# Patient Record
Sex: Male | Born: 1945
Health system: Southern US, Community
[De-identification: ages and names within clinical notes are randomized; demographics above are authoritative.]

## PROBLEM LIST (undated history)

## (undated) DIAGNOSIS — G4733 Obstructive sleep apnea (adult) (pediatric): Secondary | ICD-10-CM

## (undated) DIAGNOSIS — I480 Paroxysmal atrial fibrillation: Secondary | ICD-10-CM

## (undated) DIAGNOSIS — I340 Nonrheumatic mitral (valve) insufficiency: Secondary | ICD-10-CM

## (undated) DIAGNOSIS — I4821 Permanent atrial fibrillation: Secondary | ICD-10-CM

## (undated) DIAGNOSIS — I700 Atherosclerosis of aorta: Secondary | ICD-10-CM

## (undated) DIAGNOSIS — I428 Other cardiomyopathies: Secondary | ICD-10-CM

## (undated) DIAGNOSIS — I5042 Chronic combined systolic (congestive) and diastolic (congestive) heart failure: Secondary | ICD-10-CM

## (undated) DIAGNOSIS — I10 Essential (primary) hypertension: Secondary | ICD-10-CM

## (undated) DIAGNOSIS — I071 Rheumatic tricuspid insufficiency: Secondary | ICD-10-CM

## (undated) DIAGNOSIS — R569 Unspecified convulsions: Secondary | ICD-10-CM

## (undated) DIAGNOSIS — E785 Hyperlipidemia, unspecified: Secondary | ICD-10-CM

## (undated) DIAGNOSIS — I77810 Thoracic aortic ectasia: Secondary | ICD-10-CM

## (undated) DIAGNOSIS — Z9989 Dependence on other enabling machines and devices: Secondary | ICD-10-CM

## (undated) DIAGNOSIS — J189 Pneumonia, unspecified organism: Secondary | ICD-10-CM

## (undated) DIAGNOSIS — I4510 Unspecified right bundle-branch block: Secondary | ICD-10-CM

## (undated) DIAGNOSIS — Z9889 Other specified postprocedural states: Secondary | ICD-10-CM

## (undated) DIAGNOSIS — I219 Acute myocardial infarction, unspecified: Secondary | ICD-10-CM

## (undated) DIAGNOSIS — I5022 Chronic systolic (congestive) heart failure: Secondary | ICD-10-CM

## (undated) DIAGNOSIS — I429 Cardiomyopathy, unspecified: Secondary | ICD-10-CM

## (undated) HISTORY — PX: REPLACEMENT TOTAL KNEE: SUR1224

## (undated) HISTORY — PX: TONSILLECTOMY AND ADENOIDECTOMY: SUR1326

## (undated) HISTORY — PX: JOINT REPLACEMENT: SHX530

## (undated) HISTORY — PX: CIRCUMCISION: SUR203

## (undated) HISTORY — PX: PERCUTANEOUS PINNING TOE FRACTURE: SUR1018

---

## 1998-08-26 ENCOUNTER — Encounter: Payer: Self-pay | Admitting: Family Medicine

## 1998-08-26 ENCOUNTER — Ambulatory Visit (HOSPITAL_COMMUNITY): Admission: RE | Admit: 1998-08-26 | Discharge: 1998-08-26 | Payer: Self-pay | Admitting: Family Medicine

## 1999-03-30 ENCOUNTER — Ambulatory Visit: Admission: RE | Admit: 1999-03-30 | Discharge: 1999-03-30 | Payer: Self-pay | Admitting: Family Medicine

## 1999-05-30 ENCOUNTER — Ambulatory Visit: Admission: RE | Admit: 1999-05-30 | Discharge: 1999-05-30 | Payer: Self-pay | Admitting: Otolaryngology

## 2002-02-12 ENCOUNTER — Encounter: Payer: Self-pay | Admitting: Emergency Medicine

## 2002-02-13 ENCOUNTER — Inpatient Hospital Stay (HOSPITAL_COMMUNITY): Admission: EM | Admit: 2002-02-13 | Discharge: 2002-02-14 | Payer: Self-pay | Admitting: Emergency Medicine

## 2002-02-14 ENCOUNTER — Encounter: Payer: Self-pay | Admitting: Internal Medicine

## 2002-02-18 ENCOUNTER — Ambulatory Visit (HOSPITAL_COMMUNITY): Admission: RE | Admit: 2002-02-18 | Discharge: 2002-02-18 | Payer: Self-pay | Admitting: *Deleted

## 2008-12-27 ENCOUNTER — Emergency Department (HOSPITAL_COMMUNITY): Admission: EM | Admit: 2008-12-27 | Discharge: 2008-12-27 | Payer: Self-pay | Admitting: Emergency Medicine

## 2009-06-01 ENCOUNTER — Encounter: Admission: RE | Admit: 2009-06-01 | Discharge: 2009-06-01 | Payer: Self-pay | Admitting: Family Medicine

## 2009-11-19 ENCOUNTER — Encounter: Admission: RE | Admit: 2009-11-19 | Discharge: 2009-11-19 | Payer: Self-pay | Admitting: Orthopedic Surgery

## 2010-05-06 ENCOUNTER — Other Ambulatory Visit: Payer: Self-pay | Admitting: Orthopedic Surgery

## 2010-05-06 ENCOUNTER — Ambulatory Visit (HOSPITAL_COMMUNITY)
Admission: RE | Admit: 2010-05-06 | Discharge: 2010-05-06 | Disposition: A | Discharge status: Home / Self Care | Payer: Medicare Other | Source: Ambulatory Visit | Attending: Orthopedic Surgery | Admitting: Orthopedic Surgery

## 2010-05-06 ENCOUNTER — Encounter (HOSPITAL_COMMUNITY): Payer: Medicare Other

## 2010-05-06 ENCOUNTER — Other Ambulatory Visit (HOSPITAL_COMMUNITY): Payer: Self-pay | Admitting: Orthopedic Surgery

## 2010-05-06 DIAGNOSIS — Z01818 Encounter for other preprocedural examination: Secondary | ICD-10-CM

## 2010-05-06 LAB — CBC
HCT: 41.7 % (ref 39.0–52.0)
Hemoglobin: 14 g/dL (ref 13.0–17.0)
MCHC: 33.6 g/dL (ref 30.0–36.0)
Platelets: 231 10*3/uL (ref 150–400)
WBC: 7.7 10*3/uL (ref 4.0–10.5)

## 2010-05-06 LAB — COMPREHENSIVE METABOLIC PANEL
AST: 21 U/L (ref 0–37)
Creatinine, Ser: 0.86 mg/dL (ref 0.4–1.5)
GFR calc Af Amer: >60 mL/min (ref >60)
GFR calc non Af Amer: >60 mL/min (ref >60)
Glucose, Bld: 90 mg/dL (ref 70–99)
Sodium: 142 mEq/L (ref 135–145)

## 2010-05-06 LAB — URINALYSIS, ROUTINE W REFLEX MICROSCOPIC
Bilirubin Urine: NEGATIVE
Ketones, ur: NEGATIVE mg/dL
Protein, ur: NEGATIVE mg/dL
Urine Glucose, Fasting: NEGATIVE mg/dL
pH: 5.5 (ref 5.0–8.0)

## 2010-05-06 LAB — APTT: aPTT: 41 seconds — ABNORMAL HIGH (ref 24–37)

## 2010-05-06 LAB — DIFFERENTIAL
Basophils Absolute: 0 10*3/uL (ref 0.0–0.1)
Eosinophils Absolute: 0.2 10*3/uL (ref 0.0–0.7)
Eosinophils Relative: 3 % (ref 0–5)
Monocytes Absolute: 0.6 10*3/uL (ref 0.1–1.0)
Neutro Abs: 4.1 10*3/uL (ref 1.7–7.7)

## 2010-05-06 LAB — SURGICAL PCR SCREEN
MRSA, PCR: NEGATIVE
Staphylococcus aureus: POSITIVE — AB

## 2010-05-06 LAB — PROTIME-INR: INR: 1.16 (ref 0.00–1.49)

## 2010-05-11 ENCOUNTER — Inpatient Hospital Stay (HOSPITAL_COMMUNITY)
Admission: RE | Admit: 2010-05-11 | Discharge: 2010-05-14 | DRG: 470 | Disposition: A | Discharge status: Home Health | Payer: Medicare Other | Source: Ambulatory Visit | Attending: Orthopedic Surgery | Admitting: Orthopedic Surgery

## 2010-05-11 ENCOUNTER — Inpatient Hospital Stay (HOSPITAL_COMMUNITY): Payer: Medicare Other

## 2010-05-11 DIAGNOSIS — E785 Hyperlipidemia, unspecified: Secondary | ICD-10-CM | POA: Diagnosis present

## 2010-05-11 DIAGNOSIS — E669 Obesity, unspecified: Secondary | ICD-10-CM | POA: Diagnosis present

## 2010-05-11 DIAGNOSIS — M21169 Varus deformity, not elsewhere classified, unspecified knee: Secondary | ICD-10-CM | POA: Diagnosis present

## 2010-05-11 DIAGNOSIS — M1710 Unilateral primary osteoarthritis, unspecified knee: Principal | ICD-10-CM | POA: Diagnosis present

## 2010-05-11 DIAGNOSIS — Z79899 Other long term (current) drug therapy: Secondary | ICD-10-CM

## 2010-05-11 DIAGNOSIS — I2510 Atherosclerotic heart disease of native coronary artery without angina pectoris: Secondary | ICD-10-CM | POA: Diagnosis present

## 2010-05-11 DIAGNOSIS — G4733 Obstructive sleep apnea (adult) (pediatric): Secondary | ICD-10-CM | POA: Diagnosis present

## 2010-05-11 DIAGNOSIS — H547 Unspecified visual loss: Secondary | ICD-10-CM | POA: Diagnosis present

## 2010-05-11 DIAGNOSIS — I10 Essential (primary) hypertension: Secondary | ICD-10-CM | POA: Diagnosis present

## 2010-05-11 LAB — ABO/RH: ABO/RH(D): A POS

## 2010-05-11 LAB — TYPE AND SCREEN
ABO/RH(D): A POS
Antibody Screen: NEGATIVE

## 2010-05-12 LAB — HEMOGLOBIN AND HEMATOCRIT, BLOOD
HCT: 34.2 % — ABNORMAL LOW (ref 39.0–52.0)
Hemoglobin: 10.9 g/dL — ABNORMAL LOW (ref 13.0–17.0)

## 2010-05-12 NOTE — H&P (Addendum)
NAME:  Ruben Murray, Ruben Murray               ACCOUNT NO.:  [id number]  MEDICAL RECORD NO.:  [id number]           PATIENT TYPE:  I  LOCATION:  6712                         FACILITY:  [facility]  PHYSICIAN:  Kipp Brood. Alaylah Heatherington, M.D.DATE OF BIRTH:  10-22-1945  DATE OF ADMISSION:  05/11/2010 DATE OF DISCHARGE:                             HISTORY & PHYSICAL   CHIEF COMPLAINT:  Left knee pain.  BRIEF HISTORY:  Ruben Murray has been followed by Dr. Gladstone Lighter for worsening pain in his left knee.  He underwent Synvisc injections without relief and he feels that the pain and the dysfunction are worsening to a point that there are significantly limiting what he is able to do.  He now presents for left total knee arthroplasty.  The patient's primary care physician is Dr. Mariea Clonts and he has been cleared for surgery.  He also has underwent sleep study by Dr. Maxwell Caul.  He is not using a CPAP machine and he has been cleared for surgery.  MEDICATION ALLERGIES:  TEGRETOL.  CURRENT MEDICATIONS: 1. Phenobarbital. 2. Losartan/HCT. 3. Amlodipine. 4. Pravastatin. 5. Percocet. 6. Triamcinolone cream. 7. Multivitamin.  PAST MEDICAL HISTORY: 1. End-stage arthritis of the left knee. 2. History of seizure 16 years ago. 3. Impaired vision. 4. Dentures. 5. Sleep apnea.  The patient does use CPAP and will bring mask. 6. Hypertension. 7. Hyperlipidemia. 8. Arthritis. 9. Bursitis. 10.History of measles and mumps as a child.  PAST SURGICAL HISTORY: 1. Tonsillectomy in 1951. 2. Circumcision in 1964.  FAMILY MEDICAL HISTORY:  Father passed away at age of 54.  He had cardiac arrest.  Mother passed away at age of 17, she had a history of hypertension and hypothyroidism.  SOCIAL HISTORY:  The patient is married.  He is retired.  He admits past use of tobacco products, has not done so in several years.  He has 1 child.  He lives with his wife.  He does plan to go home after his hospital stay.  His wife lined up to be his  caregiver.  REVIEW OF SYSTEMS:  GENERAL:  Negative for fevers, chills, or weight change.  Positive for occasional fatigue.  HEENT/NEURO:  Positive for hearing loss, balance problems, and dentures.  DERMATOLOGIC:  Positive for occasional rashes.  RESPIRATORY:  Positive for shortness of breath on exertion.  Last chest x-ray was at May 06, 2010 at [facility] in preop.  CARDIOVASCULAR:  Positive for difficulty breathing while lying flat.  His last EKG was also May 06, 2010 at [facility] on preop.  GI:  Negative for nausea, vomiting, or diarrhea. GU: Positive for urinating at nighttime.  MUSCULOSKELETAL:  Positive for joint pain and back pain.  PHYSICAL EXAMINATION:  VITAL SIGNS:  Pulse 80, respirations 18, blood pressure 138/80 in the left arm. GENERAL:  Ruben Murray is alert and oriented x3, is well-developed, well- nourished, in no apparent distress.  He is a pleasant 65 year old male who is a stated height of 5 feet 11 inches and a stated weight of 276 pounds.  He is accompanied today by his wife. NECK:  Supple.  Full range of motion without  lymphadenopathy. CHEST:  Lungs are clear to auscultation bilaterally without wheezes, rhonchi, or rales. HEART:  Regular rate and rhythm without murmur. ABDOMEN:  Bowel sounds present in all 4 quadrants. EXTREMITIES:  Left knee:  Negative for effusion.  He has varus deformity, decreased range of motion, marked crepitus throughout the range of motion. SKIN:  Intact. NEUROLOGIC:  Intact. PERIPHERAL VASCULAR:  Carotid pulses 2+ bilaterally without bruit.  RADIOGRAPH:  Left knee, tricompartmental end-stage arthritis.  IMPRESSION:  End-stage arthritis of the left knee.  PLAN:  Left total knee arthroplasty to be performed by Dr. Gladstone Lighter.     Toniann Fail, PAC   ______________________________ Kipp Brood Gladstone Lighter, M.D.    LD/MEDQ  D:  05/11/2010  T:  05/12/2010  Job:  [id number]  Electronically Signed by Toniann Fail  on  05/12/2010 07:38:54 AM Electronically Signed by Latanya Maudlin M.D. on 05/18/2010 08:29:33 AM

## 2010-05-13 LAB — HEMOGLOBIN AND HEMATOCRIT, BLOOD: Hemoglobin: 10.5 g/dL — ABNORMAL LOW (ref 13.0–17.0)

## 2010-05-14 LAB — HEMOGLOBIN AND HEMATOCRIT, BLOOD: HCT: 29.6 % — ABNORMAL LOW (ref 39.0–52.0)

## 2010-05-18 NOTE — Op Note (Signed)
NAME:  Ruben Murray, Ruben Murray NO.:  [id number]  MEDICAL RECORD NO.:  [id number]           PATIENT TYPE:  O  LOCATION:  XRAY                         FACILITY:  [facility]  PHYSICIAN:  Kipp Brood. Romond Pipkins, M.D.DATE OF BIRTH:  1945-04-04  DATE OF PROCEDURE:  05/11/2010 DATE OF DISCHARGE:  05/06/2010                              OPERATIVE REPORT   Mr. Ruben Murray was taken to surgery today on 05/11/2010.  PREOPERATIVE DIAGNOSIS:  Severe degenerative arthritis of the left knee with a varus deformity.  POSTOPERATIVE DIAGNOSIS:  Severe degenerative arthritis of the left knee with a varus deformity.  SURGEON:  Kipp Brood. Gladstone Lighter, M.D.  ASSISTANT:  Toniann Fail, PA-C  COMPONENTS USED:  We utilized the [employer] system.  All three components were cemented and the sizes used were as follows:  The patella was size 41 with 3 pegged tibial insert with a size 5 to 10-mm thickness tibial tray with a size 4 cemented, the femoral component was a size 5 posterior stabilized femoral component and gentamicin was used in the cement.  PROCEDURE IN DETAIL:  Under general anesthesia, routine orthopedic prep and draping of the left lower extremity was carried out.  He had 2 grams of IV Ancef.  At this time, the appropriate time-out was carried out. Prior to that, I marked the appropriate left leg in the holding area. After the prep and draping, I exsanguinated the leg.  The leg with the Esmarch and elevated the tourniquet to 350 mmHg.  The patient was placed in a [facility].  At this time with the knee flexed, incision was made over the anterior aspect of the flexed knee.  Bleeders were identified and cauterized and two flaps were created.  I then carried out a median parapatellar incision reflecting patella laterally.  At this time, I did medial and lateral meniscectomies.  I did a nice release of the medial corner because of the severe genu varus and arthritic changes.  I did a  medial and lateral meniscectomy and I excised the anterior and posterior cruciate ligaments.  I also did a synovectomy.  At this time, a drill hole was made in the intercondylar notch.  The canal finder then was inserted.  I thoroughly irrigated out the canal.  I then removed with an ex-jig 12 mm thickness off the distal femur.  Following that, I measured the femur to be a size 5.  I then carried out anterior-posterior chamfering cuts in the usual fashion. Great care was taken to protect the collateral ligaments and to be careful in regards to the popliteal space.  After that, I then measured the tibia tray to be a size 5.  Initial drill hole was made in the femur and the tibial plateau.  The canal finder was inserted.  I thoroughly irrigated out the canal.  After this, I then inserted my guide and removed approximately 8 mm thickness off the affected medial side of the tibia.  We had quite a collapse of that joint space on that side.  After this was done, I then made sure we had a nice soft tissue  release with spacer blocks were inserted.  We had excellent tension in flexion and extension with the 10 mm thickness insert.  Following that, I then cut my keel cut in the usual fashion on the proximal tibia followed by my notch cut out of the distal femur.  Thoroughly irrigated out the area and then inserted my trial components, which were size 5 right femoral component and a size 5  tibial tray with a 10-mm thickness insert.  We had good stability, good flexion and extension.  The knee was held in extension with the inserts in place and I then did my resurfacing procedure on the patella for a size 41-mm patella.  I then made 3 drill holes in the articular surface of the patella.  Note, all components were removed.  I thoroughly water picked the knee out, dried the knee out and then cemented all 3 components in simultaneously.  I then removed all loose pieces of the cement.  We checked  posteriorly to make sure there were no more cement particles noted.  I thoroughly water- picked the area out and then after that, I injected 10 mL of Surgiflo in the soft tissue areas.  I then inserted my permanent 10-mm thickness rotating platform size 5 and reduced the knee and had good stability in the mediolateral planes and good flexion and extension.  I then inserted my Hemovac drain and I closed the capsule, after that, the tourniquet was released.  We had good maintenance of hemostasis.  I then closed the remaining part of the wound in usual fashion.  Skin was closed with metal staples.  Sterile Neosporin dressing was applied.          ______________________________ Kipp Brood Gladstone Lighter, M.D.     RAG/MEDQ  D:  05/11/2010  T:  05/11/2010  Job:  [id number]  cc:   Herbie Baltimore A. Alyson Ingles, M.D. Fax: [phone]  Nehemiah Settle, M.D. Fax: [phone]  Electronically Signed by Latanya Maudlin M.D. on 05/18/2010 08:29:30 AM

## 2010-06-07 ENCOUNTER — Ambulatory Visit: Payer: Medicare Other | Attending: Orthopedic Surgery | Admitting: Physical Therapy

## 2010-06-07 DIAGNOSIS — IMO0001 Reserved for inherently not codable concepts without codable children: Secondary | ICD-10-CM | POA: Insufficient documentation

## 2010-06-07 DIAGNOSIS — M25569 Pain in unspecified knee: Secondary | ICD-10-CM | POA: Insufficient documentation

## 2010-06-07 DIAGNOSIS — M25669 Stiffness of unspecified knee, not elsewhere classified: Secondary | ICD-10-CM | POA: Insufficient documentation

## 2010-06-07 DIAGNOSIS — M6281 Muscle weakness (generalized): Secondary | ICD-10-CM | POA: Insufficient documentation

## 2010-06-07 DIAGNOSIS — R262 Difficulty in walking, not elsewhere classified: Secondary | ICD-10-CM | POA: Insufficient documentation

## 2010-06-09 LAB — DIFFERENTIAL
Basophils Absolute: 0 10*3/uL (ref 0.0–0.1)
Eosinophils Relative: 0 % (ref 0–5)
Lymphocytes Relative: 12 % (ref 12–46)
Monocytes Relative: 6 % (ref 3–12)
Neutrophils Relative %: 82 % — ABNORMAL HIGH (ref 43–77)

## 2010-06-09 LAB — CBC
Hemoglobin: 14.5 g/dL (ref 13.0–17.0)
MCHC: 33.7 g/dL (ref 30.0–36.0)
MCV: 94.2 fL (ref 78.0–100.0)
Platelets: 202 10*3/uL (ref 150–400)
RBC: 4.57 MIL/uL (ref 4.22–5.81)

## 2010-06-09 LAB — COMPREHENSIVE METABOLIC PANEL
ALT: 26 U/L (ref 0–53)
CO2: 29 mEq/L (ref 19–32)
Chloride: 103 mEq/L (ref 96–112)
GFR calc non Af Amer: >60 mL/min (ref >60)
Glucose, Bld: 126 mg/dL — ABNORMAL HIGH (ref 70–99)

## 2010-06-09 LAB — POCT CARDIAC MARKERS
Myoglobin, poc: 62.1 ng/mL (ref 12–200)
Troponin i, poc: <0.05 ng/mL (ref 0.00–0.09)

## 2010-06-09 LAB — URINALYSIS, ROUTINE W REFLEX MICROSCOPIC
Bilirubin Urine: NEGATIVE
Ketones, ur: NEGATIVE mg/dL
Protein, ur: NEGATIVE mg/dL
Specific Gravity, Urine: 1.022 (ref 1.005–1.030)
pH: 7 (ref 5.0–8.0)

## 2010-06-09 LAB — RAPID STREP SCREEN (MED CTR MEBANE ONLY): Streptococcus, Group A Screen (Direct): NEGATIVE

## 2010-06-14 ENCOUNTER — Ambulatory Visit: Payer: Medicare Other | Admitting: Physical Therapy

## 2010-06-17 ENCOUNTER — Ambulatory Visit: Payer: Medicare Other | Attending: Orthopedic Surgery | Admitting: Physical Therapy

## 2010-06-17 DIAGNOSIS — R262 Difficulty in walking, not elsewhere classified: Secondary | ICD-10-CM | POA: Insufficient documentation

## 2010-06-17 DIAGNOSIS — M25669 Stiffness of unspecified knee, not elsewhere classified: Secondary | ICD-10-CM | POA: Insufficient documentation

## 2010-06-17 DIAGNOSIS — M25569 Pain in unspecified knee: Secondary | ICD-10-CM | POA: Insufficient documentation

## 2010-06-17 DIAGNOSIS — IMO0001 Reserved for inherently not codable concepts without codable children: Secondary | ICD-10-CM | POA: Insufficient documentation

## 2010-06-17 DIAGNOSIS — M6281 Muscle weakness (generalized): Secondary | ICD-10-CM | POA: Insufficient documentation

## 2010-06-21 ENCOUNTER — Ambulatory Visit: Payer: Medicare Other | Admitting: Physical Therapy

## 2010-06-24 ENCOUNTER — Ambulatory Visit: Payer: Medicare Other | Admitting: Physical Therapy

## 2010-06-29 ENCOUNTER — Ambulatory Visit: Payer: Medicare Other | Admitting: Physical Therapy

## 2010-07-05 ENCOUNTER — Ambulatory Visit: Payer: Medicare Other | Attending: Orthopedic Surgery | Admitting: Physical Therapy

## 2010-07-05 DIAGNOSIS — M25669 Stiffness of unspecified knee, not elsewhere classified: Secondary | ICD-10-CM | POA: Insufficient documentation

## 2010-07-05 DIAGNOSIS — M6281 Muscle weakness (generalized): Secondary | ICD-10-CM | POA: Insufficient documentation

## 2010-07-05 DIAGNOSIS — IMO0001 Reserved for inherently not codable concepts without codable children: Secondary | ICD-10-CM | POA: Insufficient documentation

## 2010-07-05 DIAGNOSIS — R262 Difficulty in walking, not elsewhere classified: Secondary | ICD-10-CM | POA: Insufficient documentation

## 2010-07-05 DIAGNOSIS — M25569 Pain in unspecified knee: Secondary | ICD-10-CM | POA: Insufficient documentation

## 2010-07-08 ENCOUNTER — Ambulatory Visit: Payer: Medicare Other | Admitting: Physical Therapy

## 2010-07-12 ENCOUNTER — Ambulatory Visit: Payer: Medicare Other | Admitting: Physical Therapy

## 2010-07-15 ENCOUNTER — Ambulatory Visit: Payer: Medicare Other | Admitting: Physical Therapy

## 2010-07-19 ENCOUNTER — Encounter: Payer: Medicare Other | Admitting: Physical Therapy

## 2010-07-22 ENCOUNTER — Encounter: Payer: Medicare Other | Admitting: Physical Therapy

## 2010-07-22 NOTE — Cardiovascular Report (Signed)
NAME:  Ruben Murray, Ruben Murray                         ACCOUNT NO.:  [id number]   MEDICAL RECORD NO.:  [id number]                   PATIENT TYPE:  OIB   LOCATION:  2864                                 FACILITY:  [facility]   PHYSICIAN:  Fabio Asa, M.D.                 DATE OF BIRTH:  March 11, 1945   DATE OF PROCEDURE:  DATE OF DISCHARGE:                              CARDIAC CATHETERIZATION   INDICATIONS FOR PROCEDURE:  Decreased uptake in the inferior wall.  Ejection  fraction 43%.  Ongoing chest pain.   DESCRIPTION OF PROCEDURE:  After obtaining written informed consent, the  patient was brought to the cardiac catheterization laboratory in the  postabsorptive state. Preoperative sedation was achieved using IV Versed.  The right groin was prepped and draped in the usual sterile fashion. Local  anesthesia was achieved using 1% Xylocaine. A 6 French hemostasis sheath was  placed into the right femoral artery using the modified Seldinger technique.  Selective coronary angiography was performed using a JL4 and a non torque  right.  There was initial spasm of the right coronary artery with a JR4.  Intracoronary nitroglycerin was given and the catheter was switched to a non  torque right.  All catheter exchange were made over a guide wire.  The  hemostasis sheath was flushed following each engagement.   FINDINGS:  The aortic pressure is 125/74, LV pressure was 133/10.  Single  plane ventriculogram revealed normal wall motion, ejection fraction of 60%.  There was no mitral regurgitation noted.   CORONARY ANGIOGRAPHY:  Left main coronary artery:  The left main coronary artery bifurcates into  the left anterior descending and circumflex vessel.  There is no disease in  the left main coronary artery.   Left anterior descending:  The left anterior descending is a large artery  giving rise to a small diagonal #1, larger diagonal #2 and going on to end  as a large apical recurrent branch.  There is no  disease in the left  anterior descending or its branches.   Circumflex vessel:  The circumflex is a large codominant artery giving rise  to three obtuse marginals.  There is no disease in the circumflex or its  branches.   Right coronary artery:  The right coronary artery is codominant, gives rise  to two RV marginals and a PDA.  There was no significant disease in the  right coronary artery or its branches.    FINAL IMPRESSION:  1. Normal coronary angiography, normal single plane ventriculogram.  2. False-positive stress Cardiolite.   RECOMMENDATIONS:  To consider other etiologies for his chest pain.                                                   Bonnita Nasuti  Jaci Standard, M.D.    HP/MEDQ  D:  02/18/2002  T:  02/18/2002  Job:  [id number]

## 2010-07-22 NOTE — Consult Note (Signed)
NAME:  Ruben Murray, Ruben Murray                         ACCOUNT NO.:  [id number]   MEDICAL RECORD NO.:  [id number]                   PATIENT TYPE:  INP   LOCATION:  6529                                 FACILITY:  [facility]   PHYSICIAN:  Fabio Asa, M.D.                 DATE OF BIRTH:  [birth year]   DATE OF CONSULTATION:  02/13/2002  DATE OF DISCHARGE:                                   CONSULTATION   REASON FOR CONSULTATION:  Chest pain.   IMPRESSION:  Chest pain which has typical and atypical features in a 65-year-  old obese male with risk factors including family history, sedentary  lifestyle, cholesterol status unknown, remote history of tobacco use,  stopped smoking five years prior.   RECOMMENDATIONS:  Will obtain serial cardiac enzymes.  His EKG reveals  nonspecific ST changes.  He has continued residual chest soreness,  therefore, will arrange for an inpatient stress Cardiolite.   HISTORY OF PRESENT ILLNESS:  The patient is a 65 year old morbidly obese  male weighing 245 pounds with history of seizure disorder and remote history  of tobacco abuse, who presents with complaints of a sharp, jabbing chest  pain 10/10 sensation over the right anterior chest wall in church associated  with dyspnea.  This persisted for approximately five minutes.  The pain  radiated to the right subscapular region and + or - dizziness.  His wife  noted that he looked pale and clammy.  He subsequently went to [facility] ER.  He then developed a new left anterior chest pressure described  as 5/10, unsure of the duration, but states that one sublingual  nitroglycerin resolved the discomfort.  He continues to have a residual  bilateral chest discomfort.  He wonders if it may be secondary to upper  respiratory tract infection.  He has had three sets of cardiac enzymes  performed, both of which are negative, but his troponin I initially was  0.02.   PAST MEDICAL HISTORY:  Significant for seizure  disorder.  He has had no  seizures in the past six or seven years.  Sleep apnea, diagnosed with sleep  apnea.  He was intolerant to CPAP. Does not wish for reevaluation.  Obesity,  weight gain of 80 pounds over the past eight years. He contributes this to  lack of exercise.  Bursitis.  Sinusitis.   PAST SURGICAL HISTORY:  Significant for tonsillectomy.   ALLERGIES:  TEGRETOL which results in hives.   MEDICATIONS:  1. Phenobarbital 100 mg daily.  2. Over-the-counter vitamins.   SOCIAL HISTORY:  Three packs a day for 40 years, stopped five years prior.  Married for eight years, one daughter.   FAMILY HISTORY:  Father passed away at 67 from myocardial infarction.  Mother is 66, alive and well. One brother and one sister who are alive and  well.   REVIEW OF SYSTEMS:  He has episodic dizziness.  He wears bifocals.  Otherwise negative except for musculoskeletal change.  He has had nasal  congestion and some mild throat pain.   PHYSICAL EXAMINATION:  VITAL SIGNS:  Temperature afebrile, blood pressure  120/65, O2 saturations 97% on room air.  GENERAL:  He is an obese, very pleasant male in no acute distress.  HEENT:  Unremarkable, good carotid upstrokes, no carotid bruits. No neck  vein distention.  LUNGS:  Breath sounds which are equal and clear to auscultation.  No use of  accessory muscles.  HEART:  Regular rate and rhythm, normal S1, normal S2, no murmurs, rubs, or  gallops noted.  ABDOMEN:  Obese, normal bowel sounds.  Examination made difficult by body  habitus.  EXTREMITIES:  No edema, his distal pulses are palpable.  NEUROLOGY:  Nonfocal.   LABORATORY DATA:  White count 8.2, hematocrit 40.3, INR 1.0, normal  electrolytes.  Initial CK 128, second CK 102.  Chest x-ray reveals no acute  disease.  EKG, his initial EKG revealed a normal sinus rhythm.  He had  upright Q waves in the inferior and lateral leads.  His EKG on December 11,  revealed flattening of the T waves in his  inferior lateral leads.   IMPRESSION:  As noted above.                                               Fabio Asa, M.D.    HP/MEDQ  D:  02/13/2002  T:  02/13/2002  Job:  [id number]   cc:   Herbie Baltimore A. Alyson Ingles, M.D.  [address]  [place] [address]  Fax: [phone]

## 2010-07-22 NOTE — Discharge Summary (Signed)
NAME:  Ruben Murray, Ruben Murray                         ACCOUNT NO.:  [id number]   MEDICAL RECORD NO.:  [id number]                   PATIENT TYPE:  INP   LOCATION:  6529                                 FACILITY:  [facility]   PHYSICIAN:  Judithann Graves, M.D.        DATE OF BIRTH:  [birth year]   DATE OF ADMISSION:  02/12/2002  DATE OF DISCHARGE:  02/14/2002                                 DISCHARGE SUMMARY   DISCHARGE DIAGNOSES:  1. Noncardiac chest pain.  2. Seizure disorder.  3. Gastroesophageal reflux disease.   BRIEF HISTORY OF PRESENT ILLNESS:  Ruben Murray presented to the emergency room  with substernal chest pain, which radiated to the back, which began in choir  practice.  He did have some associated diaphoresis and mild nausea and  dizziness.  The pain initially was a right-sided sharp stabbing chest pain;  however, he then began to have a left-sided heaviness which persisted and he  was brought to the emergency room for evaluation.  He was given sublingual  nitroglycerin for this which relieved the pain almost completely.   HOSPITAL COURSE:  He was admitted to rule out myocardial infarction which  was subsequently ruled out with serial negative enzymes and he remained  essentially pain free throughout his hospital course.  His EKG was  remarkable for normal sinus rhythm, which there was some initial flattening  of his T waves on the inferior lateral leads.  The patient did well.  He was  seen in consultation by Dr. Jaci Standard who felt that he should have a stress  test prior to discharge.  This Cardiolite stress test was accomplished on  December 12, the results of which were negative for cardiac ischemia.  The  patient was then discharged home in stable condition with the likely  diagnosis of GERD as his chest pain did resolve with the Protonix therapy.   DISCHARGE PLAN:  1. The patient is to follow up with Dr. Alyson Ingles in two to three weeks for     follow up of his gastroesophageal  reflux disease.  2. He is to call immediately if he has any return in chest pain or shortness     of breath.  If he has any recurrent chest pain, he is to return to the     emergency room.  In addition, he is to page Violeta Gelinas, N.P., on Monday     morning for the final results of his Cardiolite stress test as the     lateral portion was not finished by the time of discharge although Dr.     Jaci Standard felt that he was stable to go home.  He had an appointment on     December 16 with Dr. Jaci Standard.   DISCHARGE MEDICATIONS:  1. Aspirin 81 mg p.o. q.d.  2.     Protonix 40 mg p.o. q.d.  3. Phenobarbital 100 mg p.o. q.d.  4. Nitroglycerin 0.4 mg sublingual  p.r.n. chest pain.                                               Judithann Graves, M.D.    AMC/MEDQ  D:  02/14/2002  T:  02/16/2002  Job:  [id number]   cc:   Herbie Baltimore A. Alyson Ingles, M.D.  [address]  [place]  [place] [address]  Fax: [phone]   Fabio Asa, M.D.  [place]  [place] [address]  Fax: [phone]

## 2011-06-23 ENCOUNTER — Emergency Department (HOSPITAL_COMMUNITY)
Admission: EM | Admit: 2011-06-23 | Discharge: 2011-06-23 | Disposition: A | Discharge status: Home / Self Care | Payer: Medicare Other | Attending: Emergency Medicine | Admitting: Emergency Medicine

## 2011-06-23 ENCOUNTER — Encounter (HOSPITAL_COMMUNITY): Payer: Self-pay | Admitting: *Deleted

## 2011-06-23 DIAGNOSIS — H18839 Recurrent erosion of cornea, unspecified eye: Secondary | ICD-10-CM | POA: Diagnosis present

## 2011-06-23 DIAGNOSIS — H11429 Conjunctival edema, unspecified eye: Secondary | ICD-10-CM | POA: Insufficient documentation

## 2011-06-23 DIAGNOSIS — H16009 Unspecified corneal ulcer, unspecified eye: Secondary | ICD-10-CM | POA: Insufficient documentation

## 2011-06-23 HISTORY — DX: Recurrent erosion of cornea, unspecified eye: H18.839

## 2011-06-23 MED ORDER — FLUORESCEIN SODIUM 1 MG OP STRP
1.0000 | ORAL_STRIP | Freq: Once | OPHTHALMIC | Status: AC
  Administered 2011-06-23: 2 via OPHTHALMIC
  Filled 2011-06-23 (×2): qty 1

## 2011-06-23 MED ORDER — TETRACAINE HCL 0.5 % OP SOLN
2.0000 [drp] | Freq: Once | OPHTHALMIC | Status: AC
  Administered 2011-06-23: 2 [drp] via OPHTHALMIC
  Filled 2011-06-23: qty 2

## 2011-06-23 MED ORDER — HYDROCODONE-ACETAMINOPHEN 5-500 MG PO TABS: 1.0000 | ORAL_TABLET | Freq: Four times a day (QID) | ORAL | Status: AC | PRN

## 2011-06-23 NOTE — Discharge Instructions (Signed)
Corneal Ulcer You have an eye infection with an ulcer on the cornea. This condition may be caused by viruses, bacteria, or other forms of infection (like fungus). Ulcers may scar the cornea and cause permanent damage to the eye. CAUSES   Infection.   Eye injuries.   Constant irritation from foreign bodies in the eye including contact lenses.  SYMPTOMS   Pain in the eye.   Vision gets worse.   Light sensitivity.   Yellowish white fluid (pus) from the eye.  DIAGNOSIS  A corneal ulcer is diagnosed by your eye doctor using a special kind of microscope to look at the cornea. Your doctor may have to take tissue samples and cultures from the surface of the eye, or from within the ulcer, in order to be sure what caused your ulcer. This is done painlessly in the office with eye drops used to numb (anesthetize) the surface of the eye. TREATMENT   Eye drops or ointment medicine to treat the infection may be needed.   Pain medicine to enlarge (dilate) the black circle (pupil) of your eye may be needed.   A soft contact lens may be placed over your cornea (as approved by your eye doctor) to protect the cornea and advance healing.   In some cases, medicine may be injected under the thin membrane covering the eyeball and underside of the eyelids (conjunctiva) to be sure that the medicine gets to the ulcer in high enough doses.   Eye patching to reduce irritation from blinking and bright light may be needed.  HOME CARE INSTRUCTIONS   If your eye is patched, you should not drive or use machinery. You will have reduced side vision and ability to judge distance.   Do not rub your eye. This may increase the irritation and spread the infection.   Stay in a dark room and use sunglasses to reduce light sensitivity.   Do not wear contact lenses until your eye doctor (ophthalmoloist) approves.   Follow up with your eye doctor as directed. This is necessary to be sure the corneal ulcer is healing.    SEEK IMMEDIATE MEDICAL CARE IF:   You develop vision loss.   You have increasing pain or discharge from the eye.   There is more pus in, or around, either eye.  Document Released: 03/30/2004 Document Revised: 02/09/2011 Document Reviewed: 07/09/2009 [facility] Patient Information 2012 [place].

## 2011-06-23 NOTE — ED Provider Notes (Signed)
History     CSN: [id number]  Arrival date & time 06/23/11  1698   First MD Initiated Contact with Patient 06/23/11 [phone]      Chief Complaint  Patient presents with  . Eye Problem    (Consider location/radiation/quality/duration/timing/severity/associated sxs/prior treatment) Patient is a 66 y.o. male presenting with eye problem. The history is provided by the patient and the spouse. No language interpreter was used.  Eye Problem  This is a new problem. The current episode started yesterday. The problem occurs constantly. The problem has been gradually worsening. There is pain in the right eye. There was no injury mechanism. The pain is at a severity of 10/10. The pain is severe. There is history of trauma to the eye. There is no known exposure to pink eye. He does not wear contacts. Associated symptoms include photophobia, eye redness and itching. Pertinent negatives include no blurred vision, no decreased vision, no discharge, no double vision, no tingling and no weakness. He has tried nothing for the symptoms. The treatment provided no relief.    History reviewed. No pertinent past medical history.  History reviewed. No pertinent past surgical history.  History reviewed. No pertinent family history.  History  Substance Use Topics  . Smoking status: Never Smoker   . Smokeless tobacco: Not on file  . Alcohol Use: Yes      Review of Systems  Constitutional: Negative for fever.  HENT: Negative for neck pain.   Eyes: Positive for photophobia and redness. Negative for blurred vision, double vision and discharge.  Skin: Positive for itching. Negative for rash.  Neurological: Negative for tingling and weakness.  All other systems reviewed and are negative.    Allergies  Dilaudid and Tegretol  Home Medications   Current Outpatient Rx  Name Route Sig Dispense Refill  . AMLODIPINE BESYLATE 5 MG PO TABS Oral Take 5 mg by mouth every morning.    . ASPIRIN EC 81 MG PO TBEC  Oral Take 81 mg by mouth daily.    Marland Kitchen LOSARTAN POTASSIUM-HCTZ 100-12.5 MG PO TABS Oral Take 1 tablet by mouth every morning.    . ADULT MULTIVITAMIN W/MINERALS CH Oral Take 1 tablet by mouth every morning.    Marland Kitchen PHENOBARBITAL 97.2 MG PO TABS Oral Take 97.2 mg by mouth every morning.      BP 141/82  Pulse 91  Temp(Src) 98 F (36.7 C) (Oral)  Resp 20  SpO2 94%  Physical Exam  Constitutional: He is oriented to person, place, and time. He appears well-developed and well-nourished.  HENT:  Head: Normocephalic and atraumatic.  Mouth/Throat: Oropharynx is clear and moist.  Eyes: EOM are normal. Pupils are equal, round, and reactive to light. Right eye exhibits no chemosis and no discharge. Left eye exhibits chemosis. Left eye exhibits no discharge. Right conjunctiva is injected. Left conjunctiva is injected.    Cardiovascular: Normal rate and regular rhythm.   Pulmonary/Chest: Effort normal and breath sounds normal.  Abdominal: Soft. Bowel sounds are normal.  Musculoskeletal: Normal range of motion.  Neurological: He is alert and oriented to person, place, and time.  Skin: Skin is warm and dry.  Psychiatric: He has a normal mood and affect.    ED Course  Procedures (including critical care time)  Labs Reviewed - No data to display No results found.   No diagnosis found.    MDM  Case d/w Eliseo Squires of [place] follow up at 730 no drops they will decide  Carlisle Beers, MD 06/23/11 [phone]

## 2011-06-23 NOTE — ED Notes (Signed)
Patient with right eye irritation and redness with drainage.  Patient unable to open the eye due to irritation

## 2012-02-14 ENCOUNTER — Ambulatory Visit: Payer: Medicare Other | Attending: Orthopedic Surgery

## 2012-02-14 DIAGNOSIS — M25673 Stiffness of unspecified ankle, not elsewhere classified: Secondary | ICD-10-CM | POA: Insufficient documentation

## 2012-02-14 DIAGNOSIS — M25579 Pain in unspecified ankle and joints of unspecified foot: Secondary | ICD-10-CM | POA: Insufficient documentation

## 2012-02-14 DIAGNOSIS — R262 Difficulty in walking, not elsewhere classified: Secondary | ICD-10-CM | POA: Insufficient documentation

## 2012-02-14 DIAGNOSIS — IMO0001 Reserved for inherently not codable concepts without codable children: Secondary | ICD-10-CM | POA: Insufficient documentation

## 2012-02-14 DIAGNOSIS — M25676 Stiffness of unspecified foot, not elsewhere classified: Secondary | ICD-10-CM | POA: Insufficient documentation

## 2012-02-20 ENCOUNTER — Ambulatory Visit: Payer: Medicare Other | Admitting: Physical Therapy

## 2012-02-23 ENCOUNTER — Ambulatory Visit: Payer: Medicare Other | Admitting: Rehabilitation

## 2012-02-26 ENCOUNTER — Ambulatory Visit: Payer: Medicare Other | Admitting: Rehabilitation

## 2012-02-29 ENCOUNTER — Ambulatory Visit: Payer: Medicare Other | Admitting: Rehabilitation

## 2012-03-05 ENCOUNTER — Ambulatory Visit: Payer: Medicare Other | Admitting: Rehabilitation

## 2012-03-07 ENCOUNTER — Ambulatory Visit: Payer: Medicare Other | Attending: Orthopedic Surgery | Admitting: Rehabilitation

## 2012-03-07 DIAGNOSIS — R262 Difficulty in walking, not elsewhere classified: Secondary | ICD-10-CM | POA: Insufficient documentation

## 2012-03-07 DIAGNOSIS — M25676 Stiffness of unspecified foot, not elsewhere classified: Secondary | ICD-10-CM | POA: Insufficient documentation

## 2012-03-07 DIAGNOSIS — M25673 Stiffness of unspecified ankle, not elsewhere classified: Secondary | ICD-10-CM | POA: Insufficient documentation

## 2012-03-07 DIAGNOSIS — IMO0001 Reserved for inherently not codable concepts without codable children: Secondary | ICD-10-CM | POA: Insufficient documentation

## 2012-03-07 DIAGNOSIS — M25579 Pain in unspecified ankle and joints of unspecified foot: Secondary | ICD-10-CM | POA: Insufficient documentation

## 2012-03-11 ENCOUNTER — Ambulatory Visit: Payer: Medicare Other

## 2012-03-13 ENCOUNTER — Ambulatory Visit: Payer: Medicare Other | Admitting: Rehabilitation

## 2012-03-18 ENCOUNTER — Ambulatory Visit: Payer: Medicare Other | Admitting: Rehabilitation

## 2012-03-20 ENCOUNTER — Ambulatory Visit: Payer: Medicare Other | Admitting: Rehabilitation

## 2012-03-26 ENCOUNTER — Ambulatory Visit: Payer: Medicare Other | Admitting: Rehabilitation

## 2012-03-28 ENCOUNTER — Encounter: Payer: Medicare Other | Admitting: Rehabilitation

## 2012-03-28 ENCOUNTER — Ambulatory Visit: Payer: Medicare Other

## 2012-12-26 ENCOUNTER — Encounter (HOSPITAL_COMMUNITY): Payer: Self-pay | Admitting: Emergency Medicine

## 2012-12-26 ENCOUNTER — Emergency Department (HOSPITAL_COMMUNITY): Payer: Medicare Other

## 2012-12-26 ENCOUNTER — Emergency Department (HOSPITAL_COMMUNITY)
Admission: EM | Admit: 2012-12-26 | Discharge: 2012-12-26 | Disposition: A | Discharge status: Home / Self Care | Payer: Medicare Other | Attending: Emergency Medicine | Admitting: Emergency Medicine

## 2012-12-26 DIAGNOSIS — Z87891 Personal history of nicotine dependence: Secondary | ICD-10-CM | POA: Insufficient documentation

## 2012-12-26 DIAGNOSIS — I10 Essential (primary) hypertension: Secondary | ICD-10-CM | POA: Insufficient documentation

## 2012-12-26 DIAGNOSIS — Z79899 Other long term (current) drug therapy: Secondary | ICD-10-CM | POA: Insufficient documentation

## 2012-12-26 DIAGNOSIS — Z7982 Long term (current) use of aspirin: Secondary | ICD-10-CM | POA: Insufficient documentation

## 2012-12-26 DIAGNOSIS — J209 Acute bronchitis, unspecified: Secondary | ICD-10-CM | POA: Insufficient documentation

## 2012-12-26 HISTORY — DX: Essential (primary) hypertension: I10

## 2012-12-26 LAB — POCT I-STAT TROPONIN I: Troponin i, poc: 0.01 ng/mL (ref 0.00–0.08)

## 2012-12-26 LAB — CBC
Hemoglobin: 14 g/dL (ref 13.0–17.0)
MCH: 31.3 pg (ref 26.0–34.0)
Platelets: 217 10*3/uL (ref 150–400)
RBC: 4.47 MIL/uL (ref 4.22–5.81)
WBC: 13.9 10*3/uL — ABNORMAL HIGH (ref 4.0–10.5)

## 2012-12-26 LAB — PRO B NATRIURETIC PEPTIDE: Pro B Natriuretic peptide (BNP): 32.1 pg/mL (ref 0–125)

## 2012-12-26 LAB — BASIC METABOLIC PANEL
CO2: 25 mEq/L (ref 19–32)
Calcium: 8.7 mg/dL (ref 8.4–10.5)
Glucose, Bld: 152 mg/dL — ABNORMAL HIGH (ref 70–99)
Potassium: 3.4 mEq/L — ABNORMAL LOW (ref 3.5–5.1)
Sodium: 137 mEq/L (ref 135–145)

## 2012-12-26 MED ORDER — ALBUTEROL SULFATE HFA 108 (90 BASE) MCG/ACT IN AERS: 2.0000 | INHALATION_SPRAY | RESPIRATORY_TRACT | Status: DC | PRN

## 2012-12-26 MED ORDER — PREDNISONE 20 MG PO TABS
60.0000 mg | ORAL_TABLET | Freq: Once | ORAL | Status: AC
  Administered 2012-12-26: 60 mg via ORAL
  Filled 2012-12-26: qty 3

## 2012-12-26 MED ORDER — IPRATROPIUM BROMIDE 0.02 % IN SOLN
0.5000 mg | Freq: Once | RESPIRATORY_TRACT | Status: AC
  Administered 2012-12-26: 0.5 mg via RESPIRATORY_TRACT
  Filled 2012-12-26: qty 2.5

## 2012-12-26 MED ORDER — ALBUTEROL SULFATE (5 MG/ML) 0.5% IN NEBU
2.5000 mg | INHALATION_SOLUTION | Freq: Once | RESPIRATORY_TRACT | Status: AC
  Administered 2012-12-26: 2.5 mg via RESPIRATORY_TRACT
  Filled 2012-12-26: qty 0.5

## 2012-12-26 MED ORDER — PREDNISONE 50 MG PO TABS: 50.0000 mg | ORAL_TABLET | Freq: Every day | ORAL | Status: DC

## 2012-12-26 NOTE — ED Notes (Signed)
Pt states non radiating chest tightness, with diaphoresis, plus unproductive cough x 1 week, with SOB

## 2012-12-26 NOTE — ED Provider Notes (Signed)
CSN: [id number]     Arrival date & time 12/26/12  0404 History   First MD Initiated Contact with Patient 12/26/12 0500     Chief Complaint  Patient presents with  . Chest Pain   (Consider location/radiation/quality/duration/timing/severity/associated sxs/prior Treatment) Patient is a 67 y.o. male presenting with chest pain. The history is provided by the patient.  Chest Pain He is actually complaining of cough rather than chest pain. He has had a cough for the last 4 days. Cough is nonproductive. There is associated nasal congestion without rhinorrhea. He denies fever chills or sweats. He has noted some dyspnea. There's been some tight feeling in his chest. His chest is sore from coughing but is not actually having any chest pain per se. He has taken over-the-counter cough medicine with no relief. He is a former smoker having quit 12 years ago.  Past Medical History  Diagnosis Date  . Hypertension    No past surgical history on file. No family history on file. History  Substance Use Topics  . Smoking status: Former Research scientist (life sciences)  . Smokeless tobacco: Never Used  . Alcohol Use: Yes    Review of Systems  Cardiovascular: Positive for chest pain.  All other systems reviewed and are negative.    Allergies  Dilaudid and Tegretol  Home Medications   Current Outpatient Rx  Name  Route  Sig  Dispense  Refill  . amLODipine (NORVASC) 5 MG tablet   Oral   Take 5 mg by mouth every morning.         Marland Kitchen aspirin EC 81 MG tablet   Oral   Take 81 mg by mouth daily.         Marland Kitchen losartan-hydrochlorothiazide (HYZAAR) 100-12.5 MG per tablet   Oral   Take 1 tablet by mouth every morning.         . Multiple Vitamin (MULITIVITAMIN WITH MINERALS) TABS   Oral   Take 1 tablet by mouth every morning.         Marland Kitchen PHENobarbital (LUMINAL) 97.2 MG tablet   Oral   Take 97.2 mg by mouth every morning.         . pravastatin (PRAVACHOL) 40 MG tablet   Oral   Take 40 mg by mouth daily.          . tamsulosin (FLOMAX) 0.4 MG CAPS capsule   Oral   Take 0.4 mg by mouth daily after supper.          BP 117/58  Pulse 99  Temp(Src) 98.8 F (37.1 C) (Oral)  Resp 20  SpO2 91% Physical Exam  Nursing note and vitals reviewed.  67 year old male, resting comfortably and in no acute distress. Vital signs are normal. Oxygen saturation is 91%, which is borderline hypoxic. Head is normocephalic and atraumatic. PERRLA, EOMI. Oropharynx is clear. Neck is nontender and supple without adenopathy or JVD. Back is nontender and there is no CVA tenderness. Lungs have faint expiratory rhonchi with prolonged exhalation phase. No overt rales or wheezes. Chest is nontender. Heart has regular rate and rhythm without murmur. Abdomen is soft, flat, nontender without masses or hepatosplenomegaly and peristalsis is normoactive. Extremities have no cyanosis or edema, full range of motion is present. Skin is warm and dry without rash. Neurologic: Mental status is normal, cranial nerves are intact, there are no motor or sensory deficits.  ED Course  Procedures (including critical care time) Labs Review Results for orders placed during the hospital encounter of 12/26/12  CBC      Result Value Range   WBC 13.9 (*) 4.0 - 10.5 K/uL   RBC 4.47  4.22 - 5.81 MIL/uL   Hemoglobin 14.0  13.0 - 17.0 g/dL   HCT 41.0  39.0 - 52.0 %   MCV 91.7  78.0 - 100.0 fL   MCH 31.3  26.0 - 34.0 pg   MCHC 34.1  30.0 - 36.0 g/dL   RDW 12.9  11.5 - 15.5 %   Platelets 217  150 - 400 K/uL  BASIC METABOLIC PANEL      Result Value Range   Sodium 137  135 - 145 mEq/L   Potassium 3.4 (*) 3.5 - 5.1 mEq/L   Chloride 101  96 - 112 mEq/L   CO2 25  19 - 32 mEq/L   Glucose, Bld 152 (*) 70 - 99 mg/dL   BUN 16  6 - 23 mg/dL   Creatinine, Ser 0.83  0.50 - 1.35 mg/dL   Calcium 8.7  8.4 - 10.5 mg/dL   GFR calc non Af Amer 88 (*) >90 mL/min   GFR calc Af Amer >90  >90 mL/min  PRO B NATRIURETIC PEPTIDE      Result Value Range   Pro  B Natriuretic peptide (BNP) 32.1  0 - 125 pg/mL  POCT I-STAT TROPONIN I      Result Value Range   Troponin i, poc 0.01  0.00 - 0.08 ng/mL   Comment 3             maging Review Dg Chest 2 View  12/26/2012   CLINICAL DATA:  S pain  EXAM: CHEST  2 VIEW  COMPARISON:  Prior radiograph from 05/06/2010  FINDINGS: The cardiac and mediastinal silhouettes are stable in size and contour, and remain within normal limits.  The lungs are normally inflated without focal infiltrate, pulmonary edema, or pleural effusion. No pneumothorax.  Osseous structures are within normal limits.  IMPRESSION: No active cardiopulmonary disease.   Electronically Signed   By: Jeannine Boga M.D.   On: 12/26/2012 05:15   Images viewed by me.  EKG Interpretation     Ventricular Rate:  101 PR Interval:  172 QRS Duration: 136 QT Interval:  344 QTC Calculation: 446 R Axis:   -68 Text Interpretation:  Sinus tachycardia Left axis deviation Right bundle branch block Abnormal ECG When compared with ECG of 07/06/2010, No significant change was found            MDM   1. Acute bronchitis    Acute bronchitis. He will be given albuterol with ipratropium via nebulizer and reassessed.  He feels much better after nebulizer treatment. On exam, lungs are clear. He is given a dose of prednisone and discharged with prescription for prednisone and albuterol inhalers. Rationale for not giving antibiotics was explained and patient expresses understanding.  Delora Fuel, MD 98/33/82 [id number]

## 2012-12-26 NOTE — ED Notes (Signed)
Pt stated for past 3 days he has had an unresolved hacking cough which causes SOB, tightness of chest, and chest pain. Pt states he has taken Mucinex, and tylenol which all has not worked. Pt reports none productive cough.

## 2014-06-01 DIAGNOSIS — G4733 Obstructive sleep apnea (adult) (pediatric): Secondary | ICD-10-CM | POA: Diagnosis not present

## 2014-06-10 DIAGNOSIS — G4733 Obstructive sleep apnea (adult) (pediatric): Secondary | ICD-10-CM | POA: Diagnosis not present

## 2014-08-12 DIAGNOSIS — J209 Acute bronchitis, unspecified: Secondary | ICD-10-CM | POA: Diagnosis not present

## 2014-09-01 ENCOUNTER — Other Ambulatory Visit: Payer: Self-pay | Admitting: Family Medicine

## 2014-09-01 DIAGNOSIS — M545 Low back pain: Secondary | ICD-10-CM | POA: Diagnosis not present

## 2014-09-01 DIAGNOSIS — N400 Enlarged prostate without lower urinary tract symptoms: Secondary | ICD-10-CM | POA: Diagnosis not present

## 2014-09-01 DIAGNOSIS — E780 Pure hypercholesterolemia: Secondary | ICD-10-CM | POA: Diagnosis not present

## 2014-09-01 DIAGNOSIS — I10 Essential (primary) hypertension: Secondary | ICD-10-CM | POA: Diagnosis not present

## 2014-09-01 DIAGNOSIS — R569 Unspecified convulsions: Secondary | ICD-10-CM | POA: Diagnosis not present

## 2014-09-01 DIAGNOSIS — Z0000 Encounter for general adult medical examination without abnormal findings: Secondary | ICD-10-CM | POA: Diagnosis not present

## 2014-09-01 DIAGNOSIS — Z87891 Personal history of nicotine dependence: Secondary | ICD-10-CM

## 2014-09-01 DIAGNOSIS — L309 Dermatitis, unspecified: Secondary | ICD-10-CM | POA: Diagnosis not present

## 2014-09-01 DIAGNOSIS — M25519 Pain in unspecified shoulder: Secondary | ICD-10-CM | POA: Diagnosis not present

## 2014-09-08 ENCOUNTER — Ambulatory Visit
Admission: RE | Admit: 2014-09-08 | Discharge: 2014-09-08 | Disposition: A | Discharge status: Home / Self Care | Payer: Commercial Managed Care - HMO | Source: Ambulatory Visit | Attending: Family Medicine | Admitting: Family Medicine

## 2014-09-08 DIAGNOSIS — Z136 Encounter for screening for cardiovascular disorders: Secondary | ICD-10-CM | POA: Diagnosis not present

## 2014-09-08 DIAGNOSIS — Z87891 Personal history of nicotine dependence: Secondary | ICD-10-CM

## 2014-09-11 DIAGNOSIS — Z125 Encounter for screening for malignant neoplasm of prostate: Secondary | ICD-10-CM | POA: Diagnosis not present

## 2014-09-29 DIAGNOSIS — G4733 Obstructive sleep apnea (adult) (pediatric): Secondary | ICD-10-CM | POA: Diagnosis not present

## 2014-10-02 DIAGNOSIS — Z125 Encounter for screening for malignant neoplasm of prostate: Secondary | ICD-10-CM | POA: Diagnosis not present

## 2015-01-07 ENCOUNTER — Ambulatory Visit
Admission: RE | Admit: 2015-01-07 | Discharge: 2015-01-07 | Disposition: A | Discharge status: Home / Self Care | Payer: Commercial Managed Care - HMO | Source: Ambulatory Visit | Attending: Family Medicine | Admitting: Family Medicine

## 2015-01-07 ENCOUNTER — Other Ambulatory Visit: Payer: Self-pay | Admitting: Family Medicine

## 2015-01-07 DIAGNOSIS — I499 Cardiac arrhythmia, unspecified: Secondary | ICD-10-CM | POA: Diagnosis not present

## 2015-01-07 DIAGNOSIS — R059 Cough, unspecified: Secondary | ICD-10-CM

## 2015-01-07 DIAGNOSIS — R509 Fever, unspecified: Secondary | ICD-10-CM | POA: Diagnosis not present

## 2015-01-07 DIAGNOSIS — R05 Cough: Secondary | ICD-10-CM

## 2015-01-07 DIAGNOSIS — M25512 Pain in left shoulder: Secondary | ICD-10-CM | POA: Diagnosis not present

## 2015-01-13 DIAGNOSIS — R351 Nocturia: Secondary | ICD-10-CM | POA: Diagnosis not present

## 2015-01-13 DIAGNOSIS — R972 Elevated prostate specific antigen [PSA]: Secondary | ICD-10-CM | POA: Diagnosis not present

## 2015-01-13 DIAGNOSIS — N401 Enlarged prostate with lower urinary tract symptoms: Secondary | ICD-10-CM | POA: Diagnosis not present

## 2015-02-05 DIAGNOSIS — H6123 Impacted cerumen, bilateral: Secondary | ICD-10-CM | POA: Diagnosis not present

## 2015-02-12 DIAGNOSIS — Z23 Encounter for immunization: Secondary | ICD-10-CM | POA: Diagnosis not present

## 2015-03-10 DIAGNOSIS — R972 Elevated prostate specific antigen [PSA]: Secondary | ICD-10-CM | POA: Diagnosis not present

## 2015-03-11 ENCOUNTER — Emergency Department (HOSPITAL_COMMUNITY)
Admission: EM | Admit: 2015-03-11 | Discharge: 2015-03-11 | Disposition: A | Discharge status: Home / Self Care | Payer: Commercial Managed Care - HMO | Source: Home / Self Care | Attending: Emergency Medicine | Admitting: Emergency Medicine

## 2015-03-11 ENCOUNTER — Encounter (HOSPITAL_COMMUNITY): Payer: Self-pay

## 2015-03-11 DIAGNOSIS — N390 Urinary tract infection, site not specified: Secondary | ICD-10-CM

## 2015-03-11 DIAGNOSIS — R509 Fever, unspecified: Secondary | ICD-10-CM | POA: Diagnosis not present

## 2015-03-11 HISTORY — DX: Unspecified convulsions: R56.9

## 2015-03-11 LAB — URINE MICROSCOPIC-ADD ON

## 2015-03-11 LAB — BASIC METABOLIC PANEL
ANION GAP: 10 (ref 5–15)
BUN: 17 mg/dL (ref 6–20)
CHLORIDE: 101 mmol/L (ref 101–111)
CO2: 28 mmol/L (ref 22–32)
Calcium: 8.6 mg/dL — ABNORMAL LOW (ref 8.9–10.3)
Creatinine, Ser: 0.77 mg/dL (ref 0.61–1.24)
GFR calc Af Amer: >60 mL/min (ref >60)
GLUCOSE: 130 mg/dL — AB (ref 65–99)
POTASSIUM: 3.6 mmol/L (ref 3.5–5.1)
SODIUM: 139 mmol/L (ref 135–145)

## 2015-03-11 LAB — CBC
HCT: 42 % (ref 39.0–52.0)
HEMOGLOBIN: 13.5 g/dL (ref 13.0–17.0)
MCH: 30.5 pg (ref 26.0–34.0)
MCHC: 32.1 g/dL (ref 30.0–36.0)
MCV: 95 fL (ref 78.0–100.0)
PLATELETS: 192 10*3/uL (ref 150–400)
RBC: 4.42 MIL/uL (ref 4.22–5.81)
RDW: 13.2 % (ref 11.5–15.5)
WBC: 12 10*3/uL — AB (ref 4.0–10.5)

## 2015-03-11 LAB — URINALYSIS, ROUTINE W REFLEX MICROSCOPIC
Bilirubin Urine: NEGATIVE
Glucose, UA: NEGATIVE mg/dL
Ketones, ur: NEGATIVE mg/dL
Nitrite: NEGATIVE
PROTEIN: NEGATIVE mg/dL
SPECIFIC GRAVITY, URINE: 1.022 (ref 1.005–1.030)
pH: 5.5 (ref 5.0–8.0)

## 2015-03-11 MED ORDER — DEXTROSE 5 % IV SOLN
1.0000 g | Freq: Once | INTRAVENOUS | Status: AC
  Administered 2015-03-11: 1 g via INTRAVENOUS
  Filled 2015-03-11: qty 10

## 2015-03-11 MED ORDER — CEFTRIAXONE SODIUM 1 G IJ SOLR
1.0000 g | Freq: Once | INTRAMUSCULAR | Status: AC
  Administered 2015-03-11: 1 g via INTRAVENOUS
  Filled 2015-03-11: qty 10

## 2015-03-11 MED ORDER — MORPHINE SULFATE (PF) 4 MG/ML IV SOLN
4.0000 mg | INTRAVENOUS | Status: DC | PRN
Start: 2015-03-11 — End: 2015-03-11
  Administered 2015-03-11: 4 mg via INTRAVENOUS
  Filled 2015-03-11: qty 1

## 2015-03-11 MED ORDER — SODIUM CHLORIDE 0.9 % IV SOLN
Freq: Once | INTRAVENOUS | Status: AC
  Administered 2015-03-11: 09:00:00 via INTRAVENOUS

## 2015-03-11 NOTE — Discharge Instructions (Signed)

## 2015-03-11 NOTE — ED Notes (Signed)
Bladder scan approx 350

## 2015-03-11 NOTE — ED Notes (Signed)
Per EMS- Patient had a prostate biopsy yesterday. Today patient wakes with fever of 101.0 orally and vomiting x 3. EMS gave Zofran 4 mg IV and Tylenol 1000 mg po prior arrival to the ED.

## 2015-03-11 NOTE — ED Notes (Signed)
Bed: EY81 Expected date:  Expected time:  Means of arrival:  Comments: Ems-nv, abdominal pain

## 2015-03-11 NOTE — ED Provider Notes (Signed)
CSN: [id number]     Arrival date & time 03/11/15  0177 History   First MD Initiated Contact with Patient 03/11/15 0820     Chief Complaint  Patient presents with  . Emesis  . Fever   HPI Patient presents to the emergency room with complaints of fever and dysuria. Patient was in the hospital yesterday for a prostate biopsy. Patient was home last evening and he felt fine. Throughout the night though he started to have difficulty with urinary frequency. Patient also felt like he wasn't emptying his bladder completely. He woke up this morning and had a fever of 101. He did vomit a few times. EMS gave the patient for Zofran and a gram of Tylenol. Patient denies any other trouble with coughing or diarrhea. No sore throat.  Past Medical History  Diagnosis Date  . Hypertension   . Seizures [facility])    Past Surgical History  Procedure Laterality Date  . Replacement total knee    . Tonsillectomy    . Circumcision     Family History  Problem Relation Age of Onset  . Family history unknown: Yes   Social History  Substance Use Topics  . Smoking status: Former Research scientist (life sciences)  . Smokeless tobacco: Never Used  . Alcohol Use: Yes    Review of Systems  All other systems reviewed and are negative.     Allergies  Dilaudid and Tegretol  Home Medications   Prior to Admission medications   Medication Sig Start Date End Date Taking? Authorizing Provider  albuterol (PROVENTIL HFA;VENTOLIN HFA) 108 (90 BASE) MCG/ACT inhaler Inhale 2 puffs into the lungs every 2 (two) hours as needed for wheezing or shortness of breath (cough). 93/90/30   Delora Fuel, MD  amLODipine (NORVASC) 5 MG tablet Take 5 mg by mouth every morning.    Historical Provider, MD  aspirin EC 81 MG tablet Take 81 mg by mouth daily.    Historical Provider, MD  losartan-hydrochlorothiazide (HYZAAR) 100-12.5 MG per tablet Take 1 tablet by mouth every morning.    Historical Provider, MD  Multiple Vitamin (MULITIVITAMIN WITH MINERALS) TABS Take  1 tablet by mouth every morning.    Historical Provider, MD  PHENobarbital (LUMINAL) 97.2 MG tablet Take 97.2 mg by mouth every morning.    Historical Provider, MD  pravastatin (PRAVACHOL) 40 MG tablet Take 40 mg by mouth daily.    Historical Provider, MD  predniSONE (DELTASONE) 50 MG tablet Take 1 tablet (50 mg total) by mouth daily. 11/26/28   Delora Fuel, MD  tamsulosin (FLOMAX) 0.4 MG CAPS capsule Take 0.4 mg by mouth daily after supper.    Historical Provider, MD   BP 135/76 mmHg  Pulse 107  Temp(Src) 102.8 F (39.3 C) (Oral)  Resp 18  SpO2 94% Physical Exam  Constitutional: He appears well-developed and well-nourished. No distress.  HENT:  Head: Normocephalic and atraumatic.  Right Ear: External ear normal.  Left Ear: External ear normal.  Eyes: Conjunctivae are normal. Right eye exhibits no discharge. Left eye exhibits no discharge. No scleral icterus.  Neck: Neck supple. No tracheal deviation present.  Cardiovascular: Normal rate, regular rhythm and intact distal pulses.   Pulmonary/Chest: Effort normal and breath sounds normal. No stridor. No respiratory distress. He has no wheezes. He has no rales.  Abdominal: Soft. Bowel sounds are normal. He exhibits no distension. There is no tenderness. There is no rebound and no guarding.  Musculoskeletal: He exhibits no edema or tenderness.  Neurological: He is alert. He has  normal strength. No cranial nerve deficit (no facial droop, extraocular movements intact, no slurred speech) or sensory deficit. He exhibits normal muscle tone. He displays no seizure activity. Coordination normal.  Skin: Skin is warm and dry. No rash noted.  Psychiatric: He has a normal mood and affect.  Nursing note and vitals reviewed.   ED Course  Procedures (including critical care time) Labs Review Labs Reviewed  CBC - Abnormal; Notable for the following:    WBC 12.0 (*)    All other components within normal limits  BASIC METABOLIC PANEL - Abnormal;  Notable for the following:    Glucose, Bld 130 (*)    Calcium 8.6 (*)    All other components within normal limits  URINALYSIS, ROUTINE W REFLEX MICROSCOPIC (NOT AT [facility]) - Abnormal; Notable for the following:    Color, Urine AMBER (*)    APPearance CLOUDY (*)    Hgb urine dipstick LARGE (*)    Leukocytes, UA SMALL (*)    All other components within normal limits  URINE MICROSCOPIC-ADD ON - Abnormal; Notable for the following:    Squamous Epithelial / LPF 0-5 (*)    Bacteria, UA FEW (*)    All other components within normal limits  URINE CULTURE   Medications  morphine 4 MG/ML injection 4 mg (not administered)  cefTRIAXone (ROCEPHIN) 1 g in dextrose 5 % 50 mL IVPB (not administered)  0.9 %  sodium chloride infusion ( Intravenous New Bag/Given 03/11/15 0922)  cefTRIAXone (ROCEPHIN) 1 g in dextrose 5 % 50 mL IVPB (1 g Intravenous New Bag/Given 03/11/15 1030)     MDM   Final diagnoses:  UTI (lower urinary tract infection)    Pt presents to the ED with fever and dysuria after a prostate biopsy yesterday.  WBC is elevated.  UA does show pyuria and is suggestive of infection.  Pt has persistent high fevers.  Foley catheter only with 350 cc of urine.  Will consult with urology  1130  I discussed the case earlier with Dr. Karsten Ro.  He recommends giving the patient 2 g of Rocephin IV. I had given him 1 g. He will have the patient follow-up in the office tomorrow. They may need to give him additional dose.  I discussed this plan with the patient. He is comfortable with going home. We discussed warning signs and precautions to return to the ER.    Dorie Rank, MD 03/11/15 [phone]

## 2015-03-12 ENCOUNTER — Other Ambulatory Visit: Payer: Self-pay | Admitting: Urology

## 2015-03-12 ENCOUNTER — Inpatient Hospital Stay (HOSPITAL_COMMUNITY)
Admission: AD | Admit: 2015-03-12 | Discharge: 2015-03-16 | DRG: 872 | Disposition: A | Discharge status: Home / Self Care | Payer: Commercial Managed Care - HMO | Attending: Urology | Admitting: Urology

## 2015-03-12 ENCOUNTER — Encounter (HOSPITAL_COMMUNITY): Payer: Self-pay

## 2015-03-12 DIAGNOSIS — R5082 Postprocedural fever: Secondary | ICD-10-CM | POA: Diagnosis not present

## 2015-03-12 DIAGNOSIS — R569 Unspecified convulsions: Secondary | ICD-10-CM | POA: Diagnosis present

## 2015-03-12 DIAGNOSIS — Z7982 Long term (current) use of aspirin: Secondary | ICD-10-CM | POA: Diagnosis not present

## 2015-03-12 DIAGNOSIS — R3912 Poor urinary stream: Secondary | ICD-10-CM | POA: Diagnosis not present

## 2015-03-12 DIAGNOSIS — R509 Fever, unspecified: Secondary | ICD-10-CM | POA: Diagnosis not present

## 2015-03-12 DIAGNOSIS — Z79899 Other long term (current) drug therapy: Secondary | ICD-10-CM

## 2015-03-12 DIAGNOSIS — Z87891 Personal history of nicotine dependence: Secondary | ICD-10-CM | POA: Diagnosis not present

## 2015-03-12 DIAGNOSIS — N390 Urinary tract infection, site not specified: Secondary | ICD-10-CM | POA: Diagnosis not present

## 2015-03-12 DIAGNOSIS — Z885 Allergy status to narcotic agent status: Secondary | ICD-10-CM

## 2015-03-12 DIAGNOSIS — R7881 Bacteremia: Secondary | ICD-10-CM | POA: Diagnosis not present

## 2015-03-12 DIAGNOSIS — I10 Essential (primary) hypertension: Secondary | ICD-10-CM | POA: Diagnosis not present

## 2015-03-12 DIAGNOSIS — Z888 Allergy status to other drugs, medicaments and biological substances status: Secondary | ICD-10-CM | POA: Diagnosis not present

## 2015-03-12 DIAGNOSIS — R3911 Hesitancy of micturition: Secondary | ICD-10-CM | POA: Diagnosis not present

## 2015-03-12 LAB — CBC WITH DIFFERENTIAL/PLATELET
Basophils Absolute: 0 10*3/uL (ref 0.0–0.1)
Basophils Relative: 0 %
EOS PCT: 0 %
Eosinophils Absolute: 0 10*3/uL (ref 0.0–0.7)
HEMATOCRIT: 38.9 % — AB (ref 39.0–52.0)
Hemoglobin: 12.4 g/dL — ABNORMAL LOW (ref 13.0–17.0)
LYMPHS ABS: 1.4 10*3/uL (ref 0.7–4.0)
LYMPHS PCT: 11 %
MCH: 30.6 pg (ref 26.0–34.0)
MCHC: 31.9 g/dL (ref 30.0–36.0)
MCV: 96 fL (ref 78.0–100.0)
MONO ABS: 0.8 10*3/uL (ref 0.1–1.0)
Monocytes Relative: 6 %
Neutro Abs: 10.6 10*3/uL — ABNORMAL HIGH (ref 1.7–7.7)
Neutrophils Relative %: 83 %
PLATELETS: 168 10*3/uL (ref 150–400)
RBC: 4.05 MIL/uL — ABNORMAL LOW (ref 4.22–5.81)
RDW: 13.6 % (ref 11.5–15.5)
WBC: 12.8 10*3/uL — ABNORMAL HIGH (ref 4.0–10.5)

## 2015-03-12 LAB — URINE CULTURE: Culture: 2000

## 2015-03-12 LAB — BASIC METABOLIC PANEL
ANION GAP: 9 (ref 5–15)
BUN: 18 mg/dL (ref 6–20)
CHLORIDE: 99 mmol/L — AB (ref 101–111)
CO2: 30 mmol/L (ref 22–32)
Calcium: 8.6 mg/dL — ABNORMAL LOW (ref 8.9–10.3)
Creatinine, Ser: 0.94 mg/dL (ref 0.61–1.24)
GFR calc non Af Amer: >60 mL/min (ref >60)
GLUCOSE: 125 mg/dL — AB (ref 65–99)
Potassium: 3.2 mmol/L — ABNORMAL LOW (ref 3.5–5.1)
SODIUM: 138 mmol/L (ref 135–145)

## 2015-03-12 MED ORDER — ASPIRIN EC 81 MG PO TBEC
81.0000 mg | DELAYED_RELEASE_TABLET | Freq: Every day | ORAL | Status: DC
  Administered 2015-03-12 – 2015-03-16 (×5): 81 mg via ORAL
  Filled 2015-03-12 (×5): qty 1

## 2015-03-12 MED ORDER — PHENOBARBITAL 97.2 MG PO TABS
97.2000 mg | ORAL_TABLET | Freq: Every morning | ORAL | Status: DC
  Administered 2015-03-12: 97.2 mg via ORAL
  Filled 2015-03-12 (×2): qty 1

## 2015-03-12 MED ORDER — SODIUM CHLORIDE 0.45 % IV SOLN
INTRAVENOUS | Status: DC
  Administered 2015-03-12 – 2015-03-14 (×3): via INTRAVENOUS

## 2015-03-12 MED ORDER — FINASTERIDE 5 MG PO TABS
5.0000 mg | ORAL_TABLET | Freq: Every day | ORAL | Status: DC
  Administered 2015-03-12 – 2015-03-16 (×5): 5 mg via ORAL
  Filled 2015-03-12 (×5): qty 1

## 2015-03-12 MED ORDER — PRAVASTATIN SODIUM 40 MG PO TABS
40.0000 mg | ORAL_TABLET | Freq: Every day | ORAL | Status: DC
  Administered 2015-03-12 – 2015-03-16 (×5): 40 mg via ORAL
  Filled 2015-03-12 (×5): qty 1

## 2015-03-12 MED ORDER — ACETAMINOPHEN 325 MG PO TABS
650.0000 mg | ORAL_TABLET | ORAL | Status: DC | PRN
  Administered 2015-03-13 – 2015-03-15 (×3): 650 mg via ORAL
  Filled 2015-03-12 (×3): qty 2

## 2015-03-12 MED ORDER — ENOXAPARIN SODIUM 60 MG/0.6ML ~~LOC~~ SOLN
60.0000 mg | SUBCUTANEOUS | Status: DC
  Administered 2015-03-12 – 2015-03-14 (×3): 60 mg via SUBCUTANEOUS
  Filled 2015-03-12 (×5): qty 0.6

## 2015-03-12 MED ORDER — AMLODIPINE BESYLATE 5 MG PO TABS
5.0000 mg | ORAL_TABLET | Freq: Every morning | ORAL | Status: DC
  Administered 2015-03-12 – 2015-03-16 (×5): 5 mg via ORAL
  Filled 2015-03-12 (×5): qty 1

## 2015-03-12 MED ORDER — LOSARTAN POTASSIUM 50 MG PO TABS
100.0000 mg | ORAL_TABLET | Freq: Every day | ORAL | Status: DC
  Administered 2015-03-13 – 2015-03-16 (×4): 100 mg via ORAL
  Filled 2015-03-12 (×4): qty 2

## 2015-03-12 MED ORDER — CETYLPYRIDINIUM CHLORIDE 0.05 % MT LIQD
7.0000 mL | Freq: Two times a day (BID) | OROMUCOSAL | Status: DC
Start: 2015-03-12 — End: 2015-03-16
  Administered 2015-03-12 – 2015-03-13 (×2): 7 mL via OROMUCOSAL

## 2015-03-12 MED ORDER — HYDROCHLOROTHIAZIDE 12.5 MG PO CAPS
12.5000 mg | ORAL_CAPSULE | Freq: Every day | ORAL | Status: DC
  Administered 2015-03-13 – 2015-03-16 (×4): 12.5 mg via ORAL
  Filled 2015-03-12 (×4): qty 1

## 2015-03-12 MED ORDER — ONDANSETRON HCL 4 MG/2ML IJ SOLN: 4.0000 mg | INTRAMUSCULAR | Status: DC | PRN

## 2015-03-12 MED ORDER — DOCUSATE SODIUM 100 MG PO CAPS
100.0000 mg | ORAL_CAPSULE | Freq: Two times a day (BID) | ORAL | Status: DC
  Administered 2015-03-12 – 2015-03-13 (×2): 100 mg via ORAL
  Filled 2015-03-12 (×8): qty 1

## 2015-03-12 MED ORDER — LOSARTAN POTASSIUM-HCTZ 100-12.5 MG PO TABS: 1.0000 | ORAL_TABLET | Freq: Every morning | ORAL | Status: DC

## 2015-03-12 MED ORDER — ADULT MULTIVITAMIN W/MINERALS CH
1.0000 | ORAL_TABLET | Freq: Every morning | ORAL | Status: DC
  Administered 2015-03-12 – 2015-03-16 (×5): 1 via ORAL
  Filled 2015-03-12 (×5): qty 1

## 2015-03-12 MED ORDER — ENOXAPARIN SODIUM 30 MG/0.3ML ~~LOC~~ SOLN
30.0000 mg | Freq: Two times a day (BID) | SUBCUTANEOUS | Status: DC
  Filled 2015-03-12: qty 0.3

## 2015-03-12 MED ORDER — ALBUTEROL SULFATE (2.5 MG/3ML) 0.083% IN NEBU: 3.0000 mL | INHALATION_SOLUTION | RESPIRATORY_TRACT | Status: DC | PRN

## 2015-03-12 MED ORDER — PIPERACILLIN-TAZOBACTAM 3.375 G IVPB
3.3750 g | Freq: Three times a day (TID) | INTRAVENOUS | Status: DC
  Administered 2015-03-12 – 2015-03-16 (×12): 3.375 g via INTRAVENOUS
  Filled 2015-03-12 (×13): qty 50

## 2015-03-12 NOTE — H&P (Signed)
Urology History and Physical Exam  CC: fever  HPI: This man had a prostate biopsy earlier this week. He started having fevers, chills, and difficulty urinating early yesterday. He did take his Levaquin prior to the procedure as well as after, and in the emergency room last night, where he presented because of his fevers, shakes, chills and inability to urinate, he was given 2 g of Rocephin and his urine was cultured. Catheter was placed temporarily, removed before he left the emergency room. He is still having shakes and chills and feels terrible.   PMH: Past Medical History  Diagnosis Date  . Hypertension   . Seizures (HCC)     PSH: Past Surgical History  Procedure Laterality Date  . Replacement total knee    . Tonsillectomy    . Circumcision      Allergies: Allergies  Allergen Reactions  . Dilaudid [Hydromorphone Hcl] Other (See Comments)    "goes wild"  . Morphine And Related     "out of it"  . Tegretol [Carbamazepine] Hives    Medications: Prescriptions prior to admission  Medication Sig Dispense Refill Last Dose  . acetaminophen (TYLENOL) 500 MG tablet Take 1,000 mg by mouth every 6 (six) hours as needed (Pain).   03/11/2015 at Unknown time  . amLODipine (NORVASC) 5 MG tablet Take 5 mg by mouth every morning.   03/11/2015 at Unknown time  . finasteride (PROSCAR) 5 MG tablet Take 5 mg by mouth daily.    03/11/2015 at Unknown time  . losartan-hydrochlorothiazide (HYZAAR) 100-12.5 MG per tablet Take 1 tablet by mouth every morning.   03/11/2015 at Unknown time  . PHENobarbital (LUMINAL) 97.2 MG tablet Take 97.2 mg by mouth every morning.   03/11/2015 at Unknown time  . pravastatin (PRAVACHOL) 40 MG tablet Take 40 mg by mouth daily.   03/10/2015 at Unknown time  . albuterol (PROVENTIL HFA;VENTOLIN HFA) 108 (90 BASE) MCG/ACT inhaler Inhale 2 puffs into the lungs every 2 (two) hours as needed for wheezing or shortness of breath (cough). (Patient not taking: Reported on 03/11/2015) 1  Inhaler 0 Completed Course at Unknown time  . aspirin EC 81 MG tablet Take 81 mg by mouth daily.   03/03/2015  . Multiple Vitamin (MULITIVITAMIN WITH MINERALS) TABS Take 1 tablet by mouth every morning.   03/03/2015  . predniSONE (DELTASONE) 50 MG tablet Take 1 tablet (50 mg total) by mouth daily. (Patient not taking: Reported on 03/11/2015) 5 tablet 0 Completed Course at Unknown time     Social History: Social History   Social History  . Marital Status: Married    Spouse Name: N/A  . Number of Children: N/A  . Years of Education: N/A   Occupational History  . Not on file.   Social History Main Topics  . Smoking status: Former Research scientist (life sciences)  . Smokeless tobacco: Never Used  . Alcohol Use: Yes  . Drug Use: No  . Sexual Activity: No   Other Topics Concern  . Not on file   Social History Narrative    Family History: Family History  Problem Relation Age of Onset  . Family history unknown: Yes    Review of Systems: Positive: fever, shakes, chills, discomfort with urination Negative:   A further 10 point review of systems was negative except what is listed in the HPI.                  Physical Exam: _0 @ General: Mild distress.  Awake. Head:  Normocephalic.  Atraumatic. ENT:  EOMI.  Mucous membranes moist Neck:  Supple.  No lymphadenopathy. CV:  S1 present. S2 present. Slightly increased rate Pulmonary: Equal effort bilaterally.  Clear to auscultation bilaterally. Abdomen: Soft.  9 tender to palpation. Skin:  Normal turgor.  No visible rash. Extremity: No gross deformity of bilateral upper extremities.  No gross deformity of                             lower extremities. Neurologic: Alert. Appropriate mood.   Studies:  Recent Labs     03/11/15  0852  03/12/15  1338  HGB  13.5  12.4*  WBC  12.0*  12.8*  PLT  192  168    Recent Labs     03/11/15  0852  03/12/15  1338  NA  139  138  K  3.6  3.2*  CL  101  99*  CO2  28  30  BUN  17  18  CREATININE  0.77   0.94  CALCIUM  8.6*  8.6*  GFRNONAA  >60  >60  GFRAA  >60  >60    He has a mild leukocytosis No results for input(s): INR, APTT in the last 72 hours.  Invalid input(s): PT   Invalid input(s): ABG    Assessment:  Fever, following transrectal ultrasound and biopsy of the prostate-may well be secondary to bacteremia  Plan: 1.  I will admit the patient for hydration, IV antibiotic management  2.  We will culture him-urine and blood

## 2015-03-13 MED ORDER — TAMSULOSIN HCL 0.4 MG PO CAPS
0.4000 mg | ORAL_CAPSULE | Freq: Every day | ORAL | Status: DC
  Administered 2015-03-13 – 2015-03-16 (×4): 0.4 mg via ORAL
  Filled 2015-03-13 (×4): qty 1

## 2015-03-13 MED ORDER — PHENOBARBITAL 97.2 MG PO TABS
97.2000 mg | ORAL_TABLET | Freq: Every day | ORAL | Status: DC
  Administered 2015-03-13 – 2015-03-16 (×4): 97.2 mg via ORAL
  Filled 2015-03-13 (×3): qty 1

## 2015-03-13 NOTE — Progress Notes (Signed)
  Subjective: Patient reports that he is feeling better. Still having sweats, less shakes and chills. He is having a slow stream but feels like he is emptying fairly well.  Objective: Vital signs in last 24 hours: Temp:  [98.6 F (37 C)-98.8 F (37.1 C)] 98.7 F (37.1 C) (01/07 0610) Pulse Rate:  [78-91] 78 (01/07 0610) Resp:  [18-20] 20 (01/07 0610) BP: (122-148)/(68-77) 130/77 mmHg (01/07 0610) SpO2:  [95 %-97 %] 96 % (01/07 0610) Weight:  [123 kg (271 lb 2.7 oz)] 123 kg (271 lb 2.7 oz) (01/06 1313)  Intake/Output from previous day: 01/06 0701 - 01/07 0700 In: 1793.3 [I.V.:1643.3; IV Piggyback:150] Out: 400 [Urine:400] Intake/Output this shift:    Physical Exam:  Constitutional: Vital signs reviewed. WD WN in NAD   Eyes: PERRL, No scleral icterus.   Pulmonary/Chest: Normal effort Abdominal: Soft. Non-tender, non-distended, bowel sounds are normal, no masses, organomegaly, or guarding present.  Genitourinary: Extremities: No cyanosis or edema   Lab Results:  Recent Labs  03/11/15 0852 03/12/15 1338  HGB 13.5 12.4*  HCT 42.0 38.9*   BMET  Recent Labs  03/11/15 0852 03/12/15 1338  NA 139 138  K 3.6 3.2*  CL 101 99*  CO2 28 30  GLUCOSE 130* 125*  BUN 17 18  CREATININE 0.77 0.94  CALCIUM 8.6* 8.6*   No results for input(s): LABPT, INR in the last 72 hours. No results for input(s): LABURIN in the last 72 hours. Results for orders placed or performed during the hospital encounter of 03/11/15  Urine culture     Status: None   Collection Time: 03/11/15  9:19 AM  Result Value Ref Range Status   Specimen Description URINE, CATHETERIZED  Final   Special Requests NONE  Final   Culture   Final    2,000 COLONIES/mL INSIGNIFICANT GROWTH Performed at [facility]    Report Status 03/12/2015 FINAL  Final   Urine culture from the emergency room revealed no growth. Blood cultures are pending.  Studies/Results: No results  found.  Assessment/Plan:   Hospital day #2, admitted for post transrectal ultrasound and biopsy fever. This is most likely a resistant organism. He is currently stable and improving on Zosyn.    I will saline lock the IV. I will add Flomax to his medication regimen. Anticipate another couple of days hospitalization, until blood cultures have returned   LOS: 1 day   Franchot Gallo M 03/13/2015, 7:52 AM

## 2015-03-14 LAB — CBC
HCT: 37.9 % — ABNORMAL LOW (ref 39.0–52.0)
Hemoglobin: 12.3 g/dL — ABNORMAL LOW (ref 13.0–17.0)
MCH: 30.4 pg (ref 26.0–34.0)
MCHC: 32.5 g/dL (ref 30.0–36.0)
MCV: 93.8 fL (ref 78.0–100.0)
PLATELETS: 160 10*3/uL (ref 150–400)
RBC: 4.04 MIL/uL — AB (ref 4.22–5.81)
RDW: 13.2 % (ref 11.5–15.5)
WBC: 7 10*3/uL (ref 4.0–10.5)

## 2015-03-14 LAB — BASIC METABOLIC PANEL
Anion gap: 11 (ref 5–15)
BUN: 13 mg/dL (ref 6–20)
CALCIUM: 8.3 mg/dL — AB (ref 8.9–10.3)
CO2: 30 mmol/L (ref 22–32)
CREATININE: 0.73 mg/dL (ref 0.61–1.24)
Chloride: 100 mmol/L — ABNORMAL LOW (ref 101–111)
GFR calc Af Amer: >60 mL/min (ref >60)
Glucose, Bld: 117 mg/dL — ABNORMAL HIGH (ref 65–99)
POTASSIUM: 3 mmol/L — AB (ref 3.5–5.1)
SODIUM: 141 mmol/L (ref 135–145)

## 2015-03-14 LAB — C DIFFICILE QUICK SCREEN W PCR REFLEX
C DIFFICLE (CDIFF) ANTIGEN: NEGATIVE
C Diff interpretation: NEGATIVE
C Diff toxin: NEGATIVE

## 2015-03-14 MED ORDER — LACTINEX PO CHEW
2.0000 | CHEWABLE_TABLET | Freq: Three times a day (TID) | ORAL | Status: DC
  Administered 2015-03-14 (×2): 2 via ORAL
  Filled 2015-03-14 (×10): qty 2

## 2015-03-14 MED ORDER — BACID PO TABS: 2.0000 | ORAL_TABLET | Freq: Three times a day (TID) | ORAL | Status: DC

## 2015-03-14 MED ORDER — LOPERAMIDE HCL 2 MG PO CAPS: 2.0000 mg | ORAL_CAPSULE | ORAL | Status: DC | PRN

## 2015-03-14 NOTE — Progress Notes (Signed)
  Subjective: NAEON. Continues to improve. No fevers or chills. Emptying ok. Now with copious watery stools. No bowel cramping.  Objective: Vital signs in last 24 hours: Temp:  [97.8 F (36.6 C)-99.3 F (37.4 C)] 97.8 F (36.6 C) (01/08 0531) Pulse Rate:  [77-93] 77 (01/08 0531) Resp:  [18-20] 20 (01/08 0531) BP: (128-129)/(68-77) 128/75 mmHg (01/08 0531) SpO2:  [96 %-97 %] 96 % (01/08 0531)  Intake/Output from previous day: 01/07 0701 - 01/08 0700 In: 870 [P.O.:720; IV Piggyback:150] Out: -  Intake/Output this shift: Total I/O In: 100 [IV Piggyback:100] Out: -   Physical Exam:  Constitutional: Vital signs reviewed. WD WN in NAD   Eyes: PERRL, No scleral icterus.   Pulmonary/Chest: Normal effort Abdominal: Soft. Non-tender, non-distended, bowel sounds are normal, no masses, organomegaly, or guarding present.  Genitourinary: Extremities: No cyanosis or edema   Lab Results:  Recent Labs  03/11/15 0852 03/12/15 1338 03/14/15 0454  HGB 13.5 12.4* 12.3*  HCT 42.0 38.9* 37.9*   BMET  Recent Labs  03/12/15 1338 03/14/15 0454  NA 138 141  K 3.2* 3.0*  CL 99* 100*  CO2 30 30  GLUCOSE 125* 117*  BUN 18 13  CREATININE 0.94 0.73  CALCIUM 8.6* 8.3*   No results for input(s): LABPT, INR in the last 72 hours. No results for input(s): LABURIN in the last 72 hours. Results for orders placed or performed during the hospital encounter of 03/11/15  Urine culture     Status: None   Collection Time: 03/11/15  9:19 AM  Result Value Ref Range Status   Specimen Description URINE, CATHETERIZED  Final   Special Requests NONE  Final   Culture   Final    2,000 COLONIES/mL INSIGNIFICANT GROWTH Performed at [facility]    Report Status 03/12/2015 FINAL  Final   Urine culture from the emergency room revealed no growth. Blood cultures are pending.  Studies/Results: No results found.  Assessment/Plan:   Hospital day #3, admitted for post transrectal ultrasound and  biopsy fever. This is most likely a resistant organism. He is currently stable and improving on Zosyn.  Will send C. Diff and start Imodium for watery diarrhea as well as start him on a probiotic  Will remain in house until cultures finalize   LOS: 2 days   Star Age 03/14/2015, 6:57 AM   Seen , examined and agree.

## 2015-03-15 MED ORDER — TAMSULOSIN HCL 0.4 MG PO CAPS: 0.4000 mg | ORAL_CAPSULE | Freq: Every day | ORAL | Status: DC

## 2015-03-15 MED ORDER — LACTINEX PO CHEW: 2.0000 | CHEWABLE_TABLET | Freq: Three times a day (TID) | ORAL | Status: DC

## 2015-03-15 MED ORDER — AMOXICILLIN-POT CLAVULANATE 500-125 MG PO TABS: 1.0000 | ORAL_TABLET | Freq: Three times a day (TID) | ORAL | Status: DC

## 2015-03-15 MED ORDER — LOPERAMIDE HCL 2 MG PO CAPS: 2.0000 mg | ORAL_CAPSULE | ORAL | Status: DC | PRN

## 2015-03-15 NOTE — Care Management Note (Signed)
Case Management Note  Patient Details  Name: Ruben Murray MRN: [id number] Date of Birth: [birth year]  Subjective/Objective:  70 y/o m admitted w/Fever. From home.                  Action/Plan:d/c plan home.   Expected Discharge Date:   (unknown)               Expected Discharge Plan:  Home/Self Care  In-House Referral:     Discharge planning Services  CM Consult  Post Acute Care Choice:    Choice offered to:     DME Arranged:    DME Agency:     HH Arranged:    HH Agency:     Status of Service:  In process, will continue to follow  Medicare Important Message Given:    Date Medicare IM Given:    Medicare IM give by:    Date Additional Medicare IM Given:    Additional Medicare Important Message give by:     If discussed at [place] of Stay Meetings, dates discussed:    Additional Comments:  Dessa Phi, RN 03/15/2015, 4:03 PM

## 2015-03-15 NOTE — Progress Notes (Signed)
  Subjective: Patient reports that he is feeling better. Fewer stools. Cdiff neg.  Objective: Vital signs in last 24 hours: Temp:  [98 F (36.7 C)-98.3 F (36.8 C)] 98 F (36.7 C) (01/09 1358) Pulse Rate:  [77-91] 91 (01/09 1358) Resp:  [18] 18 (01/09 1358) BP: (135-146)/(63-90) 140/80 mmHg (01/09 1358) SpO2:  [95 %-96 %] 96 % (01/09 1358)  Intake/Output from previous day: 01/08 0701 - 01/09 0700 In: 1740 [P.O.:840; I.V.:800; IV Piggyback:100] Out: -  Intake/Output this shift:    Physical Exam:  Constitutional: Vital signs reviewed. WD WN in NAD   Eyes: PERRL, No scleral icterus.   Cardiovascular: RRR Pulmonary/Chest: Normal effort Abdominal: Soft. Non-tender, non-distended, bowel sounds are normal, no masses, organomegaly, or guarding present.  Genitourinary: Extremities: No cyanosis or edema   Lab Results:  Recent Labs  03/14/15 0454  HGB 12.3*  HCT 37.9*   BMET  Recent Labs  03/14/15 0454  NA 141  K 3.0*  CL 100*  CO2 30  GLUCOSE 117*  BUN 13  CREATININE 0.73  CALCIUM 8.3*   No results for input(s): LABPT, INR in the last 72 hours. No results for input(s): LABURIN in the last 72 hours. Results for orders placed or performed during the hospital encounter of 03/12/15  Culture, blood (routine x 2)     Status: None (Preliminary result)   Collection Time: 03/12/15  6:55 PM  Result Value Ref Range Status   Specimen Description BLOOD LEFT ARM  Final   Special Requests BOTTLES DRAWN AEROBIC AND ANAEROBIC 10CC  Final   Culture   Final    NO GROWTH 2 DAYS Performed at [facility]    Report Status PENDING  Incomplete  Culture, blood (routine x 2)     Status: None (Preliminary result)   Collection Time: 03/12/15  7:00 PM  Result Value Ref Range Status   Specimen Description BLOOD LEFT HAND  Final   Special Requests BOTTLES DRAWN AEROBIC AND ANAEROBIC 10CC  Final   Culture   Final    NO GROWTH 2 DAYS Performed at [facility]    Report  Status PENDING  Incomplete  C difficile quick scan w PCR reflex     Status: None   Collection Time: 03/14/15  4:05 PM  Result Value Ref Range Status   C Diff antigen NEGATIVE NEGATIVE Final   C Diff toxin NEGATIVE NEGATIVE Final   C Diff interpretation Negative for toxigenic C. difficile  Final   All cultures are negative, C. difficile titers are negative. Studies/Results: No results found.  Assessment/Plan:   He is doing well, on Zosyn for presumed postbiopsy resistant bacteremia. As he is afebrile and feeling better, I plan on letting him go in the morning. He will go home on Augmentin and Flomax. I would like to see him back in about a month.   LOS: 3 days   Franchot Gallo M 03/15/2015, 7:10 PM

## 2015-03-16 NOTE — Care Management Important Message (Signed)
Important Message  Patient Details  Name: Ruben Murray MRN: [id number] Date of Birth: [birth year]   Medicare Important Message Given:  Yes    Camillo Flaming 03/16/2015, 10:25 AMImportant Message  Patient Details  Name: Ruben Murray MRN: [id number] Date of Birth: [birth year]   Medicare Important Message Given:  Yes    Camillo Flaming 03/16/2015, 10:25 AM

## 2015-03-18 LAB — CULTURE, BLOOD (ROUTINE X 2)
CULTURE: NO GROWTH
Culture: NO GROWTH

## 2015-03-23 DIAGNOSIS — I10 Essential (primary) hypertension: Secondary | ICD-10-CM | POA: Diagnosis not present

## 2015-03-23 DIAGNOSIS — Z1389 Encounter for screening for other disorder: Secondary | ICD-10-CM | POA: Diagnosis not present

## 2015-03-23 DIAGNOSIS — E7800 Pure hypercholesterolemia, unspecified: Secondary | ICD-10-CM | POA: Diagnosis not present

## 2015-03-23 DIAGNOSIS — N400 Enlarged prostate without lower urinary tract symptoms: Secondary | ICD-10-CM | POA: Diagnosis not present

## 2015-03-23 DIAGNOSIS — Z0000 Encounter for general adult medical examination without abnormal findings: Secondary | ICD-10-CM | POA: Diagnosis not present

## 2015-03-23 DIAGNOSIS — L309 Dermatitis, unspecified: Secondary | ICD-10-CM | POA: Diagnosis not present

## 2015-03-23 DIAGNOSIS — M545 Low back pain: Secondary | ICD-10-CM | POA: Diagnosis not present

## 2015-03-23 DIAGNOSIS — G4733 Obstructive sleep apnea (adult) (pediatric): Secondary | ICD-10-CM | POA: Diagnosis not present

## 2015-03-23 DIAGNOSIS — R569 Unspecified convulsions: Secondary | ICD-10-CM | POA: Diagnosis not present

## 2015-03-23 NOTE — Discharge Summary (Addendum)
Physician Discharge Summary  Patient ID: Ruben Murray MRN: [id number] DOB/AGE: 1945-06-15 70 y.o.  Admit date: 03/12/2015 Discharge date: 03/16/2015  Admission Diagnoses: Fever after prostate biopsy  Discharge Diagnoses:  Active Problems:   Fever   Discharged Condition: good  Hospital Course: The patient was admitted with fever after prostate biopsy and the concern for sepsis. The was started on broad spectrum antibiotics. He remained a febrile after hospital day 2. Prior to discharge the pt was tolerating a regular diet, pain was controlled on PO pain meds, they were ambulating without difficulty, and they had normal bowel function.   Consults: None  Significant Diagnostic Studies: labs: blood culture and urine culture  Treatments: IV hydration and antibiotics: vancomycin and Zosyn  Discharge Exam: Blood pressure 128/77, pulse 65, temperature 97.6 F (36.4 C), temperature source Oral, resp. rate 18, height _0  (1.727 m), weight 123 kg (271 lb 2.7 oz), SpO2 96 %. General appearance: alert, cooperative and appears stated age Head: Normocephalic, without obvious abnormality, atraumatic Eyes: conjunctivae/corneas clear. PERRL, EOM's intact. Fundi benign. Resp: clear to auscultation bilaterally Cardio: regular rate and rhythm, S1, S2 normal, no murmur, click, rub or gallop GI: soft, non-tender; bowel sounds normal; no masses,  no organomegaly Extremities: extremities normal, atraumatic, no cyanosis or edema Neurologic: Grossly normal  Disposition: 01-Home or Self Care     Medication List    STOP taking these medications        predniSONE 50 MG tablet  Commonly known as:  DELTASONE      TAKE these medications        acetaminophen 500 MG tablet  Commonly known as:  TYLENOL  Take 1,000 mg by mouth every 6 (six) hours as needed (Pain).     albuterol 108 (90 Base) MCG/ACT inhaler  Commonly known as:  PROVENTIL HFA;VENTOLIN HFA  Inhale 2 puffs into the lungs every 2  (two) hours as needed for wheezing or shortness of breath (cough).     amLODipine 5 MG tablet  Commonly known as:  NORVASC  Take 5 mg by mouth every morning.     amoxicillin-clavulanate 500-125 MG tablet  Commonly known as:  AUGMENTIN  Take 1 tablet (500 mg total) by mouth 3 (three) times daily.     aspirin EC 81 MG tablet  Take 81 mg by mouth daily.     finasteride 5 MG tablet  Commonly known as:  PROSCAR  Take 5 mg by mouth daily.     lactobacillus acidophilus & bulgar chewable tablet  Chew 2 tablets by mouth 3 (three) times daily with meals.     loperamide 2 MG capsule  Commonly known as:  IMODIUM  Take 1 capsule (2 mg total) by mouth as needed for diarrhea or loose stools.     losartan-hydrochlorothiazide 100-12.5 MG tablet  Commonly known as:  HYZAAR  Take 1 tablet by mouth every morning.     multivitamin with minerals Tabs tablet  Take 1 tablet by mouth every morning.     PHENobarbital 97.2 MG tablet  Commonly known as:  LUMINAL  Take 97.2 mg by mouth every morning.     pravastatin 40 MG tablet  Commonly known as:  PRAVACHOL  Take 40 mg by mouth daily.     tamsulosin 0.4 MG Caps capsule  Commonly known as:  FLOMAX  Take 1 capsule (0.4 mg total) by mouth daily.           Follow-up Information    Follow up with DAHLSTEDT,  Lillette Boxer, MD.   Specialty:  Urology   Why:  call for appt to be seen in a month   Contact information:   [address] [place] [address] [phone]       Signed: Cleon Gustin 03/23/2015, 1:31 PM

## 2015-04-06 DIAGNOSIS — N3000 Acute cystitis without hematuria: Secondary | ICD-10-CM | POA: Diagnosis not present

## 2015-04-06 DIAGNOSIS — Z0000 Encounter for general adult medical examination without abnormal findings: Secondary | ICD-10-CM | POA: Diagnosis not present

## 2015-04-21 DIAGNOSIS — R972 Elevated prostate specific antigen [PSA]: Secondary | ICD-10-CM | POA: Diagnosis not present

## 2015-04-21 DIAGNOSIS — Z0000 Encounter for general adult medical examination without abnormal findings: Secondary | ICD-10-CM | POA: Diagnosis not present

## 2015-04-21 DIAGNOSIS — R351 Nocturia: Secondary | ICD-10-CM | POA: Diagnosis not present

## 2015-04-21 DIAGNOSIS — N401 Enlarged prostate with lower urinary tract symptoms: Secondary | ICD-10-CM | POA: Diagnosis not present

## 2015-04-21 DIAGNOSIS — R509 Fever, unspecified: Secondary | ICD-10-CM | POA: Diagnosis not present

## 2015-05-20 DIAGNOSIS — H524 Presbyopia: Secondary | ICD-10-CM | POA: Diagnosis not present

## 2015-05-20 DIAGNOSIS — H5210 Myopia, unspecified eye: Secondary | ICD-10-CM | POA: Diagnosis not present

## 2015-05-22 DIAGNOSIS — H524 Presbyopia: Secondary | ICD-10-CM | POA: Diagnosis not present

## 2015-05-22 DIAGNOSIS — H5213 Myopia, bilateral: Secondary | ICD-10-CM | POA: Diagnosis not present

## 2015-05-22 DIAGNOSIS — Z0100 Encounter for examination of eyes and vision without abnormal findings: Secondary | ICD-10-CM | POA: Diagnosis not present

## 2015-05-22 DIAGNOSIS — H52209 Unspecified astigmatism, unspecified eye: Secondary | ICD-10-CM | POA: Diagnosis not present

## 2015-09-20 DIAGNOSIS — Z1389 Encounter for screening for other disorder: Secondary | ICD-10-CM | POA: Diagnosis not present

## 2015-09-20 DIAGNOSIS — G4733 Obstructive sleep apnea (adult) (pediatric): Secondary | ICD-10-CM | POA: Diagnosis not present

## 2015-09-20 DIAGNOSIS — I10 Essential (primary) hypertension: Secondary | ICD-10-CM | POA: Diagnosis not present

## 2015-09-20 DIAGNOSIS — M545 Low back pain: Secondary | ICD-10-CM | POA: Diagnosis not present

## 2015-09-20 DIAGNOSIS — R569 Unspecified convulsions: Secondary | ICD-10-CM | POA: Diagnosis not present

## 2015-09-20 DIAGNOSIS — Z0000 Encounter for general adult medical examination without abnormal findings: Secondary | ICD-10-CM | POA: Diagnosis not present

## 2015-09-20 DIAGNOSIS — E7800 Pure hypercholesterolemia, unspecified: Secondary | ICD-10-CM | POA: Diagnosis not present

## 2015-09-20 DIAGNOSIS — N400 Enlarged prostate without lower urinary tract symptoms: Secondary | ICD-10-CM | POA: Diagnosis not present

## 2015-09-20 DIAGNOSIS — L309 Dermatitis, unspecified: Secondary | ICD-10-CM | POA: Diagnosis not present

## 2015-10-15 DIAGNOSIS — R972 Elevated prostate specific antigen [PSA]: Secondary | ICD-10-CM | POA: Diagnosis not present

## 2015-10-20 DIAGNOSIS — R351 Nocturia: Secondary | ICD-10-CM | POA: Diagnosis not present

## 2015-10-20 DIAGNOSIS — R972 Elevated prostate specific antigen [PSA]: Secondary | ICD-10-CM | POA: Diagnosis not present

## 2015-10-20 DIAGNOSIS — N401 Enlarged prostate with lower urinary tract symptoms: Secondary | ICD-10-CM | POA: Diagnosis not present

## 2015-11-19 DIAGNOSIS — Z23 Encounter for immunization: Secondary | ICD-10-CM | POA: Diagnosis not present

## 2016-02-04 DIAGNOSIS — J189 Pneumonia, unspecified organism: Secondary | ICD-10-CM

## 2016-02-04 HISTORY — DX: Pneumonia, unspecified organism: J18.9

## 2016-02-14 ENCOUNTER — Emergency Department (HOSPITAL_COMMUNITY): Payer: Commercial Managed Care - HMO

## 2016-02-14 ENCOUNTER — Encounter (HOSPITAL_COMMUNITY): Payer: Self-pay | Admitting: Emergency Medicine

## 2016-02-14 ENCOUNTER — Emergency Department (HOSPITAL_COMMUNITY)
Admission: EM | Admit: 2016-02-14 | Discharge: 2016-02-15 | Disposition: A | Discharge status: Home / Self Care | Payer: Commercial Managed Care - HMO | Attending: Emergency Medicine | Admitting: Emergency Medicine

## 2016-02-14 DIAGNOSIS — R079 Chest pain, unspecified: Secondary | ICD-10-CM | POA: Diagnosis not present

## 2016-02-14 DIAGNOSIS — I10 Essential (primary) hypertension: Secondary | ICD-10-CM | POA: Diagnosis not present

## 2016-02-14 DIAGNOSIS — Z96659 Presence of unspecified artificial knee joint: Secondary | ICD-10-CM | POA: Diagnosis not present

## 2016-02-14 DIAGNOSIS — Z7982 Long term (current) use of aspirin: Secondary | ICD-10-CM | POA: Insufficient documentation

## 2016-02-14 DIAGNOSIS — R109 Unspecified abdominal pain: Secondary | ICD-10-CM | POA: Diagnosis not present

## 2016-02-14 DIAGNOSIS — J209 Acute bronchitis, unspecified: Secondary | ICD-10-CM | POA: Diagnosis not present

## 2016-02-14 DIAGNOSIS — Z79899 Other long term (current) drug therapy: Secondary | ICD-10-CM | POA: Diagnosis not present

## 2016-02-14 DIAGNOSIS — Z87891 Personal history of nicotine dependence: Secondary | ICD-10-CM | POA: Insufficient documentation

## 2016-02-14 DIAGNOSIS — R1084 Generalized abdominal pain: Secondary | ICD-10-CM | POA: Diagnosis not present

## 2016-02-14 DIAGNOSIS — R1030 Lower abdominal pain, unspecified: Secondary | ICD-10-CM | POA: Diagnosis present

## 2016-02-14 DIAGNOSIS — R05 Cough: Secondary | ICD-10-CM | POA: Diagnosis not present

## 2016-02-14 DIAGNOSIS — K5900 Constipation, unspecified: Secondary | ICD-10-CM | POA: Diagnosis not present

## 2016-02-14 DIAGNOSIS — R059 Cough, unspecified: Secondary | ICD-10-CM

## 2016-02-14 LAB — COMPREHENSIVE METABOLIC PANEL
ALBUMIN: 4.5 g/dL (ref 3.5–5.0)
ALT: 19 U/L (ref 17–63)
ANION GAP: 10 (ref 5–15)
AST: 22 U/L (ref 15–41)
Alkaline Phosphatase: 132 U/L — ABNORMAL HIGH (ref 38–126)
BILIRUBIN TOTAL: 0.4 mg/dL (ref 0.3–1.2)
BUN: 20 mg/dL (ref 6–20)
CHLORIDE: 100 mmol/L — AB (ref 101–111)
CO2: 30 mmol/L (ref 22–32)
Calcium: 9.1 mg/dL (ref 8.9–10.3)
Creatinine, Ser: 1.01 mg/dL (ref 0.61–1.24)
GFR calc Af Amer: >60 mL/min (ref >60)
GFR calc non Af Amer: >60 mL/min (ref >60)
GLUCOSE: 135 mg/dL — AB (ref 65–99)
POTASSIUM: 4.1 mmol/L (ref 3.5–5.1)
SODIUM: 140 mmol/L (ref 135–145)
TOTAL PROTEIN: 7.8 g/dL (ref 6.5–8.1)

## 2016-02-14 LAB — URINALYSIS, ROUTINE W REFLEX MICROSCOPIC
Bilirubin Urine: NEGATIVE
Glucose, UA: NEGATIVE mg/dL
Hgb urine dipstick: NEGATIVE
Ketones, ur: NEGATIVE mg/dL
LEUKOCYTES UA: NEGATIVE
NITRITE: NEGATIVE
PH: 5 (ref 5.0–8.0)
Protein, ur: NEGATIVE mg/dL

## 2016-02-14 LAB — CBC
HEMATOCRIT: 42.3 % (ref 39.0–52.0)
HEMOGLOBIN: 14.7 g/dL (ref 13.0–17.0)
MCH: 31.3 pg (ref 26.0–34.0)
MCHC: 34.8 g/dL (ref 30.0–36.0)
MCV: 90.2 fL (ref 78.0–100.0)
Platelets: 233 10*3/uL (ref 150–400)
RBC: 4.69 MIL/uL (ref 4.22–5.81)
RDW: 13 % (ref 11.5–15.5)
WBC: 13.8 10*3/uL — ABNORMAL HIGH (ref 4.0–10.5)

## 2016-02-14 LAB — LIPASE, BLOOD: LIPASE: 24 U/L (ref 11–51)

## 2016-02-14 MED ORDER — SODIUM CHLORIDE 0.9 % IJ SOLN
INTRAMUSCULAR | Status: AC
  Filled 2016-02-14: qty 50

## 2016-02-14 MED ORDER — IOPAMIDOL (ISOVUE-300) INJECTION 61%
INTRAVENOUS | Status: AC
  Filled 2016-02-14: qty 100

## 2016-02-14 MED ORDER — IOPAMIDOL (ISOVUE-300) INJECTION 61%
100.0000 mL | Freq: Once | INTRAVENOUS | Status: AC | PRN
  Administered 2016-02-14: 100 mL via INTRAVENOUS

## 2016-02-14 MED ORDER — KETOROLAC TROMETHAMINE 30 MG/ML IJ SOLN
30.0000 mg | Freq: Once | INTRAMUSCULAR | Status: AC
  Administered 2016-02-14: 30 mg via INTRAVENOUS
  Filled 2016-02-14: qty 1

## 2016-02-14 NOTE — ED Notes (Signed)
Called patient for blood draw x2, no answer

## 2016-02-14 NOTE — ED Triage Notes (Addendum)
[place], c/o lower abdominal pressure and constipation. Last BM x3 days ago. Patient also reports productive cough x4 days. Denies chest pain and SOB.

## 2016-02-14 NOTE — ED Provider Notes (Signed)
Ruben Murray   CSN: [id number] Arrival date & time: 02/14/16  1750  By signing my name below, I, Ruben Murray, attest that this documentation has been prepared under the direction and in the presence [employer], PA-C. Electronically Signed: Dora Murray, Scribe. 02/14/2016. 9:46 PM.  History   Chief Complaint Chief Complaint  Patient presents with  . Abdominal Pain    The history is provided by the patient. No language interpreter was used.     HPI Comments: Ruben Murray is a 70 y.o. male brought in by EMS, with PMHx significant for HTN, who presents to the Emergency Department complaining of constant, severe, worsening, lower abdominal pain secondary to a cough for the last 3 days. He endorses abdominal pain exacerbation with palpation to his lower abdomen. He reports associated constipation as well; he had a bowel movement here tonight and that was his first in the last 3 days. He reports he noticed some bright red blood in his stool tonight. He reports some nausea/vomiting beginning tonight as well. He saw his PCP earlier today for the same and was told he may have bronchitis. No chest x-ray at his PCP's office. No h/o abdominal surgery. He notes a h/o constipation several years ago but has not experienced it since that time. He denies CP, SOB, or any other associated symptoms.   Past Medical History:  Diagnosis Date  . Hypertension   . Seizures [facility])     Patient Active Problem List   Diagnosis Date Noted  . Fever 03/12/2015    Past Surgical History:  Procedure Laterality Date  . CIRCUMCISION    . REPLACEMENT TOTAL KNEE    . TONSILLECTOMY         Home Medications    Prior to Admission medications   Medication Sig Start Date End Date Taking? Authorizing Provider  acetaminophen (TYLENOL) 500 MG tablet Take 1,000 mg by mouth every 6 (six) hours as needed (Pain).    Historical Provider, MD  albuterol (PROVENTIL HFA;VENTOLIN HFA) 108 (90  BASE) MCG/ACT inhaler Inhale 2 puffs into the lungs every 2 (two) hours as needed for wheezing or shortness of breath (cough). Patient not taking: Reported on 03/11/2015 10/17/86   Ruben Fuel, MD  amLODipine (NORVASC) 5 MG tablet Take 5 mg by mouth every morning.    Historical Provider, MD  amoxicillin-clavulanate (AUGMENTIN) 500-125 MG tablet Take 1 tablet (500 mg total) by mouth 3 (three) times daily. 03/15/15   Ruben Gallo, MD  aspirin EC 81 MG tablet Take 81 mg by mouth daily.    Historical Provider, MD  finasteride (PROSCAR) 5 MG tablet Take 5 mg by mouth daily.  02/19/15   Historical Provider, MD  lactobacillus acidophilus & bulgar (LACTINEX) chewable tablet Chew 2 tablets by mouth 3 (three) times daily with meals. 03/15/15   Ruben Gallo, MD  loperamide (IMODIUM) 2 MG capsule Take 1 capsule (2 mg total) by mouth as needed for diarrhea or loose stools. 03/15/15   Ruben Gallo, MD  losartan-hydrochlorothiazide (HYZAAR) 100-12.5 MG per tablet Take 1 tablet by mouth every morning.    Historical Provider, MD  Multiple Vitamin (MULITIVITAMIN WITH MINERALS) TABS Take 1 tablet by mouth every morning.    Historical Provider, MD  PHENobarbital (LUMINAL) 97.2 MG tablet Take 97.2 mg by mouth every morning.    Historical Provider, MD  pravastatin (PRAVACHOL) 40 MG tablet Take 40 mg by mouth daily.    Historical Provider, MD  tamsulosin (FLOMAX) 0.4 MG CAPS capsule  Take 1 capsule (0.4 mg total) by mouth daily. 03/15/15   Ruben Gallo, MD    Family History Family History  Problem Relation Age of Onset  . Family history unknown: Yes    Social History Social History  Substance Use Topics  . 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 Smokeless tobacco: Never Used  . Alcohol use Yes     Allergies   Dilaudid [hydromorphone hcl]; Morphine and related; and Tegretol [carbamazepine]   Review of Systems Review of Systems A complete 10 system review of systems was obtained and all systems are  negative except as noted in the HPI and PMH.    Physical Exam Updated Vital Signs BP 123/78 (BP Location: Left Arm)   Pulse 91   Temp 98 F (36.7 C) (Oral)   Resp 18   Ht _0  (1.727 m)   Wt 127.9 kg   SpO2 96%   BMI 42.88 kg/m   Physical Exam  Constitutional: He is oriented to person, place, and time. He appears well-developed and well-nourished. No distress.  Nontoxic appearing and pleasant.  HENT:  Head: Normocephalic and atraumatic.  Eyes: Conjunctivae and EOM are normal. No scleral icterus.  Neck: Normal range of motion.  Cardiovascular: Normal rate, regular rhythm and intact distal pulses.   Pulmonary/Chest: Effort normal. No respiratory distress. He has no wheezes. He has no rales.  Lungs clear to auscultation bilaterally. Chest expansion symmetric.  Abdominal: Soft. He exhibits no distension and no mass. There is tenderness. There is no guarding.  Mild, generalized abdominal tenderness. No masses. No peritoneal signs.  Musculoskeletal: Normal range of motion.  Neurological: He is alert and oriented to person, place, and time. He exhibits normal muscle tone. Coordination normal.  Skin: Skin is warm and dry. No rash noted. He is not diaphoretic. No erythema. No pallor.  Psychiatric: He has a normal mood and affect. His behavior is normal.  Nursing Murray and vitals reviewed.    ED Treatments / Results  Labs (all labs ordered are listed, but only abnormal results are displayed) Labs Reviewed  COMPREHENSIVE METABOLIC PANEL - Abnormal; Notable for the following:       Result Value   Chloride 100 (*)    Glucose, Bld 135 (*)    Alkaline Phosphatase 132 (*)    All other components within normal limits  CBC - Abnormal; Notable for the following:    WBC 13.8 (*)    All other components within normal limits  URINALYSIS, ROUTINE W REFLEX MICROSCOPIC - Abnormal; Notable for the following:    Specific Gravity, Urine >1.046 (*)    All other components within normal limits    LIPASE, BLOOD    EKG  EKG Interpretation None       Radiology Dg Chest 2 View  Result Date: 02/14/2016 CLINICAL DATA:  70 year old male with cough and chest pain. EXAM: CHEST  2 VIEW COMPARISON:  Chest radiograph dated 01/07/2015 FINDINGS: Two views of the chest do not demonstrate a focal consolidation. There is no pleural effusion or pneumothorax. Minimal platelike atelectasis noted at the left lung base. The cardiac silhouette is within normal limits. No acute osseous pathology identified. IMPRESSION: No active cardiopulmonary disease. Electronically Signed   By: Anner Crete M.D.   On: 02/14/2016 23:17   Ct Abdomen Pelvis W Contrast  Result Date: 02/14/2016 CLINICAL DATA:  Lower abdominal pain and constipation. EXAM: CT ABDOMEN AND PELVIS WITH CONTRAST TECHNIQUE: Multidetector CT imaging of the abdomen and pelvis was performed using the  standard protocol following bolus administration of intravenous contrast. CONTRAST:  14m ISOVUE-300 IOPAMIDOL (ISOVUE-300) INJECTION 61% COMPARISON:  None FINDINGS: Lower chest: No pulmonary nodules. No visible pleural or pericardial effusion. Hepatobiliary: Normal hepatic size and contours without focal liver lesion. No perihepatic ascites. No intra- or extrahepatic biliary dilatation. Normal gallbladder. Pancreas: Normal pancreatic contours and enhancement. No peripancreatic fluid collection or pancreatic ductal dilatation. Spleen: Normal. Adrenals/Urinary Tract: Normal adrenal glands. No hydronephrosis or solid renal mass. Stomach/Bowel: No abnormal bowel dilatation. No bowel wall thickening or adjacent fat stranding to indicate acute inflammation. No abdominal fluid collection. Normal appendix. Normal amount of colonic stool. Vascular/Lymphatic: There is atherosclerotic calcification of the non aneurysmal abdominal aorta. No abdominal or pelvic adenopathy. Reproductive: Incompletely visualized suspected small right hydrocele. Mildly enlarged  prostate. Normal seminal vesicles. Musculoskeletal: There is no bony spinal canal stenosis. No lytic or blastic lesions. Normal visualized extrathoracic and extraperitoneal soft tissues. Other: No contributory non-categorized findings. IMPRESSION: 1. No acute abnormality of the abdomen or pelvis. 2. No colonic dilatation or abnormal amount of stool. Electronically Signed   By: KUlyses JarredM.D.   On: 02/14/2016 23:13    Procedures Procedures (including critical care time)  DIAGNOSTIC STUDIES: Oxygen Saturation is 92% on RA, low by my interpretation.    COORDINATION OF CARE: 9:52 PM Discussed treatment plan with pt at bedside and pt agreed to plan.  Medications Ordered in ED Medications  iopamidol (ISOVUE-300) 61 % injection (not administered)  sodium chloride 0.9 % injection (not administered)  iopamidol (ISOVUE-300) 61 % injection 100 mL (100 mLs Intravenous Contrast Given 02/14/16 2237)  ketorolac (TORADOL) 30 MG/ML injection 30 mg (30 mg Intravenous Given 02/14/16 2219)     Initial Impression / Assessment and Plan / ED Course  I have reviewed the triage vital signs and the nursing notes.  Pertinent labs & imaging results that were available during my care of the patient were reviewed by me and considered in my medical decision making (see chart for details).  Clinical Course     70year old male presents to the emergency department for complaints of abdominal pain. He states the symptoms are aggravated with coughing. Cough began a few weeks ago and he saw his primary care doctor who diagnosed him with bronchitis. He was prescribed a Z-Pak which he has yet to start.  Patient nontoxic appearing and pleasant. He has mild, generalized tenderness to his abdomen. No focal tenderness. Workup did reveal a mild leukocytosis. Given age, a CT abdomen pelvis was performed. Imaging today is reassuring, negative for acute or emergent process.   Suspect that pain is secondary to abdominal wall  strain from prolonged coughing. The patient has been advised to start the Z-Pak prescribed to him by his primary care doctor. He was also prescribed Tessalon Perles for cough. Tylenol or ibuprofen recommended for pain. Return precautions discussed and provided. Patient discharged in stable condition with no unaddressed concerns.   Final Clinical Impressions(s) / ED Diagnoses   Final diagnoses:  Abdominal wall pain  Cough    New Prescriptions New Prescriptions   No medications on file    I personally performed the services described in this documentation, which was scribed in my presence. The recorded information has been reviewed and is accurate.      KAntonietta Breach PA-C 02/15/16 [id number]   SSherwood Gambler MD 02/15/16 [phone]

## 2016-02-14 NOTE — Discharge Instructions (Signed)
Your workup today is reassuring. There is no evidence of infectious or surgical process in your abdomen. We advise that you take the Z-Pak prescribed by your primary care doctor for management of presumed bronchitis. You may also take the Tessalon Perles previously prescribed to you for cough. Take Tylenol as needed for pain. Follow-up with your primary care doctor to ensure resolution of symptoms. Return for new or concerning symptoms.

## 2016-02-14 NOTE — ED Notes (Signed)
Called patient name for lab collection and no one responded

## 2016-02-15 NOTE — ED Notes (Signed)
Pt ambulatory and independent at discharge.  Verbalized understanding of discharge instructions 

## 2016-03-04 DIAGNOSIS — J209 Acute bronchitis, unspecified: Secondary | ICD-10-CM | POA: Diagnosis not present

## 2016-03-04 DIAGNOSIS — J069 Acute upper respiratory infection, unspecified: Secondary | ICD-10-CM | POA: Diagnosis not present

## 2016-03-06 ENCOUNTER — Emergency Department (HOSPITAL_COMMUNITY): Payer: Medicare HMO

## 2016-03-06 ENCOUNTER — Inpatient Hospital Stay (HOSPITAL_COMMUNITY)
Admission: EM | Admit: 2016-03-06 | Discharge: 2016-03-10 | DRG: 871 | Disposition: A | Discharge status: Home / Self Care | Payer: Medicare HMO | Attending: Internal Medicine | Admitting: Internal Medicine

## 2016-03-06 ENCOUNTER — Encounter (HOSPITAL_COMMUNITY): Payer: Self-pay | Admitting: Emergency Medicine

## 2016-03-06 DIAGNOSIS — Z96652 Presence of left artificial knee joint: Secondary | ICD-10-CM | POA: Diagnosis present

## 2016-03-06 DIAGNOSIS — A419 Sepsis, unspecified organism: Principal | ICD-10-CM | POA: Diagnosis present

## 2016-03-06 DIAGNOSIS — E041 Nontoxic single thyroid nodule: Secondary | ICD-10-CM

## 2016-03-06 DIAGNOSIS — R0902 Hypoxemia: Secondary | ICD-10-CM

## 2016-03-06 DIAGNOSIS — Z888 Allergy status to other drugs, medicaments and biological substances status: Secondary | ICD-10-CM | POA: Diagnosis not present

## 2016-03-06 DIAGNOSIS — I4510 Unspecified right bundle-branch block: Secondary | ICD-10-CM | POA: Diagnosis not present

## 2016-03-06 DIAGNOSIS — J181 Lobar pneumonia, unspecified organism: Secondary | ICD-10-CM

## 2016-03-06 DIAGNOSIS — Z7982 Long term (current) use of aspirin: Secondary | ICD-10-CM | POA: Diagnosis not present

## 2016-03-06 DIAGNOSIS — J9601 Acute respiratory failure with hypoxia: Secondary | ICD-10-CM | POA: Diagnosis not present

## 2016-03-06 DIAGNOSIS — Z6841 Body Mass Index (BMI) 40.0 and over, adult: Secondary | ICD-10-CM

## 2016-03-06 DIAGNOSIS — I10 Essential (primary) hypertension: Secondary | ICD-10-CM | POA: Diagnosis present

## 2016-03-06 DIAGNOSIS — Z87891 Personal history of nicotine dependence: Secondary | ICD-10-CM | POA: Diagnosis not present

## 2016-03-06 DIAGNOSIS — E784 Other hyperlipidemia: Secondary | ICD-10-CM

## 2016-03-06 DIAGNOSIS — R509 Fever, unspecified: Secondary | ICD-10-CM | POA: Diagnosis not present

## 2016-03-06 DIAGNOSIS — Z885 Allergy status to narcotic agent status: Secondary | ICD-10-CM

## 2016-03-06 DIAGNOSIS — E785 Hyperlipidemia, unspecified: Secondary | ICD-10-CM | POA: Diagnosis present

## 2016-03-06 DIAGNOSIS — R569 Unspecified convulsions: Secondary | ICD-10-CM | POA: Diagnosis present

## 2016-03-06 DIAGNOSIS — R0602 Shortness of breath: Secondary | ICD-10-CM | POA: Diagnosis not present

## 2016-03-06 DIAGNOSIS — G4733 Obstructive sleep apnea (adult) (pediatric): Secondary | ICD-10-CM | POA: Diagnosis present

## 2016-03-06 DIAGNOSIS — J189 Pneumonia, unspecified organism: Secondary | ICD-10-CM | POA: Diagnosis not present

## 2016-03-06 DIAGNOSIS — E669 Obesity, unspecified: Secondary | ICD-10-CM | POA: Diagnosis present

## 2016-03-06 DIAGNOSIS — I252 Old myocardial infarction: Secondary | ICD-10-CM

## 2016-03-06 DIAGNOSIS — H18839 Recurrent erosion of cornea, unspecified eye: Secondary | ICD-10-CM | POA: Diagnosis present

## 2016-03-06 DIAGNOSIS — R05 Cough: Secondary | ICD-10-CM | POA: Diagnosis not present

## 2016-03-06 DIAGNOSIS — E782 Mixed hyperlipidemia: Secondary | ICD-10-CM

## 2016-03-06 HISTORY — DX: Nontoxic single thyroid nodule: E04.1

## 2016-03-06 HISTORY — DX: Pneumonia, unspecified organism: J18.9

## 2016-03-06 HISTORY — DX: Essential (primary) hypertension: I10

## 2016-03-06 HISTORY — DX: Hyperlipidemia, unspecified: E78.5

## 2016-03-06 HISTORY — DX: Obstructive sleep apnea (adult) (pediatric): Z99.89

## 2016-03-06 HISTORY — DX: Obstructive sleep apnea (adult) (pediatric): G47.33

## 2016-03-06 HISTORY — DX: Acute myocardial infarction, unspecified: I21.9

## 2016-03-06 LAB — CBC WITH DIFFERENTIAL/PLATELET
Basophils Absolute: 0 10*3/uL (ref 0.0–0.1)
Basophils Relative: 0 %
Eosinophils Absolute: 0 10*3/uL (ref 0.0–0.7)
Eosinophils Relative: 0 %
HEMATOCRIT: 39 % (ref 39.0–52.0)
HEMOGLOBIN: 13.2 g/dL (ref 13.0–17.0)
LYMPHS ABS: 1.4 10*3/uL (ref 0.7–4.0)
LYMPHS PCT: 8 %
MCH: 30.7 pg (ref 26.0–34.0)
MCHC: 33.8 g/dL (ref 30.0–36.0)
MCV: 90.7 fL (ref 78.0–100.0)
MONOS PCT: 9 %
Monocytes Absolute: 1.6 10*3/uL — ABNORMAL HIGH (ref 0.1–1.0)
NEUTROS ABS: 14.2 10*3/uL — AB (ref 1.7–7.7)
NEUTROS PCT: 83 %
Platelets: 226 10*3/uL (ref 150–400)
RBC: 4.3 MIL/uL (ref 4.22–5.81)
RDW: 13.1 % (ref 11.5–15.5)
WBC: 17.1 10*3/uL — ABNORMAL HIGH (ref 4.0–10.5)

## 2016-03-06 LAB — I-STAT CHEM 8, ED
BUN: 13 mg/dL (ref 6–20)
CHLORIDE: 97 mmol/L — AB (ref 101–111)
Calcium, Ion: 1.06 mmol/L — ABNORMAL LOW (ref 1.15–1.40)
Creatinine, Ser: 0.9 mg/dL (ref 0.61–1.24)
Glucose, Bld: 164 mg/dL — ABNORMAL HIGH (ref 65–99)
HEMATOCRIT: 38 % — AB (ref 39.0–52.0)
Hemoglobin: 12.9 g/dL — ABNORMAL LOW (ref 13.0–17.0)
Potassium: 3.4 mmol/L — ABNORMAL LOW (ref 3.5–5.1)
SODIUM: 137 mmol/L (ref 135–145)
TCO2: 27 mmol/L (ref 0–100)

## 2016-03-06 LAB — I-STAT TROPONIN, ED: Troponin i, poc: 0 ng/mL (ref 0.00–0.08)

## 2016-03-06 LAB — BASIC METABOLIC PANEL
Anion gap: 15 (ref 5–15)
BUN: 11 mg/dL (ref 6–20)
CHLORIDE: 97 mmol/L — AB (ref 101–111)
CO2: 24 mmol/L (ref 22–32)
CREATININE: 0.94 mg/dL (ref 0.61–1.24)
Calcium: 8.8 mg/dL — ABNORMAL LOW (ref 8.9–10.3)
GFR calc Af Amer: >60 mL/min (ref >60)
GFR calc non Af Amer: >60 mL/min (ref >60)
Glucose, Bld: 154 mg/dL — ABNORMAL HIGH (ref 65–99)
Potassium: 3.4 mmol/L — ABNORMAL LOW (ref 3.5–5.1)
Sodium: 136 mmol/L (ref 135–145)

## 2016-03-06 LAB — URINALYSIS, ROUTINE W REFLEX MICROSCOPIC
BILIRUBIN URINE: NEGATIVE
Glucose, UA: NEGATIVE mg/dL
KETONES UR: 5 mg/dL — AB
Leukocytes, UA: NEGATIVE
NITRITE: NEGATIVE
PROTEIN: 30 mg/dL — AB
Specific Gravity, Urine: 1.033 — ABNORMAL HIGH (ref 1.005–1.030)
Squamous Epithelial / LPF: NONE SEEN
pH: 5 (ref 5.0–8.0)

## 2016-03-06 LAB — STREP PNEUMONIAE URINARY ANTIGEN: STREP PNEUMO URINARY ANTIGEN: NEGATIVE

## 2016-03-06 LAB — BRAIN NATRIURETIC PEPTIDE: B Natriuretic Peptide: 129.7 pg/mL — ABNORMAL HIGH (ref 0.0–100.0)

## 2016-03-06 MED ORDER — SODIUM CHLORIDE 0.9 % IV BOLUS (SEPSIS)
1000.0000 mL | Freq: Once | INTRAVENOUS | Status: AC
  Administered 2016-03-06: 1000 mL via INTRAVENOUS

## 2016-03-06 MED ORDER — HYDROCODONE-HOMATROPINE 5-1.5 MG/5ML PO SYRP
5.0000 mL | ORAL_SOLUTION | ORAL | Status: DC | PRN
  Administered 2016-03-06 – 2016-03-10 (×19): 5 mL via ORAL
  Filled 2016-03-06 (×19): qty 5

## 2016-03-06 MED ORDER — IOPAMIDOL (ISOVUE-370) INJECTION 76%
INTRAVENOUS | Status: AC
  Administered 2016-03-06: 100 mL
  Filled 2016-03-06: qty 100

## 2016-03-06 MED ORDER — HYDROCOD POLST-CPM POLST ER 10-8 MG/5ML PO SUER
5.0000 mL | Freq: Once | ORAL | Status: AC
  Administered 2016-03-06: 5 mL via ORAL
  Filled 2016-03-06: qty 5

## 2016-03-06 MED ORDER — ACETAMINOPHEN 500 MG PO TABS
1000.0000 mg | ORAL_TABLET | Freq: Once | ORAL | Status: AC
  Administered 2016-03-06: 1000 mg via ORAL
  Filled 2016-03-06: qty 2

## 2016-03-06 MED ORDER — LOSARTAN POTASSIUM-HCTZ 100-12.5 MG PO TABS: 1.0000 | ORAL_TABLET | Freq: Every morning | ORAL | Status: DC

## 2016-03-06 MED ORDER — TAMSULOSIN HCL 0.4 MG PO CAPS
0.4000 mg | ORAL_CAPSULE | Freq: Every day | ORAL | Status: DC
Start: 2016-03-06 — End: 2016-03-10
  Administered 2016-03-06 – 2016-03-10 (×5): 0.4 mg via ORAL
  Filled 2016-03-06 (×5): qty 1

## 2016-03-06 MED ORDER — ONDANSETRON HCL 4 MG/2ML IJ SOLN
4.0000 mg | Freq: Once | INTRAMUSCULAR | Status: AC
  Administered 2016-03-06: 4 mg via INTRAVENOUS
  Filled 2016-03-06: qty 2

## 2016-03-06 MED ORDER — ASPIRIN EC 81 MG PO TBEC
81.0000 mg | DELAYED_RELEASE_TABLET | Freq: Every day | ORAL | Status: DC
  Administered 2016-03-06 – 2016-03-10 (×5): 81 mg via ORAL
  Filled 2016-03-06 (×5): qty 1

## 2016-03-06 MED ORDER — FENTANYL CITRATE (PF) 100 MCG/2ML IJ SOLN
25.0000 ug | INTRAMUSCULAR | Status: DC | PRN
  Administered 2016-03-06 – 2016-03-07 (×3): 25 ug via INTRAVENOUS
  Filled 2016-03-06 (×3): qty 2

## 2016-03-06 MED ORDER — CEFTRIAXONE SODIUM 1 G IJ SOLR: 1.0000 g | INTRAMUSCULAR | Status: DC

## 2016-03-06 MED ORDER — ENOXAPARIN SODIUM 40 MG/0.4ML ~~LOC~~ SOLN
40.0000 mg | SUBCUTANEOUS | Status: DC
  Administered 2016-03-06 – 2016-03-09 (×4): 40 mg via SUBCUTANEOUS
  Filled 2016-03-06 (×4): qty 0.4

## 2016-03-06 MED ORDER — ENSURE ENLIVE PO LIQD
237.0000 mL | Freq: Two times a day (BID) | ORAL | Status: DC
  Administered 2016-03-06 – 2016-03-09 (×5): 237 mL via ORAL

## 2016-03-06 MED ORDER — DEXTROSE 5 % IV SOLN
500.0000 mg | Freq: Once | INTRAVENOUS | Status: AC
  Administered 2016-03-06: 500 mg via INTRAVENOUS
  Filled 2016-03-06: qty 500

## 2016-03-06 MED ORDER — HYDROCHLOROTHIAZIDE 12.5 MG PO CAPS
12.5000 mg | ORAL_CAPSULE | Freq: Every day | ORAL | Status: DC
  Administered 2016-03-06 – 2016-03-10 (×5): 12.5 mg via ORAL
  Filled 2016-03-06 (×5): qty 1

## 2016-03-06 MED ORDER — PRAVASTATIN SODIUM 40 MG PO TABS
40.0000 mg | ORAL_TABLET | Freq: Every day | ORAL | Status: DC
  Administered 2016-03-06 – 2016-03-10 (×5): 40 mg via ORAL
  Filled 2016-03-06 (×5): qty 1

## 2016-03-06 MED ORDER — AZITHROMYCIN 500 MG IV SOLR: 500.0000 mg | INTRAVENOUS | Status: DC

## 2016-03-06 MED ORDER — ALBUTEROL SULFATE (2.5 MG/3ML) 0.083% IN NEBU
2.5000 mg | INHALATION_SOLUTION | RESPIRATORY_TRACT | Status: DC | PRN
  Administered 2016-03-06 – 2016-03-10 (×10): 2.5 mg via RESPIRATORY_TRACT
  Filled 2016-03-06 (×11): qty 3

## 2016-03-06 MED ORDER — CEFTRIAXONE SODIUM 1 G IJ SOLR
1.0000 g | INTRAMUSCULAR | Status: DC
  Administered 2016-03-07 – 2016-03-10 (×4): 1 g via INTRAVENOUS
  Filled 2016-03-06 (×4): qty 10

## 2016-03-06 MED ORDER — IPRATROPIUM-ALBUTEROL 0.5-2.5 (3) MG/3ML IN SOLN
3.0000 mL | RESPIRATORY_TRACT | Status: DC | PRN
  Administered 2016-03-06: 1 mL via RESPIRATORY_TRACT
  Administered 2016-03-06 – 2016-03-10 (×7): 3 mL via RESPIRATORY_TRACT
  Filled 2016-03-06 (×9): qty 3

## 2016-03-06 MED ORDER — DEXTROMETHORPHAN POLISTIREX ER 30 MG/5ML PO SUER
60.0000 mg | Freq: Two times a day (BID) | ORAL | Status: DC | PRN
  Filled 2016-03-06: qty 10

## 2016-03-06 MED ORDER — PHENOBARBITAL 32.4 MG PO TABS
97.2000 mg | ORAL_TABLET | Freq: Every morning | ORAL | Status: DC
  Administered 2016-03-06 – 2016-03-10 (×5): 97.2 mg via ORAL
  Filled 2016-03-06 (×5): qty 3

## 2016-03-06 MED ORDER — LOSARTAN POTASSIUM 50 MG PO TABS
100.0000 mg | ORAL_TABLET | Freq: Every day | ORAL | Status: DC
  Administered 2016-03-06 – 2016-03-10 (×5): 100 mg via ORAL
  Filled 2016-03-06 (×5): qty 2

## 2016-03-06 MED ORDER — DEXTROSE 5 % IV SOLN
1.0000 g | Freq: Once | INTRAVENOUS | Status: AC
  Administered 2016-03-06: 1 g via INTRAVENOUS
  Filled 2016-03-06: qty 10

## 2016-03-06 MED ORDER — GUAIFENESIN ER 600 MG PO TB12
600.0000 mg | ORAL_TABLET | Freq: Two times a day (BID) | ORAL | Status: DC | PRN
  Administered 2016-03-06 – 2016-03-07 (×4): 600 mg via ORAL
  Filled 2016-03-06 (×4): qty 1

## 2016-03-06 MED ORDER — AMLODIPINE BESYLATE 5 MG PO TABS
5.0000 mg | ORAL_TABLET | Freq: Every morning | ORAL | Status: DC
  Administered 2016-03-06 – 2016-03-10 (×5): 5 mg via ORAL
  Filled 2016-03-06 (×5): qty 1

## 2016-03-06 MED ORDER — BENZONATATE 100 MG PO CAPS
100.0000 mg | ORAL_CAPSULE | Freq: Three times a day (TID) | ORAL | Status: DC | PRN
  Administered 2016-03-06 – 2016-03-10 (×8): 100 mg via ORAL
  Filled 2016-03-06 (×9): qty 1

## 2016-03-06 MED ORDER — FENTANYL CITRATE (PF) 100 MCG/2ML IJ SOLN: 25.0000 ug | INTRAMUSCULAR | Status: DC | PRN

## 2016-03-06 MED ORDER — DEXTROSE 5 % IV SOLN
500.0000 mg | INTRAVENOUS | Status: DC
  Administered 2016-03-07 – 2016-03-09 (×3): 500 mg via INTRAVENOUS
  Filled 2016-03-06 (×3): qty 500

## 2016-03-06 NOTE — ED Notes (Signed)
Hospitalist at bedside 

## 2016-03-06 NOTE — ED Notes (Signed)
Attempted to give report

## 2016-03-06 NOTE — H&P (Signed)
History and Physical    KEYMARION BEARMAN Murray:[id number] DOB: Apr 12, 1945 DOA: 03/06/2016   PCP: Ruben Austria, MD   Patient coming from:  Home   Chief Complaint: Shortness of breath and cough  HPI: Ruben Murray is a 71 y.o. male with medical history significant for HTN, HLD, remote seizures, and recent URI treated with ZPAck from 12/10-12/17, presenting to the ED with shortness of breath accompanied  And cough is worse over the last 4 days. Associated symptoms include fever, chest pain when coughing, and nausea with vomiting due to new course buildup. He denies hemoptysis. He reported subjective fever at home, without chills. No myalgias.   His wife is sick as well with similar symptoms.  He denies any abdominal pain. His appetite is normal. He denies any dizziness or vertigo. He denies any lower extremity swelling  No confusion was reported. Denies tobacco use. Denies history of COPD or PE    ED Course:  BP 130/75   Pulse 90   Resp 19   Ht _0  (1.727 m)   Wt 127.9 kg (282 lb)   SpO2 94%   BMI 42.88 kg/m    his oxygen was in the low 80s, requiring 5 L of O2 [place], with improvement of his respiratory status. Chest x-ray was without acute pathology CT  Angio chest showed no PE, but right upper lobe bilateral lower lobes opacities suspicious for infections such as pneumonia. Urinalysis negative white count was 17.1 with left shift. Urine and blood culture pending. He received Rocephin and Zithromax IV, 1 L of IV fluids at the ED   Review of Systems: As per HPI otherwise 10 point review of systems negative.   Past Medical History:  Diagnosis Date  . Hyperlipidemia   . Hypertension   . Seizures ([place])     Past Surgical History:  Procedure Laterality Date  . CIRCUMCISION    . REPLACEMENT TOTAL KNEE    . TONSILLECTOMY      Social History Social History   Social History  . Marital status: Married    Spouse name: Ruben Murray  . Number of children: Ruben Murray  . Years of education: Ruben Murray     Occupational History  . Not on file.   Social History Main Topics  . Smoking status: Former Research scientist (life sciences)  . Smokeless tobacco: Never Used  . Alcohol use No  . Drug use: No  . Sexual activity: No   Other Topics Concern  . Not on file   Social History Narrative  . No narrative on file     Allergies  Allergen Reactions  . Dilaudid [Hydromorphone Hcl] Other (See Comments)    Reaction:  Makes pt hyper   . Morphine And Related Other (See Comments)    Pt states that this medication makes him feel like he is out of it.    . Tegretol [Carbamazepine] Hives    Family History  Problem Relation Age of Onset  . Family history unknown: Yes      Prior to Admission medications   Medication Sig Start Date End Date Taking? Authorizing Provider  acetaminophen (TYLENOL) 500 MG tablet Take 1,000 mg by mouth every 6 (six) hours as needed (Pain).   Yes Historical Provider, MD  amLODipine (NORVASC) 5 MG tablet Take 5 mg by mouth every morning.   Yes Historical Provider, MD  aspirin EC 81 MG tablet Take 81 mg by mouth daily.   Yes Historical Provider, MD  benzonatate (TESSALON) 100 MG capsule Take 100  mg by mouth 3 (three) times daily as needed for cough.   Yes Historical Provider, MD  ipratropium (ATROVENT) 0.03 % nasal spray Place 2 sprays into both nostrils 3 (three) times daily.   Yes Historical Provider, MD  losartan-hydrochlorothiazide (HYZAAR) 100-12.5 MG per tablet Take 1 tablet by mouth every morning.   Yes Historical Provider, MD  Multiple Vitamin (MULITIVITAMIN WITH MINERALS) TABS Take 1 tablet by mouth every morning.   Yes Historical Provider, MD  PHENobarbital (LUMINAL) 97.2 MG tablet Take 97.2 mg by mouth every morning.   Yes Historical Provider, MD  pravastatin (PRAVACHOL) 40 MG tablet Take 40 mg by mouth daily.   Yes Historical Provider, MD  tamsulosin (FLOMAX) 0.4 MG CAPS capsule Take 1 capsule (0.4 mg total) by mouth daily. 03/15/15  Yes Ruben Gallo, MD  albuterol (PROVENTIL  HFA;VENTOLIN HFA) 108 (90 BASE) MCG/ACT inhaler Inhale 2 puffs into the lungs every 2 (two) hours as needed for wheezing or shortness of breath (cough). Patient not taking: Reported on 03/06/2016 80/16/55   Ruben Fuel, MD  amoxicillin-clavulanate (AUGMENTIN) 500-125 MG tablet Take 1 tablet (500 mg total) by mouth 3 (three) times daily. Patient not taking: Reported on 03/06/2016 03/15/15   Ruben Gallo, MD  lactobacillus acidophilus & bulgar (LACTINEX) chewable tablet Chew 2 tablets by mouth 3 (three) times daily with meals. Patient not taking: Reported on 03/06/2016 03/15/15   Ruben Gallo, MD  loperamide (IMODIUM) 2 MG capsule Take 1 capsule (2 mg total) by mouth as needed for diarrhea or loose stools. Patient not taking: Reported on 03/06/2016 03/15/15   Ruben Gallo, MD    Physical Exam:    Vitals:   03/06/16 0600 03/06/16 0615 03/06/16 0630 03/06/16 0645  BP: 137/71 126/77 136/83 130/75  Pulse: 103 92 96 90  Resp: _0 SpO2: 92% 98% 96% 94%  Weight:      Height:           Constitutional: NAD, calm, uncomfortable due to cough and shortness of breath  Vitals:   03/06/16 0600 03/06/16 0615 03/06/16 0630 03/06/16 0645  BP: 137/71 126/77 136/83 130/75  Pulse: 103 92 96 90  Resp: _1 SpO2: 92% 98% 96% 94%  Weight:      Height:       Eyes: PERRL, lids and conjunctivae normal ENMT: Mucous membranes are moist. Posterior pharynx clear of any exudate or lesions.Normal dentition.  Neck: normal, supple, no masses, no thyromegaly Respiratory: bilateral diffuse rhonchi with decreased breath sounds at the bases. On 4 l [place] at this time  Cardiovascular: Regular rate and rhythm, no murmurs / rubs / gallops. No extremity edema. 2+ pedal pulses. No carotid bruits.  Abdomen:  Obese, no tenderness, no masses palpated. No hepatosplenomegaly. Bowel sounds positive.  Musculoskeletal: no clubbing / cyanosis. No joint deformity upper and lower extremities. Good ROM, no  contractures. Normal muscle tone.  Skin: no rashes, lesions, ulcers.   Neurologic: CN 2-12 grossly intact. Sensation intact, DTR normal. Strength 5/5 in all 4.  Psychiatric: Normal judgment and insight. Alert and oriented x 3. Normal mood.     Labs on Admission: I have personally reviewed following labs and imaging studies  CBC:  Recent Labs Lab 03/06/16 0444 03/06/16 0448  WBC 17.1*  --   NEUTROABS 14.2*  --   HGB 13.2 12.9*  HCT 39.0 38.0*  MCV 90.7  --   PLT 226  --     Basic Metabolic Panel:  Recent  Labs Lab 03/06/16 0444 03/06/16 0448  NA 136 137  K 3.4* 3.4*  CL 97* 97*  CO2 24  --   GLUCOSE 154* 164*  BUN 11 13  CREATININE 0.94 0.90  CALCIUM 8.8*  --     GFR: Estimated Creatinine Clearance: 96.8 mL/min (by C-G formula based on SCr of 0.9 mg/dL).  Liver Function Tests: No results for input(s): AST, ALT, ALKPHOS, BILITOT, PROT, ALBUMIN in the last 168 hours. No results for input(s): LIPASE, AMYLASE in the last 168 hours. No results for input(s): AMMONIA in the last 168 hours.  Coagulation Profile: No results for input(s): INR, PROTIME in the last 168 hours.  Cardiac Enzymes: No results for input(s): CKTOTAL, CKMB, CKMBINDEX, TROPONINI in the last 168 hours.  BNP (last 3 results) No results for input(s): PROBNP in the last 8760 hours.  HbA1C: No results for input(s): HGBA1C in the last 72 hours.  CBG: No results for input(s): GLUCAP in the last 168 hours.  Lipid Profile: No results for input(s): CHOL, HDL, LDLCALC, TRIG, CHOLHDL, LDLDIRECT in the last 72 hours.  Thyroid Function Tests: No results for input(s): TSH, T4TOTAL, FREET4, T3FREE, THYROIDAB in the last 72 hours.  Anemia Panel: No results for input(s): VITAMINB12, FOLATE, FERRITIN, TIBC, IRON, RETICCTPCT in the last 72 hours.  Urine analysis:    Component Value Date/Time   COLORURINE YELLOW 03/06/2016 0740   APPEARANCEUR CLEAR 03/06/2016 0740   LABSPEC 1.033 (H) 03/06/2016 0740     PHURINE 5.0 03/06/2016 0740   GLUCOSEU NEGATIVE 03/06/2016 0740   HGBUR SMALL (A) 03/06/2016 0740   BILIRUBINUR NEGATIVE 03/06/2016 0740   KETONESUR 5 (A) 03/06/2016 0740   PROTEINUR 30 (A) 03/06/2016 0740   UROBILINOGEN 0.2 05/06/2010 1010   NITRITE NEGATIVE 03/06/2016 0740   LEUKOCYTESUR NEGATIVE 03/06/2016 0740    Sepsis Labs: _0 (procalcitonin:4,lacticidven:4) ) Recent Results (from the past 240 hour(s))  Blood culture (routine x 2)     Status: None (Preliminary result)   Collection Time: 03/06/16  5:00 AM  Result Value Ref Range Status   Specimen Description BLOOD RIGHT HAND  Final   Special Requests BOTTLES DRAWN AEROBIC AND ANAEROBIC 5CC  Final   Culture PENDING  Incomplete   Report Status PENDING  Incomplete     Radiological Exams on Admission: Dg Chest 2 View  Result Date: 03/06/2016 CLINICAL DATA:  Dyspnea, cough and fever for 4 days. EXAM: CHEST  2 VIEW COMPARISON:  02/14/2016 FINDINGS: Stable curvilinear scarring in the left base. The lungs are otherwise clear. There is borderline cardiomegaly, unchanged. The pulmonary vasculature is normal. There is no pleural effusion. Hilar and mediastinal contours are unremarkable and unchanged. IMPRESSION: No acute cardiopulmonary findings.  Stable borderline cardiomegaly. Electronically Signed   By: Andreas Newport M.D.   On: 03/06/2016 05:30   Ct Angio Chest Pe W And/or Wo Contrast  Result Date: 03/06/2016 CLINICAL DATA:  Patient has been coughing for 5 days, chest pain, upper abdomen pain and shortness of breath. 100 cc of isovue 370 EXAM: CT ANGIOGRAPHY CHEST WITH CONTRAST TECHNIQUE: Multidetector CT imaging of the chest was performed using the standard protocol during bolus administration of intravenous contrast. Multiplanar CT image reconstructions and MIPs were obtained to evaluate the vascular anatomy. CONTRAST:  100 cc Isovue 370 COMPARISON:  Chest x-ray 03/06/2016 FINDINGS: Cardiovascular: Heart size is normal. No  imaged pericardial effusion or significant coronary artery calcifications. Pulmonary arteries are only moderately well opacified by contrast bolus. However there is no evidence for acute pulmonary  embolus to level of the lobar branches. There is atherosclerotic calcification of the thoracic aorta. No evidence for aneurysm. Mediastinum/Nodes: A 12 mm low-attenuation lesion is identified within the right lobe of the thyroid, benign in appearance. Small mediastinal and hilar lymph nodes are present. Right paratracheal lymph node is 1.0 cm. Subcarinal lymph node is 1.4 cm. Largest right hilar lymph node is 1.5 cm. Largest left hilar lymph node is 1.3 cm. Lungs/Pleura: Study quality is degraded by patient motion artifact.There are reticulonodular opacities within the lungs, within the right upper lobe in lower lobes bilaterally, right greater than left. Streaky densities are also identified in the lower lobes bilaterally consistent with atelectasis. No pleural effusions or consolidations. Upper Abdomen: The gallbladder is present. Musculoskeletal: No chest wall abnormality. No acute or significant osseous findings. Review of the MIP images confirms the above findings. IMPRESSION: 1. Exam is moderately adequate for evaluation of pulmonary arteries. No large or medium size emboli are identified. Study is not adequate to exclude smaller pulmonary emboli. 2. Reticulonodular opacities in the right upper lobe and both lower lobes, likely indicating infectious process. Bilateral lower lobe atelectasis. 3. Small bilateral hilar and mediastinal lymph nodes may be reactive. 4. Benign-appearing nodule in the right lobe of the thyroid. No further evaluation is necessary based on consensus criteria. Electronically Signed   By: Nolon Nations M.D.   On: 03/06/2016 07:50    EKG: Independently reviewed.  Assessment/Plan Active Problems:   Recurrent erosion of cornea   Community acquired pneumonia   Hypertension    Hyperlipidemia   Right thyroid nodule   Seizures (HCC)   Acute Respiratory Failure with Hypoxia likely due to  CAP Presenting now with  progressive symptoms including non productive cough, subjective fevers. Symptoms are following a recent URI treated as OP with Z pack.  CT angio neg for PE, with findings suspicious for B LL and RUL pneumonia with likely benign small bilateral hilar and mediastinal lymph nodes  WBC  17.1 with L shift . Received Rocephin and ZIthromax IV  Admit to Med Surg Obs  Pneumonia order set  Will try to wean Oxygen   Sputum and blood cultures  IVRocephin and Zithromax  with per protocol Procalcitonin,Strep pneumo urine antigen, Legionella urine antigen Nebulizers as needed, with Duoneb q 4 prn and Albuterol q 2 h prn Mucinex prn  Continue tessalon pearles  Antipyretics prn  Repeat CBC in am CXR in am    Hypertension BP 130/75   Pulse 90   Continue home anti-hypertensive medications   Hyperlipidemia Continue home statins  History of seizures C0ntinue phenobarbital   Right lobe of the thyroid nodule Per CT report, appears benign. Can follow with PCP as OP   DVT prophylaxis: Lovenox    Code Status:   Full     Family Communication:  Discussed with patient and wife  Disposition Plan: Expect patient to be discharged to home after condition improves Consults called:    None Admission status:   Obs  Medsurg     Sharene Butters E, PA-C [facility]   03/06/2016, 9:13 AM

## 2016-03-06 NOTE — ED Provider Notes (Signed)
[place] DEPT Provider Note   CSN: [id number] Arrival date & time: 03/06/16  0421     History   Chief Complaint Chief Complaint  Patient presents with  . Shortness of Breath  . Cough    HPI Ruben Murray is a 71 y.o. male.  71 yo M with a chief complaint of cough fever and shortness of breath. This been going on for the past week or so. He was seen last week and diagnosed with possible early pneumonia and given a Z-Pak. He has finished this medication and is continued to worsen. Has had trouble eating and drinking over the past couple days due to nausea and vomiting. Has some abdominal pain and chest pain with coughing. Denies pain otherwise. Having subjective fevers and chills at home. Some diaphoresis that he says he gets sweaty when he takes Tylenol. Has been taking Tylenol for his fever some improvement.   The history is provided by the patient.  Shortness of Breath  This is a new problem. The average episode lasts 4 days. The problem occurs continuously.The current episode started more than 2 days ago. The problem has been gradually worsening. Associated symptoms include a fever, cough, chest pain (with coughing) and vomiting. Pertinent negatives include no headaches, no abdominal pain and no rash. He has tried nothing for the symptoms. The treatment provided no relief. Associated medical issues include pneumonia and past MI (remote). Associated medical issues do not include asthma, COPD or PE.  Cough  Associated symptoms include chest pain (with coughing) and shortness of breath. Pertinent negatives include no chills, no headaches and no myalgias. His past medical history is significant for pneumonia. His past medical history does not include COPD or asthma.    Past Medical History:  Diagnosis Date  . CAP (community acquired pneumonia) 03/06/2016  . Hyperlipidemia   . Hypertension   . Myocardial infarction 1980-early 2000s X 2   "mild ones" (03/06/2016)  . OSA on CPAP      "not wearing it right now; need to replace parts" (03/06/2016)  . Pneumonia 02/2016  . Pneumonia    "when I was a kid" (03/06/2016)  . Seizures ([place])    "take RX daily" (03/06/2016)    Patient Active Problem List   Diagnosis Date Noted  . CAP (community acquired pneumonia) 03/07/2016  . Community acquired pneumonia 03/06/2016  . Hypertension 03/06/2016  . Hyperlipidemia 03/06/2016  . Right thyroid nodule 03/06/2016  . Seizures ([place]) 03/06/2016  . Fever 03/12/2015  . 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 CIRCUMCISION    . JOINT REPLACEMENT    . PERCUTANEOUS PINNING TOE FRACTURE Left    "big toe  . REPLACEMENT TOTAL KNEE Left   . TONSILLECTOMY AND ADENOIDECTOMY         Home Medications    Prior to Admission medications   Medication Sig Start Date End Date Taking? Authorizing Provider  acetaminophen (TYLENOL) 500 MG tablet Take 1,000 mg by mouth every 6 (six) hours as needed (Pain).   Yes Historical Provider, MD  amLODipine (NORVASC) 5 MG tablet Take 5 mg by mouth every morning.   Yes Historical Provider, MD  aspirin EC 81 MG tablet Take 81 mg by mouth daily.   Yes Historical Provider, MD  benzonatate (TESSALON) 100 MG capsule Take 100 mg by mouth 3 (three) times daily as needed for cough.   Yes Historical Provider, MD  ipratropium (ATROVENT) 0.03 % nasal spray  Place 2 sprays into both nostrils 3 (three) times daily.   Yes Historical Provider, MD  losartan-hydrochlorothiazide (HYZAAR) 100-12.5 MG per tablet Take 1 tablet by mouth every morning.   Yes Historical Provider, MD  Multiple Vitamin (MULITIVITAMIN WITH MINERALS) TABS Take 1 tablet by mouth every morning.   Yes Historical Provider, MD  PHENobarbital (LUMINAL) 97.2 MG tablet Take 97.2 mg by mouth every morning.   Yes Historical Provider, MD  pravastatin (PRAVACHOL) 40 MG tablet Take 40 mg by mouth daily.   Yes Historical Provider, MD  tamsulosin (FLOMAX) 0.4 MG  CAPS capsule Take 1 capsule (0.4 mg total) by mouth daily. 03/15/15  Yes Franchot Gallo, MD  albuterol (PROVENTIL HFA;VENTOLIN HFA) 108 (90 BASE) MCG/ACT inhaler Inhale 2 puffs into the lungs every 2 (two) hours as needed for wheezing or shortness of breath (cough). Patient not taking: Reported on 03/06/2016 46/50/35   Delora Fuel, MD  amoxicillin-clavulanate (AUGMENTIN) 500-125 MG tablet Take 1 tablet (500 mg total) by mouth 3 (three) times daily. Patient not taking: Reported on 03/06/2016 03/15/15   Franchot Gallo, MD  lactobacillus acidophilus & bulgar (LACTINEX) chewable tablet Chew 2 tablets by mouth 3 (three) times daily with meals. Patient not taking: Reported on 03/06/2016 03/15/15   Franchot Gallo, MD  loperamide (IMODIUM) 2 MG capsule Take 1 capsule (2 mg total) by mouth as needed for diarrhea or loose stools. Patient not taking: Reported on 03/06/2016 03/15/15   Franchot Gallo, MD    Family History Family History  Problem Relation Age of Onset  . Family history unknown: Yes    Social History Social History  Substance Use Topics  . Smoking status: Former Smoker    Packs/day: 3.00    Years: 48.00    Types: Cigarettes    Quit date: 2000  . Smokeless tobacco: Never Used  . Alcohol use No     Allergies   Dilaudid [hydromorphone hcl]; Morphine and related; and Tegretol [carbamazepine]   Review of Systems Review of Systems  Constitutional: Positive for fever. Negative for chills.  HENT: Positive for congestion. Negative for facial swelling.   Eyes: Negative for discharge and visual disturbance.  Respiratory: Positive for cough and shortness of breath.   Cardiovascular: Positive for chest pain (with coughing). Negative for palpitations.  Gastrointestinal: Positive for nausea and vomiting. Negative for abdominal pain and diarrhea.  Musculoskeletal: Negative for arthralgias and myalgias.  Skin: Negative for color change and rash.  Neurological: Negative for tremors, syncope  and headaches.  Psychiatric/Behavioral: Negative for confusion and dysphoric mood.     Physical Exam Updated Vital Signs BP (!) 147/67 (BP Location: Right Arm)   Pulse 95   Temp 99.4 F (37.4 C) (Oral)   Resp 20   Ht _0  (1.727 m)   Wt 282 lb (127.9 kg)   SpO2 94%   BMI 42.88 kg/m   Physical Exam  Constitutional: He is oriented to person, place, and time. He appears well-developed and well-nourished.  HENT:  Head: Normocephalic and atraumatic.  Eyes: EOM are normal. Pupils are equal, round, and reactive to light.  Neck: Normal range of motion. Neck supple. No JVD present.  Cardiovascular: Normal rate and regular rhythm.  Exam reveals no gallop and no friction rub.   No murmur heard. Pulmonary/Chest: No respiratory distress. He has no wheezes. He exhibits no tenderness.  Scattered rhonchi   Abdominal: He exhibits no distension and no mass. There is no tenderness. There is no rebound and no guarding.  Musculoskeletal: Normal  range of motion. He exhibits no edema.  Neurological: He is alert and oriented to person, place, and time.  Skin: No rash noted. No pallor.  Psychiatric: He has a normal mood and affect. His behavior is normal.  Nursing note and vitals reviewed.    ED Treatments / Results  Labs (all labs ordered are listed, but only abnormal results are displayed) Labs Reviewed  URINE CULTURE - Abnormal; Notable for the following:       Result Value   Culture <10,000 COLONIES/mL INSIGNIFICANT GROWTH (*)    All other components within normal limits  CBC WITH DIFFERENTIAL/PLATELET - Abnormal; Notable for the following:    WBC 17.1 (*)    Neutro Abs 14.2 (*)    Monocytes Absolute 1.6 (*)    All other components within normal limits  BASIC METABOLIC PANEL - Abnormal; Notable for the following:    Potassium 3.4 (*)    Chloride 97 (*)    Glucose, Bld 154 (*)    Calcium 8.8 (*)    All other components within normal limits  URINALYSIS, ROUTINE W REFLEX MICROSCOPIC  - Abnormal; Notable for the following:    Specific Gravity, Urine 1.033 (*)    Hgb urine dipstick SMALL (*)    Ketones, ur 5 (*)    Protein, ur 30 (*)    Bacteria, UA FEW (*)    All other components within normal limits  BRAIN NATRIURETIC PEPTIDE - Abnormal; Notable for the following:    B Natriuretic Peptide 129.7 (*)    All other components within normal limits  COMPREHENSIVE METABOLIC PANEL - Abnormal; Notable for the following:    Sodium 133 (*)    Potassium 2.9 (*)    Chloride 96 (*)    Glucose, Bld 112 (*)    Calcium 8.0 (*)    Total Protein 5.2 (*)    Albumin 3.0 (*)    ALT 16 (*)    All other components within normal limits  CBC - Abnormal; Notable for the following:    WBC 13.3 (*)    RBC 3.76 (*)    Hemoglobin 11.3 (*)    HCT 34.6 (*)    All other components within normal limits  BASIC METABOLIC PANEL - Abnormal; Notable for the following:    Chloride 96 (*)    Glucose, Bld 121 (*)    Calcium 8.4 (*)    All other components within normal limits  CBC - Abnormal; Notable for the following:    WBC 13.7 (*)    RBC 3.96 (*)    Hemoglobin 12.1 (*)    HCT 36.6 (*)    All other components within normal limits  I-STAT CHEM 8, ED - Abnormal; Notable for the following:    Potassium 3.4 (*)    Chloride 97 (*)    Glucose, Bld 164 (*)    Calcium, Ion 1.06 (*)    Hemoglobin 12.9 (*)    HCT 38.0 (*)    All other components within normal limits  CULTURE, BLOOD (ROUTINE X 2)  CULTURE, BLOOD (ROUTINE X 2)  CULTURE, EXPECTORATED SPUTUM-ASSESSMENT  CULTURE, RESPIRATORY (NON-EXPECTORATED)  URINE CULTURE  LEGIONELLA PNEUMOPHILA SEROGP 1 UR AG  STREP PNEUMONIAE URINARY ANTIGEN  INFLUENZA PANEL BY PCR (TYPE A & B, H1N1)  I-STAT TROPOININ, ED    EKG  EKG Interpretation  Date/Time:  Monday March 06 2016 04:33:34 EST Ventricular Rate:  102 PR Interval:    QRS Duration: 151 QT Interval:  342 QTC Calculation:  446 R Axis:   -126 Text Interpretation:  Sinus tachycardia  Ventricular premature complex Right bundle branch block Confirmed by RAY MD, Andee Poles ([id number]) on 03/07/2016 5:18:05 PM       Radiology No results found.  Procedures Procedures (including critical care time)  Medications Ordered in ED Medications  benzonatate (TESSALON) capsule 100 mg (100 mg Oral Given 03/08/16 0844)  tamsulosin (FLOMAX) capsule 0.4 mg (0.4 mg Oral Given 03/08/16 1006)  pravastatin (PRAVACHOL) tablet 40 mg (40 mg Oral Given 03/08/16 1006)  amLODipine (NORVASC) tablet 5 mg (5 mg Oral Given 03/08/16 1006)  aspirin EC tablet 81 mg (81 mg Oral Given 03/08/16 1006)  PHENobarbital (LUMINAL) tablet 97.2 mg (97.2 mg Oral Given 03/08/16 1007)  enoxaparin (LOVENOX) injection 40 mg (40 mg Subcutaneous Given 03/08/16 1204)  azithromycin (ZITHROMAX) 500 mg in dextrose 5 % 250 mL IVPB (500 mg Intravenous Given 03/09/16 0644)  cefTRIAXone (ROCEPHIN) 1 g in dextrose 5 % 50 mL IVPB (1 g Intravenous Given 03/09/16 0542)  ipratropium-albuterol (DUONEB) 0.5-2.5 (3) MG/3ML nebulizer solution 3 mL (3 mLs Nebulization Given 03/09/16 0535)  albuterol (PROVENTIL) (2.5 MG/3ML) 0.083% nebulizer solution 2.5 mg (2.5 mg Nebulization Given 03/09/16 0041)  losartan (COZAAR) tablet 100 mg (100 mg Oral Given 03/08/16 1006)    And  hydrochlorothiazide (MICROZIDE) capsule 12.5 mg (12.5 mg Oral Given 03/08/16 1006)  HYDROcodone-homatropine (HYCODAN) 5-1.5 MG/5ML syrup 5 mL (5 mLs Oral Given 03/09/16 0535)  feeding supplement (ENSURE ENLIVE) (ENSURE ENLIVE) liquid 237 mL (237 mLs Oral Given 03/08/16 1803)  morphine 2 MG/ML injection 1 mg (1 mg Intravenous Given 03/08/16 1203)  polyethylene glycol (MIRALAX / GLYCOLAX) packet 17 g (17 g Oral Given 03/08/16 1007)  guaiFENesin (MUCINEX) 12 hr tablet 1,200 mg (1,200 mg Oral Given 03/08/16 2106)  sodium chloride 0.9 % bolus 1,000 mL (0 mLs Intravenous Stopped 03/06/16 0626)  ondansetron (ZOFRAN) injection 4 mg (4 mg Intravenous Given 03/06/16 0457)  cefTRIAXone (ROCEPHIN) 1 g in dextrose 5 % 50 mL  IVPB (0 g Intravenous Stopped 03/06/16 0827)  azithromycin (ZITHROMAX) 500 mg in dextrose 5 % 250 mL IVPB (0 mg Intravenous Stopped 03/06/16 0936)  chlorpheniramine-HYDROcodone (TUSSIONEX) 10-8 MG/5ML suspension 5 mL (5 mLs Oral Given 03/06/16 0648)  iopamidol (ISOVUE-370) 76 % injection (100 mLs  Contrast Given 03/06/16 0701)  acetaminophen (TYLENOL) tablet 1,000 mg (1,000 mg Oral Given 03/06/16 0750)  potassium chloride SA (K-DUR,KLOR-CON) CR tablet 40 mEq (40 mEq Oral Given 03/07/16 2203)     Initial Impression / Assessment and Plan / ED Course  I have reviewed the triage vital signs and the nursing notes.  Pertinent labs & imaging results that were available during my care of the patient were reviewed by me and considered in my medical decision making (see chart for details).  Clinical Course     71 yo M With a chief complaints of shortness of breath. Upon EMS arrival his oxygen was in the mid to low 80s and required 5 L of oxygen to improve it. Diffuse rhonchi on my exam. Suspect pneumonia. We'll obtain a chest x-ray.  Chest x-ray with no acute pathology. This patient has no explanation for his hypoxia will obtain a CT angios of the chest.  The patients results and plan were reviewed and discussed.   Any x-rays performed were independently reviewed by myself.   Differential diagnosis were considered with the presenting HPI.  Medications  benzonatate (TESSALON) capsule 100 mg (100 mg Oral Given 03/08/16 0844)  tamsulosin (FLOMAX) capsule 0.4 mg (  0.4 mg Oral Given 03/08/16 1006)  pravastatin (PRAVACHOL) tablet 40 mg (40 mg Oral Given 03/08/16 1006)  amLODipine (NORVASC) tablet 5 mg (5 mg Oral Given 03/08/16 1006)  aspirin EC tablet 81 mg (81 mg Oral Given 03/08/16 1006)  PHENobarbital (LUMINAL) tablet 97.2 mg (97.2 mg Oral Given 03/08/16 1007)  enoxaparin (LOVENOX) injection 40 mg (40 mg Subcutaneous Given 03/08/16 1204)  azithromycin (ZITHROMAX) 500 mg in dextrose 5 % 250 mL IVPB (500 mg Intravenous  Given 03/09/16 0644)  cefTRIAXone (ROCEPHIN) 1 g in dextrose 5 % 50 mL IVPB (1 g Intravenous Given 03/09/16 0542)  ipratropium-albuterol (DUONEB) 0.5-2.5 (3) MG/3ML nebulizer solution 3 mL (3 mLs Nebulization Given 03/09/16 0535)  albuterol (PROVENTIL) (2.5 MG/3ML) 0.083% nebulizer solution 2.5 mg (2.5 mg Nebulization Given 03/09/16 0041)  losartan (COZAAR) tablet 100 mg (100 mg Oral Given 03/08/16 1006)    And  hydrochlorothiazide (MICROZIDE) capsule 12.5 mg (12.5 mg Oral Given 03/08/16 1006)  HYDROcodone-homatropine (HYCODAN) 5-1.5 MG/5ML syrup 5 mL (5 mLs Oral Given 03/09/16 0535)  feeding supplement (ENSURE ENLIVE) (ENSURE ENLIVE) liquid 237 mL (237 mLs Oral Given 03/08/16 1803)  morphine 2 MG/ML injection 1 mg (1 mg Intravenous Given 03/08/16 1203)  polyethylene glycol (MIRALAX / GLYCOLAX) packet 17 g (17 g Oral Given 03/08/16 1007)  guaiFENesin (MUCINEX) 12 hr tablet 1,200 mg (1,200 mg Oral Given 03/08/16 2106)  sodium chloride 0.9 % bolus 1,000 mL (0 mLs Intravenous Stopped 03/06/16 0626)  ondansetron (ZOFRAN) injection 4 mg (4 mg Intravenous Given 03/06/16 0457)  cefTRIAXone (ROCEPHIN) 1 g in dextrose 5 % 50 mL IVPB (0 g Intravenous Stopped 03/06/16 0827)  azithromycin (ZITHROMAX) 500 mg in dextrose 5 % 250 mL IVPB (0 mg Intravenous Stopped 03/06/16 0936)  chlorpheniramine-HYDROcodone (TUSSIONEX) 10-8 MG/5ML suspension 5 mL (5 mLs Oral Given 03/06/16 0648)  iopamidol (ISOVUE-370) 76 % injection (100 mLs  Contrast Given 03/06/16 0701)  acetaminophen (TYLENOL) tablet 1,000 mg (1,000 mg Oral Given 03/06/16 0750)  potassium chloride SA (K-DUR,KLOR-CON) CR tablet 40 mEq (40 mEq Oral Given 03/07/16 2203)    Vitals:   03/08/16 0852 03/08/16 1315 03/08/16 2107 03/09/16 0551  BP: 139/75 133/72 (!) 141/72 (!) 147/67  Pulse: 88 96 100 95  Resp: _0 Temp: 98.4 F (36.9 C) 99.7 F (37.6 C) 99.5 F (37.5 C) 99.4 F (37.4 C)  TempSrc: Oral Oral Oral Oral  SpO2: 91% 94% 93% 94%  Weight:      Height:        Final  diagnoses:  Hypoxia  Community acquired pneumonia, unspecified laterality      Final Clinical Impressions(s) / ED Diagnoses   Final diagnoses:  Hypoxia  Community acquired pneumonia, unspecified laterality    New Prescriptions Current Discharge Medication List       Deno Etienne, DO 03/09/16 1694

## 2016-03-06 NOTE — ED Triage Notes (Signed)
Pt in from home via [facility] with sob, cough and fever x 4 days. Pt has bilateral rhonchi present, c/o coughing up white sputum, fever of 100.3 PTA, took 2 Tyl, recheck 99.7. Per EMS, pt dx with bronchitis on 12/10, finished Z-pack on 12/17. Hx of MI, HTN, seizures. 145/75, 24RR, 97% on 5L HFNC, CBG 169.

## 2016-03-06 NOTE — ED Notes (Signed)
Assisted pt to [facility], [place] for an urine sample.

## 2016-03-06 NOTE — ED Provider Notes (Signed)
Care was taken over from Dr. Tyrone Nine. CT scan shows no evidence of pulmonary embolus. There is evidence of pneumonia. He was given antibiotics for community-acquired pneumonia. He is oxygen requiring. I consulted the hospitalist who will admit the patient for further treatment.   Malvin Johns, MD 03/06/16 [phone]

## 2016-03-06 NOTE — ED Notes (Signed)
Patient transported to CT 

## 2016-03-07 ENCOUNTER — Observation Stay (HOSPITAL_COMMUNITY): Payer: Medicare HMO

## 2016-03-07 DIAGNOSIS — R0602 Shortness of breath: Secondary | ICD-10-CM | POA: Diagnosis present

## 2016-03-07 DIAGNOSIS — Z885 Allergy status to narcotic agent status: Secondary | ICD-10-CM | POA: Diagnosis not present

## 2016-03-07 DIAGNOSIS — J189 Pneumonia, unspecified organism: Secondary | ICD-10-CM | POA: Diagnosis present

## 2016-03-07 DIAGNOSIS — E041 Nontoxic single thyroid nodule: Secondary | ICD-10-CM | POA: Diagnosis present

## 2016-03-07 DIAGNOSIS — I252 Old myocardial infarction: Secondary | ICD-10-CM | POA: Diagnosis not present

## 2016-03-07 DIAGNOSIS — I4510 Unspecified right bundle-branch block: Secondary | ICD-10-CM | POA: Diagnosis present

## 2016-03-07 DIAGNOSIS — G4733 Obstructive sleep apnea (adult) (pediatric): Secondary | ICD-10-CM | POA: Diagnosis present

## 2016-03-07 DIAGNOSIS — J9601 Acute respiratory failure with hypoxia: Secondary | ICD-10-CM | POA: Diagnosis present

## 2016-03-07 DIAGNOSIS — A419 Sepsis, unspecified organism: Secondary | ICD-10-CM | POA: Diagnosis present

## 2016-03-07 DIAGNOSIS — Z6841 Body Mass Index (BMI) 40.0 and over, adult: Secondary | ICD-10-CM | POA: Diagnosis not present

## 2016-03-07 DIAGNOSIS — E785 Hyperlipidemia, unspecified: Secondary | ICD-10-CM | POA: Diagnosis present

## 2016-03-07 DIAGNOSIS — R569 Unspecified convulsions: Secondary | ICD-10-CM | POA: Diagnosis present

## 2016-03-07 DIAGNOSIS — I10 Essential (primary) hypertension: Secondary | ICD-10-CM | POA: Diagnosis present

## 2016-03-07 DIAGNOSIS — Z96652 Presence of left artificial knee joint: Secondary | ICD-10-CM | POA: Diagnosis present

## 2016-03-07 DIAGNOSIS — Z87891 Personal history of nicotine dependence: Secondary | ICD-10-CM | POA: Diagnosis not present

## 2016-03-07 DIAGNOSIS — R05 Cough: Secondary | ICD-10-CM | POA: Diagnosis not present

## 2016-03-07 DIAGNOSIS — Z7982 Long term (current) use of aspirin: Secondary | ICD-10-CM | POA: Diagnosis not present

## 2016-03-07 DIAGNOSIS — E669 Obesity, unspecified: Secondary | ICD-10-CM | POA: Diagnosis present

## 2016-03-07 DIAGNOSIS — Z888 Allergy status to other drugs, medicaments and biological substances status: Secondary | ICD-10-CM | POA: Diagnosis not present

## 2016-03-07 LAB — EXPECTORATED SPUTUM ASSESSMENT W GRAM STAIN, RFLX TO RESP C

## 2016-03-07 LAB — COMPREHENSIVE METABOLIC PANEL
ALT: 16 U/L — AB (ref 17–63)
AST: 20 U/L (ref 15–41)
Albumin: 3 g/dL — ABNORMAL LOW (ref 3.5–5.0)
Alkaline Phosphatase: 79 U/L (ref 38–126)
Anion gap: 11 (ref 5–15)
BILIRUBIN TOTAL: 0.4 mg/dL (ref 0.3–1.2)
BUN: 16 mg/dL (ref 6–20)
CHLORIDE: 96 mmol/L — AB (ref 101–111)
CO2: 26 mmol/L (ref 22–32)
CREATININE: 0.89 mg/dL (ref 0.61–1.24)
Calcium: 8 mg/dL — ABNORMAL LOW (ref 8.9–10.3)
GFR calc Af Amer: >60 mL/min (ref >60)
GLUCOSE: 112 mg/dL — AB (ref 65–99)
Potassium: 2.9 mmol/L — ABNORMAL LOW (ref 3.5–5.1)
Sodium: 133 mmol/L — ABNORMAL LOW (ref 135–145)
Total Protein: 5.2 g/dL — ABNORMAL LOW (ref 6.5–8.1)

## 2016-03-07 LAB — CBC
HCT: 34.6 % — ABNORMAL LOW (ref 39.0–52.0)
Hemoglobin: 11.3 g/dL — ABNORMAL LOW (ref 13.0–17.0)
MCH: 30.1 pg (ref 26.0–34.0)
MCHC: 32.7 g/dL (ref 30.0–36.0)
MCV: 92 fL (ref 78.0–100.0)
PLATELETS: 201 10*3/uL (ref 150–400)
RBC: 3.76 MIL/uL — ABNORMAL LOW (ref 4.22–5.81)
RDW: 13.1 % (ref 11.5–15.5)
WBC: 13.3 10*3/uL — AB (ref 4.0–10.5)

## 2016-03-07 LAB — INFLUENZA PANEL BY PCR (TYPE A & B)
Influenza A By PCR: NEGATIVE
Influenza B By PCR: NEGATIVE

## 2016-03-07 LAB — URINE CULTURE

## 2016-03-07 LAB — LEGIONELLA PNEUMOPHILA SEROGP 1 UR AG: L. PNEUMOPHILA SEROGP 1 UR AG: NEGATIVE

## 2016-03-07 MED ORDER — POTASSIUM CHLORIDE CRYS ER 20 MEQ PO TBCR
40.0000 meq | EXTENDED_RELEASE_TABLET | Freq: Two times a day (BID) | ORAL | Status: AC
  Administered 2016-03-07 (×2): 40 meq via ORAL
  Filled 2016-03-07 (×2): qty 2

## 2016-03-07 MED ORDER — MORPHINE SULFATE (PF) 2 MG/ML IV SOLN
1.0000 mg | INTRAVENOUS | Status: DC | PRN
  Administered 2016-03-07 – 2016-03-08 (×2): 1 mg via INTRAVENOUS
  Filled 2016-03-07 (×2): qty 1

## 2016-03-07 NOTE — Progress Notes (Addendum)
Initial Nutrition Assessment  DOCUMENTATION CODES:   Obesity unspecified  INTERVENTION:   Ensure Enlive po BID, each supplement provides 350 kcal and 20 grams of protein  NUTRITION DIAGNOSIS:   Inadequate oral intake related to acute illness as evidenced by per patient/family report.  GOAL:   Patient will meet greater than or equal to 90% of their needs  MONITOR:   PO intake, Supplement acceptance  REASON FOR ASSESSMENT:   Malnutrition Screening Tool    ASSESSMENT:   71 y.o. male with medical history significant for HTN, HLD, remote seizures, and recent URI treated with ZPAck from 12/10-12/17, presenting to the ED with shortness of breath accompanied  And cough is worse over the last 4 days. Associated symptoms include fever, chest pain when coughing, and nausea with vomiting due to new course buildup. Pt admitted for CAP.    Met with pt in room today. Pt reports poor appetite for 3 days pta. Pt currently eating 75% meals. Pt chart, pt has gained wt. Pt reports constipation 1 week ago but resolved now. Pts potassium 2.9 today. Pt received potassium chloride tablet today. Pt scheduled to have labs tomorrow.    Medications reviewed and include: aspirin, zithromax, lovenox, ceftriaxone, phenobarbitol, KCl, fentanyl, hydrocodone   Labs reviewed: Na 133(L), K 2.9(L), Cl 96(L), Ca 8.0(L) adj. 8.8(L), Alb 3.0(L), iCa 1.06(L) Wbc- 13.3(H) CBGs- 154, 164, 112   Nutrition-Focused physical exam completed. Findings are no fat depletion, no muscle depletion, and mild edema.   Diet Order:  Diet Heart Room service appropriate? Yes; Fluid consistency: Thin Diet Heart Room service appropriate? Yes; Fluid consistency: Thin  Skin:  Reviewed, no issues  Last BM:  12/31  Height:   Ht Readings from Last 1 Encounters:  03/06/16 5' 8" (1.727 m)    Weight:   Wt Readings from Last 1 Encounters:  03/06/16 282 lb (127.9 kg)    Ideal Body Weight:  70 kg  BMI:  Body mass index is  42.88 kg/m.  Estimated Nutritional Needs:   Kcal:  2100-2300kcal/day   Protein:  128-140g/day   Fluid:  >2L/day   EDUCATION NEEDS:   No education needs identified at this time  Casey Campbell, RD, LDN Pager #- [phone]  

## 2016-03-07 NOTE — Progress Notes (Signed)
PROGRESS NOTE    Ruben Murray  CMK:[id number] DOB: [birth year] DOA: 03/06/2016 PCP: Vena Austria, MD  Brief Narrative:Ruben Murray is a 71 y.o. male with medical history significant for HTN, HLD, remote seizures, and recent URI treated with ZPAck from 12/10-12/17, presented to the ED with shortness of breath, cough, fevers/chills/vomiting. CTA with pneumonia   Assessment & Plan:  Acute Respiratory Failure with Hypoxia -due to CAP/Sepsis -CT angio neg for PE, with findings of B LL and RUL pneumonia with likely benign small bilateral hilar and mediastinal lymph nodes   -continue IV Rocephin and ZIthromax -FU blood Cx -check FLu PCR -mucinex/supportive care  Hypertension  -stable continue amlodipine/losartan  Hyperlipidemia Continue home statins  History of seizures C0ntinue phenobarbital   Right lobe of the thyroid nodule Per CT report, appears benign. -follow with PCP   DVT prophylaxis: Lovenox    Code Status:   Full     Family Communication:  none at bedside Disposition Plan: home in 1-2days   Consultants:     Antimicrobials:rocephin./zithromax   Subjective: Having upper abd pain with coughing, feverish for 3days at home  Objective: Vitals:   03/06/16 1506 03/06/16 2015 03/06/16 2042 03/07/16 0545  BP:  134/71  126/70  Pulse:  (!) 101  96  Resp:  20  (!) 22  Temp:  98.7 F (37.1 C)  98.5 F (36.9 C)  TempSrc:  Oral  Oral  SpO2: 91% 94% 94% 94%  Weight:      Height:        Intake/Output Summary (Last 24 hours) at 03/07/16 1145 Last data filed at 03/07/16 0600  Gross per 24 hour  Intake              650 ml  Output              750 ml  Net             -100 ml   Filed Weights   03/06/16 0441  Weight: 127.9 kg (282 lb)    Examination:  General exam: Appears calm and comfortable, AAOx3 Respiratory system: ronchi at bases Cardiovascular system: S1 & S2 heard, RRR.  Gastrointestinal system: Abdomen is nondistended, soft and  nontender.Normal bowel sounds heard. Central nervous system: Alert and oriented. No focal neurological deficits. Extremities: Symmetric 5 x 5 power. Skin: No rashes, lesions or ulcers Psychiatry: Judgement and insight appear normal. Mood & affect appropriate.     Data Reviewed: I have personally reviewed following labs and imaging studies  CBC:  Recent Labs Lab 03/06/16 0444 03/06/16 0448 03/07/16 0413  WBC 17.1*  --  13.3*  NEUTROABS 14.2*  --   --   HGB 13.2 12.9* 11.3*  HCT 39.0 38.0* 34.6*  MCV 90.7  --  92.0  PLT 226  --  569   Basic Metabolic Panel:  Recent Labs Lab 03/06/16 0444 03/06/16 0448 03/07/16 0413  NA 136 137 133*  K 3.4* 3.4* 2.9*  CL 97* 97* 96*  CO2 24  --  26  GLUCOSE 154* 164* 112*  BUN _0 CREATININE 0.94 0.90 0.89  CALCIUM 8.8*  --  8.0*   GFR: Estimated Creatinine Clearance: 97.8 mL/min (by C-G formula based on SCr of 0.89 mg/dL). Liver Function Tests:  Recent Labs Lab 03/07/16 0413  AST 20  ALT 16*  ALKPHOS 79  BILITOT 0.4  PROT 5.2*  ALBUMIN 3.0*   No results for input(s): LIPASE, AMYLASE in the last 168 hours.  No results for input(s): AMMONIA in the last 168 hours. Coagulation Profile: No results for input(s): INR, PROTIME in the last 168 hours. Cardiac Enzymes: No results for input(s): CKTOTAL, CKMB, CKMBINDEX, TROPONINI in the last 168 hours. BNP (last 3 results) No results for input(s): PROBNP in the last 8760 hours. HbA1C: No results for input(s): HGBA1C in the last 72 hours. CBG: No results for input(s): GLUCAP in the last 168 hours. Lipid Profile: No results for input(s): CHOL, HDL, LDLCALC, TRIG, CHOLHDL, LDLDIRECT in the last 72 hours. Thyroid Function Tests: No results for input(s): TSH, T4TOTAL, FREET4, T3FREE, THYROIDAB in the last 72 hours. Anemia Panel: No results for input(s): VITAMINB12, FOLATE, FERRITIN, TIBC, IRON, RETICCTPCT in the last 72 hours. Urine analysis:    Component Value Date/Time    COLORURINE YELLOW 03/06/2016 0740   APPEARANCEUR CLEAR 03/06/2016 0740   LABSPEC 1.033 (H) 03/06/2016 0740   PHURINE 5.0 03/06/2016 0740   GLUCOSEU NEGATIVE 03/06/2016 0740   HGBUR SMALL (A) 03/06/2016 0740   BILIRUBINUR NEGATIVE 03/06/2016 0740   KETONESUR 5 (A) 03/06/2016 0740   PROTEINUR 30 (A) 03/06/2016 0740   UROBILINOGEN 0.2 05/06/2010 1010   NITRITE NEGATIVE 03/06/2016 0740   LEUKOCYTESUR NEGATIVE 03/06/2016 0740   Sepsis Labs: _0 (procalcitonin:4,lacticidven:4)  ) Recent Results (from the past 240 hour(s))  Blood culture (routine x 2)     Status: None (Preliminary result)   Collection Time: 03/06/16  5:00 AM  Result Value Ref Range Status   Specimen Description BLOOD RIGHT HAND  Final   Special Requests BOTTLES DRAWN AEROBIC AND ANAEROBIC 5CC  Final   Culture PENDING  Incomplete   Report Status PENDING  Incomplete  Urine culture     Status: Abnormal   Collection Time: 03/06/16  7:40 AM  Result Value Ref Range Status   Specimen Description URINE, RANDOM  Final   Special Requests NONE  Final   Culture <10,000 COLONIES/mL INSIGNIFICANT GROWTH (A)  Final   Report Status 03/07/2016 FINAL  Final         Radiology Studies: Dg Chest 2 View  Result Date: 03/07/2016 CLINICAL DATA:  Four days of cough and shortness of breath. Community-acquired pneumonia. History of previous MI, former smoker. EXAM: CHEST  2 VIEW COMPARISON:  Chest x-ray and chest CT scan of March 06, 2016 FINDINGS: The lungs are adequately inflated. There is increased conspicuity of density in the right lower lobe. There is linear density at the left lung base most compatible with atelectasis. There is no pleural effusion or pneumothorax. The heart and pulmonary vascularity are normal. The mediastinum is normal in width. The trachea is midline. The bony thorax is unremarkable. IMPRESSION: Worsening of atelectasis or pneumonia in the right lower lobe. Slight increased prominence of left lower lobe  atelectasis. No CHF or pleural effusion. Electronically Signed   By: David  Martinique M.D.   On: 03/07/2016 07:31   Dg Chest 2 View  Result Date: 03/06/2016 CLINICAL DATA:  Dyspnea, cough and fever for 4 days. EXAM: CHEST  2 VIEW COMPARISON:  02/14/2016 FINDINGS: Stable curvilinear scarring in the left base. The lungs are otherwise clear. There is borderline cardiomegaly, unchanged. The pulmonary vasculature is normal. There is no pleural effusion. Hilar and mediastinal contours are unremarkable and unchanged. IMPRESSION: No acute cardiopulmonary findings.  Stable borderline cardiomegaly. Electronically Signed   By: Andreas Newport M.D.   On: 03/06/2016 05:30   Ct Angio Chest Pe W And/or Wo Contrast  Result Date: 03/06/2016 CLINICAL DATA:  Patient has  been coughing for 5 days, chest pain, upper abdomen pain and shortness of breath. 100 cc of isovue 370 EXAM: CT ANGIOGRAPHY CHEST WITH CONTRAST TECHNIQUE: Multidetector CT imaging of the chest was performed using the standard protocol during bolus administration of intravenous contrast. Multiplanar CT image reconstructions and MIPs were obtained to evaluate the vascular anatomy. CONTRAST:  100 cc Isovue 370 COMPARISON:  Chest x-ray 03/06/2016 FINDINGS: Cardiovascular: Heart size is normal. No imaged pericardial effusion or significant coronary artery calcifications. Pulmonary arteries are only moderately well opacified by contrast bolus. However there is no evidence for acute pulmonary embolus to level of the lobar branches. There is atherosclerotic calcification of the thoracic aorta. No evidence for aneurysm. Mediastinum/Nodes: A 12 mm low-attenuation lesion is identified within the right lobe of the thyroid, benign in appearance. Small mediastinal and hilar lymph nodes are present. Right paratracheal lymph node is 1.0 cm. Subcarinal lymph node is 1.4 cm. Largest right hilar lymph node is 1.5 cm. Largest left hilar lymph node is 1.3 cm. Lungs/Pleura: Study  quality is degraded by patient motion artifact.There are reticulonodular opacities within the lungs, within the right upper lobe in lower lobes bilaterally, right greater than left. Streaky densities are also identified in the lower lobes bilaterally consistent with atelectasis. No pleural effusions or consolidations. Upper Abdomen: The gallbladder is present. Musculoskeletal: No chest wall abnormality. No acute or significant osseous findings. Review of the MIP images confirms the above findings. IMPRESSION: 1. Exam is moderately adequate for evaluation of pulmonary arteries. No large or medium size emboli are identified. Study is not adequate to exclude smaller pulmonary emboli. 2. Reticulonodular opacities in the right upper lobe and both lower lobes, likely indicating infectious process. Bilateral lower lobe atelectasis. 3. Small bilateral hilar and mediastinal lymph nodes may be reactive. 4. Benign-appearing nodule in the right lobe of the thyroid. No further evaluation is necessary based on consensus criteria. Electronically Signed   By: Nolon Nations M.D.   On: 03/06/2016 07:50        Scheduled Meds: . amLODipine  5 mg Oral q morning - 10a  . aspirin EC  81 mg Oral Daily  . azithromycin  500 mg Intravenous Q24H  . cefTRIAXone (ROCEPHIN)  IV  1 g Intravenous Q24H  . enoxaparin (LOVENOX) injection  40 mg Subcutaneous Q24H  . feeding supplement (ENSURE ENLIVE)  237 mL Oral BID BM  . losartan  100 mg Oral Daily   And  . hydrochlorothiazide  12.5 mg Oral Daily  . PHENobarbital  97.2 mg Oral q morning - 10a  . potassium chloride  40 mEq Oral BID  . pravastatin  40 mg Oral Daily  . tamsulosin  0.4 mg Oral Daily   Continuous Infusions:   LOS: 0 days    Time spent: 15mn    PDomenic Polite MD [facility] Pager [phone] If 7PM-7AM, please contact night-coverage [email or web address] Password [email or web address] 03/07/2016, 11:45 AM

## 2016-03-08 DIAGNOSIS — J189 Pneumonia, unspecified organism: Secondary | ICD-10-CM

## 2016-03-08 LAB — BASIC METABOLIC PANEL
Anion gap: 10 (ref 5–15)
BUN: 13 mg/dL (ref 6–20)
CHLORIDE: 96 mmol/L — AB (ref 101–111)
CO2: 31 mmol/L (ref 22–32)
CREATININE: 0.83 mg/dL (ref 0.61–1.24)
Calcium: 8.4 mg/dL — ABNORMAL LOW (ref 8.9–10.3)
GFR calc Af Amer: >60 mL/min (ref >60)
GFR calc non Af Amer: >60 mL/min (ref >60)
GLUCOSE: 121 mg/dL — AB (ref 65–99)
POTASSIUM: 3.7 mmol/L (ref 3.5–5.1)
Sodium: 137 mmol/L (ref 135–145)

## 2016-03-08 LAB — CBC
HEMATOCRIT: 36.6 % — AB (ref 39.0–52.0)
Hemoglobin: 12.1 g/dL — ABNORMAL LOW (ref 13.0–17.0)
MCH: 30.6 pg (ref 26.0–34.0)
MCHC: 33.1 g/dL (ref 30.0–36.0)
MCV: 92.4 fL (ref 78.0–100.0)
PLATELETS: 232 10*3/uL (ref 150–400)
RBC: 3.96 MIL/uL — ABNORMAL LOW (ref 4.22–5.81)
RDW: 13 % (ref 11.5–15.5)
WBC: 13.7 10*3/uL — ABNORMAL HIGH (ref 4.0–10.5)

## 2016-03-08 MED ORDER — POLYETHYLENE GLYCOL 3350 17 G PO PACK
17.0000 g | PACK | Freq: Every day | ORAL | Status: DC
  Administered 2016-03-08 – 2016-03-09 (×2): 17 g via ORAL
  Filled 2016-03-08 (×2): qty 1

## 2016-03-08 MED ORDER — GUAIFENESIN ER 600 MG PO TB12
1200.0000 mg | ORAL_TABLET | Freq: Two times a day (BID) | ORAL | Status: DC
  Administered 2016-03-08 – 2016-03-10 (×4): 1200 mg via ORAL
  Filled 2016-03-08 (×4): qty 2

## 2016-03-08 NOTE — Progress Notes (Signed)
[facility] Progress Note  Patient: Ruben Murray BTY:[id number]   PCP: Vena Austria, MD DOB: [birth year]   DOA: 03/06/2016   DOS: 03/08/2016   Date of Service: the patient was seen and examined on 03/08/2016  Brief hospital course: Pt. with PMH of HTN, HLD, remote seizures, and recent URI treated with ZPAck from 12/10-12/17; admitted on 03/06/2016, with complaint of SOB, was found to have CAP Acute hypoxic respiratory failure. Currently further plan is continue IV antibiotics and monitor for improvement in oxygenation.  Assessment and Plan: 1.Acute Respiratory Failure with Hypoxia -due to CAP/Sepsis -CT angio neg for PE, with findings of B LL and RUL pneumonia with likely benign small bilateral hilar and mediastinal lymph nodes  -continue IV Rocephin and ZIthromax -No growth to date blood Cx -Negative influenza PCR -mucinex/supportive care, flutter device, incentive spirometry Wean oxygen to room air  2. Hypertension  -stable continue amlodipine/losartan  3. Hyperlipidemia Continue home statins  4. History of seizures Continue phenobarbital   5. Right lobe of the thyroid nodule Per CT report, appears benign. -follow with PCP   Bowel regimen: last BM 03/05/2016 Diet: cardiac diet DVT Prophylaxis: subcutaneous Heparin  Advance goals of care discussion: full code  Family Communication: family was present at bedside, at the time of interview. The pt provided permission to discuss medical plan with the family. Opportunity was given to ask question and all questions were answered satisfactorily.   Disposition:  Discharge to home. Expected discharge date: 03/09/2016, improvement in oxygenation  Consultants: noen Procedures: none  Antibiotics: Anti-infectives    Start     Dose/Rate Route Frequency Ordered Stop   03/07/16 0700  cefTRIAXone (ROCEPHIN) 1 g in dextrose 5 % 50 mL IVPB     1 g 100 mL/hr over 30 Minutes Intravenous Every 24 hours 03/06/16 0850  03/14/16 0659   03/06/16 0900  cefTRIAXone (ROCEPHIN) 1 g in dextrose 5 % 50 mL IVPB  Status:  Discontinued     1 g 100 mL/hr over 30 Minutes Intravenous Every 24 hours 03/06/16 0846 03/06/16 0850   03/06/16 0900  azithromycin (ZITHROMAX) 500 mg in dextrose 5 % 250 mL IVPB  Status:  Discontinued     500 mg 250 mL/hr over 60 Minutes Intravenous Every 24 hours 03/06/16 0846 03/06/16 0850   03/06/16 0700  azithromycin (ZITHROMAX) 500 mg in dextrose 5 % 250 mL IVPB     500 mg 250 mL/hr over 60 Minutes Intravenous Every 24 hours 03/06/16 0850 03/13/16 0659   03/06/16 0630  cefTRIAXone (ROCEPHIN) 1 g in dextrose 5 % 50 mL IVPB     1 g 100 mL/hr over 30 Minutes Intravenous  Once 03/06/16 0627 03/06/16 0827   03/06/16 0630  azithromycin (ZITHROMAX) 500 mg in dextrose 5 % 250 mL IVPB     500 mg 250 mL/hr over 60 Minutes Intravenous  Once 03/06/16 0627 03/06/16 0936        Subjective: Feeling better but continues to have dry hacking cough. No chest pain and abdominal pain. Continues to have shortness of breath as well.  Objective: Physical Exam: Vitals:   03/07/16 1301 03/07/16 2158 03/07/16 2333 03/08/16 0510  BP: 133/70 127/63  130/69  Pulse: 91 91  95  Resp: (!) _0 Temp: 98.7 F (37.1 C) 99.3 F (37.4 C)  99.7 F (37.6 C)  TempSrc: Oral Oral  Oral  SpO2: 94% 93% 92% 94%  Weight:      Height:  Intake/Output Summary (Last 24 hours) at 03/08/16 0734 Last data filed at 03/08/16 5732  Gross per 24 hour  Intake              300 ml  Output              500 ml  Net             -200 ml   Filed Weights   03/06/16 0441  Weight: 127.9 kg (282 lb)    General: Alert, Awake and Oriented to Time, Place and Person. Appear in moderate distress, affect appropriate Eyes: PERRL, Conjunctiva normal ENT: Oral Mucosa clear moist. Neck: no JVD, no Abnormal Mass Or lumps Cardiovascular: S1 and S2 Present, no Murmur, Respiratory: Bilateral Air entry equal and Decreased, positive  use of accessory muscle, bilateralcrackles, no wheezes Abdomen: Bowel Sound present, Soft and no tenderness Skin: no redness, no Rash, no induration Extremities: trace Pedal edema, no calf tenderness Neurologic: Grossly no focal neuro deficit. Bilaterally Equal motor strength  Data Reviewed: CBC:  Recent Labs Lab 03/06/16 0444 03/06/16 0448 03/07/16 0413 03/08/16 0526  WBC 17.1*  --  13.3* 13.7*  NEUTROABS 14.2*  --   --   --   HGB 13.2 12.9* 11.3* 12.1*  HCT 39.0 38.0* 34.6* 36.6*  MCV 90.7  --  92.0 92.4  PLT 226  --  201 202   Basic Metabolic Panel:  Recent Labs Lab 03/06/16 0444 03/06/16 0448 03/07/16 0413 03/08/16 0526  NA 136 137 133* 137  K 3.4* 3.4* 2.9* 3.7  CL 97* 97* 96* 96*  CO2 24  --  26 31  GLUCOSE 154* 164* 112* 121*  BUN _0 CREATININE 0.94 0.90 0.89 0.83  CALCIUM 8.8*  --  8.0* 8.4*    Liver Function Tests:  Recent Labs Lab 03/07/16 0413  AST 20  ALT 16*  ALKPHOS 79  BILITOT 0.4  PROT 5.2*  ALBUMIN 3.0*   No results for input(s): LIPASE, AMYLASE in the last 168 hours. No results for input(s): AMMONIA in the last 168 hours. Coagulation Profile: No results for input(s): INR, PROTIME in the last 168 hours. Cardiac Enzymes: No results for input(s): CKTOTAL, CKMB, CKMBINDEX, TROPONINI in the last 168 hours. BNP (last 3 results) No results for input(s): PROBNP in the last 8760 hours.  CBG: No results for input(s): GLUCAP in the last 168 hours.  Studies: No results found.   Scheduled Meds: . amLODipine  5 mg Oral q morning - 10a  . aspirin EC  81 mg Oral Daily  . azithromycin  500 mg Intravenous Q24H  . cefTRIAXone (ROCEPHIN)  IV  1 g Intravenous Q24H  . enoxaparin (LOVENOX) injection  40 mg Subcutaneous Q24H  . feeding supplement (ENSURE ENLIVE)  237 mL Oral BID BM  . losartan  100 mg Oral Daily   And  . hydrochlorothiazide  12.5 mg Oral Daily  . PHENobarbital  97.2 mg Oral q morning - 10a  . pravastatin  40 mg Oral  Daily  . tamsulosin  0.4 mg Oral Daily   Continuous Infusions: PRN Meds: albuterol, benzonatate, guaiFENesin, HYDROcodone-homatropine, ipratropium-albuterol, morphine injection  Time spent: 30 minutes  Author: Berle Mull, MD [facility] Pager: [phone] 03/08/2016 7:34 AM  If 7PM-7AM, please contact night-coverage at [email or web address], password [facility]

## 2016-03-09 LAB — CULTURE, RESPIRATORY W GRAM STAIN: Culture: NORMAL

## 2016-03-09 LAB — CULTURE, RESPIRATORY

## 2016-03-09 MED ORDER — AZITHROMYCIN 250 MG PO TABS
500.0000 mg | ORAL_TABLET | Freq: Every day | ORAL | Status: DC
  Administered 2016-03-10 (×2): 500 mg via ORAL
  Filled 2016-03-09: qty 2

## 2016-03-09 MED ORDER — PREDNISONE 50 MG PO TABS
50.0000 mg | ORAL_TABLET | Freq: Every day | ORAL | Status: DC
  Administered 2016-03-09 – 2016-03-10 (×2): 50 mg via ORAL
  Filled 2016-03-09 (×2): qty 1

## 2016-03-09 NOTE — Progress Notes (Signed)
[facility] Progress Note  Patient: Ruben Murray YYQ:[id number]   PCP: Vena Austria, MD DOB: 1945/12/06   DOA: 03/06/2016   DOS: 03/09/2016   Date of Service: the patient was seen and examined on 03/09/2016  Brief hospital course: Pt. with PMH of HTN, HLD, remote seizures, and recent URI treated with ZPAck from 12/10-12/17; admitted on 03/06/2016, with complaint of SOB, was found to have CAP Acute hypoxic respiratory failure. Currently further plan is continue IV antibiotics and monitor for improvement in oxygenation.  Assessment and Plan: 1.Acute Respiratory Failure with Hypoxia -due to CAP/Sepsis -CT angio neg for PE, with findings of B LL and RUL pneumonia with likely benign small bilateral hilar and mediastinal lymph nodes  -continue IV Rocephin and ZIthromax -No growth to date blood Cx -Negative influenza PCR -mucinex/supportive care, flutter device, incentive spirometry Wean oxygen to room air, Although oxygenation dropping to 88% at room air at rest on my evaluation. We will add prednisone.  2. Hypertension  -stable continue amlodipine/losartan  3. Hyperlipidemia Continue home statins  4. History of seizures Continue phenobarbital   5. Right lobe of the thyroid nodule Per CT report, appears benign. -follow with PCP   Bowel regimen: last BM 03/05/2016 Diet: cardiac diet DVT Prophylaxis: subcutaneous Heparin  Advance goals of care discussion: full code  Family Communication: no family was present at bedside, at the time of interview.   Disposition:  Discharge to home. Expected discharge date: 03/10/2016, improvement in oxygenation  Consultants: none Procedures: none  Antibiotics: Anti-infectives    Start     Dose/Rate Route Frequency Ordered Stop   03/10/16 1000  azithromycin (ZITHROMAX) tablet 500 mg     500 mg Oral Daily 03/09/16 1238 03/14/16 0959   03/07/16 0700  cefTRIAXone (ROCEPHIN) 1 g in dextrose 5 % 50 mL IVPB     1 g 100 mL/hr  over 30 Minutes Intravenous Every 24 hours 03/06/16 0850 03/14/16 0659   03/06/16 0900  cefTRIAXone (ROCEPHIN) 1 g in dextrose 5 % 50 mL IVPB  Status:  Discontinued     1 g 100 mL/hr over 30 Minutes Intravenous Every 24 hours 03/06/16 0846 03/06/16 0850   03/06/16 0900  azithromycin (ZITHROMAX) 500 mg in dextrose 5 % 250 mL IVPB  Status:  Discontinued     500 mg 250 mL/hr over 60 Minutes Intravenous Every 24 hours 03/06/16 0846 03/06/16 0850   03/06/16 0700  azithromycin (ZITHROMAX) 500 mg in dextrose 5 % 250 mL IVPB  Status:  Discontinued     500 mg 250 mL/hr over 60 Minutes Intravenous Every 24 hours 03/06/16 0850 03/09/16 1238   03/06/16 0630  cefTRIAXone (ROCEPHIN) 1 g in dextrose 5 % 50 mL IVPB     1 g 100 mL/hr over 30 Minutes Intravenous  Once 03/06/16 0627 03/06/16 0827   03/06/16 0630  azithromycin (ZITHROMAX) 500 mg in dextrose 5 % 250 mL IVPB     500 mg 250 mL/hr over 60 Minutes Intravenous  Once 03/06/16 0627 03/06/16 0936      Subjective: Continues to have the cough. No nausea no vomiting. Feeling better  Objective: Physical Exam: Vitals:   03/08/16 2107 03/09/16 0551 03/09/16 1204 03/09/16 1425  BP: (!) 141/72 (!) 147/67  125/78  Pulse: 100 95  90  Resp: _0 Temp: 99.5 F (37.5 C) 99.4 F (37.4 C)  99 F (37.2 C)  TempSrc: Oral Oral  Oral  SpO2: 93% 94% 95% 93%  Weight:  Height:        Intake/Output Summary (Last 24 hours) at 03/09/16 2031 Last data filed at 03/09/16 1856  Gross per 24 hour  Intake             1020 ml  Output              825 ml  Net              195 ml   Filed Weights   03/06/16 0441  Weight: 127.9 kg (282 lb)    General: Alert, Awake and Oriented to Time, Place and Person. Appear in moderate distress, affect appropriate Eyes: PERRL, Conjunctiva normal ENT: Oral Mucosa clear moist. Neck: no JVD, no Abnormal Mass Or lumps Cardiovascular: S1 and S2 Present, no Murmur, Respiratory: Bilateral Air entry equal and Decreased,  positive use of accessory muscle, bilateralcrackles, no wheezes Abdomen: Bowel Sound present, Soft and no tenderness Skin: no redness, no Rash, no induration Extremities: trace Pedal edema, no calf tenderness Neurologic: Grossly no focal neuro deficit. Bilaterally Equal motor strength  Data Reviewed: CBC:  Recent Labs Lab 03/06/16 0444 03/06/16 0448 03/07/16 0413 03/08/16 0526  WBC 17.1*  --  13.3* 13.7*  NEUTROABS 14.2*  --   --   --   HGB 13.2 12.9* 11.3* 12.1*  HCT 39.0 38.0* 34.6* 36.6*  MCV 90.7  --  92.0 92.4  PLT 226  --  201 191   Basic Metabolic Panel:  Recent Labs Lab 03/06/16 0444 03/06/16 0448 03/07/16 0413 03/08/16 0526  NA 136 137 133* 137  K 3.4* 3.4* 2.9* 3.7  CL 97* 97* 96* 96*  CO2 24  --  26 31  GLUCOSE 154* 164* 112* 121*  BUN _0 CREATININE 0.94 0.90 0.89 0.83  CALCIUM 8.8*  --  8.0* 8.4*    Liver Function Tests:  Recent Labs Lab 03/07/16 0413  AST 20  ALT 16*  ALKPHOS 79  BILITOT 0.4  PROT 5.2*  ALBUMIN 3.0*    Studies: No results found.   Scheduled Meds: . amLODipine  5 mg Oral q morning - 10a  . aspirin EC  81 mg Oral Daily  . [START ON 03/10/2016] azithromycin  500 mg Oral Daily  . cefTRIAXone (ROCEPHIN)  IV  1 g Intravenous Q24H  . enoxaparin (LOVENOX) injection  40 mg Subcutaneous Q24H  . feeding supplement (ENSURE ENLIVE)  237 mL Oral BID BM  . guaiFENesin  1,200 mg Oral BID  . losartan  100 mg Oral Daily   And  . hydrochlorothiazide  12.5 mg Oral Daily  . PHENobarbital  97.2 mg Oral q morning - 10a  . polyethylene glycol  17 g Oral Daily  . pravastatin  40 mg Oral Daily  . predniSONE  50 mg Oral Q breakfast  . tamsulosin  0.4 mg Oral Daily   Continuous Infusions: PRN Meds: albuterol, benzonatate, HYDROcodone-homatropine, ipratropium-albuterol, morphine injection  Time spent: 30 minutes  Author: Berle Mull, MD [facility] Pager: [phone] 03/09/2016 8:31 PM  If 7PM-7AM, please contact  night-coverage at [email or web address], password [facility]

## 2016-03-09 NOTE — Progress Notes (Signed)
RN called RT for PRN HHN tx. RT unavailable at this time, and no other RT's available currently. RT attempted to call RN to ask her to give tx but unable to reach her. Will try back later. RT will continue to monitor.

## 2016-03-10 LAB — COMPREHENSIVE METABOLIC PANEL
ALT: 40 U/L (ref 17–63)
ANION GAP: 8 (ref 5–15)
AST: 33 U/L (ref 15–41)
Albumin: 3 g/dL — ABNORMAL LOW (ref 3.5–5.0)
Alkaline Phosphatase: 78 U/L (ref 38–126)
BUN: 15 mg/dL (ref 6–20)
CO2: 30 mmol/L (ref 22–32)
CREATININE: 0.71 mg/dL (ref 0.61–1.24)
Calcium: 8.6 mg/dL — ABNORMAL LOW (ref 8.9–10.3)
Chloride: 100 mmol/L — ABNORMAL LOW (ref 101–111)
Glucose, Bld: 110 mg/dL — ABNORMAL HIGH (ref 65–99)
POTASSIUM: 3.7 mmol/L (ref 3.5–5.1)
SODIUM: 138 mmol/L (ref 135–145)
Total Bilirubin: 0.4 mg/dL (ref 0.3–1.2)
Total Protein: 6.7 g/dL (ref 6.5–8.1)

## 2016-03-10 LAB — CBC WITH DIFFERENTIAL/PLATELET
Basophils Absolute: 0 10*3/uL (ref 0.0–0.1)
Basophils Relative: 0 %
EOS ABS: 0.1 10*3/uL (ref 0.0–0.7)
Eosinophils Relative: 0 %
HEMATOCRIT: 35.9 % — AB (ref 39.0–52.0)
HEMOGLOBIN: 11.9 g/dL — AB (ref 13.0–17.0)
Lymphocytes Relative: 18 %
Lymphs Abs: 2.8 10*3/uL (ref 0.7–4.0)
MCH: 30.4 pg (ref 26.0–34.0)
MCHC: 33.1 g/dL (ref 30.0–36.0)
MCV: 91.6 fL (ref 78.0–100.0)
Monocytes Absolute: 1.1 10*3/uL — ABNORMAL HIGH (ref 0.1–1.0)
Monocytes Relative: 7 %
NEUTROS ABS: 11.9 10*3/uL — AB (ref 1.7–7.7)
NEUTROS PCT: 75 %
Platelets: 282 10*3/uL (ref 150–400)
RBC: 3.92 MIL/uL — AB (ref 4.22–5.81)
RDW: 13 % (ref 11.5–15.5)
WBC: 15.8 10*3/uL — AB (ref 4.0–10.5)

## 2016-03-10 LAB — MAGNESIUM: MAGNESIUM: 2.5 mg/dL — AB (ref 1.7–2.4)

## 2016-03-10 MED ORDER — PREDNISONE 10 MG PO TABS: ORAL_TABLET | ORAL | 0 refills | Status: DC

## 2016-03-10 MED ORDER — GUAIFENESIN ER 600 MG PO TB12: 1200.0000 mg | ORAL_TABLET | Freq: Two times a day (BID) | ORAL | 0 refills | Status: DC

## 2016-03-10 MED ORDER — BENZONATATE 100 MG PO CAPS: 100.0000 mg | ORAL_CAPSULE | Freq: Three times a day (TID) | ORAL | 0 refills | Status: DC | PRN

## 2016-03-10 MED ORDER — IPRATROPIUM BROMIDE 0.03 % NA SOLN: 2.0000 | Freq: Three times a day (TID) | NASAL | 0 refills | Status: DC

## 2016-03-10 MED ORDER — ALBUTEROL SULFATE HFA 108 (90 BASE) MCG/ACT IN AERS: 2.0000 | INHALATION_SPRAY | RESPIRATORY_TRACT | 0 refills | Status: DC | PRN

## 2016-03-10 MED ORDER — POLYETHYLENE GLYCOL 3350 17 G PO PACK: 17.0000 g | PACK | Freq: Every day | ORAL | 0 refills | Status: DC | PRN

## 2016-03-10 MED ORDER — HYDROCODONE-HOMATROPINE 5-1.5 MG/5ML PO SYRP: 5.0000 mL | ORAL_SOLUTION | ORAL | 0 refills | Status: DC | PRN

## 2016-03-10 MED ORDER — AZITHROMYCIN 500 MG PO TABS: 500.0000 mg | ORAL_TABLET | Freq: Every day | ORAL | 0 refills | Status: AC

## 2016-03-10 MED ORDER — CEPHALEXIN 500 MG PO CAPS: 500.0000 mg | ORAL_CAPSULE | Freq: Three times a day (TID) | ORAL | 0 refills | Status: AC

## 2016-03-10 MED ORDER — ENSURE ENLIVE PO LIQD: 237.0000 mL | Freq: Two times a day (BID) | ORAL | 12 refills | Status: DC

## 2016-03-10 NOTE — Progress Notes (Signed)
Gerarda Fraction to be D/C'd  per MD order. Discussed with the patient and all questions fully answered.  VSS, Skin clean, dry and intact without evidence of skin break down, no evidence of skin tears noted.  IV catheter discontinued intact. Site without signs and symptoms of complications. Dressing and pressure applied.  An After Visit Summary was printed and given to the patient. Patient received prescription.  D/c education completed with patient/family including follow up instructions, medication list, d/c activities limitations if indicated, with other d/c instructions as indicated by MD - patient able to verbalize understanding, all questions fully answered.   Patient instructed to return to ED, call 911, or call MD for any changes in condition.   Patient to be escorted via [place], and D/C home via private auto.

## 2016-03-10 NOTE — Care Management Important Message (Signed)
Important Message  Patient Details  Name: Ruben Murray MRN: [id number] Date of Birth: [birth year]   Medicare Important Message Given:       Ruben Murray 03/10/2016, 1:52 PM

## 2016-03-10 NOTE — Progress Notes (Signed)
Ambulatory Pulse Oximetry:  Resting HR_95_   O2 Sat_92_  Walk HR_110_   O2 Sat_92_ Walk HR__   O2 Sat__ Walk HR__   O2 Sat__  _yes_Test completed without difficulty __Test stopped due to:

## 2016-03-11 LAB — CULTURE, BLOOD (ROUTINE X 2)
Culture: NO GROWTH
Culture: NO GROWTH

## 2016-03-14 NOTE — Discharge Summary (Signed)
[facility] Discharge Summary   Patient: Ruben Murray PZW:[id number]   PCP: Vena Austria, MD DOB: [birth year]   Date of admission: 03/06/2016   Date of discharge: 03/10/2016     Discharge Diagnoses:  Active Problems:   Recurrent erosion of cornea   Community acquired pneumonia   Hypertension   Hyperlipidemia   Right thyroid nodule   Seizures ([place])   CAP (community acquired pneumonia)   Admitted From: HOME Disposition:  HOME  Recommendations for Outpatient Follow-up:  1. Please follow-up with PCP in one week  Follow-up Information    READE,ROBERT ALEXANDER, MD. Schedule an appointment as soon as possible for a visit in 1 week(s).   Specialty:  Family Medicine Contact information: [place] [address] [phone]          Diet recommendation: Cardiac diet  Activity: The patient is advised to gradually reintroduce usual activities.  Discharge Condition: good  Code Status: Full code  History of present illness: As per the H and P dictated on admission, "Ruben Murray is a 71 y.o. male with medical history significant for HTN, HLD, remote seizures, and recent URI treated with ZPAck from 12/10-12/17, presenting to the ED with shortness of breath accompanied  And cough is worse over the last 4 days. Associated symptoms include fever, chest pain when coughing, and nausea with vomiting due to new course buildup. He denies hemoptysis. He reported subjective fever at home, without chills. No myalgias.   His wife is sick as well with similar symptoms.  He denies any abdominal pain. His appetite is normal. He denies any dizziness or vertigo. He denies any lower extremity swelling  No confusion was reported. Denies tobacco use. Denies history of COPD or PE  "  Hospital Course:   Summary of his active problems in the hospital is as following. 1.Acute Respiratory Failure with Hypoxia -due to CAP/Sepsis -CT angio neg for PE, with  findings ofB LL and RUL pneumonia with likely benign small bilateral hilar and mediastinal lymph nodes  -treated with IV Rocephin and ZIthromax, discharge on oral meds -No growth to date blood Cx -Negative influenza PCR -mucinex/supportive care, flutter device, incentive spirometry predinsone taper  2. Hypertension  -stable continue amlodipine/losartan  3. Hyperlipidemia Continue home statins  4. History of seizures Continue phenobarbital   5. Right lobe of the thyroid nodule Per CT report, appears benign. -follow with PCP   All other chronic medical condition were stable during the hospitalization.  Patient was ambulatory without any assistance. On the day of the discharge the patient's vitals were stable, and no other acute medical condition were reported by patient. the patient was felt safe to be discharge at home with family.  Procedures and Results:  none   Consultations:  none  DISCHARGE MEDICATION: Discharge Medication List as of 03/10/2016  2:36 PM    START taking these medications   Details  azithromycin (ZITHROMAX) 500 MG tablet Take 1 tablet (500 mg total) by mouth daily., Starting Fri 03/10/2016, Until Mon 03/13/2016, Normal    cephALEXin (KEFLEX) 500 MG capsule Take 1 capsule (500 mg total) by mouth 3 (three) times daily., Starting Fri 03/10/2016, Until Mon 03/13/2016, Normal    feeding supplement, ENSURE ENLIVE, (ENSURE ENLIVE) LIQD Take 237 mLs by mouth 2 (two) times daily between meals., Starting Fri 03/10/2016, Normal    guaiFENesin (MUCINEX) 600 MG 12 hr tablet Take 2 tablets (1,200 mg total) by mouth 2 (two) times daily., Starting Fri 03/10/2016,  Normal    HYDROcodone-homatropine (HYCODAN) 5-1.5 MG/5ML syrup Take 5 mLs by mouth every 4 (four) hours as needed (moderate pain)., Starting Fri 03/10/2016, Print    polyethylene glycol (MIRALAX / GLYCOLAX) packet Take 17 g by mouth daily as needed., Starting Fri 03/10/2016, Normal    predniSONE (DELTASONE) 10 MG  tablet Take 22m daily for 3days,Take 371mdaily for 3days,Take 2043maily for 3days,Take 64m25mily for 3days, then stop, Normal      CONTINUE these medications which have CHANGED   Details  albuterol (PROVENTIL HFA;VENTOLIN HFA) 108 (90 Base) MCG/ACT inhaler Inhale 2 puffs into the lungs every 2 (two) hours as needed for wheezing or shortness of breath (cough)., Starting Fri 03/10/2016, Print    benzonatate (TESSALON) 100 MG capsule Take 1 capsule (100 mg total) by mouth 3 (three) times daily as needed for cough., Starting Fri 03/10/2016, Normal    ipratropium (ATROVENT) 0.03 % nasal spray Place 2 sprays into both nostrils 3 (three) times daily., Starting Fri 03/10/2016, Normal      CONTINUE these medications which have NOT CHANGED   Details  acetaminophen (TYLENOL) 500 MG tablet Take 1,000 mg by mouth every 6 (six) hours as needed (Pain)., Until Discontinued, Historical Med    amLODipine (NORVASC) 5 MG tablet Take 5 mg by mouth every morning., Until Discontinued, Historical Med    aspirin EC 81 MG tablet Take 81 mg by mouth daily., Until Discontinued, Historical Med    losartan-hydrochlorothiazide (HYZAAR) 100-12.5 MG per tablet Take 1 tablet by mouth every morning., Until Discontinued, Historical Med    Multiple Vitamin (MULITIVITAMIN WITH MINERALS) TABS Take 1 tablet by mouth every morning., Until Discontinued, Historical Med    PHENobarbital (LUMINAL) 97.2 MG tablet Take 97.2 mg by mouth every morning., Until Discontinued, Historical Med    pravastatin (PRAVACHOL) 40 MG tablet Take 40 mg by mouth daily., Until Discontinued, Historical Med    tamsulosin (FLOMAX) 0.4 MG CAPS capsule Take 1 capsule (0.4 mg total) by mouth daily., Starting Mon 03/15/2015, Normal      STOP taking these medications     amoxicillin-clavulanate (AUGMENTIN) 500-125 MG tablet      lactobacillus acidophilus & bulgar (LACTINEX) chewable tablet      loperamide (IMODIUM) 2 MG capsule        Allergies    Allergen Reactions  . Dilaudid [Hydromorphone Hcl] Other (See Comments)    Reaction:  Makes pt hyper   . Morphine And Related Other (See Comments)    Pt states that this medication makes him feel like he is out of it.    . Tegretol [Carbamazepine] Hives   Discharge Instructions    Diet - low sodium heart healthy    Complete by:  As directed    Discharge instructions    Complete by:  As directed    It is important that you read following instructions as well as go over your medication list with RN to help you understand your care after this hospitalization.  Discharge Instructions: Please follow-up with PCP in one week  Please request your primary care physician to go over all Hospital Tests and Procedure/Radiological results at the follow up,  Please get all Hospital records sent to your PCP by signing hospital release before you go home.   Do not drive, operating heavy machinery, perform activities at heights, swimming or participation in water activities or provide baby sitting services; until you have been seen by Primary Care Physician or a Neurologist and advised to do  so again. Do not take more than prescribed Pain, Sleep and Anxiety Medications. You were cared for by a hospitalist during your hospital stay. If you have any questions about your discharge medications or the care you received while you were in the hospital after you are discharged, you can call the unit and ask to speak with the hospitalist on call if the hospitalist that took care of you is not available.  Once you are discharged, your primary care physician will handle any further medical issues. Please note that NO REFILLS for any discharge medications will be authorized once you are discharged, as it is imperative that you return to your primary care physician (or establish a relationship with a primary care physician if you do not have one) for your aftercare needs so that they can reassess your need for  medications and monitor your lab values. You Must read complete instructions/literature along with all the possible adverse reactions/side effects for all the Medicines you take and that have been prescribed to you. Take any new Medicines after you have completely understood and accept all the possible adverse reactions/side effects. Wear Seat belts while driving. If you have smoked or chewed Tobacco in the last 2 yrs please stop smoking and/or stop any Recreational drug use.   Increase activity slowly    Complete by:  As directed      Discharge Exam: Filed Weights   03/06/16 0441  Weight: 127.9 kg (282 lb)   Vitals:   03/10/16 0819 03/10/16 1111  BP: 130/76   Pulse:  (!) 111  Resp:  18  Temp:     General: Appear in mild distress, no Rash; Oral Mucosa moist. Cardiovascular: S1 and S2 Present, no Murmur, no JVD Respiratory: Bilateral Air entry present and basal Crackles, no wheezes Abdomen: Bowel Sound present, Soft and no tenderness Extremities: trace Pedal edema, no calf tenderness Neurology: Grossly no focal neuro deficit.  The results of significant diagnostics from this hospitalization (including imaging, microbiology, ancillary and laboratory) are listed below for reference.    Significant Diagnostic Studies: Dg Chest 2 View  Result Date: 03/07/2016 CLINICAL DATA:  Four days of cough and shortness of breath. Community-acquired pneumonia. History of previous MI, former smoker. EXAM: CHEST  2 VIEW COMPARISON:  Chest x-ray and chest CT scan of March 06, 2016 FINDINGS: The lungs are adequately inflated. There is increased conspicuity of density in the right lower lobe. There is linear density at the left lung base most compatible with atelectasis. There is no pleural effusion or pneumothorax. The heart and pulmonary vascularity are normal. The mediastinum is normal in width. The trachea is midline. The bony thorax is unremarkable. IMPRESSION: Worsening of atelectasis or pneumonia in  the right lower lobe. Slight increased prominence of left lower lobe atelectasis. No CHF or pleural effusion. Electronically Signed   By: David  Martinique M.D.   On: 03/07/2016 07:31   Dg Chest 2 View  Result Date: 03/06/2016 CLINICAL DATA:  Dyspnea, cough and fever for 4 days. EXAM: CHEST  2 VIEW COMPARISON:  02/14/2016 FINDINGS: Stable curvilinear scarring in the left base. The lungs are otherwise clear. There is borderline cardiomegaly, unchanged. The pulmonary vasculature is normal. There is no pleural effusion. Hilar and mediastinal contours are unremarkable and unchanged. IMPRESSION: No acute cardiopulmonary findings.  Stable borderline cardiomegaly. Electronically Signed   By: Andreas Newport M.D.   On: 03/06/2016 05:30   Dg Chest 2 View  Result Date: 02/14/2016 CLINICAL DATA:  71 year old male with  cough and chest pain. EXAM: CHEST  2 VIEW COMPARISON:  Chest radiograph dated 01/07/2015 FINDINGS: Two views of the chest do not demonstrate a focal consolidation. There is no pleural effusion or pneumothorax. Minimal platelike atelectasis noted at the left lung base. The cardiac silhouette is within normal limits. No acute osseous pathology identified. IMPRESSION: No active cardiopulmonary disease. Electronically Signed   By: Anner Crete M.D.   On: 02/14/2016 23:17   Ct Angio Chest Pe W And/or Wo Contrast  Result Date: 03/06/2016 CLINICAL DATA:  Patient has been coughing for 5 days, chest pain, upper abdomen pain and shortness of breath. 100 cc of isovue 370 EXAM: CT ANGIOGRAPHY CHEST WITH CONTRAST TECHNIQUE: Multidetector CT imaging of the chest was performed using the standard protocol during bolus administration of intravenous contrast. Multiplanar CT image reconstructions and MIPs were obtained to evaluate the vascular anatomy. CONTRAST:  100 cc Isovue 370 COMPARISON:  Chest x-ray 03/06/2016 FINDINGS: Cardiovascular: Heart size is normal. No imaged pericardial effusion or significant coronary  artery calcifications. Pulmonary arteries are only moderately well opacified by contrast bolus. However there is no evidence for acute pulmonary embolus to level of the lobar branches. There is atherosclerotic calcification of the thoracic aorta. No evidence for aneurysm. Mediastinum/Nodes: A 12 mm low-attenuation lesion is identified within the right lobe of the thyroid, benign in appearance. Small mediastinal and hilar lymph nodes are present. Right paratracheal lymph node is 1.0 cm. Subcarinal lymph node is 1.4 cm. Largest right hilar lymph node is 1.5 cm. Largest left hilar lymph node is 1.3 cm. Lungs/Pleura: Study quality is degraded by patient motion artifact.There are reticulonodular opacities within the lungs, within the right upper lobe in lower lobes bilaterally, right greater than left. Streaky densities are also identified in the lower lobes bilaterally consistent with atelectasis. No pleural effusions or consolidations. Upper Abdomen: The gallbladder is present. Musculoskeletal: No chest wall abnormality. No acute or significant osseous findings. Review of the MIP images confirms the above findings. IMPRESSION: 1. Exam is moderately adequate for evaluation of pulmonary arteries. No large or medium size emboli are identified. Study is not adequate to exclude smaller pulmonary emboli. 2. Reticulonodular opacities in the right upper lobe and both lower lobes, likely indicating infectious process. Bilateral lower lobe atelectasis. 3. Small bilateral hilar and mediastinal lymph nodes may be reactive. 4. Benign-appearing nodule in the right lobe of the thyroid. No further evaluation is necessary based on consensus criteria. Electronically Signed   By: Nolon Nations M.D.   On: 03/06/2016 07:50   Ct Abdomen Pelvis W Contrast  Result Date: 02/14/2016 CLINICAL DATA:  Lower abdominal pain and constipation. EXAM: CT ABDOMEN AND PELVIS WITH CONTRAST TECHNIQUE: Multidetector CT imaging of the abdomen and  pelvis was performed using the standard protocol following bolus administration of intravenous contrast. CONTRAST:  165m ISOVUE-300 IOPAMIDOL (ISOVUE-300) INJECTION 61% COMPARISON:  None FINDINGS: Lower chest: No pulmonary nodules. No visible pleural or pericardial effusion. Hepatobiliary: Normal hepatic size and contours without focal liver lesion. No perihepatic ascites. No intra- or extrahepatic biliary dilatation. Normal gallbladder. Pancreas: Normal pancreatic contours and enhancement. No peripancreatic fluid collection or pancreatic ductal dilatation. Spleen: Normal. Adrenals/Urinary Tract: Normal adrenal glands. No hydronephrosis or solid renal mass. Stomach/Bowel: No abnormal bowel dilatation. No bowel wall thickening or adjacent fat stranding to indicate acute inflammation. No abdominal fluid collection. Normal appendix. Normal amount of colonic stool. Vascular/Lymphatic: There is atherosclerotic calcification of the non aneurysmal abdominal aorta. No abdominal or pelvic adenopathy. Reproductive: Incompletely visualized suspected  small right hydrocele. Mildly enlarged prostate. Normal seminal vesicles. Musculoskeletal: There is no bony spinal canal stenosis. No lytic or blastic lesions. Normal visualized extrathoracic and extraperitoneal soft tissues. Other: No contributory non-categorized findings. IMPRESSION: 1. No acute abnormality of the abdomen or pelvis. 2. No colonic dilatation or abnormal amount of stool. Electronically Signed   By: Ulyses Jarred M.D.   On: 02/14/2016 23:13    Microbiology: Recent Results (from the past 240 hour(s))  Blood culture (routine x 2)     Status: None   Collection Time: 03/06/16  5:00 AM  Result Value Ref Range Status   Specimen Description BLOOD RIGHT HAND  Final   Special Requests BOTTLES DRAWN AEROBIC AND ANAEROBIC 5CC  Final   Culture NO GROWTH 5 DAYS  Final   Report Status 03/11/2016 FINAL  Final  Blood culture (routine x 2)     Status: None   Collection  Time: 03/06/16  5:33 AM  Result Value Ref Range Status   Specimen Description BLOOD LEFT ANTECUBITAL  Final   Special Requests BOTTLES DRAWN AEROBIC AND ANAEROBIC 5CC  Final   Culture NO GROWTH 5 DAYS  Final   Report Status 03/11/2016 FINAL  Final  Urine culture     Status: Abnormal   Collection Time: 03/06/16  7:40 AM  Result Value Ref Range Status   Specimen Description URINE, RANDOM  Final   Special Requests NONE  Final   Culture <10,000 COLONIES/mL INSIGNIFICANT GROWTH (A)  Final   Report Status 03/07/2016 FINAL  Final  Culture, sputum-assessment     Status: None   Collection Time: 03/06/16  8:41 AM  Result Value Ref Range Status   Specimen Description EXPECTORATED SPUTUM  Final   Special Requests NONE  Final   Sputum evaluation THIS SPECIMEN IS ACCEPTABLE FOR SPUTUM CULTURE  Final   Report Status 03/07/2016 FINAL  Final  Culture, respiratory (NON-Expectorated)     Status: None   Collection Time: 03/06/16  8:41 AM  Result Value Ref Range Status   Specimen Description EXPECTORATED SPUTUM  Final   Special Requests NONE Reflexed from [id number]  Final   Gram Stain   Final    MODERATE WBC PRESENT, PREDOMINANTLY PMN MODERATE GRAM POSITIVE COCCI MODERATE GRAM NEGATIVE DIPLOCOCCI FEW GRAM NEGATIVE COCCOBACILLI    Culture Consistent with normal respiratory flora.  Final   Report Status 03/09/2016 FINAL  Final     Labs: CBC:  Recent Labs Lab 03/08/16 0526 03/10/16 0559  WBC 13.7* 15.8*  NEUTROABS  --  11.9*  HGB 12.1* 11.9*  HCT 36.6* 35.9*  MCV 92.4 91.6  PLT 232 295   Basic Metabolic Panel:  Recent Labs Lab 03/08/16 0526 03/10/16 0559  NA 137 138  K 3.7 3.7  CL 96* 100*  CO2 31 30  GLUCOSE 121* 110*  BUN 13 15  CREATININE 0.83 0.71  CALCIUM 8.4* 8.6*  MG  --  2.5*   Liver Function Tests:  Recent Labs Lab 03/10/16 0559  AST 33  ALT 40  ALKPHOS 78  BILITOT 0.4  PROT 6.7  ALBUMIN 3.0*   BNP (last 3 results)  Recent Labs  03/06/16 0445  BNP  129.7*   Time spent: 30 minutes  Signed:  Melek Pownall  [facility] 03/10/2016 , 6:44 PM

## 2016-03-15 DIAGNOSIS — M545 Low back pain: Secondary | ICD-10-CM | POA: Diagnosis not present

## 2016-03-15 DIAGNOSIS — E7800 Pure hypercholesterolemia, unspecified: Secondary | ICD-10-CM | POA: Diagnosis not present

## 2016-03-15 DIAGNOSIS — G4733 Obstructive sleep apnea (adult) (pediatric): Secondary | ICD-10-CM | POA: Diagnosis not present

## 2016-03-15 DIAGNOSIS — L309 Dermatitis, unspecified: Secondary | ICD-10-CM | POA: Diagnosis not present

## 2016-03-15 DIAGNOSIS — R569 Unspecified convulsions: Secondary | ICD-10-CM | POA: Diagnosis not present

## 2016-03-15 DIAGNOSIS — I10 Essential (primary) hypertension: Secondary | ICD-10-CM | POA: Diagnosis not present

## 2016-03-15 DIAGNOSIS — J189 Pneumonia, unspecified organism: Secondary | ICD-10-CM | POA: Diagnosis not present

## 2016-03-15 DIAGNOSIS — N400 Enlarged prostate without lower urinary tract symptoms: Secondary | ICD-10-CM | POA: Diagnosis not present

## 2016-03-15 DIAGNOSIS — R05 Cough: Secondary | ICD-10-CM | POA: Diagnosis not present

## 2016-03-29 DIAGNOSIS — J189 Pneumonia, unspecified organism: Secondary | ICD-10-CM | POA: Diagnosis not present

## 2016-03-29 DIAGNOSIS — D72829 Elevated white blood cell count, unspecified: Secondary | ICD-10-CM | POA: Diagnosis not present

## 2016-04-19 DIAGNOSIS — R358 Other polyuria: Secondary | ICD-10-CM | POA: Diagnosis not present

## 2016-04-19 DIAGNOSIS — I499 Cardiac arrhythmia, unspecified: Secondary | ICD-10-CM | POA: Diagnosis not present

## 2016-04-19 DIAGNOSIS — J189 Pneumonia, unspecified organism: Secondary | ICD-10-CM | POA: Diagnosis not present

## 2016-04-19 DIAGNOSIS — R5383 Other fatigue: Secondary | ICD-10-CM | POA: Diagnosis not present

## 2016-04-19 DIAGNOSIS — I4891 Unspecified atrial fibrillation: Secondary | ICD-10-CM | POA: Diagnosis not present

## 2016-04-20 ENCOUNTER — Ambulatory Visit (HOSPITAL_COMMUNITY)
Admission: RE | Admit: 2016-04-20 | Discharge: 2016-04-20 | Disposition: A | Discharge status: Home / Self Care | Payer: Medicare HMO | Source: Ambulatory Visit | Attending: Nurse Practitioner | Admitting: Nurse Practitioner

## 2016-04-20 ENCOUNTER — Encounter (HOSPITAL_COMMUNITY): Payer: Self-pay | Admitting: Nurse Practitioner

## 2016-04-20 VITALS — BP 116/72 | HR 136 | Ht 68.0 in | Wt 279.0 lb

## 2016-04-20 DIAGNOSIS — Z7982 Long term (current) use of aspirin: Secondary | ICD-10-CM | POA: Insufficient documentation

## 2016-04-20 DIAGNOSIS — Z885 Allergy status to narcotic agent status: Secondary | ICD-10-CM | POA: Diagnosis not present

## 2016-04-20 DIAGNOSIS — G4733 Obstructive sleep apnea (adult) (pediatric): Secondary | ICD-10-CM | POA: Diagnosis not present

## 2016-04-20 DIAGNOSIS — I4891 Unspecified atrial fibrillation: Secondary | ICD-10-CM | POA: Diagnosis not present

## 2016-04-20 DIAGNOSIS — Z87891 Personal history of nicotine dependence: Secondary | ICD-10-CM | POA: Insufficient documentation

## 2016-04-20 DIAGNOSIS — Z96652 Presence of left artificial knee joint: Secondary | ICD-10-CM | POA: Insufficient documentation

## 2016-04-20 DIAGNOSIS — Z888 Allergy status to other drugs, medicaments and biological substances status: Secondary | ICD-10-CM | POA: Diagnosis not present

## 2016-04-20 DIAGNOSIS — I252 Old myocardial infarction: Secondary | ICD-10-CM | POA: Insufficient documentation

## 2016-04-20 DIAGNOSIS — E785 Hyperlipidemia, unspecified: Secondary | ICD-10-CM | POA: Insufficient documentation

## 2016-04-20 DIAGNOSIS — I480 Paroxysmal atrial fibrillation: Secondary | ICD-10-CM | POA: Diagnosis not present

## 2016-04-20 DIAGNOSIS — I10 Essential (primary) hypertension: Secondary | ICD-10-CM | POA: Insufficient documentation

## 2016-04-20 DIAGNOSIS — R569 Unspecified convulsions: Secondary | ICD-10-CM | POA: Diagnosis not present

## 2016-04-20 MED ORDER — DILTIAZEM HCL ER COATED BEADS 120 MG PO CP24: 120.0000 mg | ORAL_CAPSULE | Freq: Every day | ORAL | 3 refills | Status: DC

## 2016-04-20 NOTE — Patient Instructions (Signed)
Your physician has recommended you make the following change in your medication:  1)Stop amlodipine 2)Start Cardizem 133m once a day  The coumadin clinic will be in touch with you in regards to starting coumadin (warfarin)  Try to get cpap supplies to start using cpap machine regularly.

## 2016-04-21 ENCOUNTER — Telehealth: Payer: Self-pay | Admitting: Pharmacist

## 2016-04-21 ENCOUNTER — Other Ambulatory Visit: Payer: Self-pay | Admitting: Physician Assistant

## 2016-04-21 DIAGNOSIS — J189 Pneumonia, unspecified organism: Secondary | ICD-10-CM

## 2016-04-21 MED ORDER — WARFARIN SODIUM 5 MG PO TABS: 5.0000 mg | ORAL_TABLET | Freq: Every day | ORAL | 0 refills | Status: DC

## 2016-04-21 NOTE — Telephone Encounter (Signed)
-----  Message from Juluis Mire, RN sent at 04/20/2016  2:35 PM EST ----- Regarding: new coumadin start Pt will be a new coumadin start. Please contact pt to start this process. Thanks!! Marzetta Board

## 2016-04-21 NOTE — Telephone Encounter (Signed)
Spoke to patient and patient's wife. Pt's wife takes Coumadin herself managed by her PCP. Patient has not yet started warfarin and would like to be managed by our clinic. He has only seen Roderic Palau, NP within our practice. Will start warfarin 69m daily and check INR on Wednesday 04/26/16.

## 2016-04-21 NOTE — Progress Notes (Signed)
Primary Care Physician: Vena Austria, MD Referring Physician: Maude Leriche, PA   Ruben Murray is a 71 y.o. male with a h/o obesity, htn, untreated sleep apnea, seizures, that had recently been treated at [facility] for CAP 1/1-1/5. He had f/u with his PCP 2/14 and was found to be in new onset afib. He had also been treated with prednisone and finished taper last week. He continues to feel weak recovering from the Pneumonia.   He has OSA but has not used cpap in several months. States that it is broken and needs to see if can get a new machine. No alcohol or tobacco. Moderate caffeine use.  Is sedentary and obese. Has a chadsvasc score of at least 3.  Today, he denies symptoms of palpitations, chest pain,   orthopnea, PND, lower extremity edema, dizziness, presyncope, syncope, or neurologic sequela.  Positive for weakness, lack of appetite, shortness of breath with exertion.The patient is tolerating medications without difficulties and is otherwise without complaint today.   Past Medical History:  Diagnosis Date  . CAP (community acquired pneumonia) 03/06/2016  . Hyperlipidemia   . Hypertension   . Myocardial infarction 1980-early 2000s X 2   "mild ones" (03/06/2016)  . OSA on CPAP    "not wearing it right now; need to replace parts" (03/06/2016)  . Pneumonia 02/2016  . Pneumonia    "when I was a kid" (03/06/2016)  . Seizures ([place])    "take RX daily" (03/06/2016)   Past Surgical History:  Procedure Laterality Date  . CIRCUMCISION    . JOINT REPLACEMENT    . PERCUTANEOUS PINNING TOE FRACTURE Left    "big toe  . REPLACEMENT TOTAL KNEE Left   . TONSILLECTOMY AND ADENOIDECTOMY      Current Outpatient Prescriptions  Medication Sig Dispense Refill  . acetaminophen (TYLENOL) 500 MG tablet Take 1,000 mg by mouth every 6 (six) hours as needed (Pain).    Marland Kitchen albuterol (PROVENTIL HFA;VENTOLIN HFA) 108 (90 Base) MCG/ACT inhaler Inhale 2 puffs into the lungs every 2 (two) hours as needed  for wheezing or shortness of breath (cough). 1 Inhaler 0  . aspirin EC 81 MG tablet Take 81 mg by mouth daily.    . benzonatate (TESSALON) 100 MG capsule Take 1 capsule (100 mg total) by mouth 3 (three) times daily as needed for cough. 20 capsule 0  . feeding supplement, ENSURE ENLIVE, (ENSURE ENLIVE) LIQD Take 237 mLs by mouth 2 (two) times daily between meals. 237 mL 12  . guaiFENesin (MUCINEX) 600 MG 12 hr tablet Take 2 tablets (1,200 mg total) by mouth 2 (two) times daily. 30 tablet 0  . ipratropium (ATROVENT) 0.03 % nasal spray Place 2 sprays into both nostrils 3 (three) times daily. 30 mL 0  . losartan-hydrochlorothiazide (HYZAAR) 100-12.5 MG per tablet Take 1 tablet by mouth every morning.    . Multiple Vitamin (MULITIVITAMIN WITH MINERALS) TABS Take 1 tablet by mouth every morning.    Marland Kitchen PHENobarbital (LUMINAL) 97.2 MG tablet Take 97.2 mg by mouth every morning.    . polyethylene glycol (MIRALAX / GLYCOLAX) packet Take 17 g by mouth daily as needed. 14 each 0  . pravastatin (PRAVACHOL) 40 MG tablet Take 40 mg by mouth daily.    . tamsulosin (FLOMAX) 0.4 MG CAPS capsule Take 1 capsule (0.4 mg total) by mouth daily. 30 capsule 1  . diltiazem (CARDIZEM CD) 120 MG 24 hr capsule Take 1 capsule (120 mg total) by mouth daily. 30 capsule 3  No current facility-administered medications for this encounter.     Allergies  Allergen Reactions  . Dilaudid [Hydromorphone Hcl] Other (See Comments)    Reaction:  Makes pt hyper   . Morphine And Related Other (See Comments)    Pt states that this medication makes him feel like he is out of it.    . Tegretol [Carbamazepine] Hives    Social History   Social History  . Marital status: Married    Spouse name: N/A  . Number of children: N/A  . Years of education: N/A   Occupational History  . Not on file.   Social History Main Topics  . Smoking status: Former Smoker    Packs/day: 3.00    Years: 48.00    Types: Cigarettes    Quit date: 2000    . Smokeless tobacco: Never Used  . Alcohol use No  . Drug use: No  . Sexual activity: No   Other Topics Concern  . Not on file   Social History Narrative  . No narrative on file    Family History  Problem Relation Age of Onset  . Family history unknown: Yes    ROS- All systems are reviewed and negative except as per the HPI above  Physical Exam: Vitals:   04/20/16 1355  BP: 116/72  Pulse: (!) 136  Weight: 279 lb (126.6 kg)  Height: 5' 8" (1.727 m)   Wt Readings from Last 3 Encounters:  04/20/16 279 lb (126.6 kg)  03/06/16 282 lb (127.9 kg)  02/14/16 282 lb (127.9 kg)    Labs: Lab Results  Component Value Date   NA 138 03/10/2016   K 3.7 03/10/2016   CL 100 (L) 03/10/2016   CO2 30 03/10/2016   GLUCOSE 110 (H) 03/10/2016   BUN 15 03/10/2016   CREATININE 0.71 03/10/2016   CALCIUM 8.6 (L) 03/10/2016   MG 2.5 (H) 03/10/2016   Lab Results  Component Value Date   INR 1.92 (H) 05/14/2010   No results found for: CHOL, HDL, LDLCALC, TRIG   GEN- The patient is well appearing, alert and oriented x 3 today.   Head- normocephalic, atraumatic Eyes-  Sclera clear, conjunctiva pink Ears- hearing intact Oropharynx- clear Neck- supple, no JVP Lymph- no cervical lymphadenopathy Lungs- Clear to ausculation bilaterally, normal work of breathing Heart- irregular rate and rhythm, no murmurs, rubs or gallops, PMI not laterally displaced GI- soft, NT, ND, + BS Extremities- no clubbing, cyanosis, or edema MS- no significant deformity or atrophy Skin- no rash or lesion Psych- euthymic mood, full affect Neuro- strength and sensation are intact  EKG- afib at 136 bpm, qrs int 142 ms, qtc 535 ms Epic records reviewed    Assessment and Plan: 1. New onset afib  General education re afib discussed with pt and wife Stop amlodipine and start cardizem 120 mg qd, and may need further up titration for rate control Has a chadsvasc score of at least 3 and will need  anticoagulation Due to presence of phenobarbital will not be able to take eliquis or xarelto due to drug/drug interaction Wife takes warfarin and he would like to start on this Will refer to coumadin clinic Encouraged to get cpap machine fixed and start back to using nightly Echo  Encouraged to cut back on caffeine  F/u in afib clinic early next week to f/u on rate control  Donna C. Carroll, [facility] [address] [place], [place] [address] [phone]

## 2016-04-24 ENCOUNTER — Ambulatory Visit (HOSPITAL_COMMUNITY)
Admission: RE | Admit: 2016-04-24 | Discharge: 2016-04-24 | Disposition: A | Discharge status: Home / Self Care | Payer: Medicare HMO | Source: Ambulatory Visit | Attending: Nurse Practitioner | Admitting: Nurse Practitioner

## 2016-04-24 ENCOUNTER — Encounter (HOSPITAL_COMMUNITY): Payer: Self-pay | Admitting: Nurse Practitioner

## 2016-04-24 ENCOUNTER — Other Ambulatory Visit (HOSPITAL_COMMUNITY): Payer: Self-pay | Admitting: *Deleted

## 2016-04-24 VITALS — BP 118/72 | HR 75 | Ht 68.0 in | Wt 284.4 lb

## 2016-04-24 DIAGNOSIS — Z7901 Long term (current) use of anticoagulants: Secondary | ICD-10-CM | POA: Diagnosis not present

## 2016-04-24 DIAGNOSIS — G4733 Obstructive sleep apnea (adult) (pediatric): Secondary | ICD-10-CM | POA: Diagnosis not present

## 2016-04-24 DIAGNOSIS — I252 Old myocardial infarction: Secondary | ICD-10-CM | POA: Diagnosis not present

## 2016-04-24 DIAGNOSIS — Z96652 Presence of left artificial knee joint: Secondary | ICD-10-CM | POA: Diagnosis not present

## 2016-04-24 DIAGNOSIS — R569 Unspecified convulsions: Secondary | ICD-10-CM | POA: Insufficient documentation

## 2016-04-24 DIAGNOSIS — Z888 Allergy status to other drugs, medicaments and biological substances status: Secondary | ICD-10-CM | POA: Insufficient documentation

## 2016-04-24 DIAGNOSIS — Z7982 Long term (current) use of aspirin: Secondary | ICD-10-CM | POA: Diagnosis not present

## 2016-04-24 DIAGNOSIS — I4891 Unspecified atrial fibrillation: Secondary | ICD-10-CM | POA: Insufficient documentation

## 2016-04-24 DIAGNOSIS — Z885 Allergy status to narcotic agent status: Secondary | ICD-10-CM | POA: Insufficient documentation

## 2016-04-24 DIAGNOSIS — E785 Hyperlipidemia, unspecified: Secondary | ICD-10-CM | POA: Insufficient documentation

## 2016-04-24 DIAGNOSIS — I480 Paroxysmal atrial fibrillation: Secondary | ICD-10-CM | POA: Diagnosis not present

## 2016-04-24 DIAGNOSIS — Z87891 Personal history of nicotine dependence: Secondary | ICD-10-CM | POA: Diagnosis not present

## 2016-04-24 DIAGNOSIS — I10 Essential (primary) hypertension: Secondary | ICD-10-CM | POA: Insufficient documentation

## 2016-04-24 LAB — PROTIME-INR
INR: 1.58
PROTHROMBIN TIME: 19 s — AB (ref 11.4–15.2)

## 2016-04-24 MED ORDER — WARFARIN SODIUM 1 MG PO TABS: 1.0000 mg | ORAL_TABLET | Freq: Every day | ORAL | 3 refills | Status: DC

## 2016-04-24 NOTE — Progress Notes (Signed)
Primary Care Physician: Vena Austria, MD Referring Physician: Maude Leriche, PA   Ruben CUFFE is a 71 y.o. male with a h/o obesity, htn, untreated sleep apnea, seizures, that had recently been treated at [facility] for CAP 1/1-1/5. He had f/u with his PCP 2/14 and was found to be in new onset afib. He had also been treated with prednisone and finished taper last week. He continues to feel weak recovering from the Pneumonia.   He has OSA but has not used cpap in several months. States that it is broken and needs to see if can get a new machine. No alcohol or tobacco. Moderate caffeine use.  Is sedentary and obese. Has a chadsvasc score of at least 3.  F/u afib clinic 2/19, he was started on warfarin 5 mg on Friday and started diltiazem 120 mg qd. He has returned to [place]. He thinks he feels a little better. He has an appointment to see Dr.Osborne to get his cpap problems addressed. Echo is pending.  Today, he denies symptoms of palpitations, chest pain,   orthopnea, PND, lower extremity edema, dizziness, presyncope, syncope, or neurologic sequela.  Positive for weakness, lack of appetite, shortness of breath with exertion.The patient is tolerating medications without difficulties and is otherwise without complaint today.   Past Medical History:  Diagnosis Date  . CAP (community acquired pneumonia) 03/06/2016  . Hyperlipidemia   . Hypertension   . Myocardial infarction 1980-early 2000s X 2   "mild ones" (03/06/2016)  . OSA on CPAP    "not wearing it right now; need to replace parts" (03/06/2016)  . Pneumonia 02/2016  . Pneumonia    "when I was a kid" (03/06/2016)  . Seizures ([place])    "take RX daily" (03/06/2016)   Past Surgical History:  Procedure Laterality Date  . CIRCUMCISION    . JOINT REPLACEMENT    . PERCUTANEOUS PINNING TOE FRACTURE Left    "big toe  . REPLACEMENT TOTAL KNEE Left   . TONSILLECTOMY AND ADENOIDECTOMY      Current Outpatient Prescriptions  Medication Sig  Dispense Refill  . acetaminophen (TYLENOL) 500 MG tablet Take 1,000 mg by mouth every 6 (six) hours as needed (Pain).    Marland Kitchen albuterol (PROVENTIL HFA;VENTOLIN HFA) 108 (90 Base) MCG/ACT inhaler Inhale 2 puffs into the lungs every 2 (two) hours as needed for wheezing or shortness of breath (cough). 1 Inhaler 0  . aspirin EC 81 MG tablet Take 81 mg by mouth daily.    . benzonatate (TESSALON) 100 MG capsule Take 1 capsule (100 mg total) by mouth 3 (three) times daily as needed for cough. 20 capsule 0  . diltiazem (CARDIZEM CD) 120 MG 24 hr capsule Take 1 capsule (120 mg total) by mouth daily. 30 capsule 3  . feeding supplement, ENSURE ENLIVE, (ENSURE ENLIVE) LIQD Take 237 mLs by mouth 2 (two) times daily between meals. 237 mL 12  . guaiFENesin (MUCINEX) 600 MG 12 hr tablet Take 2 tablets (1,200 mg total) by mouth 2 (two) times daily. 30 tablet 0  . ipratropium (ATROVENT) 0.03 % nasal spray Place 2 sprays into both nostrils 3 (three) times daily. 30 mL 0  . losartan-hydrochlorothiazide (HYZAAR) 100-12.5 MG per tablet Take 1 tablet by mouth every morning.    . Multiple Vitamin (MULITIVITAMIN WITH MINERALS) TABS Take 1 tablet by mouth every morning.    Marland Kitchen PHENobarbital (LUMINAL) 97.2 MG tablet Take 97.2 mg by mouth every morning.    . polyethylene glycol (MIRALAX /  GLYCOLAX) packet Take 17 g by mouth daily as needed. 14 each 0  . pravastatin (PRAVACHOL) 40 MG tablet Take 40 mg by mouth daily.    . tamsulosin (FLOMAX) 0.4 MG CAPS capsule Take 1 capsule (0.4 mg total) by mouth daily. 30 capsule 1  . warfarin (COUMADIN) 5 MG tablet Take 1 tablet (5 mg total) by mouth daily. 30 tablet 0   No current facility-administered medications for this encounter.     Allergies  Allergen Reactions  . Dilaudid [Hydromorphone Hcl] Other (See Comments)    Reaction:  Makes pt hyper   . Morphine And Related Other (See Comments)    Pt states that this medication makes him feel like he is out of it.    . Tegretol  [Carbamazepine] Hives    Social History   Social History  . Marital status: Married    Spouse name: N/A  . Number of children: N/A  . Years of education: N/A   Occupational History  . Not on file.   Social History Main Topics  . Smoking status: Former Smoker    Packs/day: 3.00    Years: 48.00    Types: Cigarettes    Quit date: 2000  . Smokeless tobacco: Never Used  . Alcohol use No  . Drug use: No  . Sexual activity: No   Other Topics Concern  . Not on file   Social History Narrative  . No narrative on file    Family History  Problem Relation Age of Onset  . Family history unknown: Yes    ROS- All systems are reviewed and negative except as per the HPI above  Physical Exam: Vitals:   04/24/16 0951  BP: 118/72  Pulse: 75  Weight: 284 lb 6.4 oz (129 kg)  Height: _0  (1.727 m)   Wt Readings from Last 3 Encounters:  04/24/16 284 lb 6.4 oz (129 kg)  04/20/16 279 lb (126.6 kg)  03/06/16 282 lb (127.9 kg)    Labs: Lab Results  Component Value Date   NA 138 03/10/2016   K 3.7 03/10/2016   CL 100 (L) 03/10/2016   CO2 30 03/10/2016   GLUCOSE 110 (H) 03/10/2016   BUN 15 03/10/2016   CREATININE 0.71 03/10/2016   CALCIUM 8.6 (L) 03/10/2016   MG 2.5 (H) 03/10/2016   Lab Results  Component Value Date   INR 1.92 (H) 05/14/2010   No results found for: CHOL, HDL, LDLCALC, TRIG   GEN- The patient is well appearing, alert and oriented x 3 today.   Head- normocephalic, atraumatic Eyes-  Sclera clear, conjunctiva pink Ears- hearing intact Oropharynx- clear Neck- supple, no JVP Lymph- no cervical lymphadenopathy Lungs- Clear to ausculation bilaterally, normal work of breathing Heart-regular rate and rhythm, no murmurs, rubs or gallops, PMI not laterally displaced GI- soft, NT, ND, + BS Extremities- no clubbing, cyanosis, or edema MS- no significant deformity or atrophy Skin- no rash or lesion Psych- euthymic mood, full affect Neuro- strength and  sensation are intact  EKG- NSR at 75 bpm, pr int 154 ms, qrs int 143 ms, qtc 473 ms [facility] Epic records reviewed    Assessment and Plan: 1. New onset afib  Has converted to SR with diltiazem Continue 120 mg a day Continue warfarin 5 mg a day with INR today and asked for him to see PCP for INR again on Friday Chadsvasc score of at least 3 and will need anticoagulation Due to presence of phenobarbital will not be able  to take eliquis or xarelto due to drug/drug interaction Encouraged to get cpap machine fixed and start back to using nightly Echo pending Encouraged to cut back on caffeine  F/u in afib clinic  In 2 weeks  Butch Penny C. Janeene Sand, [facility] [address] [place], [place] [address] [phone]

## 2016-04-25 ENCOUNTER — Ambulatory Visit
Admission: RE | Admit: 2016-04-25 | Discharge: 2016-04-25 | Disposition: A | Discharge status: Home / Self Care | Payer: Medicare HMO | Source: Ambulatory Visit | Attending: Physician Assistant | Admitting: Physician Assistant

## 2016-04-25 DIAGNOSIS — J181 Lobar pneumonia, unspecified organism: Secondary | ICD-10-CM | POA: Diagnosis not present

## 2016-04-25 DIAGNOSIS — J189 Pneumonia, unspecified organism: Secondary | ICD-10-CM

## 2016-04-26 ENCOUNTER — Ambulatory Visit (HOSPITAL_COMMUNITY)
Admission: RE | Admit: 2016-04-26 | Discharge: 2016-04-26 | Disposition: A | Discharge status: Home / Self Care | Payer: Medicare HMO | Source: Ambulatory Visit | Attending: Nurse Practitioner | Admitting: Nurse Practitioner

## 2016-04-26 DIAGNOSIS — I361 Nonrheumatic tricuspid (valve) insufficiency: Secondary | ICD-10-CM | POA: Insufficient documentation

## 2016-04-26 DIAGNOSIS — I501 Left ventricular failure: Secondary | ICD-10-CM | POA: Insufficient documentation

## 2016-04-26 DIAGNOSIS — I480 Paroxysmal atrial fibrillation: Secondary | ICD-10-CM | POA: Insufficient documentation

## 2016-04-26 DIAGNOSIS — I340 Nonrheumatic mitral (valve) insufficiency: Secondary | ICD-10-CM | POA: Diagnosis not present

## 2016-04-26 LAB — ECHOCARDIOGRAM COMPLETE
AVLVOTPG: 4 mmHg
CHL CUP MV DEC (S): 268
CHL CUP TV REG PEAK VELOCITY: 292 cm/s
E decel time: 268 msec
E/e' ratio: 7.25
FS: 18 % — AB (ref 28–44)
IVS/LV PW RATIO, ED: 1.05
LA diam end sys: 48 mm
LA diam index: 2.03 cm/m2
LA vol index: 29.9 mL/m2
LA vol: 70.8 mL
LASIZE: 48 mm
LAVOLA4C: 59.2 mL
LDCA: 3.8 cm2
LV E/e' medial: 7.25
LV E/e'average: 7.25
LV PW d: 9.74 mm — AB (ref 0.6–1.1)
LV TDI E'LATERAL: 9.9
LV TDI E'MEDIAL: 8.16
LV dias vol index: 99 mL/m2
LV e' LATERAL: 9.9 cm/s
LV sys vol: 144 mL — AB (ref 21–61)
LVDIAVOL: 234 mL — AB (ref 62–150)
LVOT SV: 94 mL
LVOT VTI: 24.7 cm
LVOT peak vel: 99 cm/s
LVOTD: 22 mm
LVSYSVOLIN: 61 mL/m2
Lateral S' vel: 11 cm/s
MV Peak grad: 2 mmHg
MV pk E vel: 71.8 m/s
MVPKAVEL: 60.1 m/s
RV TAPSE: 24.8 mm
RV sys press: 42 mmHg
Simpson's disk: 39
Stroke v: 90 ml
TRMAXVEL: 292 cm/s

## 2016-04-26 NOTE — Progress Notes (Signed)
  Echocardiogram 2D Echocardiogram has been performed.  Arlee Bossard L Androw 04/26/2016, 3:52 PM

## 2016-04-28 DIAGNOSIS — I4891 Unspecified atrial fibrillation: Secondary | ICD-10-CM | POA: Diagnosis not present

## 2016-04-28 DIAGNOSIS — Z7901 Long term (current) use of anticoagulants: Secondary | ICD-10-CM | POA: Diagnosis not present

## 2016-05-02 DIAGNOSIS — Z7901 Long term (current) use of anticoagulants: Secondary | ICD-10-CM | POA: Diagnosis not present

## 2016-05-02 DIAGNOSIS — I4891 Unspecified atrial fibrillation: Secondary | ICD-10-CM | POA: Diagnosis not present

## 2016-05-02 DIAGNOSIS — G4733 Obstructive sleep apnea (adult) (pediatric): Secondary | ICD-10-CM | POA: Diagnosis not present

## 2016-05-08 ENCOUNTER — Ambulatory Visit (HOSPITAL_COMMUNITY)
Admission: RE | Admit: 2016-05-08 | Discharge: 2016-05-08 | Disposition: A | Discharge status: Home / Self Care | Payer: Medicare HMO | Source: Ambulatory Visit | Attending: Nurse Practitioner | Admitting: Nurse Practitioner

## 2016-05-08 ENCOUNTER — Encounter (HOSPITAL_COMMUNITY): Payer: Self-pay | Admitting: Nurse Practitioner

## 2016-05-08 VITALS — BP 126/68 | HR 77 | Ht 68.0 in | Wt 281.8 lb

## 2016-05-08 DIAGNOSIS — Z7901 Long term (current) use of anticoagulants: Secondary | ICD-10-CM | POA: Diagnosis not present

## 2016-05-08 DIAGNOSIS — I493 Ventricular premature depolarization: Secondary | ICD-10-CM | POA: Insufficient documentation

## 2016-05-08 DIAGNOSIS — I480 Paroxysmal atrial fibrillation: Secondary | ICD-10-CM | POA: Diagnosis not present

## 2016-05-08 DIAGNOSIS — I10 Essential (primary) hypertension: Secondary | ICD-10-CM | POA: Diagnosis not present

## 2016-05-08 DIAGNOSIS — I4891 Unspecified atrial fibrillation: Secondary | ICD-10-CM | POA: Diagnosis present

## 2016-05-08 DIAGNOSIS — Z87891 Personal history of nicotine dependence: Secondary | ICD-10-CM | POA: Insufficient documentation

## 2016-05-08 DIAGNOSIS — G4733 Obstructive sleep apnea (adult) (pediatric): Secondary | ICD-10-CM | POA: Insufficient documentation

## 2016-05-08 DIAGNOSIS — Z79899 Other long term (current) drug therapy: Secondary | ICD-10-CM | POA: Insufficient documentation

## 2016-05-08 DIAGNOSIS — I4510 Unspecified right bundle-branch block: Secondary | ICD-10-CM | POA: Insufficient documentation

## 2016-05-08 DIAGNOSIS — I498 Other specified cardiac arrhythmias: Secondary | ICD-10-CM | POA: Insufficient documentation

## 2016-05-08 DIAGNOSIS — E669 Obesity, unspecified: Secondary | ICD-10-CM | POA: Diagnosis not present

## 2016-05-08 DIAGNOSIS — Z7982 Long term (current) use of aspirin: Secondary | ICD-10-CM | POA: Diagnosis not present

## 2016-05-08 MED ORDER — CARVEDILOL 6.25 MG PO TABS: 6.2500 mg | ORAL_TABLET | Freq: Two times a day (BID) | ORAL | 3 refills | Status: DC

## 2016-05-08 MED ORDER — METOPROLOL SUCCINATE ER 25 MG PO TB24: 25.0000 mg | ORAL_TABLET | Freq: Every day | ORAL | 6 refills | Status: DC

## 2016-05-08 NOTE — Progress Notes (Signed)
Primary Care Physician: Vena Austria, MD Referring Physician: Maude Leriche, PA   Ruben Murray is a 71 y.o. male with a h/o obesity, htn, untreated sleep apnea, seizures, that had recently been treated at [facility] for CAP 1/1-1/5. He had f/u with his PCP 2/14 and was found to be in new onset afib. Being seen in [facility] 2/15.  He had also been treated with prednisone and finished taper last week. He continues to feel weak recovering from the Pneumonia.   He has OSA but has not used cpap in several months. States that it is broken and needs to see if can get a new machine. No alcohol or tobacco. Moderate caffeine use.  Is sedentary and obese. Has a chadsvasc score of at least 3.  F/u afib clinic 2/19, he was started on warfarin 5 mg on Friday and started diltiazem 120 mg qd. He has returned to [place]. He thinks he feels a little better. He has an appointment to see Dr.Osborne to get his cpap problems addressed. Echo is pending.  Being seen afib clinc 05/08/16 for f/u echo. It did show EF of 40-45% and moderate RAE, RVSP 42 mm, mod TR. Pt has seen Dr. Maxwell Caul for treatment of his sleep apnea, which he was not treating and will be placed on auto cpap with a different mask which he is due to pick up soon to start new treatment. He also reports that he got severely constipated over the weekend and may be due to cardizem. He continues in SR. He states that he has had two mild MI's in the past,around 2005.  Today, he denies symptoms of palpitations, chest pain,   orthopnea, PND, lower extremity edema, dizziness, presyncope, syncope, or neurologic sequela.  Positive for weakness, lack of appetite, shortness of breath with exertion.The patient is tolerating medications without difficulties and is otherwise without complaint today.   Past Medical History:  Diagnosis Date  . CAP (community acquired pneumonia) 03/06/2016  . Hyperlipidemia   . Hypertension   . Myocardial infarction 1980-early 2000s  X 2   "mild ones" (03/06/2016)  . OSA on CPAP    "not wearing it right now; need to replace parts" (03/06/2016)  . Pneumonia 02/2016  . Pneumonia    "when I was a kid" (03/06/2016)  . Seizures ([place])    "take RX daily" (03/06/2016)   Past Surgical History:  Procedure Laterality Date  . CIRCUMCISION    . JOINT REPLACEMENT    . PERCUTANEOUS PINNING TOE FRACTURE Left    "big toe  . REPLACEMENT TOTAL KNEE Left   . TONSILLECTOMY AND ADENOIDECTOMY      Current Outpatient Prescriptions  Medication Sig Dispense Refill  . acetaminophen (TYLENOL) 500 MG tablet Take 1,000 mg by mouth every 6 (six) hours as needed (Pain).    Marland Kitchen albuterol (PROVENTIL HFA;VENTOLIN HFA) 108 (90 Base) MCG/ACT inhaler Inhale 2 puffs into the lungs every 2 (two) hours as needed for wheezing or shortness of breath (cough). 1 Inhaler 0  . aspirin EC 81 MG tablet Take 81 mg by mouth daily.    . feeding supplement, ENSURE ENLIVE, (ENSURE ENLIVE) LIQD Take 237 mLs by mouth 2 (two) times daily between meals. 237 mL 12  . guaiFENesin (MUCINEX) 600 MG 12 hr tablet Take 2 tablets (1,200 mg total) by mouth 2 (two) times daily. 30 tablet 0  . ipratropium (ATROVENT) 0.03 % nasal spray Place 2 sprays into both nostrils 3 (three) times daily. 30 mL 0  .  losartan-hydrochlorothiazide (HYZAAR) 100-12.5 MG per tablet Take 1 tablet by mouth every morning.    . Multiple Vitamin (MULITIVITAMIN WITH MINERALS) TABS Take 1 tablet by mouth every morning.    Marland Kitchen PHENobarbital (LUMINAL) 97.2 MG tablet Take 97.2 mg by mouth every morning.    . polyethylene glycol (MIRALAX / GLYCOLAX) packet Take 17 g by mouth daily as needed. 14 each 0  . pravastatin (PRAVACHOL) 40 MG tablet Take 40 mg by mouth daily.    . tamsulosin (FLOMAX) 0.4 MG CAPS capsule Take 1 capsule (0.4 mg total) by mouth daily. 30 capsule 1  . warfarin (COUMADIN) 1 MG tablet Take 1 tablet (1 mg total) by mouth daily. Take along with the 14m tablet to make 648mtotal daily 30 tablet 3  .  warfarin (COUMADIN) 5 MG tablet Take 1 tablet (5 mg total) by mouth daily. 30 tablet 0  . carvedilol (COREG) 6.25 MG tablet Take 1 tablet (6.25 mg total) by mouth 2 (two) times daily with a meal. 60 tablet 3   No current facility-administered medications for this encounter.     Allergies  Allergen Reactions  . Dilaudid [Hydromorphone Hcl] Other (See Comments)    Reaction:  Makes pt hyper   . Morphine And Related Other (See Comments)    Pt states that this medication makes him feel like he is out of it.    . Tegretol [Carbamazepine] Hives    Social History   Social History  . Marital status: Married    Spouse name: N/A  . Number of children: N/A  . Years of education: N/A   Occupational History  . Not on file.   Social History Main Topics  . Smoking status: Former Smoker    Packs/day: 3.00    Years: 48.00    Types: Cigarettes    Quit date: 2000  . Smokeless tobacco: Never Used  . Alcohol use No  . Drug use: No  . Sexual activity: No   Other Topics Concern  . Not on file   Social History Narrative  . No narrative on file    Family History  Problem Relation Age of Onset  . Family history unknown: Yes    ROS- All systems are reviewed and negative except as per the HPI above  Physical Exam: Vitals:   05/08/16 1033  BP: 126/68  Pulse: 77  Weight: 281 lb 12.8 oz (127.8 kg)  Height: _0  (1.727 m)   Wt Readings from Last 3 Encounters:  05/08/16 281 lb 12.8 oz (127.8 kg)  04/24/16 284 lb 6.4 oz (129 kg)  04/20/16 279 lb (126.6 kg)    Labs: Lab Results  Component Value Date   NA 138 03/10/2016   K 3.7 03/10/2016   CL 100 (L) 03/10/2016   CO2 30 03/10/2016   GLUCOSE 110 (H) 03/10/2016   BUN 15 03/10/2016   CREATININE 0.71 03/10/2016   CALCIUM 8.6 (L) 03/10/2016   MG 2.5 (H) 03/10/2016   Lab Results  Component Value Date   INR 1.58 04/24/2016   No results found for: CHOL, HDL, LDLCALC, TRIG   GEN- The patient is well appearing, alert and  oriented x 3 today.   Head- normocephalic, atraumatic Eyes-  Sclera clear, conjunctiva pink Ears- hearing intact Oropharynx- clear Neck- supple, no JVP Lymph- no cervical lymphadenopathy Lungs- Clear to ausculation bilaterally, normal work of breathing Heart-regular rate and rhythm, no murmurs, rubs or gallops, PMI not laterally displaced GI- soft, NT, ND, + BS Extremities-  no clubbing, cyanosis, or edema MS- no significant deformity or atrophy Skin- no rash or lesion Psych- euthymic mood, full affect Neuro- strength and sensation are intact  EKG- NSR at 77 bpm, with PVC's,pr int 158 ms, qrs int 148 ms, qtc 479 ms [facility] Epic records reviewed    Assessment and Plan: 1. New onset afib  Has converted to SR with diltiazem Constipated with cardizem Will change to carvedilol 6.125 mg bid which will be better for LV dysfuction Continue warfarin 5 mg a day with INR followed by PCP, last one per pt 3.1, for chadsvasc score of 3 Due to presence of phenobarbital will not be able to take eliquis or xarelto due to drug/drug interaction Encouraged to get new cpap started and start back to using nightly   Will request appointment with general cardiology to follow for abnormal echo and pt's report of heart disease in the past  afib clinic as needed   [place]. Annel Zunker, [facility] [address] [place], [place] [address] [phone]

## 2016-05-08 NOTE — Patient Instructions (Addendum)
Your physician has recommended you make the following change in your medication: 1)Stop Diltiazem 2)Start coreg 6.43m twice a day  Scheduler will be in touch with you to schedule an appointment with Dr. SMarlou Porch-- in 1 month.

## 2016-05-12 DIAGNOSIS — Z7901 Long term (current) use of anticoagulants: Secondary | ICD-10-CM | POA: Diagnosis not present

## 2016-05-12 DIAGNOSIS — I4891 Unspecified atrial fibrillation: Secondary | ICD-10-CM | POA: Diagnosis not present

## 2016-05-19 DIAGNOSIS — Z7901 Long term (current) use of anticoagulants: Secondary | ICD-10-CM | POA: Diagnosis not present

## 2016-05-19 DIAGNOSIS — I4891 Unspecified atrial fibrillation: Secondary | ICD-10-CM | POA: Diagnosis not present

## 2016-05-20 ENCOUNTER — Other Ambulatory Visit: Payer: Self-pay | Admitting: Internal Medicine

## 2016-05-22 ENCOUNTER — Other Ambulatory Visit: Payer: Self-pay | Admitting: *Deleted

## 2016-05-23 ENCOUNTER — Encounter: Payer: Self-pay | Admitting: Cardiology

## 2016-05-23 DIAGNOSIS — G4733 Obstructive sleep apnea (adult) (pediatric): Secondary | ICD-10-CM | POA: Diagnosis not present

## 2016-05-31 DIAGNOSIS — Z7901 Long term (current) use of anticoagulants: Secondary | ICD-10-CM | POA: Diagnosis not present

## 2016-05-31 DIAGNOSIS — I4891 Unspecified atrial fibrillation: Secondary | ICD-10-CM | POA: Diagnosis not present

## 2016-06-01 ENCOUNTER — Encounter: Payer: Self-pay | Admitting: Cardiology

## 2016-06-01 ENCOUNTER — Ambulatory Visit (INDEPENDENT_AMBULATORY_CARE_PROVIDER_SITE_OTHER): Payer: Medicare HMO | Admitting: Cardiology

## 2016-06-01 VITALS — BP 140/86 | HR 76 | Ht 68.0 in | Wt 285.8 lb

## 2016-06-01 DIAGNOSIS — I420 Dilated cardiomyopathy: Secondary | ICD-10-CM | POA: Diagnosis not present

## 2016-06-01 DIAGNOSIS — I480 Paroxysmal atrial fibrillation: Secondary | ICD-10-CM | POA: Diagnosis not present

## 2016-06-01 MED ORDER — CARVEDILOL 12.5 MG PO TABS: 12.5000 mg | ORAL_TABLET | Freq: Two times a day (BID) | ORAL | 6 refills | Status: DC

## 2016-06-01 NOTE — Progress Notes (Signed)
Cardiology Office Note    Date:  06/01/2016   ID:  MASAHIRO IGLESIA, DOB November 16, 1945, MRN [id number]  PCP:  Vena Austria, MD  Cardiologist:   [name], MD   Chief Complaint  Patient presents with  . New Patient (Initial Visit)    irregular echo    History of Present Illness:  Ruben Murray is a 71 y.o. male with atrial fibrillation, sleep apnea, previously seen in the atrial fibrillation clinic.  He was hospitalized for pneumonia and bronchitis for 6 days and ended up likely going into atrial fibrillation after that point. He then saw Roderic Palau in the atrial fibrillation clinic. Warfarin was started.   No CP. +SOB, mild at baseline. Still having a cough.. Can not sleep flat because of back. Since he sleeps in the recliner, Dr. Maxwell Caul states that he does not need his CPAP. I'm okay with this.  Ejection fraction was 40-45%. Warfarin was utilized. Had constipation with Cardizem. Converted to sinus rhythm with diltiazem. He was changed to carvedilol 6.125 mg twice a day because of his LV dysfunction. Because he is on an anti-seizure medication phenobarbital he was not able to take Eliquis or Xarelto. If they can change him over to [place] or Eliquis would be a possibility.      Past Medical History:  Diagnosis Date  . CAP (community acquired pneumonia) 03/06/2016  . Hyperlipidemia   . Hypertension   . Myocardial infarction 1980-early 2000s X 2   "mild ones" (03/06/2016)  . OSA on CPAP    "not wearing it right now; need to replace parts" (03/06/2016)  . Pneumonia 02/2016  . Pneumonia    "when I was a kid" (03/06/2016)  . Seizures ([place])    "take RX daily" (03/06/2016)    Past Surgical History:  Procedure Laterality Date  . CIRCUMCISION    . JOINT REPLACEMENT    . PERCUTANEOUS PINNING TOE FRACTURE Left    "big toe  . REPLACEMENT TOTAL KNEE Left   . TONSILLECTOMY AND ADENOIDECTOMY      Current Medications: Outpatient Medications Prior to Visit    Medication Sig Dispense Refill  . acetaminophen (TYLENOL) 500 MG tablet Take 1,000 mg by mouth every 6 (six) hours as needed (Pain).    Marland Kitchen losartan-hydrochlorothiazide (HYZAAR) 100-12.5 MG per tablet Take 1 tablet by mouth every morning.    . Multiple Vitamin (MULITIVITAMIN WITH MINERALS) TABS Take 1 tablet by mouth every morning.    Marland Kitchen PHENobarbital (LUMINAL) 97.2 MG tablet Take 97.2 mg by mouth every morning.    . polyethylene glycol (MIRALAX / GLYCOLAX) packet Take 17 g by mouth daily as needed. 14 each 0  . pravastatin (PRAVACHOL) 40 MG tablet Take 40 mg by mouth daily.    . tamsulosin (FLOMAX) 0.4 MG CAPS capsule Take 1 capsule (0.4 mg total) by mouth daily. 30 capsule 1  . warfarin (COUMADIN) 1 MG tablet Take 1 tablet (1 mg total) by mouth daily. Take along with the 67m tablet to make 677mtotal daily 30 tablet 3  . warfarin (COUMADIN) 5 MG tablet Take 1 tablet (5 mg total) by mouth daily. 30 tablet 0  . aspirin EC 81 MG tablet Take 81 mg by mouth daily.    . carvedilol (COREG) 6.25 MG tablet Take 1 tablet (6.25 mg total) by mouth 2 (two) times daily with a meal. 60 tablet 3  . albuterol (PROVENTIL HFA;VENTOLIN HFA) 108 (90 Base) MCG/ACT inhaler Inhale 2 puffs into the lungs every  2 (two) hours as needed for wheezing or shortness of breath (cough). (Patient not taking: Reported on 06/01/2016) 1 Inhaler 0  . feeding supplement, ENSURE ENLIVE, (ENSURE ENLIVE) LIQD Take 237 mLs by mouth 2 (two) times daily between meals. (Patient not taking: Reported on 06/01/2016) 237 mL 12  . guaiFENesin (MUCINEX) 600 MG 12 hr tablet Take 2 tablets (1,200 mg total) by mouth 2 (two) times daily. (Patient not taking: Reported on 06/01/2016) 30 tablet 0  . ipratropium (ATROVENT) 0.03 % nasal spray Place 2 sprays into both nostrils 3 (three) times daily. (Patient not taking: Reported on 06/01/2016) 30 mL 0   No facility-administered medications prior to visit.      Allergies:   Dilaudid [hydromorphone hcl]; Morphine  and related; and Tegretol [carbamazepine]   Social History   Social History  . Marital status: Married    Spouse name: N/A  . Number of children: N/A  . Years of education: N/A   Social History Main Topics  . Smoking status: Former Smoker    Packs/day: 3.00    Years: 48.00    Types: Cigarettes    Quit date: 2000  . Smokeless tobacco: Never Used  . Alcohol use No  . Drug use: No  . Sexual activity: No   Other Topics Concern  . None   Social History Narrative  . None     Family History:  The patient's family history includes Hypertension in his mother; Other in his father.   ROS:   Please see the history of present illness.    ROS All other systems reviewed and are negative.   PHYSICAL EXAM:   VS:  BP 140/86   Pulse 76   Ht _0  (1.727 m)   Wt 285 lb 12.8 oz (129.6 kg)   BMI 43.46 kg/m    GEN: Well nourished, well developed, in no acute distress  HEENT: normal  Neck: no JVD, carotid bruits, or masses Cardiac: RRR; no murmurs, rubs, or gallops,no edema  Respiratory:  clear to auscultation bilaterally, normal work of breathing GI: soft, nontender, nondistended, + BS, obese MS: no deformity or atrophy  Skin: warm and dry, no rash Neuro:  Alert and Oriented x 3, Strength and sensation are intact Psych: euthymic mood, full affect  Wt Readings from Last 3 Encounters:  06/01/16 285 lb 12.8 oz (129.6 kg)  05/08/16 281 lb 12.8 oz (127.8 kg)  04/24/16 284 lb 6.4 oz (129 kg)      Studies/Labs Reviewed:   EKG:  EKG from 05/08/16 shows sinus rhythm with PACs, sinus arrhythmia. Personally viewed.  Recent Labs: 03/06/2016: B Natriuretic Peptide 129.7 03/10/2016: ALT 40; BUN 15; Creatinine, Ser 0.71; Hemoglobin 11.9; Magnesium 2.5; Platelets 282; Potassium 3.7; Sodium 138   Lipid Panel No results found for: CHOL, TRIG, HDL, CHOLHDL, VLDL, LDLCALC, LDLDIRECT  Additional studies/ records that were reviewed today include:   ECHO 04/26/16: - Left ventricle: The cavity  size was mildly dilated. Wall   thickness was normal. Systolic function was mildly to moderately   reduced. The estimated ejection fraction was in the range of 40%   to 45%. Abnormal GLPSS at -14%. Diffuse hypokinesis. Doppler   parameters are consistent with abnormal left ventricular   relaxation (grade 1 diastolic dysfunction). The E/e&' ratio is   between 8-15, suggesting indeterminate LV Filling pressure. - Mitral valve: Mildly thickened leaflets . There was trivial   regurgitation. - Left atrium: The atrium was normal in size. - Right atrium: Moderately dilated. -  Tricuspid valve: There was moderate regurgitation. - Pulmonary arteries: PA peak pressure: 42 mm Hg (S). - Inferior vena cava: The vessel was dilated. The respirophasic   diameter changes were blunted (< 50%), consistent with elevated   central venous pressure. - Pericardium, extracardiac: There was no pericardial effusion.  Impressions:  - LVEF 40-45%, mildly dilated LV with global hypokinesis, diastolic   dysfunction and elevated LV filling pressure, trivial MR, normal   LA size, moderate RAE, moderate TR, RVSP 42 mmHg, dilated IVC, no   pericardial effusion.    ASSESSMENT:    1. Dilated cardiomyopathy (HCC)   2. Paroxysmal atrial fibrillation ([place])   3. Morbid obesity ([place])      PLAN:  In order of problems listed above:  Dilated Cardiomyopathy  - Ejection fraction 40-45% on echocardiogram. This may be related to his atrial fibrillation but could have other causes such as coronary artery disease, hypertension. However, inspection of his CT scan of his chest did not show any evidence of significant coronary atherosclerosis. Since he is not having chest pain, we will continue to treat with carvedilol and ARB. In approximately 6 months we will repeat echocardiogram to assess ejection fraction once again.  - I will increase his carvedilol to 12.5 mg twice a day.  - Continue with losartan.  - He is not having  any significant heart failure type symptoms. NYHA class 1-2.  - He is no longer feeling diaphoretic as he was feeling after his hospitalization with pneumonia and bronchitis and standing up for periods of time sitting in the choir.  - We will check a TSH and free T4.  Paroxysmal atrial fibrillation  - Currently in sinus rhythm.  - Continue with carvedilol. We will stop aspirin. He is on Coumadin because of potential drug interactions with phenobarbital with new agents.  Morbid obesity  - Strongly encourage weight loss. This will be vital for his overall cardiovascular health and well-being.  Aortic atherosclerosis  - Seen on CT scan. Continue with secondary prevention.  Medication Adjustments/Labs and Tests Ordered: Current medicines are reviewed at length with the patient today.  Concerns regarding medicines are outlined above.  Medication changes, Labs and Tests ordered today are listed in the Patient Instructions below. Patient Instructions  Medication Instructions:  You may stop your ASA. Please take Carvedilol 12.5 mg twice a day. Continue all other medications as listed.  Please have blood work today (Free T4 and TSH)  Follow-Up: Follow up in 1 month with Bonney Leitz, PA.  If you need a refill on your cardiac medications before your next appointment, please call your pharmacy.  Thank you for choosing [facility]!!        Signed, [name], MD  06/01/2016 1:20 PM    [facility] [place], [place], [place]  [address] Phone: [phone]; Fax: [phone]

## 2016-06-01 NOTE — Patient Instructions (Addendum)
Medication Instructions:  You may stop your ASA. Please take Carvedilol 12.5 mg twice a day. Continue all other medications as listed.  Please have blood work today (Free T4 and TSH)  Follow-Up: Follow up in 1 month with Bonney Leitz, PA.  If you need a refill on your cardiac medications before your next appointment, please call your pharmacy.  Thank you for choosing [facility]!!

## 2016-06-02 LAB — T4, FREE: FREE T4: 1.08 ng/dL (ref 0.82–1.77)

## 2016-06-02 LAB — TSH: TSH: 1.87 u[IU]/mL (ref 0.450–4.500)

## 2016-06-05 DIAGNOSIS — R972 Elevated prostate specific antigen [PSA]: Secondary | ICD-10-CM | POA: Diagnosis not present

## 2016-06-14 DIAGNOSIS — Z7901 Long term (current) use of anticoagulants: Secondary | ICD-10-CM | POA: Diagnosis not present

## 2016-06-14 DIAGNOSIS — I4891 Unspecified atrial fibrillation: Secondary | ICD-10-CM | POA: Diagnosis not present

## 2016-06-28 DIAGNOSIS — R3915 Urgency of urination: Secondary | ICD-10-CM | POA: Diagnosis not present

## 2016-06-28 DIAGNOSIS — R972 Elevated prostate specific antigen [PSA]: Secondary | ICD-10-CM | POA: Diagnosis not present

## 2016-06-28 DIAGNOSIS — N401 Enlarged prostate with lower urinary tract symptoms: Secondary | ICD-10-CM | POA: Diagnosis not present

## 2016-07-04 NOTE — Progress Notes (Signed)
Cardiology Office Note    Date:  07/05/2016   ID:  Ruben Murray, DOB 01-23-1946, MRN [id number]  PCP:  Vena Austria, MD  Cardiologist:  Dr. [name]  CC: 1 month follow up   History of Present Illness:  Ruben Murray is a 71 y.o. male with a history of morbid obesity, chronic systolic CHF, PAF, seizures on phenobarbital and HTN who presents to clinic for follow up.   He was admitted to [facility] in 03/2016 for PNA. Shortly after that, he was diagnosed with atrial fibrillation.   He has been followed in the afib clinic for PAF. He was placed on diltizem and coumadin ( given interaction of DOACs with phenobarbital.) 2D ECHO 04/26/16 showed EF of 40-45% and moderate RAE, RVSP 42 mm, mod TR.  He was referred to Dr. [name] with general cardiology for further work up. His diltiazem was swtiched to Coreg given LV dysfunction. He was continued on Losartan. LV dysfunction was felt to be possibly tachy mediated. Plan was for repeat 2D ECHO in 6 months. He was not felt to have any significant heart failures symptoms. He has been seen by Dr. Maxwell Caul and had a sleep study done. He does not have significant OSA while sleeping in the recliner and Dr. Maxwell Caul did not think that a CPAP would make a big difference (I reviewed records faxed from their office).   Today he presents to clinic for follow up. No CP but chronic exertion SOB. No LE edema, orthopnea (sleeps in a recliner due to back pain) or PND. No dizziness or syncope. No blood in stool or urine. No palpitations. He can't tell when he is afib. He has had a worsening cough that has not cleared up since jan when he PNA.    He used to smoke 3 packs of cigarettes x 20 years. Father died of cardiac arrest in 73s.   Past Medical History:  Diagnosis Date  . CAP (community acquired pneumonia) 03/06/2016  . Hyperlipidemia   . Hypertension   . Myocardial infarction [facility]) 1980-early 2000s X 2   "mild ones" (03/06/2016)  . OSA on CPAP    "not wearing  it right now; need to replace parts" (03/06/2016)  . Pneumonia 02/2016  . Pneumonia    "when I was a kid" (03/06/2016)  . Seizures ([place])    "take RX daily" (03/06/2016)    Past Surgical History:  Procedure Laterality Date  . CIRCUMCISION    . JOINT REPLACEMENT    . PERCUTANEOUS PINNING TOE FRACTURE Left    "big toe  . REPLACEMENT TOTAL KNEE Left   . TONSILLECTOMY AND ADENOIDECTOMY      Current Medications: Outpatient Medications Prior to Visit  Medication Sig Dispense Refill  . acetaminophen (TYLENOL) 500 MG tablet Take 1,000 mg by mouth every 6 (six) hours as needed (Pain).    . carvedilol (COREG) 12.5 MG tablet Take 1 tablet (12.5 mg total) by mouth 2 (two) times daily with a meal. 60 tablet 6  . losartan-hydrochlorothiazide (HYZAAR) 100-12.5 MG per tablet Take 1 tablet by mouth every morning.    . Multiple Vitamin (MULITIVITAMIN WITH MINERALS) TABS Take 1 tablet by mouth every morning.    Marland Kitchen PHENobarbital (LUMINAL) 97.2 MG tablet Take 97.2 mg by mouth every morning.    . polyethylene glycol (MIRALAX / GLYCOLAX) packet Take 17 g by mouth daily as needed. 14 each 0  . pravastatin (PRAVACHOL) 40 MG tablet Take 40 mg by mouth daily.    Marland Kitchen  tamsulosin (FLOMAX) 0.4 MG CAPS capsule Take 1 capsule (0.4 mg total) by mouth daily. 30 capsule 1  . warfarin (COUMADIN) 1 MG tablet Take 1 tablet (1 mg total) by mouth daily. Take along with the 70m tablet to make 668mtotal daily 30 tablet 3  . warfarin (COUMADIN) 5 MG tablet Take 1 tablet (5 mg total) by mouth daily. 30 tablet 0   No facility-administered medications prior to visit.      Allergies:   Dilaudid [hydromorphone hcl]; Morphine and related; and Tegretol [carbamazepine]   Social History   Social History  . Marital status: Married    Spouse name: N/A  . Number of children: N/A  . Years of education: N/A   Social History Main Topics  . Smoking status: Former Smoker    Packs/day: 3.00    Years: 48.00    Types: Cigarettes    Quit  date: 2000  . Smokeless tobacco: Never Used  . Alcohol use No  . Drug use: No  . Sexual activity: No   Other Topics Concern  . None   Social History Narrative  . None     Family History:  The patient's family history includes Hypertension in his mother; Other in his father.      ROS:   Please see the history of present illness.    ROS All other systems reviewed and are negative.   PHYSICAL EXAM:   VS:  BP 118/70   Pulse 80   Ht _0  (1.727 m)   Wt 287 lb 6.4 oz (130.4 kg)   BMI 43.70 kg/m    GEN: Well nourished, well developed, in no acute distress, obese HEENT: normal  Neck: no JVD, carotid bruits, or masses Cardiac: RRR; no murmurs, rubs, or gallops,no edema  Respiratory:  clear to auscultation bilaterally, normal work of breathing GI: soft, nontender, nondistended, + BS MS: no deformity or atrophy  Skin: warm and dry, no rash Neuro:  Alert and Oriented x 3, Strength and sensation are intact Psych: euthymic mood, full affect    Wt Readings from Last 3 Encounters:  07/05/16 287 lb 6.4 oz (130.4 kg)  06/01/16 285 lb 12.8 oz (129.6 kg)  05/08/16 281 lb 12.8 oz (127.8 kg)      Studies/Labs Reviewed:   EKG:  EKG is ordered today.  The ekg ordered today demonstrates sinus with PACs HR 80, RBBB  Recent Labs: 03/06/2016: B Natriuretic Peptide 129.7 03/10/2016: ALT 40; BUN 15; Creatinine, Ser 0.71; Hemoglobin 11.9; Magnesium 2.5; Platelets 282; Potassium 3.7; Sodium 138 06/01/2016: TSH 1.870   Lipid Panel No results found for: CHOL, TRIG, HDL, CHOLHDL, VLDL, LDLCALC, LDLDIRECT  Additional studies/ records that were reviewed today include:  2D ECHO: 04/26/2016 LV EF: 40% -   45% Study Conclusions - Left ventricle: The cavity size was mildly dilated. Wall   thickness was normal. Systolic function was mildly to moderately   reduced. The estimated ejection fraction was in the range of 40%   to 45%. Abnormal GLPSS at -14%. Diffuse hypokinesis. Doppler   parameters  are consistent with abnormal left ventricular   relaxation (grade 1 diastolic dysfunction). The E/e&' ratio is   between 8-15, suggesting indeterminate LV Filling pressure. - Mitral valve: Mildly thickened leaflets . There was trivial   regurgitation. - Left atrium: The atrium was normal in size. - Right atrium: Moderately dilated. - Tricuspid valve: There was moderate regurgitation. - Pulmonary arteries: PA peak pressure: 42 mm Hg (S). - Inferior vena  cava: The vessel was dilated. The respirophasic   diameter changes were blunted (< 50%), consistent with elevated   central venous pressure. - Pericardium, extracardiac: There was no pericardial effusion. Impressions: - LVEF 40-45%, mildly dilated LV with global hypokinesis, diastolic   dysfunction and elevated LV filling pressure, trivial MR, normal   LA size, moderate RAE, moderate TR, RVSP 42 mmHg, dilated IVC, no   pericardial effusion.   ASSESSMENT & PLAN:   Dilated cardiomyopathy: EF down to 40-45%. Felt to possibly be tachy-mediated. He has had an ongoing cough and worsening DOE. He appears euvolemic, but exam difficult given body habitus. I will check a BNP and CXR to r/o occult CHF. If those are normal, I will order a lexiscan myoview given worsening dyspnea on exertion, LV dysfunction and RFs for CAD. Continue Losartan-HCT 100-12.61m daily and Coreg 12.572mBID.   PAF: maintaining NSR. CHADSVASC at least 4 (CHF, HTN, Age, vasc dz). Continue coumadin (cannot use DOACs given drug/drug interaction with phenobarbital used for seizures). Continue Coreg 12.72m61mID.  Morbid obesity: Body mass index is 43.7 kg/m. I have discussed the importance of diet and exercise.   OSA: recent sleep study only showed mild OSA while sleeping in a recliner. Dr OsbMaxwell Caullt that CPAP therapy would likely be unhelpful  HTN: BP well controlled today   Medication Adjustments/Labs and Tests Ordered: Current medicines are reviewed at length with the  patient today.  Concerns regarding medicines are outlined above.  Medication changes, Labs and Tests ordered today are listed in the Patient Instructions below. Patient Instructions  Medication Instructions:  Your physician recommends that you continue on your current medications as directed. Please refer to the Current Medication list given to you today.   Labwork: TODAY:  PRO BNP  Testing/Procedures: A chest x-ray takes a picture of the organs and structures inside the chest, including the heart, lungs, and blood vessels. This test can show several things, including, whether the heart is enlarges; whether fluid is building up in the lungs; and whether pacemaker / defibrillator leads are still in place.  Follow-Up: Your physician recommends that you schedule a follow-up appointment in: 3 MWestfirA-C OR DR. SKAMarlou PorchAny Other Special Instructions Will Be Listed Below (If Applicable).  Pharmacologic Stress Electrocardiogram Introduction A pharmacologic stress electrocardiogram is a heart (cardiac) test that uses nuclear imaging to evaluate the blood supply to your heart. This test may also be called a pharmacologic stress electrocardiography. Pharmacologic means that a medicine is used to increase your heart rate and blood pressure. This stress test is done to find areas of poor blood flow to the heart by determining the extent of coronary artery disease (CAD). Some people exercise on a treadmill, which naturally increases the blood flow to the heart. For those people unable to exercise on a treadmill, a medicine is used. This medicine stimulates your heart and will cause your heart to beat harder and more quickly, as if you were exercising. Pharmacologic stress tests can help determine:  The adequacy of blood flow to your heart during increased levels of activity in order to clear you for discharge home.  The extent of coronary artery blockage caused by CAD.  Your  prognosis if you have suffered a heart attack.  The effectiveness of cardiac procedures done, such as an angioplasty, which can increase the circulation in your coronary arteries.  Causes of chest pain or pressure. LET YOUBoston Eye Surgery And Laser CenterRE PROVIDER KNOW ABOUT:  Any allergies you  have.  All medicines you are taking, including vitamins, herbs, eye drops, creams, and over-the-counter medicines.  Previous problems you or members of your family have had with the use of anesthetics.  Any blood disorders you have.  Previous surgeries you have had.  Medical conditions you have.  Possibility of pregnancy, if this applies.  If you are currently breastfeeding. RISKS AND COMPLICATIONS Generally, this is a safe procedure. However, as with any procedure, complications can occur. Possible complications include:  You develop pain or pressure in the following areas:  Chest.  Jaw or neck.  Between your shoulder blades.  Radiating down your left arm.  Headache.  Dizziness or light-headedness.  Shortness of breath.  Increased or irregular heartbeat.  Low blood pressure.  Nausea or vomiting.  Flushing.  Redness going up the arm and slight pain during injection of medicine.  Heart attack (rare). BEFORE THE PROCEDURE  Avoid all forms of caffeine for 24 hours before your test or as directed by your health care provider. This includes coffee, tea (even decaffeinated tea), caffeinated sodas, chocolate, cocoa, and certain pain medicines.  Follow your health care provider's instructions regarding eating and drinking before the test.  Take your medicines as directed at regular times with water unless instructed otherwise. Exceptions may include:  If you have diabetes, ask how you are to take your insulin or pills. It is common to adjust insulin dosing the morning of the test.  If you are taking beta-blocker medicines, it is important to talk to your health care provider about these  medicines well before the date of your test. Taking beta-blocker medicines may interfere with the test. In some cases, these medicines need to be changed or stopped 24 hours or more before the test.  If you wear a nitroglycerin patch, it may need to be removed prior to the test. Ask your health care provider if the patch should be removed before the test.  If you use an inhaler for any breathing condition, bring it with you to the test.  If you are an outpatient, bring a snack so you can eat right after the stress phase of the test.  Do not smoke for 4 hours prior to the test or as directed by your health care provider.  Do not apply lotions, powders, creams, or oils on your chest prior to the test.  Wear comfortable shoes and clothing. Let your health care provider know if you were unable to complete or follow the preparations for your test. PROCEDURE  Multiple patches (electrodes) will be put on your chest. If needed, small areas of your chest may be shaved to get better contact with the electrodes. Once the electrodes are attached to your body, multiple wires will be attached to the electrodes, and your heart rate will be monitored.  An IV access will be started. A nuclear trace (isotope) is given. The isotope may be given intravenously, or it may be swallowed. Nuclear refers to several types of radioactive isotopes, and the nuclear isotope lights up the arteries so that the nuclear images are clear. The isotope is absorbed by your body. This results in low radiation exposure.  A resting nuclear image is taken to show how your heart functions at rest.  A medicine is given through the IV access.  A second scan is done about 1 hour after the medicine injection and determines how your heart functions under stress.  During this stress phase, you will be connected to an electrocardiogram machine. Your  blood pressure and oxygen levels will be monitored. What to expect after the  procedure  Your heart rate and blood pressure will be monitored after the test.  You may return to your normal schedule, including diet,activities, and medicines, unless your health care provider tells you otherwise. This information is not intended to replace advice given to you by your health care provider. Make sure you discuss any questions you have with your health care provider. Document Released: 07/09/2008 Document Revised: 07/29/2015 Document Reviewed: 08/30/2015 Elsevier Interactive Patient Education  2017 [employer].    If you need a refill on your cardiac medications before your next appointment, please call your pharmacy.      Mable Fill, PA-C  07/05/2016 11:09 AM    [facility] [place], [place], [place]  [address] Phone: [phone]; Fax: [phone]

## 2016-07-05 ENCOUNTER — Ambulatory Visit (INDEPENDENT_AMBULATORY_CARE_PROVIDER_SITE_OTHER): Payer: Medicare HMO | Admitting: Physician Assistant

## 2016-07-05 ENCOUNTER — Encounter: Payer: Self-pay | Admitting: Physician Assistant

## 2016-07-05 ENCOUNTER — Ambulatory Visit
Admission: RE | Admit: 2016-07-05 | Discharge: 2016-07-05 | Disposition: A | Discharge status: Home / Self Care | Payer: Medicare HMO | Source: Ambulatory Visit | Attending: Physician Assistant | Admitting: Physician Assistant

## 2016-07-05 ENCOUNTER — Encounter (INDEPENDENT_AMBULATORY_CARE_PROVIDER_SITE_OTHER): Payer: Self-pay

## 2016-07-05 VITALS — BP 118/70 | HR 80 | Ht 68.0 in | Wt 287.4 lb

## 2016-07-05 DIAGNOSIS — R05 Cough: Secondary | ICD-10-CM | POA: Diagnosis not present

## 2016-07-05 DIAGNOSIS — Z7901 Long term (current) use of anticoagulants: Secondary | ICD-10-CM | POA: Diagnosis not present

## 2016-07-05 DIAGNOSIS — R059 Cough, unspecified: Secondary | ICD-10-CM

## 2016-07-05 DIAGNOSIS — I420 Dilated cardiomyopathy: Secondary | ICD-10-CM

## 2016-07-05 DIAGNOSIS — I10 Essential (primary) hypertension: Secondary | ICD-10-CM | POA: Diagnosis not present

## 2016-07-05 DIAGNOSIS — I4891 Unspecified atrial fibrillation: Secondary | ICD-10-CM | POA: Diagnosis not present

## 2016-07-05 DIAGNOSIS — R0609 Other forms of dyspnea: Secondary | ICD-10-CM

## 2016-07-05 DIAGNOSIS — I480 Paroxysmal atrial fibrillation: Secondary | ICD-10-CM | POA: Diagnosis not present

## 2016-07-05 DIAGNOSIS — R0602 Shortness of breath: Secondary | ICD-10-CM

## 2016-07-05 NOTE — Patient Instructions (Addendum)
Medication Instructions:  Your physician recommends that you continue on your current medications as directed. Please refer to the Current Medication list given to you today.   Labwork: TODAY:  PRO BNP  Testing/Procedures: A chest x-ray takes a picture of the organs and structures inside the chest, including the heart, lungs, and blood vessels. This test can show several things, including, whether the heart is enlarges; whether fluid is building up in the lungs; and whether pacemaker / defibrillator leads are still in place.  Follow-Up: Your physician recommends that you schedule a follow-up appointment in: [place], PA-C OR DR. [name]   Any Other Special Instructions Will Be Listed Below (If Applicable).  Pharmacologic Stress Electrocardiogram Introduction A pharmacologic stress electrocardiogram is a heart (cardiac) test that uses nuclear imaging to evaluate the blood supply to your heart. This test may also be called a pharmacologic stress electrocardiography. Pharmacologic means that a medicine is used to increase your heart rate and blood pressure. This stress test is done to find areas of poor blood flow to the heart by determining the extent of coronary artery disease (CAD). Some people exercise on a treadmill, which naturally increases the blood flow to the heart. For those people unable to exercise on a treadmill, a medicine is used. This medicine stimulates your heart and will cause your heart to beat harder and more quickly, as if you were exercising. Pharmacologic stress tests can help determine:  The adequacy of blood flow to your heart during increased levels of activity in order to clear you for discharge home.  The extent of coronary artery blockage caused by CAD.  Your prognosis if you have suffered a heart attack.  The effectiveness of cardiac procedures done, such as an angioplasty, which can increase the circulation in your coronary  arteries.  Causes of chest pain or pressure. LET [facility] CARE PROVIDER KNOW ABOUT:  Any allergies you have.  All medicines you are taking, including vitamins, herbs, eye drops, creams, and over-the-counter medicines.  Previous problems you or members of your family have had with the use of anesthetics.  Any blood disorders you have.  Previous surgeries you have had.  Medical conditions you have.  Possibility of pregnancy, if this applies.  If you are currently breastfeeding. RISKS AND COMPLICATIONS Generally, this is a safe procedure. However, as with any procedure, complications can occur. Possible complications include:  You develop pain or pressure in the following areas:  Chest.  Jaw or neck.  Between your shoulder blades.  Radiating down your left arm.  Headache.  Dizziness or light-headedness.  Shortness of breath.  Increased or irregular heartbeat.  Low blood pressure.  Nausea or vomiting.  Flushing.  Redness going up the arm and slight pain during injection of medicine.  Heart attack (rare). BEFORE THE PROCEDURE  Avoid all forms of caffeine for 24 hours before your test or as directed by your health care provider. This includes coffee, tea (even decaffeinated tea), caffeinated sodas, chocolate, cocoa, and certain pain medicines.  Follow your health care provider's instructions regarding eating and drinking before the test.  Take your medicines as directed at regular times with water unless instructed otherwise. Exceptions may include:  If you have diabetes, ask how you are to take your insulin or pills. It is common to adjust insulin dosing the morning of the test.  If you are taking beta-blocker medicines, it is important to talk to your health care provider about these medicines well before the  date of your test. Taking beta-blocker medicines may interfere with the test. In some cases, these medicines need to be changed or stopped 24 hours or  more before the test.  If you wear a nitroglycerin patch, it may need to be removed prior to the test. Ask your health care provider if the patch should be removed before the test.  If you use an inhaler for any breathing condition, bring it with you to the test.  If you are an outpatient, bring a snack so you can eat right after the stress phase of the test.  Do not smoke for 4 hours prior to the test or as directed by your health care provider.  Do not apply lotions, powders, creams, or oils on your chest prior to the test.  Wear comfortable shoes and clothing. Let your health care provider know if you were unable to complete or follow the preparations for your test. PROCEDURE  Multiple patches (electrodes) will be put on your chest. If needed, small areas of your chest may be shaved to get better contact with the electrodes. Once the electrodes are attached to your body, multiple wires will be attached to the electrodes, and your heart rate will be monitored.  An IV access will be started. A nuclear trace (isotope) is given. The isotope may be given intravenously, or it may be swallowed. Nuclear refers to several types of radioactive isotopes, and the nuclear isotope lights up the arteries so that the nuclear images are clear. The isotope is absorbed by your body. This results in low radiation exposure.  A resting nuclear image is taken to show how your heart functions at rest.  A medicine is given through the IV access.  A second scan is done about 1 hour after the medicine injection and determines how your heart functions under stress.  During this stress phase, you will be connected to an electrocardiogram machine. Your blood pressure and oxygen levels will be monitored. What to expect after the procedure  Your heart rate and blood pressure will be monitored after the test.  You may return to your normal schedule, including diet,activities, and medicines, unless your health care  provider tells you otherwise. This information is not intended to replace advice given to you by your health care provider. Make sure you discuss any questions you have with your health care provider. Document Released: 07/09/2008 Document Revised: 07/29/2015 Document Reviewed: 08/30/2015 Elsevier Interactive Patient Education  2017 [employer].    If you need a refill on your cardiac medications before your next appointment, please call your pharmacy.

## 2016-07-06 ENCOUNTER — Telehealth: Payer: Self-pay | Admitting: *Deleted

## 2016-07-06 DIAGNOSIS — R0602 Shortness of breath: Secondary | ICD-10-CM

## 2016-07-06 LAB — PRO B NATRIURETIC PEPTIDE: NT-Pro BNP: 38 pg/mL (ref 0–376)

## 2016-07-06 NOTE — Telephone Encounter (Signed)
-----  Message from Eileen Stanford, PA-C sent at 07/06/2016  8:08 AM EDT ----- BNP is totally normal and CXR shows no fluid, so likely not volume overloaded. His CXR did show some hyperinflation of lungs that is consistent with COPD. With his worsening shortness of breath, lets get the stress test (lexiscan myoview). Also I would like him to get pulmonary function tests as he has never been formally diagnosed with COPD.

## 2016-07-10 ENCOUNTER — Ambulatory Visit (INDEPENDENT_AMBULATORY_CARE_PROVIDER_SITE_OTHER): Payer: Medicare HMO | Admitting: Internal Medicine

## 2016-07-10 DIAGNOSIS — R0602 Shortness of breath: Secondary | ICD-10-CM | POA: Diagnosis not present

## 2016-07-10 LAB — PULMONARY FUNCTION TEST
FEF 25-75 Post: 1.88 L/sec
FEF 25-75 Pre: 1.23 L/sec
FEF2575-%CHANGE-POST: 53 %
FEF2575-%Pred-Post: 90 %
FEF2575-%Pred-Pre: 58 %
FEV1-%CHANGE-POST: 19 %
FEV1-%Pred-Post: 70 %
FEV1-%Pred-Pre: 58 %
FEV1-PRE: 1.66 L
FEV1-Post: 1.98 L
FEV1FVC-%Change-Post: -11 %
FEV1FVC-%PRED-PRE: 98 %
FEV6-%Change-Post: 34 %
FEV6-%PRED-POST: 84 %
FEV6-%Pred-Pre: 63 %
FEV6-Post: 3.09 L
FEV6-Pre: 2.3 L
FEV6FVC-%Change-Post: 0 %
FEV6FVC-%PRED-PRE: 106 %
FEV6FVC-%Pred-Post: 106 %
FVC-%Change-Post: 34 %
FVC-%Pred-Post: 79 %
FVC-%Pred-Pre: 59 %
FVC-Post: 3.1 L
FVC-Pre: 2.3 L
POST FEV1/FVC RATIO: 64 %
POST FEV6/FVC RATIO: 100 %
Pre FEV1/FVC ratio: 72 %
Pre FEV6/FVC Ratio: 100 %

## 2016-07-10 NOTE — Progress Notes (Signed)
PFT done today.

## 2016-07-12 ENCOUNTER — Telehealth: Payer: Self-pay | Admitting: Physician Assistant

## 2016-07-12 DIAGNOSIS — J449 Chronic obstructive pulmonary disease, unspecified: Secondary | ICD-10-CM

## 2016-07-12 NOTE — Telephone Encounter (Signed)
New message    Pt wife is returning call to [place] about results.

## 2016-07-12 NOTE — Telephone Encounter (Signed)
-----  Message from Eileen Stanford, PA-C sent at 07/12/2016 10:07 AM EDT ----- PFTs show moderate to severe obstructive airway dz that improved with bronchodilators. We can refer to pulm if they would like

## 2016-07-22 ENCOUNTER — Emergency Department (HOSPITAL_COMMUNITY)
Admission: EM | Admit: 2016-07-22 | Discharge: 2016-07-22 | Disposition: A | Discharge status: Home / Self Care | Payer: Medicare HMO | Attending: Emergency Medicine | Admitting: Emergency Medicine

## 2016-07-22 ENCOUNTER — Encounter (HOSPITAL_COMMUNITY): Payer: Self-pay | Admitting: Emergency Medicine

## 2016-07-22 ENCOUNTER — Emergency Department (HOSPITAL_COMMUNITY): Payer: Medicare HMO

## 2016-07-22 DIAGNOSIS — I252 Old myocardial infarction: Secondary | ICD-10-CM | POA: Diagnosis not present

## 2016-07-22 DIAGNOSIS — Z96652 Presence of left artificial knee joint: Secondary | ICD-10-CM | POA: Insufficient documentation

## 2016-07-22 DIAGNOSIS — R05 Cough: Secondary | ICD-10-CM | POA: Diagnosis not present

## 2016-07-22 DIAGNOSIS — Z87891 Personal history of nicotine dependence: Secondary | ICD-10-CM | POA: Insufficient documentation

## 2016-07-22 DIAGNOSIS — Z7901 Long term (current) use of anticoagulants: Secondary | ICD-10-CM | POA: Insufficient documentation

## 2016-07-22 DIAGNOSIS — I10 Essential (primary) hypertension: Secondary | ICD-10-CM | POA: Diagnosis not present

## 2016-07-22 DIAGNOSIS — R079 Chest pain, unspecified: Secondary | ICD-10-CM | POA: Diagnosis not present

## 2016-07-22 DIAGNOSIS — R059 Cough, unspecified: Secondary | ICD-10-CM

## 2016-07-22 DIAGNOSIS — K5900 Constipation, unspecified: Secondary | ICD-10-CM | POA: Insufficient documentation

## 2016-07-22 DIAGNOSIS — R0602 Shortness of breath: Secondary | ICD-10-CM | POA: Diagnosis not present

## 2016-07-22 DIAGNOSIS — Z79899 Other long term (current) drug therapy: Secondary | ICD-10-CM | POA: Insufficient documentation

## 2016-07-22 DIAGNOSIS — R0789 Other chest pain: Secondary | ICD-10-CM | POA: Diagnosis not present

## 2016-07-22 LAB — HEPATIC FUNCTION PANEL
ALBUMIN: 3.7 g/dL (ref 3.5–5.0)
ALK PHOS: 102 U/L (ref 38–126)
ALT: 20 U/L (ref 17–63)
AST: 25 U/L (ref 15–41)
BILIRUBIN DIRECT: 0.3 mg/dL (ref 0.1–0.5)
BILIRUBIN TOTAL: 0.6 mg/dL (ref 0.3–1.2)
Indirect Bilirubin: 0.3 mg/dL (ref 0.3–0.9)
Total Protein: 6.7 g/dL (ref 6.5–8.1)

## 2016-07-22 LAB — CBC WITH DIFFERENTIAL/PLATELET
BASOS ABS: 0 10*3/uL (ref 0.0–0.1)
Basophils Relative: 0 %
EOS PCT: 3 %
Eosinophils Absolute: 0.2 10*3/uL (ref 0.0–0.7)
HCT: 42.7 % (ref 39.0–52.0)
HEMOGLOBIN: 13.7 g/dL (ref 13.0–17.0)
LYMPHS ABS: 3 10*3/uL (ref 0.7–4.0)
LYMPHS PCT: 37 %
MCH: 29.5 pg (ref 26.0–34.0)
MCHC: 32.1 g/dL (ref 30.0–36.0)
MCV: 92 fL (ref 78.0–100.0)
Monocytes Absolute: 0.6 10*3/uL (ref 0.1–1.0)
Monocytes Relative: 7 %
NEUTROS ABS: 4.4 10*3/uL (ref 1.7–7.7)
NEUTROS PCT: 53 %
Platelets: 200 10*3/uL (ref 150–400)
RBC: 4.64 MIL/uL (ref 4.22–5.81)
RDW: 13.3 % (ref 11.5–15.5)
WBC: 8.3 10*3/uL (ref 4.0–10.5)

## 2016-07-22 LAB — I-STAT TROPONIN, ED: Troponin i, poc: 0 ng/mL (ref 0.00–0.08)

## 2016-07-22 LAB — PROTIME-INR
INR: 2.78
PROTHROMBIN TIME: 29.9 s — AB (ref 11.4–15.2)

## 2016-07-22 LAB — LIPASE, BLOOD: LIPASE: 27 U/L (ref 11–51)

## 2016-07-22 MED ORDER — HYDROCODONE-ACETAMINOPHEN 5-325 MG PO TABS: 1.0000 | ORAL_TABLET | ORAL | 0 refills | Status: DC | PRN

## 2016-07-22 MED ORDER — PANTOPRAZOLE SODIUM 40 MG IV SOLR
40.0000 mg | Freq: Once | INTRAVENOUS | Status: AC
  Administered 2016-07-22: 40 mg via INTRAVENOUS
  Filled 2016-07-22: qty 40

## 2016-07-22 MED ORDER — POLYETHYLENE GLYCOL 3350 17 G PO PACK: 17.0000 g | PACK | Freq: Every day | ORAL | 0 refills | Status: DC

## 2016-07-22 MED ORDER — DOCUSATE SODIUM 100 MG PO CAPS: 100.0000 mg | ORAL_CAPSULE | Freq: Two times a day (BID) | ORAL | 0 refills | Status: DC

## 2016-07-22 MED ORDER — HYDROCODONE-ACETAMINOPHEN 5-325 MG PO TABS
2.0000 | ORAL_TABLET | Freq: Once | ORAL | Status: AC
  Administered 2016-07-22: 2 via ORAL
  Filled 2016-07-22: qty 2

## 2016-07-22 NOTE — ED Provider Notes (Signed)
[place] DEPT Provider Note   CSN: [id number] Arrival date & time: 07/22/16  1414     History   Chief Complaint Chief Complaint  Patient presents with  . Chest Pain  . Dizziness    HPI Ruben Murray is a 71 y.o. male.  HPI Patient reports that he always sleeps sitting in a recliner. This is due to chronic shortness of breath. He reports yesterday evening about 1 AM he developed chest pain that was sharp and very intense. He reports it's on the left lower side of his chest. It's improved significantly by lying semi-recumbent and much worse by sitting upright. He did not experience significant increase in shortness of breath he has not had fever. HEENT his wife do report he started coughing yesterday. No nausea or vomiting. He chronically has some lower extremity swelling. Particularly in the right ankle due to reportedly gait asymmetry from his prior left knee replacement. No acute swelling or calf pain.  Patient review systems reports that he has had decreased frequency of bowel movement and has to strain more. His wife has been giving him Colace which she has been taking this week. He reports this is improved in the stool is now softer. Past Medical History:  Diagnosis Date  . CAP (community acquired pneumonia) 03/06/2016  . Hyperlipidemia   . Hypertension   . Myocardial infarction [facility]) 1980-early 2000s X 2   "mild ones" (03/06/2016)  . OSA on CPAP    "not wearing it right now; need to replace parts" (03/06/2016)  . Pneumonia 02/2016  . Pneumonia    "when I was a kid" (03/06/2016)  . Seizures ([place])    "take RX daily" (03/06/2016)    Patient Active Problem List   Diagnosis Date Noted  . CAP (community acquired pneumonia) 03/07/2016  . Community acquired pneumonia 03/06/2016  . Hypertension 03/06/2016  . Hyperlipidemia 03/06/2016  . Right thyroid nodule 03/06/2016  . Seizures ([place]) 03/06/2016  . Fever 03/12/2015  . 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 CIRCUMCISION    . JOINT REPLACEMENT    . PERCUTANEOUS PINNING TOE FRACTURE Left    "big toe  . REPLACEMENT TOTAL KNEE Left   . TONSILLECTOMY AND ADENOIDECTOMY         Home Medications    Prior to Admission medications   Medication Sig Start Date End Date Taking? Authorizing Provider  acetaminophen (TYLENOL) 500 MG tablet Take 1,000 mg by mouth every 6 (six) hours as needed (Pain).    [provider]  carvedilol (COREG) 12.5 MG tablet Take 1 tablet (12.5 mg total) by mouth 2 (two) times daily with a meal. 06/01/16   [name], MD  losartan-hydrochlorothiazide (HYZAAR) 100-12.5 MG per tablet Take 1 tablet by mouth every morning.    [provider]  Multiple Vitamin (MULITIVITAMIN WITH MINERALS) TABS Take 1 tablet by mouth every morning.    [provider]  PHENobarbital (LUMINAL) 97.2 MG tablet Take 97.2 mg by mouth every morning.    [provider]  polyethylene glycol (MIRALAX / GLYCOLAX) packet Take 17 g by mouth daily as needed. 03/10/16   Lavina Hamman, MD  pravastatin (PRAVACHOL) 40 MG tablet Take 40 mg by mouth daily.    [provider]  tamsulosin (FLOMAX) 0.4 MG CAPS capsule Take 1 capsule (0.4 mg total) by mouth daily. 03/15/15   Franchot Gallo, MD  warfarin (COUMADIN) 1 MG tablet Take 1  tablet (1 mg total) by mouth daily. Take along with the 28m tablet to make 642mtotal daily 04/24/16   CaSherran NeedsNP  warfarin (COUMADIN) 5 MG tablet Take 1 tablet (5 mg total) by mouth daily. 04/21/16   AlThompson GrayerMD    Family History Family History  Problem Relation Age of Onset  . Hypertension Mother   . Other Father        sudden cardiac arrest    Social History Social History  Substance Use Topics  . Smoking status: Former Smoker    Packs/day: 3.00    Years: 48.00    Types: Cigarettes    Quit date: 2000  . Smokeless tobacco: Never Used  . Alcohol use No      Allergies   Dilaudid [hydromorphone hcl]; Morphine and related; and Tegretol [carbamazepine]   Review of Systems Review of Systems 10 Systems reviewed and are negative for acute change except as noted in the HPI.   Physical Exam Updated Vital Signs BP 131/74   Pulse (!) 58   Temp 98.8 F (37.1 C) (Oral)   Resp 12   Ht _0  (1.727 m)   Wt 287 lb (130.2 kg)   SpO2 (!) 89%   BMI 43.64 kg/m   Physical Exam  Constitutional: He is oriented to person, place, and time.  Patient is alert and nontoxic. He is in no distress. Clinically well and appearance. Central obesity.  HENT:  Head: Normocephalic and atraumatic.  Eyes: Conjunctivae and EOM are normal.  Neck: Neck supple.  Cardiovascular: Normal rate, regular rhythm, normal heart sounds and intact distal pulses.   No murmur heard. Pulmonary/Chest: Effort normal and breath sounds normal. No respiratory distress.  Abdominal: Soft. Bowel sounds are normal. He exhibits no distension. There is tenderness.  Reproducible tenderness in the epigastrium and left upper quadrant. No guarding. Also tender to the left lower chest wall. No rashes or palpable anomaly.  Musculoskeletal: Normal range of motion. He exhibits edema. He exhibits no tenderness.  No calf tenderness to palpation. 1+ pitting edema bilateral lower extremities.  Neurological: He is alert and oriented to person, place, and time. No cranial nerve deficit. He exhibits normal muscle tone. Coordination normal.  Skin: Skin is warm and dry.  Psychiatric: He has a normal mood and affect.  Nursing note and vitals reviewed.    ED Treatments / Results  Labs (all labs ordered are listed, but only abnormal results are displayed) Labs Reviewed  PROTIME-INR - Abnormal; Notable for the following:       Result Value   Prothrombin Time 29.9 (*)    All other components within normal limits  LIPASE, BLOOD  CBC WITH DIFFERENTIAL/PLATELET  HEPATIC FUNCTION PANEL  BASIC  METABOLIC PANEL  CBC  I-STAT TROPOININ, ED  I-STAT TROPOININ, ED    EKG  EKG Interpretation  Date/Time:  Saturday Jul 22 2016 14:21:46 EDT Ventricular Rate:  70 PR Interval:  170 QRS Duration: 140 QT Interval:  412 QTC Calculation: 444 R Axis:   -49 Text Interpretation:  Sinus rhythm with marked sinus arrhythmia Left axis deviation Right bundle branch block Abnormal ECG no sig change from previous Confirmed by PfCharlesetta [phone] 07/22/2016 9:09:25 PM       Radiology Dg Chest 2 View  Result Date: 07/22/2016 CLINICAL DATA:  Shortness of breath and chest pain beginning last night. Pain radiates from left chest tube back and worse with standing. EXAM: CHEST  2 VIEW COMPARISON:  07/05/2016 FINDINGS:  Lungs are adequately inflated without focal consolidation or effusion. Mild stable cardiomegaly. Mild degenerate change of the spine. IMPRESSION: No acute cardiopulmonary disease. Electronically Signed   By: Marin Olp M.D.   On: 07/22/2016 15:16    Procedures Procedures (including critical care time)  Medications Ordered in ED Medications  pantoprazole (PROTONIX) injection 40 mg (40 mg Intravenous Given 07/22/16 1947)  HYDROcodone-acetaminophen (NORCO/VICODIN) 5-325 MG per tablet 2 tablet (2 tablets Oral Given 07/22/16 1945)     Initial Impression / Assessment and Plan / ED Course  I have reviewed the triage vital signs and the nursing notes.  Pertinent labs & imaging results that were available during my care of the patient were reviewed by me and considered in my medical decision making (see chart for details).     Recheck 21:20 patient now feels comfortable. He can sit forward and sit up without significant pain.  Final Clinical Impressions(s) / ED Diagnoses   Final diagnoses:  Atypical chest pain  Cough  Constipation, unspecified constipation type  Chest wall pain   Patient presents for chest pain that occurred last night. He reports that it is reproducible  every time that he moves a certain position or tries to sit forward. Troponin is negative and EKG is not suggestive of acute ischemia there is no change. At this sounds a low probability to be cardiac in etiology. He has had some cough but no fever and no mucus production. Chest x-ray does not show infiltrate he does not have leukocytosis. I do not suspect pneumonia at this time. Patient however may have chest wall strain and abdominal wall strain from coughing episodes. He has reproducible pain to palpation. Pain was significantly alleviated by 2 Vicodin. We'll have the patient continue Colace as he has been and add MiraLAX for control constipation. I will have him use acetaminophen for pain but use Vicodin if needed. Patient counseled on treating constipation. New Prescriptions New Prescriptions   No medications on file     Charlesetta Shanks, MD 07/22/16 2137

## 2016-07-22 NOTE — Discharge Instructions (Signed)
Pain medications such as Vicodin may increase constipation. You may take extra strength Tylenol every 6 hours. If you need additional pain control, you may substitute one of the Tylenols for a Vicodin tablet. In that case, he will take 1 extra strength Tylenol tablet and one Vicodin tablet together. Do not continue to take a normal dose of Tylenol and add the Vicodin as this will give you to much acetaminophen.  Since these medications can be constipating, continue to take the Colace twice a day. Also take MiraLAX daily. Once you are no longer taking pain medications, you may take the Colace daily and only take the MiraLAX every third day if you're not having a bowel movement.

## 2016-07-26 DIAGNOSIS — R0789 Other chest pain: Secondary | ICD-10-CM | POA: Diagnosis not present

## 2016-07-26 DIAGNOSIS — R490 Dysphonia: Secondary | ICD-10-CM | POA: Diagnosis not present

## 2016-07-26 DIAGNOSIS — K5900 Constipation, unspecified: Secondary | ICD-10-CM | POA: Diagnosis not present

## 2016-08-02 ENCOUNTER — Encounter: Payer: Self-pay | Admitting: Internal Medicine

## 2016-08-02 ENCOUNTER — Ambulatory Visit (INDEPENDENT_AMBULATORY_CARE_PROVIDER_SITE_OTHER): Payer: Medicare HMO | Admitting: Internal Medicine

## 2016-08-02 DIAGNOSIS — J4540 Moderate persistent asthma, uncomplicated: Secondary | ICD-10-CM | POA: Insufficient documentation

## 2016-08-02 DIAGNOSIS — J449 Chronic obstructive pulmonary disease, unspecified: Secondary | ICD-10-CM | POA: Diagnosis not present

## 2016-08-02 DIAGNOSIS — I10 Essential (primary) hypertension: Secondary | ICD-10-CM | POA: Diagnosis not present

## 2016-08-02 HISTORY — DX: Chronic obstructive pulmonary disease, unspecified: J44.9

## 2016-08-02 HISTORY — DX: Moderate persistent asthma, uncomplicated: J45.40

## 2016-08-02 HISTORY — DX: Morbid (severe) obesity due to excess calories: E66.01

## 2016-08-02 MED ORDER — FAMOTIDINE 20 MG PO TABS: ORAL_TABLET | ORAL | 2 refills | Status: DC

## 2016-08-02 MED ORDER — BISOPROLOL FUMARATE 5 MG PO TABS: 5.0000 mg | ORAL_TABLET | Freq: Every day | ORAL | 11 refills | Status: DC

## 2016-08-02 MED ORDER — PANTOPRAZOLE SODIUM 40 MG PO TBEC: 40.0000 mg | DELAYED_RELEASE_TABLET | Freq: Every day | ORAL | 2 refills | Status: DC

## 2016-08-02 NOTE — Assessment & Plan Note (Addendum)
Body mass index is 43.79 kg/m.  -  Lab Results  Component Value Date   TSH 1.870 06/01/2016     Contributing to gerd risk/ doe/reviewed the need and the process to achieve and maintain neg calorie balance > defer f/u primary care including intermittently monitoring thyroid status

## 2016-08-02 NOTE — Patient Instructions (Addendum)
Stop corevidol and start bisoprolol 5 mg daily   Pantoprazole (protonix) 40 mg   Take  30-60 min before first meal of the day and Pepcid (famotidine)  20 mg after supper   until return to office - this is the best way to tell whether stomach acid is contributing to your problem.   GERD (REFLUX)  is an extremely common cause of respiratory symptoms just like yours , many times with no obvious heartburn at all.    It can be treated with medication, but also with lifestyle changes including elevation of the head of your bed (ideally with 6 inch  bed blocks),  Smoking cessation, avoidance of late meals, excessive alcohol, and avoid fatty foods, chocolate, peppermint, colas, red wine, and acidic juices such as orange juice.  NO MINT OR MENTHOL PRODUCTS SO NO COUGH DROPS   USE SUGARLESS CANDY INSTEAD (Jolley ranchers or Stover's or Life Savers) or even ice chips will also do - the key is to swallow to prevent all throat clearing. NO OIL BASED VITAMINS - use powdered substitutes.    Please schedule a follow up office visit in 4 weeks, sooner if needed

## 2016-08-02 NOTE — Assessment & Plan Note (Addendum)
Changed from coreg to bisoprolol due to copd with reversible component  08/02/2016 >>>   Strongly prefer in this setting: Bystolic, the most beta -1  selective Beta blocker available in sample form, with bisoprolol the most selective generic choice  on the market.   Try bisoprolol 5 mg daily then return with repeat spirometry

## 2016-08-02 NOTE — Assessment & Plan Note (Addendum)
Quit smoking 2000  PFT's  07/10/2016  FEV1 1.98 (70 % ) ratio 67  p 19 % improvement from saba p nothing  prior to study while of coreg so rec as of 08/02/2016 try off coreg and on bisoprolol and rx for gerd then repeat    When respiratory symptoms begin or become refractory well after a patient reports complete smoking cessation,  Especially when this wasn't the case while they were smoking, a red flag is raised based on the work of Dr Kris Mouton which states:   if you quit smoking when your best day FEV1 is still well preserved it is highly unlikely you will progress to severe disease.  That is to say, once the smoking stops,  the symptoms should not suddenly erupt or markedly worsen.  If so, the differential diagnosis should include  obesity/deconditioning,  LPR/Reflux/Aspiration syndromes,  occult CHF, or  especially side effect of medications commonly used in this population, especially non -specific betablockers which may mimick asthma    Re LPR:  Of the three most common causes of  Sub-acute or recurrent or chronic cough, only one (GERD)  can actually contribute to/ trigger  the other two (asthma and post nasal drip syndrome)  and perpetuate the cylce of cough.  While not intuitively obvious, many patients with chronic low grade reflux do not cough until there is a primary insult that disturbs the protective epithelial barrier and exposes sensitive nerve endings.   This is typically viral as may have been the case here but can be direct physical injury such as with an endotracheal tube.   The point is that once this occurs, it is difficult to eliminate the cycle  using anything but a maximally effective acid suppression regimen at least in the short run, accompanied by an appropriate diet to address non acid GERD and control / eliminate the cough itself for at least 3 days.   rec try off coreg and on max rx for gerd then regroup here in 4 weeks  Total time devoted to counseling  > 50 % of initial  60 min office visit:  review case with pt/ discussion of options/alternatives/ personally creating written customized instructions  in presence of pt  then going over those specific  Instructions directly with the pt including how to use all of the meds but in particular covering each new medication in detail and the difference between the maintenance= "automatic" meds and the prns using an action plan format for the latter (If this problem/symptom => do that organization reading Left to right).  Please see AVS from this visit for a full list of these instructions which I personally wrote for this pt and  are unique to this visit.

## 2016-08-02 NOTE — Progress Notes (Signed)
Subjective:    Patient ID: Ruben Murray, male    DOB: 23-Dec-1945,     MRN: [id number]  HPI  32 yowm quit smoking 2000 at  < 200 lb slowed down by arthritis through the years then acutely ill late Dec 2017 bad cough /sweats and sob > w/in a week admitted  03/06/16   Date of admission: 03/06/2016             Date of discharge: 03/10/2016     Discharge Diagnoses:  Active Problems:   Recurrent erosion of cornea   Community acquired pneumonia   Hypertension   Hyperlipidemia   Right thyroid nodule   Seizures ([place])   CAP (community acquired pneumonia)   Admitted From: HOME Disposition:  HOME  Recommendations for Outpatient Follow-up:  1. Please follow-up with PCP in one week     Follow-up Information    READE,ROBERT ALEXANDER, MD. Schedule an appointment as soon as possible for a visit in 1 week(s).   Specialty:  Family Medicine Contact information: [place] [address] [phone]          Diet recommendation: Cardiac diet  Activity: The patient is advised to gradually reintroduce usual activities.  Discharge Condition: good  Code Status: Full code  History of present illness: As per the H and P dictated on admission, "Ruben Murray a 71 y.o.malewith medical history significant for HTN, HLD, remote seizures, and recent URI treated with ZPAck from 12/10-12/17, presenting to the ED with shortness of breath accompanied And cough is worse over the last 4 days. Associated symptoms include fever, chest pain when coughing, and nausea with vomiting due to new course buildup. He denies hemoptysis. He reported subjective fever at home, without chills. No myalgias. His wife is sick as well with similar symptoms. He denies any abdominal pain. His appetite is normal. He denies any dizziness or vertigo. He denies any lower extremity swelling No confusion was reported. Denies tobacco use. Denies history of COPD or PE "  Hospital  Course:   Summary of his active problems in the hospital is as following. 1.Acute Respiratory Failure with Hypoxia -due to CAP/Sepsis -CT angio neg for PE, with findings ofB LL and RUL pneumonia with likely benign small bilateral hilar and mediastinal lymph nodes  -treated with IV Rocephin and ZIthromax, discharge on oral meds -No growth to date blood Cx -Negative influenza PCR -mucinex/supportive care, flutter device, incentive spirometry predinsone taper  2. Hypertension  -stable continue amlodipine/losartan  3. Hyperlipidemia Continue home statins  4. History of seizures Continue phenobarbital   5. Right lobe of the thyroid nodule Per CT report, appears benign. -follow with PCP   All other chronic medical condition were stable during the hospitalization.  Patient was ambulatory without any assistance. On the day of the discharge the patient's vitals were stable, and no other acute medical condition were reported by patient. the patient was felt safe to be discharge at home with family.  Procedures and Results:  none   Consultations:  none  DISCHARGE MEDICATION:     Discharge Medication List as of 03/10/2016  2:36 PM        START taking these medications   Details  azithromycin (ZITHROMAX) 500 MG tablet Take 1 tablet (500 mg total) by mouth daily., Starting Fri 03/10/2016, Until Mon 03/13/2016, Normal    cephALEXin (KEFLEX) 500 MG capsule Take 1 capsule (500 mg total) by mouth 3 (three) times daily., Starting Fri 03/10/2016, Until  Mon 03/13/2016, Normal    feeding supplement, ENSURE ENLIVE, (ENSURE ENLIVE) LIQD Take 237 mLs by mouth 2 (two) times daily between meals., Starting Fri 03/10/2016, Normal    guaiFENesin (MUCINEX) 600 MG 12 hr tablet Take 2 tablets (1,200 mg total) by mouth 2 (two) times daily., Starting Fri 03/10/2016, Normal    HYDROcodone-homatropine (HYCODAN) 5-1.5 MG/5ML syrup Take 5 mLs by mouth every 4 (four) hours as needed (moderate pain).,  Starting Fri 03/10/2016, Print    polyethylene glycol (MIRALAX / GLYCOLAX) packet Take 17 g by mouth daily as needed., Starting Fri 03/10/2016, Normal    predniSONE (DELTASONE) 10 MG tablet Take 26m daily for 3days,Take 342mdaily for 3days,Take 2038maily for 3days,Take 60m29mily for 3days, then stop, Normal          CONTINUE these medications which have CHANGED   Details  albuterol (PROVENTIL HFA;VENTOLIN HFA) 108 (90 Base) MCG/ACT inhaler Inhale 2 puffs into the lungs every 2 (two) hours as needed for wheezing or shortness of breath (cough)., Starting Fri 03/10/2016, Print    benzonatate (TESSALON) 100 MG capsule Take 1 capsule (100 mg total) by mouth 3 (three) times daily as needed for cough., Starting Fri 03/10/2016, Normal    ipratropium (ATROVENT) 0.03 % nasal spray Place 2 sprays into both nostrils 3 (three) times daily., Starting Fri 03/10/2016, Normal          CONTINUE these medications which have NOT CHANGED   Details  acetaminophen (TYLENOL) 500 MG tablet Take 1,000 mg by mouth every 6 (six) hours as needed (Pain)., Until Discontinued, Historical Med    amLODipine (NORVASC) 5 MG tablet Take 5 mg by mouth every morning., Until Discontinued, Historical Med    aspirin EC 81 MG tablet Take 81 mg by mouth daily., Until Discontinued, Historical Med    losartan-hydrochlorothiazide (HYZAAR) 100-12.5 MG per tablet Take 1 tablet by mouth every morning., Until Discontinued, Historical Med    Multiple Vitamin (MULITIVITAMIN WITH MINERALS) TABS Take 1 tablet by mouth every morning., Until Discontinued, Historical Med    PHENobarbital (LUMINAL) 97.2 MG tablet Take 97.2 mg by mouth every morning., Until Discontinued, Historical Med    pravastatin (PRAVACHOL) 40 MG tablet Take 40 mg by mouth daily., Until Discontinued, Historical Med    tamsulosin (FLOMAX) 0.4 MG CAPS capsule Take 1 capsule (0.4 mg total) by mouth daily., Starting Mon 03/15/2015, Normal         STOP taking  these medications     amoxicillin-clavulanate (AUGMENTIN) 500-125 MG tablet      lactobacillus acidophilus & bulgar (LACTINEX) chewable tablet      loperamide (IMODIUM) 2 MG capsule      08/02/2016 1st [facility] office visit/ Wert   Chief Complaint  Patient presents with  . Pulmonary Consult    referred by Dr. SkaiMarlou Porchm Cardiology, had bronchitis  then turned into pneumonia, mild COPD, SOB with exertion, persistent cough  could not do HT before and can't do now more due to knees than breathing  Recliner x 4 years about 30-45 degrees off cpap since 3-4 m prior to OV s drowisness  Hoarse esp in am  And p supper /assoc dry hacking cough   No obvious day to day or daytime variability or assoc chronic cough or cp or chest tightness, subjective wheeze or overt sinus or hb symptoms. No unusual exp hx or h/o childhood pna/ asthma or knowledge of premature birth.  Sleeping ok without nocturnal  or early am exacerbation  of respiratory  c/o's or need for noct saba. Also denies any obvious fluctuation of symptoms with weather or environmental changes or other aggravating or alleviating factors except as outlined above   Current Medications, Allergies, Complete Past Medical History, Past Surgical History, Family History, and Social History were reviewed in [employer] record.     Review of Systems  Constitutional: Negative for fever and unexpected weight change.  HENT: Positive for congestion and sneezing. Negative for dental problem, ear pain, nosebleeds, postnasal drip, rhinorrhea, sinus pressure, sore throat and trouble swallowing.   Eyes: Negative for redness and itching.  Respiratory: Positive for cough and shortness of breath. Negative for chest tightness and wheezing.   Cardiovascular: Positive for palpitations and leg swelling.  Gastrointestinal: Negative for nausea and vomiting.  Genitourinary: Negative for dysuria.  Musculoskeletal: Negative  for joint swelling.  Skin: Negative for rash.  Neurological: Positive for headaches.  Hematological: Does not bruise/bleed easily.  Psychiatric/Behavioral: Negative for dysphoric mood. The patient is not nervous/anxious.        Objective:   Physical Exam      amb wm gruff voice freq throat clearing   Wt Readings from Last 3 Encounters:  08/02/16 288 lb (130.6 kg)  07/22/16 287 lb (130.2 kg)  07/05/16 287 lb 6.4 oz (130.4 kg)    Vital signs reviewed - Note on arrival 02 sats  94% on RA     HEENT: nl dentition, turbinates bilaterally, and oropharynx which is pristine . Nl external ear canals without cough reflex   NECK :  without JVD/Nodes/TM/ nl carotid upstrokes bilaterally   LUNGS: no acc muscle use,  Nl contour chest which is clear to A and P bilaterally without cough on insp or exp maneuvers   CV:  RRR  no s3 or murmur or increase in P2, and no edema   ABD:  soft and nontender with nl inspiratory excursion in the supine position. No bruits or organomegaly appreciated, bowel sounds nl  MS:  Nl gait/ ext warm without deformities, calf tenderness, cyanosis or clubbing No obvious joint restrictions   SKIN: warm and dry without lesions    NEURO:  alert, approp, nl sensorium with  no motor or cerebellar deficits apparent.      I personally reviewed images and agree with radiology impression as follows:  CXR:   07/22/16 No acute cardiopulmonary disease.   Labs reviewed:      Chemistry      Component Value Date/Time   NA 138 03/10/2016 0559   K 3.7 03/10/2016 0559   CL 100 (L) 03/10/2016 0559   CO2 30 03/10/2016 0559   BUN 15 03/10/2016 0559   CREATININE 0.71 03/10/2016 0559      Component Value Date/Time   CALCIUM 8.6 (L) 03/10/2016 0559   ALKPHOS 102 07/22/2016 1939   AST 25 07/22/2016 1939   ALT 20 07/22/2016 1939   BILITOT 0.6 07/22/2016 1939        Lab Results  Component Value Date   WBC 8.3 07/22/2016   HGB 13.7 07/22/2016   HCT 42.7  07/22/2016   MCV 92.0 07/22/2016   PLT 200 07/22/2016        Lab Results  Component Value Date   TSH 1.870 06/01/2016     Lab Results  Component Value Date   PROBNP 38 07/05/2016            Assessment & Plan:

## 2016-08-09 DIAGNOSIS — I4891 Unspecified atrial fibrillation: Secondary | ICD-10-CM | POA: Diagnosis not present

## 2016-08-09 DIAGNOSIS — Z7901 Long term (current) use of anticoagulants: Secondary | ICD-10-CM | POA: Diagnosis not present

## 2016-08-15 DIAGNOSIS — I4891 Unspecified atrial fibrillation: Secondary | ICD-10-CM | POA: Diagnosis not present

## 2016-08-15 DIAGNOSIS — Z7901 Long term (current) use of anticoagulants: Secondary | ICD-10-CM | POA: Diagnosis not present

## 2016-08-31 ENCOUNTER — Ambulatory Visit (INDEPENDENT_AMBULATORY_CARE_PROVIDER_SITE_OTHER): Payer: Medicare HMO | Admitting: Internal Medicine

## 2016-08-31 ENCOUNTER — Encounter: Payer: Self-pay | Admitting: Internal Medicine

## 2016-08-31 VITALS — BP 106/70 | HR 51 | Ht 68.0 in | Wt 289.2 lb

## 2016-08-31 DIAGNOSIS — I10 Essential (primary) hypertension: Secondary | ICD-10-CM | POA: Diagnosis not present

## 2016-08-31 DIAGNOSIS — R490 Dysphonia: Secondary | ICD-10-CM | POA: Diagnosis not present

## 2016-08-31 DIAGNOSIS — J449 Chronic obstructive pulmonary disease, unspecified: Secondary | ICD-10-CM | POA: Diagnosis not present

## 2016-08-31 HISTORY — DX: Dysphonia: R49.0

## 2016-08-31 NOTE — Assessment & Plan Note (Signed)
Body mass index is 43.97 kg/m.  -  trending up slightly  Lab Results  Component Value Date   TSH 1.870 06/01/2016     Contributing to gerd risk/ doe/reviewed the need and the process to achieve and maintain neg calorie balance > defer f/u primary care including intermittently monitoring thyroid status

## 2016-08-31 NOTE — Assessment & Plan Note (Signed)
Changed from coreg to bisoprolol due to copd with reversible component  08/02/2016 >>>   Although even in retrospect it may not be clear the coreg contributed to the pt's symptoms,  Pt improved off it and adding coreg back at this point or in the future would risk confusion in interpretation of non-specific respiratory symptoms to which this patient is prone  ie  Better not to muddy the waters here.   His bp is overcontrolled though so need to try reduce the hyzar by one half and f/u with cards/ pcp as planned - see avs for instructions unique to this ov

## 2016-08-31 NOTE — Progress Notes (Signed)
Subjective:    Patient ID: Ruben Murray, male    DOB: 23-Dec-1945,     MRN: [id number]  HPI  32 yowm quit smoking 2000 at  < 200 lb slowed down by arthritis through the years then acutely ill late Dec 2017 bad cough /sweats and sob > w/in a week admitted  03/06/16   Date of admission: 03/06/2016             Date of discharge: 03/10/2016     Discharge Diagnoses:  Active Problems:   Recurrent erosion of cornea   Community acquired pneumonia   Hypertension   Hyperlipidemia   Right thyroid nodule   Seizures ([place])   CAP (community acquired pneumonia)   Admitted From: HOME Disposition:  HOME  Recommendations for Outpatient Follow-up:  1. Please follow-up with PCP in one week     Follow-up Information    READE,ROBERT ALEXANDER, MD. Schedule an appointment as soon as possible for a visit in 1 week(s).   Specialty:  Family Medicine Contact information: [place] [address] [phone]          Diet recommendation: Cardiac diet  Activity: The patient is advised to gradually reintroduce usual activities.  Discharge Condition: good  Code Status: Full code  History of present illness: As per the H and P dictated on admission, "Ruben Murray a 71 y.o.malewith medical history significant for HTN, HLD, remote seizures, and recent URI treated with ZPAck from 12/10-12/17, presenting to the ED with shortness of breath accompanied And cough is worse over the last 4 days. Associated symptoms include fever, chest pain when coughing, and nausea with vomiting due to new course buildup. He denies hemoptysis. He reported subjective fever at home, without chills. No myalgias. His wife is sick as well with similar symptoms. He denies any abdominal pain. His appetite is normal. He denies any dizziness or vertigo. He denies any lower extremity swelling No confusion was reported. Denies tobacco use. Denies history of COPD or PE "  Hospital  Course:   Summary of his active problems in the hospital is as following. 1.Acute Respiratory Failure with Hypoxia -due to CAP/Sepsis -CT angio neg for PE, with findings ofB LL and RUL pneumonia with likely benign small bilateral hilar and mediastinal lymph nodes  -treated with IV Rocephin and ZIthromax, discharge on oral meds -No growth to date blood Cx -Negative influenza PCR -mucinex/supportive care, flutter device, incentive spirometry predinsone taper  2. Hypertension  -stable continue amlodipine/losartan  3. Hyperlipidemia Continue home statins  4. History of seizures Continue phenobarbital   5. Right lobe of the thyroid nodule Per CT report, appears benign. -follow with PCP   All other chronic medical condition were stable during the hospitalization.  Patient was ambulatory without any assistance. On the day of the discharge the patient's vitals were stable, and no other acute medical condition were reported by patient. the patient was felt safe to be discharge at home with family.  Procedures and Results:  none   Consultations:  none  DISCHARGE MEDICATION:     Discharge Medication List as of 03/10/2016  2:36 PM        START taking these medications   Details  azithromycin (ZITHROMAX) 500 MG tablet Take 1 tablet (500 mg total) by mouth daily., Starting Fri 03/10/2016, Until Mon 03/13/2016, Normal    cephALEXin (KEFLEX) 500 MG capsule Take 1 capsule (500 mg total) by mouth 3 (three) times daily., Starting Fri 03/10/2016, Until  Mon 03/13/2016, Normal    feeding supplement, ENSURE ENLIVE, (ENSURE ENLIVE) LIQD Take 237 mLs by mouth 2 (two) times daily between meals., Starting Fri 03/10/2016, Normal    guaiFENesin (MUCINEX) 600 MG 12 hr tablet Take 2 tablets (1,200 mg total) by mouth 2 (two) times daily., Starting Fri 03/10/2016, Normal    HYDROcodone-homatropine (HYCODAN) 5-1.5 MG/5ML syrup Take 5 mLs by mouth every 4 (four) hours as needed (moderate pain).,  Starting Fri 03/10/2016, Print    polyethylene glycol (MIRALAX / GLYCOLAX) packet Take 17 g by mouth daily as needed., Starting Fri 03/10/2016, Normal    predniSONE (DELTASONE) 10 MG tablet Take 20m daily for 3days,Take 325mdaily for 3days,Take 2039maily for 3days,Take 42m23mily for 3days, then stop, Normal          CONTINUE these medications which have CHANGED   Details  albuterol (PROVENTIL HFA;VENTOLIN HFA) 108 (90 Base) MCG/ACT inhaler Inhale 2 puffs into the lungs every 2 (two) hours as needed for wheezing or shortness of breath (cough)., Starting Fri 03/10/2016, Print    benzonatate (TESSALON) 100 MG capsule Take 1 capsule (100 mg total) by mouth 3 (three) times daily as needed for cough., Starting Fri 03/10/2016, Normal    ipratropium (ATROVENT) 0.03 % nasal spray Place 2 sprays into both nostrils 3 (three) times daily., Starting Fri 03/10/2016, Normal          CONTINUE these medications which have NOT CHANGED   Details  acetaminophen (TYLENOL) 500 MG tablet Take 1,000 mg by mouth every 6 (six) hours as needed (Pain)., Until Discontinued, Historical Med    amLODipine (NORVASC) 5 MG tablet Take 5 mg by mouth every morning., Until Discontinued, Historical Med    aspirin EC 81 MG tablet Take 81 mg by mouth daily., Until Discontinued, Historical Med    losartan-hydrochlorothiazide (HYZAAR) 100-12.5 MG per tablet Take 1 tablet by mouth every morning., Until Discontinued, Historical Med    Multiple Vitamin (MULITIVITAMIN WITH MINERALS) TABS Take 1 tablet by mouth every morning., Until Discontinued, Historical Med    PHENobarbital (LUMINAL) 97.2 MG tablet Take 97.2 mg by mouth every morning., Until Discontinued, Historical Med    pravastatin (PRAVACHOL) 40 MG tablet Take 40 mg by mouth daily., Until Discontinued, Historical Med    tamsulosin (FLOMAX) 0.4 MG CAPS capsule Take 1 capsule (0.4 mg total) by mouth daily., Starting Mon 03/15/2015, Normal         STOP taking  these medications     amoxicillin-clavulanate (AUGMENTIN) 500-125 MG tablet      lactobacillus acidophilus & bulgar (LACTINEX) chewable tablet      loperamide (IMODIUM) 2 MG capsule      08/02/2016 1st [facility] office visit/ Heydy Montilla   Chief Complaint  Patient presents with  . Pulmonary Consult    referred by Dr. SkaiMarlou Porchm Cardiology, had bronchitis  then turned into pneumonia, mild COPD, SOB with exertion, persistent cough  could not do HT before and can't do now more due to knees than breathing  Recliner x 4 years about 30-45 degrees due to back  off cpap since 3-4 m prior to OV s drowisness  Hoarse esp in am  And p supper /assoc dry hacking cough  rec Stop corevidol and start bisoprolol 5 mg daily  Pantoprazole (protonix) 40 mg   Take  30-60 min before first meal of the day and Pepcid (famotidine)  20 mg after supper   until return to office - this is the best way to tell whether  stomach acid is contributing to your problem.  GERD diet     08/31/2016  f/u ov/Kortlynn Poust re:  COPD II/ uacs on no pulmonary r/x  - now off coreg and on bisoprolol  Chief Complaint  Patient presents with  . Follow-up    Breathing is doing well. He is coughing less and when he does it's non prod. He only c/o hoarseness.   Not limited by breathing from desired activities  But very sedentary due to knees Lots of am and pm cough and singing in between is ok     No obvious day to day or daytime variability or assoc excess/ purulent sputum or mucus plugs or hemoptysis or cp or chest tightness, subjective wheeze or overt sinus or hb symptoms. No unusual exp hx or h/o childhood pna/ asthma or knowledge of premature birth.  Sleeping ok without nocturnal  or early am exacerbation  of respiratory  c/o's or need for noct saba. Also denies any obvious fluctuation of symptoms with weather or environmental changes or other aggravating or alleviating factors except as outlined above   Current Medications,  Allergies, Complete Past Medical History, Past Surgical History, Family History, and Social History were reviewed in [employer] record.  ROS  The following are not active complaints unless bolded sore throat, dysphagia, dental problems, itching, sneezing,  nasal congestion or excess/ purulent secretions, ear ache,   fever, chills, sweats, unintended wt loss, classically pleuritic or exertional cp,  orthopnea pnd or leg swelling, presyncope, palpitations, abdominal pain, anorexia, nausea, vomiting, diarrhea  or change in bowel or bladder habits, change in stools or urine, dysuria,hematuria,  rash, arthralgias, visual complaints, headache, numbness, weakness or ataxia or problems with walking or coordination,  change in mood/affect or memory.           .        Objective:   Physical Exam    amb wm very gruff voice but no longer throat clearing   08/31/2016       289   08/02/16 288 lb (130.6 kg)  07/22/16 287 lb (130.2 kg)  07/05/16 287 lb 6.4 oz (130.4 kg)    Vital signs reviewed - Note on arrival 02 sats  97% on RA with bp 106/70   And pulse 51   HEENT: nl dentition, turbinates bilaterally, and oropharynx which is pristine . Nl external ear canals without cough reflex   NECK :  without JVD/Nodes/TM/ nl carotid upstrokes bilaterally   LUNGS: no acc muscle use,  Nl contour chest which is clear to A and P bilaterally without cough on insp or exp maneuvers   CV:  RRR  no s3 or murmur or increase in P2, and no edema   ABD:  Quite obese soft and nontender with nl inspiratory excursion in the supine position. No bruits or organomegaly appreciated, bowel sounds nl  MS:  Nl gait/ ext warm without deformities, calf tenderness, cyanosis or clubbing No obvious joint restrictions   SKIN: warm and dry without lesions    NEURO:  alert, approp, nl sensorium with  no motor or cerebellar deficits apparent.      I personally reviewed images and agree with radiology  impression as follows:  CXR:   07/22/16 No acute cardiopulmonary disease.            Assessment & Plan:

## 2016-08-31 NOTE — Assessment & Plan Note (Signed)
Quit smoking 2000  PFT's  07/10/2016  FEV1 1.98 (70 % ) ratio 67  p 19 % improvement from saba p nothing  prior to study while of coreg so rec as of 08/02/2016 try off coreg and on bisoprolol    He is improved off coreg and needing no inhalers at all so pulmonary f/u is prn   I did advise him one reason he has no limiting doe is that he's limited by legs at low level of Ve demand but this is not likely to change in foreseeable future.  As I explained to this patient in detail:  although there may be copd present, it may not be clinically relevant:   it does not appear to be limiting activity tolerance any more than a set of worn tires limits someone from driving a car  around a parking lot.  A new set of Michelins might look good but would have no perceived impact on the performance of the car and would not be worth the cost.  That is to say:   this pt is so sedentary I don't recommend aggressive pulmonary rx at this point unless limiting symptoms arise or acute exacerbations become as issue, neither of which is the case now.  I asked the patient to contact this office at any time in the future should either of these problems arise.    I had an extended discussion with the patient reviewing all relevant studies completed to date and  lasting 15 to 20 minutes of a 25 minute visit    Each maintenance medication was reviewed in detail including most importantly the difference between maintenance and prns and under what circumstances the prns are to be triggered using an action plan format that is not reflected in the computer generated alphabetically organized AVS.    Please see AVS for specific instructions unique to this visit that I personally wrote and verbalized to the the pt in detail and then reviewed with pt  by my nurse highlighting any  changes in therapy recommended at today's visit to their plan of care.

## 2016-08-31 NOTE — Patient Instructions (Addendum)
Reduce the losartan hctz to one half daily  And may need a trial off it completely if throat problems continue   Please see patient coordinator before you leave today  to schedule ENT eval for hoarseness  If you are satisfied with your treatment plan,  let your doctor know and he/she can either refill your medications or you can return here when your prescription runs out.     If in any way you are not 100% satisfied,  please tell us.  If 100% better, tell your friends!  Pulmonary follow up is as needed

## 2016-08-31 NOTE — Assessment & Plan Note (Signed)
Referred to ent 08/31/2016 >>>      Some better on gerd rx but still quite hoarse an singer /remote smoker so need to r/o polyps and if neg w/u would next try off arb based on:  For reasons that may related to vascular permability and nitric oxide pathways but not elevated  bradykinin levels (as seen with  ACEi use) losartan in the generic form has been reported now from mulitple sources  to cause a similar pattern of non-specific  upper airway symptoms as seen with acei.   This has not been reported with exposure to the other ARB's to date, so it may be reasonable  to try either generic diovan or avapro if ARB needed or use an alternative class altogether.   See:  Lelon Frohlich Allergy Asthma Immunol  2008: 101: p [phone]

## 2016-09-12 DIAGNOSIS — I4891 Unspecified atrial fibrillation: Secondary | ICD-10-CM | POA: Diagnosis not present

## 2016-09-12 DIAGNOSIS — Z7901 Long term (current) use of anticoagulants: Secondary | ICD-10-CM | POA: Diagnosis not present

## 2016-09-25 DIAGNOSIS — N400 Enlarged prostate without lower urinary tract symptoms: Secondary | ICD-10-CM | POA: Diagnosis not present

## 2016-09-25 DIAGNOSIS — Z7901 Long term (current) use of anticoagulants: Secondary | ICD-10-CM | POA: Diagnosis not present

## 2016-09-25 DIAGNOSIS — H6121 Impacted cerumen, right ear: Secondary | ICD-10-CM | POA: Diagnosis not present

## 2016-09-25 DIAGNOSIS — Z1389 Encounter for screening for other disorder: Secondary | ICD-10-CM | POA: Diagnosis not present

## 2016-09-25 DIAGNOSIS — L309 Dermatitis, unspecified: Secondary | ICD-10-CM | POA: Diagnosis not present

## 2016-09-25 DIAGNOSIS — M545 Low back pain: Secondary | ICD-10-CM | POA: Diagnosis not present

## 2016-09-25 DIAGNOSIS — I420 Dilated cardiomyopathy: Secondary | ICD-10-CM | POA: Diagnosis not present

## 2016-09-25 DIAGNOSIS — Z0000 Encounter for general adult medical examination without abnormal findings: Secondary | ICD-10-CM | POA: Diagnosis not present

## 2016-09-25 DIAGNOSIS — G40909 Epilepsy, unspecified, not intractable, without status epilepticus: Secondary | ICD-10-CM | POA: Diagnosis not present

## 2016-09-25 DIAGNOSIS — K219 Gastro-esophageal reflux disease without esophagitis: Secondary | ICD-10-CM | POA: Diagnosis not present

## 2016-09-25 DIAGNOSIS — I10 Essential (primary) hypertension: Secondary | ICD-10-CM | POA: Diagnosis not present

## 2016-09-25 DIAGNOSIS — Z1159 Encounter for screening for other viral diseases: Secondary | ICD-10-CM | POA: Diagnosis not present

## 2016-09-25 DIAGNOSIS — E7800 Pure hypercholesterolemia, unspecified: Secondary | ICD-10-CM | POA: Diagnosis not present

## 2016-10-03 DIAGNOSIS — J310 Chronic rhinitis: Secondary | ICD-10-CM | POA: Insufficient documentation

## 2016-10-03 DIAGNOSIS — J330 Polyp of nasal cavity: Secondary | ICD-10-CM | POA: Diagnosis not present

## 2016-10-03 DIAGNOSIS — K219 Gastro-esophageal reflux disease without esophagitis: Secondary | ICD-10-CM | POA: Insufficient documentation

## 2016-10-03 DIAGNOSIS — T485X5A Adverse effect of other anti-common-cold drugs, initial encounter: Secondary | ICD-10-CM

## 2016-10-03 DIAGNOSIS — J339 Nasal polyp, unspecified: Secondary | ICD-10-CM

## 2016-10-03 DIAGNOSIS — R490 Dysphonia: Secondary | ICD-10-CM

## 2016-10-03 DIAGNOSIS — J370 Chronic laryngitis: Secondary | ICD-10-CM | POA: Insufficient documentation

## 2016-10-03 DIAGNOSIS — J343 Hypertrophy of nasal turbinates: Secondary | ICD-10-CM | POA: Diagnosis not present

## 2016-10-03 DIAGNOSIS — H6122 Impacted cerumen, left ear: Secondary | ICD-10-CM | POA: Diagnosis not present

## 2016-10-03 DIAGNOSIS — Z8709 Personal history of other diseases of the respiratory system: Secondary | ICD-10-CM | POA: Diagnosis not present

## 2016-10-03 DIAGNOSIS — Z87891 Personal history of nicotine dependence: Secondary | ICD-10-CM | POA: Diagnosis not present

## 2016-10-03 HISTORY — DX: Adverse effect of other anti-common-cold drugs, initial encounter: T48.5X5A

## 2016-10-03 HISTORY — DX: Dysphonia: R49.0

## 2016-10-03 HISTORY — DX: Chronic laryngitis: J37.0

## 2016-10-03 HISTORY — DX: Gastro-esophageal reflux disease without esophagitis: K21.9

## 2016-10-03 HISTORY — DX: Nasal polyp, unspecified: J33.9

## 2016-10-03 HISTORY — DX: Chronic rhinitis: J31.0

## 2016-10-05 ENCOUNTER — Ambulatory Visit (INDEPENDENT_AMBULATORY_CARE_PROVIDER_SITE_OTHER): Payer: Medicare HMO | Admitting: Cardiology

## 2016-10-05 ENCOUNTER — Encounter (INDEPENDENT_AMBULATORY_CARE_PROVIDER_SITE_OTHER): Payer: Self-pay

## 2016-10-05 ENCOUNTER — Encounter: Payer: Self-pay | Admitting: Cardiology

## 2016-10-05 VITALS — BP 134/72 | HR 86 | Ht 68.0 in | Wt 291.2 lb

## 2016-10-05 DIAGNOSIS — I420 Dilated cardiomyopathy: Secondary | ICD-10-CM | POA: Diagnosis not present

## 2016-10-05 DIAGNOSIS — I700 Atherosclerosis of aorta: Secondary | ICD-10-CM | POA: Diagnosis not present

## 2016-10-05 DIAGNOSIS — J449 Chronic obstructive pulmonary disease, unspecified: Secondary | ICD-10-CM

## 2016-10-05 DIAGNOSIS — I480 Paroxysmal atrial fibrillation: Secondary | ICD-10-CM

## 2016-10-05 NOTE — Progress Notes (Signed)
Cardiology Office Note    Date:  10/05/2016   ID:  Ruben Murray, Ruben Murray [birth year], MRN [id number]  PCP:  [name], MD  Cardiologist:   [name], MD     History of Present Illness:  Ruben Murray is a 71 y.o. male with atrial fibrillation, sleep apnea, previously seen in the atrial fibrillation clinic.  He was hospitalized for pneumonia and bronchitis for 6 days on 03/06/16 and ended up likely going into atrial fibrillation after that point. He then saw Roderic Palau in the atrial fibrillation clinic. Warfarin was started.   No CP. +SOB, mild at baseline. Still having a cough.. Can not sleep flat because of back. Since he sleeps in the recliner, Dr. Maxwell Caul states that he does not need his CPAP. I'm okay with this.  Ejection fraction was 40-45%. Warfarin was utilized. Had constipation with Cardizem. Converted to sinus rhythm with diltiazem. He was changed to carvedilol 6.125 mg twice a day because of his LV dysfunction. Because he is on an anti-seizure medication phenobarbital he was not able to take Eliquis or Xarelto. If they can change him over to [place] or Eliquis would be a possibility.  10/05/16  - now back on the amlodipine - Saw Dr. [name] with ear nose and throat. Had his vocal cords looked at, increased PPI. Hoarseness. He enjoys singing, used to tour up and down the [place]. He now sings in church. - No recent palpitations, no syncope, no chest pain. He will occasionally have some diaphoresis when singing. She knows when to sit down.      Past Medical History:  Diagnosis Date  . CAP (community acquired pneumonia) 03/06/2016  . Hyperlipidemia   . Hypertension   . Myocardial infarction [facility]) 1980-early 2000s X 2   "mild ones" (03/06/2016)  . OSA on CPAP    "not wearing it right now; need to replace parts" (03/06/2016)  . Pneumonia 02/2016  . Pneumonia    "when I was a kid" (03/06/2016)  . Seizures ([place])    "take RX daily" (03/06/2016)    Past Surgical  History:  Procedure Laterality Date  . CIRCUMCISION    . JOINT REPLACEMENT    . PERCUTANEOUS PINNING TOE FRACTURE Left    "big toe  . REPLACEMENT TOTAL KNEE Left   . TONSILLECTOMY AND ADENOIDECTOMY      Current Medications: Outpatient Medications Prior to Visit  Medication Sig Dispense Refill  . acetaminophen (TYLENOL) 500 MG tablet Take 1,000 mg by mouth every 6 (six) hours as needed (Pain).    . bisoprolol (ZEBETA) 5 MG tablet Take 1 tablet (5 mg total) by mouth daily. 30 tablet 11  . docusate sodium (COLACE) 100 MG capsule Take 1 capsule (100 mg total) by mouth every 12 (twelve) hours. 60 capsule 0  . famotidine (PEPCID) 20 MG tablet One at bedtime 30 tablet 2  . losartan-hydrochlorothiazide (HYZAAR) 100-12.5 MG per tablet Take 0.5 tablets by mouth every morning.     . Multiple Vitamin (MULITIVITAMIN WITH MINERALS) TABS Take 1 tablet by mouth every morning.    Marland Kitchen PHENobarbital (LUMINAL) 97.2 MG tablet Take 97.2 mg by mouth every morning.    . polyethylene glycol (MIRALAX / GLYCOLAX) packet Take 17 g by mouth daily as needed. 14 each 0  . pravastatin (PRAVACHOL) 40 MG tablet Take 40 mg by mouth daily.    Marland Kitchen warfarin (COUMADIN) 5 MG tablet Take 1 tablet by mouth daily.    . pantoprazole (PROTONIX) 40  MG tablet Take 1 tablet (40 mg total) by mouth daily. Take 30-60 min before first meal of the day 30 tablet 2   No facility-administered medications prior to visit.      Allergies:   Dilaudid [hydromorphone hcl]; Morphine and related; and Tegretol [carbamazepine]   Social History   Social History  . Marital status: Married    Spouse name: N/A  . Number of children: N/A  . Years of education: N/A   Social History Main Topics  . Smoking status: Former Smoker    Packs/day: 3.00    Years: 48.00    Types: Cigarettes    Quit date: 2000  . Smokeless tobacco: Never Used  . Alcohol use No  . Drug use: No  . Sexual activity: No   Other Topics Concern  . None   Social History  Narrative  . None     Family History:  The patient's family history includes Hypertension in his mother; Other in his father.   ROS:   Please see the history of present illness.    ROS All other systems reviewed and are negative.   PHYSICAL EXAM:   VS:  BP 134/72   Pulse 86   Ht _0  (1.727 m)   Wt 291 lb 3.2 oz (132.1 kg)   BMI 44.28 kg/m    GEN: Well nourished, well developed, in NAD HEENT: normal  Neck: no JVD, carotid bruits, or masses Cardiac: RRR; no murmurs, rubs, or gallops,1+ BLE edema  Respiratory:  clear to auscultation bilaterally, normal work of breathing GI: soft, nontender, nondistended, + BS, obese MS: no deformity or atrophy  Skin: warm and dry, no rash Neuro:  Alert and Oriented x 3, Strength and sensation are intact Psych: euthymic mood, full affect  Wt Readings from Last 3 Encounters:  10/05/16 291 lb 3.2 oz (132.1 kg)  08/31/16 289 lb 3.2 oz (131.2 kg)  08/02/16 288 lb (130.6 kg)      Studies/Labs Reviewed:   EKG:  EKG from 05/08/16 shows sinus rhythm with PACs, sinus arrhythmia. Personally viewed.  Recent Labs: 03/06/2016: B Natriuretic Peptide 129.7 03/10/2016: BUN 15; Creatinine, Ser 0.71; Magnesium 2.5; Potassium 3.7; Sodium 138 06/01/2016: TSH 1.870 07/05/2016: NT-Pro BNP 38 07/22/2016: ALT 20; Hemoglobin 13.7; Platelets 200   Lipid Panel No results found for: CHOL, TRIG, HDL, CHOLHDL, VLDL, LDLCALC, LDLDIRECT  Additional studies/ records that were reviewed today include:   ECHO 04/26/16: - Left ventricle: The cavity size was mildly dilated. Wall   thickness was normal. Systolic function was mildly to moderately   reduced. The estimated ejection fraction was in the range of 40%   to 45%. Abnormal GLPSS at -14%. Diffuse hypokinesis. Doppler   parameters are consistent with abnormal left ventricular   relaxation (grade 1 diastolic dysfunction). The E/e&' ratio is   between 8-15, suggesting indeterminate LV Filling pressure. - Mitral valve:  Mildly thickened leaflets . There was trivial   regurgitation. - Left atrium: The atrium was normal in size. - Right atrium: Moderately dilated. - Tricuspid valve: There was moderate regurgitation. - Pulmonary arteries: PA peak pressure: 42 mm Hg (S). - Inferior vena cava: The vessel was dilated. The respirophasic   diameter changes were blunted (< 50%), consistent with elevated   central venous pressure. - Pericardium, extracardiac: There was no pericardial effusion.  Impressions:  - LVEF 40-45%, mildly dilated LV with global hypokinesis, diastolic   dysfunction and elevated LV filling pressure, trivial MR, normal   LA size, moderate  RAE, moderate TR, RVSP 42 mmHg, dilated IVC, no   pericardial effusion.    ASSESSMENT:    1. Dilated cardiomyopathy ([place])   2. Morbid obesity ([place])   3. Chronic obstructive pulmonary disease, unspecified COPD type ([place])   4. Aortic atherosclerosis (H[facility])   5. Paroxysmal atrial fibrillation (H[facility])      PLAN:  In order of problems listed above:  Dilated Cardiomyopathy  - Ejection fraction 40-45% on echocardiogram. This may be related to his atrial fibrillation but could have other causes such as coronary artery disease, hypertension. However, inspection of his CT scan of his chest did not show any evidence of significant coronary atherosclerosis. Since he is not having chest pain, we will continue to treat with carvedilol and ARB. In approximately 6 months we will repeat echocardiogram to assess ejection fraction once again.  - Now on bisoprolol. Dr. Melvyn Novas.  - Continue with losartan.  - He is not having any significant heart failure type symptoms. NYHA class 1-2.  - He is no longer feeling diaphoretic as he was feeling after his hospitalization with pneumonia and bronchitis and standing up for periods of time sitting in the choir.   Paroxysmal atrial fibrillation  - Currently in sinus rhythm.  - Continue with bisoprolol. He is on Coumadin because  of potential drug interactions with phenobarbital with new agents.  Morbid obesity  - Strongly encourage weight loss. This will be vital for his overall cardiovascular health and well-being. We once again discussed this. He could lose 100 pounds.  Aortic atherosclerosis  - Seen on CT scan. Continue with secondary prevention. No change with statin, blood pressure control.  Medication Adjustments/Labs and Tests Ordered: Current medicines are reviewed at length with the patient today.  Concerns regarding medicines are outlined above.  Medication changes, Labs and Tests ordered today are listed in the Patient Instructions below. Patient Instructions  Medication Instructions:  The current medical regimen is effective;  continue present plan and medications.  Follow-Up: Follow up in 6 months with Bonney Leitz, PA.  You will receive a letter in the mail 2 months before you are due.  Please call us when you receive this letter to schedule your follow up appointment.  If you need a refill on your cardiac medications before your next appointment, please call your pharmacy.  Thank you for choosing [facility]!!        Signed, [name], MD  10/05/2016 1:36 PM    [facility] [address], [place], [place]  [address] Phone: [phone]; Fax: [phone]

## 2016-10-05 NOTE — Patient Instructions (Signed)
Medication Instructions:  The current medical regimen is effective;  continue present plan and medications.  Follow-Up: Follow up in 6 months with Bonney Leitz, PA.  You will receive a letter in the mail 2 months before you are due.  Please call us when you receive this letter to schedule your follow up appointment.  If you need a refill on your cardiac medications before your next appointment, please call your pharmacy.  Thank you for choosing [facility]!!

## 2016-10-10 DIAGNOSIS — Z7901 Long term (current) use of anticoagulants: Secondary | ICD-10-CM | POA: Diagnosis not present

## 2016-10-10 DIAGNOSIS — I4891 Unspecified atrial fibrillation: Secondary | ICD-10-CM | POA: Diagnosis not present

## 2016-11-08 DIAGNOSIS — Z7901 Long term (current) use of anticoagulants: Secondary | ICD-10-CM | POA: Diagnosis not present

## 2016-11-08 DIAGNOSIS — I4891 Unspecified atrial fibrillation: Secondary | ICD-10-CM | POA: Diagnosis not present

## 2016-11-16 DIAGNOSIS — Z7901 Long term (current) use of anticoagulants: Secondary | ICD-10-CM | POA: Diagnosis not present

## 2016-11-16 DIAGNOSIS — I4891 Unspecified atrial fibrillation: Secondary | ICD-10-CM | POA: Diagnosis not present

## 2016-11-23 DIAGNOSIS — I4891 Unspecified atrial fibrillation: Secondary | ICD-10-CM | POA: Diagnosis not present

## 2016-11-23 DIAGNOSIS — Z7901 Long term (current) use of anticoagulants: Secondary | ICD-10-CM | POA: Diagnosis not present

## 2016-11-30 DIAGNOSIS — J370 Chronic laryngitis: Secondary | ICD-10-CM | POA: Diagnosis not present

## 2016-11-30 DIAGNOSIS — K219 Gastro-esophageal reflux disease without esophagitis: Secondary | ICD-10-CM | POA: Diagnosis not present

## 2016-11-30 DIAGNOSIS — I4891 Unspecified atrial fibrillation: Secondary | ICD-10-CM | POA: Diagnosis not present

## 2016-11-30 DIAGNOSIS — Z23 Encounter for immunization: Secondary | ICD-10-CM | POA: Diagnosis not present

## 2016-11-30 DIAGNOSIS — J339 Nasal polyp, unspecified: Secondary | ICD-10-CM | POA: Diagnosis not present

## 2016-11-30 DIAGNOSIS — Z7901 Long term (current) use of anticoagulants: Secondary | ICD-10-CM | POA: Diagnosis not present

## 2016-11-30 DIAGNOSIS — T485X1A Poisoning by other anti-common-cold drugs, accidental (unintentional), initial encounter: Secondary | ICD-10-CM | POA: Diagnosis not present

## 2016-11-30 DIAGNOSIS — J310 Chronic rhinitis: Secondary | ICD-10-CM | POA: Diagnosis not present

## 2016-11-30 DIAGNOSIS — R490 Dysphonia: Secondary | ICD-10-CM | POA: Diagnosis not present

## 2016-12-06 DIAGNOSIS — Z7901 Long term (current) use of anticoagulants: Secondary | ICD-10-CM | POA: Diagnosis not present

## 2016-12-06 DIAGNOSIS — I4891 Unspecified atrial fibrillation: Secondary | ICD-10-CM | POA: Diagnosis not present

## 2016-12-20 DIAGNOSIS — I4891 Unspecified atrial fibrillation: Secondary | ICD-10-CM | POA: Diagnosis not present

## 2016-12-20 DIAGNOSIS — Z7901 Long term (current) use of anticoagulants: Secondary | ICD-10-CM | POA: Diagnosis not present

## 2017-01-17 DIAGNOSIS — I4891 Unspecified atrial fibrillation: Secondary | ICD-10-CM | POA: Diagnosis not present

## 2017-01-17 DIAGNOSIS — Z7901 Long term (current) use of anticoagulants: Secondary | ICD-10-CM | POA: Diagnosis not present

## 2017-01-31 DIAGNOSIS — R490 Dysphonia: Secondary | ICD-10-CM | POA: Diagnosis not present

## 2017-01-31 DIAGNOSIS — H903 Sensorineural hearing loss, bilateral: Secondary | ICD-10-CM | POA: Insufficient documentation

## 2017-01-31 DIAGNOSIS — K219 Gastro-esophageal reflux disease without esophagitis: Secondary | ICD-10-CM | POA: Diagnosis not present

## 2017-01-31 DIAGNOSIS — J339 Nasal polyp, unspecified: Secondary | ICD-10-CM | POA: Diagnosis not present

## 2017-01-31 HISTORY — DX: Sensorineural hearing loss, bilateral: H90.3

## 2017-02-03 ENCOUNTER — Encounter: Payer: Self-pay | Admitting: Internal Medicine

## 2017-02-14 DIAGNOSIS — I4891 Unspecified atrial fibrillation: Secondary | ICD-10-CM | POA: Diagnosis not present

## 2017-02-14 DIAGNOSIS — Z7901 Long term (current) use of anticoagulants: Secondary | ICD-10-CM | POA: Diagnosis not present

## 2017-03-14 DIAGNOSIS — Z7901 Long term (current) use of anticoagulants: Secondary | ICD-10-CM | POA: Diagnosis not present

## 2017-03-14 DIAGNOSIS — I4891 Unspecified atrial fibrillation: Secondary | ICD-10-CM | POA: Diagnosis not present

## 2017-03-15 DIAGNOSIS — H52203 Unspecified astigmatism, bilateral: Secondary | ICD-10-CM | POA: Diagnosis not present

## 2017-03-15 DIAGNOSIS — H5213 Myopia, bilateral: Secondary | ICD-10-CM | POA: Diagnosis not present

## 2017-03-15 DIAGNOSIS — H524 Presbyopia: Secondary | ICD-10-CM | POA: Diagnosis not present

## 2017-03-15 DIAGNOSIS — H25013 Cortical age-related cataract, bilateral: Secondary | ICD-10-CM | POA: Diagnosis not present

## 2017-03-15 DIAGNOSIS — H25011 Cortical age-related cataract, right eye: Secondary | ICD-10-CM | POA: Diagnosis not present

## 2017-03-15 DIAGNOSIS — H2511 Age-related nuclear cataract, right eye: Secondary | ICD-10-CM | POA: Diagnosis not present

## 2017-03-15 DIAGNOSIS — H2513 Age-related nuclear cataract, bilateral: Secondary | ICD-10-CM | POA: Diagnosis not present

## 2017-04-09 DIAGNOSIS — G40909 Epilepsy, unspecified, not intractable, without status epilepticus: Secondary | ICD-10-CM | POA: Diagnosis not present

## 2017-04-09 DIAGNOSIS — H6122 Impacted cerumen, left ear: Secondary | ICD-10-CM | POA: Diagnosis not present

## 2017-04-09 DIAGNOSIS — E7800 Pure hypercholesterolemia, unspecified: Secondary | ICD-10-CM | POA: Diagnosis not present

## 2017-04-09 DIAGNOSIS — M545 Low back pain: Secondary | ICD-10-CM | POA: Diagnosis not present

## 2017-04-09 DIAGNOSIS — N400 Enlarged prostate without lower urinary tract symptoms: Secondary | ICD-10-CM | POA: Diagnosis not present

## 2017-04-09 DIAGNOSIS — Z7901 Long term (current) use of anticoagulants: Secondary | ICD-10-CM | POA: Diagnosis not present

## 2017-04-09 DIAGNOSIS — I10 Essential (primary) hypertension: Secondary | ICD-10-CM | POA: Diagnosis not present

## 2017-04-09 DIAGNOSIS — K219 Gastro-esophageal reflux disease without esophagitis: Secondary | ICD-10-CM | POA: Diagnosis not present

## 2017-04-18 DIAGNOSIS — H25012 Cortical age-related cataract, left eye: Secondary | ICD-10-CM | POA: Diagnosis not present

## 2017-04-18 DIAGNOSIS — H2512 Age-related nuclear cataract, left eye: Secondary | ICD-10-CM | POA: Diagnosis not present

## 2017-04-18 DIAGNOSIS — H2511 Age-related nuclear cataract, right eye: Secondary | ICD-10-CM | POA: Diagnosis not present

## 2017-04-18 DIAGNOSIS — H5703 Miosis: Secondary | ICD-10-CM | POA: Diagnosis not present

## 2017-04-18 DIAGNOSIS — H25011 Cortical age-related cataract, right eye: Secondary | ICD-10-CM | POA: Diagnosis not present

## 2017-04-24 DIAGNOSIS — I4891 Unspecified atrial fibrillation: Secondary | ICD-10-CM | POA: Diagnosis not present

## 2017-04-24 DIAGNOSIS — Z7901 Long term (current) use of anticoagulants: Secondary | ICD-10-CM | POA: Diagnosis not present

## 2017-04-25 DIAGNOSIS — H2512 Age-related nuclear cataract, left eye: Secondary | ICD-10-CM | POA: Diagnosis not present

## 2017-04-25 DIAGNOSIS — H25012 Cortical age-related cataract, left eye: Secondary | ICD-10-CM | POA: Diagnosis not present

## 2017-05-22 DIAGNOSIS — I4891 Unspecified atrial fibrillation: Secondary | ICD-10-CM | POA: Diagnosis not present

## 2017-05-22 DIAGNOSIS — Z7901 Long term (current) use of anticoagulants: Secondary | ICD-10-CM | POA: Diagnosis not present

## 2017-06-19 DIAGNOSIS — I4891 Unspecified atrial fibrillation: Secondary | ICD-10-CM | POA: Diagnosis not present

## 2017-06-19 DIAGNOSIS — Z7901 Long term (current) use of anticoagulants: Secondary | ICD-10-CM | POA: Diagnosis not present

## 2017-07-04 DIAGNOSIS — R351 Nocturia: Secondary | ICD-10-CM | POA: Diagnosis not present

## 2017-07-04 DIAGNOSIS — N401 Enlarged prostate with lower urinary tract symptoms: Secondary | ICD-10-CM | POA: Diagnosis not present

## 2017-07-04 DIAGNOSIS — R972 Elevated prostate specific antigen [PSA]: Secondary | ICD-10-CM | POA: Diagnosis not present

## 2017-07-17 DIAGNOSIS — I4891 Unspecified atrial fibrillation: Secondary | ICD-10-CM | POA: Diagnosis not present

## 2017-07-17 DIAGNOSIS — Z7901 Long term (current) use of anticoagulants: Secondary | ICD-10-CM | POA: Diagnosis not present

## 2017-07-24 DIAGNOSIS — M19071 Primary osteoarthritis, right ankle and foot: Secondary | ICD-10-CM | POA: Diagnosis not present

## 2017-07-24 DIAGNOSIS — I739 Peripheral vascular disease, unspecified: Secondary | ICD-10-CM | POA: Diagnosis not present

## 2017-07-24 DIAGNOSIS — B351 Tinea unguium: Secondary | ICD-10-CM | POA: Diagnosis not present

## 2017-08-14 DIAGNOSIS — I4891 Unspecified atrial fibrillation: Secondary | ICD-10-CM | POA: Diagnosis not present

## 2017-08-14 DIAGNOSIS — Z7901 Long term (current) use of anticoagulants: Secondary | ICD-10-CM | POA: Diagnosis not present

## 2017-08-20 DIAGNOSIS — I4891 Unspecified atrial fibrillation: Secondary | ICD-10-CM | POA: Diagnosis not present

## 2017-08-20 DIAGNOSIS — Z7901 Long term (current) use of anticoagulants: Secondary | ICD-10-CM | POA: Diagnosis not present

## 2017-08-31 DIAGNOSIS — Z961 Presence of intraocular lens: Secondary | ICD-10-CM | POA: Diagnosis not present

## 2017-09-11 DIAGNOSIS — I4891 Unspecified atrial fibrillation: Secondary | ICD-10-CM | POA: Diagnosis not present

## 2017-09-11 DIAGNOSIS — Z7901 Long term (current) use of anticoagulants: Secondary | ICD-10-CM | POA: Diagnosis not present

## 2017-10-03 DIAGNOSIS — G40909 Epilepsy, unspecified, not intractable, without status epilepticus: Secondary | ICD-10-CM | POA: Diagnosis not present

## 2017-10-03 DIAGNOSIS — N400 Enlarged prostate without lower urinary tract symptoms: Secondary | ICD-10-CM | POA: Diagnosis not present

## 2017-10-03 DIAGNOSIS — G4733 Obstructive sleep apnea (adult) (pediatric): Secondary | ICD-10-CM | POA: Diagnosis not present

## 2017-10-03 DIAGNOSIS — E7800 Pure hypercholesterolemia, unspecified: Secondary | ICD-10-CM | POA: Diagnosis not present

## 2017-10-03 DIAGNOSIS — Z0000 Encounter for general adult medical examination without abnormal findings: Secondary | ICD-10-CM | POA: Diagnosis not present

## 2017-10-03 DIAGNOSIS — Z136 Encounter for screening for cardiovascular disorders: Secondary | ICD-10-CM | POA: Diagnosis not present

## 2017-10-03 DIAGNOSIS — I10 Essential (primary) hypertension: Secondary | ICD-10-CM | POA: Diagnosis not present

## 2017-10-03 DIAGNOSIS — L309 Dermatitis, unspecified: Secondary | ICD-10-CM | POA: Diagnosis not present

## 2017-10-03 DIAGNOSIS — M545 Low back pain: Secondary | ICD-10-CM | POA: Diagnosis not present

## 2017-10-03 DIAGNOSIS — Z7901 Long term (current) use of anticoagulants: Secondary | ICD-10-CM | POA: Diagnosis not present

## 2017-10-17 DIAGNOSIS — M6701 Short Achilles tendon (acquired), right ankle: Secondary | ICD-10-CM | POA: Diagnosis not present

## 2017-10-17 DIAGNOSIS — M25571 Pain in right ankle and joints of right foot: Secondary | ICD-10-CM | POA: Diagnosis not present

## 2017-10-17 DIAGNOSIS — M76821 Posterior tibial tendinitis, right leg: Secondary | ICD-10-CM | POA: Diagnosis not present

## 2017-10-29 ENCOUNTER — Inpatient Hospital Stay (HOSPITAL_COMMUNITY)
Admission: EM | Admit: 2017-10-29 | Discharge: 2017-11-04 | DRG: 292 | Disposition: A | Discharge status: Home Health | Payer: Medicare HMO | Attending: Internal Medicine | Admitting: Internal Medicine

## 2017-10-29 ENCOUNTER — Emergency Department (HOSPITAL_COMMUNITY): Payer: Medicare HMO

## 2017-10-29 ENCOUNTER — Encounter (HOSPITAL_COMMUNITY): Payer: Self-pay | Admitting: *Deleted

## 2017-10-29 ENCOUNTER — Other Ambulatory Visit: Payer: Self-pay

## 2017-10-29 DIAGNOSIS — I452 Bifascicular block: Secondary | ICD-10-CM | POA: Diagnosis not present

## 2017-10-29 DIAGNOSIS — I4581 Long QT syndrome: Secondary | ICD-10-CM | POA: Diagnosis present

## 2017-10-29 DIAGNOSIS — E870 Hyperosmolality and hypernatremia: Secondary | ICD-10-CM | POA: Diagnosis not present

## 2017-10-29 DIAGNOSIS — E785 Hyperlipidemia, unspecified: Secondary | ICD-10-CM | POA: Diagnosis present

## 2017-10-29 DIAGNOSIS — R569 Unspecified convulsions: Secondary | ICD-10-CM | POA: Diagnosis not present

## 2017-10-29 DIAGNOSIS — Z885 Allergy status to narcotic agent status: Secondary | ICD-10-CM

## 2017-10-29 DIAGNOSIS — G40909 Epilepsy, unspecified, not intractable, without status epilepticus: Secondary | ICD-10-CM | POA: Diagnosis present

## 2017-10-29 DIAGNOSIS — I10 Essential (primary) hypertension: Secondary | ICD-10-CM | POA: Diagnosis not present

## 2017-10-29 DIAGNOSIS — N401 Enlarged prostate with lower urinary tract symptoms: Secondary | ICD-10-CM | POA: Diagnosis present

## 2017-10-29 DIAGNOSIS — R001 Bradycardia, unspecified: Secondary | ICD-10-CM | POA: Diagnosis not present

## 2017-10-29 DIAGNOSIS — E7849 Other hyperlipidemia: Secondary | ICD-10-CM | POA: Diagnosis not present

## 2017-10-29 DIAGNOSIS — I110 Hypertensive heart disease with heart failure: Secondary | ICD-10-CM | POA: Diagnosis not present

## 2017-10-29 DIAGNOSIS — I4891 Unspecified atrial fibrillation: Secondary | ICD-10-CM | POA: Diagnosis not present

## 2017-10-29 DIAGNOSIS — I808 Phlebitis and thrombophlebitis of other sites: Secondary | ICD-10-CM | POA: Diagnosis not present

## 2017-10-29 DIAGNOSIS — G4733 Obstructive sleep apnea (adult) (pediatric): Secondary | ICD-10-CM | POA: Diagnosis present

## 2017-10-29 DIAGNOSIS — R9431 Abnormal electrocardiogram [ECG] [EKG]: Secondary | ICD-10-CM | POA: Diagnosis present

## 2017-10-29 DIAGNOSIS — I480 Paroxysmal atrial fibrillation: Secondary | ICD-10-CM | POA: Diagnosis present

## 2017-10-29 DIAGNOSIS — Z96652 Presence of left artificial knee joint: Secondary | ICD-10-CM | POA: Diagnosis present

## 2017-10-29 DIAGNOSIS — L03113 Cellulitis of right upper limb: Secondary | ICD-10-CM | POA: Diagnosis not present

## 2017-10-29 DIAGNOSIS — R0602 Shortness of breath: Secondary | ICD-10-CM | POA: Diagnosis not present

## 2017-10-29 DIAGNOSIS — I5043 Acute on chronic combined systolic (congestive) and diastolic (congestive) heart failure: Secondary | ICD-10-CM | POA: Diagnosis not present

## 2017-10-29 DIAGNOSIS — Z888 Allergy status to other drugs, medicaments and biological substances status: Secondary | ICD-10-CM

## 2017-10-29 DIAGNOSIS — Z66 Do not resuscitate: Secondary | ICD-10-CM | POA: Diagnosis not present

## 2017-10-29 DIAGNOSIS — R609 Edema, unspecified: Secondary | ICD-10-CM | POA: Diagnosis not present

## 2017-10-29 DIAGNOSIS — L03818 Cellulitis of other sites: Secondary | ICD-10-CM | POA: Diagnosis not present

## 2017-10-29 DIAGNOSIS — I429 Cardiomyopathy, unspecified: Secondary | ICD-10-CM | POA: Diagnosis present

## 2017-10-29 DIAGNOSIS — R338 Other retention of urine: Secondary | ICD-10-CM | POA: Diagnosis present

## 2017-10-29 DIAGNOSIS — I5042 Chronic combined systolic (congestive) and diastolic (congestive) heart failure: Secondary | ICD-10-CM | POA: Diagnosis present

## 2017-10-29 DIAGNOSIS — Z79899 Other long term (current) drug therapy: Secondary | ICD-10-CM

## 2017-10-29 DIAGNOSIS — Z8249 Family history of ischemic heart disease and other diseases of the circulatory system: Secondary | ICD-10-CM | POA: Diagnosis not present

## 2017-10-29 DIAGNOSIS — Z6841 Body Mass Index (BMI) 40.0 and over, adult: Secondary | ICD-10-CM | POA: Diagnosis not present

## 2017-10-29 DIAGNOSIS — I252 Old myocardial infarction: Secondary | ICD-10-CM

## 2017-10-29 DIAGNOSIS — I509 Heart failure, unspecified: Secondary | ICD-10-CM | POA: Diagnosis not present

## 2017-10-29 DIAGNOSIS — Z87891 Personal history of nicotine dependence: Secondary | ICD-10-CM

## 2017-10-29 DIAGNOSIS — M7989 Other specified soft tissue disorders: Secondary | ICD-10-CM | POA: Diagnosis not present

## 2017-10-29 DIAGNOSIS — R0902 Hypoxemia: Secondary | ICD-10-CM | POA: Diagnosis not present

## 2017-10-29 DIAGNOSIS — Z7901 Long term (current) use of anticoagulants: Secondary | ICD-10-CM

## 2017-10-29 DIAGNOSIS — I5023 Acute on chronic systolic (congestive) heart failure: Secondary | ICD-10-CM | POA: Diagnosis not present

## 2017-10-29 DIAGNOSIS — M19071 Primary osteoarthritis, right ankle and foot: Secondary | ICD-10-CM | POA: Diagnosis present

## 2017-10-29 DIAGNOSIS — E782 Mixed hyperlipidemia: Secondary | ICD-10-CM | POA: Diagnosis present

## 2017-10-29 HISTORY — DX: Cardiomyopathy, unspecified: I42.9

## 2017-10-29 HISTORY — DX: Atherosclerosis of aorta: I70.0

## 2017-10-29 HISTORY — DX: Morbid (severe) obesity due to excess calories: E66.01

## 2017-10-29 HISTORY — DX: Heart failure, unspecified: I50.9

## 2017-10-29 HISTORY — DX: Chronic combined systolic (congestive) and diastolic (congestive) heart failure: I50.42

## 2017-10-29 HISTORY — DX: Unspecified atrial fibrillation: I48.91

## 2017-10-29 HISTORY — DX: Abnormal electrocardiogram (ECG) (EKG): R94.31

## 2017-10-29 HISTORY — DX: Paroxysmal atrial fibrillation: I48.0

## 2017-10-29 HISTORY — DX: Unspecified right bundle-branch block: I45.10

## 2017-10-29 LAB — CBC
HEMATOCRIT: 45.2 % (ref 39.0–52.0)
HEMOGLOBIN: 14.1 g/dL (ref 13.0–17.0)
MCH: 29.5 pg (ref 26.0–34.0)
MCHC: 31.2 g/dL (ref 30.0–36.0)
MCV: 94.6 fL (ref 78.0–100.0)
Platelets: 165 10*3/uL (ref 150–400)
RBC: 4.78 MIL/uL (ref 4.22–5.81)
RDW: 14.6 % (ref 11.5–15.5)
WBC: 8.8 10*3/uL (ref 4.0–10.5)

## 2017-10-29 LAB — PROTIME-INR
INR: 2.1
Prothrombin Time: 23.4 seconds — ABNORMAL HIGH (ref 11.4–15.2)

## 2017-10-29 LAB — BASIC METABOLIC PANEL
Anion gap: 9 (ref 5–15)
BUN: 19 mg/dL (ref 8–23)
CHLORIDE: 106 mmol/L (ref 98–111)
CO2: 28 mmol/L (ref 22–32)
Calcium: 8.8 mg/dL — ABNORMAL LOW (ref 8.9–10.3)
Creatinine, Ser: 0.94 mg/dL (ref 0.61–1.24)
GFR calc Af Amer: >60 mL/min (ref >60)
GFR calc non Af Amer: >60 mL/min (ref >60)
GLUCOSE: 110 mg/dL — AB (ref 70–99)
POTASSIUM: 3.8 mmol/L (ref 3.5–5.1)
Sodium: 143 mmol/L (ref 135–145)

## 2017-10-29 LAB — BRAIN NATRIURETIC PEPTIDE: B NATRIURETIC PEPTIDE 5: 539.2 pg/mL — AB (ref 0.0–100.0)

## 2017-10-29 LAB — I-STAT TROPONIN, ED: Troponin i, poc: 0 ng/mL (ref 0.00–0.08)

## 2017-10-29 LAB — MAGNESIUM: MAGNESIUM: 1.9 mg/dL (ref 1.7–2.4)

## 2017-10-29 MED ORDER — SODIUM CHLORIDE 0.9% FLUSH
3.0000 mL | Freq: Two times a day (BID) | INTRAVENOUS | Status: DC
  Administered 2017-10-29 – 2017-11-03 (×10): 3 mL via INTRAVENOUS

## 2017-10-29 MED ORDER — VERAPAMIL HCL 2.5 MG/ML IV SOLN
5.0000 mg | Freq: Once | INTRAVENOUS | Status: AC
  Administered 2017-10-29: 5 mg via INTRAVENOUS
  Filled 2017-10-29: qty 2

## 2017-10-29 MED ORDER — BISOPROLOL FUMARATE 5 MG PO TABS
5.0000 mg | ORAL_TABLET | Freq: Every day | ORAL | Status: DC
  Administered 2017-10-30 – 2017-10-31 (×2): 5 mg via ORAL
  Filled 2017-10-29 (×2): qty 1

## 2017-10-29 MED ORDER — PANTOPRAZOLE SODIUM 40 MG PO TBEC
40.0000 mg | DELAYED_RELEASE_TABLET | Freq: Two times a day (BID) | ORAL | Status: DC
  Administered 2017-10-29 – 2017-11-04 (×12): 40 mg via ORAL
  Filled 2017-10-29 (×13): qty 1

## 2017-10-29 MED ORDER — PHENOBARBITAL 32.4 MG PO TABS
97.2000 mg | ORAL_TABLET | Freq: Every morning | ORAL | Status: DC
  Administered 2017-10-30 – 2017-11-04 (×6): 97.2 mg via ORAL
  Filled 2017-10-29 (×6): qty 3

## 2017-10-29 MED ORDER — LOSARTAN POTASSIUM 25 MG PO TABS
12.5000 mg | ORAL_TABLET | Freq: Every day | ORAL | Status: DC
  Administered 2017-10-30: 12.5 mg via ORAL
  Filled 2017-10-29: qty 1

## 2017-10-29 MED ORDER — ACETAMINOPHEN 325 MG PO TABS
650.0000 mg | ORAL_TABLET | Freq: Four times a day (QID) | ORAL | Status: DC | PRN
  Administered 2017-11-01 – 2017-11-03 (×2): 650 mg via ORAL
  Filled 2017-10-29 (×2): qty 2

## 2017-10-29 MED ORDER — SODIUM CHLORIDE 0.9% FLUSH: 3.0000 mL | INTRAVENOUS | Status: DC | PRN

## 2017-10-29 MED ORDER — ACETAMINOPHEN 650 MG RE SUPP: 650.0000 mg | Freq: Four times a day (QID) | RECTAL | Status: DC | PRN

## 2017-10-29 MED ORDER — FUROSEMIDE 10 MG/ML IJ SOLN
40.0000 mg | Freq: Once | INTRAMUSCULAR | Status: AC
  Administered 2017-10-29: 40 mg via INTRAVENOUS
  Filled 2017-10-29: qty 4

## 2017-10-29 MED ORDER — ORAL CARE MOUTH RINSE
15.0000 mL | Freq: Two times a day (BID) | OROMUCOSAL | Status: DC
  Administered 2017-10-30 – 2017-11-04 (×9): 15 mL via OROMUCOSAL

## 2017-10-29 MED ORDER — POLYETHYLENE GLYCOL 3350 17 G PO PACK: 17.0000 g | PACK | Freq: Every day | ORAL | Status: DC | PRN

## 2017-10-29 MED ORDER — SODIUM CHLORIDE 0.9 % IV SOLN: 250.0000 mL | INTRAVENOUS | Status: DC | PRN

## 2017-10-29 NOTE — ED Triage Notes (Signed)
Pt reports ankle pain that is inoperative due to other health issues.

## 2017-10-29 NOTE — ED Triage Notes (Signed)
EMS reported Pt rythm A-fib and Pt not sure why he takes Coumadin. Pt was noted to be [facility] with ambulation . Pt reported to EMS [facility] and CP.

## 2017-10-29 NOTE — H&P (Addendum)
Ruben Murray RCB:[id number] DOB: 06/02/1945 DOA: 10/29/2017     PCP: [name], MD   Outpatient Specialists:   CARDS:   Dr. [name]   Patient arrived to ER on 10/29/17 at 1725  Patient coming from:    home Lives   With family    Chief Complaint:  Chief Complaint  Patient presents with  . Atrial Fibrillation    HPI: Ruben Murray is a 72 y.o. male with medical history significant of atrial fibrillation on coumadin , sleep apnea, Systolic CHF 80-32% seizure disorder on phenobarbital  Presented with   Shortness of breath and chest pain for 2 weeks been a bit short of breath of and off. Today while eating dinner became severe short of breath and fatigued.  Patient has noted worsening abdominal distention as well as lower extremity edema his right has been larger than left for quite some time. Today Patient felt very lightheaded and short of breath but did not had any palpitations. No chest pain patient EMS today and they arrived and noted that he was in atrial fibrillation. Patient was not aware of it but has history of atrial fibrillation for which she is on Coumadin.  Patient has been having some ankle pain has history of knee surgery. His ankle on the right has been swelling and he have had difficulty ambulating. Patient states that he's been seen by orthopedics but was told not an operative candidate secondary to multiple medical problems. This been on ongoing issue patient is on Coumadin and is therapeutic.  Patient has known history of atrial fibrillation did not tolerate Cardizem in the past due to constipation but did convert to sinus rhythm and from then on has been on Coreg. Patient on Coumadin for anticoagulation felt to be not a candidate for DOAC secondary to being on phenobarbital  Regarding pertinent Chronic problems: last echo 1 year ago 2018 showing diffuse hypokinesis EF 40-45 percent with grade 1 diastolic dysfunction  Hx of Seizures last was 20 years  ago   While in ER:to be in A. Fib with RVR heart rates 120s prolonged QTC of 534 Initial troponin unremarkable patient was given verapamil and Lasix  Patient heart rate now came down to mid 80s but continues to be in atrial fibrillation. Blood pressure 120s over 80s Patient states he feels better now The following Work up has been ordered so far:  Orders Placed This Encounter  Procedures  . DG Chest 2 View  . Basic metabolic panel  . Magnesium  . Brain natriuretic peptide (order ONLY if patient c/o SOB)  . CBC  . Protime-INR  . Cardiac monitoring  . Height and weight  . Initiate Carrier Fluid Protocol  . If O2 sat If O2 sat < 94% administer O2 at 2 liters/minute via nasal canula  . Consult to hospitalist  . Pulse oximetry, continuous  . I-stat troponin, ED  . EKG 12-Lead  . ED EKG  . Insert peripheral IV      Following Medications were ordered in ER: Medications  furosemide (LASIX) injection 40 mg (has no administration in time range)  verapamil (ISOPTIN) injection 5 mg (5 mg Intravenous Given 10/29/17 1903)    Significant initial  Findings: Abnormal Labs Reviewed  BASIC METABOLIC PANEL - Abnormal; Notable for the following components:      Result Value   Glucose, Bld 110 (*)    Calcium 8.8 (*)    All other components within normal limits  BRAIN NATRIURETIC  PEPTIDE - Abnormal; Notable for the following components:   B Natriuretic Peptide 539.2 (*)    All other components within normal limits  PROTIME-INR - Abnormal; Notable for the following components:   Prothrombin Time 23.4 (*)    All other components within normal limits     Na 143 K 3.8  Cr    stable,    Lab Results  Component Value Date   CREATININE 0.94 10/29/2017   CREATININE 0.71 03/10/2016   CREATININE 0.83 03/08/2016    BNP 539  WBC  8.8  HG/HCT  Stable,     Component Value Date/Time   HGB 14.1 10/29/2017 1840   HCT 45.2 10/29/2017 1840       Troponin (Point of Care Test) Recent  Labs    10/29/17 1856  TROPIPOC 0.00     INR 2.1  BNP (last 3 results) Recent Labs    10/29/17 1840  BNP 539.2*    ProBNP (last 3 results) No results for input(s): PROBNP in the last 8760 hours.  Lactic Acid, Venous No results found for: LATICACIDVEN       CXR - cardiomegaly NON acute    ECG:  Personally reviewed by me showing: HR : 124 Rhythm:, A.fib w RVR. RBBB,   no evidence of ischemic changes QTC 534       ED Triage Vitals  Enc Vitals Group     BP 10/29/17 1759 115/88     Pulse Rate 10/29/17 1759 (!) 118     Resp 10/29/17 1759 16     Temp 10/29/17 1759 98.2 F (36.8 C)     Temp Source 10/29/17 1759 Oral     SpO2 10/29/17 1759 97 %     Weight --      Height --      Head Circumference --      Peak Flow --      Pain Score 10/29/17 1916 0     Pain Loc --      Pain Edu? --      Excl. in [place]? --   TMAX(24)@       Latest  Blood pressure 121/80, pulse (!) 102, temperature 98.2 F (36.8 C), temperature source Oral, resp. rate 16, SpO2 95 %.   Hospitalist was called for admission for A.fib w RVR   Review of Systems:    Pertinent positives include: fatigue, shortness of breath at rest  dyspnea on exertion,  Constitutional:  No weight loss, night sweats, Fevers, chills, weight loss  HEENT:  No headaches, Difficulty swallowing,Tooth/dental problems,Sore throat,  No sneezing, itching, ear ache, nasal congestion, post nasal drip,  Cardio-vascular:  No chest pain, Orthopnea, PND, anasarca, dizziness, palpitations.no Bilateral lower extremity swelling  GI:  No heartburn, indigestion, abdominal pain, nausea, vomiting, diarrhea, change in bowel habits, loss of appetite, melena, blood in stool, hematemesis Resp:  No excess mucus, no productive cough, No non-productive cough, No coughing up of blood.No change in color of mucus.No wheezing. Skin:  no rash or lesions. No jaundice GU:  no dysuria, change in color of urine, no urgency or frequency. No  straining to urinate.  No flank pain.  Musculoskeletal:  No joint pain or no joint swelling. No decreased range of motion. No back pain.  Psych:  No change in mood or affect. No depression or anxiety. No memory loss.  Neuro: no localizing neurological complaints, no tingling, no weakness, no double vision, no gait abnormality, no slurred speech, no confusion  All systems reviewed  and apart from Zebulon all are negative  Past Medical History:   Past Medical History:  Diagnosis Date  . CAP (community acquired pneumonia) 03/06/2016  . Hyperlipidemia   . Hypertension   . Myocardial infarction [facility]) 1980-early 2000s X 2   "mild ones" (03/06/2016)  . OSA on CPAP    "not wearing it right now; need to replace parts" (03/06/2016)  . Pneumonia 02/2016  . Pneumonia    "when I was a kid" (03/06/2016)  . Seizures ([place])    "take RX daily" (03/06/2016)      Past Surgical History:  Procedure Laterality Date  . CIRCUMCISION    . JOINT REPLACEMENT    . PERCUTANEOUS PINNING TOE FRACTURE Left    "big toe  . REPLACEMENT TOTAL KNEE Left   . TONSILLECTOMY AND ADENOIDECTOMY      Social History:  Ambulatory  cane,      reports that he quit smoking about 19 years ago. His smoking use included cigarettes. He has a 144.00 pack-year smoking history. He has never used smokeless tobacco. He reports that he does not drink alcohol or use drugs.     Family History:   Family History  Problem Relation Age of Onset  . Hypertension Mother   . Other Father        sudden cardiac arrest    Allergies: Allergies  Allergen Reactions  . Dilaudid [Hydromorphone Hcl] Other (See Comments)    Reaction:  Makes pt hyper   . Morphine And Related Other (See Comments)    Pt states that this medication makes him feel like he is out of it.    . Tegretol [Carbamazepine] Hives     Prior to Admission medications   Medication Sig Start Date End Date Taking? Authorizing Provider  acetaminophen (TYLENOL) 500 MG tablet  Take 1,000 mg by mouth every 6 (six) hours as needed (Pain).    [provider]  amLODipine (NORVASC) 5 MG tablet Take 5 mg by mouth daily. 09/26/16   [provider]  bisoprolol (ZEBETA) 5 MG tablet Take 1 tablet (5 mg total) by mouth daily. 08/02/16   Tanda Rockers, MD  docusate sodium (COLACE) 100 MG capsule Take 1 capsule (100 mg total) by mouth every 12 (twelve) hours. 07/22/16   Charlesetta Shanks, MD  famotidine (PEPCID) 20 MG tablet One at bedtime 08/02/16   Tanda Rockers, MD  losartan-hydrochlorothiazide (HYZAAR) 100-12.5 MG per tablet Take 0.5 tablets by mouth every morning.     [provider]  Multiple Vitamin (MULITIVITAMIN WITH MINERALS) TABS Take 1 tablet by mouth every morning.    [provider]  pantoprazole (PROTONIX) 40 MG tablet Take 40 mg by mouth 2 (two) times daily.    [provider]  PHENobarbital (LUMINAL) 97.2 MG tablet Take 97.2 mg by mouth every morning.    [provider]  polyethylene glycol (MIRALAX / GLYCOLAX) packet Take 17 g by mouth daily as needed. 03/10/16   Lavina Hamman, MD  pravastatin (PRAVACHOL) 40 MG tablet Take 40 mg by mouth daily.    [provider]  warfarin (COUMADIN) 5 MG tablet Take 1 tablet by mouth daily. 07/31/16   [provider]   Physical Exam: Blood pressure 121/80, pulse (!) 102, temperature 98.2 F (36.8 C), temperature source Oral, resp. rate 16, SpO2 95 %. 1. General:  in No Acute distress * Chronically ill *well *cachectic *toxic acutely ill -appearing 2. Psychological: Alert and  Oriented 3. Head/ENT:  Dry Mucous Membranes                          Head Non traumatic, neck supple                           Poor Dentition 4. SKIN:  decreased Skin turgor,  Skin clean Dry and intact no rash 5. Heart: Regular rate and rhythm no  Murmur, no Rub or gallop 6. Lungs:  no wheezes or crackles   7. Abdomen: Soft, non-tender,   distended  obese  bowel sounds present 8.  Lower extremities: no clubbing, cyanosis, !-+ R>L edema 9. Neurologically Grossly intact, moving all 4 extremities equally   10. MSK: Normal range of motion   LABS:     Recent Labs  Lab 10/29/17 1840  WBC 8.8  HGB 14.1  HCT 45.2  MCV 94.6  PLT 893   Basic Metabolic Panel: Recent Labs  Lab 10/29/17 1840  NA 143  K 3.8  CL 106  CO2 28  GLUCOSE 110*  BUN 19  CREATININE 0.94  CALCIUM 8.8*  MG 1.9      No results for input(s): AST, ALT, ALKPHOS, BILITOT, PROT, ALBUMIN in the last 168 hours. No results for input(s): LIPASE, AMYLASE in the last 168 hours. No results for input(s): AMMONIA in the last 168 hours.    HbA1C: No results for input(s): HGBA1C in the last 72 hours. CBG: No results for input(s): GLUCAP in the last 168 hours.    Urine analysis:    Component Value Date/Time   COLORURINE YELLOW 03/06/2016 0740   APPEARANCEUR CLEAR 03/06/2016 0740   LABSPEC 1.033 (H) 03/06/2016 0740   PHURINE 5.0 03/06/2016 0740   GLUCOSEU NEGATIVE 03/06/2016 0740   HGBUR SMALL (A) 03/06/2016 0740   BILIRUBINUR NEGATIVE 03/06/2016 0740   KETONESUR 5 (A) 03/06/2016 0740   PROTEINUR 30 (A) 03/06/2016 0740   UROBILINOGEN 0.2 05/06/2010 1010   NITRITE NEGATIVE 03/06/2016 0740   LEUKOCYTESUR NEGATIVE 03/06/2016 0740      Cultures:    Component Value Date/Time   SDES EXPECTORATED SPUTUM 03/06/2016 0841   SDES EXPECTORATED SPUTUM 03/06/2016 0841   SPECREQUEST NONE 03/06/2016 0841   SPECREQUEST NONE Reflexed from [id number] 03/06/2016 0841   CULT Consistent with normal respiratory flora. 03/06/2016 0841   REPTSTATUS 03/07/2016 FINAL 03/06/2016 0841   REPTSTATUS 03/09/2016 FINAL 03/06/2016 0841     Radiological Exams on Admission: Dg Chest 2 View  Result Date: 10/29/2017 CLINICAL DATA:  RIGHT leg pain and swelling, shortness of breath and atrial fibrillation. EXAM: CHEST - 2 VIEW COMPARISON:  07/22/2016. FINDINGS: Cardiomegaly. No consolidation or edema. Mild scarring LEFT  base. No effusion or pneumothorax. Bones unremarkable. IMPRESSION: Cardiomegaly increased from priors.  No active disease. Electronically Signed   By: Staci Righter M.D.   On: 10/29/2017 18:49    Chart has been reviewed    Assessment/Plan   72 y.o. male with medical history significant of atrial fibrillation on coumadin , sleep apnea, Systolic CHF 68-11% seizure disorder on phenobarbital Admitted for CHF exacerbation in the setting of transient A. Fib with RVR currently heart rate controlled  Present on Admission:  . Atrial fibrillation with RVR ([place]) patient was started on verapamil in emergency department currently improved trend down to 80s At this point no indication for IV infusion. For now would continue home beta blocker Zebeta continue Coumadin INR is therapeutic. Would benefit from  cardiology input Will cycle cardiac enzymes.  Acute on chronic combined systolic and diastolic CHF -  - admit on telemetry,  cycle cardiac enzymes,  Troponin (Point of Care Test) Recent Labs    10/29/17 1856  TROPIPOC 0.00    obtain serial ECG  to evaluate for ischemia as a cause of heart failure  monitor daily weight Admission weight 132.1 kg Last BNP BNP (last 3 results) Recent Labs    10/29/17 1840  BNP 539.2*      diurese with IV lasix and monitor orthostatics and creatinine to avoid over diuresis.  Order echogram to evaluate EF and valves  ACE/ARBi ordered    cardiology consulted (Email  . Essential hypertension - hold Norvasc as this can increase leg   edema . Hyperlipidemia  - stable continue home medications Leg edema right more than left. Slightly musculoskeletal in nature but given decreased ambulation will check Dopplers so not patient is therapeutic on Coumadin which is reassuring Other plan as per orders. History of seizures continue phenobarbital has been stable for the past  20 years  QT prolongation - - will monitor on tele avoid QT prolonging medications, rehydrate  correct electrolytes    DVT prophylaxis:  On Coumadin  Code Status:    DNR/DNI   as per patient   I had personally discussed CODE STATUS with patient and family   Family Communication:   Family   at  Bedside  plan of care was discussed with Wife   Disposition Plan:      To home once workup is complete and patient is stable                     Would benefit from PT/OT eval prior to DC recent fatigue and decrease with ambulation Ordered                 Consults called: email cardiology  Admission status:  Inpatient, she will be admitted for CHF appears to have significant fluid overload will likely require more than 2 midnights to fully diurese   Level of care      tele         Toy Baker 10/29/2017, 9:46 PM    [facility]  Pager [phone]   after 2 AM please page floor coverage PA If 7AM-7PM, please contact the day team taking care of the patient  [email or web address]  Password [email or web address]

## 2017-10-29 NOTE — ED Provider Notes (Signed)
[place] [facility] EMERGENCY DEPARTMENT Provider Note   CSN: [id number] Arrival date & time: 10/29/17  1725     History   Chief Complaint Chief Complaint  Patient presents with  . Atrial Fibrillation    HPI Ruben Murray is a 72 y.o. male.  HPI She presents to the emergency room for evaluation of shortness of breath.  Patient states he has been dealing with some ankle issues for several months.  He has been seeing an orthopedic doctor.  He has noticed that he has been getting short of breath with activity over the last several weeks but attributed that to his orthopedic issues.  This evening patient went out to dinner and when he got back home he felt extremely short of breath and fatigue.  Patient states he was wiped out.  He denied having any chest pain.  He denied any palpitations.  He has noticed some ankle swelling.  He called EMS this evening and they noted the patient was in atrial fibrillation.  According to the medical records, the patient does have a history of atrial fibrillation.  He was not too familiar with that. Past Medical History:  Diagnosis Date  . CAP (community acquired pneumonia) 03/06/2016  . Hyperlipidemia   . Hypertension   . Myocardial infarction [facility]) 1980-early 2000s X 2   "mild ones" (03/06/2016)  . OSA on CPAP    "not wearing it right now; need to replace parts" (03/06/2016)  . Pneumonia 02/2016  . Pneumonia    "when I was a kid" (03/06/2016)  . Seizures ([place])    "take RX daily" (03/06/2016)  This patients CHA2DS2-VASc Score and unadjusted Ischemic Stroke Rate (% per year) is equal to 2.2 % stroke rate/year from a score of 2  Above score calculated as 1 point each if present [CHF, HTN, DM, Vascular=MI/PAD/Aortic Plaque, Age if 65-74, or Male] Above score calculated as 2 points each if present [Age > 75, or Stroke/TIA/TE]   Patient Active Problem List   Diagnosis Date Noted  . Hoarseness 08/31/2016  . Moderate persistent asthma  08/02/2016  . COPD GOLD II  08/02/2016  . Morbid (severe) obesity due to excess calories ([place]) 08/02/2016  . CAP (community acquired pneumonia) 03/07/2016  . Community acquired pneumonia 03/06/2016  . Essential hypertension 03/06/2016  . Hyperlipidemia 03/06/2016  . Right thyroid nodule 03/06/2016  . Seizures ([place]) 03/06/2016  . Fever 03/12/2015  . 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 CIRCUMCISION    . JOINT REPLACEMENT    . PERCUTANEOUS PINNING TOE FRACTURE Left    "big toe  . REPLACEMENT TOTAL KNEE Left   . TONSILLECTOMY AND ADENOIDECTOMY          Home Medications    Prior to Admission medications   Medication Sig Start Date End Date Taking? Authorizing Provider  acetaminophen (TYLENOL) 500 MG tablet Take 1,000 mg by mouth every 6 (six) hours as needed (Pain).    [provider]  amLODipine (NORVASC) 5 MG tablet Take 5 mg by mouth daily. 09/26/16   [provider]  bisoprolol (ZEBETA) 5 MG tablet Take 1 tablet (5 mg total) by mouth daily. 08/02/16   Tanda Rockers, MD  docusate sodium (COLACE) 100 MG capsule Take 1 capsule (100 mg total) by mouth every 12 (twelve) hours. 07/22/16   Charlesetta Shanks, MD  famotidine (PEPCID) 20 MG tablet One at bedtime 08/02/16   Tanda Rockers, MD  losartan-hydrochlorothiazide (HYZAAR) 100-12.5 MG per tablet Take 0.5 tablets by mouth every morning.     [provider]  Multiple Vitamin (MULITIVITAMIN WITH MINERALS) TABS Take 1 tablet by mouth every morning.    [provider]  pantoprazole (PROTONIX) 40 MG tablet Take 40 mg by mouth 2 (two) times daily.    [provider]  PHENobarbital (LUMINAL) 97.2 MG tablet Take 97.2 mg by mouth every morning.    [provider]  polyethylene glycol (MIRALAX / GLYCOLAX) packet Take 17 g by mouth daily as needed. 03/10/16   Lavina Hamman, MD  pravastatin (PRAVACHOL) 40 MG tablet Take 40 mg by mouth  daily.    [provider]  warfarin (COUMADIN) 5 MG tablet Take 1 tablet by mouth daily. 07/31/16   [provider]    Family History Family History  Problem Relation Age of Onset  . Hypertension Mother   . Other Father        sudden cardiac arrest    Social History Social History   Tobacco Use  . Smoking status: Former Smoker    Packs/day: 3.00    Years: 48.00    Pack years: 144.00    Types: Cigarettes    Last attempt to quit: 2000    Years since quitting: 19.6  . Smokeless tobacco: Never Used  Substance Use Topics  . Alcohol use: No  . Drug use: No     Allergies   Dilaudid [hydromorphone hcl]; Morphine and related; and Tegretol [carbamazepine]   Review of Systems Review of Systems  All other systems reviewed and are negative.    Physical Exam Updated Vital Signs BP 121/80 (BP Location: Left Arm)   Pulse (!) 102   Temp 98.2 F (36.8 C) (Oral)   Resp 16   SpO2 95%   Physical Exam  HENT:  Head: Normocephalic and atraumatic.  Right Ear: External ear normal.  Left Ear: External ear normal.  Eyes: Conjunctivae are normal. Right eye exhibits no discharge. Left eye exhibits no discharge. No scleral icterus.  Neck: Neck supple. No tracheal deviation present.  Cardiovascular: Intact distal pulses. An irregularly irregular rhythm present. Tachycardia present.  Pulmonary/Chest: Effort normal and breath sounds normal. No stridor. No respiratory distress. He has no wheezes. He has no rales.  Abdominal: Soft. Bowel sounds are normal. He exhibits no distension. There is no tenderness. There is no rebound and no guarding.  Musculoskeletal: He exhibits edema. He exhibits no tenderness.  Neurological: He is alert. He has normal strength. No cranial nerve deficit (no facial droop, extraocular movements intact, no slurred speech) or sensory deficit. He exhibits normal muscle tone. He displays no seizure activity. Coordination normal.  Skin: Skin is warm and  dry. No rash noted. He is not diaphoretic.  Psychiatric: He has a normal mood and affect.  Nursing note and vitals reviewed.    ED Treatments / Results  Labs (all labs ordered are listed, but only abnormal results are displayed) Labs Reviewed  BASIC METABOLIC PANEL - Abnormal; Notable for the following components:      Result Value   Glucose, Bld 110 (*)    Calcium 8.8 (*)    All other components within normal limits  BRAIN NATRIURETIC PEPTIDE - Abnormal; Notable for the following components:   B Natriuretic Peptide 539.2 (*)    All other components within normal limits  PROTIME-INR - Abnormal; Notable for the following components:   Prothrombin Time 23.4 (*)    All other components  within normal limits  MAGNESIUM  CBC  I-STAT TROPONIN, ED    EKG EKG Interpretation  Date/Time:  Monday October 29 2017 17:42:57 EDT Ventricular Rate:  124 PR Interval:    QRS Duration: 140 QT Interval:  372 QTC Calculation: 534 R Axis:   -46 Text Interpretation:  Atrial fibrillation with rapid ventricular response with premature ventricular or aberrantly conducted complexes , new since last tracing Left axis deviation Right bundle branch block Abnormal ECG Confirmed by Dorie Rank [id number]) on 10/29/2017 5:57:14 PM   Radiology Dg Chest 2 View  Result Date: 10/29/2017 CLINICAL DATA:  RIGHT leg pain and swelling, shortness of breath and atrial fibrillation. EXAM: CHEST - 2 VIEW COMPARISON:  07/22/2016. FINDINGS: Cardiomegaly. No consolidation or edema. Mild scarring LEFT base. No effusion or pneumothorax. Bones unremarkable. IMPRESSION: Cardiomegaly increased from priors.  No active disease. Electronically Signed   By: Staci Righter M.D.   On: 10/29/2017 18:49    Procedures .Critical Care Performed by: Dorie Rank, MD Authorized by: Dorie Rank, MD   Critical care provider statement:    Critical care time (minutes):  35   Critical care was time spent personally by me on the following  activities:  Discussions with consultants, evaluation of patient's response to treatment, examination of patient, ordering and performing treatments and interventions, ordering and review of laboratory studies, ordering and review of radiographic studies, pulse oximetry, re-evaluation of patient's condition, obtaining history from patient or surrogate and review of old charts   (including critical care time)  Medications Ordered in ED Medications  furosemide (LASIX) injection 40 mg (has no administration in time range)  verapamil (ISOPTIN) injection 5 mg (5 mg Intravenous Given 10/29/17 1903)     Initial Impression / Assessment and Plan / ED Course  I have reviewed the triage vital signs and the nursing notes.  Pertinent labs & imaging results that were available during my care of the patient were reviewed by me and considered in my medical decision making (see chart for details).  Clinical Course as of Oct 29 2033  Mon Oct 29, 2017  1959 BNP elevated compared to previous.     [JK]  2035 Heart rate down into the low 90s   [JK]    Clinical Course User Index [JK] Dorie Rank, MD    Patient presented to the emergency room for evaluation of shortness of breath.  Patient was noted to be in atrial fibrillation at a rapid rate.  He was given a dose of verapamil IV with significant improvement in his heart rate.  Patient has a history of atrial fibrillation but it sounds like normally is in a sinus rhythm.  Patient has noticed increasing shortness of breath over the last couple weeks.  He certainly may have been in recurrent atrial fibrillation.  Patient also has an elevated BNP and his chest x-ray shows a peripheral edema on exam.  I suspect he has a component of congestive heart failure causing his shortness of breath.  I have ordered a dose of Lasix.  I will consult with the medical service for admission and further stabilization and treatment  Final Clinical Impressions(s) / ED Diagnoses    Final diagnoses:  Atrial fibrillation with RVR ([place])  Acute congestive heart failure, unspecified heart failure type [facility])      Dorie Rank, MD 10/29/17 2036

## 2017-10-30 ENCOUNTER — Inpatient Hospital Stay (HOSPITAL_COMMUNITY): Payer: Medicare HMO

## 2017-10-30 ENCOUNTER — Encounter (HOSPITAL_COMMUNITY): Payer: Self-pay | Admitting: Physician Assistant

## 2017-10-30 DIAGNOSIS — I5043 Acute on chronic combined systolic (congestive) and diastolic (congestive) heart failure: Secondary | ICD-10-CM

## 2017-10-30 DIAGNOSIS — I10 Essential (primary) hypertension: Secondary | ICD-10-CM

## 2017-10-30 DIAGNOSIS — M7989 Other specified soft tissue disorders: Secondary | ICD-10-CM

## 2017-10-30 DIAGNOSIS — I4891 Unspecified atrial fibrillation: Secondary | ICD-10-CM

## 2017-10-30 LAB — PROTIME-INR
INR: 2.29
PROTHROMBIN TIME: 25 s — AB (ref 11.4–15.2)

## 2017-10-30 LAB — CBC WITH DIFFERENTIAL/PLATELET
Abs Immature Granulocytes: 0 10*3/uL (ref 0.0–0.1)
Basophils Absolute: 0 10*3/uL (ref 0.0–0.1)
Basophils Relative: 1 %
EOS PCT: 2 %
Eosinophils Absolute: 0.2 10*3/uL (ref 0.0–0.7)
HEMATOCRIT: 41.4 % (ref 39.0–52.0)
Hemoglobin: 13.1 g/dL (ref 13.0–17.0)
Immature Granulocytes: 0 %
LYMPHS ABS: 2.5 10*3/uL (ref 0.7–4.0)
Lymphocytes Relative: 29 %
MCH: 29.4 pg (ref 26.0–34.0)
MCHC: 31.6 g/dL (ref 30.0–36.0)
MCV: 92.8 fL (ref 78.0–100.0)
MONO ABS: 0.8 10*3/uL (ref 0.1–1.0)
MONOS PCT: 9 %
Neutro Abs: 5.1 10*3/uL (ref 1.7–7.7)
Neutrophils Relative %: 59 %
Platelets: 149 10*3/uL — ABNORMAL LOW (ref 150–400)
RBC: 4.46 MIL/uL (ref 4.22–5.81)
RDW: 14.6 % (ref 11.5–15.5)
WBC: 8.6 10*3/uL (ref 4.0–10.5)

## 2017-10-30 LAB — ECHOCARDIOGRAM COMPLETE
Height: 68 in
Weight: 4596.8 oz

## 2017-10-30 LAB — COMPREHENSIVE METABOLIC PANEL
ALBUMIN: 3.4 g/dL — AB (ref 3.5–5.0)
ALT: 19 U/L (ref 0–44)
AST: 20 U/L (ref 15–41)
Alkaline Phosphatase: 96 U/L (ref 38–126)
Anion gap: 11 (ref 5–15)
BUN: 18 mg/dL (ref 8–23)
CHLORIDE: 105 mmol/L (ref 98–111)
CO2: 27 mmol/L (ref 22–32)
CREATININE: 0.97 mg/dL (ref 0.61–1.24)
Calcium: 8.4 mg/dL — ABNORMAL LOW (ref 8.9–10.3)
GFR calc Af Amer: >60 mL/min (ref >60)
GLUCOSE: 103 mg/dL — AB (ref 70–99)
POTASSIUM: 3.4 mmol/L — AB (ref 3.5–5.1)
SODIUM: 143 mmol/L (ref 135–145)
Total Bilirubin: 1 mg/dL (ref 0.3–1.2)
Total Protein: 5.9 g/dL — ABNORMAL LOW (ref 6.5–8.1)

## 2017-10-30 LAB — TSH: TSH: 1.469 u[IU]/mL (ref 0.350–4.500)

## 2017-10-30 LAB — TROPONIN I
Troponin I: <0.03 ng/mL (ref <0.03)
Troponin I: <0.03 ng/mL (ref <0.03)
Troponin I: <0.03 ng/mL (ref <0.03)

## 2017-10-30 LAB — PHOSPHORUS: PHOSPHORUS: 3.7 mg/dL (ref 2.5–4.6)

## 2017-10-30 LAB — MAGNESIUM: MAGNESIUM: 1.8 mg/dL (ref 1.7–2.4)

## 2017-10-30 MED ORDER — WARFARIN SODIUM 2 MG PO TABS
4.0000 mg | ORAL_TABLET | Freq: Once | ORAL | Status: AC
  Administered 2017-10-30: 4 mg via ORAL
  Filled 2017-10-30: qty 2

## 2017-10-30 MED ORDER — WARFARIN - PHARMACIST DOSING INPATIENT
Freq: Every day | Status: DC
  Administered 2017-10-30 – 2017-10-31 (×2)

## 2017-10-30 MED ORDER — DIGOXIN 0.25 MG/ML IJ SOLN: 0.2500 mg | Freq: Four times a day (QID) | INTRAMUSCULAR | Status: DC

## 2017-10-30 MED ORDER — DIGOXIN 0.25 MG/ML IJ SOLN
0.2500 mg | Freq: Four times a day (QID) | INTRAMUSCULAR | Status: AC
  Administered 2017-10-30: 0.25 mg via INTRAVENOUS

## 2017-10-30 MED ORDER — PERFLUTREN LIPID MICROSPHERE
1.0000 mL | INTRAVENOUS | Status: AC | PRN
  Administered 2017-10-30: 3 mL via INTRAVENOUS
  Filled 2017-10-30 (×2): qty 10

## 2017-10-30 MED ORDER — LIVING BETTER WITH HEART FAILURE BOOK
Freq: Once | Status: AC
  Administered 2017-10-30: 17:00:00

## 2017-10-30 MED ORDER — DIGOXIN 0.25 MG/ML IJ SOLN
0.2500 mg | Freq: Every day | INTRAMUSCULAR | Status: DC
  Administered 2017-11-01: 0.25 mg via INTRAVENOUS
  Filled 2017-10-30: qty 2

## 2017-10-30 MED ORDER — DIGOXIN 0.25 MG/ML IJ SOLN
0.5000 mg | INTRAMUSCULAR | Status: AC
  Administered 2017-10-30: 0.5 mg via INTRAVENOUS
  Filled 2017-10-30: qty 2

## 2017-10-30 MED ORDER — DIGOXIN 0.25 MG/ML IJ SOLN: 0.5000 mg | INTRAMUSCULAR | Status: DC

## 2017-10-30 MED ORDER — DIGOXIN 0.25 MG/ML IJ SOLN: 0.2500 mg | Freq: Every day | INTRAMUSCULAR | Status: DC

## 2017-10-30 MED ORDER — POTASSIUM CHLORIDE CRYS ER 20 MEQ PO TBCR
40.0000 meq | EXTENDED_RELEASE_TABLET | Freq: Every day | ORAL | Status: DC
  Administered 2017-10-31: 40 meq via ORAL
  Filled 2017-10-30: qty 2

## 2017-10-30 MED ORDER — DIGOXIN 0.25 MG/ML IJ SOLN
0.2500 mg | Freq: Four times a day (QID) | INTRAMUSCULAR | Status: AC
  Administered 2017-10-31: 0.25 mg via INTRAVENOUS
  Filled 2017-10-30 (×2): qty 2

## 2017-10-30 MED ORDER — POTASSIUM CHLORIDE CRYS ER 20 MEQ PO TBCR
40.0000 meq | EXTENDED_RELEASE_TABLET | ORAL | Status: AC
  Administered 2017-10-30 (×2): 40 meq via ORAL
  Filled 2017-10-30 (×2): qty 2

## 2017-10-30 MED ORDER — TAMSULOSIN HCL 0.4 MG PO CAPS
0.4000 mg | ORAL_CAPSULE | Freq: Every day | ORAL | Status: DC
  Administered 2017-10-30 – 2017-11-03 (×5): 0.4 mg via ORAL
  Filled 2017-10-30 (×5): qty 1

## 2017-10-30 MED ORDER — FUROSEMIDE 10 MG/ML IJ SOLN
40.0000 mg | Freq: Every day | INTRAMUSCULAR | Status: DC
  Administered 2017-10-30 – 2017-10-31 (×2): 40 mg via INTRAVENOUS
  Filled 2017-10-30 (×2): qty 4

## 2017-10-30 NOTE — Progress Notes (Signed)
RLE venous duplex prelim: negative for DVT.  Landry Mellow, RDMS, RVT

## 2017-10-30 NOTE — Progress Notes (Addendum)
ANTICOAGULATION CONSULT NOTE - Initial Consult  Pharmacy Consult for warfarin Indication: atrial fibrillation  Allergies  Allergen Reactions  . Dilaudid [Hydromorphone Hcl] Other (See Comments)    Reaction:  Makes pt hyper   . Morphine And Related Other (See Comments)    Pt states that this medication makes him feel like he is out of it.    . Tegretol [Carbamazepine] Hives    Patient Measurements: Height: 5' 8" (172.7 cm) Weight: 293 lb 8 oz (133.1 kg) IBW/kg (Calculated) : 68.4 Heparin Dosing Weight: n/a   Vital Signs: Temp: 97.4 F (36.3 C) (08/26 2202) Temp Source: Oral (08/26 2202) BP: 119/83 (08/26 2202) Pulse Rate: 99 (08/26 2202)  Labs: Recent Labs    10/29/17 1840  HGB 14.1  HCT 45.2  PLT 165  LABPROT 23.4*  INR 2.10  CREATININE 0.94    Estimated Creatinine Clearance: 92 mL/min (by C-G formula based on SCr of 0.94 mg/dL).   Medical History: Past Medical History:  Diagnosis Date  . CAP (community acquired pneumonia) 03/06/2016  . Hyperlipidemia   . Hypertension   . Myocardial infarction [facility]) 1980-early 2000s X 2   "mild ones" (03/06/2016)  . OSA on CPAP    "not wearing it right now; need to replace parts" (03/06/2016)  . Pneumonia 02/2016  . Pneumonia    "when I was a kid" (03/06/2016)  . Seizures ([place])    "take RX daily" (03/06/2016)    Medications:  Medications Prior to Admission  Medication Sig Dispense Refill Last Dose  . acetaminophen (TYLENOL) 500 MG tablet Take 1,000 mg by mouth every 6 (six) hours as needed (Pain).   Taking  . amLODipine (NORVASC) 5 MG tablet Take 5 mg by mouth daily.   Taking  . bisoprolol (ZEBETA) 5 MG tablet Take 1 tablet (5 mg total) by mouth daily. 30 tablet 11 Taking  . docusate sodium (COLACE) 100 MG capsule Take 1 capsule (100 mg total) by mouth every 12 (twelve) hours. 60 capsule 0 Taking  . famotidine (PEPCID) 20 MG tablet One at bedtime 30 tablet 2 Taking  . losartan-hydrochlorothiazide (HYZAAR) 100-12.5 MG per  tablet Take 0.5 tablets by mouth every morning.    Taking  . Multiple Vitamin (MULITIVITAMIN WITH MINERALS) TABS Take 1 tablet by mouth every morning.   Taking  . pantoprazole (PROTONIX) 40 MG tablet Take 40 mg by mouth 2 (two) times daily.   Taking  . PHENobarbital (LUMINAL) 97.2 MG tablet Take 97.2 mg by mouth every morning.   Taking  . polyethylene glycol (MIRALAX / GLYCOLAX) packet Take 17 g by mouth daily as needed. 14 each 0 Taking  . pravastatin (PRAVACHOL) 40 MG tablet Take 40 mg by mouth daily.   Taking  . warfarin (COUMADIN) 5 MG tablet Take 1 tablet by mouth daily.   Taking    Assessment: 65 YOM with a h/o of Afib on warfarin presents with shortness of breath and chest pain. Pharmacy consulted to resume home warfarin therapy. INR today is low therapeutic at 2.1. H/H and Plt wnl  Dose prior to admission = 5 mg Sunday and Wednesday, 4 mg other days  Goal of Therapy:  INR 2-3 Monitor platelets by anticoagulation protocol: Yes   Plan:  Warfarin 4 mg po x 1 dose tonight at 1800 pm Daily INR  Thank you Anette Guarneri, PharmD [phone]

## 2017-10-30 NOTE — Progress Notes (Signed)
PROGRESS NOTE  Ruben Murray:[id number] DOB: 04-13-1945 DOA: 10/29/2017 PCP: [name], MD   LOS: 1 day   Brief Narrative / Interim history: 72 year old male with history of A. fib on Coumadin, OSA, systolic CHF with an EF of 40 to 45%, seizure disorder who presented to the hospital with shortness of breath and intermittent chest pain for the past couple weeks.  He also has noticed bilateral lower extremity swelling as well as increased abdominal distention.  On admission he was found to have fluid overload, and be in A. fib with RVR  Assessment & Plan: Active Problems:   Essential hypertension   Hyperlipidemia   Seizures (HCC)   Atrial fibrillation with RVR (HCC)   Acute on chronic combined systolic and diastolic CHF (congestive heart failure) (HCC)   CHF exacerbation (HCC)   Prolonged QT interval   A fib with RVR -Continue his home beta-blocker, suspect diuresis will improve rates as well -Cardiology consulted, appreciate input  Acute on chronic combined CHF -Possibly in the setting of poorly controlled A. fib, started on IV Lasix, already feels improved this morning -He has gained about 9 pounds at home weighing 293 on admission, he is to 87 this morning.  Continue IV Lasix -Given asymmetric lower extremity swelling Dopplers were done on admission, negative for DVT.    Arthritis -Status post total knee replacement on the left by Dr. Gladstone Lighter, as well as right ankle osteoarthritis followed by Dr. Doran Durand, not a candidate for surgery  Hypertension -Continue bisoprolol, furosemide, losartan  Seizure disorder -Continue phenobarbital   DVT prophylaxis: Coumadin Code Status: DNR Family Communication: no family at bedside  Disposition Plan: home when ready   Consultants:   Cardiology   Procedures:   2D echo: pending   Antimicrobials:  none   Subjective: -Feels better, less short of breath and last night.  Still appreciates lower extremity  swelling.  Objective: Vitals:   10/30/17 0422 10/30/17 0744 10/30/17 1000 10/30/17 1224  BP: (!) 123/92  116/86 92/66  Pulse: 100 92  (!) 112  Resp: _0 Temp: (!) 97.5 F (36.4 C)  97.8 F (36.6 C) (!) 97.4 F (36.3 C)  TempSrc: Oral  Oral Oral  SpO2: 98% 95% 92% 94%  Weight: 130.3 kg     Height:        Intake/Output Summary (Last 24 hours) at 10/30/2017 1404 Last data filed at 10/30/2017 1245 Gross per 24 hour  Intake 480 ml  Output 1700 ml  Net -1220 ml   Filed Weights   10/29/17 2100 10/29/17 2202 10/30/17 0422  Weight: 128.8 kg 133.1 kg 130.3 kg    Examination:  Constitutional: NAD, eating breakfast Eyes: PERRL, lids and conjunctivae normal ENMT: Mucous membranes are moist.  Neck: normal, supple Respiratory: clear to auscultation bilaterally, no wheezing, faint crackles. Normal respiratory effort. No accessory muscle use.  Cardiovascular: Regular rate and rhythm, no murmurs / rubs / gallops.  1-2+ LE edema. Abdomen: no tenderness. Bowel sounds positive.   Skin: no rashes Neurologic: Nonfocal Psychiatric: Normal judgment and insight. Alert and oriented x 3. Normal mood.    Data Reviewed: I have independently reviewed following labs and imaging studies   CBC: Recent Labs  Lab 10/29/17 1840 10/30/17 0552  WBC 8.8 8.6  NEUTROABS  --  5.1  HGB 14.1 13.1  HCT 45.2 41.4  MCV 94.6 92.8  PLT 165 993*   Basic Metabolic Panel: Recent Labs  Lab 10/29/17 1840 10/30/17 0552  NA  143 143  K 3.8 3.4*  CL 106 105  CO2 28 27  GLUCOSE 110* 103*  BUN 19 18  CREATININE 0.94 0.97  CALCIUM 8.8* 8.4*  MG 1.9 1.8  PHOS  --  3.7   GFR: Estimated Creatinine Clearance: 88.1 mL/min (by C-G formula based on SCr of 0.97 mg/dL). Liver Function Tests: Recent Labs  Lab 10/30/17 0552  AST 20  ALT 19  ALKPHOS 96  BILITOT 1.0  PROT 5.9*  ALBUMIN 3.4*   No results for input(s): LIPASE, AMYLASE in the last 168 hours. No results for input(s): AMMONIA in the  last 168 hours. Coagulation Profile: Recent Labs  Lab 10/29/17 1840 10/30/17 0552  INR 2.10 2.29   Cardiac Enzymes: Recent Labs  Lab 10/30/17 0023 10/30/17 0552 10/30/17 1152  TROPONINI <0.03 <0.03 <0.03   BNP (last 3 results) No results for input(s): PROBNP in the last 8760 hours. HbA1C: No results for input(s): HGBA1C in the last 72 hours. CBG: No results for input(s): GLUCAP in the last 168 hours. Lipid Profile: No results for input(s): CHOL, HDL, LDLCALC, TRIG, CHOLHDL, LDLDIRECT in the last 72 hours. Thyroid Function Tests: Recent Labs    10/30/17 0023  TSH 1.469   Anemia Panel: No results for input(s): VITAMINB12, FOLATE, FERRITIN, TIBC, IRON, RETICCTPCT in the last 72 hours. Urine analysis:    Component Value Date/Time   COLORURINE YELLOW 03/06/2016 0740   APPEARANCEUR CLEAR 03/06/2016 0740   LABSPEC 1.033 (H) 03/06/2016 0740   PHURINE 5.0 03/06/2016 0740   GLUCOSEU NEGATIVE 03/06/2016 0740   HGBUR SMALL (A) 03/06/2016 0740   BILIRUBINUR NEGATIVE 03/06/2016 0740   KETONESUR 5 (A) 03/06/2016 0740   PROTEINUR 30 (A) 03/06/2016 0740   UROBILINOGEN 0.2 05/06/2010 1010   NITRITE NEGATIVE 03/06/2016 0740   LEUKOCYTESUR NEGATIVE 03/06/2016 0740   Sepsis Labs: Invalid input(s): PROCALCITONIN, LACTICIDVEN  No results found for this or any previous visit (from the past 240 hour(s)).    Radiology Studies: Dg Chest 2 View  Result Date: 10/29/2017 CLINICAL DATA:  RIGHT leg pain and swelling, shortness of breath and atrial fibrillation. EXAM: CHEST - 2 VIEW COMPARISON:  07/22/2016. FINDINGS: Cardiomegaly. No consolidation or edema. Mild scarring LEFT base. No effusion or pneumothorax. Bones unremarkable. IMPRESSION: Cardiomegaly increased from priors.  No active disease. Electronically Signed   By: Staci Righter M.D.   On: 10/29/2017 18:49     Scheduled Meds: . bisoprolol  5 mg Oral Daily  . furosemide  40 mg Intravenous Daily  . losartan  12.5 mg Oral Daily   . mouth rinse  15 mL Mouth Rinse BID  . pantoprazole  40 mg Oral BID  . PHENobarbital  97.2 mg Oral q morning - 10a  . sodium chloride flush  3 mL Intravenous Q12H  . warfarin  4 mg Oral ONCE-1800  . Warfarin - Pharmacist Dosing Inpatient   Does not apply q1800   Continuous Infusions: . sodium chloride      Marzetta Board, MD, PhD [facility] Pager [phone]  If 7PM-7AM, please contact night-coverage [email or web address] Password [facility] 10/30/2017, 2:04 PM

## 2017-10-30 NOTE — Evaluation (Signed)
Occupational Therapy Evaluation Patient Details Name: Ruben Murray MRN: [id number] DOB: 1946/02/20 Today's Date: 10/30/2017    History of Present Illness 72 yo with SoB and edema with CHF exacerbation. PMHx: seizure d/o, atrial fibrillation on coumadin , sleep apnea, Systolic CHF, HTN, Lt TKA, pt reports Rt weak ankle awaiting a brace   Clinical Impression   PTA, pt was living his wife and was independent and using [facility] for functional mobility. Pt currently requiring Min guard A for ADLs in standing, LB ADLs, and functional mobility with RW. Pt initially requiring Min A for RW management during mobility and progressing to Min guard with VCs for bringing RW closer. Pt requiring with decreased balance, strength, and activity tolerance. HR elevating to 176; RN notified. Pt would benefit from further acute OT to facilitate safe dc. Recommend dc to home with HHOT for further OT to optimize safety, independence with ADLs, and return to PLOF.      Follow Up Recommendations  Home health OT;Supervision/Assistance - 24 hour    Equipment Recommendations  3 in 1 bedside commode    Recommendations for Other Services PT consult     Precautions / Restrictions Precautions Precautions: Fall Restrictions Weight Bearing Restrictions: No      Mobility Bed Mobility                  Transfers Overall transfer level: Needs assistance Equipment used: Rolling walker (2 wheeled) Transfers: Sit to/from Stand Sit to Stand: Min guard         General transfer comment: Min Guard A for safety    Balance Overall balance assessment: Mild deficits observed, not formally tested                                         ADL either performed or assessed with clinical judgement   ADL Overall ADL's : Needs assistance/impaired Eating/Feeding: Set up;Sitting   Grooming: Min guard;Standing   Upper Body Bathing: Supervision/ safety;Set up;Sitting   Lower Body Bathing: Min  guard;Sit to/from stand   Upper Body Dressing : Set up;Supervision/safety;Sitting   Lower Body Dressing: Min guard;Sit to/from stand Lower Body Dressing Details (indicate cue type and reason): Pt able to bring ankles to knees with increased effort. Pt requiring Min Guard A for safety in standing.  Toilet Transfer: Min guard;Ambulation;RW(Simulated to recliner) Armed forces technical officer Details (indicate cue type and reason): Pt requiring Min Guard A for safety and increased weight shift to power up into standing. Discussed use of 3N1 over toilet to increase independence at home especially with recent RLE ankle injury         Functional mobility during ADLs: Min guard;Minimal assistance;Rolling walker(Min A for initital management of RW; progressing to MinGuard) General ADL Comments: Pt with decreased activity tolerance. HR elevating to 174 during functional mobility. RN notified.     Vision         Perception     Praxis      Pertinent Vitals/Pain Pain Assessment: No/denies pain     Hand Dominance     Extremity/Trunk Assessment Upper Extremity Assessment Upper Extremity Assessment: Generalized weakness   Lower Extremity Assessment Lower Extremity Assessment: Generalized weakness   Cervical / Trunk Assessment Cervical / Trunk Assessment: Kyphotic;Other exceptions Cervical / Trunk Exceptions: Increased body habitus   Communication Communication Communication: No difficulties   Cognition Arousal/Alertness: Awake/alert Behavior During Therapy: WFL for tasks assessed/performed Overall  Cognitive Status: Within Functional Limits for tasks assessed                                     General Comments  Wife present throughout session    Exercises     Shoulder Instructions      [place] expects to be discharged to:: Private residence Living Arrangements: Spouse/significant other Available Help at Discharge: Family;Available 24 hours/day Type of  Home: Apartment Home Access: Elevator     Home Layout: One level     Bathroom Shower/Tub: Occupational psychologist: Standard     Home Equipment: Shower seat;Grab bars - tub/shower;Grab bars - toilet;Cane - single point;Walker - 2 wheels;Other (comment)(Lift chair)   Additional Comments: sleeps in a recliner      Prior Functioning/Environment Level of Independence: Independent        Comments: Uses SPC for functional mobility        OT Problem List: Decreased strength;Decreased range of motion;Decreased activity tolerance;Impaired balance (sitting and/or standing);Decreased knowledge of use of DME or AE;Decreased knowledge of precautions;Decreased safety awareness;Cardiopulmonary status limiting activity;Increased edema      OT Treatment/Interventions: Self-care/ADL training;Therapeutic exercise;Energy conservation;DME and/or AE instruction;Therapeutic activities;Patient/family education    OT Goals(Current goals can be found in the care plan section) Acute Rehab OT Goals Patient Stated Goal: "Sing at church" OT Goal Formulation: With patient/family Time For Goal Achievement: 11/13/17 Potential to Achieve Goals: Good ADL Goals Pt Will Perform Grooming: with modified independence;standing Pt Will Perform Lower Body Dressing: with modified independence;sitting/lateral leans Pt Will Transfer to Toilet: with modified independence;bedside commode;ambulating(over toilet) Pt Will Perform Toileting - Clothing Manipulation and hygiene: with modified independence;sit to/from stand Pt Will Perform Tub/Shower Transfer: Shower transfer;ambulating;rolling walker;shower seat;with set-up;with supervision  OT Frequency: Min 2X/week   Barriers to D/C:            Co-evaluation              AM-PAC PT "6 Clicks" Daily Activity     Outcome Measure Help from another person eating meals?: None Help from another person taking care of personal grooming?: A Little Help from  another person toileting, which includes using toliet, bedpan, or urinal?: A Little Help from another person bathing (including washing, rinsing, drying)?: A Little Help from another person to put on and taking off regular upper body clothing?: None Help from another person to put on and taking off regular lower body clothing?: A Little 6 Click Score: 20   End of Session Equipment Utilized During Treatment: Gait belt;Rolling walker Nurse Communication: Mobility status;Other (comment)(HR)  Activity Tolerance: Patient tolerated treatment well Patient left: in chair;with call bell/phone within reach;with chair alarm set;with nursing/sitter in room;Other (comment)(with MD)  OT Visit Diagnosis: Unsteadiness on feet (R26.81);Other abnormalities of gait and mobility (R26.89);Muscle weakness (generalized) (M62.81)                Time: 1980-2217 OT Time Calculation (min): 22 min Charges:  OT General Charges $OT Visit: 1 Visit OT Evaluation $OT Eval Moderate Complexity: [place], OTR/L Acute Rehab Pager: [phone] Office: [place] 10/30/2017, 4:38 PM

## 2017-10-30 NOTE — Progress Notes (Signed)
To the best of my knowledge, documentation by G Wilson, [employer] nursing student is correct.  

## 2017-10-30 NOTE — Progress Notes (Signed)
OT Cancellation    10/30/17 1300  OT Visit Information  Last OT Received On 10/30/17  Reason Eval/Treat Not Completed Patient at procedure or test/ unavailable (Vascular Lab. Will return as schedule allows. Thank you)   [place], OTR/L Acute Rehab Pager: [phone] Office: [phone]

## 2017-10-30 NOTE — Progress Notes (Signed)
Patient reports having difficulty starting urine stream just recently.  Talked to patient and wife- reported he was placed on flomax but he stopped himself because he was urinating to much.  Now he reports he is having difficulty starting and he does not feel like he is finishing.   Pt did have to be in&out last night.  Paged MD

## 2017-10-30 NOTE — Evaluation (Addendum)
Physical Therapy Evaluation Patient Details Name: Ruben Murray MRN: [id number] DOB: 11-29-1945 Today's Date: 10/30/2017   History of Present Illness  72 yo with SoB and edema with CHF exacerbation. PMHx: seizure d/o, atrial fibrillation on coumadin , sleep apnea, Systolic CHF, HTN, Lt TKA, pt reports Rt weak ankle awaiting a brace  Clinical Impression  Pt pleasant and reports living in a 3rd floor apartment with elevator, does not wear oxygen at home and is active at church. Pt walks with a cane normally and is not a fan of a walker of any sort and does not follow commands for proper use of RW. Pt with instability of right ankle causing falls at times. PT with decreased activity tolerance, transfers and strength who will benefit from acute therapy to maximize mobility safety and function to decrease burden of care. PT activity limited by aFib today with HR up to 160.  SpO2 88-94% on RA with gait with cues for pursed lip breathing    Follow Up Recommendations Home health PT    Equipment Recommendations  None recommended by PT    Recommendations for Other Services       Precautions / Restrictions Precautions Precautions: None      Mobility  Bed Mobility Overal bed mobility: Modified Independent             General bed mobility comments: HOB 40degrees with pt able to pivot to edge without assist  Transfers Overall transfer level: Needs assistance   Transfers: Sit to/from Stand Sit to Stand: Supervision         General transfer comment: cues for hand placement to push from surface  Ambulation/Gait Ambulation/Gait assistance: Supervision Gait Distance (Feet): 350 Feet Assistive device: Rolling walker (2 wheeled) Gait Pattern/deviations: Step-through pattern;Wide base of support;Trunk flexed   Gait velocity interpretation: >2.62 ft/sec, indicative of community ambulatory General Gait Details: cues to step into RW and for pursed lip breathing. Gait limited by HR in  Afib jumping to 160 and chair pulled to pt with pt asymptomatic. HR 118 after sitting in chair. Pt maintains self posterior to RW despite cues and states he will not use RW at home. Pt walked 20' without AD end of session with wide BOS but no LOB  Stairs            Wheelchair Mobility    Modified Rankin (Stroke Patients Only)       Balance Overall balance assessment: Mild deficits observed, not formally tested                                           Pertinent Vitals/Pain Pain Assessment: No/denies pain    Home Living Family/patient expects to be discharged to:: Private residence Living Arrangements: Spouse/significant other   Type of Home: Apartment Home Access: Elevator     Home Layout: One level Home Equipment: Shower seat;Grab bars - tub/shower;Grab bars - toilet;Cane - single point;Walker - 2 wheels;Other (comment)(lift chair) Additional Comments: sleeps in a recliner    Prior Function Level of Independence: Independent               Hand Dominance        Extremity/Trunk Assessment   Upper Extremity Assessment Upper Extremity Assessment: Generalized weakness    Lower Extremity Assessment Lower Extremity Assessment: Generalized weakness    Cervical / Trunk Assessment Cervical / Trunk Assessment: Kyphotic  Communication  Communication: No difficulties  Cognition Arousal/Alertness: Awake/alert Behavior During Therapy: WFL for tasks assessed/performed Overall Cognitive Status: Within Functional Limits for tasks assessed                                        General Comments      Exercises     Assessment/Plan    PT Assessment Patient needs continued PT services  PT Problem List Decreased strength;Decreased mobility;Decreased safety awareness;Decreased activity tolerance;Cardiopulmonary status limiting activity;Decreased balance;Decreased knowledge of use of DME       PT Treatment Interventions Gait  training;Therapeutic exercise;Patient/family education;Balance training;Functional mobility training;DME instruction;Therapeutic activities    PT Goals (Current goals can be found in the Care Plan section)  Acute Rehab PT Goals Patient Stated Goal: sing at church PT Goal Formulation: With patient Time For Goal Achievement: 11/13/17 Potential to Achieve Goals: Good    Frequency Min 3X/week   Barriers to discharge Decreased caregiver support      Co-evaluation               AM-PAC PT "6 Clicks" Daily Activity  Outcome Measure Difficulty turning over in bed (including adjusting bedclothes, sheets and blankets)?: A Little Difficulty moving from lying on back to sitting on the side of the bed? : A Little Difficulty sitting down on and standing up from a chair with arms (e.g., wheelchair, bedside commode, etc,.)?: A Little Help needed moving to and from a bed to chair (including a wheelchair)?: A Little Help needed walking in hospital room?: A Little Help needed climbing 3-5 steps with a railing? : A Lot 6 Click Score: 17    End of Session Equipment Utilized During Treatment: Gait belt Activity Tolerance: Patient tolerated treatment well Patient left: with call bell/phone within reach;in chair;with chair alarm set Nurse Communication: Mobility status PT Visit Diagnosis: Other abnormalities of gait and mobility (R26.89);Muscle weakness (generalized) (M62.81)    Time: 4920-1007 PT Time Calculation (min) (ACUTE ONLY): 31 min   Charges:   PT Evaluation $PT Eval Moderate Complexity: 1 Mod PT Treatments $Gait Training: 8-22 mins        Elwyn Reach, PT [phone]   Sandy Salaam Haidynn Almendarez 10/30/2017, 8:13 AM

## 2017-10-30 NOTE — Progress Notes (Signed)
CCMD called- patient's HR jumped up into 180s Check on pt- he was ambulating in room to bathroom.  Asymptomatic

## 2017-10-30 NOTE — Progress Notes (Addendum)
Cardiology Consultation:   Patient ID: JEQUAN SHAHIN; [id number]; [birth year]   Admit date: 10/29/2017 Date of Consult: 10/30/2017  Primary Care Provider: [name], MD Primary Cardiologist: [name], MD  Chief Complaint: shortness of breath   Patient Profile:   ADEL BURCH is a 72 y.o. male with a hx of Chronic combined CHF, paroxysmal atrial fib (on Coumadin), HTN, HLD, seizure disorder, aorta atherosclerosis on prior CT scan, morbid obesity, OSA (was told didn't need CPAP), RBBB who is being seen today for the evaluation of atrial fib/CHF at the request of Dr. Cruzita Lederer.  History of Present Illness:   In 03/2016 he was hospitalized for PNA/bronchitis and ended up likely going into atrial fibrillation sometime after that point. 2D echocardiogram 04/2016 had shown EF 40-45%, grade 1 DD, moderate RAE, moderate TR, PASP 55mHg, elevated CVP. He then saw DRoderic Palauin the atrial fibrillation clinic. He was started on diltiazem and converted to NSR with this. Warfarin was also started. DOAC has not been used due to interaction with his chronic phenobarbital. Diltiazem was eventually changed to beta blocker given his LV dysfunction. The etiology of his cardiomyopathy is not known. It was felt possibly related to afib, but could not exclude coronary disease. Since he was not having chest pain, Dr. SMarlou Porchrecommended medical therapy and reassessment at a later time. It does not appear the repeat echo has been arranged. Last OV 05/2016. At some point he was told by Dr. OMaxwell Caul(with whom he last saw within the last 6 months) that he he would not require CPAP so long as continues to sleep in an recliner which he does for his back.  His INR is followed by Dr. RAlyson Ingles(PCP). He reports it was last checked 4 weeks ago and was within range so next check was due tomorrow actually. He and his wife eat out more than they eat in, [place] They know he loves to drink tea so  they bring him out a pitcher instead of just a cup when he goes. He does not limit fluid intake. For the last 3 weeks he has noticed worsening SOB. Initially he thought it was due to doing less activity (he has chronic L knee problems so tends to favor the R side, but has been having issues with the R ankle). However, it has progressed, along with development of lower extremity edema and abdominal distention as well. He has had gradual weight gain over the years so difficult to know any abrupt change. Due to persistent symptoms he came to the ER where he was found to be in rapid atrial fibrillation and also with an elevated BNP. Labs show negative troponin x 4, K 3.8->3.4, albumin 3.4, BNP 539, CBC wnl, INR 2.1-2.29, TSH wnl. CXR cardiomegaly increased from prior. He received 486mIV Lasix last night and 4032mhis AM. He was given a dose of 5mg13mrapamil last night. Bisoprolol was continued this AM. HR remains 100-140, variable. With diuresis he feels much better though - Admit wt 293, down to 287 today. BP is somewhat soft at 92/66 this afternoon. He has rare episodic fleeting chest pains less than once a week lasting seconds but has not had any significant episodes recently. RLE venous duplex done for asymmetric edema was negative for DVT.  Past Medical History:  Diagnosis Date  . CAP (community acquired pneumonia) 03/06/2016  . Hyperlipidemia   . Hypertension   . Myocardial infarction (HCC)[place]80-early 2000s X 2   "  mild ones" (03/06/2016)  . OSA on CPAP    "not wearing it right now; need to replace parts" (03/06/2016)  . Pneumonia 02/2016  . Pneumonia    "when I was a kid" (03/06/2016)  . Seizures ([place])    "take RX daily" (03/06/2016)    Past Surgical History:  Procedure Laterality Date  . CIRCUMCISION    . JOINT REPLACEMENT    . PERCUTANEOUS PINNING TOE FRACTURE Left    "big toe  . REPLACEMENT TOTAL KNEE Left   . TONSILLECTOMY AND ADENOIDECTOMY       Inpatient Medications: Scheduled Meds: .  bisoprolol  5 mg Oral Daily  . furosemide  40 mg Intravenous Daily  . losartan  12.5 mg Oral Daily  . mouth rinse  15 mL Mouth Rinse BID  . pantoprazole  40 mg Oral BID  . PHENobarbital  97.2 mg Oral q morning - 10a  . sodium chloride flush  3 mL Intravenous Q12H  . warfarin  4 mg Oral ONCE-1800  . Warfarin - Pharmacist Dosing Inpatient   Does not apply q1800   Continuous Infusions: . sodium chloride     PRN Meds: sodium chloride, acetaminophen **OR** acetaminophen, polyethylene glycol, sodium chloride flush  Home Meds: Prior to Admission medications   Medication Sig Start Date End Date Taking? Authorizing Provider  acetaminophen (TYLENOL) 500 MG tablet Take 1,000 mg by mouth every 6 (six) hours as needed (Pain).   Yes [provider]  amLODipine (NORVASC) 5 MG tablet Take 5 mg by mouth daily. 09/26/16  Yes [provider]  bisoprolol (ZEBETA) 5 MG tablet Take 1 tablet (5 mg total) by mouth daily. 08/02/16  Yes Tanda Rockers, MD  famotidine (PEPCID) 20 MG tablet One at bedtime Patient taking differently: Take 20 mg by mouth at bedtime. One at bedtime 08/02/16  Yes Tanda Rockers, MD  fluticasone [facility]) 50 MCG/ACT nasal spray Place 2 sprays into both nostrils 2 (two) times daily. 10/24/17  Yes [provider]  losartan-hydrochlorothiazide (HYZAAR) 100-12.5 MG per tablet Take 0.5 tablets by mouth every morning.    Yes [provider]  pantoprazole (PROTONIX) 40 MG tablet Take 40 mg by mouth 2 (two) times daily.   Yes [provider]  PHENobarbital (LUMINAL) 97.2 MG tablet Take 97.2 mg by mouth every morning.   Yes [provider]  polyethylene glycol (MIRALAX / GLYCOLAX) packet Take 17 g by mouth daily as needed. 03/10/16  Yes Lavina Hamman, MD  pravastatin (PRAVACHOL) 40 MG tablet Take 40 mg by mouth daily.   Yes [provider]  warfarin (COUMADIN) 4 MG tablet Take 4 mg by mouth See admin instructions.  Mon/Tues/Thurs/Fri/Sat   Yes [provider]  warfarin (COUMADIN) 5 MG tablet Take 5 mg by mouth 2 (two) times a week. Sunday and Wednesday 07/31/16  Yes [provider]  docusate sodium (COLACE) 100 MG capsule Take 1 capsule (100 mg total) by mouth every 12 (twelve) hours. Patient not taking: Reported on 10/30/2017 07/22/16   Charlesetta Shanks, MD    Allergies:    Allergies  Allergen Reactions  . Dilaudid [Hydromorphone Hcl] Other (See Comments)    Reaction:  Makes pt hyper   . Morphine And Related Other (See Comments)    Pt states that this medication makes him feel like he is out of it.    . Tegretol [Carbamazepine] Hives    Social History:   Social History   Socioeconomic History  . Marital status:  Married    Spouse name: Not on file  . Number of children: Not on file  . Years of education: Not on file  . Highest education level: Not on file  Occupational History  . Not on file  Social Needs  . Financial resource strain: Not on file  . Food insecurity:    Worry: Not on file    Inability: Not on file  . Transportation needs:    Medical: Not on file    Non-medical: Not on file  Tobacco Use  . Smoking status: Former Smoker    Packs/day: 3.00    Years: 48.00    Pack years: 144.00    Types: Cigarettes    Last attempt to quit: 2000    Years since quitting: 19.6  . Smokeless tobacco: Never Used  Substance and Sexual Activity  . Alcohol use: No  . Drug use: No  . Sexual activity: Never  Lifestyle  . Physical activity:    Days per week: Not on file    Minutes per session: Not on file  . Stress: Not on file  Relationships  . Social connections:    Talks on phone: Not on file    Gets together: Not on file    Attends religious service: Not on file    Active member of club or organization: Not on file    Attends meetings of clubs or organizations: Not on file    Relationship status: Not on file  . Intimate partner violence:    Fear of current or ex  partner: Not on file    Emotionally abused: Not on file    Physically abused: Not on file    Forced sexual activity: Not on file  Other Topics Concern  . Not on file  Social History Narrative  . Not on file    Family History:   The patient's family history includes Hypertension in his mother; Other in his father.  ROS:  Please see the history of present illness.  All other ROS reviewed and negative.     Physical Exam/Data:   Vitals:   10/30/17 0422 10/30/17 0744 10/30/17 1000 10/30/17 1224  BP: (!) 123/92  116/86 92/66  Pulse: 100 92 (!) 108 (!) 112  Resp: _0 Temp: (!) 97.5 F (36.4 C)  97.8 F (36.6 C) (!) 97.4 F (36.3 C)  TempSrc: Oral  Oral Oral  SpO2: 98% 95% 92% 94%  Weight: 130.3 kg     Height:        Intake/Output Summary (Last 24 hours) at 10/30/2017 1423 Last data filed at 10/30/2017 1245 Gross per 24 hour  Intake 480 ml  Output 1700 ml  Net -1220 ml   Filed Weights   10/29/17 2100 10/29/17 2202 10/30/17 0422  Weight: 128.8 kg 133.1 kg 130.3 kg   Body mass index is 43.68 kg/m.  General: Well developed morbidly obese WM, in no acute distress. Head: Normocephalic, atraumatic, sclera non-icteric, no xanthomas, nares are without discharge. Neck: Negative for carotid bruits. JVD appears moderately elevated. Lungs: Diffusely diminished in presence of marked obesity without wheezes, rales, or rhonchi. Breathing is unlabored. Heart: Irregularly irregular, tachycardic, with S1 S2. No murmurs, rubs, or gallops appreciated. Abdomen: Soft, rounded/obese with normoactive bowel sounds. No hepatomegaly. No rebound/guarding. No obvious abdominal masses. Msk:  Strength and tone appear normal for age. Extremities: No clubbing or cyanosis. 1-2+ BLE edema, R>L.  Distal pedal pulses are 2+ and equal bilaterally. Neuro: Alert and oriented  X 3. No facial asymmetry. No focal deficit. Moves all extremities spontaneously. Speaks loudly, clearly Psych:  Responds to  questions appropriately with a normal affect.  EKG:  The EKG was personally reviewed and demonstrates atrial fib 124bpm with RVR, RBBB, left axis deviation, one PVC versus abberrantly conduced beat, nonspecific STT changes.  Relevant CV Studies: 2D echo 04/2016 Study Conclusions  - Left ventricle: The cavity size was mildly dilated. Wall   thickness was normal. Systolic function was mildly to moderately   reduced. The estimated ejection fraction was in the range of 40%   to 45%. Abnormal GLPSS at -14%. Diffuse hypokinesis. Doppler   parameters are consistent with abnormal left ventricular   relaxation (grade 1 diastolic dysfunction). The E/e&' ratio is   between 8-15, suggesting indeterminate LV Filling pressure. - Mitral valve: Mildly thickened leaflets . There was trivial   regurgitation. - Left atrium: The atrium was normal in size. - Right atrium: Moderately dilated. - Tricuspid valve: There was moderate regurgitation. - Pulmonary arteries: PA peak pressure: 42 mm Hg (S). - Inferior vena cava: The vessel was dilated. The respirophasic   diameter changes were blunted (< 50%), consistent with elevated   central venous pressure. - Pericardium, extracardiac: There was no pericardial effusion.  Impressions:  - LVEF 40-45%, mildly dilated LV with global hypokinesis, diastolic   dysfunction and elevated LV filling pressure, trivial MR, normal   LA size, moderate RAE, moderate TR, RVSP 42 mmHg, dilated IVC, no   pericardial effusion.   Laboratory Data:  Chemistry Recent Labs  Lab 10/29/17 1840 10/30/17 0552  NA 143 143  K 3.8 3.4*  CL 106 105  CO2 28 27  GLUCOSE 110* 103*  BUN 19 18  CREATININE 0.94 0.97  CALCIUM 8.8* 8.4*  GFRNONAA >60 >60  GFRAA >60 >60  ANIONGAP 9 11    Recent Labs  Lab 10/30/17 0552  PROT 5.9*  ALBUMIN 3.4*  AST 20  ALT 19  ALKPHOS 96  BILITOT 1.0   Hematology Recent Labs  Lab 10/29/17 1840 10/30/17 0552  WBC 8.8 8.6  RBC 4.78  4.46  HGB 14.1 13.1  HCT 45.2 41.4  MCV 94.6 92.8  MCH 29.5 29.4  MCHC 31.2 31.6  RDW 14.6 14.6  PLT 165 149*   Cardiac Enzymes Recent Labs  Lab 10/30/17 0023 10/30/17 0552 10/30/17 1152  TROPONINI <0.03 <0.03 <0.03    Recent Labs  Lab 10/29/17 1856  TROPIPOC 0.00    BNP Recent Labs  Lab 10/29/17 1840  BNP 539.2*    DDimer No results for input(s): DDIMER in the last 168 hours.  Radiology/Studies:  Dg Chest 2 View  Result Date: 10/29/2017 CLINICAL DATA:  RIGHT leg pain and swelling, shortness of breath and atrial fibrillation. EXAM: CHEST - 2 VIEW COMPARISON:  07/22/2016. FINDINGS: Cardiomegaly. No consolidation or edema. Mild scarring LEFT base. No effusion or pneumothorax. Bones unremarkable. IMPRESSION: Cardiomegaly increased from priors.  No active disease. Electronically Signed   By: Staci Righter M.D.   On: 10/29/2017 18:49    Assessment and Plan:   1. Acute on chronic combined CHF/cardiomyopathy - unclear if precipitated by recurrence of atrial fib, or vice versa (i.e. CHF driving recurrent atrial fib). This is also likely influenced by excess sodium in frequent restaurant meals and excess fluid intake. Symptoms have been worsening for 3 weeks. Reviewed 2g sodium restriction, 2L fluid restriction with patient. Will order dietitian consult for reinforcement. He reports good improvement with IV Lasix thus far.  Agree with continuing daily, as long as BP will allow. Was on losartan/HCTZ as OP but not Lasix. Hold losartan due to softer BPs. Await echo, although this appears to have been done while HR was still slightly elevated. Etiology of cardiomyopathy has not been previously determined. Follow strict I/Os and daily weights. Replete K.  2. Recurrent atrial fibrillation with RVR - CHADSVASC is 4 for CHF, HTN, age, vascular disease (aortic altherosclerosis). He is maintained on Coumadin as above. His last INR was approximately 4 weeks ago and therapeutic per his report. It  was therapeutic on admission. He would benefit from restoration from NSR but has not had a weekly INR check the last 3 weeks, therefore cannot proceed directly to DCCV without considering TEE. I will review plans with Dr. Harrington Challenger but tentatively will try a trial digoxin. Ideally would not want to consider long term as data does not support chronic use, but may help short term. Continue Coumadin per pharmacy.  3. Morbid obesity with OSA - may need to revisit sleep study as OP. He's been told he would not need CPAP if sleeping in recliner. Watch for desats overnight.  4. Essential HTN - BP soft this admission, follow. Home amlodipine and HCTZ have been held. Will also hold losartan to allow BP room to work on HR and diuresis.  For questions or updates, please contact [place] Please consult [email or web address] for contact info under Cardiology/STEMI.    Signed, Charlie Pitter, PA-C  10/30/2017 2:23 PM   Patient seen and examined   I agree with findings as noted above by D Dunn Pt is a 72 yo with history of HTN,chronic systolic CHF, PVOD, atrial fibrillatoin   Presents with increased SOB, edema and Afib with RVR On exam, patient is breathing some better after initial diureissi HR 100s to 170 (after walking)  Neck: JVP is increased   Lungs are rel clear   Cardiac exam;   Irreg irrreg   No S3   No murmurs   Abd is obese   Supple   Ext with 2+ edema     Would continue diuresis with IV lasix     Increase b blocker and add digoxin for now for better rate control   Repeat echo pending to eval LVEF and atrial size Pt could benefit for cardioversion, tentatively in a few wks; if rates cannot be controlled consider TEE prior Spoke to pt and wife at length about 1   Salt restriction to 2 to 3 grams (eats out a lot and adds salt)   50fuid restriction (1.5-2 L)   3   Daily wts      Will continue to follow.    PDorris Carnes

## 2017-10-30 NOTE — Progress Notes (Signed)
Brief Nutrition Education Note  RD consulted for diet education on low sodium diet with 2 L fluid restriction.   Per cardiology notes, pt is eats out frequently and consumes a lot of fluids.   Attempted to see pt several times, however, pt was either in with cardiologist or working with physical therapy. Unable to educate pt or provide materials at this time. RD will attempt to see at a later date when pt is available.   Deamonte Sayegh A. Jimmye Norman, RD, LDN, CDE Pager: [phone] After hours Pager: [phone]

## 2017-10-30 NOTE — Progress Notes (Signed)
Pt potassium is 3.4.  Paged MD

## 2017-10-30 NOTE — Progress Notes (Signed)
  Echocardiogram 2D Echocardiogram has been performed.  Ruben Murray G [name] 10/30/2017, 3:07 PM

## 2017-10-31 LAB — BASIC METABOLIC PANEL
Anion gap: 9 (ref 5–15)
BUN: 16 mg/dL (ref 8–23)
CO2: 27 mmol/L (ref 22–32)
CREATININE: 0.94 mg/dL (ref 0.61–1.24)
Calcium: 8.5 mg/dL — ABNORMAL LOW (ref 8.9–10.3)
Chloride: 106 mmol/L (ref 98–111)
GFR calc non Af Amer: >60 mL/min (ref >60)
Glucose, Bld: 101 mg/dL — ABNORMAL HIGH (ref 70–99)
POTASSIUM: 4.2 mmol/L (ref 3.5–5.1)
SODIUM: 142 mmol/L (ref 135–145)

## 2017-10-31 LAB — CBC
HCT: 42.8 % (ref 39.0–52.0)
Hemoglobin: 13.6 g/dL (ref 13.0–17.0)
MCH: 29.6 pg (ref 26.0–34.0)
MCHC: 31.8 g/dL (ref 30.0–36.0)
MCV: 93 fL (ref 78.0–100.0)
PLATELETS: 161 10*3/uL (ref 150–400)
RBC: 4.6 MIL/uL (ref 4.22–5.81)
RDW: 14.5 % (ref 11.5–15.5)
WBC: 8.7 10*3/uL (ref 4.0–10.5)

## 2017-10-31 MED ORDER — BISOPROLOL FUMARATE 5 MG PO TABS: 7.5000 mg | ORAL_TABLET | Freq: Every day | ORAL | Status: DC

## 2017-10-31 MED ORDER — WARFARIN SODIUM 5 MG PO TABS
5.0000 mg | ORAL_TABLET | Freq: Once | ORAL | Status: AC
  Administered 2017-10-31: 5 mg via ORAL
  Filled 2017-10-31: qty 1

## 2017-10-31 MED ORDER — BISOPROLOL FUMARATE 5 MG PO TABS
2.5000 mg | ORAL_TABLET | Freq: Once | ORAL | Status: AC
  Administered 2017-10-31: 2.5 mg via ORAL
  Filled 2017-10-31: qty 1

## 2017-10-31 NOTE — Consult Note (Addendum)
[facility] CM Primary Care Navigator  10/31/2017  Ruben Murray Ruben Murray [phone]   Met with patientat the bedside to identify possible discharge needs. Patient reportshaving "shortness of breath and irregular hear beats" thathad led to this admission.(Atrial fibrillation, acute on chronic combined HF)  Patient endorsesDr.Robert Murray with [facility] as hisprimary care provider but states often seeing the PA (physician assistant) instead.   [facility] service to obtain medicationswithout difficulty.   Patient states that wife Ruben Murray) is managinghis medications at home using "pill box" system filled once a week.  Patient reports thathis wife does most of the driving and will be providing transportation to hisdoctors' appointmentsafter discharge.  [employer] transportation benefits discussed with patient.  Patientlives with wife who serves as his primary caregiver at home.  Anticipated plan for discharge is home with home health services per therapy recommendation.   Patientacknowledges the need to call primary care provider's office after dischargeto schedulea posthospitalfollow-up visitwithin 1- 2 weeksor sooner if needs arise.Patient letter(with PCP's contact number) wasgiven as areminder.  Patientis a 72 year old male with history of Atrial fibrillation on Coumadin, OSA- obstructive sleep apnea, systolic congestive HF with an EF of 40 to 45% and seizure disorder.  He was noticed on admission with bilateral lower extremity swelling, increased abdominal distention and was found to have fluid overload, and be in A- fib with RVR- rapid ventricular response.    Patientmentionedhavinga weighing scaleat home and but admits not checking/ recording his weight daily.He and his wife eat out more than they eat in, at a family  restaurant. He does not limit fluid intake.  Patienthas lack of awareness ofthesigns and symptomsof HF that will need medical assistance. He states not being aware of reportable weight gains,HF zones/ tool and ways to manage heart failure, medicationsand diet restrictions. Noted HF packet provided by RN and Low Sodium Nutrition Therapy handout at the bedside. Patient states that he has been depending on wife to help him manage health issues at home.   Explained to patient regardingTHN care management servicesandresourcesavailable forhimand had voicedinterest about it. He states that him and wife will need to be further educated together since wife is mainly managing his health needs.  Patient hadverbally agreed and [facility] gain further knowledge/ information inmanaginghis health conditions (HF, A-fib, HTN)and reinforce adherence to medications/ treatment interventions after discharge.  Referral made to [facility] forfurther education of patient/ wife on disease management, reinforce on adherence to medical treatments and provide ways tofurther manage HF and other health conditions after discharge.  THNcare managementinformation provided for future needs thatpatient may have.    Addendum:   Update change in transition of care needs.  Will assign patient to [facility] community care coordinator forfurther assessment ofhomeneeds, reinforcement on adherence to medical treatment interventions and provide ways tofurther manage HF and other health conditions after discharge.    For additional questions please contact:  Ruben Murray, BSN, RN-BC [facility] PRIMARY CARE Navigator Cell: [phone]

## 2017-10-31 NOTE — Progress Notes (Signed)
During rounding-  Pt reported feeling fatigued, some tremors in arms, and having to urinate very often.   V.S WNL, no SOB, no chest pain.   Patient had been ambulating often to the bathroom.  Told patient to try using urinal again (previously reported not being able to use it) so he would not get so tired going back and forth.   Also encourage deep breathing/purse lip breathing to slow RR while ambulating.   Paged MD to let know-   MD would like [place] to monitor for now.

## 2017-10-31 NOTE — Progress Notes (Signed)
Progress Note  Patient Name: Ruben Murray Date of Encounter: 10/31/2017  Primary Cardiologist: [name], MD   Subjective   Breathing is better   No CP    Inpatient Medications    Scheduled Meds: . bisoprolol  2.5 mg Oral Once  . [START ON 11/01/2017] bisoprolol  7.5 mg Oral Daily  . [START ON 11/01/2017] digoxin  0.25 mg Intravenous Daily  . furosemide  40 mg Intravenous Daily  . mouth rinse  15 mL Mouth Rinse BID  . pantoprazole  40 mg Oral BID  . PHENobarbital  97.2 mg Oral q morning - 10a  . potassium chloride  40 mEq Oral Daily  . sodium chloride flush  3 mL Intravenous Q12H  . tamsulosin  0.4 mg Oral QPC supper  . warfarin  5 mg Oral ONCE-1800  . Warfarin - Pharmacist Dosing Inpatient   Does not apply q1800   Continuous Infusions: . sodium chloride     PRN Meds:  Vital Signs    Vitals:   10/31/17 1125 10/31/17 1145 10/31/17 1230 10/31/17 1605  BP: (!) 133/96   120/79  Pulse: (!) 106   95  Resp:  (!) _0 Temp:  98.4 F (36.9 C)  99.5 F (37.5 C)  TempSrc:  Oral  Oral  SpO2: 99%   96%  Weight:      Height:        Intake/Output Summary (Last 24 hours) at 10/31/2017 1640 Last data filed at 10/31/2017 1607 Gross per 24 hour  Intake 360 ml  Output 3526 ml  Net -3166 ml   Filed Weights   10/29/17 2202 10/30/17 0422 10/31/17 0455  Weight: 133.1 kg 130.3 kg 130 kg    Telemetry    Afib  90s to 110s - Personally Reviewed  ECG      Physical Exam   GEN: No acute distress.   Neck: JVP is  Increased   Cardiac: Irreg irreg    no murmurs, rubs, or gallops.  Respiratory: Clear to auscultation bilaterally. GI: Soft, nontender, non-distended  MS: 1+edema; No deformity. Neuro:  Nonfocal  Psych: Normal affect   Labs    Chemistry Recent Labs  Lab 10/29/17 1840 10/30/17 0552 10/31/17 0514  NA 143 143 142  K 3.8 3.4* 4.2  CL 106 105 106  CO2 _1 GLUCOSE 110* 103* 101*  BUN _2 CREATININE 0.94 0.97 0.94  CALCIUM 8.8* 8.4*  8.5*  PROT  --  5.9*  --   ALBUMIN  --  3.4*  --   AST  --  20  --   ALT  --  19  --   ALKPHOS  --  96  --   BILITOT  --  1.0  --   GFRNONAA >60 >60 >60  GFRAA >60 >60 >60  ANIONGAP _3 Hematology Recent Labs  Lab 10/29/17 1840 10/30/17 0552 10/31/17 0514  WBC 8.8 8.6 8.7  RBC 4.78 4.46 4.60  HGB 14.1 13.1 13.6  HCT 45.2 41.4 42.8  MCV 94.6 92.8 93.0  MCH 29.5 29.4 29.6  MCHC 31.2 31.6 31.8  RDW 14.6 14.6 14.5  PLT 165 149* 161    Cardiac Enzymes Recent Labs  Lab 10/30/17 0023 10/30/17 0552 10/30/17 1152  TROPONINI <0.03 <0.03 <0.03    Recent Labs  Lab 10/29/17 1856  TROPIPOC 0.00     BNP Recent Labs  Lab 10/29/17 1840  BNP  539.2*     DDimer No results for input(s): DDIMER in the last 168 hours.   Radiology    Dg Chest 2 View  Result Date: 10/29/2017 CLINICAL DATA:  RIGHT leg pain and swelling, shortness of breath and atrial fibrillation. EXAM: CHEST - 2 VIEW COMPARISON:  07/22/2016. FINDINGS: Cardiomegaly. No consolidation or edema. Mild scarring LEFT base. No effusion or pneumothorax. Bones unremarkable. IMPRESSION: Cardiomegaly increased from priors.  No active disease. Electronically Signed   By: Staci Righter M.D.   On: 10/29/2017 18:49    Cardiac Studies   Echo   8/27 Study Conclusions  - Left ventricle: The cavity size was mildly dilated. Wall   thickness was normal. The estimated ejection fraction was 25%.   Diffuse hypokinesis. The study was not technically sufficient to   allow evaluation of LV diastolic dysfunction due to atrial   fibrillation. - Aortic valve: There was no stenosis. There was trivial   regurgitation. - Aorta: Mildly dilated aortic root. Aortic root dimension: 37 mm   (ED). - Mitral valve: There was trivial regurgitation. - Left atrium: The atrium was moderately dilated. - Right ventricle: The cavity size was normal. Systolic function   was moderately reduced. - Right atrium: The atrium was mildly  dilated. - Tricuspid valve: Peak RV-RA gradient (S): 31 mm Hg. - Pulmonary arteries: PA peak pressure: 34 mm Hg (S). - Inferior vena cava: The vessel was normal in size. The   respirophasic diameter changes were in the normal range (>= 50%),   consistent with normal central venous pressure. - Pericardium, extracardiac: A trivial pericardial effusion was   identified.  Impressions:  - The patient was in atrial fibrillation. Mildly dilated LV with EF   25%, diffuse hypokinesis. Normal RV size with moderately   decreased systolic function. No significant valvular   abnormalities.    Patient Profile     72 y.o. male with histroy of systolic/diastolic CHF, PAF, HTN, HL , seizures, PAD, morbid obesity whow as admitted with SOB and rapid atrial fib    Assessment & Plan    1  Acute on chronic systolic CHF   Echo yesterday shows LVEF is worse than previous    (40 to 45% in Feb 2018)   May be tachycardic mediated worsening      Pt is clinically improving though still with some increase on exam  I would recomm continuing IV lasix   ARB is on hold to allow for better HR control  COnsider low dose ARB or Entresto when optimized if allows  2   Atrial fibrillatoin   Rates are improved from yesterday   Continue meds  Continue coumadin  3  HTN  Follow BP      4  HL    Check lipids in AM    For questions or updates, please contact [facility] Please consult [email or web address] for contact info under Cardiology/STEMI.      Signed, Dorris Carnes, MD  10/31/2017, 4:40 PM

## 2017-10-31 NOTE — Progress Notes (Signed)
[place] for Warfarin Indication: atrial fibrillation  Allergies  Allergen Reactions  . Dilaudid [Hydromorphone Hcl] Other (See Comments)    Reaction:  Makes pt hyper   . Morphine And Related Other (See Comments)    Pt states that this medication makes him feel like he is out of it.    . Tegretol [Carbamazepine] Hives    Labs: Recent Labs    10/29/17 1840 10/30/17 0023 10/30/17 0552 10/30/17 1152 10/31/17 0514  HGB 14.1  --  13.1  --  13.6  HCT 45.2  --  41.4  --  42.8  PLT 165  --  149*  --  161  LABPROT 23.4*  --  25.0*  --   --   INR 2.10  --  2.29  --   --   CREATININE 0.94  --  0.97  --  0.94  TROPONINI  --  <0.03 <0.03 <0.03  --     Estimated Creatinine Clearance: 90.7 mL/min (by C-G formula based on SCr of 0.94 mg/dL).   Assessment: 50 YOM with a h/o of Afib on warfarin presents with shortness of breath and chest pain. Pharmacy consulted to resume home warfarin therapy.   INR therapeutic / CBC stable  Dose prior to admission = 5 mg Sunday and Wednesday, 4 mg other days  Goal of Therapy:  INR 2-3 Monitor platelets by anticoagulation protocol: Yes   Plan:  Warfarin 5 mg po x 1 dose tonight at 1800 pm Daily INR  Thank you Anette Guarneri, PharmD [phone]

## 2017-10-31 NOTE — Progress Notes (Signed)
  Vital Signs MEWS/VS Documentation      10/31/2017 1125 10/31/2017 1145 10/31/2017 1230 10/31/2017 1605   MEWS Score:  _0 0   MEWS Score Color:  Green  Yellow  Green  Green   Resp:  -  (!) _1 Pulse:  (!) 106  -  -  95   BP:  (!) 133/96  -  -  120/79   Temp:  -  98.4 F (36.9 C)  -  99.5 F (37.5 C)   O2 Device:  Room Air  -  -  Room Air    A-fib patient- on medication to get rate under control.  Patient is also CHF exas patient that talks until he is SOB.  Recheck RR- pt was fine.         Ruben Murray L Ruben Murray 10/31/2017,4:13 PM

## 2017-10-31 NOTE — Plan of Care (Signed)
  Problem: Clinical Measurements: Goal: Ability to maintain clinical measurements within normal limits will improve Outcome: Progressing   Problem: Clinical Measurements: Goal: Diagnostic test results will improve Outcome: Progressing   Problem: Clinical Measurements: Goal: Cardiovascular complication will be avoided Outcome: Progressing   

## 2017-10-31 NOTE — Progress Notes (Signed)
Patient has a MEWS score of yellow.  Patient is in A-fib (HR 106) and RR of 26.  Recheck patient- pt is A&O, asymptomatic.  Provided education regarding slow, deep breaths and purse lip breathing.

## 2017-10-31 NOTE — Progress Notes (Signed)
Brief Education Follow-Up Note  RD consulted for diet education on low sodium diet with 2 L fluid restriction.   Please see note from 10/30/17; RD attempted to see pt several times yesterday, but was unavailable.   Case discussed with RN, who reports that education has been initiated by RN and MD (RN has reviewed "Living Well with Heart Failure" booklet with pt).   Pt was not in room at time of visit. RD provided "Low Sodium Nutrition Therapy" handout from [facility] Nutrition Care Manual with RD contact information on tray table next to "Living Well with Heart Failure" booklet.   RD will attempt to re-visit as time allows for additional reinforcement.   [name] A. Jimmye Norman, RD, LDN, CDE Pager: [phone] After hours Pager: [phone]

## 2017-10-31 NOTE — Progress Notes (Signed)
PROGRESS NOTE    Ruben Murray  ZDG:[id number] DOB: 1945-06-04 DOA: 10/29/2017 PCP: [name], MD   Brief Narrative:  72 year old male with history of Isebella [place]. fib on Coumadin, OSA, systolic CHF with an EF of 40 to 45%, seizure disorder who presented to the hospital with shortness of breath and intermittent chest pain for the past couple weeks.  He also has noticed bilateral lower extremity swelling as well as increased abdominal distention.  On admission he was found to have fluid overload, and be in Luisalberto Beegle. fib with RVR  Assessment & Plan:   Active Problems:   Essential hypertension   Hyperlipidemia   Seizures (HCC)   Atrial fibrillation with RVR (HCC)   Acute on chronic combined systolic and diastolic CHF (congestive heart failure) (HCC)   CHF exacerbation (HCC)   Prolonged QT interval   Keylin Podolsky fib with RVR -Increase bisoprolol to 7.5 -continue digoxin per cards -Cardiology consulted, appreciate input - may plan for cardioversion in Jaris Kohles few weeks  Acute on chronic combined CHF -Possibly in the setting of poorly controlled [place]i [place]. Fib? -Continue lasix 40 mg daily -Echo with EF 25%, diffuse hypokinesis (see report below) -I/O, daily weights  -He has gained about 9 pounds at home weighing 293lbs on admission -> ~286 today -Given asymmetric lower extremity swelling Dopplers were done on admission, negative for DVT.   -Cardiology c/s, appreciate recs  Wt Readings from Last 3 Encounters:  10/31/17 130 kg  10/05/16 132.1 kg  08/31/16 131.2 kg   Arthritis -Status post total knee replacement on the left by Dr. Gladstone Lighter, as well as right ankle osteoarthritis followed by Dr. Doran Durand, not Bengie Kaucher candidate for surgery.  Stable.  Hypertension -Continue bisoprolol, furosemide.  Currently holding losartan.  HCTZ d/c'd.  Amlodipine being held.  BP appropriate.  Seizure disorder -Continue phenobarbital.  Stable.  OSA: needs outpatient sleep study  DVT prophylaxis: warfarin Code Status: DNR Family  Communication: none at bedside Disposition Plan: pending   Consultants:   cardiology  Procedures:  Study Conclusions  - Left ventricle: The cavity size was mildly dilated. Wall   thickness was normal. The estimated ejection fraction was 25%.   Diffuse hypokinesis. The study was not technically sufficient to   allow evaluation of LV diastolic dysfunction due to atrial   fibrillation. - Aortic valve: There was no stenosis. There was trivial   regurgitation. - Aorta: Mildly dilated aortic root. Aortic root dimension: 37 mm   (ED). - Mitral valve: There was trivial regurgitation. - Left atrium: The atrium was moderately dilated. - Right ventricle: The cavity size was normal. Systolic function   was moderately reduced. - Right atrium: The atrium was mildly dilated. - Tricuspid valve: Peak RV-RA gradient (S): 31 mm Hg. - Pulmonary arteries: PA peak pressure: 34 mm Hg (S). - Inferior vena cava: The vessel was normal in size. The   respirophasic diameter changes were in the normal range (>= 50%),   consistent with normal central venous pressure. - Pericardium, extracardiac: Tylar Amborn trivial pericardial effusion was   identified.  Impressions:  - The patient was in atrial fibrillation. Mildly dilated LV with EF   25%, diffuse hypokinesis. Normal RV size with moderately   decreased systolic function. No significant valvular   abnormalities.  Final Interpretation: Right: There is no evidence of deep vein thrombosis in the lower extremity. No cystic structure found in the popliteal fossa. Left: No evidence of common femoral vein obstruction.  Antimicrobials:  Anti-infectives (From admission, onward)   None  Subjective: Denies complaints today.  Objective: Vitals:   10/31/17 0055 10/31/17 0455 10/31/17 1125 10/31/17 1145  BP: 110/73 120/78 (!) 133/96   Pulse: (!) 112 98 (!) 106   Resp: 18 18  (!) 26  Temp: 98 F (36.7 C) 98.1 F (36.7 C)  98.4 F (36.9 C)    TempSrc: Oral Oral  Oral  SpO2: 93% 92% 99%   Weight:  130 kg    Height:        Intake/Output Summary (Last 24 hours) at 10/31/2017 1329 Last data filed at 10/31/2017 1315 Gross per 24 hour  Intake 360 ml  Output 3501 ml  Net -3141 ml   Filed Weights   10/29/17 2202 10/30/17 0422 10/31/17 0455  Weight: 133.1 kg 130.3 kg 130 kg    Examination:  General exam: Appears calm and comfortable  Respiratory system: Clear to auscultation. Respiratory effort normal. Cardiovascular system: S1 & S2 heard, tachycardic.  Gastrointestinal system: Abdomen is nondistended, soft and nontender. Central nervous system: Alert and oriented. No focal neurological deficits. Extremities: bilateral LEE, 1+ Skin: No rashes, lesions or ulcers Psychiatry: Judgement and insight appear normal. Mood & affect appropriate.     Data Reviewed: I have personally reviewed following labs and imaging studies  CBC: Recent Labs  Lab 10/29/17 1840 10/30/17 0552 10/31/17 0514  WBC 8.8 8.6 8.7  NEUTROABS  --  5.1  --   HGB 14.1 13.1 13.6  HCT 45.2 41.4 42.8  MCV 94.6 92.8 93.0  PLT 165 149* 161   Basic Metabolic Panel: Recent Labs  Lab 10/29/17 1840 10/30/17 0552 10/31/17 0514  NA 143 143 142  K 3.8 3.4* 4.2  CL 106 105 106  CO2 _0 GLUCOSE 110* 103* 101*  BUN _1 CREATININE 0.94 0.97 0.94  CALCIUM 8.8* 8.4* 8.5*  MG 1.9 1.8  --   PHOS  --  3.7  --    GFR: Estimated Creatinine Clearance: 90.7 mL/min (by C-G formula based on SCr of 0.94 mg/dL). Liver Function Tests: Recent Labs  Lab 10/30/17 0552  AST 20  ALT 19  ALKPHOS 96  BILITOT 1.0  PROT 5.9*  ALBUMIN 3.4*   No results for input(s): LIPASE, AMYLASE in the last 168 hours. No results for input(s): AMMONIA in the last 168 hours. Coagulation Profile: Recent Labs  Lab 10/29/17 1840 10/30/17 0552  INR 2.10 2.29   Cardiac Enzymes: Recent Labs  Lab 10/30/17 0023 10/30/17 0552 10/30/17 1152  TROPONINI <0.03 <0.03  <0.03   BNP (last 3 results) No results for input(s): PROBNP in the last 8760 hours. HbA1C: No results for input(s): HGBA1C in the last 72 hours. CBG: No results for input(s): GLUCAP in the last 168 hours. Lipid Profile: No results for input(s): CHOL, HDL, LDLCALC, TRIG, CHOLHDL, LDLDIRECT in the last 72 hours. Thyroid Function Tests: Recent Labs    10/30/17 0023  TSH 1.469   Anemia Panel: No results for input(s): VITAMINB12, FOLATE, FERRITIN, TIBC, IRON, RETICCTPCT in the last 72 hours. Sepsis Labs: No results for input(s): PROCALCITON, LATICACIDVEN in the last 168 hours.  No results found for this or any previous visit (from the past 240 hour(s)).       Radiology Studies: Dg Chest 2 View  Result Date: 10/29/2017 CLINICAL DATA:  RIGHT leg pain and swelling, shortness of breath and atrial fibrillation. EXAM: CHEST - 2 VIEW COMPARISON:  07/22/2016. FINDINGS: Cardiomegaly. No consolidation or edema. Mild scarring LEFT base. No effusion or pneumothorax.  Bones unremarkable. IMPRESSION: Cardiomegaly increased from priors.  No active disease. Electronically Signed   By: Staci Righter M.D.   On: 10/29/2017 18:49        Scheduled Meds: . bisoprolol  5 mg Oral Daily  . [START ON 11/01/2017] digoxin  0.25 mg Intravenous Daily  . furosemide  40 mg Intravenous Daily  . mouth rinse  15 mL Mouth Rinse BID  . pantoprazole  40 mg Oral BID  . PHENobarbital  97.2 mg Oral q morning - 10a  . potassium chloride  40 mEq Oral Daily  . sodium chloride flush  3 mL Intravenous Q12H  . tamsulosin  0.4 mg Oral QPC supper  . warfarin  5 mg Oral ONCE-1800  . Warfarin - Pharmacist Dosing Inpatient   Does not apply q1800   Continuous Infusions: . sodium chloride       LOS: 2 days    Time spent: over 30 min MDM moderate with multiple medical issues    Fayrene Helper, MD [facility] Pager [phone]  If 7PM-7AM, please contact night-coverage [email or web address] Password  [facility] 10/31/2017, 1:29 PM

## 2017-11-01 ENCOUNTER — Encounter: Payer: Self-pay | Admitting: *Deleted

## 2017-11-01 LAB — URINALYSIS, ROUTINE W REFLEX MICROSCOPIC
Bilirubin Urine: NEGATIVE
Glucose, UA: NEGATIVE mg/dL
Hgb urine dipstick: NEGATIVE
Ketones, ur: NEGATIVE mg/dL
Leukocytes, UA: NEGATIVE
Nitrite: NEGATIVE
Protein, ur: NEGATIVE mg/dL
Specific Gravity, Urine: 1.005 (ref 1.005–1.030)
pH: 7 (ref 5.0–8.0)

## 2017-11-01 LAB — BASIC METABOLIC PANEL
ANION GAP: 12 (ref 5–15)
BUN: 12 mg/dL (ref 8–23)
CALCIUM: 8.5 mg/dL — AB (ref 8.9–10.3)
CO2: 26 mmol/L (ref 22–32)
Chloride: 101 mmol/L (ref 98–111)
Creatinine, Ser: 1 mg/dL (ref 0.61–1.24)
GFR calc non Af Amer: >60 mL/min (ref >60)
Glucose, Bld: 103 mg/dL — ABNORMAL HIGH (ref 70–99)
POTASSIUM: 3.9 mmol/L (ref 3.5–5.1)
SODIUM: 139 mmol/L (ref 135–145)

## 2017-11-01 LAB — MAGNESIUM: MAGNESIUM: 1.8 mg/dL (ref 1.7–2.4)

## 2017-11-01 LAB — PROTIME-INR
INR: 2.33
PROTHROMBIN TIME: 25.4 s — AB (ref 11.4–15.2)

## 2017-11-01 MED ORDER — DIGOXIN 125 MCG PO TABS
0.2500 mg | ORAL_TABLET | Freq: Every day | ORAL | Status: DC
  Administered 2017-11-02 – 2017-11-04 (×3): 0.25 mg via ORAL
  Filled 2017-11-01 (×3): qty 2

## 2017-11-01 MED ORDER — BISOPROLOL FUMARATE 5 MG PO TABS
10.0000 mg | ORAL_TABLET | Freq: Every day | ORAL | Status: DC
  Administered 2017-11-01 – 2017-11-02 (×2): 10 mg via ORAL
  Filled 2017-11-01 (×2): qty 2

## 2017-11-01 MED ORDER — FUROSEMIDE 10 MG/ML IJ SOLN
80.0000 mg | Freq: Two times a day (BID) | INTRAMUSCULAR | Status: DC
  Administered 2017-11-01 – 2017-11-03 (×5): 80 mg via INTRAVENOUS
  Filled 2017-11-01 (×5): qty 8

## 2017-11-01 MED ORDER — SACUBITRIL-VALSARTAN 24-26 MG PO TABS
1.0000 | ORAL_TABLET | Freq: Two times a day (BID) | ORAL | Status: DC
  Administered 2017-11-02 – 2017-11-04 (×4): 1 via ORAL
  Filled 2017-11-01 (×5): qty 1

## 2017-11-01 MED ORDER — WARFARIN SODIUM 2 MG PO TABS
4.0000 mg | ORAL_TABLET | Freq: Once | ORAL | Status: AC
  Administered 2017-11-01: 4 mg via ORAL
  Filled 2017-11-01: qty 2

## 2017-11-01 MED ORDER — POTASSIUM CHLORIDE CRYS ER 20 MEQ PO TBCR
20.0000 meq | EXTENDED_RELEASE_TABLET | Freq: Two times a day (BID) | ORAL | Status: DC
  Administered 2017-11-01 – 2017-11-04 (×7): 20 meq via ORAL
  Filled 2017-11-01 (×8): qty 1

## 2017-11-01 MED ORDER — FUROSEMIDE 10 MG/ML IJ SOLN: 80.0000 mg | Freq: Two times a day (BID) | INTRAMUSCULAR | Status: DC

## 2017-11-01 NOTE — Discharge Instructions (Signed)

## 2017-11-01 NOTE — Progress Notes (Signed)
  This encounter was created in error - please disregard.

## 2017-11-01 NOTE — Progress Notes (Signed)
ANTICOAGULATION CONSULT NOTE - Follow Up Consult  Pharmacy Consult for Coumadin Indication: atrial fibrillation  Patient Measurements: Height: _0  (172.7 cm) Weight: 282 lb 6.4 oz (128.1 kg)(Scale B) IBW/kg (Calculated) : 68.4  Vital Signs: Temp: 98.4 F (36.9 C) (08/29 1135) Temp Source: Oral (08/29 1135) BP: 112/93 (08/29 1135) Pulse Rate: 86 (08/29 1135)  Labs: Recent Labs    10/29/17 1840 10/30/17 0023 10/30/17 0552 10/30/17 1152 10/31/17 0514 11/01/17 0428  HGB 14.1  --  13.1  --  13.6  --   HCT 45.2  --  41.4  --  42.8  --   PLT 165  --  149*  --  161  --   LABPROT 23.4*  --  25.0*  --   --  25.4*  INR 2.10  --  2.29  --   --  2.33  CREATININE 0.94  --  0.97  --  0.94 1.00  TROPONINI  --  <0.03 <0.03 <0.03  --   --     Estimated Creatinine Clearance: 84.6 mL/min (by C-G formula based on SCr of 1 mg/dL).   Assessment:  72 yr old male continues on Coumadin as prior to admission for atrial fibrillation.   INR therapeutic on admit (2.19) and remains therapeutic at 2.33.    PTA Coumadin regimen:  4 mg daily except 5 mg on Wednesdays and Sundays.  Goal of Therapy:  INR 2-3 Monitor platelets by anticoagulation protocol: Yes   Plan:   Coumadin 4 mg today, usual Thursday dose.  Daily PT/INR for now, but if stable in am, will decrease frequency.  Arty Baumgartner, [place] Pager: [phone] or phone: [phone] 11/01/2017,12:01 PM

## 2017-11-01 NOTE — Progress Notes (Addendum)
Physical Therapy Treatment Patient Details Name: Ruben Murray MRN: [id number] DOB: 01/18/1946 Today's Date: 11/01/2017    History of Present Illness 72 yo with SoB and edema with CHF exacerbation. PMHx: seizure d/o, atrial fibrillation on coumadin , sleep apnea, Systolic CHF, HTN, Lt TKA, pt reports Rt weak ankle awaiting a brace    PT Comments    Pt pleasant sitting in chair on arrival. Pt able to ambulate 256f w/ HR ranged  primarily in 120s-130s throughout. HR spiked 3 times for 5-10 seconds up to 150s, but w/ pt taking standing rest break HR quickly recovered back to 130s, with nurse aware. Will continue to see acutely to increase activity tolerance.     Follow Up Recommendations  Home health PT     Equipment Recommendations  None recommended by PT    Recommendations for Other Services       Precautions / Restrictions Precautions Precautions: Fall Restrictions Weight Bearing Restrictions: No    Mobility  Bed Mobility                  Transfers Overall transfer level: Needs assistance Equipment used: Straight cane Transfers: Sit to/from Stand Sit to Stand: Min guard         General transfer comment: Min Guard A for safety, sit to stand 3 trials, third trial from toilet able to progress to supervision from outside door.   Ambulation/Gait Ambulation/Gait assistance: Min guard Gait Distance (Feet): 200 Feet Assistive device: Rolling walker (2 wheeled) Gait Pattern/deviations: Step-through pattern;Wide base of support   Gait velocity interpretation: >2.62 ft/sec, indicative of community ambulatory General Gait Details: pt use of single point cane today overall steady. Pt w/ SOB after 1030f SpO2 on RA at 95%. Pt HR primary 130s w/ a few spikes up to 150s that quickly resolved.         Balance Overall balance assessment: Modified Independent                                          Cognition Arousal/Alertness:  Awake/alert Behavior During Therapy: WFL for tasks assessed/performed Overall Cognitive Status: Within Functional Limits for tasks assessed                                           General Comments General comments (skin integrity, edema, etc.): use of cane to maintain standing balance and for walking       Pertinent Vitals/Pain Pain Assessment: No/denies pain     PT Goals (current goals can now be found in the care plan section) Progress towards PT goals: Progressing toward goals    Frequency    Min 3X/week      PT Plan Current plan remains appropriate       AM-PAC PT "6 Clicks" Daily Activity  Outcome Measure  Difficulty turning over in bed (including adjusting bedclothes, sheets and blankets)?: A Little Difficulty moving from lying on back to sitting on the side of the bed? : A Little Difficulty sitting down on and standing up from a chair with arms (e.g., wheelchair, bedside commode, etc,.)?: A Little Help needed moving to and from a bed to chair (including a wheelchair)?: A Little Help needed walking in hospital room?: A Little Help needed climbing 3-5 steps with a railing? :  A Lot 6 Click Score: 17    End of Session Equipment Utilized During Treatment: Gait belt Activity Tolerance: Patient tolerated treatment well Patient left: with call bell/phone within reach;with nursing/sitter in room;in chair;with chair alarm set Nurse Communication: Mobility status PT Visit Diagnosis: Other abnormalities of gait and mobility (R26.89);Muscle weakness (generalized) (M62.81)     Time: 1007-1219 PT Time Calculation (min) (ACUTE ONLY): 20 min  Charges:  $Gait Training: 8-22 mins                     Samuella Bruin, [place]  Acute Rehab [phone]    Samuella Bruin 11/01/2017, 1:52 PM

## 2017-11-01 NOTE — Progress Notes (Signed)
PROGRESS NOTE    Ruben Murray  YTK:[id number] DOB: 1946/02/06 DOA: 10/29/2017 PCP: [name], MD   Brief Narrative:  72 year old male with history of Keilynn Marano. fib on Coumadin, OSA, systolic CHF with an EF of 40 to 45%, seizure disorder who presented to the hospital with shortness of breath and intermittent chest pain for the past couple weeks.  He also has noticed bilateral lower extremity swelling as well as increased abdominal distention.  On admission he was found to have fluid overload, and be in Jakaylah Schlafer. fib with RVR  Assessment & Plan:   Active Problems:   Essential hypertension   Hyperlipidemia   Seizures (HCC)   Atrial fibrillation with RVR (HCC)   Acute on chronic combined systolic and diastolic CHF (congestive heart failure) (HCC)   CHF exacerbation (HCC)   Prolonged QT interval   Jannel Lynne fib with RVR -HR in 100's -110's on monitor, but rates continue to intermittently get into the 140's on telemetry -will continue to increase bisoprolol, 10 mg today -continue digoxin per cards -Cardiology consulted, appreciate input - may plan for cardioversion in Tyneisha Hegeman few weeks -chadvasc at least 3. continue warfarin, inr therapeutic  Acute on chronic combined CHF -Possibly in the setting of poorly controlled Chanice Brenton. Fib? -Continue lasix 40 mg daily IV -Echo with EF 25%, diffuse hypokinesis (see report below) -I/O, daily weights  -He has gained about 9 pounds at home weighing 293lbs on admission -> ~282 today -Given asymmetric lower extremity swelling Dopplers were done on admission, negative for DVT.   -Cardiology c/s, appreciate recs  -Will plan for low dose entresto tomorrow as long as BP continues to allow  Wt Readings from Last 3 Encounters:  11/01/17 128.1 kg  10/05/16 132.1 kg  08/31/16 131.2 kg   Arthritis -Status post total knee replacement on the left by Dr. Gladstone Lighter, as well as right ankle osteoarthritis followed by Dr. Doran Durand, not Sanvika Cuttino candidate for surgery.   Stable.  Hypertension -Continue bisoprolol, furosemide.  Currently holding losartan.  HCTZ d/c'd.  Amlodipine being held.  BP stable.  Seizure disorder -Continue phenobarbital.  stable.  OSA: needs outpatient sleep study  DVT prophylaxis: warfarin Code Status: DNR Family Communication: none at bedside Disposition Plan: pending   Consultants:   cardiology  Procedures:  Study Conclusions  - Left ventricle: The cavity size was mildly dilated. Wall   thickness was normal. The estimated ejection fraction was 25%.   Diffuse hypokinesis. The study was not technically sufficient to   allow evaluation of LV diastolic dysfunction due to atrial   fibrillation. - Aortic valve: There was no stenosis. There was trivial   regurgitation. - Aorta: Mildly dilated aortic root. Aortic root dimension: 37 mm   (ED). - Mitral valve: There was trivial regurgitation. - Left atrium: The atrium was moderately dilated. - Right ventricle: The cavity size was normal. Systolic function   was moderately reduced. - Right atrium: The atrium was mildly dilated. - Tricuspid valve: Peak RV-RA gradient (S): 31 mm Hg. - Pulmonary arteries: PA peak pressure: 34 mm Hg (S). - Inferior vena cava: The vessel was normal in size. The   respirophasic diameter changes were in the normal range (>= 50%),   consistent with normal central venous pressure. - Pericardium, extracardiac: Dione Petron trivial pericardial effusion was   identified.  Impressions:  - The patient was in atrial fibrillation. Mildly dilated LV with EF   25%, diffuse hypokinesis. Normal RV size with moderately   decreased systolic function. No significant valvular  abnormalities.  Final Interpretation: Right: There is no evidence of deep vein thrombosis in the lower extremity. No cystic structure found in the popliteal fossa. Left: No evidence of common femoral vein obstruction.  Antimicrobials:  Anti-infectives (From admission, onward)   None         Subjective: Feeling better. Got up to bathroom this morning, denies SOB or CP.  Objective: Vitals:   10/31/17 1753 10/31/17 2053 11/01/17 0046 11/01/17 0459  BP: 110/87 (!) 118/93 115/78 131/88  Pulse: (!) 114 (!) 111 96 91  Resp: _0 (!) 21  Temp: 98.2 F (36.8 C) 98.6 F (37 C) 98.4 F (36.9 C) 97.6 F (36.4 C)  TempSrc: Oral Oral Oral Oral  SpO2: 96% 94% 95% 96%  Weight:    128.1 kg  Height:        Intake/Output Summary (Last 24 hours) at 11/01/2017 1017 Last data filed at 11/01/2017 0842 Gross per 24 hour  Intake 1182 ml  Output 2751 ml  Net -1569 ml   Filed Weights   10/30/17 0422 10/31/17 0455 11/01/17 0459  Weight: 130.3 kg 130 kg 128.1 kg    Examination:  General: No acute distress. Cardiovascular: Heart sounds show Kealii Thueson regular rate, and rhythm. Lungs: Clear to auscultation bilaterally with good air movement.  Abdomen: Soft, nontender, nondistended  Neurological: Alert and oriented 3. Moves all extremities 4. Cranial nerves II through XII grossly intact. Skin: Warm and dry. No rashes or lesions. Extremities: No clubbing or cyanosis. 2+ LEE, R>L Psychiatric: Mood and affect are normal. Insight and judgment are appropriate.    Data Reviewed: I have personally reviewed following labs and imaging studies  CBC: Recent Labs  Lab 10/29/17 1840 10/30/17 0552 10/31/17 0514  WBC 8.8 8.6 8.7  NEUTROABS  --  5.1  --   HGB 14.1 13.1 13.6  HCT 45.2 41.4 42.8  MCV 94.6 92.8 93.0  PLT 165 149* 732   Basic Metabolic Panel: Recent Labs  Lab 10/29/17 1840 10/30/17 0552 10/31/17 0514 11/01/17 0428  NA 143 143 142 139  K 3.8 3.4* 4.2 3.9  CL 106 105 106 101  CO2 _1 GLUCOSE 110* 103* 101* 103*  BUN _2 CREATININE 0.94 0.97 0.94 1.00  CALCIUM 8.8* 8.4* 8.5* 8.5*  MG 1.9 1.8  --  1.8  PHOS  --  3.7  --   --    GFR: Estimated Creatinine Clearance: 84.6 mL/min (by C-G formula based on SCr of 1 mg/dL). Liver Function  Tests: Recent Labs  Lab 10/30/17 0552  AST 20  ALT 19  ALKPHOS 96  BILITOT 1.0  PROT 5.9*  ALBUMIN 3.4*   No results for input(s): LIPASE, AMYLASE in the last 168 hours. No results for input(s): AMMONIA in the last 168 hours. Coagulation Profile: Recent Labs  Lab 10/29/17 1840 10/30/17 0552 11/01/17 0428  INR 2.10 2.29 2.33   Cardiac Enzymes: Recent Labs  Lab 10/30/17 0023 10/30/17 0552 10/30/17 1152  TROPONINI <0.03 <0.03 <0.03   BNP (last 3 results) No results for input(s): PROBNP in the last 8760 hours. HbA1C: No results for input(s): HGBA1C in the last 72 hours. CBG: No results for input(s): GLUCAP in the last 168 hours. Lipid Profile: No results for input(s): CHOL, HDL, LDLCALC, TRIG, CHOLHDL, LDLDIRECT in the last 72 hours. Thyroid Function Tests: Recent Labs    10/30/17 0023  TSH 1.469   Anemia Panel: No results for input(s): VITAMINB12, FOLATE, FERRITIN, TIBC,  IRON, RETICCTPCT in the last 72 hours. Sepsis Labs: No results for input(s): PROCALCITON, LATICACIDVEN in the last 168 hours.  No results found for this or any previous visit (from the past 240 hour(s)).       Radiology Studies: No results found.      Scheduled Meds: . bisoprolol  10 mg Oral Daily  . digoxin  0.25 mg Intravenous Daily  . furosemide  40 mg Intravenous Daily  . mouth rinse  15 mL Mouth Rinse BID  . pantoprazole  40 mg Oral BID  . PHENobarbital  97.2 mg Oral q morning - 10a  . potassium chloride  40 mEq Oral Daily  . sodium chloride flush  3 mL Intravenous Q12H  . tamsulosin  0.4 mg Oral QPC supper  . Warfarin - Pharmacist Dosing Inpatient   Does not apply q1800   Continuous Infusions: . sodium chloride       LOS: 3 days    Time spent: over 30 min MDM moderate with multiple medical issues    Fayrene Helper, MD [facility] Pager [phone]  If 7PM-7AM, please contact night-coverage [email or web address] Password [facility] 11/01/2017, 10:17 AM

## 2017-11-01 NOTE — Progress Notes (Signed)
Benefit check in progress for Entresto. Mindi Slicker [facility] [phone]

## 2017-11-01 NOTE — Progress Notes (Signed)
Occupational Therapy Treatment Patient Details Name: Ruben Murray MRN: [id number] DOB: 05-22-1945 Today's Date: 11/01/2017    History of present illness 72 yo with SoB and edema with CHF exacerbation. PMHx: seizure d/o, atrial fibrillation on coumadin , sleep apnea, Systolic CHF, HTN, Lt TKA, pt reports Rt weak ankle awaiting a brace   OT comments  Pt making progress toward OT goals with improved functional mobility, however still becoming quite SOB and with elevated HR during and after ADL transfers. Continue OT services per POC and d/c plan remains appropriate.    Follow Up Recommendations  Home health OT;Supervision/Assistance - 24 hour    Equipment Recommendations  3 in 1 bedside commode    Recommendations for Other Services PT consult    Precautions / Restrictions Precautions Precautions: Fall Restrictions Weight Bearing Restrictions: No       Mobility Bed Mobility                  Transfers Overall transfer level: Needs assistance Equipment used: Straight cane Transfers: Sit to/from Stand Sit to Stand: Supervision         General transfer comment: Min Guard A for safety, sit to stand 3 trials, third trial from toilet able to progress to supervision from outside door.     Balance Overall balance assessment: Modified Independent                             ADL either performed or assessed with clinical judgement   ADL Overall ADL's : Needs assistance/impaired                         Toilet Transfer: Supervision/safety;Grab bars Toilet Transfer Details (indicate cue type and reason): Pt was found on toilet upon entry, ambulating with no A Toileting- Clothing Manipulation and Hygiene: Supervision/safety;Sit to/from stand       Functional mobility during ADLs: Min guard;Cane General ADL Comments: HR elevated during functional mobility to 130 and remaining at 115 for several minutes following     Vision       Perception      Praxis      Cognition Arousal/Alertness: Awake/alert Behavior During Therapy: WFL for tasks assessed/performed Overall Cognitive Status: Within Functional Limits for tasks assessed                                   General Comments use of cane to maintain standing balance and for walking     Pertinent Vitals/ Pain       Pain Assessment: No/denies pain         Frequency  Min 2X/week        Progress Toward Goals  OT Goals(current goals can now be found in the care plan section)  Progress towards OT goals: Progressing toward goals  Acute Rehab OT Goals Patient Stated Goal: "go back home to my wife" OT Goal Formulation: With patient Time For Goal Achievement: 11/13/17 Potential to Achieve Goals: Good  Plan Discharge plan remains appropriate       AM-PAC PT "6 Clicks" Daily Activity     Outcome Measure   Help from another person eating meals?: None Help from another person taking care of personal grooming?: A Little Help from another person toileting, which includes using toliet, bedpan, or urinal?: A Little Help from another person bathing (including washing, rinsing,  drying)?: A Little Help from another person to put on and taking off regular upper body clothing?: None Help from another person to put on and taking off regular lower body clothing?: A Little 6 Click Score: 20    End of Session Equipment Utilized During Treatment: Gait belt;Other (comment)(cane)  OT Visit Diagnosis: Unsteadiness on feet (R26.81);Other abnormalities of gait and mobility (R26.89);Muscle weakness (generalized) (M62.81)   Activity Tolerance Patient tolerated treatment well   Patient Left in chair;with call bell/phone within reach;with chair alarm set   Nurse Communication Mobility status        Time: 8341-9622 OT Time Calculation (min): 17 min  Charges: OT General Charges $OT Visit: 1 Visit OT Treatments $Therapeutic Activity: 8-22 mins    Curtis Sites  OTR/L 11/01/2017, 2:37 PM

## 2017-11-01 NOTE — Care Management Note (Signed)
Case Management Note  Patient Details  Name: Ruben Murray MRN: [id number] Date of Birth: 1945/09/19  Subjective/Objective:  Essential HTN                 Action/Plan: Patient lives at home with his spouse; he is the caregiver for his mother; PCP: [name], MD; has private insurance with [facility] Medicare with prescription drug coverage; pharmacy [facility] choice [facility], pt reports no problem getting his medication; [place] choice offered, pt chose [facility]; [place] with [facility] called for arrangements; DME - cane. CM will continue to follow for progression of care.  Expected Discharge Date:  Possibly 11/02/2017            Expected Discharge Plan:  [place]  In-House Referral:   [facility]  Discharge planning Services  CM Consult    Choice offered to:  Patient  HH Arranged:  RN, Disease Management, PT [place] Agency:  [facility] (formerly [facility])  Status of Service:  In process, will continue to follow  Sherrilyn Rist [phone] 11/01/2017, 2:50 PM

## 2017-11-01 NOTE — Care Management (Signed)
Spoke to [employer] co-pay amount for : Entresto 24 - 26 mg.per tablet twice a day will be  $45.00 for a 30 day supply. Co-pay  Entresto 24 - 26 mg.per tablet mail in for a 90 day supply will be $125.00.  Sh[place] with [employer] stated that sacubitril-valsarton not a covered drug in their system.  PA is Required@ [phone].  Preferred Pharmacy is [facility].  CASE # for this call [id number] Ph# [phone]  KCLEX-NTZ GYFVC

## 2017-11-01 NOTE — Progress Notes (Addendum)
Progress Note  Patient Name: Ruben Murray Date of Encounter: 11/01/2017  Primary Cardiologist: [name], MD   Subjective   Breathing is OK   NO CP    Inpatient Medications    Scheduled Meds: . bisoprolol  10 mg Oral Daily  . digoxin  0.25 mg Intravenous Daily  . furosemide  40 mg Intravenous Daily  . mouth rinse  15 mL Mouth Rinse BID  . pantoprazole  40 mg Oral BID  . PHENobarbital  97.2 mg Oral q morning - 10a  . potassium chloride  40 mEq Oral Daily  . [START ON 11/02/2017] sacubitril-valsartan  1 tablet Oral BID  . sodium chloride flush  3 mL Intravenous Q12H  . tamsulosin  0.4 mg Oral QPC supper  . Warfarin - Pharmacist Dosing Inpatient   Does not apply q1800   Continuous Infusions: . sodium chloride     PRN Meds:  Vital Signs    Vitals:   10/31/17 1753 10/31/17 2053 11/01/17 0046 11/01/17 0459  BP: 110/87 (!) 118/93 115/78 131/88  Pulse: (!) 114 (!) 111 96 91  Resp: _0 (!) 21  Temp: 98.2 F (36.8 C) 98.6 F (37 C) 98.4 F (36.9 C) 97.6 F (36.4 C)  TempSrc: Oral Oral Oral Oral  SpO2: 96% 94% 95% 96%  Weight:    128.1 kg  Height:        Intake/Output Summary (Last 24 hours) at 11/01/2017 1041 Last data filed at 11/01/2017 0842 Gross per 24 hour  Intake 1182 ml  Output 2550 ml  Net -1368 ml   Filed Weights   10/30/17 0422 10/31/17 0455 11/01/17 0459  Weight: 130.3 kg 130 kg 128.1 kg    Telemetry    Afib  90s to 100s - Personally Reviewed  ECG      Physical Exam   GEN: No acute distress.   Neck: JVP is  Increased   Cardiac: Irreg irreg    no murmurs, rubs, or gallops.  Respiratory: Clear to auscultation bilaterally. GI: Soft, nontender, non-distended  MS: Triv edema; No deformity. Neuro:  Nonfocal  Psych: Normal affect   Labs    Chemistry Recent Labs  Lab 10/30/17 0552 10/31/17 0514 11/01/17 0428  NA 143 142 139  K 3.4* 4.2 3.9  CL 105 106 101  CO2 _1 GLUCOSE 103* 101* 103*  BUN _2 CREATININE  0.97 0.94 1.00  CALCIUM 8.4* 8.5* 8.5*  PROT 5.9*  --   --   ALBUMIN 3.4*  --   --   AST 20  --   --   ALT 19  --   --   ALKPHOS 96  --   --   BILITOT 1.0  --   --   GFRNONAA >60 >60 >60  GFRAA >60 >60 >60  ANIONGAP _3 Hematology Recent Labs  Lab 10/29/17 1840 10/30/17 0552 10/31/17 0514  WBC 8.8 8.6 8.7  RBC 4.78 4.46 4.60  HGB 14.1 13.1 13.6  HCT 45.2 41.4 42.8  MCV 94.6 92.8 93.0  MCH 29.5 29.4 29.6  MCHC 31.2 31.6 31.8  RDW 14.6 14.6 14.5  PLT 165 149* 161    Cardiac Enzymes Recent Labs  Lab 10/30/17 0023 10/30/17 0552 10/30/17 1152  TROPONINI <0.03 <0.03 <0.03    Recent Labs  Lab 10/29/17 1856  TROPIPOC 0.00     BNP Recent Labs  Lab 10/29/17 1840  BNP 539.2*  DDimer No results for input(s): DDIMER in the last 168 hours.   Radiology    No results found.  Cardiac Studies   Echo   8/27 Study Conclusions  - Left ventricle: The cavity size was mildly dilated. Wall   thickness was normal. The estimated ejection fraction was 25%.   Diffuse hypokinesis. The study was not technically sufficient to   allow evaluation of LV diastolic dysfunction due to atrial   fibrillation. - Aortic valve: There was no stenosis. There was trivial   regurgitation. - Aorta: Mildly dilated aortic root. Aortic root dimension: 37 mm   (ED). - Mitral valve: There was trivial regurgitation. - Left atrium: The atrium was moderately dilated. - Right ventricle: The cavity size was normal. Systolic function   was moderately reduced. - Right atrium: The atrium was mildly dilated. - Tricuspid valve: Peak RV-RA gradient (S): 31 mm Hg. - Pulmonary arteries: PA peak pressure: 34 mm Hg (S). - Inferior vena cava: The vessel was normal in size. The   respirophasic diameter changes were in the normal range (>= 50%),   consistent with normal central venous pressure. - Pericardium, extracardiac: A trivial pericardial effusion was   identified.  Impressions:  -  The patient was in atrial fibrillation. Mildly dilated LV with EF   25%, diffuse hypokinesis. Normal RV size with moderately   decreased systolic function. No significant valvular   abnormalities.    Patient Profile     72 y.o. male with histroy of systolic/diastolic CHF, PAF, HTN, HL , seizures, PAD, morbid obesity whow as admitted with SOB and rapid atrial fib    Assessment & Plan    1  Acute on chronic systolic CHF   Echo  shows LVEF is worse than previous    (40 to 45% in Feb 2018)   May be tachycardic mediated worsening     Pt denies CP   Continues to diurese   Still with increased volume on exam    I would recomm continuing IV lasix  Will increase to 80 bid    Note that entresto ordered for tomorrow    2   Atrial fibrillation   Rates are improved from yesterday   Continue meds  Continue coumadin  3  HTN  Follow BP      4  HL    Check lipids in AM    For questions or updates, please contact [facility] Please consult [email or web address] for contact info under Cardiology/STEMI.      Signed, Dorris Carnes, MD  11/01/2017, 10:41 AM

## 2017-11-01 NOTE — Addendum Note (Signed)
Addended by: Oswaldo Done on: 05/04/6008 12:02 PM   Modules accepted: Level of Service, SmartSet

## 2017-11-02 DIAGNOSIS — E785 Hyperlipidemia, unspecified: Secondary | ICD-10-CM

## 2017-11-02 DIAGNOSIS — I5023 Acute on chronic systolic (congestive) heart failure: Secondary | ICD-10-CM

## 2017-11-02 LAB — PROTIME-INR
INR: 2.33
PROTHROMBIN TIME: 25.4 s — AB (ref 11.4–15.2)

## 2017-11-02 LAB — CBC
HEMATOCRIT: 45.7 % (ref 39.0–52.0)
Hemoglobin: 14.7 g/dL (ref 13.0–17.0)
MCH: 29.4 pg (ref 26.0–34.0)
MCHC: 32.2 g/dL (ref 30.0–36.0)
MCV: 91.4 fL (ref 78.0–100.0)
PLATELETS: 175 10*3/uL (ref 150–400)
RBC: 5 MIL/uL (ref 4.22–5.81)
RDW: 14.4 % (ref 11.5–15.5)
WBC: 11.2 10*3/uL — AB (ref 4.0–10.5)

## 2017-11-02 LAB — MAGNESIUM: Magnesium: 1.9 mg/dL (ref 1.7–2.4)

## 2017-11-02 LAB — BASIC METABOLIC PANEL
Anion gap: 9 (ref 5–15)
BUN: 16 mg/dL (ref 8–23)
CHLORIDE: 99 mmol/L (ref 98–111)
CO2: 32 mmol/L (ref 22–32)
Calcium: 8.8 mg/dL — ABNORMAL LOW (ref 8.9–10.3)
Creatinine, Ser: 1.03 mg/dL (ref 0.61–1.24)
GFR calc Af Amer: >60 mL/min (ref >60)
GFR calc non Af Amer: >60 mL/min (ref >60)
GLUCOSE: 107 mg/dL — AB (ref 70–99)
POTASSIUM: 3.6 mmol/L (ref 3.5–5.1)
Sodium: 140 mmol/L (ref 135–145)

## 2017-11-02 LAB — LIPID PANEL
Cholesterol: 130 mg/dL (ref 0–200)
HDL: 31 mg/dL — ABNORMAL LOW (ref >40)
LDL Cholesterol: 83 mg/dL (ref 0–99)
Total CHOL/HDL Ratio: 4.2 RATIO
Triglycerides: 81 mg/dL (ref <150)
VLDL: 16 mg/dL (ref 0–40)

## 2017-11-02 MED ORDER — WARFARIN SODIUM 2 MG PO TABS
4.0000 mg | ORAL_TABLET | ORAL | Status: DC
  Administered 2017-11-02 – 2017-11-03 (×2): 4 mg via ORAL
  Filled 2017-11-02 (×3): qty 2

## 2017-11-02 MED ORDER — METOPROLOL TARTRATE 50 MG PO TABS
50.0000 mg | ORAL_TABLET | Freq: Four times a day (QID) | ORAL | Status: DC
  Administered 2017-11-02 – 2017-11-03 (×4): 50 mg via ORAL
  Filled 2017-11-02 (×4): qty 1

## 2017-11-02 MED ORDER — WARFARIN SODIUM 5 MG PO TABS: 5.0000 mg | ORAL_TABLET | ORAL | Status: DC

## 2017-11-02 NOTE — Care Management Important Message (Signed)
Important Message  Patient Details  Name: JAHKING LESSER MRN: [id number] Date of Birth: 1946-01-08   Medicare Important Message Given:  Yes    Imogene Gravelle P Jervis Trapani 11/02/2017, 4:18 PM

## 2017-11-02 NOTE — Progress Notes (Addendum)
Progress Note  Patient Name: Ruben Murray Date of Encounter: 11/02/2017  Primary Cardiologist: [name], MD   Subjective   Breathing is good   No CP    Inpatient Medications    Scheduled Meds: . bisoprolol  10 mg Oral Daily  . digoxin  0.25 mg Oral Daily  . furosemide  80 mg Intravenous BID  . mouth rinse  15 mL Mouth Rinse BID  . pantoprazole  40 mg Oral BID  . PHENobarbital  97.2 mg Oral q morning - 10a  . potassium chloride  20 mEq Oral BID  . sacubitril-valsartan  1 tablet Oral BID  . sodium chloride flush  3 mL Intravenous Q12H  . tamsulosin  0.4 mg Oral QPC supper  . Warfarin - Pharmacist Dosing Inpatient   Does not apply q1800   Continuous Infusions: . sodium chloride     PRN Meds:  Vital Signs    Vitals:   11/01/17 1559 11/01/17 2026 11/02/17 0435 11/02/17 0812  BP: 115/77 130/80 114/66   Pulse: 88 100 (!) 59 (!) 135  Resp: _0 Temp: 99.1 F (37.3 C) 99.6 F (37.6 C) 97.7 F (36.5 C)   TempSrc: Oral Oral Oral   SpO2: 97% 94% 92%   Weight:   114.6 kg   Height:        Intake/Output Summary (Last 24 hours) at 11/02/2017 0850 Last data filed at 11/02/2017 0815 Gross per 24 hour  Intake 240 ml  Output 5150 ml  Net -4910 ml   Net negative 9 L  Filed Weights   10/31/17 0455 11/01/17 0459 11/02/17 0435  Weight: 130 kg 128.1 kg 114.6 kg    Telemetry    Afib  99s to 140s  - Personally Reviewed  ECG      Physical Exam   GEN: No acute distress.    Morbidly obese   Neck: JVP is normal   Cardiac: Irreg irreg    no murmurs, rubs, or gallops.  Respiratory: Clear to auscultation bilaterally. GI: Soft, nontender, non-distended  MS: Triv edema; No deformity. Neuro:  Nonfocal  Psych: Normal affect   Labs    Chemistry Recent Labs  Lab 10/30/17 0552 10/31/17 0514 11/01/17 0428 11/02/17 0425  NA 143 142 139 140  K 3.4* 4.2 3.9 3.6  CL 105 106 101 99  CO2 _1 32  GLUCOSE 103* 101* 103* 107*  BUN _2 CREATININE  0.97 0.94 1.00 1.03  CALCIUM 8.4* 8.5* 8.5* 8.8*  PROT 5.9*  --   --   --   ALBUMIN 3.4*  --   --   --   AST 20  --   --   --   ALT 19  --   --   --   ALKPHOS 96  --   --   --   BILITOT 1.0  --   --   --   GFRNONAA >60 >60 >60 >60  GFRAA >60 >60 >60 >60  ANIONGAP _3 Hematology Recent Labs  Lab 10/30/17 0552 10/31/17 0514 11/02/17 0425  WBC 8.6 8.7 11.2*  RBC 4.46 4.60 5.00  HGB 13.1 13.6 14.7  HCT 41.4 42.8 45.7  MCV 92.8 93.0 91.4  MCH 29.4 29.6 29.4  MCHC 31.6 31.8 32.2  RDW 14.6 14.5 14.4  PLT 149* 161 175    Cardiac Enzymes Recent Labs  Lab 10/30/17 0023 10/30/17 0552 10/30/17  [place] <0.03 <0.03 <0.03    Recent Labs  Lab 10/29/17 1856  TROPIPOC 0.00     BNP Recent Labs  Lab 10/29/17 1840  BNP 539.2*     DDimer No results for input(s): DDIMER in the last 168 hours.   Radiology    No results found.  Cardiac Studies   Echo   8/27 Study Conclusions  - Left ventricle: The cavity size was mildly dilated. Wall   thickness was normal. The estimated ejection fraction was 25%.   Diffuse hypokinesis. The study was not technically sufficient to   allow evaluation of LV diastolic dysfunction due to atrial   fibrillation. - Aortic valve: There was no stenosis. There was trivial   regurgitation. - Aorta: Mildly dilated aortic root. Aortic root dimension: 37 mm   (ED). - Mitral valve: There was trivial regurgitation. - Left atrium: The atrium was moderately dilated. - Right ventricle: The cavity size was normal. Systolic function   was moderately reduced. - Right atrium: The atrium was mildly dilated. - Tricuspid valve: Peak RV-RA gradient (S): 31 mm Hg. - Pulmonary arteries: PA peak pressure: 34 mm Hg (S). - Inferior vena cava: The vessel was normal in size. The   respirophasic diameter changes were in the normal range (>= 50%),   consistent with normal central venous pressure. - Pericardium, extracardiac: A trivial  pericardial effusion was   identified.  Impressions:  - The patient was in atrial fibrillation. Mildly dilated LV with EF   25%, diffuse hypokinesis. Normal RV size with moderately   decreased systolic function. No significant valvular   abnormalities.    Patient Profile     72 y.o. male with histroy of systolic/diastolic CHF, PAF, HTN, HL , seizures, PAD, morbid obesity whow as admitted with SOB and rapid atrial fib    Assessment & Plan    1  Acute on chronic systolic CHF   Echo  shows LVEF is worse than before   May be tachy induced    SInce admit pt has diuresed significantly   Exam much improved   Ptob still has some volume increase but not severe   Continue b blocker and Entresto   Spent a long time with diet education    2   Atrial fibrillation Heart rate is up/down    Average is 90s    Will switch Zebeta to metoprolol to allow for more frequent dosing and titration    Pt on coumadin   INR is checked by Dr Alyson Ingles   Per pt last one was 2.1-2.2 about 4 wks ago   If rates cannot be controlled and  cardioversion considiered would prob rec  TEE prior  If rates OK then would check INR weekly and perform in 3 wks    3  HTN  Follow BP      4  HL    Check lipids in AM    For questions or updates, please contact [facility] Please consult [email or web address] for contact info under Cardiology/STEMI.      Signed, Dorris Carnes, MD  11/02/2017, 8:50 AM

## 2017-11-02 NOTE — Progress Notes (Signed)
ANTICOAGULATION CONSULT NOTE - Follow Up Consult  Pharmacy Consult for Coumadin Indication: atrial fibrillation  Patient Measurements: Height: 5' 8" (172.7 cm) Weight: 252 lb 9.6 oz (114.6 kg)(scale b) IBW/kg (Calculated) : 68.4  Vital Signs: Temp: 97.7 F (36.5 C) (08/30 0435) Temp Source: Oral (08/30 0435) BP: 114/66 (08/30 0435) Pulse Rate: 108 (08/30 0958)  Labs: Recent Labs    10/30/17 1152 10/31/17 0514 11/01/17 0428 11/02/17 0425  HGB  --  13.6  --  14.7  HCT  --  42.8  --  45.7  PLT  --  161  --  175  LABPROT  --   --  25.4* 25.4*  INR  --   --  2.33 2.33  CREATININE  --  0.94 1.00 1.03  TROPONINI <0.03  --   --   --     Estimated Creatinine Clearance: 77.3 mL/min (by C-G formula based on SCr of 1.03 mg/dL).   Assessment:  72 yr old male continues on Coumadin as prior to admission for atrial fibrillation.   INR therapeutic on admit (2.19) and remains therapeutic at 2.33 x 2 on home regimen of  4 mg daily except 5 mg on Wednesdays and Sundays.  Goal of Therapy:  INR 2-3 Monitor platelets by anticoagulation protocol: Yes   Plan:   Continue Coumadin 4 mg daily except 5 mg on Wednesdays and Sundays.  Change PT/INR from daily to MWF.    Arty Baumgartner, [place] Pager: [phone] or phone: [phone] 11/02/2017,10:17 AM

## 2017-11-02 NOTE — Progress Notes (Signed)
PROGRESS NOTE                                                                                                                                                                                                             Patient Demographics:    Ruben Murray, is a 72 y.o. male, DOB - 02/10/1946, XOV:[id number]  Admit date - 10/29/2017   Admitting Physician Toy Baker, MD  Outpatient Primary MD for the patient is [name], MD  LOS - 4  Outpatient Specialists: Dr. [name]  Chief Complaint  Patient presents with  . Atrial Fibrillation       Brief Narrative 72 year old obese male with history of A. fib on Coumadin, OSA, systolic CHF with EF of 00-45%, seizure disorder presented to the ED with increasing shortness of breath and intermittent chest pain for past 2 weeks.  He also developed increasing leg swelling and abdominal distention.  Patient found to be in acute on chronic systolic CHF along with A. fib with RVR.   Subjective:   Heart rate jumped to 130s this morning.  Reports dyspnea to have improved but still short of breath on exertion   Assessment  & Plan :   Principal problem Atrial fibrillation with RVR Heart rate elevated to 130s-140s this morning.  Already on maximum dose of bisoprolol.  Cardiology switched to metoprolol today in order to titrate dosing better. Continue digoxin. Plan on cardioversion in few weeks.  Continue Coumadin.   Acute on chronic combined systolic and diastolic CHF (HCC) Possibly triggered by rapid A. fib.  2D echo with worsening EF of 25% with diffuse hypokinesis.  Has diuresed quite well on IV Lasix with negative balance of 8.8 L but still overloaded.  Continue current dose of Lasix. Started on Entresto this morning.  Essential hypertension Continue Lasix, beta-blocker.  Holding losartan and amlodipine.  HCTZ discontinued.   Seizure disorder Continue  phenobarbital  OSA Needs outpatient sleep study      Code Status : DNR  Family Communication  : None at bedside  Disposition Plan  : Home possibly in the next 48 hours if diuresed well and heart rate stable.  Patient insisted on being discharged home today but agreed to stay back for 1 night.  Patient says that he must be discharged tomorrow.  Barriers For Discharge : Active symptoms  Consults  :  Cardiology  Procedures  : 2D echo  DVT Prophylaxis  : Coumadin  Lab Results  Component Value Date   PLT 175 11/02/2017    Antibiotics  :    Anti-infectives (From admission, onward)   None        Objective:   Vitals:   11/02/17 0435 11/02/17 0812 11/02/17 0958 11/02/17 1137  BP: 114/66   (!) 92/56  Pulse: (!) 59 (!) 135 (!) 108 65  Resp: 18   20  Temp: 97.7 F (36.5 C)   98.4 F (36.9 C)  TempSrc: Oral   Oral  SpO2: 92%   93%  Weight: 114.6 kg     Height:        Wt Readings from Last 3 Encounters:  11/02/17 114.6 kg  10/05/16 132.1 kg  08/31/16 131.2 kg     Intake/Output Summary (Last 24 hours) at 11/02/2017 1430 Last data filed at 11/02/2017 1314 Gross per 24 hour  Intake 840 ml  Output 4375 ml  Net -3535 ml     Physical Exam  Gen: not in distress HEENT:  moist mucosa, supple neck Chest: Fine bibasilar crackles, CVS: S1 and S2 irregular, no murmurs GI: soft, NT, ND, BS+ Musculoskeletal: warm, trace pitting edema bilateral     Data Review:    CBC Recent Labs  Lab 10/29/17 1840 10/30/17 0552 10/31/17 0514 11/02/17 0425  WBC 8.8 8.6 8.7 11.2*  HGB 14.1 13.1 13.6 14.7  HCT 45.2 41.4 42.8 45.7  PLT 165 149* 161 175  MCV 94.6 92.8 93.0 91.4  MCH 29.5 29.4 29.6 29.4  MCHC 31.2 31.6 31.8 32.2  RDW 14.6 14.6 14.5 14.4  LYMPHSABS  --  2.5  --   --   MONOABS  --  0.8  --   --   EOSABS  --  0.2  --   --   BASOSABS  --  0.0  --   --     Chemistries  Recent Labs  Lab 10/29/17 1840 10/30/17 0552 10/31/17 0514 11/01/17 0428  11/02/17 0425  NA 143 143 142 139 140  K 3.8 3.4* 4.2 3.9 3.6  CL 106 105 106 101 99  CO2 _0 32  GLUCOSE 110* 103* 101* 103* 107*  BUN _1 CREATININE 0.94 0.97 0.94 1.00 1.03  CALCIUM 8.8* 8.4* 8.5* 8.5* 8.8*  MG 1.9 1.8  --  1.8 1.9  AST  --  20  --   --   --   ALT  --  19  --   --   --   ALKPHOS  --  96  --   --   --   BILITOT  --  1.0  --   --   --    ------------------------------------------------------------------------------------------------------------------ Recent Labs    11/02/17 0425  CHOL 130  HDL 31*  LDLCALC 83  TRIG 81  CHOLHDL 4.2    No results found for: HGBA1C ------------------------------------------------------------------------------------------------------------------ No results for input(s): TSH, T4TOTAL, T3FREE, THYROIDAB in the last 72 hours.  Invalid input(s): FREET3 ------------------------------------------------------------------------------------------------------------------ No results for input(s): VITAMINB12, FOLATE, FERRITIN, TIBC, IRON, RETICCTPCT in the last 72 hours.  Coagulation profile Recent Labs  Lab 10/29/17 1840 10/30/17 0552 11/01/17 0428 11/02/17 0425  INR 2.10 2.29 2.33 2.33    No results for input(s): DDIMER in the last 72 hours.  Cardiac Enzymes Recent Labs  Lab 10/30/17 0023 10/30/17 0552 10/30/17 1152  TROPONINI <0.03 <0.03 <0.03   ------------------------------------------------------------------------------------------------------------------  Component Value Date/Time   BNP 539.2 (H) 10/29/2017 1840    Inpatient Medications  Scheduled Meds: . digoxin  0.25 mg Oral Daily  . furosemide  80 mg Intravenous BID  . mouth rinse  15 mL Mouth Rinse BID  . metoprolol tartrate  50 mg Oral QID  . pantoprazole  40 mg Oral BID  . PHENobarbital  97.2 mg Oral q morning - 10a  . potassium chloride  20 mEq Oral BID  . sacubitril-valsartan  1 tablet Oral BID  . sodium chloride flush  3  mL Intravenous Q12H  . tamsulosin  0.4 mg Oral QPC supper  . warfarin  4 mg Oral Once per day on Mon Tue Thu Fri Sat   And  . [START ON 11/04/2017] warfarin  5 mg Oral Once per day on Sun Wed  . Warfarin - Pharmacist Dosing Inpatient   Does not apply q1800   Continuous Infusions: . sodium chloride     PRN Meds:.sodium chloride, acetaminophen **OR** acetaminophen, polyethylene glycol, sodium chloride flush  Micro Results No results found for this or any previous visit (from the past 240 hour(s)).  Radiology Reports Dg Chest 2 View  Result Date: 10/29/2017 CLINICAL DATA:  RIGHT leg pain and swelling, shortness of breath and atrial fibrillation. EXAM: CHEST - 2 VIEW COMPARISON:  07/22/2016. FINDINGS: Cardiomegaly. No consolidation or edema. Mild scarring LEFT base. No effusion or pneumothorax. Bones unremarkable. IMPRESSION: Cardiomegaly increased from priors.  No active disease. Electronically Signed   By: Staci Righter M.D.   On: 10/29/2017 18:49    Time Spent in minutes 35   Elasia Furnish M.D on 11/02/2017 at 2:30 PM  Between 7am to 7pm - Pager - [phone]  After 7pm go to [email or web address] - password [facility]  [facility] -  Office  [phone]

## 2017-11-02 NOTE — Progress Notes (Signed)
Pt was in and out performed with myself and Draya the Nurse tech. Patient tolerated very well with 1500cc removed. External cath reapplied by tech

## 2017-11-02 NOTE — Progress Notes (Signed)
Patient c/o abdominal pain and sensation of urine stopping after you start to void.  He has had condom cath on all day and I have emptied 1225 ml urine since 0800.  [place] scan showed 0 ml in all quadrants. Urine clear yellow and no odor.  Dr. Clementeen Graham notified and I & O cath ordered.  Patient and wife updated on plan of care.

## 2017-11-02 NOTE — Progress Notes (Signed)
Occupational Therapy Treatment Patient Details Name: Ruben Murray MRN: [id number] DOB: September 04, 1945 Today's Date: 11/02/2017    History of present illness 72 yo with SoB and edema with CHF exacerbation. PMHx: seizure d/o, atrial fibrillation on coumadin , sleep apnea, Systolic CHF, HTN, Lt TKA, pt reports Rt weak ankle awaiting a brace   OT comments  Pt. Progressing well with acute OT goals. Able to demonstrate LB dressing, amb. To b.room for simulated toileting tasks and also shower stall transfer.  Pt. 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 Will continue with current POC for continued safety with ADLs prior to home.    Follow Up Recommendations  Home health OT;Supervision/Assistance - 24 hour    Equipment Recommendations       Recommendations for Other Services      Precautions / Restrictions Precautions Precautions: Fall Restrictions Weight Bearing Restrictions: No       Mobility Bed Mobility               General bed mobility comments: in chair on arrival  Transfers Overall transfer level: Needs assistance Equipment used: Straight cane Transfers: Sit to/from Stand;Stand Pivot Transfers Sit to Stand: Supervision         General transfer comment: supervision for safety    Balance                                           ADL either performed or assessed with clinical judgement   ADL Overall ADL's : Needs assistance/impaired                     Lower Body Dressing: Set up;Sitting/lateral leans Lower Body Dressing Details (indicate cue type and reason): pt. reported and demonstrated ability for LB dressing by crossing each leg over knee Toilet Transfer: Supervision/safety;Grab bars Toilet Transfer Details (indicate cue type and reason): simulated in b.room (did not have to actually use the b.room)     Tub/ Shower Transfer: Walk-in shower;Supervision/safety;Ambulation;Grab bars   Functional mobility  during ADLs: Min guard       Vision       Perception     Praxis      Cognition Arousal/Alertness: Awake/alert Behavior During Therapy: Agitated Overall Cognitive Status: Within Functional Limits for tasks assessed                                 General Comments: pt. was agitated upon arrival as he wants to d/c ASAP to cont. care for his mother and was told he may not be able to go home yet.  provided emotional support        Exercises     Shoulder Instructions       General Comments      Pertinent Vitals/ Pain       Pain Assessment: No/denies pain  Home Living                                          Prior Functioning/Environment              Frequency           Progress Toward Goals  OT Goals(current goals can now  be found in the care plan section)  Progress towards OT goals: Progressing toward goals     Plan Discharge plan remains appropriate    Co-evaluation                 AM-PAC PT "6 Clicks" Daily Activity     Outcome Measure   Help from another person eating meals?: None Help from another person taking care of personal grooming?: A Little Help from another person toileting, which includes using toliet, bedpan, or urinal?: A Little Help from another person bathing (including washing, rinsing, drying)?: A Little Help from another person to put on and taking off regular upper body clothing?: None Help from another person to put on and taking off regular lower body clothing?: A Little 6 Click Score: 20    End of Session Equipment Utilized During Treatment: Gait belt;Other (comment)  OT Visit Diagnosis: Unsteadiness on feet (R26.81);Other abnormalities of gait and mobility (R26.89);Muscle weakness (generalized) (M62.81)   Activity Tolerance     Patient Left in chair;with call bell/phone within reach;with chair alarm set   Nurse Communication          Time: [phone] OT Time Calculation  (min): 9 min  Charges: OT General Charges $OT Visit: 1 Visit OT Treatments $Self Care/Home Management : 8-22 mins   Janice Coffin, COTA/L 11/02/2017, 9:42 AM

## 2017-11-02 NOTE — Progress Notes (Signed)
Physical Therapy Treatment Patient Details Name: Ruben Murray MRN: [id number] DOB: 1945/11/23 Today's Date: 11/02/2017    History of Present Illness 72 yo with SoB and edema with CHF exacerbation. PMHx: seizure d/o, atrial fibrillation on coumadin , sleep apnea, Systolic CHF, HTN, Lt TKA, pt reports Rt weak ankle awaiting a brace    PT Comments    Pt pleasant on arrival, saying "finally slept last night." Pt able to progress ambulation to 300 ft min guard w/ single point cane and HR ranging 115-135 throughout. Pt also reports less SOB while walking. Pt feels this distance and overall steadiness is about baseline. Pt eager to return home to care for mother.        Follow Up Recommendations  Home health PT     Equipment Recommendations  None recommended by PT    Recommendations for Other Services       Precautions / Restrictions Precautions Precautions: Fall    Mobility  Bed Mobility               General bed mobility comments: in chair on arrival  Transfers Overall transfer level: Needs assistance   Transfers: Sit to/from Stand Sit to Stand: Supervision         General transfer comment: supervision for safety  Ambulation/Gait Ambulation/Gait assistance: Min guard Gait Distance (Feet): 300 Feet Assistive device: Straight cane Gait Pattern/deviations: Step-through pattern;Wide base of support   Gait velocity interpretation: >2.62 ft/sec, indicative of community ambulatory General Gait Details: pt use of single point cane today overall steady with occasional sway able to self correct. HR 115-135 with gait          Cognition Arousal/Alertness: Awake/alert Behavior During Therapy: WFL for tasks assessed/performed Overall Cognitive Status: Within Functional Limits for tasks assessed                                        Exercises      General Comments        Pertinent Vitals/Pain Pain Assessment: No/denies pain            PT Goals (current goals can now be found in the care plan section) Progress towards PT goals: Progressing toward goals    Frequency           PT Plan Current plan remains appropriate    Co-evaluation              AM-PAC PT "6 Clicks" Daily Activity  Outcome Measure  Difficulty turning over in bed (including adjusting bedclothes, sheets and blankets)?: A Little Difficulty moving from lying on back to sitting on the side of the bed? : A Little Difficulty sitting down on and standing up from a chair with arms (e.g., wheelchair, bedside commode, etc,.)?: A Little Help needed moving to and from a bed to chair (including a wheelchair)?: A Little Help needed walking in hospital room?: A Little Help needed climbing 3-5 steps with a railing? : A Little 6 Click Score: 18    End of Session Equipment Utilized During Treatment: Gait belt Activity Tolerance: Patient tolerated treatment well Patient left: with call bell/phone within reach;with nursing/sitter in room;in chair;with chair alarm set Nurse Communication: Mobility status PT Visit Diagnosis: Other abnormalities of gait and mobility (R26.89);Muscle weakness (generalized) (M62.81)     Time: [id number] PT Time Calculation (min) (ACUTE ONLY): 17 min  Charges:  $Gait Training: 8-22 mins  Samuella Bruin, [place]  Acute Rehab [phone]    Samuella Bruin 11/02/2017, 8:24 AM

## 2017-11-03 LAB — BASIC METABOLIC PANEL
ANION GAP: 10 (ref 5–15)
BUN: 19 mg/dL (ref 8–23)
CHLORIDE: 99 mmol/L (ref 98–111)
CO2: 30 mmol/L (ref 22–32)
Calcium: 8.8 mg/dL — ABNORMAL LOW (ref 8.9–10.3)
Creatinine, Ser: 1.08 mg/dL (ref 0.61–1.24)
GFR calc non Af Amer: >60 mL/min (ref >60)
Glucose, Bld: 117 mg/dL — ABNORMAL HIGH (ref 70–99)
POTASSIUM: 3.7 mmol/L (ref 3.5–5.1)
SODIUM: 139 mmol/L (ref 135–145)

## 2017-11-03 MED ORDER — METOPROLOL TARTRATE 100 MG PO TABS
100.0000 mg | ORAL_TABLET | Freq: Two times a day (BID) | ORAL | Status: DC
  Administered 2017-11-03 – 2017-11-04 (×2): 100 mg via ORAL
  Filled 2017-11-03 (×2): qty 1

## 2017-11-03 MED ORDER — FUROSEMIDE 80 MG PO TABS
80.0000 mg | ORAL_TABLET | Freq: Two times a day (BID) | ORAL | Status: DC
  Administered 2017-11-03 – 2017-11-04 (×2): 80 mg via ORAL
  Filled 2017-11-03 (×2): qty 1

## 2017-11-03 NOTE — Progress Notes (Signed)
Progress Note  Patient Name: Ruben Murray Date of Encounter: 11/03/2017  Primary Cardiologist: [name], MD    Patient Profile     72 y.o. male with histroy of systolic/diastolic CHF, PAF, HTN, HL , seizures, PAD, morbid obesity whow as admitted with SOB and recurrent rapid atrial fib; not on DOAC secondary to interaction with phenobarbital rate control treated with increased beta-blockers and digoxin.  Entresto added 2/18 EF 40-45% 8/27--echo EF 25%, LAD-moderate  Subjective   Without SOB  Up and about  No chest pain   Inpatient Medications    Scheduled Meds: . digoxin  0.25 mg Oral Daily  . furosemide  80 mg Intravenous BID  . mouth rinse  15 mL Mouth Rinse BID  . metoprolol tartrate  50 mg Oral QID  . pantoprazole  40 mg Oral BID  . PHENobarbital  97.2 mg Oral q morning - 10a  . potassium chloride  20 mEq Oral BID  . sacubitril-valsartan  1 tablet Oral BID  . sodium chloride flush  3 mL Intravenous Q12H  . tamsulosin  0.4 mg Oral QPC supper  . warfarin  4 mg Oral Once per day on Mon Tue Thu Fri Sat   And  . [START ON 11/04/2017] warfarin  5 mg Oral Once per day on Sun Wed  . Warfarin - Pharmacist Dosing Inpatient   Does not apply q1800   Continuous Infusions: . sodium chloride     PRN Meds:  Vital Signs    Vitals:   11/02/17 2022 11/02/17 2127 11/03/17 0510 11/03/17 0517  BP: (!) 107/58  102/74   Pulse: (!) 58 92 (!) 102   Resp: 16  18   Temp: 99.3 F (37.4 C)  98.1 F (36.7 C)   TempSrc: Oral  Oral   SpO2: 95%  93%   Weight:    124 kg  Height:        Intake/Output Summary (Last 24 hours) at 11/03/2017 1202 Last data filed at 11/03/2017 0851 Gross per 24 hour  Intake 960 ml  Output 3225 ml  Net -2265 ml   Net negative 9 L  Filed Weights   11/01/17 0459 11/02/17 0435 11/03/17 0517  Weight: 128.1 kg 114.6 kg 124 kg    Telemetry    AFib 90-110 Personally reviewed    ECG      Physical Exam   Well developed and nourished in no acute  distress HENT normal Neck supple with JVP unable to discern Carotids brisk and full without bruits Clear Irregularly irregular rate and rhythm with controlled ventricular response, no murmurs or gallops Abd-soft with active BS without hepatomegaly No Clubbing cyanosis tr edema Skin-warm and dry A & Oriented  Grossly normal sensory and motor function   Labs    Chemistry Recent Labs  Lab 10/30/17 0552  11/01/17 0428 11/02/17 0425 11/03/17 0545  NA 143   < > 139 140 139  K 3.4*   < > 3.9 3.6 3.7  CL 105   < > 101 99 99  CO2 27   < > 26 32 30  GLUCOSE 103*   < > 103* 107* 117*  BUN 18   < > _0 CREATININE 0.97   < > 1.00 1.03 1.08  CALCIUM 8.4*   < > 8.5* 8.8* 8.8*  PROT 5.9*  --   --   --   --   ALBUMIN 3.4*  --   --   --   --  AST 20  --   --   --   --   ALT 19  --   --   --   --   ALKPHOS 96  --   --   --   --   BILITOT 1.0  --   --   --   --   GFRNONAA >60   < > >60 >60 >60  GFRAA >60   < > >60 >60 >60  ANIONGAP 11   < > _0 < > = values in this interval not displayed.     Hematology Recent Labs  Lab 10/30/17 0552 10/31/17 0514 11/02/17 0425  WBC 8.6 8.7 11.2*  RBC 4.46 4.60 5.00  HGB 13.1 13.6 14.7  HCT 41.4 42.8 45.7  MCV 92.8 93.0 91.4  MCH 29.4 29.6 29.4  MCHC 31.6 31.8 32.2  RDW 14.6 14.5 14.4  PLT 149* 161 175    Cardiac Enzymes Recent Labs  Lab 10/30/17 0023 10/30/17 0552 10/30/17 1152  TROPONINI <0.03 <0.03 <0.03    Recent Labs  Lab 10/29/17 1856  TROPIPOC 0.00     BNP Recent Labs  Lab 10/29/17 1840  BNP 539.2*     DDimer No results for input(s): DDIMER in the last 168 hours.   Radiology    No results found.  Cardiac Studies   Echo   8/27 Study Conclusions  - Left ventricle: The cavity size was mildly dilated. Wall   thickness was normal. The estimated ejection fraction was 25%.   Diffuse hypokinesis. The study was not technically sufficient to   allow evaluation of LV diastolic dysfunction due to  atrial   fibrillation. - Aortic valve: There was no stenosis. There was trivial   regurgitation. - Aorta: Mildly dilated aortic root. Aortic root dimension: 37 mm   (ED). - Mitral valve: There was trivial regurgitation. - Left atrium: The atrium was moderately dilated. - Right ventricle: The cavity size was normal. Systolic function   was moderately reduced. - Right atrium: The atrium was mildly dilated. - Tricuspid valve: Peak RV-RA gradient (S): 31 mm Hg. - Pulmonary arteries: PA peak pressure: 34 mm Hg (S). - Inferior vena cava: The vessel was normal in size. The   respirophasic diameter changes were in the normal range (>= 50%),   consistent with normal central venous pressure. - Pericardium, extracardiac: A trivial pericardial effusion was   identified.  Impressions:  - The patient was in atrial fibrillation. Mildly dilated LV with EF   25%, diffuse hypokinesis. Normal RV size with moderately   decreased systolic function. No significant valvular   abnormalities.     Assessment & Plan    Congestive heart failure-acute/chronic/diastolic now systolic  Cardiomyopathy  Acutely worse ? Rate related  Atrial fibrillation with poorly controlled ventricular response  Hypertension   Euvolemic will transition to po diuretics to day  HR reasonably controlled   Hopefully able to go home tomorrow   Will need dig level next week   Will need outpt DCCV in about 3 weeks       Signed, Virl Axe, MD  11/03/2017, 12:02 PM

## 2017-11-03 NOTE — Progress Notes (Signed)
PROGRESS NOTE    Ruben Murray  ERX:[id number] DOB: Feb 03, 1946 DOA: 10/29/2017 PCP: [name], MD     Brief Narrative:  72 year old man admitted from home on 8/26 due to increasing shortness of breath and intermittent chest pain for 2 weeks.  He was found to have A. fib with RVR and to be in acute on chronic systolic heart failure and admission was requested.   Assessment & Plan:   Active Problems:   Essential hypertension   Hyperlipidemia   Seizures (HCC)   Atrial fibrillation with RVR (HCC)   Acute on chronic combined systolic and diastolic CHF (congestive heart failure) (HCC)   CHF exacerbation (HCC)   Prolonged QT interval   Atrial fibrillation with RVR -Heart rate is reasonably well controlled in the 90s. -Continue metoprolol, cardiology is following and has not recommended any further medication dose adjustments. -Continue Coumadin as dosed per pharmacy for anticoagulation purposes.  Acute on chronic combined heart failure -2D echo this admission shows an ejection fraction of 25% with diffuse hypokinesis, not technically sufficient to evaluate LV diastolic function. -Patient is currently euvolemic, diuretics have been transitioned to p.o.  If tolerates well and has adequate diuresis, can consider discharge home in 24 hours.  Essential hypertension -Well controlled, continue Lasix, metoprolol, Entresto.  Seizure disorder -Continue phenobarbital. -No seizures while hospitalized.  Obstructive sleep apnea -Continue outpatient sleep study. -Continue metoprolol and Entresto.   DVT prophylaxis: Warfarin Code Status: DNR Family Communication: Patient only Disposition Plan: Home once medically stable, anticipate 24 hours  Consultants:   Cardiology  Procedures:   2D echo as above  Antimicrobials:  Anti-infectives (From admission, onward)   None       Subjective: Lying in bed, disgruntled that he will not be going home today, denies chest pain or  shortness of breath.  Objective: Vitals:   11/02/17 2127 11/03/17 0510 11/03/17 0517 11/03/17 1221  BP:  102/74  110/85  Pulse: 92 (!) 102  (!) 56  Resp:  18  20  Temp:  98.1 F (36.7 C)  98.1 F (36.7 C)  TempSrc:  Oral  Oral  SpO2:  93%  92%  Weight:   124 kg   Height:        Intake/Output Summary (Last 24 hours) at 11/03/2017 1254 Last data filed at 11/03/2017 0851 Gross per 24 hour  Intake 960 ml  Output 2900 ml  Net -1940 ml   Filed Weights   11/01/17 0459 11/02/17 0435 11/03/17 0517  Weight: 128.1 kg 114.6 kg 124 kg    Examination:  General exam: Alert, awake, oriented x 3 Respiratory system: Clear to auscultation. Respiratory effort normal. Cardiovascular system:RRR. No murmurs, rubs, gallops. Gastrointestinal system: Abdomen is nondistended, soft and nontender. No organomegaly or masses felt. Normal bowel sounds heard. Central nervous system: Alert and oriented. No focal neurological deficits. Extremities: No C/C/E, +pedal pulses Skin: No rashes, lesions or ulcers Psychiatry: Judgement and insight appear normal. Mood & affect appropriate.     Data Reviewed: I have personally reviewed following labs and imaging studies  CBC: Recent Labs  Lab 10/29/17 1840 10/30/17 0552 10/31/17 0514 11/02/17 0425  WBC 8.8 8.6 8.7 11.2*  NEUTROABS  --  5.1  --   --   HGB 14.1 13.1 13.6 14.7  HCT 45.2 41.4 42.8 45.7  MCV 94.6 92.8 93.0 91.4  PLT 165 149* 161 950   Basic Metabolic Panel: Recent Labs  Lab 10/29/17 1840 10/30/17 0552 10/31/17 0514 11/01/17 0428 11/02/17 0425 11/03/17  0545  NA 143 143 142 139 140 139  K 3.8 3.4* 4.2 3.9 3.6 3.7  CL 106 105 106 101 99 99  CO2 _0 32 30  GLUCOSE 110* 103* 101* 103* 107* 117*  BUN _1 CREATININE 0.94 0.97 0.94 1.00 1.03 1.08  CALCIUM 8.8* 8.4* 8.5* 8.5* 8.8* 8.8*  MG 1.9 1.8  --  1.8 1.9  --   PHOS  --  3.7  --   --   --   --    GFR: Estimated Creatinine Clearance: 76.9 mL/min (by C-G  formula based on SCr of 1.08 mg/dL). Liver Function Tests: Recent Labs  Lab 10/30/17 0552  AST 20  ALT 19  ALKPHOS 96  BILITOT 1.0  PROT 5.9*  ALBUMIN 3.4*   No results for input(s): LIPASE, AMYLASE in the last 168 hours. No results for input(s): AMMONIA in the last 168 hours. Coagulation Profile: Recent Labs  Lab 10/29/17 1840 10/30/17 0552 11/01/17 0428 11/02/17 0425  INR 2.10 2.29 2.33 2.33   Cardiac Enzymes: Recent Labs  Lab 10/30/17 0023 10/30/17 0552 10/30/17 1152  TROPONINI <0.03 <0.03 <0.03   BNP (last 3 results) No results for input(s): PROBNP in the last 8760 hours. HbA1C: No results for input(s): HGBA1C in the last 72 hours. CBG: No results for input(s): GLUCAP in the last 168 hours. Lipid Profile: Recent Labs    11/02/17 0425  CHOL 130  HDL 31*  LDLCALC 83  TRIG 81  CHOLHDL 4.2   Thyroid Function Tests: No results for input(s): TSH, T4TOTAL, FREET4, T3FREE, THYROIDAB in the last 72 hours. Anemia Panel: No results for input(s): VITAMINB12, FOLATE, FERRITIN, TIBC, IRON, RETICCTPCT in the last 72 hours. Urine analysis:    Component Value Date/Time   COLORURINE STRAW (A) 11/01/2017 1934   APPEARANCEUR CLEAR 11/01/2017 1934   LABSPEC 1.005 11/01/2017 1934   PHURINE 7.0 11/01/2017 1934   GLUCOSEU NEGATIVE 11/01/2017 1934   HGBUR NEGATIVE 11/01/2017 1934   BILIRUBINUR NEGATIVE 11/01/2017 1934   KETONESUR NEGATIVE 11/01/2017 1934   PROTEINUR NEGATIVE 11/01/2017 1934   UROBILINOGEN 0.2 05/06/2010 1010   NITRITE NEGATIVE 11/01/2017 1934   LEUKOCYTESUR NEGATIVE 11/01/2017 1934   Sepsis Labs: _2 (procalcitonin:4,lacticidven:4)  )No results found for this or any previous visit (from the past 240 hour(s)).       Radiology Studies: No results found.      Scheduled Meds: . digoxin  0.25 mg Oral Daily  . furosemide  80 mg Oral BID  . mouth rinse  15 mL Mouth Rinse BID  . metoprolol tartrate  100 mg Oral BID  . pantoprazole  40  mg Oral BID  . PHENobarbital  97.2 mg Oral q morning - 10a  . potassium chloride  20 mEq Oral BID  . sacubitril-valsartan  1 tablet Oral BID  . sodium chloride flush  3 mL Intravenous Q12H  . tamsulosin  0.4 mg Oral QPC supper  . warfarin  4 mg Oral Once per day on Mon Tue Thu Fri Sat   And  . [START ON 11/04/2017] warfarin  5 mg Oral Once per day on Sun Wed  . Warfarin - Pharmacist Dosing Inpatient   Does not apply q1800   Continuous Infusions: . sodium chloride       LOS: 5 days    Time spent: 35 minutes. Greater than 50% of this time was spent in direct contact with the patient, coordinating care and discussing relevant ongoing  clinical issues, including need to stay in the hospital for an additional 24 hours after commencing oral diuretic therapy to ensure adequate diuresis.     Lelon Frohlich, MD [facility] Pager [phone]  If 7PM-7AM, please contact night-coverage [email or web address] Password [facility] 11/03/2017, 12:54 PM

## 2017-11-04 DIAGNOSIS — R569 Unspecified convulsions: Secondary | ICD-10-CM

## 2017-11-04 DIAGNOSIS — L03818 Cellulitis of other sites: Secondary | ICD-10-CM

## 2017-11-04 DIAGNOSIS — E7849 Other hyperlipidemia: Secondary | ICD-10-CM

## 2017-11-04 LAB — BASIC METABOLIC PANEL
Anion gap: 12 (ref 5–15)
BUN: 24 mg/dL — AB (ref 8–23)
CO2: 28 mmol/L (ref 22–32)
CREATININE: 1.11 mg/dL (ref 0.61–1.24)
Calcium: 8.5 mg/dL — ABNORMAL LOW (ref 8.9–10.3)
Chloride: 98 mmol/L (ref 98–111)
GFR calc Af Amer: >60 mL/min (ref >60)
GFR calc non Af Amer: >60 mL/min (ref >60)
Glucose, Bld: 109 mg/dL — ABNORMAL HIGH (ref 70–99)
Potassium: 4 mmol/L (ref 3.5–5.1)
SODIUM: 138 mmol/L (ref 135–145)

## 2017-11-04 MED ORDER — SACUBITRIL-VALSARTAN 24-26 MG PO TABS: 1.0000 | ORAL_TABLET | Freq: Two times a day (BID) | ORAL | 0 refills | Status: DC

## 2017-11-04 MED ORDER — METOPROLOL TARTRATE 100 MG PO TABS: 100.0000 mg | ORAL_TABLET | Freq: Two times a day (BID) | ORAL | 0 refills | Status: DC

## 2017-11-04 MED ORDER — CEPHALEXIN 500 MG PO CAPS: 500.0000 mg | ORAL_CAPSULE | Freq: Three times a day (TID) | ORAL | 0 refills | Status: AC

## 2017-11-04 MED ORDER — POTASSIUM CHLORIDE CRYS ER 20 MEQ PO TBCR: 20.0000 meq | EXTENDED_RELEASE_TABLET | Freq: Every day | ORAL | 0 refills | Status: DC

## 2017-11-04 MED ORDER — FUROSEMIDE 40 MG PO TABS: 40.0000 mg | ORAL_TABLET | Freq: Every day | ORAL | Status: DC

## 2017-11-04 MED ORDER — CEPHALEXIN 250 MG PO CAPS
500.0000 mg | ORAL_CAPSULE | Freq: Three times a day (TID) | ORAL | Status: DC
  Administered 2017-11-04: 500 mg via ORAL
  Filled 2017-11-04: qty 2

## 2017-11-04 MED ORDER — FUROSEMIDE 40 MG PO TABS: 40.0000 mg | ORAL_TABLET | Freq: Every day | ORAL | 0 refills | Status: DC

## 2017-11-04 MED ORDER — DIGOXIN 250 MCG PO TABS: 0.2500 mg | ORAL_TABLET | Freq: Every day | ORAL | 0 refills | Status: DC

## 2017-11-04 MED ORDER — TAMSULOSIN HCL 0.4 MG PO CAPS: 0.4000 mg | ORAL_CAPSULE | Freq: Every day | ORAL | 0 refills | Status: AC

## 2017-11-04 NOTE — Progress Notes (Signed)
Progress Note  Patient Name: Ruben Murray Date of Encounter: 11/04/2017  Primary Cardiologist: [name], MD    Patient Profile     72 y.o. male with histroy of systolic/diastolic CHF, PAF, HTN, HL , seizures, PAD, morbid obesity whow as admitted with SOB and recurrent rapid atrial fib; not on DOAC secondary to interaction with phenobarbital rate control treated with increased beta-blockers and digoxin.  Entresto added 2/18 EF 40-45% 8/27--echo EF 25%, LAD-moderate  Subjective  Without chest pain or shortnes of breath  Urinary retention overnight Inpatient Medications    Scheduled Meds: . digoxin  0.25 mg Oral Daily  . furosemide  80 mg Oral BID  . mouth rinse  15 mL Mouth Rinse BID  . metoprolol tartrate  100 mg Oral BID  . pantoprazole  40 mg Oral BID  . PHENobarbital  97.2 mg Oral q morning - 10a  . potassium chloride  20 mEq Oral BID  . sacubitril-valsartan  1 tablet Oral BID  . sodium chloride flush  3 mL Intravenous Q12H  . tamsulosin  0.4 mg Oral QPC supper  . warfarin  4 mg Oral Once per day on Mon Tue Thu Fri Sat   And  . warfarin  5 mg Oral Once per day on Sun Wed  . Warfarin - Pharmacist Dosing Inpatient   Does not apply q1800   Continuous Infusions: . sodium chloride     PRN Meds:  Vital Signs    Vitals:   11/03/17 0517 11/03/17 1221 11/03/17 2104 11/04/17 0550  BP:  110/85 109/64 93/67  Pulse:  (!) 56 93 60  Resp:  _0 Temp:  98.1 F (36.7 C) (!) 97.5 F (36.4 C) 97.8 F (36.6 C)  TempSrc:  Oral Oral Oral  SpO2:  92% 92% 93%  Weight: 124 kg   123.7 kg  Height:        Intake/Output Summary (Last 24 hours) at 11/04/2017 0919 Last data filed at 11/04/2017 0338 Gross per 24 hour  Intake 840 ml  Output 2200 ml  Net -1360 ml   Net negative 9 L  Filed Weights   11/02/17 0435 11/03/17 0517 11/04/17 0550  Weight: 114.6 kg 124 kg 123.7 kg    Telemetry    Afib Personally reviewed    ECG      Physical Exam  Well developed and  nourished in no acute distress HENT normal Neck supple with JVP-flat Carotids brisk and full without bruits Clear Irregularly irregular rate and rhythm with controlled ventricular response, no murmurs or gallops Abd-soft with active BS without hepatomegaly No Clubbing cyanosis edema tr Skin-warm and dry A & Oriented  Grossly normal sensory and motor function   Labs    Chemistry Recent Labs  Lab 10/30/17 0552  11/02/17 0425 11/03/17 0545 11/04/17 0653  NA 143   < > 140 139 138  K 3.4*   < > 3.6 3.7 4.0  CL 105   < > 99 99 98  CO2 27   < > 32 30 28  GLUCOSE 103*   < > 107* 117* 109*  BUN 18   < > 16 19 24*  CREATININE 0.97   < > 1.03 1.08 1.11  CALCIUM 8.4*   < > 8.8* 8.8* 8.5*  PROT 5.9*  --   --   --   --   ALBUMIN 3.4*  --   --   --   --   AST 20  --   --   --   --  ALT 19  --   --   --   --   ALKPHOS 96  --   --   --   --   BILITOT 1.0  --   --   --   --   GFRNONAA >60   < > >60 >60 >60  GFRAA >60   < > >60 >60 >60  ANIONGAP 11   < > _0 < > = values in this interval not displayed.     Hematology Recent Labs  Lab 10/30/17 0552 10/31/17 0514 11/02/17 0425  WBC 8.6 8.7 11.2*  RBC 4.46 4.60 5.00  HGB 13.1 13.6 14.7  HCT 41.4 42.8 45.7  MCV 92.8 93.0 91.4  MCH 29.4 29.6 29.4  MCHC 31.6 31.8 32.2  RDW 14.6 14.5 14.4  PLT 149* 161 175    Cardiac Enzymes Recent Labs  Lab 10/30/17 0023 10/30/17 0552 10/30/17 1152  TROPONINI <0.03 <0.03 <0.03    Recent Labs  Lab 10/29/17 1856  TROPIPOC 0.00     BNP Recent Labs  Lab 10/29/17 1840  BNP 539.2*     DDimer No results for input(s): DDIMER in the last 168 hours.   Radiology    No results found.  Cardiac Studies   Echo   8/27 Study Conclusions  - Left ventricle: The cavity size was mildly dilated. Wall   thickness was normal. The estimated ejection fraction was 25%.   Diffuse hypokinesis. The study was not technically sufficient to   allow evaluation of LV diastolic dysfunction due  to atrial   fibrillation. - Aortic valve: There was no stenosis. There was trivial   regurgitation. - Aorta: Mildly dilated aortic root. Aortic root dimension: 37 mm   (ED). - Mitral valve: There was trivial regurgitation. - Left atrium: The atrium was moderately dilated. - Right ventricle: The cavity size was normal. Systolic function   was moderately reduced. - Right atrium: The atrium was mildly dilated. - Tricuspid valve: Peak RV-RA gradient (S): 31 mm Hg. - Pulmonary arteries: PA peak pressure: 34 mm Hg (S). - Inferior vena cava: The vessel was normal in size. The   respirophasic diameter changes were in the normal range (>= 50%),   consistent with normal central venous pressure. - Pericardium, extracardiac: A trivial pericardial effusion was   identified.  Impressions:  - The patient was in atrial fibrillation. Mildly dilated LV with EF   25%, diffuse hypokinesis. Normal RV size with moderately   decreased systolic function. No significant valvular   abnormalities.     Assessment & Plan    Congestive heart failure-acute/chronic/diastolic now systolic  Cardiomyopathy  Acutely worse ? Rate related  Atrial fibrillation with poorly controlled ventricular response  Hypertension  Cellulitis/superficial thrombophlebitis 2/2 IV site  cellulitis  will begin cephalexin   Euvolemic stable on PO diuretics  HR reasonably controlled ( RACE2 allows 110)   Will need dig level next week   Will need outpt DCCV in about 3 weeks   Can go home from cardiology point of view,  Will need to be able to urinate      Signed, Virl Axe, MD  11/04/2017, 9:19 AM

## 2017-11-04 NOTE — Progress Notes (Signed)
Patient was bladder scanned total of 264m.   I went over "living better" booklet with patient and patients wife.  Allowed time for questions to be asked, patient demonstrated understanding.  Went over AVS information. Understanding of medications were verbalized by patient and family member.  CCMD was notified of leads off the patient.  IV has been removed. No chest pain. Patient ready for discharge and conversing with wife.

## 2017-11-04 NOTE — Discharge Summary (Signed)
Physician Discharge Summary  Ruben Murray SHF:[id number] DOB: 1946/01/30 DOA: 10/29/2017  PCP: [name], MD  Admit date: 10/29/2017 Discharge date: 11/04/2017  Admitted From: Home  Disposition:  Home   Recommendations for Outpatient Follow-up and new medication changes:  1. Follow up with Dr. Alyson Ingles in 7 days.  2. Bisoprolol and amlodipine were discontinued and patient was placed on 100 mg of metoprolol bid. 3. Losartan-HCTZ was discontinued and patient was placed on sacubitril-valsartan combination.  4. Tamsulosin has been resumed.  5. Started furosemide 40 mg daily along with 20 meq kcl daily, follow bmp in 7 days.   6. Plan for possible direct current cardioversion in 3 weeks.  7.  Patient will need digoxin level in one week  Home Health: yes   Equipment/Devices: no    Discharge Condition: stable  CODE STATUS: full  Diet recommendation: heart healthy   Brief/Interim Summary: 72 year old male who presented with dyspnea and chest pain.  He does have the significant past medical history for atrial fibrillation, obstructive sleep apnea, seizures and diastolic heart failure.  Patient reported 2 weeks of worsening dyspnea and intermittent chest pain, associated with increased abdominal distention and lower extremity edema.  On the day of admission his symptoms were acutely worse, prompting him to come to the hospital.  On his initial physical examination blood pressure 115/88, heart rate 118, respiratory rate 16, temperature 98.2, oxygen saturation 97%.  His lungs were clear to auscultation bilaterally, no wheezing or rales, heart S1-S2 present, tachycardic, irregularly irregular, no gallops or murmurs, abdomen soft nontender, positive lower extremity edema more right than left.  Sodium 143, potassium 3.8, chloride 106, bicarb 28, glucose 110, BUN 19, creatinine 0.9, magnesium 1.9, BNP 539, white count 8.8, hemoglobin 14.1, hematocrit 45.2, platelets 165.  INR 2.1, troponin I was negative  x3, urine analysis negative for infection.  Chest x-ray with cardiomegaly, increased vascular congestion.  EKG with atrial fibrillation, left anterior fascicular block, right bundle branch block, left axis deviation, positive PVCs.  Lower extremity Doppler ultrasonography negative for deep vein thrombosis.  Patient was admitted to the hospital with the working diagnosis of atrial fibrillation with rapid ventricle response, complicated by acute on chronic diastolic heart failure exacerbation.  1.  Atrial fibrillation with rapid ventricular response, complicated by acute on chronic systolic heart failure decompensation.  Patient was admitted to the medical ward, he was placed on a remote telemetry monitor, received AV blockade with verapamil with improvement of his heart rate, then he was continued on beta-blockade with metoprolol.  He was aggressively diuresed with IV furosemide, negative fluid balance was achieved, (-12,414 ml) since admission, with significant improvement of his symptoms.  Further work-up with echocardiography showed a significant reduction on his LV systolic function down to 25% with diffuse hypokinesis.  This reduction in LV systolic function was assumed to be related to uncontrolled atrial fibrillation.  His discharge heart rate is 79 bpm, on 100 mg of metoprolol twice daily and digoxin.  For his heart failure he was started on sacubitril/valsartan.  Continue anticoagulation with warfarin, (holding direct oral anticoagulants due to drug drug interaction with antiseizure medications). INR 2.3.  Patient may benefit from cardioversion in 3 weeks.  2.  Hypertension.  His discharge systolic blood pressure is 104 mmHg, he will continue taking Entresto, metoprolol, with a close follow-up as an outpatient.  3.  Right upper extremity thrombophlebitis, with mild cellulitis.  Related to IV access, continue 5 days of cephalexin.  4.  Seizure disorder.  Stable,  continue phenobarbital.  5.  Morbid  obesity with obstructive sleep apnea.  Calculated BMI 41.4.  Follow-up as an outpatient.  6.  BPH with urinary retention.  Patient will continue Flomax, will assure patient can void before discharge.  Discharge Diagnoses:  Active Problems:   Essential hypertension   Hyperlipidemia   Seizures (HCC)   Atrial fibrillation with RVR (HCC)   Acute on chronic combined systolic and diastolic CHF (congestive heart failure) (HCC)   CHF exacerbation (HCC)   Prolonged QT interval    Discharge Instructions  Discharge Instructions    AMB Referral to [facility] [facility]   Complete by:  As directed    Patientis a 72 year old male with history of Atrial fibrillation on Coumadin, OSA- obstructive sleep apnea, systolic congestive HF with an EF of 40 to 45% and seizure disorder.  He was noticed on admission with bilateral lower extremity swelling, increased abdominal distention and was found to have fluid overload, and be in A- fib with RVR- rapid ventricular response.    Patientmentionedhavinga weighing scaleat home and but admits not checking/ recording his weight daily.He and his wife eat out more than they eat in, at a family restaurant. He does not limit fluid intake.  Patienthas lack of awareness ofthesigns and symptomsof HF that will need medical assistance. He states not being aware of reportable weight gains,HF zones/ tool and ways to manage heart failure, medicationsand diet restrictions. Noted HF packet provided by RN and Low Sodium Nutrition Therapy handout at the bedside. Patient states that he has been depending on wife to help him manage health issues at home.   Explained to patient regarding[facility] care management servicesandresourcesavailable forhimand had voicedinterest about it. He states that him and wife will need to be further educated together since wife is mainly managing his health needs.  Patient hadverbally agreed and opted for [facility] care  coordinator (prefers home visits rather than phone calls) for assessment of home needs, further knowledge/ information onways to managehis health conditions (HF, A-fib, HTN)and reinforce adherence to medications/ treatment interventions after discharge.    Reason for consult:  Referral to [place] community care coordinator forfurther assessment ofhomeneeds, reinforcement on adherence to medical treatment interventions and provide ways tofurther manage HF and other health conditions after discharge.   Diagnoses of:   Heart Failure Other     Other Diagnosis:  A-fib, HTN   Expected date of contact:  1-3 days (reserved for hospital discharges)     Allergies as of 11/04/2017      Reactions   Dilaudid [hydromorphone Hcl] Other (See Comments)   Reaction:  Makes pt hyper    Morphine And Related Other (See Comments)   Pt states that this medication makes him feel like he is out of it.     Tegretol [carbamazepine] Hives      Medication List    STOP taking these medications   amLODipine 5 MG tablet Commonly known as:  NORVASC   bisoprolol 5 MG tablet Commonly known as:  ZEBETA   docusate sodium 100 MG capsule Commonly known as:  COLACE   losartan-hydrochlorothiazide 100-12.5 MG tablet Commonly known as:  HYZAAR   pantoprazole 40 MG tablet Commonly known as:  PROTONIX     TAKE these medications   acetaminophen 500 MG tablet Commonly known as:  TYLENOL Take 1,000 mg by mouth every 6 (six) hours as needed (Pain).   cephALEXin 500 MG capsule Commonly known as:  KEFLEX Take 1 capsule (500 mg  total) by mouth every 8 (eight) hours for 5 days.   digoxin 0.25 MG tablet Commonly known as:  LANOXIN Take 1 tablet (0.25 mg total) by mouth daily. Start taking on:  11/05/2017   famotidine 20 MG tablet Commonly known as:  PEPCID One at bedtime What changed:    how much to take  how to take this  when to take this   fluticasone 50 MCG/ACT nasal spray Commonly known as:   FLONASE Place 2 sprays into both nostrils 2 (two) times daily.   furosemide 40 MG tablet Commonly known as:  LASIX Take 1 tablet (40 mg total) by mouth daily. Start taking on:  11/05/2017   metoprolol tartrate 100 MG tablet Commonly known as:  LOPRESSOR Take 1 tablet (100 mg total) by mouth 2 (two) times daily.   PHENobarbital 97.2 MG tablet Commonly known as:  LUMINAL Take 97.2 mg by mouth every morning.   polyethylene glycol packet Commonly known as:  MIRALAX / GLYCOLAX Take 17 g by mouth daily as needed.   potassium chloride SA 20 MEQ tablet Commonly known as:  K-DUR,KLOR-CON Take 1 tablet (20 mEq total) by mouth daily.   pravastatin 40 MG tablet Commonly known as:  PRAVACHOL Take 40 mg by mouth daily.   sacubitril-valsartan 24-26 MG Commonly known as:  ENTRESTO Take 1 tablet by mouth 2 (two) times daily.   tamsulosin 0.4 MG Caps capsule Commonly known as:  FLOMAX Take 1 capsule (0.4 mg total) by mouth daily after supper.   warfarin 4 MG tablet Commonly known as:  COUMADIN Take 4 mg by mouth See admin instructions. Mon/Tues/Thurs/Fri/Sat   warfarin 5 MG tablet Commonly known as:  COUMADIN Take 5 mg by mouth 2 (two) times a week. Sunday and Wednesday      Follow-up Information    [facility] Follow up.   Specialty:  Home Health Services Why:  They will do your home health care at your home Contact information: [address] [place] [address] [phone]          Allergies  Allergen Reactions  . Dilaudid [Hydromorphone Hcl] Other (See Comments)    Reaction:  Makes pt hyper   . Morphine And Related Other (See Comments)    Pt states that this medication makes him feel like he is out of it.    . Tegretol [Carbamazepine] Hives    Consultations:  Cardiology    Procedures/Studies: Dg Chest 2 View  Result Date: 10/29/2017 CLINICAL DATA:  RIGHT leg pain and swelling, shortness of breath and atrial fibrillation. EXAM: CHEST - 2  VIEW COMPARISON:  07/22/2016. FINDINGS: Cardiomegaly. No consolidation or edema. Mild scarring LEFT base. No effusion or pneumothorax. Bones unremarkable. IMPRESSION: Cardiomegaly increased from priors.  No active disease. Electronically Signed   By: Staci Righter M.D.   On: 10/29/2017 18:49       Subjective: Patient feeling better, dyspnea has improved, no nausea or vomiting, no chest pain. Persistent right lower extremity edema.   Discharge Exam: Vitals:   11/03/17 2104 11/04/17 0550  BP: 109/64 93/67  Pulse: 93 60  Resp: 18 18  Temp: (!) 97.5 F (36.4 C) 97.8 F (36.6 C)  SpO2: 92% 93%   Vitals:   11/03/17 0517 11/03/17 1221 11/03/17 2104 11/04/17 0550  BP:  110/85 109/64 93/67  Pulse:  (!) 56 93 60  Resp:  _0 Temp:  98.1 F (36.7 C) (!) 97.5 F (36.4 C) 97.8 F (  36.6 C)  TempSrc:  Oral Oral Oral  SpO2:  92% 92% 93%  Weight: 124 kg   123.7 kg  Height:        General: Not in pain or dyspnea./  Neurology: Awake and alert, non focal  E ENT: no pallor, no icterus, oral mucosa moist Cardiovascular: No JVD. S1-S2 present, rhythmic, no gallops, rubs, or murmurs. Trace non pitting lower extremity edema on the right. Pulmonary: positive breath sounds bilaterally, no wheezing, rhonchi or rales. Gastrointestinal. Abdomen portuberant no organomegaly, non tender, no rebound or guarding Skin. No rashes Musculoskeletal: right ankle deformities   The results of significant diagnostics from this hospitalization (including imaging, microbiology, ancillary and laboratory) are listed below for reference.     Microbiology: No results found for this or any previous visit (from the past 240 hour(s)).   Labs: BNP (last 3 results) Recent Labs    10/29/17 1840  BNP 978.0*   Basic Metabolic Panel: Recent Labs  Lab 10/29/17 1840 10/30/17 0552 10/31/17 0514 11/01/17 0428 11/02/17 0425 11/03/17 0545 11/04/17 0653  NA 143 143 142 139 140 139 138  K 3.8 3.4* 4.2 3.9 3.6  3.7 4.0  CL 106 105 106 101 99 99 98  CO2 _0 32 30 28  GLUCOSE 110* 103* 101* 103* 107* 117* 109*  BUN _1 24*  CREATININE 0.94 0.97 0.94 1.00 1.03 1.08 1.11  CALCIUM 8.8* 8.4* 8.5* 8.5* 8.8* 8.8* 8.5*  MG 1.9 1.8  --  1.8 1.9  --   --   PHOS  --  3.7  --   --   --   --   --    Liver Function Tests: Recent Labs  Lab 10/30/17 0552  AST 20  ALT 19  ALKPHOS 96  BILITOT 1.0  PROT 5.9*  ALBUMIN 3.4*   No results for input(s): LIPASE, AMYLASE in the last 168 hours. No results for input(s): AMMONIA in the last 168 hours. CBC: Recent Labs  Lab 10/29/17 1840 10/30/17 0552 10/31/17 0514 11/02/17 0425  WBC 8.8 8.6 8.7 11.2*  NEUTROABS  --  5.1  --   --   HGB 14.1 13.1 13.6 14.7  HCT 45.2 41.4 42.8 45.7  MCV 94.6 92.8 93.0 91.4  PLT 165 149* 161 175   Cardiac Enzymes: Recent Labs  Lab 10/30/17 0023 10/30/17 0552 10/30/17 1152  TROPONINI <0.03 <0.03 <0.03   BNP: Invalid input(s): POCBNP CBG: No results for input(s): GLUCAP in the last 168 hours. D-Dimer No results for input(s): DDIMER in the last 72 hours. Hgb A1c No results for input(s): HGBA1C in the last 72 hours. Lipid Profile Recent Labs    11/02/17 0425  CHOL 130  HDL 31*  LDLCALC 83  TRIG 81  CHOLHDL 4.2   Thyroid function studies No results for input(s): TSH, T4TOTAL, T3FREE, THYROIDAB in the last 72 hours.  Invalid input(s): FREET3 Anemia work up No results for input(s): VITAMINB12, FOLATE, FERRITIN, TIBC, IRON, RETICCTPCT in the last 72 hours. Urinalysis    Component Value Date/Time   COLORURINE STRAW (A) 11/01/2017 1934   APPEARANCEUR CLEAR 11/01/2017 1934   LABSPEC 1.005 11/01/2017 1934   PHURINE 7.0 11/01/2017 1934   GLUCOSEU NEGATIVE 11/01/2017 1934   HGBUR NEGATIVE 11/01/2017 1934   BILIRUBINUR NEGATIVE 11/01/2017 1934   KETONESUR NEGATIVE 11/01/2017 1934   PROTEINUR NEGATIVE 11/01/2017 1934   UROBILINOGEN 0.2 05/06/2010 1010   NITRITE NEGATIVE 11/01/2017 1934    LEUKOCYTESUR NEGATIVE  11/01/2017 1934   Sepsis Labs Invalid input(s): PROCALCITONIN,  WBC,  LACTICIDVEN Microbiology No results found for this or any previous visit (from the past 240 hour(s)).   Time coordinating discharge: 45 minutes  SIGNED:   Tawni Millers, MD  [facility] 11/04/2017, 10:52 AM Pager [phone]  If 7PM-7AM, please contact night-coverage [email or web address] Password [email or web address]

## 2017-11-04 NOTE — Progress Notes (Signed)
Patient was bladder scanned 26m total.

## 2017-11-04 NOTE — Progress Notes (Signed)
Pt experienced acute urinary  retention over night 900cc in out. Currently no discomfort.

## 2017-11-06 ENCOUNTER — Other Ambulatory Visit: Payer: Self-pay | Admitting: *Deleted

## 2017-11-06 NOTE — Patient Outreach (Signed)
[place] [facility] [facility]) Care Management  11/06/2017  Ruben Murray 08-31-1945 [id number]   Referral received from [place] as member was recently admitted to the hospital with a-fib and heart failure.  Primary MD office will complete transition of care assessment.  Per chart, he also has history of hypertension, COPD, and hyperlipidemia.  Call placed to member, consent received to speak with wife.  She report he is progressing well, continues to have some shortness of breath.  State she help with managing medications and appointments, report compliance with medications, notes multiple changes after discharge.  Appointment scheduled with cardiologist on 9/17, will call to schedule appointment with primary MD.  Denies any urgent concerns, home visit scheduled for next week.  [facility] CM Care Plan Problem One     Most Recent Value  Care Plan Problem One  Risk for readmission related to A fib/heart failure as evidenced by recent hospitalization  Role Documenting the Problem One  Care Management [place] for Problem One  Active  [facility] Long Term Goal   Member will not be readmitted to hospital within the next 31 days  [facility] Long Term Goal Start Date  11/06/17  Interventions for Problem One Long Term Goal  Discussed with member the importance of following discharge instructions, including follow up appointments, medications, diet, and possible home health involvement, to decrease the risk of readmission  [facility] CM Short Term Goal #1   Member will have follow up appointment with cardiologist within the next 2 weeks  [facility] CM Short Term Goal #1 Start Date  11/06/17  Interventions for Short Term Goal #1  Reviewed upcoming appointments with member and wife, confirmed he will have transportation  [facility] CM Short Term Goal #2   Member will take and record weights daily within the next 4 weeks  [facility] CM Short Term Goal #2 Start Date  11/06/17  Interventions for Short Term Goal #2  Heart  failure zones reviewed as well as when to contact MD.  Importance of daily weights discussed.     [name], [place], MSN [place] [phone]

## 2017-11-07 ENCOUNTER — Telehealth: Payer: Self-pay | Admitting: Cardiology

## 2017-11-07 DIAGNOSIS — I4891 Unspecified atrial fibrillation: Secondary | ICD-10-CM | POA: Diagnosis not present

## 2017-11-07 DIAGNOSIS — Z23 Encounter for immunization: Secondary | ICD-10-CM | POA: Diagnosis not present

## 2017-11-07 DIAGNOSIS — Z7901 Long term (current) use of anticoagulants: Secondary | ICD-10-CM | POA: Diagnosis not present

## 2017-11-07 NOTE — Telephone Encounter (Signed)
New Message    [facility] appointment schedule with Cecilie Kicks 11/20/17.

## 2017-11-08 ENCOUNTER — Ambulatory Visit: Payer: Medicare HMO | Admitting: *Deleted

## 2017-11-08 DIAGNOSIS — I420 Dilated cardiomyopathy: Secondary | ICD-10-CM | POA: Diagnosis not present

## 2017-11-08 DIAGNOSIS — J4540 Moderate persistent asthma, uncomplicated: Secondary | ICD-10-CM | POA: Diagnosis not present

## 2017-11-08 DIAGNOSIS — I110 Hypertensive heart disease with heart failure: Secondary | ICD-10-CM | POA: Diagnosis not present

## 2017-11-08 DIAGNOSIS — I2510 Atherosclerotic heart disease of native coronary artery without angina pectoris: Secondary | ICD-10-CM | POA: Diagnosis not present

## 2017-11-08 DIAGNOSIS — I808 Phlebitis and thrombophlebitis of other sites: Secondary | ICD-10-CM | POA: Diagnosis not present

## 2017-11-08 DIAGNOSIS — J449 Chronic obstructive pulmonary disease, unspecified: Secondary | ICD-10-CM | POA: Diagnosis not present

## 2017-11-08 DIAGNOSIS — I480 Paroxysmal atrial fibrillation: Secondary | ICD-10-CM | POA: Diagnosis not present

## 2017-11-08 DIAGNOSIS — I5043 Acute on chronic combined systolic (congestive) and diastolic (congestive) heart failure: Secondary | ICD-10-CM | POA: Diagnosis not present

## 2017-11-08 DIAGNOSIS — L03113 Cellulitis of right upper limb: Secondary | ICD-10-CM | POA: Diagnosis not present

## 2017-11-09 DIAGNOSIS — I808 Phlebitis and thrombophlebitis of other sites: Secondary | ICD-10-CM | POA: Diagnosis not present

## 2017-11-09 DIAGNOSIS — I110 Hypertensive heart disease with heart failure: Secondary | ICD-10-CM | POA: Diagnosis not present

## 2017-11-09 DIAGNOSIS — J4540 Moderate persistent asthma, uncomplicated: Secondary | ICD-10-CM | POA: Diagnosis not present

## 2017-11-09 DIAGNOSIS — I420 Dilated cardiomyopathy: Secondary | ICD-10-CM | POA: Diagnosis not present

## 2017-11-09 DIAGNOSIS — I5043 Acute on chronic combined systolic (congestive) and diastolic (congestive) heart failure: Secondary | ICD-10-CM | POA: Diagnosis not present

## 2017-11-09 DIAGNOSIS — J449 Chronic obstructive pulmonary disease, unspecified: Secondary | ICD-10-CM | POA: Diagnosis not present

## 2017-11-09 DIAGNOSIS — L03113 Cellulitis of right upper limb: Secondary | ICD-10-CM | POA: Diagnosis not present

## 2017-11-09 DIAGNOSIS — I2510 Atherosclerotic heart disease of native coronary artery without angina pectoris: Secondary | ICD-10-CM | POA: Diagnosis not present

## 2017-11-09 DIAGNOSIS — I480 Paroxysmal atrial fibrillation: Secondary | ICD-10-CM | POA: Diagnosis not present

## 2017-11-13 DIAGNOSIS — I809 Phlebitis and thrombophlebitis of unspecified site: Secondary | ICD-10-CM | POA: Diagnosis not present

## 2017-11-13 DIAGNOSIS — I480 Paroxysmal atrial fibrillation: Secondary | ICD-10-CM | POA: Diagnosis not present

## 2017-11-13 DIAGNOSIS — I5023 Acute on chronic systolic (congestive) heart failure: Secondary | ICD-10-CM | POA: Diagnosis not present

## 2017-11-13 DIAGNOSIS — I10 Essential (primary) hypertension: Secondary | ICD-10-CM | POA: Diagnosis not present

## 2017-11-13 NOTE — Telephone Encounter (Signed)
Patient contacted regarding discharge from [facility] on November 04, 2017.  Patient understands to follow up with provider Cecilie Kicks, NP on November 20, 2017 at 2:30pm at [facility]. Patient understands discharge instructions? yes Patient understands medications and regiment? yes Patient understands to bring all medications to this visit? yes

## 2017-11-14 DIAGNOSIS — I420 Dilated cardiomyopathy: Secondary | ICD-10-CM | POA: Diagnosis not present

## 2017-11-14 DIAGNOSIS — J449 Chronic obstructive pulmonary disease, unspecified: Secondary | ICD-10-CM | POA: Diagnosis not present

## 2017-11-14 DIAGNOSIS — J4540 Moderate persistent asthma, uncomplicated: Secondary | ICD-10-CM | POA: Diagnosis not present

## 2017-11-14 DIAGNOSIS — L03113 Cellulitis of right upper limb: Secondary | ICD-10-CM | POA: Diagnosis not present

## 2017-11-14 DIAGNOSIS — I110 Hypertensive heart disease with heart failure: Secondary | ICD-10-CM | POA: Diagnosis not present

## 2017-11-14 DIAGNOSIS — I808 Phlebitis and thrombophlebitis of other sites: Secondary | ICD-10-CM | POA: Diagnosis not present

## 2017-11-14 DIAGNOSIS — I2510 Atherosclerotic heart disease of native coronary artery without angina pectoris: Secondary | ICD-10-CM | POA: Diagnosis not present

## 2017-11-14 DIAGNOSIS — I5043 Acute on chronic combined systolic (congestive) and diastolic (congestive) heart failure: Secondary | ICD-10-CM | POA: Diagnosis not present

## 2017-11-14 DIAGNOSIS — I480 Paroxysmal atrial fibrillation: Secondary | ICD-10-CM | POA: Diagnosis not present

## 2017-11-15 ENCOUNTER — Other Ambulatory Visit: Payer: Self-pay | Admitting: *Deleted

## 2017-11-15 ENCOUNTER — Encounter: Payer: Self-pay | Admitting: *Deleted

## 2017-11-15 NOTE — Patient Outreach (Signed)
Ruben Murray) Care Management   11/15/2017  Ruben Murray 10-Dec-1945 [id number]  Ruben Murray is an 72 y.o. male  Subjective:   Member alert and oriented x3, denies complaints of pain or discomfort.  Denies shortness of breath, but does report he is tired, but state he had a busy day yesterday with PT.  He is also still active with his church.  Objective:   Review of Systems  Constitutional: Negative.   HENT: Negative.   Eyes: Negative.   Respiratory: Negative.   Cardiovascular: Negative.   Gastrointestinal: Negative.   Genitourinary: Negative.   Musculoskeletal: Negative.   Skin: Negative.   Neurological: Negative.   Endo/Heme/Allergies: Negative.   Psychiatric/Behavioral: Negative.     Physical Exam  Constitutional: He is oriented to person, place, and time. He appears well-developed and well-nourished.  Neck: Normal range of motion.  Cardiovascular:  A-fib  Respiratory: Effort normal and breath sounds normal.  GI: Soft. Bowel sounds are normal.  Musculoskeletal: Normal range of motion.  Neurological: He is alert and oriented to person, place, and time.  Skin: Skin is warm and dry.   BP 118/72 (BP Location: Left Arm, Patient Position: Sitting, Cuff Size: Normal)   Pulse 70   Resp 18   Ht 1.727 m (_0 )   Wt 270 lb (122.5 kg)   SpO2 97%   BMI 41.05 kg/m   Encounter Medications:   Outpatient Encounter Medications as of 11/15/2017  Medication Sig  . acetaminophen (TYLENOL) 500 MG tablet Take 1,000 mg by mouth every 6 (six) hours as needed (Pain).  Marland Kitchen digoxin (LANOXIN) 0.25 MG tablet Take 1 tablet (0.25 mg total) by mouth daily.  . famotidine (PEPCID) 20 MG tablet One at bedtime (Patient taking differently: Take 20 mg by mouth at bedtime. One at bedtime)  . fluticasone (FLONASE) 50 MCG/ACT nasal spray Place 2 sprays into both nostrils 2 (two) times daily.  . furosemide (LASIX) 40 MG tablet Take 1 tablet (40 mg total) by mouth daily.  .  metoprolol tartrate (LOPRESSOR) 100 MG tablet Take 1 tablet (100 mg total) by mouth 2 (two) times daily.  Marland Kitchen PHENobarbital (LUMINAL) 97.2 MG tablet Take 97.2 mg by mouth every morning.  . polyethylene glycol (MIRALAX / GLYCOLAX) packet Take 17 g by mouth daily as needed.  . potassium chloride SA (K-DUR,KLOR-CON) 20 MEQ tablet Take 1 tablet (20 mEq total) by mouth daily.  . pravastatin (PRAVACHOL) 40 MG tablet Take 40 mg by mouth daily.  . sacubitril-valsartan (ENTRESTO) 24-26 MG Take 1 tablet by mouth 2 (two) times daily.  . tamsulosin (FLOMAX) 0.4 MG CAPS capsule Take 1 capsule (0.4 mg total) by mouth daily after supper.  . warfarin (COUMADIN) 4 MG tablet Take 4 mg by mouth See admin instructions. Mon/Tues/Thurs/Fri/Sat  . warfarin (COUMADIN) 5 MG tablet Take 5 mg by mouth 2 (two) times a week. Sunday and Wednesday   No facility-administered encounter medications on file as of 11/15/2017.     Functional Status:   In your present state of health, do you have any difficulty performing the following activities: 11/15/2017 10/29/2017  Hearing? N N  Vision? N N  Difficulty concentrating or making decisions? N N  Walking or climbing stairs? Y Y  Dressing or bathing? N N  Doing errands, shopping? [name]  Preparing Food and eating ? N -  Using the Toilet? N -  In the past six months, have you accidently leaked urine? Y -  Do you have  problems with loss of bowel control? N -  Managing your Medications? Y -  Managing your Finances? Y -  Housekeeping or managing your Housekeeping? Y -  Some recent data might be hidden    Fall/Depression Screening:    Fall Risk  11/15/2017  Falls in the past year? No   PHQ 2/9 Scores 11/15/2017  PHQ - 2 Score 0    Assessment:    Met with member at scheduled time, assessment complete, no distress noted.  Wife is very active in member's care.  She provide transportation for all appointments and meals.  She is starting to monitor salt intake more, learning to read  food labels.  They are also being more mindful of their food choices when eating out, usually eat at restaurants that provide home cooked meals.  Provided with this care manager's contact information, advised to contact with questions/concerns.  Plan:   Will follow up within the next month.  [facility] CM Care Plan Problem One     Most Recent Value  Care Plan Problem One  Risk for readmission related to A fib/heart failure as evidenced by recent hospitalization  Role Documenting the Problem One  Care Management [place] for Problem One  Active  THN Long Term Goal   Member will not be readmitted to hospital within the next 31 days  THN Long Term Goal Start Date  11/06/17  Interventions for Problem One Long Term Goal  Educated member on "know before you go" education form as well as 24 hour nurse triage line  [facility] CM Short Term Goal #1   Member will have follow up appointment with cardiologist within the next 2 weeks  [facility] CM Short Term Goal #1 Start Date  11/06/17  Interventions for Short Term Goal #1  Educated on importance of close follow up with cardiologist to manage heart failure  [facility] CM Short Term Goal #2   Member will take and record weights daily within the next 4 weeks  [facility] CM Short Term Goal #2 Start Date  11/06/17  Interventions for Short Term Goal #2  Provided with living better with heart failure booklet and logs for daily weight monitoring.     Ruben Murray, [place], MSN [place] [phone]

## 2017-11-16 DIAGNOSIS — I480 Paroxysmal atrial fibrillation: Secondary | ICD-10-CM | POA: Diagnosis not present

## 2017-11-16 DIAGNOSIS — L03113 Cellulitis of right upper limb: Secondary | ICD-10-CM | POA: Diagnosis not present

## 2017-11-16 DIAGNOSIS — I808 Phlebitis and thrombophlebitis of other sites: Secondary | ICD-10-CM | POA: Diagnosis not present

## 2017-11-16 DIAGNOSIS — I2510 Atherosclerotic heart disease of native coronary artery without angina pectoris: Secondary | ICD-10-CM | POA: Diagnosis not present

## 2017-11-16 DIAGNOSIS — J449 Chronic obstructive pulmonary disease, unspecified: Secondary | ICD-10-CM | POA: Diagnosis not present

## 2017-11-16 DIAGNOSIS — I5043 Acute on chronic combined systolic (congestive) and diastolic (congestive) heart failure: Secondary | ICD-10-CM | POA: Diagnosis not present

## 2017-11-16 DIAGNOSIS — I420 Dilated cardiomyopathy: Secondary | ICD-10-CM | POA: Diagnosis not present

## 2017-11-16 DIAGNOSIS — I110 Hypertensive heart disease with heart failure: Secondary | ICD-10-CM | POA: Diagnosis not present

## 2017-11-16 DIAGNOSIS — J4540 Moderate persistent asthma, uncomplicated: Secondary | ICD-10-CM | POA: Diagnosis not present

## 2017-11-19 DIAGNOSIS — I2510 Atherosclerotic heart disease of native coronary artery without angina pectoris: Secondary | ICD-10-CM | POA: Diagnosis not present

## 2017-11-19 DIAGNOSIS — J449 Chronic obstructive pulmonary disease, unspecified: Secondary | ICD-10-CM | POA: Diagnosis not present

## 2017-11-19 DIAGNOSIS — L03113 Cellulitis of right upper limb: Secondary | ICD-10-CM | POA: Diagnosis not present

## 2017-11-19 DIAGNOSIS — I5043 Acute on chronic combined systolic (congestive) and diastolic (congestive) heart failure: Secondary | ICD-10-CM | POA: Diagnosis not present

## 2017-11-19 DIAGNOSIS — I110 Hypertensive heart disease with heart failure: Secondary | ICD-10-CM | POA: Diagnosis not present

## 2017-11-19 DIAGNOSIS — I480 Paroxysmal atrial fibrillation: Secondary | ICD-10-CM | POA: Diagnosis not present

## 2017-11-19 DIAGNOSIS — I808 Phlebitis and thrombophlebitis of other sites: Secondary | ICD-10-CM | POA: Diagnosis not present

## 2017-11-19 DIAGNOSIS — J4540 Moderate persistent asthma, uncomplicated: Secondary | ICD-10-CM | POA: Diagnosis not present

## 2017-11-19 DIAGNOSIS — I420 Dilated cardiomyopathy: Secondary | ICD-10-CM | POA: Diagnosis not present

## 2017-11-20 ENCOUNTER — Encounter: Payer: Self-pay | Admitting: Cardiology

## 2017-11-20 ENCOUNTER — Ambulatory Visit (INDEPENDENT_AMBULATORY_CARE_PROVIDER_SITE_OTHER): Payer: Medicare HMO | Admitting: Cardiology

## 2017-11-20 VITALS — BP 116/66 | HR 77 | Ht 68.0 in | Wt 274.0 lb

## 2017-11-20 DIAGNOSIS — I2510 Atherosclerotic heart disease of native coronary artery without angina pectoris: Secondary | ICD-10-CM | POA: Diagnosis not present

## 2017-11-20 DIAGNOSIS — I420 Dilated cardiomyopathy: Secondary | ICD-10-CM

## 2017-11-20 DIAGNOSIS — I480 Paroxysmal atrial fibrillation: Secondary | ICD-10-CM

## 2017-11-20 DIAGNOSIS — I10 Essential (primary) hypertension: Secondary | ICD-10-CM

## 2017-11-20 NOTE — Patient Instructions (Addendum)
Medication Instructions: Stop Digoxin morning of the Cardioversion on December 13, 2017    Labwork: Digoxin level today and BMET     Testing/Procedures: Your physician has recommended that you have a Cardioversion (DCCV). Electrical Cardioversion uses a jolt of electricity to your heart either through paddles or wired patches attached to your chest. This is a controlled, usually prescheduled, procedure. Defibrillation is done under light anesthesia in the hospital, and you usually go home the day of the procedure. This is done to get your heart back into a normal rhythm. You are not awake for the procedure. Please see the instruction sheet given to you today.  December 13, 2017  Arrive at Short Stay at [facility] at 11:30am fasting after midnight    Follow-Up: 2 weeks after 12/13/17 cardioversion with APP   Any Other Special Instructions Will Be Listed Below (If Applicable).     If you need a refill on your cardiac medications before your next appointment, please call your pharmacy.

## 2017-11-20 NOTE — Progress Notes (Addendum)
Cardiology Office Note   Date:  11/21/2017   ID:  [name], DOB [birth year], MRN [id number]  PCP:  [name], MD  Cardiologist:  Dr. [name]     Chief Complaint  Patient presents with  . Congestive Heart Failure    atrial fib   TOC    History of Present Illness: Ruben Murray is a 72 y.o. male who presents for post hospitalization with acute on chronic diastolic/systolic HF.    He has a histroy of systolic/diastolic CHF, PAF, HTN, HL , RBBB, seizures, PAD, morbid obesity whow as admitted with SOB and recurrent rapid atrial fib; not on DOAC secondary to interaction with phenobarbital rate control treated with increased beta-blockers and digoxin.  Entresto added 2/18 EF 40-45% 8/27--echo EF 25%, LAD-moderate  CHADSVASC is 4 for CHF, HTN, age, vascular disease (aortic altherosclerosis). He is maintained on Coumadin as above  Plan is for DCCV in 3 weeks.  D/c 11/04/17 INR on 11/02/17 was 2.33 need outpt INR.  No follow up INR.    Today he has no complaints, no SOB and no chest pain.   With PT he has some dyspnea with exertion, due to deconditioning.   Other wise he is feeling well.  No bleeding. He has need for boot on rt foot.  No awareness or a fib.       Past Medical History:  Diagnosis Date  . Aortic atherosclerosis ([place])   . CAP (community acquired pneumonia) 03/06/2016  . Cardiomyopathy ([place])    a. EF 40-45% in 04/2016, etiology not defined, managed medically.  . Chronic combined systolic and diastolic CHF (congestive heart failure) ([place])   . Hyperlipidemia   . Hypertension   . Morbid obesity ([place])   . Myocardial infarction [facility]) 1980-early 2000s X 2   "mild ones" (03/06/2016)  . OSA on CPAP    a. pt states was told by Dr. Maxwell Caul that he would not require CPAP as long as he continued to sleep in recliner which he does for his back.  . Paroxysmal atrial fibrillation ([place])   . Pneumonia    "when I was a kid" (03/06/2016)  . RBBB   . Seizures ([place])    "take RX  daily" (03/06/2016)    Past Surgical History:  Procedure Laterality Date  . CIRCUMCISION    . JOINT REPLACEMENT    . PERCUTANEOUS PINNING TOE FRACTURE Left    "big toe  . REPLACEMENT TOTAL KNEE Left   . TONSILLECTOMY AND ADENOIDECTOMY       Current Outpatient Medications  Medication Sig Dispense Refill  . acetaminophen (TYLENOL) 500 MG tablet Take 1,000 mg by mouth every 6 (six) hours as needed (Pain).    Marland Kitchen digoxin (LANOXIN) 0.25 MG tablet Take 1 tablet (0.25 mg total) by mouth daily. 30 tablet 0  . famotidine (PEPCID) 20 MG tablet One at bedtime (Patient taking differently: Take 20 mg by mouth at bedtime. One at bedtime) 30 tablet 2  . fluticasone (FLONASE) 50 MCG/ACT nasal spray Place 2 sprays into both nostrils 2 (two) times daily.    . furosemide (LASIX) 40 MG tablet Take 1 tablet (40 mg total) by mouth daily. 30 tablet 0  . metoprolol tartrate (LOPRESSOR) 100 MG tablet Take 1 tablet (100 mg total) by mouth 2 (two) times daily. 60 tablet 0  . PHENobarbital (LUMINAL) 97.2 MG tablet Take 97.2 mg by mouth every morning.    . polyethylene glycol (MIRALAX / GLYCOLAX) packet Take 17 g by  mouth daily as needed. 14 each 0  . potassium chloride SA (K-DUR,KLOR-CON) 20 MEQ tablet Take 1 tablet (20 mEq total) by mouth daily. 30 tablet 0  . pravastatin (PRAVACHOL) 40 MG tablet Take 40 mg by mouth daily.    . sacubitril-valsartan (ENTRESTO) 24-26 MG Take 1 tablet by mouth 2 (two) times daily. 60 tablet 0  . tamsulosin (FLOMAX) 0.4 MG CAPS capsule Take 1 capsule (0.4 mg total) by mouth daily after supper. 30 capsule 0  . warfarin (COUMADIN) 4 MG tablet Take 4 mg by mouth See admin instructions. Mon/Tues/Thurs/Fri/Sat    . warfarin (COUMADIN) 5 MG tablet Take 5 mg by mouth 2 (two) times a week. Sunday and Wednesday     No current facility-administered medications for this visit.     Allergies:   Dilaudid [hydromorphone hcl]; Morphine and related; and Tegretol [carbamazepine]    Social History:   The patient  reports that he quit smoking about 19 years ago. His smoking use included cigarettes. He has a 144.00 pack-year smoking history. He has never used smokeless tobacco. He reports that he does not drink alcohol or use drugs.   Family History:  The patient's family history includes Hypertension in his mother; Other in his father.    ROS:  General:no colds or fevers, no weight changes Skin:no rashes or ulcers HEENT:no blurred vision, no congestion CV:see HPI PUL:see HPI GI:no diarrhea constipation or melena, no indigestion GU:no hematuria, no dysuria MS:no joint pain, no claudication Neuro:no syncope, no lightheadedness Endo:no diabetes, no thyroid disease  Wt Readings from Last 3 Encounters:  11/20/17 274 lb (124.3 kg)  11/15/17 270 lb (122.5 kg)  11/04/17 272 lb 9.6 oz (123.7 kg)     PHYSICAL EXAM: VS:  BP 116/66   Pulse 77   Ht _0  (1.727 m)   Wt 274 lb (124.3 kg)   SpO2 98%   BMI 41.66 kg/m  , BMI Body mass index is 41.66 kg/m. General:Pleasant affect, NAD Skin:Warm and dry, brisk capillary refill HEENT:normocephalic, sclera clear, mucus membranes moist Neck:supple, no JVD, no bruits  Heart:irreg irreg  without murmur, gallup, rub or click Lungs:clear without rales, rhonchi, or wheezes DXI:PJAS, non tender, + BS, do not palpate liver spleen or masses Ext:no lower ext edema, 2+ pedal pulses, 2+ radial pulses Neuro:alert and oriented, MAE, follows commands, + facial symmetry    EKG:  EKG is ordered today. The ekg ordered today demonstrates a fib , LAD, RBBB no acute changes   Recent Labs: 10/29/2017: B Natriuretic Peptide 539.2 10/30/2017: ALT 19; TSH 1.469 11/02/2017: Hemoglobin 14.7; Magnesium 1.9; Platelets 175 11/20/2017: BUN 15; Creatinine, Ser 1.02; Potassium 4.7; Sodium 147    Lipid Panel    Component Value Date/Time   CHOL 130 11/02/2017 0425   TRIG 81 11/02/2017 0425   HDL 31 (L) 11/02/2017 0425   CHOLHDL 4.2 11/02/2017 0425   VLDL 16  11/02/2017 0425   LDLCALC 83 11/02/2017 0425       Other studies Reviewed: Additional studies/ records that were reviewed today include: . Echo   8/27 Study Conclusions  - Left ventricle: The cavity size was mildly dilated. Wall thickness was normal. The estimated ejection fraction was 25%. Diffuse hypokinesis. The study was not technically sufficient to allow evaluation of LV diastolic dysfunction due to atrial fibrillation. - Aortic valve: There was no stenosis. There was trivial regurgitation. - Aorta: Mildly dilated aortic root. Aortic root dimension: 37 mm (ED). - Mitral valve: There was trivial regurgitation. -  Left atrium: The atrium was moderately dilated. - Right ventricle: The cavity size was normal. Systolic function was moderately reduced. - Right atrium: The atrium was mildly dilated. - Tricuspid valve: Peak RV-RA gradient (S): 31 mm Hg. - Pulmonary arteries: PA peak pressure: 34 mm Hg (S). - Inferior vena cava: The vessel was normal in size. The respirophasic diameter changes were in the normal range (>= 50%), consistent with normal central venous pressure. - Pericardium, extracardiac: A trivial pericardial effusion was identified.  Impressions:  - The patient was in atrial fibrillation. Mildly dilated LV with EF 25%, diffuse hypokinesis. Normal RV size with moderately decreased systolic function. No significant valvular abnormalities.    ASSESSMENT AND PLAN:  1.  Atrial fib, rate controled, with dig. And BB, will check dig level today.  BMP from PCP with Cr 0.9 K+ 4.2  Plan will be for DCCV in 3 weeks once he has 3 therapeutic INRs weekly.  Will plan DCCV now and if INR decreases will cancel.   Discussed at length with pt and family.  They are agreeable to proceed. Follow up post DCCv  2.. CM due to a fib and rapid rate now controlled.  On entresto but they do not believe they can afford.  I am holding off increasing dose  for now to allow more time on Entresto and if they cannot afford then will change to ARB.    3.    HTN controlled  4.   Chronic systolic and diastolic HF. euvolemic today   5.  CAD once cardioverted will discuss with Dr. [name] about stress test, hx of MI remotely no chest pain.    6.  Seizures per PCP    Current medicines are reviewed with the patient today.  The patient Has no concerns regarding medicines.  The following changes have been made:  See above Labs/ tests ordered today include:see above  Disposition:   FU:  see above  Signed, Cecilie Kicks, NP  11/21/2017 8:40 PM    [address], [place], [place] Phone: [phone]; Fax: [phone]

## 2017-11-20 NOTE — H&P (View-Only) (Signed)
Cardiology Office Note   Date:  11/21/2017   ID:  [name], DOB [birth year], MRN [id number]  PCP:  [name], MD  Cardiologist:  Dr. [name]     Chief Complaint  Patient presents with  . Congestive Heart Failure    atrial fib   TOC    History of Present Illness: Ruben Murray is a 72 y.o. male who presents for post hospitalization with acute on chronic diastolic/systolic HF.    He has a histroy of systolic/diastolic CHF, PAF, HTN, HL , RBBB, seizures, PAD, morbid obesity whow as admitted with SOB and recurrent rapid atrial fib; not on DOAC secondary to interaction with phenobarbital rate control treated with increased beta-blockers and digoxin.  Entresto added 2/18 EF 40-45% 8/27--echo EF 25%, LAD-moderate  CHADSVASC is 4 for CHF, HTN, age, vascular disease (aortic altherosclerosis). He is maintained on Coumadin as above  Plan is for DCCV in 3 weeks.  D/c 11/04/17 INR on 11/02/17 was 2.33 need outpt INR.  No follow up INR.    Today he has no complaints, no SOB and no chest pain.   With PT he has some dyspnea with exertion, due to deconditioning.   Other wise he is feeling well.  No bleeding. He has need for boot on rt foot.  No awareness or a fib.       Past Medical History:  Diagnosis Date  . Aortic atherosclerosis ([place])   . CAP (community acquired pneumonia) 03/06/2016  . Cardiomyopathy ([place])    a. EF 40-45% in 04/2016, etiology not defined, managed medically.  . Chronic combined systolic and diastolic CHF (congestive heart failure) ([place])   . Hyperlipidemia   . Hypertension   . Morbid obesity ([place])   . Myocardial infarction [facility]) 1980-early 2000s X 2   "mild ones" (03/06/2016)  . OSA on CPAP    a. pt states was told by Dr. Maxwell Caul that he would not require CPAP as long as he continued to sleep in recliner which he does for his back.  . Paroxysmal atrial fibrillation ([place])   . Pneumonia    "when I was a kid" (03/06/2016)  . RBBB   . Seizures ()    "take RX  daily" (03/06/2016)    Past Surgical History:  Procedure Laterality Date  . CIRCUMCISION    . JOINT REPLACEMENT    . PERCUTANEOUS PINNING TOE FRACTURE Left    "big toe  . REPLACEMENT TOTAL KNEE Left   . TONSILLECTOMY AND ADENOIDECTOMY       Current Outpatient Medications  Medication Sig Dispense Refill  . acetaminophen (TYLENOL) 500 MG tablet Take 1,000 mg by mouth every 6 (six) hours as needed (Pain).    Marland Kitchen digoxin (LANOXIN) 0.25 MG tablet Take 1 tablet (0.25 mg total) by mouth daily. 30 tablet 0  . famotidine (PEPCID) 20 MG tablet One at bedtime (Patient taking differently: Take 20 mg by mouth at bedtime. One at bedtime) 30 tablet 2  . fluticasone (FLONASE) 50 MCG/ACT nasal spray Place 2 sprays into both nostrils 2 (two) times daily.    . furosemide (LASIX) 40 MG tablet Take 1 tablet (40 mg total) by mouth daily. 30 tablet 0  . metoprolol tartrate (LOPRESSOR) 100 MG tablet Take 1 tablet (100 mg total) by mouth 2 (two) times daily. 60 tablet 0  . PHENobarbital (LUMINAL) 97.2 MG tablet Take 97.2 mg by mouth every morning.    . polyethylene glycol (MIRALAX / GLYCOLAX) packet Take 17 g by  mouth daily as needed. 14 each 0  . potassium chloride SA (K-DUR,KLOR-CON) 20 MEQ tablet Take 1 tablet (20 mEq total) by mouth daily. 30 tablet 0  . pravastatin (PRAVACHOL) 40 MG tablet Take 40 mg by mouth daily.    . sacubitril-valsartan (ENTRESTO) 24-26 MG Take 1 tablet by mouth 2 (two) times daily. 60 tablet 0  . tamsulosin (FLOMAX) 0.4 MG CAPS capsule Take 1 capsule (0.4 mg total) by mouth daily after supper. 30 capsule 0  . warfarin (COUMADIN) 4 MG tablet Take 4 mg by mouth See admin instructions. Mon/Tues/Thurs/Fri/Sat    . warfarin (COUMADIN) 5 MG tablet Take 5 mg by mouth 2 (two) times a week. Sunday and Wednesday     No current facility-administered medications for this visit.     Allergies:   Dilaudid [hydromorphone hcl]; Morphine and related; and Tegretol [carbamazepine]    Social History:   The patient  reports that he quit smoking about 19 years ago. His smoking use included cigarettes. He has a 144.00 pack-year smoking history. He has never used smokeless tobacco. He reports that he does not drink alcohol or use drugs.   Family History:  The patient's family history includes Hypertension in his mother; Other in his father.    ROS:  General:no colds or fevers, no weight changes Skin:no rashes or ulcers HEENT:no blurred vision, no congestion CV:see HPI PUL:see HPI GI:no diarrhea constipation or melena, no indigestion GU:no hematuria, no dysuria MS:no joint pain, no claudication Neuro:no syncope, no lightheadedness Endo:no diabetes, no thyroid disease  Wt Readings from Last 3 Encounters:  11/20/17 274 lb (124.3 kg)  11/15/17 270 lb (122.5 kg)  11/04/17 272 lb 9.6 oz (123.7 kg)     PHYSICAL EXAM: VS:  BP 116/66   Pulse 77   Ht _0  (1.727 m)   Wt 274 lb (124.3 kg)   SpO2 98%   BMI 41.66 kg/m  , BMI Body mass index is 41.66 kg/m. General:Pleasant affect, NAD Skin:Warm and dry, brisk capillary refill HEENT:normocephalic, sclera clear, mucus membranes moist Neck:supple, no JVD, no bruits  Heart:irreg irreg  without murmur, gallup, rub or click Lungs:clear without rales, rhonchi, or wheezes DXI:PJAS, non tender, + BS, do not palpate liver spleen or masses Ext:no lower ext edema, 2+ pedal pulses, 2+ radial pulses Neuro:alert and oriented, MAE, follows commands, + facial symmetry    EKG:  EKG is ordered today. The ekg ordered today demonstrates a fib , LAD, RBBB no acute changes   Recent Labs: 10/29/2017: B Natriuretic Peptide 539.2 10/30/2017: ALT 19; TSH 1.469 11/02/2017: Hemoglobin 14.7; Magnesium 1.9; Platelets 175 11/20/2017: BUN 15; Creatinine, Ser 1.02; Potassium 4.7; Sodium 147    Lipid Panel    Component Value Date/Time   CHOL 130 11/02/2017 0425   TRIG 81 11/02/2017 0425   HDL 31 (L) 11/02/2017 0425   CHOLHDL 4.2 11/02/2017 0425   VLDL 16  11/02/2017 0425   LDLCALC 83 11/02/2017 0425       Other studies Reviewed: Additional studies/ records that were reviewed today include: . Echo   8/27 Study Conclusions  - Left ventricle: The cavity size was mildly dilated. Wall thickness was normal. The estimated ejection fraction was 25%. Diffuse hypokinesis. The study was not technically sufficient to allow evaluation of LV diastolic dysfunction due to atrial fibrillation. - Aortic valve: There was no stenosis. There was trivial regurgitation. - Aorta: Mildly dilated aortic root. Aortic root dimension: 37 mm (ED). - Mitral valve: There was trivial regurgitation. -  Left atrium: The atrium was moderately dilated. - Right ventricle: The cavity size was normal. Systolic function was moderately reduced. - Right atrium: The atrium was mildly dilated. - Tricuspid valve: Peak RV-RA gradient (S): 31 mm Hg. - Pulmonary arteries: PA peak pressure: 34 mm Hg (S). - Inferior vena cava: The vessel was normal in size. The respirophasic diameter changes were in the normal range (>= 50%), consistent with normal central venous pressure. - Pericardium, extracardiac: A trivial pericardial effusion was identified.  Impressions:  - The patient was in atrial fibrillation. Mildly dilated LV with EF 25%, diffuse hypokinesis. Normal RV size with moderately decreased systolic function. No significant valvular abnormalities.    ASSESSMENT AND PLAN:  1.  Atrial fib, rate controled, with dig. And BB, will check dig level today.  BMP from PCP with Cr 0.9 K+ 4.2  Plan will be for DCCV in 3 weeks once he has 3 therapeutic INRs weekly.  Will plan DCCV now and if INR decreases will cancel.   Discussed at length with pt and family.  They are agreeable to proceed. Follow up post DCCv  2.. CM due to a fib and rapid rate now controlled.  On entresto but they do not believe they can afford.  I am holding off increasing dose  for now to allow more time on Entresto and if they cannot afford then will change to ARB.    3.    HTN controlled  4.   Chronic systolic and diastolic HF. euvolemic today   5.  CAD once cardioverted will discuss with Dr. [name] about stress test, hx of MI remotely no chest pain.    6.  Seizures per PCP    Current medicines are reviewed with the patient today.  The patient Has no concerns regarding medicines.  The following changes have been made:  See above Labs/ tests ordered today include:see above  Disposition:   FU:  see above  Signed, Cecilie Kicks, NP  11/21/2017 8:40 PM    [address], [place], [place] Phone: [phone]; Fax: [phone]

## 2017-11-21 ENCOUNTER — Encounter: Payer: Self-pay | Admitting: Cardiology

## 2017-11-21 DIAGNOSIS — Z7901 Long term (current) use of anticoagulants: Secondary | ICD-10-CM | POA: Diagnosis not present

## 2017-11-21 DIAGNOSIS — I4891 Unspecified atrial fibrillation: Secondary | ICD-10-CM | POA: Diagnosis not present

## 2017-11-21 LAB — BASIC METABOLIC PANEL
BUN/Creatinine Ratio: 15 (ref 10–24)
BUN: 15 mg/dL (ref 8–27)
CALCIUM: 8.8 mg/dL (ref 8.6–10.2)
CO2: 28 mmol/L (ref 20–29)
CREATININE: 1.02 mg/dL (ref 0.76–1.27)
Chloride: 104 mmol/L (ref 96–106)
GFR calc Af Amer: 83 mL/min/{1.73_m2} (ref >59)
GFR calc non Af Amer: 72 mL/min/{1.73_m2} (ref >59)
GLUCOSE: 100 mg/dL — AB (ref 65–99)
Potassium: 4.7 mmol/L (ref 3.5–5.2)
Sodium: 147 mmol/L — ABNORMAL HIGH (ref 134–144)

## 2017-11-21 LAB — PROTIME-INR: INR: 2.9 — AB (ref 0.9–1.1)

## 2017-11-21 LAB — DIGOXIN LEVEL: DIGOXIN, SERUM: 1.1 ng/mL — AB (ref 0.5–0.9)

## 2017-11-22 DIAGNOSIS — I420 Dilated cardiomyopathy: Secondary | ICD-10-CM | POA: Diagnosis not present

## 2017-11-22 DIAGNOSIS — I808 Phlebitis and thrombophlebitis of other sites: Secondary | ICD-10-CM | POA: Diagnosis not present

## 2017-11-22 DIAGNOSIS — I2510 Atherosclerotic heart disease of native coronary artery without angina pectoris: Secondary | ICD-10-CM | POA: Diagnosis not present

## 2017-11-22 DIAGNOSIS — I5043 Acute on chronic combined systolic (congestive) and diastolic (congestive) heart failure: Secondary | ICD-10-CM | POA: Diagnosis not present

## 2017-11-22 DIAGNOSIS — J449 Chronic obstructive pulmonary disease, unspecified: Secondary | ICD-10-CM | POA: Diagnosis not present

## 2017-11-22 DIAGNOSIS — I110 Hypertensive heart disease with heart failure: Secondary | ICD-10-CM | POA: Diagnosis not present

## 2017-11-22 DIAGNOSIS — I480 Paroxysmal atrial fibrillation: Secondary | ICD-10-CM | POA: Diagnosis not present

## 2017-11-22 DIAGNOSIS — L03113 Cellulitis of right upper limb: Secondary | ICD-10-CM | POA: Diagnosis not present

## 2017-11-22 DIAGNOSIS — J4540 Moderate persistent asthma, uncomplicated: Secondary | ICD-10-CM | POA: Diagnosis not present

## 2017-11-26 ENCOUNTER — Other Ambulatory Visit: Payer: Self-pay | Admitting: *Deleted

## 2017-11-26 NOTE — Patient Outreach (Signed)
[place]) Care Management  11/26/2017  Ruben Murray 1945-06-15 [id number]   Call received from [facility] wife stating home visit scheduled for next month will have to be rescheduled due to cardioversion on the same day.  She having procedure done today, will call back within the next week to schedule home visit for next month.  [name], [place], MSN [place] [phone]

## 2017-11-27 DIAGNOSIS — I420 Dilated cardiomyopathy: Secondary | ICD-10-CM | POA: Diagnosis not present

## 2017-11-27 DIAGNOSIS — J4540 Moderate persistent asthma, uncomplicated: Secondary | ICD-10-CM | POA: Diagnosis not present

## 2017-11-27 DIAGNOSIS — I5043 Acute on chronic combined systolic (congestive) and diastolic (congestive) heart failure: Secondary | ICD-10-CM | POA: Diagnosis not present

## 2017-11-27 DIAGNOSIS — I2510 Atherosclerotic heart disease of native coronary artery without angina pectoris: Secondary | ICD-10-CM | POA: Diagnosis not present

## 2017-11-27 DIAGNOSIS — L03113 Cellulitis of right upper limb: Secondary | ICD-10-CM | POA: Diagnosis not present

## 2017-11-27 DIAGNOSIS — I808 Phlebitis and thrombophlebitis of other sites: Secondary | ICD-10-CM | POA: Diagnosis not present

## 2017-11-27 DIAGNOSIS — I110 Hypertensive heart disease with heart failure: Secondary | ICD-10-CM | POA: Diagnosis not present

## 2017-11-27 DIAGNOSIS — I480 Paroxysmal atrial fibrillation: Secondary | ICD-10-CM | POA: Diagnosis not present

## 2017-11-27 DIAGNOSIS — J449 Chronic obstructive pulmonary disease, unspecified: Secondary | ICD-10-CM | POA: Diagnosis not present

## 2017-11-28 ENCOUNTER — Other Ambulatory Visit: Payer: Self-pay | Admitting: *Deleted

## 2017-11-28 DIAGNOSIS — I5043 Acute on chronic combined systolic (congestive) and diastolic (congestive) heart failure: Secondary | ICD-10-CM | POA: Diagnosis not present

## 2017-11-28 DIAGNOSIS — I480 Paroxysmal atrial fibrillation: Secondary | ICD-10-CM | POA: Diagnosis not present

## 2017-11-28 DIAGNOSIS — J449 Chronic obstructive pulmonary disease, unspecified: Secondary | ICD-10-CM | POA: Diagnosis not present

## 2017-11-28 DIAGNOSIS — I110 Hypertensive heart disease with heart failure: Secondary | ICD-10-CM | POA: Diagnosis not present

## 2017-11-28 DIAGNOSIS — I808 Phlebitis and thrombophlebitis of other sites: Secondary | ICD-10-CM | POA: Diagnosis not present

## 2017-11-28 DIAGNOSIS — J4540 Moderate persistent asthma, uncomplicated: Secondary | ICD-10-CM | POA: Diagnosis not present

## 2017-11-28 DIAGNOSIS — L03113 Cellulitis of right upper limb: Secondary | ICD-10-CM | POA: Diagnosis not present

## 2017-11-28 DIAGNOSIS — I420 Dilated cardiomyopathy: Secondary | ICD-10-CM | POA: Diagnosis not present

## 2017-11-28 DIAGNOSIS — I2510 Atherosclerotic heart disease of native coronary artery without angina pectoris: Secondary | ICD-10-CM | POA: Diagnosis not present

## 2017-11-28 DIAGNOSIS — Z7901 Long term (current) use of anticoagulants: Secondary | ICD-10-CM | POA: Diagnosis not present

## 2017-11-28 DIAGNOSIS — I4891 Unspecified atrial fibrillation: Secondary | ICD-10-CM | POA: Diagnosis not present

## 2017-11-28 LAB — PROTIME-INR: INR: 4.5 — AB (ref 0.9–1.1)

## 2017-11-29 ENCOUNTER — Other Ambulatory Visit: Payer: Self-pay | Admitting: *Deleted

## 2017-11-29 DIAGNOSIS — I5043 Acute on chronic combined systolic (congestive) and diastolic (congestive) heart failure: Secondary | ICD-10-CM | POA: Diagnosis not present

## 2017-11-29 DIAGNOSIS — J4540 Moderate persistent asthma, uncomplicated: Secondary | ICD-10-CM | POA: Diagnosis not present

## 2017-11-29 DIAGNOSIS — I480 Paroxysmal atrial fibrillation: Secondary | ICD-10-CM | POA: Diagnosis not present

## 2017-11-29 DIAGNOSIS — L03113 Cellulitis of right upper limb: Secondary | ICD-10-CM | POA: Diagnosis not present

## 2017-11-29 DIAGNOSIS — I2510 Atherosclerotic heart disease of native coronary artery without angina pectoris: Secondary | ICD-10-CM | POA: Diagnosis not present

## 2017-11-29 DIAGNOSIS — I808 Phlebitis and thrombophlebitis of other sites: Secondary | ICD-10-CM | POA: Diagnosis not present

## 2017-11-29 DIAGNOSIS — J449 Chronic obstructive pulmonary disease, unspecified: Secondary | ICD-10-CM | POA: Diagnosis not present

## 2017-11-29 DIAGNOSIS — I110 Hypertensive heart disease with heart failure: Secondary | ICD-10-CM | POA: Diagnosis not present

## 2017-11-29 DIAGNOSIS — I420 Dilated cardiomyopathy: Secondary | ICD-10-CM | POA: Diagnosis not present

## 2017-11-29 NOTE — Patient Outreach (Signed)
[name] Care Management  11/29/2017  Ruben Murray 27-Mar-1945 [id number]   Notified member triggered red on EMMI heart failure dashboard for new problems.  Call placed to member, no answer, HIPAA compliant voice message left.  Will await call back, will follow up within the next 4 business days.  [name], [place], MSN [place] [phone]

## 2017-11-29 NOTE — Patient Outreach (Signed)
[place] [facility]) Care Management  11/29/2017  Ruben Murray October 15, 1945 [id number]   Voice message received back from [facility] after missed call yesterday. This care manager inquired about new/worsening problems as indicated on EMMI call.  Wife report member is doing well, not having any new problems.  Continue to weigh daily, usually 270 pounds, today 268 pounds.  Compliant with medications, verbalizes understanding of cardioversion scheduled for 10/10.  Denies any urgent concerns, advised to contact this care manager with questions.  Will follow up with home visit within the next month.  [name], [place], MSN [place] [phone]

## 2017-11-30 DIAGNOSIS — I480 Paroxysmal atrial fibrillation: Secondary | ICD-10-CM | POA: Diagnosis not present

## 2017-11-30 DIAGNOSIS — I420 Dilated cardiomyopathy: Secondary | ICD-10-CM | POA: Diagnosis not present

## 2017-11-30 DIAGNOSIS — I2510 Atherosclerotic heart disease of native coronary artery without angina pectoris: Secondary | ICD-10-CM | POA: Diagnosis not present

## 2017-11-30 DIAGNOSIS — I5043 Acute on chronic combined systolic (congestive) and diastolic (congestive) heart failure: Secondary | ICD-10-CM | POA: Diagnosis not present

## 2017-11-30 DIAGNOSIS — I808 Phlebitis and thrombophlebitis of other sites: Secondary | ICD-10-CM | POA: Diagnosis not present

## 2017-11-30 DIAGNOSIS — J449 Chronic obstructive pulmonary disease, unspecified: Secondary | ICD-10-CM | POA: Diagnosis not present

## 2017-11-30 DIAGNOSIS — I110 Hypertensive heart disease with heart failure: Secondary | ICD-10-CM | POA: Diagnosis not present

## 2017-11-30 DIAGNOSIS — J4540 Moderate persistent asthma, uncomplicated: Secondary | ICD-10-CM | POA: Diagnosis not present

## 2017-11-30 DIAGNOSIS — L03113 Cellulitis of right upper limb: Secondary | ICD-10-CM | POA: Diagnosis not present

## 2017-12-03 DIAGNOSIS — M76821 Posterior tibial tendinitis, right leg: Secondary | ICD-10-CM | POA: Diagnosis not present

## 2017-12-04 ENCOUNTER — Other Ambulatory Visit: Payer: Self-pay | Admitting: *Deleted

## 2017-12-04 DIAGNOSIS — J4540 Moderate persistent asthma, uncomplicated: Secondary | ICD-10-CM | POA: Diagnosis not present

## 2017-12-04 DIAGNOSIS — I808 Phlebitis and thrombophlebitis of other sites: Secondary | ICD-10-CM | POA: Diagnosis not present

## 2017-12-04 DIAGNOSIS — I480 Paroxysmal atrial fibrillation: Secondary | ICD-10-CM | POA: Diagnosis not present

## 2017-12-04 DIAGNOSIS — J449 Chronic obstructive pulmonary disease, unspecified: Secondary | ICD-10-CM | POA: Diagnosis not present

## 2017-12-04 DIAGNOSIS — L03113 Cellulitis of right upper limb: Secondary | ICD-10-CM | POA: Diagnosis not present

## 2017-12-04 DIAGNOSIS — I420 Dilated cardiomyopathy: Secondary | ICD-10-CM | POA: Diagnosis not present

## 2017-12-04 DIAGNOSIS — I2510 Atherosclerotic heart disease of native coronary artery without angina pectoris: Secondary | ICD-10-CM | POA: Diagnosis not present

## 2017-12-04 DIAGNOSIS — I110 Hypertensive heart disease with heart failure: Secondary | ICD-10-CM | POA: Diagnosis not present

## 2017-12-04 DIAGNOSIS — I5043 Acute on chronic combined systolic (congestive) and diastolic (congestive) heart failure: Secondary | ICD-10-CM | POA: Diagnosis not present

## 2017-12-04 NOTE — Telephone Encounter (Signed)
Okay to refill medications as requested? Dr [name] has never filled them. Please advise. Thanks, MI

## 2017-12-04 NOTE — Telephone Encounter (Signed)
OK to refill medications - he is a pt of Dr [name] and has recently been seen by Cecilie Kicks, NP.

## 2017-12-05 DIAGNOSIS — Z7901 Long term (current) use of anticoagulants: Secondary | ICD-10-CM | POA: Diagnosis not present

## 2017-12-05 DIAGNOSIS — I4891 Unspecified atrial fibrillation: Secondary | ICD-10-CM | POA: Diagnosis not present

## 2017-12-05 DIAGNOSIS — I2510 Atherosclerotic heart disease of native coronary artery without angina pectoris: Secondary | ICD-10-CM | POA: Diagnosis not present

## 2017-12-05 DIAGNOSIS — J4540 Moderate persistent asthma, uncomplicated: Secondary | ICD-10-CM | POA: Diagnosis not present

## 2017-12-05 DIAGNOSIS — I420 Dilated cardiomyopathy: Secondary | ICD-10-CM | POA: Diagnosis not present

## 2017-12-05 DIAGNOSIS — I808 Phlebitis and thrombophlebitis of other sites: Secondary | ICD-10-CM | POA: Diagnosis not present

## 2017-12-05 DIAGNOSIS — L03113 Cellulitis of right upper limb: Secondary | ICD-10-CM | POA: Diagnosis not present

## 2017-12-05 DIAGNOSIS — I5043 Acute on chronic combined systolic (congestive) and diastolic (congestive) heart failure: Secondary | ICD-10-CM | POA: Diagnosis not present

## 2017-12-05 DIAGNOSIS — I110 Hypertensive heart disease with heart failure: Secondary | ICD-10-CM | POA: Diagnosis not present

## 2017-12-05 DIAGNOSIS — R945 Abnormal results of liver function studies: Secondary | ICD-10-CM | POA: Diagnosis not present

## 2017-12-05 DIAGNOSIS — I480 Paroxysmal atrial fibrillation: Secondary | ICD-10-CM | POA: Diagnosis not present

## 2017-12-05 DIAGNOSIS — J449 Chronic obstructive pulmonary disease, unspecified: Secondary | ICD-10-CM | POA: Diagnosis not present

## 2017-12-05 MED ORDER — FUROSEMIDE 40 MG PO TABS: 40.0000 mg | ORAL_TABLET | Freq: Every day | ORAL | 11 refills | Status: DC

## 2017-12-05 MED ORDER — SACUBITRIL-VALSARTAN 24-26 MG PO TABS: 1.0000 | ORAL_TABLET | Freq: Two times a day (BID) | ORAL | 11 refills | Status: DC

## 2017-12-05 MED ORDER — POTASSIUM CHLORIDE CRYS ER 20 MEQ PO TBCR: 20.0000 meq | EXTENDED_RELEASE_TABLET | Freq: Every day | ORAL | 11 refills | Status: DC

## 2017-12-05 MED ORDER — DIGOXIN 250 MCG PO TABS: 0.2500 mg | ORAL_TABLET | Freq: Every day | ORAL | 11 refills | Status: DC

## 2017-12-05 MED ORDER — METOPROLOL TARTRATE 100 MG PO TABS: 100.0000 mg | ORAL_TABLET | Freq: Two times a day (BID) | ORAL | 11 refills | Status: DC

## 2017-12-06 ENCOUNTER — Telehealth: Payer: Self-pay

## 2017-12-06 DIAGNOSIS — I10 Essential (primary) hypertension: Secondary | ICD-10-CM

## 2017-12-06 DIAGNOSIS — J449 Chronic obstructive pulmonary disease, unspecified: Secondary | ICD-10-CM

## 2017-12-06 DIAGNOSIS — I480 Paroxysmal atrial fibrillation: Secondary | ICD-10-CM

## 2017-12-06 DIAGNOSIS — I420 Dilated cardiomyopathy: Secondary | ICD-10-CM

## 2017-12-06 NOTE — Telephone Encounter (Signed)
Advised pt that he needs more labs drawn prior to cardioversion and will come by the office 10/7 or 10/8 to have them drawn.

## 2017-12-07 ENCOUNTER — Telehealth: Payer: Self-pay | Admitting: Cardiology

## 2017-12-07 DIAGNOSIS — I2510 Atherosclerotic heart disease of native coronary artery without angina pectoris: Secondary | ICD-10-CM | POA: Diagnosis not present

## 2017-12-07 DIAGNOSIS — I110 Hypertensive heart disease with heart failure: Secondary | ICD-10-CM | POA: Diagnosis not present

## 2017-12-07 DIAGNOSIS — I420 Dilated cardiomyopathy: Secondary | ICD-10-CM | POA: Diagnosis not present

## 2017-12-07 DIAGNOSIS — I808 Phlebitis and thrombophlebitis of other sites: Secondary | ICD-10-CM | POA: Diagnosis not present

## 2017-12-07 DIAGNOSIS — L03113 Cellulitis of right upper limb: Secondary | ICD-10-CM | POA: Diagnosis not present

## 2017-12-07 DIAGNOSIS — I480 Paroxysmal atrial fibrillation: Secondary | ICD-10-CM | POA: Diagnosis not present

## 2017-12-07 DIAGNOSIS — I5043 Acute on chronic combined systolic (congestive) and diastolic (congestive) heart failure: Secondary | ICD-10-CM | POA: Diagnosis not present

## 2017-12-07 DIAGNOSIS — J4540 Moderate persistent asthma, uncomplicated: Secondary | ICD-10-CM | POA: Diagnosis not present

## 2017-12-07 DIAGNOSIS — J449 Chronic obstructive pulmonary disease, unspecified: Secondary | ICD-10-CM | POA: Diagnosis not present

## 2017-12-07 NOTE — Telephone Encounter (Signed)
Spoke to patient's wife who said that she went to pick up [facility] and was told that the medication was not covered by insurance.  The patient will call the insurance company and ask if a prior Josem Kaufmann is required and then will call us back.

## 2017-12-07 NOTE — Telephone Encounter (Signed)
Spoke to patient's wife who said that they need a prior auth for the Entresto 24-26 mg.  I will forward to [place] for further assistance.

## 2017-12-07 NOTE — Telephone Encounter (Signed)
New Message:     Pt c/o medication issue:  1. Name of Medication: sacubitril-valsartan (ENTRESTO) 24-26 MG  2. How are you currently taking this medication (dosage and times per day)? Take 1 tablet by mouth 2 (two) times daily.  3. Are you having a reaction (difficulty breathing--STAT)? No  4. What is your medication issue? Pt's wife states this medication is not covered by insurance and they need something different and the pt is almost out of the samples.

## 2017-12-10 ENCOUNTER — Other Ambulatory Visit: Payer: Medicare HMO | Admitting: *Deleted

## 2017-12-10 DIAGNOSIS — I420 Dilated cardiomyopathy: Secondary | ICD-10-CM

## 2017-12-10 DIAGNOSIS — J449 Chronic obstructive pulmonary disease, unspecified: Secondary | ICD-10-CM

## 2017-12-10 DIAGNOSIS — I10 Essential (primary) hypertension: Secondary | ICD-10-CM | POA: Diagnosis not present

## 2017-12-10 DIAGNOSIS — I480 Paroxysmal atrial fibrillation: Secondary | ICD-10-CM | POA: Diagnosis not present

## 2017-12-11 DIAGNOSIS — J4540 Moderate persistent asthma, uncomplicated: Secondary | ICD-10-CM | POA: Diagnosis not present

## 2017-12-11 DIAGNOSIS — I808 Phlebitis and thrombophlebitis of other sites: Secondary | ICD-10-CM | POA: Diagnosis not present

## 2017-12-11 DIAGNOSIS — I110 Hypertensive heart disease with heart failure: Secondary | ICD-10-CM | POA: Diagnosis not present

## 2017-12-11 DIAGNOSIS — I5043 Acute on chronic combined systolic (congestive) and diastolic (congestive) heart failure: Secondary | ICD-10-CM | POA: Diagnosis not present

## 2017-12-11 DIAGNOSIS — I420 Dilated cardiomyopathy: Secondary | ICD-10-CM | POA: Diagnosis not present

## 2017-12-11 DIAGNOSIS — L03113 Cellulitis of right upper limb: Secondary | ICD-10-CM | POA: Diagnosis not present

## 2017-12-11 DIAGNOSIS — J449 Chronic obstructive pulmonary disease, unspecified: Secondary | ICD-10-CM | POA: Diagnosis not present

## 2017-12-11 DIAGNOSIS — I2510 Atherosclerotic heart disease of native coronary artery without angina pectoris: Secondary | ICD-10-CM | POA: Diagnosis not present

## 2017-12-11 DIAGNOSIS — I480 Paroxysmal atrial fibrillation: Secondary | ICD-10-CM | POA: Diagnosis not present

## 2017-12-11 LAB — BASIC METABOLIC PANEL
BUN / CREAT RATIO: 16 (ref 10–24)
BUN: 14 mg/dL (ref 8–27)
CALCIUM: 8.6 mg/dL (ref 8.6–10.2)
CO2: 29 mmol/L (ref 20–29)
CREATININE: 0.85 mg/dL (ref 0.76–1.27)
Chloride: 102 mmol/L (ref 96–106)
GFR calc Af Amer: 99 mL/min/{1.73_m2} (ref >59)
GFR calc non Af Amer: 86 mL/min/{1.73_m2} (ref >59)
Glucose: 83 mg/dL (ref 65–99)
POTASSIUM: 4.5 mmol/L (ref 3.5–5.2)
SODIUM: 144 mmol/L (ref 134–144)

## 2017-12-11 LAB — CBC
Hematocrit: 46.4 % (ref 37.5–51.0)
Hemoglobin: 15.6 g/dL (ref 13.0–17.7)
MCH: 30.2 pg (ref 26.6–33.0)
MCHC: 33.6 g/dL (ref 31.5–35.7)
MCV: 90 fL (ref 79–97)
PLATELETS: 176 10*3/uL (ref 150–450)
RBC: 5.16 x10E6/uL (ref 4.14–5.80)
RDW: 14.1 % (ref 12.3–15.4)
WBC: 8.3 10*3/uL (ref 3.4–10.8)

## 2017-12-11 LAB — PROTIME-INR
INR: 3.6 — AB (ref 0.8–1.2)
Prothrombin Time: 33.3 s — ABNORMAL HIGH (ref 9.1–12.0)

## 2017-12-12 DIAGNOSIS — I808 Phlebitis and thrombophlebitis of other sites: Secondary | ICD-10-CM | POA: Diagnosis not present

## 2017-12-12 DIAGNOSIS — L03113 Cellulitis of right upper limb: Secondary | ICD-10-CM | POA: Diagnosis not present

## 2017-12-12 DIAGNOSIS — I2510 Atherosclerotic heart disease of native coronary artery without angina pectoris: Secondary | ICD-10-CM | POA: Diagnosis not present

## 2017-12-12 DIAGNOSIS — I420 Dilated cardiomyopathy: Secondary | ICD-10-CM | POA: Diagnosis not present

## 2017-12-12 DIAGNOSIS — I4891 Unspecified atrial fibrillation: Secondary | ICD-10-CM | POA: Diagnosis not present

## 2017-12-12 DIAGNOSIS — Z7901 Long term (current) use of anticoagulants: Secondary | ICD-10-CM | POA: Diagnosis not present

## 2017-12-12 DIAGNOSIS — I480 Paroxysmal atrial fibrillation: Secondary | ICD-10-CM | POA: Diagnosis not present

## 2017-12-12 DIAGNOSIS — I110 Hypertensive heart disease with heart failure: Secondary | ICD-10-CM | POA: Diagnosis not present

## 2017-12-12 DIAGNOSIS — J4540 Moderate persistent asthma, uncomplicated: Secondary | ICD-10-CM | POA: Diagnosis not present

## 2017-12-12 DIAGNOSIS — J449 Chronic obstructive pulmonary disease, unspecified: Secondary | ICD-10-CM | POA: Diagnosis not present

## 2017-12-12 DIAGNOSIS — I5043 Acute on chronic combined systolic (congestive) and diastolic (congestive) heart failure: Secondary | ICD-10-CM | POA: Diagnosis not present

## 2017-12-13 ENCOUNTER — Encounter (HOSPITAL_COMMUNITY): Payer: Self-pay | Admitting: Cardiovascular Disease

## 2017-12-13 ENCOUNTER — Encounter (HOSPITAL_COMMUNITY)
Admission: RE | Disposition: A | Discharge status: Home / Self Care | Payer: Self-pay | Source: Ambulatory Visit | Attending: Cardiovascular Disease

## 2017-12-13 ENCOUNTER — Ambulatory Visit (HOSPITAL_COMMUNITY)
Admission: RE | Admit: 2017-12-13 | Discharge: 2017-12-13 | Disposition: A | Discharge status: Home / Self Care | Payer: Medicare HMO | Source: Ambulatory Visit | Attending: Cardiovascular Disease | Admitting: Cardiovascular Disease

## 2017-12-13 ENCOUNTER — Ambulatory Visit (HOSPITAL_COMMUNITY): Payer: Medicare HMO | Admitting: Anesthesiology

## 2017-12-13 ENCOUNTER — Ambulatory Visit: Payer: Medicare HMO | Admitting: *Deleted

## 2017-12-13 DIAGNOSIS — I4819 Other persistent atrial fibrillation: Secondary | ICD-10-CM

## 2017-12-13 DIAGNOSIS — I480 Paroxysmal atrial fibrillation: Secondary | ICD-10-CM | POA: Insufficient documentation

## 2017-12-13 DIAGNOSIS — Z87891 Personal history of nicotine dependence: Secondary | ICD-10-CM | POA: Diagnosis not present

## 2017-12-13 DIAGNOSIS — Z6841 Body Mass Index (BMI) 40.0 and over, adult: Secondary | ICD-10-CM | POA: Insufficient documentation

## 2017-12-13 DIAGNOSIS — I5042 Chronic combined systolic (congestive) and diastolic (congestive) heart failure: Secondary | ICD-10-CM | POA: Diagnosis not present

## 2017-12-13 DIAGNOSIS — Z7901 Long term (current) use of anticoagulants: Secondary | ICD-10-CM | POA: Diagnosis not present

## 2017-12-13 DIAGNOSIS — I252 Old myocardial infarction: Secondary | ICD-10-CM | POA: Insufficient documentation

## 2017-12-13 DIAGNOSIS — I700 Atherosclerosis of aorta: Secondary | ICD-10-CM | POA: Insufficient documentation

## 2017-12-13 DIAGNOSIS — I429 Cardiomyopathy, unspecified: Secondary | ICD-10-CM | POA: Diagnosis not present

## 2017-12-13 DIAGNOSIS — G4733 Obstructive sleep apnea (adult) (pediatric): Secondary | ICD-10-CM | POA: Diagnosis not present

## 2017-12-13 DIAGNOSIS — I110 Hypertensive heart disease with heart failure: Secondary | ICD-10-CM | POA: Insufficient documentation

## 2017-12-13 DIAGNOSIS — I491 Atrial premature depolarization: Secondary | ICD-10-CM | POA: Insufficient documentation

## 2017-12-13 DIAGNOSIS — E785 Hyperlipidemia, unspecified: Secondary | ICD-10-CM | POA: Insufficient documentation

## 2017-12-13 DIAGNOSIS — Z885 Allergy status to narcotic agent status: Secondary | ICD-10-CM | POA: Insufficient documentation

## 2017-12-13 DIAGNOSIS — Z8249 Family history of ischemic heart disease and other diseases of the circulatory system: Secondary | ICD-10-CM | POA: Diagnosis not present

## 2017-12-13 DIAGNOSIS — Z7951 Long term (current) use of inhaled steroids: Secondary | ICD-10-CM | POA: Insufficient documentation

## 2017-12-13 DIAGNOSIS — I2510 Atherosclerotic heart disease of native coronary artery without angina pectoris: Secondary | ICD-10-CM | POA: Diagnosis not present

## 2017-12-13 DIAGNOSIS — I4891 Unspecified atrial fibrillation: Secondary | ICD-10-CM | POA: Diagnosis not present

## 2017-12-13 DIAGNOSIS — I5043 Acute on chronic combined systolic (congestive) and diastolic (congestive) heart failure: Secondary | ICD-10-CM | POA: Diagnosis not present

## 2017-12-13 DIAGNOSIS — I4510 Unspecified right bundle-branch block: Secondary | ICD-10-CM | POA: Diagnosis not present

## 2017-12-13 HISTORY — PX: CARDIOVERSION: SHX1299

## 2017-12-13 SURGERY — CARDIOVERSION
Anesthesia: General

## 2017-12-13 MED ORDER — SODIUM CHLORIDE 0.9 % IV SOLN
INTRAVENOUS | Status: DC
  Administered 2017-12-13: 11:00:00 via INTRAVENOUS

## 2017-12-13 MED ORDER — LIDOCAINE 2% (20 MG/ML) 5 ML SYRINGE
INTRAMUSCULAR | Status: DC | PRN
  Administered 2017-12-13: 60 mg via INTRAVENOUS

## 2017-12-13 MED ORDER — PROPOFOL 10 MG/ML IV BOLUS
INTRAVENOUS | Status: DC | PRN
  Administered 2017-12-13: 50 mg via INTRAVENOUS

## 2017-12-13 MED ORDER — LACTATED RINGERS IV SOLN
INTRAVENOUS | Status: DC | PRN
  Administered 2017-12-13: 12:00:00 via INTRAVENOUS

## 2017-12-13 NOTE — Anesthesia Preprocedure Evaluation (Signed)
Anesthesia Evaluation  Patient identified by MRN, date of birth, ID band Patient awake    Reviewed: Allergy & Precautions, NPO status , Patient's Chart, lab work & pertinent test results, reviewed documented beta blocker date and time   Airway Mallampati: III       Dental  (+) Teeth Intact   Pulmonary former smoker,    Pulmonary exam normal breath sounds clear to auscultation       Cardiovascular hypertension, Pt. on home beta blockers and Pt. on medications + Past MI and +CHF   Rhythm:Irregular Rate:Normal     Neuro/Psych    GI/Hepatic negative GI ROS, Neg liver ROS,   Endo/Other  Morbid obesity  Renal/GU negative Renal ROS     Musculoskeletal   Abdominal (+) + obese,   Peds  Hematology   Anesthesia Other Findings Ruben Murray  ECHO COMPLETE WITH IMAGING ENHANCING AGENT  Order# [id number]  Reading physician: Larey Dresser, MD Ordering physician: Toy Baker, MD Study date: 10/30/17 Study Result   Result status: Final result                             *[facility]*                         [address], [place] [address]                            [phone]  ------------------------------------------------------------------- Transthoracic Echocardiography  Patient:    Ruben Murray, Ruben Murray MR #:       [id number] Study Date: 10/30/2017 Gender:     M Age:        72 Height:     172.7 cm Weight:     130.3 kg BSA:        2.57 m^2 Pt. Status: Room:       [address]    Toy Baker [id number]  [id number]     NIDPOEU, MPNTIRWERX [id number]  Esau Grew [id number]  ATTENDING    Caren Griffins  PERFORMING   [facility], Inpatient  SONOGRAPHER  Dance, Tiffany  cc:  ------------------------------------------------------------------- LV EF:  25%  ------------------------------------------------------------------- Indications:      CHF - 428.0.  ------------------------------------------------------------------- History:   PMH:  Seizures.  Myocardial infarction.  Risk factors: Hypertension. Dyslipidemia.  ------------------------------------------------------------------- Study Conclusions  - Left ventricle: The cavity size was mildly dilated. Wall   thickness was normal. The estimated ejection fraction was 25%.   Diffuse hypokinesis. The study was not technically sufficient to   allow evaluation of LV diastolic dysfunction due to atrial   fibrillation. - Aortic valve: There was no stenosis. There was trivial   regurgitation. - Aorta: Mildly dilated aortic root. Aortic root dimension: 37 mm   (ED). - Mitral valve: There was trivial regurgitation. - Left atrium: The atrium was moderately dilated. - Right ventricle: The cavity size was normal. Systolic function   was moderately reduced. - Right atrium: The atrium was mildly dilated. - Tricuspid valve: Peak RV-RA gradient (S): 31 mm Hg. - Pulmonary arteries: PA peak pressure: 34  mm Hg (S). - Inferior vena cava: The vessel was normal in size. The   respirophasic diameter changes were in the normal range (>= 50%),   consistent with normal central venous pressure. - Pericardium, extracardiac: A trivial pericardial effusion was   identified.  Impressions:  - The patient was in atrial fibrillation. Mildly dilated LV with EF   25%, diffuse hypokinesis. Normal RV size with moderately   decreased systolic function. No significant valvular   abnormalities.  ------------------------------------------------------------------- Study data:  Comparison was made to the study of 04/26/2016.  Study status:  Routine.  Procedure:  The patient reported no pain pre or post test. Transthoracic echocardiography. Image quality was adequate. Intravenous contrast (Definity) was  administered.  Study completion:  There were no complications.          Transthoracic echocardiography.  M-mode, complete 2D, spectral Doppler, and color Doppler.  Birthdate:  Patient birthdate: December 23, 1945.  Age:  Patient is 72 yr old.  Sex:  Gender: male.    BMI: 43.7 kg/m^2.  Blood pressure:     116/86  Patient status:  Inpatient.  Study date: Study date: 10/30/2017. Study time: 02:17 PM.  Location:  Bedside.   -------------------------------------------------------------------  ------------------------------------------------------------------- Left ventricle:  The cavity size was mildly dilated. Wall thickness was normal. The estimated ejection fraction was 25%. Diffuse hypokinesis. The study was not technically sufficient to allow evaluation of LV diastolic dysfunction due to atrial fibrillation.   ------------------------------------------------------------------- Aortic valve:   Trileaflet.  Doppler:   There was no stenosis. There was trivial regurgitation.  ------------------------------------------------------------------- Aorta:  Mildly dilated aortic root.  ------------------------------------------------------------------- Mitral valve:   Normal thickness leaflets .  Doppler:   There was no evidence for stenosis.   There was trivial regurgitation. Valve area by pressure half-time: 4.07 cm^2. Indexed valve area by pressure half-time: 1.59 cm^2/m^2.  ------------------------------------------------------------------- Left atrium:  The atrium was moderately dilated.  ------------------------------------------------------------------- Right ventricle:  The cavity size was normal. Systolic function was moderately reduced.  ------------------------------------------------------------------- Pulmonic valve:    Structurally normal valve.   Cusp separation was normal.  Doppler:  Transvalvular velocity was within the normal range. There was trivial  regurgitation.  ------------------------------------------------------------------- Tricuspid valve:   Doppler:  There was trivial regurgitation.   ------------------------------------------------------------------- Right atrium:  The atrium was mildly dilated.  ------------------------------------------------------------------- Pericardium:  A trivial pericardial effusion was identified.  ------------------------------------------------------------------- Systemic veins: Inferior vena cava: The vessel was normal in size. The respirophasic diameter changes were in the normal range (>= 50%), consistent with normal central venous pressure.  ------------------------------------------------------------------- Post procedure conclusions Ascending Aorta:  - Mildly dilated aortic root.  ------------------------------------------------------------------- Measurements   Left ventricle                          Value          Reference  LV ID, ED, PLAX chordal         (H)     61    mm       43 - 52  LV ID, ES, PLAX chordal         (H)     49    mm       23 - 38  LV fx shortening, PLAX chordal  (L)     20    %        >=29  LV PW thickness, ED  12    mm       ----------  IVS/LV PW ratio, ED                     0.92           <=1.3    Ventricular septum                      Value          Reference  IVS thickness, ED                       11    mm       ----------    LVOT                                    Value          Reference  LVOT ID, S                              24    mm       ----------  LVOT area                               4.52  cm^2     ----------    Aorta                                   Value          Reference  Aortic root ID, ED                      37    mm       ----------  Ascending aorta ID, A-P, S              31    mm       ----------    Left atrium                             Value          Reference  LA ID, A-P, ES                           53    mm       ----------  LA ID/bsa, A-P                          2.07  cm/m^2   <=2.2  LA volume, S                            89.8  ml       ----------  LA volume/bsa, S                        35    ml/m^2   ----------  LA volume, ES, 1-p A4C                  100  ml       ----------  LA volume/bsa, ES, 1-p A4C              39    ml/m^2   ----------  LA volume, ES, 1-p A2C                  80.8  ml       ----------  LA volume/bsa, ES, 1-p A2C              31.5  ml/m^2   ----------    Mitral valve                            Value          Reference  Mitral E-wave peak velocity             66    cm/s     ----------  Mitral A-wave peak velocity             33.5  cm/s     ----------  Mitral deceleration time                183   ms       150 - 230  Mitral pressure half-time               54    ms       ----------  Mitral E/A ratio, peak                  2              ----------  Mitral valve area, PHT, DP              4.07  cm^2     ----------  Mitral valve area/bsa, PHT, DP          1.59  cm^2/m^2 ----------    Pulmonary arteries                      Value          Reference  PA pressure, S, DP              (H)     34    mm Hg    <=30    Tricuspid valve                         Value          Reference  Tricuspid regurg peak velocity          277   cm/s     ----------  Tricuspid peak RV-RA gradient           31    mm Hg    ----------    Right atrium                            Value          Reference  RA ID, S-I, ES, A4C             (H)     66.9  mm       34 - 49  RA area, ES, A4C                (H)     23.9  cm^2  8.3 - 19.5  RA volume, ES, A/L                      68.5  ml       ----------  RA volume/bsa, ES, A/L                  26.7  ml/m^2   ----------    Systemic veins                          Value          Reference  Estimated CVP                           10    mm Hg    ----------    Right ventricle                         Value          Reference  RV ID, minor  axis, ED, A4C base         35    mm       ----------  RV ID, minor axis, ED, A4C mid          23    mm       ----------  RV ID, major axis, ED, A4C              81    mm       55 - 91  TAPSE                                   16.5  mm       ----------  RV s&', lateral, S                       7.72  cm/s     ----------    Pulmonic valve                          Value          Reference  Pulmonic regurg velocity, ED            152   cm/s     ----------  Pulmonic regurg gradient, ED            9     mm Hg    ----------  Legend: (L)  and  (H)  mark values outside specified reference range.  ------------------------------------------------------------------- Prepared and Electronically Authenticated by  Loralie Champagne, M.D. 2019-08-27T18:10:18 MERGE Images   Show images for ECHOCARDIOGRAM COMPLETE Patient Information   Patient Name Ruben Murray, Ruben Murray Sex Male DOB [birth year] SSN [id number] Reason for Exam  Priority: Routine  Comments:  Surgical History   Surgical History    No past medical history on file.  Other Surgical History    Procedure Laterality Date Comment Source CIRCUMCISION    Provider JOINT REPLACEMENT    Provider PERCUTANEOUS PINNING TOE FRACTURE Left  "big toe Provider REPLACEMENT TOTAL KNEE Left   Provider TONSILLECTOMY AND ADENOIDECTOMY    Provider  Patient Data   Height  68 in  BP  92/66 mmHg    Performing Technologist/Nurse   Performing Technologist/Nurse: Dance, Randa Lynn  Implants    No active implants to display in this view. Order-Level Documents:   There are no order-level documents.  Encounter-Level Documents - 10/29/2017:   Document on 11/04/2017 1:39 PM by Julianne Handler, RN: IP After Visit Summary  Document on 11/04/2017 1:26 PM by Julianne Handler, RN: IP After Visit Summary  Document on 11/02/2017 9:55 PM by Kenton Kingfisher: ED PB Billing Extract  Scan on 10/30/2017 10:52 AM by Default, Provider, MDScan on  10/30/2017 10:52 AM by Default, Provider, MD  Scan on 11/04/2017 9:05 AM by Default, Provider, MDScan on 11/04/2017 9:05 AM by Default, Provider, MD  Scan on 11/04/2017 2:45 AM by Default, Provider, MDScan on 11/04/2017 2:45 AM by Default, Provider, MD  Electronic signature on 10/29/2017 8:22 PM - Signed  Electronic signature on 10/29/2017 8:21 PM - Signed    Signed   Electronically signed by Larey Dresser, MD on 10/30/17 at 1810 EDT Printable Result Report    Result Report  External Result Report    External Result Report     Reproductive/Obstetrics                             Anesthesia Physical Anesthesia Plan  ASA: III  Anesthesia Plan: General   Post-op Pain Management:    Induction:   PONV Risk Score and Plan:   Airway Management Planned: Natural Airway and Simple Face Mask  Additional Equipment:   Intra-op Plan:   Post-operative Plan:   Informed Consent: I have reviewed the patients History and Physical, chart, labs and discussed the procedure including the risks, benefits and alternatives for the proposed anesthesia with the patient or authorized representative who has indicated his/her understanding and acceptance.   Dental advisory given  Plan Discussed with: CRNA and Surgeon  Anesthesia Plan Comments:         Anesthesia Quick Evaluation

## 2017-12-13 NOTE — Transfer of Care (Signed)
Immediate Anesthesia Transfer of Care Note  Patient: Ruben Murray  Procedure(s) Performed: CARDIOVERSION (N/A )  Patient Location: Endoscopy Unit  Anesthesia Type:MAC  Level of Consciousness: sedated  Airway & Oxygen Therapy: Patient Spontanous Breathing  Post-op Assessment: Report given to RN and Post -op Vital signs reviewed and stable  Post vital signs: Reviewed and stable  Last Vitals:  Vitals Value Taken Time  BP    Temp    Pulse    Resp    SpO2      Last Pain:  Vitals:   12/13/17 1210  TempSrc:   PainSc: (P) 0-No pain         Complications: No apparent anesthesia complications

## 2017-12-13 NOTE — Interval H&P Note (Signed)
History and Physical Interval Note:  12/13/2017 12:01 PM  Ruben Murray  has presented today for surgery, with the diagnosis of AFIB  The various methods of treatment have been discussed with the patient and family. After consideration of risks, benefits and other options for treatment, the patient has consented to  Procedure(s): CARDIOVERSION (N/A) as a surgical intervention .  The patient's history has been reviewed, patient examined, no change in status, stable for surgery.  I have reviewed the patient's chart and labs.  Questions were answered to the patient's satisfaction.     [name], MD

## 2017-12-13 NOTE — CV Procedure (Signed)
Electrical Cardioversion Procedure Note CALIX HEINBAUGH [id number] [birth year]  Procedure: Electrical Cardioversion Indications:  Atrial Fibrillation  Procedure Details Consent: Risks of procedure as well as the alternatives and risks of each were explained to the (patient/caregiver).  Consent for procedure obtained. Time Out: Verified patient identification, verified procedure, site/side was marked, verified correct patient position, special equipment/implants available, medications/allergies/relevent history reviewed, required imaging and test results available.  Performed  Patient placed on cardiac monitor, pulse oximetry, supplemental oxygen as necessary.  Sedation given: propofol Pacer pads placed anterior and posterior chest.  Cardioverted 1 time(s).  Cardioverted at 150J.  Evaluation Findings: Post procedure EKG shows: NSR Complications: None Patient did tolerate procedure well.   [name], MD 12/13/2017, 12:11 PM

## 2017-12-14 DIAGNOSIS — L03113 Cellulitis of right upper limb: Secondary | ICD-10-CM | POA: Diagnosis not present

## 2017-12-14 DIAGNOSIS — I5043 Acute on chronic combined systolic (congestive) and diastolic (congestive) heart failure: Secondary | ICD-10-CM | POA: Diagnosis not present

## 2017-12-14 DIAGNOSIS — I110 Hypertensive heart disease with heart failure: Secondary | ICD-10-CM | POA: Diagnosis not present

## 2017-12-14 DIAGNOSIS — I480 Paroxysmal atrial fibrillation: Secondary | ICD-10-CM | POA: Diagnosis not present

## 2017-12-14 DIAGNOSIS — I808 Phlebitis and thrombophlebitis of other sites: Secondary | ICD-10-CM | POA: Diagnosis not present

## 2017-12-14 DIAGNOSIS — I2510 Atherosclerotic heart disease of native coronary artery without angina pectoris: Secondary | ICD-10-CM | POA: Diagnosis not present

## 2017-12-14 DIAGNOSIS — J4540 Moderate persistent asthma, uncomplicated: Secondary | ICD-10-CM | POA: Diagnosis not present

## 2017-12-14 DIAGNOSIS — I420 Dilated cardiomyopathy: Secondary | ICD-10-CM | POA: Diagnosis not present

## 2017-12-14 DIAGNOSIS — J449 Chronic obstructive pulmonary disease, unspecified: Secondary | ICD-10-CM | POA: Diagnosis not present

## 2017-12-14 NOTE — Telephone Encounter (Signed)
I have done an [place] PA through covermymeds. Key: [id number]

## 2017-12-14 NOTE — Telephone Encounter (Signed)
**Note De-Identified Elayah Klooster Obfuscation** Letter received from [facility] stating that they have approved the pts [place] PA. Approval good until 03/06/2019.   I have notified the pts pharmacy.

## 2017-12-15 ENCOUNTER — Encounter (HOSPITAL_COMMUNITY): Payer: Self-pay | Admitting: Cardiovascular Disease

## 2017-12-17 NOTE — Anesthesia Postprocedure Evaluation (Addendum)
Anesthesia Post Note  Patient: Ruben Murray  Procedure(s) Performed: CARDIOVERSION (N/A )     Patient location during evaluation: Endoscopy Anesthesia Type: General Level of consciousness: awake Pain management: pain level controlled Vital Signs Assessment: post-procedure vital signs reviewed and stable Respiratory status: spontaneous breathing Cardiovascular status: stable Postop Assessment: no apparent nausea or vomiting Anesthetic complications: no    Last Vitals:  Vitals:   12/13/17 1217 12/13/17 1220  BP: 123/62 (!) 145/88  Pulse: (!) 55 73  Resp: 13 10  Temp:    SpO2: 98% 98%    Last Pain:  Vitals:   12/14/17 1447  TempSrc:   PainSc: 0-No pain   Pain Goal:                 Velia Pamer JR,JOHN Kaydin Karbowski

## 2017-12-18 ENCOUNTER — Telehealth: Payer: Self-pay | Admitting: Cardiology

## 2017-12-18 DIAGNOSIS — I110 Hypertensive heart disease with heart failure: Secondary | ICD-10-CM | POA: Diagnosis not present

## 2017-12-18 DIAGNOSIS — J4540 Moderate persistent asthma, uncomplicated: Secondary | ICD-10-CM | POA: Diagnosis not present

## 2017-12-18 DIAGNOSIS — I808 Phlebitis and thrombophlebitis of other sites: Secondary | ICD-10-CM | POA: Diagnosis not present

## 2017-12-18 DIAGNOSIS — I2510 Atherosclerotic heart disease of native coronary artery without angina pectoris: Secondary | ICD-10-CM | POA: Diagnosis not present

## 2017-12-18 DIAGNOSIS — J449 Chronic obstructive pulmonary disease, unspecified: Secondary | ICD-10-CM | POA: Diagnosis not present

## 2017-12-18 DIAGNOSIS — L03113 Cellulitis of right upper limb: Secondary | ICD-10-CM | POA: Diagnosis not present

## 2017-12-18 DIAGNOSIS — I420 Dilated cardiomyopathy: Secondary | ICD-10-CM | POA: Diagnosis not present

## 2017-12-18 DIAGNOSIS — I480 Paroxysmal atrial fibrillation: Secondary | ICD-10-CM | POA: Diagnosis not present

## 2017-12-18 DIAGNOSIS — I5043 Acute on chronic combined systolic (congestive) and diastolic (congestive) heart failure: Secondary | ICD-10-CM | POA: Diagnosis not present

## 2017-12-18 MED ORDER — SACUBITRIL-VALSARTAN 24-26 MG PO TABS: 1.0000 | ORAL_TABLET | Freq: Two times a day (BID) | ORAL | 11 refills | Status: DC

## 2017-12-18 NOTE — Telephone Encounter (Addendum)
**Note De-Identified Aurelie Dicenzo Obfuscation** The pts wife is advised that the pts Entresto PA was approved for coverage through [facility] on 12/14/17 and that I have notified his pharmacy, [facility].  She states that [facility] told them yesterday that [name] will cost the pt $600 for a 30 day supply. I advised the pts wife that I would call [employer] to verify that they received the approval letter that was faxed to them on 10/11 as I received a confirmation that the fax was successful.   I s/w the pharmacist at [facility] who states that they did receive the approval letter and that the pts [name] is going through for $45 for a 30 day supply which is the normal cost after insurance pays their part.  I called the pts wife back and made her aware. She states that they cannot afford $45 dollars a month at this time.  I offered to leave an Entresto free 30 day card and an [employer] pt asst application in the front office for them to pick up. I also advised her that we no longer receive samples of Entresto as the manufacturer no longer provides them to [place].  She agreed with this plan and is coming to the office to pick up the free 30 day card (she states the pt was never given a free 30 day card and is aware that he can only use 1 free card in his lifetime) and the [facility] application.  Also, I advised her that I will send the pt in a new Entresto RX to [place] on [address] with the BIN#, PCN#, and GRP# from the frss 30 day card on the RX.  She is also aware that they can purchase the pts Entresto by the pill as well if that helps until we can get the pt pt asst through [facility] and if not the pt will not be able to afford Entresto and at that time I will discuss with Dr [name] about a more affordable medication.  She verbalized understanding to all of the above and thanked me for helping them.

## 2017-12-18 NOTE — Telephone Encounter (Signed)
New Message    Pt c/o medication issue:  1. Name of Medication: Entresto  2. How are you currently taking this medication (dosage and times per day)?   3. Are you having a reaction (difficulty breathing--STAT)?   4. What is your medication issue? Patients wife is calling on his behalf because the cost of the [name] is too expensive. Please call to discuss.

## 2017-12-19 ENCOUNTER — Other Ambulatory Visit: Payer: Self-pay | Admitting: *Deleted

## 2017-12-19 DIAGNOSIS — I4891 Unspecified atrial fibrillation: Secondary | ICD-10-CM | POA: Diagnosis not present

## 2017-12-19 DIAGNOSIS — Z7901 Long term (current) use of anticoagulants: Secondary | ICD-10-CM | POA: Diagnosis not present

## 2017-12-19 NOTE — Patient Outreach (Signed)
[facility]) Care Management  12/19/2017  LOYS Murray 21-Oct-1945 [id number]   Notified by care management assistant that member triggered red on EMMI heart failure dashboard for new problems and swelling.  Member already scheduled for routine home visit tomorrow, will follow up at that time.  [name], [place], MSN [place] [phone]

## 2017-12-19 NOTE — Telephone Encounter (Signed)
**Note De-Identified Khalif Stender Obfuscation** The pt and his wife returned his Nurse, adult. Dr [name] has signed the application and I faxed the application to [facility].

## 2017-12-20 ENCOUNTER — Other Ambulatory Visit: Payer: Self-pay | Admitting: *Deleted

## 2017-12-20 DIAGNOSIS — I480 Paroxysmal atrial fibrillation: Secondary | ICD-10-CM | POA: Diagnosis not present

## 2017-12-20 DIAGNOSIS — I110 Hypertensive heart disease with heart failure: Secondary | ICD-10-CM | POA: Diagnosis not present

## 2017-12-20 DIAGNOSIS — J4540 Moderate persistent asthma, uncomplicated: Secondary | ICD-10-CM | POA: Diagnosis not present

## 2017-12-20 DIAGNOSIS — I2510 Atherosclerotic heart disease of native coronary artery without angina pectoris: Secondary | ICD-10-CM | POA: Diagnosis not present

## 2017-12-20 DIAGNOSIS — I5043 Acute on chronic combined systolic (congestive) and diastolic (congestive) heart failure: Secondary | ICD-10-CM | POA: Diagnosis not present

## 2017-12-20 DIAGNOSIS — L03113 Cellulitis of right upper limb: Secondary | ICD-10-CM | POA: Diagnosis not present

## 2017-12-20 DIAGNOSIS — I808 Phlebitis and thrombophlebitis of other sites: Secondary | ICD-10-CM | POA: Diagnosis not present

## 2017-12-20 DIAGNOSIS — J449 Chronic obstructive pulmonary disease, unspecified: Secondary | ICD-10-CM | POA: Diagnosis not present

## 2017-12-20 DIAGNOSIS — I420 Dilated cardiomyopathy: Secondary | ICD-10-CM | POA: Diagnosis not present

## 2017-12-20 NOTE — Patient Outreach (Signed)
[facility]) Care Management   12/20/2017  KARELL TUKES 10/13/1945 [id number]  Ruben Murray is an 72 y.o. male  Subjective:   Member alert and oriented x3, denies shortness of breath or chest discomfort.  Wife report compliance with medications.  Cardioversion complete on 10/10, follow up with cardiology on 10/24.  Wife report weight has been stable, down from 270 pounds to 267 pounds.    Objective:   Review of Systems  Constitutional: Negative.   HENT: Negative.   Eyes: Negative.   Respiratory: Negative.   Cardiovascular: Negative.   Gastrointestinal: Negative.   Genitourinary: Negative.   Musculoskeletal: Negative.   Skin: Negative.   Neurological: Negative.   Endo/Heme/Allergies: Negative.   Psychiatric/Behavioral: Negative.     Physical Exam  Constitutional: He is oriented to person, place, and time. He appears well-developed and well-nourished.  Neck: Normal range of motion.  Cardiovascular: Normal rate.  Respiratory: Effort normal and breath sounds normal.  GI: Soft. Bowel sounds are normal.  Musculoskeletal: Normal range of motion.  Neurological: He is alert and oriented to person, place, and time.  Skin: Skin is warm and dry.   BP 134/72   Pulse 66   Resp 20   Wt 267 lb (121.1 kg)   SpO2 96%   BMI 40.60 kg/m   Encounter Medications:   Outpatient Encounter Medications as of 12/20/2017  Medication Sig  . acetaminophen (TYLENOL) 500 MG tablet Take 500-1,000 mg by mouth at bedtime as needed for moderate pain.   . Carboxymethylcellul-Glycerin (LUBRICATING EYE DROPS OP) Place 1 drop into both eyes daily as needed (dry eyes).  Marland Kitchen digoxin (LANOXIN) 0.25 MG tablet Take 1 tablet (0.25 mg total) by mouth daily.  . famotidine (PEPCID) 20 MG tablet One at bedtime (Patient taking differently: Take 20 mg by mouth at bedtime. One at bedtime)  . fluticasone (FLONASE) 50 MCG/ACT nasal spray Place 2 sprays into both nostrils 2 (two) times daily.  .  furosemide (LASIX) 40 MG tablet Take 1 tablet (40 mg total) by mouth daily.  . metoprolol tartrate (LOPRESSOR) 100 MG tablet Take 1 tablet (100 mg total) by mouth 2 (two) times daily.  Marland Kitchen PHENobarbital (LUMINAL) 97.2 MG tablet Take 97.2 mg by mouth every morning.  . polyethylene glycol (MIRALAX / GLYCOLAX) packet Take 17 g by mouth daily as needed. (Patient taking differently: Take 17 g by mouth daily as needed for moderate constipation. )  . potassium chloride SA (K-DUR,KLOR-CON) 20 MEQ tablet Take 1 tablet (20 mEq total) by mouth daily.  . pravastatin (PRAVACHOL) 40 MG tablet Take 40 mg by mouth at bedtime.   . sacubitril-valsartan (ENTRESTO) 24-26 MG Take 1 tablet by mouth 2 (two) times daily.  . tamsulosin (FLOMAX) 0.4 MG CAPS capsule Take 0.4 mg by mouth daily after breakfast.  . triamcinolone cream (KENALOG) 0.1 % Apply 1 application topically as needed (rash).  . warfarin (COUMADIN) 4 MG tablet Take 4 mg by mouth daily.    No facility-administered encounter medications on file as of 12/20/2017.     Functional Status:   In your present state of health, do you have any difficulty performing the following activities: 11/15/2017 10/29/2017  Hearing? N N  Vision? N N  Difficulty concentrating or making decisions? N N  Walking or climbing stairs? Y Y  Dressing or bathing? N N  Doing errands, shopping? [name]  Preparing Food and eating ? N -  Using the Toilet? N -  In the past six  months, have you accidently leaked urine? Y -  Do you have problems with loss of bowel control? N -  Managing your Medications? Y -  Managing your Finances? Y -  Housekeeping or managing your Housekeeping? Y -  Some recent data might be hidden    Fall/Depression Screening:    Fall Risk  11/15/2017  Falls in the past year? No   PHQ 2/9 Scores 11/15/2017  PHQ - 2 Score 0    Assessment:    Met with member at scheduled time.  Home health PT also present for session.  Pantry reviewed with wife, foods with high  sodium pointed out. They visit a church in the community for food assistance monthly and are given multiple canned goods.  Advised to rinse once open prior to cooking.  Wife also has list of foods and sodium content that was provided by a friend.    Member and wife deny any urgent concern, advised to contact this care manager with questions.  Plan:   Will follow up within the next month, if remain stable, will transition to health coach.  [facility] CM Care Plan Problem One     Most Recent Value  Care Plan Problem One  Risk for readmission related to A fib/heart failure as evidenced by recent hospitalization  Role Documenting the Problem One  Care Management [place] for Problem One  Active  [facility] Long Term Goal   Member will not be readmitted to hospital within the next 31 days  [facility] Long Term Goal Start Date  11/06/17  [facility] Long Term Goal Met Date  12/20/17  [facility] CM Short Term Goal #1   Member will have follow up appointment with cardiologist within the next 2 weeks  [facility] CM Short Term Goal #1 Start Date  11/06/17  [facility] CM Short Term Goal #1 Met Date  12/20/17  [facility] CM Short Term Goal #2   Member will take and record weights daily within the next 4 weeks  [facility] CM Short Term Goal #2 Start Date  11/06/17  [facility] CM Short Term Goal #2 Met Date  12/20/17     [name], RN, MSN [place] [phone]

## 2017-12-21 DIAGNOSIS — I420 Dilated cardiomyopathy: Secondary | ICD-10-CM | POA: Diagnosis not present

## 2017-12-21 DIAGNOSIS — I110 Hypertensive heart disease with heart failure: Secondary | ICD-10-CM | POA: Diagnosis not present

## 2017-12-21 DIAGNOSIS — L03113 Cellulitis of right upper limb: Secondary | ICD-10-CM | POA: Diagnosis not present

## 2017-12-21 DIAGNOSIS — J4540 Moderate persistent asthma, uncomplicated: Secondary | ICD-10-CM | POA: Diagnosis not present

## 2017-12-21 DIAGNOSIS — I2510 Atherosclerotic heart disease of native coronary artery without angina pectoris: Secondary | ICD-10-CM | POA: Diagnosis not present

## 2017-12-21 DIAGNOSIS — J449 Chronic obstructive pulmonary disease, unspecified: Secondary | ICD-10-CM | POA: Diagnosis not present

## 2017-12-21 DIAGNOSIS — I808 Phlebitis and thrombophlebitis of other sites: Secondary | ICD-10-CM | POA: Diagnosis not present

## 2017-12-21 DIAGNOSIS — I480 Paroxysmal atrial fibrillation: Secondary | ICD-10-CM | POA: Diagnosis not present

## 2017-12-21 DIAGNOSIS — I5043 Acute on chronic combined systolic (congestive) and diastolic (congestive) heart failure: Secondary | ICD-10-CM | POA: Diagnosis not present

## 2017-12-24 DIAGNOSIS — I2510 Atherosclerotic heart disease of native coronary artery without angina pectoris: Secondary | ICD-10-CM | POA: Diagnosis not present

## 2017-12-24 DIAGNOSIS — I808 Phlebitis and thrombophlebitis of other sites: Secondary | ICD-10-CM | POA: Diagnosis not present

## 2017-12-24 DIAGNOSIS — I5043 Acute on chronic combined systolic (congestive) and diastolic (congestive) heart failure: Secondary | ICD-10-CM | POA: Diagnosis not present

## 2017-12-24 DIAGNOSIS — J449 Chronic obstructive pulmonary disease, unspecified: Secondary | ICD-10-CM | POA: Diagnosis not present

## 2017-12-24 DIAGNOSIS — I110 Hypertensive heart disease with heart failure: Secondary | ICD-10-CM | POA: Diagnosis not present

## 2017-12-24 DIAGNOSIS — I420 Dilated cardiomyopathy: Secondary | ICD-10-CM | POA: Diagnosis not present

## 2017-12-24 DIAGNOSIS — L03113 Cellulitis of right upper limb: Secondary | ICD-10-CM | POA: Diagnosis not present

## 2017-12-24 DIAGNOSIS — J4540 Moderate persistent asthma, uncomplicated: Secondary | ICD-10-CM | POA: Diagnosis not present

## 2017-12-24 DIAGNOSIS — I480 Paroxysmal atrial fibrillation: Secondary | ICD-10-CM | POA: Diagnosis not present

## 2017-12-24 NOTE — Telephone Encounter (Signed)
**Note De-Identified [name]** Letter received Ruben Murray fax from [facility] central stating that they have determined that a PA was approved through the pts insurance company on 12/14/17 and is good until 03/06/2019 so this request is denied.  The letter also states that they are referring the pt to [place] for financial assistance and that [employer] will contact the pt.

## 2017-12-25 DIAGNOSIS — I2510 Atherosclerotic heart disease of native coronary artery without angina pectoris: Secondary | ICD-10-CM | POA: Diagnosis not present

## 2017-12-25 DIAGNOSIS — I808 Phlebitis and thrombophlebitis of other sites: Secondary | ICD-10-CM | POA: Diagnosis not present

## 2017-12-25 DIAGNOSIS — I110 Hypertensive heart disease with heart failure: Secondary | ICD-10-CM | POA: Diagnosis not present

## 2017-12-25 DIAGNOSIS — I5043 Acute on chronic combined systolic (congestive) and diastolic (congestive) heart failure: Secondary | ICD-10-CM | POA: Diagnosis not present

## 2017-12-25 DIAGNOSIS — I480 Paroxysmal atrial fibrillation: Secondary | ICD-10-CM | POA: Diagnosis not present

## 2017-12-25 DIAGNOSIS — L03113 Cellulitis of right upper limb: Secondary | ICD-10-CM | POA: Diagnosis not present

## 2017-12-25 DIAGNOSIS — J449 Chronic obstructive pulmonary disease, unspecified: Secondary | ICD-10-CM | POA: Diagnosis not present

## 2017-12-25 DIAGNOSIS — J4540 Moderate persistent asthma, uncomplicated: Secondary | ICD-10-CM | POA: Diagnosis not present

## 2017-12-25 DIAGNOSIS — I420 Dilated cardiomyopathy: Secondary | ICD-10-CM | POA: Diagnosis not present

## 2017-12-26 DIAGNOSIS — Z7901 Long term (current) use of anticoagulants: Secondary | ICD-10-CM | POA: Diagnosis not present

## 2017-12-26 DIAGNOSIS — I4891 Unspecified atrial fibrillation: Secondary | ICD-10-CM | POA: Diagnosis not present

## 2017-12-27 ENCOUNTER — Encounter: Payer: Self-pay | Admitting: Cardiology

## 2017-12-27 ENCOUNTER — Ambulatory Visit (INDEPENDENT_AMBULATORY_CARE_PROVIDER_SITE_OTHER): Payer: Medicare HMO | Admitting: Cardiology

## 2017-12-27 VITALS — BP 140/78 | HR 57 | Ht 68.0 in | Wt 274.1 lb

## 2017-12-27 DIAGNOSIS — I480 Paroxysmal atrial fibrillation: Secondary | ICD-10-CM | POA: Diagnosis not present

## 2017-12-27 DIAGNOSIS — I10 Essential (primary) hypertension: Secondary | ICD-10-CM

## 2017-12-27 DIAGNOSIS — I420 Dilated cardiomyopathy: Secondary | ICD-10-CM | POA: Diagnosis not present

## 2017-12-27 DIAGNOSIS — I2510 Atherosclerotic heart disease of native coronary artery without angina pectoris: Secondary | ICD-10-CM | POA: Diagnosis not present

## 2017-12-27 LAB — BASIC METABOLIC PANEL
BUN / CREAT RATIO: 16 (ref 10–24)
BUN: 14 mg/dL (ref 8–27)
CO2: 26 mmol/L (ref 20–29)
CREATININE: 0.88 mg/dL (ref 0.76–1.27)
Calcium: 9 mg/dL (ref 8.6–10.2)
Chloride: 99 mmol/L (ref 96–106)
GFR calc Af Amer: 98 mL/min/{1.73_m2} (ref >59)
GFR, EST NON AFRICAN AMERICAN: 85 mL/min/{1.73_m2} (ref >59)
Glucose: 88 mg/dL (ref 65–99)
Potassium: 4.6 mmol/L (ref 3.5–5.2)
Sodium: 140 mmol/L (ref 134–144)

## 2017-12-27 LAB — MAGNESIUM: Magnesium: 2.1 mg/dL (ref 1.6–2.3)

## 2017-12-27 MED ORDER — SACUBITRIL-VALSARTAN 49-51 MG PO TABS: 1.0000 | ORAL_TABLET | Freq: Two times a day (BID) | ORAL | 3 refills | Status: DC

## 2017-12-27 NOTE — Progress Notes (Signed)
Cardiology Office Note   Date:  12/27/2017   ID:  Ruben Murray, DOB 11-20-1945, MRN [id number]  PCP:  [name], MD  Cardiologist:  Dr. [name]    Chief Complaint  Patient presents with  . Atrial Fibrillation    post DCCV      History of Present Illness: Ruben Murray is a 72 y.o. male who presents for hospitalization for cardioversion.  Post DCCV for a fib.  Cardioverted at 150 J. To SR  With PACs. INR was therapeutic for 3 weeks prior to procedure.  He has a histroy of systolic/diastolic CHF, PAF, HTN, HL , RBBB, seizures, PAD, morbid obesity whow as admitted with SOB and recurrent rapid atrial fib; not on DOAC secondary to interaction with phenobarbital rate control treated with increased beta-blockers and digoxin. Entresto added 2/18 EF 40-45% 8/27--echo EF 25%, LAD-moderate  CHADSVASC is 4 for CHF, HTN, age, vascular disease (aortic altherosclerosis). He is maintained on Coumadin as above  Today he is feeling well, no chest pain and no SOB.  He has more energy in SR.  His [name] has been appproved but still $45 per copay.  No bleeding and INR followed by PCP.  Past Medical History:  Diagnosis Date  . Aortic atherosclerosis ([place])   . CAP (community acquired pneumonia) 03/06/2016  . Cardiomyopathy ([place])    a. EF 40-45% in 04/2016, etiology not defined, managed medically.  . Chronic combined systolic and diastolic CHF (congestive heart failure) ([place])   . Hyperlipidemia   . Hypertension   . Morbid obesity ([place])   . Myocardial infarction [facility]) 1980-early 2000s X 2   "mild ones" (03/06/2016)  . OSA on CPAP    a. pt states was told by Dr. Maxwell Caul that he would not require CPAP as long as he continued to sleep in recliner which he does for his back.  . Paroxysmal atrial fibrillation ([place])   . Pneumonia    "when I was a kid" (03/06/2016)  . RBBB   . Seizures ([place])    "take RX daily" (03/06/2016)    Past Surgical History:  Procedure Laterality Date  .  CARDIOVERSION N/A 12/13/2017   Procedure: CARDIOVERSION;  Surgeon: [name], MD;  Location: [facility];  Service: Cardiovascular;  Laterality: N/A;  . CIRCUMCISION    . JOINT REPLACEMENT    . PERCUTANEOUS PINNING TOE FRACTURE Left    "big toe  . REPLACEMENT TOTAL KNEE Left   . TONSILLECTOMY AND ADENOIDECTOMY       Current Outpatient Medications  Medication Sig Dispense Refill  . acetaminophen (TYLENOL) 500 MG tablet Take 500-1,000 mg by mouth at bedtime as needed for moderate pain.     . Carboxymethylcellul-Glycerin (LUBRICATING EYE DROPS OP) Place 1 drop into both eyes daily as needed (dry eyes).    Marland Kitchen digoxin (LANOXIN) 0.25 MG tablet Take 1 tablet (0.25 mg total) by mouth daily. 30 tablet 11  . famotidine (PEPCID) 20 MG tablet One at bedtime 30 tablet 2  . fluticasone (FLONASE) 50 MCG/ACT nasal spray Place 2 sprays into both nostrils 2 (two) times daily.    . furosemide (LASIX) 40 MG tablet Take 1 tablet (40 mg total) by mouth daily. 30 tablet 11  . metoprolol tartrate (LOPRESSOR) 100 MG tablet Take 1 tablet (100 mg total) by mouth 2 (two) times daily. 60 tablet 11  . PHENobarbital (LUMINAL) 97.2 MG tablet Take 97.2 mg by mouth every morning.    . polyethylene glycol (MIRALAX / GLYCOLAX) packet Take  17 g by mouth daily as needed. 14 each 0  . potassium chloride SA (K-DUR,KLOR-CON) 20 MEQ tablet Take 1 tablet (20 mEq total) by mouth daily. 30 tablet 11  . pravastatin (PRAVACHOL) 40 MG tablet Take 40 mg by mouth at bedtime.     . sacubitril-valsartan (ENTRESTO) 24-26 MG Take 1 tablet by mouth 2 (two) times daily. 60 tablet 11  . tamsulosin (FLOMAX) 0.4 MG CAPS capsule Take 0.4 mg by mouth daily after breakfast.    . triamcinolone cream (KENALOG) 0.1 % Apply 1 application topically as needed (rash).    . warfarin (COUMADIN) 3 MG tablet Take 1 tablet by mouth every other day.    . warfarin (COUMADIN) 4 MG tablet Take 4 mg by mouth every other day.      No current  facility-administered medications for this visit.     Allergies:   Dilaudid [hydromorphone hcl] and Tegretol [carbamazepine]    Social History:  The patient  reports that he quit smoking about 19 years ago. His smoking use included cigarettes. He has a 144.00 pack-year smoking history. He has never used smokeless tobacco. He reports that he does not drink alcohol or use drugs.   Family History:  The patient's family history includes Hypertension in his mother; Other in his father.    ROS:  General:no colds or fevers, no weight changes Skin:no rashes or ulcers HEENT:no blurred vision, no congestion CV:see HPI PUL:see HPI GI:no diarrhea constipation or melena, no indigestion GU:no hematuria, no dysuria MS:no joint pain, no claudication Neuro:no syncope, no lightheadedness Endo:no diabetes, no thyroid disease  Wt Readings from Last 3 Encounters:  12/27/17 274 lb 1.9 oz (124.3 kg)  12/20/17 267 lb (121.1 kg)  12/13/17 270 lb (122.5 kg)     PHYSICAL EXAM: VS:  BP 140/78   Pulse (!) 57   Ht _0  (1.727 m)   Wt 274 lb 1.9 oz (124.3 kg)   SpO2 93%   BMI 41.68 kg/m  , BMI Body mass index is 41.68 kg/m. General:Pleasant affect, NAD Skin:Warm and dry, brisk capillary refill HEENT:normocephalic, sclera clear, mucus membranes moist Neck:supple, no JVD, no bruits  Heart:S1S2 RRR without murmur, gallup, rub or click Lungs:clear without rales, rhonchi, or wheezes NUU:VOZD, non tender, + BS, do not palpate liver spleen or masses Ext:no lower ext edema, 2+ pedal pulses, 2+ radial pulses Neuro:alert and oriented X 3, MAE, follows commands, + facial symmetry    EKG:  EKG is ordered today. The ekg ordered today demonstrates SB at 30 LAD, RBBB inf Q waves. No changes    Recent Labs: 10/29/2017: B Natriuretic Peptide 539.2 10/30/2017: ALT 19; TSH 1.469 11/02/2017: Magnesium 1.9 12/10/2017: BUN 14; Creatinine, Ser 0.85; Hemoglobin 15.6; Platelets 176; Potassium 4.5; Sodium 144     Lipid Panel    Component Value Date/Time   CHOL 130 11/02/2017 0425   TRIG 81 11/02/2017 0425   HDL 31 (L) 11/02/2017 0425   CHOLHDL 4.2 11/02/2017 0425   VLDL 16 11/02/2017 0425   LDLCALC 83 11/02/2017 0425       Other studies Reviewed: Additional studies/ records that were reviewed today include:.  Echo 8/27 Study Conclusions  - Left ventricle: The cavity size was mildly dilated. Wall thickness was normal. The estimated ejection fraction was 25%. Diffuse hypokinesis. The study was not technically sufficient to allow evaluation of LV diastolic dysfunction due to atrial fibrillation. - Aortic valve: There was no stenosis. There was trivial regurgitation. - Aorta: Mildly dilated aortic root.  Aortic root dimension: 37 mm (ED). - Mitral valve: There was trivial regurgitation. - Left atrium: The atrium was moderately dilated. - Right ventricle: The cavity size was normal. Systolic function was moderately reduced. - Right atrium: The atrium was mildly dilated. - Tricuspid valve: Peak RV-RA gradient (S): 31 mm Hg. - Pulmonary arteries: PA peak pressure: 34 mm Hg (S). - Inferior vena cava: The vessel was normal in size. The respirophasic diameter changes were in the normal range (>= 50%), consistent with normal central venous pressure. - Pericardium, extracardiac: A trivial pericardial effusion was identified.  Impressions:  - The patient was in atrial fibrillation. Mildly dilated LV with EF 25%, diffuse hypokinesis. Normal RV size with moderately decreased systolic function. No significant valvular abnormalities.   ASSESSMENT AND PLAN:  1.  Atrial fib , persistent, now maintaining  SR after DCCV. With HR slower will stop dig.  Check BMP  2. CM on Entresto , will increase dose to 49/51 and check BMP.  They can afford for now.  Will repeat echo in another month or so.  3. HTN controlled with higher dose of entresto.   4.  Chronic  systolic and diastolic HF.  euvolemic today  5.  CAD hx of remote MI.  Possible need for stress test.  Will follow with Dr. [name], no pain currently.        Current medicines are reviewed with the patient today.  The patient Has no concerns regarding medicines.  The following changes have been made:  See above Labs/ tests ordered today include:see above  Disposition:   FU:  see above  Signed, Cecilie Kicks, NP  12/27/2017 10:51 AM    [address], [place], [place], [place] Phone: [phone]; Fax: [phone]

## 2017-12-27 NOTE — Patient Instructions (Addendum)
Medication Instructions:  Your physician has recommended you make the following change in your medication:  1.  STOP the Digoxin 2.  INCREASE the Entresto to 49/51 taking 1 tablet twice a day  If you need a refill on your cardiac medications before your next appointment, please call your pharmacy.   Lab work: TODAY:  BMET & MAG  If you have labs (blood work) drawn today and your tests are completely normal, you will receive your results only by: Marland Kitchen MyChart Message (if you have MyChart) OR . A paper copy in the mail If you have any lab test that is abnormal or we need to change your treatment, we will call you to review the results.  Testing/Procedures: None ordered  Follow-Up: Your physician recommends that you schedule a follow-up appointment in: 2-3 [place], NP AND 2-3 MONTHS WITH DR. [name]  Any Other Special Instructions Will Be Listed Below (If Applicable).

## 2017-12-28 ENCOUNTER — Telehealth: Payer: Self-pay | Admitting: *Deleted

## 2017-12-28 DIAGNOSIS — I5023 Acute on chronic systolic (congestive) heart failure: Secondary | ICD-10-CM

## 2017-12-28 DIAGNOSIS — Z79899 Other long term (current) drug therapy: Secondary | ICD-10-CM

## 2017-12-28 DIAGNOSIS — I4891 Unspecified atrial fibrillation: Secondary | ICD-10-CM

## 2017-12-28 DIAGNOSIS — I10 Essential (primary) hypertension: Secondary | ICD-10-CM

## 2017-12-28 NOTE — Telephone Encounter (Signed)
-----  Message from Isaiah Serge, NP sent at 12/27/2017  5:36 PM EDT ----- Labs are normal recheck BMP in 1 week with increase of entresto.

## 2018-01-01 DIAGNOSIS — J4540 Moderate persistent asthma, uncomplicated: Secondary | ICD-10-CM | POA: Diagnosis not present

## 2018-01-01 DIAGNOSIS — I110 Hypertensive heart disease with heart failure: Secondary | ICD-10-CM | POA: Diagnosis not present

## 2018-01-01 DIAGNOSIS — J449 Chronic obstructive pulmonary disease, unspecified: Secondary | ICD-10-CM | POA: Diagnosis not present

## 2018-01-01 DIAGNOSIS — I2510 Atherosclerotic heart disease of native coronary artery without angina pectoris: Secondary | ICD-10-CM | POA: Diagnosis not present

## 2018-01-01 DIAGNOSIS — I5043 Acute on chronic combined systolic (congestive) and diastolic (congestive) heart failure: Secondary | ICD-10-CM | POA: Diagnosis not present

## 2018-01-01 DIAGNOSIS — I480 Paroxysmal atrial fibrillation: Secondary | ICD-10-CM | POA: Diagnosis not present

## 2018-01-01 DIAGNOSIS — I808 Phlebitis and thrombophlebitis of other sites: Secondary | ICD-10-CM | POA: Diagnosis not present

## 2018-01-01 DIAGNOSIS — L03113 Cellulitis of right upper limb: Secondary | ICD-10-CM | POA: Diagnosis not present

## 2018-01-01 DIAGNOSIS — I420 Dilated cardiomyopathy: Secondary | ICD-10-CM | POA: Diagnosis not present

## 2018-01-01 NOTE — Addendum Note (Signed)
Addended by: Gaetano Net on: 01/01/2018 08:00 AM   Modules accepted: Orders

## 2018-01-02 DIAGNOSIS — I4891 Unspecified atrial fibrillation: Secondary | ICD-10-CM | POA: Diagnosis not present

## 2018-01-02 DIAGNOSIS — Z7901 Long term (current) use of anticoagulants: Secondary | ICD-10-CM | POA: Diagnosis not present

## 2018-01-03 ENCOUNTER — Telehealth: Payer: Self-pay | Admitting: Cardiology

## 2018-01-03 DIAGNOSIS — I2510 Atherosclerotic heart disease of native coronary artery without angina pectoris: Secondary | ICD-10-CM | POA: Diagnosis not present

## 2018-01-03 DIAGNOSIS — L03113 Cellulitis of right upper limb: Secondary | ICD-10-CM | POA: Diagnosis not present

## 2018-01-03 DIAGNOSIS — I480 Paroxysmal atrial fibrillation: Secondary | ICD-10-CM | POA: Diagnosis not present

## 2018-01-03 DIAGNOSIS — I420 Dilated cardiomyopathy: Secondary | ICD-10-CM | POA: Diagnosis not present

## 2018-01-03 DIAGNOSIS — J4540 Moderate persistent asthma, uncomplicated: Secondary | ICD-10-CM | POA: Diagnosis not present

## 2018-01-03 DIAGNOSIS — J449 Chronic obstructive pulmonary disease, unspecified: Secondary | ICD-10-CM | POA: Diagnosis not present

## 2018-01-03 DIAGNOSIS — I5043 Acute on chronic combined systolic (congestive) and diastolic (congestive) heart failure: Secondary | ICD-10-CM | POA: Diagnosis not present

## 2018-01-03 DIAGNOSIS — I808 Phlebitis and thrombophlebitis of other sites: Secondary | ICD-10-CM | POA: Diagnosis not present

## 2018-01-03 DIAGNOSIS — I110 Hypertensive heart disease with heart failure: Secondary | ICD-10-CM | POA: Diagnosis not present

## 2018-01-03 MED ORDER — SACUBITRIL-VALSARTAN 49-51 MG PO TABS: 1.0000 | ORAL_TABLET | Freq: Two times a day (BID) | ORAL | 3 refills | Status: DC

## 2018-01-03 NOTE — Telephone Encounter (Signed)
Spoke with the patient she requested Entresto be sent to [facility] on [address]. She sent forms to [place] and she will try to use her assistance card and will call the office back if it is too expensive.

## 2018-01-03 NOTE — Telephone Encounter (Signed)
New Message   Pt c/o medication issue:  1. Name of Medication: sacubitril-valsartan (ENTRESTO) 49-51 MG  2. How are you currently taking this medication (dosage and times per day)? Take 1 tablet by mouth 2 (two) times daily  3. Are you having a reaction (difficulty breathing--STAT)? no  4. What is your medication issue? Pt's wife states that a prescription was sent over to [facility] but it was too expensive so she cancelled it and would like another sent to [facility] on [address]

## 2018-01-04 ENCOUNTER — Other Ambulatory Visit: Payer: Medicare HMO

## 2018-01-04 ENCOUNTER — Other Ambulatory Visit: Payer: 59

## 2018-01-04 DIAGNOSIS — I5023 Acute on chronic systolic (congestive) heart failure: Secondary | ICD-10-CM

## 2018-01-04 DIAGNOSIS — I10 Essential (primary) hypertension: Secondary | ICD-10-CM | POA: Diagnosis not present

## 2018-01-04 DIAGNOSIS — Z79899 Other long term (current) drug therapy: Secondary | ICD-10-CM | POA: Diagnosis not present

## 2018-01-04 DIAGNOSIS — I4891 Unspecified atrial fibrillation: Secondary | ICD-10-CM | POA: Diagnosis not present

## 2018-01-04 LAB — BASIC METABOLIC PANEL
BUN / CREAT RATIO: 15 (ref 10–24)
BUN: 15 mg/dL (ref 8–27)
CHLORIDE: 100 mmol/L (ref 96–106)
CO2: 26 mmol/L (ref 20–29)
Calcium: 8.9 mg/dL (ref 8.6–10.2)
Creatinine, Ser: 0.98 mg/dL (ref 0.76–1.27)
GFR calc non Af Amer: 76 mL/min/{1.73_m2} (ref >59)
GFR, EST AFRICAN AMERICAN: 87 mL/min/{1.73_m2} (ref >59)
Glucose: 99 mg/dL (ref 65–99)
POTASSIUM: 4.4 mmol/L (ref 3.5–5.2)
Sodium: 141 mmol/L (ref 134–144)

## 2018-01-04 NOTE — Telephone Encounter (Signed)
Jeani Hawking had already arranged, and it was $45 -ok will see what Jeani Hawking says

## 2018-01-07 NOTE — Telephone Encounter (Signed)
Spoke with the patient, she sent the forms into [employer] and is waiting to see if the copay can be lowered.

## 2018-01-08 ENCOUNTER — Telehealth: Payer: Self-pay | Admitting: Cardiology

## 2018-01-08 MED ORDER — SACUBITRIL-VALSARTAN 49-51 MG PO TABS: 1.0000 | ORAL_TABLET | Freq: Two times a day (BID) | ORAL | 3 refills | Status: DC

## 2018-01-08 NOTE — Telephone Encounter (Addendum)
**Note De-Identified Evva Din Obfuscation** The pts wife states that Tiffany at [employer] pt asst foundation needs a call from [place] to discuss a SCRA box that was not checked on the pts pt asst application. I am unaware of a SCRA box on the application so I called [employer] pt asst and s/w [place].  Per [employer] the [place] box is checked by the pt "ONLY" as it is pertaining to the patients finances. [employer] did say that they needed a ICD 10 code as the application does not ask for that info. I provided her the ICD 10 code of X45.03-UUEKCMK systolic and diastolic HF.  Also, since the pts Entresto dose has increased to 49-51 mg BID I have faxed a new Entresto RX to [facility] [employer] as recommended by [employer]. On the cover letter I wrote a message stating that the pts dose has increased and to please add new RX to the pts application.  Per [employer] Cloud I did call the pts wife back and advised her that [employer] thinks they have all they need for the pts application and that if they need anything else a [employer] representative will contact them.   The pts wife was very appreciative that I called [employer] and that I called her back with update. She verbalized understanding and again thank me for helping them.

## 2018-01-08 NOTE — Telephone Encounter (Signed)
**Note De-Identified Ruben Murray Obfuscation** I have attempted an Entresto tier exception through covermymeds. Key: [place]  The pts wife is aware.

## 2018-01-08 NOTE — Telephone Encounter (Signed)
New Message   Patients wife is calling in reference to assistance with the [facility]

## 2018-01-10 ENCOUNTER — Encounter: Payer: Self-pay | Admitting: Cardiology

## 2018-01-10 ENCOUNTER — Ambulatory Visit (INDEPENDENT_AMBULATORY_CARE_PROVIDER_SITE_OTHER): Payer: Medicare HMO | Admitting: Cardiology

## 2018-01-10 VITALS — BP 120/74 | HR 60 | Ht 68.0 in | Wt 271.0 lb

## 2018-01-10 DIAGNOSIS — I429 Cardiomyopathy, unspecified: Secondary | ICD-10-CM | POA: Diagnosis not present

## 2018-01-10 DIAGNOSIS — I4819 Other persistent atrial fibrillation: Secondary | ICD-10-CM

## 2018-01-10 DIAGNOSIS — I5042 Chronic combined systolic (congestive) and diastolic (congestive) heart failure: Secondary | ICD-10-CM | POA: Diagnosis not present

## 2018-01-10 DIAGNOSIS — I10 Essential (primary) hypertension: Secondary | ICD-10-CM | POA: Diagnosis not present

## 2018-01-10 NOTE — Progress Notes (Signed)
Cardiology Office Note   Date:  01/10/2018   ID:  Ruben Murray, DOB 1946-01-13, MRN [id number]  PCP:  [name], MD  Cardiologist:  Dr. [name]     Chief Complaint  Patient presents with  . Atrial Fibrillation      History of Present Illness: Ruben Murray is a 72 y.o. male who presents for cardiomyopathy and chronic HF.   Post DCCV for a fib.  Cardioverted with 150 J. To SR  With PACs. INR was therapeutic for 3 weeks prior to procedure.  He has ahistroy of systolic/diastolic CHF, PAF, HTN, HL ,RBBB,seizures, PAD, morbid obesity whow as admitted with SOB and recurrent rapid atrial fib; not on DOAC secondary to interaction with phenobarbital rate control treated with increased beta-blockers and digoxin. Entresto added 2/18 EF 40-45% 8/27--echo EF 25%, LAD-moderate  CHADSVASC is 4 for CHF, HTN, age, vascular disease (aortic altherosclerosis). He is maintained on Coumadin as above  Last visit he is feeling well, no chest pain and no SOB.  He has more energy in SR.  His [name] has been appproved but still $45 per copay.  No bleeding and INR followed by PCP.  I stopped his dig, and increased entresto, he may still have issues with cost.   Today he feels well some hoarseness to voice. No fevers, mild cough.  No chest pain and no SOB, mild chronic edema of lower ext.  No awareness of rapid HR.  His wt. Has been stable at home.   Past Medical History:  Diagnosis Date  . Aortic atherosclerosis ([place])   . CAP (community acquired pneumonia) 03/06/2016  . Cardiomyopathy ([place])    a. EF 40-45% in 04/2016, etiology not defined, managed medically.  . Chronic combined systolic and diastolic CHF (congestive heart failure) ([place])   . Hyperlipidemia   . Hypertension   . Morbid obesity ([place])   . Myocardial infarction [facility]) 1980-early 2000s X 2   "mild ones" (03/06/2016)  . OSA on CPAP    a. pt states was told by Dr. Maxwell Caul that he would not require CPAP as long as he continued  to sleep in recliner which he does for his back.  . Paroxysmal atrial fibrillation ([place])   . Pneumonia    "when I was a kid" (03/06/2016)  . RBBB   . Seizures ([place])    "take RX daily" (03/06/2016)    Past Surgical History:  Procedure Laterality Date  . CARDIOVERSION N/A 12/13/2017   Procedure: CARDIOVERSION;  Surgeon: [name], MD;  Location: [place];  Service: Cardiovascular;  Laterality: N/A;  . CIRCUMCISION    . JOINT REPLACEMENT    . PERCUTANEOUS PINNING TOE FRACTURE Left    "big toe  . REPLACEMENT TOTAL KNEE Left   . TONSILLECTOMY AND ADENOIDECTOMY       Current Outpatient Medications  Medication Sig Dispense Refill  . acetaminophen (TYLENOL) 500 MG tablet Take 500-1,000 mg by mouth at bedtime as needed for moderate pain.     . Carboxymethylcellul-Glycerin (LUBRICATING EYE DROPS OP) Place 1 drop into both eyes daily as needed (dry eyes).    . famotidine (PEPCID) 20 MG tablet One at bedtime 30 tablet 2  . fluticasone (FLONASE) 50 MCG/ACT nasal spray Place 2 sprays into both nostrils 2 (two) times daily.    . furosemide (LASIX) 40 MG tablet Take 1 tablet (40 mg total) by mouth daily. 30 tablet 11  . metoprolol tartrate (LOPRESSOR) 100 MG tablet Take 1 tablet (100 mg total)  by mouth 2 (two) times daily. 60 tablet 11  . PHENobarbital (LUMINAL) 97.2 MG tablet Take 97.2 mg by mouth every morning.    . polyethylene glycol (MIRALAX / GLYCOLAX) packet Take 17 g by mouth daily as needed. 14 each 0  . potassium chloride SA (K-DUR,KLOR-CON) 20 MEQ tablet Take 1 tablet (20 mEq total) by mouth daily. 30 tablet 11  . pravastatin (PRAVACHOL) 40 MG tablet Take 40 mg by mouth at bedtime.     . sacubitril-valsartan (ENTRESTO) 49-51 MG Take 1 tablet by mouth 2 (two) times daily. 180 tablet 3  . tamsulosin (FLOMAX) 0.4 MG CAPS capsule Take 0.4 mg by mouth daily after breakfast.    . triamcinolone cream (KENALOG) 0.1 % Apply 1 application topically as needed (rash).    . warfarin  (COUMADIN) 3 MG tablet Take 1 tablet by mouth every other day.    . warfarin (COUMADIN) 4 MG tablet Take 4 mg by mouth every other day.      No current facility-administered medications for this visit.     Allergies:   Dilaudid [hydromorphone hcl] and Tegretol [carbamazepine]    Social History:  The patient  reports that he quit smoking about 19 years ago. His smoking use included cigarettes. He has a 144.00 pack-year smoking history. He has never used smokeless tobacco. He reports that he does not drink alcohol or use drugs.   Family History:  The patient's family history includes Hypertension in his mother; Other in his father.    ROS:  General:no colds or fevers, no weight changes on his home scales, he does wear a boot on rt foot. Skin:no rashes or ulcers HEENT:no blurred vision, no congestion, + hoarseness  CV:see HPI PUL:see HPI GI:no diarrhea constipation or melena, no indigestion GU:no hematuria, no dysuria MS:no joint pain, no claudication Neuro:no syncope, no lightheadedness Endo:no diabetes, no thyroid disease  Wt Readings from Last 3 Encounters:  01/10/18 271 lb (122.9 kg)  12/27/17 274 lb 1.9 oz (124.3 kg)  12/20/17 267 lb (121.1 kg)     PHYSICAL EXAM: VS:  BP 120/74   Pulse 60   Ht _0  (1.727 m)   Wt 271 lb (122.9 kg)   SpO2 97%   BMI 41.21 kg/m  , BMI Body mass index is 41.21 kg/m. General:Pleasant affect, NAD Skin:Warm and dry, brisk capillary refill HEENT:normocephalic, sclera clear, mucus membranes moist Neck:supple, no JVD, no bruits  Heart:S1S2 RRR with some premature beats, without murmur, gallup, rub or click Lungs:clear without rales, rhonchi, or wheezes DGL:OVFI, non tender, + BS, do not palpate liver spleen or masses Ext:no lower ext edema, 2+ pedal pulses, 2+ radial pulses Neuro:alert and oriented X 3, MAE, follows commands, + facial symmetry    EKG:  EKG is ordered today. The ekg ordered today demonstrates SR with RBBB and PACs.     Recent Labs: 10/29/2017: B Natriuretic Peptide 539.2 10/30/2017: ALT 19; TSH 1.469 12/10/2017: Hemoglobin 15.6; Platelets 176 12/27/2017: Magnesium 2.1 01/04/2018: BUN 15; Creatinine, Ser 0.98; Potassium 4.4; Sodium 141    Lipid Panel    Component Value Date/Time   CHOL 130 11/02/2017 0425   TRIG 81 11/02/2017 0425   HDL 31 (L) 11/02/2017 0425   CHOLHDL 4.2 11/02/2017 0425   VLDL 16 11/02/2017 0425   LDLCALC 83 11/02/2017 0425       Other studies Reviewed: Additional studies/ records that were reviewed today include: .  Echo 8/27 Study Conclusions  - Left ventricle: The cavity size was mildly  dilated. Wall thickness was normal. The estimated ejection fraction was 25%. Diffuse hypokinesis. The study was not technically sufficient to allow evaluation of LV diastolic dysfunction due to atrial fibrillation. - Aortic valve: There was no stenosis. There was trivial regurgitation. - Aorta: Mildly dilated aortic root. Aortic root dimension: 37 mm (ED). - Mitral valve: There was trivial regurgitation. - Left atrium: The atrium was moderately dilated. - Right ventricle: The cavity size was normal. Systolic function was moderately reduced. - Right atrium: The atrium was mildly dilated. - Tricuspid valve: Peak RV-RA gradient (S): 31 mm Hg. - Pulmonary arteries: PA peak pressure: 34 mm Hg (S). - Inferior vena cava: The vessel was normal in size. The respirophasic diameter changes were in the normal range (>= 50%), consistent with normal central venous pressure. - Pericardium, extracardiac: A trivial pericardial effusion was identified.  Impressions:  - The patient was in atrial fibrillation. Mildly dilated LV with EF 25%, diffuse hypokinesis. Normal RV size with moderately decreased systolic function. No significant valvular abnormalities.  ASSESSMENT AND PLAN:  1.  Cardiomyopathy with EF 25%, no SOB, no chest pain.  Is tolerating the  entresto at 49/51 mg BID. It has been difficult to increase so will leave at this dose for now.  BP and labs stable.  Will repeat Echo prior to next visit with Dr. [name] in Jan.  If no change in EF may need nuc study or cath at that time.  2.  Persistent a fib, maintaining SR with PACs   3.  Anticoagulation on coumadin, followed by PCP, and stable.   4.  Chronic systolic and diastolic HF.  Mild lower ext edema, chronic. Wt at home is stable 261 -56 Lbs.   5.  CAD with hx of remote MI -  2003 with normal coronary arteries with cath.    Current medicines are reviewed with the patient today.  The patient Has no concerns regarding medicines.  The following changes have been made:  See above Labs/ tests ordered today include:see above  Disposition:   FU:  see above  Signed, Cecilie Kicks, NP  01/10/2018 12:02 PM    [address], [place], [place], [place] Phone: [phone]; Fax: [phone]

## 2018-01-10 NOTE — Patient Instructions (Addendum)
Medication Instructions:  Your physician recommends that you continue on your current medications as directed. Please refer to the Current Medication list given to you today.  If you need a refill on your cardiac medications before your next appointment, please call your pharmacy.   Lab work: NONE If you have labs (blood work) drawn today and your tests are completely normal, you will receive your results only by: Marland Kitchen MyChart Message (if you have MyChart) OR . A paper copy in the mail If you have any lab test that is abnormal or we need to change your treatment, we will call you to review the results.  Testing/Procedures: Your physician has requested that you have an echocardiogram. Echocardiography is a painless test that uses sound waves to create images of your heart. It provides your doctor with information about the size and shape of your heart and how well your heart's chambers and valves are working. This procedure takes approximately one hour. There are no restrictions for this procedure. TO BE DONE IN 2 MONTHS    Follow-Up: . PLEASE KEEP APPOINTMENT WITH DR. [name] ON January 24TH @ 10:20 AM.  Any Other Special Instructions Will Be Listed Below (If Applicable).

## 2018-01-11 NOTE — Telephone Encounter (Signed)
**Note De-Identified [name]** Letter received Ruben Murray fax from [facility] stating that they have denied the tier exception for [facility]. Reason: This brand name drug is covered at the cost sharing (co-pay) tier listed in your current drug guide. Medicare changed the tier exception rules starting in 2019. Since there isnt a brand name drug for your condition in a lower cost sharing tier, a tier exception is not permitted

## 2018-01-16 DIAGNOSIS — Z7901 Long term (current) use of anticoagulants: Secondary | ICD-10-CM | POA: Diagnosis not present

## 2018-01-16 DIAGNOSIS — I4891 Unspecified atrial fibrillation: Secondary | ICD-10-CM | POA: Diagnosis not present

## 2018-01-18 ENCOUNTER — Other Ambulatory Visit: Payer: Self-pay | Admitting: *Deleted

## 2018-01-18 NOTE — Patient Outreach (Signed)
[place] [facility]) Care Management  01/18/2018  Ruben Murray [birth year] [id number]   Call placed to member to follow up on current health status.  He report he is doing well, denies any shortness of breath, weight remains stable between 261-262 pounds.  Denies any increase in swelling of legs/feet.  Has chronic swelling in right ankle due to ortho issue.  He is still working on managing his low sodium diet, wife is helping with that and monitoring fluid intakes.  This care manager discussed transition to health coach as he remains stable, he agrees.  Will notify primary MD of referral/transition to health coach.  Ruben Murray, [place], MSN [place] [phone]

## 2018-01-25 ENCOUNTER — Encounter: Payer: Self-pay | Admitting: *Deleted

## 2018-02-14 DIAGNOSIS — I4891 Unspecified atrial fibrillation: Secondary | ICD-10-CM | POA: Diagnosis not present

## 2018-02-14 DIAGNOSIS — Z7901 Long term (current) use of anticoagulants: Secondary | ICD-10-CM | POA: Diagnosis not present

## 2018-02-18 ENCOUNTER — Encounter: Payer: Self-pay | Admitting: *Deleted

## 2018-02-18 ENCOUNTER — Other Ambulatory Visit: Payer: Self-pay | Admitting: *Deleted

## 2018-02-18 NOTE — Patient Outreach (Signed)
[place] [facility]) Care Management  02/18/2018  Ruben Murray [birth year] [id number]   RN Health Coach Initial Assessment  Referral Date:  01/18/2018 Referral Source:  Transfer from [place] Reason for Referral:  Continued Disease Management Education Insurance:  [facility] Medicare   Outreach Attempt:  Outreach attempt #1 to patient for introduction and initial telephone assessment.  HIPAA verified with patient.  RN Health Coach introduced self and role.  Patient verbally agrees to Disease Management outreaches.  Verbalizes he has been at the nursing home around the clock with his terminally ill mother who the do not expect to live much longer.  States he and his wife is on the way back to the facility to spend time with his mother.  Initial telephone assessment initiated but when patient arrived at facility, [place] encouraged patient and wife to spend time with patient's mother and telephone assessment will be completed at another time.  Plan:  RN Health Coach will contact patient after holidays to complete initial telephone assessment.   [place] [phone] [email or web address]

## 2018-02-26 DIAGNOSIS — I4891 Unspecified atrial fibrillation: Secondary | ICD-10-CM | POA: Diagnosis not present

## 2018-02-26 DIAGNOSIS — Z7901 Long term (current) use of anticoagulants: Secondary | ICD-10-CM | POA: Diagnosis not present

## 2018-03-05 DIAGNOSIS — I4891 Unspecified atrial fibrillation: Secondary | ICD-10-CM | POA: Diagnosis not present

## 2018-03-05 DIAGNOSIS — Z7901 Long term (current) use of anticoagulants: Secondary | ICD-10-CM | POA: Diagnosis not present

## 2018-03-12 DIAGNOSIS — I420 Dilated cardiomyopathy: Secondary | ICD-10-CM | POA: Diagnosis not present

## 2018-03-12 DIAGNOSIS — S39012A Strain of muscle, fascia and tendon of lower back, initial encounter: Secondary | ICD-10-CM | POA: Diagnosis not present

## 2018-03-12 DIAGNOSIS — I4891 Unspecified atrial fibrillation: Secondary | ICD-10-CM | POA: Diagnosis not present

## 2018-03-12 DIAGNOSIS — Z7901 Long term (current) use of anticoagulants: Secondary | ICD-10-CM | POA: Diagnosis not present

## 2018-03-18 ENCOUNTER — Ambulatory Visit (HOSPITAL_COMMUNITY): Payer: Medicare HMO | Attending: Cardiovascular Disease

## 2018-03-18 ENCOUNTER — Other Ambulatory Visit: Payer: Self-pay

## 2018-03-18 DIAGNOSIS — I429 Cardiomyopathy, unspecified: Secondary | ICD-10-CM | POA: Insufficient documentation

## 2018-03-19 ENCOUNTER — Other Ambulatory Visit: Payer: Self-pay | Admitting: *Deleted

## 2018-03-19 DIAGNOSIS — Z7901 Long term (current) use of anticoagulants: Secondary | ICD-10-CM | POA: Diagnosis not present

## 2018-03-19 DIAGNOSIS — I4891 Unspecified atrial fibrillation: Secondary | ICD-10-CM | POA: Diagnosis not present

## 2018-03-19 NOTE — Patient Outreach (Signed)
[place] [facility]) Care Management  03/19/2018  Ruben Murray [birth year] [id number]   RN Health Coach Initial Assessment  Referral Date:  01/18/2018 Referral Source:  Transfer from [place] Reason for Referral:  Continued Disease Management Education Insurance:  [facility] Medicare   Outreach Attempt:  Outreach attempt #2 to patient for initial telephone assessment. No answer. RN Health Coach left HIPAA compliant voicemail message along with contact information.  Plan:  RN Health Coach will make another outreach attempt within the month of February.  [place] [phone] [email or web address]

## 2018-03-29 ENCOUNTER — Encounter: Payer: Self-pay | Admitting: Cardiology

## 2018-03-29 ENCOUNTER — Ambulatory Visit: Payer: Medicare HMO | Admitting: Cardiology

## 2018-03-29 ENCOUNTER — Encounter (INDEPENDENT_AMBULATORY_CARE_PROVIDER_SITE_OTHER): Payer: Self-pay

## 2018-03-29 VITALS — BP 114/70 | HR 66 | Ht 68.0 in | Wt 271.1 lb

## 2018-03-29 DIAGNOSIS — I10 Essential (primary) hypertension: Secondary | ICD-10-CM | POA: Diagnosis not present

## 2018-03-29 DIAGNOSIS — I480 Paroxysmal atrial fibrillation: Secondary | ICD-10-CM

## 2018-03-29 DIAGNOSIS — I2510 Atherosclerotic heart disease of native coronary artery without angina pectoris: Secondary | ICD-10-CM

## 2018-03-29 DIAGNOSIS — I420 Dilated cardiomyopathy: Secondary | ICD-10-CM | POA: Diagnosis not present

## 2018-03-29 DIAGNOSIS — I5042 Chronic combined systolic (congestive) and diastolic (congestive) heart failure: Secondary | ICD-10-CM | POA: Diagnosis not present

## 2018-03-29 NOTE — Patient Instructions (Signed)
Medication Instructions:  The current medical regimen is effective;  continue present plan and medications.  If you need a refill on your cardiac medications before your next appointment, please call your pharmacy.   Follow-Up: At [facility], you and your health needs are our priority.  As part of our continuing mission to provide you with exceptional heart care, we have created designated Provider Care Teams.  These Care Teams include your primary Cardiologist (physician) and Advanced Practice Providers (APPs -  Physician Assistants and Nurse Practitioners) who all work together to provide you with the care you need, when you need it. You will need a follow up appointment in 6 months with Cecilie Kicks, NP and 1 year with Dr [name].  Please call our office 2 months in advance to schedule this appointment.  You may see [name], MD or one of the following Advanced Practice Providers on your designated Care Team:   Truitt Merle, NP Cecilie Kicks, NP . Kathyrn Drown, NP  Thank you for choosing [facility]!!

## 2018-03-29 NOTE — Progress Notes (Signed)
Cardiology Office Note:    Date:  03/29/2018   ID:  Ruben Murray, DOB [birth year], MRN [id number]  PCP:  [name], MD  Cardiologist:  [name], MD  Electrophysiologist:  None   Referring MD: [name], MD     History of Present Illness:    Ruben Murray is a 73 y.o. male here for the follow-up of cardiomyopathy chronic heart failure atrial fibrillation.  Had cardioversion performed in October 2019.  Sinus rhythm.  On Coumadin.  No recent changes.  Digoxin was stopped in the past.  [name] was increased to moderate dose.  EF 40% in February 2018.  Prior EF 25%.  Most recent echocardiogram shows EF of about 45%.  Improved with Entresto.  Continue to take.  Other than back pain, back spasm, he is doing quite well.  No significant shortness of breath fevers chills nausea vomiting syncope.  Past Medical History:  Diagnosis Date  . Aortic atherosclerosis ([place])   . CAP (community acquired pneumonia) 03/06/2016  . Cardiomyopathy ([place])    a. EF 40-45% in 04/2016, etiology not defined, managed medically.  . Chronic combined systolic and diastolic CHF (congestive heart failure) ([place])   . Hyperlipidemia   . Hypertension   . Morbid obesity ([place])   . Myocardial infarction [facility]) 1980-early 2000s X 2   "mild ones" (03/06/2016)  . OSA on CPAP    a. pt states was told by Dr. Maxwell Caul that he would not require CPAP as long as he continued to sleep in recliner which he does for his back.  . Paroxysmal atrial fibrillation ([place])   . Pneumonia    "when I was a kid" (03/06/2016)  . RBBB   . Seizures ([place])    "take RX daily" (03/06/2016)    Past Surgical History:  Procedure Laterality Date  . CARDIOVERSION N/A 12/13/2017   Procedure: CARDIOVERSION;  Surgeon: [name], MD;  Location: [place];  Service: Cardiovascular;  Laterality: N/A;  . CIRCUMCISION    . JOINT REPLACEMENT    . PERCUTANEOUS PINNING TOE FRACTURE Left    "big toe  . REPLACEMENT TOTAL KNEE Left   .  TONSILLECTOMY AND ADENOIDECTOMY      Current Medications: Current Meds  Medication Sig  . acetaminophen (TYLENOL) 500 MG tablet Take 500-1,000 mg by mouth at bedtime as needed for moderate pain.   . Carboxymethylcellul-Glycerin (LUBRICATING EYE DROPS OP) Place 1 drop into both eyes daily as needed (dry eyes).  . famotidine (PEPCID) 20 MG tablet One at bedtime  . fluticasone (FLONASE) 50 MCG/ACT nasal spray Place 2 sprays into both nostrils 2 (two) times daily.  . furosemide (LASIX) 40 MG tablet Take 1 tablet (40 mg total) by mouth daily.  . metoprolol tartrate (LOPRESSOR) 100 MG tablet Take 1 tablet (100 mg total) by mouth 2 (two) times daily.  . pantoprazole (PROTONIX) 40 MG tablet Take 40 mg by mouth daily.  Marland Kitchen PHENobarbital (LUMINAL) 97.2 MG tablet Take 97.2 mg by mouth every morning.  . polyethylene glycol (MIRALAX / GLYCOLAX) packet Take 17 g by mouth daily as needed.  . potassium chloride SA (K-DUR,KLOR-CON) 20 MEQ tablet Take 1 tablet (20 mEq total) by mouth daily.  . pravastatin (PRAVACHOL) 40 MG tablet Take 40 mg by mouth at bedtime.   . sacubitril-valsartan (ENTRESTO) 49-51 MG Take 1 tablet by mouth 2 (two) times daily.  Marland Kitchen tiZANidine (ZANAFLEX) 4 MG tablet Take 4 mg by mouth daily.  Marland Kitchen triamcinolone cream (KENALOG) 0.1 % Apply 1  application topically as needed (rash).  . warfarin (COUMADIN) 3 MG tablet Take 1 tablet by mouth every other day.  . warfarin (COUMADIN) 4 MG tablet Take 4 mg by mouth every other day.      Allergies:   Dilaudid [hydromorphone hcl] and Tegretol [carbamazepine]   Social History   Socioeconomic History  . Marital status: Married    Spouse name: Not on file  . Number of children: Not on file  . Years of education: Not on file  . Highest education level: Not on file  Occupational History  . Not on file  Social Needs  . Financial resource strain: Not on file  . Food insecurity:    Worry: Not on file    Inability: Not on file  . Transportation  needs:    Medical: Not on file    Non-medical: Not on file  Tobacco Use  . Smoking status: Former Smoker    Packs/day: 3.00    Years: 48.00    Pack years: 144.00    Types: Cigarettes    Last attempt to quit: 2000    Years since quitting: 20.0  . Smokeless tobacco: Never Used  Substance and Sexual Activity  . Alcohol use: No  . Drug use: No  . Sexual activity: Never  Lifestyle  . Physical activity:    Days per week: Not on file    Minutes per session: Not on file  . Stress: Not on file  Relationships  . Social connections:    Talks on phone: Not on file    Gets together: Not on file    Attends religious service: Not on file    Active member of club or organization: Not on file    Attends meetings of clubs or organizations: Not on file    Relationship status: Not on file  Other Topics Concern  . Not on file  Social History Narrative  . Not on file     Family History: The patient's family history includes Hypertension in his mother; Other in his father.  ROS:   Please see the history of present illness.    Denies any bleeding fevers chills nausea vomiting syncope all other systems reviewed and are negative.  EKGs/Labs/Other Studies Reviewed:    The following studies were reviewed today: Hospital records EKG lab work echo  EKG: Last one 01/10/2018 showed normal sinus rhythm with PAC reviewed and interpreted.  Recent Labs: 10/29/2017: B Natriuretic Peptide 539.2 10/30/2017: ALT 19; TSH 1.469 12/10/2017: Hemoglobin 15.6; Platelets 176 12/27/2017: Magnesium 2.1 01/04/2018: BUN 15; Creatinine, Ser 0.98; Potassium 4.4; Sodium 141  Recent Lipid Panel    Component Value Date/Time   CHOL 130 11/02/2017 0425   TRIG 81 11/02/2017 0425   HDL 31 (L) 11/02/2017 0425   CHOLHDL 4.2 11/02/2017 0425   VLDL 16 11/02/2017 0425   LDLCALC 83 11/02/2017 0425    Physical Exam:    VS:  BP 114/70   Pulse 66   Ht _0  (1.727 m)   Wt 271 lb 1.9 oz (123 kg)   SpO2 94%   BMI 41.22  kg/m     Wt Readings from Last 3 Encounters:  03/29/18 271 lb 1.9 oz (123 kg)  01/10/18 271 lb (122.9 kg)  12/27/17 274 lb 1.9 oz (124.3 kg)     GEN: obese Well nourished, well developed in no acute distress HEENT: Normal NECK: No JVD; No carotid bruits LYMPHATICS: No lymphadenopathy CARDIAC: Occasional ectopy RRR, no murmurs, rubs, gallops RESPIRATORY:  Clear to auscultation without rales, wheezing or rhonchi  ABDOMEN: Soft, non-tender, non-distended MUSCULOSKELETAL:  No edema; No deformity  SKIN: Warm and dry NEUROLOGIC:  Alert and oriented x 3 PSYCHIATRIC:  Normal affect   ASSESSMENT:    1. Chronic combined systolic and diastolic HF (heart failure) ([place])   2. Dilated cardiomyopathy (HCC)   3. Paroxysmal atrial fibrillation ([place])   4. Essential hypertension   5. Coronary artery disease involving native coronary artery of native heart without angina pectoris    PLAN:    In order of problems listed above:  Dilated cardiomyopathy -EF 25%, tolerating Entresto 49/51.  Difficult to increase in the past so we will continue with current dosing.  Labs are stable.  [place], 03/18/2018 echocardiogram was originally reported as normal 55 to 60% however upon personal inspection it does appear to be more in the 45% range.  This however is an improvement from before.  Stable.  Weight is 262 at home he states.  No excessive shortness of breath, NYHA class I-II symptoms.  Persistent atrial fibrillation -Maintaining sinus rhythm with PACs.  Well rate controlled.  Overall doing well.  Changes made in his metoprolol 100 mg twice a day.  Anticoagulation -Coumadin.  Dr. Alyson Ingles has been following.  Stable.  No bleeding.  CAD with remote MI in 2003 -Normal coronary arteries were noted on catheterization.  Medication Adjustments/Labs and Tests Ordered: Current medicines are reviewed at length with the patient today.  Concerns regarding medicines are outlined above.  No orders of the defined  types were placed in this encounter.  No orders of the defined types were placed in this encounter.   Patient Instructions  Medication Instructions:  The current medical regimen is effective;  continue present plan and medications.  If you need a refill on your cardiac medications before your next appointment, please call your pharmacy.   Follow-Up: At [facility], you and your health needs are our priority.  As part of our continuing mission to provide you with exceptional heart care, we have created designated Provider Care Teams.  These Care Teams include your primary Cardiologist (physician) and Advanced Practice Providers (APPs -  Physician Assistants and Nurse Practitioners) who all work together to provide you with the care you need, when you need it. You will need a follow up appointment in 6 months with Cecilie Kicks, NP and 1 year with Dr [name].  Please call our office 2 months in advance to schedule this appointment.  You may see [name], MD or one of the following Advanced Practice Providers on your designated Care Team:   Truitt Merle, NP Cecilie Kicks, NP . Kathyrn Drown, NP  Thank you for choosing [facility]!!        Signed, [name], MD  03/29/2018 11:30 AM    [place]

## 2018-04-09 DIAGNOSIS — Z7901 Long term (current) use of anticoagulants: Secondary | ICD-10-CM | POA: Diagnosis not present

## 2018-04-09 DIAGNOSIS — I4891 Unspecified atrial fibrillation: Secondary | ICD-10-CM | POA: Diagnosis not present

## 2018-04-10 DIAGNOSIS — E7800 Pure hypercholesterolemia, unspecified: Secondary | ICD-10-CM | POA: Diagnosis not present

## 2018-04-10 DIAGNOSIS — M545 Low back pain: Secondary | ICD-10-CM | POA: Diagnosis not present

## 2018-04-10 DIAGNOSIS — R7303 Prediabetes: Secondary | ICD-10-CM | POA: Diagnosis not present

## 2018-04-10 DIAGNOSIS — I5023 Acute on chronic systolic (congestive) heart failure: Secondary | ICD-10-CM | POA: Diagnosis not present

## 2018-04-10 DIAGNOSIS — G4733 Obstructive sleep apnea (adult) (pediatric): Secondary | ICD-10-CM | POA: Diagnosis not present

## 2018-04-10 DIAGNOSIS — N400 Enlarged prostate without lower urinary tract symptoms: Secondary | ICD-10-CM | POA: Diagnosis not present

## 2018-04-10 DIAGNOSIS — I420 Dilated cardiomyopathy: Secondary | ICD-10-CM | POA: Diagnosis not present

## 2018-04-10 DIAGNOSIS — I10 Essential (primary) hypertension: Secondary | ICD-10-CM | POA: Diagnosis not present

## 2018-04-10 DIAGNOSIS — I480 Paroxysmal atrial fibrillation: Secondary | ICD-10-CM | POA: Diagnosis not present

## 2018-04-19 ENCOUNTER — Other Ambulatory Visit: Payer: Self-pay | Admitting: *Deleted

## 2018-04-19 NOTE — Patient Outreach (Signed)
[facility]) Care Management  04/19/2018  OREL COOLER 10/05/1945 [id number]   [place] Initial Assessment  Referral Date:01/18/2018 Referral Source:Transfer from [place] Reason for Referral:Continued Disease Management Education Insurance:[name]   Outreach Attempt:  Outreach attempt #3 to patient for initial telephone assessment.  Wife answered and stated her and patient was out shopping; requesting a telephone call back.   Plan:  RN Health Coach will attempt another telephone outreach to patient in the month of March if no return call back from patient.  [place] [phone] [email or web address]

## 2018-04-29 IMAGING — CT CT ABD-PELV W/ CM
2 of 5 series · 16 of 46 positions shown, 18 images · IV contrast (ISOVUE)
Comparison: None

CLINICAL DATA: Lower abdominal pain and constipation.

EXAM:
CT ABDOMEN AND PELVIS WITH CONTRAST
TECHNIQUE: Multidetector CT imaging of the abdomen and pelvis was performed
using the standard protocol following bolus administration of
intravenous contrast.
CONTRAST:  100mL M5TPCO-1GG IOPAMIDOL (M5TPCO-1GG) INJECTION 61%

[Series 2: abd/pel with · axial · 0.94mm/px · z∈[-622,-202]mm · 13 of 97 slices shown, 15 images]
[im 7/97  soft-tissue]
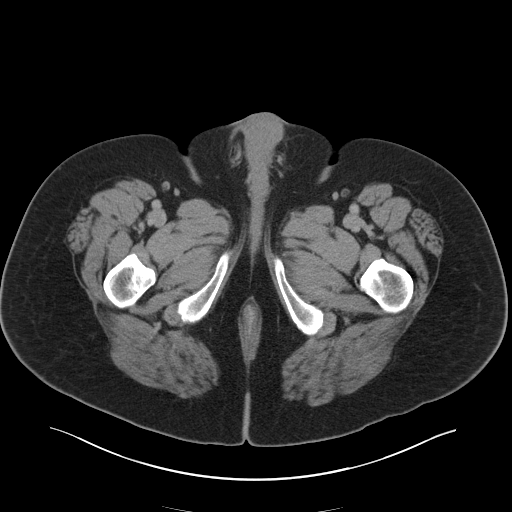
[im 7/97  bone]
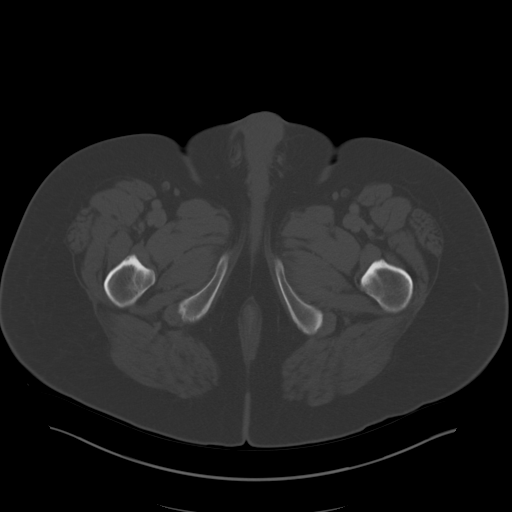
[im 13/97  soft-tissue]
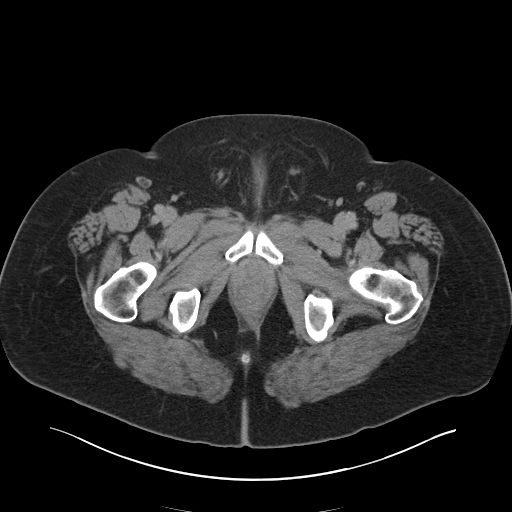
[im 19/97  soft-tissue]
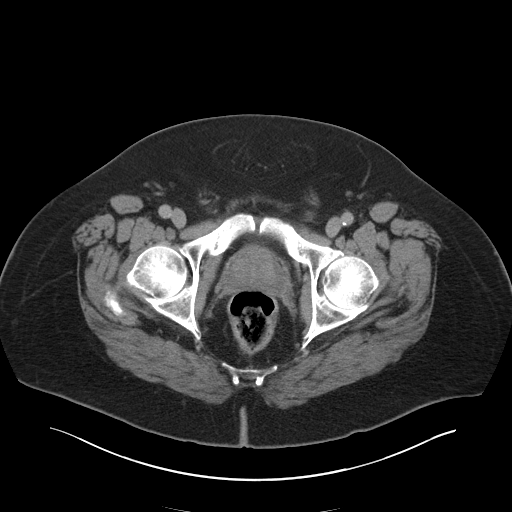
[im 31/97  soft-tissue]
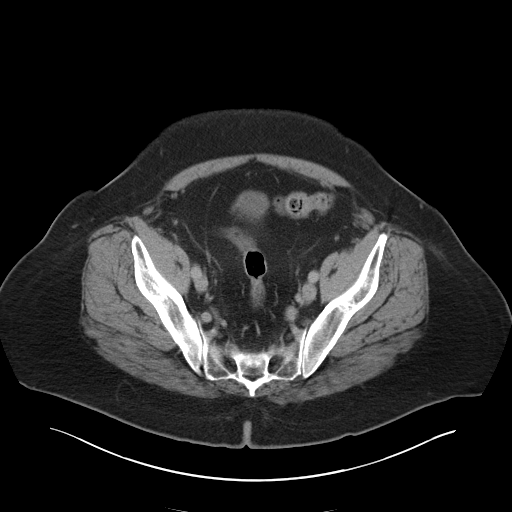
[im 37/97  soft-tissue]
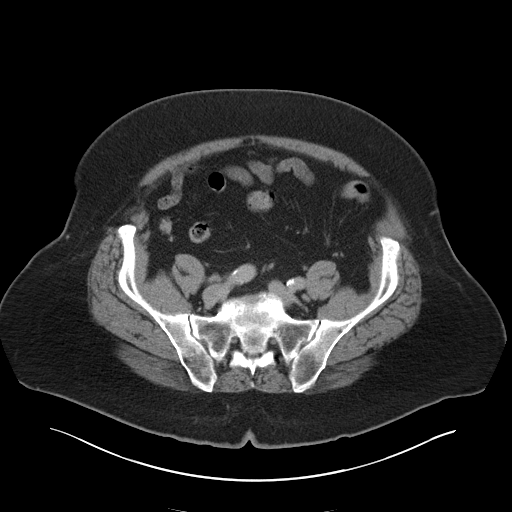
[im 43/97  soft-tissue]
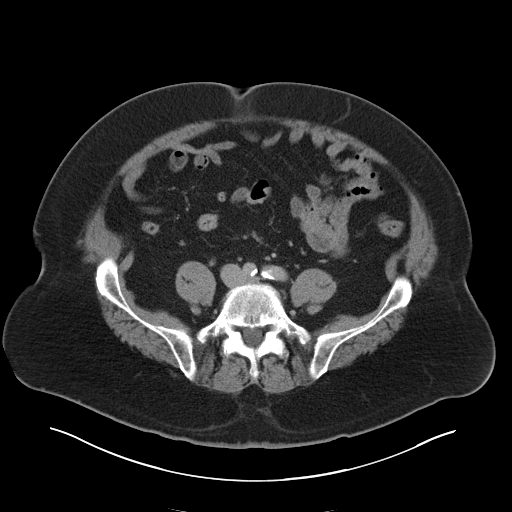
[im 49/97  soft-tissue]
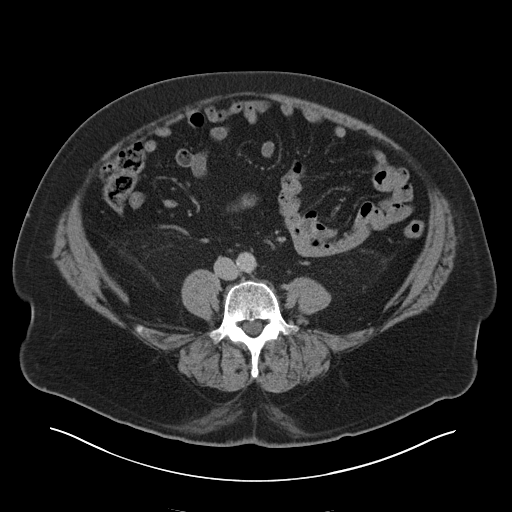
[im 55/97  soft-tissue]
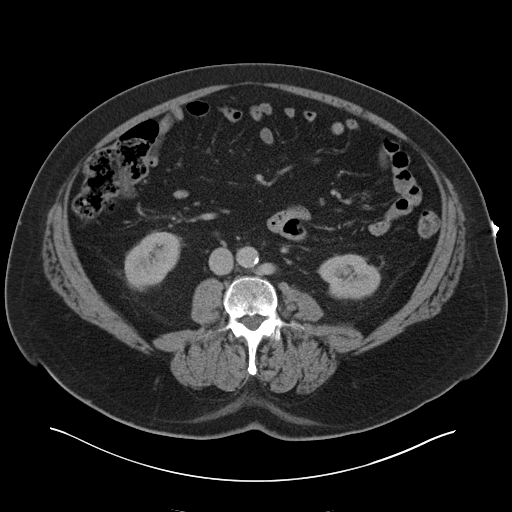
[im 61/97  soft-tissue]
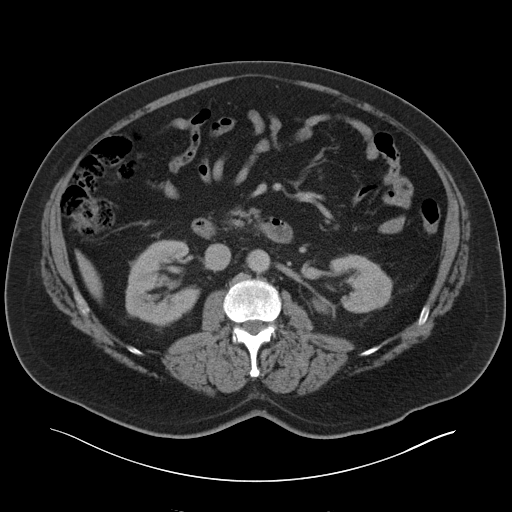
[im 61/97  bone]
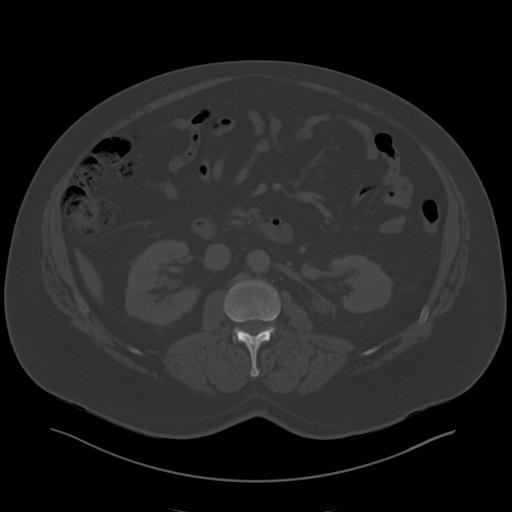
[im 67/97  soft-tissue]
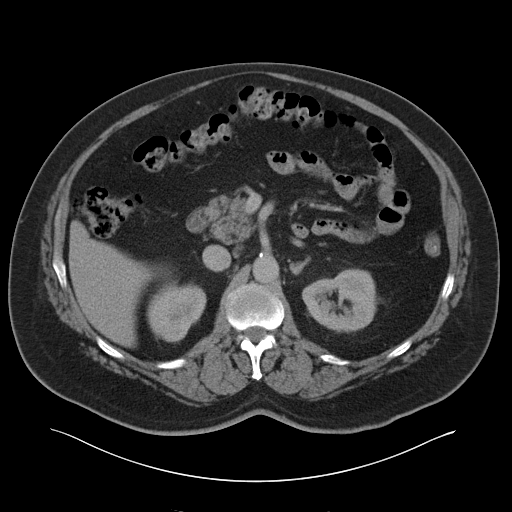
[im 79/97  soft-tissue]
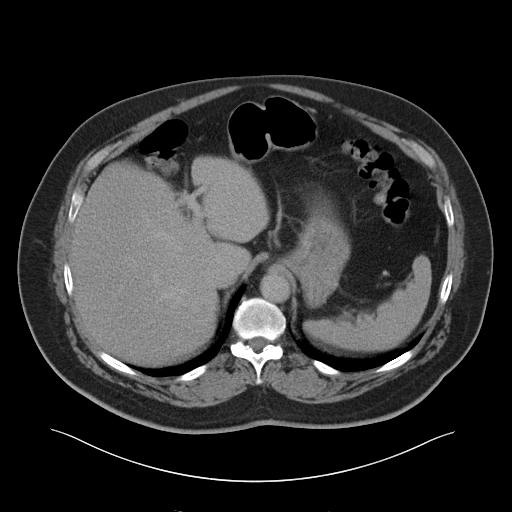
[im 85/97  soft-tissue]
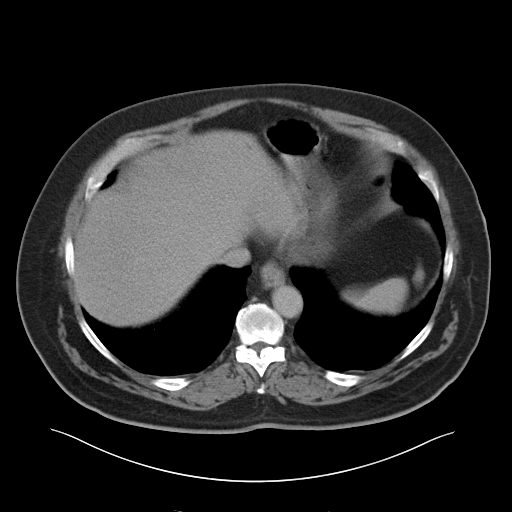
[im 91/97  soft-tissue]
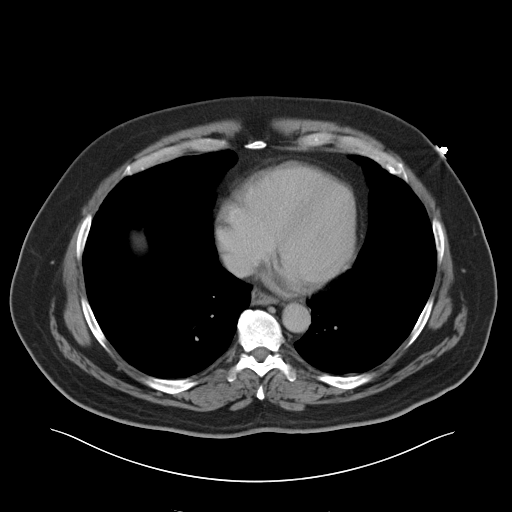

[Series 4: coronal a/|p · coronal · 0.85mm/px · 3 of 190 slices shown]
[im 64/190  soft-tissue]
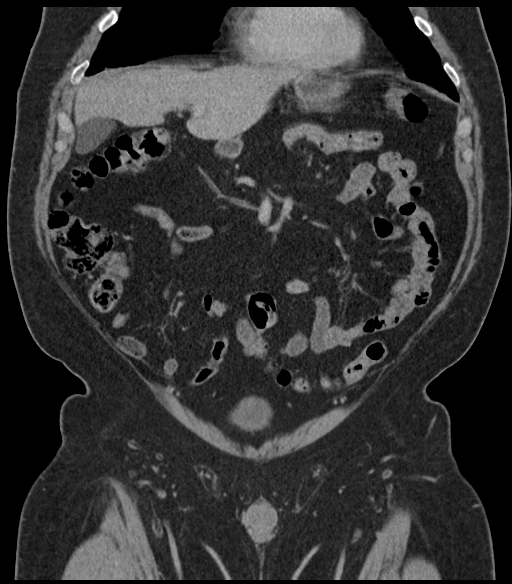
[im 85/190  soft-tissue]
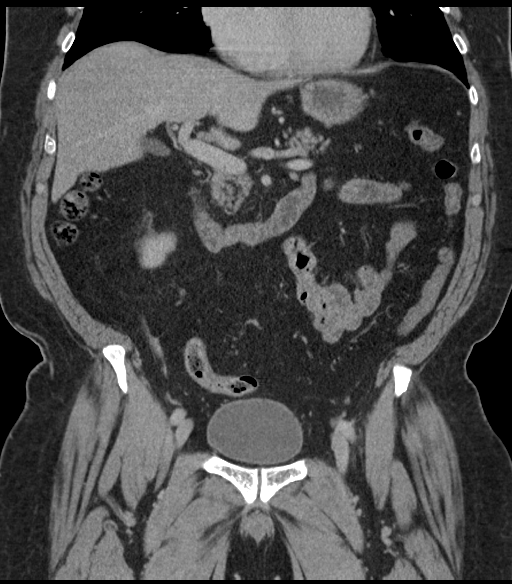
[im 106/190  soft-tissue]
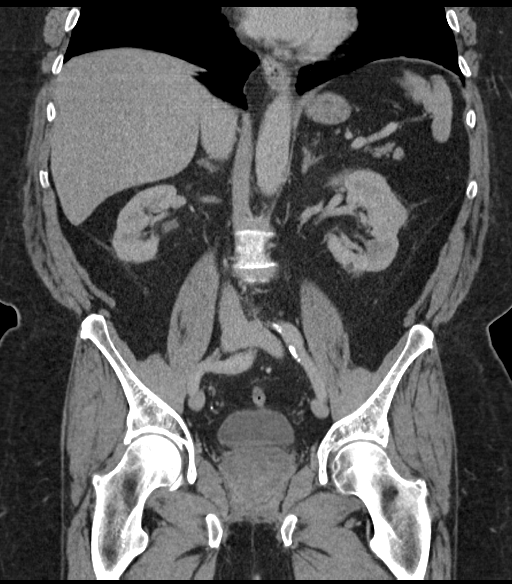

[16 of 46 positions shown; findings below may reference images not displayed]

FINDINGS: Lower chest: No pulmonary nodules. No visible pleural or pericardial
effusion.

Hepatobiliary: Normal hepatic size and contours without focal liver
lesion. No perihepatic ascites. No intra- or extrahepatic biliary
dilatation. Normal gallbladder.

Pancreas: Normal pancreatic contours and enhancement. No
peripancreatic fluid collection or pancreatic ductal dilatation.

Spleen: Normal.

Adrenals/Urinary Tract: Normal adrenal glands. No hydronephrosis or
solid renal mass.

Stomach/Bowel: No abnormal bowel dilatation. No bowel wall
thickening or adjacent fat stranding to indicate acute inflammation.
No abdominal fluid collection. Normal appendix. Normal amount of
colonic stool.

Vascular/Lymphatic: There is atherosclerotic calcification of the
non aneurysmal abdominal aorta. No abdominal or pelvic adenopathy.

Reproductive: Incompletely visualized suspected small right
hydrocele. Mildly enlarged prostate. Normal seminal vesicles.

Musculoskeletal: There is no bony spinal canal stenosis. No lytic or
blastic lesions. Normal visualized extrathoracic and extraperitoneal
soft tissues.

Other: No contributory non-categorized findings.
IMPRESSION: 1. No acute abnormality of the abdomen or pelvis.
2. No colonic dilatation or abnormal amount of stool.

## 2018-05-07 ENCOUNTER — Other Ambulatory Visit: Payer: Self-pay | Admitting: Cardiology

## 2018-05-07 DIAGNOSIS — I4891 Unspecified atrial fibrillation: Secondary | ICD-10-CM | POA: Diagnosis not present

## 2018-05-07 DIAGNOSIS — Z7901 Long term (current) use of anticoagulants: Secondary | ICD-10-CM | POA: Diagnosis not present

## 2018-05-15 ENCOUNTER — Other Ambulatory Visit: Payer: Self-pay | Admitting: *Deleted

## 2018-05-15 NOTE — Patient Outreach (Signed)
[facility]) Care Management  05/15/2018  Ruben Murray 01-24-1946 [id number]   [place] Initial Assessment  Referral Date:01/18/2018 Referral Source:Transfer from [place] Reason for Referral:Continued Disease Management Education Insurance:[name]   Outreach Attempt:  Outreach attempt #4 to patient for initial telephone assessment.  Patient answered and verified HIPAA.  States he and his wife are heading out the door and is requesting a telephone call back.  Encouraged patient to call [place] back when he and wife are available.   Plan:  RN Health Coach will make another outreach attempt within the month of April if no return call back from patient.  [place] [phone] [email or web address] [email or web address]

## 2018-06-04 DIAGNOSIS — Z7901 Long term (current) use of anticoagulants: Secondary | ICD-10-CM | POA: Diagnosis not present

## 2018-06-04 DIAGNOSIS — I4891 Unspecified atrial fibrillation: Secondary | ICD-10-CM | POA: Diagnosis not present

## 2018-06-07 ENCOUNTER — Other Ambulatory Visit: Payer: Self-pay | Admitting: *Deleted

## 2018-06-07 ENCOUNTER — Encounter: Payer: Self-pay | Admitting: *Deleted

## 2018-06-07 NOTE — Patient Outreach (Signed)
[facility]) Care Management  [place]  06/07/2018   Ruben Murray 01-17-1946 [id number]   Referral Date:  01/18/2018 Referral Source:  Transfer from [place] Reason for Referral:  Continued Disease Management Education Insurance:  [facility] Medicare    Outreach Attempt:  Successful telephone outreach to patient for initial telephone assessment.  HIPAA verified with patient.  Patient and wife completed initial telephone assessment.  Social:  Patient lives at home with wife.  Reports being independent with ADLs and IADLs.  Ambulates with cane and denies any falls in the last year.  Wife transports to medical appointments.  DME in the home include:  straight cane, rolling walker, upper dentures, lower partials, blood pressure cuff, shower chair with back, grab bars in shower, scale, and CPAP.  Conditions:   Per chart review and discussion with patient and wife, PMH include but not limited to:  Chronic combined systolic and diastolic heart failure, cardiomyopathy, atrial fibrillation, hyperlipidemia, hypertension, myocardial infarction, seizures, and cataract extractions.  Denies any recent hospitalizations or emergency room visits.  Reports weighing himself daily.  Weight this morning was 261 pounds.  Denies any shortness of breath.  Does report some swelling I his feet that is always there.  Reports having a callous on side of right foot.  Encouraged patient to discuss with provider and request podiatrist referral.  Medications:  Reports taking about 9 medications.  Wife helps manage medications and denies any current issues with affording medications at this time.  Reports receiving assistance affording Entresto.  Encounter Medications:  Outpatient Encounter Medications as of 06/07/2018  Medication Sig Note  . acetaminophen (TYLENOL) 500 MG tablet Take 500-1,000 mg by mouth at bedtime as needed for moderate pain.    . Carboxymethylcellul-Glycerin  (LUBRICATING EYE DROPS OP) Place 1 drop into both eyes daily as needed (dry eyes).   Marland Kitchen ENTRESTO 49-51 MG Take 1 tablet by mouth twice daily   . famotidine (PEPCID) 20 MG tablet One at bedtime   . fluticasone (FLONASE) 50 MCG/ACT nasal spray Place 2 sprays into both nostrils 2 (two) times daily.   . furosemide (LASIX) 40 MG tablet Take 1 tablet (40 mg total) by mouth daily.   . metoprolol tartrate (LOPRESSOR) 100 MG tablet Take 1 tablet (100 mg total) by mouth 2 (two) times daily.   Marland Kitchen PHENobarbital (LUMINAL) 97.2 MG tablet Take 97.2 mg by mouth every morning.   . polyethylene glycol (MIRALAX / GLYCOLAX) packet Take 17 g by mouth daily as needed.   . potassium chloride SA (K-DUR,KLOR-CON) 20 MEQ tablet Take 1 tablet (20 mEq total) by mouth daily.   . pravastatin (PRAVACHOL) 40 MG tablet Take 40 mg by mouth at bedtime.    . triamcinolone cream (KENALOG) 0.1 % Apply 1 application topically as needed (rash).   . warfarin (COUMADIN) 3 MG tablet Take 1 tablet by mouth every other day. 06/07/2018: Alternating 4 mg with 5 mg per patient's wife  . warfarin (COUMADIN) 4 MG tablet Take 4 mg by mouth every other day.    . pantoprazole (PROTONIX) 40 MG tablet Take 40 mg by mouth daily. 06/07/2018: Reports not taking  . tiZANidine (ZANAFLEX) 4 MG tablet Take 4 mg by mouth daily. 06/07/2018: Reports not taking currently, states medication was stopped   No facility-administered encounter medications on file as of 06/07/2018.     Functional Status:  In your present state of health, do you have any difficulty performing the following activities: 06/07/2018 11/15/2017  Hearing? Y N  Comment trouble hearing -  Vision? N N  Difficulty concentrating or making decisions? N N  Walking or climbing stairs? [name]  Comment difficulties climbing stairs due to knees -  Dressing or bathing? N N  Doing errands, shopping? N Y  Conservation officer, nature and eating ? N N  Using the Toilet? N N  In the past six months, have you accidently leaked  urine? N Y  Do you have problems with loss of bowel control? N N  Managing your Medications? [name]  Comment wife assist with management of medications -  Managing your Finances? N Y  Housekeeping or managing your Housekeeping? N Y  Some recent data might be hidden    Fall/Depression Screening: Fall Risk  06/07/2018 02/18/2018 11/15/2017  Falls in the past year? 0 1 No  Number falls in past yr: - 0 -  Injury with Fall? - 0 -  Risk for fall due to : Impaired vision;Medication side effect;Impaired balance/gait;Impaired mobility;History of fall(s) History of fall(s);Impaired vision;Medication side effect;Impaired balance/gait;Impaired mobility -  Follow up Falls evaluation completed;Falls prevention discussed;Education provided Education provided;Falls prevention discussed -   PHQ 2/9 Scores 06/07/2018 02/18/2018 11/15/2017  PHQ - 2 Score 0 1 0   [facility] CM Care Plan Problem One     Most Recent Value  Care Plan Problem One  Continued learning related to diagnosis of heart failure and advance directives.  Role Documenting the Problem One  [place] for Problem One  Active  [facility] Long Term Goal   Patient will not have any emergency room visits in the next 90 days.  [facility]N Long Term Goal Start Date  06/07/18  Interventions for Problem One Long Term Goal  Reviewed and discussed current care plan and goals, encouraged to keep and attend medical appointments, reviewed medications and indications and encouraged medication compliance, reviewed Heart Failure zones and signs and symptoms for each zone, encouraged to continue to weigh daily and discussed when to call physician based on weight, encouraged patient to discuss foot callous with provider and to discuss need to see podiatrist  [facility] CM Short Term Goal #1   Patient will discuss DNR wishes with primary care provider in the next 90 days.  [facility]N CM Short Term Goal #1 Start Date  06/07/18  Interventions for Short Term Goal #1  Discussed advance  directives with patient and wife, explained the difference Living Will and actual paperwork for do not rescucitate orders, reviewed yellow DNR or pink MOST for home to allow EMS not to perform CPR, encouraged patient to discuss his wishes with his primary care provider and request paperwork for home if he wishes to to be a DNR     Appointments:  States he has scheduled appointment in June 2020 with primary care provider, Dr. Alyson Ingles.  Advanced Directives:  Reports having a Living Will and does not wish to make any changes at this time.  Patient verbalizing he wishes to be a DNR.  Encouraged patient to discuss his wished with primary care provider and if continues to wish DNR status to request out of facility paperwork to post on refrigerator at home.   Consent:  [facility] Services reviewed and discussed.  Patient verbally agrees to Disease Management outreaches.  Plan: RN Health Coach will send primary MD barriers letter. RN Health Coach will route initial telephone assessment note to primary MD. [place] will make next telephone outreach to patient in the month of  JulyHubert Azure RN [place] [phone] Martine [email or web address] .Patsi Sears

## 2018-07-02 DIAGNOSIS — I4891 Unspecified atrial fibrillation: Secondary | ICD-10-CM | POA: Diagnosis not present

## 2018-07-02 DIAGNOSIS — Z7901 Long term (current) use of anticoagulants: Secondary | ICD-10-CM | POA: Diagnosis not present

## 2018-07-25 ENCOUNTER — Telehealth (HOSPITAL_COMMUNITY): Payer: Self-pay | Admitting: Cardiology

## 2018-07-25 NOTE — Telephone Encounter (Signed)
New Message:    Wife said Pt needs a  letter stating that pt was seen by Dr [name] in January of this year please. He need this statement for his rent.  Please fax this to [phone] Att: Leanora Cover

## 2018-07-25 NOTE — Telephone Encounter (Signed)
Dr. [name] and Jeannene Patella can you assist with this. If you can write a note and send it back to triage we can copy it into a letter and send it to the patient.

## 2018-07-30 ENCOUNTER — Encounter: Payer: Self-pay | Admitting: *Deleted

## 2018-07-30 DIAGNOSIS — Z7901 Long term (current) use of anticoagulants: Secondary | ICD-10-CM | POA: Diagnosis not present

## 2018-07-30 DIAGNOSIS — I4891 Unspecified atrial fibrillation: Secondary | ICD-10-CM | POA: Diagnosis not present

## 2018-07-30 LAB — PROTIME-INR

## 2018-07-30 NOTE — Telephone Encounter (Signed)
Letter has been completed and taken to MR to be faxed as requested.

## 2018-08-02 DIAGNOSIS — R351 Nocturia: Secondary | ICD-10-CM | POA: Diagnosis not present

## 2018-08-02 DIAGNOSIS — R972 Elevated prostate specific antigen [PSA]: Secondary | ICD-10-CM | POA: Diagnosis not present

## 2018-08-02 DIAGNOSIS — N401 Enlarged prostate with lower urinary tract symptoms: Secondary | ICD-10-CM | POA: Diagnosis not present

## 2018-08-06 ENCOUNTER — Telehealth: Payer: Self-pay | Admitting: Cardiology

## 2018-08-06 NOTE — Telephone Encounter (Signed)
Spoke with Ruben Murray who is getting a hair cut and gives me permission to discuss his concerns with his wife Arville Go. Per Arville Go Ruben Murray's wt has been at 270 lbs for he last 3 to 4 days.  He is normally at 264-265 lbs.  She denies any change in diet or increase in NA+ intake.  He has some edema at his ankles and a little bit of cough, more like sinus drainage though.  Wife repports she has been encouraging him to keep his feet and legs elevated but it hasn't helped that much.  He is still taking Furosemide 40 mg once daily.  Advised I will review with Dr [name] and c/b with further orders.  Wife states understanding and is in agreement.

## 2018-08-06 NOTE — Telephone Encounter (Signed)
Reviewed information with Dr [name] who gave verbal orders to increase Furosemide 40 mg BID for 4 days then return to normal dose.  Call back if no improvement in wt or s/s.  Pt's wife is aware and repeats orders correctly.

## 2018-08-06 NOTE — Telephone Encounter (Signed)
New Message            Patient is calling because he is swelling and gaining weight.       Pt c/o swelling: STAT is pt has developed SOB within 24 hours  1) How much weight have you gained and in what time span? 264 to 270  2) If swelling, where is the swelling located? Legs  3) Are you currently taking a fluid pill? Yes  4) Are you currently SOB? Just a little bit  5) Do you have a log of your daily weights (if so, list)? Yes  6) Have you gained 3 pounds in a day or 5 pounds in a week? Yes  7) Have you traveled recently? No

## 2018-08-12 ENCOUNTER — Telehealth: Payer: Self-pay | Admitting: Cardiology

## 2018-08-12 NOTE — Telephone Encounter (Signed)
Wife of Patient called. She called the office last week to report that her Husband had swelling in his legs, and was directed to double up on his fluid pills. The wife reports that the extra fluid pill helped, and he does not seem to have any swelling now. He did report a weight this morning of 268.

## 2018-08-13 NOTE — Telephone Encounter (Signed)
Noted! Thank you

## 2018-08-16 ENCOUNTER — Telehealth: Payer: Self-pay | Admitting: Cardiology

## 2018-08-16 NOTE — Telephone Encounter (Signed)
New Message     Pt c/o medication issue:  1. Name of Medication: Lasix   2. How are you currently taking this medication (dosage and times per day)? 64m once a day in the morning  3. Are you having a reaction (difficulty breathing--STAT)? N/A  4. What is your medication issue?  Patient states she doesn't see any swelling but husband weight went from 268 two days ago then 269 yesterday and now it's 270.  So they want to know if he should take an extra water pill.

## 2018-08-16 NOTE — Telephone Encounter (Signed)
I spoke to the patient's wife who called because the patient has gained 3 lbs over the past couple of days and is a little SOB.  I advised him to take an extra Lasix 40 mg in the afternoon today, Saturday and Sunday.  They will call on Monday with an update on weight and SOB.

## 2018-08-19 NOTE — Telephone Encounter (Signed)
Follow up    Patients wife is calling to advise that the patients weight has went down 6lbs. But he is also still winded.

## 2018-08-19 NOTE — Telephone Encounter (Signed)
Called patient. Spoke with him and his wife. Pt weight today was 264.  His breathing is NO better.   Started few days ago.  Very weak and very easily winded.  Can barely get from car to apartment due to getting SOB. Heart pounds with wallking and feels irregular.No chest pain.  No fever.  Appetite fair.   Drinking up to eight 8oz glasses of liquids per day.  BP 92/62, 76 BP 98/57 89 Feels like he's trembling all day long. Hx of PAF Scheduled with Maryla Morrow for tomorrow am. Prescreen below. Pt aware to come inside alone.  His wife will stay in car and will need called during visit.        COVID-19 Pre-Screening Questions:  . In the past 7 to 10 days have you had a cough,  shortness of breath, headache, congestion, fever (100 or greater) body aches, chills, sore throat, or sudden loss of taste or sense of smell?  NO . Have you been around anyone with known Covid 19.  NO . Have you been around anyone who is awaiting Covid 19 test results in the past 7 to 10 days?  NO . Have you been around anyone who has been exposed to Covid 19, or has mentioned symptoms of Covid 19 within the past 7 to 10 days?  NO  If you have any concerns/questions about symptoms patients report during screening (either on the phone or at threshold). Contact the provider seeing the patient or DOD for further guidance.  If neither are available contact a member of the leadership team.

## 2018-08-20 ENCOUNTER — Ambulatory Visit (INDEPENDENT_AMBULATORY_CARE_PROVIDER_SITE_OTHER): Payer: Medicare HMO | Admitting: Cardiology

## 2018-08-20 ENCOUNTER — Other Ambulatory Visit: Payer: Self-pay

## 2018-08-20 ENCOUNTER — Telehealth: Payer: Self-pay

## 2018-08-20 ENCOUNTER — Encounter: Payer: Self-pay | Admitting: Cardiology

## 2018-08-20 VITALS — BP 90/62 | HR 104 | Ht 68.0 in | Wt 274.0 lb

## 2018-08-20 DIAGNOSIS — I5042 Chronic combined systolic (congestive) and diastolic (congestive) heart failure: Secondary | ICD-10-CM

## 2018-08-20 DIAGNOSIS — R0602 Shortness of breath: Secondary | ICD-10-CM

## 2018-08-20 DIAGNOSIS — I4819 Other persistent atrial fibrillation: Secondary | ICD-10-CM

## 2018-08-20 LAB — BASIC METABOLIC PANEL WITH GFR
BUN/Creatinine Ratio: 18 (ref 10–24)
BUN: 19 mg/dL (ref 8–27)
CO2: 26 mmol/L (ref 20–29)
Calcium: 9.1 mg/dL (ref 8.6–10.2)
Chloride: 102 mmol/L (ref 96–106)
Creatinine, Ser: 1.08 mg/dL (ref 0.76–1.27)
GFR calc Af Amer: 77 mL/min/{1.73_m2}
GFR calc non Af Amer: 67 mL/min/{1.73_m2}
Glucose: 101 mg/dL — ABNORMAL HIGH (ref 65–99)
Potassium: 4.7 mmol/L (ref 3.5–5.2)
Sodium: 143 mmol/L (ref 134–144)

## 2018-08-20 LAB — PRO B NATRIURETIC PEPTIDE: NT-Pro BNP: 433 pg/mL (ref 0–486)

## 2018-08-20 MED ORDER — FUROSEMIDE 20 MG PO TABS: 20.0000 mg | ORAL_TABLET | Freq: Every day | ORAL | 3 refills | Status: DC

## 2018-08-20 MED ORDER — POTASSIUM CHLORIDE CRYS ER 10 MEQ PO TBCR: 10.0000 meq | EXTENDED_RELEASE_TABLET | Freq: Every day | ORAL | 3 refills | Status: DC

## 2018-08-20 NOTE — Patient Instructions (Addendum)
Medication Instructions:  Your physician recommends that you continue on your current medications as directed. Please refer to the Current Medication list given to you today.  If you need a refill on your cardiac medications before your next appointment, please call your pharmacy.   Lab work: BMET & BNP  If you have labs (blood work) drawn today and your tests are completely normal, you will receive your results only by: Marland Kitchen MyChart Message (if you have MyChart) OR . A paper copy in the mail If you have any lab test that is abnormal or we need to change your treatment, we will call you to review the results.  Testing/Procedures: You have been referred to our A-Fib clinic   Follow-Up: At [facility], you and your health needs are our priority.  As part of our continuing mission to provide you with exceptional heart care, we have created designated Provider Care Teams.  These Care Teams include your primary Cardiologist (physician) and Advanced Practice Providers (APPs -  Physician Assistants and Nurse Practitioners) who all work together to provide you with the care you need, when you need it. You will need a follow up appointment in 1 months after you are seen by the Afib clinic.  Please call our office 2 months in advance to schedule this appointment.  You may see [name], MD or one of the following Advanced Practice Providers on your designated Care Team:   Truitt Merle, NP Cecilie Kicks, NP . Kathyrn Drown, NP  Any Other Special Instructions Will Be Listed Below (If Applicable).

## 2018-08-20 NOTE — Telephone Encounter (Signed)
-----  Message from Daune Perch, NP sent at 08/20/2018  4:45 PM EDT ----- BNP was not elevated, so does not look like he is having volume overload. With low BP I think he is a little dry. Please have him drink 2 extra cups of water today and decrease his lasix to 20 mg daily. We may also need to decrease it further in the future. We will see. Decrease KDur to 78mq daily. He should monitor how he feels on this regimen and let uKoreaknow if he develops too much fluid- swelling, increased breathing issues, increased weight of more than 5 lbs.   JDaune Perch NP

## 2018-08-20 NOTE — Telephone Encounter (Signed)
Spoke with pt he is aware of results. Pt verbalized understanding.

## 2018-08-20 NOTE — Progress Notes (Addendum)
Cardiology Office Note:    Date:  08/20/2018   ID:  BLAYTON HUTTNER, DOB Nov 25, 1945, MRN [id number]  PCP:  [name], MD  Cardiologist:  [name], MD  Referring MD: [name], MD   Chief Complaint  Patient presents with  . Fatigue  . Shortness of Breath    History of Present Illness:    Ruben Murray is a 73 y.o. male with a past medical history significant for cardiomyopathy, CHF, CAD with remote MI but normal cath 2003, paroxysmal atrial fibrillation on coumadin, hypertension, hyperlipidemia, RBBB, seizures, morbid obesity. EF had previously been 25%, up to 40-45% in 03/2018 on treatment with Entresto. He had DCCV for afib in 12/2017.   CHADSVASC is 4 for CHF, HTN, age, vascular disease (aortic altherosclerosis). He is maintained on Coumadin  Mr. Repass is being seen in the office today for evaluation of increased wt. Today he reports that he is very weak, short of breath with minimal exertion and he "conks out" whenever he sits down. Home wt has been staying 265-270, this am 266. It had been up to 270 two days ago. No chest pain/pressure.  He has been limiting fluid intake, no more than 6 cups- he measures it.   Sleeps in recliner d/t back pain. No swelling. Lungs clear.   On Entresto, no decrease in lasix.  He has been taking all meds as directed.   Past Medical History:  Diagnosis Date  . Aortic atherosclerosis ([place])   . CAP (community acquired pneumonia) 03/06/2016  . Cardiomyopathy ([place])    a. EF 40-45% in 04/2016, etiology not defined, managed medically.  . Chronic combined systolic and diastolic CHF (congestive heart failure) ([place])   . Hyperlipidemia   . Hypertension   . Morbid obesity ([place])   . Myocardial infarction [facility]) 1980-early 2000s X 2   "mild ones" (03/06/2016)  . OSA on CPAP    a. pt states was told by Dr. Maxwell Caul that he would not require CPAP as long as he continued to sleep in recliner which he does for his back.  . Paroxysmal atrial  fibrillation ([place])   . Pneumonia    "when I was a kid" (03/06/2016)  . RBBB   . Seizures ([place])    "take RX daily" (03/06/2016)    Past Surgical History:  Procedure Laterality Date  . CARDIOVERSION N/A 12/13/2017   Procedure: CARDIOVERSION;  Surgeon: [name], MD;  Location: [place];  Service: Cardiovascular;  Laterality: N/A;  . CIRCUMCISION    . JOINT REPLACEMENT    . PERCUTANEOUS PINNING TOE FRACTURE Left    "big toe  . REPLACEMENT TOTAL KNEE Left   . TONSILLECTOMY AND ADENOIDECTOMY      Current Medications: Current Meds  Medication Sig  . acetaminophen (TYLENOL) 500 MG tablet Take 500-1,000 mg by mouth at bedtime as needed for moderate pain.   . Carboxymethylcellul-Glycerin (LUBRICATING EYE DROPS OP) Place 1 drop into both eyes daily as needed (dry eyes).  Marland Kitchen ENTRESTO 49-51 MG Take 1 tablet by mouth twice daily  . famotidine (PEPCID) 20 MG tablet One at bedtime  . fluticasone (FLONASE) 50 MCG/ACT nasal spray Place 2 sprays into both nostrils 2 (two) times daily.  . metoprolol tartrate (LOPRESSOR) 100 MG tablet Take 1 tablet (100 mg total) by mouth 2 (two) times daily.  Marland Kitchen PHENobarbital (LUMINAL) 97.2 MG tablet Take 97.2 mg by mouth every morning.  . polyethylene glycol (MIRALAX / GLYCOLAX) packet Take 17 g by mouth daily as  needed.  . pravastatin (PRAVACHOL) 40 MG tablet Take 40 mg by mouth at bedtime.   Marland Kitchen warfarin (COUMADIN) 4 MG tablet Take 4 mg by mouth every other day.   . warfarin (COUMADIN) 5 MG tablet Take 1 tablet by mouth every other day. With 4 mg  . [DISCONTINUED] furosemide (LASIX) 40 MG tablet Take 1 tablet (40 mg total) by mouth daily.  . [DISCONTINUED] potassium chloride SA (K-DUR,KLOR-CON) 20 MEQ tablet Take 1 tablet (20 mEq total) by mouth daily.     Allergies:   Dilaudid [hydromorphone hcl] and Tegretol [carbamazepine]   Social History   Socioeconomic History  . Marital status: Married    Spouse name: Not on file  . Number of children: Not on  file  . Years of education: Not on file  . Highest education level: Not on file  Occupational History  . Not on file  Social Needs  . Financial resource strain: Not on file  . Food insecurity    Worry: Not on file    Inability: Not on file  . Transportation needs    Medical: Not on file    Non-medical: Not on file  Tobacco Use  . Smoking status: Former Smoker    Packs/day: 3.00    Years: 48.00    Pack years: 144.00    Types: Cigarettes    Quit date: 2000    Years since quitting: 20.4  . Smokeless tobacco: Never Used  Substance and Sexual Activity  . Alcohol use: No  . Drug use: No  . Sexual activity: Never  Lifestyle  . Physical activity    Days per week: Not on file    Minutes per session: Not on file  . Stress: Not on file  Relationships  . Social Herbalist on phone: Not on file    Gets together: Not on file    Attends religious service: Not on file    Active member of club or organization: Not on file    Attends meetings of clubs or organizations: Not on file    Relationship status: Not on file  Other Topics Concern  . Not on file  Social History Narrative  . Not on file     Family History: The patient's family history includes Hypertension in his mother; Other in his father. ROS:   Please see the history of present illness.     All other systems reviewed and are negative.  EKGs/Labs/Other Studies Reviewed:    The following studies were reviewed today:  Echocardiogram 03/18/2018 Study Conclusions  - Left ventricle: The cavity size was normal. Wall thickness was   normal. Systolic function was mildly to moderately reduced. The   estimated ejection fraction was in the range of 40% to 45%. Wall   motion was normal; there were no regional wall motion   abnormalities. Doppler parameters are consistent with abnormal   left ventricular relaxation (grade 1 diastolic dysfunction). - Right ventricle: The cavity size was mildly dilated. - Right  atrium: The atrium was mildly dilated. - Pulmonary arteries: Systolic pressure was mildly to moderately   increased. PA peak pressure: 40 mm Hg (S).  Echo 10/30/17: EF 25%, diffuse hypokinesis  Echo 04/26/2016: EF 40-45%   EKG:  EKG is ordered today.  The ekg ordered today demonstrates atrial fibrillation with RVR, 104 bpm. RBBB.   Recent Labs: 10/29/2017: B Natriuretic Peptide 539.2 10/30/2017: ALT 19; TSH 1.469 12/10/2017: Hemoglobin 15.6; Platelets 176 12/27/2017: Magnesium 2.1 08/20/2018: BUN 19;  Creatinine, Ser 1.08; NT-Pro BNP 433; Potassium 4.7; Sodium 143   Recent Lipid Panel    Component Value Date/Time   CHOL 130 11/02/2017 0425   TRIG 81 11/02/2017 0425   HDL 31 (L) 11/02/2017 0425   CHOLHDL 4.2 11/02/2017 0425   VLDL 16 11/02/2017 0425   LDLCALC 83 11/02/2017 0425    Physical Exam:    VS:  BP 90/62   Pulse (!) 104   Ht _0  (1.727 m)   Wt 274 lb (124.3 kg)   BMI 41.66 kg/m     Wt Readings from Last 3 Encounters:  08/20/18 274 lb (124.3 kg)  03/29/18 271 lb 1.9 oz (123 kg)  01/10/18 271 lb (122.9 kg)     Physical Exam  Constitutional: He is oriented to person, place, and time. He appears well-developed and well-nourished. No distress.  HENT:  Head: Normocephalic.  Neck: Normal range of motion. Neck supple. No JVD present.  Cardiovascular: Normal rate. An irregularly irregular rhythm present. Exam reveals no gallop and no friction rub.  No murmur heard. Pulmonary/Chest: Effort normal. No respiratory distress. He has no wheezes. He has no rales.  Abdominal: Soft. Bowel sounds are normal.  Musculoskeletal: Normal range of motion.        General: No edema.  Neurological: He is alert and oriented to person, place, and time.  Skin: Skin is warm and dry.  Psychiatric: He has a normal mood and affect. His behavior is normal. Judgment and thought content normal.  Vitals reviewed.    ASSESSMENT:    1. Chronic combined systolic and diastolic CHF (congestive  heart failure) ([place])   2. SOB (shortness of breath)   3. Persistent atrial fibrillation    PLAN:    In order of problems listed above:  1. Chronic combined systolic and diastolic heart failure -Pt has been started on good HF regimen with Entresto 49/51 mg BID, metorpolol tartrate 100 mg BID, lasix 40 mg daily. EF improved by echo in 03/2018 from 25% to 40-45%.  -Pt here today initially was felt to have increased wt, however, wt is stable and pt complains weakness, severe fatigue, shortness of breth and can't stay awake. He does not appear to have any edema or signs of volume overload. His BP is low. He is back in afib with HR in low 100's.  -When he was put on Entresto with then escalated dose, his diuretic was continued at same dose. With the good diuretic effects of Entresto, I feel that he may be getting over diuresed leading to weakness, hypotension, elevated HR. -Will check BNP and BMet. -If no increase in BNP, will decrease his lasix.   2. Paroxysmal atrial fibrillation -Hx of DCCV in 12/2017 -EKG today showed afib with RVR at 104 bpm.  -On warfarin for stroke risk reduction with CHADSVASC is 4 for CHF, HTN, age, vascular disease (aortic altherosclerosis). Followed by Dr. Alyson Ingles.  -Dehydration may be contributing to increased rate. -Will not increase BB due to hypotension.  -Will follow up in afib clinic. Consider DCCV.    Medication Adjustments/Labs and Tests Ordered: Current medicines are reviewed at length with the patient today.  Concerns regarding medicines are outlined above. Labs and tests ordered and medication changes are outlined in the patient instructions below:  Patient Instructions  Medication Instructions:  Your physician recommends that you continue on your current medications as directed. Please refer to the Current Medication list given to you today.  If you need a refill on your  cardiac medications before your next appointment, please call your pharmacy.   Lab  work: BMET & BNP  If you have labs (blood work) drawn today and your tests are completely normal, you will receive your results only by: Marland Kitchen MyChart Message (if you have MyChart) OR . A paper copy in the mail If you have any lab test that is abnormal or we need to change your treatment, we will call you to review the results.  Testing/Procedures: You have been referred to our A-Fib clinic   Follow-Up: At [facility], you and your health needs are our priority.  As part of our continuing mission to provide you with exceptional heart care, we have created designated Provider Care Teams.  These Care Teams include your primary Cardiologist (physician) and Advanced Practice Providers (APPs -  Physician Assistants and Nurse Practitioners) who all work together to provide you with the care you need, when you need it. You will need a follow up appointment in 1 months after you are seen by the Afib clinic.  Please call our office 2 months in advance to schedule this appointment.  You may see [name], MD or one of the following Advanced Practice Providers on your designated Care Team:   Truitt Merle, NP Cecilie Kicks, NP . Kathyrn Drown, NP  Any Other Special Instructions Will Be Listed Below (If Applicable).       Signed, Daune Perch, NP  08/21/2018 1:40 PM    [facility]

## 2018-08-20 NOTE — Telephone Encounter (Signed)
  This encounter was created in error - please disregard.

## 2018-08-20 NOTE — Telephone Encounter (Signed)
Thanks for update. Agree with plan. [name], MD]

## 2018-08-23 NOTE — Telephone Encounter (Signed)
I called pt to check on him. His wife helps provide information. He says he is feeling "a little less winded", feels like his energy is a little better. His wt is stable. He does not have batteries for his BP monitor so can't check BP. He has an appt for the afib clinic on Monday.

## 2018-08-26 ENCOUNTER — Other Ambulatory Visit: Payer: Self-pay

## 2018-08-26 ENCOUNTER — Encounter (HOSPITAL_COMMUNITY): Payer: Self-pay | Admitting: Physician Assistant

## 2018-08-26 ENCOUNTER — Ambulatory Visit (HOSPITAL_COMMUNITY)
Admission: RE | Admit: 2018-08-26 | Discharge: 2018-08-26 | Disposition: A | Discharge status: Home / Self Care | Payer: Medicare HMO | Source: Ambulatory Visit | Attending: Nurse Practitioner | Admitting: Nurse Practitioner

## 2018-08-26 VITALS — BP 130/82 | HR 103 | Ht 68.0 in | Wt 283.0 lb

## 2018-08-26 DIAGNOSIS — Z87891 Personal history of nicotine dependence: Secondary | ICD-10-CM | POA: Diagnosis not present

## 2018-08-26 DIAGNOSIS — I4819 Other persistent atrial fibrillation: Secondary | ICD-10-CM | POA: Diagnosis not present

## 2018-08-26 DIAGNOSIS — I5042 Chronic combined systolic (congestive) and diastolic (congestive) heart failure: Secondary | ICD-10-CM | POA: Diagnosis not present

## 2018-08-26 DIAGNOSIS — Z7901 Long term (current) use of anticoagulants: Secondary | ICD-10-CM | POA: Insufficient documentation

## 2018-08-26 DIAGNOSIS — M549 Dorsalgia, unspecified: Secondary | ICD-10-CM | POA: Diagnosis not present

## 2018-08-26 DIAGNOSIS — Z79899 Other long term (current) drug therapy: Secondary | ICD-10-CM | POA: Diagnosis not present

## 2018-08-26 DIAGNOSIS — E785 Hyperlipidemia, unspecified: Secondary | ICD-10-CM | POA: Diagnosis not present

## 2018-08-26 DIAGNOSIS — I110 Hypertensive heart disease with heart failure: Secondary | ICD-10-CM | POA: Diagnosis not present

## 2018-08-26 DIAGNOSIS — G4733 Obstructive sleep apnea (adult) (pediatric): Secondary | ICD-10-CM | POA: Diagnosis not present

## 2018-08-26 DIAGNOSIS — I252 Old myocardial infarction: Secondary | ICD-10-CM | POA: Diagnosis not present

## 2018-08-26 DIAGNOSIS — I4510 Unspecified right bundle-branch block: Secondary | ICD-10-CM | POA: Insufficient documentation

## 2018-08-26 DIAGNOSIS — I429 Cardiomyopathy, unspecified: Secondary | ICD-10-CM | POA: Insufficient documentation

## 2018-08-26 MED ORDER — METOPROLOL TARTRATE 100 MG PO TABS: ORAL_TABLET | ORAL | 11 refills | Status: DC

## 2018-08-26 MED ORDER — POTASSIUM CHLORIDE CRYS ER 10 MEQ PO TBCR: 20.0000 meq | EXTENDED_RELEASE_TABLET | Freq: Every day | ORAL | 3 refills | Status: DC

## 2018-08-26 MED ORDER — FUROSEMIDE 20 MG PO TABS: 40.0000 mg | ORAL_TABLET | Freq: Every day | ORAL | 3 refills | Status: DC

## 2018-08-26 NOTE — Patient Instructions (Signed)
Resume lasix to 50m a day Resume potassium to 264m a day  Increase metoprolol to 10055mn the AM and 150m32m the PM  You will need to start weekly INR checks starting on 6/25 for 4 weeks.

## 2018-08-26 NOTE — Progress Notes (Signed)
Primary Care Physician: [name], MD Referring Physician: Maude Leriche, PA   Ruben Murray is a 73 y.o. male with a h/o obesity, htn, untreated sleep apnea, seizures, that had recently been treated at [facility] for CAP 1/1-1/5. He had f/u with his PCP 2/14 and was found to be in new onset afib. Being seen in [place] clinic 2/15.  He had also been treated with prednisone and finished taper last week. He continues to feel weak recovering from the Pneumonia.   He has OSA but has not used cpap in several months. States that it is broken and needs to see if can get a new machine. No alcohol or tobacco. Moderate caffeine use.  Is sedentary and obese. Has a chadsvasc score of at least 3.  F/u afib clinic 2/19, he was started on warfarin 5 mg on Friday and started diltiazem 120 mg qd. He has returned to [place]. He thinks he feels a little better. He has an appointment to see Dr.Osborne to get his cpap problems addressed. Echo is pending.  Being seen afib clinc 05/08/16 for f/u echo. It did show EF of 40-45% and moderate RAE, RVSP 42 mm, mod TR. Pt has seen Dr. Maxwell Caul for treatment of his sleep apnea, which he was not treating and will be placed on auto cpap with a different mask which he is due to pick up soon to start new treatment. He also reports that he got severely constipated over the weekend and may be due to cardizem. He continues in SR. He states that he has had two mild MI's in the past,around 2005.  Follow up in AF clinic 08/26/18. Patient s/p DCCV 12/2017 and has done well since then until last week when he was seen at [facility] for weakness and SOB. He was found to be in afib HR 104. He continues to have symptoms and is in afib today. No triggers that the patient could identify. His weight is also up today but he denies any edema or swelling.   Today, he denies symptoms of chest pain,   orthopnea, PND, lower extremity edema, dizziness, presyncope, syncope, or neurologic sequela.  Positive for  weakness, shortness of breath with exertion.The patient is tolerating medications without difficulties and is otherwise without complaint today.   Past Medical History:  Diagnosis Date   Aortic atherosclerosis ([place])    CAP (community acquired pneumonia) 03/06/2016   Cardiomyopathy ([place])    a. EF 40-45% in 04/2016, etiology not defined, managed medically.   Chronic combined systolic and diastolic CHF (congestive heart failure) ([facility])    Hyperlipidemia    Hypertension    Morbid obesity ([place])    Myocardial infarction ([place]) 1980-early 2000s X 2   "mild ones" (03/06/2016)   OSA on CPAP    a. pt states was told by Dr. Maxwell Caul that he would not require CPAP as long as he continued to sleep in recliner which he does for his back.   Paroxysmal atrial fibrillation ([facility])    Pneumonia    "when I was a kid" (03/06/2016)   RBBB    Seizures ([place])    "take RX daily" (03/06/2016)   Past Surgical History:  Procedure Laterality Date   CARDIOVERSION N/A 12/13/2017   Procedure: CARDIOVERSION;  Surgeon: [name], MD;  Location: [place];  Service: Cardiovascular;  Laterality: N/A;   CIRCUMCISION     JOINT REPLACEMENT     PERCUTANEOUS PINNING TOE FRACTURE Left    "big toe   REPLACEMENT TOTAL  KNEE Left    TONSILLECTOMY AND ADENOIDECTOMY      Current Outpatient Medications  Medication Sig Dispense Refill   acetaminophen (TYLENOL) 500 MG tablet Take 500-1,000 mg by mouth at bedtime as needed for moderate pain.      Carboxymethylcellul-Glycerin (LUBRICATING EYE DROPS OP) Place 1 drop into both eyes daily as needed (dry eyes).     ENTRESTO 49-51 MG Take 1 tablet by mouth twice daily 180 tablet 3   famotidine (PEPCID) 20 MG tablet One at bedtime 30 tablet 2   fluticasone (FLONASE) 50 MCG/ACT nasal spray Place 2 sprays into both nostrils 2 (two) times daily.     furosemide (LASIX) 20 MG tablet Take 1 tablet (20 mg total) by mouth daily. 90 tablet 3   metoprolol tartrate  (LOPRESSOR) 100 MG tablet Take 1 tablet (100 mg total) by mouth 2 (two) times daily. 60 tablet 11   PHENobarbital (LUMINAL) 97.2 MG tablet Take 97.2 mg by mouth every morning.     polyethylene glycol (MIRALAX / GLYCOLAX) packet Take 17 g by mouth daily as needed. 14 each 0   potassium chloride SA (K-DUR) 10 MEQ tablet Take 1 tablet (10 mEq total) by mouth daily. 90 tablet 3   pravastatin (PRAVACHOL) 40 MG tablet Take 40 mg by mouth at bedtime.      warfarin (COUMADIN) 4 MG tablet Take 4 mg by mouth every other day.      warfarin (COUMADIN) 5 MG tablet Take 1 tablet by mouth every other day. With 4 mg     No current facility-administered medications for this encounter.     Allergies  Allergen Reactions   Dilaudid [Hydromorphone Hcl] Other (See Comments)    Reaction:  Makes pt hyper    Tegretol [Carbamazepine] Hives    Social History   Socioeconomic History   Marital status: Married    Spouse name: Not on file   Number of children: Not on file   Years of education: Not on file   Highest education level: Not on file  Occupational History   Not on file  Social Needs   Financial resource strain: Not on file   Food insecurity    Worry: Not on file    Inability: Not on file   Transportation needs    Medical: Not on file    Non-medical: Not on file  Tobacco Use   Smoking status: Former Smoker    Packs/day: 3.00    Years: 48.00    Pack years: 144.00    Types: Cigarettes    Quit date: 2000    Years since quitting: 20.4   Smokeless tobacco: Never Used  Substance and Sexual Activity   Alcohol use: No   Drug use: No   Sexual activity: Never  Lifestyle   Physical activity    Days per week: Not on file    Minutes per session: Not on file   Stress: Not on file  Relationships   Social connections    Talks on phone: Not on file    Gets together: Not on file    Attends religious service: Not on file    Active member of club or organization: Not on file      Attends meetings of clubs or organizations: Not on file    Relationship status: Not on file   Intimate partner violence    Fear of current or ex partner: Not on file    Emotionally abused: Not on file    Physically abused:  Not on file    Forced sexual activity: Not on file  Other Topics Concern   Not on file  Social History Narrative   Not on file    Family History  Problem Relation Age of Onset   Hypertension Mother    Other Father        sudden cardiac arrest    ROS- All systems are reviewed and negative except as per the HPI above  Physical Exam: There were no vitals filed for this visit. Wt Readings from Last 3 Encounters:  08/20/18 124.3 kg  03/29/18 123 kg  01/10/18 122.9 kg    Labs: Lab Results  Component Value Date   NA 143 08/20/2018   K 4.7 08/20/2018   CL 102 08/20/2018   CO2 26 08/20/2018   GLUCOSE 101 (H) 08/20/2018   BUN 19 08/20/2018   CREATININE 1.08 08/20/2018   CALCIUM 9.1 08/20/2018   PHOS 3.7 10/30/2017   MG 2.1 12/27/2017   Lab Results  Component Value Date   INR 3.6 (H) 12/10/2017   Lab Results  Component Value Date   CHOL 130 11/02/2017   HDL 31 (L) 11/02/2017   LDLCALC 83 11/02/2017   TRIG 81 11/02/2017    GEN- The patient is well appearing obese male, alert and oriented x 3 today.   HEENT-head normocephalic, atraumatic, sclera clear, conjunctiva pink, hearing intact, trachea midline. Lungs- Clear to ausculation bilaterally, normal work of breathing Heart- irregular rate and rhythm, no murmurs, rubs or gallops  GI- soft, NT, ND, + BS Extremities- no clubbing, cyanosis, trace edema MS- no significant deformity or atrophy Skin- no rash or lesion Psych- euthymic mood, full affect Neuro- strength and sensation are intact   EKG- afib HR 103, RBBB, LAD, QRS 140, QTc 487  Epic records reviewed  Echo 03/18/18 - Left ventricle: The cavity size was normal. Wall thickness was   normal. Systolic function was mildly to  moderately reduced. The   estimated ejection fraction was in the range of 40% to 45%. Wall   motion was normal; there were no regional wall motion   abnormalities. Doppler parameters are consistent with abnormal   left ventricular relaxation (grade 1 diastolic dysfunction). - Right ventricle: The cavity size was mildly dilated. - Right atrium: The atrium was mildly dilated. - Pulmonary arteries: Systolic pressure was mildly to moderately   increased. PA peak pressure: 40 mm Hg (S).   Assessment and Plan: 1. Persistent atrial fibrillation   Patient appears to be in persistent atrial fibrillation with symptoms of weakness and SOB. We discussed therapeutic options and patient would like to pursue DCCV at this time.  Continue warfarin with INR followed by PCP. Will request weekly INRs in anticipation of DCCV. Will increase Lopressor to 100 mg AM and 150 mg PM for rate control.  Recall due to presence of phenobarbital will not be able to take eliquis or xarelto due to drug/drug interaction.  This patients CHA2DS2-VASc Score and unadjusted Ischemic Stroke Rate (% per year) is equal to 4.8 % stroke rate/year from a score of 4  Above score calculated as 1 point each if present [CHF, HTN, DM, Vascular=MI/PAD/Aortic Plaque, Age if 65-74, or Male] Above score calculated as 2 points each if present [Age > 75, or Stroke/TIA/TE]   2. OSA Not currently on CPAP. Per patient, Dr Isidoro Donning told him he did not need it if he slept in recliner.   3. Combined chronic systolic and diastolic CHF Patient's weight up from 274  to 283 since 08/20/18. No symptoms of fluid overload. Patient sleeps in recliner for back pain anyway. Will resume Lasix 40 mg daily and K+ 20 meq daily.  On Entresto and BB.   Follow up in AF clinic in 1-2 weeks.   [facility] [address] [place], [place] [address] [phone]

## 2018-08-29 DIAGNOSIS — I4891 Unspecified atrial fibrillation: Secondary | ICD-10-CM | POA: Diagnosis not present

## 2018-08-29 DIAGNOSIS — Z7901 Long term (current) use of anticoagulants: Secondary | ICD-10-CM | POA: Diagnosis not present

## 2018-09-03 DIAGNOSIS — H5212 Myopia, left eye: Secondary | ICD-10-CM | POA: Diagnosis not present

## 2018-09-03 DIAGNOSIS — H524 Presbyopia: Secondary | ICD-10-CM | POA: Diagnosis not present

## 2018-09-03 DIAGNOSIS — H52203 Unspecified astigmatism, bilateral: Secondary | ICD-10-CM | POA: Diagnosis not present

## 2018-09-05 ENCOUNTER — Other Ambulatory Visit: Payer: Self-pay

## 2018-09-05 ENCOUNTER — Ambulatory Visit (HOSPITAL_COMMUNITY)
Admission: RE | Admit: 2018-09-05 | Discharge: 2018-09-05 | Disposition: A | Discharge status: Home / Self Care | Payer: Medicare HMO | Source: Ambulatory Visit | Attending: Physician Assistant | Admitting: Physician Assistant

## 2018-09-05 ENCOUNTER — Encounter (HOSPITAL_COMMUNITY): Payer: Self-pay | Admitting: Physician Assistant

## 2018-09-05 ENCOUNTER — Other Ambulatory Visit: Payer: Self-pay | Admitting: *Deleted

## 2018-09-05 VITALS — BP 108/64 | HR 100 | Ht 68.0 in | Wt 276.0 lb

## 2018-09-05 DIAGNOSIS — Z87891 Personal history of nicotine dependence: Secondary | ICD-10-CM | POA: Insufficient documentation

## 2018-09-05 DIAGNOSIS — Z79899 Other long term (current) drug therapy: Secondary | ICD-10-CM | POA: Insufficient documentation

## 2018-09-05 DIAGNOSIS — Z7901 Long term (current) use of anticoagulants: Secondary | ICD-10-CM | POA: Diagnosis not present

## 2018-09-05 DIAGNOSIS — Z885 Allergy status to narcotic agent status: Secondary | ICD-10-CM | POA: Insufficient documentation

## 2018-09-05 DIAGNOSIS — I252 Old myocardial infarction: Secondary | ICD-10-CM | POA: Insufficient documentation

## 2018-09-05 DIAGNOSIS — Z96652 Presence of left artificial knee joint: Secondary | ICD-10-CM | POA: Diagnosis not present

## 2018-09-05 DIAGNOSIS — E785 Hyperlipidemia, unspecified: Secondary | ICD-10-CM | POA: Insufficient documentation

## 2018-09-05 DIAGNOSIS — Z6841 Body Mass Index (BMI) 40.0 and over, adult: Secondary | ICD-10-CM | POA: Insufficient documentation

## 2018-09-05 DIAGNOSIS — Z888 Allergy status to other drugs, medicaments and biological substances status: Secondary | ICD-10-CM | POA: Insufficient documentation

## 2018-09-05 DIAGNOSIS — I5042 Chronic combined systolic (congestive) and diastolic (congestive) heart failure: Secondary | ICD-10-CM | POA: Insufficient documentation

## 2018-09-05 DIAGNOSIS — R569 Unspecified convulsions: Secondary | ICD-10-CM | POA: Diagnosis not present

## 2018-09-05 DIAGNOSIS — Z8249 Family history of ischemic heart disease and other diseases of the circulatory system: Secondary | ICD-10-CM | POA: Insufficient documentation

## 2018-09-05 DIAGNOSIS — I4819 Other persistent atrial fibrillation: Secondary | ICD-10-CM

## 2018-09-05 DIAGNOSIS — G4733 Obstructive sleep apnea (adult) (pediatric): Secondary | ICD-10-CM | POA: Diagnosis not present

## 2018-09-05 DIAGNOSIS — I4891 Unspecified atrial fibrillation: Secondary | ICD-10-CM | POA: Diagnosis not present

## 2018-09-05 DIAGNOSIS — I110 Hypertensive heart disease with heart failure: Secondary | ICD-10-CM | POA: Insufficient documentation

## 2018-09-05 LAB — BASIC METABOLIC PANEL
Anion gap: 10 (ref 5–15)
BUN: 16 mg/dL (ref 8–23)
CO2: 26 mmol/L (ref 22–32)
Calcium: 8.6 mg/dL — ABNORMAL LOW (ref 8.9–10.3)
Chloride: 105 mmol/L (ref 98–111)
Creatinine, Ser: 1 mg/dL (ref 0.61–1.24)
GFR calc Af Amer: >60 mL/min (ref >60)
GFR calc non Af Amer: >60 mL/min (ref >60)
Glucose, Bld: 98 mg/dL (ref 70–99)
Potassium: 4.3 mmol/L (ref 3.5–5.1)
Sodium: 141 mmol/L (ref 135–145)

## 2018-09-05 LAB — CBC
HCT: 49.5 % (ref 39.0–52.0)
Hemoglobin: 16.3 g/dL (ref 13.0–17.0)
MCH: 31.5 pg (ref 26.0–34.0)
MCHC: 32.9 g/dL (ref 30.0–36.0)
MCV: 95.7 fL (ref 80.0–100.0)
Platelets: 203 10*3/uL (ref 150–400)
RBC: 5.17 MIL/uL (ref 4.22–5.81)
RDW: 13.7 % (ref 11.5–15.5)
WBC: 9.3 10*3/uL (ref 4.0–10.5)
nRBC: 0 % (ref 0.0–0.2)

## 2018-09-05 NOTE — Patient Instructions (Signed)
Have eagle fax INR results to [place] at [phone]  Once we see next week's INR I will call to setup cardioversion.

## 2018-09-05 NOTE — Patient Outreach (Signed)
[facility]) Care Management  09/05/2018  FODAY CONE [birth year] [id number]   RN Health Coach Monthly Outreach  Referral Date:01/18/2018 Referral Source:Transfer from [place] Reason for Referral:Continued Disease Management Education Insurance:[name]   Outreach Attempt:  Outreach attempt #1 to patient for follow up. No answer. RN Health Coach left HIPAA compliant voicemail message along with contact information.  Plan:  RN Health Coach will make another outreach attempt within the month of July.   [place] [phone] [email or web address]

## 2018-09-05 NOTE — Progress Notes (Signed)
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 male with a h/o obesity, htn, untreated sleep apnea, seizures, that had recently been treated at [facility] for CAP 1/1-1/5. He had f/u with his PCP 2/14 and was found to be in new onset afib. Being seen in [place] clinic 2/15.  He had also been treated with prednisone and finished taper last week. He continues to feel weak recovering from the Pneumonia.   He has OSA but has not used cpap in several months. States that it is broken and needs to see if can get a new machine. No alcohol or tobacco. Moderate caffeine use.  Is sedentary and obese. Has a chadsvasc score of at least 3.  F/u afib clinic 2/19, he was started on warfarin 5 mg on Friday and started diltiazem 120 mg qd. He has returned to [place]. He thinks he feels a little better. He has an appointment to see Dr.Osborne to get his cpap problems addressed. Echo is pending.  Being seen afib clinc 05/08/16 for f/u echo. It did show EF of 40-45% and moderate RAE, RVSP 42 mm, mod TR. Pt has seen Dr. Maxwell Caul for treatment of his sleep apnea, which he was not treating and will be placed on auto cpap with a different mask which he is due to pick up soon to start new treatment. He also reports that he got severely constipated over the weekend and may be due to cardizem. He continues in SR. He states that he has had two mild MI's in the past,around 2005.  Follow up in AF clinic 08/26/18. Patient s/p DCCV 12/2017 and has done well since then until last week when he was seen at [facility] for weakness and SOB. He was found to be in afib HR 104. He continues to have symptoms and is in afib today. No triggers that the patient could identify. His weight is also up today but he denies any edema or swelling.   Follow up in AF clinic 09/05/18. Patient reports that his symptoms of SOB and swelling have mildly improved. He is still in  afib today. He has weekly INRs scheduled. In anticipation of DCCV.   Today, he denies symptoms of chest pain,   orthopnea, PND, dizziness, presyncope, syncope, or neurologic sequela.  Positive for weakness, shortness of breath with exertion.The patient is tolerating medications without difficulties and is otherwise without complaint today. +trace edema R>L  Past Medical History:  Diagnosis Date  . Aortic atherosclerosis ([place])   . CAP (community acquired pneumonia) 03/06/2016  . Cardiomyopathy ([place])    a. EF 40-45% in 04/2016, etiology not defined, managed medically.  . Chronic combined systolic and diastolic CHF (congestive heart failure) ([place])   . Hyperlipidemia   . Hypertension   . Morbid obesity ([place])   . Myocardial infarction [facility]) 1980-early 2000s X 2   "mild ones" (03/06/2016)  . OSA on CPAP    a. pt states was told by Dr. Maxwell Caul that he would not require CPAP as long as he continued to sleep in recliner which he does for his back.  . Paroxysmal atrial fibrillation ([place])   . Pneumonia    "when I was a kid" (03/06/2016)  . RBBB   . Seizures ([place])    "take RX daily" (03/06/2016)   Past Surgical History:  Procedure Laterality Date  . CARDIOVERSION N/A 12/13/2017   Procedure: CARDIOVERSION;  Surgeon: [name],  MD;  Location: [place];  Service: Cardiovascular;  Laterality: N/A;  . CIRCUMCISION    . JOINT REPLACEMENT    . PERCUTANEOUS PINNING TOE FRACTURE Left    "big toe  . REPLACEMENT TOTAL KNEE Left   . TONSILLECTOMY AND ADENOIDECTOMY      Current Outpatient Medications  Medication Sig Dispense Refill  . acetaminophen (TYLENOL) 500 MG tablet Take 500-1,000 mg by mouth at bedtime as needed for moderate pain.     . Carboxymethylcellul-Glycerin (LUBRICATING EYE DROPS OP) Place 1 drop into both eyes daily as needed (dry eyes).    Marland Kitchen ENTRESTO 49-51 MG Take 1 tablet by mouth twice daily 180 tablet 3  . famotidine (PEPCID) 20 MG tablet One at bedtime 30 tablet 2  .  finasteride (PROSCAR) 5 MG tablet Take 5 mg by mouth daily.    . fluticasone (FLONASE) 50 MCG/ACT nasal spray Place 2 sprays into both nostrils 2 (two) times daily.    . furosemide (LASIX) 20 MG tablet Take 2 tablets (40 mg total) by mouth daily. 90 tablet 3  . metoprolol tartrate (LOPRESSOR) 100 MG tablet Take 1 tablet in the AM and 1.5 tablets in the PM 60 tablet 11  . PHENobarbital (LUMINAL) 97.2 MG tablet Take 97.2 mg by mouth every morning.    . polyethylene glycol (MIRALAX / GLYCOLAX) packet Take 17 g by mouth daily as needed. 14 each 0  . potassium chloride (K-DUR) 10 MEQ tablet Take 2 tablets (20 mEq total) by mouth daily. 90 tablet 3  . pravastatin (PRAVACHOL) 40 MG tablet Take 40 mg by mouth at bedtime.     Marland Kitchen warfarin (COUMADIN) 4 MG tablet Take 4 mg by mouth every other day.     . warfarin (COUMADIN) 5 MG tablet Take 1 tablet by mouth every other day. With 4 mg     No current facility-administered medications for this encounter.     Allergies  Allergen Reactions  . Dilaudid [Hydromorphone Hcl] Other (See Comments)    Reaction:  Makes pt hyper   . Tegretol [Carbamazepine] Hives    Social History   Socioeconomic History  . Marital status: Married    Spouse name: Not on file  . Number of children: Not on file  . Years of education: Not on file  . Highest education level: Not on file  Occupational History  . Not on file  Social Needs  . Financial resource strain: Not on file  . Food insecurity    Worry: Not on file    Inability: Not on file  . Transportation needs    Medical: Not on file    Non-medical: Not on file  Tobacco Use  . Smoking status: Former Smoker    Packs/day: 3.00    Years: 48.00    Pack years: 144.00    Types: Cigarettes    Quit date: 2000    Years since quitting: 20.5  . Smokeless tobacco: Never Used  Substance and Sexual Activity  . Alcohol use: No  . Drug use: No  . Sexual activity: Never  Lifestyle  . Physical activity    Days per week:  Not on file    Minutes per session: Not on file  . Stress: Not on file  Relationships  . Social Herbalist on phone: Not on file    Gets together: Not on file    Attends religious service: Not on file    Active member of club or organization: Not on  file    Attends meetings of clubs or organizations: Not on file    Relationship status: Not on file  . Intimate partner violence    Fear of current or ex partner: Not on file    Emotionally abused: Not on file    Physically abused: Not on file    Forced sexual activity: Not on file  Other Topics Concern  . Not on file  Social History Narrative  . Not on file    Family History  Problem Relation Age of Onset  . Hypertension Mother   . Other Father        sudden cardiac arrest    ROS- All systems are reviewed and negative except as per the HPI above  Physical Exam: Vitals:   09/05/18 1144  BP: 108/64  Pulse: 100  Weight: 125.2 kg  Height: _0  (1.727 m)   Wt Readings from Last 3 Encounters:  09/05/18 125.2 kg  08/26/18 128.4 kg  08/20/18 124.3 kg    Labs: Lab Results  Component Value Date   NA 143 08/20/2018   K 4.7 08/20/2018   CL 102 08/20/2018   CO2 26 08/20/2018   GLUCOSE 101 (H) 08/20/2018   BUN 19 08/20/2018   CREATININE 1.08 08/20/2018   CALCIUM 9.1 08/20/2018   PHOS 3.7 10/30/2017   MG 2.1 12/27/2017   Lab Results  Component Value Date   INR 3.6 (H) 12/10/2017   Lab Results  Component Value Date   CHOL 130 11/02/2017   HDL 31 (L) 11/02/2017   LDLCALC 83 11/02/2017   TRIG 81 11/02/2017    GEN- The patient is well appearing obese, elderly male, alert and oriented x 3 today.   HEENT-head normocephalic, atraumatic, sclera clear, conjunctiva pink, hearing intact, trachea midline. Lungs- Clear to ausculation bilaterally, normal work of breathing Heart- irregular rate and rhythm, no murmurs, rubs or gallops  GI- soft, NT, ND, + BS Extremities- no clubbing, cyanosis. Trace edema R>L MS-  no significant deformity or atrophy Skin- no rash or lesion Psych- euthymic mood, full affect Neuro- strength and sensation are intact   EKG- afib HR 100, LAD, RBBB, QRS 136, QTc 495  Epic records reviewed  Echo 03/18/18 - Left ventricle: The cavity size was normal. Wall thickness was   normal. Systolic function was mildly to moderately reduced. The   estimated ejection fraction was in the range of 40% to 45%. Wall   motion was normal; there were no regional wall motion   abnormalities. Doppler parameters are consistent with abnormal   left ventricular relaxation (grade 1 diastolic dysfunction). - Right ventricle: The cavity size was mildly dilated. - Right atrium: The atrium was mildly dilated. - Pulmonary arteries: Systolic pressure was mildly to moderately   increased. PA peak pressure: 40 mm Hg (S).   Assessment and Plan: 1. Persistent atrial fibrillation   Patient in persistent atrial fibrillation with symptoms of weakness and SOB. Continue warfarin with weekly INR followed by PCP. Last INR prior to DCCV will be 09/19/18. Will request records of these labs. Continue Lopressor to 100 mg AM and 150 mg PM  Recall due to presence of phenobarbital will not be able to take eliquis or xarelto due to drug/drug interaction. Bmet/CBC today.  This patients CHA2DS2-VASc Score and unadjusted Ischemic Stroke Rate (% per year) is equal to 4.8 % stroke rate/year from a score of 4  Above score calculated as 1 point each if present [CHF, HTN, DM, Vascular=MI/PAD/Aortic Plaque, Age if  65-74, or Male] Above score calculated as 2 points each if present [Age > 75, or Stroke/TIA/TE]   2. OSA Not currently on CPAP. Per patient, Dr Isidoro Donning told him he did not need it if he slept in recliner.   3. Combined chronic systolic and diastolic CHF Patient's weight back down to 276 which is close to his dry weight. No symptoms of fluid overload. Patient sleeps in recliner for back pain anyway. On  Entresto, BB, Lasix, and K+. Bmet as above.   Follow up in the AF clinic one week post DCCV.   [facility] [address] [place], [place] [address] [phone]

## 2018-09-12 ENCOUNTER — Other Ambulatory Visit (HOSPITAL_COMMUNITY): Payer: Self-pay | Admitting: *Deleted

## 2018-09-12 ENCOUNTER — Telehealth: Payer: Self-pay | Admitting: Cardiology

## 2018-09-12 DIAGNOSIS — I4891 Unspecified atrial fibrillation: Secondary | ICD-10-CM | POA: Diagnosis not present

## 2018-09-12 DIAGNOSIS — Z7901 Long term (current) use of anticoagulants: Secondary | ICD-10-CM | POA: Diagnosis not present

## 2018-09-12 NOTE — Telephone Encounter (Signed)
  Patient called Ruben Murray because he says he was told he had to have a PT done before his procedure on 09/25/18. He said he did not know if it was to be done at [facility] or in our office. Please call Tamyra at [facility] and ask for her specifically and let her know if they need to make him an appt.

## 2018-09-12 NOTE — Telephone Encounter (Signed)
Attempted to return call but office was closed.  Will forward to afib clinic for follow up

## 2018-09-13 NOTE — Telephone Encounter (Signed)
Talked with Ruben Murray pt will have INR at [facility] prior to coming for dccv

## 2018-09-19 DIAGNOSIS — I4891 Unspecified atrial fibrillation: Secondary | ICD-10-CM | POA: Diagnosis not present

## 2018-09-19 DIAGNOSIS — Z7901 Long term (current) use of anticoagulants: Secondary | ICD-10-CM | POA: Diagnosis not present

## 2018-09-21 ENCOUNTER — Other Ambulatory Visit (HOSPITAL_COMMUNITY): Payer: Medicare HMO

## 2018-09-23 ENCOUNTER — Telehealth (HOSPITAL_COMMUNITY): Payer: Self-pay | Admitting: *Deleted

## 2018-09-23 ENCOUNTER — Other Ambulatory Visit (HOSPITAL_COMMUNITY)
Admission: RE | Admit: 2018-09-23 | Discharge: 2018-09-23 | Disposition: A | Discharge status: Home / Self Care | Payer: Medicare HMO | Source: Ambulatory Visit | Attending: Internal Medicine | Admitting: Internal Medicine

## 2018-09-23 DIAGNOSIS — Z1159 Encounter for screening for other viral diseases: Secondary | ICD-10-CM | POA: Diagnosis not present

## 2018-09-23 NOTE — Telephone Encounter (Signed)
Pt cld to advise that he missed covid testing on Saturday after being told that he cld be tested on that day.  Pt showed up at 1:00 and they were closed.  Security checked with a nurse that stated that they were not doing covid checks on Saturdays at all and that he would need to come back Monday.  Pt was advised that they have been doing checks on Saturday until 12:00.  Pt was advised this can have a 72 hour turn around time but could be resulted sooner pre procedure.  Pt wanted to know if this would make a change in his procedure.  Pt showed up at 1:00 although he was scheduled for 10:45.  Will check status with ENDO

## 2018-09-24 ENCOUNTER — Ambulatory Visit: Payer: Self-pay | Admitting: *Deleted

## 2018-09-24 LAB — SARS CORONAVIRUS 2 (TAT 6-24 HRS): SARS Coronavirus 2: NEGATIVE

## 2018-09-24 NOTE — Progress Notes (Signed)
Pre-op call made for cardioversion on 09/25/18 to confirm patient has remained quarantined since COVID test and that he has not been around anyone sick, nor has he felt sick himself. All questions/concerns addressed.

## 2018-09-25 ENCOUNTER — Encounter (HOSPITAL_COMMUNITY): Payer: Self-pay | Admitting: *Deleted

## 2018-09-25 ENCOUNTER — Encounter (HOSPITAL_COMMUNITY)
Admission: RE | Disposition: A | Discharge status: Home / Self Care | Payer: Medicare HMO | Source: Home / Self Care | Attending: Internal Medicine

## 2018-09-25 ENCOUNTER — Ambulatory Visit (HOSPITAL_COMMUNITY)
Admission: RE | Admit: 2018-09-25 | Discharge: 2018-09-25 | Disposition: A | Discharge status: Home / Self Care | Payer: Medicare HMO | Attending: Internal Medicine | Admitting: Internal Medicine

## 2018-09-25 ENCOUNTER — Ambulatory Visit (HOSPITAL_COMMUNITY): Payer: Medicare HMO | Admitting: Certified Registered"

## 2018-09-25 ENCOUNTER — Other Ambulatory Visit: Payer: Self-pay

## 2018-09-25 ENCOUNTER — Telehealth (HOSPITAL_COMMUNITY): Payer: Self-pay | Admitting: *Deleted

## 2018-09-25 DIAGNOSIS — Z885 Allergy status to narcotic agent status: Secondary | ICD-10-CM | POA: Insufficient documentation

## 2018-09-25 DIAGNOSIS — I4819 Other persistent atrial fibrillation: Secondary | ICD-10-CM

## 2018-09-25 DIAGNOSIS — I428 Other cardiomyopathies: Secondary | ICD-10-CM | POA: Insufficient documentation

## 2018-09-25 DIAGNOSIS — I4891 Unspecified atrial fibrillation: Secondary | ICD-10-CM | POA: Insufficient documentation

## 2018-09-25 DIAGNOSIS — I5042 Chronic combined systolic (congestive) and diastolic (congestive) heart failure: Secondary | ICD-10-CM | POA: Diagnosis not present

## 2018-09-25 DIAGNOSIS — R569 Unspecified convulsions: Secondary | ICD-10-CM | POA: Insufficient documentation

## 2018-09-25 DIAGNOSIS — E785 Hyperlipidemia, unspecified: Secondary | ICD-10-CM | POA: Diagnosis not present

## 2018-09-25 DIAGNOSIS — Z79899 Other long term (current) drug therapy: Secondary | ICD-10-CM | POA: Insufficient documentation

## 2018-09-25 DIAGNOSIS — Z6838 Body mass index (BMI) 38.0-38.9, adult: Secondary | ICD-10-CM | POA: Insufficient documentation

## 2018-09-25 DIAGNOSIS — Z96652 Presence of left artificial knee joint: Secondary | ICD-10-CM | POA: Diagnosis not present

## 2018-09-25 DIAGNOSIS — Z87891 Personal history of nicotine dependence: Secondary | ICD-10-CM | POA: Diagnosis not present

## 2018-09-25 DIAGNOSIS — I110 Hypertensive heart disease with heart failure: Secondary | ICD-10-CM | POA: Diagnosis not present

## 2018-09-25 DIAGNOSIS — Z7901 Long term (current) use of anticoagulants: Secondary | ICD-10-CM | POA: Diagnosis not present

## 2018-09-25 DIAGNOSIS — I5043 Acute on chronic combined systolic (congestive) and diastolic (congestive) heart failure: Secondary | ICD-10-CM | POA: Diagnosis not present

## 2018-09-25 DIAGNOSIS — G4733 Obstructive sleep apnea (adult) (pediatric): Secondary | ICD-10-CM | POA: Diagnosis not present

## 2018-09-25 DIAGNOSIS — I252 Old myocardial infarction: Secondary | ICD-10-CM | POA: Diagnosis not present

## 2018-09-25 DIAGNOSIS — Z8249 Family history of ischemic heart disease and other diseases of the circulatory system: Secondary | ICD-10-CM | POA: Diagnosis not present

## 2018-09-25 HISTORY — PX: CARDIOVERSION: SHX1299

## 2018-09-25 SURGERY — CARDIOVERSION
Anesthesia: General

## 2018-09-25 MED ORDER — SODIUM CHLORIDE 0.9 % IV SOLN
INTRAVENOUS | Status: DC | PRN
  Administered 2018-09-25: 11:00:00 via INTRAVENOUS

## 2018-09-25 MED ORDER — LIDOCAINE 2% (20 MG/ML) 5 ML SYRINGE
INTRAMUSCULAR | Status: DC | PRN
  Administered 2018-09-25: 80 mg via INTRAVENOUS

## 2018-09-25 MED ORDER — PROPOFOL 10 MG/ML IV BOLUS
INTRAVENOUS | Status: DC | PRN
  Administered 2018-09-25: 70 mg via INTRAVENOUS

## 2018-09-25 MED ORDER — SODIUM CHLORIDE 0.9 % IV SOLN
INTRAVENOUS | Status: AC | PRN
  Administered 2018-09-25: 500 mL via INTRAVENOUS

## 2018-09-25 NOTE — CV Procedure (Signed)
CARDIOVERSION NOTE  Procedure: Electrical Cardioversion Indications:  Atrial Fibrillation  Procedure Details:  Consent: Risks of procedure as well as the alternatives and risks of each were explained to the (patient/caregiver).  Consent for procedure obtained.  Time Out: Verified patient identification, verified procedure, site/side was marked, verified correct patient position, special equipment/implants available, medications/allergies/relevent history reviewed, required imaging and test results available.  Performed  Patient placed on cardiac monitor, pulse oximetry, supplemental oxygen as necessary.  Sedation given: propofol per anesthesia Pacer pads placed anterior and posterior chest.  Cardioverted 3 time(s).  Cardioverted at 150J and 200J x 2 biphasic.  Impression: Findings: Post procedure EKG shows: Atrial Fibrillation Complications: None Patient did tolerate procedure well.  Plan: 1. Unsuccessful DCCV after 3 stacked shocks - no evidence of even brief return to sinus rhythm. May need to consider AAD therapy.  2. Follow-up in afib clinic.  Time Spent Directly with the Patient:  30 minutes   [name], MD, [facility], [place] Director of the Advanced Lipid Disorders &  Cardiovascular Risk Reduction Clinic Diplomate of the American Board of Clinical Lipidology Attending Cardiologist  Direct Dial: [phone]  Fax: [phone]  Website:  [email or web address] 09/25/2018, 11:14 AM

## 2018-09-25 NOTE — Anesthesia Preprocedure Evaluation (Addendum)
Anesthesia Evaluation  Patient identified by MRN, date of birth, ID band Patient awake    Reviewed: Allergy & Precautions, NPO status , Patient's Chart, lab work & pertinent test results, reviewed documented beta blocker date and time   Airway Mallampati: III  TM Distance: >3 FB Neck ROM: Full    Dental no notable dental hx. (+) Dental Advisory Given, Edentulous Upper, Upper Dentures, Partial Lower   Pulmonary asthma , sleep apnea , COPD, former smoker,    Pulmonary exam normal breath sounds clear to auscultation       Cardiovascular hypertension, Pt. on medications and Pt. on home beta blockers + Past MI and +CHF  Normal cardiovascular exam+ dysrhythmias Atrial Fibrillation  Rhythm:Regular Rate:Normal  EKG RBBB  TTE 03/2018 EF 40-45%, G1DD, no significant valvular abnormalities   Neuro/Psych Seizures -,  negative psych ROS   GI/Hepatic negative GI ROS, Neg liver ROS,   Endo/Other  Morbid obesity  Renal/GU negative Renal ROS  negative genitourinary   Musculoskeletal negative musculoskeletal ROS (+)   Abdominal   Peds  Hematology  (+) Blood dyscrasia (on coumadin), ,   Anesthesia Other Findings   Reproductive/Obstetrics                            Anesthesia Physical Anesthesia Plan  ASA: III  Anesthesia Plan: General   Post-op Pain Management:    Induction: Intravenous  PONV Risk Score and Plan: 2 and Propofol infusion and Treatment may vary due to age or medical condition  Airway Management Planned: Mask and Natural Airway  Additional Equipment:   Intra-op Plan:   Post-operative Plan:   Informed Consent: I have reviewed the patients History and Physical, chart, labs and discussed the procedure including the risks, benefits and alternatives for the proposed anesthesia with the patient or authorized representative who has indicated his/her understanding and acceptance.      Dental advisory given  Plan Discussed with: CRNA  Anesthesia Plan Comments:         Anesthesia Quick Evaluation

## 2018-09-25 NOTE — Transfer of Care (Signed)
Immediate Anesthesia Transfer of Care Note  Patient: Ruben Murray  Procedure(s) Performed: CARDIOVERSION (N/A )  Patient Location: Endoscopy Unit  Anesthesia Type:General  Level of Consciousness: drowsy  Airway & Oxygen Therapy: Patient Spontanous Breathing and Patient connected to face mask oxygen  Post-op Assessment: Report given to RN and Post -op Vital signs reviewed and stable  Post vital signs: Reviewed and stable  Last Vitals:  Vitals Value Taken Time  BP    Temp    Pulse    Resp    SpO2      Last Pain:  Vitals:   09/25/18 1040  TempSrc: Temporal  PainSc: 0-No pain         Complications: No apparent anesthesia complications

## 2018-09-25 NOTE — H&P (Signed)
   INTERVAL PROCEDURE H&P  History and Physical Interval Note:  09/25/2018 10:46 AM   INR values from PCP personally reviewed in [place] and demonstrate consistent therapeutic values since January of 2020.  Ruben Murray has presented today for their planned procedure. The various methods of treatment have been discussed with the patient and family. After consideration of risks, benefits and other options for treatment, the patient has consented to the procedure.  The patients' outpatient history has been reviewed, patient examined, and no change in status from most recent office note within the past 30 days. I have reviewed the patients' chart and labs and will proceed as planned. Questions were answered to the patient's satisfaction.   [name], MD, [facility], [place] Director of the Advanced Lipid Disorders &  Cardiovascular Risk Reduction Clinic Diplomate of the American Board of Clinical Lipidology Attending Cardiologist  Direct Dial: [phone]  Fax: [phone]  Website:  [name] 09/25/2018, 10:46 AM

## 2018-09-25 NOTE — Anesthesia Procedure Notes (Signed)
Procedure Name: General with mask airway Date/Time: 09/25/2018 11:10 AM Performed by: Orlie Dakin, CRNA Pre-anesthesia Checklist: Patient identified, Emergency Drugs available, Suction available and Patient being monitored Patient Re-evaluated:Patient Re-evaluated prior to induction Oxygen Delivery Method: Ambu bag Preoxygenation: Pre-oxygenation with 100% oxygen Induction Type: IV induction

## 2018-09-25 NOTE — Telephone Encounter (Signed)
INR 6/25 - 2.6 INR 7/2 - 2.9 INR 7/9 - 2.4 INR 7/17 - 2.4 INR 7/22 - 2.2  For cardioversion today.

## 2018-09-25 NOTE — Discharge Instructions (Signed)

## 2018-09-26 ENCOUNTER — Telehealth: Payer: Self-pay | Admitting: Cardiology

## 2018-09-26 ENCOUNTER — Other Ambulatory Visit: Payer: Self-pay | Admitting: *Deleted

## 2018-09-26 ENCOUNTER — Encounter (HOSPITAL_COMMUNITY): Payer: Self-pay | Admitting: Internal Medicine

## 2018-09-26 NOTE — Patient Outreach (Signed)
[facility]) Care Management  [place]  09/26/2018   Ruben Murray 03-Feb-1946 [id number]   [place] Quarterly Outreach   Referral Date:  01/18/2018 Referral Source:  Transfer from [place] Reason for Referral:  Continued Disease Management Education Insurance:  [facility] Medicare   Outreach Attempt:  Successful telephone outreach to patient for follow up.  HIPAA verified with patient.  Patient reporting unsuccessful Cardioversion procedure yesterday, 09/25/2018.  Denies any burns or rashes on chest.  Reports he is extremely weak while his heart is out of rhythm.  Uses his cane to assist with weakness and help prevent falls.  Also, endorsing increase shortness of breath with heart out of rhythm.  Continues to weigh daily.  Weight this morning was 271 pounds (normal range 268-271 pounds).  Verbalizes he continues to sleep in a recliner.  Reporting small amounts of lower extremity edema bilaterally with left worse than right.  States he is awaiting Cardiologist plan for his continued atrial fibrillation and has appointment tomorrow.   Encounter Medications:  Outpatient Encounter Medications as of 09/26/2018  Medication Sig Note  . acetaminophen (TYLENOL) 500 MG tablet Take 500-1,000 mg by mouth daily as needed for moderate pain.    . Carboxymethylcellul-Glycerin (LUBRICATING EYE DROPS OP) Place 1 drop into both eyes daily as needed (dry eyes).   Marland Kitchen ENTRESTO 49-51 MG Take 1 tablet by mouth twice daily (Patient taking differently: Take 1 tablet by mouth 2 (two) times a day. )   . famotidine (PEPCID) 20 MG tablet One at bedtime (Patient taking differently: Take 20 mg by mouth at bedtime. )   . finasteride (PROSCAR) 5 MG tablet Take 5 mg by mouth daily.   . fluticasone (FLONASE) 50 MCG/ACT nasal spray Place 2 sprays into both nostrils 2 (two) times daily.   . furosemide (LASIX) 40 MG tablet Take 40 mg by mouth daily.   . Melatonin 10 MG TABS Take 10 mg  by mouth at bedtime as needed (sleep).   . metoprolol tartrate (LOPRESSOR) 100 MG tablet Take 1 tablet in the AM and 1.5 tablets in the PM (Patient taking differently: Take 100-150 mg by mouth See admin instructions. Take 100 mg in the morning and 150 mg at night)   . Multiple Vitamin (MULTIVITAMIN WITH MINERALS) TABS tablet Take 1 tablet by mouth daily.   Marland Kitchen PHENobarbital (LUMINAL) 97.2 MG tablet Take 97.2 mg by mouth every morning.   . polyethylene glycol (MIRALAX / GLYCOLAX) packet Take 17 g by mouth daily as needed. (Patient taking differently: Take 17 g by mouth daily as needed for mild constipation. )   . potassium chloride (KLOR-CON) 20 MEQ packet Take 20 mEq by mouth daily.   . pravastatin (PRAVACHOL) 40 MG tablet Take 40 mg by mouth at bedtime.    Marland Kitchen warfarin (COUMADIN) 4 MG tablet Take 4 mg by mouth every other day. Alternate with 5 mg dose   . warfarin (COUMADIN) 5 MG tablet Take 5 mg by mouth every other day. Alternate day with 4 mg dose   . furosemide (LASIX) 20 MG tablet Take 2 tablets (40 mg total) by mouth daily. (Patient not taking: Reported on 06/05/270) 5/36/6440: Duplicate   . potassium chloride (K-DUR) 10 MEQ tablet Take 2 tablets (20 mEq total) by mouth daily. (Patient not taking: Reported on 09/19/2018)    No facility-administered encounter medications on file as of 09/26/2018.     Functional Status:  In your present state of health, do  you have any difficulty performing the following activities: 06/07/2018 11/15/2017  Hearing? Y N  Comment trouble hearing -  Vision? N N  Difficulty concentrating or making decisions? N N  Walking or climbing stairs? [name]  Comment difficulties climbing stairs due to knees -  Dressing or bathing? N N  Doing errands, shopping? N Y  Conservation officer, nature and eating ? N N  Using the Toilet? N N  In the past six months, have you accidently leaked urine? N Y  Do you have problems with loss of bowel control? N N  Managing your Medications? [name]  Comment  wife assist with management of medications -  Managing your Finances? N Y  Housekeeping or managing your Housekeeping? N Y  Some recent data might be hidden    Fall/Depression Screening: Fall Risk  09/26/2018 06/07/2018 02/18/2018  Falls in the past year? 0 0 1  Number falls in past yr: - - 0  Injury with Fall? - - 0  Risk for fall due to : Medication side effect;Impaired balance/gait;Impaired mobility Impaired vision;Medication side effect;Impaired balance/gait;Impaired mobility;History of fall(s) History of fall(s);Impaired vision;Medication side effect;Impaired balance/gait;Impaired mobility  Follow up Falls evaluation completed;Education provided;Falls prevention discussed Falls evaluation completed;Falls prevention discussed;Education provided Education provided;Falls prevention discussed   PHQ 2/9 Scores 06/07/2018 02/18/2018 11/15/2017  PHQ - 2 Score 0 1 0   TH[facility] CM Care Plan Problem One     Most Recent Value  Care Plan Problem One  Continued learning related to diagnosis of heart failure and advance directives.  Role Documenting the Problem One  [place] for Problem One  Active  [facility] Long Term Goal   Patient will report less feeling of weakness related to atrial fibrillation within the next 90 days.  [facility] Long Term Goal Start Date  09/26/18  [facility] Long Term Goal Met Date  09/26/18  Interventions for Problem One Long Term Goal  Current care plan reviewed and discussed, encouraged to keep and attend scheduled medical appointments, reviewed medications and encouraged medication compliance, encouraged to use cane with all ambulation while weak from being in atrial fibrillation, confirmed patient has no rash or burn from cardioversion yesterday, reviewed signs and symptoms of atrial fibrillation as well as heart failure, encouraged to continue to monitor daily weights and to notify provider for increases in weight or increase in lower extremity edema or increase in shortness of  breath, reviewed heart failure action plan and when to call physician  [facility] CM Short Term Goal #1   Patient will discuss DNR wishes with primary care provider in the next 90 days.  THN CM Short Term Goal #1 Start Date  09/26/18  Interventions for Short Term Goal #1  Rediscussed advance directives with patient and explained difference between Living Will and out of facility DNR, encouraged patient to discuss out of facility DNR or MOST form with primary care provider, discussed requirements of EMS if these forms are not in place     Appointments:  Patient reporting he has scheduled appointment with Dr. Alyson Ingles, primary care provider on 10/07/2018 and Cecilie Kicks NP with Cardiology on 09/27/2018.  Encouraged to keep and attend medical appointments.  Plan: RN Health Coach will send primary care provider quarterly update. RN Health Coach will make next telephone outreach to patient within the month of August.  Adolphe Fortunato RN [place] [phone] Hilliard Borges.Maryann Mccall_0 .Patsi Sears

## 2018-09-26 NOTE — Progress Notes (Signed)
Cardiology Office Note   Date:  09/27/2018   ID:  Ruben Murray, DOB 10-22-1945, MRN [id number]  PCP:  [name], MD  Cardiologist:  Dr. [name]     Chief Complaint  Patient presents with  . Shortness of Breath  . Atrial Fibrillation      History of Present Illness: Ruben Murray is a 73 y.o. male who presents for follow up for CHF.   He has a h/o obesity, htn, untreated sleep apnea, seizures, that had recently been treated at [facility] for CAP 1/1-1/5. He had f/u with his PCP 2/14 and was found to be in new onset afib. Being seen in [place] clinic 2/15.  He had also been treated with prednisone and finished taper.l He continues to feel weak recovering from the Pneumonia. a past medical history significant for cardiomyopathy, CHF, CAD with remote MI but normal cath 2003, paroxysmal atrial fibrillation on coumadin, hypertension, hyperlipidemia, RBBB, seizures, morbid obesity. EF had previously been 25%, up to 40-45% in 03/2018 on treatment with Entresto. He had DCCV for afib in 12/2017.   He has OSA but has not used cpap in several months. States that it is broken and needs to see if can get a new machine. No alcohol or tobacco. Moderate caffeine use.  Is sedentary and obese. Has a chadsvasc score of at least 3.  F/u afib clinic 2/19, he was started on warfarin 5 mg on Friday and started diltiazem 120 mg qd. He has returned to [place]. He thinks he feels a little better. He has an appointment to see Dr.Osborne to get his cpap problems addressed. Echo is pending.  Being seen afib clinc 05/08/16 for f/u echo. It did show EF of 40-45% and moderate RAE, RVSP 42 mm, mod TR. Pt has seen Dr. Maxwell Caul for treatment of his sleep apnea, which he was not treating and will be placed on auto cpap with a different mask which he is due to pick up soon to start new treatment. He also reports that he got severely constipated over the weekend and may be due to cardizem. He continues in SR. He states that he has  had two mild MI's in the past,around 2005.  Follow up in AF clinic Patient s/p DCCV 12/2017 and has done well since then until June when he was seen for weakness and SOB. He was found to be in afib HR 104.  It was thought his weakness was due to dehydration so lasix decreased to 20 mg daily.   He tells me his edema returned quickly so he increased the lasix back to 40 .   Follow up in AF clinic 09/05/18. Patient reports that his symptoms of SOB and swelling have mildly improved. He was still in afib today. He has weekly INRs scheduled. In anticipation of DCCV.   Pt was scheduled for DCCV with a fib clinic. Had that done 09/26/18.  It was unsuccessful after 3 shocks.  To follow up in a fib clinic.  Today he is DOE - uses cane to walk.  His HR is elevated at 108 and in a fib.  He is weak and stated he felt bad in a fib.  He is on lopressor 100 in AM and 150 in PM.  BP stable.  No bleeding on coumadin.  No chest pain.    Past Medical History:  Diagnosis Date  . Aortic atherosclerosis ([place])   . CAP (community acquired pneumonia) 03/06/2016  . Cardiomyopathy ([place])  a. EF 40-45% in 04/2016, etiology not defined, managed medically.  . Chronic combined systolic and diastolic CHF (congestive heart failure) ([place])   . Hyperlipidemia   . Hypertension   . Morbid obesity ([place])   . Myocardial infarction [facility]) 1980-early 2000s X 2   "mild ones" (03/06/2016)  . OSA on CPAP    a. pt states was told by Dr. Maxwell Caul that he would not require CPAP as long as he continued to sleep in recliner which he does for his back.  . Paroxysmal atrial fibrillation ([place])   . Pneumonia    "when I was a kid" (03/06/2016)  . RBBB   . Seizures ([place])    "take RX daily" (03/06/2016)    Past Surgical History:  Procedure Laterality Date  . CARDIOVERSION N/A 12/13/2017   Procedure: CARDIOVERSION;  Surgeon: [name], MD;  Location: [place];  Service: Cardiovascular;  Laterality: N/A;  . CARDIOVERSION N/A 09/25/2018    Procedure: CARDIOVERSION;  Surgeon: [name], MD;  Location: [facility] ENDOSCOPY;  Service: Cardiovascular;  Laterality: N/A;  . CIRCUMCISION    . JOINT REPLACEMENT    . PERCUTANEOUS PINNING TOE FRACTURE Left    "big toe  . REPLACEMENT TOTAL KNEE Left   . TONSILLECTOMY AND ADENOIDECTOMY       Current Outpatient Medications  Medication Sig Dispense Refill  . acetaminophen (TYLENOL) 500 MG tablet Take 500-1,000 mg by mouth daily as needed for moderate pain.     . Carboxymethylcellul-Glycerin (LUBRICATING EYE DROPS OP) Place 1 drop into both eyes daily as needed (dry eyes).    Marland Kitchen ENTRESTO 49-51 MG Take 1 tablet by mouth twice daily 180 tablet 3  . famotidine (PEPCID) 20 MG tablet One at bedtime 30 tablet 2  . finasteride (PROSCAR) 5 MG tablet Take 5 mg by mouth daily.    . fluticasone (FLONASE) 50 MCG/ACT nasal spray Place 2 sprays into both nostrils 2 (two) times daily.    . furosemide (LASIX) 20 MG tablet Take 2 tablets (40 mg total) by mouth daily. 90 tablet 3  . Melatonin 10 MG TABS Take 10 mg by mouth at bedtime as needed (sleep).    . metoprolol tartrate (LOPRESSOR) 100 MG tablet Take 1 tablet in the AM and 1.5 tablets in the PM 60 tablet 11  . Multiple Vitamin (MULTIVITAMIN WITH MINERALS) TABS tablet Take 1 tablet by mouth daily.    Marland Kitchen PHENobarbital (LUMINAL) 97.2 MG tablet Take 97.2 mg by mouth every morning.    . polyethylene glycol (MIRALAX / GLYCOLAX) packet Take 17 g by mouth daily as needed. 14 each 0  . potassium chloride (K-DUR) 10 MEQ tablet Take 2 tablets (20 mEq total) by mouth daily. 90 tablet 3  . potassium chloride (KLOR-CON) 20 MEQ packet Take 20 mEq by mouth daily.    . pravastatin (PRAVACHOL) 40 MG tablet Take 40 mg by mouth at bedtime.     Marland Kitchen warfarin (COUMADIN) 4 MG tablet Take 4 mg by mouth every other day. Alternate with 5 mg dose    . warfarin (COUMADIN) 5 MG tablet Take 5 mg by mouth every other day. Alternate day with 4 mg dose     No current  facility-administered medications for this visit.     Allergies:   Dilaudid [hydromorphone hcl] and Tegretol [carbamazepine]    Social History:  The patient  reports that he quit smoking about 20 years ago. His smoking use included cigarettes. He has a 144.00 pack-year smoking history. He has  never used smokeless tobacco. He reports that he does not drink alcohol or use drugs.   Family History:  The patient's family history includes Hypertension in his mother; Other in his father.    ROS:  General:no colds or fevers, no weight changes though he stated wt was 275 which seems more likely.  Skin:no rashes or ulcers HEENT:no blurred vision, no congestion CV:see HPI PUL:see HPI GI:no diarrhea constipation or melena, no indigestion GU:no hematuria, no dysuria MS:no joint pain, no claudication Neuro:no syncope, no lightheadedness, no seizures Endo:no diabetes, no thyroid disease  Wt Readings from Last 3 Encounters:  09/27/18 257 lb (116.6 kg)  09/25/18 250 lb (113.4 kg)  09/05/18 276 lb (125.2 kg)     PHYSICAL EXAM: VS:  BP 122/62   Pulse (!) 108   Ht _0  (1.727 m)   Wt 257 lb (116.6 kg)   BMI 39.08 kg/m  , BMI Body mass index is 39.08 kg/m. General:Pleasant affect, NAD Skin:Warm and dry, brisk capillary refill HEENT:normocephalic, sclera clear, mucus membranes moist Neck:supple, no JVD, no bruits  Heart:Irreg irreg without murmur, gallup, rub or click Lungs:clear without rales, rhonchi, or wheezes GUY:QIHK, non tender, + BS, do not palpate liver spleen or masses Ext:no lower ext edema, 2+ pedal pulses, 2+ radial pulses Neuro:alert and oriented X 3, MAE, follows commands, + facial symmetry    EKG:  EKG is ordered today. The ekg ordered today demonstrates a fib with RVR at 108 LAD and RBBB.  No acute ST changes.     Recent Labs: 10/29/2017: B Natriuretic Peptide 539.2 10/30/2017: ALT 19; TSH 1.469 12/27/2017: Magnesium 2.1 08/20/2018: NT-Pro BNP 433 09/05/2018: BUN 16;  Creatinine, Ser 1.00; Hemoglobin 16.3; Platelets 203; Potassium 4.3; Sodium 141    Lipid Panel    Component Value Date/Time   CHOL 130 11/02/2017 0425   TRIG 81 11/02/2017 0425   HDL 31 (L) 11/02/2017 0425   CHOLHDL 4.2 11/02/2017 0425   VLDL 16 11/02/2017 0425   LDLCALC 83 11/02/2017 0425       Other studies Reviewed: Additional studies/ records that were reviewed today include: attempted DCCV  3 shocks - unsuccessful. .  Echo 03/29/18 Study Conclusions  - Left ventricle: The cavity size was normal. Wall thickness was   normal. Systolic function was mildly to moderately reduced. The   estimated ejection fraction was in the range of 40% to 45%. Wall   motion was normal; there were no regional wall motion   abnormalities. Doppler parameters are consistent with abnormal   left ventricular relaxation (grade 1 diastolic dysfunction). - Right ventricle: The cavity size was mildly dilated. - Right atrium: The atrium was mildly dilated. - Pulmonary arteries: Systolic pressure was mildly to moderately   increased. PA peak pressure: 40 mm Hg (S). Normal Lt atrium  ASSESSMENT AND PLAN:  1.  Chronic combined systolic and diastolic HF stable continue lasix 40 mg daily, will check BMP today. EF improved   2.  Dilated cardiomyopathy. Improved  3.  Persistent a fib, failed DCCV and today RVR at 108, pt is symptomatic - + weakness- increased loperssor to 150 BID and will have him seen back in a fib clinic for antiarrythmic.    4.  HTN controled   5.  CAD without angina.  Last cath stable in 2003.    6.  HLD on Pravachol.    7.  Anticoagulation on coumadin followed by PCP   Current medicines are reviewed with the patient today.  The  patient Has no concerns regarding medicines.  The following changes have been made:  See above Labs/ tests ordered today include:see above  Disposition:   FU:  see above  Signed, Cecilie Kicks, NP  09/27/2018 10:31 AM    [facility], [place], [place], [place] Phone: [phone]; Fax: [phone]

## 2018-09-26 NOTE — Telephone Encounter (Signed)

## 2018-09-26 NOTE — Telephone Encounter (Signed)
New Message         COVID-19 Pre-Screening Questions:   In the past 7 to 10 days have you had a cough,  shortness of breath, headache, congestion, fever (100 or greater) body aches, chills, sore throat, or sudden loss of taste or sense of smell? NO  Have you been around anyone with known Covid 19. NO  Have you been around anyone who is awaiting Covid 19 test results in the past 7 to 10 days? Pt was tested on Monday and was NEG  Have you been around anyone who has been exposed to Covid 19, or has mentioned symptoms of Covid 19 within the past 7 to 10 days? NO  If you have any concerns/questions about symptoms patients report during screening (either on the phone or at [place]). Contact the provider seeing the patient or DOD for further guidance.  If neither are available contact a member of the leadership team.

## 2018-09-27 ENCOUNTER — Encounter: Payer: Self-pay | Admitting: Cardiology

## 2018-09-27 ENCOUNTER — Other Ambulatory Visit: Payer: Self-pay

## 2018-09-27 ENCOUNTER — Ambulatory Visit (INDEPENDENT_AMBULATORY_CARE_PROVIDER_SITE_OTHER): Payer: Medicare HMO | Admitting: Cardiology

## 2018-09-27 VITALS — BP 122/62 | HR 108 | Ht 68.0 in | Wt 257.0 lb

## 2018-09-27 DIAGNOSIS — I5042 Chronic combined systolic (congestive) and diastolic (congestive) heart failure: Secondary | ICD-10-CM | POA: Diagnosis not present

## 2018-09-27 DIAGNOSIS — I4891 Unspecified atrial fibrillation: Secondary | ICD-10-CM

## 2018-09-27 DIAGNOSIS — I10 Essential (primary) hypertension: Secondary | ICD-10-CM

## 2018-09-27 LAB — BASIC METABOLIC PANEL
BUN/Creatinine Ratio: 16 (ref 10–24)
BUN: 16 mg/dL (ref 8–27)
CO2: 27 mmol/L (ref 20–29)
Calcium: 8.7 mg/dL (ref 8.6–10.2)
Chloride: 99 mmol/L (ref 96–106)
Creatinine, Ser: 1.03 mg/dL (ref 0.76–1.27)
GFR calc Af Amer: 82 mL/min/{1.73_m2} (ref >59)
GFR calc non Af Amer: 71 mL/min/{1.73_m2} (ref >59)
Glucose: 91 mg/dL (ref 65–99)
Potassium: 4.6 mmol/L (ref 3.5–5.2)
Sodium: 142 mmol/L (ref 134–144)

## 2018-09-27 MED ORDER — METOPROLOL TARTRATE 100 MG PO TABS: ORAL_TABLET | ORAL | 3 refills | Status: DC

## 2018-09-27 MED ORDER — FUROSEMIDE 40 MG PO TABS: 40.0000 mg | ORAL_TABLET | Freq: Every day | ORAL | 3 refills | Status: DC

## 2018-09-27 NOTE — Anesthesia Postprocedure Evaluation (Signed)
Anesthesia Post Note  Patient: Ruben Murray  Procedure(s) Performed: CARDIOVERSION (N/A )     Patient location during evaluation: Endoscopy Anesthesia Type: General Level of consciousness: awake and alert Pain management: pain level controlled Vital Signs Assessment: post-procedure vital signs reviewed and stable Respiratory status: spontaneous breathing, nonlabored ventilation, respiratory function stable and patient connected to nasal cannula oxygen Cardiovascular status: blood pressure returned to baseline and stable Postop Assessment: no apparent nausea or vomiting Anesthetic complications: no    Last Vitals:  Vitals:   09/25/18 1140 09/25/18 1144  BP: (!) 95/55 103/78  Pulse: 84 72  Resp: 12 16  Temp:    SpO2: 95% 97%    Last Pain:  Vitals:   09/26/18 1435  TempSrc:   PainSc: 0-No pain   Pain Goal:                   Diahann Guajardo L Jessicamarie Amiri

## 2018-09-27 NOTE — Patient Instructions (Addendum)
Medication Instructions:  INCREASE: Metoprolol to 1.5 tables in the morning and 1.5 tablets in the evening   If you need a refill on your cardiac medications before your next appointment, please call your pharmacy.   Lab work: TODAY: BMET   If you have labs (blood work) drawn today and your tests are completely normal, you will receive your results only by: Marland Kitchen MyChart Message (if you have MyChart) OR . A paper copy in the mail If you have any lab test that is abnormal or we need to change your treatment, we will call you to review the results.  Testing/Procedures: None  Follow-Up: At [facility], you and your health needs are our priority.  As part of our continuing mission to provide you with exceptional heart care, we have created designated Provider Care Teams.  These Care Teams include your primary Cardiologist (physician) and Advanced Practice Providers (APPs -  Physician Assistants and Nurse Practitioners) who all work together to provide you with the care you need, when you need it. You will need a follow up appointment in 6 months.  Please call our office 2 months in advance to schedule this appointment.  You may see [name], MD or one of the following Advanced Practice Providers on your designated Care Team:   Truitt Merle, NP Cecilie Kicks, NP . Kathyrn Drown, NP  Pt needs appt with Afib clinic for antiarrhythmic   Any Other Special Instructions Will Be Listed Below (If Applicable).

## 2018-09-30 ENCOUNTER — Telehealth: Payer: Self-pay | Admitting: Cardiology

## 2018-09-30 NOTE — Telephone Encounter (Signed)
Follow Up  Patient's wife calling back about medication and states that she was told by the pharmacy and [facility] and was told that there were other medications that the patient could be on that are similar to [facility] that are more affordable. Patient's wife is asking for a call back to discuss.

## 2018-09-30 NOTE — Progress Notes (Signed)
Pt has been made aware of normal result and verbalized understanding.  jw 09/30/2018

## 2018-09-30 NOTE — Telephone Encounter (Signed)
New message   Patient calling the office for samples of medication:   1.  What medication and dosage are you requesting samples for? Entresto 49-51 mg     2.  Are you currently out of this medication? No

## 2018-09-30 NOTE — Telephone Encounter (Signed)
I called [facility] and was advised that the pts Entresto cost has gone up to $125/30 day supply from $45/30 day supply but that this is his co-pay as his insurance is paying their part. This must be due to the type of coverage the pt has with his plan as a PA is not required.  The pt was correctly advised to contact their plan for advisement.

## 2018-09-30 NOTE — Telephone Encounter (Signed)
Spoke with patient and his wife and made them aware that we do not have any entresto 49-51 mg samples available. Wife states that the patient is in the donut hole and the refill will cost over $100 this time and they cannot afford that. I made her aware that the two options would be to apply for patient assistance or buy their way out of the donut hole. She says that he only has eight days of medication on hand so she would like this to be resolved as quickly as possible. I made her aware that if they apply for patient assistance that process could take several weeks to get an answer and they would also need to provide proof of income and a print out from the pharmacy showing how much they have spent out of pocket this year on medications. She asked me about buying her way out of the donut hole and I informed her that she would need to contact the insurance company for further details on this process. She will call their insurance to follow up on this process. She thanked me for the return call and was gracious for my assistance.

## 2018-10-01 ENCOUNTER — Telehealth: Payer: Self-pay | Admitting: Cardiology

## 2018-10-01 NOTE — Telephone Encounter (Signed)
Ruben Murray is calling today because patient is needing some samples of  "Entresto". Patient is in the doughnut hole of his insurance, patient needs enough to get him through the time frame of his insurance. Pls call to advise.   Patient calling the office for samples of medication:   1.  What medication and dosage are you requesting samples for? Entresto  2.  Are you currently out of this medication? Will be out on Friday.

## 2018-10-01 NOTE — Telephone Encounter (Signed)
I have advised the pt that we have no samples of Entresto 49-51 mg in the office and do not know when we will get more. He states that he is going to run out soon and cannot afford.  I offered pt asst but will take to long to be approved and the pt will be out of samples by then.  He is requesting to be put on another medication (his pharmacist told him to request the 2 medications that make [facility]) as he cannot afford Entresto.  Will forward message to Dr [name] and his nurse for advisement to the pt.

## 2018-10-01 NOTE — Telephone Encounter (Signed)
**Note De-Identified Ruben Murray Obfuscation** I called the pt and his wife who states that his ins provider says that he is in the donut hole. I discussed Pt asst with them through [employer] pt asst and they are interested in applying. I gave them the phone number to reach out to [employer] to ask questions that they have and to request that they mail them an application.  They are aware to complete the application then bring it to the office so we can take care of the provider part and fax to [employer].  Also, they are aware to call us back if they decide they would rather switch to another medication that is less expensive.

## 2018-10-01 NOTE — Telephone Encounter (Signed)
See phone note from today (10/01/2018) for more details.

## 2018-10-02 MED ORDER — LOSARTAN POTASSIUM 50 MG PO TABS: 50.0000 mg | ORAL_TABLET | Freq: Every day | ORAL | 6 refills | Status: DC

## 2018-10-02 NOTE — Telephone Encounter (Signed)
Unfortunate to hear. Thanks for helping as best as possible with assistance.  Give losartan 61m PO QD instead of Entresto 49/51.  It would of course be ideal [facility] better efficacy in heart failure.   MCandee Furbish MD

## 2018-10-02 NOTE — Telephone Encounter (Signed)
**Note De-Identified Zerrick Hanssen Obfuscation** The pt is advised. He and his wife had a lot of questions concerning Losartan and his insurance. I answered all of their questions that I could and referred them to his Ins plan for others that I could not answer.  They are aware to contact us if the pt has increased weight gain, swelling and/or SOB.  Per their request I have sent his Losartan RX to [facility] in [place] to fill #[phone] with 6 refills.

## 2018-10-03 ENCOUNTER — Ambulatory Visit (HOSPITAL_COMMUNITY)
Admission: RE | Admit: 2018-10-03 | Discharge: 2018-10-03 | Disposition: A | Discharge status: Home / Self Care | Payer: Medicare HMO | Source: Ambulatory Visit | Attending: Physician Assistant | Admitting: Physician Assistant

## 2018-10-03 ENCOUNTER — Other Ambulatory Visit: Payer: Self-pay

## 2018-10-03 ENCOUNTER — Encounter (HOSPITAL_COMMUNITY): Payer: Self-pay | Admitting: Physician Assistant

## 2018-10-03 VITALS — BP 106/56 | HR 122 | Ht 68.0 in | Wt 272.0 lb

## 2018-10-03 DIAGNOSIS — E785 Hyperlipidemia, unspecified: Secondary | ICD-10-CM | POA: Diagnosis not present

## 2018-10-03 DIAGNOSIS — Z7901 Long term (current) use of anticoagulants: Secondary | ICD-10-CM | POA: Diagnosis not present

## 2018-10-03 DIAGNOSIS — G4733 Obstructive sleep apnea (adult) (pediatric): Secondary | ICD-10-CM | POA: Insufficient documentation

## 2018-10-03 DIAGNOSIS — I252 Old myocardial infarction: Secondary | ICD-10-CM | POA: Insufficient documentation

## 2018-10-03 DIAGNOSIS — I110 Hypertensive heart disease with heart failure: Secondary | ICD-10-CM | POA: Insufficient documentation

## 2018-10-03 DIAGNOSIS — I4819 Other persistent atrial fibrillation: Secondary | ICD-10-CM | POA: Diagnosis not present

## 2018-10-03 DIAGNOSIS — Z79899 Other long term (current) drug therapy: Secondary | ICD-10-CM | POA: Insufficient documentation

## 2018-10-03 DIAGNOSIS — Z87891 Personal history of nicotine dependence: Secondary | ICD-10-CM | POA: Diagnosis not present

## 2018-10-03 DIAGNOSIS — I700 Atherosclerosis of aorta: Secondary | ICD-10-CM | POA: Insufficient documentation

## 2018-10-03 DIAGNOSIS — I4510 Unspecified right bundle-branch block: Secondary | ICD-10-CM | POA: Diagnosis not present

## 2018-10-03 DIAGNOSIS — I429 Cardiomyopathy, unspecified: Secondary | ICD-10-CM | POA: Insufficient documentation

## 2018-10-03 DIAGNOSIS — Z8249 Family history of ischemic heart disease and other diseases of the circulatory system: Secondary | ICD-10-CM | POA: Diagnosis not present

## 2018-10-03 DIAGNOSIS — I5042 Chronic combined systolic (congestive) and diastolic (congestive) heart failure: Secondary | ICD-10-CM | POA: Diagnosis not present

## 2018-10-03 LAB — COMPREHENSIVE METABOLIC PANEL
ALT: 29 U/L (ref 0–44)
AST: 22 U/L (ref 15–41)
Albumin: 3.6 g/dL (ref 3.5–5.0)
Alkaline Phosphatase: 102 U/L (ref 38–126)
Anion gap: 8 (ref 5–15)
BUN: 23 mg/dL (ref 8–23)
CO2: 26 mmol/L (ref 22–32)
Calcium: 8.9 mg/dL (ref 8.9–10.3)
Chloride: 106 mmol/L (ref 98–111)
Creatinine, Ser: 1.08 mg/dL (ref 0.61–1.24)
GFR calc Af Amer: >60 mL/min (ref >60)
GFR calc non Af Amer: >60 mL/min (ref >60)
Glucose, Bld: 102 mg/dL — ABNORMAL HIGH (ref 70–99)
Potassium: 4.5 mmol/L (ref 3.5–5.1)
Sodium: 140 mmol/L (ref 135–145)
Total Bilirubin: 0.4 mg/dL (ref 0.3–1.2)
Total Protein: 6.7 g/dL (ref 6.5–8.1)

## 2018-10-03 LAB — TSH: TSH: 2.207 u[IU]/mL (ref 0.350–4.500)

## 2018-10-03 MED ORDER — AMIODARONE HCL 200 MG PO TABS: ORAL_TABLET | ORAL | 0 refills | Status: DC

## 2018-10-03 NOTE — Progress Notes (Signed)
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 male with a h/o obesity, htn, untreated sleep apnea, seizures, that had recently been treated at [facility] for CAP 1/1-1/5. He had f/u with his PCP 2/14 and was found to be in new onset afib. Being seen in [place] clinic 2/15.  He had also been treated with prednisone and finished taper last week. He continues to feel weak recovering from the Pneumonia.   He has OSA but has not used cpap in several months. States that it is broken and needs to see if can get a new machine. No alcohol or tobacco. Moderate caffeine use.  Is sedentary and obese. Has a chadsvasc score of at least 3.  F/u afib clinic 2/19, he was started on warfarin 5 mg on Friday and started diltiazem 120 mg qd. He has returned to [place]. He thinks he feels a little better. He has an appointment to see Dr.Osborne to get his cpap problems addressed. Echo is pending.  Being seen afib clinc 05/08/16 for f/u echo. It did show EF of 40-45% and moderate RAE, RVSP 42 mm, mod TR. Pt has seen Dr. Maxwell Caul for treatment of his sleep apnea, which he was not treating and will be placed on auto cpap with a different mask which he is due to pick up soon to start new treatment. He also reports that he got severely constipated over the weekend and may be due to cardizem. He continues in SR. He states that he has had two mild MI's in the past,around 2005.  Follow up in AF clinic 08/26/18. Patient s/p DCCV 12/2017 and has done well since then until last week when he was seen at [facility] for weakness and SOB. He was found to be in afib HR 104. He continues to have symptoms and is in afib today. No triggers that the patient could identify. His weight is also up today but he denies any edema or swelling.   Follow up in AF clinic 09/05/18. Patient reports that his symptoms of SOB and swelling have mildly improved. He is still in  afib today. He has weekly INRs scheduled. In anticipation of DCCV.   Follow up in the AF clinic 10/03/18. Patient is s/p failed DCCV on 09/25/18. He continues to be fatigued and SOB. His weight is stable, no symptoms of fluid overload. He is in atrial fibrillation today.   Today, he denies symptoms of chest pain,   orthopnea, PND, dizziness, presyncope, syncope, or neurologic sequela.  Positive for weakness, shortness of breath with exertion.The patient is tolerating medications without difficulties and is otherwise without complaint today.  Past Medical History:  Diagnosis Date  . Aortic atherosclerosis ([place])   . CAP (community acquired pneumonia) 03/06/2016  . Cardiomyopathy ([place])    a. EF 40-45% in 04/2016, etiology not defined, managed medically.  . Chronic combined systolic and diastolic CHF (congestive heart failure) ([place])   . Hyperlipidemia   . Hypertension   . Morbid obesity ([place])   . Myocardial infarction [facility]) 1980-early 2000s X 2   "mild ones" (03/06/2016)  . OSA on CPAP    a. pt states was told by Dr. Maxwell Caul that he would not require CPAP as long as he continued to sleep in recliner which he does for his back.  . Paroxysmal atrial fibrillation ([place])   . Pneumonia    "when I was a kid" (03/06/2016)  .  RBBB   . Seizures ([place])    "take RX daily" (03/06/2016)   Past Surgical History:  Procedure Laterality Date  . CARDIOVERSION N/A 12/13/2017   Procedure: CARDIOVERSION;  Surgeon: [name], MD;  Location: [place];  Service: Cardiovascular;  Laterality: N/A;  . CARDIOVERSION N/A 09/25/2018   Procedure: CARDIOVERSION;  Surgeon: [name], MD;  Location[facility] ENDOSCOPY;  Service: Cardiovascular;  Laterality: N/A;  . CIRCUMCISION    . JOINT REPLACEMENT    . PERCUTANEOUS PINNING TOE FRACTURE Left    "big toe  . REPLACEMENT TOTAL KNEE Left   . TONSILLECTOMY AND ADENOIDECTOMY      Current Outpatient Medications  Medication Sig Dispense Refill  . acetaminophen  (TYLENOL) 500 MG tablet Take 500-1,000 mg by mouth daily as needed for moderate pain.     . Carboxymethylcellul-Glycerin (LUBRICATING EYE DROPS OP) Place 1 drop into both eyes daily as needed (dry eyes).    . famotidine (PEPCID) 20 MG tablet One at bedtime 30 tablet 2  . finasteride (PROSCAR) 5 MG tablet Take 5 mg by mouth daily.    . fluticasone (FLONASE) 50 MCG/ACT nasal spray Place 2 sprays into both nostrils 2 (two) times daily.    . furosemide (LASIX) 40 MG tablet Take 1 tablet (40 mg total) by mouth daily. 90 tablet 3  . losartan (COZAAR) 50 MG tablet Take 1 tablet (50 mg total) by mouth daily. To replace Entresto. 30 tablet 6  . Melatonin 10 MG TABS Take 10 mg by mouth at bedtime as needed (sleep).    . metoprolol tartrate (LOPRESSOR) 100 MG tablet Take 1.5 tablet in the AM and 1.5 tablets in the PM 270 tablet 3  . Multiple Vitamin (MULTIVITAMIN WITH MINERALS) TABS tablet Take 1 tablet by mouth daily.    Marland Kitchen PHENobarbital (LUMINAL) 97.2 MG tablet Take 97.2 mg by mouth every morning.    . polyethylene glycol (MIRALAX / GLYCOLAX) packet Take 17 g by mouth daily as needed. 14 each 0  . potassium chloride (K-DUR) 10 MEQ tablet Take 2 tablets (20 mEq total) by mouth daily. 90 tablet 3  . potassium chloride (KLOR-CON) 20 MEQ packet Take 20 mEq by mouth daily.    . pravastatin (PRAVACHOL) 40 MG tablet Take 40 mg by mouth at bedtime.     Marland Kitchen warfarin (COUMADIN) 4 MG tablet Take 4 mg by mouth every other day. Alternate with 5 mg dose    . warfarin (COUMADIN) 5 MG tablet Take 5 mg by mouth every other day. Alternate day with 4 mg dose     No current facility-administered medications for this encounter.     Allergies  Allergen Reactions  . Dilaudid [Hydromorphone Hcl] Other (See Comments)    Makes pt hyper   . Tegretol [Carbamazepine] Hives    Social History   Socioeconomic History  . Marital status: Married    Spouse name: Not on file  . Number of children: Not on file  . Years of  education: Not on file  . Highest education level: Not on file  Occupational History  . Not on file  Social Needs  . Financial resource strain: Somewhat hard  . Food insecurity    Worry: Sometimes true    Inability: Sometimes true  . Transportation needs    Medical: No    Non-medical: No  Tobacco Use  . Smoking status: Former Smoker    Packs/day: 3.00    Years: 48.00    Pack years: 144.00  Types: Cigarettes    Quit date: 2000    Years since quitting: 20.5  . Smokeless tobacco: Never Used  Substance and Sexual Activity  . Alcohol use: No  . Drug use: No  . Sexual activity: Never  Lifestyle  . Physical activity    Days per week: 0 days    Minutes per session: 0 min  . Stress: To some extent  Relationships  . Social Herbalist on phone: Not on file    Gets together: Not on file    Attends religious service: Not on file    Active member of club or organization: Not on file    Attends meetings of clubs or organizations: Not on file    Relationship status: Not on file  . Intimate partner violence    Fear of current or ex partner: Not on file    Emotionally abused: Not on file    Physically abused: Not on file    Forced sexual activity: Not on file  Other Topics Concern  . Not on file  Social History Narrative  . Not on file    Family History  Problem Relation Age of Onset  . Hypertension Mother   . Other Father        sudden cardiac arrest    ROS- All systems are reviewed and negative except as per the HPI above  Physical Exam: There were no vitals filed for this visit. Wt Readings from Last 3 Encounters:  09/27/18 116.6 kg  09/25/18 113.4 kg  09/05/18 125.2 kg    Labs: Lab Results  Component Value Date   NA 142 09/27/2018   K 4.6 09/27/2018   CL 99 09/27/2018   CO2 27 09/27/2018   GLUCOSE 91 09/27/2018   BUN 16 09/27/2018   CREATININE 1.03 09/27/2018   CALCIUM 8.7 09/27/2018   PHOS 3.7 10/30/2017   MG 2.1 12/27/2017   Lab Results   Component Value Date   INR 3.6 (H) 12/10/2017   Lab Results  Component Value Date   CHOL 130 11/02/2017   HDL 31 (L) 11/02/2017   LDLCALC 83 11/02/2017   TRIG 81 11/02/2017    GEN- The patient is well appearing obese male, alert and oriented x 3 today.   HEENT-head normocephalic, atraumatic, sclera clear, conjunctiva pink, hearing intact, trachea midline. Lungs- Clear to ausculation bilaterally, normal work of breathing Heart- irregular rate and rhythm, no murmurs, rubs or gallops  GI- soft, NT, ND, + BS Extremities- no clubbing, cyanosis, or edema MS- no significant deformity or atrophy Skin- no rash or lesion Psych- euthymic mood, full affect Neuro- strength and sensation are intact   EKG- afib HR 122, LAD, RBBB, QRS 140, QTc 490  Epic records reviewed  Echo 03/18/18 - Left ventricle: The cavity size was normal. Wall thickness was   normal. Systolic function was mildly to moderately reduced. The   estimated ejection fraction was in the range of 40% to 45%. Wall   motion was normal; there were no regional wall motion   abnormalities. Doppler parameters are consistent with abnormal   left ventricular relaxation (grade 1 diastolic dysfunction). - Right ventricle: The cavity size was mildly dilated. - Right atrium: The atrium was mildly dilated. - Pulmonary arteries: Systolic pressure was mildly to moderately   increased. PA peak pressure: 40 mm Hg (S).   Assessment and Plan: 1. Persistent atrial fibrillation   S/p failed DCCV on 09/25/18. Patient in persistent atrial fibrillation with symptoms of  weakness and SOB. We discussed therapeutic options including sotalol, Tikosyn, or amiodarone with repeat DCCV. After discussing the risks and benefits, patient would like to start amiodarone. Start amiodarone 200 mg BID, will decrease to 200 mg daily after 4 weeks. Check Cmet/TSH today. Continue warfarin with weekly INR followed by PCP in anticipation of repeat DCCV. Continue  Lopressor 100 mg AM and 150 mg PM. No room in BP to increase rate control.  Recall due to presence of phenobarbital will not be able to take eliquis or xarelto due to drug/drug interaction.  This patients CHA2DS2-VASc Score and unadjusted Ischemic Stroke Rate (% per year) is equal to 4.8 % stroke rate/year from a score of 4  Above score calculated as 1 point each if present [CHF, HTN, DM, Vascular=MI/PAD/Aortic Plaque, Age if 65-74, or Male] Above score calculated as 2 points each if present [Age > 75, or Stroke/TIA/TE]   2. OSA Not currently on CPAP. Per patient, Dr Isidoro Donning told him he did not need it if he slept in recliner.   3. Combined chronic systolic and diastolic CHF No symptoms of fluid overload. Weight stable. On Entresto, BB, Lasix, and K+.   Follow up in AF clinic in 2 weeks.   [facility] [address] [place], [place] [address] [phone]

## 2018-10-03 NOTE — Patient Instructions (Signed)
Start amiodarone 228m twice a day WITH food   Continue weekly INR checks with your primary care --- be sure to call and let them know you are being started on amiodarone twice a day today.

## 2018-10-07 DIAGNOSIS — R569 Unspecified convulsions: Secondary | ICD-10-CM | POA: Diagnosis not present

## 2018-10-07 DIAGNOSIS — Z1389 Encounter for screening for other disorder: Secondary | ICD-10-CM | POA: Diagnosis not present

## 2018-10-07 DIAGNOSIS — R7303 Prediabetes: Secondary | ICD-10-CM | POA: Diagnosis not present

## 2018-10-07 DIAGNOSIS — I10 Essential (primary) hypertension: Secondary | ICD-10-CM | POA: Diagnosis not present

## 2018-10-07 DIAGNOSIS — E7800 Pure hypercholesterolemia, unspecified: Secondary | ICD-10-CM | POA: Diagnosis not present

## 2018-10-07 DIAGNOSIS — D6869 Other thrombophilia: Secondary | ICD-10-CM | POA: Diagnosis not present

## 2018-10-07 DIAGNOSIS — Z13220 Encounter for screening for lipoid disorders: Secondary | ICD-10-CM | POA: Diagnosis not present

## 2018-10-07 DIAGNOSIS — Z0000 Encounter for general adult medical examination without abnormal findings: Secondary | ICD-10-CM | POA: Diagnosis not present

## 2018-10-07 DIAGNOSIS — Z7901 Long term (current) use of anticoagulants: Secondary | ICD-10-CM | POA: Diagnosis not present

## 2018-10-07 DIAGNOSIS — I5023 Acute on chronic systolic (congestive) heart failure: Secondary | ICD-10-CM | POA: Diagnosis not present

## 2018-10-07 DIAGNOSIS — I480 Paroxysmal atrial fibrillation: Secondary | ICD-10-CM | POA: Diagnosis not present

## 2018-10-10 ENCOUNTER — Other Ambulatory Visit: Payer: Self-pay | Admitting: *Deleted

## 2018-10-10 NOTE — Patient Outreach (Addendum)
[facility]) Care Management  10/10/2018  Liev ANDRINGA [birth year] [id number]   RN Health Coach Advance Directive Question  Referral Date:01/18/2018 Referral Source:Transfer from [place] Reason for Referral:Continued Disease Management Education Insurance:[name]    Addendum:  Received telephone call back from Dr. Noland Fordyce nurse, Janett Billow.  Updated Jessica on patient's wishes.  Janett Billow states she will give message to Dr. Alyson Ingles to have discussion with patient.  Outreach Attempt:  Received telephone call from patient and his wife concerning updating their Advance Directives.  HIPAA verified.  Patient and wife reporting that their second designated person on their [place] is wife's sister, and she is now deceased.  They would like to update the [place] and add their niece in place of the sister.  States they have an extra copy of blank Advance Directive packets.  Discussed with patient and wife updating [place] of Attorney paperwork and arranging an appointment with their Notary at the church to have paperwork notarized.  Encouraged them to make several copies and update their documentation at various provider offices.  Patient is also interested about DNR orders.  Discussed the difference from Living Will versus Out of Facility DNR and EMS could only acknowledge Out of Facility DNR or MOST Form.  Patient stating his understanding and states his Living Will displays his wishes of no resuscitation.  Encouraged patient to have discussion with primary care provider, Dr. Alyson Ingles.  Patient stating he attempted to have the conversation at last appointment, but provider was not receptive.  RN Health outreached to primary care office and spoke with [facility].  Left message that patient is interested in Out of Facility DNR and requested they call and speak with patient.  Discussed with patient that once forms are  updated to provide copies with his providers and request documents be scanned in system at next Afib clinic appointment.  Plan:  RN Health Coach will make next telephone outreach to patient within the month of August as previously scheduled.  [place] [email or web address]

## 2018-10-16 ENCOUNTER — Encounter (HOSPITAL_COMMUNITY): Payer: Self-pay | Admitting: Physician Assistant

## 2018-10-16 ENCOUNTER — Other Ambulatory Visit: Payer: Self-pay

## 2018-10-16 ENCOUNTER — Ambulatory Visit (HOSPITAL_COMMUNITY)
Admission: RE | Admit: 2018-10-16 | Discharge: 2018-10-16 | Disposition: A | Discharge status: Home / Self Care | Payer: Medicare HMO | Source: Ambulatory Visit | Attending: Physician Assistant | Admitting: Physician Assistant

## 2018-10-16 VITALS — BP 118/72 | HR 75 | Ht 68.0 in | Wt 272.0 lb

## 2018-10-16 DIAGNOSIS — Z7901 Long term (current) use of anticoagulants: Secondary | ICD-10-CM | POA: Insufficient documentation

## 2018-10-16 DIAGNOSIS — Z96652 Presence of left artificial knee joint: Secondary | ICD-10-CM | POA: Insufficient documentation

## 2018-10-16 DIAGNOSIS — I252 Old myocardial infarction: Secondary | ICD-10-CM | POA: Diagnosis not present

## 2018-10-16 DIAGNOSIS — I5042 Chronic combined systolic (congestive) and diastolic (congestive) heart failure: Secondary | ICD-10-CM | POA: Diagnosis not present

## 2018-10-16 DIAGNOSIS — R569 Unspecified convulsions: Secondary | ICD-10-CM | POA: Insufficient documentation

## 2018-10-16 DIAGNOSIS — E785 Hyperlipidemia, unspecified: Secondary | ICD-10-CM | POA: Insufficient documentation

## 2018-10-16 DIAGNOSIS — Z885 Allergy status to narcotic agent status: Secondary | ICD-10-CM | POA: Insufficient documentation

## 2018-10-16 DIAGNOSIS — Z6841 Body Mass Index (BMI) 40.0 and over, adult: Secondary | ICD-10-CM | POA: Diagnosis not present

## 2018-10-16 DIAGNOSIS — I4819 Other persistent atrial fibrillation: Secondary | ICD-10-CM | POA: Insufficient documentation

## 2018-10-16 DIAGNOSIS — Z7951 Long term (current) use of inhaled steroids: Secondary | ICD-10-CM | POA: Insufficient documentation

## 2018-10-16 DIAGNOSIS — Z8249 Family history of ischemic heart disease and other diseases of the circulatory system: Secondary | ICD-10-CM | POA: Insufficient documentation

## 2018-10-16 DIAGNOSIS — I110 Hypertensive heart disease with heart failure: Secondary | ICD-10-CM | POA: Insufficient documentation

## 2018-10-16 DIAGNOSIS — Z888 Allergy status to other drugs, medicaments and biological substances status: Secondary | ICD-10-CM | POA: Insufficient documentation

## 2018-10-16 DIAGNOSIS — G4733 Obstructive sleep apnea (adult) (pediatric): Secondary | ICD-10-CM | POA: Insufficient documentation

## 2018-10-16 DIAGNOSIS — Z87891 Personal history of nicotine dependence: Secondary | ICD-10-CM | POA: Insufficient documentation

## 2018-10-16 DIAGNOSIS — Z79899 Other long term (current) drug therapy: Secondary | ICD-10-CM | POA: Diagnosis not present

## 2018-10-16 NOTE — Progress Notes (Signed)
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 male with a h/o obesity, htn, untreated sleep apnea, seizures, that had recently been treated at [facility] for CAP 1/1-1/5. He had f/u with his PCP 2/14 and was found to be in new onset afib. Being seen in [place] clinic 2/15.  He had also been treated with prednisone and finished taper last week. He continues to feel weak recovering from the Pneumonia.   He has OSA but has not used cpap in several months. States that it is broken and needs to see if can get a new machine. No alcohol or tobacco. Moderate caffeine use.  Is sedentary and obese. Has a chadsvasc score of at least 3.  F/u afib clinic 2/19, he was started on warfarin 5 mg on Friday and started diltiazem 120 mg qd. He has returned to [place]. He thinks he feels a little better. He has an appointment to see Dr.Osborne to get his cpap problems addressed. Echo is pending.  Being seen afib clinc 05/08/16 for f/u echo. It did show EF of 40-45% and moderate RAE, RVSP 42 mm, mod TR. Pt has seen Dr. Maxwell Caul for treatment of his sleep apnea, which he was not treating and will be placed on auto cpap with a different mask which he is due to pick up soon to start new treatment. He also reports that he got severely constipated over the weekend and may be due to cardizem. He continues in SR. He states that he has had two mild MI's in the past,around 2005.  Follow up in AF clinic 08/26/18. Patient s/p DCCV 12/2017 and has done well since then until last week when he was seen at [facility] for weakness and SOB. He was found to be in afib HR 104. He continues to have symptoms and is in afib today. No triggers that the patient could identify. His weight is also up today but he denies any edema or swelling.   Follow up in AF clinic 09/05/18. Patient reports that his symptoms of SOB and swelling have mildly improved. He is still in  afib today. He has weekly INRs scheduled. In anticipation of DCCV.   Follow up in the AF clinic 10/03/18. Patient is s/p failed DCCV on 09/25/18. He continues to be fatigued and SOB. His weight is stable, no symptoms of fluid overload. He is in atrial fibrillation today.   Follow up in the AF clinic 10/16/18. Patient remains in afib today although his heart rate is much better controlled. His symptoms of fatigue and SOB persist. He is getting weekly INRs at his PCP office.   Today, he denies symptoms of palpitations, chest pain, orthopnea, PND, dizziness, presyncope, syncope, or neurologic sequela.  Positive for weakness, shortness of breath with exertion.The patient is tolerating medications without difficulties and is otherwise without complaint today.  Past Medical History:  Diagnosis Date  . Aortic atherosclerosis ([place])   . CAP (community acquired pneumonia) 03/06/2016  . Cardiomyopathy ([place])    a. EF 40-45% in 04/2016, etiology not defined, managed medically.  . Chronic combined systolic and diastolic CHF (congestive heart failure) ([place])   . Hyperlipidemia   . Hypertension   . Morbid obesity ([place])   . Myocardial infarction [facility]) 1980-early 2000s X 2   "mild ones" (03/06/2016)  . OSA on CPAP    a. pt states was told by Dr. Maxwell Caul that he would not require  CPAP as long as he continued to sleep in recliner which he does for his back.  . Paroxysmal atrial fibrillation (HCC)   . Pneumonia    "when I was a kid" (03/06/2016)  . RBBB   . Seizures (H[facility])    "take RX daily" (03/06/2016)   Past Surgical History:  Procedure Laterality Date  . CARDIOVERSION N/A 12/13/2017   Procedure: CARDIOVERSION;  Surgeon: Conway, Tiffany, MD;  Location: [facility] ENDOSCOPY;  Service: Cardiovascular;  Laterality: N/A;  . CARDIOVERSION N/A 09/25/2018   Procedure: CARDIOVERSION;  Surgeon: Hilty, Kenneth C, MD;  Location: [facility] ENDOSCOPY;  Service: Cardiovascular;  Laterality: N/A;  . CIRCUMCISION    . JOINT REPLACEMENT     . PERCUTANEOUS PINNING TOE FRACTURE Left    "big toe  . REPLACEMENT TOTAL KNEE Left   . TONSILLECTOMY AND ADENOIDECTOMY      Current Outpatient Medications  Medication Sig Dispense Refill  . acetaminophen (TYLENOL) 500 MG tablet Take 500-1,000 mg by mouth daily as needed for moderate pain.     . amiodarone (PACERONE) 200 MG tablet Take 1 tablet by mouth twice a day for 1 month then reduce to 1 tablet a day 60 tablet 0  . Carboxymethylcellul-Glycerin (LUBRICATING EYE DROPS OP) Place 1 drop into both eyes daily as needed (dry eyes).    . famotidine (PEPCID) 20 MG tablet One at bedtime 30 tablet 2  . finasteride (PROSCAR) 5 MG tablet Take 5 mg by mouth daily.    . fluticasone (FLONASE) 50 MCG/ACT nasal spray Place 2 sprays into both nostrils 2 (two) times daily.    . furosemide (LASIX) 40 MG tablet Take 1 tablet (40 mg total) by mouth daily. 90 tablet 3  . losartan (COZAAR) 50 MG tablet Take 1 tablet (50 mg total) by mouth daily. To replace Entresto. 30 tablet 6  . Melatonin 10 MG TABS Take 10 mg by mouth at bedtime as needed (sleep).    . metoprolol tartrate (LOPRESSOR) 100 MG tablet 1 tablet in the morning and 1.5 tablet in the evening    . Multiple Vitamin (MULTIVITAMIN WITH MINERALS) TABS tablet Take 1 tablet by mouth daily.    . PHENobarbital (LUMINAL) 97.2 MG tablet Take 97.2 mg by mouth every morning.    . polyethylene glycol (MIRALAX / GLYCOLAX) packet Take 17 g by mouth daily as needed. 14 each 0  . potassium chloride (K-DUR) 10 MEQ tablet Take 2 tablets (20 mEq total) by mouth daily. 90 tablet 3  . pravastatin (PRAVACHOL) 40 MG tablet Take 40 mg by mouth at bedtime.     . warfarin (COUMADIN) 4 MG tablet Take 4 mg by mouth every other day. Alternate with 5 mg dose    . warfarin (COUMADIN) 5 MG tablet Take 5 mg by mouth every other day. Alternate day with 4 mg dose     No current facility-administered medications for this encounter.     Allergies  Allergen Reactions  . Dilaudid  [Hydromorphone Hcl] Other (See Comments)    Makes pt hyper   . Tegretol [Carbamazepine] Hives    Social History   Socioeconomic History  . Marital status: Married    Spouse name: Not on file  . Number of children: Not on file  . Years of education: Not on file  . Highest education level: Not on file  Occupational History  . Not on file  Social Needs  . Financial resource strain: Somewhat hard  . Food insecurity    Worry: Sometimes true      Inability: Sometimes true  . Transportation needs    Medical: No    Non-medical: No  Tobacco Use  . Smoking status: Former Smoker    Packs/day: 3.00    Years: 48.00    Pack years: 144.00    Types: Cigarettes    Quit date: 2000    Years since quitting: 20.6  . Smokeless tobacco: Never Used  Substance and Sexual Activity  . Alcohol use: No  . Drug use: No  . Sexual activity: Never  Lifestyle  . Physical activity    Days per week: 0 days    Minutes per session: 0 min  . Stress: To some extent  Relationships  . Social Herbalist on phone: Not on file    Gets together: Not on file    Attends religious service: Not on file    Active member of club or organization: Not on file    Attends meetings of clubs or organizations: Not on file    Relationship status: Not on file  . Intimate partner violence    Fear of current or ex partner: Not on file    Emotionally abused: Not on file    Physically abused: Not on file    Forced sexual activity: Not on file  Other Topics Concern  . Not on file  Social History Narrative  . Not on file    Family History  Problem Relation Age of Onset  . Hypertension Mother   . Other Father        sudden cardiac arrest    ROS- All systems are reviewed and negative except as per the HPI above  Physical Exam: Vitals:   10/16/18 1126  BP: 118/72  Pulse: 75  Weight: 123.4 kg  Height: 5' 8" (1.727 m)   Wt Readings from Last 3 Encounters:  10/16/18 123.4 kg  10/03/18 123.4 kg   09/27/18 116.6 kg    Labs: Lab Results  Component Value Date   NA 140 10/03/2018   K 4.5 10/03/2018   CL 106 10/03/2018   CO2 26 10/03/2018   GLUCOSE 102 (H) 10/03/2018   BUN 23 10/03/2018   CREATININE 1.08 10/03/2018   CALCIUM 8.9 10/03/2018   PHOS 3.7 10/30/2017   MG 2.1 12/27/2017   Lab Results  Component Value Date   INR 3.6 (H) 12/10/2017   Lab Results  Component Value Date   CHOL 130 11/02/2017   HDL 31 (L) 11/02/2017   LDLCALC 83 11/02/2017   TRIG 81 11/02/2017   GEN- The patient is well appearing obese male, alert and oriented x 3 today.   HEENT-head normocephalic, atraumatic, sclera clear, conjunctiva pink, hearing intact, trachea midline. Lungs- Clear to ausculation bilaterally, normal work of breathing Heart- irregular rate and rhythm, no murmurs, rubs or gallops  GI- soft, NT, ND, + BS Extremities- no clubbing, cyanosis. Trace edema MS- no significant deformity or atrophy Skin- no rash or lesion Psych- euthymic mood, full affect Neuro- strength and sensation are intact   EKG- afib HR 75, LAD, RBBB, QRS 146, QTc 504  Epic records reviewed  Echo 03/18/18 - Left ventricle: The cavity size was normal. Wall thickness was   normal. Systolic function was mildly to moderately reduced. The   estimated ejection fraction was in the range of 40% to 45%. Wall   motion was normal; there were no regional wall motion   abnormalities. Doppler parameters are consistent with abnormal   left ventricular relaxation (grade 1 diastolic dysfunction). -  Right ventricle: The cavity size was mildly dilated. - Right atrium: The atrium was mildly dilated. - Pulmonary arteries: Systolic pressure was mildly to moderately   increased. PA peak pressure: 40 mm Hg (S).   Assessment and Plan: 1. Persistent atrial fibrillation   S/p failed DCCV on 09/25/18. Patient in persistent atrial fibrillation with symptoms of weakness and SOB although is heart rate has improved on amiodarone.   Continue amiodarone 200 mg BID, will decrease to 200 mg daily after loading for 4 weeks. Continue warfarin with weekly INR followed by PCP in anticipation of repeat DCCV. Will arrange for DCCV after amiodarone loading. Patient to return for Bmet/CBC prior to procedure.  Continue Lopressor 100 mg AM and 150 mg PM.  Recall due to presence of phenobarbital will not be able to take eliquis or xarelto due to drug/drug interaction.  This patients CHA2DS2-VASc Score and unadjusted Ischemic Stroke Rate (% per year) is equal to 4.8 % stroke rate/year from a score of 4  Above score calculated as 1 point each if present [CHF, HTN, DM, Vascular=MI/PAD/Aortic Plaque, Age if 65-74, or Male] Above score calculated as 2 points each if present [Age > 75, or Stroke/TIA/TE]   2. OSA Not currently on CPAP. Per patient, Dr Isidoro Donning told him he did not need it if he slept in recliner.   3. Combined chronic systolic and diastolic CHF No symptoms of fluid overload. Weight stable. On Losartan, BB, Lasix, and K+.   Follow up in AF clinic one week post DCCV.    [facility] [address] [place],  [address] [phone]

## 2018-10-16 NOTE — H&P (View-Only) (Signed)
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 male with a h/o obesity, htn, untreated sleep apnea, seizures, that had recently been treated at [facility] for CAP 1/1-1/5. He had f/u with his PCP 2/14 and was found to be in new onset afib. Being seen in [place] clinic 2/15.  He had also been treated with prednisone and finished taper last week. He continues to feel weak recovering from the Pneumonia.   He has OSA but has not used cpap in several months. States that it is broken and needs to see if can get a new machine. No alcohol or tobacco. Moderate caffeine use.  Is sedentary and obese. Has a chadsvasc score of at least 3.  F/u afib clinic 2/19, he was started on warfarin 5 mg on Friday and started diltiazem 120 mg qd. He has returned to [place]. He thinks he feels a little better. He has an appointment to see Dr.Osborne to get his cpap problems addressed. Echo is pending.  Being seen afib clinc 05/08/16 for f/u echo. It did show EF of 40-45% and moderate RAE, RVSP 42 mm, mod TR. Pt has seen Dr. Maxwell Caul for treatment of his sleep apnea, which he was not treating and will be placed on auto cpap with a different mask which he is due to pick up soon to start new treatment. He also reports that he got severely constipated over the weekend and may be due to cardizem. He continues in SR. He states that he has had two mild MI's in the past,around 2005.  Follow up in AF clinic 08/26/18. Patient s/p DCCV 12/2017 and has done well since then until last week when he was seen at [facility] for weakness and SOB. He was found to be in afib HR 104. He continues to have symptoms and is in afib today. No triggers that the patient could identify. His weight is also up today but he denies any edema or swelling.   Follow up in AF clinic 09/05/18. Patient reports that his symptoms of SOB and swelling have mildly improved. He is still in  afib today. He has weekly INRs scheduled. In anticipation of DCCV.   Follow up in the AF clinic 10/03/18. Patient is s/p failed DCCV on 09/25/18. He continues to be fatigued and SOB. His weight is stable, no symptoms of fluid overload. He is in atrial fibrillation today.   Follow up in the AF clinic 10/16/18. Patient remains in afib today although his heart rate is much better controlled. His symptoms of fatigue and SOB persist. He is getting weekly INRs at his PCP office.   Today, he denies symptoms of palpitations, chest pain, orthopnea, PND, dizziness, presyncope, syncope, or neurologic sequela.  Positive for weakness, shortness of breath with exertion.The patient is tolerating medications without difficulties and is otherwise without complaint today.  Past Medical History:  Diagnosis Date  . Aortic atherosclerosis ([place])   . CAP (community acquired pneumonia) 03/06/2016  . Cardiomyopathy ([place])    a. EF 40-45% in 04/2016, etiology not defined, managed medically.  . Chronic combined systolic and diastolic CHF (congestive heart failure) ([place])   . Hyperlipidemia   . Hypertension   . Morbid obesity ([place])   . Myocardial infarction [facility]) 1980-early 2000s X 2   "mild ones" (03/06/2016)  . OSA on CPAP    a. pt states was told by Dr. Maxwell Caul that he would not require  CPAP as long as he continued to sleep in recliner which he does for his back.  . Paroxysmal atrial fibrillation ([place])   . Pneumonia    "when I was a kid" (03/06/2016)  . RBBB   . Seizures ([place])    "take RX daily" (03/06/2016)   Past Surgical History:  Procedure Laterality Date  . CARDIOVERSION N/A 12/13/2017   Procedure: CARDIOVERSION;  Surgeon: [name], MD;  Location: [place];  Service: Cardiovascular;  Laterality: N/A;  . CARDIOVERSION N/A 09/25/2018   Procedure: CARDIOVERSION;  Surgeon: [name], MD;  Location: [facility] ENDOSCOPY;  Service: Cardiovascular;  Laterality: N/A;  . CIRCUMCISION    . JOINT REPLACEMENT     . PERCUTANEOUS PINNING TOE FRACTURE Left    "big toe  . REPLACEMENT TOTAL KNEE Left   . TONSILLECTOMY AND ADENOIDECTOMY      Current Outpatient Medications  Medication Sig Dispense Refill  . acetaminophen (TYLENOL) 500 MG tablet Take 500-1,000 mg by mouth daily as needed for moderate pain.     Marland Kitchen amiodarone (PACERONE) 200 MG tablet Take 1 tablet by mouth twice a day for 1 month then reduce to 1 tablet a day 60 tablet 0  . Carboxymethylcellul-Glycerin (LUBRICATING EYE DROPS OP) Place 1 drop into both eyes daily as needed (dry eyes).    . famotidine (PEPCID) 20 MG tablet One at bedtime 30 tablet 2  . finasteride (PROSCAR) 5 MG tablet Take 5 mg by mouth daily.    . fluticasone (FLONASE) 50 MCG/ACT nasal spray Place 2 sprays into both nostrils 2 (two) times daily.    . furosemide (LASIX) 40 MG tablet Take 1 tablet (40 mg total) by mouth daily. 90 tablet 3  . losartan (COZAAR) 50 MG tablet Take 1 tablet (50 mg total) by mouth daily. To replace Entresto. 30 tablet 6  . Melatonin 10 MG TABS Take 10 mg by mouth at bedtime as needed (sleep).    . metoprolol tartrate (LOPRESSOR) 100 MG tablet 1 tablet in the morning and 1.5 tablet in the evening    . Multiple Vitamin (MULTIVITAMIN WITH MINERALS) TABS tablet Take 1 tablet by mouth daily.    Marland Kitchen PHENobarbital (LUMINAL) 97.2 MG tablet Take 97.2 mg by mouth every morning.    . polyethylene glycol (MIRALAX / GLYCOLAX) packet Take 17 g by mouth daily as needed. 14 each 0  . potassium chloride (K-DUR) 10 MEQ tablet Take 2 tablets (20 mEq total) by mouth daily. 90 tablet 3  . pravastatin (PRAVACHOL) 40 MG tablet Take 40 mg by mouth at bedtime.     Marland Kitchen warfarin (COUMADIN) 4 MG tablet Take 4 mg by mouth every other day. Alternate with 5 mg dose    . warfarin (COUMADIN) 5 MG tablet Take 5 mg by mouth every other day. Alternate day with 4 mg dose     No current facility-administered medications for this encounter.     Allergies  Allergen Reactions  . Dilaudid  [Hydromorphone Hcl] Other (See Comments)    Makes pt hyper   . Tegretol [Carbamazepine] Hives    Social History   Socioeconomic History  . Marital status: Married    Spouse name: Not on file  . Number of children: Not on file  . Years of education: Not on file  . Highest education level: Not on file  Occupational History  . Not on file  Social Needs  . Financial resource strain: Somewhat hard  . Food insecurity    Worry: Sometimes true  Inability: Sometimes true  . Transportation needs    Medical: No    Non-medical: No  Tobacco Use  . Smoking status: Former Smoker    Packs/day: 3.00    Years: 48.00    Pack years: 144.00    Types: Cigarettes    Quit date: 2000    Years since quitting: 20.6  . Smokeless tobacco: Never Used  Substance and Sexual Activity  . Alcohol use: No  . Drug use: No  . Sexual activity: Never  Lifestyle  . Physical activity    Days per week: 0 days    Minutes per session: 0 min  . Stress: To some extent  Relationships  . Social Herbalist on phone: Not on file    Gets together: Not on file    Attends religious service: Not on file    Active member of club or organization: Not on file    Attends meetings of clubs or organizations: Not on file    Relationship status: Not on file  . Intimate partner violence    Fear of current or ex partner: Not on file    Emotionally abused: Not on file    Physically abused: Not on file    Forced sexual activity: Not on file  Other Topics Concern  . Not on file  Social History Narrative  . Not on file    Family History  Problem Relation Age of Onset  . Hypertension Mother   . Other Father        sudden cardiac arrest    ROS- All systems are reviewed and negative except as per the HPI above  Physical Exam: Vitals:   10/16/18 1126  BP: 118/72  Pulse: 75  Weight: 123.4 kg  Height: 5' 8" (1.727 m)   Wt Readings from Last 3 Encounters:  10/16/18 123.4 kg  10/03/18 123.4 kg   09/27/18 116.6 kg    Labs: Lab Results  Component Value Date   NA 140 10/03/2018   K 4.5 10/03/2018   CL 106 10/03/2018   CO2 26 10/03/2018   GLUCOSE 102 (H) 10/03/2018   BUN 23 10/03/2018   CREATININE 1.08 10/03/2018   CALCIUM 8.9 10/03/2018   PHOS 3.7 10/30/2017   MG 2.1 12/27/2017   Lab Results  Component Value Date   INR 3.6 (H) 12/10/2017   Lab Results  Component Value Date   CHOL 130 11/02/2017   HDL 31 (L) 11/02/2017   LDLCALC 83 11/02/2017   TRIG 81 11/02/2017   GEN- The patient is well appearing obese male, alert and oriented x 3 today.   HEENT-head normocephalic, atraumatic, sclera clear, conjunctiva pink, hearing intact, trachea midline. Lungs- Clear to ausculation bilaterally, normal work of breathing Heart- irregular rate and rhythm, no murmurs, rubs or gallops  GI- soft, NT, ND, + BS Extremities- no clubbing, cyanosis. Trace edema MS- no significant deformity or atrophy Skin- no rash or lesion Psych- euthymic mood, full affect Neuro- strength and sensation are intact   EKG- afib HR 75, LAD, RBBB, QRS 146, QTc 504  Epic records reviewed  Echo 03/18/18 - Left ventricle: The cavity size was normal. Wall thickness was   normal. Systolic function was mildly to moderately reduced. The   estimated ejection fraction was in the range of 40% to 45%. Wall   motion was normal; there were no regional wall motion   abnormalities. Doppler parameters are consistent with abnormal   left ventricular relaxation (grade 1 diastolic dysfunction). -  Right ventricle: The cavity size was mildly dilated. - Right atrium: The atrium was mildly dilated. - Pulmonary arteries: Systolic pressure was mildly to moderately   increased. PA peak pressure: 40 mm Hg (S).   Assessment and Plan: 1. Persistent atrial fibrillation   S/p failed DCCV on 09/25/18. Patient in persistent atrial fibrillation with symptoms of weakness and SOB although is heart rate has improved on amiodarone.   Continue amiodarone 200 mg BID, will decrease to 200 mg daily after loading for 4 weeks. Continue warfarin with weekly INR followed by PCP in anticipation of repeat DCCV. Will arrange for DCCV after amiodarone loading. Patient to return for Bmet/CBC prior to procedure.  Continue Lopressor 100 mg AM and 150 mg PM.  Recall due to presence of phenobarbital will not be able to take eliquis or xarelto due to drug/drug interaction.  This patients CHA2DS2-VASc Score and unadjusted Ischemic Stroke Rate (% per year) is equal to 4.8 % stroke rate/year from a score of 4  Above score calculated as 1 point each if present [CHF, HTN, DM, Vascular=MI/PAD/Aortic Plaque, Age if 65-74, or Male] Above score calculated as 2 points each if present [Age > 75, or Stroke/TIA/TE]   2. OSA Not currently on CPAP. Per patient, Dr Isidoro Donning told him he did not need it if he slept in recliner.   3. Combined chronic systolic and diastolic CHF No symptoms of fluid overload. Weight stable. On Losartan, BB, Lasix, and K+.   Follow up in AF clinic one week post DCCV.    [facility] [address] [place], [place] [address] [phone]

## 2018-10-16 NOTE — Patient Instructions (Signed)
Your cardioversion is scheduled for : 11/04/2018  Arrive at the [facility] and go to admitting at 9:30 Do Not eat or drink anything after midnight the night prior to your procedure. Take all your medications with a sip of water prior to arrival. Do NOT miss any doses of your blood thinner. You will NOT be able to drive home after your procedure.    You will need to be covid tested prior to procedure and this is scheduled at the [facility] drive up testing site.  You will need to self quarantine after this test until your procedure.  You will need to see [place] for labs about a week before covid test.    This has been scheduled for you.

## 2018-10-17 DIAGNOSIS — Z7901 Long term (current) use of anticoagulants: Secondary | ICD-10-CM | POA: Diagnosis not present

## 2018-10-17 DIAGNOSIS — I4891 Unspecified atrial fibrillation: Secondary | ICD-10-CM | POA: Diagnosis not present

## 2018-10-23 ENCOUNTER — Other Ambulatory Visit (HOSPITAL_COMMUNITY): Payer: Self-pay | Admitting: *Deleted

## 2018-10-24 ENCOUNTER — Telehealth (HOSPITAL_COMMUNITY): Payer: Self-pay | Admitting: *Deleted

## 2018-10-24 ENCOUNTER — Encounter: Payer: Self-pay | Admitting: *Deleted

## 2018-10-24 ENCOUNTER — Other Ambulatory Visit: Payer: Self-pay | Admitting: *Deleted

## 2018-10-24 DIAGNOSIS — Z7901 Long term (current) use of anticoagulants: Secondary | ICD-10-CM | POA: Diagnosis not present

## 2018-10-24 DIAGNOSIS — I4891 Unspecified atrial fibrillation: Secondary | ICD-10-CM | POA: Diagnosis not present

## 2018-10-24 NOTE — Patient Outreach (Signed)
[facility]) Care Management  10/24/2018  ELKIN BELFIELD 27-Jul-1945 [id number]   RN Health Coach Monthly Outreach  Referral Date:01/18/2018 Referral Source:Transfer from [place] Reason for Referral:Continued Disease Management Education Insurance:[name]   Outreach Attempt:   Successful telephone outreach to patient for follow up.  HIPAA verified with patient.  Patient continues to report shortness of breath with exertion and weakness related to his atrial fibrillation.  Does state he can feel his heart racing with activity.  Is scheduled for cardioversion no 11/04/2018.  Denies any lower extremity edema at this time.  Just woke up, so has not weighed himself for today.  Patient stating he has spoken with primary care nurse/provider concerning his Out of Facility Do Not Resuscitate wishes and now has the [place] DNR hanging on his refrigerator.   Appointments:  Attended appointment with Dr. Alyson Ingles, primary care provider on 10/07/2018.  Attended appointment with Afib Clinic on 10/16/2018 and scheduled for repeat outpatient cardioversion on 11/04/2018.  Plan:  RN Health Coach will make next telephone outreach to patient within the month of September.   [place] [phone] [email or web address]

## 2018-10-24 NOTE — Telephone Encounter (Signed)
INR 8/13 3.1 INR 8/20 2.4

## 2018-10-28 ENCOUNTER — Other Ambulatory Visit: Payer: Self-pay

## 2018-10-28 ENCOUNTER — Ambulatory Visit (HOSPITAL_COMMUNITY)
Admission: RE | Admit: 2018-10-28 | Discharge: 2018-10-28 | Disposition: A | Discharge status: Home / Self Care | Payer: Medicare HMO | Source: Ambulatory Visit | Attending: Physician Assistant | Admitting: Physician Assistant

## 2018-10-28 DIAGNOSIS — I4819 Other persistent atrial fibrillation: Secondary | ICD-10-CM | POA: Diagnosis not present

## 2018-10-28 LAB — BASIC METABOLIC PANEL
Anion gap: 9 (ref 5–15)
BUN: 19 mg/dL (ref 8–23)
CO2: 29 mmol/L (ref 22–32)
Calcium: 8.6 mg/dL — ABNORMAL LOW (ref 8.9–10.3)
Chloride: 104 mmol/L (ref 98–111)
Creatinine, Ser: 0.99 mg/dL (ref 0.61–1.24)
GFR calc Af Amer: >60 mL/min (ref >60)
GFR calc non Af Amer: >60 mL/min (ref >60)
Glucose, Bld: 89 mg/dL (ref 70–99)
Potassium: 4.4 mmol/L (ref 3.5–5.1)
Sodium: 142 mmol/L (ref 135–145)

## 2018-10-28 LAB — CBC
HCT: 48.8 % (ref 39.0–52.0)
Hemoglobin: 15.6 g/dL (ref 13.0–17.0)
MCH: 31.1 pg (ref 26.0–34.0)
MCHC: 32 g/dL (ref 30.0–36.0)
MCV: 97.2 fL (ref 80.0–100.0)
Platelets: 195 10*3/uL (ref 150–400)
RBC: 5.02 MIL/uL (ref 4.22–5.81)
RDW: 13.1 % (ref 11.5–15.5)
WBC: 8.1 10*3/uL (ref 4.0–10.5)
nRBC: 0 % (ref 0.0–0.2)

## 2018-10-30 ENCOUNTER — Other Ambulatory Visit (HOSPITAL_COMMUNITY): Payer: Self-pay | Admitting: Physician Assistant

## 2018-10-31 ENCOUNTER — Other Ambulatory Visit (HOSPITAL_COMMUNITY)
Admission: RE | Admit: 2018-10-31 | Discharge: 2018-10-31 | Disposition: A | Discharge status: Home / Self Care | Payer: Medicare HMO | Source: Ambulatory Visit | Attending: Cardiovascular Disease | Admitting: Cardiovascular Disease

## 2018-10-31 DIAGNOSIS — Z20828 Contact with and (suspected) exposure to other viral communicable diseases: Secondary | ICD-10-CM | POA: Diagnosis not present

## 2018-10-31 DIAGNOSIS — Z01812 Encounter for preprocedural laboratory examination: Secondary | ICD-10-CM | POA: Diagnosis not present

## 2018-10-31 DIAGNOSIS — Z7901 Long term (current) use of anticoagulants: Secondary | ICD-10-CM | POA: Diagnosis not present

## 2018-10-31 DIAGNOSIS — I480 Paroxysmal atrial fibrillation: Secondary | ICD-10-CM | POA: Diagnosis not present

## 2018-10-31 LAB — SARS CORONAVIRUS 2 (TAT 6-24 HRS): SARS Coronavirus 2: NEGATIVE

## 2018-11-01 ENCOUNTER — Telehealth (HOSPITAL_COMMUNITY): Payer: Self-pay | Admitting: *Deleted

## 2018-11-01 NOTE — Telephone Encounter (Signed)
INR 2.9 on 8/27. Pt to have INR prior to admission for DCCV 8/31.

## 2018-11-04 ENCOUNTER — Encounter (HOSPITAL_COMMUNITY)
Admission: RE | Disposition: A | Discharge status: Home / Self Care | Payer: Self-pay | Source: Home / Self Care | Attending: Cardiovascular Disease

## 2018-11-04 ENCOUNTER — Encounter (HOSPITAL_COMMUNITY): Payer: Self-pay | Admitting: *Deleted

## 2018-11-04 ENCOUNTER — Telehealth (HOSPITAL_COMMUNITY): Payer: Self-pay | Admitting: *Deleted

## 2018-11-04 ENCOUNTER — Ambulatory Visit (HOSPITAL_COMMUNITY): Payer: Medicare HMO | Admitting: Certified Registered"

## 2018-11-04 ENCOUNTER — Ambulatory Visit (HOSPITAL_COMMUNITY)
Admission: RE | Admit: 2018-11-04 | Discharge: 2018-11-04 | Disposition: A | Discharge status: Home / Self Care | Payer: Medicare HMO | Attending: Cardiovascular Disease | Admitting: Cardiovascular Disease

## 2018-11-04 DIAGNOSIS — I252 Old myocardial infarction: Secondary | ICD-10-CM | POA: Insufficient documentation

## 2018-11-04 DIAGNOSIS — I4819 Other persistent atrial fibrillation: Secondary | ICD-10-CM | POA: Insufficient documentation

## 2018-11-04 DIAGNOSIS — Z87891 Personal history of nicotine dependence: Secondary | ICD-10-CM | POA: Diagnosis not present

## 2018-11-04 DIAGNOSIS — E785 Hyperlipidemia, unspecified: Secondary | ICD-10-CM | POA: Diagnosis not present

## 2018-11-04 DIAGNOSIS — Z6841 Body Mass Index (BMI) 40.0 and over, adult: Secondary | ICD-10-CM | POA: Insufficient documentation

## 2018-11-04 DIAGNOSIS — G4733 Obstructive sleep apnea (adult) (pediatric): Secondary | ICD-10-CM | POA: Insufficient documentation

## 2018-11-04 DIAGNOSIS — Z7901 Long term (current) use of anticoagulants: Secondary | ICD-10-CM | POA: Diagnosis not present

## 2018-11-04 DIAGNOSIS — I5042 Chronic combined systolic (congestive) and diastolic (congestive) heart failure: Secondary | ICD-10-CM | POA: Diagnosis not present

## 2018-11-04 DIAGNOSIS — J449 Chronic obstructive pulmonary disease, unspecified: Secondary | ICD-10-CM | POA: Diagnosis not present

## 2018-11-04 DIAGNOSIS — I429 Cardiomyopathy, unspecified: Secondary | ICD-10-CM | POA: Diagnosis not present

## 2018-11-04 DIAGNOSIS — I4891 Unspecified atrial fibrillation: Secondary | ICD-10-CM | POA: Diagnosis not present

## 2018-11-04 DIAGNOSIS — Z885 Allergy status to narcotic agent status: Secondary | ICD-10-CM | POA: Diagnosis not present

## 2018-11-04 DIAGNOSIS — Z79899 Other long term (current) drug therapy: Secondary | ICD-10-CM | POA: Diagnosis not present

## 2018-11-04 DIAGNOSIS — I110 Hypertensive heart disease with heart failure: Secondary | ICD-10-CM | POA: Insufficient documentation

## 2018-11-04 DIAGNOSIS — I5043 Acute on chronic combined systolic (congestive) and diastolic (congestive) heart failure: Secondary | ICD-10-CM | POA: Diagnosis not present

## 2018-11-04 DIAGNOSIS — I480 Paroxysmal atrial fibrillation: Secondary | ICD-10-CM | POA: Diagnosis not present

## 2018-11-04 DIAGNOSIS — E119 Type 2 diabetes mellitus without complications: Secondary | ICD-10-CM | POA: Diagnosis not present

## 2018-11-04 HISTORY — PX: CARDIOVERSION: SHX1299

## 2018-11-04 SURGERY — CARDIOVERSION
Anesthesia: General

## 2018-11-04 MED ORDER — LIDOCAINE 2% (20 MG/ML) 5 ML SYRINGE
INTRAMUSCULAR | Status: DC | PRN
  Administered 2018-11-04: 60 mg via INTRAVENOUS

## 2018-11-04 MED ORDER — SODIUM CHLORIDE 0.9 % IV SOLN
INTRAVENOUS | Status: DC
  Administered 2018-11-04: 10:00:00 via INTRAVENOUS

## 2018-11-04 MED ORDER — PROPOFOL 10 MG/ML IV BOLUS
INTRAVENOUS | Status: DC | PRN
  Administered 2018-11-04: 50 mg via INTRAVENOUS

## 2018-11-04 MED ORDER — METOPROLOL TARTRATE 100 MG PO TABS: 50.0000 mg | ORAL_TABLET | ORAL | 3 refills | Status: DC

## 2018-11-04 NOTE — Interval H&P Note (Signed)
History and Physical Interval Note:  11/04/2018 9:46 AM  Ruben Murray  has presented today for surgery, with the diagnosis of atrial fibrillation.  The various methods of treatment have been discussed with the patient and family. After consideration of risks, benefits and other options for treatment, the patient has consented to  Procedure(s): CARDIOVERSION (N/A) as a surgical intervention.  The patient's history has been reviewed, patient examined, no change in status, stable for surgery.  I have reviewed the patient's chart and labs.  Questions were answered to the patient's satisfaction.     Mertie Moores

## 2018-11-04 NOTE — Anesthesia Procedure Notes (Signed)
Procedure Name: General with mask airway Date/Time: 11/04/2018 9:57 AM Performed by: Imagene Riches, CRNA Pre-anesthesia Checklist: Patient identified, Emergency Drugs available, Suction available and Patient being monitored Patient Re-evaluated:Patient Re-evaluated prior to induction Oxygen Delivery Method: Ambu bag

## 2018-11-04 NOTE — Transfer of Care (Signed)
Immediate Anesthesia Transfer of Care Note  Patient: Ruben Murray  Procedure(s) Performed: CARDIOVERSION (N/A )  Patient Location: Endoscopy Unit  Anesthesia Type:General  Level of Consciousness: drowsy  Airway & Oxygen Therapy: Patient Spontanous Breathing  Post-op Assessment: Report given to RN and Post -op Vital signs reviewed and stable  Post vital signs: Reviewed and stable  Last Vitals:  Vitals Value Taken Time  BP    Temp    Pulse    Resp    SpO2      Last Pain:  Vitals:   11/04/18 0920  TempSrc: Temporal  PainSc: 0-No pain         Complications: No apparent anesthesia complications

## 2018-11-04 NOTE — Anesthesia Preprocedure Evaluation (Signed)
Anesthesia Evaluation  Patient identified by MRN, date of birth, ID band Patient awake    Reviewed: Allergy & Precautions, NPO status , Patient's Chart, lab work & pertinent test results, reviewed documented beta blocker date and time   Airway Mallampati: III  TM Distance: >3 FB Neck ROM: Full    Dental no notable dental hx. (+) Edentulous Upper, Upper Dentures, Partial Lower   Pulmonary asthma , sleep apnea , COPD, former smoker,    Pulmonary exam normal        Cardiovascular hypertension, Pt. on medications and Pt. on home beta blockers + Past MI and +CHF  + dysrhythmias Atrial Fibrillation  Rhythm:Irregular Rate:Normal  EKG RBBB  TTE 03/2018 EF 40-45%, G1DD, no significant valvular abnormalities   Neuro/Psych Seizures -,  negative psych ROS   GI/Hepatic negative GI ROS, Neg liver ROS,   Endo/Other  Morbid obesity  Renal/GU negative Renal ROS  negative genitourinary   Musculoskeletal negative musculoskeletal ROS (+)   Abdominal (+) + obese,   Peds  Hematology  (+) Blood dyscrasia (on coumadin), ,   Anesthesia Other Findings   Reproductive/Obstetrics                             Anesthesia Physical  Anesthesia Plan  ASA: III  Anesthesia Plan: General   Post-op Pain Management:    Induction: Intravenous  PONV Risk Score and Plan: 2 and Propofol infusion, Treatment may vary due to age or medical condition and TIVA  Airway Management Planned: Mask  Additional Equipment: None  Intra-op Plan:   Post-operative Plan:   Informed Consent: I have reviewed the patients History and Physical, chart, labs and discussed the procedure including the risks, benefits and alternatives for the proposed anesthesia with the patient or authorized representative who has indicated his/her understanding and acceptance.       Plan Discussed with: CRNA  Anesthesia Plan Comments:          Anesthesia Quick Evaluation

## 2018-11-04 NOTE — Telephone Encounter (Signed)
INR 3.5 on 11/04/18

## 2018-11-04 NOTE — Anesthesia Postprocedure Evaluation (Signed)
Anesthesia Post Note  Patient: [name]  Procedure(s) Performed: CARDIOVERSION (N/A )     Patient location during evaluation: Endoscopy Anesthesia Type: General Level of consciousness: awake and alert Pain management: pain level controlled Vital Signs Assessment: post-procedure vital signs reviewed and stable Respiratory status: spontaneous breathing, nonlabored ventilation and respiratory function stable Cardiovascular status: blood pressure returned to baseline and stable Postop Assessment: no apparent nausea or vomiting Anesthetic complications: no    Last Vitals:  Vitals:   11/04/18 1001 11/04/18 1016  BP: 129/84 119/71  Pulse: (!) 52 (!) 41  Resp: 14 14  Temp: (!) 36.3 C   SpO2: 91% 95%    Last Pain:  Vitals:   11/04/18 1016  TempSrc:   PainSc: 0-No pain                 Lidia Collum

## 2018-11-04 NOTE — CV Procedure (Addendum)
Cardioversion Note  Ruben Murray [id number] May 05, 1945  Procedure: DC Cardioversion Indications: Atrial fib   Procedure Details Consent: Obtained Time Out: Verified patient identification, verified procedure, site/side was marked, verified correct patient position, special equipment/implants available, Radiology Safety Procedures followed,  medications/allergies/relevent history reviewed, required imaging and test results available.  Performed  The patient has been on adequate anticoagulation.  The patient received IV  Lidocaine 60 mg followed by Propofol 50 mg IV  for sedation.  Synchronous cardioversion was performed at 200  joules.  The cardioversion was successful . With the addition of the amiodarone, he is bradycardic after cardioversion He is currently taking metoprolol 100 mg in AM, 150 mg on PM Will reduce Metoprolol to 50 mg PO BID .     Complications: No apparent complications Patient did tolerate procedure well.   Ruben Murray, Ruben Murray., MD, [facility] 11/04/2018, 10:03 AM

## 2018-11-05 ENCOUNTER — Encounter (HOSPITAL_COMMUNITY): Payer: Self-pay | Admitting: Cardiovascular Disease

## 2018-11-12 ENCOUNTER — Encounter (HOSPITAL_COMMUNITY): Payer: Self-pay | Admitting: Physician Assistant

## 2018-11-12 ENCOUNTER — Other Ambulatory Visit: Payer: Self-pay

## 2018-11-12 ENCOUNTER — Ambulatory Visit (HOSPITAL_COMMUNITY)
Admission: RE | Admit: 2018-11-12 | Discharge: 2018-11-12 | Disposition: A | Discharge status: Home / Self Care | Payer: Medicare HMO | Source: Ambulatory Visit | Attending: Physician Assistant | Admitting: Physician Assistant

## 2018-11-12 VITALS — BP 132/76 | HR 54 | Ht 62.0 in | Wt 281.6 lb

## 2018-11-12 DIAGNOSIS — Z6841 Body Mass Index (BMI) 40.0 and over, adult: Secondary | ICD-10-CM | POA: Insufficient documentation

## 2018-11-12 DIAGNOSIS — I110 Hypertensive heart disease with heart failure: Secondary | ICD-10-CM | POA: Diagnosis not present

## 2018-11-12 DIAGNOSIS — Z8249 Family history of ischemic heart disease and other diseases of the circulatory system: Secondary | ICD-10-CM | POA: Diagnosis not present

## 2018-11-12 DIAGNOSIS — G4733 Obstructive sleep apnea (adult) (pediatric): Secondary | ICD-10-CM | POA: Insufficient documentation

## 2018-11-12 DIAGNOSIS — Z888 Allergy status to other drugs, medicaments and biological substances status: Secondary | ICD-10-CM | POA: Diagnosis not present

## 2018-11-12 DIAGNOSIS — E785 Hyperlipidemia, unspecified: Secondary | ICD-10-CM | POA: Diagnosis not present

## 2018-11-12 DIAGNOSIS — Z96652 Presence of left artificial knee joint: Secondary | ICD-10-CM | POA: Insufficient documentation

## 2018-11-12 DIAGNOSIS — I4819 Other persistent atrial fibrillation: Secondary | ICD-10-CM

## 2018-11-12 DIAGNOSIS — I480 Paroxysmal atrial fibrillation: Secondary | ICD-10-CM | POA: Diagnosis not present

## 2018-11-12 DIAGNOSIS — Z87891 Personal history of nicotine dependence: Secondary | ICD-10-CM | POA: Diagnosis not present

## 2018-11-12 DIAGNOSIS — I5042 Chronic combined systolic (congestive) and diastolic (congestive) heart failure: Secondary | ICD-10-CM | POA: Insufficient documentation

## 2018-11-12 DIAGNOSIS — I252 Old myocardial infarction: Secondary | ICD-10-CM | POA: Insufficient documentation

## 2018-11-12 DIAGNOSIS — I4510 Unspecified right bundle-branch block: Secondary | ICD-10-CM | POA: Insufficient documentation

## 2018-11-12 DIAGNOSIS — R001 Bradycardia, unspecified: Secondary | ICD-10-CM | POA: Insufficient documentation

## 2018-11-12 DIAGNOSIS — R9431 Abnormal electrocardiogram [ECG] [EKG]: Secondary | ICD-10-CM | POA: Insufficient documentation

## 2018-11-12 DIAGNOSIS — R569 Unspecified convulsions: Secondary | ICD-10-CM | POA: Insufficient documentation

## 2018-11-12 DIAGNOSIS — I429 Cardiomyopathy, unspecified: Secondary | ICD-10-CM | POA: Insufficient documentation

## 2018-11-12 DIAGNOSIS — Z79899 Other long term (current) drug therapy: Secondary | ICD-10-CM | POA: Diagnosis not present

## 2018-11-12 DIAGNOSIS — Z885 Allergy status to narcotic agent status: Secondary | ICD-10-CM | POA: Diagnosis not present

## 2018-11-12 DIAGNOSIS — I700 Atherosclerosis of aorta: Secondary | ICD-10-CM | POA: Diagnosis not present

## 2018-11-12 DIAGNOSIS — Z7901 Long term (current) use of anticoagulants: Secondary | ICD-10-CM | POA: Diagnosis not present

## 2018-11-12 NOTE — Progress Notes (Signed)
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 male with a h/o obesity, htn, untreated sleep apnea, seizures, that had recently been treated at [facility] for CAP 1/1-1/5. He had f/u with his PCP 2/14 and was found to be in new onset afib. Being seen in [place] clinic 2/15.  He had also been treated with prednisone and finished taper last week. He continues to feel weak recovering from the Pneumonia.   He has OSA but has not used cpap in several months. States that it is broken and needs to see if can get a new machine. No alcohol or tobacco. Moderate caffeine use.  Is sedentary and obese. Has a chadsvasc score of at least 3.  F/u afib clinic 2/19, he was started on warfarin 5 mg on Friday and started diltiazem 120 mg qd. He has returned to [place]. He thinks he feels a little better. He has an appointment to see Dr.Osborne to get his cpap problems addressed. Echo is pending.  Being seen afib clinc 05/08/16 for f/u echo. It did show EF of 40-45% and moderate RAE, RVSP 42 mm, mod TR. Pt has seen Dr. Maxwell Caul for treatment of his sleep apnea, which he was not treating and will be placed on auto cpap with a different mask which he is due to pick up soon to start new treatment. He also reports that he got severely constipated over the weekend and may be due to cardizem. He continues in SR. He states that he has had two mild MI's in the past,around 2005.  Follow up AF clinic 11/12/18. Patient s/p DCCV 12/2017 with return of AF. He continued to have symptoms of afib. No triggers that the patient could identify. Pt failed DCCV on 09/25/18. Now s/p repeat DCCV on 11/04/18 after loading on amiodarone. Patient reports that his energy level and dyspnea have greatly improved. He walked to the the exam room today (previsously required wheelchair). His weight is up today but patient denies symptoms of fluid overload.   Today, he  denies symptoms of palpitations, chest pain, SOB, orthopnea, PND, dizziness, presyncope, syncope, or neurologic sequela. The patient is tolerating medications without difficulties and is otherwise without complaint today.  Past Medical History:  Diagnosis Date  . Aortic atherosclerosis ([place])   . CAP (community acquired pneumonia) 03/06/2016  . Cardiomyopathy ([place])    a. EF 40-45% in 04/2016, etiology not defined, managed medically.  . Chronic combined systolic and diastolic CHF (congestive heart failure) ([place])   . Hyperlipidemia   . Hypertension   . Morbid obesity ([place])   . Myocardial infarction [facility]) 1980-early 2000s X 2   "mild ones" (03/06/2016)  . OSA on CPAP    a. pt states was told by Dr. Maxwell Caul that he would not require CPAP as long as he continued to sleep in recliner which he does for his back.  . Paroxysmal atrial fibrillation ([place])   . Pneumonia    "when I was a kid" (03/06/2016)  . RBBB   . Seizures ([place])    "take RX daily" (03/06/2016)   Past Surgical History:  Procedure Laterality Date  . CARDIOVERSION N/A 12/13/2017   Procedure: CARDIOVERSION;  Surgeon: [name], MD;  Location: [facility] ENDOSCOPY;  Service: Cardiovascular;  Laterality: N/A;  . CARDIOVERSION N/A 09/25/2018   Procedure: CARDIOVERSION;  Surgeon: [name], MD;  Location: [place];  Service: Cardiovascular;  Laterality: N/A;  .  CARDIOVERSION N/A 11/04/2018   Procedure: CARDIOVERSION;  Surgeon: Acie Fredrickson Wonda Cheng, MD;  Location: [place];  Service: Cardiovascular;  Laterality: N/A;  . CIRCUMCISION    . JOINT REPLACEMENT    . PERCUTANEOUS PINNING TOE FRACTURE Left    "big toe  . REPLACEMENT TOTAL KNEE Left   . TONSILLECTOMY AND ADENOIDECTOMY      Current Outpatient Medications  Medication Sig Dispense Refill  . acetaminophen (TYLENOL) 500 MG tablet Take 500-1,000 mg by mouth daily as needed for moderate pain.     Marland Kitchen amiodarone (PACERONE) 200 MG tablet Take 1 tablet (200 mg total) by mouth  daily. 30 tablet 3  . Carboxymethylcellul-Glycerin (LUBRICATING EYE DROPS OP) Place 1 drop into both eyes daily as needed (dry eyes).    . famotidine (PEPCID) 20 MG tablet One at bedtime (Patient taking differently: Take 20 mg by mouth at bedtime. One at bedtime) 30 tablet 2  . finasteride (PROSCAR) 5 MG tablet Take 5 mg by mouth daily.    . fluticasone (FLONASE) 50 MCG/ACT nasal spray Place 2 sprays into both nostrils 2 (two) times daily.    . furosemide (LASIX) 40 MG tablet Take 1 tablet (40 mg total) by mouth daily. 90 tablet 3  . losartan (COZAAR) 50 MG tablet Take 1 tablet (50 mg total) by mouth daily. To replace Entresto. 30 tablet 6  . Melatonin 10 MG TABS Take 10 mg by mouth at bedtime as needed (sleep).    . metoprolol tartrate (LOPRESSOR) 100 MG tablet Take 0.5 tablets (50 mg total) by mouth See admin instructions. 1 tablet in the morning and 1.5 tablet in the evening 45 tablet 3  . Multiple Vitamin (MULTIVITAMIN WITH MINERALS) TABS tablet Take 1 tablet by mouth daily.    Marland Kitchen PHENobarbital (LUMINAL) 97.2 MG tablet Take 97.2 mg by mouth every morning.    . polyethylene glycol (MIRALAX / GLYCOLAX) packet Take 17 g by mouth daily as needed. (Patient taking differently: Take 17 g by mouth daily as needed for moderate constipation. ) 14 each 0  . potassium chloride (K-DUR) 10 MEQ tablet Take 2 tablets (20 mEq total) by mouth daily. 90 tablet 3  . pravastatin (PRAVACHOL) 40 MG tablet Take 40 mg by mouth at bedtime.     Marland Kitchen warfarin (COUMADIN) 3 MG tablet Take 3 mg by mouth See admin instructions. Alternate day with 3 mg dose    . warfarin (COUMADIN) 4 MG tablet Take 4 mg by mouth See admin instructions. Alternate with  3 mg dose every other day     No current facility-administered medications for this encounter.     Allergies  Allergen Reactions  . Dilaudid [Hydromorphone Hcl] Other (See Comments)    Makes pt hyper   . Tegretol [Carbamazepine] Hives    Social History   Socioeconomic  History  . Marital status: Married    Spouse name: Not on file  . Number of children: Not on file  . Years of education: Not on file  . Highest education level: Not on file  Occupational History  . Not on file  Social Needs  . Financial resource strain: Somewhat hard  . Food insecurity    Worry: Sometimes true    Inability: Sometimes true  . Transportation needs    Medical: No    Non-medical: No  Tobacco Use  . Smoking status: Former Smoker    Packs/day: 3.00    Years: 48.00    Pack years: 144.00    Types: Cigarettes  Quit date: 2000    Years since quitting: 20.7  . Smokeless tobacco: Never Used  Substance and Sexual Activity  . Alcohol use: No  . Drug use: No  . Sexual activity: Never  Lifestyle  . Physical activity    Days per week: 0 days    Minutes per session: 0 min  . Stress: To some extent  Relationships  . Social Herbalist on phone: Not on file    Gets together: Not on file    Attends religious service: Not on file    Active member of club or organization: Not on file    Attends meetings of clubs or organizations: Not on file    Relationship status: Not on file  . Intimate partner violence    Fear of current or ex partner: Not on file    Emotionally abused: Not on file    Physically abused: Not on file    Forced sexual activity: Not on file  Other Topics Concern  . Not on file  Social History Narrative  . Not on file    Family History  Problem Relation Age of Onset  . Hypertension Mother   . Other Father        sudden cardiac arrest    ROS- All systems are reviewed and negative except as per the HPI above  Physical Exam: Vitals:   11/12/18 1136  BP: 132/76  Pulse: (!) 54  Weight: 127.7 kg  Height: _0  (1.575 m)   Wt Readings from Last 3 Encounters:  11/12/18 127.7 kg  11/04/18 123.4 kg  10/16/18 123.4 kg    Labs: Lab Results  Component Value Date   NA 142 10/28/2018   K 4.4 10/28/2018   CL 104 10/28/2018   CO2 29  10/28/2018   GLUCOSE 89 10/28/2018   BUN 19 10/28/2018   CREATININE 0.99 10/28/2018   CALCIUM 8.6 (L) 10/28/2018   PHOS 3.7 10/30/2017   MG 2.1 12/27/2017   Lab Results  Component Value Date   INR 3.6 (H) 12/10/2017   Lab Results  Component Value Date   CHOL 130 11/02/2017   HDL 31 (L) 11/02/2017   LDLCALC 83 11/02/2017   TRIG 81 11/02/2017   GEN- The patient is well appearing obese elderly male, alert and oriented x 3 today.   HEENT-head normocephalic, atraumatic, sclera clear, conjunctiva pink, hearing intact, trachea midline. Lungs- Clear to ausculation bilaterally, normal work of breathing Heart- Regular rate and rhythm, no murmurs, rubs or gallops  GI- soft, NT, ND, + BS Extremities- no clubbing, cyanosis. Trace edema bilaterally.  MS- no significant deformity or atrophy Skin- no rash or lesion Psych- euthymic mood, full affect Neuro- strength and sensation are intact   EKG- SB HR 54, RBBB, LAD, PR 196, QRS 152, QTc 445  Epic records reviewed  Echo 03/18/18 - Left ventricle: The cavity size was normal. Wall thickness was   normal. Systolic function was mildly to moderately reduced. The   estimated ejection fraction was in the range of 40% to 45%. Wall   motion was normal; there were no regional wall motion   abnormalities. Doppler parameters are consistent with abnormal   left ventricular relaxation (grade 1 diastolic dysfunction). - Right ventricle: The cavity size was mildly dilated. - Right atrium: The atrium was mildly dilated. - Pulmonary arteries: Systolic pressure was mildly to moderately   increased. PA peak pressure: 40 mm Hg (S).   Assessment and Plan: 1. Persistent atrial  fibrillation   S/p failed DCCV on 09/25/18. Repeat DCCV on 11/04/18. Patient appears to be maintaining SR with symptomatic improvement.  Continue amiodarone 200 mg daily. Will plan to repeat TSH and LFT on f/u. Continue warfarin  Continue Lopressor 50 mg BID Recall due to presence  of phenobarbital will not be able to take eliquis or xarelto due to drug/drug interaction.  This patients CHA2DS2-VASc Score and unadjusted Ischemic Stroke Rate (% per year) is equal to 4.8 % stroke rate/year from a score of 4  Above score calculated as 1 point each if present [CHF, HTN, DM, Vascular=MI/PAD/Aortic Plaque, Age if 65-74, or Male] Above score calculated as 2 points each if present [Age > 75, or Stroke/TIA/TE]   2. OSA Not currently on CPAP. Per patient, Dr Isidoro Donning told him he did not need it if he slept in recliner.   3. Combined chronic systolic and diastolic CHF Weight up today but patient states he is still within his normal range. He denies any symptoms of fluid overload. Cautioned him to continue to weigh daily and avoid sodium. If his weight should continue to rise, instructed patient to call his primary cardiologist. Patient voices understanding. No change in medications today.   Follow up in the AF clinic in 2 months.   [facility] [address] [place], [place] [address] [phone]

## 2018-11-15 ENCOUNTER — Encounter: Payer: Self-pay | Admitting: *Deleted

## 2018-11-15 ENCOUNTER — Other Ambulatory Visit: Payer: Self-pay | Admitting: *Deleted

## 2018-11-15 NOTE — Patient Outreach (Signed)
[facility]) Care Management  11/15/2018  KYLEY LAUREL 1945/07/08 [id number]   [place] Cardioversion Outreach  Referral Date:01/18/2018 Referral Source:Transfer from [place] Reason for Referral:Continued Disease Management Education Insurance:[name]   Outreach Attempt:  Successful telephone outreach to patient for follow up after cardioversion.  HIPAA verified with patient.  Patient reporting successful cardioversion back to normal sinus rhythm.  States his shortness of breath and weakness is much better and he can tell his heart is back in rhythym.  Denies any increase in lower extremity swelling (same as normal due to bad knee and ankle).  Continues to weigh daily.  Weight this morning was 272 pounds (patient stating within his normal range).  Denies any shortness of breath and states his mobility is at his baseline.    Appointments:  Attended appointment at [facility] on 11/12/2018.  Plan: RN Health Coach will make next telephone outreach to patient within the month of October.  [place] [phone] [email or web address]

## 2018-11-26 ENCOUNTER — Other Ambulatory Visit: Payer: Self-pay | Admitting: Cardiology

## 2018-11-26 DIAGNOSIS — Z7901 Long term (current) use of anticoagulants: Secondary | ICD-10-CM | POA: Diagnosis not present

## 2018-11-26 DIAGNOSIS — I480 Paroxysmal atrial fibrillation: Secondary | ICD-10-CM | POA: Diagnosis not present

## 2018-12-04 ENCOUNTER — Other Ambulatory Visit: Payer: Self-pay | Admitting: *Deleted

## 2018-12-16 ENCOUNTER — Other Ambulatory Visit: Payer: Medicare HMO

## 2018-12-16 NOTE — Patient Outreach (Signed)
[facility]) Care Management  [place]  12/16/2018   Ruben Murray 1946-03-04 [id number]  Subjective: Telephone call to patient for monthly outreach.  Patient reports that he is fairly well.  He states he had a successful cardioversion.  He denies any episodes of A. Fibrillation. Discussed active A. Fibrillation signs and importance of seeking medical attention.  He verbalized understanding. Patient continues to weigh and watch for signs of heart failure. Encouraged patient to continue regimen and take medications as prescribed.  He verbalized understanding.    Objective:   Encounter Medications:  Outpatient Encounter Medications as of 12/16/2018  Medication Sig  . acetaminophen (TYLENOL) 500 MG tablet Take 500-1,000 mg by mouth daily as needed for moderate pain.   Marland Kitchen amiodarone (PACERONE) 200 MG tablet Take 1 tablet (200 mg total) by mouth daily.  . Carboxymethylcellul-Glycerin (LUBRICATING EYE DROPS OP) Place 1 drop into both eyes daily as needed (dry eyes).  . famotidine (PEPCID) 20 MG tablet One at bedtime (Patient taking differently: Take 20 mg by mouth at bedtime. One at bedtime)  . finasteride (PROSCAR) 5 MG tablet Take 5 mg by mouth daily.  . fluticasone (FLONASE) 50 MCG/ACT nasal spray Place 2 sprays into both nostrils 2 (two) times daily.  . furosemide (LASIX) 40 MG tablet Take 1 tablet (40 mg total) by mouth daily.  Marland Kitchen losartan (COZAAR) 50 MG tablet Take 1 tablet (50 mg total) by mouth daily. To replace Entresto.  . Melatonin 10 MG TABS Take 10 mg by mouth at bedtime as needed (sleep).  . metoprolol tartrate (LOPRESSOR) 100 MG tablet Take 0.5 tablets (50 mg total) by mouth See admin instructions. 1 tablet in the morning and 1.5 tablet in the evening  . Multiple Vitamin (MULTIVITAMIN WITH MINERALS) TABS tablet Take 1 tablet by mouth daily.  Marland Kitchen PHENobarbital (LUMINAL) 97.2 MG tablet Take 97.2 mg by mouth every morning.  . polyethylene glycol (MIRALAX /  GLYCOLAX) packet Take 17 g by mouth daily as needed. (Patient taking differently: Take 17 g by mouth daily as needed for moderate constipation. )  . potassium chloride (K-DUR) 10 MEQ tablet Take 2 tablets (20 mEq total) by mouth daily.  . pravastatin (PRAVACHOL) 40 MG tablet Take 40 mg by mouth at bedtime.   Marland Kitchen warfarin (COUMADIN) 3 MG tablet Take 3 mg by mouth See admin instructions. Alternate day with 3 mg dose  . warfarin (COUMADIN) 4 MG tablet Take 4 mg by mouth See admin instructions. Alternate with  3 mg dose every other day   No facility-administered encounter medications on file as of 12/16/2018.     Functional Status:  In your present state of health, do you have any difficulty performing the following activities: 06/07/2018  Hearing? Y  Comment trouble hearing  Vision? N  Difficulty concentrating or making decisions? N  Walking or climbing stairs? Y  Comment difficulties climbing stairs due to knees  Dressing or bathing? N  Doing errands, shopping? N  Preparing Food and eating ? N  Using the Toilet? N  In the past six months, have you accidently leaked urine? N  Do you have problems with loss of bowel control? N  Managing your Medications? Y  Comment wife assist with management of medications  Managing your Finances? N  Housekeeping or managing your Housekeeping? N  Some recent data might be hidden    Fall/Depression Screening: Fall Risk  11/15/2018 09/26/2018 06/07/2018  Falls in the past year? 0 0 0  Number  falls in past yr: - - -  Injury with Fall? - - -  Risk for fall due to : History of fall(s);Medication side effect;Impaired balance/gait;Impaired mobility;Impaired vision Medication side effect;Impaired balance/gait;Impaired mobility Impaired vision;Medication side effect;Impaired balance/gait;Impaired mobility;History of fall(s)  Follow up Falls evaluation completed;Education provided;Falls prevention discussed Falls evaluation completed;Education provided;Falls  prevention discussed Falls evaluation completed;Falls prevention discussed;Education provided   [facility] 2/9 Scores 06/07/2018 02/18/2018 11/15/2017  PHQ - 2 Score 0 1 0    Assessment: Patient with recent cardioversion.  Patient managing with heart failure.   Plan:  [facility] CM Care Plan Problem One     Most Recent Value  Care Plan Problem One  Continued learning related to diagnosis of heart failure and management of atrial fibrillation  Role Documenting the Problem One  Care Management Telephonic Coordinator  Care Plan for Problem One  Active  [facility] Long Term Goal   Patient will report no A. Fibrillation and will continue to watch for signs of Heart Failure within 90 days.  [facility] Long Term Goal Start Date  12/16/18  Interventions for Problem One Long Term Goal  Discussed with patient cardioversion procedure  Patient reports he has no A. Fibrillation.  Discussed importance of continuing to monitor for CHF.  Discussed signs of active A. Fibrillation and need to seek medical assistance.      RN CM will send update to physician. RN CM will outreach patient in the month of December and patient agreeable.   [name], RN, MSN [place] Management Care Management Coordinator Direct Line [phone] Cell [phone] Toll Free: [phone]  Fax: [phone]

## 2018-12-18 ENCOUNTER — Ambulatory Visit: Payer: Medicare HMO

## 2018-12-19 ENCOUNTER — Ambulatory Visit: Payer: Medicare HMO | Admitting: *Deleted

## 2018-12-23 ENCOUNTER — Telehealth: Payer: Self-pay | Admitting: Cardiology

## 2018-12-23 NOTE — Telephone Encounter (Signed)
New message:      Patient calling and would like to know why did Dr. [name] had him on Potassium 20 mg, but when he went to pick it up it was 10 mg.

## 2018-12-23 NOTE — Telephone Encounter (Signed)
Returned pt's call.   He has been made aware that his Potassium was decreased to 10 meq back in June. Pt just wanted to clarify if it was correct.

## 2018-12-26 DIAGNOSIS — Z7901 Long term (current) use of anticoagulants: Secondary | ICD-10-CM | POA: Diagnosis not present

## 2018-12-26 DIAGNOSIS — I4891 Unspecified atrial fibrillation: Secondary | ICD-10-CM | POA: Diagnosis not present

## 2019-01-15 ENCOUNTER — Encounter (HOSPITAL_COMMUNITY): Payer: Self-pay | Admitting: Physician Assistant

## 2019-01-15 ENCOUNTER — Other Ambulatory Visit: Payer: Self-pay

## 2019-01-15 ENCOUNTER — Ambulatory Visit (HOSPITAL_COMMUNITY)
Admission: RE | Admit: 2019-01-15 | Discharge: 2019-01-15 | Disposition: A | Discharge status: Home / Self Care | Payer: Medicare HMO | Source: Ambulatory Visit | Attending: Physician Assistant | Admitting: Physician Assistant

## 2019-01-15 ENCOUNTER — Other Ambulatory Visit (HOSPITAL_COMMUNITY): Payer: Self-pay | Admitting: *Deleted

## 2019-01-15 VITALS — BP 108/76 | HR 100 | Ht 62.0 in | Wt 271.0 lb

## 2019-01-15 DIAGNOSIS — I4891 Unspecified atrial fibrillation: Secondary | ICD-10-CM | POA: Diagnosis present

## 2019-01-15 DIAGNOSIS — Z888 Allergy status to other drugs, medicaments and biological substances status: Secondary | ICD-10-CM | POA: Diagnosis not present

## 2019-01-15 DIAGNOSIS — I4510 Unspecified right bundle-branch block: Secondary | ICD-10-CM | POA: Diagnosis not present

## 2019-01-15 DIAGNOSIS — G4733 Obstructive sleep apnea (adult) (pediatric): Secondary | ICD-10-CM | POA: Insufficient documentation

## 2019-01-15 DIAGNOSIS — I4819 Other persistent atrial fibrillation: Secondary | ICD-10-CM | POA: Insufficient documentation

## 2019-01-15 DIAGNOSIS — Z8249 Family history of ischemic heart disease and other diseases of the circulatory system: Secondary | ICD-10-CM | POA: Diagnosis not present

## 2019-01-15 DIAGNOSIS — Z79899 Other long term (current) drug therapy: Secondary | ICD-10-CM | POA: Insufficient documentation

## 2019-01-15 DIAGNOSIS — I5042 Chronic combined systolic (congestive) and diastolic (congestive) heart failure: Secondary | ICD-10-CM | POA: Diagnosis not present

## 2019-01-15 DIAGNOSIS — E785 Hyperlipidemia, unspecified: Secondary | ICD-10-CM | POA: Insufficient documentation

## 2019-01-15 DIAGNOSIS — R569 Unspecified convulsions: Secondary | ICD-10-CM | POA: Diagnosis not present

## 2019-01-15 DIAGNOSIS — D6869 Other thrombophilia: Secondary | ICD-10-CM

## 2019-01-15 DIAGNOSIS — Z7901 Long term (current) use of anticoagulants: Secondary | ICD-10-CM | POA: Diagnosis not present

## 2019-01-15 DIAGNOSIS — Z96652 Presence of left artificial knee joint: Secondary | ICD-10-CM | POA: Diagnosis not present

## 2019-01-15 DIAGNOSIS — Z885 Allergy status to narcotic agent status: Secondary | ICD-10-CM | POA: Insufficient documentation

## 2019-01-15 DIAGNOSIS — I110 Hypertensive heart disease with heart failure: Secondary | ICD-10-CM | POA: Diagnosis not present

## 2019-01-15 DIAGNOSIS — Z6841 Body Mass Index (BMI) 40.0 and over, adult: Secondary | ICD-10-CM | POA: Insufficient documentation

## 2019-01-15 DIAGNOSIS — Z87891 Personal history of nicotine dependence: Secondary | ICD-10-CM | POA: Insufficient documentation

## 2019-01-15 DIAGNOSIS — I252 Old myocardial infarction: Secondary | ICD-10-CM | POA: Insufficient documentation

## 2019-01-15 HISTORY — DX: Other thrombophilia: D68.69

## 2019-01-15 LAB — COMPREHENSIVE METABOLIC PANEL
ALT: 27 U/L (ref 0–44)
AST: 30 U/L (ref 15–41)
Albumin: 3.8 g/dL (ref 3.5–5.0)
Alkaline Phosphatase: 101 U/L (ref 38–126)
Anion gap: 8 (ref 5–15)
BUN: 15 mg/dL (ref 8–23)
CO2: 30 mmol/L (ref 22–32)
Calcium: 8.8 mg/dL — ABNORMAL LOW (ref 8.9–10.3)
Chloride: 104 mmol/L (ref 98–111)
Creatinine, Ser: 1.08 mg/dL (ref 0.61–1.24)
GFR calc Af Amer: >60 mL/min (ref >60)
GFR calc non Af Amer: >60 mL/min (ref >60)
Glucose, Bld: 105 mg/dL — ABNORMAL HIGH (ref 70–99)
Potassium: 4.4 mmol/L (ref 3.5–5.1)
Sodium: 142 mmol/L (ref 135–145)
Total Bilirubin: 0.7 mg/dL (ref 0.3–1.2)
Total Protein: 6.7 g/dL (ref 6.5–8.1)

## 2019-01-15 LAB — TSH: TSH: 2.613 u[IU]/mL (ref 0.350–4.500)

## 2019-01-15 MED ORDER — AMIODARONE HCL 200 MG PO TABS: 200.0000 mg | ORAL_TABLET | Freq: Two times a day (BID) | ORAL | 1 refills | Status: DC

## 2019-01-15 MED ORDER — AMIODARONE HCL 200 MG PO TABS: 200.0000 mg | ORAL_TABLET | Freq: Two times a day (BID) | ORAL | 0 refills | Status: DC

## 2019-01-15 NOTE — Progress Notes (Signed)
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 male with a h/o obesity, htn, untreated sleep apnea, seizures, that had recently been treated at [facility] for CAP 1/1-1/5. He had f/u with his PCP 2/14 and was found to be in new onset afib. Being seen in [facility] 2/15.  He had also been treated with prednisone. He has OSA but has not used cpap in several months. States that it is broken and needs to see if can get a new machine. No alcohol or tobacco. Moderate caffeine use.  Is sedentary and obese. Pt has seen Dr. Maxwell Caul for treatment of his sleep apnea. Patient s/p DCCV 12/2017 with return of AF. He continued to have symptoms of afib. No triggers that the patient could identify. Pt failed DCCV on 09/25/18. Now s/p repeat DCCV on 11/04/18 after loading on amiodarone. He is on warfarin for a CHADS2VASC score of 4.  On follow up today, patient reports that he has done reasonably well with no heart racing or palpitations. His chronic SOB is at baseline. He denies any symptoms of fluid overload. He denies bleeding on anticoagulation. He is surprised to hear he is out of rhythm today. There were no specific triggers that the patient could identify.   Today, he denies symptoms of palpitations, chest pain, orthopnea, PND, dizziness, presyncope, syncope, or neurologic sequela. The patient is tolerating medications without difficulties and is otherwise without complaint today. +daytime somnolence  Past Medical History:  Diagnosis Date  . Aortic atherosclerosis ([place])   . CAP (community acquired pneumonia) 03/06/2016  . Cardiomyopathy ([place])    a. EF 40-45% in 04/2016, etiology not defined, managed medically.  . Chronic combined systolic and diastolic CHF (congestive heart failure) ([place])   . Hyperlipidemia   . Hypertension   . Morbid obesity ([place])   . Myocardial infarction [facility]) 1980-early 2000s X 2   "mild  ones" (03/06/2016)  . OSA on CPAP    a. pt states was told by Dr. Maxwell Caul that he would not require CPAP as long as he continued to sleep in recliner which he does for his back.  . Paroxysmal atrial fibrillation ([place])   . Pneumonia    "when I was a kid" (03/06/2016)  . RBBB   . Seizures ([place])    "take RX daily" (03/06/2016)   Past Surgical History:  Procedure Laterality Date  . CARDIOVERSION N/A 12/13/2017   Procedure: CARDIOVERSION;  Surgeon: [name], MD;  Location: [facility] ENDOSCOPY;  Service: Cardiovascular;  Laterality: N/A;  . CARDIOVERSION N/A 09/25/2018   Procedure: CARDIOVERSION;  Surgeon: [name], MD;  Location[facility] ENDOSCOPY;  Service: Cardiovascular;  Laterality: N/A;  . CARDIOVERSION N/A 11/04/2018   Procedure: CARDIOVERSION;  Surgeon: [name], MD;  Location: [place];  Service: Cardiovascular;  Laterality: N/A;  . CIRCUMCISION    . JOINT REPLACEMENT    . PERCUTANEOUS PINNING TOE FRACTURE Left    "big toe  . REPLACEMENT TOTAL KNEE Left   . TONSILLECTOMY AND ADENOIDECTOMY      Current Outpatient Medications  Medication Sig Dispense Refill  . acetaminophen (TYLENOL) 500 MG tablet Take 500-1,000 mg by mouth daily as needed for moderate pain.     Marland Kitchen amiodarone (PACERONE) 200 MG tablet Take 1 tablet (200 mg total) by mouth daily. 30 tablet 3  . Carboxymethylcellul-Glycerin (LUBRICATING EYE DROPS OP) Place 1 drop into both eyes daily as needed (dry  eyes).    . famotidine (PEPCID) 20 MG tablet One at bedtime (Patient taking differently: Take 20 mg by mouth at bedtime. One at bedtime) 30 tablet 2  . finasteride (PROSCAR) 5 MG tablet Take 5 mg by mouth daily.    . fluticasone (FLONASE) 50 MCG/ACT nasal spray Place 2 sprays into both nostrils 2 (two) times daily.    . furosemide (LASIX) 40 MG tablet Take 1 tablet (40 mg total) by mouth daily. 90 tablet 3  . losartan (COZAAR) 50 MG tablet Take 1 tablet (50 mg total) by mouth daily. To replace Entresto. 30 tablet 6   . Melatonin 10 MG TABS Take 10 mg by mouth at bedtime as needed (sleep).    . metoprolol tartrate (LOPRESSOR) 100 MG tablet Take 0.5 tablets (50 mg total) by mouth See admin instructions. 1 tablet in the morning and 1.5 tablet in the evening 45 tablet 3  . Multiple Vitamin (MULTIVITAMIN WITH MINERALS) TABS tablet Take 1 tablet by mouth daily.    Marland Kitchen PHENobarbital (LUMINAL) 97.2 MG tablet Take 97.2 mg by mouth every morning.    . polyethylene glycol (MIRALAX / GLYCOLAX) packet Take 17 g by mouth daily as needed. (Patient taking differently: Take 17 g by mouth daily as needed for moderate constipation. ) 14 each 0  . potassium chloride (K-DUR) 10 MEQ tablet Take 2 tablets (20 mEq total) by mouth daily. 90 tablet 3  . pravastatin (PRAVACHOL) 40 MG tablet Take 40 mg by mouth at bedtime.     Marland Kitchen warfarin (COUMADIN) 3 MG tablet Take 3 mg by mouth See admin instructions. Alternate day with 3 mg dose    . warfarin (COUMADIN) 4 MG tablet Take 4 mg by mouth See admin instructions. Alternate with  3 mg dose every other day     No current facility-administered medications for this visit.     Allergies  Allergen Reactions  . Dilaudid [Hydromorphone Hcl] Other (See Comments)    Makes pt hyper   . Tegretol [Carbamazepine] Hives    Social History   Socioeconomic History  . Marital status: Married    Spouse name: Not on file  . Number of children: Not on file  . Years of education: Not on file  . Highest education level: Not on file  Occupational History  . Not on file  Social Needs  . Financial resource strain: Somewhat hard  . Food insecurity    Worry: Sometimes true    Inability: Sometimes true  . Transportation needs    Medical: No    Non-medical: No  Tobacco Use  . Smoking status: Former Smoker    Packs/day: 3.00    Years: 48.00    Pack years: 144.00    Types: Cigarettes    Quit date: 2000    Years since quitting: 20.8  . Smokeless tobacco: Never Used  Substance and Sexual Activity   . Alcohol use: No  . Drug use: No  . Sexual activity: Never  Lifestyle  . Physical activity    Days per week: 0 days    Minutes per session: 0 min  . Stress: To some extent  Relationships  . Social Herbalist on phone: Not on file    Gets together: Not on file    Attends religious service: Not on file    Active member of club or organization: Not on file    Attends meetings of clubs or organizations: Not on file    Relationship status:  Not on file  . Intimate partner violence    Fear of current or ex partner: Not on file    Emotionally abused: Not on file    Physically abused: Not on file    Forced sexual activity: Not on file  Other Topics Concern  . Not on file  Social History Narrative  . Not on file    Family History  Problem Relation Age of Onset  . Hypertension Mother   . Other Father        sudden cardiac arrest    ROS- All systems are reviewed and negative except as per the HPI above  Physical Exam: There were no vitals filed for this visit. Wt Readings from Last 3 Encounters:  11/12/18 281 lb 9.6 oz (127.7 kg)  11/04/18 272 lb (123.4 kg)  10/16/18 272 lb (123.4 kg)    Labs: Lab Results  Component Value Date   NA 142 10/28/2018   K 4.4 10/28/2018   CL 104 10/28/2018   CO2 29 10/28/2018   GLUCOSE 89 10/28/2018   BUN 19 10/28/2018   CREATININE 0.99 10/28/2018   CALCIUM 8.6 (L) 10/28/2018   PHOS 3.7 10/30/2017   MG 2.1 12/27/2017   Lab Results  Component Value Date   INR 3.6 (H) 12/10/2017   Lab Results  Component Value Date   CHOL 130 11/02/2017   HDL 31 (L) 11/02/2017   LDLCALC 83 11/02/2017   TRIG 81 11/02/2017   GEN- The patient is well appearing obese elderly male, alert and oriented x 3 today.   HEENT-head normocephalic, atraumatic, sclera clear, conjunctiva pink, hearing intact, trachea midline. Lungs- Clear to ausculation bilaterally, normal work of breathing Heart- iregular rate and rhythm, no murmurs, rubs or gallops   GI- soft, NT, ND, + BS Extremities- no clubbing, cyanosis, or edema MS- no significant deformity or atrophy Skin- no rash or lesion Psych- euthymic mood, full affect Neuro- strength and sensation are intact   EKG- afib HR 100, LAD, RBBB, QRS 154, QTc 485  Epic records reviewed  Echo 03/18/18 - Left ventricle: The cavity size was normal. Wall thickness was   normal. Systolic function was mildly to moderately reduced. The   estimated ejection fraction was in the range of 40% to 45%. Wall   motion was normal; there were no regional wall motion   abnormalities. Doppler parameters are consistent with abnormal   left ventricular relaxation (grade 1 diastolic dysfunction). - Right ventricle: The cavity size was mildly dilated. - Right atrium: The atrium was mildly dilated. - Pulmonary arteries: Systolic pressure was mildly to moderately   increased. PA peak pressure: 40 mm Hg (S).   Assessment and Plan: 1. Persistent atrial fibrillation   S/p failed DCCV on 09/25/18. Repeat DCCV on 11/04/18. Unfortunately, patient is back in afib. Will increase amiodarone 200 mg BID for now. Patient would like to avoid another DCCV if possible. We discussed the possibility that if amiodarone fails to keep him in SR, he may be a candidate for rate control vs AV node ablation. Will also focus on lifestyle modification. Check Cmet/TSH today. Continue warfarin  Continue Lopressor 50 mg BID Recall due to presence of phenobarbital will not be able to take eliquis or xarelto due to drug/drug interaction.  This patients CHA2DS2-VASc Score and unadjusted Ischemic Stroke Rate (% per year) is equal to 4.8 % stroke rate/year from a score of 4  Above score calculated as 1 point each if present [CHF, HTN, DM, Vascular=MI/PAD/Aortic Plaque, Age  if 65-74, or Male] Above score calculated as 2 points each if present [Age > 75, or Stroke/TIA/TE]   2. OSA Not currently on CPAP as patient always sleeps in recliner.  Encouraged patient to get back in to see Dr Isidoro Donning regarding his OSA given his persistent afib and symptoms of daytime somnolence. He may need to be restarted on CPAP.  3. Combined chronic systolic and diastolic CHF Weight stable, no signs or symptoms of fluid overload. Continue present therapy.   Follow up with Dr [name] per recall. Will also refer to EP.   [facility] [address] [place], [place] [address] [phone]

## 2019-01-15 NOTE — Patient Instructions (Signed)
Increase Amiodarone 284m twice a day with food

## 2019-01-27 DIAGNOSIS — Z7901 Long term (current) use of anticoagulants: Secondary | ICD-10-CM | POA: Diagnosis not present

## 2019-01-27 DIAGNOSIS — I480 Paroxysmal atrial fibrillation: Secondary | ICD-10-CM | POA: Diagnosis not present

## 2019-02-12 ENCOUNTER — Other Ambulatory Visit: Payer: Self-pay

## 2019-02-12 NOTE — Patient Outreach (Signed)
[facility]) Care Management  [place]  02/12/2019   Ruben Murray March 21, 1945 [id number]  Subjective: Telephone call to patient.  He reports he is doing fairly well.  He reports that he is back in A. Fibrillation.  He states the doctor is treating with medications as he has had 4 cardioversions in the past.   He states he also has an appointment for his OSA in January.  Discussed A. Fibrillation and OSA relation.  Discussed signs of heart failure and worsening A. Fibrillation.  He verbalized understanding and continues to weigh daily. Patient weight 270 lbs.  Patient denies any needs.      Objective:   Encounter Medications:  Outpatient Encounter Medications as of 02/12/2019  Medication Sig  . acetaminophen (TYLENOL) 500 MG tablet Take 500-1,000 mg by mouth daily as needed for moderate pain.   Marland Kitchen amiodarone (PACERONE) 200 MG tablet Take 1 tablet (200 mg total) by mouth 2 (two) times daily.  . Carboxymethylcellul-Glycerin (LUBRICATING EYE DROPS OP) Place 1 drop into both eyes daily as needed (dry eyes).  . famotidine (PEPCID) 20 MG tablet One at bedtime (Patient taking differently: Take 20 mg by mouth at bedtime. One at bedtime)  . finasteride (PROSCAR) 5 MG tablet Take 5 mg by mouth daily.  . fluticasone (FLONASE) 50 MCG/ACT nasal spray Place 2 sprays into both nostrils 2 (two) times daily.  . furosemide (LASIX) 40 MG tablet Take 1 tablet (40 mg total) by mouth daily.  Marland Kitchen losartan (COZAAR) 50 MG tablet Take 1 tablet (50 mg total) by mouth daily. To replace Entresto.  . metoprolol tartrate (LOPRESSOR) 100 MG tablet Take 0.5 tablets (50 mg total) by mouth See admin instructions. 1 tablet in the morning and 1.5 tablet in the evening  . PHENobarbital (LUMINAL) 97.2 MG tablet Take 97.2 mg by mouth every morning.  . polyethylene glycol (MIRALAX / GLYCOLAX) packet Take 17 g by mouth daily as needed. (Patient taking differently: Take 17 g by mouth daily as needed for  moderate constipation. )  . potassium chloride (K-DUR) 10 MEQ tablet Take 2 tablets (20 mEq total) by mouth daily.  . pravastatin (PRAVACHOL) 40 MG tablet Take 40 mg by mouth at bedtime.   Marland Kitchen warfarin (COUMADIN) 3 MG tablet Take 3 mg by mouth See admin instructions. Alternate day with 3 mg dose  . warfarin (COUMADIN) 4 MG tablet Take 4 mg by mouth See admin instructions. Alternate with  3 mg dose every other day   No facility-administered encounter medications on file as of 02/12/2019.     Functional Status:  In your present state of health, do you have any difficulty performing the following activities: 06/07/2018  Hearing? Y  Comment trouble hearing  Vision? N  Difficulty concentrating or making decisions? N  Walking or climbing stairs? Y  Comment difficulties climbing stairs due to knees  Dressing or bathing? N  Doing errands, shopping? N  Preparing Food and eating ? N  Using the Toilet? N  In the past six months, have you accidently leaked urine? N  Do you have problems with loss of bowel control? N  Managing your Medications? Y  Comment wife assist with management of medications  Managing your Finances? N  Housekeeping or managing your Housekeeping? N  Some recent data might be hidden    Fall/Depression Screening: Fall Risk  11/15/2018 09/26/2018 06/07/2018  Falls in the past year? 0 0 0  Number falls in past yr: - - -  Injury with Fall? - - -  Risk for fall due to : History of fall(s);Medication side effect;Impaired balance/gait;Impaired mobility;Impaired vision Medication side effect;Impaired balance/gait;Impaired mobility Impaired vision;Medication side effect;Impaired balance/gait;Impaired mobility;History of fall(s)  Follow up Falls evaluation completed;Education provided;Falls prevention discussed Falls evaluation completed;Education provided;Falls prevention discussed Falls evaluation completed;Falls prevention discussed;Education provided   [facility] 2/9 Scores 06/07/2018  02/18/2018 11/15/2017  PHQ - 2 Score 0 1 0    Assessment: Patient continues to manage chronic illnesses.  Plan:  [facility] CM Care Plan Problem One     Most Recent Value  Care Plan Problem One  Continued learning related to diagnosis of heart failure and management of atrial fibrillation  Role Documenting the Problem One  Care Management Telephonic Coordinator  Care Plan for Problem One  Active  [facility] Long Term Goal   Patient will report no A. Fibrillation and will continue to watch for signs of Heart Failure within 90 days.  [facility] Long Term Goal Start Date  02/12/19  Interventions for Problem One Long Term Goal  Patient back in A. Fib.  Patient being treated medically.  Appointment for OSA in January.   RN CM reviewed with patient ways of monitoring heart failure, signs & symptoms, and when to notify physician.       RN CM will contact patient again in the month of February and patient agreeable.    [name], RN, MSN [facility] Care Management Coordinator Direct Line [phone] Cell [phone] Toll Free: [phone]  Fax: [phone]

## 2019-02-13 ENCOUNTER — Ambulatory Visit: Payer: Self-pay

## 2019-02-24 DIAGNOSIS — I4891 Unspecified atrial fibrillation: Secondary | ICD-10-CM | POA: Diagnosis not present

## 2019-02-24 DIAGNOSIS — Z7901 Long term (current) use of anticoagulants: Secondary | ICD-10-CM | POA: Diagnosis not present

## 2019-03-11 ENCOUNTER — Other Ambulatory Visit: Payer: Self-pay

## 2019-03-11 ENCOUNTER — Encounter: Payer: Self-pay | Admitting: Cardiology

## 2019-03-11 ENCOUNTER — Encounter (INDEPENDENT_AMBULATORY_CARE_PROVIDER_SITE_OTHER): Payer: Self-pay

## 2019-03-11 ENCOUNTER — Ambulatory Visit: Payer: Medicare HMO | Admitting: Cardiology

## 2019-03-11 VITALS — BP 112/76 | HR 89 | Ht 62.0 in | Wt 255.0 lb

## 2019-03-11 DIAGNOSIS — I480 Paroxysmal atrial fibrillation: Secondary | ICD-10-CM | POA: Diagnosis not present

## 2019-03-11 DIAGNOSIS — Z01812 Encounter for preprocedural laboratory examination: Secondary | ICD-10-CM | POA: Diagnosis not present

## 2019-03-11 DIAGNOSIS — Z1211 Encounter for screening for malignant neoplasm of colon: Secondary | ICD-10-CM | POA: Diagnosis not present

## 2019-03-11 DIAGNOSIS — I4819 Other persistent atrial fibrillation: Secondary | ICD-10-CM

## 2019-03-11 DIAGNOSIS — Z7901 Long term (current) use of anticoagulants: Secondary | ICD-10-CM | POA: Diagnosis not present

## 2019-03-11 NOTE — Patient Instructions (Addendum)
Medication Instructions:  Your physician recommends that you continue on your current medications as directed. Please refer to the Current Medication list given to you today.  * If you need a refill on your cardiac medications before your next appointment, please call your pharmacy.   Labwork: Pre procedure labs today: BMET & CBC If you have labs (blood work) drawn today and your tests are completely normal, you will receive your results only by:  MyChart Message (if you have MyChart) OR  A paper copy in the mail If you have any lab test that is abnormal or we need to change your treatment, we will call you to review the results.  Testing/Procedures: Your physician has recommended that you have a Cardioversion (DCCV). Electrical Cardioversion uses a jolt of electricity to your heart either through paddles or wired patches attached to your chest. This is a controlled, usually prescheduled, procedure. Defibrillation is done under light anesthesia in the hospital, and you usually go home the day of the procedure. This is done to get your heart back into a normal rhythm. You are not awake for the procedure.    CARDIOVERSION INSTRUCTIONS Please arrive at the [facility], main entrance of [facility] hospital on 03/17/2019 at  8:30 am Do not eat or drink after midnight prior to procedure Hold the following medications the morning of procedure:  1. Lasix Take your remaining medications as normal, with sip of water (enough to get medications down) You will need someone to drive you home at discharge    Follow-Up: You are scheduled for follow up with Dr. Curt Bears on March 9th, 2021 @ 3:45 pm  Thank you for choosing [facility]!!   Trinidad Curet, RN [phone]  Any Other Special Instructions Will Be Listed Below (If Applicable).    COVID TEST-- On 03/14/19 @ 2:00 pm- You will go to [facility] for your Covid testing.   This is a drive thru test  site.  There will be multiple testing areas.  Be sure to share with the first checkpoint that you are there for pre-procedure/surgery testing. This will put you into the right (yellow) lane that leads to the PAT testing team. Stay in your car and the nurse team will come to your car to test you.  After you are tested please go home and self quarantine until the day of your procedure.

## 2019-03-11 NOTE — Progress Notes (Signed)
Electrophysiology Office Note   Date:  03/11/2019   ID:  Ruben, Murray 05/18/1945, MRN [id number]  PCP:  [name], MD  Cardiologist:  [name] Primary Electrophysiologist:  [name], MD    Chief Complaint: AF   History of Present Illness: Ruben Murray is a 74 y.o. male who is being seen today for the evaluation of AF at the request of Ruben Murray. Presenting today for electrophysiology evaluation.  He has a history of obesity, hypertension, untreated sleep apnea, seizures.  January 2020 he was treated to the hospital for community-acquired pneumonia and was found to be in new onset atrial fibrillation.  The patient failed cardioversion 09/25/2018.  He had repeat cardioversion 11/04/2018 after being loaded on amiodarone.  He is currently on warfarin.  Today, he denies symptoms of palpitations, chest pain,  orthopnea, PND, lower extremity edema, claudication, dizziness, presyncope, syncope, bleeding, or neurologic sequela. The patient is tolerating medications without difficulties. His main complaint today is of shortness of breath, weakness, fatigue. He says that after his cardioversions, he felt well, but a few days later when he went back into atrial fibrillation he felt quite poorly. He has trouble walking around his house due to his weakness and fatigue. He does sleep in a chair due to his back issues. He is planning to see his sleep doctor to be evaluated for sleep apnea well in his chair.   Past Medical History:  Diagnosis Date  . Acquired thrombophilia ([place]) 01/15/2019  . Aortic atherosclerosis ([place])   . Atrial fibrillation with RVR ([place]) 10/29/2017  . CAP (community acquired pneumonia) 03/06/2016  . Cardiomyopathy ([place])    a. EF 40-45% in 04/2016, etiology not defined, managed medically.  . CHF exacerbation ([place]) 10/29/2017  . Chronic laryngitis 10/03/2016  . COPD GOLD II  08/02/2016   Quit smoking 2000  PFT's  07/10/2016  FEV1 1.98 (70 % ) ratio 67  p 19 %  improvement from saba p nothing  prior to study while of coreg so rec as of 08/02/2016 try off coreg and on bisoprolol     . Dysphonia 10/03/2016  . Essential hypertension 03/06/2016   Changed from coreg to bisoprolol due to copd with reversible component  08/02/2016 >>>   . Fever 03/12/2015  . Hoarseness 08/31/2016   Referred to ent 08/31/2016 >>> seen 10/03/16 [name] dx gerd and rhinitis medicamentosa   > improved on f/u 01/31/17 on gerd rx/ flonase and off afrin  . Hyperlipidemia   . Hypertension   . Laryngopharyngeal reflux (LPR) 10/03/2016  . Moderate persistent asthma 08/02/2016  . Morbid (severe) obesity due to excess calories ([place]) 08/02/2016  . Myocardial infarction [facility]) 1980-early 2000s X 2   "mild ones" (03/06/2016)  . Nasal polyps 10/03/2016  . OSA on CPAP    a. pt states was told by Dr. Maxwell Caul that he would not require CPAP as long as he continued to sleep in recliner which he does for his back.  . Paroxysmal atrial fibrillation ([place])   . Pneumonia    "when I was a kid" (03/06/2016)  . Prolonged QT interval 10/29/2017  . RBBB   . 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 Rhinitis medicamentosa 10/03/2016  . Right thyroid nodule 03/06/2016  . Seizures ([place])    "take RX daily" (03/06/2016)  . Sensorineural hearing loss (SNHL) of both ears 01/31/2017   Past Surgical History:  Procedure Laterality Date  . CARDIOVERSION N/A 12/13/2017   Procedure: CARDIOVERSION;  Surgeon: Oval Linsey,  [name], MD;  Location: [place];  Service: Cardiovascular;  Laterality: N/A;  . CARDIOVERSION N/A 09/25/2018   Procedure: CARDIOVERSION;  Surgeon: [name], MD;  Location: [facility] ENDOSCOPY;  Service: Cardiovascular;  Laterality: N/A;  . CARDIOVERSION N/A 11/04/2018   Procedure: CARDIOVERSION;  Surgeon: [name], MD;  Location: [place];  Service: Cardiovascular;  Laterality: N/A;  . CIRCUMCISION    . JOINT REPLACEMENT    . PERCUTANEOUS PINNING TOE FRACTURE Left    "big toe  . REPLACEMENT TOTAL  KNEE Left   . TONSILLECTOMY AND ADENOIDECTOMY       Current Outpatient Medications  Medication Sig Dispense Refill  . acetaminophen (TYLENOL) 500 MG tablet Take 500-1,000 mg by mouth daily as needed for moderate pain.     Marland Kitchen amiodarone (PACERONE) 200 MG tablet Take 1 tablet (200 mg total) by mouth 2 (two) times daily. 180 tablet 0  . Carboxymethylcellul-Glycerin (LUBRICATING EYE DROPS OP) Place 1 drop into both eyes daily as needed (dry eyes).    . famotidine (PEPCID) 20 MG tablet One at bedtime 30 tablet 2  . finasteride (PROSCAR) 5 MG tablet Take 5 mg by mouth daily.    . fluticasone (FLONASE) 50 MCG/ACT nasal spray Place 2 sprays into both nostrils 2 (two) times daily.    . furosemide (LASIX) 40 MG tablet Take 1 tablet (40 mg total) by mouth daily. 90 tablet 3  . losartan (COZAAR) 50 MG tablet Take 1 tablet (50 mg total) by mouth daily. To replace Entresto. 30 tablet 6  . metoprolol tartrate (LOPRESSOR) 100 MG tablet Take 50 mg by mouth 2 (two) times daily.    Marland Kitchen PHENobarbital (LUMINAL) 97.2 MG tablet Take 97.2 mg by mouth every morning.    . polyethylene glycol (MIRALAX / GLYCOLAX) packet Take 17 g by mouth daily as needed. 14 each 0  . potassium chloride (KLOR-CON) 10 MEQ tablet Take 10 mEq by mouth daily.    . pravastatin (PRAVACHOL) 40 MG tablet Take 40 mg by mouth at bedtime.     Marland Kitchen warfarin (COUMADIN) 3 MG tablet Take 3 mg by mouth See admin instructions. Alternate day with 3 mg dose    . warfarin (COUMADIN) 4 MG tablet Take 4 mg by mouth See admin instructions. Alternate with  3 mg dose every other day     No current facility-administered medications for this visit.    Allergies:   Dilaudid [hydromorphone hcl] and Tegretol [carbamazepine]   Social History:  The patient  reports that he quit smoking about 21 years ago. His smoking use included cigarettes. He has a 144.00 pack-year smoking history. He has never used smokeless tobacco. He reports that he does not drink alcohol or use  drugs.   Family History:  The patient's family history includes Hypertension in his mother; Other in his father.    ROS:  Please see the history of present illness.   Otherwise, review of systems is positive for none.   All other systems are reviewed and negative.    PHYSICAL EXAM: VS:  BP 112/76   Pulse 89   Ht _0  (1.575 m)   Wt 255 lb (115.7 kg)   SpO2 95%   BMI 46.64 kg/m  , BMI Body mass index is 46.64 kg/m. GEN: Well nourished, well developed, in no acute distress  HEENT: normal  Neck: no JVD, carotid bruits, or masses Cardiac: irregular; no murmurs, rubs, or gallops,no edema  Respiratory:  clear to auscultation bilaterally, normal work of  breathing GI: soft, nontender, nondistended, + BS MS: no deformity or atrophy  Skin: warm and dry Neuro:  Strength and sensation are intact Psych: euthymic mood, full affect  EKG:  EKG is ordered today. Personal review of the ekg ordered shows atrial fibrillation, right bundle branch block, rate 89  Recent Labs: 08/20/2018: NT-Pro BNP 433 10/28/2018: Hemoglobin 15.6; Platelets 195 01/15/2019: ALT 27; BUN 15; Creatinine, Ser 1.08; Potassium 4.4; Sodium 142; TSH 2.613    Lipid Panel     Component Value Date/Time   CHOL 130 11/02/2017 0425   TRIG 81 11/02/2017 0425   HDL 31 (L) 11/02/2017 0425   CHOLHDL 4.2 11/02/2017 0425   VLDL 16 11/02/2017 0425   LDLCALC 83 11/02/2017 0425     Wt Readings from Last 3 Encounters:  03/11/19 255 lb (115.7 kg)  01/15/19 271 lb (122.9 kg)  11/12/18 281 lb 9.6 oz (127.7 kg)      Other studies Reviewed: Additional studies/ records that were reviewed today include: TTE 01/15/19  Review of the above records today demonstrates:  - Left ventricle: The cavity size was normal. Wall thickness was normal. Systolic function was mildly to moderately reduced. The estimated ejection fraction was in the range of 40% to 45%. Wall motion was normal; there were no regional wall  motion abnormalities. Doppler parameters are consistent with abnormal left ventricular relaxation (grade 1 diastolic dysfunction). - Right ventricle: The cavity size was mildly dilated. - Right atrium: The atrium was mildly dilated. - Pulmonary arteries: Systolic pressure was mildly to moderately increased. PA peak pressure: 40 mm Hg (S).   ASSESSMENT AND PLAN:  1.  Persistent atrial fibrillation: Currently on amiodarone and warfarin.  CHA2DS2-VASc of 4. Is quite symptomatic from his atrial fibrillation. Due to that, we Dalin Caldera plan for repeat cardioversion to see if he Gaelan Glennon stay in rhythm. If he does not stay in rhythm, I have offered him the option of ablation.  2.  Obstructive sleep apnea: We Londen Lorge refer back to his sleep apnea physician.  He likely needs to be restarted on CPAP if atrial fibrillation control is to be successful.  3.  Chronic combined systolic and diastolic heart failure: No obvious volume overload  Case discussed with primary cardiology  Current medicines are reviewed at length with the patient today.   The patient does not have concerns regarding his medicines.  The following changes were made today:  none  Labs/ tests ordered today include:  Orders Placed This Encounter  Procedures  . Basic metabolic panel  . CBC  . EKG 12-Lead     Disposition:   FU with Demarus Latterell 3 months  Signed, Robson Trickey Meredith Leeds, MD  03/11/2019 12:14 PM     [place] [address] [phone] (office) [phone] (fax)

## 2019-03-12 LAB — BASIC METABOLIC PANEL
BUN/Creatinine Ratio: 17 (ref 10–24)
BUN: 17 mg/dL (ref 8–27)
CO2: 26 mmol/L (ref 20–29)
Calcium: 8.5 mg/dL — ABNORMAL LOW (ref 8.6–10.2)
Chloride: 102 mmol/L (ref 96–106)
Creatinine, Ser: 1.03 mg/dL (ref 0.76–1.27)
GFR calc Af Amer: 82 mL/min/{1.73_m2} (ref >59)
GFR calc non Af Amer: 71 mL/min/{1.73_m2} (ref >59)
Glucose: 100 mg/dL — ABNORMAL HIGH (ref 65–99)
Potassium: 3.5 mmol/L (ref 3.5–5.2)
Sodium: 142 mmol/L (ref 134–144)

## 2019-03-12 LAB — CBC
Hematocrit: 43.3 % (ref 37.5–51.0)
Hemoglobin: 15.2 g/dL (ref 13.0–17.7)
MCH: 33.3 pg — ABNORMAL HIGH (ref 26.6–33.0)
MCHC: 35.1 g/dL (ref 31.5–35.7)
MCV: 95 fL (ref 79–97)
Platelets: 183 10*3/uL (ref 150–450)
RBC: 4.57 x10E6/uL (ref 4.14–5.80)
RDW: 12.7 % (ref 11.6–15.4)
WBC: 9.1 10*3/uL (ref 3.4–10.8)

## 2019-03-13 ENCOUNTER — Telehealth: Payer: Self-pay | Admitting: *Deleted

## 2019-03-13 ENCOUNTER — Other Ambulatory Visit (HOSPITAL_COMMUNITY): Payer: Medicare HMO

## 2019-03-13 NOTE — Telephone Encounter (Signed)
Called pt to inform him I was cancelling Covid screening for this afternoon and DCCV next week.  Explained that he needs 3 weeks of therapeutic INRs prior to DCCV (w/o needing TEE prior).  Pt verbalized understanding. Aware that I will reach out to Dr. Noland Fordyce office and ask them to arrange weekly INRs for next 3 weeks. Aware I will follow up in next week/two to reschedule DCCV. Patient verbalized understanding and agreeable to plan.

## 2019-03-14 ENCOUNTER — Other Ambulatory Visit (HOSPITAL_COMMUNITY): Payer: Medicare HMO

## 2019-03-17 ENCOUNTER — Encounter (HOSPITAL_COMMUNITY): Admission: RE | Payer: Self-pay | Source: Home / Self Care

## 2019-03-17 ENCOUNTER — Ambulatory Visit (HOSPITAL_COMMUNITY): Admission: RE | Admit: 2019-03-17 | Payer: Medicare HMO | Source: Home / Self Care | Admitting: Cardiology

## 2019-03-17 SURGERY — CARDIOVERSION
Anesthesia: General

## 2019-03-19 DIAGNOSIS — G4733 Obstructive sleep apnea (adult) (pediatric): Secondary | ICD-10-CM | POA: Diagnosis not present

## 2019-03-20 DIAGNOSIS — I480 Paroxysmal atrial fibrillation: Secondary | ICD-10-CM | POA: Diagnosis not present

## 2019-03-20 DIAGNOSIS — Z7901 Long term (current) use of anticoagulants: Secondary | ICD-10-CM | POA: Diagnosis not present

## 2019-03-21 ENCOUNTER — Telehealth: Payer: Self-pay | Admitting: Cardiology

## 2019-03-21 NOTE — Telephone Encounter (Signed)
New message   Patient is calling to follow up on when his cardioversion will be and is also wanting to make sure that his INR information is being received. Please call to discuss.

## 2019-03-21 NOTE — Telephone Encounter (Signed)
Returned pt call. Pt aware I received reading from this week, and last.  Received them today. Pt aware I will contact him soon to arrange DCCV for end of the month/beginning of February. Patient verbalized understanding and agreeable to plan.

## 2019-03-28 DIAGNOSIS — I4891 Unspecified atrial fibrillation: Secondary | ICD-10-CM | POA: Diagnosis not present

## 2019-03-28 DIAGNOSIS — Z7901 Long term (current) use of anticoagulants: Secondary | ICD-10-CM | POA: Diagnosis not present

## 2019-03-28 LAB — PROTIME-INR

## 2019-03-28 NOTE — Telephone Encounter (Signed)
Spoke to Dr. Noland Fordyce office.  Pt has not had INR drawn yet today, he scheduled now.  They will attempt to reach me once drawn, otherwise I will follow up to get result

## 2019-03-28 NOTE — Telephone Encounter (Signed)
Dr. Noland Fordyce office calls to inform me of today's INR. Recent INRs: 1/22 4.1 1/14 2.9 1/05 4.1 His office informed me that they decreased his Coumadin dosing today.  Informed pt that I would discuss w/ Dr. Curt Bears but most likely going to wait until next INR on 1/28 before scheduling a DCCV. Patient verbalized understanding and agreeable to plan.

## 2019-03-31 ENCOUNTER — Telehealth: Payer: Self-pay | Admitting: Cardiology

## 2019-03-31 NOTE — Telephone Encounter (Signed)
  Patient calling to find out if he will be having his surgery next week and if so will he have enough time to get his covid test for his surgery appointment. He states he has not yet had it.

## 2019-04-01 NOTE — Telephone Encounter (Addendum)
Returned pt call Informed that I have not called him to arrange DCCV d/t elevated INR last week. Informed that I would see if anything available end of next week for a procedure and we can schedule in hopes that INR will be closer to normal this week. Explained that we can push back date for DCCV if INR is abnormal this week. Pt agreeable to plan and understands I will call by Thursday.

## 2019-04-03 ENCOUNTER — Encounter: Payer: Self-pay | Admitting: Cardiology

## 2019-04-03 NOTE — Telephone Encounter (Signed)
Returned pt call, apologized for delayed call. Aware I will follow up on Monday to determine INR and if still proceeding w/ DCCV 2/5. Pt agreeable to plan.

## 2019-04-03 NOTE — Telephone Encounter (Signed)
error 

## 2019-04-04 ENCOUNTER — Other Ambulatory Visit: Payer: Self-pay | Admitting: Cardiovascular Disease

## 2019-04-07 DIAGNOSIS — I480 Paroxysmal atrial fibrillation: Secondary | ICD-10-CM | POA: Diagnosis not present

## 2019-04-07 DIAGNOSIS — Z7901 Long term (current) use of anticoagulants: Secondary | ICD-10-CM | POA: Diagnosis not present

## 2019-04-07 LAB — PROTIME-INR

## 2019-04-07 NOTE — Telephone Encounter (Signed)
Pt INR today at PCP was 3.5. Reviewed procedure instructions w/ pt Covid screening scheduled for tomorrow. Aware to keep f/u w/ [place] on 3/9.   Pt aware that if his blood is too thin and/or he is in NSR the day or procedure - then he will be sent home without performing procedure and rationale given for this. Patient verbalized understanding and agreeable to plan.

## 2019-04-08 ENCOUNTER — Ambulatory Visit: Payer: Medicare HMO | Admitting: Cardiology

## 2019-04-08 ENCOUNTER — Encounter: Payer: Self-pay | Admitting: Cardiology

## 2019-04-08 ENCOUNTER — Other Ambulatory Visit: Payer: Self-pay

## 2019-04-08 ENCOUNTER — Other Ambulatory Visit (HOSPITAL_COMMUNITY)
Admission: RE | Admit: 2019-04-08 | Discharge: 2019-04-08 | Disposition: A | Discharge status: Home / Self Care | Payer: Medicare HMO | Source: Ambulatory Visit | Attending: Cardiology | Admitting: Cardiology

## 2019-04-08 VITALS — BP 120/70 | HR 62 | Ht 68.0 in | Wt 276.0 lb

## 2019-04-08 DIAGNOSIS — I4819 Other persistent atrial fibrillation: Secondary | ICD-10-CM

## 2019-04-08 DIAGNOSIS — I5042 Chronic combined systolic (congestive) and diastolic (congestive) heart failure: Secondary | ICD-10-CM

## 2019-04-08 DIAGNOSIS — Z01812 Encounter for preprocedural laboratory examination: Secondary | ICD-10-CM | POA: Diagnosis not present

## 2019-04-08 DIAGNOSIS — R0602 Shortness of breath: Secondary | ICD-10-CM | POA: Diagnosis not present

## 2019-04-08 DIAGNOSIS — D6869 Other thrombophilia: Secondary | ICD-10-CM | POA: Diagnosis not present

## 2019-04-08 DIAGNOSIS — Z79899 Other long term (current) drug therapy: Secondary | ICD-10-CM

## 2019-04-08 DIAGNOSIS — Z20822 Contact with and (suspected) exposure to covid-19: Secondary | ICD-10-CM | POA: Diagnosis not present

## 2019-04-08 LAB — SARS CORONAVIRUS 2 (TAT 6-24 HRS): SARS Coronavirus 2: NEGATIVE

## 2019-04-08 NOTE — Patient Instructions (Addendum)
Medication Instructions:  The current medical regimen is effective;  continue present plan and medications.  *If you need a refill on your cardiac medications before your next appointment, please call your pharmacy*  Follow-Up: At [facility], you and your health needs are our priority.  As part of our continuing mission to provide you with exceptional heart care, we have created designated Provider Care Teams.  These Care Teams include your primary Cardiologist (physician) and Advanced Practice Providers (APPs -  Physician Assistants and Nurse Practitioners) who all work together to provide you with the care you need, when you need it.  Your next appointment:   6 month(s)  The format for your next appointment:   In Person  Provider:   Cecilie Kicks, NP and 1 year with Dr [name].  Thank you for choosing [facility]!!

## 2019-04-08 NOTE — Progress Notes (Signed)
Cardiology Office Note:    Date:  04/08/2019   ID:  Ruben Murray, DOB May 09, 1945, MRN [id number]  PCP:  [name], MD  Cardiologist:  [name], MD  Electrophysiologist:  None   Referring MD: [name], MD     History of Present Illness:    Ruben Murray is a 74 y.o. male with persistent atrial fibrillation here for follow-up prior to cardioversion.  Had repeat cardioversion 11/04/2018 after being loaded on amiodarone.  On warfarin for anticoagulation.  No bleeding.  Still feeling short of breath weak fatigue.  After cardioversions briefly he felt well.  Trying to restore sinus rhythm.  He is also seen Dr. Curt Bears with electrophysiology.  Seen A. fib clinic as well.  Has cardioversion this Friday.  Getting Covid test.  Lives in a 1 bedroom apartment and is short of breath with minimal activity.  Quit smoking 18 years ago.  Past Medical History:  Diagnosis Date  . Acquired thrombophilia ([place]) 01/15/2019  . Aortic atherosclerosis ([place])   . Atrial fibrillation with RVR ([place]) 10/29/2017  . CAP (community acquired pneumonia) 03/06/2016  . Cardiomyopathy ([place])    a. EF 40-45% in 04/2016, etiology not defined, managed medically.  . CHF exacerbation ([place]) 10/29/2017  . Chronic laryngitis 10/03/2016  . COPD GOLD II  08/02/2016   Quit smoking 2000  PFT's  07/10/2016  FEV1 1.98 (70 % ) ratio 67  p 19 % improvement from saba p nothing  prior to study while of coreg so rec as of 08/02/2016 try off coreg and on bisoprolol     . Dysphonia 10/03/2016  . Essential hypertension 03/06/2016   Changed from coreg to bisoprolol due to copd with reversible component  08/02/2016 >>>   . Fever 03/12/2015  . Hoarseness 08/31/2016   Referred to ent 08/31/2016 >>> seen 10/03/16 [name] dx gerd and rhinitis medicamentosa   > improved on f/u 01/31/17 on gerd rx/ flonase and off afrin  . Hyperlipidemia   . Hypertension   . Laryngopharyngeal reflux (LPR) 10/03/2016  . Moderate persistent asthma 08/02/2016  . Morbid  (severe) obesity due to excess calories ([place]) 08/02/2016  . Myocardial infarction [facility]) 1980-early 2000s X 2   "mild ones" (03/06/2016)  . Nasal polyps 10/03/2016  . OSA on CPAP    a. pt states was told by Dr. Maxwell Caul that he would not require CPAP as long as he continued to sleep in recliner which he does for his back.  . Paroxysmal atrial fibrillation ([place])   . Pneumonia    "when I was a kid" (03/06/2016)  . Prolonged QT interval 10/29/2017  . RBBB   . 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 Rhinitis medicamentosa 10/03/2016  . Right thyroid nodule 03/06/2016  . Seizures ([place])    "take RX daily" (03/06/2016)  . Sensorineural hearing loss (SNHL) of both ears 01/31/2017    Past Surgical History:  Procedure Laterality Date  . CARDIOVERSION N/A 12/13/2017   Procedure: CARDIOVERSION;  Surgeon: [name], MD;  Location: [facility] ENDOSCOPY;  Service: Cardiovascular;  Laterality: N/A;  . CARDIOVERSION N/A 09/25/2018   Procedure: CARDIOVERSION;  Surgeon: [name], MD;  Location: [facility] ENDOSCOPY;  Service: Cardiovascular;  Laterality: N/A;  . CARDIOVERSION N/A 11/04/2018   Procedure: CARDIOVERSION;  Surgeon: [name], MD;  Location: [facility];  Service: Cardiovascular;  Laterality: N/A;  . CIRCUMCISION    . JOINT REPLACEMENT    . PERCUTANEOUS PINNING TOE FRACTURE Left    "big toe  .  REPLACEMENT TOTAL KNEE Left   . TONSILLECTOMY AND ADENOIDECTOMY      Current Medications: Current Meds  Medication Sig  . acetaminophen (TYLENOL) 500 MG tablet Take 500-1,000 mg by mouth daily as needed for moderate pain.   Marland Kitchen amiodarone (PACERONE) 200 MG tablet Take 1 tablet (200 mg total) by mouth 2 (two) times daily.  . ARTIFICIAL TEAR SOLUTION OP Place 1 drop into both eyes 2 (two) times daily.  . famotidine (PEPCID) 20 MG tablet One at bedtime  . finasteride (PROSCAR) 5 MG tablet Take 5 mg by mouth daily at 12 noon.   . fluticasone (FLONASE) 50 MCG/ACT nasal spray Place 2 sprays into both  nostrils 2 (two) times daily.  . furosemide (LASIX) 40 MG tablet Take 1 tablet (40 mg total) by mouth daily.  Marland Kitchen loratadine (CLARITIN) 10 MG tablet Take 10 mg by mouth daily at 12 noon.   Marland Kitchen losartan (COZAAR) 50 MG tablet Take 1 tablet (50 mg total) by mouth daily. To replace Entresto.  . metoprolol tartrate (LOPRESSOR) 100 MG tablet TAKE 1/2 TABLET BY MOUTH TWICE DAILY  . Multiple Vitamin (MULTIVITAMIN WITH MINERALS) TABS tablet Take 1 tablet by mouth daily at 12 noon.  Marland Kitchen PHENobarbital (LUMINAL) 97.2 MG tablet Take 97.2 mg by mouth at bedtime.   . potassium chloride SA (KLOR-CON) 20 MEQ tablet Take 10 mEq by mouth daily at 12 noon.  . pravastatin (PRAVACHOL) 40 MG tablet Take 40 mg by mouth at bedtime.   Marland Kitchen warfarin (COUMADIN) 3 MG tablet Take 3 mg by mouth daily.      Allergies:   Dilaudid [hydromorphone hcl] and Tegretol [carbamazepine]   Social History   Socioeconomic History  . Marital status: Married    Spouse name: Not on file  . Number of children: Not on file  . Years of education: Not on file  . Highest education level: Not on file  Occupational History  . Not on file  Tobacco Use  . Smoking status: Former Smoker    Packs/day: 3.00    Years: 48.00    Pack years: 144.00    Types: Cigarettes    Quit date: 2000    Years since quitting: 21.1  . Smokeless tobacco: Never Used  Substance and Sexual Activity  . Alcohol use: No  . Drug use: No  . Sexual activity: Never  Other Topics Concern  . Not on file  Social History Narrative  . Not on file   Social Determinants of Health   Financial Resource Strain: Medium Risk  . Difficulty of Paying Living Expenses: Somewhat hard  Food Insecurity: Food Insecurity Present  . Worried About Charity fundraiser in the Last Year: Sometimes true  . Ran Out of Food in the Last Year: Sometimes true  Transportation Needs: No Transportation Needs  . Lack of Transportation (Medical): No  . Lack of Transportation (Non-Medical): No   Physical Activity: Inactive  . Days of Exercise per Week: 0 days  . Minutes of Exercise per Session: 0 min  Stress: Stress Concern Present  . Feeling of Stress : To some extent  Social Connections:   . Frequency of Communication with Friends and Family: Not on file  . Frequency of Social Gatherings with Friends and Family: Not on file  . Attends Religious Services: Not on file  . Active Member of Clubs or Organizations: Not on file  . Attends Archivist Meetings: Not on file  . Marital Status: Not on file  Family History: The patient's family history includes Hypertension in his mother; Other in his father.  ROS:   Please see the history of present illness.     All other systems reviewed and are negative.  EKGs/Labs/Other Studies Reviewed:    The following studies were reviewed today:  Echo November 2020-EF 40 to 45%  EKG: Prior EKG A. fib right bundle branch block personally reviewed on 03/11/2019  Recent Labs: 08/20/2018: NT-Pro BNP 433 01/15/2019: ALT 27; TSH 2.613 03/11/2019: BUN 17; Creatinine, Ser 1.03; Hemoglobin 15.2; Platelets 183; Potassium 3.5; Sodium 142  Recent Lipid Panel    Component Value Date/Time   CHOL 130 11/02/2017 0425   TRIG 81 11/02/2017 0425   HDL 31 (L) 11/02/2017 0425   CHOLHDL 4.2 11/02/2017 0425   VLDL 16 11/02/2017 0425   LDLCALC 83 11/02/2017 0425    Physical Exam:    VS:  BP 120/70   Pulse 62   Ht _0  (1.727 m)   Wt 276 lb (125.2 kg)   SpO2 96%   BMI 41.97 kg/m     Wt Readings from Last 3 Encounters:  04/08/19 276 lb (125.2 kg)  03/11/19 255 lb (115.7 kg)  01/15/19 271 lb (122.9 kg)     GEN:  Well nourished, well developed in no acute distress HEENT: Normal NECK: No JVD; No carotid bruits LYMPHATICS: No lymphadenopathy CARDIAC: IRRR, no murmurs, rubs, gallops RESPIRATORY:  Clear to auscultation without rales, wheezing or rhonchi  ABDOMEN: Soft, non-tender, non-distended, obese MUSCULOSKELETAL:  No edema; No  deformity  SKIN: Warm and dry NEUROLOGIC:  Alert and oriented x 3 PSYCHIATRIC:  Normal affect   ASSESSMENT:    1. Persistent atrial fibrillation ([place])   2. SOB (shortness of breath)   3. Chronic combined systolic and diastolic CHF (congestive heart failure) ([place])   4. High risk medication use   5. Morbid obesity ([place])    PLAN:    In order of problems listed above:  Persistent atrial fibrillation -CHA2DS2-VASc 4.  Symptomatic.  Try to repeat cardioversion again.  He has been offered the option of ablation by Dr. Curt Bears if this does not work.  Cardioversion in the next few days. -On amiodarone 200 mg twice daily.  Continuing with goal-directed monitoring of this high risk medication Prior chest x-ray 2019 viewed and interpreted showed no active lung disease, cardiomegaly noted.  TSH 2.6, normal.  ALT 27 normal.  Creatinine 1.03.  Office note from Dr. Curt Bears, EP reviewed and summarized above. -- 4th cardioversion.  -- INR 3.5 last check. 04/07/19. Yesterday. Last week in 4 range.   Obstructive sleep apnea -Has been referred back to sleep apnea physician.   Chronic systolic and diastolic heart failure -Seems to doing quite well from a volume standpoint.  EF 40 to 45%.  Metoprolol, losartan.  Chronic anticoagulation/secondary hypercoagulable state -On Coumadin, no bleeding  RBBB -Chronic.  No syncope.  Watch with amiodarone.  Morbid obesity -Continue to encourage weight loss, decrease carbohydrates and caloric count.  Exercise.  He has follow-up with Dr. Curt Bears in March.  Medication Adjustments/Labs and Tests Ordered: Current medicines are reviewed at length with the patient today.  Concerns regarding medicines are outlined above.  No orders of the defined types were placed in this encounter.  No orders of the defined types were placed in this encounter.   Patient Instructions  Medication Instructions:  The current medical regimen is effective;  continue present plan and  medications.  *If you need a refill on  your cardiac medications before your next appointment, please call your pharmacy*  Follow-Up: At [facility], you and your health needs are our priority.  As part of our continuing mission to provide you with exceptional heart care, we have created designated Provider Care Teams.  These Care Teams include your primary Cardiologist (physician) and Advanced Practice Providers (APPs -  Physician Assistants and Nurse Practitioners) who all work together to provide you with the care you need, when you need it.  Your next appointment:   6 month(s)  The format for your next appointment:   In Person  Provider:   Cecilie Kicks, NP and 1 year with Dr [name].  Thank you for choosing [facility]!!         Signed, [name], MD  04/08/2019 11:43 AM    [place]

## 2019-04-11 ENCOUNTER — Ambulatory Visit (HOSPITAL_COMMUNITY): Payer: Medicare HMO | Admitting: Anesthesiology

## 2019-04-11 ENCOUNTER — Encounter (HOSPITAL_COMMUNITY): Payer: Self-pay | Admitting: Cardiology

## 2019-04-11 ENCOUNTER — Other Ambulatory Visit: Payer: Self-pay

## 2019-04-11 ENCOUNTER — Encounter (HOSPITAL_COMMUNITY)
Admission: RE | Disposition: A | Discharge status: Home / Self Care | Payer: Medicare HMO | Source: Home / Self Care | Attending: Cardiology

## 2019-04-11 ENCOUNTER — Ambulatory Visit (HOSPITAL_COMMUNITY)
Admission: RE | Admit: 2019-04-11 | Discharge: 2019-04-11 | Disposition: A | Discharge status: Home / Self Care | Payer: Medicare HMO | Attending: Cardiology | Admitting: Cardiology

## 2019-04-11 DIAGNOSIS — Z6841 Body Mass Index (BMI) 40.0 and over, adult: Secondary | ICD-10-CM | POA: Insufficient documentation

## 2019-04-11 DIAGNOSIS — J449 Chronic obstructive pulmonary disease, unspecified: Secondary | ICD-10-CM | POA: Insufficient documentation

## 2019-04-11 DIAGNOSIS — E785 Hyperlipidemia, unspecified: Secondary | ICD-10-CM | POA: Diagnosis not present

## 2019-04-11 DIAGNOSIS — Z885 Allergy status to narcotic agent status: Secondary | ICD-10-CM | POA: Diagnosis not present

## 2019-04-11 DIAGNOSIS — Z96652 Presence of left artificial knee joint: Secondary | ICD-10-CM | POA: Insufficient documentation

## 2019-04-11 DIAGNOSIS — R569 Unspecified convulsions: Secondary | ICD-10-CM | POA: Diagnosis not present

## 2019-04-11 DIAGNOSIS — G4733 Obstructive sleep apnea (adult) (pediatric): Secondary | ICD-10-CM | POA: Diagnosis not present

## 2019-04-11 DIAGNOSIS — I4819 Other persistent atrial fibrillation: Secondary | ICD-10-CM | POA: Insufficient documentation

## 2019-04-11 DIAGNOSIS — I429 Cardiomyopathy, unspecified: Secondary | ICD-10-CM | POA: Insufficient documentation

## 2019-04-11 DIAGNOSIS — I252 Old myocardial infarction: Secondary | ICD-10-CM | POA: Insufficient documentation

## 2019-04-11 DIAGNOSIS — I110 Hypertensive heart disease with heart failure: Secondary | ICD-10-CM | POA: Diagnosis not present

## 2019-04-11 DIAGNOSIS — H903 Sensorineural hearing loss, bilateral: Secondary | ICD-10-CM | POA: Diagnosis not present

## 2019-04-11 DIAGNOSIS — I5042 Chronic combined systolic (congestive) and diastolic (congestive) heart failure: Secondary | ICD-10-CM | POA: Insufficient documentation

## 2019-04-11 DIAGNOSIS — K219 Gastro-esophageal reflux disease without esophagitis: Secondary | ICD-10-CM | POA: Diagnosis not present

## 2019-04-11 DIAGNOSIS — Z8249 Family history of ischemic heart disease and other diseases of the circulatory system: Secondary | ICD-10-CM | POA: Insufficient documentation

## 2019-04-11 DIAGNOSIS — Z87891 Personal history of nicotine dependence: Secondary | ICD-10-CM | POA: Diagnosis not present

## 2019-04-11 DIAGNOSIS — I4510 Unspecified right bundle-branch block: Secondary | ICD-10-CM | POA: Diagnosis not present

## 2019-04-11 DIAGNOSIS — Z7901 Long term (current) use of anticoagulants: Secondary | ICD-10-CM | POA: Diagnosis not present

## 2019-04-11 DIAGNOSIS — Z79899 Other long term (current) drug therapy: Secondary | ICD-10-CM | POA: Diagnosis not present

## 2019-04-11 HISTORY — PX: CARDIOVERSION: SHX1299

## 2019-04-11 LAB — POCT I-STAT, CHEM 8
BUN: 18 mg/dL (ref 8–23)
Calcium, Ion: 1.09 mmol/L — ABNORMAL LOW (ref 1.15–1.40)
Chloride: 102 mmol/L (ref 98–111)
Creatinine, Ser: 1 mg/dL (ref 0.61–1.24)
Glucose, Bld: 101 mg/dL — ABNORMAL HIGH (ref 70–99)
HCT: 51 % (ref 39.0–52.0)
Hemoglobin: 17.3 g/dL — ABNORMAL HIGH (ref 13.0–17.0)
Potassium: 3.7 mmol/L (ref 3.5–5.1)
Sodium: 141 mmol/L (ref 135–145)
TCO2: 31 mmol/L (ref 22–32)

## 2019-04-11 LAB — PROTIME-INR
INR: 3.2 — ABNORMAL HIGH (ref 0.8–1.2)
Prothrombin Time: 33 seconds — ABNORMAL HIGH (ref 11.4–15.2)

## 2019-04-11 SURGERY — CARDIOVERSION
Anesthesia: General

## 2019-04-11 MED ORDER — PROPOFOL 10 MG/ML IV BOLUS
INTRAVENOUS | Status: DC | PRN
  Administered 2019-04-11: 30 mg via INTRAVENOUS

## 2019-04-11 MED ORDER — LIDOCAINE 2% (20 MG/ML) 5 ML SYRINGE
INTRAMUSCULAR | Status: DC | PRN
  Administered 2019-04-11: 60 mg via INTRAVENOUS

## 2019-04-11 MED ORDER — SODIUM CHLORIDE 0.9 % IV SOLN: INTRAVENOUS | Status: DC | PRN

## 2019-04-11 NOTE — Discharge Instructions (Signed)
Electrical Cardioversion   What can I expect after the procedure?  Your blood pressure, heart rate, breathing rate, and blood oxygen level will be monitored until you leave the hospital or clinic.  Your heart rhythm will be watched to make sure it does not change.  You may have some redness on the skin where the shocks were given. Follow these instructions at home:  Do not drive for 24 hours if you were given a sedative during your procedure.  Take over-the-counter and prescription medicines only as told by your health care provider.  Ask your health care provider how to check your pulse. Check it often.  Rest for 48 hours after the procedure or as told by your health care provider.  Avoid or limit your caffeine use as told by your health care provider.  Keep all follow-up visits as told by your health care provider. This is important. Contact a health care provider if:  You feel like your heart is beating too quickly or your pulse is not regular.  You have a serious muscle cramp that does not go away. Get help right away if:  You have discomfort in your chest.  You are dizzy or you feel faint.  You have trouble breathing or you are short of breath.  Your speech is slurred.  You have trouble moving an arm or leg on one side of your body.  Your fingers or toes turn cold or blue. Summary  Electrical cardioversion is the delivery of a jolt of electricity to restore a normal rhythm to the heart.  This procedure may be done right away in an emergency or may be a scheduled procedure if the condition is not an emergency.  Generally, this is a safe procedure.  After the procedure, check your pulse often as told by your health care provider. This information is not intended to replace advice given to you by your health care provider. Make sure you discuss any questions you have with your health care provider. Document Revised: 09/23/2018 Document Reviewed:  09/23/2018 Elsevier Patient Education  [place].

## 2019-04-11 NOTE — Procedures (Signed)
Electrical Cardioversion Procedure Note Ruben Murray [id number] May 21, 1945  Procedure: Electrical Cardioversion Indications:  Atrial Fibrillation  Procedure Details Consent: Risks of procedure as well as the alternatives and risks of each were explained to the (patient/caregiver).  Consent for procedure obtained. Time Out: Verified patient identification, verified procedure, site/side was marked, verified correct patient position, special equipment/implants available, medications/allergies/relevent history reviewed, required imaging and test results available.  Performed  Patient placed on cardiac monitor, pulse oximetry, supplemental oxygen as necessary.  Sedation given: Pt sedated by anesthesia with lidocaine 60 mg and diprovan 40 mg IV Pacer pads placed anterior and posterior chest.  Cardioverted 1 time(s).  Cardioverted at 150J.  Evaluation Findings: Post procedure EKG shows: NSR Complications: None Patient did tolerate procedure well.   Kirk Ruths 04/11/2019, 7:39 AM

## 2019-04-11 NOTE — Anesthesia Postprocedure Evaluation (Signed)
Anesthesia Post Note  Patient: Ruben Murray  Procedure(s) Performed: CARDIOVERSION (N/A )     Patient location during evaluation: Endoscopy Anesthesia Type: General Level of consciousness: sedated Pain management: pain level controlled Vital Signs Assessment: post-procedure vital signs reviewed and stable Respiratory status: spontaneous breathing Cardiovascular status: stable Postop Assessment: no apparent nausea or vomiting Anesthetic complications: no    Last Vitals:  Vitals:   04/11/19 0840 04/11/19 0850  BP: 131/81   Pulse: 62 (!) 58  Resp: 12 12  Temp:    SpO2: 94% 96%    Last Pain:  Vitals:   04/11/19 0850  TempSrc:   PainSc: 0-No pain   Pain Goal:                   Huston Foley

## 2019-04-11 NOTE — Transfer of Care (Signed)
Immediate Anesthesia Transfer of Care Note  Patient: Ruben Murray  Procedure(s) Performed: CARDIOVERSION (N/A )  Patient Location: Endoscopy Unit  Anesthesia Type:General  Level of Consciousness: awake, alert  and oriented  Airway & Oxygen Therapy: Patient Spontanous Breathing  Post-op Assessment: Report given to RN and Post -op Vital signs reviewed and stable  Post vital signs: Reviewed and stable  Last Vitals:  Vitals Value Taken Time  BP 130/79 04/11/19 0834  Temp    Pulse    Resp 16 04/11/19 0828  SpO2 98 % 04/11/19 0834    Last Pain:  Vitals:   04/11/19 0736  TempSrc: Oral  PainSc: 0-No pain         Complications: No apparent anesthesia complications

## 2019-04-11 NOTE — Anesthesia Preprocedure Evaluation (Addendum)
Anesthesia Evaluation  Patient identified by MRN, date of birth, ID band Patient awake    Reviewed: Allergy & Precautions, NPO status , Patient's Chart, lab work & pertinent test results, reviewed documented beta blocker date and time   Airway Mallampati: III  TM Distance: >3 FB Neck ROM: Full    Dental no notable dental hx. (+) Edentulous Upper, Upper Dentures, Partial Lower   Pulmonary asthma , sleep apnea , COPD, former smoker,    Pulmonary exam normal        Cardiovascular hypertension, Pt. on medications and Pt. on home beta blockers + Past MI and +CHF  + dysrhythmias Atrial Fibrillation  Rhythm:Irregular Rate:Normal  EKG RBBB  TTE 03/2018 EF 40-45%, G1DD, no significant valvular abnormalities   Neuro/Psych Seizures -,  negative psych ROS   GI/Hepatic negative GI ROS, Neg liver ROS,   Endo/Other  Morbid obesity  Renal/GU negative Renal ROS  negative genitourinary   Musculoskeletal negative musculoskeletal ROS (+)   Abdominal (+) + obese,   Peds  Hematology   Anesthesia Other Findings   Reproductive/Obstetrics                             Anesthesia Physical  Anesthesia Plan  ASA: III  Anesthesia Plan: General   Post-op Pain Management:    Induction: Intravenous  PONV Risk Score and Plan: 2 and Propofol infusion, Treatment may vary due to age or medical condition and TIVA  Airway Management Planned: Mask and Natural Airway  Additional Equipment: None  Intra-op Plan:   Post-operative Plan:   Informed Consent: I have reviewed the patients History and Physical, chart, labs and discussed the procedure including the risks, benefits and alternatives for the proposed anesthesia with the patient or authorized representative who has indicated his/her understanding and acceptance.       Plan Discussed with: CRNA  Anesthesia Plan Comments:        Anesthesia Quick  Evaluation

## 2019-04-11 NOTE — Addendum Note (Signed)
Addendum  created 04/11/19 0900 by Lyn Hollingshead, MD   Clinical Note Signed

## 2019-04-11 NOTE — H&P (Signed)
Office Visit     04/08/2019  [facility] [address] Office            [place], Thana Farr, MD   Cardiology        Persistent atrial fibrillation [facility]) +4 more   Dx        Referred by [name], MD   Reason for Visit        Additional Documentation   Vitals:       BP 120/70       Pulse 62       Ht _0  (1.727 m)       Wt 276 lb (125.2 kg)       SpO2 96%       BMI 41.97 kg/m       BSA 2.45 m             More Vitals    Flowsheets:        Anthropometrics,       NEWS,       MEWS Score,       Method of Visit     Encounter Info:        Billing Info,       History,       Allergies,       Detailed Report            All Notes      Progress Notes by [name], MD at 04/08/2019 11:00 AM   Author: [name], MD Author Type: Physician Filed: 04/08/2019 11:43 AM  Note Status: Signed Cosign: Cosign Not Required Encounter Date: 04/08/2019  Editor: [name], MD (Physician)      Expand AllCollapse All           untitled image   Cardiology Office Note:        Date:  04/08/2019      ID:  Ruben Murray, DOB 13-Feb-1946, MRN [id number]     PCP:  [name], MD        Cardiologist:  [name], MD   Electrophysiologist:  None      Referring MD: [name], MD             History of Present Illness:        Ruben Murray is a 74 y.o. male with persistent atrial fibrillation here for follow-up prior to cardioversion.  Had repeat cardioversion 11/04/2018 after being loaded on amiodarone.  On warfarin for anticoagulation.  No bleeding.  Still feeling short of breath weak fatigue.  After cardioversions briefly he felt well.  Trying to restore sinus rhythm.  He is also seen Dr. Curt Bears with electrophysiology.  Seen A. fib clinic as well.  Has cardioversion this Friday.  Getting Covid test.  Lives in a 1  bedroom apartment and is short of breath with minimal activity.  Quit smoking 18 years ago.          Past Medical History:    Diagnosis   Date    .   Acquired thrombophilia ([place])   01/15/2019    .   Aortic atherosclerosis ([place])        .   Atrial fibrillation with RVR ([place])   10/29/2017    .   CAP (community acquired pneumonia)   03/06/2016    .   Cardiomyopathy ([place])            a. EF 40-45% in 04/2016, etiology not defined, managed  medically.    .   CHF exacerbation ([place])   10/29/2017    .   Chronic laryngitis   10/03/2016    .   COPD GOLD II    08/02/2016        Quit smoking 2000  PFT's  07/10/2016  FEV1 1.98 (70 % ) ratio 67  p 19 % improvement from saba p nothing  prior to study while of coreg so rec as of 08/02/2016 try off coreg and on bisoprolol       .   Dysphonia   10/03/2016    .   Essential hypertension   03/06/2016        Changed from coreg to bisoprolol due to copd with reversible component  08/02/2016 >>>     .   Fever   03/12/2015    .   Hoarseness   08/31/2016        Referred to ent 08/31/2016 >>> seen 10/03/16 [name] dx gerd and rhinitis medicamentosa   > improved on f/u 01/31/17 on gerd rx/ flonase and off afrin    .   Hyperlipidemia        .   Hypertension        .   Laryngopharyngeal reflux (LPR)   10/03/2016    .   Moderate persistent asthma   08/02/2016    .   Morbid (severe) obesity due to excess calories ([place])   08/02/2016    .   Myocardial infarction [facility])   1980-early 2000s X 2        "mild ones" (03/06/2016)    .   Nasal polyps   10/03/2016    .   OSA on CPAP            a. pt states was told by Dr. Maxwell Caul that he would not require CPAP as long as he continued to sleep in recliner which he does for his back.    .   Paroxysmal atrial fibrillation ([place])        .   Pneumonia            "when I was a kid" (03/06/2016)     .   Prolonged QT interval   10/29/2017    .   RBBB        .   Recurrent erosion of cornea   06/23/2011    .   Rhinitis medicamentosa   10/03/2016    .   Right thyroid nodule   03/06/2016    .   Seizures ([place])            "take RX daily" (03/06/2016)    .   Sensorineural hearing loss (SNHL) of both ears   01/31/2017                Past Surgical History:    Procedure   Laterality   Date    .   CARDIOVERSION   N/A   12/13/2017        Procedure: CARDIOVERSION;  Surgeon: [name], MD;  Location: [facility] ENDOSCOPY;  Service: Cardiovascular;  Laterality: N/A;    .   CARDIOVERSION   N/A   09/25/2018        Procedure: CARDIOVERSION;  Surgeon: [name], MD;  Location: [facility] ENDOSCOPY;  Service: Cardiovascular;  Laterality: N/A;    .   CARDIOVERSION   N/A   11/04/2018        Procedure: CARDIOVERSION;  Surgeon: Mertie Moores  J, MD;  Location: [facility] ENDOSCOPY;  Service: Cardiovascular;  Laterality: N/A;    .   CIRCUMCISION            .   JOINT REPLACEMENT            .   PERCUTANEOUS PINNING TOE FRACTURE   Left            "big toe    .   REPLACEMENT TOTAL KNEE   Left        .   TONSILLECTOMY AND ADENOIDECTOMY                  Current Medications:  Active Medications                                                                                                                                                                                  Allergies:   Dilaudid [hydromorphone hcl] and Tegretol [carbamazepine]       Social History             Socioeconomic History    .   Marital status:   Married            Spouse name:   Not on file    .   Number of children:   Not on file    .   Years of education:    Not on file    .   Highest education level:   Not on file    Occupational History    .   Not on file    Tobacco Use    .   Smoking status:   Former Smoker            Packs/day:   3.00            Years:   48.00            Pack years:   144.00            Types:   Cigarettes            Quit date:   2000            Years since quitting:   21.1    .   Smokeless tobacco:   Never Used    Substance and Sexual Activity    .   Alcohol use:   No    .   Drug use:   No    .   Sexual activity:   Never    Other Topics   Concern    .   Not on file    Social History Narrative    .  Not on file        Social Determinants of Health           Financial Resource Strain: Medium Risk    .   Difficulty of Paying Living Expenses: Somewhat hard    Food Insecurity: Food Insecurity Present    .   Worried About Charity fundraiser in the Last Year: Sometimes true    .   Ran Out of Food in the Last Year: Sometimes true    Transportation Needs: No Transportation Needs    .   Lack of Transportation (Medical): No    .   Lack of Transportation (Non-Medical): No    Physical Activity: Inactive    .   Days of Exercise per Week: 0 days    .   Minutes of Exercise per Session: 0 min    Stress: Stress Concern Present    .   Feeling of Stress : To some extent    Social Connections:     .   Frequency of Communication with Friends and Family: Not on file    .   Frequency of Social Gatherings with Friends and Family: Not on file    .   Attends Religious Services: Not on file    .   Active Member of Clubs or Organizations: Not on file    .   Attends Archivist Meetings: Not on file    .   Marital Status: Not on file          Family History:  The patient's family history includes Hypertension in his mother; Other in  his father.     ROS:    Please see the history of present illness.      All other systems reviewed and are negative.      EKGs/Labs/Other Studies Reviewed:        The following studies were reviewed today:     Echo November 2020-EF 40 to 45%     EKG: Prior EKG A. fib right bundle branch block personally reviewed on 03/11/2019     Recent Labs:  08/20/2018: NT-Pro BNP 433  01/15/2019: ALT 27; TSH 2.613  03/11/2019: BUN 17; Creatinine, Ser 1.03; Hemoglobin 15.2; Platelets 183; Potassium 3.5; Sodium 142   Recent Lipid Panel  Labs (Brief)                                                                                                               Physical Exam:        VS:  BP 120/70   Pulse 62   Ht _0  (1.727 m)   Wt 276 lb (125.2 kg)   SpO2 96%   BMI 41.97 kg/m            Wt Readings from Last 3 Encounters:    04/08/19   276 lb (125.2 kg)    03/11/19   255 lb (115.7 kg)    01/15/19   271 lb (122.9 kg)  GEN:  Well nourished, well developed in no acute distress  HEENT: Normal  NECK: No JVD; No carotid bruits  LYMPHATICS: No lymphadenopathy  CARDIAC: IRRR, no murmurs, rubs, gallops  RESPIRATORY:  Clear to auscultation without rales, wheezing or rhonchi   ABDOMEN: Soft, non-tender, non-distended, obese  MUSCULOSKELETAL:  No edema; No deformity   SKIN: Warm and dry  NEUROLOGIC:  Alert and oriented x 3  PSYCHIATRIC:  Normal affect       ASSESSMENT:         1.   Persistent atrial fibrillation ([place])     2.   SOB (shortness of breath)     3.   Chronic combined systolic and diastolic CHF (congestive heart failure) ([place])     4.   High risk medication use     5.   Morbid obesity ([place])         PLAN:        In order of problems listed above:     Persistent atrial fibrillation  -CHA2DS2-VASc 4.  Symptomatic.   Try to repeat cardioversion again.  He has been offered the option of ablation by Dr. Curt Bears if this does not work.  Cardioversion in the next few days.  -On amiodarone 200 mg twice daily.  Continuing with goal-directed monitoring of this high risk medication  Prior chest x-ray 2019 viewed and interpreted showed no active lung disease, cardiomegaly noted.  TSH 2.6, normal.  ALT 27 normal.  Creatinine 1.03.  Office note from Dr. Curt Bears, EP reviewed and summarized above.  -- 4th cardioversion.   -- INR 3.5 last check. 04/07/19. Yesterday. Last week in 4 range.      Obstructive sleep apnea  -Has been referred back to sleep apnea physician.      Chronic systolic and diastolic heart failure  -Seems to doing quite well from a volume standpoint.  EF 40 to 45%.  Metoprolol, losartan.     Chronic anticoagulation/secondary hypercoagulable state  -On Coumadin, no bleeding     RBBB  -Chronic.  No syncope.  Watch with amiodarone.     Morbid obesity  -Continue to encourage weight loss, decrease carbohydrates and caloric count.  Exercise.     He has follow-up with Dr. Curt Bears in March.     Medication Adjustments/Labs and Tests Ordered:  Current medicines are reviewed at length with the patient today.  Concerns regarding medicines are outlined above.   No orders of the defined types were placed in this encounter.     No orders of the defined types were placed in this encounter.         Patient Instructions    Medication Instructions:   The current medical regimen is effective;  continue present plan and medications.     *If you need a refill on your cardiac medications before your next appointment, please call your pharmacy*     Follow-Up:  At [facility], you and your health needs are our priority.  As part of our continuing mission to provide you with exceptional heart care, we have created designated Provider Care Teams.  These Care Teams include your  primary Cardiologist (physician) and Advanced Practice Providers (APPs -  Physician Assistants and Nurse Practitioners) who all work together to provide you with the care you need, when you need it.     Your next appointment:    6 month(s)     The format for your next appointment:    In Person     Provider:  Cecilie Kicks, NP and 1 year with Dr [name].     Thank you for choosing [facility]!!     untitled image                 Signed,  [name], MD   04/08/2019 11:43 AM     [place]; INR has been therapeutic for >21 days; no changes Kirk Ruths

## 2019-04-11 NOTE — Interval H&P Note (Signed)
History and Physical Interval Note:  04/11/2019 7:41 AM  Ruben Murray  has presented today for surgery, with the diagnosis of A-FIB.  The various methods of treatment have been discussed with the patient and family. After consideration of risks, benefits and other options for treatment, the patient has consented to  Procedure(s): CARDIOVERSION (N/A) as a surgical intervention.  The patient's history has been reviewed, patient examined, no change in status, stable for surgery.  I have reviewed the patient's chart and labs.  Questions were answered to the patient's satisfaction.     Kirk Ruths

## 2019-04-14 NOTE — Progress Notes (Signed)
Post-op phone call completed on 2/8 for cardioversion on 2/5.  Pt stated he feels very weak like he did before the cardioversion.  I asked the pt if he has been able to monitor his HR/BP since his procedure and he stated he has not had time.  Pt education on monitoring vital signs since cardioversion.  Pt has follow up appt with Dr. Curt Bears in March.  Advised pt to contact cardiology office if weakness doesn't subside or if he feels like he is back in a.fib.  Pt verbalized understanding.

## 2019-04-15 ENCOUNTER — Other Ambulatory Visit: Payer: Self-pay

## 2019-04-15 NOTE — Patient Outreach (Signed)
[facility]) Care Management  [place]  04/15/2019   Ruben Murray March 08, 1945 [id number]  Subjective: Telephone call to patient. He reports that he is doing good. He reports that his wife is in the hospital with dehydration and he is getting ready to go out to see her.  He reports that his weight is good at 268 lbs.  Recent cardioversion and it went well.  Patient hoping his heart will maintain rhythm.  Reviewed heart failure. Patient denies any questions or concerns.   Objective:   Encounter Medications:  Outpatient Encounter Medications as of 04/15/2019  Medication Sig  . acetaminophen (TYLENOL) 500 MG tablet Take 500-1,000 mg by mouth daily as needed for moderate pain.   Marland Kitchen amiodarone (PACERONE) 200 MG tablet Take 1 tablet (200 mg total) by mouth 2 (two) times daily.  . ARTIFICIAL TEAR SOLUTION OP Place 1 drop into both eyes 2 (two) times daily.  . famotidine (PEPCID) 20 MG tablet One at bedtime  . finasteride (PROSCAR) 5 MG tablet Take 5 mg by mouth daily at 12 noon.   . fluticasone (FLONASE) 50 MCG/ACT nasal spray Place 2 sprays into both nostrils 2 (two) times daily.  . furosemide (LASIX) 40 MG tablet Take 1 tablet (40 mg total) by mouth daily.  Marland Kitchen loratadine (CLARITIN) 10 MG tablet Take 10 mg by mouth daily at 12 noon.   Marland Kitchen losartan (COZAAR) 50 MG tablet Take 1 tablet (50 mg total) by mouth daily. To replace Entresto.  . metoprolol tartrate (LOPRESSOR) 100 MG tablet TAKE 1/2 TABLET BY MOUTH TWICE DAILY  . Multiple Vitamin (MULTIVITAMIN WITH MINERALS) TABS tablet Take 1 tablet by mouth daily at 12 noon.  Marland Kitchen PHENobarbital (LUMINAL) 97.2 MG tablet Take 97.2 mg by mouth at bedtime.   . potassium chloride SA (KLOR-CON) 20 MEQ tablet Take 10 mEq by mouth daily at 12 noon.  . pravastatin (PRAVACHOL) 40 MG tablet Take 40 mg by mouth at bedtime.   Marland Kitchen warfarin (COUMADIN) 3 MG tablet Take 3 mg by mouth daily.    No facility-administered encounter medications on file as  of 04/15/2019.    Functional Status:  In your present state of health, do you have any difficulty performing the following activities: 06/07/2018  Hearing? Y  Comment trouble hearing  Vision? N  Difficulty concentrating or making decisions? N  Walking or climbing stairs? Y  Comment difficulties climbing stairs due to knees  Dressing or bathing? N  Doing errands, shopping? N  Preparing Food and eating ? N  Using the Toilet? N  In the past six months, have you accidently leaked urine? N  Do you have problems with loss of bowel control? N  Managing your Medications? Y  Comment wife assist with management of medications  Managing your Finances? N  Housekeeping or managing your Housekeeping? N  Some recent data might be hidden    Fall/Depression Screening: Fall Risk  04/15/2019 11/15/2018 09/26/2018  Falls in the past year? 0 0 0  Number falls in past yr: - - -  Injury with Fall? - - -  Risk for fall due to : - History of fall(s);Medication side effect;Impaired balance/gait;Impaired mobility;Impaired vision Medication side effect;Impaired balance/gait;Impaired mobility  Follow up - Falls evaluation completed;Education provided;Falls prevention discussed Falls evaluation completed;Education provided;Falls prevention discussed   PHQ 2/9 Scores 06/07/2018 02/18/2018 11/15/2017  PHQ - 2 Score 0 1 0    Assessment: Patient continues to manage chronic illnesses.   Plan:  [facility] CM  Care Plan Problem One     Most Recent Value  Care Plan Problem One  Continued learning related to diagnosis of heart failure and management of atrial fibrillation  Role Documenting the Problem One  Care Management Telephonic Coordinator  Care Plan for Problem One  Active  [facility] Long Term Goal   Patient will report no A. Fibrillation and will continue to watch for signs of Heart Failure within 90 days.  [facility] Long Term Goal Start Date  04/15/19  Interventions for Problem One Long Term Goal  Patient recently cardioverted.   Patient weight remains stable.  Discussed signs of heart failure.       RN CM will contact again in April and patient agreeable.  [name], RN, MSN [place] Management Care Management Coordinator Direct Line [phone] Cell [phone] Toll Free: [phone]  Fax: [phone]

## 2019-04-17 ENCOUNTER — Ambulatory Visit: Payer: Self-pay

## 2019-04-29 DIAGNOSIS — Z7901 Long term (current) use of anticoagulants: Secondary | ICD-10-CM | POA: Diagnosis not present

## 2019-04-29 DIAGNOSIS — G40909 Epilepsy, unspecified, not intractable, without status epilepticus: Secondary | ICD-10-CM | POA: Diagnosis not present

## 2019-04-29 DIAGNOSIS — I4811 Longstanding persistent atrial fibrillation: Secondary | ICD-10-CM | POA: Diagnosis not present

## 2019-04-29 DIAGNOSIS — I5042 Chronic combined systolic (congestive) and diastolic (congestive) heart failure: Secondary | ICD-10-CM | POA: Diagnosis not present

## 2019-04-29 DIAGNOSIS — R7303 Prediabetes: Secondary | ICD-10-CM | POA: Diagnosis not present

## 2019-04-29 DIAGNOSIS — B356 Tinea cruris: Secondary | ICD-10-CM | POA: Diagnosis not present

## 2019-04-29 DIAGNOSIS — I10 Essential (primary) hypertension: Secondary | ICD-10-CM | POA: Diagnosis not present

## 2019-04-29 DIAGNOSIS — Z79899 Other long term (current) drug therapy: Secondary | ICD-10-CM | POA: Diagnosis not present

## 2019-05-13 ENCOUNTER — Other Ambulatory Visit: Payer: Self-pay

## 2019-05-13 ENCOUNTER — Encounter: Payer: Self-pay | Admitting: Cardiology

## 2019-05-13 ENCOUNTER — Ambulatory Visit (INDEPENDENT_AMBULATORY_CARE_PROVIDER_SITE_OTHER): Payer: Medicare HMO | Admitting: Cardiology

## 2019-05-13 VITALS — BP 138/86 | HR 67 | Ht 68.0 in | Wt 273.2 lb

## 2019-05-13 DIAGNOSIS — Z6841 Body Mass Index (BMI) 40.0 and over, adult: Secondary | ICD-10-CM | POA: Diagnosis not present

## 2019-05-13 DIAGNOSIS — I5042 Chronic combined systolic (congestive) and diastolic (congestive) heart failure: Secondary | ICD-10-CM | POA: Diagnosis not present

## 2019-05-13 DIAGNOSIS — I4819 Other persistent atrial fibrillation: Secondary | ICD-10-CM

## 2019-05-13 MED ORDER — AMIODARONE HCL 200 MG PO TABS: 200.0000 mg | ORAL_TABLET | Freq: Every day | ORAL | 1 refills | Status: DC

## 2019-05-13 MED ORDER — METOPROLOL SUCCINATE ER 100 MG PO TB24: 100.0000 mg | ORAL_TABLET | Freq: Every day | ORAL | 1 refills | Status: DC

## 2019-05-13 NOTE — Addendum Note (Signed)
Addended by: Stanton Kidney on: 05/13/2019 04:39 PM   Modules accepted: Orders

## 2019-05-13 NOTE — Progress Notes (Signed)
Electrophysiology Office Note   Date:  05/13/2019   ID:  Ruben, Murray 12/18/1945, MRN [id number]  PCP:  [name], MD  Cardiologist:  [name] Primary Electrophysiologist:   Meredith Leeds, MD    Chief Complaint: AF   History of Present Illness: Ruben Murray is a 74 y.o. male who is being seen today for the evaluation of AF at the request of [name]. Presenting today for electrophysiology evaluation.  He has a history of obesity, hypertension, untreated sleep apnea, seizures.  January 2020 he was treated to the hospital for community-acquired pneumonia and was found to be in new onset atrial fibrillation.  The patient failed cardioversion 09/25/2018.  He had repeat cardioversion 11/04/2018 after being loaded on amiodarone.  He is currently on warfarin.  Today, denies symptoms of palpitations, chest pain,  orthopnea, PND, lower extremity edema, claudication, dizziness, presyncope, syncope, bleeding, or neurologic sequela. The patient is tolerating medications without difficulties.  Since his cardioversion, he has continued to have episodes of shortness of breath.  He does remain in sinus rhythm today.  He continues to sleep in a chair.  This is also unchanged from his cardioversion.  He has lost up to 20 pounds over the past few months.  He does say that he is feeling quite weak and fatigued.   Past Medical History:  Diagnosis Date  . Acquired thrombophilia ([place]) 01/15/2019  . Aortic atherosclerosis ([place])   . Atrial fibrillation with RVR ([place]) 10/29/2017  . CAP (community acquired pneumonia) 03/06/2016  . Cardiomyopathy ([place])    a. EF 40-45% in 04/2016, etiology not defined, managed medically.  . CHF exacerbation ([place]) 10/29/2017  . Chronic laryngitis 10/03/2016  . COPD GOLD II  08/02/2016   Quit smoking 2000  PFT's  07/10/2016  FEV1 1.98 (70 % ) ratio 67  p 19 % improvement from saba p nothing  prior to study while of coreg so rec as of 08/02/2016 try off coreg and on  bisoprolol     . Dysphonia 10/03/2016  . Essential hypertension 03/06/2016   Changed from coreg to bisoprolol due to copd with reversible component  08/02/2016 >>>   . Fever 03/12/2015  . Hoarseness 08/31/2016   Referred to ent 08/31/2016 >>> seen 10/03/16 [name] dx gerd and rhinitis medicamentosa   > improved on f/u 01/31/17 on gerd rx/ flonase and off afrin  . Hyperlipidemia   . Hypertension   . Laryngopharyngeal reflux (LPR) 10/03/2016  . Moderate persistent asthma 08/02/2016  . Morbid (severe) obesity due to excess calories ([place]) 08/02/2016  . Myocardial infarction [facility]) 1980-early 2000s X 2   "mild ones" (03/06/2016)  . Nasal polyps 10/03/2016  . OSA on CPAP    a. pt states was told by Dr. Maxwell Caul that he would not require CPAP as long as he continued to sleep in recliner which he does for his back.  . Paroxysmal atrial fibrillation ([place])   . Pneumonia    "when I was a kid" (03/06/2016)  . Prolonged QT interval 10/29/2017  . RBBB   . 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 Rhinitis medicamentosa 10/03/2016  . Right thyroid nodule 03/06/2016  . Seizures ([place])    "take RX daily" (03/06/2016)  . Sensorineural hearing loss (SNHL) of both ears 01/31/2017   Past Surgical History:  Procedure Laterality Date  . CARDIOVERSION N/A 12/13/2017   Procedure: CARDIOVERSION;  Surgeon: [name], MD;  Location: [facility];  Service: Cardiovascular;  Laterality: N/A;  . CARDIOVERSION N/A  09/25/2018   Procedure: CARDIOVERSION;  Surgeon: [name], MD;  Location: [facility] ENDOSCOPY;  Service: Cardiovascular;  Laterality: N/A;  . CARDIOVERSION N/A 11/04/2018   Procedure: CARDIOVERSION;  Surgeon: [name], MD;  Location: [facility] ENDOSCOPY;  Service: Cardiovascular;  Laterality: N/A;  . CARDIOVERSION N/A 04/11/2019   Procedure: CARDIOVERSION;  Surgeon: [name], MD;  Location: [facility] ENDOSCOPY;  Service: Cardiovascular;  Laterality: N/A;  . CIRCUMCISION    . JOINT REPLACEMENT    . PERCUTANEOUS PINNING  TOE FRACTURE Left    "big toe  . REPLACEMENT TOTAL KNEE Left   . TONSILLECTOMY AND ADENOIDECTOMY       Current Outpatient Medications  Medication Sig Dispense Refill  . acetaminophen (TYLENOL) 500 MG tablet Take 500-1,000 mg by mouth daily as needed for moderate pain.     Marland Kitchen amiodarone (PACERONE) 200 MG tablet Take 1 tablet (200 mg total) by mouth 2 (two) times daily. 180 tablet 0  . ARTIFICIAL TEAR SOLUTION OP Place 1 drop into both eyes 2 (two) times daily.    . famotidine (PEPCID) 20 MG tablet One at bedtime 30 tablet 2  . finasteride (PROSCAR) 5 MG tablet Take 5 mg by mouth daily at 12 noon.     . fluticasone (FLONASE) 50 MCG/ACT nasal spray Place 2 sprays into both nostrils 2 (two) times daily.    . furosemide (LASIX) 40 MG tablet Take 1 tablet (40 mg total) by mouth daily. 90 tablet 3  . loratadine (CLARITIN) 10 MG tablet Take 10 mg by mouth daily at 12 noon.     Marland Kitchen losartan (COZAAR) 50 MG tablet Take 1 tablet (50 mg total) by mouth daily. To replace Entresto. 30 tablet 6  . metoprolol tartrate (LOPRESSOR) 100 MG tablet TAKE 1/2 TABLET BY MOUTH TWICE DAILY 90 tablet 2  . Multiple Vitamin (MULTIVITAMIN WITH MINERALS) TABS tablet Take 1 tablet by mouth daily at 12 noon.    Marland Kitchen PHENobarbital (LUMINAL) 97.2 MG tablet Take 97.2 mg by mouth at bedtime.     . potassium chloride SA (KLOR-CON) 20 MEQ tablet Take 10 mEq by mouth daily at 12 noon.    . pravastatin (PRAVACHOL) 40 MG tablet Take 40 mg by mouth at bedtime.     Marland Kitchen warfarin (COUMADIN) 3 MG tablet Take 3 mg by mouth daily.      No current facility-administered medications for this visit.    Allergies:   Dilaudid [hydromorphone hcl] and Tegretol [carbamazepine]   Social History:  The patient  reports that he quit smoking about 21 years ago. His smoking use included cigarettes. He has a 144.00 pack-year smoking history. He has never used smokeless tobacco. He reports that he does not drink alcohol or use drugs.   Family History:  The  patient's family history includes Hypertension in his mother; Other in his father.    ROS:  Please see the history of present illness.   Otherwise, review of systems is positive for none.   All other systems are reviewed and negative.   PHYSICAL EXAM: VS:  BP 138/86   Pulse 67   Ht _0  (1.727 m)   Wt 273 lb 3.2 oz (123.9 kg)   SpO2 97%   BMI 41.54 kg/m  , BMI Body mass index is 41.54 kg/m. GEN: Well nourished, well developed, in no acute distress  HEENT: normal  Neck: no JVD, carotid bruits, or masses Cardiac: RRR; no murmurs, rubs, or gallops,no edema  Respiratory:  clear to auscultation bilaterally,  normal work of breathing GI: soft, nontender, nondistended, + BS MS: no deformity or atrophy  Skin: warm and dry Neuro:  Strength and sensation are intact Psych: euthymic mood, full affect  EKG:  EKG is ordered today. Personal review of the ekg ordered shows rhythm, right bundle branch block, rate sixty-seven  Recent Labs: 08/20/2018: NT-Pro BNP 433 01/15/2019: ALT 27; TSH 2.613 03/11/2019: Platelets 183 04/11/2019: BUN 18; Creatinine, Ser 1.00; Hemoglobin 17.3; Potassium 3.7; Sodium 141    Lipid Panel     Component Value Date/Time   CHOL 130 11/02/2017 0425   TRIG 81 11/02/2017 0425   HDL 31 (L) 11/02/2017 0425   CHOLHDL 4.2 11/02/2017 0425   VLDL 16 11/02/2017 0425   LDLCALC 83 11/02/2017 0425     Wt Readings from Last 3 Encounters:  05/13/19 273 lb 3.2 oz (123.9 kg)  04/11/19 270 lb (122.5 kg)  04/08/19 276 lb (125.2 kg)      Other studies Reviewed: Additional studies/ records that were reviewed today include: TTE 01/15/19  Review of the above records today demonstrates:  - Left ventricle: The cavity size was normal. Wall thickness was normal. Systolic function was mildly to moderately reduced. The estimated ejection fraction was in the range of 40% to 45%. Wall motion was normal; there were no regional wall motion abnormalities. Doppler parameters  are consistent with abnormal left ventricular relaxation (grade 1 diastolic dysfunction). - Right ventricle: The cavity size was mildly dilated. - Right atrium: The atrium was mildly dilated. - Pulmonary arteries: Systolic pressure was mildly to moderately increased. PA peak pressure: 40 mm Hg (S).   ASSESSMENT AND PLAN:  1.  Persistent atrial fibrillation: Currently on amiodarone and warfarin.  CHA2DS2-VASc of 4.  Recent cardioversion.  Fortunately he has gone back into sinus rhythm without issue.  He was previously on metoprolol 200 mg twice a day.  He has been on that for 1 month.   decrease to once a day.  He is also having some weakness and fatigue.  We  stop his metoprolol and put him on Toprol-XL 100 mg at night.  2.  Obstructive sleep apnea: APAP compliance encouraged  3.  Chronic combined systolic and diastolic heart failure: No obvious signs of volume overload.  No changes.  Case discussed with referring cardiologist  Current medicines are reviewed at length with the patient today.   The patient does not have concerns regarding his medicines.  The following changes were made today: Decrease amiodarone, stop metoprolol, start Toprol-XL  Labs/ tests ordered today include:  Orders Placed This Encounter  Procedures  . EKG 12-Lead     Disposition:   FU with   6 months  Signed,  Meredith Leeds, MD  05/13/2019 4:07 PM     [address] [place] [address] [phone] (office) [phone] (fax)

## 2019-05-13 NOTE — Patient Instructions (Signed)
Medication Instructions:  Your physician has recommended you make the following change in your medication:  1. DECREASE Amiodarone to 200 mg ONCE a day 2. STOP Metoprolol tartrate (Lopressor) 3. START Metoprolol succinate (Toprol) 100 mg ONCE a day at bedtime  *If you need a refill on your cardiac medications before your next appointment, please call your pharmacy*   Lab Work: None ordered If you have labs (blood work) drawn today and your tests are completely normal, you will receive your results only by: Marland Kitchen MyChart Message (if you have MyChart) OR . A paper copy in the mail If you have any lab test that is abnormal or we need to change your treatment, we will call you to review the results.   Testing/Procedures: None ordered   Follow-Up: At [facility], you and your health needs are our priority.  As part of our continuing mission to provide you with exceptional heart care, we have created designated Provider Care Teams.  These Care Teams include your primary Cardiologist (physician) and Advanced Practice Providers (APPs -  Physician Assistants and Nurse Practitioners) who all work together to provide you with the care you need, when you need it.  We recommend signing up for the patient portal called "MyChart".  Sign up information is provided on this After Visit Summary.  MyChart is used to connect with patients for Virtual Visits (Telemedicine).  Patients are able to view lab/test results, encounter notes, upcoming appointments, etc.  Non-urgent messages can be sent to your provider as well.   To learn more about what you can do with MyChart, go to [email or web address].    Your next appointment:   6 month(s)  The format for your next appointment:   In Person  Provider:   Allegra Lai, MD   Thank you for choosing [facility]!!   Trinidad Curet, RN [phone]    Other Instructions

## 2019-05-13 NOTE — Addendum Note (Signed)
Addended by: Stanton Kidney on: 05/13/2019 04:30 PM   Modules accepted: Orders

## 2019-05-29 DIAGNOSIS — Z7901 Long term (current) use of anticoagulants: Secondary | ICD-10-CM | POA: Diagnosis not present

## 2019-05-29 DIAGNOSIS — I480 Paroxysmal atrial fibrillation: Secondary | ICD-10-CM | POA: Diagnosis not present

## 2019-06-12 ENCOUNTER — Other Ambulatory Visit: Payer: Self-pay

## 2019-06-12 NOTE — Patient Outreach (Signed)
[facility]) Care Management  [place]  06/12/2019   Ruben Murray 1945/08/01 [id number]  Subjective: Telephone call to patient for bi-monthly call. Patient reports that he is doing ok. Patient reports that his wife is coming along after hospitalization.  Patient has had both COVID vaccines.  He states he saw his PCP about 2 weeks ago.  He continues to weigh daily and weights around 268 lbs.  Discussed with patient heart failure and importance of continued maintenance.  He verbalized understanding and voices no concerns.    Objective:   Encounter Medications:  Outpatient Encounter Medications as of 06/12/2019  Medication Sig  . acetaminophen (TYLENOL) 500 MG tablet Take 500-1,000 mg by mouth daily as needed for moderate pain.   Marland Kitchen amiodarone (PACERONE) 200 MG tablet Take 1 tablet (200 mg total) by mouth daily.  . ARTIFICIAL TEAR SOLUTION OP Place 1 drop into both eyes 2 (two) times daily.  . famotidine (PEPCID) 20 MG tablet One at bedtime  . finasteride (PROSCAR) 5 MG tablet Take 5 mg by mouth daily at 12 noon.   . fluticasone (FLONASE) 50 MCG/ACT nasal spray Place 2 sprays into both nostrils 2 (two) times daily.  . furosemide (LASIX) 40 MG tablet Take 1 tablet (40 mg total) by mouth daily.  Marland Kitchen loratadine (CLARITIN) 10 MG tablet Take 10 mg by mouth daily at 12 noon.   Marland Kitchen losartan (COZAAR) 50 MG tablet Take 1 tablet (50 mg total) by mouth daily. To replace Entresto.  . metoprolol succinate (TOPROL-XL) 100 MG 24 hr tablet Take 1 tablet (100 mg total) by mouth at bedtime. Take with or immediately following a meal.  . Multiple Vitamin (MULTIVITAMIN WITH MINERALS) TABS tablet Take 1 tablet by mouth daily at 12 noon.  Marland Kitchen PHENobarbital (LUMINAL) 97.2 MG tablet Take 97.2 mg by mouth at bedtime.   . potassium chloride SA (KLOR-CON) 20 MEQ tablet Take 10 mEq by mouth daily at 12 noon.  . pravastatin (PRAVACHOL) 40 MG tablet Take 40 mg by mouth at bedtime.   Marland Kitchen warfarin  (COUMADIN) 3 MG tablet Take 3 mg by mouth daily.    No facility-administered encounter medications on file as of 06/12/2019.    Functional Status:  In your present state of health, do you have any difficulty performing the following activities: 06/12/2019  Hearing? Y  Comment trouble hearing at times  Vision? N  Difficulty concentrating or making decisions? N  Walking or climbing stairs? Y  Comment knee problems  Dressing or bathing? N  Doing errands, shopping? N  Preparing Food and eating ? N  Using the Toilet? N  In the past six months, have you accidently leaked urine? N  Do you have problems with loss of bowel control? N  Managing your Medications? N  Managing your Finances? N  Housekeeping or managing your Housekeeping? N  Some recent data might be hidden    Fall/Depression Screening: Fall Risk  06/12/2019 04/15/2019 11/15/2018  Falls in the past year? 0 0 0  Number falls in past yr: - - -  Injury with Fall? - - -  Risk for fall due to : - - History of fall(s);Medication side effect;Impaired balance/gait;Impaired mobility;Impaired vision  Follow up - - Falls evaluation completed;Education provided;Falls prevention discussed   PHQ 2/9 Scores 06/12/2019 06/07/2018 02/18/2018 11/15/2017  PHQ - 2 Score 0 0 1 0    Assessment:  Patient continues to manage chronic illnesses.    Plan:  [place]  Problem One     Most Recent Value  Care Plan Problem One  Continued learning related to diagnosis of heart failure and management of atrial fibrillation  Role Documenting the Problem One  Care Management Telephonic Coordinator  Care Plan for Problem One  Active  [facility] Long Term Goal   Patient will report no A. Fibrillation and will continue to watch for signs of Heart Failure within 90 days.  [facility] Long Term Goal Start Date  06/12/19  Interventions for Problem One Long Term Goal  Patient continues to weight daily.  Patient limits salt.  Discussed importacne of heart failure management.        RN CM will contact again in the month of July and patient agreeable.    [name], RN, MSN [place] Management Care Management Coordinator Direct Line [phone] Cell [phone] Toll Free: [phone]  Fax: [phone]

## 2019-06-23 DIAGNOSIS — Z7901 Long term (current) use of anticoagulants: Secondary | ICD-10-CM | POA: Diagnosis not present

## 2019-06-23 DIAGNOSIS — I480 Paroxysmal atrial fibrillation: Secondary | ICD-10-CM | POA: Diagnosis not present

## 2019-07-01 DIAGNOSIS — I10 Essential (primary) hypertension: Secondary | ICD-10-CM | POA: Diagnosis not present

## 2019-07-01 DIAGNOSIS — I4811 Longstanding persistent atrial fibrillation: Secondary | ICD-10-CM | POA: Diagnosis not present

## 2019-07-01 DIAGNOSIS — E7800 Pure hypercholesterolemia, unspecified: Secondary | ICD-10-CM | POA: Diagnosis not present

## 2019-07-01 DIAGNOSIS — I5042 Chronic combined systolic (congestive) and diastolic (congestive) heart failure: Secondary | ICD-10-CM | POA: Diagnosis not present

## 2019-07-01 DIAGNOSIS — R7303 Prediabetes: Secondary | ICD-10-CM | POA: Diagnosis not present

## 2019-07-01 DIAGNOSIS — Z7901 Long term (current) use of anticoagulants: Secondary | ICD-10-CM | POA: Diagnosis not present

## 2019-07-01 DIAGNOSIS — G40909 Epilepsy, unspecified, not intractable, without status epilepticus: Secondary | ICD-10-CM | POA: Diagnosis not present

## 2019-07-06 DIAGNOSIS — Z1211 Encounter for screening for malignant neoplasm of colon: Secondary | ICD-10-CM | POA: Diagnosis not present

## 2019-07-13 DIAGNOSIS — R0681 Apnea, not elsewhere classified: Secondary | ICD-10-CM | POA: Diagnosis not present

## 2019-07-13 DIAGNOSIS — G4733 Obstructive sleep apnea (adult) (pediatric): Secondary | ICD-10-CM | POA: Diagnosis not present

## 2019-07-22 ENCOUNTER — Telehealth: Payer: Self-pay | Admitting: *Deleted

## 2019-07-22 DIAGNOSIS — Z7901 Long term (current) use of anticoagulants: Secondary | ICD-10-CM | POA: Diagnosis not present

## 2019-07-22 DIAGNOSIS — I480 Paroxysmal atrial fibrillation: Secondary | ICD-10-CM | POA: Diagnosis not present

## 2019-07-22 NOTE — Telephone Encounter (Addendum)
Faxed to [phone] Confirmation recevied

## 2019-07-22 NOTE — Telephone Encounter (Signed)
-----  Message from Stanton Kidney, RN sent at 07/17/2019  9:43 AM EDT ----- Regarding: FW: Note about sleep study results Will forward information to patient's PCP, Dr. Mannie Stabile. Per Dr. Curt Bears - will have PCP address oxygen needs/orders.  Trinidad Curet, RN ----- Message ----- From: [name], MD Sent: 07/15/2019   3:41 PM EDT To: Stanton Kidney, RN Subject: FW: Note about sleep study results              ----- Message ----- From: Marius Ditch, MD Sent: 07/14/2019   7:56 PM EDT To: [name], MD Subject: Note about sleep study results                 Hi Dr. Curt Bears, I repeated Mr. Lamons home sleep apnea test in his recliner (in which he sleeps due to chronic LBP). He has minimal sleep disordered breathing while sleeping in his recliner. His AHI is 4.3/hr, and that does not qualify him for treatment. However, I note that on this study, compared to his 2018 home sleep test also done in his recliner, that he has more hypoxemia. He has an O2 min of 72% with 30 minutes below O2 sat of 89%. This was 4% of his measured oximetry time. He has both CHF (40% EF in 2018) and COPD. I think the best strategy would be for him to use oxygen. What would be your thoughts? I can fax a copy of his home sleep test to you if needed to support need for oxygen. Clair Gulling

## 2019-08-01 DIAGNOSIS — Z7901 Long term (current) use of anticoagulants: Secondary | ICD-10-CM | POA: Diagnosis not present

## 2019-08-01 DIAGNOSIS — I480 Paroxysmal atrial fibrillation: Secondary | ICD-10-CM | POA: Diagnosis not present

## 2019-08-11 ENCOUNTER — Other Ambulatory Visit: Payer: Self-pay | Admitting: Cardiology

## 2019-08-13 DIAGNOSIS — R972 Elevated prostate specific antigen [PSA]: Secondary | ICD-10-CM | POA: Diagnosis not present

## 2019-08-13 DIAGNOSIS — N401 Enlarged prostate with lower urinary tract symptoms: Secondary | ICD-10-CM | POA: Diagnosis not present

## 2019-08-13 DIAGNOSIS — R350 Frequency of micturition: Secondary | ICD-10-CM | POA: Diagnosis not present

## 2019-08-15 DIAGNOSIS — I480 Paroxysmal atrial fibrillation: Secondary | ICD-10-CM | POA: Diagnosis not present

## 2019-08-15 DIAGNOSIS — Z7901 Long term (current) use of anticoagulants: Secondary | ICD-10-CM | POA: Diagnosis not present

## 2019-08-31 ENCOUNTER — Encounter (HOSPITAL_COMMUNITY): Payer: Self-pay | Admitting: Emergency Medicine

## 2019-08-31 ENCOUNTER — Other Ambulatory Visit: Payer: Self-pay

## 2019-08-31 ENCOUNTER — Emergency Department (HOSPITAL_COMMUNITY): Payer: Medicare HMO

## 2019-08-31 ENCOUNTER — Emergency Department (HOSPITAL_COMMUNITY)
Admission: EM | Admit: 2019-08-31 | Discharge: 2019-09-01 | Disposition: A | Discharge status: Home / Self Care | Payer: Medicare HMO | Attending: Emergency Medicine | Admitting: Emergency Medicine

## 2019-08-31 DIAGNOSIS — S6991XA Unspecified injury of right wrist, hand and finger(s), initial encounter: Secondary | ICD-10-CM | POA: Diagnosis present

## 2019-08-31 DIAGNOSIS — Y999 Unspecified external cause status: Secondary | ICD-10-CM | POA: Insufficient documentation

## 2019-08-31 DIAGNOSIS — R000 Tachycardia, unspecified: Secondary | ICD-10-CM | POA: Diagnosis not present

## 2019-08-31 DIAGNOSIS — M25531 Pain in right wrist: Secondary | ICD-10-CM | POA: Diagnosis not present

## 2019-08-31 DIAGNOSIS — Y92410 Unspecified street and highway as the place of occurrence of the external cause: Secondary | ICD-10-CM | POA: Diagnosis not present

## 2019-08-31 DIAGNOSIS — Y9389 Activity, other specified: Secondary | ICD-10-CM | POA: Insufficient documentation

## 2019-08-31 DIAGNOSIS — Z7901 Long term (current) use of anticoagulants: Secondary | ICD-10-CM | POA: Diagnosis not present

## 2019-08-31 DIAGNOSIS — S62114A Nondisplaced fracture of triquetrum [cuneiform] bone, right wrist, initial encounter for closed fracture: Secondary | ICD-10-CM | POA: Insufficient documentation

## 2019-08-31 DIAGNOSIS — I4891 Unspecified atrial fibrillation: Secondary | ICD-10-CM | POA: Insufficient documentation

## 2019-08-31 DIAGNOSIS — I4510 Unspecified right bundle-branch block: Secondary | ICD-10-CM | POA: Diagnosis not present

## 2019-08-31 DIAGNOSIS — I10 Essential (primary) hypertension: Secondary | ICD-10-CM | POA: Diagnosis not present

## 2019-08-31 DIAGNOSIS — J90 Pleural effusion, not elsewhere classified: Secondary | ICD-10-CM | POA: Diagnosis not present

## 2019-08-31 HISTORY — DX: Other specified postprocedural states: Z98.890

## 2019-08-31 LAB — COMPREHENSIVE METABOLIC PANEL
ALT: 30 U/L (ref 0–44)
AST: 32 U/L (ref 15–41)
Albumin: 4.1 g/dL (ref 3.5–5.0)
Alkaline Phosphatase: 116 U/L (ref 38–126)
Anion gap: 13 (ref 5–15)
BUN: 16 mg/dL (ref 8–23)
CO2: 31 mmol/L (ref 22–32)
Calcium: 9 mg/dL (ref 8.9–10.3)
Chloride: 102 mmol/L (ref 98–111)
Creatinine, Ser: 1.35 mg/dL — ABNORMAL HIGH (ref 0.61–1.24)
GFR calc Af Amer: 59 mL/min — ABNORMAL LOW (ref >60)
GFR calc non Af Amer: 51 mL/min — ABNORMAL LOW (ref >60)
Glucose, Bld: 112 mg/dL — ABNORMAL HIGH (ref 70–99)
Potassium: 3.4 mmol/L — ABNORMAL LOW (ref 3.5–5.1)
Sodium: 146 mmol/L — ABNORMAL HIGH (ref 135–145)
Total Bilirubin: 0.5 mg/dL (ref 0.3–1.2)
Total Protein: 7.1 g/dL (ref 6.5–8.1)

## 2019-08-31 LAB — I-STAT CHEM 8, ED
BUN: 22 mg/dL (ref 8–23)
Calcium, Ion: 1.01 mmol/L — ABNORMAL LOW (ref 1.15–1.40)
Chloride: 104 mmol/L (ref 98–111)
Creatinine, Ser: 1.5 mg/dL — ABNORMAL HIGH (ref 0.61–1.24)
Glucose, Bld: 97 mg/dL (ref 70–99)
HCT: 47 % (ref 39.0–52.0)
Hemoglobin: 16 g/dL (ref 13.0–17.0)
Potassium: 3.8 mmol/L (ref 3.5–5.1)
Sodium: 145 mmol/L (ref 135–145)
TCO2: 31 mmol/L (ref 22–32)

## 2019-08-31 LAB — CBC
HCT: 47.6 % (ref 39.0–52.0)
Hemoglobin: 15.1 g/dL (ref 13.0–17.0)
MCH: 31.3 pg (ref 26.0–34.0)
MCHC: 31.7 g/dL (ref 30.0–36.0)
MCV: 98.6 fL (ref 80.0–100.0)
Platelets: 190 10*3/uL (ref 150–400)
RBC: 4.83 MIL/uL (ref 4.22–5.81)
RDW: 12.9 % (ref 11.5–15.5)
WBC: 9.2 10*3/uL (ref 4.0–10.5)
nRBC: 0 % (ref 0.0–0.2)

## 2019-08-31 LAB — PROTIME-INR
INR: 2.5 — ABNORMAL HIGH (ref 0.8–1.2)
Prothrombin Time: 25.8 seconds — ABNORMAL HIGH (ref 11.4–15.2)

## 2019-08-31 LAB — SAMPLE TO BLOOD BANK

## 2019-08-31 LAB — ETHANOL: Alcohol, Ethyl (B): <10 mg/dL (ref <10)

## 2019-08-31 LAB — MAGNESIUM: Magnesium: 2 mg/dL (ref 1.7–2.4)

## 2019-08-31 LAB — LACTIC ACID, PLASMA: Lactic Acid, Venous: 1.4 mmol/L (ref 0.5–1.9)

## 2019-08-31 MED ORDER — SODIUM CHLORIDE 0.9 % IV BOLUS
1000.0000 mL | Freq: Once | INTRAVENOUS | Status: AC
  Administered 2019-08-31: 1000 mL via INTRAVENOUS
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
## 2019-08-31 NOTE — Discharge Instructions (Addendum)
Your work-up today was only significant for the possible right wrist fracture of the triquetrum bone.  Your heart rate was fast but it appeared to be a sinus rhythm and not A. fib today.  It improved with the fluids.  Please follow-up with your primary doctor for the fast heart rate and follow-up with the hand doctor for the possible wrist injury.  Please rest and stay hydrated.  If any symptoms change or worsen, please return to the nearest emergency department.

## 2019-09-01 NOTE — Progress Notes (Signed)
Orthopedic Tech Progress Note Patient Details:  DACEN FRAYRE 1946-01-20 [id number]  Ortho Devices Type of Ortho Device: Thumb velcro splint Ortho Device/Splint Location: rue Ortho Device/Splint Interventions: Ordered, Application, Adjustment   Post Interventions Patient Tolerated: Well Instructions Provided: Care of device, Adjustment of device   Karolee Stamps 09/01/2019, 1:06 AM

## 2019-09-03 DIAGNOSIS — T148XXA Other injury of unspecified body region, initial encounter: Secondary | ICD-10-CM | POA: Diagnosis not present

## 2019-09-03 DIAGNOSIS — R7303 Prediabetes: Secondary | ICD-10-CM | POA: Diagnosis not present

## 2019-09-03 DIAGNOSIS — S62114D Nondisplaced fracture of triquetrum [cuneiform] bone, right wrist, subsequent encounter for fracture with routine healing: Secondary | ICD-10-CM | POA: Diagnosis not present

## 2019-09-03 DIAGNOSIS — I480 Paroxysmal atrial fibrillation: Secondary | ICD-10-CM | POA: Diagnosis not present

## 2019-09-03 DIAGNOSIS — E876 Hypokalemia: Secondary | ICD-10-CM | POA: Diagnosis not present

## 2019-09-03 DIAGNOSIS — Z23 Encounter for immunization: Secondary | ICD-10-CM | POA: Diagnosis not present

## 2019-09-04 DIAGNOSIS — H524 Presbyopia: Secondary | ICD-10-CM | POA: Diagnosis not present

## 2019-09-04 DIAGNOSIS — Z961 Presence of intraocular lens: Secondary | ICD-10-CM | POA: Diagnosis not present

## 2019-09-11 ENCOUNTER — Other Ambulatory Visit: Payer: Self-pay

## 2019-09-11 DIAGNOSIS — I480 Paroxysmal atrial fibrillation: Secondary | ICD-10-CM | POA: Diagnosis not present

## 2019-09-11 DIAGNOSIS — Z7901 Long term (current) use of anticoagulants: Secondary | ICD-10-CM | POA: Diagnosis not present

## 2019-09-11 NOTE — Patient Outreach (Signed)
[facility]) Care Management  09/11/2019  [name] 1945-05-09 [id number]   Telephone call to patient for disease management follow up. Patient reports that he is busy. Advised that CM would try again on tomorrow. He is in agreement.   Plan: RN CM will outreach on tomorrow.  [name], RN, MSN [place] Management Care Management Coordinator Direct Line [phone] Cell [phone] Toll Free: [phone]  Fax: [phone]

## 2019-09-12 ENCOUNTER — Other Ambulatory Visit: Payer: Self-pay
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
## 2019-09-24 DIAGNOSIS — I480 Paroxysmal atrial fibrillation: Secondary | ICD-10-CM | POA: Diagnosis not present

## 2019-09-24 DIAGNOSIS — E876 Hypokalemia: Secondary | ICD-10-CM | POA: Diagnosis not present

## 2019-09-24 DIAGNOSIS — Z7901 Long term (current) use of anticoagulants: Secondary | ICD-10-CM | POA: Diagnosis not present

## 2019-09-29 DIAGNOSIS — H6123 Impacted cerumen, bilateral: Secondary | ICD-10-CM | POA: Diagnosis not present

## 2019-10-09 DIAGNOSIS — E876 Hypokalemia: Secondary | ICD-10-CM | POA: Diagnosis not present

## 2019-10-09 DIAGNOSIS — I4891 Unspecified atrial fibrillation: Secondary | ICD-10-CM | POA: Diagnosis not present

## 2019-10-09 DIAGNOSIS — Z7901 Long term (current) use of anticoagulants: Secondary | ICD-10-CM | POA: Diagnosis not present

## 2019-10-13 DIAGNOSIS — Z7901 Long term (current) use of anticoagulants: Secondary | ICD-10-CM | POA: Diagnosis not present

## 2019-10-13 DIAGNOSIS — D6869 Other thrombophilia: Secondary | ICD-10-CM | POA: Diagnosis not present

## 2019-10-13 DIAGNOSIS — N400 Enlarged prostate without lower urinary tract symptoms: Secondary | ICD-10-CM | POA: Diagnosis not present

## 2019-10-13 DIAGNOSIS — R7303 Prediabetes: Secondary | ICD-10-CM | POA: Diagnosis not present

## 2019-10-13 DIAGNOSIS — I480 Paroxysmal atrial fibrillation: Secondary | ICD-10-CM | POA: Diagnosis not present

## 2019-10-13 DIAGNOSIS — Z0000 Encounter for general adult medical examination without abnormal findings: Secondary | ICD-10-CM | POA: Diagnosis not present

## 2019-10-13 DIAGNOSIS — E7800 Pure hypercholesterolemia, unspecified: Secondary | ICD-10-CM | POA: Diagnosis not present

## 2019-10-13 DIAGNOSIS — G40909 Epilepsy, unspecified, not intractable, without status epilepticus: Secondary | ICD-10-CM | POA: Diagnosis not present

## 2019-10-13 DIAGNOSIS — I10 Essential (primary) hypertension: Secondary | ICD-10-CM | POA: Diagnosis not present

## 2019-10-23 DIAGNOSIS — Z7901 Long term (current) use of anticoagulants: Secondary | ICD-10-CM | POA: Diagnosis not present

## 2019-10-23 DIAGNOSIS — I4891 Unspecified atrial fibrillation: Secondary | ICD-10-CM | POA: Diagnosis not present

## 2019-10-24 DIAGNOSIS — Z1159 Encounter for screening for other viral diseases: Secondary | ICD-10-CM | POA: Diagnosis not present

## 2019-10-28 ENCOUNTER — Other Ambulatory Visit: Payer: Self-pay | Admitting: Physician Assistant

## 2019-10-28 DIAGNOSIS — I10 Essential (primary) hypertension: Secondary | ICD-10-CM

## 2019-10-28 DIAGNOSIS — J449 Chronic obstructive pulmonary disease, unspecified: Secondary | ICD-10-CM

## 2019-10-28 DIAGNOSIS — U071 COVID-19: Secondary | ICD-10-CM

## 2019-10-28 DIAGNOSIS — I5043 Acute on chronic combined systolic (congestive) and diastolic (congestive) heart failure: Secondary | ICD-10-CM

## 2019-10-28 NOTE — Progress Notes (Signed)
I connected by phone with Ruben Murray on 10/28/2019 at 9:23 AM to discuss the potential use of a new treatment for mild to moderate COVID-19 viral infection in non-hospitalized patients.  This patient is a 74 y.o. male that meets the FDA criteria for Emergency Use Authorization of COVID monoclonal antibody casirivimab/imdevimab.  Has a (+) direct SARS-CoV-2 viral test result  Has mild or moderate COVID-19   Is NOT hospitalized due to COVID-19  Is within 10 days of symptom onset  Has at least one of the high risk factor(s) for progression to severe COVID-19 and/or hospitalization as defined in EUA.  Specific high risk criteria : Older age (>/= 76 yo), BMI > 25, Cardiovascular disease or hypertension and Chronic Lung Disease   I have spoken and communicated the following to the patient or parent/caregiver regarding COVID monoclonal antibody treatment:  1. FDA has authorized the emergency use for the treatment of mild to moderate COVID-19 in adults and pediatric patients with positive results of direct SARS-CoV-2 viral testing who are 50 years of age and older weighing at least 40 kg, and who are at high risk for progressing to severe COVID-19 and/or hospitalization.  2. The significant known and potential risks and benefits of COVID monoclonal antibody, and the extent to which such potential risks and benefits are unknown.  3. Information on available alternative treatments and the risks and benefits of those alternatives, including clinical trials.  4. Patients treated with COVID monoclonal antibody should continue to self-isolate and use infection control measures (e.g., wear mask, isolate, social distance, avoid sharing personal items, clean and disinfect "high touch" surfaces, and frequent handwashing) according to CDC guidelines.   5. The patient or parent/caregiver has the option to accept or refuse COVID monoclonal antibody treatment.  After reviewing this information with the  patient, The patient agreed to proceed with receiving casirivimab\imdevimab infusion and will be provided a copy of the Fact sheet prior to receiving the infusion.  Sx onset 8/18. Set up for infusion on 8/25 @ 10:30am. Directions given to [facility]. Pt is aware that insurance will be charged an infusion fee. Pt is fully vaccinated and added to breakthrough list.   [name] Form 10/28/2019 9:23 AM

## 2019-10-29 ENCOUNTER — Other Ambulatory Visit: Payer: Self-pay | Admitting: Physician Assistant

## 2019-10-29 ENCOUNTER — Ambulatory Visit (HOSPITAL_COMMUNITY)
Admission: RE | Admit: 2019-10-29 | Discharge: 2019-10-29 | Disposition: A | Discharge status: Home / Self Care | Payer: Medicare Other | Source: Ambulatory Visit | Attending: Pulmonary Disease | Admitting: Pulmonary Disease

## 2019-10-29 DIAGNOSIS — I5043 Acute on chronic combined systolic (congestive) and diastolic (congestive) heart failure: Secondary | ICD-10-CM | POA: Insufficient documentation

## 2019-10-29 DIAGNOSIS — J449 Chronic obstructive pulmonary disease, unspecified: Secondary | ICD-10-CM | POA: Insufficient documentation

## 2019-10-29 DIAGNOSIS — U071 COVID-19: Secondary | ICD-10-CM

## 2019-10-29 DIAGNOSIS — R000 Tachycardia, unspecified: Secondary | ICD-10-CM | POA: Insufficient documentation

## 2019-10-29 DIAGNOSIS — I10 Essential (primary) hypertension: Secondary | ICD-10-CM

## 2019-10-29 DIAGNOSIS — I110 Hypertensive heart disease with heart failure: Secondary | ICD-10-CM | POA: Diagnosis not present

## 2019-10-29 DIAGNOSIS — Z23 Encounter for immunization: Secondary | ICD-10-CM | POA: Insufficient documentation

## 2019-10-29 MED ORDER — EPINEPHRINE 0.3 MG/0.3ML IJ SOAJ: 0.3000 mg | Freq: Once | INTRAMUSCULAR | Status: DC | PRN

## 2019-10-29 MED ORDER — SODIUM CHLORIDE 0.9 % IV SOLN: INTRAVENOUS | Status: DC | PRN

## 2019-10-29 MED ORDER — ALBUTEROL SULFATE HFA 108 (90 BASE) MCG/ACT IN AERS: 2.0000 | INHALATION_SPRAY | Freq: Once | RESPIRATORY_TRACT | Status: DC | PRN

## 2019-10-29 MED ORDER — METOPROLOL TARTRATE 25 MG PO TABS
50.0000 mg | ORAL_TABLET | Freq: Once | ORAL | Status: AC
  Administered 2019-10-29: 50 mg via ORAL
  Filled 2019-10-29: qty 2

## 2019-10-29 MED ORDER — FAMOTIDINE IN NACL 20-0.9 MG/50ML-% IV SOLN: 20.0000 mg | Freq: Once | INTRAVENOUS | Status: DC | PRN

## 2019-10-29 MED ORDER — METOPROLOL TARTRATE 25 MG PO TABS: 25.0000 mg | ORAL_TABLET | Freq: Two times a day (BID) | ORAL | Status: DC | PRN

## 2019-10-29 MED ORDER — SODIUM CHLORIDE 0.9 % IV SOLN
1200.0000 mg | Freq: Once | INTRAVENOUS | Status: AC
  Administered 2019-10-29: 1200 mg via INTRAVENOUS
  Filled 2019-10-29: qty 10

## 2019-10-29 MED ORDER — METHYLPREDNISOLONE SODIUM SUCC 125 MG IJ SOLR: 125.0000 mg | Freq: Once | INTRAMUSCULAR | Status: DC | PRN

## 2019-10-29 MED ORDER — DIPHENHYDRAMINE HCL 50 MG/ML IJ SOLN: 50.0000 mg | Freq: Once | INTRAMUSCULAR | Status: DC | PRN

## 2019-10-29 NOTE — Progress Notes (Signed)
Pt tachycardic with HRs in 130s at infusion. BP is stable and pt asymptomatic. I have placed an order for lopressor 89m x 1.   KAngelena FormPA-C  [facility]

## 2019-10-29 NOTE — Progress Notes (Signed)
  Diagnosis: COVID-19  Physician:Dr Asencion Noble  Procedure: [facility] Med: casirivimab\imdevimab infusion - Provided patient with casirivimab\imdevimab fact sheet for patients, parents and caregivers prior to infusion.  Complications: No immediate complications noted.  Discharge: Discharged home   Ruben Murray 10/29/2019

## 2019-10-29 NOTE — Discharge Instructions (Signed)

## 2019-10-30 ENCOUNTER — Telehealth (HOSPITAL_COMMUNITY): Payer: Self-pay | Admitting: Physician Assistant

## 2019-10-30 NOTE — Telephone Encounter (Signed)
Called patient to schedule Virtual Visit per staff message from [place], [place].  Pt's vm box is full, unable to leave a message.  Will try to call back.  Pt was noted with AF at Infusion visit.

## 2019-10-31 NOTE — Telephone Encounter (Signed)
Called wife and patient's cell, no answer and VM is full.  Unable to leave a message will call back.

## 2019-11-03 ENCOUNTER — Inpatient Hospital Stay (HOSPITAL_COMMUNITY)
Admission: EM | Admit: 2019-11-03 | Discharge: 2019-11-06 | DRG: 281 | Disposition: A | Discharge status: Home / Self Care | Payer: Medicare HMO | Attending: Internal Medicine | Admitting: Internal Medicine

## 2019-11-03 ENCOUNTER — Emergency Department (HOSPITAL_COMMUNITY): Payer: Medicare HMO

## 2019-11-03 ENCOUNTER — Encounter (HOSPITAL_COMMUNITY): Payer: Self-pay

## 2019-11-03 ENCOUNTER — Other Ambulatory Visit: Payer: Self-pay

## 2019-11-03 DIAGNOSIS — I351 Nonrheumatic aortic (valve) insufficiency: Secondary | ICD-10-CM | POA: Diagnosis not present

## 2019-11-03 DIAGNOSIS — I252 Old myocardial infarction: Secondary | ICD-10-CM

## 2019-11-03 DIAGNOSIS — J449 Chronic obstructive pulmonary disease, unspecified: Secondary | ICD-10-CM | POA: Diagnosis present

## 2019-11-03 DIAGNOSIS — I700 Atherosclerosis of aorta: Secondary | ICD-10-CM | POA: Diagnosis present

## 2019-11-03 DIAGNOSIS — N179 Acute kidney failure, unspecified: Secondary | ICD-10-CM | POA: Diagnosis present

## 2019-11-03 DIAGNOSIS — R410 Disorientation, unspecified: Secondary | ICD-10-CM | POA: Diagnosis not present

## 2019-11-03 DIAGNOSIS — I4892 Unspecified atrial flutter: Secondary | ICD-10-CM | POA: Diagnosis present

## 2019-11-03 DIAGNOSIS — I480 Paroxysmal atrial fibrillation: Secondary | ICD-10-CM | POA: Diagnosis present

## 2019-11-03 DIAGNOSIS — I340 Nonrheumatic mitral (valve) insufficiency: Secondary | ICD-10-CM | POA: Diagnosis not present

## 2019-11-03 DIAGNOSIS — N1831 Chronic kidney disease, stage 3a: Secondary | ICD-10-CM | POA: Diagnosis present

## 2019-11-03 DIAGNOSIS — H903 Sensorineural hearing loss, bilateral: Secondary | ICD-10-CM | POA: Diagnosis present

## 2019-11-03 DIAGNOSIS — R569 Unspecified convulsions: Secondary | ICD-10-CM

## 2019-11-03 DIAGNOSIS — I083 Combined rheumatic disorders of mitral, aortic and tricuspid valves: Secondary | ICD-10-CM | POA: Diagnosis present

## 2019-11-03 DIAGNOSIS — Z8249 Family history of ischemic heart disease and other diseases of the circulatory system: Secondary | ICD-10-CM

## 2019-11-03 DIAGNOSIS — G4733 Obstructive sleep apnea (adult) (pediatric): Secondary | ICD-10-CM | POA: Diagnosis present

## 2019-11-03 DIAGNOSIS — I5022 Chronic systolic (congestive) heart failure: Secondary | ICD-10-CM | POA: Diagnosis present

## 2019-11-03 DIAGNOSIS — Z6839 Body mass index (BMI) 39.0-39.9, adult: Secondary | ICD-10-CM | POA: Diagnosis not present

## 2019-11-03 DIAGNOSIS — I214 Non-ST elevation (NSTEMI) myocardial infarction: Principal | ICD-10-CM | POA: Diagnosis present

## 2019-11-03 DIAGNOSIS — I361 Nonrheumatic tricuspid (valve) insufficiency: Secondary | ICD-10-CM | POA: Diagnosis not present

## 2019-11-03 DIAGNOSIS — I420 Dilated cardiomyopathy: Secondary | ICD-10-CM

## 2019-11-03 DIAGNOSIS — J4540 Moderate persistent asthma, uncomplicated: Secondary | ICD-10-CM | POA: Diagnosis present

## 2019-11-03 DIAGNOSIS — J9811 Atelectasis: Secondary | ICD-10-CM | POA: Diagnosis not present

## 2019-11-03 DIAGNOSIS — I517 Cardiomegaly: Secondary | ICD-10-CM | POA: Diagnosis not present

## 2019-11-03 DIAGNOSIS — Z8616 Personal history of COVID-19: Secondary | ICD-10-CM

## 2019-11-03 DIAGNOSIS — Z87891 Personal history of nicotine dependence: Secondary | ICD-10-CM

## 2019-11-03 DIAGNOSIS — D6869 Other thrombophilia: Secondary | ICD-10-CM | POA: Diagnosis present

## 2019-11-03 DIAGNOSIS — I499 Cardiac arrhythmia, unspecified: Secondary | ICD-10-CM | POA: Diagnosis not present

## 2019-11-03 DIAGNOSIS — I130 Hypertensive heart and chronic kidney disease with heart failure and stage 1 through stage 4 chronic kidney disease, or unspecified chronic kidney disease: Secondary | ICD-10-CM | POA: Diagnosis present

## 2019-11-03 DIAGNOSIS — E785 Hyperlipidemia, unspecified: Secondary | ICD-10-CM | POA: Diagnosis present

## 2019-11-03 DIAGNOSIS — I959 Hypotension, unspecified: Secondary | ICD-10-CM | POA: Diagnosis not present

## 2019-11-03 DIAGNOSIS — Z66 Do not resuscitate: Secondary | ICD-10-CM | POA: Diagnosis present

## 2019-11-03 DIAGNOSIS — Z8241 Family history of sudden cardiac death: Secondary | ICD-10-CM

## 2019-11-03 DIAGNOSIS — R55 Syncope and collapse: Secondary | ICD-10-CM | POA: Diagnosis not present

## 2019-11-03 DIAGNOSIS — I4891 Unspecified atrial fibrillation: Secondary | ICD-10-CM | POA: Diagnosis not present

## 2019-11-03 DIAGNOSIS — Z7901 Long term (current) use of anticoagulants: Secondary | ICD-10-CM

## 2019-11-03 DIAGNOSIS — I10 Essential (primary) hypertension: Secondary | ICD-10-CM | POA: Diagnosis present

## 2019-11-03 DIAGNOSIS — D6859 Other primary thrombophilia: Secondary | ICD-10-CM | POA: Diagnosis present

## 2019-11-03 DIAGNOSIS — Z79899 Other long term (current) drug therapy: Secondary | ICD-10-CM

## 2019-11-03 DIAGNOSIS — I272 Pulmonary hypertension, unspecified: Secondary | ICD-10-CM | POA: Diagnosis present

## 2019-11-03 DIAGNOSIS — R000 Tachycardia, unspecified: Secondary | ICD-10-CM | POA: Diagnosis not present

## 2019-11-03 DIAGNOSIS — Z96652 Presence of left artificial knee joint: Secondary | ICD-10-CM | POA: Diagnosis present

## 2019-11-03 DIAGNOSIS — Z888 Allergy status to other drugs, medicaments and biological substances status: Secondary | ICD-10-CM

## 2019-11-03 DIAGNOSIS — U071 COVID-19: Secondary | ICD-10-CM | POA: Diagnosis not present

## 2019-11-03 LAB — COMPREHENSIVE METABOLIC PANEL
ALT: 28 U/L (ref 0–44)
AST: 35 U/L (ref 15–41)
Albumin: 3.4 g/dL — ABNORMAL LOW (ref 3.5–5.0)
Alkaline Phosphatase: 113 U/L (ref 38–126)
Anion gap: 12 (ref 5–15)
BUN: 21 mg/dL (ref 8–23)
CO2: 25 mmol/L (ref 22–32)
Calcium: 8.6 mg/dL — ABNORMAL LOW (ref 8.9–10.3)
Chloride: 106 mmol/L (ref 98–111)
Creatinine, Ser: 1.51 mg/dL — ABNORMAL HIGH (ref 0.61–1.24)
GFR calc Af Amer: 51 mL/min — ABNORMAL LOW (ref >60)
GFR calc non Af Amer: 44 mL/min — ABNORMAL LOW (ref >60)
Glucose, Bld: 131 mg/dL — ABNORMAL HIGH (ref 70–99)
Potassium: 4.3 mmol/L (ref 3.5–5.1)
Sodium: 143 mmol/L (ref 135–145)
Total Bilirubin: 0.7 mg/dL (ref 0.3–1.2)
Total Protein: 6.4 g/dL — ABNORMAL LOW (ref 6.5–8.1)

## 2019-11-03 LAB — BRAIN NATRIURETIC PEPTIDE: B Natriuretic Peptide: 224.8 pg/mL — ABNORMAL HIGH (ref 0.0–100.0)

## 2019-11-03 LAB — CBC WITH DIFFERENTIAL/PLATELET
Abs Immature Granulocytes: 0.05 10*3/uL (ref 0.00–0.07)
Basophils Absolute: 0.1 10*3/uL (ref 0.0–0.1)
Basophils Relative: 1 %
Eosinophils Absolute: 0.1 10*3/uL (ref 0.0–0.5)
Eosinophils Relative: 1 %
HCT: 44.6 % (ref 39.0–52.0)
Hemoglobin: 13.8 g/dL (ref 13.0–17.0)
Immature Granulocytes: 1 %
Lymphocytes Relative: 9 %
Lymphs Abs: 1 10*3/uL (ref 0.7–4.0)
MCH: 30.4 pg (ref 26.0–34.0)
MCHC: 30.9 g/dL (ref 30.0–36.0)
MCV: 98.2 fL (ref 80.0–100.0)
Monocytes Absolute: 0.5 10*3/uL (ref 0.1–1.0)
Monocytes Relative: 5 %
Neutro Abs: 8.8 10*3/uL — ABNORMAL HIGH (ref 1.7–7.7)
Neutrophils Relative %: 83 %
Platelets: 198 10*3/uL (ref 150–400)
RBC: 4.54 MIL/uL (ref 4.22–5.81)
RDW: 14 % (ref 11.5–15.5)
WBC: 10.5 10*3/uL (ref 4.0–10.5)
nRBC: 0 % (ref 0.0–0.2)

## 2019-11-03 LAB — TROPONIN I (HIGH SENSITIVITY): Troponin I (High Sensitivity): 539 ng/L (ref <18)

## 2019-11-03 LAB — PROTIME-INR
INR: 2.7 — ABNORMAL HIGH (ref 0.8–1.2)
Prothrombin Time: 27.6 seconds — ABNORMAL HIGH (ref 11.4–15.2)

## 2019-11-03 MED ORDER — PRAVASTATIN SODIUM 40 MG PO TABS
40.0000 mg | ORAL_TABLET | Freq: Every day | ORAL | Status: DC
  Administered 2019-11-04 – 2019-11-05 (×3): 40 mg via ORAL
  Filled 2019-11-03 (×3): qty 1

## 2019-11-03 MED ORDER — LOSARTAN POTASSIUM 50 MG PO TABS
50.0000 mg | ORAL_TABLET | Freq: Every day | ORAL | Status: DC
  Administered 2019-11-04 – 2019-11-06 (×3): 50 mg via ORAL
  Filled 2019-11-03 (×3): qty 1

## 2019-11-03 MED ORDER — FINASTERIDE 5 MG PO TABS
5.0000 mg | ORAL_TABLET | Freq: Every day | ORAL | Status: DC
  Administered 2019-11-04 – 2019-11-06 (×3): 5 mg via ORAL
  Filled 2019-11-03 (×4): qty 1

## 2019-11-03 MED ORDER — ADULT MULTIVITAMIN W/MINERALS CH
1.0000 | ORAL_TABLET | Freq: Every day | ORAL | Status: DC
  Administered 2019-11-04 – 2019-11-06 (×3): 1 via ORAL
  Filled 2019-11-03 (×3): qty 1

## 2019-11-03 MED ORDER — LORATADINE 10 MG PO TABS
10.0000 mg | ORAL_TABLET | Freq: Every day | ORAL | Status: DC
  Administered 2019-11-04 – 2019-11-06 (×3): 10 mg via ORAL
  Filled 2019-11-03 (×3): qty 1

## 2019-11-03 MED ORDER — PHENOBARBITAL 97.2 MG PO TABS
97.2000 mg | ORAL_TABLET | Freq: Once | ORAL | Status: DC
  Filled 2019-11-03: qty 1

## 2019-11-03 MED ORDER — FAMOTIDINE 20 MG PO TABS
20.0000 mg | ORAL_TABLET | Freq: Two times a day (BID) | ORAL | Status: DC
  Administered 2019-11-04 – 2019-11-06 (×6): 20 mg via ORAL
  Filled 2019-11-03 (×6): qty 1

## 2019-11-03 MED ORDER — DILTIAZEM HCL-DEXTROSE 125-5 MG/125ML-% IV SOLN (PREMIX)
5.0000 mg/h | INTRAVENOUS | Status: DC
  Administered 2019-11-03 – 2019-11-04 (×2): 5 mg/h via INTRAVENOUS
  Filled 2019-11-03: qty 125

## 2019-11-03 MED ORDER — PHENOBARBITAL 97.2 MG PO TABS
97.2000 mg | ORAL_TABLET | Freq: Every day | ORAL | Status: DC
  Administered 2019-11-04 – 2019-11-05 (×3): 97.2 mg via ORAL
  Filled 2019-11-03 (×2): qty 1

## 2019-11-03 MED ORDER — FLUTICASONE PROPIONATE 50 MCG/ACT NA SUSP
2.0000 | Freq: Two times a day (BID) | NASAL | Status: DC
  Administered 2019-11-04 – 2019-11-06 (×5): 2 via NASAL
  Filled 2019-11-03 (×2): qty 16

## 2019-11-03 MED ORDER — METOPROLOL SUCCINATE ER 100 MG PO TB24
100.0000 mg | ORAL_TABLET | Freq: Every day | ORAL | Status: DC
  Administered 2019-11-04 (×2): 100 mg via ORAL
  Filled 2019-11-03: qty 1

## 2019-11-03 MED ORDER — ACETAMINOPHEN 650 MG RE SUPP: 650.0000 mg | Freq: Four times a day (QID) | RECTAL | Status: DC | PRN

## 2019-11-03 MED ORDER — ACETAMINOPHEN 325 MG PO TABS: 650.0000 mg | ORAL_TABLET | Freq: Four times a day (QID) | ORAL | Status: DC | PRN

## 2019-11-03 NOTE — ED Notes (Signed)
Update given to wife via telephone

## 2019-11-03 NOTE — ED Provider Notes (Addendum)
[facility] EMERGENCY DEPARTMENT Provider Note   CSN: [id number] Arrival date & time: 11/03/19  1821     History Chief Complaint  Patient presents with  . COVID+    Ruben Murray is a 74 y.o. male.  HPI   This patient is a 74 year old male, he has multiple medical problems including atrial fibrillation, he has a history of a cardiomyopathy with an ejection of 40 to 45%, history of CHF, history of COPD, history of hypertension and a history of cardioversion for atrial fibrillation in the past.  He presents today after developing acute onset of nausea and severe weakness which left him collapsed in the front yard of his apartment complex.  Some passersby helped him get back into his apartment, 911 was called and the patient was found to be in rapid tachycardia.  It appeared to be wide-complex, he was given Cardizem due to his history of atrial fibrillation which slowed his rate considerably into the 130s to 140s.  The patient continued to have mild symptoms but significantly improved after the medicines were given.  He denies chest pain, has had an occasional mild cough but nothing significant, he has not felt short of breath, he has chronic swelling of his legs which is no different than it has been in the past.  He is currently anticoagulated on warfarin.   Past Medical History:  Diagnosis Date  . Acquired thrombophilia ([place]) 01/15/2019  . Aortic atherosclerosis ([place])   . Atrial fibrillation with RVR ([place]) 10/29/2017  . CAP (community acquired pneumonia) 03/06/2016  . Cardiomyopathy ([place])    a. EF 40-45% in 04/2016, etiology not defined, managed medically.  . CHF exacerbation ([place]) 10/29/2017  . Chronic laryngitis 10/03/2016  . COPD GOLD II  08/02/2016   Quit smoking 2000  PFT's  07/10/2016  FEV1 1.98 (70 % ) ratio 67  p 19 % improvement from saba p nothing  prior to study while of coreg so rec as of 08/02/2016 try off coreg and on bisoprolol     . Dysphonia 10/03/2016    . Essential hypertension 03/06/2016   Changed from coreg to bisoprolol due to copd with reversible component  08/02/2016 >>>   . Fever 03/12/2015  . History of cardioversion   . Hoarseness 08/31/2016   Referred to ent 08/31/2016 >>> seen 10/03/16 [name] dx gerd and rhinitis medicamentosa   > improved on f/u 01/31/17 on gerd rx/ flonase and off afrin  . Hyperlipidemia   . Hypertension   . Laryngopharyngeal reflux (LPR) 10/03/2016  . Moderate persistent asthma 08/02/2016  . Morbid (severe) obesity due to excess calories ([place]) 08/02/2016  . Myocardial infarction [facility]) 1980-early 2000s X 2   "mild ones" (03/06/2016)  . Nasal polyps 10/03/2016  . OSA on CPAP    a. pt states was told by Dr. Maxwell Caul that he would not require CPAP as long as he continued to sleep in recliner which he does for his back.  . Paroxysmal atrial fibrillation ([place])   . Pneumonia    "when I was a kid" (03/06/2016)  . Prolonged QT interval 10/29/2017  . RBBB   . 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 Rhinitis medicamentosa 10/03/2016  . Right thyroid nodule 03/06/2016  . Seizures ([place])    "take RX daily" (03/06/2016)  . Sensorineural hearing loss (SNHL) of both ears 01/31/2017    Patient Active Problem List   Diagnosis Date Noted  . Acquired thrombophilia ([place]) 01/15/2019  . Persistent atrial fibrillation   .  Acute on chronic combined systolic and diastolic CHF (congestive heart failure) ([place]) 10/29/2017  . CHF exacerbation ([place]) 10/29/2017  . Prolonged QT interval 10/29/2017  . Sensorineural hearing loss (SNHL) of both ears 01/31/2017  . Dysphonia 10/03/2016  . Laryngopharyngeal reflux (LPR) 10/03/2016  . Nasal polyps 10/03/2016  . Rhinitis medicamentosa 10/03/2016  . Chronic laryngitis 10/03/2016  . Hoarseness 08/31/2016  . Moderate persistent asthma 08/02/2016  . COPD GOLD II  08/02/2016  . Morbid (severe) obesity due to excess calories ([place]) 08/02/2016  . CAP (community acquired pneumonia) 03/07/2016  . Community  acquired pneumonia 03/06/2016  . Essential hypertension 03/06/2016  . Hyperlipidemia 03/06/2016  . Right thyroid nodule 03/06/2016  . Seizures ([place]) 03/06/2016  . Fever 03/12/2015  . 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 CARDIOVERSION N/A 12/13/2017   Procedure: CARDIOVERSION;  Surgeon: [name], MD;  Location: [facility];  Service: Cardiovascular;  Laterality: N/A;  . CARDIOVERSION N/A 09/25/2018   Procedure: CARDIOVERSION;  Surgeon: [name], MD;  Location: [facility] ENDOSCOPY;  Service: Cardiovascular;  Laterality: N/A;  . CARDIOVERSION N/A 11/04/2018   Procedure: CARDIOVERSION;  Surgeon: [name], MD;  Location: [facility] ENDOSCOPY;  Service: Cardiovascular;  Laterality: N/A;  . CARDIOVERSION N/A 04/11/2019   Procedure: CARDIOVERSION;  Surgeon: [name], MD;  Location: [facility] ENDOSCOPY;  Service: Cardiovascular;  Laterality: N/A;  . CIRCUMCISION    . JOINT REPLACEMENT    . PERCUTANEOUS PINNING TOE FRACTURE Left    "big toe  . REPLACEMENT TOTAL KNEE Left   . TONSILLECTOMY AND ADENOIDECTOMY         Family History  Problem Relation Age of Onset  . Hypertension Mother   . Other Father        sudden cardiac arrest    Social History   Tobacco Use  . Smoking status: Former Smoker    Packs/day: 3.00    Years: 48.00    Pack years: 144.00    Types: Cigarettes    Quit date: 2000    Years since quitting: 21.6  . Smokeless tobacco: Never Used  Vaping Use  . Vaping Use: Never used  Substance Use Topics  . Alcohol use: Never  . Drug use: Never    Home Medications Prior to Admission medications   Medication Sig Start Date End Date Taking? Authorizing Provider  acetaminophen (TYLENOL) 500 MG tablet Take 500-1,000 mg by mouth daily as needed for moderate pain.     [provider]  amiodarone (PACERONE) 200 MG tablet Take 1 tablet (200 mg total) by mouth daily. 05/13/19   Camnitz, Ocie Doyne, MD    amiodarone (PACERONE) 200 MG tablet Take 200 mg by mouth daily with supper. 05/15/19   [provider]  ARTIFICIAL TEAR SOLUTION OP Place 1 drop into both eyes 2 (two) times daily.    [provider]  famotidine (PEPCID) 20 MG tablet One at bedtime 08/02/16   Tanda Rockers, MD  famotidine (PEPCID) 20 MG tablet Take 20 mg by mouth at bedtime. 03/05/19   [provider]  finasteride (PROSCAR) 5 MG tablet Take 5 mg by mouth daily at 12 noon.     [provider]  finasteride (PROSCAR) 5 MG tablet Take 5 mg by mouth daily with lunch. 07/28/19   [provider]  fluticasone (FLONASE) 50 MCG/ACT nasal spray Place 2 sprays into both nostrils 2 (two) times daily. 10/24/17   [provider]  fluticasone (FLONASE) 50 MCG/ACT nasal spray Place 1 spray into both nostrils 2 (two) times daily. 07/02/19   [provider]  furosemide (LASIX) 40 MG tablet Take 1 tablet (40 mg total) by mouth daily. 09/27/18   Isaiah Serge, NP  furosemide (LASIX) 40 MG tablet Take 40 mg by mouth daily with lunch. 07/17/19   [provider]  loratadine (CLARITIN) 10 MG tablet Take 10 mg by mouth daily at 12 noon.     [provider]  loratadine (CLARITIN) 10 MG tablet Take 10 mg by mouth daily with lunch.    [provider]  losartan (COZAAR) 50 MG tablet TAKE 1 TABLET BY MOUTH ONCE DAILY TO  REPLACE  ENTRESTO 08/13/19   [name], MD  losartan (COZAAR) 50 MG tablet Take 50 mg by mouth daily with lunch. 08/13/19   [provider]  metoprolol succinate (TOPROL-XL) 100 MG 24 hr tablet Take 1 tablet (100 mg total) by mouth at bedtime. Take with or immediately following a meal. 05/13/19   Camnitz, Ocie Doyne, MD  metoprolol succinate (TOPROL-XL) 100 MG 24 hr tablet Take 100 mg by mouth daily with supper. 05/13/19   [provider]  Multiple Vitamin (MULTIVITAMIN WITH MINERALS) TABS tablet Take 1 tablet by mouth daily at 12 noon.     [provider]  Multiple Vitamin (MULTIVITAMIN WITH MINERALS) TABS tablet Take 1 tablet by mouth daily with lunch.    [provider]  PHENobarbital (LUMINAL) 97.2 MG tablet Take 97.2 mg by mouth at bedtime.     [provider]  PHENobarbital (LUMINAL) 97.2 MG tablet Take 97.2 mg by mouth at bedtime. 07/14/19   [provider]  polyvinyl alcohol (ARTIFICIAL TEARS) 1.4 % ophthalmic solution Place 1 drop into both eyes daily with lunch.    [provider]  potassium chloride (KLOR-CON) 10 MEQ tablet Take 10 mEq by mouth daily with lunch. 08/01/19   [provider]  potassium chloride SA (KLOR-CON) 20 MEQ tablet Take 10 mEq by mouth daily at 12 noon.    [provider]  pravastatin (PRAVACHOL) 40 MG tablet Take 40 mg by mouth at bedtime.     [provider]  pravastatin (PRAVACHOL) 40 MG tablet Take 40 mg by mouth at bedtime. 07/11/19   [provider]  warfarin (COUMADIN) 3 MG tablet Take 3 mg by mouth daily.  07/31/18   [provider]  warfarin (COUMADIN) 3 MG tablet Take 3 mg by mouth daily with lunch.    [provider]    Allergies    Dilaudid [hydromorphone hcl], Dilaudid [hydromorphone], Tegretol [carbamazepine], and Tegretol [carbamazepine]  Review of Systems   Review of Systems  All other systems reviewed and are negative.   Physical Exam Updated Vital Signs BP 110/74   Pulse 88   Temp 97.8 F (36.6 C) (Oral)   Resp 12   Ht 1.727 m (_0 )   Wt 117.9 kg   SpO2 92%   BMI 39.53 kg/m   Physical Exam Vitals and nursing note reviewed.  Constitutional:      General: He is not in acute distress.    Appearance: He is well-developed.  HENT:     Head: Normocephalic and atraumatic.     Mouth/Throat:     Pharynx: No oropharyngeal exudate.  Eyes:     General: No scleral icterus.       Right eye: No discharge.        Left eye:  No discharge.     Conjunctiva/sclera: Conjunctivae  normal.     Pupils: Pupils are equal, round, and reactive to light.  Neck:     Thyroid: No thyromegaly.     Vascular: No JVD.  Cardiovascular:     Rate and Rhythm: Regular rhythm. Tachycardia present.     Heart sounds: Normal heart sounds. No murmur heard.  No friction rub. No gallop.   Pulmonary:     Effort: Pulmonary effort is normal. No respiratory distress.     Breath sounds: Normal breath sounds. No wheezing or rales.  Abdominal:     General: Bowel sounds are normal. There is no distension.     Palpations: Abdomen is soft. There is no mass.     Tenderness: There is no abdominal tenderness.  Musculoskeletal:        General: No tenderness. Normal range of motion.     Cervical back: Normal range of motion and neck supple.     Right lower leg: Edema present.     Left lower leg: Edema present.  Lymphadenopathy:     Cervical: No cervical adenopathy.  Skin:    General: Skin is warm and dry.     Findings: No erythema or rash.  Neurological:     Mental Status: He is alert.     Coordination: Coordination normal.  Psychiatric:        Behavior: Behavior normal.     ED Results / Procedures / Treatments   Labs (all labs ordered are listed, but only abnormal results are displayed) Labs Reviewed  CBC WITH DIFFERENTIAL/PLATELET - Abnormal; Notable for the following components:      Result Value   Neutro Abs 8.8 (*)    All other components within normal limits  COMPREHENSIVE METABOLIC PANEL - Abnormal; Notable for the following components:   Glucose, Bld 131 (*)    Creatinine, Ser 1.51 (*)    Calcium 8.6 (*)    Total Protein 6.4 (*)    Albumin 3.4 (*)    GFR calc non Af Amer 44 (*)    GFR calc Af Amer 51 (*)    All other components within normal limits  BRAIN NATRIURETIC PEPTIDE - Abnormal; Notable for the following components:   B Natriuretic Peptide 224.8 (*)    All other components within normal limits  PROTIME-INR - Abnormal; Notable for the following components:    Prothrombin Time 27.6 (*)    INR 2.7 (*)    All other components within normal limits  TROPONIN I (HIGH SENSITIVITY) - Abnormal; Notable for the following components:   Troponin I (High Sensitivity) 539 (*)    All other components within normal limits    EKG EKG Interpretation  Date/Time:  Monday November 03 2019 19:22:09 EDT Ventricular Rate:  91 PR Interval:    QRS Duration: 155 QT Interval:  499 QTC Calculation: 615 R Axis:   -70 Text Interpretation: atrial flutter with 3:1 block Atrial premature complexes Short PR interval RBBB and LAFB Confirmed by Noemi Chapel [phone]) on 11/03/2019 7:36:42 PM   Radiology DG Chest Port 1 View  Result Date: 11/03/2019 CLINICAL DATA:  Arrhythmia with history of COVID positive. EXAM: PORTABLE CHEST 1 VIEW COMPARISON:  August 31, 2019 FINDINGS: A very mild amount of atelectasis is seen within the left lung base. There is no evidence of a pleural effusion or pneumothorax. The cardiac silhouette is mildly enlarged. The visualized skeletal structures are unremarkable. IMPRESSION: 1. Very mild amount of left basilar atelectasis. 2.  Mild cardiomegaly. Electronically Signed   By: Virgina Norfolk M.D.   On: 11/03/2019 19:22    Procedures .Critical Care Performed by: Noemi Chapel, MD Authorized by: Noemi Chapel, MD   Critical care provider statement:    Critical care time (minutes):  35   Critical care time was exclusive of:  Separately billable procedures and treating other patients and teaching time   Critical care was necessary to treat or prevent imminent or life-threatening deterioration of the following conditions:  Cardiac failure   Critical care was time spent personally by me on the following activities:  Blood draw for specimens, development of treatment plan with patient or surrogate, discussions with consultants, evaluation of patient's response to treatment, examination of patient, obtaining history from patient or surrogate, ordering and  performing treatments and interventions, ordering and review of laboratory studies, ordering and review of radiographic studies, pulse oximetry, re-evaluation of patient's condition and review of old charts   (including critical care time)  Medications Ordered in ED Medications  diltiazem (CARDIZEM) 125 mg in dextrose 5% 125 mL (1 mg/mL) infusion (5 mg/hr Intravenous New Bag/Given 11/03/19 1913)    ED Course  I have reviewed the triage vital signs and the nursing notes.  Pertinent labs & imaging results that were available during my care of the patient were reviewed by me and considered in my medical decision making (see chart for details).    MDM Rules/Calculators/A&P                          This patient has developed acute onset of symptoms which is likely related to the severe tachycardia which was around 200 bpm prehospital.  At this time his rate is somewhat variable between 120 and 130 but definitely appears to be atrial flutter with either 2-1 block or variable block.  I see no signs of ST elevation though he does have some ST depression.  His right bundle branch block has been seen on prior EKGs.  Incidentally the patient was diagnosed with Covid 10 days ago, he was asymptomatic but tested only because people that he was around had Covid.  He has had no symptoms, he did have his vaccinations, again no Covid-like symptoms.  He has finished a 10-day quarantine ending yesterday.  He has not missed any doses of his warfarin or his other medications including amiodarone  Covid test found in the system  The patient's rate has now come down, he is in flutter with variable block but less than 100 bpm on Cardizem drip.  I discussed his care with the on-call cardiologist Dr. Harrington Challenger who agrees that she can follow the patient in consultation but it request admission to the hospitalist service.  D/w Dr. Hal Hope - will admit  Ruben Murray was evaluated in Emergency Department on 11/03/2019  for the symptoms described in the history of present illness. He was evaluated in the context of the global COVID-19 pandemic, which necessitated consideration that the patient might be at risk for infection with the SARS-CoV-2 virus that causes COVID-19. Institutional protocols and algorithms that pertain to the evaluation of patients at risk for COVID-19 are in a state of rapid change based on information released by regulatory bodies including the CDC and federal and state organizations. These policies and algorithms were followed during the patient's care in the ED.   Final Clinical Impression(s) / ED Diagnoses Final diagnoses:  NSTEMI (non-ST elevated myocardial infarction) ([place])  Atrial flutter  with rapid ventricular response [facility])    Rx / DC Orders ED Discharge Orders    None       Noemi Chapel, MD 11/03/19 2041    Noemi Chapel, MD 11/03/19 2046

## 2019-11-03 NOTE — ED Triage Notes (Signed)
Pt was picked up from his independent living apartment where he lives with his wife via [facility] d/t pt vomiting and feeling "terrible all over." EMS noted that he was tachy and arrhythmic at 240 bpm tops & 36m of Cardizem did convert him to a slower pace but re,ained tachy and irregular. Pt has a history of needing to be cardioverted 4 separate occasions d/t being in A-fib. Upon arrival to ED pt denies active n/v & he is A&Ox4, verbal- able to make needs known.

## 2019-11-03 NOTE — H&P (Signed)
History and Physical    Ruben Murray UJW:[id number] DOB: Jun 20, 1945 DOA: 11/03/2019  PCP: [name], MD  Patient coming from: Home.  Chief Complaint: Almost passed out.  HPI: Ruben Murray is a 74 y.o. male with history of nonischemic cardiomyopathy last EF measured in January 2020 was 40 to 45% with history of atrial fibrillation multiple cardioversions presently on Coumadin and metoprolol history of seizures and luminal recently diagnosed with COVID-19 infection has been on quarantine for 10 days of quarantine now for the last 24 hours was on his way to the bank when he felt not right and turned back to his house.  On reaching his house car park patient was walking and felt very dizzy and almost passed out.  EMS was called patient was found to be tachycardic and was brought to the ER.  Patient denies any chest pain but did have some nausea vomiting denies any abdominal pain or diarrhea.  ED Course: In the ER patient was in atrial flutter with RVR was started on Cardizem infusion.  Chest x-ray was unremarkable patient was not hypoxic or febrile.  Labs are significant for BNP of 224 and high sensitive troponin of 539.  Creatinine 1.5 and INR of 2.7.  Cardiology on-call was consulted and patient admitted for atrial flutter with RVR and high sensitive troponin.  Review of Systems: As per HPI, rest all negative.   Past Medical History:  Diagnosis Date  . Acquired thrombophilia ([place]) 01/15/2019  . Aortic atherosclerosis ([place])   . Atrial fibrillation with RVR ([place]) 10/29/2017  . CAP (community acquired pneumonia) 03/06/2016  . Cardiomyopathy ([place])    a. EF 40-45% in 04/2016, etiology not defined, managed medically.  . CHF exacerbation ([place]) 10/29/2017  . Chronic laryngitis 10/03/2016  . COPD GOLD II  08/02/2016   Quit smoking 2000  PFT's  07/10/2016  FEV1 1.98 (70 % ) ratio 67  p 19 % improvement from saba p nothing  prior to study while of coreg so rec as of 08/02/2016 try off coreg and on  bisoprolol     . Dysphonia 10/03/2016  . Essential hypertension 03/06/2016   Changed from coreg to bisoprolol due to copd with reversible component  08/02/2016 >>>   . Fever 03/12/2015  . History of cardioversion   . Hoarseness 08/31/2016   Referred to ent 08/31/2016 >>> seen 10/03/16 [name] dx gerd and rhinitis medicamentosa   > improved on f/u 01/31/17 on gerd rx/ flonase and off afrin  . Hyperlipidemia   . Hypertension   . Laryngopharyngeal reflux (LPR) 10/03/2016  . Moderate persistent asthma 08/02/2016  . Morbid (severe) obesity due to excess calories ([place]) 08/02/2016  . Myocardial infarction [facility]) 1980-early 2000s X 2   "mild ones" (03/06/2016)  . Nasal polyps 10/03/2016  . OSA on CPAP    a. pt states was told by Dr. Maxwell Caul that he would not require CPAP as long as he continued to sleep in recliner which he does for his back.  . Paroxysmal atrial fibrillation ([place])   . Pneumonia    "when I was a kid" (03/06/2016)  . Prolonged QT interval 10/29/2017  . RBBB   . 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 Rhinitis medicamentosa 10/03/2016  . Right thyroid nodule 03/06/2016  . Seizures ([place])    "take RX daily" (03/06/2016)  . Sensorineural hearing loss (SNHL) of both ears 01/31/2017    Past Surgical History:  Procedure Laterality Date  . CARDIOVERSION N/A 12/13/2017   Procedure: CARDIOVERSION;  Surgeon: [name], MD;  Location: [facility];  Service: Cardiovascular;  Laterality: N/A;  . CARDIOVERSION N/A 09/25/2018   Procedure: CARDIOVERSION;  Surgeon: [name], MD;  Location: [facility] ENDOSCOPY;  Service: Cardiovascular;  Laterality: N/A;  . CARDIOVERSION N/A 11/04/2018   Procedure: CARDIOVERSION;  Surgeon: [name], MD;  Location: [facility] ENDOSCOPY;  Service: Cardiovascular;  Laterality: N/A;  . CARDIOVERSION N/A 04/11/2019   Procedure: CARDIOVERSION;  Surgeon: [name], MD;  Location: [facility] ENDOSCOPY;  Service: Cardiovascular;  Laterality: N/A;  . CIRCUMCISION    . JOINT  REPLACEMENT    . PERCUTANEOUS PINNING TOE FRACTURE Left    "big toe  . REPLACEMENT TOTAL KNEE Left   . TONSILLECTOMY AND ADENOIDECTOMY       reports that he quit smoking about 21 years ago. His smoking use included cigarettes. He has a 144.00 pack-year smoking history. He has never used smokeless tobacco. He reports that he does not drink alcohol and does not use drugs.  Allergies  Allergen Reactions  . Dilaudid [Hydromorphone Hcl] Other (See Comments)    Makes pt hyper   . Dilaudid [Hydromorphone] Other (See Comments)    hyperactiviity  . Tegretol [Carbamazepine] Hives  . Tegretol [Carbamazepine] Hives    Family History  Problem Relation Age of Onset  . Hypertension Mother   . Other Father        sudden cardiac arrest    Prior to Admission medications   Medication Sig Start Date End Date Taking? Authorizing Provider  acetaminophen (TYLENOL) 500 MG tablet Take 500-1,000 mg by mouth daily as needed for moderate pain.     [provider]  amiodarone (PACERONE) 200 MG tablet Take 1 tablet (200 mg total) by mouth daily. 05/13/19   Camnitz, Ocie Doyne, MD  amiodarone (PACERONE) 200 MG tablet Take 200 mg by mouth daily with supper. 05/15/19   [provider]  ARTIFICIAL TEAR SOLUTION OP Place 1 drop into both eyes 2 (two) times daily.    [provider]  famotidine (PEPCID) 20 MG tablet One at bedtime 08/02/16   Tanda Rockers, MD  famotidine (PEPCID) 20 MG tablet Take 20 mg by mouth at bedtime. 03/05/19   [provider]  finasteride (PROSCAR) 5 MG tablet Take 5 mg by mouth daily at 12 noon.     [provider]  finasteride (PROSCAR) 5 MG tablet Take 5 mg by mouth daily with lunch. 07/28/19   [provider]  fluticasone (FLONASE) 50 MCG/ACT nasal spray Place 2 sprays into both nostrils 2 (two) times daily. 10/24/17   [provider]  fluticasone (FLONASE) 50 MCG/ACT nasal spray Place 1 spray into both nostrils 2 (two) times  daily. 07/02/19   [provider]  furosemide (LASIX) 40 MG tablet Take 1 tablet (40 mg total) by mouth daily. 09/27/18   Isaiah Serge, NP  furosemide (LASIX) 40 MG tablet Take 40 mg by mouth daily with lunch. 07/17/19   [provider]  loratadine (CLARITIN) 10 MG tablet Take 10 mg by mouth daily at 12 noon.     [provider]  loratadine (CLARITIN) 10 MG tablet Take 10 mg by mouth daily with lunch.    [provider]  losartan (COZAAR) 50 MG tablet TAKE 1 TABLET BY MOUTH ONCE DAILY TO  REPLACE  ENTRESTO 08/13/19   [name], MD  losartan (COZAAR) 50 MG tablet Take 50 mg by mouth daily with lunch. 08/13/19   [provider]  metoprolol succinate (TOPROL-XL) 100 MG 24 hr tablet Take 1 tablet (100 mg total) by mouth at bedtime. Take with or immediately following a meal. 05/13/19   Camnitz, Ocie Doyne, MD  metoprolol succinate (TOPROL-XL) 100 MG 24 hr tablet Take 100 mg by mouth daily with supper. 05/13/19   [provider]  Multiple Vitamin (MULTIVITAMIN WITH MINERALS) TABS tablet Take 1 tablet by mouth daily at 12 noon.    [provider]  Multiple Vitamin (MULTIVITAMIN WITH MINERALS) TABS tablet Take 1 tablet by mouth daily with lunch.    [provider]  PHENobarbital (LUMINAL) 97.2 MG tablet Take 97.2 mg by mouth at bedtime.     [provider]  PHENobarbital (LUMINAL) 97.2 MG tablet Take 97.2 mg by mouth at bedtime. 07/14/19   [provider]  polyvinyl alcohol (ARTIFICIAL TEARS) 1.4 % ophthalmic solution Place 1 drop into both eyes daily with lunch.    [provider]  potassium chloride (KLOR-CON) 10 MEQ tablet Take 10 mEq by mouth daily with lunch. 08/01/19   [provider]  potassium chloride SA (KLOR-CON) 20 MEQ tablet Take 10 mEq by mouth daily at 12 noon.    [provider]  pravastatin (PRAVACHOL) 40 MG tablet Take 40 mg by mouth at bedtime.     [provider]    pravastatin (PRAVACHOL) 40 MG tablet Take 40 mg by mouth at bedtime. 07/11/19   [provider]  warfarin (COUMADIN) 3 MG tablet Take 3 mg by mouth daily.  07/31/18   [provider]  warfarin (COUMADIN) 3 MG tablet Take 3 mg by mouth daily with lunch.    [provider]    Physical Exam: Constitutional: Moderately built and nourished. Vitals:   11/03/19 1838 11/03/19 1915 11/03/19 2000 11/03/19 2100  BP:  105/64 110/74 112/82  Pulse:  85 88 70  Resp:  _0 Temp:    98.4 F (36.9 C)  TempSrc:    Oral  SpO2:  91% 92% 93%  Weight: 117.9 kg     Height: 5' 8" (1.727 m)      Eyes: Anicteric no pallor. ENMT: No discharge from the ears eyes nose or mouth. Neck: No mass felt.  No neck rigidity.  No JVD appreciated. Respiratory: No rhonchi or crepitations. Cardiovascular: S1-S2 heard. Abdomen: Soft nontender bowel sounds present. Musculoskeletal: No edema. Skin: No rash. Neurologic: Alert awake oriented to time place and person.  Moves all extremities. Psychiatric: Appears normal.  Normal affect.   Labs on Admission: I have personally reviewed following labs and imaging studies  CBC: Recent Labs  Lab 11/03/19 1855  WBC 10.5  NEUTROABS 8.8*  HGB 13.8  HCT 44.6  MCV 98.2  PLT 051   Basic Metabolic Panel: Recent Labs  Lab 11/03/19 1855  NA 143  K 4.3  CL 106  CO2 25  GLUCOSE 131*  BUN 21  CREATININE 1.51*  CALCIUM 8.6*   GFR: Estimated Creatinine Clearance: 51.9 mL/min (A) (by C-G formula based on SCr of 1.51 mg/dL (H)). Liver Function Tests: Recent Labs  Lab 11/03/19 1855  AST 35  ALT 28  ALKPHOS 113  BILITOT 0.7  PROT 6.4*  ALBUMIN 3.4*   No results for input(s): LIPASE, AMYLASE in the last 168 hours. No results for input(s): AMMONIA in the last 168 hours. Coagulation Profile: Recent Labs  Lab 11/03/19 1855  INR 2.7*   Cardiac Enzymes: No results for input(s): CKTOTAL, CKMB, CKMBINDEX, TROPONINI in the last  168  hours. BNP (last 3 results) No results for input(s): PROBNP in the last 8760 hours. HbA1C: No results for input(s): HGBA1C in the last 72 hours. CBG: No results for input(s): GLUCAP in the last 168 hours. Lipid Profile: No results for input(s): CHOL, HDL, LDLCALC, TRIG, CHOLHDL, LDLDIRECT in the last 72 hours. Thyroid Function Tests: No results for input(s): TSH, T4TOTAL, FREET4, T3FREE, THYROIDAB in the last 72 hours. Anemia Panel: No results for input(s): VITAMINB12, FOLATE, FERRITIN, TIBC, IRON, RETICCTPCT in the last 72 hours. Urine analysis:    Component Value Date/Time   COLORURINE STRAW (A) 11/01/2017 1934   APPEARANCEUR CLEAR 11/01/2017 1934   LABSPEC 1.005 11/01/2017 1934   PHURINE 7.0 11/01/2017 1934   GLUCOSEU NEGATIVE 11/01/2017 1934   HGBUR NEGATIVE 11/01/2017 1934   BILIRUBINUR NEGATIVE 11/01/2017 1934   KETONESUR NEGATIVE 11/01/2017 1934   PROTEINUR NEGATIVE 11/01/2017 1934   UROBILINOGEN 0.2 05/06/2010 1010   NITRITE NEGATIVE 11/01/2017 1934   LEUKOCYTESUR NEGATIVE 11/01/2017 1934   Sepsis Labs: _0 (procalcitonin:4,lacticidven:4) )No results found for this or any previous visit (from the past 240 hour(s)).   Radiological Exams on Admission: DG Chest Port 1 View  Result Date: 11/03/2019 CLINICAL DATA:  Arrhythmia with history of COVID positive. EXAM: PORTABLE CHEST 1 VIEW COMPARISON:  August 31, 2019 FINDINGS: A very mild amount of atelectasis is seen within the left lung base. There is no evidence of a pleural effusion or pneumothorax. The cardiac silhouette is mildly enlarged. The visualized skeletal structures are unremarkable. IMPRESSION: 1. Very mild amount of left basilar atelectasis. 2. Mild cardiomegaly. Electronically Signed   By: Virgina Norfolk M.D.   On: 11/03/2019 19:22    EKG: Independently reviewed.  A flutter with RVR.  Assessment/Plan Principal Problem:   Atrial flutter with rapid ventricular response (HCC) Active Problems:    Essential hypertension   Seizures (HCC)   Moderate persistent asthma   COPD GOLD II    Morbid (severe) obesity due to excess calories (HCC)   Acquired thrombophilia ([place])   Atrial flutter ([place])    1. A flutter with RVR with previous history of A. fib presently on Cardizem drip.  Will be starting heparin once INR is subtherapeutic.  Check TSH cardiac markers.  On beta-blocker. 2. Elevated troponin likely precipitated by elevated heart rate from a flutter.  Denies any chest pain.  Cardiology has been notified.  We will keep patient n.p.o. past midnight.  Patient will be on heparin once INR becomes subtherapeutic.  Continue with beta-blockers and statins. 3. Recent Covid test was positive about 11 days ago when patient had been on quarantine for 10 days and just came out of the current time.  Presently appears asymptomatic.  Patient does not have any chest x-ray infiltrate is not hypoxic and does not have a fever.  Continue to monitor. 4. Nonischemic cardiomyopathy appears compensated. 5. History of seizures on phenobarbital.   DVT prophylaxis: Heparin infusion. Code Status: Full code. Family Communication: Discussed with patient. Disposition Plan: Home. Consults called: Cardiology. Admission status: Observation.   Rise Patience MD [facility] Pager [phone].  If 7PM-7AM, please contact night-coverage [email or web address] Password [email or web address]  11/03/2019, 11:46 PM

## 2019-11-03 NOTE — ED Notes (Signed)
Dr Sabra Heck aware Troponin 539

## 2019-11-04 ENCOUNTER — Observation Stay (HOSPITAL_COMMUNITY): Payer: Medicare HMO

## 2019-11-04 DIAGNOSIS — I340 Nonrheumatic mitral (valve) insufficiency: Secondary | ICD-10-CM

## 2019-11-04 DIAGNOSIS — Z7901 Long term (current) use of anticoagulants: Secondary | ICD-10-CM | POA: Diagnosis not present

## 2019-11-04 DIAGNOSIS — I700 Atherosclerosis of aorta: Secondary | ICD-10-CM | POA: Diagnosis present

## 2019-11-04 DIAGNOSIS — I214 Non-ST elevation (NSTEMI) myocardial infarction: Secondary | ICD-10-CM | POA: Diagnosis present

## 2019-11-04 DIAGNOSIS — I420 Dilated cardiomyopathy: Secondary | ICD-10-CM | POA: Diagnosis present

## 2019-11-04 DIAGNOSIS — Z6839 Body mass index (BMI) 39.0-39.9, adult: Secondary | ICD-10-CM | POA: Diagnosis not present

## 2019-11-04 DIAGNOSIS — D6859 Other primary thrombophilia: Secondary | ICD-10-CM | POA: Diagnosis present

## 2019-11-04 DIAGNOSIS — E785 Hyperlipidemia, unspecified: Secondary | ICD-10-CM | POA: Diagnosis present

## 2019-11-04 DIAGNOSIS — I351 Nonrheumatic aortic (valve) insufficiency: Secondary | ICD-10-CM

## 2019-11-04 DIAGNOSIS — I272 Pulmonary hypertension, unspecified: Secondary | ICD-10-CM | POA: Diagnosis present

## 2019-11-04 DIAGNOSIS — I480 Paroxysmal atrial fibrillation: Secondary | ICD-10-CM | POA: Diagnosis present

## 2019-11-04 DIAGNOSIS — I4892 Unspecified atrial flutter: Secondary | ICD-10-CM

## 2019-11-04 DIAGNOSIS — N1831 Chronic kidney disease, stage 3a: Secondary | ICD-10-CM | POA: Diagnosis present

## 2019-11-04 DIAGNOSIS — I5022 Chronic systolic (congestive) heart failure: Secondary | ICD-10-CM | POA: Diagnosis present

## 2019-11-04 DIAGNOSIS — Z888 Allergy status to other drugs, medicaments and biological substances status: Secondary | ICD-10-CM | POA: Diagnosis not present

## 2019-11-04 DIAGNOSIS — G4733 Obstructive sleep apnea (adult) (pediatric): Secondary | ICD-10-CM | POA: Diagnosis present

## 2019-11-04 DIAGNOSIS — I361 Nonrheumatic tricuspid (valve) insufficiency: Secondary | ICD-10-CM

## 2019-11-04 DIAGNOSIS — J4540 Moderate persistent asthma, uncomplicated: Secondary | ICD-10-CM | POA: Diagnosis present

## 2019-11-04 DIAGNOSIS — Z66 Do not resuscitate: Secondary | ICD-10-CM | POA: Diagnosis present

## 2019-11-04 DIAGNOSIS — J449 Chronic obstructive pulmonary disease, unspecified: Secondary | ICD-10-CM | POA: Diagnosis present

## 2019-11-04 DIAGNOSIS — N179 Acute kidney failure, unspecified: Secondary | ICD-10-CM | POA: Diagnosis present

## 2019-11-04 DIAGNOSIS — I130 Hypertensive heart and chronic kidney disease with heart failure and stage 1 through stage 4 chronic kidney disease, or unspecified chronic kidney disease: Secondary | ICD-10-CM | POA: Diagnosis present

## 2019-11-04 DIAGNOSIS — Z87891 Personal history of nicotine dependence: Secondary | ICD-10-CM | POA: Diagnosis not present

## 2019-11-04 DIAGNOSIS — R569 Unspecified convulsions: Secondary | ICD-10-CM | POA: Diagnosis present

## 2019-11-04 DIAGNOSIS — I252 Old myocardial infarction: Secondary | ICD-10-CM | POA: Diagnosis not present

## 2019-11-04 DIAGNOSIS — Z8616 Personal history of COVID-19: Secondary | ICD-10-CM | POA: Diagnosis not present

## 2019-11-04 LAB — LIPID PANEL
Cholesterol: 114 mg/dL (ref 0–200)
HDL: 34 mg/dL — ABNORMAL LOW (ref >40)
LDL Cholesterol: 68 mg/dL (ref 0–99)
Total CHOL/HDL Ratio: 3.4 RATIO
Triglycerides: 60 mg/dL (ref <150)
VLDL: 12 mg/dL (ref 0–40)

## 2019-11-04 LAB — BASIC METABOLIC PANEL
Anion gap: 9 (ref 5–15)
BUN: 20 mg/dL (ref 8–23)
CO2: 27 mmol/L (ref 22–32)
Calcium: 8.4 mg/dL — ABNORMAL LOW (ref 8.9–10.3)
Chloride: 104 mmol/L (ref 98–111)
Creatinine, Ser: 1.23 mg/dL (ref 0.61–1.24)
GFR calc Af Amer: >60 mL/min (ref >60)
GFR calc non Af Amer: 57 mL/min — ABNORMAL LOW (ref >60)
Glucose, Bld: 106 mg/dL — ABNORMAL HIGH (ref 70–99)
Potassium: 4.6 mmol/L (ref 3.5–5.1)
Sodium: 140 mmol/L (ref 135–145)

## 2019-11-04 LAB — ECHOCARDIOGRAM COMPLETE
Area-P 1/2: 4.31 cm2
Height: 68 in
P 1/2 time: 855 msec
S' Lateral: 4.9 cm
Weight: 4080 oz

## 2019-11-04 LAB — CBC
HCT: 42.3 % (ref 39.0–52.0)
Hemoglobin: 13.3 g/dL (ref 13.0–17.0)
MCH: 30.7 pg (ref 26.0–34.0)
MCHC: 31.4 g/dL (ref 30.0–36.0)
MCV: 97.7 fL (ref 80.0–100.0)
Platelets: 203 10*3/uL (ref 150–400)
RBC: 4.33 MIL/uL (ref 4.22–5.81)
RDW: 14 % (ref 11.5–15.5)
WBC: 10.2 10*3/uL (ref 4.0–10.5)
nRBC: 0 % (ref 0.0–0.2)

## 2019-11-04 LAB — PROTIME-INR
INR: 3.3 — ABNORMAL HIGH (ref 0.8–1.2)
Prothrombin Time: 32.9 seconds — ABNORMAL HIGH (ref 11.4–15.2)

## 2019-11-04 LAB — TROPONIN I (HIGH SENSITIVITY)
Troponin I (High Sensitivity): 3943 ng/L (ref <18)
Troponin I (High Sensitivity): 4176 ng/L (ref <18)
Troponin I (High Sensitivity): 3759 ng/L (ref <18)

## 2019-11-04 LAB — TSH: TSH: 0.642 u[IU]/mL (ref 0.350–4.500)

## 2019-11-04 MED ORDER — ASPIRIN EC 81 MG PO TBEC
81.0000 mg | DELAYED_RELEASE_TABLET | Freq: Every day | ORAL | Status: DC
  Administered 2019-11-04 – 2019-11-05 (×2): 81 mg via ORAL
  Filled 2019-11-04 (×2): qty 1

## 2019-11-04 MED ORDER — PHYTONADIONE 5 MG PO TABS
5.0000 mg | ORAL_TABLET | Freq: Once | ORAL | Status: AC
  Administered 2019-11-04: 5 mg via ORAL
  Filled 2019-11-04: qty 1

## 2019-11-04 MED ORDER — PERFLUTREN LIPID MICROSPHERE
1.0000 mL | INTRAVENOUS | Status: AC | PRN
  Administered 2019-11-04: 2 mL via INTRAVENOUS
  Filled 2019-11-04: qty 10

## 2019-11-04 NOTE — Progress Notes (Signed)
  Echocardiogram 2D Echocardiogram has been performed.  Matilde Bash 11/04/2019, 2:57 PM

## 2019-11-04 NOTE — Progress Notes (Signed)
PROGRESS NOTE    Ruben Murray  OJZ:[id number] DOB: 12-15-1945 DOA: 11/03/2019 PCP: [name], MD     Brief Narrative:  Ruben Murray is a 74 y.o. male with history of nonischemic cardiomyopathy last EF measured in January 2020 was 22 to 45% with history of atrial fibrillation, multiple cardioversion, presently on Coumadin and metoprolol, history of seizures, recently diagnosed with COVID-19 infection has been in quarantine for 10 days. He was out of quarantine now for the last 24 hours and was on his way to the [place] when he felt not right and turned back to his house.  On reaching his house, patient was walking and felt very dizzy and almost passed out.  EMS was called patient was found to be tachycardic and was brought to the ER. In the ER, patient was in atrial flutter with RVR was started on Cardizem infusion.  Chest x-ray was unremarkable patient was not hypoxic or febrile.  Labs are significant for BNP of 224 and high sensitive troponin of 539.  Creatinine 1.5 and INR of 2.7.  Cardiology on-call was consulted and patient admitted for atrial flutter with RVR and high sensitive troponin.  New events last 24 hours / Subjective: Patient remains on Cardizem drip, has converted to normal sinus rhythm.  Denies any chest pain or shortness of breath on examination.  Assessment & Plan:   Principal Problem:   Atrial flutter with rapid ventricular response (HCC) Active Problems:   Essential hypertension   Seizures (HCC)   Moderate persistent asthma   COPD GOLD II    Morbid (severe) obesity due to excess calories (HCC)   Acquired thrombophilia (HCC)   Atrial flutter (HCC)   Atrial flutter with RVR -Now converted to normal sinus rhythm -Cardizem drip, wean -Continue Toprol -IV heparin  NSTEMI Nonischemic cardiomyopathy -Troponin peaked at 4176 -Echocardiogram pending -Cardiology planning for heart cath 9/1 -Aspirin  Recent Covid 19 infection -Tested positive on 8/20 -Now  asymptomatic and completed home quarantine for 10 days  History of seizures -Continue phenobarbital  AKI on CKD stage IIIa -Resolved  Essential hypertension -Continue Cozaar, Toprol  Hyperlipidemia -Continue Pravachol   DVT prophylaxis: IV heparin once INR subtherapeutic, takes Coumadin at home   Code Status: DNR Family Communication: No family at bedside Disposition Plan:   Status is: Inpatient  Remains inpatient appropriate because:IV treatments appropriate due to intensity of illness or inability to take PO and Inpatient level of care appropriate due to severity of illness   Dispo: The patient is from: Home              Anticipated d/c is to: Home              Anticipated d/c date is: 2 days              Patient currently is not medically stable to d/c.  Remains on Cardizem drip, IV heparin drip.  Cardiology planning for heart cath 9/1    Consultants:   Cardiology  Procedures:   None  Antimicrobials:  Anti-infectives (From admission, onward)   None        Objective: Vitals:   11/04/19 0900 11/04/19 0930 11/04/19 1000 11/04/19 1125  BP: 117/83 111/74 114/78 102/73  Pulse:  64  82  Resp:    17  Temp:    97.8 F (36.6 C)  TempSrc:    Oral  SpO2:  93%  98%  Weight:    115.7 kg  Height:  No intake or output data in the 24 hours ending 11/04/19 1227 Filed Weights   11/03/19 1838 11/04/19 1125  Weight: 117.9 kg 115.7 kg    Examination:  General exam: Appears calm and comfortable  Respiratory system: Clear to auscultation. Respiratory effort normal. No respiratory distress. No conversational dyspnea.  Cardiovascular system: S1 & S2 heard, RRR. No murmurs. No pedal edema. Gastrointestinal system: Abdomen is nondistended, soft and nontender. Normal bowel sounds heard. Central nervous system: Alert and oriented. No focal neurological deficits. Speech clear.  Extremities: Symmetric in appearance  Skin: No rashes, lesions or ulcers on exposed  skin  Psychiatry: Judgement and insight appear normal. Mood & affect appropriate.   Data Reviewed: I have personally reviewed following labs and imaging studies  CBC: Recent Labs  Lab 11/03/19 1855 11/04/19 0221  WBC 10.5 10.2  NEUTROABS 8.8*  --   HGB 13.8 13.3  HCT 44.6 42.3  MCV 98.2 97.7  PLT 198 003   Basic Metabolic Panel: Recent Labs  Lab 11/03/19 1855 11/04/19 0221  NA 143 140  K 4.3 4.6  CL 106 104  CO2 25 27  GLUCOSE 131* 106*  BUN 21 20  CREATININE 1.51* 1.23  CALCIUM 8.6* 8.4*   GFR: Estimated Creatinine Clearance: 63.1 mL/min (by C-G formula based on SCr of 1.23 mg/dL). Liver Function Tests: Recent Labs  Lab 11/03/19 1855  AST 35  ALT 28  ALKPHOS 113  BILITOT 0.7  PROT 6.4*  ALBUMIN 3.4*   No results for input(s): LIPASE, AMYLASE in the last 168 hours. No results for input(s): AMMONIA in the last 168 hours. Coagulation Profile: Recent Labs  Lab 11/03/19 1855  INR 2.7*   Cardiac Enzymes: No results for input(s): CKTOTAL, CKMB, CKMBINDEX, TROPONINI in the last 168 hours. BNP (last 3 results) No results for input(s): PROBNP in the last 8760 hours. HbA1C: No results for input(s): HGBA1C in the last 72 hours. CBG: No results for input(s): GLUCAP in the last 168 hours. Lipid Profile: Recent Labs    11/04/19 0214  CHOL 114  HDL 34*  LDLCALC 68  TRIG 60  CHOLHDL 3.4   Thyroid Function Tests: Recent Labs    11/03/19 2345  TSH 0.642   Anemia Panel: No results for input(s): VITAMINB12, FOLATE, FERRITIN, TIBC, IRON, RETICCTPCT in the last 72 hours. Sepsis Labs: No results for input(s): PROCALCITON, LATICACIDVEN in the last 168 hours.  No results found for this or any previous visit (from the past 240 hour(s)).    Radiology Studies: DG Chest Port 1 View  Result Date: 11/03/2019 CLINICAL DATA:  Arrhythmia with history of COVID positive. EXAM: PORTABLE CHEST 1 VIEW COMPARISON:  August 31, 2019 FINDINGS: A very mild amount of  atelectasis is seen within the left lung base. There is no evidence of a pleural effusion or pneumothorax. The cardiac silhouette is mildly enlarged. The visualized skeletal structures are unremarkable. IMPRESSION: 1. Very mild amount of left basilar atelectasis. 2. Mild cardiomegaly. Electronically Signed   By: Virgina Norfolk M.D.   On: 11/03/2019 19:22      Scheduled Meds: . aspirin EC  81 mg Oral Daily  . famotidine  20 mg Oral BID  . finasteride  5 mg Oral Q1200  . fluticasone  2 spray Each Nare BID  . loratadine  10 mg Oral Q1200  . losartan  50 mg Oral Daily  . metoprolol succinate  100 mg Oral QHS  . multivitamin with minerals  1 tablet Oral Q1200  .  PHENobarbital  97.2 mg Oral QHS  . phenobarbital  97.2 mg Oral Once  . phytonadione  5 mg Oral Once  . pravastatin  40 mg Oral QHS   Continuous Infusions: . diltiazem (CARDIZEM) infusion 5 mg/hr (11/03/19 1913)     LOS: 0 days      Time spent: 40 minutes   Dessa Phi, DO [facility] 11/04/2019, 12:27 PM   Available via Epic secure chat 7am-7pm After these hours, please refer to coverage provider listed on [email or web address]

## 2019-11-04 NOTE — Plan of Care (Signed)
  Problem: Clinical Measurements: Goal: Will remain free from infection Outcome: Progressing   Problem: Activity: Goal: Risk for activity intolerance will decrease Outcome: Progressing

## 2019-11-04 NOTE — Progress Notes (Signed)
ANTICOAGULATION CONSULT NOTE - Initial Consult  Pharmacy Consult for Heparin Indication: atrial fibrillation  Allergies  Allergen Reactions  . Dilaudid [Hydromorphone Hcl] Other (See Comments)    Makes pt hyper   . Dilaudid [Hydromorphone] Other (See Comments)    hyperactiviity  . Tegretol [Carbamazepine] Hives  . Tegretol [Carbamazepine] Hives    Patient Measurements: Height: _0  (172.7 cm) Weight: 117.9 kg (260 lb) IBW/kg (Calculated) : 68.4 Heparin Dosing Weight: 95 kg  Vital Signs: Temp: 98.4 F (36.9 C) (08/30 2100) Temp Source: Oral (08/30 2100) BP: 119/78 (08/31 0430) Pulse Rate: 65 (08/31 0430)  Labs: Recent Labs    11/03/19 1855 11/03/19 2346 11/04/19 0221  HGB 13.8  --  13.3  HCT 44.6  --  42.3  PLT 198  --  203  LABPROT 27.6*  --   --   INR 2.7*  --   --   CREATININE 1.51*  --  1.23  TROPONINIHS 539* 3,943* 4,176*    Estimated Creatinine Clearance: 63.7 mL/min (by C-G formula based on SCr of 1.23 mg/dL).   Medical History: Past Medical History:  Diagnosis Date  . Acquired thrombophilia ([place]) 01/15/2019  . Aortic atherosclerosis ([place])   . Atrial fibrillation with RVR ([place]) 10/29/2017  . CAP (community acquired pneumonia) 03/06/2016  . Cardiomyopathy ([place])    a. EF 40-45% in 04/2016, etiology not defined, managed medically.  . CHF exacerbation ([place]) 10/29/2017  . Chronic laryngitis 10/03/2016  . COPD GOLD II  08/02/2016   Quit smoking 2000  PFT's  07/10/2016  FEV1 1.98 (70 % ) ratio 67  p 19 % improvement from saba p nothing  prior to study while of coreg so rec as of 08/02/2016 try off coreg and on bisoprolol     . Dysphonia 10/03/2016  . Essential hypertension 03/06/2016   Changed from coreg to bisoprolol due to copd with reversible component  08/02/2016 >>>   . Fever 03/12/2015  . History of cardioversion   . Hoarseness 08/31/2016   Referred to ent 08/31/2016 >>> seen 10/03/16 [name] dx gerd and rhinitis medicamentosa   > improved on f/u 01/31/17 on gerd  rx/ flonase and off afrin  . Hyperlipidemia   . Hypertension   . Laryngopharyngeal reflux (LPR) 10/03/2016  . Moderate persistent asthma 08/02/2016  . Morbid (severe) obesity due to excess calories ([place]) 08/02/2016  . Myocardial infarction [facility]) 1980-early 2000s X 2   "mild ones" (03/06/2016)  . Nasal polyps 10/03/2016  . OSA on CPAP    a. pt states was told by Dr. Maxwell Caul that he would not require CPAP as long as he continued to sleep in recliner which he does for his back.  . Paroxysmal atrial fibrillation ([place])   . Pneumonia    "when I was a kid" (03/06/2016)  . Prolonged QT interval 10/29/2017  . RBBB   . 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 Rhinitis medicamentosa 10/03/2016  . Right thyroid nodule 03/06/2016  . Seizures ([place])    "take RX daily" (03/06/2016)  . Sensorineural hearing loss (SNHL) of both ears 01/31/2017    Medications:  No current facility-administered medications on file prior to encounter.   Current Outpatient Medications on File Prior to Encounter  Medication Sig Dispense Refill  . acetaminophen (TYLENOL) 500 MG tablet Take 500-1,000 mg by mouth daily as needed for moderate pain.     Marland Kitchen amiodarone (PACERONE) 200 MG tablet Take 1 tablet (200 mg total) by mouth daily. 90 tablet 1  . ARTIFICIAL  TEAR SOLUTION OP Place 1 drop into both eyes 2 (two) times daily.    . famotidine (PEPCID) 20 MG tablet One at bedtime (Patient taking differently: Take 20 mg by mouth at bedtime. One at bedtime) 30 tablet 2  . finasteride (PROSCAR) 5 MG tablet Take 5 mg by mouth daily at 12 noon.     . fluticasone (FLONASE) 50 MCG/ACT nasal spray Place 2 sprays into both nostrils 2 (two) times daily.    Marland Kitchen loratadine (CLARITIN) 10 MG tablet Take 10 mg by mouth daily at 12 noon.     Marland Kitchen losartan (COZAAR) 50 MG tablet Take 50 mg by mouth daily with lunch.    . metoprolol succinate (TOPROL-XL) 100 MG 24 hr tablet Take 100 mg by mouth daily.     . Multiple Vitamin (MULTIVITAMIN WITH MINERALS) TABS  tablet Take 1 tablet by mouth daily at 12 noon.    Marland Kitchen PHENobarbital (LUMINAL) 97.2 MG tablet Take 97.2 mg by mouth at bedtime.     . polyvinyl alcohol (ARTIFICIAL TEARS) 1.4 % ophthalmic solution Place 1 drop into both eyes daily with lunch.    . potassium chloride (KLOR-CON) 10 MEQ tablet Take 10 mEq by mouth daily with lunch.    . pravastatin (PRAVACHOL) 40 MG tablet Take 40 mg by mouth at bedtime.     Marland Kitchen warfarin (COUMADIN) 3 MG tablet Take 3 mg by mouth daily.     . furosemide (LASIX) 40 MG tablet Take 1 tablet (40 mg total) by mouth daily. (Patient not taking: Reported on 11/04/2019) 90 tablet 3  . [DISCONTINUED] metoprolol succinate (TOPROL-XL) 100 MG 24 hr tablet Take 1 tablet (100 mg total) by mouth at bedtime. Take with or immediately following a meal. 90 tablet 1  . [DISCONTINUED] Multiple Vitamin (MULTIVITAMIN WITH MINERALS) TABS tablet Take 1 tablet by mouth daily with lunch.    . [DISCONTINUED] PHENobarbital (LUMINAL) 97.2 MG tablet Take 97.2 mg by mouth at bedtime.    . [DISCONTINUED] potassium chloride SA (KLOR-CON) 20 MEQ tablet Take 10 mEq by mouth daily at 12 noon.    . [DISCONTINUED] pravastatin (PRAVACHOL) 40 MG tablet Take 40 mg by mouth at bedtime.    . [DISCONTINUED] warfarin (COUMADIN) 3 MG tablet Take 3 mg by mouth daily with lunch.       Assessment: 74 y.o. male admitted with atrial flutter, h/o Afib and Coumadin on hold, for heparin once INR < 2.  INR 2.7 on admission.  Goal of Therapy:  Heparin level 0.3-0.7 units/ml Monitor platelets by anticoagulation protocol: Yes   Plan:  F/U PT/INR  Latrise Bowland, Bronson Curb 11/04/2019,4:57 AM

## 2019-11-04 NOTE — Consult Note (Signed)
Cardiology Consultation:   Patient ID: Ruben Murray MRN: [id number]; DOB: 05/19/1945  Admit date: 11/03/2019 Date of Consult: 11/04/2019  Primary Care Provider: [name], MD [facility] Cardiologist: [name], MD  [facility] Electrophysiologist:  [name], MD    Patient Profile:   Ruben Murray is a 74 y.o. male with a hx of atrial fibrillation  who is being seen today for the evaluation of atrial flutter  at the request of Dr Sabra Heck  History of Present Illness:   Ruben Murray is a 74 yo with hx of systolic CHF (etiology unclear; echo in 2020 with LVEF 40 to 45%) mild heart attacks (1980, 2000s x 2, 2018  NO report of cath) HTN, sleep apnea, seizures,and atrial fibrillation (on coumadin), aortic atherosclerosis, COPD HTN, HL    He recently had COVID and received the monoclonal antibody for this   He finished quarantining yesterday  Today the pt was going to store   Didn't feel right   Turned around and drove home   Was walking in from parking lot when he developed N/  Weakness.  Collapsed in front yard.    911 called   When arrived tele showed wide complex tachycardia   He was given Cardiazem with slowing to 130s  Brought to ED  The pt denies SOB   Has some chronic edema Currently he is feeling much better    He has had a few spells of atrial fib in the past   This is the worst.  He is feeling much better now     Past Medical History:  Diagnosis Date  . Acquired thrombophilia ([facility]) 01/15/2019  . Aortic atherosclerosis ([place])   . Atrial fibrillation with RVR ([place]) 10/29/2017  . CAP (community acquired pneumonia) 03/06/2016  . Cardiomyopathy ([place])    a. EF 40-45% in 04/2016, etiology not defined, managed medically.  . CHF exacerbation ([place]) 10/29/2017  . Chronic laryngitis 10/03/2016  . COPD GOLD II  08/02/2016   Quit smoking 2000  PFT's  07/10/2016  FEV1 1.98 (70 % ) ratio 67  p 19 % improvement from saba p nothing  prior to study while of coreg so rec as  of 08/02/2016 try off coreg and on bisoprolol     . Dysphonia 10/03/2016  . Essential hypertension 03/06/2016   Changed from coreg to bisoprolol due to copd with reversible component  08/02/2016 >>>   . Fever 03/12/2015  . History of cardioversion   . Hoarseness 08/31/2016   Referred to ent 08/31/2016 >>> seen 10/03/16 [name] dx gerd and rhinitis medicamentosa   > improved on f/u 01/31/17 on gerd rx/ flonase and off afrin  . Hyperlipidemia   . Hypertension   . Laryngopharyngeal reflux (LPR) 10/03/2016  . Moderate persistent asthma 08/02/2016  . Morbid (severe) obesity due to excess calories ([place]) 08/02/2016  . Myocardial infarction [facility]) 1980-early 2000s X 2   "mild ones" (03/06/2016)  . Nasal polyps 10/03/2016  . OSA on CPAP    a. pt states was told by Dr. Maxwell Caul that he would not require CPAP as long as he continued to sleep in recliner which he does for his back.  . Paroxysmal atrial fibrillation ([place])   . Pneumonia    "when I was a kid" (03/06/2016)  . Prolonged QT interval 10/29/2017  . RBBB   . 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 Rhinitis medicamentosa 10/03/2016  . Right thyroid nodule 03/06/2016  . Seizures ([place])    "take  RX daily" (03/06/2016)  . Sensorineural hearing loss (SNHL) of both ears 01/31/2017    Past Surgical History:  Procedure Laterality Date  . CARDIOVERSION N/A 12/13/2017   Procedure: CARDIOVERSION;  Surgeon: [name], MD;  Location: [facility];  Service: Cardiovascular;  Laterality: N/A;  . CARDIOVERSION N/A 09/25/2018   Procedure: CARDIOVERSION;  Surgeon: [name], MD;  Location: [facility] ENDOSCOPY;  Service: Cardiovascular;  Laterality: N/A;  . CARDIOVERSION N/A 11/04/2018   Procedure: CARDIOVERSION;  Surgeon: [name], MD;  Location: [facility] ENDOSCOPY;  Service: Cardiovascular;  Laterality: N/A;  . CARDIOVERSION N/A 04/11/2019   Procedure: CARDIOVERSION;  Surgeon: [name], MD;  Location: [facility] ENDOSCOPY;  Service: Cardiovascular;  Laterality: N/A;    . CIRCUMCISION    . JOINT REPLACEMENT    . PERCUTANEOUS PINNING TOE FRACTURE Left    "big toe  . REPLACEMENT TOTAL KNEE Left   . TONSILLECTOMY AND ADENOIDECTOMY         Inpatient Medications: Scheduled Meds: . famotidine  20 mg Oral BID  . finasteride  5 mg Oral Q1200  . fluticasone  2 spray Each Nare BID  . loratadine  10 mg Oral Q1200  . losartan  50 mg Oral Daily  . metoprolol succinate  100 mg Oral QHS  . multivitamin with minerals  1 tablet Oral Q1200  . PHENobarbital  97.2 mg Oral QHS  . phenobarbital  97.2 mg Oral Once  . pravastatin  40 mg Oral QHS   Continuous Infusions: . diltiazem (CARDIZEM) infusion 5 mg/hr (11/03/19 1913)   PRN Meds: acetaminophen **OR** acetaminophen  Allergies:    Allergies  Allergen Reactions  . Dilaudid [Hydromorphone Hcl] Other (See Comments)    Makes pt hyper   . Dilaudid [Hydromorphone] Other (See Comments)    hyperactiviity  . Tegretol [Carbamazepine] Hives  . Tegretol [Carbamazepine] Hives    Social History:   Social History   Socioeconomic History  . Marital status: Married    Spouse name: Not on file  . Number of children: Not on file  . Years of education: Not on file  . Highest education level: Not on file  Occupational History  . Not on file  Tobacco Use  . Smoking status: Former Smoker    Packs/day: 3.00    Years: 48.00    Pack years: 144.00    Types: Cigarettes    Quit date: 2000    Years since quitting: 21.6  . Smokeless tobacco: Never Used  Vaping Use  . Vaping Use: Never used  Substance and Sexual Activity  . Alcohol use: Never  . Drug use: Never  . Sexual activity: Never  Other Topics Concern  . Not on file  Social History Narrative   ** Merged History Encounter **       Social Determinants of Health   Financial Resource Strain:   . Difficulty of Paying Living Expenses: Not on file  Food Insecurity: No Food Insecurity  . Worried About Charity fundraiser in the Last Year: Never true  .  Ran Out of Food in the Last Year: Never true  Transportation Needs: No Transportation Needs  . Lack of Transportation (Medical): No  . Lack of Transportation (Non-Medical): No  Physical Activity:   . Days of Exercise per Week: Not on file  . Minutes of Exercise per Session: Not on file  Stress:   . Feeling of Stress : Not on file  Social Connections:   . Frequency of Communication with Friends  and Family: Not on file  . Frequency of Social Gatherings with Friends and Family: Not on file  . Attends Religious Services: Not on file  . Active Member of Clubs or Organizations: Not on file  . Attends Archivist Meetings: Not on file  . Marital Status: Not on file  Intimate Partner Violence:   . Fear of Current or Ex-Partner: Not on file  . Emotionally Abused: Not on file  . Physically Abused: Not on file  . Sexually Abused: Not on file    Family History:    Family History  Problem Relation Age of Onset  . Hypertension Mother   . Other Father        sudden cardiac arrest     ROS:  Please see the history of present illness.   All other ROS reviewed and negative.     Physical Exam/Data:   Vitals:   11/03/19 2000 11/03/19 2100 11/04/19 0000 11/04/19 0030  BP: 110/74 112/82 119/66 113/61  Pulse: 88 70 82   Resp: 12 12    Temp:  98.4 F (36.9 C)    TempSrc:  Oral    SpO2: 92% 93% 95%   Weight:      Height:       No intake or output data in the 24 hours ending 11/04/19 0138 Last 3 Weights 11/03/2019 08/31/2019 05/13/2019  Weight (lbs) 260 lb 260 lb 273 lb 3.2 oz  Weight (kg) 117.935 kg 117.935 kg 123.923 kg     Body mass index is 39.53 kg/m.  General:  Morbdly obese 74 yo , in no acute distress HEENT: normal Lymph: no adenopathy Neck: no JVD Endocrine:  No thryomegaly Vascular: No carotid bruits; 2+ DP   Cardiac:  normal S1, S2; RRR; no murmur  Lungs:  clear to auscultation bilaterally, no wheezing, rhonchi or rales  Abd: soft, nontender, no hepatomegaly   Ext: no edema Musculoskeletal:  No deformities, BUE and BLE strength normal and equal Skin: warm and dry  Neuro:  CNs 2-12 intact, no focal abnormalities noted Psych:  Normal affect   EKG:  The EKG was personally reviewed and demonstrates:  Atrial flutter with 2:1 AVblock   RBBB  LAFB  130 bpm   Last EKG:  Atrial flutter 3:1 AV conduciton   RBBB, LAFB 91 bpm   Telemetry:  Telemetry was personally reviewed and demonstrates:  Atrial flutter with variable conduction    Relevant CV Studies: Echo 1.13/20   - Left ventricle: The cavity size was normal. Wall thickness was  normal. Systolic function was mildly to moderately reduced. The  estimated ejection fraction was in the range of 40% to 45%. Wall  motion was normal; there were no regional wall motion  abnormalities. Doppler parameters are consistent with abnormal  left ventricular relaxation (grade 1 diastolic dysfunction).  - Right ventricle: The cavity size was mildly dilated.  - Right atrium: The atrium was mildly dilated.  - Pulmonary arteries: Systolic pressure was mildly to moderately  increased. PA peak pressure: 40 mm Hg (S).   Laboratory Data:  High Sensitivity Troponin:   Recent Labs  Lab 11/03/19 1855 11/03/19 2346  TROPONINIHS 539* 3,943*     Chemistry Recent Labs  Lab 11/03/19 1855  NA 143  K 4.3  CL 106  CO2 25  GLUCOSE 131*  BUN 21  CREATININE 1.51*  CALCIUM 8.6*  GFRNONAA 44*  GFRAA 51*  ANIONGAP 12    Recent Labs  Lab 11/03/19 1855  PROT  6.4*  ALBUMIN 3.4*  AST 35  ALT 28  ALKPHOS 113  BILITOT 0.7   Hematology Recent Labs  Lab 11/03/19 1855  WBC 10.5  RBC 4.54  HGB 13.8  HCT 44.6  MCV 98.2  MCH 30.4  MCHC 30.9  RDW 14.0  PLT 198   BNP Recent Labs  Lab 11/03/19 1855  BNP 224.8*    DDimer No results for input(s): DDIMER in the last 168 hours.   Radiology/Studies:  DG Chest Port 1 View  Result Date: 11/03/2019 CLINICAL DATA:  Arrhythmia with history of  COVID positive. EXAM: PORTABLE CHEST 1 VIEW COMPARISON:  August 31, 2019 FINDINGS: A very mild amount of atelectasis is seen within the left lung base. There is no evidence of a pleural effusion or pneumothorax. The cardiac silhouette is mildly enlarged. The visualized skeletal structures are unremarkable. IMPRESSION: 1. Very mild amount of left basilar atelectasis. 2. Mild cardiomegaly. Electronically Signed   By: Virgina Norfolk M.D.   On: 11/03/2019 19:22     Assessment and Plan:   1  Atrial flutter  Currently rates improved   Hx of afib   He has been on anticoagulation   He is on coumadin    2  Elevated troponin  Initial troponin 539   Repeat 3943   Pt currently comfortable  Denies CP Feels good  He has a hx of cath in past   Will need to get details in AM   For now, he is on coumadin and INR is therapeutic    REcheck in AM   When drops below 2 start anticoagulation with heparin     Recheck  Troponin in am   WIll need some type of eval   Keep NPO after MN ( will need to confirm quarentine    3  Hx sleep apnea     4  HTN  Follow on current regimen  5  HL  Check lipis in AM    For questions or updates, please contact [facility] Please consult [email or web address] for contact info under    Signed, Dorris Carnes, MD  11/04/2019 1:38 AM

## 2019-11-04 NOTE — Progress Notes (Signed)
Progress Note  Patient Name: Ruben Murray Date of Encounter: 11/04/2019  Primary Cardiologist:   [name], MD   Subjective   No chest pain.  No palpitations.  Seen earlier this morning by Dr. Harrington Challenger.    Inpatient Medications    Scheduled Meds: . aspirin EC  81 mg Oral Daily  . famotidine  20 mg Oral BID  . finasteride  5 mg Oral Q1200  . fluticasone  2 spray Each Nare BID  . loratadine  10 mg Oral Q1200  . losartan  50 mg Oral Daily  . metoprolol succinate  100 mg Oral QHS  . multivitamin with minerals  1 tablet Oral Q1200  . PHENobarbital  97.2 mg Oral QHS  . phenobarbital  97.2 mg Oral Once  . pravastatin  40 mg Oral QHS   Continuous Infusions: . diltiazem (CARDIZEM) infusion 5 mg/hr (11/03/19 1913)   PRN Meds: acetaminophen **OR** acetaminophen   Vital Signs    Vitals:   11/04/19 0030 11/04/19 0430 11/04/19 0500 11/04/19 0712  BP: 113/61 119/78 119/78 (!) 132/103  Pulse:  65 65 66  Resp:    17  Temp:    97.9 F (36.6 C)  TempSrc:    Oral  SpO2:  91% 92% 95%  Weight:      Height:       No intake or output data in the 24 hours ending 11/04/19 0831 Filed Weights   11/03/19 1838  Weight: 117.9 kg    Telemetry    Atrial flutter with controlled ventricular rate - Personally Reviewed  ECG    NA - Personally Reviewed  Physical Exam   GEN: No acute distress.    Labs    Chemistry Recent Labs  Lab 11/03/19 1855 11/04/19 0221  NA 143 140  K 4.3 4.6  CL 106 104  CO2 25 27  GLUCOSE 131* 106*  BUN 21 20  CREATININE 1.51* 1.23  CALCIUM 8.6* 8.4*  PROT 6.4*  --   ALBUMIN 3.4*  --   AST 35  --   ALT 28  --   ALKPHOS 113  --   BILITOT 0.7  --   GFRNONAA 44* 57*  GFRAA 51* >60  ANIONGAP 12 9     Hematology Recent Labs  Lab 11/03/19 1855 11/04/19 0221  WBC 10.5 10.2  RBC 4.54 4.33  HGB 13.8 13.3  HCT 44.6 42.3  MCV 98.2 97.7  MCH 30.4 30.7  MCHC 30.9 31.4  RDW 14.0 14.0  PLT 198 203    Cardiac EnzymesNo results for  input(s): TROPONINI in the last 168 hours. No results for input(s): TROPIPOC in the last 168 hours.   BNP Recent Labs  Lab 11/03/19 1855  BNP 224.8*     DDimer No results for input(s): DDIMER in the last 168 hours.   Radiology    DG Chest Port 1 View  Result Date: 11/03/2019 CLINICAL DATA:  Arrhythmia with history of COVID positive. EXAM: PORTABLE CHEST 1 VIEW COMPARISON:  August 31, 2019 FINDINGS: A very mild amount of atelectasis is seen within the left lung base. There is no evidence of a pleural effusion or pneumothorax. The cardiac silhouette is mildly enlarged. The visualized skeletal structures are unremarkable. IMPRESSION: 1. Very mild amount of left basilar atelectasis. 2. Mild cardiomegaly. Electronically Signed   By: Virgina Norfolk M.D.   On: 11/03/2019 19:22    Cardiac Studies   ECHO PENDING   Patient Profile     74  y.o. male  with a hx of atrial fibrillation  who was seen earlier today for the evaluation of atrial flutter  at the request of Dr Sabra Heck.     Assessment & Plan    CARDIOMYOPATHY:  EF was 40 - 45% last year.  Echo pending this admission.     ATRIAL FLUTTER/FIB:    Start heparin when INR is less than 2.  Anticipate need for cardiac cath.  I will give vit K so that we can try to do his cath tomorrow.  Switch to Eliquis at discharge.  I am anticipating rate control and anticoagulation.   He will likely need DCCV in three weeks if he remains in fib/flutter.    PULMONARY HTN:  This was noted on the previous echo.  Follow with repeat echo this admission.   ELEVATED TROPONIN:  I was able to find a cath from 2003 with normal coronaries.  He will need cardiac cath with enzyme elevation and presenting complaint.       For questions or updates, please contact [place] Please consult [email or web address] for contact info under Cardiology/STEMI.   Signed, Minus Breeding, MD  11/04/2019, 8:31 AM

## 2019-11-05 LAB — CBC
HCT: 41.8 % (ref 39.0–52.0)
Hemoglobin: 13.2 g/dL (ref 13.0–17.0)
MCH: 30.6 pg (ref 26.0–34.0)
MCHC: 31.6 g/dL (ref 30.0–36.0)
MCV: 97 fL (ref 80.0–100.0)
Platelets: 186 10*3/uL (ref 150–400)
RBC: 4.31 MIL/uL (ref 4.22–5.81)
RDW: 14 % (ref 11.5–15.5)
WBC: 8.2 10*3/uL (ref 4.0–10.5)
nRBC: 0 % (ref 0.0–0.2)

## 2019-11-05 LAB — BASIC METABOLIC PANEL
Anion gap: 9 (ref 5–15)
BUN: 20 mg/dL (ref 8–23)
CO2: 26 mmol/L (ref 22–32)
Calcium: 8.3 mg/dL — ABNORMAL LOW (ref 8.9–10.3)
Chloride: 103 mmol/L (ref 98–111)
Creatinine, Ser: 0.96 mg/dL (ref 0.61–1.24)
GFR calc Af Amer: >60 mL/min (ref >60)
GFR calc non Af Amer: >60 mL/min (ref >60)
Glucose, Bld: 94 mg/dL (ref 70–99)
Potassium: 4.2 mmol/L (ref 3.5–5.1)
Sodium: 138 mmol/L (ref 135–145)

## 2019-11-05 LAB — HEMOGLOBIN A1C
Hgb A1c MFr Bld: 6.2 % — ABNORMAL HIGH (ref 4.8–5.6)
Mean Plasma Glucose: 131.24 mg/dL

## 2019-11-05 LAB — PROTIME-INR
INR: 2 — ABNORMAL HIGH (ref 0.8–1.2)
INR: 1.5 — ABNORMAL HIGH (ref 0.8–1.2)
Prothrombin Time: 22.1 seconds — ABNORMAL HIGH (ref 11.4–15.2)
Prothrombin Time: 17.8 seconds — ABNORMAL HIGH (ref 11.4–15.2)

## 2019-11-05 MED ORDER — HEPARIN (PORCINE) 25000 UT/250ML-% IV SOLN
1350.0000 [IU]/h | INTRAVENOUS | Status: DC
  Administered 2019-11-05: 1350 [IU]/h via INTRAVENOUS
  Filled 2019-11-05: qty 250

## 2019-11-05 MED ORDER — METOPROLOL TARTRATE 50 MG PO TABS
50.0000 mg | ORAL_TABLET | Freq: Three times a day (TID) | ORAL | Status: DC
  Administered 2019-11-05 – 2019-11-06 (×3): 50 mg via ORAL
  Filled 2019-11-05 (×3): qty 1

## 2019-11-05 MED ORDER — AMIODARONE HCL 200 MG PO TABS
200.0000 mg | ORAL_TABLET | Freq: Every day | ORAL | Status: DC
  Administered 2019-11-05 – 2019-11-06 (×2): 200 mg via ORAL
  Filled 2019-11-05 (×2): qty 1

## 2019-11-05 MED ORDER — PHYTONADIONE 5 MG PO TABS
5.0000 mg | ORAL_TABLET | Freq: Once | ORAL | Status: AC
  Administered 2019-11-05: 5 mg via ORAL
  Filled 2019-11-05: qty 1

## 2019-11-05 NOTE — Progress Notes (Addendum)
ANTICOAGULATION CONSULT NOTE -follow up Pharmacy Consult for Heparin, start when INR <2.0 Indication: atrial fibrillation  Allergies  Allergen Reactions  . Dilaudid [Hydromorphone Hcl] Other (See Comments)    Makes pt hyper   . Dilaudid [Hydromorphone] Other (See Comments)    hyperactiviity  . Tegretol [Carbamazepine] Hives  . Tegretol [Carbamazepine] Hives    Patient Measurements: Height: 5' 8" (172.7 cm) Weight: 115.5 kg (254 lb 9.6 oz) (scale b) IBW/kg (Calculated) : 68.4 Heparin Dosing Weight: 95 kg  Vital Signs: Temp: 97.6 F (36.4 C) (09/01 0808) Temp Source: Oral (09/01 0808) BP: 107/73 (09/01 0808) Pulse Rate: 64 (09/01 0808)  Labs: Recent Labs    11/03/19 1855 11/03/19 1855 11/03/19 2346 11/04/19 0221 11/04/19 0532 11/04/19 1634 11/05/19 0733  HGB 13.8   < >  --  13.3  --   --  13.2  HCT 44.6  --   --  42.3  --   --  41.8  PLT 198  --   --  203  --   --  186  LABPROT 27.6*  --   --   --   --  32.9* 22.1*  INR 2.7*  --   --   --   --  3.3* 2.0*  CREATININE 1.51*  --   --  1.23  --   --  0.96  TROPONINIHS 539*   < > 3,943* 4,176* 3,759*  --   --    < > = values in this interval not displayed.    Estimated Creatinine Clearance: 80.7 mL/min (by C-G formula based on SCr of 0.96 mg/dL).   Medical History: Past Medical History:  Diagnosis Date  . Acquired thrombophilia ([place]) 01/15/2019  . Aortic atherosclerosis ([place])   . Atrial fibrillation with RVR ([place]) 10/29/2017  . CAP (community acquired pneumonia) 03/06/2016  . Cardiomyopathy ([place])    a. EF 40-45% in 04/2016, etiology not defined, managed medically.  . CHF exacerbation ([place]) 10/29/2017  . Chronic laryngitis 10/03/2016  . COPD GOLD II  08/02/2016   Quit smoking 2000  PFT's  07/10/2016  FEV1 1.98 (70 % ) ratio 67  p 19 % improvement from saba p nothing  prior to study while of coreg so rec as of 08/02/2016 try off coreg and on bisoprolol     . Dysphonia 10/03/2016  . Essential hypertension 03/06/2016    Changed from coreg to bisoprolol due to copd with reversible component  08/02/2016 >>>   . Fever 03/12/2015  . History of cardioversion   . Hoarseness 08/31/2016   Referred to ent 08/31/2016 >>> seen 10/03/16 [name] dx gerd and rhinitis medicamentosa   > improved on f/u 01/31/17 on gerd rx/ flonase and off afrin  . Hyperlipidemia   . Hypertension   . Laryngopharyngeal reflux (LPR) 10/03/2016  . Moderate persistent asthma 08/02/2016  . Morbid (severe) obesity due to excess calories ([place]) 08/02/2016  . Myocardial infarction [facility] 1980-early 2000s X 2   "mild ones" (03/06/2016)  . Nasal polyps 10/03/2016  . OSA on CPAP    a. pt states was told by Dr. Maxwell Caul that he would not require CPAP as long as he continued to sleep in recliner which he does for his back.  . Paroxysmal atrial fibrillation ([place])   . Pneumonia    "when I was a kid" (03/06/2016)  . Prolonged QT interval 10/29/2017  . RBBB   . 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 Rhinitis medicamentosa 10/03/2016  . Right thyroid nodule 03/06/2016  .  Seizures ([place])    "take RX daily" (03/06/2016)  . Sensorineural hearing loss (SNHL) of both ears 01/31/2017    Medications:  No current facility-administered medications on file prior to encounter.   Current Outpatient Medications on File Prior to Encounter  Medication Sig Dispense Refill  . acetaminophen (TYLENOL) 500 MG tablet Take 500-1,000 mg by mouth daily as needed for moderate pain.     Marland Kitchen amiodarone (PACERONE) 200 MG tablet Take 1 tablet (200 mg total) by mouth daily. 90 tablet 1  . ARTIFICIAL TEAR SOLUTION OP Place 1 drop into both eyes 2 (two) times daily.    . famotidine (PEPCID) 20 MG tablet One at bedtime (Patient taking differently: Take 20 mg by mouth at bedtime. One at bedtime) 30 tablet 2  . finasteride (PROSCAR) 5 MG tablet Take 5 mg by mouth daily at 12 noon.     . fluticasone (FLONASE) 50 MCG/ACT nasal spray Place 2 sprays into both nostrils 2 (two) times daily.    Marland Kitchen loratadine  (CLARITIN) 10 MG tablet Take 10 mg by mouth daily at 12 noon.     Marland Kitchen losartan (COZAAR) 50 MG tablet Take 50 mg by mouth daily with lunch.    . metoprolol succinate (TOPROL-XL) 100 MG 24 hr tablet Take 100 mg by mouth daily.     . Multiple Vitamin (MULTIVITAMIN WITH MINERALS) TABS tablet Take 1 tablet by mouth daily at 12 noon.    Marland Kitchen PHENobarbital (LUMINAL) 97.2 MG tablet Take 97.2 mg by mouth at bedtime.     . polyvinyl alcohol (ARTIFICIAL TEARS) 1.4 % ophthalmic solution Place 1 drop into both eyes daily with lunch.    . potassium chloride (KLOR-CON) 10 MEQ tablet Take 10 mEq by mouth daily with lunch.    . pravastatin (PRAVACHOL) 40 MG tablet Take 40 mg by mouth at bedtime.     Marland Kitchen warfarin (COUMADIN) 3 MG tablet Take 3 mg by mouth daily.     . furosemide (LASIX) 40 MG tablet Take 1 tablet (40 mg total) by mouth daily. (Patient not taking: Reported on 11/04/2019) 90 tablet 3     Assessment: 74 y.o. male admitted with atrial flutter, h/o Afib and Coumadin on hold, for heparin once INR < 2.  INR 2.7 on admission 8/31  S/p Vitamin K 5 mg PO x1 given 8/31> INR 3.3 on 8/31 PM.   INR this AM 11/05/19 is 2.0 Cardiologist ordered to give second dose of Vitamin K 5 mg po this AM,  Cardiac cath scheduled for 9/2.  Goal of Therapy:  Heparin level 0.3-0.7 units/ml Monitor platelets by anticoagulation protocol: Yes   Plan:  F/U PT/INR tonight, draw ~ 18:00 To start IV heparin drip when INR <2.0  Cardiologist noted plan is to start Eliquis at discharge.    Nicole Cella, RPh Clinical Pharmacist [phone] Please check AMION for all [facility] phone numbers. After 10:00 PM, call [place] [phone]  11/05/2019,10:09 AM

## 2019-11-05 NOTE — Consult Note (Signed)
[facility] Inpatient Consult   11/05/2019  Ruben Murray September 05, 1945 [id number]  [place] Organization [ACO] Patient:  [name]   Patient is currently active with [facility] for chronic disease management services.  Patient has been engaged by a [place] Coordinator for quarterly disease management follow up.  Our community based plan of care has focused on disease management and community resource support.    Plan: Patient discussed in unit progression meeting today. Notified Inpatient Transition Of Care [TOC] team member to make aware that [facility] following. Will alert [facility] RN CM of hospital admission.   Current disposition unknown.   Of note, [facility] Care Management services does not replace or interfere with any services that are needed or arranged by inpatient [facility] care management team.  For additional questions or referrals please contact:  Natividad Brood, RN BSN [facility] Liaison  [phone] business mobile phone Toll free office [phone]  Fax number: [phone] [email or web address]

## 2019-11-05 NOTE — Progress Notes (Signed)
PROGRESS NOTE  Ruben Murray  DOB: [birth year]  PCP: [name], MD ACA:[id number]  DOA: 11/03/2019  LOS: 1 day   Chief Complaint  Patient presents with  . Atrial Fibrillation    Brief narrative: Ruben Murray is a 74 y.o. male with PMH of nonischemic cardiomyopathy last EF measured in January 2020 was 44 to 45% with history of atrial fibrillation, multiple cardioversion, presently on Coumadin and metoprolol, history of seizures, recently diagnosed with COVID-19 infection, completed quarantine for 10 days at home. Patient presented to the ED on 11/03/2019 with dizziness and near syncope. EMS noted tachycardia and brought him to the ED.    In the ED, patient was noted to be in a flutter with RVR and was started on Cardizem drip.   Labs showed BNP elevated to 224, troponin elevated to 539, creatinine 1.5, INR 2.7 on Coumadin. Patient was admitted under hospitalist service.  Cardiology consultation was obtained.  Subjective: Patient was seen and examined this morning.  Patient elderly Caucasian male.  Lying on bed.  Not in distress.  He was tentatively plan for cardiac cath today which seems to have been postponed for tomorrow. Patient also reports that he has history of seizure disorders on phenobarbital, and last seizure was 20 years ago.  This morning, he woke up around 4 AM and was completely disoriented.  He felt like he had a petit mal seizure.  Assessment/Plan: Atrial flutter with RVR -Presented with syncope, was in A. fib with RVR on presentation.  -Initially started on Cardizem drip. Cardiology following.  -He has been switched to oral amiodarone and oral metoprolol this time.  -On Coumadin at home.  Currently on hold in prevention of cardiac cath.  INR is actually being reversed for cardiac cath. -Noted a plan to initiate Eliquis at discharge.  NSTEMI  h/o nonischemic cardiomyopathy Pulmonary hypertension Essential hypertension -Troponin peaked at 4176. -Noted a  plan for left and right heart cath tomorrow. -Not actively having chest pain. -Continue metoprolol, losartan, aspirin and statin at this time. -Blood pressure in low normal range.  Hyperlipidemia -Continue Pravachol  Recent Covid 19 infection -Tested positive on 8/20 -Now asymptomatic and completed home quarantine for 10 days  History of seizures -Continue phenobarbital  AKI on CKD stage IIIa -Resolved Recent Labs    01/15/19 1130 03/11/19 1222 04/11/19 0802 08/31/19 2022 08/31/19 2152 11/03/19 1855 11/04/19 0221 11/05/19 0733  CREATININE 1.08 1.03 1.00 1.35* 1.50* 1.51* 1.23 0.96   Mobility: Uses a cane at home.  Encouraged ambulation Code Status:   Code Status: DNR  Nutritional status: Body mass index is 38.71 kg/m.     Diet Order            Diet NPO time specified Except for: Sips with Meds  Diet effective midnight           Diet Heart Room service appropriate? Yes; Fluid consistency: Thin  Diet effective now                 DVT prophylaxis:   Antimicrobials:  None Fluid: None Consultants: Cardiology Family Communication:  None at bedside  Status is: Inpatient  Remains inpatient appropriate because pending cardiac cath   Dispo: The patient is from: Home              Anticipated d/c is to: Home              Anticipated d/c date is: 2 days  Patient currently is not medically stable to d/c.       Infusions:    Scheduled Meds: . amiodarone  200 mg Oral Daily  . aspirin EC  81 mg Oral Daily  . famotidine  20 mg Oral BID  . finasteride  5 mg Oral Q1200  . fluticasone  2 spray Each Nare BID  . loratadine  10 mg Oral Q1200  . losartan  50 mg Oral Daily  . metoprolol tartrate  50 mg Oral Q8H  . multivitamin with minerals  1 tablet Oral Q1200  . PHENobarbital  97.2 mg Oral QHS  . phenobarbital  97.2 mg Oral Once  . pravastatin  40 mg Oral QHS    Antimicrobials: Anti-infectives (From admission, onward)   None       PRN meds: acetaminophen **OR** acetaminophen   Objective: Vitals:   11/05/19 1453 11/05/19 1454  BP: 114/77 114/77  Pulse: (!) 102 100  Resp:    Temp:    SpO2:  96%    Intake/Output Summary (Last 24 hours) at 11/05/2019 1537 Last data filed at 11/05/2019 1241 Gross per 24 hour  Intake 1116.34 ml  Output 200 ml  Net 916.34 ml   Filed Weights   11/03/19 1838 11/04/19 1125 11/05/19 0419  Weight: 117.9 kg 115.7 kg 115.5 kg   Weight change: -2.268 kg Body mass index is 38.71 kg/m.   Physical Exam: General exam: Appears calm and comfortable.  Not in physical distress Skin: No rashes, lesions or ulcers. HEENT: Atraumatic, normocephalic, supple neck, no obvious bleeding Lungs: Clear to auscultation bilaterally CVS: Regular rate and rhythm, no murmur GI/Abd soft, nontender, nondistended, bowel sound present CNS: Alert, awake, oriented x3 Psychiatry: Mood appropriate Extremities: No pedal edema, no calf tenderness  Data Review: I have personally reviewed the laboratory data and studies available.  Recent Labs  Lab 11/03/19 1855 11/04/19 0221 11/05/19 0733  WBC 10.5 10.2 8.2  NEUTROABS 8.8*  --   --   HGB 13.8 13.3 13.2  HCT 44.6 42.3 41.8  MCV 98.2 97.7 97.0  PLT 198 203 186   Recent Labs  Lab 11/03/19 1855 11/04/19 0221 11/05/19 0733  NA 143 140 138  K 4.3 4.6 4.2  CL 106 104 103  CO2 _0 GLUCOSE 131* 106* 94  BUN _1 CREATININE 1.51* 1.23 0.96  CALCIUM 8.6* 8.4* 8.3*   Signed, Terrilee Croak, MD [facility] Pager: [phone] (Secure Chat preferred). 11/05/2019

## 2019-11-05 NOTE — Progress Notes (Signed)
Progress Note  Patient Name: Ruben Murray Date of Encounter: 11/05/2019  Primary Cardiologist:   [name], MD   Subjective   No pain.  No SOB.    Inpatient Medications    Scheduled Meds: . aspirin EC  81 mg Oral Daily  . famotidine  20 mg Oral BID  . finasteride  5 mg Oral Q1200  . fluticasone  2 spray Each Nare BID  . loratadine  10 mg Oral Q1200  . losartan  50 mg Oral Daily  . metoprolol succinate  100 mg Oral QHS  . multivitamin with minerals  1 tablet Oral Q1200  . PHENobarbital  97.2 mg Oral QHS  . phenobarbital  97.2 mg Oral Once  . pravastatin  40 mg Oral QHS   Continuous Infusions: . diltiazem (CARDIZEM) infusion 5 mg/hr (11/04/19 1538)   PRN Meds: acetaminophen **OR** acetaminophen   Vital Signs    Vitals:   11/04/19 1942 11/04/19 2355 11/05/19 0419 11/05/19 0808  BP: 108/76 109/66 134/83 107/73  Pulse: 65 84 97 64  Resp: _0 Temp: 97.7 F (36.5 C) 97.9 F (36.6 C) (!) 97.4 F (36.3 C) 97.6 F (36.4 C)  TempSrc: Oral Oral Oral Oral  SpO2: 96% 98% 98% 96%  Weight:   115.5 kg   Height:        Intake/Output Summary (Last 24 hours) at 11/05/2019 0833 Last data filed at 11/04/2019 2300 Gross per 24 hour  Intake 476.34 ml  Output 200 ml  Net 276.34 ml   Filed Weights   11/03/19 1838 11/04/19 1125 11/05/19 0419  Weight: 117.9 kg 115.7 kg 115.5 kg    Telemetry    Atrial flutter with slow ventricular rate - Personally Reviewed  ECG    NA - Personally Reviewed  Physical Exam   GEN: No  acute distress.   Neck: No  JVD Cardiac: Irregular RR, no murmurs, rubs, or gallops.  Respiratory: Clear   to auscultation bilaterally. GI: Soft, nontender, non-distended, normal bowel sounds  MS:  Trace edema; No deformity. Neuro:   Nonfocal  Psych: Oriented and appropriate    Labs    Chemistry Recent Labs  Lab 11/03/19 1855 11/04/19 0221  NA 143 140  K 4.3 4.6  CL 106 104  CO2 25 27  GLUCOSE 131* 106*  BUN 21 20  CREATININE  1.51* 1.23  CALCIUM 8.6* 8.4*  PROT 6.4*  --   ALBUMIN 3.4*  --   AST 35  --   ALT 28  --   ALKPHOS 113  --   BILITOT 0.7  --   GFRNONAA 44* 57*  GFRAA 51* >60  ANIONGAP 12 9     Hematology Recent Labs  Lab 11/03/19 1855 11/04/19 0221 11/05/19 0733  WBC 10.5 10.2 8.2  RBC 4.54 4.33 4.31  HGB 13.8 13.3 13.2  HCT 44.6 42.3 41.8  MCV 98.2 97.7 97.0  MCH 30.4 30.7 30.6  MCHC 30.9 31.4 31.6  RDW 14.0 14.0 14.0  PLT 198 203 186    Cardiac EnzymesNo results for input(s): TROPONINI in the last 168 hours. No results for input(s): TROPIPOC in the last 168 hours.   BNP Recent Labs  Lab 11/03/19 1855  BNP 224.8*     DDimer No results for input(s): DDIMER in the last 168 hours.   Radiology    DG Chest Port 1 View  Result Date: 11/03/2019 CLINICAL DATA:  Arrhythmia with history of COVID positive. EXAM: PORTABLE CHEST  1 VIEW COMPARISON:  August 31, 2019 FINDINGS: A very mild amount of atelectasis is seen within the left lung base. There is no evidence of a pleural effusion or pneumothorax. The cardiac silhouette is mildly enlarged. The visualized skeletal structures are unremarkable. IMPRESSION: 1. Very mild amount of left basilar atelectasis. 2. Mild cardiomegaly. Electronically Signed   By: Virgina Norfolk M.D.   On: 11/03/2019 19:22   ECHOCARDIOGRAM COMPLETE  Result Date: 11/04/2019    ECHOCARDIOGRAM REPORT   Patient Name:   Ruben Murray Date of Exam: 11/04/2019 Medical Rec #:  [id number]       Height:       68.0 in Accession #:    [id number]      Weight:       255.0 lb Date of Birth:  1945-09-21       BSA:          2.266 m Patient Age:    74 years        BP:           102/73 mmHg Patient Gender: M               HR:           82 bpm. Exam Location:  Inpatient Procedure: 2D Echo, Cardiac Doppler, Color Doppler and Intracardiac            Opacification Agent Indications:    Atrial flutter  History:        Patient has prior history of Echocardiogram examinations, most                  recent 03/18/2018. Cardiomyopathy, Previous Myocardial                 Infarction, COPD, Arrythmias:Atrial Fibrillation and Atrial                 Flutter; Risk Factors:Obesity. Covid.  Sonographer:    Dustin Flock Referring Phys: [place]  1. Left ventricular ejection fraction, by estimation, is 20 to 25%. The left ventricle has severely decreased function. The left ventricle demonstrates global hypokinesis. The left ventricular internal cavity size was moderately dilated. Left ventricular diastolic parameters are indeterminate.  2. Right ventricular systolic function is moderately reduced. The right ventricular size is moderately enlarged. There is mildly elevated pulmonary artery systolic pressure. The estimated right ventricular systolic pressure is 87.8 mmHg.  3. Left atrial size was mildly dilated.  4. Right atrial size was moderately dilated.  5. The mitral valve is normal in structure. Mild mitral valve regurgitation. No evidence of mitral stenosis.  6. The aortic valve is tricuspid. Aortic valve regurgitation is mild. No aortic stenosis is present.  7. The inferior vena cava is dilated in size with <50% respiratory variability, suggesting right atrial pressure of 15 mmHg. FINDINGS  Left Ventricle: Left ventricular ejection fraction, by estimation, is 20 to 25%. The left ventricle has severely decreased function. The left ventricle demonstrates global hypokinesis. Definity contrast agent was given IV to delineate the left ventricular endocardial borders. The left ventricular internal cavity size was moderately dilated. There is no left ventricular hypertrophy. Left ventricular diastolic parameters are indeterminate. Right Ventricle: The right ventricular size is moderately enlarged. No increase in right ventricular wall thickness. Right ventricular systolic function is moderately reduced. There is mildly elevated pulmonary artery systolic pressure. The tricuspid regurgitant  velocity is 2.40 m/s, and with an assumed right atrial pressure of 15 mmHg, the estimated right ventricular  systolic pressure is 00.3 mmHg. Left Atrium: Left atrial size was mildly dilated. Right Atrium: Right atrial size was moderately dilated. Pericardium: There is no evidence of pericardial effusion. Mitral Valve: The mitral valve is normal in structure. Mild mitral annular calcification. Mild mitral valve regurgitation. No evidence of mitral valve stenosis. Tricuspid Valve: The tricuspid valve is normal in structure. Tricuspid valve regurgitation is mild. Aortic Valve: The aortic valve is tricuspid. Aortic valve regurgitation is mild. Aortic regurgitation PHT measures 855 msec. No aortic stenosis is present. Pulmonic Valve: The pulmonic valve was normal in structure. Pulmonic valve regurgitation is trivial. Aorta: The aortic root is normal in size and structure. Venous: The inferior vena cava is dilated in size with less than 50% respiratory variability, suggesting right atrial pressure of 15 mmHg. IAS/Shunts: No atrial level shunt detected by color flow Doppler.  LEFT VENTRICLE PLAX 2D LVIDd:         6.30 cm  Diastology LVIDs:         4.90 cm  LV e' lateral:   11.50 cm/s LV PW:         1.10 cm  LV E/e' lateral: 6.2 LV IVS:        0.90 cm  LV e' medial:    7.72 cm/s LVOT diam:     2.60 cm  LV E/e' medial:  9.2 LV SV:         54 LV SV Index:   24 LVOT Area:     5.31 cm  RIGHT VENTRICLE RV Basal diam:  4.40 cm RV S prime:     10.10 cm/s TAPSE (M-mode): 3.0 cm LEFT ATRIUM             Index       RIGHT ATRIUM           Index LA diam:        4.80 cm 2.12 cm/m  RA Area:     28.20 cm LA Vol (A2C):   69.4 ml 30.63 ml/m RA Volume:   113.00 ml 49.87 ml/m LA Vol (A4C):   65.1 ml 28.73 ml/m LA Biplane Vol: 70.1 ml 30.94 ml/m  AORTIC VALVE LVOT Vmax:   54.20 cm/s LVOT Vmean:  34.500 cm/s LVOT VTI:    0.102 m AI PHT:      855 msec  AORTA Ao Root diam: 3.30 cm MITRAL VALVE               TRICUSPID VALVE MV Area (PHT):  4.31 cm    TR Peak grad:   23.0 mmHg MV Decel Time: 176 msec    TR Vmax:        240.00 cm/s MV E velocity: 71.10 cm/s MV A velocity: 32.60 cm/s  SHUNTS MV E/A ratio:  2.18        Systemic VTI:  0.10 m                            Systemic Diam: 2.60 cm Loralie Champagne MD Electronically signed by Loralie Champagne MD Signature Date/Time: 11/04/2019/5:35:14 PM    Final     Cardiac Studies   ECHO PENDING   1. Left ventricular ejection fraction, by estimation, is 20 to 25%. The  left ventricle has severely decreased function. The left ventricle  demonstrates global hypokinesis. The left ventricular internal cavity size  was moderately dilated. Left  ventricular diastolic parameters are indeterminate.  2. Right ventricular systolic function is moderately reduced.  The right  ventricular size is moderately enlarged. There is mildly elevated  pulmonary artery systolic pressure. The estimated right ventricular  systolic pressure is 26.2 mmHg.  3. Left atrial size was mildly dilated.  4. Right atrial size was moderately dilated.  5. The mitral valve is normal in structure. Mild mitral valve  regurgitation. No evidence of mitral stenosis.  6. The aortic valve is tricuspid. Aortic valve regurgitation is mild. No  aortic stenosis is present.  7. The inferior vena cava is dilated in size with <50% respiratory  variability, suggesting right atrial pressure of 15 mmHg.   Patient Profile     74 y.o. male  with a hx of atrial fibrillation  who was seen earlier today for the evaluation of atrial flutter  at the request of Dr Sabra Heck.     Assessment & Plan    CARDIOMYOPATHY:  EF is markedly reversed compared with previous.   Will plan right and left heart cath.    I am going to hold off on ARB pending the cath.  I will increase the metoprolol (and for now give this in divided dose to make sure he tolerates the increased dose.  I will stop the Cardizem IV.  *  ATRIAL FLUTTER/FIB:    Start heparin when INR  is less than 2.  Plan cardiac cath.  However, the INR is up despite getting vitamin K.  I will have to put the cath on hold.   I will give 10 mg PO today.   I will restart amiodarone in anticipation that he will eventually get cardioverted back to NSR.  Plan is to start Eliquis at discharge.      PULMONARY HTN:   Left and right heart cath as above.  Will post for tomorrow.   ELEVATED TROPONIN:   Will be assessed as above.     For questions or updates, please contact [place] Please consult [email or web address] for contact info under Cardiology/STEMI.   Signed, Minus Breeding, MD  11/05/2019, 8:33 AM

## 2019-11-05 NOTE — Progress Notes (Signed)
ANTICOAGULATION CONSULT NOTE  - Follow Up Consultation Pharmacy Consult for Heparin, Start When INR <2.0 Indication: atrial fibrillation  Allergies  Allergen Reactions  . Dilaudid [Hydromorphone Hcl] Other (See Comments)    Makes pt hyper   . Dilaudid [Hydromorphone] Other (See Comments)    hyperactiviity  . Tegretol [Carbamazepine] Hives  . Tegretol [Carbamazepine] Hives    Patient Measurements: Height: _0  (172.7 cm) Weight: 115.5 kg (254 lb 9.6 oz) (scale b) IBW/kg (Calculated) : 68.4 Heparin Dosing Weight: 95.2 kg  Vital Signs: Temp: 98.2 F (36.8 C) (09/01 1649) Temp Source: Oral (09/01 1649) BP: 112/80 (09/01 1649) Pulse Rate: 89 (09/01 1649)  Labs: Recent Labs    11/03/19 1855 11/03/19 1855 11/03/19 2346 11/04/19 0221 11/04/19 0532 11/04/19 1634 11/05/19 0733 11/05/19 1842  HGB 13.8   < >  --  13.3  --   --  13.2  --   HCT 44.6  --   --  42.3  --   --  41.8  --   PLT 198  --   --  203  --   --  186  --   LABPROT 27.6*   < >  --   --   --  32.9* 22.1* 17.8*  INR 2.7*   < >  --   --   --  3.3* 2.0* 1.5*  CREATININE 1.51*  --   --  1.23  --   --  0.96  --   TROPONINIHS 539*   < > 3,943* 4,176* 3,759*  --   --   --    < > = values in this interval not displayed.    Estimated Creatinine Clearance: 80.7 mL/min (by C-G formula based on SCr of 0.96 mg/dL).   Medical History: Past Medical History:  Diagnosis Date  . Acquired thrombophilia ([place]) 01/15/2019  . Aortic atherosclerosis ([place])   . Atrial fibrillation with RVR ([place]) 10/29/2017  . CAP (community acquired pneumonia) 03/06/2016  . Cardiomyopathy ([place])    a. EF 40-45% in 04/2016, etiology not defined, managed medically.  . CHF exacerbation ([place]) 10/29/2017  . Chronic laryngitis 10/03/2016  . COPD GOLD II  08/02/2016   Quit smoking 2000  PFT's  07/10/2016  FEV1 1.98 (70 % ) ratio 67  p 19 % improvement from saba p nothing  prior to study while of coreg so rec as of 08/02/2016 try off coreg and on bisoprolol      . Dysphonia 10/03/2016  . Essential hypertension 03/06/2016   Changed from coreg to bisoprolol due to copd with reversible component  08/02/2016 >>>   . Fever 03/12/2015  . History of cardioversion   . Hoarseness 08/31/2016   Referred to ent 08/31/2016 >>> seen 10/03/16 [name] dx gerd and rhinitis medicamentosa   > improved on f/u 01/31/17 on gerd rx/ flonase and off afrin  . Hyperlipidemia   . Hypertension   . Laryngopharyngeal reflux (LPR) 10/03/2016  . Moderate persistent asthma 08/02/2016  . Morbid (severe) obesity due to excess calories ([place]) 08/02/2016  . Myocardial infarction [facility]) 1980-early 2000s X 2   "mild ones" (03/06/2016)  . Nasal polyps 10/03/2016  . OSA on CPAP    a. pt states was told by Dr. Maxwell Caul that he would not require CPAP as long as he continued to sleep in recliner which he does for his back.  . Paroxysmal atrial fibrillation ([place])   . Pneumonia    "when I was a kid" (03/06/2016)  . Prolonged QT  interval 10/29/2017  . RBBB   . 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 Rhinitis medicamentosa 10/03/2016  . Right thyroid nodule 03/06/2016  . Seizures ([place])    "take RX daily" (03/06/2016)  . Sensorineural hearing loss (SNHL) of both ears 01/31/2017    Assessment: 74 y.o. male was admitted with atrial flutter, hx of atrial fibrillation; warfarin on hold. Pharmacy was consulted to dose IV heparin once INR < 2.  INR was 2.7 on admission 8/31; S/P vitamin K 5 mg PO X 1 on 8/31; INR was 3.3 on 8/31 PM. INR this AM (9/1) is 2.0. Pt rec'd second dose of vitamin K 5 mg PO X 1 earlier today; INR this evening is down to 1.5. Cardiac cath is scheduled for 11/06/19.  CBC WNL  Goal of Therapy:  Heparin level 0.3-0.7 units/ml Monitor platelets by anticoagulation protocol: Yes   Plan:  Start heparin infusion (no bolus) at 1350 units/hr Check heparin level in ~7 hrs Monitor daily heparin level, INR, CBC Monitor for signs/symptoms of bleeding Cardiologist noted plan is to start apixaban at  discharge   Gillermina Hu, PharmD, BCPS, [place] Pharmacist 11/05/2019,7:36 PM

## 2019-11-06 ENCOUNTER — Encounter (HOSPITAL_COMMUNITY)
Admission: EM | Disposition: A | Discharge status: Home / Self Care | Payer: Self-pay | Source: Home / Self Care | Attending: Internal Medicine

## 2019-11-06 ENCOUNTER — Encounter (HOSPITAL_COMMUNITY): Payer: Self-pay | Admitting: Cardiovascular Disease

## 2019-11-06 DIAGNOSIS — I420 Dilated cardiomyopathy: Secondary | ICD-10-CM

## 2019-11-06 HISTORY — PX: RIGHT/LEFT HEART CATH AND CORONARY ANGIOGRAPHY: CATH118266

## 2019-11-06 LAB — POCT I-STAT EG7
Acid-Base Excess: 3 mmol/L — ABNORMAL HIGH (ref 0.0–2.0)
Acid-Base Excess: 3 mmol/L — ABNORMAL HIGH (ref 0.0–2.0)
Bicarbonate: 29.6 mmol/L — ABNORMAL HIGH (ref 20.0–28.0)
Bicarbonate: 29.6 mmol/L — ABNORMAL HIGH (ref 20.0–28.0)
Calcium, Ion: 1.15 mmol/L (ref 1.15–1.40)
Calcium, Ion: 1.18 mmol/L (ref 1.15–1.40)
HCT: 40 % (ref 39.0–52.0)
HCT: 40 % (ref 39.0–52.0)
Hemoglobin: 13.6 g/dL (ref 13.0–17.0)
Hemoglobin: 13.6 g/dL (ref 13.0–17.0)
O2 Saturation: 57 %
O2 Saturation: 60 %
Potassium: 4.1 mmol/L (ref 3.5–5.1)
Potassium: 4.1 mmol/L (ref 3.5–5.1)
Sodium: 141 mmol/L (ref 135–145)
Sodium: 142 mmol/L (ref 135–145)
TCO2: 31 mmol/L (ref 22–32)
TCO2: 31 mmol/L (ref 22–32)
pCO2, Ven: 54.7 mmHg (ref 44.0–60.0)
pCO2, Ven: 54.9 mmHg (ref 44.0–60.0)
pH, Ven: 7.339 (ref 7.250–7.430)
pH, Ven: 7.34 (ref 7.250–7.430)
pO2, Ven: 33 mmHg (ref 32.0–45.0)
pO2, Ven: 34 mmHg (ref 32.0–45.0)

## 2019-11-06 LAB — POCT I-STAT 7, (LYTES, BLD GAS, ICA,H+H)
Acid-Base Excess: 1 mmol/L (ref 0.0–2.0)
Bicarbonate: 26.7 mmol/L (ref 20.0–28.0)
Calcium, Ion: 1.16 mmol/L (ref 1.15–1.40)
HCT: 39 % (ref 39.0–52.0)
Hemoglobin: 13.3 g/dL (ref 13.0–17.0)
O2 Saturation: 97 %
Potassium: 4 mmol/L (ref 3.5–5.1)
Sodium: 140 mmol/L (ref 135–145)
TCO2: 28 mmol/L (ref 22–32)
pCO2 arterial: 47.4 mmHg (ref 32.0–48.0)
pH, Arterial: 7.359 (ref 7.350–7.450)
pO2, Arterial: 94 mmHg (ref 83.0–108.0)

## 2019-11-06 LAB — CBC WITH DIFFERENTIAL/PLATELET
Abs Immature Granulocytes: 0.03 10*3/uL (ref 0.00–0.07)
Basophils Absolute: 0.1 10*3/uL (ref 0.0–0.1)
Basophils Relative: 1 %
Eosinophils Absolute: 0.3 10*3/uL (ref 0.0–0.5)
Eosinophils Relative: 3 %
HCT: 42.3 % (ref 39.0–52.0)
Hemoglobin: 13.7 g/dL (ref 13.0–17.0)
Immature Granulocytes: 0 %
Lymphocytes Relative: 31 %
Lymphs Abs: 3.1 10*3/uL (ref 0.7–4.0)
MCH: 31.4 pg (ref 26.0–34.0)
MCHC: 32.4 g/dL (ref 30.0–36.0)
MCV: 97 fL (ref 80.0–100.0)
Monocytes Absolute: 0.7 10*3/uL (ref 0.1–1.0)
Monocytes Relative: 7 %
Neutro Abs: 5.9 10*3/uL (ref 1.7–7.7)
Neutrophils Relative %: 58 %
Platelets: 196 10*3/uL (ref 150–400)
RBC: 4.36 MIL/uL (ref 4.22–5.81)
RDW: 14.2 % (ref 11.5–15.5)
WBC: 10.1 10*3/uL (ref 4.0–10.5)
nRBC: 0 % (ref 0.0–0.2)

## 2019-11-06 LAB — BASIC METABOLIC PANEL
Anion gap: 9 (ref 5–15)
BUN: 17 mg/dL (ref 8–23)
CO2: 24 mmol/L (ref 22–32)
Calcium: 8.5 mg/dL — ABNORMAL LOW (ref 8.9–10.3)
Chloride: 106 mmol/L (ref 98–111)
Creatinine, Ser: 1.12 mg/dL (ref 0.61–1.24)
GFR calc Af Amer: >60 mL/min (ref >60)
GFR calc non Af Amer: >60 mL/min (ref >60)
Glucose, Bld: 101 mg/dL — ABNORMAL HIGH (ref 70–99)
Potassium: 4.2 mmol/L (ref 3.5–5.1)
Sodium: 139 mmol/L (ref 135–145)

## 2019-11-06 LAB — PROTIME-INR
INR: 1.4 — ABNORMAL HIGH (ref 0.8–1.2)
Prothrombin Time: 16.4 seconds — ABNORMAL HIGH (ref 11.4–15.2)

## 2019-11-06 LAB — HEPARIN LEVEL (UNFRACTIONATED)
Heparin Unfractionated: 0.31 IU/mL (ref 0.30–0.70)
Heparin Unfractionated: <0.1 IU/mL — ABNORMAL LOW (ref 0.30–0.70)

## 2019-11-06 LAB — MAGNESIUM: Magnesium: 2 mg/dL (ref 1.7–2.4)

## 2019-11-06 SURGERY — RIGHT/LEFT HEART CATH AND CORONARY ANGIOGRAPHY
Anesthesia: LOCAL

## 2019-11-06 MED ORDER — ACETAMINOPHEN 325 MG PO TABS: 650.0000 mg | ORAL_TABLET | ORAL | Status: DC | PRN

## 2019-11-06 MED ORDER — HEPARIN (PORCINE) IN NACL 1000-0.9 UT/500ML-% IV SOLN
INTRAVENOUS | Status: AC
  Filled 2019-11-06: qty 1000

## 2019-11-06 MED ORDER — VERAPAMIL HCL 2.5 MG/ML IV SOLN
INTRAVENOUS | Status: DC | PRN
  Administered 2019-11-06: 10 mL via INTRA_ARTERIAL

## 2019-11-06 MED ORDER — SODIUM CHLORIDE 0.9 % IV SOLN: 250.0000 mL | INTRAVENOUS | Status: DC | PRN

## 2019-11-06 MED ORDER — LABETALOL HCL 5 MG/ML IV SOLN: 10.0000 mg | INTRAVENOUS | Status: AC | PRN

## 2019-11-06 MED ORDER — MIDAZOLAM HCL 2 MG/2ML IJ SOLN
INTRAMUSCULAR | Status: AC
  Filled 2019-11-06: qty 2

## 2019-11-06 MED ORDER — DIAZEPAM 5 MG PO TABS: 5.0000 mg | ORAL_TABLET | Freq: Four times a day (QID) | ORAL | Status: DC | PRN

## 2019-11-06 MED ORDER — HEPARIN SODIUM (PORCINE) 1000 UNIT/ML IJ SOLN
INTRAMUSCULAR | Status: DC | PRN
  Administered 2019-11-06: 5500 [IU] via INTRAVENOUS

## 2019-11-06 MED ORDER — MIDAZOLAM HCL 2 MG/2ML IJ SOLN
INTRAMUSCULAR | Status: DC | PRN
  Administered 2019-11-06: 1 mg via INTRAVENOUS

## 2019-11-06 MED ORDER — FENTANYL CITRATE (PF) 100 MCG/2ML IJ SOLN
INTRAMUSCULAR | Status: DC | PRN
  Administered 2019-11-06: 25 ug via INTRAVENOUS

## 2019-11-06 MED ORDER — LIDOCAINE HCL (PF) 1 % IJ SOLN
INTRAMUSCULAR | Status: DC | PRN
  Administered 2019-11-06 (×2): 2 mL

## 2019-11-06 MED ORDER — VERAPAMIL HCL 2.5 MG/ML IV SOLN
INTRAVENOUS | Status: AC
  Filled 2019-11-06: qty 2

## 2019-11-06 MED ORDER — SODIUM CHLORIDE 0.9% FLUSH: 3.0000 mL | Freq: Two times a day (BID) | INTRAVENOUS | Status: DC

## 2019-11-06 MED ORDER — LIDOCAINE HCL (PF) 1 % IJ SOLN
INTRAMUSCULAR | Status: AC
  Filled 2019-11-06: qty 30

## 2019-11-06 MED ORDER — ASPIRIN 81 MG PO CHEW
81.0000 mg | CHEWABLE_TABLET | ORAL | Status: AC
  Administered 2019-11-06: 81 mg via ORAL
  Filled 2019-11-06: qty 1

## 2019-11-06 MED ORDER — METOPROLOL SUCCINATE ER 100 MG PO TB24
200.0000 mg | ORAL_TABLET | Freq: Every day | ORAL | 0 refills | Status: DC
Start: 2019-11-06 — End: 2019-12-12

## 2019-11-06 MED ORDER — SODIUM CHLORIDE 0.9 % IV SOLN: INTRAVENOUS | Status: AC

## 2019-11-06 MED ORDER — DIGOXIN 250 MCG PO TABS: 0.2500 mg | ORAL_TABLET | Freq: Every day | ORAL | 0 refills | Status: DC

## 2019-11-06 MED ORDER — DIGOXIN 125 MCG PO TABS
0.2500 mg | ORAL_TABLET | Freq: Every day | ORAL | Status: DC
  Administered 2019-11-06: 0.25 mg via ORAL
  Filled 2019-11-06: qty 2

## 2019-11-06 MED ORDER — HEPARIN (PORCINE) IN NACL 1000-0.9 UT/500ML-% IV SOLN
INTRAVENOUS | Status: DC | PRN
  Administered 2019-11-06 (×2): 500 mL

## 2019-11-06 MED ORDER — METOPROLOL SUCCINATE ER 100 MG PO TB24
200.0000 mg | ORAL_TABLET | Freq: Every day | ORAL | Status: DC
  Administered 2019-11-06: 200 mg via ORAL
  Filled 2019-11-06: qty 2

## 2019-11-06 MED ORDER — HEPARIN SODIUM (PORCINE) 1000 UNIT/ML IJ SOLN
INTRAMUSCULAR | Status: AC
  Filled 2019-11-06: qty 1

## 2019-11-06 MED ORDER — ENOXAPARIN SODIUM 40 MG/0.4ML ~~LOC~~ SOLN: 40.0000 mg | SUBCUTANEOUS | Status: DC

## 2019-11-06 MED ORDER — SODIUM CHLORIDE 0.9% FLUSH: 3.0000 mL | INTRAVENOUS | Status: DC | PRN

## 2019-11-06 MED ORDER — HYDRALAZINE HCL 20 MG/ML IJ SOLN: 10.0000 mg | INTRAMUSCULAR | Status: AC | PRN

## 2019-11-06 MED ORDER — IOHEXOL 350 MG/ML SOLN
INTRAVENOUS | Status: DC | PRN
  Administered 2019-11-06: 35 mL

## 2019-11-06 MED ORDER — SODIUM CHLORIDE 0.9 % IV SOLN: INTRAVENOUS | Status: DC

## 2019-11-06 MED ORDER — FENTANYL CITRATE (PF) 100 MCG/2ML IJ SOLN
INTRAMUSCULAR | Status: AC
  Filled 2019-11-06: qty 2

## 2019-11-06 MED ORDER — APIXABAN 5 MG PO TABS: 5.0000 mg | ORAL_TABLET | Freq: Two times a day (BID) | ORAL | 0 refills | Status: DC

## 2019-11-06 SURGICAL SUPPLY — 13 items
CATH BALLN WEDGE 5F 110CM (CATHETERS) ×1 IMPLANT
CATH INFINITI JR4 5F (CATHETERS) ×1 IMPLANT
CATH OPTITORQUE TIG 4.0 5F (CATHETERS) ×1 IMPLANT
DEVICE RAD COMP TR BAND LRG (VASCULAR PRODUCTS) ×1 IMPLANT
GLIDESHEATH SLEND SS 6F .021 (SHEATH) ×1 IMPLANT
GUIDEWIRE INQWIRE 1.5J.035X260 (WIRE) IMPLANT
INQWIRE 1.5J .035X260CM (WIRE) ×2
KIT HEART LEFT (KITS) ×2 IMPLANT
PACK CARDIAC CATHETERIZATION (CUSTOM PROCEDURE TRAY) ×2 IMPLANT
SHEATH GLIDE SLENDER 4/5FR (SHEATH) ×1 IMPLANT
TRANSDUCER W/STOPCOCK (MISCELLANEOUS) ×2 IMPLANT
TUBING CIL FLEX 10 FLL-RA (TUBING) ×2 IMPLANT
WIRE MICROINTRODUCER 60CM (WIRE) ×1 IMPLANT

## 2019-11-06 NOTE — Progress Notes (Signed)
PROGRESS NOTE  Ruben Murray  DOB: 03-13-1945  PCP: Ruben Macadam, MD ATF:[id number]  DOA: 11/03/2019  LOS: 2 days   Chief Complaint  Patient presents with  . Atrial Fibrillation    Brief narrative: Ruben Murray is a 74 y.o. male with PMH of nonischemic cardiomyopathy last EF measured in January 2020 was 34 to 45% with history of atrial fibrillation, multiple cardioversion, presently on Coumadin and metoprolol, history of seizures, recently diagnosed with COVID-19 infection, completed quarantine for 10 days at home. Patient presented to the ED on 11/03/2019 with dizziness and near syncope. EMS noted tachycardia and brought him to the ED.    In the ED, patient was noted to be in a flutter with RVR and was started on Cardizem drip.   Labs showed BNP elevated to 224, troponin elevated to 539, creatinine 1.5, INR 2.7 on Coumadin. Patient was admitted under hospitalist service.  Cardiology consultation was obtained.  Subjective: Patient was seen and examined briefly this morning prior to cardiac cath. Not in distress.  No new symptoms.  Assessment/Plan: Atrial flutter with RVR -Presented with syncope, was in A. fib with RVR on presentation.  -Initially started on Cardizem drip. Cardiology following.  -He has been switched to oral amiodarone, oral metoprolol and oral digoxin this time.  -On Coumadin at home. Currently on hold in preparation of cardiac cath.  INR is actually being reversed for cardiac cath. -Noted a plan from cardiology to initiate Eliquis at discharge.  NSTEMI  Nonischemic cardiomyopathy Pulmonary hypertension Essential hypertension -Troponin peaked at 4176. -Echo 8/31 with LVEF 20 to 25%, global hypokinesis. -Patient is undergoing cardiac cath this morning. -Not actively having chest pain. -Continue metoprolol, losartan, aspirin and statin at this time. -Blood pressure in low normal range.   Hyperlipidemia -Continue Pravachol  Recent Covid 19  infection -Tested positive on 8/20 -Now asymptomatic and completed home quarantine for 10 days  History of seizures -Continue phenobarbital  AKI on CKD stage IIIa -Resolved Recent Labs    01/15/19 1130 03/11/19 1222 04/11/19 0802 08/31/19 2022 08/31/19 2152 11/03/19 1855 11/04/19 0221 11/05/19 0733 11/06/19 0449  CREATININE 1.08 1.03 1.00 1.35* 1.50* 1.51* 1.23 0.96 1.12   Mobility: Uses a cane at home.  Encouraged ambulation Code Status:   Code Status: DNR  Nutritional status: Body mass index is 38.97 kg/m.     Diet Order            Diet Heart Room service appropriate? Yes; Fluid consistency: Thin  Diet effective now                 DVT prophylaxis: enoxaparin (LOVENOX) injection 40 mg Start: 11/07/19 0800 SCD's Start: 11/06/19 1109  Antimicrobials:  None Fluid: None Consultants: Cardiology Family Communication:  None at bedside  Status is: Inpatient  Remains inpatient appropriate because undergoing cardiac cath this morning  Dispo: The patient is from: Home              Anticipated d/c is to: Home              Anticipated d/c date is: 2 days              Patient currently is not medically stable to d/c.  Infusions:  . sodium chloride    . sodium chloride      Scheduled Meds: . amiodarone  200 mg Oral Daily  . aspirin EC  81 mg Oral Daily  . digoxin  0.25 mg Oral Daily  . [START  ON 11/07/2019] enoxaparin (LOVENOX) injection  40 mg Subcutaneous Q24H  . famotidine  20 mg Oral BID  . finasteride  5 mg Oral Q1200  . fluticasone  2 spray Each Nare BID  . loratadine  10 mg Oral Q1200  . losartan  50 mg Oral Daily  . metoprolol succinate  200 mg Oral Daily  . multivitamin with minerals  1 tablet Oral Q1200  . PHENobarbital  97.2 mg Oral QHS  . phenobarbital  97.2 mg Oral Once  . pravastatin  40 mg Oral QHS  . sodium chloride flush  3 mL Intravenous Q12H    Antimicrobials: Anti-infectives (From admission, onward)   None      PRN  meds: sodium chloride, acetaminophen, diazepam, hydrALAZINE, labetalol, sodium chloride flush   Objective: Vitals:   11/06/19 1127 11/06/19 1130  BP:  105/71  Pulse: 83 83  Resp:    Temp:    SpO2:      Intake/Output Summary (Last 24 hours) at 11/06/2019 1134 Last data filed at 11/06/2019 0016 Gross per 24 hour  Intake 480 ml  Output 100 ml  Net 380 ml   Filed Weights   11/04/19 1125 11/05/19 0419 11/06/19 0014  Weight: 115.7 kg 115.5 kg 116.3 kg   Weight change: 0.59 kg Body mass index is 38.97 kg/m.   Physical Exam: General exam: Appears calm and comfortable.  Not in physical distress Skin: No rashes, lesions or ulcers. HEENT: Atraumatic, normocephalic, supple neck, no obvious bleeding Lungs: Clear to auscultate bilaterally CVS: Regular rate and rhythm, no murmur GI/Abd soft, nontender, nondistended, bowel sound present CNS: Alert, awake, oriented x3 Psychiatry: Mood appropriate Extremities: No pedal edema, no calf tenderness.  Data Review: I have personally reviewed the laboratory data and studies available.  Recent Labs  Lab 11/03/19 1855 11/04/19 0221 11/05/19 0733 11/06/19 0449  WBC 10.5 10.2 8.2 10.1  NEUTROABS 8.8*  --   --  5.9  HGB 13.8 13.3 13.2 13.7  HCT 44.6 42.3 41.8 42.3  MCV 98.2 97.7 97.0 97.0  PLT 198 203 186 196   Recent Labs  Lab 11/03/19 1855 11/04/19 0221 11/05/19 0733 11/06/19 0449  NA 143 140 138 139  K 4.3 4.6 4.2 4.2  CL 106 104 103 106  CO2 _0 GLUCOSE 131* 106* 94 101*  BUN _1 CREATININE 1.51* 1.23 0.96 1.12  CALCIUM 8.6* 8.4* 8.3* 8.5*  MG  --   --   --  2.0   Signed, Terrilee Croak, MD [facility] Pager: [phone] (Secure Chat preferred). 11/06/2019

## 2019-11-06 NOTE — Progress Notes (Signed)
TR band is removed at this time. Insertion site is level 0.

## 2019-11-06 NOTE — Progress Notes (Addendum)
Progress Note  Patient Name: Ruben Murray Date of Encounter: 11/06/2019  Primary Cardiologist:   [name], MD   Subjective   No pain.  Baseline chronic SOB.    Inpatient Medications    Scheduled Meds: . amiodarone  200 mg Oral Daily  . aspirin EC  81 mg Oral Daily  . famotidine  20 mg Oral BID  . finasteride  5 mg Oral Q1200  . fluticasone  2 spray Each Nare BID  . loratadine  10 mg Oral Q1200  . losartan  50 mg Oral Daily  . metoprolol tartrate  50 mg Oral Q8H  . multivitamin with minerals  1 tablet Oral Q1200  . PHENobarbital  97.2 mg Oral QHS  . phenobarbital  97.2 mg Oral Once  . pravastatin  40 mg Oral QHS  . sodium chloride flush  3 mL Intravenous Q12H   Continuous Infusions: . sodium chloride    . sodium chloride 10 mL/hr at 11/06/19 0500  . heparin 1,350 Units/hr (11/05/19 2127)   PRN Meds: sodium chloride, acetaminophen **OR** acetaminophen, sodium chloride flush   Vital Signs    Vitals:   11/05/19 1944 11/06/19 0014 11/06/19 0436 11/06/19 0742  BP: 98/70 121/73 104/85 119/85  Pulse: 68 (!) 110 (!) 109 (!) 107  Resp: _0 Temp: 97.6 F (36.4 C) 98.2 F (36.8 C) 97.7 F (36.5 C) (!) 97.4 F (36.3 C)  TempSrc: Oral Oral Oral Oral  SpO2: 96% 95% 98% 97%  Weight:  116.3 kg    Height:        Intake/Output Summary (Last 24 hours) at 11/06/2019 0758 Last data filed at 11/06/2019 0016 Gross per 24 hour  Intake 940 ml  Output 100 ml  Net 840 ml   Filed Weights   11/04/19 1125 11/05/19 0419 11/06/19 0014  Weight: 115.7 kg 115.5 kg 116.3 kg    Telemetry    Atrial flutter with variable conduction. - Personally Reviewed  ECG    NA - Personally Reviewed  Physical Exam   GEN: No  acute distress.   Neck: No  JVD Cardiac: Irregular RR, no murmurs, rubs, or gallops.  Respiratory:    Few basilar crackles.  GI: Soft, nontender, non-distended, normal bowel sounds  MS:  No edema; No deformity. Neuro:   Nonfocal  Psych: Oriented and  appropriate    Labs    Chemistry Recent Labs  Lab 11/03/19 1855 11/03/19 1855 11/04/19 0221 11/05/19 0733 11/06/19 0449  NA 143   < > 140 138 139  K 4.3   < > 4.6 4.2 4.2  CL 106   < > 104 103 106  CO2 25   < > _1 GLUCOSE 131*   < > 106* 94 101*  BUN 21   < > _2 CREATININE 1.51*   < > 1.23 0.96 1.12  CALCIUM 8.6*   < > 8.4* 8.3* 8.5*  PROT 6.4*  --   --   --   --   ALBUMIN 3.4*  --   --   --   --   AST 35  --   --   --   --   ALT 28  --   --   --   --   ALKPHOS 113  --   --   --   --   BILITOT 0.7  --   --   --   --   [facility]  44*   < > 57* >60 >60  GFRAA 51*   < > >60 >60 >60  ANIONGAP 12   < > _0 < > = values in this interval not displayed.     Hematology Recent Labs  Lab 11/04/19 0221 11/05/19 0733 11/06/19 0449  WBC 10.2 8.2 10.1  RBC 4.33 4.31 4.36  HGB 13.3 13.2 13.7  HCT 42.3 41.8 42.3  MCV 97.7 97.0 97.0  MCH 30.7 30.6 31.4  MCHC 31.4 31.6 32.4  RDW 14.0 14.0 14.2  PLT 203 186 196    Cardiac EnzymesNo results for input(s): TROPONINI in the last 168 hours. No results for input(s): TROPIPOC in the last 168 hours.   BNP Recent Labs  Lab 11/03/19 1855  BNP 224.8*     DDimer No results for input(s): DDIMER in the last 168 hours.   Radiology    ECHOCARDIOGRAM COMPLETE  Result Date: 11/04/2019    ECHOCARDIOGRAM REPORT   Patient Name:   TORRION WITTER Date of Exam: 11/04/2019 Medical Rec #:  [id number]       Height:       68.0 in Accession #:    [id number]      Weight:       255.0 lb Date of Birth:  1945/11/23       BSA:          2.266 m Patient Age:    69 years        BP:           102/73 mmHg Patient Gender: M               HR:           82 bpm. Exam Location:  Inpatient Procedure: 2D Echo, Cardiac Doppler, Color Doppler and Intracardiac            Opacification Agent Indications:    Atrial flutter  History:        Patient has prior history of Echocardiogram examinations, most                 recent 03/18/2018. Cardiomyopathy,  Previous Myocardial                 Infarction, COPD, Arrythmias:Atrial Fibrillation and Atrial                 Flutter; Risk Factors:Obesity. Covid.  Sonographer:    Dustin Flock Referring Phys: [place]  1. Left ventricular ejection fraction, by estimation, is 20 to 25%. The left ventricle has severely decreased function. The left ventricle demonstrates global hypokinesis. The left ventricular internal cavity size was moderately dilated. Left ventricular diastolic parameters are indeterminate.  2. Right ventricular systolic function is moderately reduced. The right ventricular size is moderately enlarged. There is mildly elevated pulmonary artery systolic pressure. The estimated right ventricular systolic pressure is 47.0 mmHg.  3. Left atrial size was mildly dilated.  4. Right atrial size was moderately dilated.  5. The mitral valve is normal in structure. Mild mitral valve regurgitation. No evidence of mitral stenosis.  6. The aortic valve is tricuspid. Aortic valve regurgitation is mild. No aortic stenosis is present.  7. The inferior vena cava is dilated in size with <50% respiratory variability, suggesting right atrial pressure of 15 mmHg. FINDINGS  Left Ventricle: Left ventricular ejection fraction, by estimation, is 20 to 25%. The left ventricle has severely decreased function. The left ventricle demonstrates global hypokinesis. Definity contrast  agent was given IV to delineate the left ventricular endocardial borders. The left ventricular internal cavity size was moderately dilated. There is no left ventricular hypertrophy. Left ventricular diastolic parameters are indeterminate. Right Ventricle: The right ventricular size is moderately enlarged. No increase in right ventricular wall thickness. Right ventricular systolic function is moderately reduced. There is mildly elevated pulmonary artery systolic pressure. The tricuspid regurgitant velocity is 2.40 m/s, and with an assumed  right atrial pressure of 15 mmHg, the estimated right ventricular systolic pressure is 66.5 mmHg. Left Atrium: Left atrial size was mildly dilated. Right Atrium: Right atrial size was moderately dilated. Pericardium: There is no evidence of pericardial effusion. Mitral Valve: The mitral valve is normal in structure. Mild mitral annular calcification. Mild mitral valve regurgitation. No evidence of mitral valve stenosis. Tricuspid Valve: The tricuspid valve is normal in structure. Tricuspid valve regurgitation is mild. Aortic Valve: The aortic valve is tricuspid. Aortic valve regurgitation is mild. Aortic regurgitation PHT measures 855 msec. No aortic stenosis is present. Pulmonic Valve: The pulmonic valve was normal in structure. Pulmonic valve regurgitation is trivial. Aorta: The aortic root is normal in size and structure. Venous: The inferior vena cava is dilated in size with less than 50% respiratory variability, suggesting right atrial pressure of 15 mmHg. IAS/Shunts: No atrial level shunt detected by color flow Doppler.  LEFT VENTRICLE PLAX 2D LVIDd:         6.30 cm  Diastology LVIDs:         4.90 cm  LV e' lateral:   11.50 cm/s LV PW:         1.10 cm  LV E/e' lateral: 6.2 LV IVS:        0.90 cm  LV e' medial:    7.72 cm/s LVOT diam:     2.60 cm  LV E/e' medial:  9.2 LV SV:         54 LV SV Index:   24 LVOT Area:     5.31 cm  RIGHT VENTRICLE RV Basal diam:  4.40 cm RV S prime:     10.10 cm/s TAPSE (M-mode): 3.0 cm LEFT ATRIUM             Index       RIGHT ATRIUM           Index LA diam:        4.80 cm 2.12 cm/m  RA Area:     28.20 cm LA Vol (A2C):   69.4 ml 30.63 ml/m RA Volume:   113.00 ml 49.87 ml/m LA Vol (A4C):   65.1 ml 28.73 ml/m LA Biplane Vol: 70.1 ml 30.94 ml/m  AORTIC VALVE LVOT Vmax:   54.20 cm/s LVOT Vmean:  34.500 cm/s LVOT VTI:    0.102 m AI PHT:      855 msec  AORTA Ao Root diam: 3.30 cm MITRAL VALVE               TRICUSPID VALVE MV Area (PHT): 4.31 cm    TR Peak grad:   23.0 mmHg MV  Decel Time: 176 msec    TR Vmax:        240.00 cm/s MV E velocity: 71.10 cm/s MV A velocity: 32.60 cm/s  SHUNTS MV E/A ratio:  2.18        Systemic VTI:  0.10 m  Systemic Diam: 2.60 cm Loralie Champagne MD Electronically signed by Loralie Champagne MD Signature Date/Time: 11/04/2019/5:35:14 PM    Final     Cardiac Studies   ECHO PENDING   1. Left ventricular ejection fraction, by estimation, is 20 to 25%. The  left ventricle has severely decreased function. The left ventricle  demonstrates global hypokinesis. The left ventricular internal cavity size  was moderately dilated. Left  ventricular diastolic parameters are indeterminate.  2. Right ventricular systolic function is moderately reduced. The right  ventricular size is moderately enlarged. There is mildly elevated  pulmonary artery systolic pressure. The estimated right ventricular  systolic pressure is 12.5 mmHg.  3. Left atrial size was mildly dilated.  4. Right atrial size was moderately dilated.  5. The mitral valve is normal in structure. Mild mitral valve  regurgitation. No evidence of mitral stenosis.  6. The aortic valve is tricuspid. Aortic valve regurgitation is mild. No  aortic stenosis is present.  7. The inferior vena cava is dilated in size with <50% respiratory  variability, suggesting right atrial pressure of 15 mmHg.   Patient Profile     74 y.o. male  with a hx of atrial fibrillation  who was seen earlier today for the evaluation of atrial flutter  at the request of Dr Sabra Heck.     Assessment & Plan    CARDIOMYOPATHY:  EF is markedly reversed compared with previous.   Will plan right and left heart cath.    Increase ARB as an out patient after we have had a chance to see his BP on the change in beta blocker below.  I would suggest ARNI as the initial drug but cost is a consideration.  He will be on beta blockers.    ATRIAL FLUTTER/FIB:    Started heparin with INR less than 2.  Start  Eliquis after cath.  I would like to try to get him home tonight as his wife has dementia and no other care giver at home.  Off of IV Cardizem.  Rate still somewhat elevated on beta blocker.  I will add digoxin and he can have a level drawn as an out patient.    PULMONARY HTN:   Left and right heart cath as above.    For questions or updates, please contact [employer] Please consult [email or web address] for contact info under Cardiology/STEMI.   Signed, Minus Breeding, MD  11/06/2019, 7:58 AM

## 2019-11-06 NOTE — H&P (View-Only) (Signed)
Progress Note  Patient Name: Ruben Murray Date of Encounter: 11/06/2019  Primary Cardiologist:   [name], MD   Subjective   No pain.  Baseline chronic SOB.    Inpatient Medications    Scheduled Meds: . amiodarone  200 mg Oral Daily  . aspirin EC  81 mg Oral Daily  . famotidine  20 mg Oral BID  . finasteride  5 mg Oral Q1200  . fluticasone  2 spray Each Nare BID  . loratadine  10 mg Oral Q1200  . losartan  50 mg Oral Daily  . metoprolol tartrate  50 mg Oral Q8H  . multivitamin with minerals  1 tablet Oral Q1200  . PHENobarbital  97.2 mg Oral QHS  . phenobarbital  97.2 mg Oral Once  . pravastatin  40 mg Oral QHS  . sodium chloride flush  3 mL Intravenous Q12H   Continuous Infusions: . sodium chloride    . sodium chloride 10 mL/hr at 11/06/19 0500  . heparin 1,350 Units/hr (11/05/19 2127)   PRN Meds: sodium chloride, acetaminophen **OR** acetaminophen, sodium chloride flush   Vital Signs    Vitals:   11/05/19 1944 11/06/19 0014 11/06/19 0436 11/06/19 0742  BP: 98/70 121/73 104/85 119/85  Pulse: 68 (!) 110 (!) 109 (!) 107  Resp: _0 Temp: 97.6 F (36.4 C) 98.2 F (36.8 C) 97.7 F (36.5 C) (!) 97.4 F (36.3 C)  TempSrc: Oral Oral Oral Oral  SpO2: 96% 95% 98% 97%  Weight:  116.3 kg    Height:        Intake/Output Summary (Last 24 hours) at 11/06/2019 0758 Last data filed at 11/06/2019 0016 Gross per 24 hour  Intake 940 ml  Output 100 ml  Net 840 ml   Filed Weights   11/04/19 1125 11/05/19 0419 11/06/19 0014  Weight: 115.7 kg 115.5 kg 116.3 kg    Telemetry    Atrial flutter with variable conduction. - Personally Reviewed  ECG    NA - Personally Reviewed  Physical Exam   GEN: No  acute distress.   Neck: No  JVD Cardiac: Irregular RR, no murmurs, rubs, or gallops.  Respiratory:    Few basilar crackles.  GI: Soft, nontender, non-distended, normal bowel sounds  MS:  No edema; No deformity. Neuro:   Nonfocal  Psych: Oriented and  appropriate    Labs    Chemistry Recent Labs  Lab 11/03/19 1855 11/03/19 1855 11/04/19 0221 11/05/19 0733 11/06/19 0449  NA 143   < > 140 138 139  K 4.3   < > 4.6 4.2 4.2  CL 106   < > 104 103 106  CO2 25   < > _1 GLUCOSE 131*   < > 106* 94 101*  BUN 21   < > _2 CREATININE 1.51*   < > 1.23 0.96 1.12  CALCIUM 8.6*   < > 8.4* 8.3* 8.5*  PROT 6.4*  --   --   --   --   ALBUMIN 3.4*  --   --   --   --   AST 35  --   --   --   --   ALT 28  --   --   --   --   ALKPHOS 113  --   --   --   --   BILITOT 0.7  --   --   --   --   [facility]  44*   < > 57* >60 >60  GFRAA 51*   < > >60 >60 >60  ANIONGAP 12   < > _0 < > = values in this interval not displayed.     Hematology Recent Labs  Lab 11/04/19 0221 11/05/19 0733 11/06/19 0449  WBC 10.2 8.2 10.1  RBC 4.33 4.31 4.36  HGB 13.3 13.2 13.7  HCT 42.3 41.8 42.3  MCV 97.7 97.0 97.0  MCH 30.7 30.6 31.4  MCHC 31.4 31.6 32.4  RDW 14.0 14.0 14.2  PLT 203 186 196    Cardiac EnzymesNo results for input(s): TROPONINI in the last 168 hours. No results for input(s): TROPIPOC in the last 168 hours.   BNP Recent Labs  Lab 11/03/19 1855  BNP 224.8*     DDimer No results for input(s): DDIMER in the last 168 hours.   Radiology    ECHOCARDIOGRAM COMPLETE  Result Date: 11/04/2019    ECHOCARDIOGRAM REPORT   Patient Name:   Ruben Murray Date of Exam: 11/04/2019 Medical Rec #:  [id number]       Height:       68.0 in Accession #:    [id number]      Weight:       255.0 lb Date of Birth:  1945/11/23       BSA:          2.266 m Patient Age:    74 years        BP:           102/73 mmHg Patient Gender: M               HR:           82 bpm. Exam Location:  Inpatient Procedure: 2D Echo, Cardiac Doppler, Color Doppler and Intracardiac            Opacification Agent Indications:    Atrial flutter  History:        Patient has prior history of Echocardiogram examinations, most                 recent 03/18/2018. Cardiomyopathy,  Previous Myocardial                 Infarction, COPD, Arrythmias:Atrial Fibrillation and Atrial                 Flutter; Risk Factors:Obesity. Covid.  Sonographer:    Dustin Flock Referring Phys: [place]  1. Left ventricular ejection fraction, by estimation, is 20 to 25%. The left ventricle has severely decreased function. The left ventricle demonstrates global hypokinesis. The left ventricular internal cavity size was moderately dilated. Left ventricular diastolic parameters are indeterminate.  2. Right ventricular systolic function is moderately reduced. The right ventricular size is moderately enlarged. There is mildly elevated pulmonary artery systolic pressure. The estimated right ventricular systolic pressure is 47.0 mmHg.  3. Left atrial size was mildly dilated.  4. Right atrial size was moderately dilated.  5. The mitral valve is normal in structure. Mild mitral valve regurgitation. No evidence of mitral stenosis.  6. The aortic valve is tricuspid. Aortic valve regurgitation is mild. No aortic stenosis is present.  7. The inferior vena cava is dilated in size with <50% respiratory variability, suggesting right atrial pressure of 15 mmHg. FINDINGS  Left Ventricle: Left ventricular ejection fraction, by estimation, is 20 to 25%. The left ventricle has severely decreased function. The left ventricle demonstrates global hypokinesis. Definity contrast  agent was given IV to delineate the left ventricular endocardial borders. The left ventricular internal cavity size was moderately dilated. There is no left ventricular hypertrophy. Left ventricular diastolic parameters are indeterminate. Right Ventricle: The right ventricular size is moderately enlarged. No increase in right ventricular wall thickness. Right ventricular systolic function is moderately reduced. There is mildly elevated pulmonary artery systolic pressure. The tricuspid regurgitant velocity is 2.40 m/s, and with an assumed  right atrial pressure of 15 mmHg, the estimated right ventricular systolic pressure is 66.5 mmHg. Left Atrium: Left atrial size was mildly dilated. Right Atrium: Right atrial size was moderately dilated. Pericardium: There is no evidence of pericardial effusion. Mitral Valve: The mitral valve is normal in structure. Mild mitral annular calcification. Mild mitral valve regurgitation. No evidence of mitral valve stenosis. Tricuspid Valve: The tricuspid valve is normal in structure. Tricuspid valve regurgitation is mild. Aortic Valve: The aortic valve is tricuspid. Aortic valve regurgitation is mild. Aortic regurgitation PHT measures 855 msec. No aortic stenosis is present. Pulmonic Valve: The pulmonic valve was normal in structure. Pulmonic valve regurgitation is trivial. Aorta: The aortic root is normal in size and structure. Venous: The inferior vena cava is dilated in size with less than 50% respiratory variability, suggesting right atrial pressure of 15 mmHg. IAS/Shunts: No atrial level shunt detected by color flow Doppler.  LEFT VENTRICLE PLAX 2D LVIDd:         6.30 cm  Diastology LVIDs:         4.90 cm  LV e' lateral:   11.50 cm/s LV PW:         1.10 cm  LV E/e' lateral: 6.2 LV IVS:        0.90 cm  LV e' medial:    7.72 cm/s LVOT diam:     2.60 cm  LV E/e' medial:  9.2 LV SV:         54 LV SV Index:   24 LVOT Area:     5.31 cm  RIGHT VENTRICLE RV Basal diam:  4.40 cm RV S prime:     10.10 cm/s TAPSE (M-mode): 3.0 cm LEFT ATRIUM             Index       RIGHT ATRIUM           Index LA diam:        4.80 cm 2.12 cm/m  RA Area:     28.20 cm LA Vol (A2C):   69.4 ml 30.63 ml/m RA Volume:   113.00 ml 49.87 ml/m LA Vol (A4C):   65.1 ml 28.73 ml/m LA Biplane Vol: 70.1 ml 30.94 ml/m  AORTIC VALVE LVOT Vmax:   54.20 cm/s LVOT Vmean:  34.500 cm/s LVOT VTI:    0.102 m AI PHT:      855 msec  AORTA Ao Root diam: 3.30 cm MITRAL VALVE               TRICUSPID VALVE MV Area (PHT): 4.31 cm    TR Peak grad:   23.0 mmHg MV  Decel Time: 176 msec    TR Vmax:        240.00 cm/s MV E velocity: 71.10 cm/s MV A velocity: 32.60 cm/s  SHUNTS MV E/A ratio:  2.18        Systemic VTI:  0.10 m  Systemic Diam: 2.60 cm Loralie Champagne MD Electronically signed by Loralie Champagne MD Signature Date/Time: 11/04/2019/5:35:14 PM    Final     Cardiac Studies   ECHO PENDING   1. Left ventricular ejection fraction, by estimation, is 20 to 25%. The  left ventricle has severely decreased function. The left ventricle  demonstrates global hypokinesis. The left ventricular internal cavity size  was moderately dilated. Left  ventricular diastolic parameters are indeterminate.  2. Right ventricular systolic function is moderately reduced. The right  ventricular size is moderately enlarged. There is mildly elevated  pulmonary artery systolic pressure. The estimated right ventricular  systolic pressure is 29.5 mmHg.  3. Left atrial size was mildly dilated.  4. Right atrial size was moderately dilated.  5. The mitral valve is normal in structure. Mild mitral valve  regurgitation. No evidence of mitral stenosis.  6. The aortic valve is tricuspid. Aortic valve regurgitation is mild. No  aortic stenosis is present.  7. The inferior vena cava is dilated in size with <50% respiratory  variability, suggesting right atrial pressure of 15 mmHg.   Patient Profile     74 y.o. male  with a hx of atrial fibrillation  who was seen earlier today for the evaluation of atrial flutter  at the request of Dr Sabra Heck.     Assessment & Plan    CARDIOMYOPATHY:  EF is markedly reversed compared with previous.   Will plan right and left heart cath.    Start ARB as an out patient after we have had a chance to see his BP on the change in beta blocker below.  I would suggest ARNI as the initial drug but cost is a consideration.  He will be on beta blockers.    ATRIAL FLUTTER/FIB:    Started heparin with INR less than 2.  Start Eliquis  after cath.  I would like to try to get him home tonight as his wife has dementia and no other care giver at home.  Off of IV Cardizem.  Rate still somewhat elevated on beta blocker.  I will add digoxin and he can have a level drawn as an out patient.    PULMONARY HTN:   Left and right heart cath as above.    For questions or updates, please contact [place] Please consult [email or web address] for contact info under Cardiology/STEMI.   Signed, Minus Breeding, MD  11/06/2019, 7:58 AM

## 2019-11-06 NOTE — Discharge Summary (Signed)
Physician Discharge Summary  Ruben Murray QMV:[id number] DOB: 21-Jul-1945 DOA: 11/03/2019  PCP: [name], MD  Admit date: 11/03/2019 Discharge date: 11/06/2019  Admitted From: Home Discharge disposition: Home   Code Status: DNR  Diet Recommendation: Cardiac diet  Discharge Diagnosis:   Principal Problem:   Atrial flutter with rapid ventricular response ([place]) Active Problems:   Essential hypertension   Seizures ([place])   Moderate persistent asthma   COPD GOLD II    Morbid (severe) obesity due to excess calories (HCC)   Acquired thrombophilia ([place])   Atrial flutter ([place])   Dilated cardiomyopathy ([place])  History of Present Illness / Brief narrative:  Ruben Murray is a 74 y.o. male with PMH of nonischemic cardiomyopathy last EF measured in January 2020 was 40 to 45% with history of atrial fibrillation,multiple cardioversion,presently on Coumadin and metoprolol,history of seizures,recently diagnosed with COVID-19 infection, completed quarantine for 10 days at home. Patient presented to the ED on 11/03/2019 with dizziness and near syncope. EMS noted tachycardia and brought him to the ED.    In the ED, patient was noted to be in a flutter with RVR and was started on Cardizem drip.   Labs showed BNP elevated to 224, troponin elevated to 539, creatinine 1.5, INR 2.7 on Coumadin. Patient was admitted under hospitalist service.  Cardiology consultation was obtained.  Subjective:  Patient was seen and examined briefly this morning Not in distress.  No new symptoms.  Hospital Course:  Atrial flutter with RVR -Presented with syncope, was in A. fib with RVR on presentation.  -Initially started on Cardizem drip. Cardiology following.  -Currently he is on oral amiodarone, oral metoprolol and oral digoxin. -Previously he was on Coumadin at home.  -Per cardiology, will discharge him home on Eliquis. Aspirin will be stopped.  NSTEMI  -Troponin elevated and peaked at  4176. -Patient underwent cardiac cath this morning.  Nonischemic cardiomyopathy -EF 20 to 25% Pulmonary hypertension Essential hypertension -Echo 8/31 with LVEF 20 to 25%, global hypokinesis. -Not actively having chest pain. -Continue metoprolol, losartan, Eliquis and statin at this time.   Hyperlipidemia -Continue Pravachol  Recent Covid 19 infection -Testedpositive on8/20 -Now asymptomatic and completed home quarantine for 10 days  History of seizures -Continue phenobarbital  AKI on CKD stage IIIa -Resolved Recent Labs    01/15/19 1130 03/11/19 1222 04/11/19 0802 08/31/19 2022 08/31/19 2152 11/03/19 1855 11/04/19 0221 11/05/19 0733 11/06/19 0449  CREATININE 1.08 1.03 1.00 1.35* 1.50* 1.51* 1.23 0.96 1.12   Wound care:    Discharge Exam:   Vitals:   11/06/19 1130 11/06/19 1132 11/06/19 1150 11/06/19 1200  BP: 105/71 105/76 114/77 113/79  Pulse: 83 69 86 81  Resp:    14  Temp:    97.7 F (36.5 C)  TempSrc:    Oral  SpO2:    97%  Weight:      Height:        Body mass index is 38.97 kg/m.  General exam: Appears calm and comfortable.  Not in distress Skin: No rashes, lesions or ulcers. HEENT: Atraumatic, normocephalic, supple neck, no obvious bleeding Lungs: Clear to auscultation bilaterally CVS: Regular rate and rhythm, no murmur GI/Abd soft, nontender, nondistended, bowel sound present CNS: Alert, awake, oriented x3 Psychiatry: Mood appropriate Extremities: No pedal edema, no calf tenderness  Follow ups:   Discharge Instructions    Diet - low sodium heart healthy   Complete by: As directed    Increase activity slowly   Complete by: As  directed       Follow-up Information    [name], MD Follow up.   Specialty: Family Medicine Contact information: [place] [address] [phone]        [name], MD .   Specialty: Cardiology Contact information: [phone]  [address] [phone]        [name], MD .   Specialty: Cardiology Contact information: [place] [address] [phone]               Recommendations for Outpatient Follow-Up:   1. Follow-up with PCP as an outpatient 2. Follow-up with cardiology as an outpatient  Discharge Instructions:  Follow with Primary MD [name], MD in 7 days   Get CBC/BMP checked in next visit within 1 week by PCP or SNF MD ( we routinely change or add medications that can affect your baseline labs and fluid status, therefore we recommend that you get the mentioned basic workup next visit with your PCP, your PCP may decide not to get them or add new tests based on their clinical decision)  On your next visit with your PCP, please Get Medicines reviewed and adjusted.  Please request your PCP  to go over all Hospital Tests and Procedure/Radiological results at the follow up, please get all Hospital records sent to your Prim MD by signing hospital release before you go home.  Activity: As tolerated with Full fall precautions use walker/cane & assistance as needed  For Heart failure patients - Check your Weight same time everyday, if you gain over 2 pounds, or you develop in leg swelling, experience more shortness of breath or chest pain, call your Primary MD immediately. Follow Cardiac Low Salt Diet and 1.5 lit/day fluid restriction.  If you have smoked or chewed Tobacco in the last 2 yrs please stop smoking, stop any regular Alcohol  and or any Recreational drug use.  If you experience worsening of your admission symptoms, develop shortness of breath, life threatening emergency, suicidal or homicidal thoughts you must seek medical attention immediately by calling 911 or calling your MD immediately  if symptoms less severe.  You Must read complete instructions/literature along with all the possible adverse reactions/side effects for all the Medicines you take  and that have been prescribed to you. Take any new Medicines after you have completely understood and accpet all the possible adverse reactions/side effects.   Do not drive, operate heavy machinery, perform activities at heights, swimming or participation in water activities or provide baby sitting services if your were admitted for syncope or siezures until you have seen by Primary MD or a Neurologist and advised to do so again.  Do not drive when taking Pain medications.  Do not take more than prescribed Pain, Sleep and Anxiety Medications  Wear Seat belts while driving.   Please note You were cared for by a hospitalist during your hospital stay. If you have any questions about your discharge medications or the care you received while you were in the hospital after you are discharged, you can call the unit and asked to speak with the hospitalist on call if the hospitalist that took care of you is not available. Once you are discharged, your primary care physician will handle any further medical issues. Please note that NO REFILLS for any discharge medications will be authorized once you are discharged, as it is imperative that you return to your  primary care physician (or establish a relationship with a primary care physician if you do not have one) for your aftercare needs so that they can reassess your need for medications and monitor your lab values.    Allergies as of 11/06/2019      Reactions   Dilaudid [hydromorphone Hcl] Other (See Comments)   Makes pt hyper    Dilaudid [hydromorphone] Other (See Comments)   hyperactiviity   Tegretol [carbamazepine] Hives   Tegretol [carbamazepine] Hives      Medication List    STOP taking these medications   furosemide 40 MG tablet Commonly known as: LASIX   potassium chloride 10 MEQ tablet Commonly known as: KLOR-CON   warfarin 3 MG tablet Commonly known as: COUMADIN     TAKE these medications   acetaminophen 500 MG tablet Commonly  known as: TYLENOL Take 500-1,000 mg by mouth daily as needed for moderate pain.   amiodarone 200 MG tablet Commonly known as: PACERONE Take 1 tablet (200 mg total) by mouth daily.   apixaban 5 MG Tabs tablet Commonly known as: ELIQUIS Take 1 tablet (5 mg total) by mouth 2 (two) times daily.   ARTIFICIAL TEAR SOLUTION OP Place 1 drop into both eyes 2 (two) times daily.   Artificial Tears 1.4 % ophthalmic solution Generic drug: polyvinyl alcohol Place 1 drop into both eyes daily with lunch.   digoxin 0.25 MG tablet Commonly known as: LANOXIN Take 1 tablet (0.25 mg total) by mouth daily. Start taking on: November 07, 2019   famotidine 20 MG tablet Commonly known as: Pepcid One at bedtime What changed:   how much to take  how to take this  when to take this   finasteride 5 MG tablet Commonly known as: PROSCAR Take 5 mg by mouth daily at 12 noon.   fluticasone 50 MCG/ACT nasal spray Commonly known as: FLONASE Place 2 sprays into both nostrils 2 (two) times daily.   loratadine 10 MG tablet Commonly known as: CLARITIN Take 10 mg by mouth daily at 12 noon.   losartan 50 MG tablet Commonly known as: COZAAR Take 50 mg by mouth daily with lunch.   metoprolol succinate 100 MG 24 hr tablet Commonly known as: TOPROL-XL Take 2 tablets (200 mg total) by mouth daily. What changed: how much to take   multivitamin with minerals Tabs tablet Take 1 tablet by mouth daily at 12 noon.   PHENobarbital 97.2 MG tablet Commonly known as: LUMINAL Take 97.2 mg by mouth at bedtime.   pravastatin 40 MG tablet Commonly known as: PRAVACHOL Take 40 mg by mouth at bedtime.       Time coordinating discharge: 35 minutes  The results of significant diagnostics from this hospitalization (including imaging, microbiology, ancillary and laboratory) are listed below for reference.    Procedures and Diagnostic Studies:   DG Chest Port 1 View  Result Date: 11/03/2019 CLINICAL DATA:   Arrhythmia with history of COVID positive. EXAM: PORTABLE CHEST 1 VIEW COMPARISON:  August 31, 2019 FINDINGS: A very mild amount of atelectasis is seen within the left lung base. There is no evidence of a pleural effusion or pneumothorax. The cardiac silhouette is mildly enlarged. The visualized skeletal structures are unremarkable. IMPRESSION: 1. Very mild amount of left basilar atelectasis. 2. Mild cardiomegaly. Electronically Signed   By: Virgina Norfolk M.D.   On: 11/03/2019 19:22   ECHOCARDIOGRAM COMPLETE  Result Date: 11/04/2019    ECHOCARDIOGRAM REPORT   Patient Name:   Ruben Murray  Date of Exam: 11/04/2019 Medical Rec #:  [id number]       Height:       68.0 in Accession #:    [id number]      Weight:       255.0 lb Date of Birth:  01/27/1946       BSA:          2.266 m Patient Age:    35 years        BP:           102/73 mmHg Patient Gender: M               HR:           82 bpm. Exam Location:  Inpatient Procedure: 2D Echo, Cardiac Doppler, Color Doppler and Intracardiac            Opacification Agent Indications:    Atrial flutter  History:        Patient has prior history of Echocardiogram examinations, most                 recent 03/18/2018. Cardiomyopathy, Previous Myocardial                 Infarction, COPD, Arrythmias:Atrial Fibrillation and Atrial                 Flutter; Risk Factors:Obesity. Covid.  Sonographer:    Dustin Flock Referring Phys: [place]  1. Left ventricular ejection fraction, by estimation, is 20 to 25%. The left ventricle has severely decreased function. The left ventricle demonstrates global hypokinesis. The left ventricular internal cavity size was moderately dilated. Left ventricular diastolic parameters are indeterminate.  2. Right ventricular systolic function is moderately reduced. The right ventricular size is moderately enlarged. There is mildly elevated pulmonary artery systolic pressure. The estimated right ventricular systolic pressure is  21.1 mmHg.  3. Left atrial size was mildly dilated.  4. Right atrial size was moderately dilated.  5. The mitral valve is normal in structure. Mild mitral valve regurgitation. No evidence of mitral stenosis.  6. The aortic valve is tricuspid. Aortic valve regurgitation is mild. No aortic stenosis is present.  7. The inferior vena cava is dilated in size with <50% respiratory variability, suggesting right atrial pressure of 15 mmHg. FINDINGS  Left Ventricle: Left ventricular ejection fraction, by estimation, is 20 to 25%. The left ventricle has severely decreased function. The left ventricle demonstrates global hypokinesis. Definity contrast agent was given IV to delineate the left ventricular endocardial borders. The left ventricular internal cavity size was moderately dilated. There is no left ventricular hypertrophy. Left ventricular diastolic parameters are indeterminate. Right Ventricle: The right ventricular size is moderately enlarged. No increase in right ventricular wall thickness. Right ventricular systolic function is moderately reduced. There is mildly elevated pulmonary artery systolic pressure. The tricuspid regurgitant velocity is 2.40 m/s, and with an assumed right atrial pressure of 15 mmHg, the estimated right ventricular systolic pressure is 94.1 mmHg. Left Atrium: Left atrial size was mildly dilated. Right Atrium: Right atrial size was moderately dilated. Pericardium: There is no evidence of pericardial effusion. Mitral Valve: The mitral valve is normal in structure. Mild mitral annular calcification. Mild mitral valve regurgitation. No evidence of mitral valve stenosis. Tricuspid Valve: The tricuspid valve is normal in structure. Tricuspid valve regurgitation is mild. Aortic Valve: The aortic valve is tricuspid. Aortic valve regurgitation is mild. Aortic regurgitation PHT measures 855 msec. No aortic stenosis is present. Pulmonic Valve: The  pulmonic valve was normal in structure. Pulmonic valve  regurgitation is trivial. Aorta: The aortic root is normal in size and structure. Venous: The inferior vena cava is dilated in size with less than 50% respiratory variability, suggesting right atrial pressure of 15 mmHg. IAS/Shunts: No atrial level shunt detected by color flow Doppler.  LEFT VENTRICLE PLAX 2D LVIDd:         6.30 cm  Diastology LVIDs:         4.90 cm  LV e' lateral:   11.50 cm/s LV PW:         1.10 cm  LV E/e' lateral: 6.2 LV IVS:        0.90 cm  LV e' medial:    7.72 cm/s LVOT diam:     2.60 cm  LV E/e' medial:  9.2 LV SV:         54 LV SV Index:   24 LVOT Area:     5.31 cm  RIGHT VENTRICLE RV Basal diam:  4.40 cm RV S prime:     10.10 cm/s TAPSE (M-mode): 3.0 cm LEFT ATRIUM             Index       RIGHT ATRIUM           Index LA diam:        4.80 cm 2.12 cm/m  RA Area:     28.20 cm LA Vol (A2C):   69.4 ml 30.63 ml/m RA Volume:   113.00 ml 49.87 ml/m LA Vol (A4C):   65.1 ml 28.73 ml/m LA Biplane Vol: 70.1 ml 30.94 ml/m  AORTIC VALVE LVOT Vmax:   54.20 cm/s LVOT Vmean:  34.500 cm/s LVOT VTI:    0.102 m AI PHT:      855 msec  AORTA Ao Root diam: 3.30 cm MITRAL VALVE               TRICUSPID VALVE MV Area (PHT): 4.31 cm    TR Peak grad:   23.0 mmHg MV Decel Time: 176 msec    TR Vmax:        240.00 cm/s MV E velocity: 71.10 cm/s MV A velocity: 32.60 cm/s  SHUNTS MV E/A ratio:  2.18        Systemic VTI:  0.10 m                            Systemic Diam: 2.60 cm Loralie Champagne MD Electronically signed by Loralie Champagne MD Signature Date/Time: 11/04/2019/5:35:14 PM    Final      Labs:   Basic Metabolic Panel: Recent Labs  Lab 11/03/19 1855 11/03/19 1855 11/04/19 0221 11/04/19 0221 11/05/19 0733 11/06/19 0449  NA 143  --  140  --  138 139  K 4.3   < > 4.6   < > 4.2 4.2  CL 106  --  104  --  103 106  CO2 25  --  27  --  26 24  GLUCOSE 131*  --  106*  --  94 101*  BUN 21  --  20  --  20 17  CREATININE 1.51*  --  1.23  --  0.96 1.12  CALCIUM 8.6*  --  8.4*  --  8.3* 8.5*  MG  --   --    --   --   --  2.0   < > = values in this interval not displayed.  GFR Estimated Creatinine Clearance: 69.5 mL/min (by C-G formula based on SCr of 1.12 mg/dL). Liver Function Tests: Recent Labs  Lab 11/03/19 1855  AST 35  ALT 28  ALKPHOS 113  BILITOT 0.7  PROT 6.4*  ALBUMIN 3.4*   No results for input(s): LIPASE, AMYLASE in the last 168 hours. No results for input(s): AMMONIA in the last 168 hours. Coagulation profile Recent Labs  Lab 11/03/19 1855 11/04/19 1634 11/05/19 0733 11/05/19 1842 11/06/19 0449  INR 2.7* 3.3* 2.0* 1.5* 1.4*    CBC: Recent Labs  Lab 11/03/19 1855 11/04/19 0221 11/05/19 0733 11/06/19 0449  WBC 10.5 10.2 8.2 10.1  NEUTROABS 8.8*  --   --  5.9  HGB 13.8 13.3 13.2 13.7  HCT 44.6 42.3 41.8 42.3  MCV 98.2 97.7 97.0 97.0  PLT 198 203 186 196   Cardiac Enzymes: No results for input(s): CKTOTAL, CKMB, CKMBINDEX, TROPONINI in the last 168 hours. BNP: Invalid input(s): POCBNP CBG: No results for input(s): GLUCAP in the last 168 hours. D-Dimer No results for input(s): DDIMER in the last 72 hours. Hgb A1c Recent Labs    11/05/19 0733  HGBA1C 6.2*   Lipid Profile Recent Labs    11/04/19 0214  CHOL 114  HDL 34*  LDLCALC 68  TRIG 60  CHOLHDL 3.4   Thyroid function studies Recent Labs    11/03/19 2345  TSH 0.642   Anemia work up No results for input(s): VITAMINB12, FOLATE, FERRITIN, TIBC, IRON, RETICCTPCT in the last 72 hours. Microbiology No results found for this or any previous visit (from the past 240 hour(s)).   Signed: Terrilee Croak  [facility] 11/06/2019, 3:30 PM

## 2019-11-06 NOTE — Progress Notes (Signed)
D/C instructions given and reviewed. Tele and IV removed. Tolerated well.

## 2019-11-06 NOTE — Interval H&P Note (Signed)
Cath Lab Visit (complete for each Cath Lab visit)  Clinical Evaluation Leading to the Procedure:   ACS: No.  Non-ACS:    Anginal Classification: CCS II  Anti-ischemic medical therapy: Minimal Therapy (1 class of medications)  Non-Invasive Test Results: No non-invasive testing performed  Prior CABG: No previous CABG      History and Physical Interval Note:  11/06/2019 9:52 AM  Ruben Murray  has presented today for surgery, with the diagnosis of nstemi.  The various methods of treatment have been discussed with the patient and family. After consideration of risks, benefits and other options for treatment, the patient has consented to  Procedure(s): RIGHT/LEFT HEART CATH AND CORONARY ANGIOGRAPHY (N/A) as a surgical intervention.  The patient's history has been reviewed, patient examined, no change in status, stable for surgery.  I have reviewed the patient's chart and labs.  Questions were answered to the patient's satisfaction.     Shelva Majestic

## 2019-11-06 NOTE — Discharge Instructions (Signed)

## 2019-11-06 NOTE — Progress Notes (Signed)
ANTICOAGULATION CONSULT NOTE  - Follow Up Consultation Pharmacy Consult for Heparin, Start When INR <2.0 Indication: atrial fibrillation  Allergies  Allergen Reactions  . Dilaudid [Hydromorphone Hcl] Other (See Comments)    Makes pt hyper   . Dilaudid [Hydromorphone] Other (See Comments)    hyperactiviity  . Tegretol [Carbamazepine] Hives  . Tegretol [Carbamazepine] Hives    Patient Measurements: Height: _0  (172.7 cm) Weight: 116.3 kg (256 lb 4.8 oz) IBW/kg (Calculated) : 68.4 Heparin Dosing Weight: 95.2 kg  Vital Signs: Temp: 97.7 F (36.5 C) (09/02 0436) Temp Source: Oral (09/02 0436) BP: 104/85 (09/02 0436) Pulse Rate: 109 (09/02 0436)  Labs: Recent Labs     0000 11/03/19 1855 11/03/19 2346 11/04/19 0221 11/04/19 0532 11/04/19 1634 11/05/19 0733 11/05/19 1842 11/06/19 0449  HGB   < >   < >  --  13.3  --   --  13.2  --  13.7  HCT  --    < >  --  42.3  --   --  41.8  --  42.3  PLT  --    < >  --  203  --   --  186  --  196  LABPROT  --   --   --   --   --    < > 22.1* 17.8* 16.4*  INR  --   --   --   --   --    < > 2.0* 1.5* 1.4*  HEPARINUNFRC  --   --   --   --   --   --   --   --  0.31  CREATININE  --    < >  --  1.23  --   --  0.96  --  1.12  TROPONINIHS  --   --  7,673* 4,176* 3,759*  --   --   --   --    < > = values in this interval not displayed.    Estimated Creatinine Clearance: 69.5 mL/min (by C-G formula based on SCr of 1.12 mg/dL).   Medical History: Past Medical History:  Diagnosis Date  . Acquired thrombophilia ([place]) 01/15/2019  . Aortic atherosclerosis ([place])   . Atrial fibrillation with RVR ([place]) 10/29/2017  . CAP (community acquired pneumonia) 03/06/2016  . Cardiomyopathy ([place])    a. EF 40-45% in 04/2016, etiology not defined, managed medically.  . CHF exacerbation ([place]) 10/29/2017  . Chronic laryngitis 10/03/2016  . COPD GOLD II  08/02/2016   Quit smoking 2000  PFT's  07/10/2016  FEV1 1.98 (70 % ) ratio 67  p 19 % improvement from saba p  nothing  prior to study while of coreg so rec as of 08/02/2016 try off coreg and on bisoprolol     . Dysphonia 10/03/2016  . Essential hypertension 03/06/2016   Changed from coreg to bisoprolol due to copd with reversible component  08/02/2016 >>>   . Fever 03/12/2015  . History of cardioversion   . Hoarseness 08/31/2016   Referred to ent 08/31/2016 >>> seen 10/03/16 [name] dx gerd and rhinitis medicamentosa   > improved on f/u 01/31/17 on gerd rx/ flonase and off afrin  . Hyperlipidemia   . Hypertension   . Laryngopharyngeal reflux (LPR) 10/03/2016  . Moderate persistent asthma 08/02/2016  . Morbid (severe) obesity due to excess calories ([place]) 08/02/2016  . Myocardial infarction [facility]) 1980-early 2000s X 2   "mild ones" (03/06/2016)  . Nasal polyps 10/03/2016  . OSA  on CPAP    a. pt states was told by Dr. Maxwell Caul that he would not require CPAP as long as he continued to sleep in recliner which he does for his back.  . Paroxysmal atrial fibrillation ([place])   . Pneumonia    "when I was a kid" (03/06/2016)  . Prolonged QT interval 10/29/2017  . RBBB   . 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 Rhinitis medicamentosa 10/03/2016  . Right thyroid nodule 03/06/2016  . Seizures ([place])    "take RX daily" (03/06/2016)  . Sensorineural hearing loss (SNHL) of both ears 01/31/2017    Assessment: 74 y.o. male was admitted with atrial flutter, hx of atrial fibrillation; warfarin on hold. Pharmacy was consulted to dose IV heparin once INR < 2.  INR was 2.7 on admission 8/31; S/P vitamin K 5 mg PO X 1 on 8/31; INR was 3.3 on 8/31 PM. INR this AM (9/1) is 2.0. Pt rec'd second dose of vitamin K 5 mg PO X 1 earlier today; INR this evening is down to 1.5. Cardiac cath is scheduled for 11/06/19.  AM Heparin level within goal range.  CBC WNL  Goal of Therapy:  Heparin level 0.3-0.7 units/ml Monitor platelets by anticoagulation protocol: Yes   Plan:  Continue IV heparin at current rate Recheck heparin level in 8  hrs. Monitor daily heparin level, INR, CBC Monitor for signs/symptoms of bleeding Cardiologist noted plan is to start apixaban at discharge   [place] Crane, Vena Austria, BCPS, [place] Pharmacist  11/06/2019 6:02 AM   [facility] pharmacy phone numbers are listed on [email or web address]

## 2019-11-06 NOTE — Progress Notes (Signed)
Pt. Refusing to sign consent for scheduled procedure. Cath lab called to make aware.

## 2019-11-06 NOTE — Progress Notes (Signed)
SATURATION QUALIFICATIONS: (This note is used to comply with regulatory documentation for home oxygen)  Patient Saturations on Room Air at Rest = 97 %  Patient Saturations on Room Air while Ambulating = 95%    Please briefly explain why patient needs home oxygen: patient does not need home oxygen

## 2019-11-06 NOTE — Plan of Care (Signed)
  Problem: Safety: Goal: Ability to remain free from injury will improve Outcome: Progressing

## 2019-11-07 ENCOUNTER — Other Ambulatory Visit: Payer: Self-pay

## 2019-11-07 NOTE — Patient Outreach (Signed)
[facility]) Care Management  11/07/2019  Ruben Murray April 29, 1945 [id number]   Telephone call to patient for Hospitalization follow up.   No answer. Voice mailbox full.     Plan: If no return call, RN CM will attempt patient again within 4 business days.  [name], RN, MSN [place] Management Care Management Coordinator Direct Line [phone] Cell [phone] Toll Free: [phone]  Fax: [phone]

## 2019-11-11 ENCOUNTER — Other Ambulatory Visit: Payer: Self-pay

## 2019-11-11 DIAGNOSIS — Z9289 Personal history of other medical treatment: Secondary | ICD-10-CM | POA: Diagnosis not present

## 2019-11-11 DIAGNOSIS — I480 Paroxysmal atrial fibrillation: Secondary | ICD-10-CM | POA: Diagnosis not present

## 2019-11-11 DIAGNOSIS — G40909 Epilepsy, unspecified, not intractable, without status epilepticus: Secondary | ICD-10-CM | POA: Diagnosis not present

## 2019-11-11 DIAGNOSIS — Z7901 Long term (current) use of anticoagulants: Secondary | ICD-10-CM | POA: Diagnosis not present

## 2019-11-11 DIAGNOSIS — I10 Essential (primary) hypertension: Secondary | ICD-10-CM | POA: Diagnosis not present

## 2019-11-11 NOTE — Patient Outreach (Signed)
[facility]) Care Management  [place]  11/11/2019   Ruben Murray 1946/02/06 [id number]  Subjective: Telephone call to patient for follow up from recent admission 11-03-19 to 11/06/19. Patient reports that he is better and that he had a slight heart attack.  He states that he has seen his PCP this morning and will be being scheduled to see cardiologist soon. Patient reports that the was switched to Eliquis but he states is too expensive. He states he is not sure why they switched him when he was already on coumadin. He states his PCP switched him back today and he will take his coumadin 3 mg daily.  Discussed signs of heart attack and need for immediate 911 call. He verbalized understanding.  Advised patient that CM would check in more often for a few weeks due to hospitalization.  Discussed goal of not wanting to readmit to the hospital and importance of follow up appointments. He verbalized understanding and voices no concerns.    Objective:   Encounter Medications:  Outpatient Encounter Medications as of 11/11/2019  Medication Sig Note  . acetaminophen (TYLENOL) 500 MG tablet Take 500-1,000 mg by mouth daily as needed for moderate pain.    Marland Kitchen amiodarone (PACERONE) 200 MG tablet Take 1 tablet (200 mg total) by mouth daily.   . ARTIFICIAL TEAR SOLUTION OP Place 1 drop into both eyes 2 (two) times daily.   . digoxin (LANOXIN) 0.25 MG tablet Take 1 tablet (0.25 mg total) by mouth daily.   . famotidine (PEPCID) 20 MG tablet One at bedtime (Patient taking differently: Take 20 mg by mouth at bedtime. One at bedtime)   . finasteride (PROSCAR) 5 MG tablet Take 5 mg by mouth daily at 12 noon.    . fluticasone (FLONASE) 50 MCG/ACT nasal spray Place 2 sprays into both nostrils 2 (two) times daily.   Marland Kitchen loratadine (CLARITIN) 10 MG tablet Take 10 mg by mouth daily at 12 noon.    Marland Kitchen losartan (COZAAR) 50 MG tablet Take 50 mg by mouth daily with lunch.   . metoprolol succinate  (TOPROL-XL) 100 MG 24 hr tablet Take 2 tablets (200 mg total) by mouth daily.   . Multiple Vitamin (MULTIVITAMIN WITH MINERALS) TABS tablet Take 1 tablet by mouth daily at 12 noon.   Marland Kitchen PHENobarbital (LUMINAL) 97.2 MG tablet Take 97.2 mg by mouth at bedtime.    . polyvinyl alcohol (ARTIFICIAL TEARS) 1.4 % ophthalmic solution Place 1 drop into both eyes daily with lunch.   . pravastatin (PRAVACHOL) 40 MG tablet Take 40 mg by mouth at bedtime.    Marland Kitchen apixaban (ELIQUIS) 5 MG TABS tablet Take 1 tablet (5 mg total) by mouth 2 (two) times daily. (Patient not taking: Reported on 11/11/2019) 11/11/2019: Patient taking coumadin 3 mg Tab daily.   No facility-administered encounter medications on file as of 11/11/2019.    Functional Status:  In your present state of health, do you have any difficulty performing the following activities: 11/11/2019 06/12/2019  Hearing? Y Y  Comment trouble hearing at times trouble hearing at times  Vision? N N  Difficulty concentrating or making decisions? N N  Walking or climbing stairs? Y Y  Comment knee problems knee problems  Dressing or bathing? N N  Doing errands, shopping? N N  Preparing Food and eating ? N N  Using the Toilet? N N  In the past six months, have you accidently leaked urine? N N  Do you have problems with  loss of bowel control? N N  Managing your Medications? N N  Managing your Finances? N N  Housekeeping or managing your Housekeeping? N N  Some recent data might be hidden    Fall/Depression Screening: Fall Risk  11/11/2019 09/12/2019 06/12/2019  Falls in the past year? 0 0 0  Number falls in past yr: - - -  Injury with Fall? - - -  Risk for fall due to : - - -  Follow up - - -   PHQ 2/9 Scores 11/11/2019 06/12/2019 06/07/2018 02/18/2018 11/15/2017  PHQ - 2 Score 0 0 0 1 0   SDOH Screenings   Alcohol Screen:   . Last Alcohol Screening Score (AUDIT): Not on file  Depression (PHQ2-9): Low Risk   . PHQ-2 Score: 0  Financial Resource Strain:   .  Difficulty of Paying Living Expenses: Not on file  Food Insecurity: No Food Insecurity  . Worried About Charity fundraiser in the Last Year: Never true  . Ran Out of Food in the Last Year: Never true  Housing: Low Risk   . Last Housing Risk Score: 0  Physical Activity:   . Days of Exercise per Week: Not on file  . Minutes of Exercise per Session: Not on file  Social Connections:   . Frequency of Communication with Friends and Family: Not on file  . Frequency of Social Gatherings with Friends and Family: Not on file  . Attends Religious Services: Not on file  . Active Member of Clubs or Organizations: Not on file  . Attends Archivist Meetings: Not on file  . Marital Status: Not on file  Stress:   . Feeling of Stress : Not on file  Tobacco Use: Medium Risk  . Smoking Tobacco Use: Former Smoker  . Smokeless Tobacco Use: Never Used  Transportation Needs: No Transportation Needs  . Lack of Transportation (Medical): No  . Lack of Transportation (Non-Medical): No     Assessment: Patient continues post-hospital recovery.  Patient scheduled to see physicians for follow up.    Plan:  [facility] CM Care Plan Problem One     Most Recent Value  Care Plan Problem One Continued learning related to diagnosis of heart failure and management of atrial fibrillation  Role Documenting the Problem One Care Management Telephonic Coordinator  Care Plan for Problem One Active  [facility] Long Term Goal  Patient will report no A. Fibrillation and will continue to watch for signs of Heart Failure within 90 days.  [facility] Long Term Goal Start Date 11/11/19  Interventions for Problem One Long Term Goal Patient contines to weigh dailiy and take medications. Patient knows to notify physician for weight gain, increased shortness of breath, or swelling.      [facility] CM Care Plan Problem Two     Most Recent Value  Care Plan Problem Two Recent Hospitalization Heart Attack  Role Documenting the Problem Two Care  Management Telephonic Coordinator  Care Plan for Problem Two Active  Interventions for Problem Two Long Term Goal  Discussed with patient current clinical condition since hospital discharge, confirmed that patient has all medications and denies concerns around medications, reviewed post-hospital discharge instructions with patient and confirmed that patient has reliable transportation to all scheduled provider appointments post-hospital discharge.  [facility] Long Term Goal Patient will not experience unplanned hospital readmission within the next 31 days.   [facility] Long Term Goal Start Date 11/11/19  [facility] CM Short Term Goal #1  Patient will recognize  signs of heart attack within 14 days.  [facility] CM Short Term Goal #1 Start Date 11/11/19  Interventions for Short Term Goal #2  Reviewed with patient signs of heart attack and importance of seeking help immediately calling 911.    [facility] CM Short Term Goal #2  Patient will schedule to see heart doctor within 14 days.   [facility] CM Short Term Goal #2 Start Date 11/11/19  Interventions for Short Term Goal #2 Discussed with patient importance of follow up with heart physician.       RN CM will contact patient again in the month of September and patient agreeable. RN CM will send update to physician and new welcome letter to patient.      [name], RN, MSN [place] Management Care Management Coordinator Direct Line [phone] Cell [phone] Toll Free: [phone]  Fax: [phone]

## 2019-11-12 ENCOUNTER — Other Ambulatory Visit: Payer: Self-pay

## 2019-11-12 NOTE — Patient Outreach (Signed)
[facility]) Care Management  11/12/2019  Ruben Murray 09/22/1945 [id number]   EMMI- General Discharge RED ON EMMI ALERT Day # 4 Date: 11/11/19 Red Alert Reason: Got Discharge paper? I don't know  CM reviewed discharge papers with patient on 11/11/19. Patient also discussed discharge with physician on visit 11/11/19 as well.  Questions were answered per patient.  No outreach needed at this time   Plan: RN CM will outreach patient as scheduled.    [name], RN, MSN [facility] Care Management Care Management Coordinator Direct Line [phone] Toll Free: [phone]  Fax: [phone]

## 2019-11-21 DIAGNOSIS — B351 Tinea unguium: Secondary | ICD-10-CM | POA: Diagnosis not present

## 2019-11-21 DIAGNOSIS — M79672 Pain in left foot: Secondary | ICD-10-CM | POA: Diagnosis not present

## 2019-11-24 ENCOUNTER — Other Ambulatory Visit: Payer: Self-pay

## 2019-11-24 NOTE — Patient Outreach (Signed)
[facility]) Care Management  [place]  11/24/2019   Ruben Murray Jul 27, 1945 [id number]  Subjective: Telephone call to patient for follow up. Patient reports not good. He states his gout is bothering him and he is on prednisone for 5 days and will follow up with physician if it does not get better.  Discussed with patient wearing shoes that are not tight and avoiding seafood with gout. He verbalized understanding.  Patient continues to weigh daily and denies problems with irregular heart rate.  Last weight 251 lbs. Discussed heart failure and care.  He verbalized understanding.   Objective:   Encounter Medications:  Outpatient Encounter Medications as of 11/24/2019  Medication Sig Note  . acetaminophen (TYLENOL) 500 MG tablet Take 500-1,000 mg by mouth daily as needed for moderate pain.    Marland Kitchen amiodarone (PACERONE) 200 MG tablet Take 1 tablet (200 mg total) by mouth daily.   Marland Kitchen apixaban (ELIQUIS) 5 MG TABS tablet Take 1 tablet (5 mg total) by mouth 2 (two) times daily. (Patient not taking: Reported on 11/11/2019) 11/11/2019: Patient taking coumadin 3 mg Tab daily.  . ARTIFICIAL TEAR SOLUTION OP Place 1 drop into both eyes 2 (two) times daily.   . digoxin (LANOXIN) 0.25 MG tablet Take 1 tablet (0.25 mg total) by mouth daily.   . famotidine (PEPCID) 20 MG tablet One at bedtime (Patient taking differently: Take 20 mg by mouth at bedtime. One at bedtime)   . finasteride (PROSCAR) 5 MG tablet Take 5 mg by mouth daily at 12 noon.    . fluticasone (FLONASE) 50 MCG/ACT nasal spray Place 2 sprays into both nostrils 2 (two) times daily.   Marland Kitchen loratadine (CLARITIN) 10 MG tablet Take 10 mg by mouth daily at 12 noon.    Marland Kitchen losartan (COZAAR) 50 MG tablet Take 50 mg by mouth daily with lunch.   . metoprolol succinate (TOPROL-XL) 100 MG 24 hr tablet Take 2 tablets (200 mg total) by mouth daily.   . Multiple Vitamin (MULTIVITAMIN WITH MINERALS) TABS tablet Take 1 tablet by mouth daily at  12 noon.   Marland Kitchen PHENobarbital (LUMINAL) 97.2 MG tablet Take 97.2 mg by mouth at bedtime.    . polyvinyl alcohol (ARTIFICIAL TEARS) 1.4 % ophthalmic solution Place 1 drop into both eyes daily with lunch.   . pravastatin (PRAVACHOL) 40 MG tablet Take 40 mg by mouth at bedtime.     No facility-administered encounter medications on file as of 11/24/2019.    Functional Status:  In your present state of health, do you have any difficulty performing the following activities: 11/11/2019 06/12/2019  Hearing? Y Y  Comment trouble hearing at times trouble hearing at times  Vision? N N  Difficulty concentrating or making decisions? N N  Walking or climbing stairs? Y Y  Comment knee problems knee problems  Dressing or bathing? N N  Doing errands, shopping? N N  Preparing Food and eating ? N N  Using the Toilet? N N  In the past six months, have you accidently leaked urine? N N  Do you have problems with loss of bowel control? N N  Managing your Medications? N N  Managing your Finances? N N  Housekeeping or managing your Housekeeping? N N  Some recent data might be hidden    Fall/Depression Screening: Fall Risk  11/11/2019 09/12/2019 06/12/2019  Falls in the past year? 0 0 0  Number falls in past yr: - - -  Injury with Fall? - - -  Risk for fall due to : - - -  Follow up - - -   PHQ 2/9 Scores 11/11/2019 06/12/2019 06/07/2018 02/18/2018 11/15/2017  PHQ - 2 Score 0 0 0 1 0    Assessment: Patient continues hospital recovery. Patient continues to manage chronic conditions and benefits from disease management.    Plan:  [facility] CM Care Plan Problem One     Most Recent Value  Care Plan Problem One Continued learning related to diagnosis of heart failure and management of atrial fibrillation  Role Documenting the Problem One Care Management Telephonic Coordinator  Care Plan for Problem One Active  [facility] Long Term Goal  Patient will report no A. Fibrillation and will continue to watch for signs of Heart Failure  within 90 days.  [facility] Long Term Goal Start Date 11/11/19  Interventions for Problem One Long Term Goal Patient continues heart failure maintenance of weights daily and no salt diet.      [facility] CM Care Plan Problem Two     Most Recent Value  Care Plan Problem Two Recent Hospitalization Heart Attack  Role Documenting the Problem Two Care Management Telephonic [place] for Problem Two Active  Interventions for Problem Two Long Term Goal  Patient weighing daily. Seeing PCP as scheduled.  Taking medication and following no salt diet. Last weight 251 lbs.    [facility] Long Term Goal Patient will not experience unplanned hospital readmission within the next 31 days.   [facility] Long Term Goal Start Date 11/11/19  [facility] CM Short Term Goal #1  Patient will recognize signs of heart attack within 14 days.  [facility] CM Short Term Goal #1 Start Date 11/11/19  [facility] CM Short Term Goal #1 Met Date  11/24/19  [facility] CM Short Term Goal #2  Patient will schedule to see heart doctor within 14 days.   [facility] CM Short Term Goal #2 Start Date 11/11/19  Interventions for Short Term Goal #2 Reiterated importance of seeing heart doctor.       RN CM will contact patient next month and patient agreeable.    [name], RN, MSN [place] Management Care Management Coordinator Direct Line [phone] Cell [phone] Toll Free: [phone]  Fax: [phone]

## 2019-12-02 DIAGNOSIS — M79672 Pain in left foot: Secondary | ICD-10-CM | POA: Diagnosis not present

## 2019-12-02 DIAGNOSIS — Z23 Encounter for immunization: Secondary | ICD-10-CM | POA: Diagnosis not present

## 2019-12-02 DIAGNOSIS — Z7901 Long term (current) use of anticoagulants: Secondary | ICD-10-CM | POA: Diagnosis not present

## 2019-12-05 DIAGNOSIS — M79672 Pain in left foot: Secondary | ICD-10-CM | POA: Diagnosis not present

## 2019-12-05 DIAGNOSIS — L03119 Cellulitis of unspecified part of limb: Secondary | ICD-10-CM | POA: Diagnosis not present

## 2019-12-08 DIAGNOSIS — M79672 Pain in left foot: Secondary | ICD-10-CM | POA: Diagnosis not present

## 2019-12-09 ENCOUNTER — Inpatient Hospital Stay (HOSPITAL_COMMUNITY): Payer: Medicare HMO

## 2019-12-09 ENCOUNTER — Inpatient Hospital Stay (HOSPITAL_COMMUNITY)
Admission: EM | Admit: 2019-12-09 | Discharge: 2019-12-12 | DRG: 291 | Disposition: A | Discharge status: Home Health | Payer: Medicare HMO | Source: Ambulatory Visit | Attending: Internal Medicine | Admitting: Internal Medicine

## 2019-12-09 ENCOUNTER — Encounter (HOSPITAL_COMMUNITY): Payer: Self-pay | Admitting: Emergency Medicine

## 2019-12-09 ENCOUNTER — Emergency Department (HOSPITAL_COMMUNITY): Payer: Medicare HMO

## 2019-12-09 DIAGNOSIS — Z888 Allergy status to other drugs, medicaments and biological substances status: Secondary | ICD-10-CM

## 2019-12-09 DIAGNOSIS — I517 Cardiomegaly: Secondary | ICD-10-CM | POA: Diagnosis not present

## 2019-12-09 DIAGNOSIS — D6869 Other thrombophilia: Secondary | ICD-10-CM | POA: Diagnosis present

## 2019-12-09 DIAGNOSIS — D6859 Other primary thrombophilia: Secondary | ICD-10-CM | POA: Diagnosis present

## 2019-12-09 DIAGNOSIS — G4730 Sleep apnea, unspecified: Secondary | ICD-10-CM | POA: Diagnosis present

## 2019-12-09 DIAGNOSIS — Z66 Do not resuscitate: Secondary | ICD-10-CM | POA: Diagnosis present

## 2019-12-09 DIAGNOSIS — R609 Edema, unspecified: Secondary | ICD-10-CM | POA: Diagnosis not present

## 2019-12-09 DIAGNOSIS — R000 Tachycardia, unspecified: Secondary | ICD-10-CM | POA: Diagnosis present

## 2019-12-09 DIAGNOSIS — B351 Tinea unguium: Secondary | ICD-10-CM | POA: Diagnosis present

## 2019-12-09 DIAGNOSIS — I4892 Unspecified atrial flutter: Secondary | ICD-10-CM | POA: Diagnosis present

## 2019-12-09 DIAGNOSIS — Z20822 Contact with and (suspected) exposure to covid-19: Secondary | ICD-10-CM | POA: Diagnosis present

## 2019-12-09 DIAGNOSIS — I5082 Biventricular heart failure: Secondary | ICD-10-CM | POA: Diagnosis present

## 2019-12-09 DIAGNOSIS — G4733 Obstructive sleep apnea (adult) (pediatric): Secondary | ICD-10-CM | POA: Diagnosis present

## 2019-12-09 DIAGNOSIS — I700 Atherosclerosis of aorta: Secondary | ICD-10-CM | POA: Diagnosis present

## 2019-12-09 DIAGNOSIS — R0609 Other forms of dyspnea: Secondary | ICD-10-CM

## 2019-12-09 DIAGNOSIS — Z8249 Family history of ischemic heart disease and other diseases of the circulatory system: Secondary | ICD-10-CM

## 2019-12-09 DIAGNOSIS — N400 Enlarged prostate without lower urinary tract symptoms: Secondary | ICD-10-CM | POA: Diagnosis present

## 2019-12-09 DIAGNOSIS — B488 Other specified mycoses: Secondary | ICD-10-CM | POA: Diagnosis present

## 2019-12-09 DIAGNOSIS — I252 Old myocardial infarction: Secondary | ICD-10-CM

## 2019-12-09 DIAGNOSIS — I420 Dilated cardiomyopathy: Secondary | ICD-10-CM | POA: Diagnosis present

## 2019-12-09 DIAGNOSIS — Z7901 Long term (current) use of anticoagulants: Secondary | ICD-10-CM

## 2019-12-09 DIAGNOSIS — R52 Pain, unspecified: Secondary | ICD-10-CM | POA: Diagnosis not present

## 2019-12-09 DIAGNOSIS — R0600 Dyspnea, unspecified: Secondary | ICD-10-CM

## 2019-12-09 DIAGNOSIS — G40909 Epilepsy, unspecified, not intractable, without status epilepticus: Secondary | ICD-10-CM | POA: Diagnosis present

## 2019-12-09 DIAGNOSIS — R600 Localized edema: Secondary | ICD-10-CM | POA: Diagnosis not present

## 2019-12-09 DIAGNOSIS — Z96652 Presence of left artificial knee joint: Secondary | ICD-10-CM | POA: Diagnosis present

## 2019-12-09 DIAGNOSIS — J449 Chronic obstructive pulmonary disease, unspecified: Secondary | ICD-10-CM | POA: Diagnosis not present

## 2019-12-09 DIAGNOSIS — M79672 Pain in left foot: Secondary | ICD-10-CM | POA: Diagnosis not present

## 2019-12-09 DIAGNOSIS — Z87891 Personal history of nicotine dependence: Secondary | ICD-10-CM

## 2019-12-09 DIAGNOSIS — Z8241 Family history of sudden cardiac death: Secondary | ICD-10-CM

## 2019-12-09 DIAGNOSIS — I4819 Other persistent atrial fibrillation: Secondary | ICD-10-CM | POA: Diagnosis present

## 2019-12-09 DIAGNOSIS — Z6839 Body mass index (BMI) 39.0-39.9, adult: Secondary | ICD-10-CM

## 2019-12-09 DIAGNOSIS — U099 Post covid-19 condition, unspecified: Secondary | ICD-10-CM | POA: Diagnosis present

## 2019-12-09 DIAGNOSIS — I110 Hypertensive heart disease with heart failure: Secondary | ICD-10-CM | POA: Diagnosis not present

## 2019-12-09 DIAGNOSIS — L0390 Cellulitis, unspecified: Secondary | ICD-10-CM | POA: Diagnosis not present

## 2019-12-09 DIAGNOSIS — I5022 Chronic systolic (congestive) heart failure: Secondary | ICD-10-CM | POA: Diagnosis present

## 2019-12-09 DIAGNOSIS — I5023 Acute on chronic systolic (congestive) heart failure: Secondary | ICD-10-CM | POA: Diagnosis present

## 2019-12-09 DIAGNOSIS — E7849 Other hyperlipidemia: Secondary | ICD-10-CM

## 2019-12-09 DIAGNOSIS — I10 Essential (primary) hypertension: Secondary | ICD-10-CM | POA: Diagnosis not present

## 2019-12-09 DIAGNOSIS — I400 Infective myocarditis: Secondary | ICD-10-CM | POA: Diagnosis present

## 2019-12-09 DIAGNOSIS — E119 Type 2 diabetes mellitus without complications: Secondary | ICD-10-CM | POA: Diagnosis present

## 2019-12-09 DIAGNOSIS — I483 Typical atrial flutter: Secondary | ICD-10-CM | POA: Diagnosis not present

## 2019-12-09 DIAGNOSIS — R112 Nausea with vomiting, unspecified: Secondary | ICD-10-CM | POA: Diagnosis not present

## 2019-12-09 DIAGNOSIS — E782 Mixed hyperlipidemia: Secondary | ICD-10-CM | POA: Diagnosis present

## 2019-12-09 DIAGNOSIS — H903 Sensorineural hearing loss, bilateral: Secondary | ICD-10-CM | POA: Diagnosis present

## 2019-12-09 DIAGNOSIS — L03116 Cellulitis of left lower limb: Secondary | ICD-10-CM | POA: Diagnosis not present

## 2019-12-09 DIAGNOSIS — E785 Hyperlipidemia, unspecified: Secondary | ICD-10-CM | POA: Diagnosis present

## 2019-12-09 DIAGNOSIS — I2729 Other secondary pulmonary hypertension: Secondary | ICD-10-CM | POA: Diagnosis present

## 2019-12-09 DIAGNOSIS — M7989 Other specified soft tissue disorders: Secondary | ICD-10-CM | POA: Diagnosis not present

## 2019-12-09 DIAGNOSIS — R569 Unspecified convulsions: Secondary | ICD-10-CM

## 2019-12-09 DIAGNOSIS — Z79899 Other long term (current) drug therapy: Secondary | ICD-10-CM

## 2019-12-09 DIAGNOSIS — I4510 Unspecified right bundle-branch block: Secondary | ICD-10-CM | POA: Diagnosis present

## 2019-12-09 DIAGNOSIS — I2781 Cor pulmonale (chronic): Secondary | ICD-10-CM | POA: Diagnosis present

## 2019-12-09 LAB — RESPIRATORY PANEL BY RT PCR (FLU A&B, COVID)
Influenza A by PCR: NEGATIVE
Influenza B by PCR: NEGATIVE
SARS Coronavirus 2 by RT PCR: NEGATIVE

## 2019-12-09 LAB — COMPREHENSIVE METABOLIC PANEL
ALT: 21 U/L (ref 0–44)
AST: 24 U/L (ref 15–41)
Albumin: 3.9 g/dL (ref 3.5–5.0)
Alkaline Phosphatase: 128 U/L — ABNORMAL HIGH (ref 38–126)
Anion gap: 11 (ref 5–15)
BUN: 21 mg/dL (ref 8–23)
CO2: 25 mmol/L (ref 22–32)
Calcium: 8.6 mg/dL — ABNORMAL LOW (ref 8.9–10.3)
Chloride: 102 mmol/L (ref 98–111)
Creatinine, Ser: 1.1 mg/dL (ref 0.61–1.24)
GFR calc non Af Amer: >60 mL/min (ref >60)
Glucose, Bld: 125 mg/dL — ABNORMAL HIGH (ref 70–99)
Potassium: 4.2 mmol/L (ref 3.5–5.1)
Sodium: 138 mmol/L (ref 135–145)
Total Bilirubin: 0.7 mg/dL (ref 0.3–1.2)
Total Protein: 7.3 g/dL (ref 6.5–8.1)

## 2019-12-09 LAB — CBC WITH DIFFERENTIAL/PLATELET
Abs Immature Granulocytes: 0.05 10*3/uL (ref 0.00–0.07)
Basophils Absolute: 0.1 10*3/uL (ref 0.0–0.1)
Basophils Relative: 0 %
Eosinophils Absolute: 0.1 10*3/uL (ref 0.0–0.5)
Eosinophils Relative: 1 %
HCT: 41.9 % (ref 39.0–52.0)
Hemoglobin: 14.1 g/dL (ref 13.0–17.0)
Immature Granulocytes: 0 %
Lymphocytes Relative: 14 %
Lymphs Abs: 1.7 10*3/uL (ref 0.7–4.0)
MCH: 31.4 pg (ref 26.0–34.0)
MCHC: 33.7 g/dL (ref 30.0–36.0)
MCV: 93.3 fL (ref 80.0–100.0)
Monocytes Absolute: 0.7 10*3/uL (ref 0.1–1.0)
Monocytes Relative: 6 %
Neutro Abs: 9.8 10*3/uL — ABNORMAL HIGH (ref 1.7–7.7)
Neutrophils Relative %: 79 %
Platelets: 240 10*3/uL (ref 150–400)
RBC: 4.49 MIL/uL (ref 4.22–5.81)
RDW: 13.5 % (ref 11.5–15.5)
WBC: 12.4 10*3/uL — ABNORMAL HIGH (ref 4.0–10.5)
nRBC: 0 % (ref 0.0–0.2)

## 2019-12-09 LAB — C-REACTIVE PROTEIN: CRP: 1.3 mg/dL — ABNORMAL HIGH (ref <1.0)

## 2019-12-09 LAB — MAGNESIUM: Magnesium: 1.8 mg/dL (ref 1.7–2.4)

## 2019-12-09 LAB — BRAIN NATRIURETIC PEPTIDE: B Natriuretic Peptide: 80.6 pg/mL (ref 0.0–100.0)

## 2019-12-09 LAB — CBG MONITORING, ED: Glucose-Capillary: 96 mg/dL (ref 70–99)

## 2019-12-09 LAB — LACTIC ACID, PLASMA
Lactic Acid, Venous: 1.4 mmol/L (ref 0.5–1.9)
Lactic Acid, Venous: 1.8 mmol/L (ref 0.5–1.9)

## 2019-12-09 LAB — SEDIMENTATION RATE: Sed Rate: 24 mm/hr — ABNORMAL HIGH (ref 0–16)

## 2019-12-09 MED ORDER — PIPERACILLIN-TAZOBACTAM 3.375 G IVPB 30 MIN
3.3750 g | Freq: Once | INTRAVENOUS | Status: AC
  Administered 2019-12-09: 3.375 g via INTRAVENOUS
  Filled 2019-12-09: qty 50

## 2019-12-09 MED ORDER — VANCOMYCIN HCL 2000 MG/400ML IV SOLN
2000.0000 mg | Freq: Once | INTRAVENOUS | Status: AC
  Administered 2019-12-09: 2000 mg via INTRAVENOUS
  Filled 2019-12-09: qty 400

## 2019-12-09 MED ORDER — FUROSEMIDE 10 MG/ML IJ SOLN
60.0000 mg | Freq: Once | INTRAMUSCULAR | Status: AC
  Administered 2019-12-09: 60 mg via INTRAVENOUS
  Filled 2019-12-09: qty 8

## 2019-12-09 MED ORDER — INSULIN ASPART 100 UNIT/ML ~~LOC~~ SOLN
0.0000 [IU] | SUBCUTANEOUS | Status: DC
  Administered 2019-12-10 (×2): 1 [IU] via SUBCUTANEOUS
  Filled 2019-12-09: qty 0.09

## 2019-12-09 NOTE — Progress Notes (Signed)
A consult was received from an ED physician for zosyn and vancomycin per pharmacy dosing.  The patient's profile has been reviewed for ht/wt/allergies/indication/available labs.   A one time order has been placed for zosyn 3.375gm and vancomycin 2gm    Further antibiotics/pharmacy consults should be ordered by admitting physician if indicated.                       Thank you, Dolly Rias RPh 12/09/2019, 10:07 PM

## 2019-12-09 NOTE — ED Triage Notes (Signed)
Per EMS- Patient c/o left foot swelling and infection x 4 weeks. Patient has been on oral antibiotics x 4 weeks. Patient was sent to the ED for IV antibiotics.

## 2019-12-09 NOTE — H&P (Addendum)
Ruben Murray WEX:[id number] DOB: [birth year] DOA: 12/09/2019     PCP: [name], MD   Outpatient Specialists:  CARDS:   Dr. Claiborne Billings   Orthopedics Dr. Gladstone Lighter Patient arrived to ER on 12/09/19 at 1635 Referred by Attending Hayden Rasmussen, MD Patient coming from: home Lives  With family  Chief Complaint:   Chief Complaint  Patient presents with  . Cellulitis    HPI: Ruben Murray is a 74 y.o. male with medical history significant of HLD, prediabetes, HTN, BPH, paroxysmal atrial fibrillation on anticoagulation, Covid positive in August 2021 status post monoclonal antibodies,, COPD, morbid severe obesity, acquired thrombophilia, dilated cardiomyopathy,    Presented with 4-week history of left foot swelling known infection he has been on antibiotics for the past 4 weeks but continues to decline he was sent to emergency department for IV antibiotics. Initially PCP though it was gout he was treated but he is unsure what It did not seem to help he was seen to orthopedics who felt it was more likley to be cellulitis patient has been treated as an outpatient with Keflex and followed by Dr. Gladstone Lighter of orthopedics with no improvement Denies any fever Today had nausea and vomiting  No chest pain  Reports increased leg edema Increased shortness of breath  MRI from yesterday 12/08/2019 was done Notes persistent left foot and ankle moderate swelling and tenderness with erythema In the end of August 2021 patient was admitted to syncope and found to be in A. fib with RVR on presentation started on Cardizem drip was able to convert to oral metoprolol oral digoxin and amiodarone initially was discharged home on Eliquis but stated could not afford it and was switched back to Coumadin Suffered from NSTEMI during this admission with troponin peaking at 4176 undergone cardiac catheterization showed nonischemic cardiomyopathy with EF 20-25% pulmonary hypertension Recent Covid infection now  outside of infectious window  Has   been vaccinated against COVID x3   Initial COVID TEST  NEGATIVE    Lab Results  Component Value Date   [place] 12/09/2019   [place] NEGATIVE 04/08/2019   [place] NEGATIVE 10/31/2018   [place] NEGATIVE 09/23/2018    Regarding pertinent Chronic problems:    Hyperlipidemia -  on statins Pravachol Lipid Panel     Component Value Date/Time   CHOL 114 11/04/2019 0214   TRIG 60 11/04/2019 0214   HDL 34 (L) 11/04/2019 0214   CHOLHDL 3.4 11/04/2019 0214   VLDL 12 11/04/2019 0214   LDLCALC 68 11/04/2019 0214   History of seizure disorder on phenobarbital    HTN on Toprol Cozaar   chronic CHF systolic  - last echo August    CAD  - On  , statin, betablocker,                 -  followed by cardiology                - last cardiac cath in August    DM 2 -  Lab Results  Component Value Date   HGBA1C 6.2 (H) 11/05/2019   diet controlled     Morbid obesity-   BMI Readings from Last 1 Encounters:  11/06/19 38.97 kg/m       COPD - not  followed by pulmonology      A. Fib -  CHA2DS2 vas score 5  current  on anticoagulation with Coumadin          -  Rate control:  Currently controlled with  Toprolol, on digoxin        - Rhythm controled  Amiodarone    While in ER: Noted to have elevated white blood cell count up to 12   ER Provider Called: Orthopedics   Dr Stann Mainland They Recommend admit to medicine  Will see in AM   Hospitalist was called for admission for [facility]  The following Work up has been ordered so far:  Orders Placed This Encounter  Procedures  . Culture, blood (routine x 2)  . Respiratory Panel by RT PCR (Flu A&B, Covid) - Nasopharyngeal Swab  . Lactic acid, plasma  . Comprehensive metabolic panel  . CBC with Differential  . Sedimentation rate  . C-reactive protein  . Urinalysis, Routine w reflex microscopic  . Consult to orthopedic surgery  ALL PATIENTS BEING ADMITTED/HAVING PROCEDURES NEED  COVID-19 SCREENING Patient of Dr Cay Schillings - abnormal outpatient MRI  . Consult to hospitalist  ALL PATIENTS BEING ADMITTED/HAVING PROCEDURES NEED COVID-19 SCREENING  . EKG 12-Lead   Following Medications were ordered in ER: Medications - No data to display      Consult Orders  (From admission, onward)         Start     Ordered   12/09/19 2055  Consult to hospitalist  ALL PATIENTS BEING ADMITTED/HAVING PROCEDURES NEED COVID-19 SCREENING  Once       Comments: ALL PATIENTS BEING ADMITTED/HAVING PROCEDURES NEED COVID-19 SCREENING  Provider:  (Not yet assigned)  Question Answer Comment  Place call to: [facility]   Reason for Consult Admit      12/09/19 2055          Significant initial  Findings: Abnormal Labs Reviewed  COMPREHENSIVE METABOLIC PANEL - Abnormal; Notable for the following components:      Result Value   Glucose, Bld 125 (*)    Calcium 8.6 (*)    Alkaline Phosphatase 128 (*)    All other components within normal limits  CBC WITH DIFFERENTIAL/PLATELET - Abnormal; Notable for the following components:   WBC 12.4 (*)    Neutro Abs 9.8 (*)    All other components within normal limits  SEDIMENTATION RATE - Abnormal; Notable for the following components:   Sed Rate 24 (*)    All other components within normal limits  C-REACTIVE PROTEIN - Abnormal; Notable for the following components:   CRP 1.3 (*)    All other components within normal limits    Otherwise labs showing:    Recent Labs  Lab 12/09/19 1654  NA 138  K 4.2  CO2 25  GLUCOSE 125*  BUN 21  CREATININE 1.10  CALCIUM 8.6*    Cr   Stable,  Lab Results  Component Value Date   CREATININE 1.10 12/09/2019   CREATININE 1.12 11/06/2019   CREATININE 0.96 11/05/2019    Recent Labs  Lab 12/09/19 1654  AST 24  ALT 21  ALKPHOS 128*  BILITOT 0.7  PROT 7.3  ALBUMIN 3.9   Lab Results  Component Value Date   CALCIUM 8.6 (L) 12/09/2019   PHOS 3.7 10/30/2017      WBC      Component  Value Date/Time   WBC 12.4 (H) 12/09/2019 1654   LYMPHSABS 1.7 12/09/2019 1654   MONOABS 0.7 12/09/2019 1654   EOSABS 0.1 12/09/2019 1654   BASOSABS 0.1 12/09/2019 1654       Plt: Lab Results  Component Value Date   PLT 240 12/09/2019   Lactic Acid, Venous  Component Value Date/Time   LATICACIDVEN 1.8 12/09/2019 1925     COVID-19 Labs  Recent Labs    12/09/19 1925  CRP 1.3*    Lab Results  Component Value Date   SARSCOV2NAA NEGATIVE 12/09/2019   SARSCOV2NAA NEGATIVE 04/08/2019   SARSCOV2NAA NEGATIVE 10/31/2018   [place] NEGATIVE 09/23/2018     HG/HCT   stable,      Component Value Date/Time   HGB 14.1 12/09/2019 1654   HGB 15.2 03/11/2019 1222   HCT 41.9 12/09/2019 1654   HCT 43.3 03/11/2019 1222   MCV 93.3 12/09/2019 1654   MCV 95 03/11/2019 1222    No results for input(s): LIPASE, AMYLASE in the last 168 hours. No results for input(s): AMMONIA in the last 168 hours.      ECG: Ordered Personally reviewed by me showing: HR : 80 Rhythm:  A.flatter  no evidence of ischemic changes QTC 461   BNP (last 3 results) Recent Labs    11/03/19 1855  BNP 224.8*      DM  labs:  HbA1C: Recent Labs    11/05/19 0733  HGBA1C 6.2*     CBG (last 3)  No results for input(s): GLUCAP in the last 72 hours.     UA not ordered   Urine analysis:    Component Value Date/Time   COLORURINE STRAW (A) 11/01/2017 1934   APPEARANCEUR CLEAR 11/01/2017 1934   LABSPEC 1.005 11/01/2017 1934   PHURINE 7.0 11/01/2017 1934   GLUCOSEU NEGATIVE 11/01/2017 1934   HGBUR NEGATIVE 11/01/2017 1934   BILIRUBINUR NEGATIVE 11/01/2017 1934   KETONESUR NEGATIVE 11/01/2017 1934   PROTEINUR NEGATIVE 11/01/2017 1934   UROBILINOGEN 0.2 05/06/2010 1010   NITRITE NEGATIVE 11/01/2017 1934   LEUKOCYTESUR NEGATIVE 11/01/2017 1934    CXR -   Ordered Plain imaging   ED Triage Vitals  Enc Vitals Group     BP 12/09/19 1644 136/73     Pulse Rate 12/09/19 1644 89     Resp  12/09/19 1644 18     Temp 12/09/19 1644 98.2 F (36.8 C)     Temp Source 12/09/19 1644 Oral     SpO2 12/09/19 1642 98 %     Weight --      Height --      Head Circumference --      Peak Flow --      Pain Score 12/09/19 1643 6     Pain Loc --      Pain Edu? --      Excl. in [place]? --   TMAX(24)@       Latest  Blood pressure (!) 141/73, pulse 84, temperature 98.2 F (36.8 C), temperature source Oral, resp. rate 16, SpO2 93 %.   Review of Systems:    Pertinent positives include:   Fatigue  nausea  Constitutional:  No weight loss, night sweats, Fevers, chills,, weight loss  HEENT:  No headaches, Difficulty swallowing,Tooth/dental problems,Sore throat,  No sneezing, itching, ear ache, nasal congestion, post nasal drip,  Cardio-vascular:  No chest pain, Orthopnea, PND, anasarca, dizziness, palpitations.no Bilateral lower extremity swelling  GI:  No heartburn, indigestion, abdominal pain,, vomiting, diarrhea, change in bowel habits, loss of appetite, melena, blood in stool, hematemesis Resp:  no shortness of breath at rest. No dyspnea on exertion, No excess mucus, no productive cough, No non-productive cough, No coughing up of blood.No change in color of mucus.No wheezing. Skin:  no rash or lesions. No jaundice GU:  no dysuria, change in  color of urine, no urgency or frequency. No straining to urinate.  No flank pain.  Musculoskeletal:  No joint pain or no joint swelling. No decreased range of motion. No back pain.  Psych:  No change in mood or affect. No depression or anxiety. No memory loss.  Neuro: no localizing neurological complaints, no tingling, no weakness, no double vision, no gait abnormality, no slurred speech, no confusion  All systems reviewed and apart from [place] all are negative  Past Medical History:   Past Medical History:  Diagnosis Date  . Acquired thrombophilia ([place]) 01/15/2019  . Aortic atherosclerosis ([place])   . Atrial fibrillation with RVR ([place])  10/29/2017  . CAP (community acquired pneumonia) 03/06/2016  . Cardiomyopathy ([place])    a. EF 40-45% in 04/2016, etiology not defined, managed medically.  . CHF exacerbation ([place]) 10/29/2017  . Chronic laryngitis 10/03/2016  . COPD GOLD II  08/02/2016   Quit smoking 2000  PFT's  07/10/2016  FEV1 1.98 (70 % ) ratio 67  p 19 % improvement from saba p nothing  prior to study while of coreg so rec as of 08/02/2016 try off coreg and on bisoprolol     . Dysphonia 10/03/2016  . Essential hypertension 03/06/2016   Changed from coreg to bisoprolol due to copd with reversible component  08/02/2016 >>>   . Fever 03/12/2015  . History of cardioversion   . Hoarseness 08/31/2016   Referred to ent 08/31/2016 >>> seen 10/03/16 [name] dx gerd and rhinitis medicamentosa   > improved on f/u 01/31/17 on gerd rx/ flonase and off afrin  . Hyperlipidemia   . Hypertension   . Laryngopharyngeal reflux (LPR) 10/03/2016  . Moderate persistent asthma 08/02/2016  . Morbid (severe) obesity due to excess calories ([place]) 08/02/2016  . Myocardial infarction [facility]) 1980-early 2000s X 2   "mild ones" (03/06/2016)  . Nasal polyps 10/03/2016  . OSA on CPAP    a. pt states was told by Dr. Maxwell Caul that he would not require CPAP as long as he continued to sleep in recliner which he does for his back.  . Paroxysmal atrial fibrillation ([place])   . Pneumonia    "when I was a kid" (03/06/2016)  . Prolonged QT interval 10/29/2017  . RBBB   . 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 Rhinitis medicamentosa 10/03/2016  . Right thyroid nodule 03/06/2016  . Seizures ([place])    "take RX daily" (03/06/2016)  . Sensorineural hearing loss (SNHL) of both ears 01/31/2017     Past Surgical History:  Procedure Laterality Date  . CARDIOVERSION N/A 12/13/2017   Procedure: CARDIOVERSION;  Surgeon: [name], MD;  Location: [place];  Service: Cardiovascular;  Laterality: N/A;  . CARDIOVERSION N/A 09/25/2018   Procedure: CARDIOVERSION;  Surgeon: [name], MD;  Location: [facility] ENDOSCOPY;  Service: Cardiovascular;  Laterality: N/A;  . CARDIOVERSION N/A 11/04/2018   Procedure: CARDIOVERSION;  Surgeon: [name], MD;  Location[facility] ENDOSCOPY;  Service: Cardiovascular;  Laterality: N/A;  . CARDIOVERSION N/A 04/11/2019   Procedure: CARDIOVERSION;  Surgeon: [name], MD;  Location: [facility] ENDOSCOPY;  Service: Cardiovascular;  Laterality: N/A;  . CIRCUMCISION    . JOINT REPLACEMENT    . PERCUTANEOUS PINNING TOE FRACTURE Left    "big toe  . REPLACEMENT TOTAL KNEE Left   . RIGHT/LEFT HEART CATH AND CORONARY ANGIOGRAPHY N/A 11/06/2019   Procedure: RIGHT/LEFT HEART CATH AND CORONARY ANGIOGRAPHY;  Surgeon: [name], MD;  Location: [place] CV LAB;  Service: Cardiovascular;  Laterality: N/A;  . TONSILLECTOMY AND ADENOIDECTOMY      Social History:  Ambulatory  Kasandra Knudsen      reports that he quit smoking about 21 years ago. His smoking use included cigarettes. He has a 144.00 pack-year smoking history. He has never used smokeless tobacco. He reports that he does not drink alcohol and does not use drugs.  Family History:   Family History  Problem Relation Age of Onset  . Hypertension Mother   . Other Father        sudden cardiac arrest    Allergies: Allergies  Allergen Reactions  . Dilaudid [Hydromorphone Hcl] Other (See Comments)    Makes pt hyper   . Dilaudid [Hydromorphone] Other (See Comments)    hyperactiviity  . Tegretol [Carbamazepine] Hives  . Tegretol [Carbamazepine] Hives     Prior to Admission medications   Medication Sig Start Date End Date Taking? Authorizing Provider  acetaminophen (TYLENOL) 500 MG tablet Take 500-1,000 mg by mouth daily as needed for moderate pain.     [provider]  amiodarone (PACERONE) 200 MG tablet Take 1 tablet (200 mg total) by mouth daily. 05/13/19   Camnitz, Ocie Doyne, MD  apixaban (ELIQUIS) 5 MG TABS tablet Take 1 tablet (5 mg total) by mouth 2 (two) times daily. Patient not  taking: Reported on 11/11/2019 11/06/19 12/06/19  Terrilee Croak, MD  ARTIFICIAL TEAR SOLUTION OP Place 1 drop into both eyes 2 (two) times daily.    [provider]  digoxin (LANOXIN) 0.25 MG tablet Take 1 tablet (0.25 mg total) by mouth daily. 11/07/19 12/07/19  Terrilee Croak, MD  famotidine (PEPCID) 20 MG tablet One at bedtime Patient taking differently: Take 20 mg by mouth at bedtime. One at bedtime 08/02/16   Tanda Rockers, MD  finasteride (PROSCAR) 5 MG tablet Take 5 mg by mouth daily at 12 noon.     [provider]  fluticasone (FLONASE) 50 MCG/ACT nasal spray Place 2 sprays into both nostrils 2 (two) times daily. 10/24/17   [provider]  loratadine (CLARITIN) 10 MG tablet Take 10 mg by mouth daily at 12 noon.     [provider]  losartan (COZAAR) 50 MG tablet Take 50 mg by mouth daily with lunch. 08/13/19   [provider]  metoprolol succinate (TOPROL-XL) 100 MG 24 hr tablet Take 2 tablets (200 mg total) by mouth daily. 11/06/19 12/06/19  Terrilee Croak, MD  Multiple Vitamin (MULTIVITAMIN WITH MINERALS) TABS tablet Take 1 tablet by mouth daily at 12 noon.    [provider]  PHENobarbital (LUMINAL) 97.2 MG tablet Take 97.2 mg by mouth at bedtime.     [provider]  polyvinyl alcohol (ARTIFICIAL TEARS) 1.4 % ophthalmic solution Place 1 drop into both eyes daily with lunch.    [provider]  pravastatin (PRAVACHOL) 40 MG tablet Take 40 mg by mouth at bedtime.     [provider]   Physical Exam: Vitals with BMI 12/09/2019 12/09/2019 12/09/2019  Height - - -  Weight - - -  BMI - - -  Systolic 203 559 741  Diastolic 73 74 67  Pulse 84 77 75     1. General:  in No    Acute distress    Chronically ill  -appearing 2. Psychological: Alert and  Oriented 3. Head/ENT:    Dry Mucous Membranes  Head Non traumatic, neck supple                            Poor Dentition 4. SKIN:   decreased Skin  turgor,  Skin clean Dry and intact swelling, mild redeness of Left FOOT  5. Heart: Regular rate and rhythm no  Murmur, no Rub or gallop 6. Lungs:  , no wheezes or crackles   7. Abdomen: Soft, non-tender, Non distended  obese  bowel sounds present 8. Lower extremities: no clubbing, cyanosis, 2+ edema 9. Neurologically Grossly intact, moving all 4 extremities equally   10. MSK: Normal range of motion   All other LABS:     Recent Labs  Lab 12/09/19 1654  WBC 12.4*  NEUTROABS 9.8*  HGB 14.1  HCT 41.9  MCV 93.3  PLT 240     Recent Labs  Lab 12/09/19 1654  NA 138  K 4.2  CL 102  CO2 25  GLUCOSE 125*  BUN 21  CREATININE 1.10  CALCIUM 8.6*     Recent Labs  Lab 12/09/19 1654  AST 24  ALT 21  ALKPHOS 128*  BILITOT 0.7  PROT 7.3  ALBUMIN 3.9       Cultures:    Component Value Date/Time   SDES EXPECTORATED SPUTUM 03/06/2016 0841   SDES EXPECTORATED SPUTUM 03/06/2016 0841   SPECREQUEST NONE 03/06/2016 0841   SPECREQUEST NONE Reflexed from [id number] 03/06/2016 0841   CULT Consistent with normal respiratory flora. 03/06/2016 0841   REPTSTATUS 03/07/2016 FINAL 03/06/2016 0841   REPTSTATUS 03/09/2016 FINAL 03/06/2016 0841     Radiological Exams on Admission: DG Foot Complete Left  Result Date: 12/09/2019 CLINICAL DATA:  No injury, entire foot is swollen and painful, patient states it has been hurting for 2 weeks EXAM: LEFT FOOT - COMPLETE 3+ VIEW COMPARISON:  None. FINDINGS: There is no evidence of fracture or dislocation. There is no evidence of arthropathy or other focal bone abnormality. Diffuse subcutaneus soft tissue edema. IMPRESSION: No acute displaced fracture or dislocation. Electronically Signed   By: Iven Finn M.D.   On: 12/09/2019 22:05    Chart has been reviewed   Assessment/Plan   74 y.o. male with medical history significant of HLD, prediabetes, HTN, BPH, paroxysmal atrial fibrillation on anticoagulation, Covid positive in August 2021 status post  monoclonal antibodies,, COPD, morbid severe obesity, acquired thrombophilia, dilated cardiomyopathy,  Admitted for Cellulitis and acute on chronic systolic CHF exacerbation   Present on Admission: . Cellulitis - -admit per cellulitis protocol will          continue current antibiotic choice Zosyn and Vanco given recent hospitalizations Prediabetes and increased risk      plain films showed:  no evidence of air  no evidence of osteomyelitis   no               foreign   objects        Will obtain MRSA screening,     obtain blood cultures       further antibiotic adjustment pending above results  Nausea vomiting felt to be most likely due to cellulitis but given recent NSTEMI and diabetes will check a troponin to evaluate for silent anginal equivalent  Recent Covid infection now finished self-isolation  . Persistent atrial fibrillation (HCC) -currently on anticoagulation with Coumadin, continue Toprol check digoxin level   History of seizure disorder check phenobarbital level    . Morbid (severe) obesity due to excess  calories ([place]) we will need nutritional assessment and follow-up as an outpatient  . Hyperlipidemia stable continue home medications  . Essential hypertension continue home medications   . COPD GOLD II chronic currently stable   . acute on Chronic systolic CHF (congestive heart failure) ([place]) -last EF 20 to 30%.  Already anticoagulated.  Followed by cardiology.   appears to be fluid up Not on diuretics at home Continue Losartan    . Acquired thrombophilia ([place]) on chronic anticoagulation check INR Coumadin per pharmacy  DM 2-  - Order Sensitive SSI   -  check TSH and HgA1C    Other plan as per orders.  DVT prophylaxis:   couamdin    Code Status:    Code Status: Prior   DNR/DNI  as per patient  I had personally discussed CODE STATUS with patient    Family Communication:   Family not at  Bedside    Disposition Plan:     To home once workup is complete and  patient is stable   Following barriers for discharge:                                                        able to transition to PO antibiotics                             Will need to be able to tolerate PO                                                        Will need consultants to evaluate patient prior to discharge                     Would benefit from PT/OT eval prior to DC  Ordered                                     Transition of care consulted                   Nutrition    consulted                    Consults called: orthopedics are aware, emailed cardiology   Admission status:  ED Disposition    None         inpatient     I Expect 2 midnight stay secondary to severity of patient's current illness need for inpatient interventions justified by the following:   * Severe lab/radiological/exam abnormalities including:     and extensive comorbidities including:    DM2    CHF     COPD/asthma     .   Marland Kitchen Chronic anticoagulation  That are currently affecting medical management.   I expect  patient to be hospitalized for 2 midnights requiring inpatient medical care.  Patient is at high risk for adverse outcome (such as loss of life or disability) if not treated.  Indication for inpatient stay as follows:  Failure of outpatient antibiotic treatment  Need for IV antibiotics, IV fluids,     Level of care     tele  For 12H     Lab Results  Component Value Date   [place] NEGATIVE 12/09/2019     Precautions: admitted as  Covid Negative  PPE: Used by the provider:   P100  eye Goggles,  Gloves  Caetano Oberhaus 12/09/2019, 10:57 PM    [facility]     after 2 AM please page floor coverage PA If 7AM-7PM, please contact the day team taking care of the patient using [email or web address]   Patient was evaluated in the context of the global COVID-19 pandemic, which necessitated consideration that the patient might be at risk for infection with the  SARS-CoV-2 virus that causes COVID-19. Institutional protocols and algorithms that pertain to the evaluation of patients at risk for COVID-19 are in a state of rapid change based on information released by regulatory bodies including the CDC and federal and state organizations. These policies and algorithms were followed during the patient's care.    August 2021 patient

## 2019-12-09 NOTE — ED Provider Notes (Signed)
[facility] DEPT Provider Note   CSN: [id number] Arrival date & time: 12/09/19  1635     History Chief Complaint  Patient presents with  . Cellulitis    Ruben Murray is a 74 y.o. male.  He has a history of A. fib and is on anticoagulation.  Complaining of left foot swelling and pain that is been going on for almost a month.  He has been on Keflex and followed up with Dr. Gladstone Lighter from orthopedics.  He had an outpatient MRI yesterday.  He was referred to the ED to get admitted for IV antibiotics.  He does not think he has had a fever.  He has felt fatigued and he was nauseous and vomited today.  Nonproductive cough.  The history is provided by the patient.  Leg Pain Location:  Foot and ankle Time since incident:  1 month Injury: no   Ankle location:  L ankle Foot location:  L foot Pain details:    Quality:  Throbbing   Severity:  Moderate   Onset quality:  Gradual   Duration:  1 month   Timing:  Constant   Progression:  Worsening Chronicity:  New Relieved by:  Nothing Worsened by:  Bearing weight Ineffective treatments: antibiotics. Associated symptoms: fatigue and swelling   Associated symptoms: no fever   Risk factors: obesity        Past Medical History:  Diagnosis Date  . Acquired thrombophilia ([place]) 01/15/2019  . Aortic atherosclerosis ([place])   . Atrial fibrillation with RVR ([place]) 10/29/2017  . CAP (community acquired pneumonia) 03/06/2016  . Cardiomyopathy ([place])    a. EF 40-45% in 04/2016, etiology not defined, managed medically.  . CHF exacerbation ([place]) 10/29/2017  . Chronic laryngitis 10/03/2016  . COPD GOLD II  08/02/2016   Quit smoking 2000  PFT's  07/10/2016  FEV1 1.98 (70 % ) ratio 67  p 19 % improvement from saba p nothing  prior to study while of coreg so rec as of 08/02/2016 try off coreg and on bisoprolol     . Dysphonia 10/03/2016  . Essential hypertension 03/06/2016   Changed from coreg to bisoprolol due to copd with  reversible component  08/02/2016 >>>   . Fever 03/12/2015  . History of cardioversion   . Hoarseness 08/31/2016   Referred to ent 08/31/2016 >>> seen 10/03/16 [name] dx gerd and rhinitis medicamentosa   > improved on f/u 01/31/17 on gerd rx/ flonase and off afrin  . Hyperlipidemia   . Hypertension   . Laryngopharyngeal reflux (LPR) 10/03/2016  . Moderate persistent asthma 08/02/2016  . Morbid (severe) obesity due to excess calories ([place]) 08/02/2016  . Myocardial infarction [facility]) 1980-early 2000s X 2   "mild ones" (03/06/2016)  . Nasal polyps 10/03/2016  . OSA on CPAP    a. pt states was told by Dr. Maxwell Caul that he would not require CPAP as long as he continued to sleep in recliner which he does for his back.  . Paroxysmal atrial fibrillation ([place])   . Pneumonia    "when I was a kid" (03/06/2016)  . Prolonged QT interval 10/29/2017  . RBBB   . 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 Rhinitis medicamentosa 10/03/2016  . Right thyroid nodule 03/06/2016  . Seizures ([place])    "take RX daily" (03/06/2016)  . Sensorineural hearing loss (SNHL) of both ears 01/31/2017    Patient Active Problem List   Diagnosis Date Noted  . Dilated cardiomyopathy ([place])   .  Atrial flutter ([place]) 11/03/2019  . Atrial flutter with rapid ventricular response ([place]) 11/03/2019  . Acquired thrombophilia ([place]) 01/15/2019  . Persistent atrial fibrillation   . Acute on chronic combined systolic and diastolic CHF (congestive heart failure) ([place]) 10/29/2017  . CHF exacerbation ([place]) 10/29/2017  . Prolonged QT interval 10/29/2017  . Sensorineural hearing loss (SNHL) of both ears 01/31/2017  . Dysphonia 10/03/2016  . Laryngopharyngeal reflux (LPR) 10/03/2016  . Nasal polyps 10/03/2016  . Rhinitis medicamentosa 10/03/2016  . Chronic laryngitis 10/03/2016  . Hoarseness 08/31/2016  . Moderate persistent asthma 08/02/2016  . COPD GOLD II  08/02/2016  . Morbid (severe) obesity due to excess calories ([employer]) 08/02/2016  . CAP (community  acquired pneumonia) 03/07/2016  . Community acquired pneumonia 03/06/2016  . Essential hypertension 03/06/2016  . Hyperlipidemia 03/06/2016  . Right thyroid nodule 03/06/2016  . Seizures ([place]) 03/06/2016  . Fever 03/12/2015  . 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 CARDIOVERSION N/A 12/13/2017   Procedure: CARDIOVERSION;  Surgeon: [name], MD;  Location: [facility];  Service: Cardiovascular;  Laterality: N/A;  . CARDIOVERSION N/A 09/25/2018   Procedure: CARDIOVERSION;  Surgeon: [name], MD;  Location[facility] ENDOSCOPY;  Service: Cardiovascular;  Laterality: N/A;  . CARDIOVERSION N/A 11/04/2018   Procedure: CARDIOVERSION;  Surgeon: [name], MD;  Location: [facility] ENDOSCOPY;  Service: Cardiovascular;  Laterality: N/A;  . CARDIOVERSION N/A 04/11/2019   Procedure: CARDIOVERSION;  Surgeon: [name], MD;  Location: [facility] ENDOSCOPY;  Service: Cardiovascular;  Laterality: N/A;  . CIRCUMCISION    . JOINT REPLACEMENT    . PERCUTANEOUS PINNING TOE FRACTURE Left    "big toe  . REPLACEMENT TOTAL KNEE Left   . RIGHT/LEFT HEART CATH AND CORONARY ANGIOGRAPHY N/A 11/06/2019   Procedure: RIGHT/LEFT HEART CATH AND CORONARY ANGIOGRAPHY;  Surgeon: [name], MD;  Location: [facility] CV LAB;  Service: Cardiovascular;  Laterality: N/A;  . TONSILLECTOMY AND ADENOIDECTOMY         Family History  Problem Relation Age of Onset  . Hypertension Mother   . Other Father        sudden cardiac arrest    Social History   Tobacco Use  . Smoking status: Former Smoker    Packs/day: 3.00    Years: 48.00    Pack years: 144.00    Types: Cigarettes    Quit date: 2000    Years since quitting: 21.7  . Smokeless tobacco: Never Used  Vaping Use  . Vaping Use: Never used  Substance Use Topics  . Alcohol use: Never  . Drug use: Never    Home Medications Prior to Admission medications   Medication Sig Start Date End Date  Taking? Authorizing Provider  acetaminophen (TYLENOL) 500 MG tablet Take 500-1,000 mg by mouth daily as needed for moderate pain.     [provider]  amiodarone (PACERONE) 200 MG tablet Take 1 tablet (200 mg total) by mouth daily. 05/13/19   Camnitz, Ocie Doyne, MD  apixaban (ELIQUIS) 5 MG TABS tablet Take 1 tablet (5 mg total) by mouth 2 (two) times daily. Patient not taking: Reported on 11/11/2019 11/06/19 12/06/19  Terrilee Croak, MD  ARTIFICIAL TEAR SOLUTION OP Place 1 drop into both eyes 2 (two) times daily.    [provider]  digoxin (LANOXIN) 0.25 MG tablet Take 1 tablet (0.25 mg total) by mouth daily. 11/07/19 12/07/19  Terrilee Croak, MD  famotidine (PEPCID) 20 MG  tablet One at bedtime Patient taking differently: Take 20 mg by mouth at bedtime. One at bedtime 08/02/16   Tanda Rockers, MD  finasteride (PROSCAR) 5 MG tablet Take 5 mg by mouth daily at 12 noon.     [provider]  fluticasone (FLONASE) 50 MCG/ACT nasal spray Place 2 sprays into both nostrils 2 (two) times daily. 10/24/17   [provider]  loratadine (CLARITIN) 10 MG tablet Take 10 mg by mouth daily at 12 noon.     [provider]  losartan (COZAAR) 50 MG tablet Take 50 mg by mouth daily with lunch. 08/13/19   [provider]  metoprolol succinate (TOPROL-XL) 100 MG 24 hr tablet Take 2 tablets (200 mg total) by mouth daily. 11/06/19 12/06/19  Terrilee Croak, MD  Multiple Vitamin (MULTIVITAMIN WITH MINERALS) TABS tablet Take 1 tablet by mouth daily at 12 noon.    [provider]  PHENobarbital (LUMINAL) 97.2 MG tablet Take 97.2 mg by mouth at bedtime.     [provider]  polyvinyl alcohol (ARTIFICIAL TEARS) 1.4 % ophthalmic solution Place 1 drop into both eyes daily with lunch.    [provider]  pravastatin (PRAVACHOL) 40 MG tablet Take 40 mg by mouth at bedtime.     [provider]    Allergies    Dilaudid [hydromorphone hcl], Dilaudid  [hydromorphone], Tegretol [carbamazepine], and Tegretol [carbamazepine]  Review of Systems   Review of Systems  Constitutional: Positive for fatigue. Negative for fever.  HENT: Negative for sore throat.   Eyes: Negative for visual disturbance.  Respiratory: Positive for cough. Negative for shortness of breath.   Cardiovascular: Negative for chest pain.  Gastrointestinal: Positive for nausea and vomiting. Negative for abdominal pain.  Genitourinary: Negative for dysuria.  Musculoskeletal: Positive for gait problem.  Skin: Positive for rash. Negative for wound.  Neurological: Negative for headaches.    Physical Exam Updated Vital Signs BP (!) 144/63   Pulse (!) 109   Temp 98.2 F (36.8 C) (Oral)   Resp 19   SpO2 95%   Physical Exam Vitals and nursing note reviewed.  Constitutional:      Appearance: Normal appearance. He is well-developed. He is obese.  HENT:     Head: Normocephalic and atraumatic.  Eyes:     Conjunctiva/sclera: Conjunctivae normal.  Cardiovascular:     Rate and Rhythm: Regular rhythm. Tachycardia present.     Heart sounds: No murmur heard.   Pulmonary:     Effort: Pulmonary effort is normal. No respiratory distress.     Breath sounds: Normal breath sounds.  Abdominal:     Palpations: Abdomen is soft.     Tenderness: There is no abdominal tenderness.  Musculoskeletal:        General: Swelling and tenderness present.     Cervical back: Neck supple.     Comments: Patient's left foot and ankle moderate swelling and tenderness.  There is some light erythema, no crepitus.  No open wounds.  Does have a thickened great toenail.  Skin:    General: Skin is warm and dry.  Neurological:     General: No focal deficit present.     Mental Status: He is alert.     ED Results / Procedures / Treatments   Labs (all labs ordered are listed, but only abnormal results are displayed) Labs Reviewed  COMPREHENSIVE METABOLIC PANEL - Abnormal; Notable for the following  components:      Result Value   Glucose, Bld 125 (*)  Calcium 8.6 (*)    Alkaline Phosphatase 128 (*)    All other components within normal limits  CBC WITH DIFFERENTIAL/PLATELET - Abnormal; Notable for the following components:   WBC 12.4 (*)    Neutro Abs 9.8 (*)    All other components within normal limits  SEDIMENTATION RATE - Abnormal; Notable for the following components:   Sed Rate 24 (*)    All other components within normal limits  C-REACTIVE PROTEIN - Abnormal; Notable for the following components:   CRP 1.3 (*)    All other components within normal limits  HEMOGLOBIN A1C - Abnormal; Notable for the following components:   Hgb A1c MFr Bld 5.9 (*)    All other components within normal limits  PROTIME-INR - Abnormal; Notable for the following components:   Prothrombin Time 18.0 (*)    INR 1.5 (*)    All other components within normal limits  PROTIME-INR - Abnormal; Notable for the following components:   Prothrombin Time 18.9 (*)    INR 1.6 (*)    All other components within normal limits  CBG MONITORING, ED - Abnormal; Notable for the following components:   Glucose-Capillary 106 (*)    All other components within normal limits  CULTURE, BLOOD (ROUTINE X 2)  CULTURE, BLOOD (ROUTINE X 2)  RESPIRATORY PANEL BY RT PCR (FLU A&B, COVID)  MRSA PCR SCREENING  LACTIC ACID, PLASMA  LACTIC ACID, PLASMA  DIGOXIN LEVEL  PHENOBARBITAL LEVEL  BRAIN NATRIURETIC PEPTIDE  MAGNESIUM  URINALYSIS, ROUTINE W REFLEX MICROSCOPIC  CBG MONITORING, ED  CBG MONITORING, ED  TROPONIN I (HIGH SENSITIVITY)    EKG EKG Interpretation  Date/Time:  Tuesday December 09 2019 18:28:28 EDT Ventricular Rate:  80 PR Interval:    QRS Duration: 147 QT Interval:  399 QTC Calculation: 461 R Axis:   -74 Text Interpretation: Atrial flutter with predominant 3:1 AV block RBBB and LAFB No significant change since last tracing Confirmed by Dorie Rank [phone]) on 12/09/2019 7:12:21 PM   Radiology DG  Chest 2 View  Result Date: 12/09/2019 CLINICAL DATA:  Foot pain and swelling EXAM: CHEST - 2 VIEW COMPARISON:  11/03/2019 FINDINGS: Cardiac shadow is enlarged but stable. The lungs are well aerated bilaterally. No focal infiltrate or sizable effusion is seen. No acute bony abnormality is noted. IMPRESSION: No acute abnormality seen. Electronically Signed   By: Inez Catalina M.D.   On: 12/09/2019 23:32   DG Foot Complete Left  Result Date: 12/09/2019 CLINICAL DATA:  No injury, entire foot is swollen and painful, patient states it has been hurting for 2 weeks EXAM: LEFT FOOT - COMPLETE 3+ VIEW COMPARISON:  None. FINDINGS: There is no evidence of fracture or dislocation. There is no evidence of arthropathy or other focal bone abnormality. Diffuse subcutaneus soft tissue edema. IMPRESSION: No acute displaced fracture or dislocation. Electronically Signed   By: Iven Finn M.D.   On: 12/09/2019 22:05    Procedures Procedures (including critical care time)  Medications Ordered in ED Medications  insulin aspart (novoLOG) injection 0-9 Units (0 Units Subcutaneous Not Given 12/10/19 9643)  Warfarin - Pharmacist Dosing Inpatient (has no administration in time range)  piperacillin-tazobactam (ZOSYN) IVPB 3.375 g (0 g Intravenous Stopped 12/09/19 2259)  vancomycin (VANCOREADY) IVPB 2000 mg/400 mL (0 mg Intravenous Stopped 12/10/19 0030)  furosemide (LASIX) injection 60 mg (60 mg Intravenous Given 12/09/19 2258)  warfarin (COUMADIN) tablet 4 mg (4 mg Oral Given 12/10/19 0230)    ED Course  I have reviewed  the triage vital signs and the nursing notes.  Pertinent labs & imaging results that were available during my care of the patient were reviewed by me and considered in my medical decision making (see chart for details).  Clinical Course as of Dec 10 1010  Tue Dec 09, 2019  0388 Discussed with Dr. Stann Mainland from [facility].  He said that he would reach out to Dr. Cay Schillings.  Dr. Stann Mainland called me back and  said the patient has an MRI ordered but does not know that has been completed yet.  He said that they were more worried that the patient called the office complaining of nausea and vomiting but that they were particularly worried about the foot.   [MB]  2147 Discussed with Dr. Roel Cluck [facility] hospital[facility] who will evaluate the patient for admission.  She asked if we get plain film x-rays of his foot to make sure there is no gas in the tissue.   [MB]    Clinical Course User Index [MB] Hayden Rasmussen, MD   MDM Rules/Calculators/A&P                         This patient complains of ongoing swelling and pain of left foot and ankle, today with nausea and vomiting and generalized fatigue; this involves an extensive number of treatment Options and is a complaint that carries with it a high risk of complications and Morbidity. The differential includes sepsis, Sirs, bacteremia, cellulitis, metabolic derangement, gastroenteritis  I ordered, reviewed and interpreted labs, which included CBC with mildly elevated white count, normal hemoglobin, chemistries fairly normal, ESR and CRP mildly elevated, Covid testing negative I ordered medication IV antibiotics I ordered imaging studies which included foot x-ray and I independently    visualized and interpreted imaging which showed soft tissue swelling no gas Additional history obtained from Dr. Stann Mainland orthopedics Previous records obtained and reviewed in epic, recent hospitalization for NSTEMI I consulted Dr. Roel Cluck [facility] and discussed lab and imaging findings  Critical Interventions: None  After the interventions stated above, I reevaluated the patient and found patient to be feeling less nauseous and has not vomited here.  Due to his multiple comorbid medical conditions reviewed with Dr. Roel Cluck and we felt that the best plan would be to admit the patient on IV antibiotics and then see if we get the MRI report and go from there.  Patient in  agreement with plan for admission.   Final Clinical Impression(s) / ED Diagnoses Final diagnoses:  Cellulitis of left foot  Non-intractable vomiting with nausea, unspecified vomiting type    Rx / DC Orders ED Discharge Orders    None       Hayden Rasmussen, MD 12/10/19 1012

## 2019-12-10 ENCOUNTER — Other Ambulatory Visit: Payer: Self-pay

## 2019-12-10 ENCOUNTER — Inpatient Hospital Stay (HOSPITAL_COMMUNITY): Payer: Medicare HMO

## 2019-12-10 ENCOUNTER — Encounter (HOSPITAL_COMMUNITY): Payer: Self-pay | Admitting: Internal Medicine

## 2019-12-10 DIAGNOSIS — I5023 Acute on chronic systolic (congestive) heart failure: Secondary | ICD-10-CM | POA: Diagnosis not present

## 2019-12-10 DIAGNOSIS — L03116 Cellulitis of left lower limb: Secondary | ICD-10-CM | POA: Diagnosis not present

## 2019-12-10 DIAGNOSIS — M7989 Other specified soft tissue disorders: Secondary | ICD-10-CM

## 2019-12-10 LAB — CBC WITH DIFFERENTIAL/PLATELET
Abs Immature Granulocytes: 0.04 10*3/uL (ref 0.00–0.07)
Basophils Absolute: 0.1 10*3/uL (ref 0.0–0.1)
Basophils Relative: 1 %
Eosinophils Absolute: 0.2 10*3/uL (ref 0.0–0.5)
Eosinophils Relative: 1 %
HCT: 42.7 % (ref 39.0–52.0)
Hemoglobin: 14.1 g/dL (ref 13.0–17.0)
Immature Granulocytes: 0 %
Lymphocytes Relative: 22 %
Lymphs Abs: 2.3 10*3/uL (ref 0.7–4.0)
MCH: 31.1 pg (ref 26.0–34.0)
MCHC: 33 g/dL (ref 30.0–36.0)
MCV: 94.3 fL (ref 80.0–100.0)
Monocytes Absolute: 0.8 10*3/uL (ref 0.1–1.0)
Monocytes Relative: 8 %
Neutro Abs: 7 10*3/uL (ref 1.7–7.7)
Neutrophils Relative %: 68 %
Platelets: 252 10*3/uL (ref 150–400)
RBC: 4.53 MIL/uL (ref 4.22–5.81)
RDW: 13.6 % (ref 11.5–15.5)
WBC: 10.4 10*3/uL (ref 4.0–10.5)
nRBC: 0 % (ref 0.0–0.2)

## 2019-12-10 LAB — COMPREHENSIVE METABOLIC PANEL
ALT: 21 U/L (ref 0–44)
AST: 27 U/L (ref 15–41)
Albumin: 3.3 g/dL — ABNORMAL LOW (ref 3.5–5.0)
Alkaline Phosphatase: 120 U/L (ref 38–126)
Anion gap: 11 (ref 5–15)
BUN: 16 mg/dL (ref 8–23)
CO2: 29 mmol/L (ref 22–32)
Calcium: 8.3 mg/dL — ABNORMAL LOW (ref 8.9–10.3)
Chloride: 98 mmol/L (ref 98–111)
Creatinine, Ser: 0.95 mg/dL (ref 0.61–1.24)
GFR calc non Af Amer: >60 mL/min (ref >60)
Glucose, Bld: 109 mg/dL — ABNORMAL HIGH (ref 70–99)
Potassium: 4.6 mmol/L (ref 3.5–5.1)
Sodium: 138 mmol/L (ref 135–145)
Total Bilirubin: 0.9 mg/dL (ref 0.3–1.2)
Total Protein: 7 g/dL (ref 6.5–8.1)

## 2019-12-10 LAB — PHENOBARBITAL LEVEL: Phenobarbital: 21.2 ug/mL (ref 15.0–30.0)

## 2019-12-10 LAB — CBG MONITORING, ED
Glucose-Capillary: 97 mg/dL (ref 70–99)
Glucose-Capillary: 106 mg/dL — ABNORMAL HIGH (ref 70–99)
Glucose-Capillary: 137 mg/dL — ABNORMAL HIGH (ref 70–99)
Glucose-Capillary: 106 mg/dL — ABNORMAL HIGH (ref 70–99)

## 2019-12-10 LAB — GLUCOSE, CAPILLARY: Glucose-Capillary: 126 mg/dL — ABNORMAL HIGH (ref 70–99)

## 2019-12-10 LAB — TSH: TSH: 1.14 u[IU]/mL (ref 0.350–4.500)

## 2019-12-10 LAB — MAGNESIUM: Magnesium: 1.9 mg/dL (ref 1.7–2.4)

## 2019-12-10 LAB — MRSA PCR SCREENING: MRSA by PCR: NEGATIVE

## 2019-12-10 LAB — PROTIME-INR
INR: 1.5 — ABNORMAL HIGH (ref 0.8–1.2)
INR: 1.6 — ABNORMAL HIGH (ref 0.8–1.2)
Prothrombin Time: 18 seconds — ABNORMAL HIGH (ref 11.4–15.2)
Prothrombin Time: 18.9 seconds — ABNORMAL HIGH (ref 11.4–15.2)

## 2019-12-10 LAB — DIGOXIN LEVEL: Digoxin Level: 1.4 ng/mL (ref 1.0–2.0)

## 2019-12-10 LAB — TROPONIN I (HIGH SENSITIVITY): Troponin I (High Sensitivity): 9 ng/L (ref <18)

## 2019-12-10 LAB — HEMOGLOBIN A1C
Hgb A1c MFr Bld: 5.9 % — ABNORMAL HIGH (ref 4.8–5.6)
Mean Plasma Glucose: 122.63 mg/dL

## 2019-12-10 LAB — PHOSPHORUS: Phosphorus: 3.2 mg/dL (ref 2.5–4.6)

## 2019-12-10 MED ORDER — SACUBITRIL-VALSARTAN 24-26 MG PO TABS
1.0000 | ORAL_TABLET | Freq: Two times a day (BID) | ORAL | Status: DC
  Administered 2019-12-10 – 2019-12-12 (×5): 1 via ORAL
  Filled 2019-12-10 (×6): qty 1

## 2019-12-10 MED ORDER — FUROSEMIDE 10 MG/ML IJ SOLN
60.0000 mg | Freq: Once | INTRAMUSCULAR | Status: AC
  Administered 2019-12-10: 60 mg via INTRAVENOUS
  Filled 2019-12-10: qty 8

## 2019-12-10 MED ORDER — ALBUTEROL SULFATE (2.5 MG/3ML) 0.083% IN NEBU: 2.5000 mg | INHALATION_SOLUTION | RESPIRATORY_TRACT | Status: DC | PRN

## 2019-12-10 MED ORDER — FUROSEMIDE 10 MG/ML IJ SOLN: 40.0000 mg | Freq: Two times a day (BID) | INTRAMUSCULAR | Status: DC

## 2019-12-10 MED ORDER — DIGOXIN 125 MCG PO TABS
0.2500 mg | ORAL_TABLET | Freq: Every day | ORAL | Status: DC
  Administered 2019-12-10 – 2019-12-11 (×2): 0.25 mg via ORAL
  Filled 2019-12-10: qty 1
  Filled 2019-12-10: qty 2

## 2019-12-10 MED ORDER — FAMOTIDINE 20 MG PO TABS
20.0000 mg | ORAL_TABLET | Freq: Every day | ORAL | Status: DC
  Administered 2019-12-10 – 2019-12-11 (×2): 20 mg via ORAL
  Filled 2019-12-10 (×2): qty 1

## 2019-12-10 MED ORDER — VANCOMYCIN HCL 1250 MG/250ML IV SOLN
1250.0000 mg | INTRAVENOUS | Status: DC
  Administered 2019-12-10: 1250 mg via INTRAVENOUS
  Filled 2019-12-10 (×2): qty 250

## 2019-12-10 MED ORDER — AMIODARONE HCL 200 MG PO TABS
200.0000 mg | ORAL_TABLET | Freq: Every day | ORAL | Status: DC
  Administered 2019-12-10 – 2019-12-12 (×3): 200 mg via ORAL
  Filled 2019-12-10 (×3): qty 1

## 2019-12-10 MED ORDER — LORATADINE 10 MG PO TABS
10.0000 mg | ORAL_TABLET | Freq: Every day | ORAL | Status: DC
  Administered 2019-12-10 – 2019-12-12 (×3): 10 mg via ORAL
  Filled 2019-12-10 (×3): qty 1

## 2019-12-10 MED ORDER — METOPROLOL SUCCINATE ER 50 MG PO TB24
100.0000 mg | ORAL_TABLET | Freq: Every day | ORAL | Status: DC
  Administered 2019-12-10 – 2019-12-12 (×3): 100 mg via ORAL
  Filled 2019-12-10 (×4): qty 2

## 2019-12-10 MED ORDER — WARFARIN - PHARMACIST DOSING INPATIENT: Freq: Every day | Status: DC

## 2019-12-10 MED ORDER — DOCUSATE SODIUM 100 MG PO CAPS
100.0000 mg | ORAL_CAPSULE | Freq: Two times a day (BID) | ORAL | Status: DC
  Administered 2019-12-10 – 2019-12-12 (×2): 100 mg via ORAL
  Filled 2019-12-10 (×4): qty 1

## 2019-12-10 MED ORDER — HYDROCODONE-ACETAMINOPHEN 5-325 MG PO TABS
1.0000 | ORAL_TABLET | ORAL | Status: DC | PRN
  Administered 2019-12-10 – 2019-12-11 (×2): 2 via ORAL
  Filled 2019-12-10 (×3): qty 2
  Filled 2019-12-10: qty 1

## 2019-12-10 MED ORDER — SODIUM CHLORIDE 0.9 % IV SOLN
2.0000 g | INTRAVENOUS | Status: AC
  Administered 2019-12-10: 2 g via INTRAVENOUS
  Filled 2019-12-10: qty 2

## 2019-12-10 MED ORDER — DIGOXIN 250 MCG PO TABS: 0.2500 mg | ORAL_TABLET | Freq: Every day | ORAL | Status: DC

## 2019-12-10 MED ORDER — PRAVASTATIN SODIUM 20 MG PO TABS
40.0000 mg | ORAL_TABLET | Freq: Every day | ORAL | Status: DC
  Administered 2019-12-10 – 2019-12-11 (×2): 40 mg via ORAL
  Filled 2019-12-10 (×2): qty 2

## 2019-12-10 MED ORDER — WARFARIN SODIUM 4 MG PO TABS
4.0000 mg | ORAL_TABLET | Freq: Once | ORAL | Status: AC
  Administered 2019-12-10: 4 mg via ORAL
  Filled 2019-12-10: qty 1

## 2019-12-10 MED ORDER — FINASTERIDE 5 MG PO TABS
5.0000 mg | ORAL_TABLET | Freq: Every day | ORAL | Status: DC
  Administered 2019-12-10 – 2019-12-12 (×3): 5 mg via ORAL
  Filled 2019-12-10 (×3): qty 1

## 2019-12-10 MED ORDER — PHENOBARBITAL 97.2 MG PO TABS
97.2000 mg | ORAL_TABLET | Freq: Every day | ORAL | Status: DC
  Administered 2019-12-10 – 2019-12-11 (×2): 97.2 mg via ORAL
  Filled 2019-12-10 (×2): qty 1

## 2019-12-10 MED ORDER — ACETAMINOPHEN 325 MG PO TABS: 650.0000 mg | ORAL_TABLET | Freq: Four times a day (QID) | ORAL | Status: DC | PRN

## 2019-12-10 MED ORDER — SODIUM CHLORIDE 0.9% FLUSH
3.0000 mL | Freq: Two times a day (BID) | INTRAVENOUS | Status: DC
  Administered 2019-12-10 – 2019-12-12 (×4): 3 mL via INTRAVENOUS

## 2019-12-10 MED ORDER — LOSARTAN POTASSIUM 50 MG PO TABS: 50.0000 mg | ORAL_TABLET | Freq: Every day | ORAL | Status: DC

## 2019-12-10 MED ORDER — SODIUM CHLORIDE 0.9 % IV SOLN
2.0000 g | Freq: Two times a day (BID) | INTRAVENOUS | Status: DC
  Administered 2019-12-11 (×3): 2 g via INTRAVENOUS
  Filled 2019-12-10 (×3): qty 2

## 2019-12-10 MED ORDER — ACETAMINOPHEN 650 MG RE SUPP: 650.0000 mg | Freq: Four times a day (QID) | RECTAL | Status: DC | PRN

## 2019-12-10 MED ORDER — METOPROLOL SUCCINATE ER 50 MG PO TB24
200.0000 mg | ORAL_TABLET | Freq: Every day | ORAL | Status: DC
Start: 2019-12-10 — End: 2019-12-10

## 2019-12-10 MED ORDER — POTASSIUM CHLORIDE CRYS ER 20 MEQ PO TBCR
20.0000 meq | EXTENDED_RELEASE_TABLET | Freq: Two times a day (BID) | ORAL | Status: AC
  Administered 2019-12-10 (×2): 20 meq via ORAL
  Filled 2019-12-10 (×2): qty 1

## 2019-12-10 NOTE — ED Notes (Signed)
Pt states he is feeling "sick to his stomach since breakfast." Pt also claims that he "may have eaten too fast." Pt given emesis bag as a precaution.

## 2019-12-10 NOTE — Progress Notes (Signed)
PROGRESS NOTE  Ruben Murray  DOB: 26-Jun-1945  PCP: [name], MD OFH:[id number]  DOA: 12/09/2019  LOS: 1 day   Chief Complaint  Patient presents with  . Cellulitis   Brief narrative: Ruben Murray is a 74 y.o. male with medical history significant of HLD, prediabetes, HTN, BPH, paroxysmal atrial fibrillation on anticoagulation,  nonischemic cardiomyopathy, untreated sleep apnea, history of seizure, Covid positive in August 2021 status post monoclonal antibodies, COPD, morbid obesity, acquired thrombophilia, dilated cardiomyopathy. Patient presented with ED on 10/5 with complaint of 4-week history of left foot swelling.   Patient was admitted in August 2021 for syncope.  Echocardiogram in that admission showed EF of 25%, he underwent cardiac cath that showed normal coronary arteries.  He was discharged home on metoprolol, Eliquis, amiodarone, digoxin but not on any diuretics. For the past few weeks, patient has been having increasing left foot pain with swelling.  He was seen by PCP and suspected gout, referred to orthopedic physician who started him on antibiotics for cellulitis.  Patient reports he has not been improving with antibiotics.  MRI of left foot was obtained on 10/4.   Patient states he was called with a report and asked to go to ED for IV antibiotics.  In the ED, he was noted to have 1+ bilateral pitting pedal edema. He was started on IV Lasix as well as IV antibiotics and admitted to hospital service. Cardiology consultation was obtained  Subjective: Patient was seen and examined this morning.  Pleasant elderly Caucasian male.  Propped up in bed.  Not in distress.  No new symptoms.  Assessment/Plan: Acute on chronic systolic heart failure Nonischemic cardiomyopathy Essential hypertension -Presented with bilateral pedal edema.  Last echo with EF 25%.  Not on Lasix at home. -Cardiology consult appreciated.  Given 1 dose of IV Lasix this morning. -Monitor intake  output, daily weights, blood pressure, renal function and electrolytes.  Persistent a flutter -Continue metoprolol, amiodarone and digoxin. -Patient was last discharged on Eliquis but was switched to Coumadin as an outpatient because of cost issues. -Subtherapeutic INR at presentation.  Coumadin has been resumed.  Left leg cellulitis -WBC elevated 12.4, lactic acid normal, not septic -Currently on IV cefepime and IV vancomycin in the ED. -Continue the same. -I think should be able to de-escalate antibiotics soon. -Plain x-ray obtained on admission did not show any evidence of osteomyelitis or foreign objects. -I discussed with nurse at [facility] Dr. Charlestine Night office.  MRI left foot on 10/4 did not show any bony abnormality and confirmed cellulitis only. Recent Labs  Lab 12/09/19 1654 12/09/19 1925  WBC 12.4*  --   LATICACIDVEN 1.4 1.8   Diabetes mellitus 2  -A1c 6.2 on 9//1 -Glucose controlled.  Trend as below.  Continue to monitor on sliding scale insulin. Recent Labs  Lab 12/09/19 2213 12/10/19 0352 12/10/19 0810 12/10/19 1138  GLUCAP 96 97 106* 137*   History of seizure disorder - on phenobarbital  Morbid obesity - There is no height or weight on file to calculate BMI. Patient has been advised to make an attempt to improve diet and exercise patterns to aid in weight loss.  Obstructive sleep apnea -recommend CPAP      Mobility: Needs PT evaluation. Code Status:   Code Status: DNR  Nutritional status: There is no height or weight on file to calculate BMI.     Diet Order            Diet Carb Modified Fluid consistency:  Thin; Room service appropriate? Yes  Diet effective now                 DVT prophylaxis: Coumadin   Antimicrobials:  IV cefepime, IV vancomycin Fluid: None Consultants: Cardiology, orthopedics Family Communication:  None at bedside  Status is: Inpatient  Remains inpatient appropriate because:Ongoing diagnostic testing needed not  appropriate for outpatient work up and IV treatments appropriate due to intensity of illness or inability to take PO   Dispo: The patient is from: Home              Anticipated d/c is to: Home -likely with PT              Anticipated d/c date is: 3 days              Patient currently is not medically stable to d/c.       Infusions:  . [START ON 12/11/2019] ceFEPime (MAXIPIME) IV    . vancomycin      Scheduled Meds: . amiodarone  200 mg Oral Daily  . digoxin  0.25 mg Oral Daily  . docusate sodium  100 mg Oral BID  . famotidine  20 mg Oral QHS  . finasteride  5 mg Oral Q1200  . furosemide  60 mg Intravenous Once  . insulin aspart  0-9 Units Subcutaneous Q4H  . loratadine  10 mg Oral Q1200  . metoprolol succinate  100 mg Oral Daily  . PHENobarbital  97.2 mg Oral QHS  . potassium chloride  20 mEq Oral BID  . pravastatin  40 mg Oral QHS  . sacubitril-valsartan  1 tablet Oral BID  . sodium chloride flush  3 mL Intravenous Q12H  . Warfarin - Pharmacist Dosing Inpatient   Does not apply q1600    Antimicrobials: Anti-infectives (From admission, onward)   Start     Dose/Rate Route Frequency Ordered Stop   12/11/19 0000  ceFEPIme (MAXIPIME) 2 g in sodium chloride 0.9 % 100 mL IVPB        2 g 200 mL/hr over 30 Minutes Intravenous Every 12 hours 12/10/19 1156     12/10/19 2200  vancomycin (VANCOREADY) IVPB 1250 mg/250 mL        1,250 mg 166.7 mL/hr over 90 Minutes Intravenous Every 24 hours 12/10/19 1153     12/10/19 1200  ceFEPIme (MAXIPIME) 2 g in sodium chloride 0.9 % 100 mL IVPB        2 g 200 mL/hr over 30 Minutes Intravenous NOW 12/10/19 1150 12/10/19 1254   12/09/19 2215  vancomycin (VANCOREADY) IVPB 2000 mg/400 mL        2,000 mg 200 mL/hr over 120 Minutes Intravenous  Once 12/09/19 2204 12/10/19 0030   12/09/19 2200  piperacillin-tazobactam (ZOSYN) IVPB 3.375 g        3.375 g 100 mL/hr over 30 Minutes Intravenous  Once 12/09/19 2147 12/09/19 2259      PRN meds:      Objective: Vitals:   12/10/19 1034 12/10/19 1225  BP: (!) 121/59 139/62  Pulse: 80 90  Resp: 18   Temp:    SpO2: 96%    No intake or output data in the 24 hours ending 12/10/19 1310 There were no vitals filed for this visit. Weight change:  There is no height or weight on file to calculate BMI.   Physical Exam: General exam: Appears calm and comfortable.  Not in physical distress Skin: No rashes, lesions or ulcers. HEENT: Atraumatic, normocephalic, supple  neck, no obvious bleeding Lungs: Clear to auscultate bilaterally CVS: Regular rhythm, controlled rate, no murmur GI/Abd soft, nontender, nondistended, bowel sound present CNS: Alert, awake, oriented x3 Psychiatry: Mood appropriate Extremities: Pedal edema 1+ bilaterally, left leg with cellulitis  Data Review: I have personally reviewed the laboratory data and studies available.  Recent Labs  Lab 12/09/19 1654  WBC 12.4*  NEUTROABS 9.8*  HGB 14.1  HCT 41.9  MCV 93.3  PLT 240   Recent Labs  Lab 12/09/19 1654  NA 138  K 4.2  CL 102  CO2 25  GLUCOSE 125*  BUN 21  CREATININE 1.10  CALCIUM 8.6*  MG 1.8    F/u labs ordered  Signed, Terrilee Croak, MD [facility] 12/10/2019

## 2019-12-10 NOTE — ED Notes (Signed)
Pt switched to a condom cath with bag.

## 2019-12-10 NOTE — ED Notes (Signed)
Meal tray placed on bedside table and given to pt.

## 2019-12-10 NOTE — Progress Notes (Signed)
ANTICOAGULATION CONSULT NOTE - Initial Consult  Pharmacy Consult for warfarin Indication: atrial fibrillation  Allergies  Allergen Reactions   Dilaudid [Hydromorphone Hcl] Other (See Comments)    Makes pt hyper    Dilaudid [Hydromorphone] Other (See Comments)    hyperactiviity   Tegretol [Carbamazepine] Hives   Tegretol [Carbamazepine] Hives    Patient Measurements:     Vital Signs: Temp: 98.2 F (36.8 C) (10/05 1644) Temp Source: Oral (10/05 1644) BP: 137/58 (10/05 2230) Pulse Rate: 84 (10/05 2230)  Labs: Recent Labs    12/09/19 1654 12/10/19 0002  HGB 14.1  --   HCT 41.9  --   PLT 240  --   LABPROT  --  18.0*  INR  --  1.5*  CREATININE 1.10  --   TROPONINIHS  --  9    CrCl cannot be calculated (Unknown ideal weight.).   Medical History: Past Medical History:  Diagnosis Date   Acquired thrombophilia ([place]) 01/15/2019   Aortic atherosclerosis ([place])    Atrial fibrillation with RVR ([place]) 10/29/2017   CAP (community acquired pneumonia) 03/06/2016   Cardiomyopathy ([place])    a. EF 40-45% in 04/2016, etiology not defined, managed medically.   CHF exacerbation ([place]) 10/29/2017   Chronic laryngitis 10/03/2016   COPD GOLD II  08/02/2016   Quit smoking 2000  PFT's  07/10/2016  FEV1 1.98 (70 % ) ratio 67  p 19 % improvement from saba p nothing  prior to study while of coreg so rec as of 08/02/2016 try off coreg and on bisoprolol      Dysphonia 10/03/2016   Essential hypertension 03/06/2016   Changed from coreg to bisoprolol due to copd with reversible component  08/02/2016 >>>    Fever 03/12/2015   History of cardioversion    Hoarseness 08/31/2016   Referred to ent 08/31/2016 >>> seen 10/03/16 [name] dx gerd and rhinitis medicamentosa   > improved on f/u 01/31/17 on gerd rx/ flonase and off afrin   Hyperlipidemia    Hypertension    Laryngopharyngeal reflux (LPR) 10/03/2016   Moderate persistent asthma 08/02/2016   Morbid (severe) obesity due to excess calories  ([place]) 08/02/2016   Myocardial infarction ([place]) 1980-early 2000s X 2   "mild ones" (03/06/2016)   Nasal polyps 10/03/2016   OSA on CPAP    a. pt states was told by Dr. Maxwell Caul that he would not require CPAP as long as he continued to sleep in recliner which he does for his back.   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   Rhinitis medicamentosa 10/03/2016   Right thyroid nodule 03/06/2016   Seizures ([place])    "take RX daily" (03/06/2016)   Sensorineural hearing loss (SNHL) of both ears 01/31/2017     Assessment:  74 y.o. male with medical history significant of HLD, prediabetes, HTN, BPH, paroxysmal atrial fibrillation on anticoagulation, Covid positive in August 2021 status post monoclonal antibodies,, COPD, morbid severe obesity, acquired thrombophilia, dilated cardiomyopathy.  Pt discharged home on elquis but could not afford and switched back to warfarin.  Home dose 27m daily.  Last dose unknown.  12/10/2019 INR 1.5 subtherapeutic CBC WNL  Goal of Therapy:  INR 2-3    Plan:  Warfarin 436mnow Daily INR  ElDolly RiasPh 12/10/2019, 12:38 AM

## 2019-12-10 NOTE — Progress Notes (Signed)
Pharmacy Antibiotic Note  Ruben Murray is a 74 y.o. male admitted on 12/09/2019 with cellulitis.  Pharmacy has been consulted for vancomycin and cefepime dosing.  Plan: Cefepime 2g IV q12h Vancomycin 2g x 1 given in ED, continue with 1274m IV q24h Consider trough at steady state Follow up renal function & cultures   Temp (24hrs), Avg:98.2 F (36.8 C), Min:98.2 F (36.8 C), Max:98.2 F (36.8 C)  Recent Labs  Lab 12/09/19 1654 12/09/19 1925  WBC 12.4*  --   CREATININE 1.10  --   LATICACIDVEN 1.4 1.8    CrCl cannot be calculated (Unknown ideal weight.).    Allergies  Allergen Reactions  . Dilaudid [Hydromorphone Hcl] Other (See Comments)    Makes pt hyper   . Dilaudid [Hydromorphone] Other (See Comments)    hyperactiviity  . Tegretol [Carbamazepine] Hives  . Tegretol [Carbamazepine] Hives    Antimicrobials this admission: 10/5 Vanc/Zosyn x 1 10/6 Vanc >> 10/6 Cefepime >>  Dose adjustments this admission:  Microbiology results: 10/5 BCx: ngtd 10/5 Flu: neg 10/5 COVID: neg 10/5 MRSA PCR: neg  Thank you for allowing pharmacy to be a part of this patient's care.  EPeggyann Juba PharmD, BCPS Pharmacy: [phone]/08/2019 11:57 AM

## 2019-12-10 NOTE — Consult Note (Addendum)
Cardiology Consultation:   Patient ID: Ruben Murray MRN: [id number]; DOB: Jul 18, 1945  Admit date: 12/09/2019 Date of Consult: 12/10/2019  Primary Care Provider: [name], MD [facility] Cardiologist: [name], MD  [facility] Electrophysiologist:  [name], MD    Patient Profile:   Ruben Murray is a 74 y.o. male with a hx of persistent atrial flutter, nonischemic cardiomyopathy, untreated obstructive sleep apnea, history of seizure, hypertension, prediabetes, hyperlipidemia, COPD, and recently diagnosed COVID 30 in August 2021 who is being seen today for the evaluation of acute on chronic systolic heart failure at the request of Dr. Pietro Cassis.  History of Present Illness:   Ruben Murray is a 74 year old male with past medical history of persistent atrial flutter, nonischemic cardiomyopathy, untreated obstructive sleep apnea, history of seizure, hypertension, prediabetes, hyperlipidemia, COPD, and recently diagnosed COVID 14 in August 2021.  Patient has had at least 4 DC cardioversions in the past.  The most recent cardioversion was performed in February 2021.  Over the years, his ejection fraction has also been variable.  In February 2018, his EF was 40 to 45%, unfortunately, EF dropped down to 25% by August 2019.  Repeat echocardiogram obtained in January 2021 did show improvement of ejection fraction back up to 40 to 45%.  He was diagnosed with COVID-19 and he receives monoclonal antibody.  He was admitted with syncope in August 2021.  EKG on arrival showed wide-complex tachycardia with atrial flutter with RVR.  He was treated with IV Cardizem for rate control.  Troponin trended up to greater than 4000.  Echocardiogram obtained on 11/04/2019 shows his EF is back down to 25%, moderate MR, mild AI. Patient subsequently underwent left and right heart cath by Dr. Claiborne Billings on 11/06/2019.  Cardiac catheterization revealed normal coronary arteries with codominant system, PA pressure  34/22 with mean pressure 44mHg.  Medical therapy was recommended for his nonischemic cardiomyopathy.  Postprocedure, he was started on Eliquis.  Metoprolol was increased for rate control.  He is also on amiodarone and digoxin.  He was not discharged on any diuretic.  Since discharge, he has been switched back to Coumadin due to cost issue.  Coumadin is [facility]  For the past few weeks, he has been having increasing left foot pain with swelling.  He denies any recent chest pain, orthopnea or PND.  He did mention that he has shortness of breath with minimal exertion.  His PCP initially thought it was gout, however his orthopedic physician felt this is likely cellulitis.  [facility].  INR on arrival has been subtherapeutic.  Chest x-ray shows no acute process.  On exam, he has 1+ pitting edema in bilateral lower extremity.  He was felt to be volume overloaded.  Cardiology has been consulted for acute on chronic systolic heart failure.  While in the ED, he has been given a single dose of 60 mg IV Lasix.   Past Medical History:  Diagnosis Date  . Acquired thrombophilia ([place] 01/15/2019  . Aortic atherosclerosis ([place]   . Atrial fibrillation with RVR ([place] 10/29/2017  . CAP (community acquired pneumonia) 03/06/2016  . Cardiomyopathy ([place]    a. EF 40-45% in 04/2016, etiology not defined, managed medically.  . CHF exacerbation ([place] 10/29/2017  . Chronic laryngitis 10/03/2016  . COPD GOLD II  08/02/2016   Quit smoking 2000  PFT's  07/10/2016  FEV1 1.98 (70 % )  ratio 67  p 19 % improvement from saba p nothing  prior to study while of coreg so rec as of 08/02/2016 try off coreg and on bisoprolol     . Dysphonia 10/03/2016  . Essential hypertension 03/06/2016   Changed from coreg to bisoprolol due to copd with reversible component  08/02/2016 >>>   . History of cardioversion   . Hoarseness 08/31/2016   Referred to  ent 08/31/2016 >>> seen 10/03/16 [name] dx gerd and rhinitis medicamentosa   > improved on f/u 01/31/17 on gerd rx/ flonase and off afrin  . Hyperlipidemia   . Hypertension   . Laryngopharyngeal reflux (LPR) 10/03/2016  . Moderate persistent asthma 08/02/2016  . Morbid (severe) obesity due to excess calories ([place]) 08/02/2016  . Myocardial infarction [facility]) 1980-early 2000s X 2   "mild ones" (03/06/2016)  . Nasal polyps 10/03/2016  . OSA on CPAP    a. pt states was told by Dr. Maxwell Caul that he would not require CPAP as long as he continued to sleep in recliner which he does for his back.  . Paroxysmal atrial fibrillation ([place])   . Pneumonia    "when I was a kid" (03/06/2016)  . Prolonged QT interval 10/29/2017  . RBBB   . 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 Rhinitis medicamentosa 10/03/2016  . Right thyroid nodule 03/06/2016  . Seizures ([place])    "take RX daily" (03/06/2016)  . Sensorineural hearing loss (SNHL) of both ears 01/31/2017    Past Surgical History:  Procedure Laterality Date  . CARDIOVERSION N/A 12/13/2017   Procedure: CARDIOVERSION;  Surgeon: [name], MD;  Location: [place];  Service: Cardiovascular;  Laterality: N/A;  . CARDIOVERSION N/A 09/25/2018   Procedure: CARDIOVERSION;  Surgeon: [name], MD;  Location: [facility] ENDOSCOPY;  Service: Cardiovascular;  Laterality: N/A;  . CARDIOVERSION N/A 11/04/2018   Procedure: CARDIOVERSION;  Surgeon: [name], MD;  Location: [facility] ENDOSCOPY;  Service: Cardiovascular;  Laterality: N/A;  . CARDIOVERSION N/A 04/11/2019   Procedure: CARDIOVERSION;  Surgeon: [name], MD;  Location: [facility] ENDOSCOPY;  Service: Cardiovascular;  Laterality: N/A;  . CIRCUMCISION    . JOINT REPLACEMENT    . PERCUTANEOUS PINNING TOE FRACTURE Left    "big toe  . REPLACEMENT TOTAL KNEE Left   . RIGHT/LEFT HEART CATH AND CORONARY ANGIOGRAPHY N/A 11/06/2019   Procedure: RIGHT/LEFT HEART CATH AND CORONARY ANGIOGRAPHY;  Surgeon: [name], MD;   Location: [place] CV LAB;  Service: Cardiovascular;  Laterality: N/A;  . TONSILLECTOMY AND ADENOIDECTOMY       Home Medications:  Prior to Admission medications   Medication Sig Start Date End Date Taking? Authorizing Provider  acetaminophen (TYLENOL) 500 MG tablet Take 500-1,000 mg by mouth daily as needed for moderate pain.    Yes [provider]  amiodarone (PACERONE) 200 MG tablet Take 1 tablet (200 mg total) by mouth daily. 05/13/19  Yes Camnitz, Ocie Doyne, MD  ARTIFICIAL TEAR SOLUTION OP Place 1 drop into both eyes 2 (two) times daily.   Yes [provider]  digoxin (LANOXIN) 0.25 MG tablet Take 1 tablet (0.25 mg total) by mouth daily. 11/07/19 12/10/19 Yes Dahal, Marlowe Aschoff, MD  famotidine (PEPCID) 20 MG tablet One at bedtime Patient taking differently: Take 20 mg by mouth at bedtime. One at bedtime 08/02/16  Yes Tanda Rockers, MD  finasteride (PROSCAR) 5 MG tablet Take 5 mg by mouth daily at 12 noon.    Yes [provider]  fluticasone (  FLONASE) 50 MCG/ACT nasal spray Place 2 sprays into both nostrils 2 (two) times daily. 10/24/17  Yes [provider]  loratadine (CLARITIN) 10 MG tablet Take 10 mg by mouth daily at 12 noon.    Yes [provider]  losartan (COZAAR) 50 MG tablet Take 50 mg by mouth daily with lunch. 08/13/19  Yes [provider]  metoprolol succinate (TOPROL-XL) 100 MG 24 hr tablet Take 2 tablets (200 mg total) by mouth daily. 11/06/19 12/10/19 Yes Dahal, Marlowe Aschoff, MD  Multiple Vitamin (MULTIVITAMIN WITH MINERALS) TABS tablet Take 1 tablet by mouth daily at 12 noon.   Yes [provider]  PHENobarbital (LUMINAL) 97.2 MG tablet Take 97.2 mg by mouth at bedtime.    Yes [provider]  polyvinyl alcohol (ARTIFICIAL TEARS) 1.4 % ophthalmic solution Place 1 drop into both eyes daily with lunch.   Yes [provider]  pravastatin (PRAVACHOL) 40 MG tablet Take 40 mg by mouth at bedtime.    Yes [provider]  warfarin (COUMADIN) 3 MG tablet Take 3 mg by mouth See admin instructions. Mon,Tues,Wed,Thurs,Fri,Sat only   Yes [provider]    Inpatient Medications: Scheduled Meds: . digoxin  0.25 mg Oral Daily  . insulin aspart  0-9 Units Subcutaneous Q4H  . metoprolol succinate  100 mg Oral Daily  . Warfarin - Pharmacist Dosing Inpatient   Does not apply q1600   Continuous Infusions: . ceFEPime (MAXIPIME) IV    . [START ON 12/11/2019] ceFEPime (MAXIPIME) IV    . vancomycin     PRN Meds:   Allergies:    Allergies  Allergen Reactions  . Dilaudid [Hydromorphone Hcl] Other (See Comments)    Makes pt hyper   . Dilaudid [Hydromorphone] Other (See Comments)    hyperactiviity  . Tegretol [Carbamazepine] Hives  . Tegretol [Carbamazepine] Hives    Social History:   Social History   Socioeconomic History  . Marital status: Married    Spouse name: Not on file  . Number of children: Not on file  . Years of education: Not on file  . Highest education level: Not on file  Occupational History  . Not on file  Tobacco Use  . Smoking status: Former Smoker    Packs/day: 3.00    Years: 48.00    Pack years: 144.00    Types: Cigarettes    Quit date: 2000    Years since quitting: 21.7  . Smokeless tobacco: Never Used  Vaping Use  . Vaping Use: Never used  Substance and Sexual Activity  . Alcohol use: Never  . Drug use: Never  . Sexual activity: Never  Other Topics Concern  . Not on file  Social History Narrative   ** Merged History Encounter **       Social Determinants of Health   Financial Resource Strain:   . Difficulty of Paying Living Expenses: Not on file  Food Insecurity: No Food Insecurity  . Worried About Charity fundraiser in the Last Year: Never true  . Ran Out of Food in the Last Year: Never true  Transportation Needs: No Transportation Needs  . Lack of Transportation (Medical): No  . Lack of Transportation (Non-Medical): No  Physical  Activity:   . Days of Exercise per Week: Not on file  . Minutes of Exercise per Session: Not on file  Stress:   . Feeling of Stress : Not on file  Social Connections:   . Frequency of Communication with Friends and  Family: Not on file  . Frequency of Social Gatherings with Friends and Family: Not on file  . Attends Religious Services: Not on file  . Active Member of Clubs or Organizations: Not on file  . Attends Archivist Meetings: Not on file  . Marital Status: Not on file  Intimate Partner Violence:   . Fear of Current or Ex-Partner: Not on file  . Emotionally Abused: Not on file  . Physically Abused: Not on file  . Sexually Abused: Not on file    Family History:    Family History  Problem Relation Age of Onset  . Hypertension Mother   . Other Father        sudden cardiac arrest     ROS:  Please see the history of present illness.   All other ROS reviewed and negative.     Physical Exam/Data:   Vitals:   12/10/19 0500 12/10/19 0530 12/10/19 0815 12/10/19 1034  BP: 126/84 117/66 111/74 (!) 121/59  Pulse: 86 82 99 80  Resp: 14 14 (!) 22 18  Temp:   98.2 F (36.8 C)   TempSrc:   Oral   SpO2: 97% 97% 99% 96%   No intake or output data in the 24 hours ending 12/10/19 1206 Last 3 Weights 11/06/2019 11/05/2019 11/04/2019  Weight (lbs) 256 lb 4.8 oz 254 lb 9.6 oz 255 lb  Weight (kg) 116.257 kg 115.486 kg 115.667 kg     There is no height or weight on file to calculate BMI.  General:  Well nourished, well developed, in no acute distress HEENT: normal Lymph: no adenopathy Neck: no JVD Endocrine:  No thryomegaly Vascular: No carotid bruits; FA pulses 2+ bilaterally without bruits  Cardiac:  irregular; no murmur  Lungs:  clear to auscultation bilaterally, no wheezing, rhonchi or rales  Abd: soft, nontender, no hepatomegaly  Ext: 1+ LE edema Musculoskeletal:  No deformities, BUE and BLE strength normal and equal Skin: warm and dry  Neuro:  CNs 2-12 intact, no  focal abnormalities noted Psych:  Normal affect   EKG:  The EKG was personally reviewed and demonstrates:  Atrial flutter Telemetry:  Telemetry was personally reviewed and demonstrates:  Atrial flutter with HR initially in the 70s, now in the 110s  Relevant CV Studies:  Echo 11/04/2019 1. Left ventricular ejection fraction, by estimation, is 20 to 25%. The  left ventricle has severely decreased function. The left ventricle  demonstrates global hypokinesis. The left ventricular internal cavity size  was moderately dilated. Left  ventricular diastolic parameters are indeterminate.  2. Right ventricular systolic function is moderately reduced. The right  ventricular size is moderately enlarged. There is mildly elevated  pulmonary artery systolic pressure. The estimated right ventricular  systolic pressure is 31.5 mmHg.  3. Left atrial size was mildly dilated.  4. Right atrial size was moderately dilated.  5. The mitral valve is normal in structure. Mild mitral valve  regurgitation. No evidence of mitral stenosis.  6. The aortic valve is tricuspid. Aortic valve regurgitation is mild. No  aortic stenosis is present.  7. The inferior vena cava is dilated in size with <50% respiratory  variability, suggesting right atrial pressure of 15 mmHg.    Cath 11/06/2019 Findings are consistent with a dilated nonischemic cardiomyopathy.  Normal coronary arteries with a codominant system.  Mild right heart pressure elevation with mild pulmonary hypertension; PA 34/22, mean PA pressure 28 mm.  Atrial flutter with variable block and rapid ventricular response.  RECOMMENDATION:  Guideline directed medical therapy for nonischemic cardiomyopathy.  In this patient with recent Covid 19 positivity consider post Covid myocarditis.   Laboratory Data:  High Sensitivity Troponin:   Recent Labs  Lab 12/10/19 0002  TROPONINIHS 9     Chemistry Recent Labs  Lab 12/09/19 1654  NA 138  K 4.2   CL 102  CO2 25  GLUCOSE 125*  BUN 21  CREATININE 1.10  CALCIUM 8.6*  GFRNONAA >60  ANIONGAP 11    Recent Labs  Lab 12/09/19 1654  PROT 7.3  ALBUMIN 3.9  AST 24  ALT 21  ALKPHOS 128*  BILITOT 0.7   Hematology Recent Labs  Lab 12/09/19 1654  WBC 12.4*  RBC 4.49  HGB 14.1  HCT 41.9  MCV 93.3  MCH 31.4  MCHC 33.7  RDW 13.5  PLT 240   BNP Recent Labs  Lab 12/09/19 1654  BNP 80.6    DDimer No results for input(s): DDIMER in the last 168 hours.   Radiology/Studies:  DG Chest 2 View  Result Date: 12/09/2019 CLINICAL DATA:  Foot pain and swelling EXAM: CHEST - 2 VIEW COMPARISON:  11/03/2019 FINDINGS: Cardiac shadow is enlarged but stable. The lungs are well aerated bilaterally. No focal infiltrate or sizable effusion is seen. No acute bony abnormality is noted. IMPRESSION: No acute abnormality seen. Electronically Signed   By: Inez Catalina M.D.   On: 12/09/2019 23:32   DG Foot Complete Left  Result Date: 12/09/2019 CLINICAL DATA:  No injury, entire foot is swollen and painful, patient states it has been hurting for 2 weeks EXAM: LEFT FOOT - COMPLETE 3+ VIEW COMPARISON:  None. FINDINGS: There is no evidence of fracture or dislocation. There is no evidence of arthropathy or other focal bone abnormality. Diffuse subcutaneus soft tissue edema. IMPRESSION: No acute displaced fracture or dislocation. Electronically Signed   By: Iven Finn M.D.   On: 12/09/2019 22:05     New York Heart Association (NYHA) Functional Class NYHA Class III  Assessment and Plan:   1. Acute on chronic systolic heart failure  -Unfortunately I/O is currently not available in the ED.  Patient has been given 60 mg IV Lasix yesterday.  -Fortunately his lung is clear based on chest x-ray and physical exam.  He does have bilateral lower extremity 1+ pitting edema.  I would recommend to give another 60 mg of IV Lasix today and then switch to 40 mg daily of oral Lasix starting tomorrow.  He does  not appears to be significantly volume overloaded and I suspect he can be switched to oral diuretic after today.  2. Cellulitis: We will defer to primary team   3. Persistent atrial flutter: Recommend restart home medication including metoprolol, digoxin and amiodarone.  Heart rate was controlled last night, however home medication has been on hold, therefore heart rate is beginning to creep up this morning.  4. Nonischemic cardiomyopathy: Recent cardiac catheterization showed EF 25%.  Will need to more aggressive medication titration for heart failure purposes.  May also consider switch his losartan to [facility] (2-Rh)  5. Obstructive sleep apnea  6. History of seizure: No recent recurrence  7. Hypertension  8. Hyperlipidemia  9. Prediabetes      For questions or updates, please contact [place] Please consult [email or web address] for contact info under    Signed, Minus Breeding, MD  12/10/2019 12:06 PM  History and all data above reviewed.  Patient examined.  I agree with the findings as above.  The patient  was admitted with foot swelling and possible acute on chronic HF and cellulitis.  We are called to assist with his volume overload.  He has a non ischemic cardiomyopathy as above with fluctuating BPs most recently reduced to 25%.  He lives in an apartment with his wife.  She has dementia.  He gets around with a walker.  He has had increased left foot ankle pain and swelling with bilateral ankle edema more chronic.  He has had stable weights.  He has had increased DOE with cough.  He does not add salt but he eats most of his meals already prepared.   He denies cough or fevers or chills.  He does not notice his fib/flutter.  He tolerates blood thinner.  The patient exam reveals [id number]:[id number]   ,  Lungs: Clear  ,  Abd: Positive bowel sounds, no rebound no guarding, Ext Moderate bilateral edema  .  All available labs, radiology testing, previous records reviewed. Agree with documented assessment  and plan.   Acute on chronic systolic HF:  I agree with plans for IV diuresis today and then reassess tomorrow.  It is difficult to know what his baseline is.  He thought that he was on Lasix at home but we do not see this.  He probably needs some baseline Lasix at discharge.  I did look through his old medication list and I do not see that he has had Entresto.  I will change him to this during this admission.    Jeneen Rinks Juliann Olesky  12:06 PM  12/10/2019

## 2019-12-10 NOTE — Progress Notes (Signed)
Lower extremity venous LT study completed.   Please see CV Proc for preliminary results.   Darlin Coco, RDMS

## 2019-12-10 NOTE — ED Notes (Signed)
Pt offered meal tray and declined. Pt states he "does not want it."

## 2019-12-11 ENCOUNTER — Ambulatory Visit: Payer: Self-pay

## 2019-12-11 DIAGNOSIS — I420 Dilated cardiomyopathy: Secondary | ICD-10-CM | POA: Diagnosis not present

## 2019-12-11 DIAGNOSIS — L03116 Cellulitis of left lower limb: Secondary | ICD-10-CM | POA: Diagnosis not present

## 2019-12-11 DIAGNOSIS — J449 Chronic obstructive pulmonary disease, unspecified: Secondary | ICD-10-CM | POA: Diagnosis not present

## 2019-12-11 DIAGNOSIS — I5023 Acute on chronic systolic (congestive) heart failure: Secondary | ICD-10-CM | POA: Diagnosis not present

## 2019-12-11 DIAGNOSIS — I10 Essential (primary) hypertension: Secondary | ICD-10-CM | POA: Diagnosis not present

## 2019-12-11 DIAGNOSIS — I483 Typical atrial flutter: Secondary | ICD-10-CM

## 2019-12-11 LAB — PROTIME-INR
INR: 2.3 — ABNORMAL HIGH (ref 0.8–1.2)
Prothrombin Time: 24.5 seconds — ABNORMAL HIGH (ref 11.4–15.2)

## 2019-12-11 LAB — GLUCOSE, CAPILLARY
Glucose-Capillary: 116 mg/dL — ABNORMAL HIGH (ref 70–99)
Glucose-Capillary: 121 mg/dL — ABNORMAL HIGH (ref 70–99)
Glucose-Capillary: 123 mg/dL — ABNORMAL HIGH (ref 70–99)
Glucose-Capillary: 80 mg/dL (ref 70–99)
Glucose-Capillary: 85 mg/dL (ref 70–99)
Glucose-Capillary: 88 mg/dL (ref 70–99)

## 2019-12-11 LAB — BASIC METABOLIC PANEL
Anion gap: 12 (ref 5–15)
BUN: 16 mg/dL (ref 8–23)
CO2: 29 mmol/L (ref 22–32)
Calcium: 8.8 mg/dL — ABNORMAL LOW (ref 8.9–10.3)
Chloride: 99 mmol/L (ref 98–111)
Creatinine, Ser: 0.81 mg/dL (ref 0.61–1.24)
GFR calc non Af Amer: >60 mL/min (ref >60)
Glucose, Bld: 102 mg/dL — ABNORMAL HIGH (ref 70–99)
Potassium: 4.1 mmol/L (ref 3.5–5.1)
Sodium: 140 mmol/L (ref 135–145)

## 2019-12-11 MED ORDER — DIGOXIN 125 MCG PO TABS
0.1250 mg | ORAL_TABLET | Freq: Every day | ORAL | Status: DC
  Administered 2019-12-12: 0.125 mg via ORAL
  Filled 2019-12-11: qty 1

## 2019-12-11 MED ORDER — INSULIN ASPART 100 UNIT/ML ~~LOC~~ SOLN
0.0000 [IU] | Freq: Three times a day (TID) | SUBCUTANEOUS | Status: DC
  Administered 2019-12-11: 1 [IU] via SUBCUTANEOUS

## 2019-12-11 MED ORDER — WARFARIN SODIUM 3 MG PO TABS
3.0000 mg | ORAL_TABLET | Freq: Every day | ORAL | Status: DC
  Administered 2019-12-11: 3 mg via ORAL
  Filled 2019-12-11 (×2): qty 1

## 2019-12-11 MED ORDER — FUROSEMIDE 10 MG/ML IJ SOLN
40.0000 mg | Freq: Once | INTRAMUSCULAR | Status: AC
  Administered 2019-12-11: 40 mg via INTRAVENOUS
  Filled 2019-12-11: qty 4

## 2019-12-11 MED ORDER — FUROSEMIDE 40 MG PO TABS
40.0000 mg | ORAL_TABLET | Freq: Every day | ORAL | Status: DC
  Administered 2019-12-12: 40 mg via ORAL
  Filled 2019-12-11: qty 1

## 2019-12-11 NOTE — Evaluation (Signed)
Occupational Therapy Evaluation Patient Details Name: Ruben Murray MRN: [id number] DOB: 01-19-1946 Today's Date: 12/11/2019    History of Present Illness 74 y.o. male with medical history significant of HLD, prediabetes, HTN, BPH, paroxysmal atrial fibrillation on anticoagulation,  nonischemic cardiomyopathy, untreated sleep apnea, history of seizure, Covid positive in August 2021 status post monoclonal antibodies, COPD, morbid obesity, acquired thrombophilia, dilated cardiomyopathy.Patient presented with ED on 10/5 with complaint of 4-week history of left foot swelling. Dx of cellulitis.   Clinical Impression   Pt admitted with the above diagnoses and presents with below problem list. Pt will benefit from continued acute OT to address the below listed deficits and maximize independence with basic ADLs prior to d/c to venue below. At baseline pt is mod I with ADLs. Pt is currently min guard with LB ADLs and functional mobility.      Follow Up Recommendations  Home health OT;Supervision - Intermittent    Equipment Recommendations  None recommended by OT    Recommendations for Other Services       Precautions / Restrictions Precautions Precautions: Fall Restrictions Weight Bearing Restrictions: No      Mobility Bed Mobility               General bed mobility comments: up in recliner  Transfers Overall transfer level: Needs assistance Equipment used: Rolling walker (2 wheeled) Transfers: Sit to/from Stand Sit to Stand: Min guard         General transfer comment: min guard for mild unsteadiness, used armrests    Balance Overall balance assessment: Needs assistance   Sitting balance-Leahy Scale: Good       Standing balance-Leahy Scale: Fair                             ADL either performed or assessed with clinical judgement   ADL Overall ADL's : Needs assistance/impaired Eating/Feeding: Set up;Sitting   Grooming: Set up;Sitting   Upper  Body Bathing: Set up;Sitting   Lower Body Bathing: Min guard;Sit to/from stand   Upper Body Dressing : Set up;Sitting   Lower Body Dressing: Min guard;Sit to/from stand   Toilet Transfer: Min guard;Ambulation;RW;Comfort height toilet   Toileting- Clothing Manipulation and Hygiene: Min guard;Sit to/from stand   Tub/ Shower Transfer: Min guard;Ambulation   Functional mobility during ADLs: Min guard;Rolling walker General ADL Comments: walked household distances and completed functinoal mobility at min guard level usiing rw     Vision Baseline Vision/History: Wears glasses Wears Glasses: At all times       Perception     Praxis      Pertinent Vitals/Pain Pain Assessment: Faces Pain Score: 4  Faces Pain Scale: Hurts even more Pain Location: L ankle Pain Descriptors / Indicators: Constant;Sore;Guarding Pain Intervention(s): Monitored during session;Repositioned;Limited activity within patient's tolerance     Hand Dominance     Extremity/Trunk Assessment Upper Extremity Assessment Upper Extremity Assessment: Overall WFL for tasks assessed;Generalized weakness   Lower Extremity Assessment Lower Extremity Assessment: Defer to PT evaluation LLE Deficits / Details: L knee ext at least +3/5, lower leg/ankle/foot very tender to touch so did not assess ankle strength LLE: Unable to fully assess due to pain LLE Sensation: WNL       Communication Communication Communication: No difficulties   Cognition Arousal/Alertness: Awake/alert Behavior During Therapy: WFL for tasks assessed/performed Overall Cognitive Status: Within Functional Limits for tasks assessed  General Comments       Exercises     Shoulder Instructions      Home Living Family/patient expects to be discharged to:: Private residence Living Arrangements: Spouse/significant other Available Help at Discharge: Family Type of Home: Apartment Home Access:  Elevator     Home Layout: One level     Bathroom Shower/Tub: Occupational psychologist: Standard     Home Equipment: Environmental consultant - 2 wheels;Cane - single point;Grab bars - tub/shower;Shower seat          Prior Functioning/Environment Level of Independence: Independent with assistive device(s)        Comments: walks with RW, cares for wife with dementia, independent ADLs, lives in senior apt building        OT Problem List: Decreased activity tolerance;Impaired balance (sitting and/or standing);Decreased strength;Decreased knowledge of use of DME or AE;Decreased knowledge of precautions;Pain      OT Treatment/Interventions: Self-care/ADL training;Therapeutic exercise;Energy conservation;DME and/or AE instruction;Therapeutic activities;Patient/family education;Balance training    OT Goals(Current goals can be found in the care plan section) Acute Rehab OT Goals Patient Stated Goal: DC home to care for his wife with Alzheimers OT Goal Formulation: With patient Time For Goal Achievement: 12/25/19 Potential to Achieve Goals: Good ADL Goals Pt Will Perform Lower Body Bathing: with modified independence;sit to/from stand Pt Will Perform Lower Body Dressing: with modified independence;sit to/from stand Pt Will Transfer to Toilet: with modified independence;ambulating Pt Will Perform Toileting - Clothing Manipulation and hygiene: with modified independence;sit to/from stand Pt Will Perform Tub/Shower Transfer: with supervision;ambulating;shower seat;rolling walker  OT Frequency: Min 2X/week   Barriers to D/C:            Co-evaluation              AM-PAC OT "6 Clicks" Daily Activity     Outcome Measure Help from another person eating meals?: None Help from another person taking care of personal grooming?: None Help from another person toileting, which includes using toliet, bedpan, or urinal?: A Little Help from another person bathing (including washing, rinsing,  drying)?: A Little Help from another person to put on and taking off regular upper body clothing?: None Help from another person to put on and taking off regular lower body clothing?: A Little 6 Click Score: 21   End of Session Equipment Utilized During Treatment: Rolling walker  Activity Tolerance: Patient limited by pain;Patient limited by fatigue;Patient tolerated treatment well Patient left: in chair;with call bell/phone within reach  OT Visit Diagnosis: Unsteadiness on feet (R26.81);Muscle weakness (generalized) (M62.81);Pain Pain - Right/Left: Left Pain - part of body: Ankle and joints of foot                Time: 2202-5427 OT Time Calculation (min): 17 min Charges:  OT General Charges $OT Visit: 1 Visit OT Evaluation $OT Eval Low Complexity: [place], OT Acute Rehabilitation Services Pager: [phone] Office: [phone]   Hortencia Pilar 12/11/2019, 2:25 PM

## 2019-12-11 NOTE — Progress Notes (Signed)
PROGRESS NOTE  Ruben Murray  DOB: 1945/06/21  PCP: [name], MD CZY:[id number]  DOA: 12/09/2019  LOS: 2 days   Chief Complaint  Patient presents with  . Cellulitis   Brief narrative: Ruben Murray is a 74 y.o. male with medical history significant of HLD, prediabetes, HTN, BPH, paroxysmal atrial fibrillation on anticoagulation,  nonischemic cardiomyopathy, untreated sleep apnea, history of seizure, Covid positive in August 2021 status post monoclonal antibodies, COPD, morbid obesity, acquired thrombophilia, dilated cardiomyopathy. Patient presented with ED on 10/5 with complaint of 4-week history of left foot swelling.   Patient was admitted in August 2021 for syncope.  Echocardiogram in that admission showed EF of 25%, he underwent cardiac cath that showed normal coronary arteries.  He was discharged home on metoprolol, Eliquis, amiodarone, digoxin but not on any diuretics. For the past few weeks, patient has been having increasing left foot pain with swelling.  He was seen by PCP and suspected gout, referred to orthopedic physician who started him on antibiotics for cellulitis.  Patient reports he has not been improving with antibiotics.  MRI of left foot was obtained on 10/4.   Patient states he was called with a report and asked to go to ED for IV antibiotics.  In the ED, he was noted to have 1+ bilateral pitting pedal edema. He was started on IV Lasix as well as IV antibiotics and admitted to hospital service. Cardiology consultation was obtained  Subjective: Patient was seen and examined this morning.   Sitting up in chair.  Not in distress.  Cellulitis seems to be improving on the left foot.  Both foot remains open.  Assessment/Plan: Acute on chronic systolic heart failure Nonischemic cardiomyopathy Essential hypertension -Presented with bilateral pedal edema.  Last echo with EF 25%. Not on Lasix at home. -Cardiology consult appreciated.  Given 1 dose of IV Lasix  yesterday. -Only documented to have 500 mL output in last 24 hours.  Unclear if it is true.  Creatinine remains stable. -I will repeat another 1 dose of Lasix 40 mg today.  Persistent a flutter -Continue metoprolol, amiodarone and digoxin. -Patient was last discharged on Eliquis but was switched to Coumadin as an outpatient because of cost issues. -Subtherapeutic INR at presentation.  Coumadin has been resumed.  Left leg cellulitis -WBC elevated 12.4, lactic acid normal, not septic -Was started on IV cefepime and IV vancomycin in the ED. -Stopped IV vancomycin today.  Continue IV cefepime. -Plain x-ray obtained on admission did not show any evidence of osteomyelitis or foreign objects. -10/6, I discussed with nurse at [facility]. Charlestine Night office.  MRI left foot on 10/4 did not show any bony abnormality and confirmed cellulitis only. Recent Labs  Lab 12/09/19 1654 12/09/19 1925 12/10/19 1636  WBC 12.4*  --  10.4  LATICACIDVEN 1.4 1.8  --    Diabetes mellitus 2  -A1c 6.2 on 9//1 -Glucose controlled.  Trend as below.  Continue to monitor on sliding scale insulin. Recent Labs  Lab 12/10/19 2019 12/11/19 0009 12/11/19 0406 12/11/19 0808 12/11/19 1156  GLUCAP 126* 85 88 80 116*   History of seizure disorder - on phenobarbital  Morbid obesity - Body mass index is 39.55 kg/m. Patient has been advised to make an attempt to improve diet and exercise patterns to aid in weight loss.  Obstructive sleep apnea -recommend CPAP      Mobility: Needs PT evaluation. Code Status:   Code Status: DNR  Nutritional status: Body mass index is 39.55  kg/m.     Diet Order            Diet Carb Modified Fluid consistency: Thin; Room service appropriate? Yes  Diet effective now                 DVT prophylaxis: Coumadin   Antimicrobials:  IV cefepime Fluid: None Consultants: Cardiology, orthopedics Family Communication:  None at bedside  Status is: Inpatient  Remains inpatient  appropriate because:Ongoing diagnostic testing needed not appropriate for outpatient work up and IV treatments appropriate due to intensity of illness or inability to take PO   Dispo: The patient is from: Home              Anticipated d/c is to: Home -likely with PT              Anticipated d/c date is: 3 days              Patient currently is not medically stable to d/c.       Infusions:  . ceFEPime (MAXIPIME) IV 2 g (12/11/19 1223)    Scheduled Meds: . amiodarone  200 mg Oral Daily  . digoxin  0.25 mg Oral Daily  . docusate sodium  100 mg Oral BID  . famotidine  20 mg Oral QHS  . finasteride  5 mg Oral Q1200  . insulin aspart  0-9 Units Subcutaneous TID AC & HS  . loratadine  10 mg Oral Q1200  . metoprolol succinate  100 mg Oral Daily  . PHENobarbital  97.2 mg Oral QHS  . pravastatin  40 mg Oral QHS  . sacubitril-valsartan  1 tablet Oral BID  . sodium chloride flush  3 mL Intravenous Q12H  . warfarin  3 mg Oral q1600  . Warfarin - Pharmacist Dosing Inpatient   Does not apply q1600    Antimicrobials: Anti-infectives (From admission, onward)   Start     Dose/Rate Route Frequency Ordered Stop   12/11/19 0000  ceFEPIme (MAXIPIME) 2 g in sodium chloride 0.9 % 100 mL IVPB        2 g 200 mL/hr over 30 Minutes Intravenous Every 12 hours 12/10/19 1156     12/10/19 2200  vancomycin (VANCOREADY) IVPB 1250 mg/250 mL  Status:  Discontinued        1,250 mg 166.7 mL/hr over 90 Minutes Intravenous Every 24 hours 12/10/19 1153 12/11/19 0904   12/10/19 1200  ceFEPIme (MAXIPIME) 2 g in sodium chloride 0.9 % 100 mL IVPB        2 g 200 mL/hr over 30 Minutes Intravenous NOW 12/10/19 1150 12/10/19 1302   12/09/19 2215  vancomycin (VANCOREADY) IVPB 2000 mg/400 mL        2,000 mg 200 mL/hr over 120 Minutes Intravenous  Once 12/09/19 2204 12/10/19 0030   12/09/19 2200  piperacillin-tazobactam (ZOSYN) IVPB 3.375 g        3.375 g 100 mL/hr over 30 Minutes Intravenous  Once 12/09/19 2147  12/09/19 2259      PRN meds:    Objective: Vitals:   12/11/19 0642 12/11/19 1020  BP: 126/67 111/66  Pulse: 69 69  Resp: 16 14  Temp: (!) 97.4 F (36.3 C) 97.9 F (36.6 C)  SpO2: 93% 95%    Intake/Output Summary (Last 24 hours) at 12/11/2019 1343 Last data filed at 12/11/2019 1024 Gross per 24 hour  Intake 240 ml  Output 750 ml  Net -510 ml   Filed Weights   12/11/19 0450 12/11/19 6270  Weight: 117.7 kg 118 kg   Weight change:  Body mass index is 39.55 kg/m.   Physical Exam: General exam: Appears calm and comfortable.  Not in physical distress Skin: No rashes, lesions or ulcers. HEENT: Atraumatic, normocephalic, supple neck, no obvious bleeding Lungs: Clear to auscultate bilaterally CVS: Regular rhythm, controlled rate, no murmur GI/Abd soft, nontender, nondistended, bowel sound present CNS: Alert, awake, oriented x3 Psychiatry: Mood appropriate Extremities: 1+ pedal edema bilaterally, left leg with cellulitis, improving.  Data Review: I have personally reviewed the laboratory data and studies available.  Recent Labs  Lab 12/09/19 1654 12/10/19 1636  WBC 12.4* 10.4  NEUTROABS 9.8* 7.0  HGB 14.1 14.1  HCT 41.9 42.7  MCV 93.3 94.3  PLT 240 252   Recent Labs  Lab 12/09/19 1654 12/10/19 1636 12/11/19 0918  NA 138 138 140  K 4.2 4.6 4.1  CL 102 98 99  CO2 _0 GLUCOSE 125* 109* 102*  BUN _1 CREATININE 1.10 0.95 0.81  CALCIUM 8.6* 8.3* 8.8*  MG 1.8 1.9  --   PHOS  --  3.2  --     F/u labs ordered  Signed, Terrilee Croak, MD [facility] 12/11/2019

## 2019-12-11 NOTE — Consult Note (Signed)
   [facility] Inpatient Consult   12/11/2019  Ruben Murray [birth year] [id number]    Patient is currently active with [place] [facility] for chronic disease and case management services.  Patient has been engaged by a [place] RN.   Will continue to follow for progression and disposition plans updating community care manager of patient progress.  Of note, [facility] Management services does not replace or interfere with any services that are arranged by inpatient case management or social work.  Netta Cedars, MSN, [facility] Liaison Nurse Mobile Phone [phone]  Toll free office [phone]

## 2019-12-11 NOTE — TOC Initial Note (Signed)
Transition of Care [facility]) - Initial/Assessment Note    Patient Details  Name: Ruben Murray MRN: [id number] Date of Birth: [birth year]  Transition of Care [facility]) CM/SW Contact:    Lennart Pall, LCSW Phone Number: 12/11/2019, 2:07 PM  Clinical Narrative:                 Met with pt to review potential dc needs. Pt confirms that he and his wife (dementia - neighbor is checking in on her regularly) are living at [employer] which is an elderly apartment complex in [place].  Good support from neighbors around them and pt was completely independent PTA.  Goal is to return home and is agreeable to [facility] services.  Has had [facility] in the past and would like to use again. Will make referrals.  Pt anticipates dc in 1-2 days per his discussion with MD.    Expected Discharge Plan: [place] Barriers to Discharge: Continued Medical Work up   Patient Goals and CMS Choice Patient states their goals for this hospitalization and ongoing recovery are:: return home with wife CMS Medicare.gov Compare Post Acute Care list provided to:: Patient Choice offered to / list presented to : Patient  Expected Discharge Plan and Services Expected Discharge Plan: [place] In-house Referral: Clinical Social Work   Post Acute Care Choice: [place] arrangements for the past 2 months: Apartment                                      Prior Living Arrangements/Services Living arrangements for the past 2 months: Apartment Lives with:: Spouse Patient language and need for interpreter reviewed:: Yes Do you feel safe going back to the place where you live?: Yes      Need for Family Participation in Patient Care: No (Comment) Care giver support system in place?: Yes (comment)   Criminal Activity/Legal Involvement Pertinent to Current Situation/Hospitalization: No - Comment as needed  Activities of Daily Living Home Assistive Devices/Equipment: Walker  (specify type) ADL Screening (condition at time of admission) Patient's cognitive ability adequate to safely complete daily activities?: Yes Is the patient deaf or have difficulty hearing?: No Does the patient have difficulty seeing, even when wearing glasses/contacts?: No Does the patient have difficulty concentrating, remembering, or making decisions?: No Patient able to express need for assistance with ADLs?: Yes Does the patient have difficulty dressing or bathing?: No Independently performs ADLs?: Yes (appropriate for developmental age) Does the patient have difficulty walking or climbing stairs?: Yes Weakness of Legs: Left Weakness of Arms/Hands: None  Permission Sought/Granted                  Emotional Assessment Appearance:: Appears stated age Attitude/Demeanor/Rapport: Engaged, Gracious Affect (typically observed): Pleasant, Accepting Orientation: : Oriented to Self, Oriented to Place, Oriented to  Time, Oriented to Situation Alcohol / Substance Use: Not Applicable Psych Involvement: No (comment)  Admission diagnosis:  Cellulitis [L03.90] Dyspnea [R06.00] Cellulitis of left foot [L03.116] Non-intractable vomiting with nausea, unspecified vomiting type [R11.2] Patient Active Problem List   Diagnosis Date Noted  . Cellulitis 12/09/2019  . Chronic systolic CHF (congestive heart failure) ([place]) 12/09/2019  . Acute on chronic systolic CHF (congestive heart failure) ([place]) 12/09/2019  . Dilated cardiomyopathy ([place])   . Atrial flutter ([place]) 11/03/2019  . Atrial flutter with rapid ventricular response ([place]) 11/03/2019  . Acquired thrombophilia (  [place]) 01/15/2019  . Persistent atrial fibrillation ([place])   . Acute on chronic combined systolic and diastolic CHF (congestive heart failure) ([place]) 10/29/2017  . CHF exacerbation ([place]) 10/29/2017  . Prolonged QT interval 10/29/2017  . Sensorineural hearing loss (SNHL) of both ears 01/31/2017  . Dysphonia 10/03/2016  .  Laryngopharyngeal reflux (LPR) 10/03/2016  . Nasal polyps 10/03/2016  . Rhinitis medicamentosa 10/03/2016  . Chronic laryngitis 10/03/2016  . Hoarseness 08/31/2016  . Moderate persistent asthma 08/02/2016  . COPD GOLD II  08/02/2016  . Morbid (severe) obesity due to excess calories ([place]) 08/02/2016  . CAP (community acquired pneumonia) 03/07/2016  . Community acquired pneumonia 03/06/2016  . Essential hypertension 03/06/2016  . Hyperlipidemia 03/06/2016  . Right thyroid nodule 03/06/2016  . Seizures ([place]) 03/06/2016  . Fever 03/12/2015  . Recurrent erosion of cornea 06/23/2011   PCP:  [name], MD Pharmacy:   [place], [place] - [address] [place] [address] Phone: [phone] Fax: [facility] [place], [address] [place] [address] Phone: [phone] Fax: [phone]     Social Determinants of Health (SDOH) Interventions    Readmission Risk Interventions Readmission Risk Prevention Plan 12/11/2019  Transportation Screening Complete  PCP or Specialist Appt within 5-7 Days Complete  Home Care Screening Complete  Medication Review (RN CM) Complete  Some recent data might be hidden

## 2019-12-11 NOTE — Progress Notes (Signed)
ANTICOAGULATION CONSULT NOTE   Pharmacy Consult for warfarin Indication: atrial fibrillation  Allergies  Allergen Reactions   Dilaudid [Hydromorphone Hcl] Other (See Comments)    Makes pt hyper    Dilaudid [Hydromorphone] Other (See Comments)    hyperactiviity   Tegretol [Carbamazepine] Hives   Tegretol [Carbamazepine] Hives    Patient Measurements: Height: _0  (172.7 cm) Weight: 118 kg (260 lb 2.3 oz) IBW/kg (Calculated) : 68.4   Vital Signs: Temp: 97.4 F (36.3 C) (10/07 0642) Temp Source: Oral (10/07 0642) BP: 126/67 (10/07 8891) Pulse Rate: 69 (10/07 0642)  Labs: Recent Labs    12/09/19 1654 12/10/19 0002 12/10/19 0350 12/10/19 1636 12/11/19 0458  HGB 14.1  --   --  14.1  --   HCT 41.9  --   --  42.7  --   PLT 240  --   --  252  --   LABPROT  --  18.0* 18.9*  --  24.5*  INR  --  1.5* 1.6*  --  2.3*  CREATININE 1.10  --   --  0.95  --   TROPONINIHS  --  9  --   --   --     Estimated Creatinine Clearance: 82.5 mL/min (by C-G formula based on SCr of 0.95 mg/dL).   Assessment:  74 y.o. male with medical history significant of HLD, prediabetes, HTN, BPH, paroxysmal atrial fibrillation on anticoagulation, Covid positive in August 2021 status post monoclonal antibodies, COPD, morbid severe obesity, acquired thrombophilia, dilated cardiomyopathy.  Pt discharged home on elquis but could not afford and switched back to warfarin.  Home dose 13m daily X Suday.  Last dose 10/4.   12/11/2019 INR 2.3> therapeutic No bleeding reported  Goal of Therapy:  INR 2-3    Plan:  Warfarin 3 mg daily Daily INR  MEudelia Bunch Pharm.D 12/11/2019 8:20 AM

## 2019-12-11 NOTE — Progress Notes (Addendum)
Progress Note  Patient Name: Ruben Murray Date of Encounter: 12/11/2019  Primary Cardiologist:  [name], MD  Subjective   Breathing better, but has not been up much, not sure if back to baseline. RLE always swell, LLE is swollen due to cellulitis.  Inpatient Medications    Scheduled Meds: . amiodarone  200 mg Oral Daily  . digoxin  0.25 mg Oral Daily  . docusate sodium  100 mg Oral BID  . famotidine  20 mg Oral QHS  . finasteride  5 mg Oral Q1200  . furosemide  40 mg Intravenous Once  . insulin aspart  0-9 Units Subcutaneous TID AC & HS  . loratadine  10 mg Oral Q1200  . metoprolol succinate  100 mg Oral Daily  . PHENobarbital  97.2 mg Oral QHS  . pravastatin  40 mg Oral QHS  . sacubitril-valsartan  1 tablet Oral BID  . sodium chloride flush  3 mL Intravenous Q12H  . warfarin  3 mg Oral q1600  . Warfarin - Pharmacist Dosing Inpatient   Does not apply q1600   Continuous Infusions: . ceFEPime (MAXIPIME) IV 2 g (12/11/19 1223)   PRN Meds: acetaminophen **OR** acetaminophen, albuterol, HYDROcodone-acetaminophen   Vital Signs    Vitals:   12/11/19 0644 12/11/19 0700 12/11/19 1020 12/11/19 1353  BP:   111/66 109/61  Pulse:   69 64  Resp:   14 17  Temp:   97.9 F (36.6 C) 98.1 F (36.7 C)  TempSrc:   Oral Oral  SpO2:   95% 92%  Weight: 118 kg     Height:  _0  (1.727 m)      Intake/Output Summary (Last 24 hours) at 12/11/2019 1409 Last data filed at 12/11/2019 1024 Gross per 24 hour  Intake 240 ml  Output 750 ml  Net -510 ml   Filed Weights   12/11/19 0450 12/11/19 0644  Weight: 117.7 kg 118 kg   Last Weight  Most recent update: 12/11/2019  6:44 AM   Weight  118 kg (260 lb 2.3 oz)           Weight change:    Telemetry    Atrial flutter w/ PVCs, bigeminy, rate generally ok - Personally Reviewed  ECG    None today - Personally Reviewed  Physical Exam   General: Well developed, well nourished, male appearing in no acute distress. Head:  Normocephalic, atraumatic.  Neck: Supple without bruits, JVD 10 cm. Lungs:  Resp regular and unlabored, scattered rales. Heart: Irreg R&R, S1, S2, no S3, S4, or murmur; no rub. Abdomen: Soft, non-tender, non-distended with normoactive bowel sounds. No hepatomegaly. No rebound/guarding. No obvious abdominal masses. Extremities: No clubbing, cyanosis, + pedal edema. radial pulses are 2+ bilaterally. Erythema noted to L foot Neuro: Alert and oriented X 3. Moves all extremities spontaneously. Psych: Normal affect.  Labs    Hematology Recent Labs  Lab 12/09/19 1654 12/10/19 1636  WBC 12.4* 10.4  RBC 4.49 4.53  HGB 14.1 14.1  HCT 41.9 42.7  MCV 93.3 94.3  MCH 31.4 31.1  MCHC 33.7 33.0  RDW 13.5 13.6  PLT 240 252    Chemistry Recent Labs  Lab 12/09/19 1654 12/10/19 1636 12/11/19 0918  NA 138 138 140  K 4.2 4.6 4.1  CL 102 98 99  CO2 _1 GLUCOSE 125* 109* 102*  BUN _2 CREATININE 1.10 0.95 0.81  CALCIUM 8.6* 8.3* 8.8*  PROT 7.3 7.0  --  ALBUMIN 3.9 3.3*  --   AST 24 27  --   ALT 21 21  --   ALKPHOS 128* 120  --   BILITOT 0.7 0.9  --   GFRNONAA >60 >60 >60  ANIONGAP _0 High Sensitivity Troponin:   Recent Labs  Lab 12/10/19 0002  TROPONINIHS 9      BNP Recent Labs  Lab 12/09/19 1654  BNP 80.6     DDimer No results for input(s): DDIMER in the last 168 hours.   Radiology    DG Chest 2 View  Result Date: 12/09/2019 CLINICAL DATA:  Foot pain and swelling EXAM: CHEST - 2 VIEW COMPARISON:  11/03/2019 FINDINGS: Cardiac shadow is enlarged but stable. The lungs are well aerated bilaterally. No focal infiltrate or sizable effusion is seen. No acute bony abnormality is noted. IMPRESSION: No acute abnormality seen. Electronically Signed   By: Inez Catalina M.D.   On: 12/09/2019 23:32   DG Foot Complete Left  Result Date: 12/09/2019 CLINICAL DATA:  No injury, entire foot is swollen and painful, patient states it has been hurting for 2 weeks  EXAM: LEFT FOOT - COMPLETE 3+ VIEW COMPARISON:  None. FINDINGS: There is no evidence of fracture or dislocation. There is no evidence of arthropathy or other focal bone abnormality. Diffuse subcutaneus soft tissue edema. IMPRESSION: No acute displaced fracture or dislocation. Electronically Signed   By: Iven Finn M.D.   On: 12/09/2019 22:05   VAS [place] LOWER EXTREMITY VENOUS (DVT)  Result Date: 12/10/2019  Lower Venous DVTStudy Indications: Swelling.  Limitations: Poor ultrasound/tissue interface. Comparison Study: 10-30-2017 Prior RT lower venous study. Performing Technologist: Darlin Coco  Examination Guidelines: A complete evaluation includes B-mode imaging, spectral Doppler, color Doppler, and power Doppler as needed of all accessible portions of each vessel. Bilateral testing is considered an integral part of a complete examination. Limited examinations for reoccurring indications may be performed as noted. The reflux portion of the exam is performed with the patient in reverse Trendelenburg.  +-----+---------------+---------+-----------+----------+--------------+ RIGHTCompressibilityPhasicitySpontaneityPropertiesThrombus Aging +-----+---------------+---------+-----------+----------+--------------+ CFV  Full           Yes      Yes                                 +-----+---------------+---------+-----------+----------+--------------+   +---------+---------------+---------+-----------+----------+---------------+ LEFT     CompressibilityPhasicitySpontaneityPropertiesThrombus Aging  +---------+---------------+---------+-----------+----------+---------------+ CFV      Full           Yes      Yes                                  +---------+---------------+---------+-----------+----------+---------------+ SFJ      Full                                                         +---------+---------------+---------+-----------+----------+---------------+ FV Prox  Full                                                          +---------+---------------+---------+-----------+----------+---------------+  FV Mid   Full                                                         +---------+---------------+---------+-----------+----------+---------------+ FV DistalFull                                                         +---------+---------------+---------+-----------+----------+---------------+ PFV      Full                                                         +---------+---------------+---------+-----------+----------+---------------+ POP      Full           Yes      Yes                                  +---------+---------------+---------+-----------+----------+---------------+ PTV      Full                                                         +---------+---------------+---------+-----------+----------+---------------+ PERO                                                  Patent by color +---------+---------------+---------+-----------+----------+---------------+     Summary: RIGHT: - No evidence of common femoral vein obstruction.  LEFT: - There is no evidence of deep vein thrombosis in the lower extremity. However, portions of this examination were limited- see technologist comments above.  - No cystic structure found in the popliteal fossa.  *See table(s) above for measurements and observations. Electronically signed by Harold Barban MD on 12/10/2019 at 9:03:04 PM.    Final      Cardiac Studies   ECHO: 11/04/2019 1. Left ventricular ejection fraction, by estimation, is 20 to 25%. The  left ventricle has severely decreased function. The left ventricle  demonstrates global hypokinesis. The left ventricular internal cavity size  was moderately dilated. Left  ventricular diastolic parameters are indeterminate.  2. Right ventricular systolic function is moderately reduced. The right  ventricular size is moderately enlarged. There  is mildly elevated  pulmonary artery systolic pressure. The estimated right ventricular  systolic pressure is 76.1 mmHg.  3. Left atrial size was mildly dilated.  4. Right atrial size was moderately dilated.  5. The mitral valve is normal in structure. Mild mitral valve  regurgitation. No evidence of mitral stenosis.  6. The aortic valve is tricuspid. Aortic valve regurgitation is mild. No  aortic stenosis is present.  7. The inferior vena cava is dilated in size with <50% respiratory  variability, suggesting right atrial pressure of 15 mmHg.  8. LV E/e' medial: 9.2   VENOUS DOPPLERS: 12/10/2019   Summary:  RIGHT:  - No evidence of common femoral vein obstruction.    LEFT:  - There is no evidence of deep vein thrombosis in the lower extremity.  However, portions of this examination were limited- see technologist  comments above.    - No cystic structure found in the popliteal fossa.   Patient Profile     74 y.o. male w/ hx persistent Aflutter on Eliquis & BB, NICM, OSA not on CPAP, HTN, pre-DM, HLD, COPD, COVID 10/2019, S-D-CHF, BPH, acquired thrombophilia.  He was admitted 10/05 with L foot cellulitis. Cards saw for the atrial flutter and CHF exacerbation.  Assessment & Plan    1. Acute on Chronic systolic CHF - he is near his base weight - +pedal edema, but does not go higher - SOB not well assessed, he has not been out of bed - Neck veins are likely up chronically, ok to change to po Lasix  Otherwise, per IM Active Problems:   Essential hypertension   Hyperlipidemia   Seizures (HCC)   COPD GOLD II    Morbid (severe) obesity due to excess calories (HCC)   Persistent atrial fibrillation (HCC)   Acquired thrombophilia (HCC)   Dilated cardiomyopathy (HCC)   Cellulitis   Chronic systolic CHF (congestive heart failure) (HCC)   Acute on chronic systolic CHF (congestive heart failure) ([place])    Signed, Rosaria Ferries , PA-C 2:09 PM 12/11/2019 Pager:  [phone]  I have seen and examined the patient along with Rosaria Ferries , PA-C.  I have reviewed the chart, notes and new data.  I agree with PA/NP's note.  Key new complaints: breathing better, off O2, not quite at baseline yet. Edema improved. Foot still tender. Key examination changes: fair rate control, AFlutter with ventricular rate in 80s. His weight is lower than his typical weight this year about 270 lb). Key new findings / data: dig level was 1.4, upper normal (and expect elevated free dig percentage due to amiodarone concomitant therapy).  PLAN: Reduce dig to 0.125 mg daily. Oral diuretics. It is likely that he will always have mild ankle edema and JVD from R heart failure related to cor pulmonale (COPD, untreated OSA), even when optimally diuresed from a left heart point of view.  Sanda Klein, MD, [place] [phone] 12/11/2019, 3:32 PM

## 2019-12-11 NOTE — Evaluation (Signed)
Physical Therapy Evaluation Patient Details Name: Ruben Murray MRN: [id number] DOB: 1945/07/18 Today's Date: 12/11/2019   History of Present Illness  74 y.o. male with medical history significant of HLD, prediabetes, HTN, BPH, paroxysmal atrial fibrillation on anticoagulation,  nonischemic cardiomyopathy, untreated sleep apnea, history of seizure, Covid positive in August 2021 status post monoclonal antibodies, COPD, morbid obesity, acquired thrombophilia, dilated cardiomyopathy.Patient presented with ED on 10/5 with complaint of 4-week history of left foot swelling. Dx of cellulitis.  Clinical Impression  Pt admitted with above diagnosis. Pt ambulated 150' with RW, no loss of balance, verbal cues for positioning in RW and for posture.  Pt currently with functional limitations due to the deficits listed below (see PT Problem List). Pt will benefit from skilled PT to increase their independence and safety with mobility to allow discharge to the venue listed below.       Follow Up Recommendations Home health PT    Equipment Recommendations  None recommended by PT    Recommendations for Other Services       Precautions / Restrictions Precautions Precautions: Fall Restrictions Weight Bearing Restrictions: No      Mobility  Bed Mobility               General bed mobility comments: up in recliner  Transfers Overall transfer level: Needs assistance Equipment used: Rolling walker (2 wheeled) Transfers: Sit to/from Stand Sit to Stand: Min guard         General transfer comment: min guard for mild unsteadiness, used armrests  Ambulation/Gait Ambulation/Gait assistance: Min guard Gait Distance (Feet): 150 Feet Assistive device: Rolling walker (2 wheeled) Gait Pattern/deviations: Step-through pattern;Decreased step length - right;Decreased step length - left;Trunk flexed Gait velocity: WFL   General Gait Details: VCs for positioning in RW and posture, no loss of  balance  Stairs            Wheelchair Mobility    Modified Rankin (Stroke Patients Only)       Balance Overall balance assessment: Needs assistance   Sitting balance-Leahy Scale: Good       Standing balance-Leahy Scale: Fair                               Pertinent Vitals/Pain Pain Assessment: 0-10 Pain Score: 4  Pain Location: L ankle Pain Descriptors / Indicators: Sore Pain Intervention(s): Limited activity within patient's tolerance;Monitored during session    Home Living Family/patient expects to be discharged to:: Private residence Living Arrangements: Spouse/significant other Available Help at Discharge: Family Type of Home: Apartment Home Access: Elevator     Home Layout: One level Home Equipment: Environmental consultant - 2 wheels;Cane - single point;Grab bars - tub/shower;Shower seat      Prior Function Level of Independence: Independent with assistive device(s)         Comments: walks with RW, cares for wife with dementia, independent ADLs, lives in senior apt building     Hand Dominance        Extremity/Trunk Assessment   Upper Extremity Assessment Upper Extremity Assessment: Overall WFL for tasks assessed    Lower Extremity Assessment Lower Extremity Assessment: LLE deficits/detail LLE Deficits / Details: L knee ext at least +3/5, lower leg/ankle/foot very tender to touch so did not assess ankle strength LLE: Unable to fully assess due to pain LLE Sensation: WNL       Communication   Communication: No difficulties  Cognition Arousal/Alertness: Awake/alert Behavior During Therapy:  WFL for tasks assessed/performed Overall Cognitive Status: Within Functional Limits for tasks assessed                                        General Comments      Exercises     Assessment/Plan    PT Assessment Patient needs continued PT services  PT Problem List Decreased activity tolerance;Decreased balance;Decreased knowledge  of use of DME;Decreased mobility;Decreased safety awareness       PT Treatment Interventions Gait training;Therapeutic activities;Therapeutic exercise;Balance training    PT Goals (Current goals can be found in the Care Plan section)  Acute Rehab PT Goals Patient Stated Goal: DC home to care for his wife with Alzheimers PT Goal Formulation: With patient Time For Goal Achievement: 12/25/19 Potential to Achieve Goals: Good    Frequency Min 3X/week   Barriers to discharge        Co-evaluation               AM-PAC PT "6 Clicks" Mobility  Outcome Measure Help needed turning from your back to your side while in a flat bed without using bedrails?: None Help needed moving from lying on your back to sitting on the side of a flat bed without using bedrails?: A Little Help needed moving to and from a bed to a chair (including a wheelchair)?: None Help needed standing up from a chair using your arms (e.g., wheelchair or bedside chair)?: None Help needed to walk in hospital room?: None Help needed climbing 3-5 steps with a railing? : A Little 6 Click Score: 22    End of Session Equipment Utilized During Treatment: Gait belt Activity Tolerance: Patient tolerated treatment well;No increased pain Patient left: in chair;with call bell/phone within reach;with chair alarm set Nurse Communication: Mobility status PT Visit Diagnosis: Difficulty in walking, not elsewhere classified (R26.2);Pain Pain - Right/Left: Left Pain - part of body: Ankle and joints of foot    Time: 9826-4158 PT Time Calculation (min) (ACUTE ONLY): 17 min   Charges:   PT Evaluation $PT Eval Low Complexity: 1 Low     Blondell Reveal Kistler PT 12/11/2019  Acute Rehabilitation Services Pager [phone] Office [phone]

## 2019-12-12 DIAGNOSIS — B351 Tinea unguium: Secondary | ICD-10-CM | POA: Diagnosis present

## 2019-12-12 DIAGNOSIS — I420 Dilated cardiomyopathy: Secondary | ICD-10-CM | POA: Diagnosis not present

## 2019-12-12 DIAGNOSIS — L03116 Cellulitis of left lower limb: Secondary | ICD-10-CM | POA: Diagnosis not present

## 2019-12-12 DIAGNOSIS — I5023 Acute on chronic systolic (congestive) heart failure: Secondary | ICD-10-CM | POA: Diagnosis not present

## 2019-12-12 LAB — GLUCOSE, CAPILLARY
Glucose-Capillary: 87 mg/dL (ref 70–99)
Glucose-Capillary: 126 mg/dL — ABNORMAL HIGH (ref 70–99)

## 2019-12-12 LAB — BASIC METABOLIC PANEL
Anion gap: 10 (ref 5–15)
BUN: 20 mg/dL (ref 8–23)
CO2: 27 mmol/L (ref 22–32)
Calcium: 8.6 mg/dL — ABNORMAL LOW (ref 8.9–10.3)
Chloride: 101 mmol/L (ref 98–111)
Creatinine, Ser: 0.83 mg/dL (ref 0.61–1.24)
GFR calc non Af Amer: >60 mL/min (ref >60)
Glucose, Bld: 97 mg/dL (ref 70–99)
Potassium: 4 mmol/L (ref 3.5–5.1)
Sodium: 138 mmol/L (ref 135–145)

## 2019-12-12 LAB — PROTIME-INR
INR: 2.1 — ABNORMAL HIGH (ref 0.8–1.2)
Prothrombin Time: 22.4 seconds — ABNORMAL HIGH (ref 11.4–15.2)

## 2019-12-12 LAB — CBC WITH DIFFERENTIAL/PLATELET
Abs Immature Granulocytes: 0.04 10*3/uL (ref 0.00–0.07)
Basophils Absolute: 0.1 10*3/uL (ref 0.0–0.1)
Basophils Relative: 0 %
Eosinophils Absolute: 0.2 10*3/uL (ref 0.0–0.5)
Eosinophils Relative: 2 %
HCT: 45.1 % (ref 39.0–52.0)
Hemoglobin: 14.5 g/dL (ref 13.0–17.0)
Immature Granulocytes: 0 %
Lymphocytes Relative: 25 %
Lymphs Abs: 2.9 10*3/uL (ref 0.7–4.0)
MCH: 31 pg (ref 26.0–34.0)
MCHC: 32.2 g/dL (ref 30.0–36.0)
MCV: 96.6 fL (ref 80.0–100.0)
Monocytes Absolute: 0.8 10*3/uL (ref 0.1–1.0)
Monocytes Relative: 7 %
Neutro Abs: 7.7 10*3/uL (ref 1.7–7.7)
Neutrophils Relative %: 66 %
Platelets: 227 10*3/uL (ref 150–400)
RBC: 4.67 MIL/uL (ref 4.22–5.81)
RDW: 13.6 % (ref 11.5–15.5)
WBC: 11.8 10*3/uL — ABNORMAL HIGH (ref 4.0–10.5)
nRBC: 0 % (ref 0.0–0.2)

## 2019-12-12 MED ORDER — METOPROLOL SUCCINATE ER 100 MG PO TB24
100.0000 mg | ORAL_TABLET | Freq: Every day | ORAL | 0 refills | Status: DC
Start: 2019-12-13 — End: 2021-05-24

## 2019-12-12 MED ORDER — SODIUM CHLORIDE 0.9 % IV SOLN
2.0000 g | Freq: Three times a day (TID) | INTRAVENOUS | Status: DC
  Administered 2019-12-12: 2 g via INTRAVENOUS
  Filled 2019-12-12 (×2): qty 2

## 2019-12-12 MED ORDER — CEFDINIR 300 MG PO CAPS: 300.0000 mg | ORAL_CAPSULE | Freq: Two times a day (BID) | ORAL | 0 refills | Status: AC

## 2019-12-12 MED ORDER — DIGOXIN 125 MCG PO TABS: 0.1250 mg | ORAL_TABLET | Freq: Every day | ORAL | 0 refills | Status: DC

## 2019-12-12 MED ORDER — SACCHAROMYCES BOULARDII 250 MG PO CAPS: 250.0000 mg | ORAL_CAPSULE | Freq: Two times a day (BID) | ORAL | 0 refills | Status: AC

## 2019-12-12 MED ORDER — SACUBITRIL-VALSARTAN 24-26 MG PO TABS: 1.0000 | ORAL_TABLET | Freq: Two times a day (BID) | ORAL | 2 refills | Status: AC

## 2019-12-12 MED ORDER — FUROSEMIDE 40 MG PO TABS: 40.0000 mg | ORAL_TABLET | Freq: Every day | ORAL | 0 refills | Status: DC

## 2019-12-12 NOTE — Progress Notes (Signed)
F/u with Cecilie Kicks 10/20 _0 :15 pm at the [facility] office.  Rosaria Ferries, PA-C 12/12/2019 2:10 PM

## 2019-12-12 NOTE — Discharge Summary (Signed)
Physician Discharge Summary  Ruben Murray MOQ:[id number] DOB: 1945-06-17 DOA: 12/09/2019  PCP: [name], MD  Admit date: 12/09/2019 Discharge date: 12/12/2019  Admitted From: Home Discharge disposition: Home with home health PT   Code Status: DNR  Diet Recommendation: Cardiac/diabetic diet  Discharge Diagnosis:   Principal Problem:   Left leg cellulitis Active Problems:   Acute on chronic systolic CHF (congestive heart failure) ([place])   Essential hypertension   Hyperlipidemia   Seizures (HCC)   COPD GOLD II    Morbid (severe) obesity due to excess calories (HCC)   Persistent atrial fibrillation (HCC)   Acquired thrombophilia ([place])   Dilated cardiomyopathy ([place])   Cellulitis   Chronic systolic CHF (congestive heart failure) ([place])   Onychomycosis   History of Present Illness / Brief narrative:  Ruben Murray a 74 y.o.malewith medical history significant of HLD, prediabetes, HTN, BPH, paroxysmal atrial fibrillation on anticoagulation, nonischemic cardiomyopathy, untreated sleep apnea, history of seizure, Covid positive in August 2021 status post monoclonal antibodies, COPD, morbid obesity, acquired thrombophilia, dilated cardiomyopathy. Patient presented with ED on 10/5 with complaint of4-week history of left foot swelling.   Patient was admitted in August 2021 for syncope.  Echocardiogram in that admission showed EF of 25%, he underwent cardiac cath that showed normal coronary arteries.  He was discharged home on metoprolol, Eliquis, amiodarone, digoxin but not on any diuretics. For the past few weeks, patient has been having increasing left foot pain with swelling.  He was seen by PCP and suspected gout, referred to orthopedic physician who started him on antibiotics for cellulitis.  Patient reports he has not been improving with antibiotics.  MRI of left foot was obtained on 10/4.   Patient states he was called with a report and asked to go to ED for IV  antibiotics.  In the ED, he was noted to have 1+ bilateral pitting pedal edema. He was started on IV Lasix as well as IV antibiotics and admitted to hospital service. Cardiology consultation was obtained  Subjective:  Seen and examined this morning. Pleasant elderly Caucasian male. Sitting up in chair. Not in distress. Pedal edema improving, cellulitis improving. Feels ready to go home.  Hospital Course:  Acute on chronic systolic heart failure Nonischemic cardiomyopathy Essential hypertension -Presented with bilateral pedal edema.  Last echo with EF 25%. Not on Lasix at home. -Cardiology consult appreciated.   Given 2 doses of IV Lasix in the hospital. -Started on oral Lasix this morning. Discharge on the same. -Patient has also been started on Entresto. Losartan has been stopped.  Persistent a flutter -Discharged on adjusted doses of medicines. Metoprolol 100 mg daily, amiodarone 200 mg daily and digoxin 125 mcg daily. -Continue Coumadin for anticoagulation  Left leg cellulitis -On admission, WBC was elevated 12.4, lactic acid normal, not septic -Was started  on broad-spectrum antibiotics. -Plain x-ray obtained on admission did not show any evidence of osteomyelitis or foreign objects. -10/6, I discussed with nurse at [facility] Dr. Charlestine Night office.  MRI left foot that was obtained as an outpatient on 10/4 did not show any bony abnormality and confirmed cellulitis only. -Patient is clinically improving on IV cefepime. Will discharge him home today on oral Omnicef for next 7 days with probiotics. Recent Labs  Lab 12/09/19 1654 12/09/19 1925 12/10/19 1636 12/12/19 0529  WBC 12.4*  --  10.4 11.8*  LATICACIDVEN 1.4 1.8  --   --    Onychomycosis -Patient seems to have fungal infection which is likely the  portal of entry for cellulitis. He needs to follow-up with podiatry as an outpatient. Referral given.  Diabetes mellitus 2  -A1c 6.2 on 9/1 -Not on medicines for  diabetes. -Continue diet control.  History of seizure disorder - on phenobarbital nightly.  Obstructive sleep apnea - recommend CPAP   Stable for discharge to home today with home health PT, RN  Wound care:    Discharge Exam:   Vitals:   12/11/19 2027 12/12/19 0554 12/12/19 0947 12/12/19 0948  BP: (!) 156/73 115/64  132/72  Pulse: 66 77 68 68  Resp: 18 18    Temp: (!) 97.5 F (36.4 C) 98 F (36.7 C)    TempSrc: Oral     SpO2: 100% 96%    Weight:      Height:        Body mass index is 39.55 kg/m.  General exam: Appears calm and comfortable. Not in physical distress Skin: No rashes, lesions or ulcers. HEENT: Atraumatic, normocephalic, supple neck, no obvious bleeding Lungs: Clear to auscultation bilaterally CVS: Regular rate and rhythm, no murmur GI/Abd soft, nontender, nondistended, bowel sound present CNS: Alert, awake, oriented x3 Psychiatry: Mood appropriate Extremities: Pedal edema improving bilaterally. Left lower leg and foot cellulitis improving. Onychomycosis of multiple toenails  Follow ups:   Discharge Instructions    Ambulatory referral to Podiatry   Complete by: As directed    onychomycosis   Diet - low sodium heart healthy   Complete by: As directed    Increase activity slowly   Complete by: As directed       Follow-up Information    [name], MD Follow up.   Specialty: Family Medicine Contact information: [place] [address] [phone]        [name], MD .   Specialty: Cardiology Contact information: [id number] [place] [address] [phone]        [name], MD .   Specialty: Cardiology Contact information: [address] [place] [address] [phone]        Evelina Bucy, DPM Follow up.   Specialty: Podiatry Contact information: [address] [place] [address] [phone]               Recommendations for  Outpatient Follow-Up:   1. Follow-up with PCP as an outpatient 2. Follow-up with cardiology as an outpatient 3. Podiatry as an outpatient  Discharge Instructions:  Follow with Primary MD [name], MD in 7 days   Get CBC/BMP checked in next visit within 1 week by PCP or SNF MD ( we routinely change or add medications that can affect your baseline labs and fluid status, therefore we recommend that you get the mentioned basic workup next visit with your PCP, your PCP may decide not to get them or add new tests based on their clinical decision)  On your next visit with your PCP, please Get Medicines reviewed and adjusted.  Please request your PCP  to go over all Hospital Tests and Procedure/Radiological results at the follow up, please get all Hospital records sent to your Prim MD by signing hospital release before you go home.  Activity: As tolerated with Full fall precautions use walker/cane & assistance as needed  For Heart failure patients - Check your Weight same time everyday, if you gain over 2 pounds, or you develop in leg swelling, experience more shortness of breath or chest pain, call your Primary MD  immediately. Follow Cardiac Low Salt Diet and 1.5 lit/day fluid restriction.  If you have smoked or chewed Tobacco in the last 2 yrs please stop smoking, stop any regular Alcohol  and or any Recreational drug use.  If you experience worsening of your admission symptoms, develop shortness of breath, life threatening emergency, suicidal or homicidal thoughts you must seek medical attention immediately by calling 911 or calling your MD immediately  if symptoms less severe.  You Must read complete instructions/literature along with all the possible adverse reactions/side effects for all the Medicines you take and that have been prescribed to you. Take any new Medicines after you have completely understood and accpet all the possible adverse reactions/side effects.   Do not drive,  operate heavy machinery, perform activities at heights, swimming or participation in water activities or provide baby sitting services if your were admitted for syncope or siezures until you have seen by Primary MD or a Neurologist and advised to do so again.  Do not drive when taking Pain medications.  Do not take more than prescribed Pain, Sleep and Anxiety Medications  Wear Seat belts while driving.   Please note You were cared for by a hospitalist during your hospital stay. If you have any questions about your discharge medications or the care you received while you were in the hospital after you are discharged, you can call the unit and asked to speak with the hospitalist on call if the hospitalist that took care of you is not available. Once you are discharged, your primary care physician will handle any further medical issues. Please note that NO REFILLS for any discharge medications will be authorized once you are discharged, as it is imperative that you return to your primary care physician (or establish a relationship with a primary care physician if you do not have one) for your aftercare needs so that they can reassess your need for medications and monitor your lab values.    Allergies as of 12/12/2019      Reactions   Dilaudid [hydromorphone Hcl] Other (See Comments)   Makes pt hyper    Dilaudid [hydromorphone] Other (See Comments)   hyperactiviity   Tegretol [carbamazepine] Hives   Tegretol [carbamazepine] Hives      Medication List    STOP taking these medications   loratadine 10 MG tablet Commonly known as: CLARITIN   losartan 50 MG tablet Commonly known as: COZAAR     TAKE these medications   acetaminophen 500 MG tablet Commonly known as: TYLENOL Take 500-1,000 mg by mouth daily as needed for moderate pain.   amiodarone 200 MG tablet Commonly known as: PACERONE Take 1 tablet (200 mg total) by mouth daily.   ARTIFICIAL TEAR SOLUTION OP Place 1 drop into both  eyes 2 (two) times daily.   Artificial Tears 1.4 % ophthalmic solution Generic drug: polyvinyl alcohol Place 1 drop into both eyes daily with lunch.   cefdinir 300 MG capsule Commonly known as: OMNICEF Take 1 capsule (300 mg total) by mouth 2 (two) times daily for 7 days.   digoxin 0.125 MG tablet Commonly known as: LANOXIN Take 1 tablet (0.125 mg total) by mouth daily. Start taking on: December 13, 2019 What changed:   medication strength  how much to take   famotidine 20 MG tablet Commonly known as: Pepcid One at bedtime What changed:   how much to take  how to take this  when to take this   finasteride 5 MG tablet Commonly known  as: PROSCAR Take 5 mg by mouth daily at 12 noon.   fluticasone 50 MCG/ACT nasal spray Commonly known as: FLONASE Place 2 sprays into both nostrils 2 (two) times daily.   furosemide 40 MG tablet Commonly known as: LASIX Take 1 tablet (40 mg total) by mouth daily. Start taking on: December 13, 2019   metoprolol succinate 100 MG 24 hr tablet Commonly known as: TOPROL-XL Take 1 tablet (100 mg total) by mouth daily. Take with or immediately following a meal. Start taking on: December 13, 2019 What changed:   how much to take  additional instructions   multivitamin with minerals Tabs tablet Take 1 tablet by mouth daily at 12 noon.   PHENobarbital 97.2 MG tablet Commonly known as: LUMINAL Take 97.2 mg by mouth at bedtime.   pravastatin 40 MG tablet Commonly known as: PRAVACHOL Take 40 mg by mouth at bedtime.   saccharomyces boulardii 250 MG capsule Commonly known as: FLORASTOR Take 1 capsule (250 mg total) by mouth 2 (two) times daily for 7 days.   sacubitril-valsartan 24-26 MG Commonly known as: ENTRESTO Take 1 tablet by mouth 2 (two) times daily.   warfarin 3 MG tablet Commonly known as: COUMADIN Take 3 mg by mouth See admin instructions. Mon,Tues,Wed,Thurs,Fri,Sat only       Time coordinating discharge: 35  minutes  The results of significant diagnostics from this hospitalization (including imaging, microbiology, ancillary and laboratory) are listed below for reference.    Procedures and Diagnostic Studies:   DG Chest 2 View  Result Date: 12/09/2019 CLINICAL DATA:  Foot pain and swelling EXAM: CHEST - 2 VIEW COMPARISON:  11/03/2019 FINDINGS: Cardiac shadow is enlarged but stable. The lungs are well aerated bilaterally. No focal infiltrate or sizable effusion is seen. No acute bony abnormality is noted. IMPRESSION: No acute abnormality seen. Electronically Signed   By: Inez Catalina M.D.   On: 12/09/2019 23:32   DG Foot Complete Left  Result Date: 12/09/2019 CLINICAL DATA:  No injury, entire foot is swollen and painful, patient states it has been hurting for 2 weeks EXAM: LEFT FOOT - COMPLETE 3+ VIEW COMPARISON:  None. FINDINGS: There is no evidence of fracture or dislocation. There is no evidence of arthropathy or other focal bone abnormality. Diffuse subcutaneus soft tissue edema. IMPRESSION: No acute displaced fracture or dislocation. Electronically Signed   By: Iven Finn M.D.   On: 12/09/2019 22:05   VAS [place] LOWER EXTREMITY VENOUS (DVT)  Result Date: 12/10/2019  Lower Venous DVTStudy Indications: Swelling.  Limitations: Poor ultrasound/tissue interface. Comparison Study: 10-30-2017 Prior RT lower venous study. Performing Technologist: Darlin Coco  Examination Guidelines: A complete evaluation includes B-mode imaging, spectral Doppler, color Doppler, and power Doppler as needed of all accessible portions of each vessel. Bilateral testing is considered an integral part of a complete examination. Limited examinations for reoccurring indications may be performed as noted. The reflux portion of the exam is performed with the patient in reverse Trendelenburg.  +-----+---------------+---------+-----------+----------+--------------+ RIGHTCompressibilityPhasicitySpontaneityPropertiesThrombus Aging  +-----+---------------+---------+-----------+----------+--------------+ CFV  Full           Yes      Yes                                 +-----+---------------+---------+-----------+----------+--------------+   +---------+---------------+---------+-----------+----------+---------------+ LEFT     CompressibilityPhasicitySpontaneityPropertiesThrombus Aging  +---------+---------------+---------+-----------+----------+---------------+ CFV      Full           Yes  Yes                                  +---------+---------------+---------+-----------+----------+---------------+ SFJ      Full                                                         +---------+---------------+---------+-----------+----------+---------------+ FV Prox  Full                                                         +---------+---------------+---------+-----------+----------+---------------+ FV Mid   Full                                                         +---------+---------------+---------+-----------+----------+---------------+ FV DistalFull                                                         +---------+---------------+---------+-----------+----------+---------------+ PFV      Full                                                         +---------+---------------+---------+-----------+----------+---------------+ POP      Full           Yes      Yes                                  +---------+---------------+---------+-----------+----------+---------------+ PTV      Full                                                         +---------+---------------+---------+-----------+----------+---------------+ PERO                                                  Patent by color +---------+---------------+---------+-----------+----------+---------------+     Summary: RIGHT: - No evidence of common femoral vein obstruction.  LEFT: - There is no evidence of deep vein  thrombosis in the lower extremity. However, portions of this examination were limited- see technologist comments above.  - No cystic structure found in the popliteal fossa.  *See table(s) above for measurements and observations. Electronically signed by Harold Barban MD on 12/10/2019 at 9:03:04 PM.    Final  Labs:   Basic Metabolic Panel: Recent Labs  Lab 12/09/19 1654 12/09/19 1654 12/10/19 1636 12/10/19 1636 12/11/19 0918 12/12/19 0529  NA 138  --  138  --  140 138  K 4.2   < > 4.6   < > 4.1 4.0  CL 102  --  98  --  99 101  CO2 25  --  29  --  29 27  GLUCOSE 125*  --  109*  --  102* 97  BUN 21  --  16  --  16 20  CREATININE 1.10  --  0.95  --  0.81 0.83  CALCIUM 8.6*  --  8.3*  --  8.8* 8.6*  MG 1.8  --  1.9  --   --   --   PHOS  --   --  3.2  --   --   --    < > = values in this interval not displayed.   GFR Estimated Creatinine Clearance: 94.5 mL/min (by C-G formula based on SCr of 0.83 mg/dL). Liver Function Tests: Recent Labs  Lab 12/09/19 1654 12/10/19 1636  AST 24 27  ALT 21 21  ALKPHOS 128* 120  BILITOT 0.7 0.9  PROT 7.3 7.0  ALBUMIN 3.9 3.3*   No results for input(s): LIPASE, AMYLASE in the last 168 hours. No results for input(s): AMMONIA in the last 168 hours. Coagulation profile Recent Labs  Lab 12/10/19 0002 12/10/19 0350 12/11/19 0458 12/12/19 0529  INR 1.5* 1.6* 2.3* 2.1*    CBC: Recent Labs  Lab 12/09/19 1654 12/10/19 1636 12/12/19 0529  WBC 12.4* 10.4 11.8*  NEUTROABS 9.8* 7.0 7.7  HGB 14.1 14.1 14.5  HCT 41.9 42.7 45.1  MCV 93.3 94.3 96.6  PLT 240 252 227   Cardiac Enzymes: No results for input(s): CKTOTAL, CKMB, CKMBINDEX, TROPONINI in the last 168 hours. BNP: Invalid input(s): POCBNP CBG: Recent Labs  Lab 12/11/19 0808 12/11/19 1156 12/11/19 1702 12/11/19 2100 12/12/19 0724  GLUCAP 80 116* 121* 123* 87   D-Dimer No results for input(s): DDIMER in the last 72 hours. Hgb A1c Recent Labs    12/10/19 0004  HGBA1C  5.9*   Lipid Profile No results for input(s): CHOL, HDL, LDLCALC, TRIG, CHOLHDL, LDLDIRECT in the last 72 hours. Thyroid function studies Recent Labs    12/10/19 1636  TSH 1.140   Anemia work up No results for input(s): VITAMINB12, FOLATE, FERRITIN, TIBC, IRON, RETICCTPCT in the last 72 hours. Microbiology Recent Results (from the past 240 hour(s))  Respiratory Panel by RT PCR (Flu A&B, Covid) - Nasopharyngeal Swab     Status: None   Collection Time: 12/09/19  7:14 PM   Specimen: Nasopharyngeal Swab  Result Value Ref Range Status   SARS Coronavirus 2 by RT PCR NEGATIVE NEGATIVE Final    Comment: (NOTE) SARS-CoV-2 target nucleic acids are NOT DETECTED.  The SARS-CoV-2 RNA is generally detectable in upper respiratoy specimens during the acute phase of infection. The lowest concentration of SARS-CoV-2 viral copies this assay can detect is 131 copies/mL. A negative result does not preclude SARS-Cov-2 infection and should not be used as the sole basis for treatment or other patient management decisions. A negative result may occur with  improper specimen collection/handling, submission of specimen other than nasopharyngeal swab, presence of viral mutation(s) within the areas targeted by this assay, and inadequate number of viral copies (<131 copies/mL). A negative result must be combined with clinical observations, patient history, and epidemiological information. The expected  result is Negative.  Fact Sheet for Patients:  PinkCheek.be  Fact Sheet for Healthcare Providers:  GravelBags.it  This test is no t yet approved or cleared by the [place] FDA and  has been authorized for detection and/or diagnosis of SARS-CoV-2 by FDA under an Emergency Use Authorization (EUA). This EUA will remain  in effect (meaning this test can be used) for the duration of the COVID-19 declaration under Section 564(b)(1) of the Act, 21  U.S.C. section 360bbb-3(b)(1), unless the authorization is terminated or revoked sooner.     Influenza A by PCR NEGATIVE NEGATIVE Final   Influenza B by PCR NEGATIVE NEGATIVE Final    Comment: (NOTE) The Xpert Xpress SARS-CoV-2/FLU/RSV assay is intended as an aid in  the diagnosis of influenza from Nasopharyngeal swab specimens and  should not be used as a sole basis for treatment. Nasal washings and  aspirates are unacceptable for Xpert Xpress SARS-CoV-2/FLU/RSV  testing.  Fact Sheet for Patients: PinkCheek.be  Fact Sheet for Healthcare Providers: GravelBags.it  This test is not yet approved or cleared by the [place] FDA and  has been authorized for detection and/or diagnosis of SARS-CoV-2 by  FDA under an Emergency Use Authorization (EUA). This EUA will remain  in effect (meaning this test can be used) for the duration of the  Covid-19 declaration under Section 564(b)(1) of the Act, 21  U.S.C. section 360bbb-3(b)(1), unless the authorization is  terminated or revoked. Performed at [facility], [place] [address], [place], [place] [address]   Culture, blood (routine x 2)     Status: None (Preliminary result)   Collection Time: 12/09/19  7:15 PM   Specimen: BLOOD  Result Value Ref Range Status   Specimen Description   Final    BLOOD RIGHT ANTECUBITAL Performed at [place] [address] [place], [place] [address]    Special Requests   Final    BOTTLES DRAWN AEROBIC AND ANAEROBIC Blood Culture adequate volume Performed at [facility] [address], [place], [place] [address]    Culture   Final    NO GROWTH 3 DAYS Performed at [facility], [place] [address] [place], [place] [address]    Report Status PENDING  Incomplete  Culture, blood (routine x 2)     Status: None (Preliminary result)   Collection Time: 12/09/19  7:25 PM   Specimen: BLOOD  RIGHT HAND  Result Value Ref Range Status   Specimen Description   Final    BLOOD RIGHT HAND Performed at [facility] [address], [place], [place] [address]    Special Requests   Final    BOTTLES DRAWN AEROBIC AND ANAEROBIC Blood Culture adequate volume Performed at [facility] [address], [place], [place] [address]    Culture   Final    NO GROWTH 3 DAYS Performed at [facility], [place] [address] [place], [place] [address]    Report Status PENDING  Incomplete  MRSA PCR Screening     Status: None   Collection Time: 12/09/19 10:11 PM   Specimen: Nasal Mucosa; Nasopharyngeal  Result Value Ref Range Status   MRSA by PCR NEGATIVE NEGATIVE Final    Comment:        The GeneXpert MRSA Assay (FDA approved for NASAL specimens only), is one component of a comprehensive MRSA colonization surveillance program. It is not intended to diagnose MRSA infection nor to guide or monitor treatment for  MRSA infections. Performed at [facility], [place] [address] [place], [place] [address]      Signed: Marlowe Aschoff Ariele Vidrio  [facility] 12/12/2019, 11:29 AM

## 2019-12-12 NOTE — Progress Notes (Signed)
ANTICOAGULATION CONSULT NOTE   Pharmacy Consult for warfarin Indication: atrial fibrillation  Allergies  Allergen Reactions   Dilaudid [Hydromorphone Hcl] Other (See Comments)    Makes pt hyper    Dilaudid [Hydromorphone] Other (See Comments)    hyperactiviity   Tegretol [Carbamazepine] Hives   Tegretol [Carbamazepine] Hives    Patient Measurements: Height: _0  (172.7 cm) Weight: 118 kg (260 lb 2.3 oz) IBW/kg (Calculated) : 68.4   Vital Signs: Temp: 98 F (36.7 C) (10/08 0554) BP: 132/72 (10/08 0948) Pulse Rate: 68 (10/08 0948)  Labs: Recent Labs    12/09/19 1654 12/09/19 1654 12/10/19 0002 12/10/19 0002 12/10/19 0350 12/10/19 1636 12/11/19 0458 12/11/19 0918 12/12/19 0529  HGB 14.1   < >  --   --   --  14.1  --   --  14.5  HCT 41.9  --   --   --   --  42.7  --   --  45.1  PLT 240  --   --   --   --  252  --   --  227  LABPROT  --   --  18.0*   < > 18.9*  --  24.5*  --  22.4*  INR  --   --  1.5*   < > 1.6*  --  2.3*  --  2.1*  CREATININE 1.10   < >  --   --   --  0.95  --  0.81 0.83  TROPONINIHS  --   --  9  --   --   --   --   --   --    < > = values in this interval not displayed.    Estimated Creatinine Clearance: 94.5 mL/min (by C-G formula based on SCr of 0.83 mg/dL).   Assessment:  74 y.o. male with medical history significant of HLD, prediabetes, HTN, BPH, paroxysmal atrial fibrillation on anticoagulation, Covid positive in August 2021 status post monoclonal antibodies, COPD, morbid severe obesity, acquired thrombophilia, dilated cardiomyopathy.  Pt discharged home on elquis but could not afford and switched back to warfarin.  Home dose 54m daily X Suday.  Last dose 10/4.   12/12/2019 INR 2.1 - remains therapeutic CBC WNL No bleeding reported  Goal of Therapy:  INR 2-3   Plan:  Continue Warfarin 3 mg daily Daily INR  MEudelia Bunch Pharm.D 12/12/2019 9:56 AM

## 2019-12-12 NOTE — Progress Notes (Addendum)
Progress Note  Patient Name: Ruben Murray Date of Encounter: 12/12/2019  Primary Cardiologist:  [name], MD  Subjective   Thinks breathing back to baseline, but still has not been up much  Inpatient Medications    Scheduled Meds: . amiodarone  200 mg Oral Daily  . digoxin  0.125 mg Oral Daily  . docusate sodium  100 mg Oral BID  . famotidine  20 mg Oral QHS  . finasteride  5 mg Oral Q1200  . furosemide  40 mg Oral Daily  . insulin aspart  0-9 Units Subcutaneous TID AC & HS  . loratadine  10 mg Oral Q1200  . metoprolol succinate  100 mg Oral Daily  . PHENobarbital  97.2 mg Oral QHS  . pravastatin  40 mg Oral QHS  . sacubitril-valsartan  1 tablet Oral BID  . sodium chloride flush  3 mL Intravenous Q12H  . warfarin  3 mg Oral q1600  . Warfarin - Pharmacist Dosing Inpatient   Does not apply q1600   Continuous Infusions: . ceFEPime (MAXIPIME) IV     PRN Meds: acetaminophen **OR** acetaminophen, albuterol, HYDROcodone-acetaminophen   Vital Signs    Vitals:   12/11/19 1353 12/11/19 2027 12/12/19 0554 12/12/19 0947  BP: 109/61 (!) 156/73 115/64   Pulse: 64 66 77 68  Resp: _0 Temp: 98.1 F (36.7 C) (!) 97.5 F (36.4 C) 98 F (36.7 C)   TempSrc: Oral Oral    SpO2: 92% 100% 96%   Weight:      Height:        Intake/Output Summary (Last 24 hours) at 12/12/2019 0950 Last data filed at 12/12/2019 0554 Gross per 24 hour  Intake 683 ml  Output 1550 ml  Net -867 ml   Filed Weights   12/11/19 0450 12/11/19 0644  Weight: 117.7 kg 118 kg   Last Weight  Most recent update: 12/11/2019  6:44 AM    Weight  118 kg (260 lb 2.3 oz)            Weight change:    Telemetry    Atrial flutter, controlled rate- Personally Reviewed  ECG    None today - Personally Reviewed  Physical Exam   GEN: No acute distress.   Neck:  JVD 9 cm Cardiac: slightly irreg R&R, no murmur, no rubs, or gallops.  Respiratory: generally clear. GI: Soft, nontender,  non-distended  MS: bilateral pedal edema, erythema R foot ; No deformity. Neuro:  Nonfocal  Psych: Normal affect   Labs    Hematology Recent Labs  Lab 12/09/19 1654 12/10/19 1636 12/12/19 0529  WBC 12.4* 10.4 11.8*  RBC 4.49 4.53 4.67  HGB 14.1 14.1 14.5  HCT 41.9 42.7 45.1  MCV 93.3 94.3 96.6  MCH 31.4 31.1 31.0  MCHC 33.7 33.0 32.2  RDW 13.5 13.6 13.6  PLT 240 252 227    Chemistry Recent Labs  Lab 12/09/19 1654 12/09/19 1654 12/10/19 1636 12/11/19 0918 12/12/19 0529  NA 138   < > 138 140 138  K 4.2   < > 4.6 4.1 4.0  CL 102   < > 98 99 101  CO2 25   < > _1 GLUCOSE 125*   < > 109* 102* 97  BUN 21   < > _2 CREATININE 1.10   < > 0.95 0.81 0.83  CALCIUM 8.6*   < > 8.3* 8.8* 8.6*  PROT 7.3  --  7.0  --   --  ALBUMIN 3.9  --  3.3*  --   --   AST 24  --  27  --   --   ALT 21  --  21  --   --   ALKPHOS 128*  --  120  --   --   BILITOT 0.7  --  0.9  --   --   GFRNONAA >60   < > >60 >60 >60  ANIONGAP 11   < > _0 < > = values in this interval not displayed.     High Sensitivity Troponin:   Recent Labs  Lab 12/10/19 0002  TROPONINIHS 9     Lab Results  Component Value Date   TSH 1.140 12/10/2019   BNP Recent Labs  Lab 12/09/19 1654  BNP 80.6     Radiology    DG Chest 2 View  Result Date: 12/09/2019 CLINICAL DATA:  Foot pain and swelling EXAM: CHEST - 2 VIEW COMPARISON:  11/03/2019 FINDINGS: Cardiac shadow is enlarged but stable. The lungs are well aerated bilaterally. No focal infiltrate or sizable effusion is seen. No acute bony abnormality is noted. IMPRESSION: No acute abnormality seen. Electronically Signed   By: Inez Catalina M.D.   On: 12/09/2019 23:32   DG Foot Complete Left  Result Date: 12/09/2019 CLINICAL DATA:  No injury, entire foot is swollen and painful, patient states it has been hurting for 2 weeks EXAM: LEFT FOOT - COMPLETE 3+ VIEW COMPARISON:  None. FINDINGS: There is no evidence of fracture or dislocation.  There is no evidence of arthropathy or other focal bone abnormality. Diffuse subcutaneus soft tissue edema. IMPRESSION: No acute displaced fracture or dislocation. Electronically Signed   By: Iven Finn M.D.   On: 12/09/2019 22:05   VAS [place] LOWER EXTREMITY VENOUS (DVT)  Result Date: 12/10/2019  Lower Venous DVTStudy Indications: Swelling.  Limitations: Poor ultrasound/tissue interface. Comparison Study: 10-30-2017 Prior RT lower venous study. Performing Technologist: Darlin Coco  Examination Guidelines: A complete evaluation includes B-mode imaging, spectral Doppler, color Doppler, and power Doppler as needed of all accessible portions of each vessel. Bilateral testing is considered an integral part of a complete examination. Limited examinations for reoccurring indications may be performed as noted. The reflux portion of the exam is performed with the patient in reverse Trendelenburg.  +-----+---------------+---------+-----------+----------+--------------+ RIGHTCompressibilityPhasicitySpontaneityPropertiesThrombus Aging +-----+---------------+---------+-----------+----------+--------------+ CFV  Full           Yes      Yes                                 +-----+---------------+---------+-----------+----------+--------------+   +---------+---------------+---------+-----------+----------+---------------+ LEFT     CompressibilityPhasicitySpontaneityPropertiesThrombus Aging  +---------+---------------+---------+-----------+----------+---------------+ CFV      Full           Yes      Yes                                  +---------+---------------+---------+-----------+----------+---------------+ SFJ      Full                                                         +---------+---------------+---------+-----------+----------+---------------+ FV Prox  Full                                                          +---------+---------------+---------+-----------+----------+---------------+  FV Mid   Full                                                         +---------+---------------+---------+-----------+----------+---------------+ FV DistalFull                                                         +---------+---------------+---------+-----------+----------+---------------+ PFV      Full                                                         +---------+---------------+---------+-----------+----------+---------------+ POP      Full           Yes      Yes                                  +---------+---------------+---------+-----------+----------+---------------+ PTV      Full                                                         +---------+---------------+---------+-----------+----------+---------------+ PERO                                                  Patent by color +---------+---------------+---------+-----------+----------+---------------+     Summary: RIGHT: - No evidence of common femoral vein obstruction.  LEFT: - There is no evidence of deep vein thrombosis in the lower extremity. However, portions of this examination were limited- see technologist comments above.  - No cystic structure found in the popliteal fossa.  *See table(s) above for measurements and observations. Electronically signed by Harold Barban MD on 12/10/2019 at 9:03:04 PM.    Final      Cardiac Studies   ECHO: 11/04/2019  1. Left ventricular ejection fraction, by estimation, is 20 to 25%. The  left ventricle has severely decreased function. The left ventricle  demonstrates global hypokinesis. The left ventricular internal cavity size  was moderately dilated. Left  ventricular diastolic parameters are indeterminate.   2. Right ventricular systolic function is moderately reduced. The right  ventricular size is moderately enlarged. There is mildly elevated  pulmonary artery systolic  pressure. The estimated right ventricular  systolic pressure is 05.6 mmHg.   3. Left atrial size was mildly dilated.   4. Right atrial size was moderately dilated.   5. The mitral valve is normal in structure. Mild mitral valve  regurgitation. No evidence of mitral stenosis.   6. The aortic valve is tricuspid. Aortic valve regurgitation is mild. No  aortic stenosis is present.   7. The inferior vena cava is dilated in size with <50% respiratory  variability,  suggesting right atrial pressure of 15 mmHg.  8. LV E/e' medial:  9.2   VENOUS DOPPLERS: 12/10/2019   Summary:  RIGHT:  - No evidence of common femoral vein obstruction.     LEFT:  - There is no evidence of deep vein thrombosis in the lower extremity.  However, portions of this examination were limited- see technologist  comments above.     - No cystic structure found in the popliteal fossa.   Patient Profile     74 y.o. male w/ hx persistent Aflutter on Eliquis & BB, NICM, OSA not on CPAP, HTN, pre-DM, HLD, COPD, COVID 10/2019, S-D-CHF, BPH, acquired thrombophilia.  He was admitted 10/05 with L foot cellulitis. Cards saw for the atrial flutter and CHF exacerbation.  Assessment & Plan    1. Acute on Chronic systolic CHF - volume status improved w/ IV Lasix, now back on po rx - continue po rx, recommend daily wts - has chronic JVD from COPD, untreated OSA >> cor pulmonale  2. Persistent atrial flutter:  - rate controlled on dig, amio, Toprol XL - home rx of dig 0.25 mg >> 0.125 mg qd due to dig level 1.4 - amio at base dose 200 mg qd - Toprol XL decreased from 200 mg qd >> 100 mg qd - BP/HR ok, continue current meds   Otherwise, per IM Active Problems:   Essential hypertension   Hyperlipidemia   Seizures (HCC)   COPD GOLD II    Morbid (severe) obesity due to excess calories (HCC)   Persistent atrial fibrillation (HCC)   Acquired thrombophilia ([place])   Dilated cardiomyopathy (HCC)   Cellulitis   Chronic  systolic CHF (congestive heart failure) (HCC)   Acute on chronic systolic CHF (congestive heart failure) ([place])    Signed, Rosaria Ferries , PA-C 9:50 AM 12/12/2019 Pager: [phone]   I have seen and examined the patient along with Rosaria Ferries , PA-C, PA NP.  I have reviewed the chart, notes and new data.  I agree with PA/NP's note.  PLAN: He is being discharged. Will make sure that he has a fu visit with Dr. [name]. Reduced digoxin to 0.125 mg daily.  Sanda Klein, MD, [place] [phone] 12/12/2019, 1:26 PM

## 2019-12-12 NOTE — Discharge Instructions (Signed)
Cellulitis, Adult  Cellulitis is a skin infection. The infected area is usually warm, red, swollen, and tender. This condition occurs most often in the arms and lower legs. The infection can travel to the muscles, blood, and underlying tissue and become serious. It is very important to get treated for this condition. What are the causes? Cellulitis is caused by bacteria. The bacteria enter through a break in the skin, such as a cut, burn, insect bite, open sore, or crack. What increases the risk? This condition is more likely to occur in people who:  Have a weak body defense system (immune system).  Have open wounds on the skin, such as cuts, burns, bites, and scrapes. Bacteria can enter the body through these open wounds.  Are older than 76 years of age.  Have diabetes.  Have a type of long-lasting (chronic) liver disease (cirrhosis) or kidney disease.  Are obese.  Have a skin condition such as: ? Itchy rash (eczema). ? Slow movement of blood in the veins (venous stasis). ? Fluid buildup below the skin (edema).  Have had radiation therapy.  Use IV drugs. What are the signs or symptoms? Symptoms of this condition include:  Redness, streaking, or spotting on the skin.  Swollen area of the skin.  Tenderness or pain when an area of the skin is touched.  Warm skin.  A fever.  Chills.  Blisters. How is this diagnosed? This condition is diagnosed based on a medical history and physical exam. You may also have tests, including:  Blood tests.  Imaging tests. How is this treated? Treatment for this condition may include:  Medicines, such as antibiotic medicines or medicines to treat allergies (antihistamines).  Supportive care, such as rest and application of cold or warm cloths (compresses) to the skin.  Hospital care, if the condition is severe. The infection usually starts to get better within 1-2 days of treatment. Follow these instructions at  home:  Medicines  Take over-the-counter and prescription medicines only as told by your health care provider.  If you were prescribed an antibiotic medicine, take it as told by your health care provider. Do not stop taking the antibiotic even if you start to feel better. General instructions  Drink enough fluid to keep your urine pale yellow.  Do not touch or rub the infected area.  Raise (elevate) the infected area above the level of your heart while you are sitting or lying down.  Apply warm or cold compresses to the affected area as told by your health care provider.  Keep all follow-up visits as told by your health care provider. This is important. These visits let your health care provider make sure a more serious infection is not developing. Contact a health care provider if:  You have a fever.  Your symptoms do not begin to improve within 1-2 days of starting treatment.  Your bone or joint underneath the infected area becomes painful after the skin has healed.  Your infection returns in the same area or another area.  You notice a swollen bump in the infected area.  You develop new symptoms.  You have a general ill feeling (malaise) with muscle aches and pains. Get help right away if:  Your symptoms get worse.  You feel very sleepy.  You develop vomiting or diarrhea that persists.  You notice red streaks coming from the infected area.  Your red area gets larger or turns dark in color. These symptoms may represent a serious problem that is an  emergency. Do not wait to see if the symptoms will go away. Get medical help right away. Call your local emergency services (911 in the [place] Do not drive yourself to the hospital. Summary  Cellulitis is a skin infection. This condition occurs most often in the arms and lower legs.  Treatment for this condition may include medicines, such as antibiotic medicines or antihistamines.  Take over-the-counter and prescription  medicines only as told by your health care provider. If you were prescribed an antibiotic medicine, do not stop taking the antibiotic even if you start to feel better.  Contact a health care provider if your symptoms do not begin to improve within 1-2 days of starting treatment or your symptoms get worse.  Keep all follow-up visits as told by your health care provider. This is important. These visits let your health care provider make sure that a more serious infection is not developing. This information is not intended to replace advice given to you by your health care provider. Make sure you discuss any questions you have with your health care provider. Document Revised: 07/12/2017 Document Reviewed: 07/12/2017 Elsevier Patient Education  [place].   Fungal Nail Infection A fungal nail infection is a common infection of the toenails or fingernails. This condition affects toenails more often than fingernails. It often affects the great, or big, toes. More than one nail may be infected. The condition can be passed from person to person (is contagious). What are the causes? This condition is caused by a fungus. Several types of fungi can cause the infection. These fungi are common in moist and warm areas. If your hands or feet come into contact with the fungus, it may get into a crack in your fingernail or toenail and cause the infection. What increases the risk? The following factors may make you more likely to develop this condition:  Being male.  Being of older age.  Living with someone who has the fungus.  Walking barefoot in areas where the fungus thrives, such as showers or locker rooms.  Wearing shoes and socks that cause your feet to sweat.  Having a nail injury or a recent nail surgery.  Having certain medical conditions, such as: ? Athlete's foot. ? Diabetes. ? Psoriasis. ? Poor circulation. ? A weak body defense system (immune system). What are the signs or  symptoms? Symptoms of this condition include:  A pale spot on the nail.  Thickening of the nail.  A nail that becomes yellow or brown.  A brittle or ragged nail edge.  A crumbling nail.  A nail that has lifted away from the nail bed. How is this diagnosed? This condition is diagnosed with a physical exam. Your health care provider may take a scraping or clipping from your nail to test for the fungus. How is this treated? Treatment is not needed for mild infections. If you have significant nail changes, treatment may include:  Antifungal medicines taken by mouth (orally). You may need to take the medicine for several weeks or several months, and you may not see the results for a long time. These medicines can cause side effects. Ask your health care provider what problems to watch for.  Antifungal nail polish or nail cream. These may be used along with oral antifungal medicines.  Laser treatment of the nail.  Surgery to remove the nail. This may be needed for the most severe infections. It can take a long time, usually up to a year, for the infection  to go away. The infection may also come back. Follow these instructions at home: Medicines  Take or apply over-the-counter and prescription medicines only as told by your health care provider.  Ask your health care provider about using over-the-counter mentholated ointment on your nails. Nail care  Trim your nails often.  Wash and dry your hands and feet every day.  Keep your feet dry: ? Wear absorbent socks, and change your socks frequently. ? Wear shoes that allow air to circulate, such as sandals or canvas tennis shoes. Throw out old shoes.  Do not use artificial nails.  If you go to a nail salon, make sure you choose one that uses clean instruments.  Use antifungal foot powder on your feet and in your shoes. General instructions  Do not share personal items, such as towels or nail clippers.  Do not walk barefoot in  shower rooms or locker rooms.  Wear rubber gloves if you are working with your hands in wet areas.  Keep all follow-up visits as told by your health care provider. This is important. Contact a health care provider if: Your infection is not getting better or it is getting worse after several months. Summary  A fungal nail infection is a common infection of the toenails or fingernails.  Treatment is not needed for mild infections. If you have significant nail changes, treatment may include taking medicine orally and applying medicine to your nails.  It can take a long time, usually up to a year, for the infection to go away. The infection may also come back.  Take or apply over-the-counter and prescription medicines only as told by your health care provider.  Follow instructions for taking care of your nails to help prevent infection from coming back or spreading. This information is not intended to replace advice given to you by your health care provider. Make sure you discuss any questions you have with your health care provider. Document Revised: 06/13/2018 Document Reviewed: 07/27/2017 Elsevier Patient Education  [place].

## 2019-12-12 NOTE — TOC Transition Note (Addendum)
Transition of Care [facility]) - CM/SW Discharge Note   Patient Details  Name: Ruben Murray MRN: [id number] Date of Birth: October 09, 1945  [facility] CM/SW Contact:  Lennart Pall, LCSW Phone Number: 12/12/2019, 3:32 PM   Clinical Narrative:    Pt cleared for discharge today.  HHRN/ PT referrals placed with [facility].  No further TOC needs.   Final next level of care: [place] Barriers to Discharge: Barriers Resolved   Patient Goals and CMS Choice Patient states their goals for this hospitalization and ongoing recovery are:: return home with wife CMS Medicare.gov Compare Post Acute Care list provided to:: Patient Choice offered to / list presented to : Patient  Discharge Placement                       Discharge Plan and Services In-house Referral: Clinical Social Work   Post Acute Care Choice: Home Health          DME Arranged: N/A DME Agency: NA       HH Arranged: RN, PT [place] Agency: [place] (now [facility]) Date [place]: 12/11/19 Time [place]: 1529 Representative spoke with at [place]: [place] ([place]) Interventions     Readmission Risk Interventions Readmission Risk Prevention Plan 12/11/2019  Transportation Screening Complete  PCP or Specialist Appt within 5-7 Days Complete  Home Care Screening Complete  Medication Review (RN CM) Complete  Some recent data might be hidden

## 2019-12-12 NOTE — Care Management Important Message (Signed)
Important Message  Patient Details IM Letter given to the Patient Name: Ruben Murray MRN: [id number] Date of Birth: [birth year]   Medicare Important Message Given:  Yes     [name] 12/12/2019, 10:12 AM

## 2019-12-12 NOTE — Progress Notes (Signed)
Pharmacy Antibiotic Note  Ruben Murray is a 74 y.o. male admitted on 12/09/2019 with cellulitis.  Pharmacy has been consulted for cefepime dosing. 12/12/2019  D#3 full abx AF  WBC 11.8 SCr 0.83 Cellulitis improved per RN report  Plan: Increase cefepime to 2 gm q8 for CrCL > 60 ml/min Consider changing to Keflex   Height: _0  (172.7 cm) Weight: 118 kg (260 lb 2.3 oz) IBW/kg (Calculated) : 68.4  Temp (24hrs), Avg:97.9 F (36.6 C), Min:97.5 F (36.4 C), Max:98.1 F (36.7 C)  Recent Labs  Lab 12/09/19 1654 12/09/19 1925 12/10/19 1636 12/11/19 0918 12/12/19 0529  WBC 12.4*  --  10.4  --  11.8*  CREATININE 1.10  --  0.95 0.81 0.83  LATICACIDVEN 1.4 1.8  --   --   --     Estimated Creatinine Clearance: 94.5 mL/min (by C-G formula based on SCr of 0.83 mg/dL).    Allergies  Allergen Reactions  . Dilaudid [Hydromorphone Hcl] Other (See Comments)    Makes pt hyper   . Dilaudid [Hydromorphone] Other (See Comments)    hyperactiviity  . Tegretol [Carbamazepine] Hives  . Tegretol [Carbamazepine] Hives   Antimicrobials this admission:  10/5 Vanc/Zosyn x 1 10/6 cefepime> 10/6 vanc>>10/7 Dose adjustments this admission:  10/8 cefepime q12> q8 Microbiology results:  10/5 BCx: ngtd 10/5 Flu: neg 10/5 COVID: neg 10/5 MRSA PCR: neg   Thank you for allowing pharmacy to be a part of this patient's care.  Eudelia Bunch, Pharm.D 12/12/2019 9:55 AM

## 2019-12-14 LAB — CULTURE, BLOOD (ROUTINE X 2)
Culture: NO GROWTH
Culture: NO GROWTH
Special Requests: ADEQUATE
Special Requests: ADEQUATE

## 2019-12-15 ENCOUNTER — Other Ambulatory Visit: Payer: Self-pay

## 2019-12-15 NOTE — Patient Outreach (Signed)
[facility]) Care Management  Ruben Murray  12/15/2019   [place] 17-Aug-1945 [id number]  Subjective: Telephone call to patient.  He was recent hospitalized due to left foot cellulitis.  He reports foot is painful with movement.  He states he is not moving around much but is using walker right now.  Patient on antibiotic.  Discussed continued management and observing foot for worsening. He states he feels it is not any worse right now but will continue to monitor.  He is still managing his heart failure.  Discussed heart failure management.  Weight today 251 lbs.  Discussed continued control.  [facility] to call patient for home PT.  Patient states he will be calling PCP for follow up for recent hospitalization.    Objective:   Encounter Medications:  Outpatient Encounter Medications as of 12/15/2019  Medication Sig  . acetaminophen (TYLENOL) 500 MG tablet Take 500-1,000 mg by mouth daily as needed for moderate pain.   Marland Kitchen amiodarone (PACERONE) 200 MG tablet Take 1 tablet (200 mg total) by mouth daily.  . ARTIFICIAL TEAR SOLUTION OP Place 1 drop into both eyes 2 (two) times daily.  . cefdinir (OMNICEF) 300 MG capsule Take 1 capsule (300 mg total) by mouth 2 (two) times daily for 7 days.  . digoxin (LANOXIN) 0.125 MG tablet Take 1 tablet (0.125 mg total) by mouth daily.  . famotidine (PEPCID) 20 MG tablet One at bedtime (Patient taking differently: Take 20 mg by mouth at bedtime. One at bedtime)  . finasteride (PROSCAR) 5 MG tablet Take 5 mg by mouth daily at 12 noon.   . fluticasone (FLONASE) 50 MCG/ACT nasal spray Place 2 sprays into both nostrils 2 (two) times daily.  . furosemide (LASIX) 40 MG tablet Take 1 tablet (40 mg total) by mouth daily.  . metoprolol succinate (TOPROL-XL) 100 MG 24 hr tablet Take 1 tablet (100 mg total) by mouth daily. Take with or immediately following a meal.  . Multiple Vitamin (MULTIVITAMIN WITH MINERALS) TABS tablet Take 1 tablet by  mouth daily at 12 noon.  Marland Kitchen PHENobarbital (LUMINAL) 97.2 MG tablet Take 97.2 mg by mouth at bedtime.   . polyvinyl alcohol (ARTIFICIAL TEARS) 1.4 % ophthalmic solution Place 1 drop into both eyes daily with lunch.  . pravastatin (PRAVACHOL) 40 MG tablet Take 40 mg by mouth at bedtime.   . saccharomyces boulardii (FLORASTOR) 250 MG capsule Take 1 capsule (250 mg total) by mouth 2 (two) times daily for 7 days.  . sacubitril-valsartan (ENTRESTO) 24-26 MG Take 1 tablet by mouth 2 (two) times daily.  Marland Kitchen warfarin (COUMADIN) 3 MG tablet Take 3 mg by mouth See admin instructions. Mon,Tues,Wed,Thurs,Fri,Sat only   No facility-administered encounter medications on file as of 12/15/2019.    Functional Status:  In your present state of health, do you have any difficulty performing the following activities: 12/15/2019 12/09/2019  Hearing? Y N  Comment trouble hearing at times -  Vision? N N  Difficulty concentrating or making decisions? N N  Walking or climbing stairs? Y Y  Comment knee problems -  Dressing or bathing? N N  Doing errands, shopping? N N  Preparing Food and eating ? N -  Using the Toilet? N -  In the past six months, have you accidently leaked urine? N -  Do you have problems with loss of bowel control? N -  Managing your Medications? N -  Managing your Finances? N -  Housekeeping or managing your Housekeeping? N -  Some recent data might be hidden    Fall/Depression Screening: Fall Risk  12/15/2019 11/11/2019 09/12/2019  Falls in the past year? 0 0 0  Number falls in past yr: 0 - -  Injury with Fall? 0 - -  Risk for fall due to : - - -  Follow up - - -   PHQ 2/9 Scores 11/11/2019 06/12/2019 06/07/2018 02/18/2018 11/15/2017  PHQ - 2 Score 0 0 0 1 0    Assessment: Patient with recent hospitalization due to cellulitis. Patient managing and taking medications as prescribed.   Goals Addressed            This Visit's Progress   . Make and Keep All Appointments       Follow Up Date  01/02/21   - call to cancel if needed - keep a calendar with appointment dates    Why is this important?   Part of staying healthy is seeing the doctor for follow-up care.  If you forget your appointments, there are some things you can do to stay on track.    Notes: Patient to call PCP for follow up appointment.     . Track and Manage Symptoms       Follow Up Date 01/02/21  - bring diary to all appointments - know when to call the doctor - track symptoms and what helps feel better or worse    Why is this important?   You will be able to handle your symptoms better if you keep track of them.  Making some simple changes to your lifestyle will help.  Eating healthy is one thing you can do to take good care of yourself.    Notes: Patient managing heart failure well.  Patient weighing daily.         Plan: RN CM will contact patient again in the month of October and patient agrees to next outreach.  [name], RN, MSN [place] Management Care Management Coordinator Direct Line [phone] Cell [phone] Toll Free: [phone]  Fax: [phone]

## 2019-12-16 DIAGNOSIS — J4540 Moderate persistent asthma, uncomplicated: Secondary | ICD-10-CM | POA: Diagnosis not present

## 2019-12-16 DIAGNOSIS — I10 Essential (primary) hypertension: Secondary | ICD-10-CM | POA: Diagnosis not present

## 2019-12-16 DIAGNOSIS — Z7901 Long term (current) use of anticoagulants: Secondary | ICD-10-CM | POA: Diagnosis not present

## 2019-12-16 DIAGNOSIS — J449 Chronic obstructive pulmonary disease, unspecified: Secondary | ICD-10-CM | POA: Diagnosis not present

## 2019-12-16 DIAGNOSIS — G40909 Epilepsy, unspecified, not intractable, without status epilepticus: Secondary | ICD-10-CM | POA: Diagnosis not present

## 2019-12-16 DIAGNOSIS — I4819 Other persistent atrial fibrillation: Secondary | ICD-10-CM | POA: Diagnosis not present

## 2019-12-16 DIAGNOSIS — I5023 Acute on chronic systolic (congestive) heart failure: Secondary | ICD-10-CM | POA: Diagnosis not present

## 2019-12-16 DIAGNOSIS — E119 Type 2 diabetes mellitus without complications: Secondary | ICD-10-CM | POA: Diagnosis not present

## 2019-12-16 DIAGNOSIS — N400 Enlarged prostate without lower urinary tract symptoms: Secondary | ICD-10-CM | POA: Diagnosis not present

## 2019-12-16 DIAGNOSIS — I4892 Unspecified atrial flutter: Secondary | ICD-10-CM | POA: Diagnosis not present

## 2019-12-16 DIAGNOSIS — I5043 Acute on chronic combined systolic (congestive) and diastolic (congestive) heart failure: Secondary | ICD-10-CM | POA: Diagnosis not present

## 2019-12-16 DIAGNOSIS — I420 Dilated cardiomyopathy: Secondary | ICD-10-CM | POA: Diagnosis not present

## 2019-12-16 DIAGNOSIS — I700 Atherosclerosis of aorta: Secondary | ICD-10-CM | POA: Diagnosis not present

## 2019-12-16 DIAGNOSIS — Z9289 Personal history of other medical treatment: Secondary | ICD-10-CM | POA: Diagnosis not present

## 2019-12-16 DIAGNOSIS — K219 Gastro-esophageal reflux disease without esophagitis: Secondary | ICD-10-CM | POA: Diagnosis not present

## 2019-12-16 DIAGNOSIS — I480 Paroxysmal atrial fibrillation: Secondary | ICD-10-CM | POA: Diagnosis not present

## 2019-12-16 DIAGNOSIS — L03116 Cellulitis of left lower limb: Secondary | ICD-10-CM | POA: Diagnosis not present

## 2019-12-16 DIAGNOSIS — I110 Hypertensive heart disease with heart failure: Secondary | ICD-10-CM | POA: Diagnosis not present

## 2019-12-16 NOTE — Progress Notes (Deleted)
Cardiology Office Note   Date:  12/16/2019   ID:  Ruben Murray, DOB 07/25/1945, MRN [id number]  PCP:  [name], MD  Cardiologist: Dr. [name], MD  No chief complaint on file.   History of Present Illness: Ruben Murray is a 74 y.o. male who presents for hospital follow-up, seen for Dr. [name].  He also has a history of systolic CHF with unclear etiology with last echocardiogram in 2020 with LVEF at 40 to 45%, reports of MIs however no report of LHC, HTN, OSA, seizure disorder, aortic atherosclerosis, COPD, HLD and known atrial fibrillation on chronic Coumadin.  Ruben Murray has a history of atrial fibrillation who was seen 11/04/2019 in hospital consultation for the evaluation of atrial flutter. He was initially admitted for left leg cellulitis managed by internal medicine. During hospital consultation, he was noted to have recently had Covid for which he received monoclonal antibody therapy.  He was at his home and began developing weakness and had a collapse without LOC at which time EMS was called which showed wide-complex tachycardia. He was given diltiazem with HR slowing to the 130s found to be in atrial flutter.  Troponins were elevated at 539 with a repeat at 3943 with no anginal symptoms.  Given this he was started on IV heparin infusion with plans for DCCV  Repeat echocardiogram unfortunately showed an LVEF at 20 to 25% with mildly dilated LA and moderately dilated RA with no valvular disease.  Given drop in EF, plan was made for right and left cardiac catheterization with plans to start ARB (or possible ARNI) in the outpatient setting given soft BPs.  LHC performed 11/06/2019 which showed normal coronary arteries with mild pulmonary hypertension with a PA pressure at 34/22 with a mean PA pressure at 28 mmHg. Findings were consistent with dilated nonischemic cardiomyopathy.   Reduce dig to 0.125 mg daily. Oral diuretics. It is likely that he will always have mild ankle  edema and JVD from R heart failure related to cor pulmonale (COPD, untreated OSA), even when optimally diuresed from a left heart point of view.  At hospital sign off, plan was to continue lower dose of digoxin at 0.125 mg daily.  Unfortunately, he was discharged 11/06/2019 and presented back to the ED 12/09/2019 with persistent cellulitis, admitted for IV antibiotic therapy and was seen once again by cardiology for acute systolic CHF.  1.  Chronic systolic CHF: -Maintained on Lasix 40 mg daily   2.  Cellulitis:    3.  Persistent atrial flutter: -Maintain on metoprolol, digoxin and amiodarone  4.  Nonischemic cardiomyopathy: -Most recent cardiac catheterization/echocardiogram showed an EF at 25% -Needs guideline directed medical therapy with consideration of transitioning losartan to Entresto if BP tolerates  5.  OSA:  6.  HTN:   1.  Chronic systolic CHF/nonischemic cardiomyopathy: -Echocardiogram and LHC during recent hospitalization shows a significantly reduced LV function at 20 to 25%.  He was treated for fluid volume overload during his hospital course with IV Lasix which was transitioned to p.o. dosing.  He is felt to have chronic JVD from COPD and untreated OSA   2.  Persistent atrial flutter: -Rates were controlled with digoxin, amiodarone and Toprol-XL with plans for reduced home dosing from 0.73m to 0.125 mg daily given a dig level at 1.4 -He was continued on amiodarone 200 mg daily  3.  Left foot cellulitis -  4.  HTN: -Stable, -Continue  5.  HLD: -Last LDL, -Continue  Past Medical History:  Diagnosis Date   Acquired thrombophilia ([place]) 01/15/2019   Aortic atherosclerosis ([place])    Atrial fibrillation with RVR ([place]) 10/29/2017   CAP (community acquired pneumonia) 03/06/2016   Cardiomyopathy ([place])    a. EF 40-45% in 04/2016, etiology not defined, managed medically.   CHF exacerbation ([place]) 10/29/2017   Chronic laryngitis 10/03/2016   COPD GOLD II   08/02/2016   Quit smoking 2000  PFT's  07/10/2016  FEV1 1.98 (70 % ) ratio 67  p 19 % improvement from saba p nothing  prior to study while of coreg so rec as of 08/02/2016 try off coreg and on bisoprolol      Dysphonia 10/03/2016   Essential hypertension 03/06/2016   Changed from coreg to bisoprolol due to copd with reversible component  08/02/2016 >>>    History of cardioversion    Hoarseness 08/31/2016   Referred to ent 08/31/2016 >>> seen 10/03/16 [name] dx gerd and rhinitis medicamentosa   > improved on f/u 01/31/17 on gerd rx/ flonase and off afrin   Hyperlipidemia    Hypertension    Laryngopharyngeal reflux (LPR) 10/03/2016   Moderate persistent asthma 08/02/2016   Morbid (severe) obesity due to excess calories ([place]) 08/02/2016   Myocardial infarction ([place]) 1980-early 2000s X 2   "mild ones" (03/06/2016)   Nasal polyps 10/03/2016   OSA on CPAP    a. pt states was told by Dr. Maxwell Caul that he would not require CPAP as long as he continued to sleep in recliner which he does for his back.   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   Rhinitis medicamentosa 10/03/2016   Right thyroid nodule 03/06/2016   Seizures ([place])    "take RX daily" (03/06/2016)   Sensorineural hearing loss (SNHL) of both ears 01/31/2017    Past Surgical History:  Procedure Laterality Date   CARDIOVERSION N/A 12/13/2017   Procedure: CARDIOVERSION;  Surgeon: [name], MD;  Location: [facility];  Service: Cardiovascular;  Laterality: N/A;   CARDIOVERSION N/A 09/25/2018   Procedure: CARDIOVERSION;  Surgeon: [name], MD;  Location: [facility] ENDOSCOPY;  Service: Cardiovascular;  Laterality: N/A;   CARDIOVERSION N/A 11/04/2018   Procedure: CARDIOVERSION;  Surgeon: [name], MD;  Location: [facility] ENDOSCOPY;  Service: Cardiovascular;  Laterality: N/A;   CARDIOVERSION N/A  04/11/2019   Procedure: CARDIOVERSION;  Surgeon: [name], MD;  Location: [facility] ENDOSCOPY;  Service: Cardiovascular;  Laterality: N/A;   CIRCUMCISION     JOINT REPLACEMENT     PERCUTANEOUS PINNING TOE FRACTURE Left    "big toe   REPLACEMENT TOTAL KNEE Left    RIGHT/LEFT HEART CATH AND CORONARY ANGIOGRAPHY N/A 11/06/2019   Procedure: RIGHT/LEFT HEART CATH AND CORONARY ANGIOGRAPHY;  Surgeon: [name], MD;  Location: [facility];  Service: Cardiovascular;  Laterality: N/A;   TONSILLECTOMY AND ADENOIDECTOMY       Current Outpatient Medications  Medication Sig Dispense Refill   acetaminophen (TYLENOL) 500 MG tablet Take 500-1,000 mg by mouth daily as needed for moderate pain.      amiodarone (PACERONE) 200 MG tablet Take 1 tablet (200 mg total) by mouth daily. 90 tablet 1   ARTIFICIAL TEAR SOLUTION OP Place 1 drop into both eyes 2 (two) times daily.     cefdinir (OMNICEF) 300 MG capsule Take 1 capsule (  300 mg total) by mouth 2 (two) times daily for 7 days. 14 capsule 0   digoxin (LANOXIN) 0.125 MG tablet Take 1 tablet (0.125 mg total) by mouth daily. 90 tablet 0   famotidine (PEPCID) 20 MG tablet One at bedtime (Patient taking differently: Take 20 mg by mouth at bedtime. One at bedtime) 30 tablet 2   finasteride (PROSCAR) 5 MG tablet Take 5 mg by mouth daily at 12 noon.      fluticasone (FLONASE) 50 MCG/ACT nasal spray Place 2 sprays into both nostrils 2 (two) times daily.     furosemide (LASIX) 40 MG tablet Take 1 tablet (40 mg total) by mouth daily. 90 tablet 0   metoprolol succinate (TOPROL-XL) 100 MG 24 hr tablet Take 1 tablet (100 mg total) by mouth daily. Take with or immediately following a meal. 90 tablet 0   Multiple Vitamin (MULTIVITAMIN WITH MINERALS) TABS tablet Take 1 tablet by mouth daily at 12 noon.     PHENobarbital (LUMINAL) 97.2 MG tablet Take 97.2 mg by mouth at bedtime.      polyvinyl alcohol (ARTIFICIAL TEARS) 1.4 % ophthalmic solution  Place 1 drop into both eyes daily with lunch.     pravastatin (PRAVACHOL) 40 MG tablet Take 40 mg by mouth at bedtime.      saccharomyces boulardii (FLORASTOR) 250 MG capsule Take 1 capsule (250 mg total) by mouth 2 (two) times daily for 7 days. 14 capsule 0   sacubitril-valsartan (ENTRESTO) 24-26 MG Take 1 tablet by mouth 2 (two) times daily. 60 tablet 2   warfarin (COUMADIN) 3 MG tablet Take 3 mg by mouth See admin instructions. Mon,Tues,Wed,Thurs,Fri,Sat only     No current facility-administered medications for this visit.    Allergies:   Dilaudid [hydromorphone hcl], Dilaudid [hydromorphone], Tegretol [carbamazepine], and Tegretol [carbamazepine]    Social History:  The patient  reports that he quit smoking about 21 years ago. His smoking use included cigarettes. He has a 144.00 pack-year smoking history. He has never used smokeless tobacco. He reports that he does not drink alcohol and does not use drugs.   Family History:  The patient's ***family history includes Hypertension in his mother; Other in his father.    ROS:  Please see the history of present illness.   Otherwise, review of systems are positive for {NONE DEFAULTED:18576::"none"}.   All other systems are reviewed and negative.    PHYSICAL EXAM: VS:  There were no vitals taken for this visit. , BMI There is no height or weight on file to calculate BMI. GEN: Well nourished, well developed, in no acute distress HEENT: normal Neck: no JVD, carotid bruits, or masses Cardiac: ***RRR; no murmurs, rubs, or gallops,no edema  Respiratory:  clear to auscultation bilaterally, normal work of breathing GI: soft, nontender, nondistended, + BS MS: no deformity or atrophy Skin: warm and dry, no rash Neuro:  Strength and sensation are intact Psych: euthymic mood, full affect   EKG:  EKG {ACTION; IS/IS VQQ:59563875} ordered today. The ekg ordered today demonstrates ***   Recent Labs: 12/09/2019: B Natriuretic Peptide  80.6 12/10/2019: ALT 21; Magnesium 1.9; TSH 1.140 12/12/2019: BUN 20; Creatinine, Ser 0.83; Hemoglobin 14.5; Platelets 227; Potassium 4.0; Sodium 138    Lipid Panel    Component Value Date/Time   CHOL 114 11/04/2019 0214   TRIG 60 11/04/2019 0214   HDL 34 (L) 11/04/2019 0214   CHOLHDL 3.4 11/04/2019 0214   VLDL 12 11/04/2019 0214   LDLCALC 68 11/04/2019 0214  Wt Readings from Last 3 Encounters:  12/11/19 260 lb 2.3 oz (118 kg)  11/06/19 256 lb 4.8 oz (116.3 kg)  08/31/19 260 lb (117.9 kg)      Other studies Reviewed: Additional studies/ records that were reviewed today include: ***. Review of the above records demonstrates: ***  Echocardiogram:  1. Left ventricular ejection fraction, by estimation, is 20 to 25%. The  left ventricle has severely decreased function. The left ventricle  demonstrates global hypokinesis. The left ventricular internal cavity size  was moderately dilated. Left  ventricular diastolic parameters are indeterminate.  2. Right ventricular systolic function is moderately reduced. The right  ventricular size is moderately enlarged. There is mildly elevated  pulmonary artery systolic pressure. The estimated right ventricular  systolic pressure is 60.6 mmHg.  3. Left atrial size was mildly dilated.  4. Right atrial size was moderately dilated.  5. The mitral valve is normal in structure. Mild mitral valve  regurgitation. No evidence of mitral stenosis.  6. The aortic valve is tricuspid. Aortic valve regurgitation is mild. No  aortic stenosis is present.  7. The inferior vena cava is dilated in size with <50% respiratory  variability, suggesting right atrial pressure of 15 mmHg.   R/LHC 11/06/2019:  Findings are consistent with a dilated nonischemic cardiomyopathy.  Normal coronary arteries with a codominant system.  Mild right heart pressure elevation with mild pulmonary hypertension; PA 34/22, mean PA pressure 28 mm.  Atrial flutter  with variable block and rapid ventricular response.  RECOMMENDATION:  Guideline directed medical therapy for nonischemic cardiomyopathy.  In this patient with recent Covid 19 positivity consider post Covid myocarditis.   ASSESSMENT AND PLAN:  1.  ***   Current medicines are reviewed at length with the patient today.  The patient {ACTIONS; HAS/DOES NOT HAVE:19233} concerns regarding medicines.  The following changes have been made:  {PLAN; NO CHANGE:13088:s}  Labs/ tests ordered today include: *** No orders of the defined types were placed in this encounter.    Disposition:   FU with *** in {gen number 7-70:340352} {Days to years:10300}  Signed, Kathyrn Drown, NP  12/16/2019 8:33 AM    [facility] [address], [place], [place]  [address] Phone: [phone]; Fax: [phone]

## 2019-12-17 DIAGNOSIS — I4892 Unspecified atrial flutter: Secondary | ICD-10-CM | POA: Diagnosis not present

## 2019-12-17 DIAGNOSIS — I420 Dilated cardiomyopathy: Secondary | ICD-10-CM | POA: Diagnosis not present

## 2019-12-17 DIAGNOSIS — J449 Chronic obstructive pulmonary disease, unspecified: Secondary | ICD-10-CM | POA: Diagnosis not present

## 2019-12-17 DIAGNOSIS — E119 Type 2 diabetes mellitus without complications: Secondary | ICD-10-CM | POA: Diagnosis not present

## 2019-12-17 DIAGNOSIS — I4819 Other persistent atrial fibrillation: Secondary | ICD-10-CM | POA: Diagnosis not present

## 2019-12-17 DIAGNOSIS — I110 Hypertensive heart disease with heart failure: Secondary | ICD-10-CM | POA: Diagnosis not present

## 2019-12-17 DIAGNOSIS — I700 Atherosclerosis of aorta: Secondary | ICD-10-CM | POA: Diagnosis not present

## 2019-12-17 DIAGNOSIS — L03116 Cellulitis of left lower limb: Secondary | ICD-10-CM | POA: Diagnosis not present

## 2019-12-17 DIAGNOSIS — I5043 Acute on chronic combined systolic (congestive) and diastolic (congestive) heart failure: Secondary | ICD-10-CM | POA: Diagnosis not present

## 2019-12-18 ENCOUNTER — Ambulatory Visit: Payer: Medicare HMO | Admitting: Podiatry

## 2019-12-18 ENCOUNTER — Other Ambulatory Visit: Payer: Self-pay

## 2019-12-18 ENCOUNTER — Ambulatory Visit (INDEPENDENT_AMBULATORY_CARE_PROVIDER_SITE_OTHER): Payer: Medicare HMO

## 2019-12-18 DIAGNOSIS — M7989 Other specified soft tissue disorders: Secondary | ICD-10-CM

## 2019-12-18 DIAGNOSIS — M79674 Pain in right toe(s): Secondary | ICD-10-CM

## 2019-12-19 LAB — ARTHRITIS PANEL
Anti Nuclear Antibody (ANA): NEGATIVE
Rheumatoid fact SerPl-aCnc: <10 IU/mL (ref 0.0–13.9)
Sed Rate: 8 mm/hr (ref 0–30)
Uric Acid: 8.2 mg/dL (ref 3.8–8.4)

## 2019-12-19 MED ORDER — METHYLPREDNISOLONE 4 MG PO TBPK: ORAL_TABLET | ORAL | 0 refills | Status: DC

## 2019-12-19 NOTE — Addendum Note (Signed)
Addended bySherryle Lis, Azyriah Nevins R on: 12/19/2019 06:57 PM   Modules accepted: Orders

## 2019-12-19 NOTE — Progress Notes (Signed)
  Subjective:  Patient ID: Ruben Murray, male    DOB: 1945-05-31,  MRN: [id number]  Chief Complaint  Patient presents with  . New Patient (Initial Visit)    **Urgent Referral/Work In** np/cellulitis bilaterally;onychomycosis/non req/Dr. [name] refer    74 y.o. male presents with the above complaint. History confirmed with patient.  He has been treated recently for pain in his left foot and on the right foot associated with redness and swelling.  This was first with Dr. Gladstone Lighter, he was then admitted to [facility] for 3 days from 10/5 to 12/12/2019 with a diagnosis of left leg cellulitis.  He was on IV antibiotics including cefepime and vancomycin and he was discharged on cefdinir.  He notes improvement in the redness and pain on the left side but the swelling has not changed much, the right side began a few days later and is now very painful and red as well.  It is all centered around the big toe joint and the same on the left side.  Objective:  Physical Exam: warm, good capillary refill, no trophic changes or ulcerative lesions, normal DP and PT pulses and normal sensory exam. Left Foot: Mild pitting edema throughout the left foot +2, pain with range of motion of the first MTPJ Right Foot: The first MTPJ has significant erythema, painful to touch and is tender to range of motion of the joint brawny edema   Radiographs: X-ray of the right foot: Hallux valgus is present with mild degenerative changes with no subcutaneous gas is present Assessment:   1. Pain and swelling of toe of right foot      Plan:  Patient was evaluated and treated and all questions answered.  -Discussed with him that cellulitis is a skin and soft tissue infection that generally would not be localized only to the first metatarsophalangeal joint and that this should have improved and resolved after 3 days of oral antibiotics and discharge on p.o. antibiotics -I think more likely differential diagnosis  would be gouty arthritis, less likely septic arthritis.  His uric acid was not checked during his admission, he did have a leukocytosis of 12.3 which can be seen in a gouty attack -I discussed with him that we should send him for lab evaluation, and I have ordered a CBC, ESR, CRP, arthritis panel with uric acid. -I plan to prescribe him colchicine, however given possible interactions with his amiodarone, will send for a Medrol Dosepak taper  No follow-ups on file.

## 2019-12-22 DIAGNOSIS — I700 Atherosclerosis of aorta: Secondary | ICD-10-CM | POA: Diagnosis not present

## 2019-12-22 DIAGNOSIS — I110 Hypertensive heart disease with heart failure: Secondary | ICD-10-CM | POA: Diagnosis not present

## 2019-12-22 DIAGNOSIS — I4892 Unspecified atrial flutter: Secondary | ICD-10-CM | POA: Diagnosis not present

## 2019-12-22 DIAGNOSIS — I5043 Acute on chronic combined systolic (congestive) and diastolic (congestive) heart failure: Secondary | ICD-10-CM | POA: Diagnosis not present

## 2019-12-22 DIAGNOSIS — L03116 Cellulitis of left lower limb: Secondary | ICD-10-CM | POA: Diagnosis not present

## 2019-12-22 DIAGNOSIS — E119 Type 2 diabetes mellitus without complications: Secondary | ICD-10-CM | POA: Diagnosis not present

## 2019-12-22 DIAGNOSIS — I4819 Other persistent atrial fibrillation: Secondary | ICD-10-CM | POA: Diagnosis not present

## 2019-12-22 DIAGNOSIS — I420 Dilated cardiomyopathy: Secondary | ICD-10-CM | POA: Diagnosis not present

## 2019-12-22 DIAGNOSIS — J449 Chronic obstructive pulmonary disease, unspecified: Secondary | ICD-10-CM | POA: Diagnosis not present

## 2019-12-24 ENCOUNTER — Ambulatory Visit: Payer: Medicare HMO | Admitting: Cardiology

## 2019-12-24 DIAGNOSIS — L03116 Cellulitis of left lower limb: Secondary | ICD-10-CM | POA: Diagnosis not present

## 2019-12-24 DIAGNOSIS — I4819 Other persistent atrial fibrillation: Secondary | ICD-10-CM | POA: Diagnosis not present

## 2019-12-24 DIAGNOSIS — E119 Type 2 diabetes mellitus without complications: Secondary | ICD-10-CM | POA: Diagnosis not present

## 2019-12-24 DIAGNOSIS — I700 Atherosclerosis of aorta: Secondary | ICD-10-CM | POA: Diagnosis not present

## 2019-12-24 DIAGNOSIS — I4892 Unspecified atrial flutter: Secondary | ICD-10-CM | POA: Diagnosis not present

## 2019-12-24 DIAGNOSIS — I420 Dilated cardiomyopathy: Secondary | ICD-10-CM | POA: Diagnosis not present

## 2019-12-24 DIAGNOSIS — I5043 Acute on chronic combined systolic (congestive) and diastolic (congestive) heart failure: Secondary | ICD-10-CM | POA: Diagnosis not present

## 2019-12-24 DIAGNOSIS — I110 Hypertensive heart disease with heart failure: Secondary | ICD-10-CM | POA: Diagnosis not present

## 2019-12-24 DIAGNOSIS — J449 Chronic obstructive pulmonary disease, unspecified: Secondary | ICD-10-CM | POA: Diagnosis not present

## 2019-12-26 ENCOUNTER — Other Ambulatory Visit: Payer: Self-pay

## 2019-12-26 DIAGNOSIS — R569 Unspecified convulsions: Secondary | ICD-10-CM | POA: Diagnosis not present

## 2019-12-26 DIAGNOSIS — D72820 Lymphocytosis (symptomatic): Secondary | ICD-10-CM | POA: Diagnosis not present

## 2019-12-26 DIAGNOSIS — I420 Dilated cardiomyopathy: Secondary | ICD-10-CM | POA: Diagnosis not present

## 2019-12-26 DIAGNOSIS — R634 Abnormal weight loss: Secondary | ICD-10-CM | POA: Diagnosis not present

## 2019-12-26 DIAGNOSIS — Z7901 Long term (current) use of anticoagulants: Secondary | ICD-10-CM | POA: Diagnosis not present

## 2019-12-26 DIAGNOSIS — I10 Essential (primary) hypertension: Secondary | ICD-10-CM | POA: Diagnosis not present

## 2019-12-26 DIAGNOSIS — I480 Paroxysmal atrial fibrillation: Secondary | ICD-10-CM | POA: Diagnosis not present

## 2019-12-26 DIAGNOSIS — Z79899 Other long term (current) drug therapy: Secondary | ICD-10-CM | POA: Diagnosis not present

## 2019-12-26 NOTE — Patient Outreach (Signed)
[facility]) Care Management  12/26/2019  Ruben Murray 1945-08-12 [id number]   Telephone call to patient for disease management follow up.   No answer.  HIPAA compliant voice message left.    Plan: If no return call, RN CM will attempt patient again within the next 4 business days.    [name], RN, MSN [place] Management Care Management Coordinator Direct Line [phone] Cell [phone] Toll Free: [phone]  Fax: [phone]

## 2019-12-29 DIAGNOSIS — E119 Type 2 diabetes mellitus without complications: Secondary | ICD-10-CM | POA: Diagnosis not present

## 2019-12-29 DIAGNOSIS — I110 Hypertensive heart disease with heart failure: Secondary | ICD-10-CM | POA: Diagnosis not present

## 2019-12-29 DIAGNOSIS — J449 Chronic obstructive pulmonary disease, unspecified: Secondary | ICD-10-CM | POA: Diagnosis not present

## 2019-12-29 DIAGNOSIS — I4892 Unspecified atrial flutter: Secondary | ICD-10-CM | POA: Diagnosis not present

## 2019-12-29 DIAGNOSIS — I5043 Acute on chronic combined systolic (congestive) and diastolic (congestive) heart failure: Secondary | ICD-10-CM | POA: Diagnosis not present

## 2019-12-29 DIAGNOSIS — I420 Dilated cardiomyopathy: Secondary | ICD-10-CM | POA: Diagnosis not present

## 2019-12-29 DIAGNOSIS — I700 Atherosclerosis of aorta: Secondary | ICD-10-CM | POA: Diagnosis not present

## 2019-12-29 DIAGNOSIS — L03116 Cellulitis of left lower limb: Secondary | ICD-10-CM | POA: Diagnosis not present

## 2019-12-29 DIAGNOSIS — I4819 Other persistent atrial fibrillation: Secondary | ICD-10-CM | POA: Diagnosis not present

## 2019-12-30 ENCOUNTER — Other Ambulatory Visit: Payer: Self-pay

## 2019-12-30 NOTE — Patient Outreach (Signed)
[place] [facility]) Care Management  12/30/2019  GILLIAM HAWKES [birth year] [id number]   Telephone call to patient for disease management follow up.   No answer.  Unable to leave a message.  Plan: If no return call, RN CM will attempt patient again within 4 business days and send a letter.   [name], RN, MSN [place] Management Care Management Coordinator Direct Line [phone] Cell [phone] Toll Free: [phone]  Fax: [phone]

## 2019-12-31 ENCOUNTER — Other Ambulatory Visit: Payer: Self-pay

## 2019-12-31 NOTE — Patient Outreach (Addendum)
[place] [facility]) Care Management  12/31/2019  Ruben Murray 1945/08/29 [id number]   Telephone call to patient for disease management follow up.   No answer.  Unable to leave a voice mail.    Plan: If no return call, RN CM will attempt patient again in the month of November.    [name], RN, MSN [place] Management Care Management Coordinator Direct Line [phone] Cell [phone] Toll Free: [phone]  Fax: [phone]

## 2020-01-02 ENCOUNTER — Ambulatory Visit: Payer: Self-pay | Admitting: Podiatry

## 2020-01-05 DIAGNOSIS — L03116 Cellulitis of left lower limb: Secondary | ICD-10-CM | POA: Diagnosis not present

## 2020-01-05 DIAGNOSIS — I700 Atherosclerosis of aorta: Secondary | ICD-10-CM | POA: Diagnosis not present

## 2020-01-05 DIAGNOSIS — I4892 Unspecified atrial flutter: Secondary | ICD-10-CM | POA: Diagnosis not present

## 2020-01-05 DIAGNOSIS — I110 Hypertensive heart disease with heart failure: Secondary | ICD-10-CM | POA: Diagnosis not present

## 2020-01-05 DIAGNOSIS — E119 Type 2 diabetes mellitus without complications: Secondary | ICD-10-CM | POA: Diagnosis not present

## 2020-01-05 DIAGNOSIS — I420 Dilated cardiomyopathy: Secondary | ICD-10-CM | POA: Diagnosis not present

## 2020-01-05 DIAGNOSIS — I5043 Acute on chronic combined systolic (congestive) and diastolic (congestive) heart failure: Secondary | ICD-10-CM | POA: Diagnosis not present

## 2020-01-05 DIAGNOSIS — I4819 Other persistent atrial fibrillation: Secondary | ICD-10-CM | POA: Diagnosis not present

## 2020-01-05 DIAGNOSIS — J449 Chronic obstructive pulmonary disease, unspecified: Secondary | ICD-10-CM | POA: Diagnosis not present

## 2020-01-07 ENCOUNTER — Other Ambulatory Visit: Payer: Self-pay

## 2020-01-07 DIAGNOSIS — I5023 Acute on chronic systolic (congestive) heart failure: Secondary | ICD-10-CM | POA: Diagnosis not present

## 2020-01-07 NOTE — Patient Outreach (Signed)
[facility]) Care Management  01/07/2020  Ruben Murray October 19, 1945 [id number]   Telephone call to patient for follow up.  No answer.  HIPAA compliant voice message left.   Plan: RN CM will attempt patient again in the month of November.    [name], RN, MSN [place] Management Care Management Coordinator Direct Line [phone] Cell [phone] Toll Free: [phone]  Fax: [phone]

## 2020-01-08 ENCOUNTER — Ambulatory Visit: Payer: Self-pay

## 2020-01-08 ENCOUNTER — Other Ambulatory Visit: Payer: Self-pay

## 2020-01-08 NOTE — Patient Outreach (Signed)
[facility]) Care Management  [place]  01/08/2020   Ruben Murray 1945-11-18 [id number]  Subjective: Telephone call to patient for follow up. Patient has new number. Patient reports he is doing better. He reports that left foot is doing better.  He states his weight is 240 lbs. He states he has not had an appetite recently but otherwise ok. Discussed heart failure and cellulitis/gout.  He verbalized understanding and voices no concerns.    Objective:   Encounter Medications:  Outpatient Encounter Medications as of 01/08/2020  Medication Sig  . acetaminophen (TYLENOL) 500 MG tablet Take 500-1,000 mg by mouth daily as needed for moderate pain.   Marland Kitchen amiodarone (PACERONE) 200 MG tablet Take 1 tablet (200 mg total) by mouth daily.  Marland Kitchen apixaban (ELIQUIS) 5 MG TABS tablet Eliquis 5 mg tablet  TAKE 1 TABLET BY MOUTH TWICE DAILY  . ARTIFICIAL TEAR SOLUTION OP Place 1 drop into both eyes 2 (two) times daily.  . benzonatate (TESSALON) 200 MG capsule Take 200 mg by mouth 3 (three) times daily as needed.  . cephALEXin (KEFLEX) 500 MG capsule cephalexin 500 mg capsule  TAKE 1 CAPSULE BY MOUTH 4 TIMES DAILY FOR 7 DAYS  . ciclopirox (LOPROX) 0.77 % cream ciclopirox 0.77 % topical cream  APPLY TOPICALLY TWICE DAILY FOR 30 DAYS  . digoxin (LANOXIN) 0.125 MG tablet Take 1 tablet (0.125 mg total) by mouth daily.  Marland Kitchen doxycycline (VIBRAMYCIN) 100 MG capsule Take 100 mg by mouth 2 (two) times daily.  . famotidine (PEPCID) 20 MG tablet One at bedtime (Patient taking differently: Take 20 mg by mouth at bedtime. One at bedtime)  . finasteride (PROSCAR) 5 MG tablet Take 5 mg by mouth daily at 12 noon.   . fluticasone (FLONASE) 50 MCG/ACT nasal spray Place 2 sprays into both nostrils 2 (two) times daily.  . furosemide (LASIX) 40 MG tablet Take 1 tablet (40 mg total) by mouth daily.  . methylPREDNISolone (MEDROL DOSEPAK) 4 MG TBPK tablet 6 day dose pack - take as directed  . metoprolol  succinate (TOPROL-XL) 100 MG 24 hr tablet Take 1 tablet (100 mg total) by mouth daily. Take with or immediately following a meal.  . metoprolol tartrate (LOPRESSOR) 100 MG tablet metoprolol tartrate 100 mg tablet  TAKE 1 2 TABLET BY MOUTH TWICE DAILY  . Multiple Vitamin (MULTIVITAMIN WITH MINERALS) TABS tablet Take 1 tablet by mouth daily at 12 noon.  Marland Kitchen PHENobarbital (LUMINAL) 97.2 MG tablet Take 97.2 mg by mouth at bedtime.   . polyvinyl alcohol (ARTIFICIAL TEARS) 1.4 % ophthalmic solution Place 1 drop into both eyes daily with lunch.  . potassium chloride (KLOR-CON) 10 MEQ tablet potassium chloride ER 10 mEq tablet,extended release(part/cryst)  TAKE 1 TABLET BY MOUTH ONCE DAILY  . potassium chloride (MICRO-K) 10 MEQ CR capsule   . pravastatin (PRAVACHOL) 40 MG tablet Take 40 mg by mouth at bedtime.   . sacubitril-valsartan (ENTRESTO) 24-26 MG Take 1 tablet by mouth 2 (two) times daily.  Marland Kitchen SHINGRIX injection   . warfarin (COUMADIN) 3 MG tablet Take 3 mg by mouth See admin instructions. Mon,Tues,Wed,Thurs,Fri,Sat only   No facility-administered encounter medications on file as of 01/08/2020.    Functional Status:  In your present state of health, do you have any difficulty performing the following activities: 12/15/2019 12/09/2019  Hearing? Y N  Comment trouble hearing at times -  Vision? N N  Difficulty concentrating or making decisions? N N  Walking or climbing stairs?  Y Y  Comment knee problems -  Dressing or bathing? N N  Doing errands, shopping? N N  Preparing Food and eating ? N -  Using the Toilet? N -  In the past six months, have you accidently leaked urine? N -  Do you have problems with loss of bowel control? N -  Managing your Medications? N -  Managing your Finances? N -  Housekeeping or managing your Housekeeping? N -  Some recent data might be hidden    Fall/Depression Screening: Fall Risk  12/15/2019 11/11/2019 09/12/2019  Falls in the past year? 0 0 0  Number falls  in past yr: 0 - -  Injury with Fall? 0 - -  Risk for fall due to : - - -  Follow up - - -   PHQ 2/9 Scores 11/11/2019 06/12/2019 06/07/2018 02/18/2018 11/15/2017  PHQ - 2 Score 0 0 0 1 0    Assessment: Patient continues recovery from recent hospitalization.  Patient continues to benefit from disease management support.   Goals Addressed            This Visit's Progress   . Make and Keep All Appointments   On track    Follow Up Date 01/02/21   - call to cancel if needed - keep a calendar with appointment dates    Why is this important?   Part of staying healthy is seeing the doctor for follow-up care.  If you forget your appointments, there are some things you can do to stay on track.    Notes: Patient to call PCP for follow up appointment.     . Track and Manage Symptoms   On track    Follow Up Date 01/02/21  - bring diary to all appointments - know when to call the doctor - track symptoms and what helps feel better or worse    Why is this important?   You will be able to handle your symptoms better if you keep track of them.  Making some simple changes to your lifestyle will help.  Eating healthy is one thing you can do to take good care of yourself.    Notes: Patient managing heart failure well.  Patient weighing daily.         Plan: RN CM will contact patient again in the month of November and patient agreeable.     [name], RN, MSN [place] Management Care Management Coordinator Direct Line [phone] Cell [phone] Toll Free: [phone]  Fax: [phone]

## 2020-01-09 ENCOUNTER — Encounter: Payer: Self-pay | Admitting: Podiatry

## 2020-01-09 ENCOUNTER — Other Ambulatory Visit: Payer: Self-pay

## 2020-01-09 ENCOUNTER — Ambulatory Visit: Payer: Medicare HMO | Admitting: Podiatry

## 2020-01-09 DIAGNOSIS — B351 Tinea unguium: Secondary | ICD-10-CM

## 2020-01-09 DIAGNOSIS — M79674 Pain in right toe(s): Secondary | ICD-10-CM

## 2020-01-09 DIAGNOSIS — M2012 Hallux valgus (acquired), left foot: Secondary | ICD-10-CM | POA: Diagnosis not present

## 2020-01-09 DIAGNOSIS — M21612 Bunion of left foot: Secondary | ICD-10-CM

## 2020-01-09 DIAGNOSIS — M21611 Bunion of right foot: Secondary | ICD-10-CM

## 2020-01-09 DIAGNOSIS — M1A3710 Chronic gout due to renal impairment, right ankle and foot, without tophus (tophi): Secondary | ICD-10-CM

## 2020-01-09 DIAGNOSIS — M2011 Hallux valgus (acquired), right foot: Secondary | ICD-10-CM | POA: Diagnosis not present

## 2020-01-09 DIAGNOSIS — M79675 Pain in left toe(s): Secondary | ICD-10-CM

## 2020-01-09 NOTE — Progress Notes (Signed)
  Subjective:  Patient ID: Ruben Murray, male    DOB: 1945/11/19,  MRN: [id number]  Chief Complaint  Patient presents with  . Foot Pain    PT stated that he is doing much better he has no concerns at this time but would like his nails to be trimmed     74 y.o. male returns with the above complaint. History confirmed with patient.  His symptoms nearly fully resolved after the Medrol Dosepak taper and is doing much better.  He is here today for debridement of his toenails and further treatment..  Objective:  Physical Exam: warm, good capillary refill, no trophic changes or ulcerative lesions, normal DP and PT pulses and normal sensory exam.  No edema or redness or pain today.  He is onychomycosis x10.   Assessment:   1. 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 Hallux valgus with bunions, left   3. Hallux valgus with bunions, right   4. Onychomycosis   5. Pain due to onychomycosis of toenails of both feet      Plan:  Patient was evaluated and treated and all questions answered.  -Gout is stable and has not returned.  I advised him that if this returns he should return for further treatment but appears to have resolved.  If he has continuous gout he should discuss this with his PCP for chronic gout management.  Discussed the etiology and treatment options for the condition in detail with the patient. Educated patient on the topical and oral treatment options for mycotic nails. Recommended debridement of the nails today. Sharp and mechanical debridement performed of all painful and mycotic nails today. Nails debrided in length and thickness using a nail nipper and a mechanical burr to level of comfort. Discussed treatment options including appropriate shoe gear. Follow up as needed for painful nails.   Return in about 3 months (around 04/10/2020) for [facility].

## 2020-01-14 DIAGNOSIS — J449 Chronic obstructive pulmonary disease, unspecified: Secondary | ICD-10-CM | POA: Diagnosis not present

## 2020-01-14 DIAGNOSIS — I700 Atherosclerosis of aorta: Secondary | ICD-10-CM | POA: Diagnosis not present

## 2020-01-14 DIAGNOSIS — L03116 Cellulitis of left lower limb: Secondary | ICD-10-CM | POA: Diagnosis not present

## 2020-01-14 DIAGNOSIS — I4819 Other persistent atrial fibrillation: Secondary | ICD-10-CM | POA: Diagnosis not present

## 2020-01-14 DIAGNOSIS — E119 Type 2 diabetes mellitus without complications: Secondary | ICD-10-CM | POA: Diagnosis not present

## 2020-01-14 DIAGNOSIS — I110 Hypertensive heart disease with heart failure: Secondary | ICD-10-CM | POA: Diagnosis not present

## 2020-01-14 DIAGNOSIS — I4892 Unspecified atrial flutter: Secondary | ICD-10-CM | POA: Diagnosis not present

## 2020-01-14 DIAGNOSIS — I5043 Acute on chronic combined systolic (congestive) and diastolic (congestive) heart failure: Secondary | ICD-10-CM | POA: Diagnosis not present

## 2020-01-14 DIAGNOSIS — I420 Dilated cardiomyopathy: Secondary | ICD-10-CM | POA: Diagnosis not present

## 2020-01-15 DIAGNOSIS — I4819 Other persistent atrial fibrillation: Secondary | ICD-10-CM | POA: Diagnosis not present

## 2020-01-15 DIAGNOSIS — E119 Type 2 diabetes mellitus without complications: Secondary | ICD-10-CM | POA: Diagnosis not present

## 2020-01-15 DIAGNOSIS — I420 Dilated cardiomyopathy: Secondary | ICD-10-CM | POA: Diagnosis not present

## 2020-01-15 DIAGNOSIS — L03116 Cellulitis of left lower limb: Secondary | ICD-10-CM | POA: Diagnosis not present

## 2020-01-15 DIAGNOSIS — I4892 Unspecified atrial flutter: Secondary | ICD-10-CM | POA: Diagnosis not present

## 2020-01-15 DIAGNOSIS — I5043 Acute on chronic combined systolic (congestive) and diastolic (congestive) heart failure: Secondary | ICD-10-CM | POA: Diagnosis not present

## 2020-01-15 DIAGNOSIS — I110 Hypertensive heart disease with heart failure: Secondary | ICD-10-CM | POA: Diagnosis not present

## 2020-01-15 DIAGNOSIS — I700 Atherosclerosis of aorta: Secondary | ICD-10-CM | POA: Diagnosis not present

## 2020-01-15 DIAGNOSIS — J449 Chronic obstructive pulmonary disease, unspecified: Secondary | ICD-10-CM | POA: Diagnosis not present

## 2020-01-16 DIAGNOSIS — I420 Dilated cardiomyopathy: Secondary | ICD-10-CM | POA: Diagnosis not present

## 2020-01-16 DIAGNOSIS — I110 Hypertensive heart disease with heart failure: Secondary | ICD-10-CM | POA: Diagnosis not present

## 2020-01-16 DIAGNOSIS — E119 Type 2 diabetes mellitus without complications: Secondary | ICD-10-CM | POA: Diagnosis not present

## 2020-01-16 DIAGNOSIS — I4819 Other persistent atrial fibrillation: Secondary | ICD-10-CM | POA: Diagnosis not present

## 2020-01-16 DIAGNOSIS — L03116 Cellulitis of left lower limb: Secondary | ICD-10-CM | POA: Diagnosis not present

## 2020-01-16 DIAGNOSIS — J449 Chronic obstructive pulmonary disease, unspecified: Secondary | ICD-10-CM | POA: Diagnosis not present

## 2020-01-16 DIAGNOSIS — I4892 Unspecified atrial flutter: Secondary | ICD-10-CM | POA: Diagnosis not present

## 2020-01-16 DIAGNOSIS — I700 Atherosclerosis of aorta: Secondary | ICD-10-CM | POA: Diagnosis not present

## 2020-01-16 DIAGNOSIS — I5043 Acute on chronic combined systolic (congestive) and diastolic (congestive) heart failure: Secondary | ICD-10-CM | POA: Diagnosis not present

## 2020-01-19 DIAGNOSIS — I420 Dilated cardiomyopathy: Secondary | ICD-10-CM | POA: Diagnosis not present

## 2020-01-19 DIAGNOSIS — I4819 Other persistent atrial fibrillation: Secondary | ICD-10-CM | POA: Diagnosis not present

## 2020-01-19 DIAGNOSIS — J449 Chronic obstructive pulmonary disease, unspecified: Secondary | ICD-10-CM | POA: Diagnosis not present

## 2020-01-19 DIAGNOSIS — I4892 Unspecified atrial flutter: Secondary | ICD-10-CM | POA: Diagnosis not present

## 2020-01-19 DIAGNOSIS — E119 Type 2 diabetes mellitus without complications: Secondary | ICD-10-CM | POA: Diagnosis not present

## 2020-01-19 DIAGNOSIS — I110 Hypertensive heart disease with heart failure: Secondary | ICD-10-CM | POA: Diagnosis not present

## 2020-01-19 DIAGNOSIS — I5043 Acute on chronic combined systolic (congestive) and diastolic (congestive) heart failure: Secondary | ICD-10-CM | POA: Diagnosis not present

## 2020-01-19 DIAGNOSIS — L03116 Cellulitis of left lower limb: Secondary | ICD-10-CM | POA: Diagnosis not present

## 2020-01-19 DIAGNOSIS — I700 Atherosclerosis of aorta: Secondary | ICD-10-CM | POA: Diagnosis not present

## 2020-01-21 DIAGNOSIS — I5043 Acute on chronic combined systolic (congestive) and diastolic (congestive) heart failure: Secondary | ICD-10-CM | POA: Diagnosis not present

## 2020-01-21 DIAGNOSIS — E119 Type 2 diabetes mellitus without complications: Secondary | ICD-10-CM | POA: Diagnosis not present

## 2020-01-21 DIAGNOSIS — J449 Chronic obstructive pulmonary disease, unspecified: Secondary | ICD-10-CM | POA: Diagnosis not present

## 2020-01-21 DIAGNOSIS — L03116 Cellulitis of left lower limb: Secondary | ICD-10-CM | POA: Diagnosis not present

## 2020-01-21 DIAGNOSIS — I700 Atherosclerosis of aorta: Secondary | ICD-10-CM | POA: Diagnosis not present

## 2020-01-21 DIAGNOSIS — I420 Dilated cardiomyopathy: Secondary | ICD-10-CM | POA: Diagnosis not present

## 2020-01-21 DIAGNOSIS — I110 Hypertensive heart disease with heart failure: Secondary | ICD-10-CM | POA: Diagnosis not present

## 2020-01-21 DIAGNOSIS — I4892 Unspecified atrial flutter: Secondary | ICD-10-CM | POA: Diagnosis not present

## 2020-01-21 DIAGNOSIS — I4819 Other persistent atrial fibrillation: Secondary | ICD-10-CM | POA: Diagnosis not present

## 2020-01-26 DIAGNOSIS — L03116 Cellulitis of left lower limb: Secondary | ICD-10-CM | POA: Diagnosis not present

## 2020-01-26 DIAGNOSIS — I480 Paroxysmal atrial fibrillation: Secondary | ICD-10-CM | POA: Diagnosis not present

## 2020-01-26 DIAGNOSIS — I420 Dilated cardiomyopathy: Secondary | ICD-10-CM | POA: Diagnosis not present

## 2020-01-26 DIAGNOSIS — I700 Atherosclerosis of aorta: Secondary | ICD-10-CM | POA: Diagnosis not present

## 2020-01-26 DIAGNOSIS — E119 Type 2 diabetes mellitus without complications: Secondary | ICD-10-CM | POA: Diagnosis not present

## 2020-01-26 DIAGNOSIS — I4892 Unspecified atrial flutter: Secondary | ICD-10-CM | POA: Diagnosis not present

## 2020-01-26 DIAGNOSIS — I4819 Other persistent atrial fibrillation: Secondary | ICD-10-CM | POA: Diagnosis not present

## 2020-01-26 DIAGNOSIS — J449 Chronic obstructive pulmonary disease, unspecified: Secondary | ICD-10-CM | POA: Diagnosis not present

## 2020-01-26 DIAGNOSIS — I5043 Acute on chronic combined systolic (congestive) and diastolic (congestive) heart failure: Secondary | ICD-10-CM | POA: Diagnosis not present

## 2020-01-26 DIAGNOSIS — Z7901 Long term (current) use of anticoagulants: Secondary | ICD-10-CM | POA: Diagnosis not present

## 2020-01-26 DIAGNOSIS — I110 Hypertensive heart disease with heart failure: Secondary | ICD-10-CM | POA: Diagnosis not present

## 2020-01-26 NOTE — Progress Notes (Signed)
Cardiology Office Note   Date:  01/27/2020   ID:  Ruben Murray, DOB [birth year], MRN [id number]  PCP:  [name], MD  Cardiologist:  Dr. [name], MD   Chief Complaint  Patient presents with   Hospitalization Follow-up    History of Present Illness: Ruben Murray is a 74 y.o. male who presents for follow-up, seen for Dr. [name].  Ruben Murray has a history of systolic CHF with unclear etiology with last echocardiogram in 2020 showing an LVEF at 40 to 45% with patient reports of multiple MIs however no LHC for review.  Also history of HTN, OSA, seizure disorder, aortic atherosclerosis, COPD, HTN and known atrial fibrillation on chronic Coumadin.  He was then seen 11/04/2019 in hospital consultation for evaluation of atrial flutter. He was initially admitted for left lower leg cellulitis managed by internal medicine.  Patient reported he was at his home after being treated with monoclonal antibodies for COVID-19 when he began having weakness with collapse without LOC at which time EMS was called and he was found to be in wide-complex tachycardia.  He was given diltiazem with HR slowing to the 130s which was then identified as atrial flutter. Troponins were elevated at 539 with a repeat at 3943 with no interval symptoms.  He was started on IV heparin with plans for DCCV.  Repeat echocardiogram unfortunately showed an LVEF of 20 to 25% with mildly dilated LA and moderately dilated RA with no valvular disease. Given drop in EF plan was made for right and left cardiac catheterization with plans to start ARB or possibly ARNI in the outpatient setting.   LHC from 11/06/2019 showed normal coronary arteries with mild pulmonary hypertension with a PA pressure of 34/22 with a mean PA pressure 28 mmHg.  Findings were consistent with dilated nonischemic cardiomyopathy. He was then started on GDMT with Entresto, digoxin, lasix, Toprol.   Unfortunately, he was discharged 11/06/2019 and presented back  to the ED on 12/09/2019 with persistent cellulitis, admitted for IV antibiotic therapy and was seen once again by cardiology for acute systolic CHF where he was treated with IV lasix that was transitioned to PO dosing 12/11/19. He has since followed with the outpatient foot clinic. It appears he has had several appointments for which she has no showed since hospital discharge.   Today he is here with his wife and there is much confusion regarding his medications.  I have compared his medication list with his personal list from his wallet.  He reports he has not been taking his Entresto and that his PCP has taken him off of this.  He follows with an [facility] PCP therefore I am unable to see recent lab work which he states was performed several weeks ago.  We discussed that typically the top reasons for discontinuation are low BP or high kidney function.  It appears his lab work has been stable in the past.  BP is stable today.  There is also question regarding Eliquis and Coumadin both being on his medication list.  He states he is not taking Eliquis and he is only taking Coumadin and follows his INR levels at his PCPs office which was recently elevated.  He goes back for INR check on Monday.  He has had no bleeding.  Overall from a CV standpoint he seems to be doing very well despite confusion of his medications.  He has had no shortness of breath, PND, orthopnea, LE edema, palpitations or dizziness.  Plan is to draw lab work today to assess renal function and if stable, will resend [name] prescription to pharmacy for restart.  We also discussed rechecking an echocardiogram at next OV.  Past Medical History:  Diagnosis Date   Acquired thrombophilia ([place]) 01/15/2019   Aortic atherosclerosis ([place])    Atrial fibrillation with RVR ([place]) 10/29/2017   CAP (community acquired pneumonia) 03/06/2016   Cardiomyopathy ([place])    a. EF 40-45% in 04/2016, etiology not defined, managed medically.   CHF exacerbation  ([place]) 10/29/2017   Chronic laryngitis 10/03/2016   COPD GOLD II  08/02/2016   Quit smoking 2000  PFT's  07/10/2016  FEV1 1.98 (70 % ) ratio 67  p 19 % improvement from saba p nothing  prior to study while of coreg so rec as of 08/02/2016 try off coreg and on bisoprolol      Dysphonia 10/03/2016   Essential hypertension 03/06/2016   Changed from coreg to bisoprolol due to copd with reversible component  08/02/2016 >>>    History of cardioversion    Hoarseness 08/31/2016   Referred to ent 08/31/2016 >>> seen 10/03/16 [name] dx gerd and rhinitis medicamentosa   > improved on f/u 01/31/17 on gerd rx/ flonase and off afrin   Hyperlipidemia    Hypertension    Laryngopharyngeal reflux (LPR) 10/03/2016   Moderate persistent asthma 08/02/2016   Morbid (severe) obesity due to excess calories ([place]) 08/02/2016   Myocardial infarction ([place]) 1980-early 2000s X 2   "mild ones" (03/06/2016)   Nasal polyps 10/03/2016   OSA on CPAP    a. pt states was told by Dr. Maxwell Caul that he would not require CPAP as long as he continued to sleep in recliner which he does for his back.   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   Rhinitis medicamentosa 10/03/2016   Right thyroid nodule 03/06/2016   Seizures ([place])    "take RX daily" (03/06/2016)   Sensorineural hearing loss (SNHL) of both ears 01/31/2017    Past Surgical History:  Procedure Laterality Date   CARDIOVERSION N/A 12/13/2017   Procedure: CARDIOVERSION;  Surgeon: [name], MD;  Location: [place] [facility];  Service: Cardiovascular;  Laterality: N/A;   CARDIOVERSION N/A 09/25/2018   Procedure: CARDIOVERSION;  Surgeon: [name], MD;  Location: [facility] ENDOSCOPY;  Service: Cardiovascular;  Laterality: N/A;   CARDIOVERSION N/A 11/04/2018   Procedure: CARDIOVERSION;  Surgeon: [name], MD;  Location: [facility] ENDOSCOPY;   Service: Cardiovascular;  Laterality: N/A;   CARDIOVERSION N/A 04/11/2019   Procedure: CARDIOVERSION;  Surgeon: [name], MD;  Location: [facility] ENDOSCOPY;  Service: Cardiovascular;  Laterality: N/A;   CIRCUMCISION     JOINT REPLACEMENT     PERCUTANEOUS PINNING TOE FRACTURE Left    "big toe   REPLACEMENT TOTAL KNEE Left    RIGHT/LEFT HEART CATH AND CORONARY ANGIOGRAPHY N/A 11/06/2019   Procedure: RIGHT/LEFT HEART CATH AND CORONARY ANGIOGRAPHY;  Surgeon: [name], MD;  Location: [facility];  Service: Cardiovascular;  Laterality: N/A;   TONSILLECTOMY AND ADENOIDECTOMY       Current Outpatient Medications  Medication Sig Dispense Refill   acetaminophen (TYLENOL) 500 MG tablet Take 500-1,000 mg by mouth daily as needed for moderate pain.      amiodarone (PACERONE) 200 MG tablet Take 1 tablet (200 mg total) by  mouth daily. 90 tablet 1   apixaban (ELIQUIS) 5 MG TABS tablet Eliquis 5 mg tablet  TAKE 1 TABLET BY MOUTH TWICE DAILY     ARTIFICIAL TEAR SOLUTION OP Place 1 drop into both eyes 2 (two) times daily.     benzonatate (TESSALON) 200 MG capsule Take 200 mg by mouth 3 (three) times daily as needed.     cephALEXin (KEFLEX) 500 MG capsule cephalexin 500 mg capsule  TAKE 1 CAPSULE BY MOUTH 4 TIMES DAILY FOR 7 DAYS     ciclopirox (LOPROX) 0.77 % cream ciclopirox 0.77 % topical cream  APPLY TOPICALLY TWICE DAILY FOR 30 DAYS     digoxin (LANOXIN) 0.125 MG tablet Take 1 tablet (0.125 mg total) by mouth daily. 90 tablet 0   doxycycline (VIBRAMYCIN) 100 MG capsule Take 100 mg by mouth 2 (two) times daily.     famotidine (PEPCID) 20 MG tablet One at bedtime (Patient taking differently: Take 20 mg by mouth at bedtime. One at bedtime) 30 tablet 2   finasteride (PROSCAR) 5 MG tablet Take 5 mg by mouth daily at 12 noon.      fluticasone (FLONASE) 50 MCG/ACT nasal spray Place 2 sprays into both nostrils 2 (two) times daily.     furosemide (LASIX) 40 MG tablet Take 1  tablet (40 mg total) by mouth daily. 90 tablet 0   methylPREDNISolone (MEDROL DOSEPAK) 4 MG TBPK tablet 6 day dose pack - take as directed 21 tablet 0   metoprolol succinate (TOPROL-XL) 100 MG 24 hr tablet Take 1 tablet (100 mg total) by mouth daily. Take with or immediately following a meal. 90 tablet 0   Multiple Vitamin (MULTIVITAMIN WITH MINERALS) TABS tablet Take 1 tablet by mouth daily at 12 noon.     PHENobarbital (LUMINAL) 97.2 MG tablet Take 97.2 mg by mouth at bedtime.      polyvinyl alcohol (ARTIFICIAL TEARS) 1.4 % ophthalmic solution Place 1 drop into both eyes daily with lunch.     potassium chloride (KLOR-CON) 10 MEQ tablet potassium chloride ER 10 mEq tablet,extended release(part/cryst)  TAKE 1 TABLET BY MOUTH ONCE DAILY     pravastatin (PRAVACHOL) 40 MG tablet Take 40 mg by mouth at bedtime.      sacubitril-valsartan (ENTRESTO) 24-26 MG Take 1 tablet by mouth 2 (two) times daily. 60 tablet 2   SHINGRIX injection      warfarin (COUMADIN) 3 MG tablet Take 3 mg by mouth See admin instructions. Mon,Tues,Wed,Thurs,Fri,Sat only     No current facility-administered medications for this visit.    Allergies:   Dilaudid [hydromorphone hcl], Dilaudid [hydromorphone], Tegretol [carbamazepine], and Tegretol [carbamazepine]    Social History:  The patient  reports that he quit smoking about 21 years ago. His smoking use included cigarettes. He has a 144.00 pack-year smoking history. He has never used smokeless tobacco. He reports that he does not drink alcohol and does not use drugs.   Family History:  The patient's family history includes Hypertension in his mother; Other in his father.    ROS:  Please see the history of present illness.   Otherwise, review of systems are positive for none.  All other systems are reviewed and negative.    PHYSICAL EXAM: VS:  BP 118/72    Pulse 72    Ht _0  (1.727 m)    Wt 251 lb (113.9 kg)    SpO2 96%    BMI 38.16 kg/m  , BMI Body mass  index is 38.16 kg/m.  General: Well developed, well nourished, NAD Neck: Negative for carotid bruits. No JVD Lungs:Clear to ausculation bilaterally. No wheezes, rales, or rhonchi. Breathing is unlabored. Cardiovascular: RRR with S1 S2. No murmurs Extremities: No edema. Radial pulses 2+ bilaterally Neuro: Alert and oriented. No focal deficits. No facial asymmetry. MAE spontaneously. Psych: Responds to questions appropriately with normal affect.    EKG:  EKG is not ordered today.  Recent Labs: 12/09/2019: B Natriuretic Peptide 80.6 12/10/2019: ALT 21; Magnesium 1.9; TSH 1.140 12/12/2019: BUN 20; Creatinine, Ser 0.83; Hemoglobin 14.5; Platelets 227; Potassium 4.0; Sodium 138   Lipid Panel    Component Value Date/Time   CHOL 114 11/04/2019 0214   TRIG 60 11/04/2019 0214   HDL 34 (L) 11/04/2019 0214   CHOLHDL 3.4 11/04/2019 0214   VLDL 12 11/04/2019 0214   LDLCALC 68 11/04/2019 0214    Wt Readings from Last 3 Encounters:  01/27/20 251 lb (113.9 kg)  12/11/19 260 lb 2.3 oz (118 kg)  11/06/19 256 lb 4.8 oz (116.3 kg)    Other studies Reviewed: Additional studies/ records that were reviewed today include: Review of the above records demonstrates:  Echocardiogram 11/04/2019:  1. Left ventricular ejection fraction, by estimation, is 20 to 25%. The  left ventricle has severely decreased function. The left ventricle  demonstrates global hypokinesis. The left ventricular internal cavity size  was moderately dilated. Left  ventricular diastolic parameters are indeterminate.  2. Right ventricular systolic function is moderately reduced. The right  ventricular size is moderately enlarged. There is mildly elevated  pulmonary artery systolic pressure. The estimated right ventricular  systolic pressure is 26.3 mmHg.  3. Left atrial size was mildly dilated.  4. Right atrial size was moderately dilated.  5. The mitral valve is normal in structure. Mild mitral valve  regurgitation. No  evidence of mitral stenosis.  6. The aortic valve is tricuspid. Aortic valve regurgitation is mild. No  aortic stenosis is present.  7. The inferior vena cava is dilated in size with <50% respiratory  variability, suggesting right atrial pressure of 15 mmHg.    Right and left cardiac catheterization 11/06/2019:  Normal coronary arteries with a codominant system.  Mild right heart pressure elevation with mild pulmonary hypertension; PA 34/22, mean PA pressure 28 mm.  RECOMMENDATION:  Guideline directed medical therapy for nonischemic cardiomyopathy.  In this patient with recent Covid 19 positivity consider post Covid myocarditis.  ASSESSMENT AND PLAN:  1. Chronic systolic CHF: -Last echocardiogram 11/04/2019 with LVEF at 20 to 25% -Right and left cardiac catheterization with no coronary disease -Appears euvolemic on exam -Continue on Lasix 40 mg daily  2.  Cellulitis: -Treated with abx >> resolved -Followed with outpatient for clinic  3.  Persistent atrial flutter s/p multiple cardioversions: -Multiple DCCV in the past, most recent 04/2019 -Maintained on metoprolol, digoxin and amiodarone -Anticoagulated with Coumadin, INRs followed by PCP -No signs of bleeding in stool or urine  4.  Nonischemic cardiomyopathy: -Most recent echocardiogram/cardiac catheterization with an EF at 25% and normal coronary arteries -Guideline directed medical therapy with Entresto initiated however there is confusion regarding his medications.  He felt that his PCP had stopped his [name] however in looking over his personal medication list he may have just been out of the medication.  We will check lab work today to ensure stable renal function.  BP is stable today.  If lab work okay, will restart low-dose Entresto.    6.  HTN: -Stable>> will ensure kidney function stable and plan  to restart low dose Entresto  -Continue metoprolol   Current medicines are reviewed at length with the patient today.   The patient does not have concerns regarding medicines.  The following changes have been made:  no change>>may add Entresto 24/26   Labs/ tests ordered today include: BMET   Orders Placed This Encounter  Procedures   Basic metabolic panel    Disposition:   FU with APP in 6 weeks  Signed, Kathyrn Drown, NP  01/27/2020 5:16 PM    [facility] [address], [place], [place]  [address] Phone: [phone]; Fax: [phone]

## 2020-01-27 ENCOUNTER — Encounter: Payer: Self-pay | Admitting: Cardiology

## 2020-01-27 ENCOUNTER — Ambulatory Visit (INDEPENDENT_AMBULATORY_CARE_PROVIDER_SITE_OTHER): Payer: Medicare HMO | Admitting: Cardiology

## 2020-01-27 ENCOUNTER — Other Ambulatory Visit: Payer: Self-pay

## 2020-01-27 VITALS — BP 118/72 | HR 72 | Ht 68.0 in | Wt 251.0 lb

## 2020-01-27 DIAGNOSIS — I420 Dilated cardiomyopathy: Secondary | ICD-10-CM | POA: Diagnosis not present

## 2020-01-27 DIAGNOSIS — Z79899 Other long term (current) drug therapy: Secondary | ICD-10-CM | POA: Diagnosis not present

## 2020-01-27 DIAGNOSIS — I10 Essential (primary) hypertension: Secondary | ICD-10-CM | POA: Diagnosis not present

## 2020-01-27 DIAGNOSIS — I5022 Chronic systolic (congestive) heart failure: Secondary | ICD-10-CM

## 2020-01-27 DIAGNOSIS — I5043 Acute on chronic combined systolic (congestive) and diastolic (congestive) heart failure: Secondary | ICD-10-CM

## 2020-01-27 DIAGNOSIS — E7849 Other hyperlipidemia: Secondary | ICD-10-CM

## 2020-01-27 DIAGNOSIS — I484 Atypical atrial flutter: Secondary | ICD-10-CM

## 2020-01-27 NOTE — Patient Instructions (Addendum)
Medication Instructions:  Your physician recommends that you continue on your current medications as directed. Please refer to the Current Medication list given to you today.  *If you need a refill on your cardiac medications before your next appointment, please call your pharmacy*   Lab Work: TODAY: BMET If you have labs (blood work) drawn today and your tests are completely normal, you will receive your results only by: Marland Kitchen MyChart Message (if you have MyChart) OR . A paper copy in the mail If you have any lab test that is abnormal or we need to change your treatment, we will call you to review the results.   Testing/Procedures: NONE  Follow-Up: At [facility], you and your health needs are our priority.  As part of our continuing mission to provide you with exceptional heart care, we have created designated Provider Care Teams.  These Care Teams include your primary Cardiologist (physician) and Advanced Practice Providers (APPs -  Physician Assistants and Nurse Practitioners) who all work together to provide you with the care you need, when you need it.  We recommend signing up for the patient portal called "MyChart".  Sign up information is provided on this After Visit Summary.  MyChart is used to connect with patients for Virtual Visits (Telemedicine).  Patients are able to view lab/test results, encounter notes, upcoming appointments, etc.  Non-urgent messages can be sent to your provider as well.   To learn more about what you can do with MyChart, go to [email or web address].    Your next appointment:   8 week(s)  The format for your next appointment:   In Person  Provider:   You may see [name], MD or one of the following Advanced Practice Providers on your designated Care Team:    Truitt Merle, NP  Cecilie Kicks, NP  Kathyrn Drown, NP

## 2020-01-28 ENCOUNTER — Telehealth: Payer: Self-pay | Admitting: Podiatry

## 2020-01-28 DIAGNOSIS — E119 Type 2 diabetes mellitus without complications: Secondary | ICD-10-CM | POA: Diagnosis not present

## 2020-01-28 DIAGNOSIS — I5043 Acute on chronic combined systolic (congestive) and diastolic (congestive) heart failure: Secondary | ICD-10-CM | POA: Diagnosis not present

## 2020-01-28 DIAGNOSIS — L03116 Cellulitis of left lower limb: Secondary | ICD-10-CM | POA: Diagnosis not present

## 2020-01-28 DIAGNOSIS — I4892 Unspecified atrial flutter: Secondary | ICD-10-CM | POA: Diagnosis not present

## 2020-01-28 DIAGNOSIS — I110 Hypertensive heart disease with heart failure: Secondary | ICD-10-CM | POA: Diagnosis not present

## 2020-01-28 DIAGNOSIS — J449 Chronic obstructive pulmonary disease, unspecified: Secondary | ICD-10-CM | POA: Diagnosis not present

## 2020-01-28 DIAGNOSIS — I4819 Other persistent atrial fibrillation: Secondary | ICD-10-CM | POA: Diagnosis not present

## 2020-01-28 DIAGNOSIS — I420 Dilated cardiomyopathy: Secondary | ICD-10-CM | POA: Diagnosis not present

## 2020-01-28 DIAGNOSIS — I700 Atherosclerosis of aorta: Secondary | ICD-10-CM | POA: Diagnosis not present

## 2020-01-28 LAB — BASIC METABOLIC PANEL
BUN/Creatinine Ratio: 15 (ref 10–24)
BUN: 15 mg/dL (ref 8–27)
CO2: 29 mmol/L (ref 20–29)
Calcium: 9 mg/dL (ref 8.6–10.2)
Chloride: 101 mmol/L (ref 96–106)
Creatinine, Ser: 0.99 mg/dL (ref 0.76–1.27)
GFR calc Af Amer: 85 mL/min/{1.73_m2} (ref >59)
GFR calc non Af Amer: 74 mL/min/{1.73_m2} (ref >59)
Glucose: 97 mg/dL (ref 65–99)
Potassium: 4.4 mmol/L (ref 3.5–5.2)
Sodium: 143 mmol/L (ref 134–144)

## 2020-01-28 MED ORDER — METHYLPREDNISOLONE 4 MG PO TBPK
ORAL_TABLET | ORAL | 0 refills | Status: DC
Start: 2020-01-28 — End: 2021-04-22

## 2020-01-28 NOTE — Telephone Encounter (Signed)
Patient has requested pain med to hold off pain until they can be seen next Friday 02/06/20. They asked for refill on last meds assuming it helped with pain but I checked chart and the prescription was methylPREDNISolone,  Please Advise

## 2020-01-28 NOTE — Addendum Note (Signed)
Addended bySherryle Lis, Nelda Luckey R on: 01/28/2020 10:29 AM   Modules accepted: Orders

## 2020-01-28 NOTE — Telephone Encounter (Signed)
That would be fine, I'll send a refill, it's likely a gout flare. Thanks

## 2020-02-03 ENCOUNTER — Other Ambulatory Visit: Payer: Self-pay

## 2020-02-03 DIAGNOSIS — Z7901 Long term (current) use of anticoagulants: Secondary | ICD-10-CM | POA: Diagnosis not present

## 2020-02-03 DIAGNOSIS — I480 Paroxysmal atrial fibrillation: Secondary | ICD-10-CM | POA: Diagnosis not present

## 2020-02-03 DIAGNOSIS — I4819 Other persistent atrial fibrillation: Secondary | ICD-10-CM | POA: Diagnosis not present

## 2020-02-03 DIAGNOSIS — J449 Chronic obstructive pulmonary disease, unspecified: Secondary | ICD-10-CM | POA: Diagnosis not present

## 2020-02-03 DIAGNOSIS — I700 Atherosclerosis of aorta: Secondary | ICD-10-CM | POA: Diagnosis not present

## 2020-02-03 DIAGNOSIS — I5043 Acute on chronic combined systolic (congestive) and diastolic (congestive) heart failure: Secondary | ICD-10-CM | POA: Diagnosis not present

## 2020-02-03 DIAGNOSIS — I4892 Unspecified atrial flutter: Secondary | ICD-10-CM | POA: Diagnosis not present

## 2020-02-03 DIAGNOSIS — L03116 Cellulitis of left lower limb: Secondary | ICD-10-CM | POA: Diagnosis not present

## 2020-02-03 DIAGNOSIS — I110 Hypertensive heart disease with heart failure: Secondary | ICD-10-CM | POA: Diagnosis not present

## 2020-02-03 DIAGNOSIS — E119 Type 2 diabetes mellitus without complications: Secondary | ICD-10-CM | POA: Diagnosis not present

## 2020-02-03 DIAGNOSIS — I420 Dilated cardiomyopathy: Secondary | ICD-10-CM | POA: Diagnosis not present

## 2020-02-03 NOTE — Patient Outreach (Signed)
[facility]) Care Management  02/03/2020  Ruben Murray January 21, 1946 [id number]   Telephone call to patient for disease management follow up.   No answer.  Unable to leave a voice message.  Plan: If no return call, RN CM will attempt patient again within 4 business days.    [name], RN, MSN [place] Management Care Management Coordinator Direct Line [phone] Cell [phone] Toll Free: [phone]  Fax: [phone]

## 2020-02-06 ENCOUNTER — Other Ambulatory Visit: Payer: Self-pay

## 2020-02-06 ENCOUNTER — Encounter: Payer: Self-pay | Admitting: Podiatry

## 2020-02-06 ENCOUNTER — Ambulatory Visit: Payer: Medicare HMO | Admitting: Podiatry

## 2020-02-06 ENCOUNTER — Ambulatory Visit (INDEPENDENT_AMBULATORY_CARE_PROVIDER_SITE_OTHER): Payer: Medicare HMO

## 2020-02-06 ENCOUNTER — Ambulatory Visit: Payer: Self-pay

## 2020-02-06 DIAGNOSIS — R600 Localized edema: Secondary | ICD-10-CM

## 2020-02-06 DIAGNOSIS — M109 Gout, unspecified: Secondary | ICD-10-CM

## 2020-02-06 DIAGNOSIS — M779 Enthesopathy, unspecified: Secondary | ICD-10-CM | POA: Diagnosis not present

## 2020-02-06 NOTE — Patient Instructions (Signed)

## 2020-02-06 NOTE — Patient Outreach (Signed)
[facility]) Care Management  02/06/2020  Ruben Murray April 28, 1945 [id number]   Telephone call to patient for disease management follow up.   No answer.  HIPAA compliant voice message left.    Plan: If no return call, RN CM will attempt patient again with 4 business days and send letter.  [name], RN, MSN [place] Management Care Management Coordinator Direct Line [phone] Cell [phone] Toll Free: [phone]  Fax: [phone]

## 2020-02-09 NOTE — Progress Notes (Signed)
Subjective:   Patient ID: Ruben Murray, male   DOB: 74 y.o.   MRN: [id number]   HPI Patient states he is developed pain on top of his left foot and it is gotten better recently but he still wants to check it.  States he also has swelling   ROS      Objective:  Physical Exam  Neurovascular status unchanged with moderate edema in the dorsum of the left foot that is currently not hurting him but it was in the past.  Patient gets these attacks which occur periodically and states that he does have moderate chronic swelling of his left foot      Assessment:  Inflammatory changes with the possibility that part of this may be due to chronic gout versus lymphatic or venous swelling     Plan:  H&P reviewed condition and recommended correction with ankle compression stocking dispensed.  I then went ahead discussed the possibilities for other treatment that could be done with this and I discussed and explained to him gout and gout diet and we will start him with instructions today on food he can be careful with.  Patient does understand that there is no guarantees about this condition and that eventually he may need to be on medicine  X-rays were negative for signs there was a fracture or advanced arthritis associated with condition

## 2020-02-10 ENCOUNTER — Other Ambulatory Visit: Payer: Self-pay

## 2020-02-10 DIAGNOSIS — I480 Paroxysmal atrial fibrillation: Secondary | ICD-10-CM | POA: Diagnosis not present

## 2020-02-10 DIAGNOSIS — Z7901 Long term (current) use of anticoagulants: Secondary | ICD-10-CM | POA: Diagnosis not present

## 2020-02-10 NOTE — Patient Outreach (Signed)
[facility]) Care Management  02/10/2020  Ruben Murray May 10, 1945 [id number]   Telephone call to patient for disease management follow up.   No answer.  Unable to leave a message.   Plan: If no return call, RN CM will attempt patient again in the month of December.  [name], RN, MSN [place] Management Care Management Coordinator Direct Line [phone] Cell [phone] Toll Free: [phone]  Fax: [phone]

## 2020-02-20 DIAGNOSIS — Z7901 Long term (current) use of anticoagulants: Secondary | ICD-10-CM | POA: Diagnosis not present

## 2020-02-20 DIAGNOSIS — I480 Paroxysmal atrial fibrillation: Secondary | ICD-10-CM | POA: Diagnosis not present

## 2020-02-25 ENCOUNTER — Other Ambulatory Visit: Payer: Self-pay

## 2020-02-25 NOTE — Patient Instructions (Signed)
Goals Addressed            This Visit's Progress   . Make and Keep All Appointments   On track    Timeframe:  Long-Range Goal Priority:  High Start Date:   02/25/20                          Expected End Date: 04/05/20                     Follow Up Date 04/05/20   - call to cancel if needed - keep a calendar with appointment dates    Why is this important?   Part of staying healthy is seeing the doctor for follow-up care.  If you forget your appointments, there are some things you can do to stay on track.    Notes:     . Track and Manage Symptoms   On track    Timeframe:  Long-Range Goal Priority:  High Start Date:   02/25/20                          Expected End Date:   04/05/20                  Follow Up Date 04/05/20  - bring diary to all appointments - know when to call the doctor - track symptoms and what helps feel better or worse    Why is this important?   You will be able to handle your symptoms better if you keep track of them.  Making some simple changes to your lifestyle will help.  Eating healthy is one thing you can do to take good care of yourself.    Notes: Patient managing heart failure well.  Patient weighing daily.

## 2020-02-25 NOTE — Patient Outreach (Signed)
[facility]) Care Management  [place]  02/25/2020   Ruben Murray 11-Aug-1945 [id number]  Subjective: Telephone call to patient for follow up. Patient reports he is doing pretty good now.  He states he is over his gout flares.  Patient continuing to manage other chronic conditions. He reports that his weight is good at 236 lbs.  He weighs daily and monitors salt intake.  Discussed continued heart failure management and the importance.  He verbalized understanding.     Objective:   Encounter Medications:  Outpatient Encounter Medications as of 02/25/2020  Medication Sig  . acetaminophen (TYLENOL) 500 MG tablet Take 500-1,000 mg by mouth daily as needed for moderate pain.   Marland Kitchen amiodarone (PACERONE) 200 MG tablet Take 1 tablet (200 mg total) by mouth daily.  Marland Kitchen apixaban (ELIQUIS) 5 MG TABS tablet Eliquis 5 mg tablet  TAKE 1 TABLET BY MOUTH TWICE DAILY  . ARTIFICIAL TEAR SOLUTION OP Place 1 drop into both eyes 2 (two) times daily.  . benzonatate (TESSALON) 200 MG capsule Take 200 mg by mouth 3 (three) times daily as needed.  . cephALEXin (KEFLEX) 500 MG capsule cephalexin 500 mg capsule  TAKE 1 CAPSULE BY MOUTH 4 TIMES DAILY FOR 7 DAYS  . ciclopirox (LOPROX) 0.77 % cream ciclopirox 0.77 % topical cream  APPLY TOPICALLY TWICE DAILY FOR 30 DAYS  . digoxin (LANOXIN) 0.125 MG tablet Take 1 tablet (0.125 mg total) by mouth daily.  Marland Kitchen doxycycline (VIBRAMYCIN) 100 MG capsule Take 100 mg by mouth 2 (two) times daily.  . famotidine (PEPCID) 20 MG tablet One at bedtime (Patient taking differently: Take 20 mg by mouth at bedtime. One at bedtime)  . finasteride (PROSCAR) 5 MG tablet Take 5 mg by mouth daily at 12 noon.   . fluticasone (FLONASE) 50 MCG/ACT nasal spray Place 2 sprays into both nostrils 2 (two) times daily.  . furosemide (LASIX) 40 MG tablet Take 1 tablet (40 mg total) by mouth daily.  . methylPREDNISolone (MEDROL DOSEPAK) 4 MG TBPK tablet 6 day dose pack -  take as directed  . metoprolol succinate (TOPROL-XL) 100 MG 24 hr tablet Take 1 tablet (100 mg total) by mouth daily. Take with or immediately following a meal.  . Multiple Vitamin (MULTIVITAMIN WITH MINERALS) TABS tablet Take 1 tablet by mouth daily at 12 noon.  Marland Kitchen PHENobarbital (LUMINAL) 97.2 MG tablet Take 97.2 mg by mouth at bedtime.   . polyvinyl alcohol (LIQUIFILM TEARS) 1.4 % ophthalmic solution Place 1 drop into both eyes daily with lunch.  . potassium chloride (KLOR-CON) 10 MEQ tablet potassium chloride ER 10 mEq tablet,extended release(part/cryst)  TAKE 1 TABLET BY MOUTH ONCE DAILY  . pravastatin (PRAVACHOL) 40 MG tablet Take 40 mg by mouth at bedtime.   . sacubitril-valsartan (ENTRESTO) 24-26 MG Take 1 tablet by mouth 2 (two) times daily.  Marland Kitchen SHINGRIX injection   . warfarin (COUMADIN) 3 MG tablet Take 3 mg by mouth See admin instructions. Mon,Tues,Wed,Thurs,Fri,Sat only   No facility-administered encounter medications on file as of 02/25/2020.    Functional Status:  In your present state of health, do you have any difficulty performing the following activities: 02/25/2020 12/15/2019  Hearing? Y Y  Comment trouble hearing at times trouble hearing at times  Vision? N N  Difficulty concentrating or making decisions? N N  Walking or climbing stairs? Y Y  Comment knee problems knee problems  Dressing or bathing? N N  Doing errands, shopping? N [employer]  and eating ? N N  Using the Toilet? N N  In the past six months, have you accidently leaked urine? N N  Do you have problems with loss of bowel control? N N  Managing your Medications? N N  Managing your Finances? N N  Housekeeping or managing your Housekeeping? N N  Some recent data might be hidden    Fall/Depression Screening: Fall Risk  02/25/2020 12/15/2019 11/11/2019  Falls in the past year? 1 0 0  Number falls in past yr: 0 0 -  Injury with Fall? 0 0 -  Risk for fall due to : History of fall(s) - -  Follow up  Falls prevention discussed - -   PHQ 2/9 Scores 02/25/2020 11/11/2019 06/12/2019 06/07/2018 02/18/2018 11/15/2017  PHQ - 2 Score 0 0 0 0 1 0    Assessment: Patient continues to manage chronic conditions and sees physicians as scheduled.   Goals Addressed            This Visit's Progress   . Make and Keep All Appointments   On track    Timeframe:  Long-Range Goal Priority:  High Start Date:   02/25/20                          Expected End Date: 04/05/20                     Follow Up Date 04/05/20   - call to cancel if needed - keep a calendar with appointment dates    Why is this important?   Part of staying healthy is seeing the doctor for follow-up care.  If you forget your appointments, there are some things you can do to stay on track.    Notes:     . Track and Manage Symptoms   On track    Timeframe:  Long-Range Goal Priority:  High Start Date:   02/25/20                          Expected End Date:   04/05/20                  Follow Up Date 04/05/20  - bring diary to all appointments - know when to call the doctor - track symptoms and what helps feel better or worse    Why is this important?   You will be able to handle your symptoms better if you keep track of them.  Making some simple changes to your lifestyle will help.  Eating healthy is one thing you can do to take good care of yourself.    Notes: Patient managing heart failure well.  Patient weighing daily.         Plan: RN CM will contact patient next month and patient agreeable.   RN CM will send update to physician.   Follow-up:  Patient agrees to Care Plan and Follow-up.   [name], RN, MSN [place] Management Care Management Coordinator Direct Line [phone] Cell [phone] Toll Free: [phone]  Fax: [phone]

## 2020-03-11 DIAGNOSIS — Z7901 Long term (current) use of anticoagulants: Secondary | ICD-10-CM | POA: Diagnosis not present

## 2020-03-11 DIAGNOSIS — I480 Paroxysmal atrial fibrillation: Secondary | ICD-10-CM | POA: Diagnosis not present

## 2020-03-16 ENCOUNTER — Other Ambulatory Visit: Payer: Self-pay

## 2020-03-16 ENCOUNTER — Encounter: Payer: Self-pay | Admitting: Cardiology

## 2020-03-16 ENCOUNTER — Ambulatory Visit (INDEPENDENT_AMBULATORY_CARE_PROVIDER_SITE_OTHER): Payer: Medicare HMO | Admitting: Cardiology

## 2020-03-16 VITALS — BP 110/60 | HR 99 | Ht 68.0 in | Wt 234.0 lb

## 2020-03-16 DIAGNOSIS — D6869 Other thrombophilia: Secondary | ICD-10-CM | POA: Diagnosis not present

## 2020-03-16 DIAGNOSIS — I10 Essential (primary) hypertension: Secondary | ICD-10-CM

## 2020-03-16 DIAGNOSIS — I5042 Chronic combined systolic (congestive) and diastolic (congestive) heart failure: Secondary | ICD-10-CM | POA: Diagnosis not present

## 2020-03-16 DIAGNOSIS — I5043 Acute on chronic combined systolic (congestive) and diastolic (congestive) heart failure: Secondary | ICD-10-CM

## 2020-03-16 DIAGNOSIS — J449 Chronic obstructive pulmonary disease, unspecified: Secondary | ICD-10-CM | POA: Diagnosis not present

## 2020-03-16 NOTE — Progress Notes (Signed)
Cardiology Office Note:    Date:  03/16/2020   ID:  [name], DOB 08/13/1945, MRN [id number]  PCP:  Ruben Macadam, MD  Cardiologist:  Ruben Furbish, MD  Electrophysiologist:  Ruben Haw, MD   Referring MD: Ruben Macadam, MD     History of Present Illness:    Ruben Murray is a 75 y.o. male with persistent atrial fibrillation here for follow-up prior to cardioversion.  Had repeat cardioversion 11/04/2018 after being loaded on amiodarone.  On warfarin for anticoagulation.  No bleeding.  Still feeling short of breath weak fatigue.  After cardioversions briefly he felt well.  Trying to restore sinus rhythm.  He is also seen Dr. Curt Murray with electrophysiology.  Seen Ruben Murray as well.  Has cardioversion this Friday.  Getting Covid test.  Lives in a 1 bedroom apartment and is short of breath with minimal activity.  Quit smoking 18 years ago.  Today comes in for atrial fibrillation follow-up.  Dilated cardiomyopathy.  Doing well.  No recent symptoms.  See below for details.  Past Medical History:  Diagnosis Date  . Acquired thrombophilia ([facility]) 01/15/2019  . Aortic atherosclerosis ([place])   . Atrial fibrillation with RVR ([place]) 10/29/2017  . CAP (community acquired pneumonia) 03/06/2016  . Cardiomyopathy ([place])    a. EF 40-45% in 04/2016, etiology not defined, managed medically.  . CHF exacerbation ([place]) 10/29/2017  . Chronic laryngitis 10/03/2016  . COPD GOLD II  08/02/2016   Quit smoking 2000  PFT's  07/10/2016  FEV1 1.98 (70 % ) ratio 67  p 19 % improvement from saba p nothing  prior to study while of coreg so rec as of 08/02/2016 try off coreg and on bisoprolol     . Dysphonia 10/03/2016  . Essential hypertension 03/06/2016   Changed from coreg to bisoprolol due to copd with reversible component  08/02/2016 >>>   . History of cardioversion   . Hoarseness 08/31/2016   Referred to ent 08/31/2016 >>> seen 10/03/16 Ruben Murray dx gerd and rhinitis medicamentosa   > improved on f/u 01/31/17 on  gerd rx/ flonase and off afrin  . Hyperlipidemia   . Hypertension   . Laryngopharyngeal reflux (LPR) 10/03/2016  . Moderate persistent asthma 08/02/2016  . Morbid (severe) obesity due to excess calories ([place]) 08/02/2016  . Myocardial infarction [facility]) 1980-early 2000s X 2   "mild ones" (03/06/2016)  . Nasal polyps 10/03/2016  . OSA on CPAP    a. pt states was told by Dr. Maxwell Caul that he would not require CPAP as long as he continued to sleep in recliner which he does for his back.  . Paroxysmal atrial fibrillation ([place])   . Pneumonia    "when I was a kid" (03/06/2016)  . Prolonged QT interval 10/29/2017  . RBBB   . 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 Rhinitis medicamentosa 10/03/2016  . Right thyroid nodule 03/06/2016  . Seizures ([place])    "take RX daily" (03/06/2016)  . Sensorineural hearing loss (SNHL) of both ears 01/31/2017    Past Surgical History:  Procedure Laterality Date  . CARDIOVERSION N/A 12/13/2017   Procedure: CARDIOVERSION;  Surgeon: [name], MD;  Location: [facility] ENDOSCOPY;  Service: Cardiovascular;  Laterality: N/A;  . CARDIOVERSION N/A 09/25/2018   Procedure: CARDIOVERSION;  Surgeon: Ruben Casino, MD;  Location: [facility] ENDOSCOPY;  Service: Cardiovascular;  Laterality: N/A;  . CARDIOVERSION N/A 11/04/2018   Procedure: CARDIOVERSION;  Surgeon: Ruben Headings, MD;  Location: [place];  Service:  Cardiovascular;  Laterality: N/A;  . CARDIOVERSION N/A 04/11/2019   Procedure: CARDIOVERSION;  Surgeon: Ruben Perla, MD;  Location: [facility] ENDOSCOPY;  Service: Cardiovascular;  Laterality: N/A;  . CIRCUMCISION    . JOINT REPLACEMENT    . PERCUTANEOUS PINNING TOE FRACTURE Left    "big toe  . REPLACEMENT TOTAL KNEE Left   . RIGHT/LEFT HEART CATH AND CORONARY ANGIOGRAPHY N/A 11/06/2019   Procedure: RIGHT/LEFT HEART CATH AND CORONARY ANGIOGRAPHY;  Surgeon: Ruben Sine, MD;  Location: [place] CV LAB;  Service: Cardiovascular;  Laterality: N/A;  . TONSILLECTOMY AND  ADENOIDECTOMY      Current Medications: Current Meds  Medication Sig  . acetaminophen (TYLENOL) 500 MG tablet Take 500-1,000 mg by mouth daily as needed for moderate pain.   Ruben Murray amiodarone (PACERONE) 200 MG tablet Take 1 tablet (200 mg total) by mouth daily.  Ruben Murray apixaban (ELIQUIS) 5 MG TABS tablet Eliquis 5 mg tablet  TAKE 1 TABLET BY MOUTH TWICE DAILY  . ARTIFICIAL TEAR SOLUTION OP Place 1 drop into both eyes 2 (two) times daily.  . benzonatate (TESSALON) 200 MG capsule Take 200 mg by mouth 3 (three) times daily as needed.  . cephALEXin (KEFLEX) 500 MG capsule cephalexin 500 mg capsule  TAKE 1 CAPSULE BY MOUTH 4 TIMES DAILY FOR 7 DAYS  . ciclopirox (LOPROX) 0.77 % cream ciclopirox 0.77 % topical cream  APPLY TOPICALLY TWICE DAILY FOR 30 DAYS  . digoxin (LANOXIN) 0.125 MG tablet Take 1 tablet (0.125 mg total) by mouth daily.  Ruben Murray doxycycline (VIBRAMYCIN) 100 MG capsule Take 100 mg by mouth 2 (two) times daily.  . famotidine (PEPCID) 20 MG tablet One at bedtime  . finasteride (PROSCAR) 5 MG tablet Take 5 mg by mouth daily at 12 noon.   . fluticasone (FLONASE) 50 MCG/ACT nasal spray Place 2 sprays into both nostrils 2 (two) times daily.  . furosemide (LASIX) 40 MG tablet Take 1 tablet (40 mg total) by mouth daily.  . methylPREDNISolone (MEDROL DOSEPAK) 4 MG TBPK tablet 6 day dose pack - take as directed  . metoprolol succinate (TOPROL-XL) 100 MG 24 hr tablet Take 1 tablet (100 mg total) by mouth daily. Take with or immediately following a meal.  . Multiple Vitamin (MULTIVITAMIN WITH MINERALS) TABS tablet Take 1 tablet by mouth daily at 12 noon.  Ruben Murray PHENobarbital (LUMINAL) 97.2 MG tablet Take 97.2 mg by mouth at bedtime.   . polyvinyl alcohol (LIQUIFILM TEARS) 1.4 % ophthalmic solution Place 1 drop into both eyes daily with lunch.  . potassium chloride (KLOR-CON) 10 MEQ tablet potassium chloride ER 10 mEq tablet,extended release(part/cryst)  TAKE 1 TABLET BY MOUTH ONCE DAILY  . pravastatin  (PRAVACHOL) 40 MG tablet Take 40 mg by mouth at bedtime.   Ruben Murray SHINGRIX injection   . warfarin (COUMADIN) 3 MG tablet Take 3 mg by mouth See admin instructions. Mon,Tues,Wed,Thurs,Fri,Sat only     Allergies:   Dilaudid [hydromorphone hcl], Dilaudid [hydromorphone], Tegretol [carbamazepine], and Tegretol [carbamazepine]   Social History   Socioeconomic History  . Marital status: Married    Spouse name: Not on file  . Number of children: Not on file  . Years of education: Not on file  . Highest education level: Not on file  Occupational History  . Not on file  Tobacco Use  . Smoking status: Former Smoker    Packs/day: 3.00    Years: 48.00    Pack years: 144.00    Types: Cigarettes    Quit date:  2000    Years since quitting: 22.0  . Smokeless tobacco: Never Used  Vaping Use  . Vaping Use: Never used  Substance and Sexual Activity  . Alcohol use: Never  . Drug use: Never  . Sexual activity: Never  Other Topics Concern  . Not on file  Social History Narrative   ** Merged History Encounter **       Social Determinants of Health   Financial Resource Strain: Not on file  Food Insecurity: No Food Insecurity  . Worried About Charity fundraiser in the Last Year: Never true  . Ran Out of Food in the Last Year: Never true  Transportation Needs: No Transportation Needs  . Lack of Transportation (Medical): No  . Lack of Transportation (Non-Medical): No  Physical Activity: Inactive  . Days of Exercise per Week: 0 days  . Minutes of Exercise per Session: 0 min  Stress: Not on file  Social Connections: Not on file     Family History: The patient's family history includes Hypertension in his mother; Other in his father.  ROS:   Please see the history of present illness.     All other systems reviewed and are negative.  EKGs/Labs/Other Studies Reviewed:    The following studies were reviewed today:  Echo November 2020-EF 40 to 45%  EKG: Prior EKG A. fib right bundle  branch block personally reviewed on 03/11/2019  Recent Labs: 12/09/2019: B Natriuretic Peptide 80.6 12/10/2019: ALT 21; Magnesium 1.9; TSH 1.140 12/12/2019: Hemoglobin 14.5; Platelets 227 01/27/2020: BUN 15; Creatinine, Ser 0.99; Potassium 4.4; Sodium 143  Recent Lipid Panel    Component Value Date/Time   CHOL 114 11/04/2019 0214   TRIG 60 11/04/2019 0214   HDL 34 (L) 11/04/2019 0214   CHOLHDL 3.4 11/04/2019 0214   VLDL 12 11/04/2019 0214   LDLCALC 68 11/04/2019 0214    Physical Exam:    VS:  BP 110/60 (BP Location: Left Arm, Patient Position: Sitting, Cuff Size: Normal)   Pulse 99   Ht _0  (1.727 m)   Wt 234 lb (106.1 kg)   SpO2 94%   BMI 35.58 kg/m     Wt Readings from Last 3 Encounters:  03/16/20 234 lb (106.1 kg)  01/27/20 251 lb (113.9 kg)  12/11/19 260 lb 2.3 oz (118 kg)     GEN: Well nourished, well developed, in no acute distress  HEENT: normal  Neck: no JVD, carotid bruits, or masses Cardiac: irreg; no murmurs, rubs, or gallops,no edema  Respiratory:  clear to auscultation bilaterally, normal work of breathing GI: soft, nontender, nondistended, + BS MS: no deformity or atrophy  Skin: warm and dry, no rash Neuro:  Alert and Oriented x 3, Strength and sensation are intact Psych: euthymic mood, full affect   ASSESSMENT:    1. Acute on chronic combined systolic and diastolic CHF (congestive heart failure) ([place])   2. Essential hypertension    PLAN:    In order of problems listed above:  Persistent atrial fibrillation -CHA2DS2-VASc 4.  Symptomatic.  Try to repeat cardioversion again.  He has been offered the option of ablation by Dr. Curt Murray if this does not work.  Cardioversion in the next few days. -On amiodarone 200 mg twice daily.  Continuing with goal-directed monitoring of this high risk medication Prior chest x-ray 2019 viewed and interpreted showed no active lung disease, cardiomegaly noted.  TSH 2.6, normal.  ALT 27 normal.  Creatinine 1.03.  Office  note from Dr. Curt Murray, [place]  reviewed and summarized above. -- 4th cardioversion.  --  last INR 2.2 on 03/11/2020.  Stable.  Obstructive sleep apnea -Continue treatment.  Chronic systolic and diastolic heart failure -Seems to doing quite well from a volume standpoint.  EF 20 to 25% metoprolol, losartan.  No changes made.  NYHA class I-II  Chronic anticoagulation/secondary hypercoagulable state -On Coumadin, no bleeding, no changes  RBBB -Chronic.  No syncope.  Watch with amiodarone.  Last lab work excellent.  Morbid obesity -Continue to encourage weight loss, decrease carbohydrates and caloric count.  Exercise.  Medication Adjustments/Labs and Tests Ordered: Current medicines are reviewed at length with the patient today.  Concerns regarding medicines are outlined above.  No orders of the defined types were placed in this encounter.  No orders of the defined types were placed in this encounter.   Patient Instructions  Medication Instructions:  The current medical regimen is effective;  continue present plan and medications.  *If you need a refill on your cardiac medications before your next appointment, please call your pharmacy*  Follow-Up: At [facility], you and your health needs are our priority.  As part of our continuing mission to provide you with exceptional heart care, we have created designated Provider Care Teams.  These Care Teams include your primary Cardiologist (physician) and Advanced Practice Providers (APPs -  Physician Assistants and Nurse Practitioners) who all work together to provide you with the care you need, when you need it.  We recommend signing up for the patient portal called "MyChart".  Sign up information is provided on this After Visit Summary.  MyChart is used to connect with patients for Virtual Visits (Telemedicine).  Patients are able to view lab/test results, encounter notes, upcoming appointments, etc.  Non-urgent messages can be sent to your  provider as well.   To learn more about what you can do with MyChart, go to [email or web address].    Your next appointment:   6 month(s)  The format for your next appointment:   In Person  Provider:   [name], MD   Thank you for choosing [facility]!!         Signed, Ruben Furbish, MD  03/16/2020 4:20 PM    [place]

## 2020-03-16 NOTE — Patient Instructions (Signed)

## 2020-03-25 ENCOUNTER — Other Ambulatory Visit: Payer: Self-pay

## 2020-03-25 NOTE — Patient Instructions (Signed)
Goals Addressed            This Visit's Progress   . Make and Keep All Appointments   On track    Timeframe:  Long-Range Goal Priority:  High Start Date:   02/25/20                          Expected End Date: 05/03/20                Follow Up Date  05/03/20   - call to cancel if needed - keep a calendar with appointment dates    Why is this important?   Part of staying healthy is seeing the doctor for follow-up care.  If you forget your appointments, there are some things you can do to stay on track.    Notes:     . Track and Manage Symptoms   On track    Timeframe:  Long-Range Goal Priority:  High Start Date:   02/25/20                          Expected End Date:   05/03/20                Follow Up Date 05/03/20  - bring diary to all appointments - know when to call the doctor - track symptoms and what helps feel better or worse    Why is this important?   You will be able to handle your symptoms better if you keep track of them.  Making some simple changes to your lifestyle will help.  Eating healthy is one thing you can do to take good care of yourself.    Notes: Patient managing heart failure well.  Patient weighing daily.  03/25/20 -230 lbs

## 2020-04-08 DIAGNOSIS — I4891 Unspecified atrial fibrillation: Secondary | ICD-10-CM | POA: Diagnosis not present

## 2020-04-08 DIAGNOSIS — Z7901 Long term (current) use of anticoagulants: Secondary | ICD-10-CM | POA: Diagnosis not present

## 2020-04-13 ENCOUNTER — Other Ambulatory Visit: Payer: Self-pay

## 2020-04-13 ENCOUNTER — Ambulatory Visit: Payer: Medicare HMO | Admitting: Podiatry

## 2020-04-13 ENCOUNTER — Encounter: Payer: Self-pay | Admitting: Podiatry

## 2020-04-13 DIAGNOSIS — M79674 Pain in right toe(s): Secondary | ICD-10-CM

## 2020-04-13 DIAGNOSIS — M2012 Hallux valgus (acquired), left foot: Secondary | ICD-10-CM

## 2020-04-13 DIAGNOSIS — B351 Tinea unguium: Secondary | ICD-10-CM | POA: Diagnosis not present

## 2020-04-13 DIAGNOSIS — M2011 Hallux valgus (acquired), right foot: Secondary | ICD-10-CM

## 2020-04-13 DIAGNOSIS — M79675 Pain in left toe(s): Secondary | ICD-10-CM | POA: Diagnosis not present

## 2020-04-13 DIAGNOSIS — L84 Corns and callosities: Secondary | ICD-10-CM

## 2020-04-13 DIAGNOSIS — M129 Arthropathy, unspecified: Secondary | ICD-10-CM

## 2020-04-13 DIAGNOSIS — M21612 Bunion of left foot: Secondary | ICD-10-CM

## 2020-04-13 DIAGNOSIS — M21611 Bunion of right foot: Secondary | ICD-10-CM

## 2020-04-13 NOTE — Progress Notes (Signed)
This patient returns to the office for evaluation and treatment of long thick painful nails .  This patient is unable to trim his own nails since the patient cannot reach his feet.  Patient says the nails are painful walking and wearing his shoes. He has also developed callus on his right foot .  These calluses are painful walking and wearing his shoes.  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  Patient is taking eliquis.  He returns for preventive foot care services.  General Appearance  Alert, conversant and in no acute stress.  Vascular  Dorsalis pedis and posterior tibial  pulses are weakly  palpable  bilaterally.  Capillary return is within normal limits  bilaterally. Cold feet   Bilaterally.  Absent digital hair  B/L.  Neurologic  Senn-Weinstein monofilament wire test within normal limits  bilaterally. Muscle power within normal limits bilaterally.  Nails Thick disfigured discolored nails with subungual debris  from hallux to fifth toes bilaterally. No evidence of bacterial infection or drainage bilaterally.  Orthopedic  No limitations of motion  feet .  No crepitus or effusions noted.  No bony pathology or digital deformities noted. 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  Unilateral pes planus right foot.  Skin  normotropic skin with no porokeratosis noted bilaterally.  No signs of infections or ulcers noted.   Callus noted plantar aspect 1st MCJ right foot.  Pinch callus right hallux.    Onychomycosis  Pain in toes right foot  Pain in toes left foot  Debridement  of nails  1-5  B/L with a nail nipper.  Nails were then filed using a dremel tool with no incidents. He is scheduled to return due to at risk condition.   RTC 10 weeks.   Gardiner Barefoot DPM

## 2020-04-16 DIAGNOSIS — R634 Abnormal weight loss: Secondary | ICD-10-CM | POA: Diagnosis not present

## 2020-04-16 DIAGNOSIS — I5022 Chronic systolic (congestive) heart failure: Secondary | ICD-10-CM | POA: Diagnosis not present

## 2020-04-16 DIAGNOSIS — I10 Essential (primary) hypertension: Secondary | ICD-10-CM | POA: Diagnosis not present

## 2020-04-22 DIAGNOSIS — I4891 Unspecified atrial fibrillation: Secondary | ICD-10-CM | POA: Diagnosis not present

## 2020-04-22 DIAGNOSIS — Z7901 Long term (current) use of anticoagulants: Secondary | ICD-10-CM | POA: Diagnosis not present

## 2020-04-26 DIAGNOSIS — I10 Essential (primary) hypertension: Secondary | ICD-10-CM | POA: Diagnosis not present

## 2020-04-26 DIAGNOSIS — I5023 Acute on chronic systolic (congestive) heart failure: Secondary | ICD-10-CM | POA: Diagnosis not present

## 2020-04-26 DIAGNOSIS — G40909 Epilepsy, unspecified, not intractable, without status epilepticus: Secondary | ICD-10-CM | POA: Diagnosis not present

## 2020-04-26 DIAGNOSIS — Z7901 Long term (current) use of anticoagulants: Secondary | ICD-10-CM | POA: Diagnosis not present

## 2020-04-26 DIAGNOSIS — R7303 Prediabetes: Secondary | ICD-10-CM | POA: Diagnosis not present

## 2020-04-26 DIAGNOSIS — E7800 Pure hypercholesterolemia, unspecified: Secondary | ICD-10-CM | POA: Diagnosis not present

## 2020-04-29 ENCOUNTER — Other Ambulatory Visit: Payer: Self-pay

## 2020-04-29 NOTE — Patient Instructions (Signed)
Goals Addressed            This Visit's Progress   . Make and Keep All Appointments   On track    Timeframe:  Long-Range Goal Priority:  High Start Date:   02/25/20                          Expected End Date: 05/03/20                Follow Up Date  05/03/20   - call to cancel if needed - keep a calendar with appointment dates    Why is this important?   Part of staying healthy is seeing the doctor for follow-up care.  If you forget your appointments, there are some things you can do to stay on track.    Notes: 04/29/20 patient sees physicians as scheduled.     . Track and Manage Symptoms   On track    Timeframe:  Long-Range Goal Priority:  High Start Date:   02/25/20                          Expected End Date:   11/03/20             Follow Up Date 07/03/20  - follow rescue plan if symptoms flare-up - track symptoms and what helps feel better or worse    Why is this important?   You will be able to handle your symptoms better if you keep track of them.  Making some simple changes to your lifestyle will help.  Eating healthy is one thing you can do to take good care of yourself.    Notes: Patient managing heart failure well.  Patient weighing daily.  03/25/20 -230 lbs 04/29/20 weight 220 lbs

## 2020-04-29 NOTE — Patient Outreach (Signed)
[facility]) Care Management  [place]  04/29/2020   Ruben Murray 1945/06/26 [id number]  Subjective: Telephone call to patient for disease management follow up. Patient doing ok.  Continues heart failure management. Patient sees PCP regularly. Patient active with remote health.  Patient voices no concerns.   Objective:   Encounter Medications:  Outpatient Encounter Medications as of 04/29/2020  Medication Sig  . acetaminophen (TYLENOL) 500 MG tablet Take 500-1,000 mg by mouth daily as needed for moderate pain.   Marland Kitchen amiodarone (PACERONE) 200 MG tablet Take 1 tablet (200 mg total) by mouth daily.  Marland Kitchen apixaban (ELIQUIS) 5 MG TABS tablet Eliquis 5 mg tablet  TAKE 1 TABLET BY MOUTH TWICE DAILY  . ARTIFICIAL TEAR SOLUTION OP Place 1 drop into both eyes 2 (two) times daily.  . benzonatate (TESSALON) 200 MG capsule Take 200 mg by mouth 3 (three) times daily as needed.  . cephALEXin (KEFLEX) 500 MG capsule cephalexin 500 mg capsule  TAKE 1 CAPSULE BY MOUTH 4 TIMES DAILY FOR 7 DAYS  . ciclopirox (LOPROX) 0.77 % cream ciclopirox 0.77 % topical cream  APPLY TOPICALLY TWICE DAILY FOR 30 DAYS  . digoxin (LANOXIN) 0.125 MG tablet Take 1 tablet (0.125 mg total) by mouth daily.  Marland Kitchen doxycycline (VIBRAMYCIN) 100 MG capsule Take 100 mg by mouth 2 (two) times daily.  . famotidine (PEPCID) 20 MG tablet One at bedtime  . finasteride (PROSCAR) 5 MG tablet Take 5 mg by mouth daily at 12 noon.   . fluticasone (FLONASE) 50 MCG/ACT nasal spray Place 2 sprays into both nostrils 2 (two) times daily.  . furosemide (LASIX) 40 MG tablet Take 1 tablet (40 mg total) by mouth daily.  . methylPREDNISolone (MEDROL DOSEPAK) 4 MG TBPK tablet 6 day dose pack - take as directed  . metoprolol succinate (TOPROL-XL) 100 MG 24 hr tablet Take 1 tablet (100 mg total) by mouth daily. Take with or immediately following a meal.  . Multiple Vitamin (MULTIVITAMIN WITH MINERALS) TABS tablet Take 1 tablet by  mouth daily at 12 noon.  Marland Kitchen PHENobarbital (LUMINAL) 97.2 MG tablet Take 97.2 mg by mouth at bedtime.   . polyvinyl alcohol (LIQUIFILM TEARS) 1.4 % ophthalmic solution Place 1 drop into both eyes daily with lunch.  . potassium chloride (KLOR-CON) 10 MEQ tablet potassium chloride ER 10 mEq tablet,extended release(part/cryst)  TAKE 1 TABLET BY MOUTH ONCE DAILY  . pravastatin (PRAVACHOL) 40 MG tablet Take 40 mg by mouth at bedtime.   Marland Kitchen SHINGRIX injection   . warfarin (COUMADIN) 3 MG tablet Take 3 mg by mouth See admin instructions. Mon,Tues,Wed,Thurs,Fri,Sat only   No facility-administered encounter medications on file as of 04/29/2020.    Functional Status:  In your present state of health, do you have any difficulty performing the following activities: 02/25/2020 12/15/2019  Hearing? Y Y  Comment trouble hearing at times trouble hearing at times  Vision? N N  Difficulty concentrating or making decisions? N N  Walking or climbing stairs? Y Y  Comment knee problems knee problems  Dressing or bathing? N N  Doing errands, shopping? N N  Preparing Food and eating ? N N  Using the Toilet? N N  In the past six months, have you accidently leaked urine? N N  Do you have problems with loss of bowel control? N N  Managing your Medications? N N  Managing your Finances? N N  Housekeeping or managing your Housekeeping? N N  Some recent data might be  hidden    Fall/Depression Screening: Fall Risk  02/25/2020 12/15/2019 11/11/2019  Falls in the past year? 1 0 0  Number falls in past yr: 0 0 -  Injury with Fall? 0 0 -  Risk for fall due to : History of fall(s) - -  Follow up Falls prevention discussed - -   PHQ 2/9 Scores 02/25/2020 11/11/2019 06/12/2019 06/07/2018 02/18/2018 11/15/2017  PHQ - 2 Score 0 0 0 0 1 0    Assessment:  Goals Addressed            This Visit's Progress   . Make and Keep All Appointments   On track    Timeframe:  Long-Range Goal Priority:  High Start Date:   02/25/20                           Expected End Date: 05/03/20                Follow Up Date  05/03/20   - call to cancel if needed - keep a calendar with appointment dates    Why is this important?   Part of staying healthy is seeing the doctor for follow-up care.  If you forget your appointments, there are some things you can do to stay on track.    Notes: 04/29/20 patient sees physicians as scheduled.     . Track and Manage Symptoms   On track    Timeframe:  Long-Range Goal Priority:  High Start Date:   02/25/20                          Expected End Date:   11/03/20             Follow Up Date 07/03/20  - follow rescue plan if symptoms flare-up - track symptoms and what helps feel better or worse    Why is this important?   You will be able to handle your symptoms better if you keep track of them.  Making some simple changes to your lifestyle will help.  Eating healthy is one thing you can do to take good care of yourself.    Notes: Patient managing heart failure well.  Patient weighing daily.  03/25/20 -230 lbs 04/29/20 weight 220 lbs       Plan: RN CM will contact in April Follow-up:  Patient agrees to Care Plan and Follow-up.   [name], RN, MSN [place] Management Care Management Coordinator Direct Line [phone] Cell [phone] Toll Free: [phone]  Fax: [phone]

## 2020-05-06 ENCOUNTER — Other Ambulatory Visit: Payer: Self-pay

## 2020-05-06 DIAGNOSIS — I480 Paroxysmal atrial fibrillation: Secondary | ICD-10-CM | POA: Diagnosis not present

## 2020-05-06 DIAGNOSIS — Z7901 Long term (current) use of anticoagulants: Secondary | ICD-10-CM | POA: Diagnosis not present

## 2020-05-06 MED ORDER — DIGOXIN 125 MCG PO TABS
0.1250 mg | ORAL_TABLET | Freq: Every day | ORAL | 3 refills | Status: DC
Start: 2020-05-06 — End: 2021-04-24

## 2020-05-27 ENCOUNTER — Ambulatory Visit: Payer: Self-pay

## 2020-06-02 DIAGNOSIS — Z7901 Long term (current) use of anticoagulants: Secondary | ICD-10-CM | POA: Diagnosis not present

## 2020-06-02 DIAGNOSIS — I480 Paroxysmal atrial fibrillation: Secondary | ICD-10-CM | POA: Diagnosis not present

## 2020-06-14 ENCOUNTER — Other Ambulatory Visit: Payer: Self-pay

## 2020-06-14 NOTE — Patient Instructions (Signed)
Goals    . Make and Keep All Appointments     Timeframe:  Long-Range Goal Priority:  High Start Date:   02/25/20                          Expected End Date: 12/03/20                Follow Up Date  09/02/20   - call to cancel if needed - keep a calendar with appointment dates    Why is this important?   Part of staying healthy is seeing the doctor for follow-up care.  If you forget your appointments, there are some things you can do to stay on track.    Notes: 04/29/20 patient sees physicians as scheduled.  06/14/20 Patient continues to see physicians as scheduled.  No transportation problems.    . Track and Manage Symptoms     Timeframe:  Long-Range Goal Priority:  High Start Date:   02/25/20                          Expected End Date:   11/03/20             Follow Up Date 09/02/20  - follow rescue plan if symptoms flare-up - know when to call the doctor    Why is this important?   You will be able to handle your symptoms better if you keep track of them.  Making some simple changes to your lifestyle will help.  Eating healthy is one thing you can do to take good care of yourself.    Notes: Patient managing heart failure well.  Patient weighing daily.  03/25/20 -230 lbs 04/29/20 weight 220 lbs 06/14/20 Weight 210 lbs.

## 2020-06-14 NOTE — Patient Outreach (Signed)
[facility]) Care Management  [place]  06/14/2020   Ruben Murray June 12, 1945 [id number]  Subjective:   Telephone call to patient for disease management follow up. Patient reports doing fairly well.  He reports continued weight loss and will be seeing PCP soon for follow up about the weight loss as she is tracking it.  Discussed colonoscopy as possible diagnostic tool to be sure it is not his colon.  He states he does not want another one of those.  Advised him to discuss with physician his preferences.  He verbalized understanding.  Patient weight report 210 lbs today.    Discussed heart failure and management.  He voices no concerns.    Objective:   Encounter Medications:  Outpatient Encounter Medications as of 06/14/2020  Medication Sig  . acetaminophen (TYLENOL) 500 MG tablet Take 500-1,000 mg by mouth daily as needed for moderate pain.   Marland Kitchen amiodarone (PACERONE) 200 MG tablet Take 1 tablet (200 mg total) by mouth daily.  Marland Kitchen apixaban (ELIQUIS) 5 MG TABS tablet Eliquis 5 mg tablet  TAKE 1 TABLET BY MOUTH TWICE DAILY  . ARTIFICIAL TEAR SOLUTION OP Place 1 drop into both eyes 2 (two) times daily.  . benzonatate (TESSALON) 200 MG capsule Take 200 mg by mouth 3 (three) times daily as needed.  . cephALEXin (KEFLEX) 500 MG capsule cephalexin 500 mg capsule  TAKE 1 CAPSULE BY MOUTH 4 TIMES DAILY FOR 7 DAYS  . ciclopirox (LOPROX) 0.77 % cream ciclopirox 0.77 % topical cream  APPLY TOPICALLY TWICE DAILY FOR 30 DAYS  . digoxin (LANOXIN) 0.125 MG tablet Take 1 tablet (0.125 mg total) by mouth daily.  Marland Kitchen doxycycline (VIBRAMYCIN) 100 MG capsule Take 100 mg by mouth 2 (two) times daily.  . famotidine (PEPCID) 20 MG tablet One at bedtime  . finasteride (PROSCAR) 5 MG tablet Take 5 mg by mouth daily at 12 noon.   . fluticasone (FLONASE) 50 MCG/ACT nasal spray Place 2 sprays into both nostrils 2 (two) times daily.  . furosemide (LASIX) 40 MG tablet Take 1 tablet (40 mg total)  by mouth daily.  . methylPREDNISolone (MEDROL DOSEPAK) 4 MG TBPK tablet 6 day dose pack - take as directed  . metoprolol succinate (TOPROL-XL) 100 MG 24 hr tablet Take 1 tablet (100 mg total) by mouth daily. Take with or immediately following a meal.  . Multiple Vitamin (MULTIVITAMIN WITH MINERALS) TABS tablet Take 1 tablet by mouth daily at 12 noon.  Marland Kitchen PHENobarbital (LUMINAL) 97.2 MG tablet Take 97.2 mg by mouth at bedtime.   . polyvinyl alcohol (LIQUIFILM TEARS) 1.4 % ophthalmic solution Place 1 drop into both eyes daily with lunch.  . potassium chloride (KLOR-CON) 10 MEQ tablet potassium chloride ER 10 mEq tablet,extended release(part/cryst)  TAKE 1 TABLET BY MOUTH ONCE DAILY  . pravastatin (PRAVACHOL) 40 MG tablet Take 40 mg by mouth at bedtime.   Marland Kitchen SHINGRIX injection   . warfarin (COUMADIN) 3 MG tablet Take 3 mg by mouth See admin instructions. Mon,Tues,Wed,Thurs,Fri,Sat only   No facility-administered encounter medications on file as of 06/14/2020.    Functional Status:  In your present state of health, do you have any difficulty performing the following activities: 02/25/2020 12/15/2019  Hearing? Y Y  Comment trouble hearing at times trouble hearing at times  Vision? N N  Difficulty concentrating or making decisions? N N  Walking or climbing stairs? Y Y  Comment knee problems knee problems  Dressing or bathing? N N  Doing  errands, shopping? N N  Preparing Food and eating ? N N  Using the Toilet? N N  In the past six months, have you accidently leaked urine? N N  Do you have problems with loss of bowel control? N N  Managing your Medications? N N  Managing your Finances? N N  Housekeeping or managing your Housekeeping? N N  Some recent data might be hidden    Fall/Depression Screening: Fall Risk  06/14/2020 02/25/2020 12/15/2019  Falls in the past year? 1 1 0  Number falls in past yr: 0 0 0  Injury with Fall? 0 0 0  Risk for fall due to : History of fall(s) History of  fall(s) -  Follow up Falls prevention discussed Falls prevention discussed -   PHQ 2/9 Scores 02/25/2020 11/11/2019 06/12/2019 06/07/2018 02/18/2018 11/15/2017  PHQ - 2 Score 0 0 0 0 1 0    Assessment:  Goals Addressed            This Visit's Progress   . Make and Keep All Appointments   On track    Timeframe:  Long-Range Goal Priority:  High Start Date:   02/25/20                          Expected End Date: 12/03/20                Follow Up Date  09/02/20   - call to cancel if needed - keep a calendar with appointment dates    Why is this important?   Part of staying healthy is seeing the doctor for follow-up care.  If you forget your appointments, there are some things you can do to stay on track.    Notes: 04/29/20 patient sees physicians as scheduled.  06/14/20 Patient continues to see physicians as scheduled.  No transportation problems.    . Track and Manage Symptoms   On track    Timeframe:  Long-Range Goal Priority:  High Start Date:   02/25/20                          Expected End Date:   11/03/20             Follow Up Date 09/02/20  - follow rescue plan if symptoms flare-up - know when to call the doctor    Why is this important?   You will be able to handle your symptoms better if you keep track of them.  Making some simple changes to your lifestyle will help.  Eating healthy is one thing you can do to take good care of yourself.    Notes: Patient managing heart failure well.  Patient weighing daily.  03/25/20 -230 lbs 04/29/20 weight 220 lbs 06/14/20 Weight 210 lbs.       Plan: RN CM will follow up in June.   Follow-up:  Patient agrees to Care Plan and Follow-up.   [name], RN, MSN [place] Management Care Management Coordinator Direct Line [phone] Cell [phone] Toll Free: [phone]  Fax: [phone]

## 2020-06-21 DIAGNOSIS — Z7901 Long term (current) use of anticoagulants: Secondary | ICD-10-CM | POA: Diagnosis not present

## 2020-06-22 ENCOUNTER — Ambulatory Visit (INDEPENDENT_AMBULATORY_CARE_PROVIDER_SITE_OTHER): Payer: Medicare Other | Admitting: Podiatry

## 2020-06-22 ENCOUNTER — Encounter: Payer: Self-pay | Admitting: Podiatry

## 2020-06-22 ENCOUNTER — Other Ambulatory Visit: Payer: Self-pay

## 2020-06-22 DIAGNOSIS — M79675 Pain in left toe(s): Secondary | ICD-10-CM | POA: Diagnosis not present

## 2020-06-22 DIAGNOSIS — M2012 Hallux valgus (acquired), left foot: Secondary | ICD-10-CM | POA: Diagnosis not present

## 2020-06-22 DIAGNOSIS — M79674 Pain in right toe(s): Secondary | ICD-10-CM

## 2020-06-22 DIAGNOSIS — L84 Corns and callosities: Secondary | ICD-10-CM | POA: Diagnosis not present

## 2020-06-22 DIAGNOSIS — B351 Tinea unguium: Secondary | ICD-10-CM | POA: Diagnosis not present

## 2020-06-22 DIAGNOSIS — M129 Arthropathy, unspecified: Secondary | ICD-10-CM | POA: Diagnosis not present

## 2020-06-22 DIAGNOSIS — M21612 Bunion of left foot: Secondary | ICD-10-CM

## 2020-06-22 NOTE — Progress Notes (Signed)
This patient returns to my office for at risk foot care.  This patient requires this care by a professional since this patient will be at risk due to having coagulation defect due to coumadin and eliquis. This patient is unable to cut nails himself since the patient cannot reach his nails.These nails are painful walking and wearing shoes.  This patient presents for at risk foot care today.  General Appearance  Alert, conversant and in no acute stress.  Vascular  Dorsalis pedis and posterior tibial  pulses are weakly  palpable  bilaterally.  Capillary return is within normal limits  Bilaterally  Cold feet  Bilaterally.  Absent digital hair  B/L.  Neurologic  Senn-Weinstein monofilament wire test within normal limits  bilaterally. Muscle power within normal limits bilaterally.  Nails Thick disfigured discolored nails with subungual debris  from hallux to fifth toes bilaterally. No evidence of bacterial infection or drainage bilaterally.  Orthopedic  No limitations of motion  feet .  No crepitus or effusions noted.  No bony pathology or digital deformities noted. DJD 1st MCJ  Right foot.  HAV  right  Skin  normotropic skin with no porokeratosis noted bilaterally.  No signs of infections or ulcers noted. Pinch callus  Right.    Onychomycosis  Pain in right toes  Pain in left toes  Consent was obtained for treatment procedures.   Mechanical debridement of nails 1-5  bilaterally performed with a nail nipper.  Filed with dremel without incident.    Return office visit   10 weeks                   Told patient to return for periodic foot care and evaluation due to potential at risk complications.   Gardiner Barefoot DPM

## 2020-06-24 ENCOUNTER — Ambulatory Visit: Payer: Self-pay

## 2020-07-08 DIAGNOSIS — I480 Paroxysmal atrial fibrillation: Secondary | ICD-10-CM | POA: Diagnosis not present

## 2020-07-08 DIAGNOSIS — Z7901 Long term (current) use of anticoagulants: Secondary | ICD-10-CM | POA: Diagnosis not present

## 2020-07-23 DIAGNOSIS — R634 Abnormal weight loss: Secondary | ICD-10-CM | POA: Diagnosis not present

## 2020-07-23 DIAGNOSIS — Z7901 Long term (current) use of anticoagulants: Secondary | ICD-10-CM | POA: Diagnosis not present

## 2020-08-11 ENCOUNTER — Other Ambulatory Visit: Payer: Self-pay

## 2020-08-11 NOTE — Patient Outreach (Signed)
[facility]) Care Management  08/11/2020  Ruben Murray 02-Jul-1945 [id number]   Telephone call to patient for disease management follow up.   No answer.  HIPAA compliant voice message left.    Plan: If no return call, RN CM will attempt patient again in the month of August.  [name], RN, MSN [place] Management Care Management Coordinator Direct Line [phone] Cell [phone] Toll Free: [phone]  Fax: [phone]

## 2020-08-13 DIAGNOSIS — Z7901 Long term (current) use of anticoagulants: Secondary | ICD-10-CM | POA: Diagnosis not present

## 2020-08-18 ENCOUNTER — Other Ambulatory Visit: Payer: Self-pay | Admitting: Cardiology

## 2020-08-18 DIAGNOSIS — I110 Hypertensive heart disease with heart failure: Secondary | ICD-10-CM | POA: Diagnosis not present

## 2020-08-18 DIAGNOSIS — E785 Hyperlipidemia, unspecified: Secondary | ICD-10-CM | POA: Diagnosis not present

## 2020-08-18 DIAGNOSIS — I5022 Chronic systolic (congestive) heart failure: Secondary | ICD-10-CM | POA: Diagnosis not present

## 2020-08-18 DIAGNOSIS — J4540 Moderate persistent asthma, uncomplicated: Secondary | ICD-10-CM | POA: Diagnosis not present

## 2020-08-18 DIAGNOSIS — I4892 Unspecified atrial flutter: Secondary | ICD-10-CM | POA: Diagnosis not present

## 2020-08-18 DIAGNOSIS — M545 Low back pain, unspecified: Secondary | ICD-10-CM | POA: Diagnosis not present

## 2020-08-18 NOTE — Telephone Encounter (Signed)
Pt c/o medication issue:  1. Name of Medication: sacubitril-valsartan (ENTRESTO) 24-26 MG  2. How are you currently taking this medication (dosage and times per day)? Patient is not taking. Patient has been out for a month   3. Are you having a reaction (difficulty breathing--STAT)? no  4. What is your medication issue? Cost   Lattie Haw, [place] Nurse called to report that the patient had not been taking this medication for a month as it was too expensive. She wanted to request a replacement medication, but her med list and  the current med list in the patient's chart are not matching up.  She requests that we follow up with the patient to determine what the patient is and is not  taking

## 2020-08-19 NOTE — Telephone Encounter (Signed)
**Note De-Identified Kaydance Bowie Obfuscation** I called the pt to discuss. He states he cannot afford Entresto and has been out for at least 3 month now. I strongly advised him to call us in the future if he cannot afford his medications.  He states that he did call us but there is no notes in his chart that indicates this. He has s/w [facility] several times concerning his medications per chart notes but he does not know who he was talking to as he states "yall are all together" and Ive s/w so many people.  He also says that he would like to take generic meds only as name brand is to expensive for him.  I mentioned pt asst but he states that [employer] pt asst denied him asst because his household income is greater than their income limit for approval.  [name] is not on the pts current med list and he states he knows nothing about it being stopped.  Per the pt office visit notes from 03/16/20 with Dr [name]:  Chronic systolic and diastolic heart failure -Seems to doing quite well from a volume standpoint.  EF 20 to 25% metoprolol, losartan.  No changes made.  NYHA class I-II  Will forward to Dr [name] and his nurse for advisement to the pt.

## 2020-08-23 NOTE — Telephone Encounter (Signed)
**Note De-Identified Ruben Murray Obfuscation** The pt was advised on 10/01/18 to stop taking Entresto due to cost and to start taking Losartan 50 mg.  [name] has been added and removed from the pts med list several times since then.  Per [name] the pt should start taking Losartan 25 mg daily.  I called the pt but got no answer and no way to leave a message. I will continue to call.

## 2020-08-25 MED ORDER — LOSARTAN POTASSIUM 50 MG PO TABS: 50.0000 mg | ORAL_TABLET | Freq: Every day | ORAL | 3 refills | Status: DC

## 2020-08-25 NOTE — Addendum Note (Signed)
Addended by: Shellia Cleverly on: 08/25/2020 05:08 PM   Modules accepted: Orders

## 2020-08-25 NOTE — Telephone Encounter (Signed)
*  STAT* If patient is at the pharmacy, call can be transferred to refill team.   1. Which medications need to be refilled? (please list name of each medication and dose if known) Losartan 50 MG tablet  2. Which pharmacy/location (including street and city if local pharmacy) is medication to be sent to? [facility] -[address] [place], [place] [address]  3. Do they need a 30 day or 90 day supply?  90 day supply  Patient is following up, requesting to have the Rx sent to his local pharmacy listed above.

## 2020-08-25 NOTE — Telephone Encounter (Signed)
**Note De-Identified Ruben Murray Obfuscation** The pt is advised that he is no longer taking Entresto due to the cost and to please advise his other MDs of this as well because he has gone without his Entresto or Losartan for more than 2 months due to medication confusion.  He is requesting that I e-scribe his Losartan RX to his new insurance companies [facility]) pharmacy but does not know who the pharmacy is. He is checking with a nurse at his PCPs office to find out the name of the mail pharmacy and I have advised him that if they do not know he will need to contact his ins plan for their information. He states that he will call back to let me me know where to send his Losartan RX to fill.

## 2020-08-25 NOTE — Telephone Encounter (Signed)
Losartan 50 mg one po daily #90 with refills X 3 sent electronically to [facility] as requested.

## 2020-08-27 DIAGNOSIS — Z7901 Long term (current) use of anticoagulants: Secondary | ICD-10-CM | POA: Diagnosis not present

## 2020-08-30 DIAGNOSIS — Z7901 Long term (current) use of anticoagulants: Secondary | ICD-10-CM | POA: Diagnosis not present

## 2020-08-30 DIAGNOSIS — I480 Paroxysmal atrial fibrillation: Secondary | ICD-10-CM | POA: Diagnosis not present

## 2020-08-31 ENCOUNTER — Telehealth: Payer: Self-pay

## 2020-08-31 ENCOUNTER — Encounter: Payer: Self-pay | Admitting: Podiatry

## 2020-08-31 ENCOUNTER — Ambulatory Visit (INDEPENDENT_AMBULATORY_CARE_PROVIDER_SITE_OTHER): Payer: Medicare Other | Admitting: Podiatry

## 2020-08-31 ENCOUNTER — Other Ambulatory Visit: Payer: Self-pay

## 2020-08-31 DIAGNOSIS — M21612 Bunion of left foot: Secondary | ICD-10-CM

## 2020-08-31 DIAGNOSIS — M79675 Pain in left toe(s): Secondary | ICD-10-CM | POA: Diagnosis not present

## 2020-08-31 DIAGNOSIS — M79674 Pain in right toe(s): Secondary | ICD-10-CM | POA: Diagnosis not present

## 2020-08-31 DIAGNOSIS — B351 Tinea unguium: Secondary | ICD-10-CM | POA: Diagnosis not present

## 2020-08-31 DIAGNOSIS — D6869 Other thrombophilia: Secondary | ICD-10-CM | POA: Diagnosis not present

## 2020-08-31 DIAGNOSIS — M2012 Hallux valgus (acquired), left foot: Secondary | ICD-10-CM

## 2020-08-31 NOTE — Telephone Encounter (Signed)
[facility] mail order pharmacy stating that the manufacturer of the generic for Digoxin 0.125 mg is changing from [place]. Does Dr. [name] agree to allow Digoxin 0.125 mg tablets from [place] to be dispensed? Please address   Ph# [phone]. Order # [id number]

## 2020-08-31 NOTE — Progress Notes (Signed)
This patient returns to my office for at risk foot care.  This patient requires this care by a professional since this patient will be at risk due to having coagulation defect due to coumadin and eliquis. This patient is unable to cut nails himself since the patient cannot reach his nails.These nails are painful walking and wearing shoes.  This patient presents for at risk foot care today.  General Appearance  Alert, conversant and in no acute stress.  Vascular  Dorsalis pedis and posterior tibial  pulses are weakly  palpable  bilaterally.  Capillary return is within normal limits  Bilaterally  Cold feet  Bilaterally.  Absent digital hair  B/L.  Neurologic  Senn-Weinstein monofilament wire test within normal limits  bilaterally. Muscle power within normal limits bilaterally.  Nails Thick disfigured discolored nails with subungual debris  from hallux to fifth toes bilaterally. No evidence of bacterial infection or drainage bilaterally.  Orthopedic  No limitations of motion  feet .  No crepitus or effusions noted.  No bony pathology or digital deformities noted. DJD 1st MCJ  Right foot.  HAV  right  Skin  normotropic skin with no porokeratosis noted bilaterally.  No signs of infections or ulcers noted. Pinch callus  Right.    Onychomycosis  Pain in right toes  Pain in left toes  Consent was obtained for treatment procedures.   Mechanical debridement of nails 1-5  bilaterally performed with a nail nipper.  Filed with dremel without incident.    Return office visit   10 weeks                   Told patient to return for periodic foot care and evaluation due to potential at risk complications.   Philmore Lepore DPM  

## 2020-08-31 NOTE — Telephone Encounter (Signed)
Reviewed with our pharmacy team who advised no indication know to not be OK.  Called and made [facility] aware.  They will move forward with the refill.

## 2020-09-07 DIAGNOSIS — Z7901 Long term (current) use of anticoagulants: Secondary | ICD-10-CM | POA: Diagnosis not present

## 2020-09-07 DIAGNOSIS — I480 Paroxysmal atrial fibrillation: Secondary | ICD-10-CM | POA: Diagnosis not present

## 2020-09-10 DIAGNOSIS — I4891 Unspecified atrial fibrillation: Secondary | ICD-10-CM | POA: Diagnosis not present

## 2020-09-10 DIAGNOSIS — Z7901 Long term (current) use of anticoagulants: Secondary | ICD-10-CM | POA: Diagnosis not present

## 2020-09-22 DIAGNOSIS — I480 Paroxysmal atrial fibrillation: Secondary | ICD-10-CM | POA: Diagnosis not present

## 2020-09-22 DIAGNOSIS — Z7901 Long term (current) use of anticoagulants: Secondary | ICD-10-CM | POA: Diagnosis not present

## 2020-09-29 DIAGNOSIS — Z7901 Long term (current) use of anticoagulants: Secondary | ICD-10-CM | POA: Diagnosis not present

## 2020-09-29 DIAGNOSIS — I4891 Unspecified atrial fibrillation: Secondary | ICD-10-CM | POA: Diagnosis not present

## 2020-10-14 DIAGNOSIS — I5022 Chronic systolic (congestive) heart failure: Secondary | ICD-10-CM | POA: Diagnosis not present

## 2020-10-14 DIAGNOSIS — Z7901 Long term (current) use of anticoagulants: Secondary | ICD-10-CM | POA: Diagnosis not present

## 2020-10-14 DIAGNOSIS — I480 Paroxysmal atrial fibrillation: Secondary | ICD-10-CM | POA: Diagnosis not present

## 2020-10-14 DIAGNOSIS — Z0000 Encounter for general adult medical examination without abnormal findings: Secondary | ICD-10-CM | POA: Diagnosis not present

## 2020-10-14 DIAGNOSIS — E7800 Pure hypercholesterolemia, unspecified: Secondary | ICD-10-CM | POA: Diagnosis not present

## 2020-10-14 DIAGNOSIS — I10 Essential (primary) hypertension: Secondary | ICD-10-CM | POA: Diagnosis not present

## 2020-10-14 DIAGNOSIS — R7303 Prediabetes: Secondary | ICD-10-CM | POA: Diagnosis not present

## 2020-10-15 ENCOUNTER — Other Ambulatory Visit: Payer: Self-pay

## 2020-10-15 NOTE — Patient Outreach (Signed)
[place] [facility]) Care Management  10/15/2020  Ruben Murray November 23, 1945 [id number]   Telephone call to patient for disease management follow up.   No answer.  HIPAA compliant voice message left.    Plan: If no return call, RN CM will attempt patient again in October.  [name], RN, MSN [place] Management Care Management Coordinator Direct Line [phone] Cell [phone] Toll Free: [phone]  Fax: [phone]

## 2020-10-28 DIAGNOSIS — I5022 Chronic systolic (congestive) heart failure: Secondary | ICD-10-CM | POA: Diagnosis not present

## 2020-10-28 DIAGNOSIS — R569 Unspecified convulsions: Secondary | ICD-10-CM | POA: Diagnosis not present

## 2020-11-11 DIAGNOSIS — Z7901 Long term (current) use of anticoagulants: Secondary | ICD-10-CM | POA: Diagnosis not present

## 2020-11-22 DIAGNOSIS — Z23 Encounter for immunization: Secondary | ICD-10-CM | POA: Diagnosis not present

## 2020-11-22 DIAGNOSIS — I480 Paroxysmal atrial fibrillation: Secondary | ICD-10-CM | POA: Diagnosis not present

## 2020-11-22 DIAGNOSIS — Z599 Problem related to housing and economic circumstances, unspecified: Secondary | ICD-10-CM | POA: Diagnosis not present

## 2020-11-22 DIAGNOSIS — Z7901 Long term (current) use of anticoagulants: Secondary | ICD-10-CM | POA: Diagnosis not present

## 2020-12-06 ENCOUNTER — Ambulatory Visit: Payer: Medicare Other | Admitting: Podiatry

## 2020-12-17 ENCOUNTER — Other Ambulatory Visit: Payer: Self-pay

## 2020-12-17 DIAGNOSIS — Z7901 Long term (current) use of anticoagulants: Secondary | ICD-10-CM | POA: Diagnosis not present

## 2020-12-17 NOTE — Patient Outreach (Signed)
[facility]) Care Management  12/17/2020  Ruben Murray [birth year] [id number]   Telephone call to patient for follow up. No answer. Unable to leave a message.   Plan: RN CM will attempt patient again in January.    Ruben Baseman, RN, MSN [place] Management Care Management Coordinator Direct Line [phone] Cell [phone] Toll Free: [phone]  Fax: [phone]

## 2020-12-31 DIAGNOSIS — Z7901 Long term (current) use of anticoagulants: Secondary | ICD-10-CM | POA: Diagnosis not present

## 2020-12-31 DIAGNOSIS — I4891 Unspecified atrial fibrillation: Secondary | ICD-10-CM | POA: Diagnosis not present

## 2021-01-03 ENCOUNTER — Ambulatory Visit: Payer: Medicare Other | Admitting: Podiatry

## 2021-01-26 ENCOUNTER — Ambulatory Visit (HOSPITAL_BASED_OUTPATIENT_CLINIC_OR_DEPARTMENT_OTHER): Payer: Medicare HMO | Admitting: General Practice

## 2021-01-26 ENCOUNTER — Ambulatory Visit: Payer: Medicare Other | Admitting: Podiatry

## 2021-02-01 ENCOUNTER — Ambulatory Visit: Payer: Medicare Other | Admitting: Podiatry

## 2021-02-24 ENCOUNTER — Other Ambulatory Visit: Payer: Self-pay

## 2021-02-24 NOTE — Patient Outreach (Signed)
[place] [facility]) Care Management  02/24/2021  Ruben Murray 06-11-1945 [id number]   Telephone call to patient for disease management follow up.   No answer.  HIPAA compliant voice message left.    Plan: If no return call, RN CM will attempt patient again in March.  [name], RN, MSN [place] Management Care Management Coordinator Direct Line [phone] Cell [phone] Toll Free: [phone]  Fax: [phone]

## 2021-03-18 ENCOUNTER — Ambulatory Visit: Payer: Self-pay

## 2021-03-28 DIAGNOSIS — Z7901 Long term (current) use of anticoagulants: Secondary | ICD-10-CM | POA: Diagnosis not present

## 2021-04-19 ENCOUNTER — Inpatient Hospital Stay (HOSPITAL_COMMUNITY)
Admission: EM | Admit: 2021-04-19 | Discharge: 2021-04-24 | DRG: 291 | Disposition: A | Discharge status: Home Health | Payer: Medicare PPO | Attending: Cardiovascular Disease | Admitting: Cardiovascular Disease

## 2021-04-19 ENCOUNTER — Encounter (HOSPITAL_COMMUNITY): Payer: Self-pay

## 2021-04-19 ENCOUNTER — Other Ambulatory Visit: Payer: Self-pay

## 2021-04-19 ENCOUNTER — Emergency Department (HOSPITAL_COMMUNITY): Payer: Medicare PPO

## 2021-04-19 DIAGNOSIS — Z7901 Long term (current) use of anticoagulants: Secondary | ICD-10-CM

## 2021-04-19 DIAGNOSIS — I110 Hypertensive heart disease with heart failure: Secondary | ICD-10-CM | POA: Diagnosis not present

## 2021-04-19 DIAGNOSIS — I10 Essential (primary) hypertension: Secondary | ICD-10-CM | POA: Diagnosis present

## 2021-04-19 DIAGNOSIS — I77810 Thoracic aortic ectasia: Secondary | ICD-10-CM

## 2021-04-19 DIAGNOSIS — I071 Rheumatic tricuspid insufficiency: Secondary | ICD-10-CM

## 2021-04-19 DIAGNOSIS — I4819 Other persistent atrial fibrillation: Secondary | ICD-10-CM | POA: Diagnosis not present

## 2021-04-19 DIAGNOSIS — I4891 Unspecified atrial fibrillation: Secondary | ICD-10-CM

## 2021-04-19 DIAGNOSIS — I4811 Longstanding persistent atrial fibrillation: Secondary | ICD-10-CM | POA: Diagnosis not present

## 2021-04-19 DIAGNOSIS — Z79899 Other long term (current) drug therapy: Secondary | ICD-10-CM | POA: Diagnosis not present

## 2021-04-19 DIAGNOSIS — R0602 Shortness of breath: Secondary | ICD-10-CM | POA: Diagnosis not present

## 2021-04-19 DIAGNOSIS — Z743 Need for continuous supervision: Secondary | ICD-10-CM | POA: Diagnosis not present

## 2021-04-19 DIAGNOSIS — E669 Obesity, unspecified: Secondary | ICD-10-CM | POA: Diagnosis present

## 2021-04-19 DIAGNOSIS — Z8249 Family history of ischemic heart disease and other diseases of the circulatory system: Secondary | ICD-10-CM | POA: Diagnosis not present

## 2021-04-19 DIAGNOSIS — R000 Tachycardia, unspecified: Secondary | ICD-10-CM | POA: Diagnosis not present

## 2021-04-19 DIAGNOSIS — M79605 Pain in left leg: Secondary | ICD-10-CM | POA: Diagnosis not present

## 2021-04-19 DIAGNOSIS — I5043 Acute on chronic combined systolic (congestive) and diastolic (congestive) heart failure: Secondary | ICD-10-CM | POA: Diagnosis not present

## 2021-04-19 DIAGNOSIS — I700 Atherosclerosis of aorta: Secondary | ICD-10-CM | POA: Diagnosis present

## 2021-04-19 DIAGNOSIS — I5023 Acute on chronic systolic (congestive) heart failure: Secondary | ICD-10-CM | POA: Diagnosis not present

## 2021-04-19 DIAGNOSIS — I499 Cardiac arrhythmia, unspecified: Secondary | ICD-10-CM | POA: Diagnosis not present

## 2021-04-19 DIAGNOSIS — E785 Hyperlipidemia, unspecified: Secondary | ICD-10-CM | POA: Diagnosis present

## 2021-04-19 DIAGNOSIS — Z8616 Personal history of COVID-19: Secondary | ICD-10-CM | POA: Diagnosis not present

## 2021-04-19 DIAGNOSIS — I081 Rheumatic disorders of both mitral and tricuspid valves: Secondary | ICD-10-CM | POA: Diagnosis present

## 2021-04-19 DIAGNOSIS — Z6836 Body mass index (BMI) 36.0-36.9, adult: Secondary | ICD-10-CM

## 2021-04-19 DIAGNOSIS — I4510 Unspecified right bundle-branch block: Secondary | ICD-10-CM | POA: Diagnosis present

## 2021-04-19 DIAGNOSIS — R600 Localized edema: Secondary | ICD-10-CM

## 2021-04-19 DIAGNOSIS — J449 Chronic obstructive pulmonary disease, unspecified: Secondary | ICD-10-CM | POA: Diagnosis not present

## 2021-04-19 DIAGNOSIS — I4821 Permanent atrial fibrillation: Secondary | ICD-10-CM | POA: Diagnosis present

## 2021-04-19 DIAGNOSIS — Z87891 Personal history of nicotine dependence: Secondary | ICD-10-CM | POA: Diagnosis not present

## 2021-04-19 DIAGNOSIS — G4733 Obstructive sleep apnea (adult) (pediatric): Secondary | ICD-10-CM | POA: Diagnosis not present

## 2021-04-19 DIAGNOSIS — Z96652 Presence of left artificial knee joint: Secondary | ICD-10-CM | POA: Diagnosis present

## 2021-04-19 DIAGNOSIS — Z20822 Contact with and (suspected) exposure to covid-19: Secondary | ICD-10-CM | POA: Diagnosis not present

## 2021-04-19 DIAGNOSIS — Z885 Allergy status to narcotic agent status: Secondary | ICD-10-CM

## 2021-04-19 DIAGNOSIS — Z888 Allergy status to other drugs, medicaments and biological substances status: Secondary | ICD-10-CM

## 2021-04-19 DIAGNOSIS — H903 Sensorineural hearing loss, bilateral: Secondary | ICD-10-CM | POA: Diagnosis not present

## 2021-04-19 DIAGNOSIS — I420 Dilated cardiomyopathy: Secondary | ICD-10-CM | POA: Diagnosis present

## 2021-04-19 DIAGNOSIS — R569 Unspecified convulsions: Secondary | ICD-10-CM | POA: Diagnosis present

## 2021-04-19 DIAGNOSIS — R404 Transient alteration of awareness: Secondary | ICD-10-CM | POA: Diagnosis not present

## 2021-04-19 DIAGNOSIS — R059 Cough, unspecified: Secondary | ICD-10-CM | POA: Diagnosis not present

## 2021-04-19 DIAGNOSIS — M79672 Pain in left foot: Secondary | ICD-10-CM | POA: Diagnosis not present

## 2021-04-19 DIAGNOSIS — I5021 Acute systolic (congestive) heart failure: Secondary | ICD-10-CM | POA: Diagnosis not present

## 2021-04-19 DIAGNOSIS — I4892 Unspecified atrial flutter: Secondary | ICD-10-CM | POA: Diagnosis present

## 2021-04-19 DIAGNOSIS — I340 Nonrheumatic mitral (valve) insufficiency: Secondary | ICD-10-CM

## 2021-04-19 DIAGNOSIS — M25572 Pain in left ankle and joints of left foot: Secondary | ICD-10-CM | POA: Diagnosis not present

## 2021-04-19 HISTORY — DX: Nonrheumatic mitral (valve) insufficiency: I34.0

## 2021-04-19 HISTORY — DX: Chronic systolic (congestive) heart failure: I50.22

## 2021-04-19 HISTORY — DX: Rheumatic tricuspid insufficiency: I07.1

## 2021-04-19 HISTORY — DX: Permanent atrial fibrillation: I48.21

## 2021-04-19 HISTORY — DX: Thoracic aortic ectasia: I77.810

## 2021-04-19 HISTORY — DX: Other cardiomyopathies: I42.8

## 2021-04-19 LAB — COMPREHENSIVE METABOLIC PANEL
ALT: 25 U/L (ref 0–44)
AST: 33 U/L (ref 15–41)
Albumin: 3.5 g/dL (ref 3.5–5.0)
Alkaline Phosphatase: 132 U/L — ABNORMAL HIGH (ref 38–126)
Anion gap: 9 (ref 5–15)
BUN: 15 mg/dL (ref 8–23)
CO2: 24 mmol/L (ref 22–32)
Calcium: 8.3 mg/dL — ABNORMAL LOW (ref 8.9–10.3)
Chloride: 105 mmol/L (ref 98–111)
Creatinine, Ser: 0.98 mg/dL (ref 0.61–1.24)
GFR, Estimated: >60 mL/min (ref >60)
Glucose, Bld: 106 mg/dL — ABNORMAL HIGH (ref 70–99)
Potassium: 5.1 mmol/L (ref 3.5–5.1)
Sodium: 138 mmol/L (ref 135–145)
Total Bilirubin: 1.6 mg/dL — ABNORMAL HIGH (ref 0.3–1.2)
Total Protein: 6.2 g/dL — ABNORMAL LOW (ref 6.5–8.1)

## 2021-04-19 LAB — TROPONIN I (HIGH SENSITIVITY)
Troponin I (High Sensitivity): 5 ng/L (ref <18)
Troponin I (High Sensitivity): 5 ng/L (ref <18)

## 2021-04-19 LAB — CBC WITH DIFFERENTIAL/PLATELET
Abs Immature Granulocytes: 0.03 10*3/uL (ref 0.00–0.07)
Basophils Absolute: 0 10*3/uL (ref 0.0–0.1)
Basophils Relative: 0 %
Eosinophils Absolute: 0.1 10*3/uL (ref 0.0–0.5)
Eosinophils Relative: 1 %
HCT: 39.7 % (ref 39.0–52.0)
Hemoglobin: 12.7 g/dL — ABNORMAL LOW (ref 13.0–17.0)
Immature Granulocytes: 0 %
Lymphocytes Relative: 24 %
Lymphs Abs: 2.3 10*3/uL (ref 0.7–4.0)
MCH: 30.9 pg (ref 26.0–34.0)
MCHC: 32 g/dL (ref 30.0–36.0)
MCV: 96.6 fL (ref 80.0–100.0)
Monocytes Absolute: 0.7 10*3/uL (ref 0.1–1.0)
Monocytes Relative: 7 %
Neutro Abs: 6.5 10*3/uL (ref 1.7–7.7)
Neutrophils Relative %: 68 %
Platelets: 172 10*3/uL (ref 150–400)
RBC: 4.11 MIL/uL — ABNORMAL LOW (ref 4.22–5.81)
RDW: 15.1 % (ref 11.5–15.5)
WBC: 9.8 10*3/uL (ref 4.0–10.5)
nRBC: 0 % (ref 0.0–0.2)

## 2021-04-19 LAB — URINALYSIS, ROUTINE W REFLEX MICROSCOPIC
Bilirubin Urine: NEGATIVE
Glucose, UA: NEGATIVE mg/dL
Hgb urine dipstick: NEGATIVE
Ketones, ur: NEGATIVE mg/dL
Leukocytes,Ua: NEGATIVE
Nitrite: NEGATIVE
Protein, ur: 30 mg/dL — AB
Specific Gravity, Urine: 1.023 (ref 1.005–1.030)
pH: 5 (ref 5.0–8.0)

## 2021-04-19 LAB — TSH: TSH: 1.898 u[IU]/mL (ref 0.350–4.500)

## 2021-04-19 LAB — RESP PANEL BY RT-PCR (FLU A&B, COVID) ARPGX2
Influenza A by PCR: NEGATIVE
Influenza B by PCR: NEGATIVE
SARS Coronavirus 2 by RT PCR: NEGATIVE

## 2021-04-19 LAB — PROTIME-INR
INR: 2.2 — ABNORMAL HIGH (ref 0.8–1.2)
Prothrombin Time: 24.6 seconds — ABNORMAL HIGH (ref 11.4–15.2)

## 2021-04-19 LAB — MAGNESIUM: Magnesium: 1.9 mg/dL (ref 1.7–2.4)

## 2021-04-19 LAB — BRAIN NATRIURETIC PEPTIDE: B Natriuretic Peptide: 542.1 pg/mL — ABNORMAL HIGH (ref 0.0–100.0)

## 2021-04-19 LAB — DIGOXIN LEVEL: Digoxin Level: <0.2 ng/mL — ABNORMAL LOW (ref 0.8–2.0)

## 2021-04-19 MED ORDER — WARFARIN SODIUM 5 MG PO TABS
5.0000 mg | ORAL_TABLET | Freq: Once | ORAL | Status: AC
  Administered 2021-04-19: 5 mg via ORAL
  Filled 2021-04-19: qty 1

## 2021-04-19 MED ORDER — PRAVASTATIN SODIUM 40 MG PO TABS
40.0000 mg | ORAL_TABLET | Freq: Every day | ORAL | Status: DC
  Administered 2021-04-19 – 2021-04-23 (×5): 40 mg via ORAL
  Filled 2021-04-19 (×5): qty 1

## 2021-04-19 MED ORDER — PHENOBARBITAL 32.4 MG PO TABS
97.2000 mg | ORAL_TABLET | Freq: Every day | ORAL | Status: DC
  Administered 2021-04-19 – 2021-04-23 (×5): 97.2 mg via ORAL
  Filled 2021-04-19 (×2): qty 3
  Filled 2021-04-19: qty 6
  Filled 2021-04-19 (×2): qty 3

## 2021-04-19 MED ORDER — AMIODARONE HCL 200 MG PO TABS
200.0000 mg | ORAL_TABLET | Freq: Two times a day (BID) | ORAL | Status: DC
  Administered 2021-04-19 – 2021-04-23 (×8): 200 mg via ORAL
  Filled 2021-04-19 (×8): qty 1

## 2021-04-19 MED ORDER — ACETAMINOPHEN 325 MG PO TABS
650.0000 mg | ORAL_TABLET | ORAL | Status: DC | PRN
  Administered 2021-04-21 (×3): 650 mg via ORAL
  Filled 2021-04-19 (×3): qty 2

## 2021-04-19 MED ORDER — DIGOXIN 125 MCG PO TABS
0.1250 mg | ORAL_TABLET | Freq: Every day | ORAL | Status: DC
  Administered 2021-04-19 – 2021-04-24 (×6): 0.125 mg via ORAL
  Filled 2021-04-19 (×6): qty 1

## 2021-04-19 MED ORDER — METOPROLOL SUCCINATE ER 100 MG PO TB24
100.0000 mg | ORAL_TABLET | Freq: Every day | ORAL | Status: DC
  Administered 2021-04-19 – 2021-04-24 (×6): 100 mg via ORAL
  Filled 2021-04-19 (×3): qty 1
  Filled 2021-04-19: qty 4
  Filled 2021-04-19 (×2): qty 1

## 2021-04-19 MED ORDER — WARFARIN - PHARMACIST DOSING INPATIENT: Freq: Every day | Status: DC

## 2021-04-19 NOTE — ED Provider Notes (Signed)
[facility] EMERGENCY DEPARTMENT Provider Note   CSN: [id number] Arrival date & time: 04/19/21  1102     History  No chief complaint on file.   Ruben Murray is a 76 y.o. male.  The history is provided by the patient, the EMS personnel and medical records. No language interpreter was used.  Shortness of Breath Severity:  Moderate Onset quality:  Gradual Duration:  4 days Timing:  Intermittent Progression:  Waxing and waning Chronicity:  New Relieved by:  Nothing Worsened by:  Exertion Ineffective treatments:  None tried Associated symptoms: cough   Associated symptoms: no abdominal pain, no chest pain, no diaphoresis, no fever, no headaches, no hemoptysis, no neck pain, no rash, no sputum production, no vomiting and no wheezing   Risk factors: no hx of PE/DVT       Home Medications Prior to Admission medications   Medication Sig Start Date End Date Taking? Authorizing Provider  acetaminophen (TYLENOL) 500 MG tablet Take 500-1,000 mg by mouth daily as needed for moderate pain.     [provider]  amiodarone (PACERONE) 200 MG tablet Take 1 tablet (200 mg total) by mouth daily. 05/13/19   Camnitz, Will Hassell Done, MD  apixaban (ELIQUIS) 5 MG TABS tablet Eliquis 5 mg tablet  TAKE 1 TABLET BY MOUTH TWICE DAILY    [provider]  ARTIFICIAL TEAR SOLUTION OP Place 1 drop into both eyes 2 (two) times daily.    [provider]  benzonatate (TESSALON) 200 MG capsule Take 200 mg by mouth 3 (three) times daily as needed. 10/27/19   [provider]  cephALEXin (KEFLEX) 500 MG capsule cephalexin 500 mg capsule  TAKE 1 CAPSULE BY MOUTH 4 TIMES DAILY FOR 7 DAYS    [provider]  ciclopirox (LOPROX) 0.77 % cream ciclopirox 0.77 % topical cream  APPLY TOPICALLY TWICE DAILY FOR 30 DAYS    [provider]  digoxin (LANOXIN) 0.125 MG tablet Take 1 tablet (0.125 mg total) by mouth daily. 05/06/20   [name], MD   doxycycline (VIBRAMYCIN) 100 MG capsule Take 100 mg by mouth 2 (two) times daily. 12/02/19   [provider]  famotidine (PEPCID) 20 MG tablet One at bedtime 08/02/16   Tanda Rockers, MD  finasteride (PROSCAR) 5 MG tablet Take 5 mg by mouth daily at 12 noon.     [provider]  fluticasone (FLONASE) 50 MCG/ACT nasal spray Place 2 sprays into both nostrils 2 (two) times daily. 10/24/17   [provider]  furosemide (LASIX) 40 MG tablet Take 1 tablet (40 mg total) by mouth daily. 12/13/19 03/12/20  Terrilee Croak, MD  losartan (COZAAR) 50 MG tablet Take 1 tablet (50 mg total) by mouth daily. 08/25/20   [name], MD  methylPREDNISolone (MEDROL DOSEPAK) 4 MG TBPK tablet 6 day dose pack - take as directed 01/28/20   Criselda Peaches, DPM  metoprolol succinate (TOPROL-XL) 100 MG 24 hr tablet Take 1 tablet (100 mg total) by mouth daily. Take with or immediately following a meal. 12/13/19 03/12/20  Dahal, Marlowe Aschoff, MD  Multiple Vitamin (MULTIVITAMIN WITH MINERALS) TABS tablet Take 1 tablet by mouth daily at 12 noon.    [provider]  PHENobarbital (LUMINAL) 97.2 MG tablet Take 97.2 mg by mouth at bedtime.     [provider]  polyvinyl alcohol (LIQUIFILM TEARS) 1.4 % ophthalmic solution Place 1 drop into both eyes daily with lunch.    [provider]  potassium chloride (KLOR-CON) 10 MEQ tablet potassium chloride ER 10 mEq tablet,extended release(part/cryst)  TAKE 1 TABLET BY MOUTH ONCE DAILY    [provider]  pravastatin (PRAVACHOL) 40 MG tablet Take 40 mg by mouth at bedtime.     [provider]  [facility] injection  10/13/19   [provider]  warfarin (COUMADIN) 3 MG tablet Take 3 mg by mouth See admin instructions. Mon,Tues,Wed,Thurs,Fri,Sat only    [provider]      Allergies    Dilaudid [hydromorphone hcl], Dilaudid [hydromorphone], Tegretol [carbamazepine], and Tegretol [carbamazepine]    Review of  Systems   Review of Systems  Constitutional:  Positive for fatigue. Negative for chills, diaphoresis and fever.  HENT:  Negative for congestion.   Eyes:  Negative for visual disturbance.  Respiratory:  Positive for cough and shortness of breath. Negative for hemoptysis, sputum production, chest tightness and wheezing.   Cardiovascular:  Positive for palpitations (feels heart skipping beats at times) and leg swelling (unchanged from baseline by report). Negative for chest pain.  Gastrointestinal:  Negative for abdominal pain, constipation, diarrhea, nausea and vomiting.  Genitourinary:  Negative for dysuria and flank pain.  Musculoskeletal:  Negative for arthralgias, back pain, neck pain and neck stiffness.  Skin:  Negative for rash and wound.  Neurological:  Positive for light-headedness. Negative for dizziness, syncope and headaches.  Psychiatric/Behavioral:  Negative for agitation and confusion.   All other systems reviewed and are negative.  Physical Exam Updated Vital Signs BP 117/72 (BP Location: Right Arm)    Pulse (!) 117    Temp 97.6 F (36.4 C) (Oral)    Resp (!) 22    Ht _0  (1.727 m)    Wt 108.9 kg    SpO2 95%    BMI 36.49 kg/m  Physical Exam Vitals and nursing note reviewed.  Constitutional:      General: He is not in acute distress.    Appearance: He is well-developed. He is not ill-appearing, toxic-appearing or diaphoretic.  HENT:     Head: Normocephalic and atraumatic.     Mouth/Throat:     Mouth: Mucous membranes are moist.     Pharynx: No oropharyngeal exudate or posterior oropharyngeal erythema.  Eyes:     Extraocular Movements: Extraocular movements intact.     Conjunctiva/sclera: Conjunctivae normal.     Pupils: Pupils are equal, round, and reactive to light.  Cardiovascular:     Rate and Rhythm: Tachycardia present. Rhythm irregular.     Heart sounds: No murmur heard. Pulmonary:     Effort: Pulmonary effort is normal. No respiratory distress.     Breath  sounds: Rales present. No wheezing or rhonchi.  Chest:     Chest wall: No tenderness.  Abdominal:     Palpations: Abdomen is soft.     Tenderness: There is no abdominal tenderness. There is no right CVA tenderness, left CVA tenderness, guarding or rebound.  Musculoskeletal:        General: No swelling or tenderness.     Cervical back: Neck supple. No tenderness.     Right lower leg: Edema present.     Left lower leg: Edema present.  Skin:    General: Skin is warm and dry.     Capillary Refill: Capillary refill takes less than 2 seconds.     Findings: No erythema.  Neurological:     General: No focal deficit present.     Mental Status: He is alert.     Sensory:  No sensory deficit.     Motor: No weakness.  Psychiatric:        Mood and Affect: Mood normal.    ED Results / Procedures / Treatments   Labs (all labs ordered are listed, but only abnormal results are displayed) Labs Reviewed  CBC WITH DIFFERENTIAL/PLATELET - Abnormal; Notable for the following components:      Result Value   RBC 4.11 (*)    Hemoglobin 12.7 (*)    All other components within normal limits  COMPREHENSIVE METABOLIC PANEL - Abnormal; Notable for the following components:   Glucose, Bld 106 (*)    Calcium 8.3 (*)    Total Protein 6.2 (*)    Alkaline Phosphatase 132 (*)    Total Bilirubin 1.6 (*)    All other components within normal limits  BRAIN NATRIURETIC PEPTIDE - Abnormal; Notable for the following components:   B Natriuretic Peptide 542.1 (*)    All other components within normal limits  PROTIME-INR - Abnormal; Notable for the following components:   Prothrombin Time 24.6 (*)    INR 2.2 (*)    All other components within normal limits  RESP PANEL BY RT-PCR (FLU A&B, COVID) ARPGX2  TSH  MAGNESIUM  URINALYSIS, ROUTINE W REFLEX MICROSCOPIC  TROPONIN I (HIGH SENSITIVITY)  TROPONIN I (HIGH SENSITIVITY)    EKG EKG Interpretation  Date/Time:  Tuesday April 19 2021 11:09:46  EST Ventricular Rate:  129 PR Interval:    QRS Duration: 139 QT Interval:  326 QTC Calculation: 478 R Axis:   67 Text Interpretation: Atrial fibrillation Ventricular premature complex Right bundle branch block when compared to prior, similar afib with more pvcs. NO STEMI Confirmed by Antony Blackbird [id number]) on 04/19/2021 11:32:08 AM  Radiology DG Chest 2 View  Result Date: 04/19/2021 CLINICAL DATA:  Atrial fibrillation with rapid ventricular response, mild cough, shortness of breath EXAM: CHEST - 2 VIEW COMPARISON:  12/09/2019 FINDINGS: Enlargement of cardiac silhouette. Mediastinal contours and pulmonary vascularity normal. Scarring at lung bases. No acute infiltrate, pleural effusion, or pneumothorax. Osseous structures unremarkable. IMPRESSION: Enlargement of cardiac silhouette with bibasilar scarring. No acute abnormalities. Electronically Signed   By: Lavonia Dana M.D.   On: 04/19/2021 12:00    Procedures Procedures    Medications Ordered in ED Medications - No data to display  ED Course/ Medical Decision Making/ A&P                           Medical Decision Making Amount and/or Complexity of Data Reviewed Labs: ordered. Radiology: ordered.    Ruben Murray is a 76 y.o. male with a past medical history significant for asthma, COPD, seizures, hypertension, hyperlipidemia, persistent atrial fibrillation on Coumadin therapy, CHF, and obesity who presents from PCP office for 4 days of worsening fatigue, exertional shortness of breath, and recurrent A-fib with RVR.  According to patient, for the last for 5 days he has had worsening fatigue.  He also said he had a mild dry cough.  No reported fevers or chills.  No nausea, vomiting, constipation, diarrhea, or urinary changes.  He is normally able to walk around and take care of his spouse but has had challenges doing that getting so tired and fatigued.  He also reports some shortness of breath when he walks around.  Denies any chest pain  and denies palpitations but does say he feels his heart skipping a beat occasionally.  He reports his legs do not feel  more edematous than prior but agrees they are slightly swollen.  He denies any syncopal episodes, falls, or trauma.  He does feel fatigued and lightheaded at times.  He called EMS yesterday but decided to wait to see his PCP today.  At PCP office, heart rate was reportedly very fast and EMS was called.  EMS reports A-fib with RVR with a rate between 100-1 80 intermittently.  Patient is still feeling tired and fatigued.  On my exam, heart rate is about 1 30-1 40.  It is showing A-fib with RVR.  Lungs had some crackles at the bases but otherwise I do not appreciate rhonchi.  Abdomen and chest nontender.  Good pulses in extremities.  Legs are edematous bilaterally which she reports is unchanged from baseline.  When I sat him up to listen to his lungs heart rate jumped to about 140.  Mucous membranes do not appear significantly dry.  Clinically I suspect patient is having fatigue due to the A-fib with RVR.  It is unclear if this is due to mild dehydration although he does not appear clinically dehydrated.  He had rales in his lungs and edema in the legs so we will hold on significant fluid resuscitation initially.  We will get labs including a BNP, will get troponin due to the exertional shortness of breath.  Will get work-up to look for pneumonia or other infectious cause of his symptoms and will get a magnesium level and TSH as well.  Anticipate discussion with cardiology to determine management and disposition given persistent A-fib with worsened rate and RVR today leading to significant fatigue and exertional shortness of breath.   Cardiology will come evaluate patient and help determine disposition.  When he tried to get up he did have a rate jumped to the 130s and got short of breath and fatigue.  We will await further recommendations   Care transferred to oncoming team to await  cardiology recs.         Final Clinical Impression(s) / ED Diagnoses Final diagnoses:  Atrial fibrillation with RVR (HCC)  Exertional shortness of breath     Clinical Impression: 1. Atrial fibrillation with RVR ([place])   2. Exertional shortness of breath     Disposition: Care transferred to oncoming team to await cardiology recs.  This note was prepared with assistance of Systems analyst. Occasional wrong-word or sound-a-like substitutions may have occurred due to the inherent limitations of voice recognition software.      Mariluz Crespo, Gwenyth Allegra, MD 04/19/21 1500

## 2021-04-19 NOTE — H&P (Addendum)
Cardiology History and Physical:   Patient ID: Ruben Murray MRN: [id number]; DOB: Jan 27, 1946   Admission date: 04/19/2021  PCP:  [name], MD   [facility] Providers Cardiologist:  [name], MD  Electrophysiologist:  [name], MD  {  Chief Complaint:  Atrial Fibrillation  Patient Profile:   Ruben Murray is a 76 y.o. male with systolic CHF, hypertension, OSA, seizure, aortic atherosclerosis, COPD, hypertension, persistent atrial fibrillation who is being seen 04/19/2021 for the evaluation of atrial fibrillation/dyspnea.  History of Present Illness:   Ruben Murray is a 76 year old male who has been followed by Dr. [name] as an outpatient.  In January 2020 he was hospitalized for community-acquired pneumonia and found to be in new onset atrial fibrillation.  He failed initial cardioversion 09/2018.  Then had repeat cardioversion in 10/2018 after being loaded with amiodarone.   He was admitted 10/2019 and seen by cardiology for evaluation of atrial flutter.  Admitted for left lower leg cellulitis as well as COVID during that admission.  Echocardiogram showed an LVEF of 20 to 25% with mildly dilated LA and moderately dilated RA with no valvular disease.  Given drop in EF plan was made for right and left cardiac catheterization.  Underwent cardiac cath 11/2019 with no obstructive coronary disease, mild pulmonary hypertension with PA pressure of 34/22 with mean PA pressure 28 mmHg.  Consistent with nonischemic cardiomyopathy.  He was placed on guideline directed medical therapy with Entresto, digoxin, Lasix and metoprolol.  Presented back to the ED with persistent cellulitis and admitted for antibiotic therapy on 11/2019.  He was diuresed with IV Lasix.  He was seen in the office on 01/2020 with Kathyrn Drown where there was confusion regarding his home medications.  He was instructed to continue on Lasix 40 mg daily, metoprolol, Entresto, digoxin and amiodarone, as well as  coumadin.  Last seen in the office on 03/2020 with Dr. [name] for follow-up of his atrial fibrillation.  As noted he had undergone several cardioversions in the past with failure to maintain sinus rhythm.  He had also been seen in the A-fib clinic as well.  He was scheduled for cardioversion that Friday following office visit.  It was noted that he had been seen by Dr. Curt Bears and offered the option of ablation if failed repeat cardioversion.  He was continued on amiodarone 200 mg twice daily as well as Coumadin.  Presented to the ED on 2/14 with complaints of ongoing dyspnea, orthopnea and palpitations.  In talking with patient he reports that he has been the primary caregiver for his wife who has dementia and broke her hip this past year and has been in rehab and recently discharged back home.  They have also been in the process of moving.  He has been under quite a bit of stress.  States he has noted increasing dyspnea on exertion for several weeks now.  States Saturday symptoms worsened and continued into Sunday.  Went to his PCP office today for his complaints and was noted to be in A-fib with RVR therefore was recommended that he be evaluated in the ED.  Labs in the ED showed sodium 138, potassium 5.1, creatinine 0.9, alk phos 132, total protein 6.2, total bilirubin 1.6, BNP 542, high-sensitivity troponin 5>>5, WBC 9.8, hemoglobin 12.7, INR 2.2, TSH 1.8.  EKG showed atrial fibrillation with RVR, 129 bpm.  Chest x-ray without overt edema.  In review of his telemetry his heart rates are noted to be labile between  100 to 160 bpm.  Cardiology asked to evaluate.   Past Medical History:  Diagnosis Date   Acquired thrombophilia ([place]) 01/15/2019   Aortic atherosclerosis ([place])    Atrial fibrillation with RVR ([place]) 10/29/2017   CAP (community acquired pneumonia) 03/06/2016   Cardiomyopathy ([place])    a. EF 40-45% in 04/2016, etiology not defined, managed medically.   CHF exacerbation ([place]) 10/29/2017    Chronic laryngitis 10/03/2016   COPD GOLD II  08/02/2016   Quit smoking 2000  PFT's  07/10/2016  FEV1 1.98 (70 % ) ratio 67  p 19 % improvement from saba p nothing  prior to study while of coreg so rec as of 08/02/2016 try off coreg and on bisoprolol      Dysphonia 10/03/2016   Essential hypertension 03/06/2016   Changed from coreg to bisoprolol due to copd with reversible component  08/02/2016 >>>    History of cardioversion    Hoarseness 08/31/2016   Referred to ent 08/31/2016 >>> seen 10/03/16 [name] dx gerd and rhinitis medicamentosa   > improved on f/u 01/31/17 on gerd rx/ flonase and off afrin   Hyperlipidemia    Hypertension    Laryngopharyngeal reflux (LPR) 10/03/2016   Moderate persistent asthma 08/02/2016   Morbid (severe) obesity due to excess calories ([place]) 08/02/2016   Myocardial infarction ([place]) 1980-early 2000s X 2   "mild ones" (03/06/2016)   Nasal polyps 10/03/2016   OSA on CPAP    a. pt states was told by Dr. Maxwell Caul that he would not require CPAP as long as he continued to sleep in recliner which he does for his back.   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   Rhinitis medicamentosa 10/03/2016   Right thyroid nodule 03/06/2016   Seizures ([place])    "take RX daily" (03/06/2016)   Sensorineural hearing loss (SNHL) of both ears 01/31/2017    Past Surgical History:  Procedure Laterality Date   CARDIOVERSION N/A 12/13/2017   Procedure: CARDIOVERSION;  Surgeon: [name], MD;  Location: [place];  Service: Cardiovascular;  Laterality: N/A;   CARDIOVERSION N/A 09/25/2018   Procedure: CARDIOVERSION;  Surgeon: [name], MD;  Location: [facility] ENDOSCOPY;  Service: Cardiovascular;  Laterality: N/A;   CARDIOVERSION N/A 11/04/2018   Procedure: CARDIOVERSION;  Surgeon: [name], MD;  [facility] ENDOSCOPY;  Service: Cardiovascular;  Laterality: N/A;    CARDIOVERSION N/A 04/11/2019   Procedure: CARDIOVERSION;  Surgeon: [name], MD;  Location: [facility] ENDOSCOPY;  Service: Cardiovascular;  Laterality: N/A;   CIRCUMCISION     JOINT REPLACEMENT     PERCUTANEOUS PINNING TOE FRACTURE Left    "big toe   REPLACEMENT TOTAL KNEE Left    RIGHT/LEFT HEART CATH AND CORONARY ANGIOGRAPHY N/A 11/06/2019   Procedure: RIGHT/LEFT HEART CATH AND CORONARY ANGIOGRAPHY;  Surgeon: [name], MD;  Location: [place];  Service: Cardiovascular;  Laterality: N/A;   TONSILLECTOMY AND ADENOIDECTOMY      Medications Prior to Admission: Prior to Admission medications   Medication Sig Start Date End Date Taking? Authorizing Provider  acetaminophen (TYLENOL) 500 MG tablet Take 500-1,000 mg by mouth daily as needed for moderate pain.     [provider]  amiodarone (PACERONE) 200 MG tablet Take 1 tablet (200 mg total) by mouth daily. 05/13/19   Camnitz, Ocie Doyne, MD  apixaban (ELIQUIS) 5 MG TABS tablet Eliquis 5 mg tablet  TAKE 1 TABLET BY MOUTH TWICE DAILY    [provider]  ARTIFICIAL TEAR SOLUTION OP Place 1 drop into both eyes 2 (two) times daily.    [provider]  benzonatate (TESSALON) 200 MG capsule Take 200 mg by mouth 3 (three) times daily as needed. 10/27/19   [provider]  cephALEXin (KEFLEX) 500 MG capsule cephalexin 500 mg capsule  TAKE 1 CAPSULE BY MOUTH 4 TIMES DAILY FOR 7 DAYS    [provider]  ciclopirox (LOPROX) 0.77 % cream ciclopirox 0.77 % topical cream  APPLY TOPICALLY TWICE DAILY FOR 30 DAYS    [provider]  digoxin (LANOXIN) 0.125 MG tablet Take 1 tablet (0.125 mg total) by mouth daily. 05/06/20   [name], MD  doxycycline (VIBRAMYCIN) 100 MG capsule Take 100 mg by mouth 2 (two) times daily. 12/02/19   [provider]  famotidine (PEPCID) 20 MG tablet One at bedtime 08/02/16   Tanda Rockers, MD  finasteride (PROSCAR) 5 MG tablet Take 5 mg by mouth daily at  12 noon.     [provider]  fluticasone (FLONASE) 50 MCG/ACT nasal spray Place 2 sprays into both nostrils 2 (two) times daily. 10/24/17   [provider]  furosemide (LASIX) 40 MG tablet Take 1 tablet (40 mg total) by mouth daily. 12/13/19 03/12/20  Terrilee Croak, MD  losartan (COZAAR) 50 MG tablet Take 1 tablet (50 mg total) by mouth daily. 08/25/20   [name], MD  methylPREDNISolone (MEDROL DOSEPAK) 4 MG TBPK tablet 6 day dose pack - take as directed 01/28/20   Criselda Peaches, DPM  metoprolol succinate (TOPROL-XL) 100 MG 24 hr tablet Take 1 tablet (100 mg total) by mouth daily. Take with or immediately following a meal. 12/13/19 03/12/20  Dahal, Marlowe Aschoff, MD  Multiple Vitamin (MULTIVITAMIN WITH MINERALS) TABS tablet Take 1 tablet by mouth daily at 12 noon.    [provider]  PHENobarbital (LUMINAL) 97.2 MG tablet Take 97.2 mg by mouth at bedtime.     [provider]  polyvinyl alcohol (LIQUIFILM TEARS) 1.4 % ophthalmic solution Place 1 drop into both eyes daily with lunch.    [provider]  potassium chloride (KLOR-CON) 10 MEQ tablet potassium chloride ER 10 mEq tablet,extended release(part/cryst)  TAKE 1 TABLET BY MOUTH ONCE DAILY    [provider]  pravastatin (PRAVACHOL) 40 MG tablet Take 40 mg by mouth at bedtime.     [provider]  [facility] injection  10/13/19   [provider]  warfarin (COUMADIN) 3 MG tablet Take 3 mg by mouth See admin instructions. Mon,Tues,Wed,Thurs,Fri,Sat only    [provider]     Allergies:    Allergies  Allergen Reactions   Dilaudid [Hydromorphone Hcl] Other (See Comments)    Makes pt hyper    Dilaudid [Hydromorphone] Other (See Comments)    hyperactiviity   Tegretol [Carbamazepine] Hives   Tegretol [Carbamazepine] Hives    Social History:   Social History   Socioeconomic History   Marital status: Married    Spouse name: Not on file   Number of children: Not on  file   Years of education: Not on file   Highest education level: Not on file  Occupational History   Not on file  Tobacco Use   Smoking status: Former    Packs/day: 3.00    Years: 48.00    Pack years:  144.00    Types: Cigarettes    Quit date: 2000    Years since quitting: 23.1   Smokeless tobacco: Never  Vaping Use   Vaping Use: Never used  Substance and Sexual Activity   Alcohol use: Never   Drug use: Never   Sexual activity: Never  Other Topics Concern   Not on file  Social History Narrative   ** Merged History Encounter **       Social Determinants of Health   Financial Resource Strain: Not on file  Food Insecurity: Not on file  Transportation Needs: Not on file  Physical Activity: Not on file  Stress: Not on file  Social Connections: Not on file  Intimate Partner Violence: Not on file    Family History:   The patient's family history includes Hypertension in his mother; Other in his father.    ROS:  Please see the history of present illness.  All other ROS reviewed and negative.     Physical Exam/Data:   Vitals:   04/19/21 1515 04/19/21 1530 04/19/21 1545 04/19/21 1600  BP: (!) 118/100 (!) 145/127 117/82 121/84  Pulse: (!) 104 61 (!) 108 (!) 116  Resp: 13 (!) _0 Temp:      TempSrc:      SpO2: 95% 91% 98% 95%  Weight:      Height:       No intake or output data in the 24 hours ending 04/19/21 1625 Last 3 Weights 04/19/2021 03/16/2020 01/27/2020  Weight (lbs) 240 lb 234 lb 251 lb  Weight (kg) 108.863 kg 106.142 kg 113.853 kg     Body mass index is 36.49 kg/m.  General:  Well nourished, well developed, in no acute distress HEENT: normal Neck: + JVD Vascular: No carotid bruits; Distal pulses 2+ bilaterally   Cardiac:  normal S1, S2; irregularly irregular; no murmur  Lungs:  clear to auscultation bilaterally, no wheezing, rhonchi or rales  Abd: soft, nontender, no hepatomegaly  Ext: 1-2+ LE edema Musculoskeletal:  No deformities, BUE and BLE  strength normal and equal Skin: warm and dry  Neuro:  CNs 2-12 intact, no focal abnormalities noted Psych:  Normal affect    EKG:  The ECG that was done 2/14 was personally reviewed and demonstrates atrial fibrillation, RVR, 128 bpm  Relevant CV Studies:  Echo: 10/2019  IMPRESSIONS     1. Left ventricular ejection fraction, by estimation, is 20 to 25%. The  left ventricle has severely decreased function. The left ventricle  demonstrates global hypokinesis. The left ventricular internal cavity size  was moderately dilated. Left  ventricular diastolic parameters are indeterminate.   2. Right ventricular systolic function is moderately reduced. The right  ventricular size is moderately enlarged. There is mildly elevated  pulmonary artery systolic pressure. The estimated right ventricular  systolic pressure is 71.9 mmHg.   3. Left atrial size was mildly dilated.   4. Right atrial size was moderately dilated.   5. The mitral valve is normal in structure. Mild mitral valve  regurgitation. No evidence of mitral stenosis.   6. The aortic valve is tricuspid. Aortic valve regurgitation is mild. No  aortic stenosis is present.   7. The inferior vena cava is dilated in size with <50% respiratory  variability, suggesting right atrial pressure of 15 mmHg.   FINDINGS   Left Ventricle: Left ventricular ejection fraction, by estimation, is 20  to 25%. The left ventricle has severely decreased function. The left  ventricle demonstrates global  hypokinesis. Definity contrast agent was  given IV to delineate the left  ventricular endocardial borders. The left ventricular internal cavity size  was moderately dilated. There is no left ventricular hypertrophy. Left  ventricular diastolic parameters are indeterminate.   Right Ventricle: The right ventricular size is moderately enlarged. No  increase in right ventricular wall thickness. Right ventricular systolic  function is moderately reduced.  There is mildly elevated pulmonary artery  systolic pressure. The tricuspid  regurgitant velocity is 2.40 m/s, and with an assumed right atrial  pressure of 15 mmHg, the estimated right ventricular systolic pressure is  92.1 mmHg.   Left Atrium: Left atrial size was mildly dilated.   Right Atrium: Right atrial size was moderately dilated.   Pericardium: There is no evidence of pericardial effusion.   Mitral Valve: The mitral valve is normal in structure. Mild mitral annular  calcification. Mild mitral valve regurgitation. No evidence of mitral  valve stenosis.   Tricuspid Valve: The tricuspid valve is normal in structure. Tricuspid  valve regurgitation is mild.   Aortic Valve: The aortic valve is tricuspid. Aortic valve regurgitation is  mild. Aortic regurgitation PHT measures 855 msec. No aortic stenosis is  present.   Pulmonic Valve: The pulmonic valve was normal in structure. Pulmonic valve  regurgitation is trivial.   Aorta: The aortic root is normal in size and structure.   Venous: The inferior vena cava is dilated in size with less than 50%  respiratory variability, suggesting right atrial pressure of 15 mmHg.   IAS/Shunts: No atrial level shunt detected by color flow Doppler.   R/LHC: 11/2019  Findings are consistent with a dilated nonischemic cardiomyopathy.   Normal coronary arteries with a codominant system.   Mild right heart pressure elevation with mild pulmonary hypertension; PA 34/22, mean PA pressure 28 mm.   Atrial flutter with variable block and rapid ventricular response.   RECOMMENDATION:  Guideline directed medical therapy for nonischemic cardiomyopathy.  In this patient with recent Covid 19 positivity consider post Covid myocarditis.  Laboratory Data:  High Sensitivity Troponin:   Recent Labs  Lab 04/19/21 1124 04/19/21 1337  TROPONINIHS 5 5      Chemistry Recent Labs  Lab 04/19/21 1124  NA 138  K 5.1  CL 105  CO2 24  GLUCOSE 106*   BUN 15  CREATININE 0.98  CALCIUM 8.3*  MG 1.9  GFRNONAA >60  ANIONGAP 9    Recent Labs  Lab 04/19/21 1124  PROT 6.2*  ALBUMIN 3.5  AST 33  ALT 25  ALKPHOS 132*  BILITOT 1.6*   Lipids No results for input(s): CHOL, TRIG, HDL, LABVLDL, LDLCALC, CHOLHDL in the last 168 hours. Hematology Recent Labs  Lab 04/19/21 1124  WBC 9.8  RBC 4.11*  HGB 12.7*  HCT 39.7  MCV 96.6  MCH 30.9  MCHC 32.0  RDW 15.1  PLT 172   Thyroid  Recent Labs  Lab 04/19/21 1154  TSH 1.898   BNP Recent Labs  Lab 04/19/21 1124  BNP 542.1*    DDimer No results for input(s): DDIMER in the last 168 hours.   Radiology/Studies:  DG Chest 2 View  Result Date: 04/19/2021 CLINICAL DATA:  Atrial fibrillation with rapid ventricular response, mild cough, shortness of breath EXAM: CHEST - 2 VIEW COMPARISON:  12/09/2019 FINDINGS: Enlargement of cardiac silhouette. Mediastinal contours and pulmonary vascularity normal. Scarring at lung bases. No acute infiltrate, pleural effusion, or pneumothorax. Osseous structures unremarkable. IMPRESSION: Enlargement of cardiac silhouette with bibasilar scarring. No  acute abnormalities. Electronically Signed   By: Lavonia Dana M.D.   On: 04/19/2021 12:00     Assessment and Plan:   Ruben Murray is a 76 y.o. male with systolic CHF, hypertension, OSA, seizure, aortic atherosclerosis, COPD, hypertension, persistent atrial fibrillation who is being seen 04/19/2021 for the evaluation of atrial fibrillation/dyspnea.  Persistent atrial fibrillation: He has had multiple cardioversions in the past and been unable to hold sinus rhythm.  Has been evaluated by EP, Dr. Curt Bears in the past and offered ablation if unable to maintain sinus rhythm. Suspect he may have been in afib since his visit in 03/2020, but unclear. Heart rate has been labile in the ED varying between 100- 160 bpm.  -- admit -- he has been on amiodarone 262m daily, will increase to 2011mBID for now -- continue  Toprol XL 10046maily, along with dig 0.125m3mily -- check dig level -- coumadin per Pharmacy  HFrEF/NICM: Known history of LVEF of 20 to 25% based on echocardiogram 10/2019.  Does report chronic lower extremity edema, but has recently had orthopnea and PND.  BNP elevated 542.  -- Start IV Lasix 40mg34mly -- check echo -- continue Toprol XL 100mg 27my  HTN: blood pressures are stable, but softer -- continue Toprol XL 100mg d84m  -- hold losartan   OSA: on Cpap  HLD: on statin    Risk Assessment/Risk Scores:     New York Heart Association (NYHA) Functional Class NYHA Class III  CHA2DS2-VASc Score = 6  This indicates a 9.7% annual risk of stroke. The patient's score is based upon: CHF History: 1 HTN History: 1 Diabetes History: 0 Stroke History: 0 Vascular Disease History: 0 Age Score: 2 Gender Score: 0    Severity of Illness: The appropriate patient status for this patient is OBSERVATION. Observation status is judged to be reasonable and necessary in order to provide the required intensity of service to ensure the patient's safety. The patient's presenting symptoms, physical exam findings, and initial radiographic and laboratory data in the context of their medical condition is felt to place them at decreased risk for further clinical deterioration. Furthermore, it is anticipated that the patient will be medically stable for discharge from the hospital within 2 midnights of admission.    For questions or updates, please contact [facility] consult [email or web address] for contact info under     Signed, LindsayReino Bellis/14/2023 4:25 PM    Patient seen and examined. Agree with assessment and plan.  Mr. CharlesCarlie Corpus5 year38ld gentleman who is followed by Dr. Mark SkCandee Furbishimary cardiology care.  He has a history of hypertension, persistent atrial fibrillation, OSA, and previously had undergone right and left heart catheterization and was found to  have normal coronary arteries.  His last echo Doppler study was in 2021 and he was admitted for evaluation of atrial flutter and had left lower leg cellulitis as well as COVID.  EF was 20 to 25% with mild [place] dilated LA and moderate dilatation of his RA without significant valvular disease.  It was felt he had a nonischemic cardiomyopathy and was started on guideline directed medical therapy with Entresto, digoxin, Lasix, metoprolol succinate.  He has not seen Dr. Skains Marlou PorchJanuary 2022.  Recently when planning a move, his wife had fallen and broke her hip and has been in rehab.  He has been working hard at home doing the all the unpacking himself.  Over the last several weeks  he has noticed progressive increasing shortness of breath.  He presented to his primary care office today and was found to be in atrial fibrillation with increased ventricular rate and hospitalization was advised.  Presently, he is telemetry shows atrial fibrillation with ventricular rate in the 115 to mid 120 range.  He is not having any chest pain.  Oxygen saturation is 95%.  There is no JVD.  Lungs are relatively clear without wheezing.  Rhythm is irregularly irregular consistent with atrial fibrillation.  There is a 1/6 systolic murmur.  Abdomen is nontender.  There is no hepatosplenomegaly.  There is 1+ bilateral lower extremity edema.  Neurologic exam is grossly normal.  ECG shows atrial fibrillation with ventricular rate at 128 bpm.  Chest x-ray shows enlargement of the cardiac silhouette with bibasilar scarring without acute abnormalities.   The patient has been on amiodarone 200 mg daily.  With his continued AF we will increase to 200 mg twice a day.  Plan to obtain repeat echo Doppler study for reassessment of LV function.  I suspect if he has been in persistent atrial fibrillation for some time atrial may be more significantly enlarged.  Plan to check digoxin level.  He has been on warfarin for anticoagulation.  With initial BNP  elevated 542 we will give a dose of Lasix 40 mg daily.  Continue nocturnal CPAP.  Continue statin therapy.  We will follow-up LFTs with mild elevation of bilirubin and TSH.                       [name], MD, [facility] 04/19/2021 5:00 PM

## 2021-04-19 NOTE — ED Notes (Signed)
Pt ambulated in room with pulse oximetry and on room air. Pt oxygen saturation dropped to 92% once and heart rate up to 133

## 2021-04-19 NOTE — ED Notes (Signed)
RN ordered pt dinner tray

## 2021-04-19 NOTE — ED Triage Notes (Addendum)
Pt bib [facility] from PCP office where he went due to shob and fatigue that has gotten increasingly worse over a week. Pt sent here due heart rate being afib 100-180. Pt arrives with heart rate 104. Pt denies chest pain at this time. Pt states he feels like his heart is skipping a beat sometimes but denies palpitations. AOx4 EMS vitals: 120/70, 18R, 98% RA

## 2021-04-19 NOTE — ED Notes (Signed)
Pt transported to xray 

## 2021-04-19 NOTE — Progress Notes (Signed)
ANTICOAGULATION CONSULT NOTE - Initial Consult  Pharmacy Consult for warfarin Indication: atrial fibrillation  Allergies  Allergen Reactions   Dilaudid [Hydromorphone Hcl] Other (See Comments)    Makes pt hyper    Dilaudid [Hydromorphone] Other (See Comments)    hyperactiviity   Tegretol [Carbamazepine] Hives   Tegretol [Carbamazepine] Hives    Patient Measurements: Height: _0  (172.7 cm) Weight: 108.9 kg (240 lb) IBW/kg (Calculated) : 68.4   Vital Signs: Temp: 97.6 F (36.4 C) (02/14 1110) Temp Source: Oral (02/14 1110) BP: 121/84 (02/14 1600) Pulse Rate: 116 (02/14 1600)  Labs: Recent Labs    04/19/21 1124 04/19/21 1154 04/19/21 1337  HGB 12.7*  --   --   HCT 39.7  --   --   PLT 172  --   --   LABPROT  --  24.6*  --   INR  --  2.2*  --   CREATININE 0.98  --   --   TROPONINIHS 5  --  5    Estimated Creatinine Clearance: 75.5 mL/min (by C-G formula based on SCr of 0.98 mg/dL).   Medical History: Past Medical History:  Diagnosis Date   Acquired thrombophilia ([place]) 01/15/2019   Aortic atherosclerosis ([place])    Atrial fibrillation with RVR ([place]) 10/29/2017   CAP (community acquired pneumonia) 03/06/2016   Cardiomyopathy ([place])    a. EF 40-45% in 04/2016, etiology not defined, managed medically.   CHF exacerbation ([place]) 10/29/2017   Chronic laryngitis 10/03/2016   COPD GOLD II  08/02/2016   Quit smoking 2000  PFT's  07/10/2016  FEV1 1.98 (70 % ) ratio 67  p 19 % improvement from saba p nothing  prior to study while of coreg so rec as of 08/02/2016 try off coreg and on bisoprolol      Dysphonia 10/03/2016   Essential hypertension 03/06/2016   Changed from coreg to bisoprolol due to copd with reversible component  08/02/2016 >>>    History of cardioversion    Hoarseness 08/31/2016   Referred to ent 08/31/2016 >>> seen 10/03/16 [name] dx gerd and rhinitis medicamentosa   > improved on f/u 01/31/17 on gerd rx/ flonase and off afrin   Hyperlipidemia    Hypertension     Laryngopharyngeal reflux (LPR) 10/03/2016   Moderate persistent asthma 08/02/2016   Morbid (severe) obesity due to excess calories ([place]) 08/02/2016   Myocardial infarction ([place]) 1980-early 2000s X 2   "mild ones" (03/06/2016)   Nasal polyps 10/03/2016   OSA on CPAP    a. pt states was told by Dr. Maxwell Caul that he would not require CPAP as long as he continued to sleep in recliner which he does for his back.   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 Pharmacy consulted to continue home warfarin therapy for h/o Afib.   Hgb mildly low. INR is therapeutic. Per patient, last dose of warfarin was yesterday night   Home warfarin regimen: 5 mg daily.   Goal of Therapy:  INR 2-3 Monitor platelets by anticoagulation  protocol: Yes   Plan:  -Warfarin 5 mg x 1 dose today  -Monitor daily INR and watch for s/s of bleeding   Albertina Parr, PharmD., BCCCP Clinical Pharmacist Please refer to [facility] for unit-specific pharmacist

## 2021-04-20 ENCOUNTER — Inpatient Hospital Stay (HOSPITAL_COMMUNITY): Payer: Medicare PPO

## 2021-04-20 ENCOUNTER — Encounter (HOSPITAL_COMMUNITY): Payer: Self-pay | Admitting: Cardiovascular Disease

## 2021-04-20 ENCOUNTER — Other Ambulatory Visit (HOSPITAL_COMMUNITY): Payer: Self-pay

## 2021-04-20 DIAGNOSIS — I4819 Other persistent atrial fibrillation: Secondary | ICD-10-CM

## 2021-04-20 DIAGNOSIS — I5023 Acute on chronic systolic (congestive) heart failure: Secondary | ICD-10-CM

## 2021-04-20 DIAGNOSIS — I5021 Acute systolic (congestive) heart failure: Secondary | ICD-10-CM | POA: Diagnosis not present

## 2021-04-20 LAB — BASIC METABOLIC PANEL
Anion gap: 8 (ref 5–15)
BUN: 17 mg/dL (ref 8–23)
CO2: 25 mmol/L (ref 22–32)
Calcium: 8.4 mg/dL — ABNORMAL LOW (ref 8.9–10.3)
Chloride: 105 mmol/L (ref 98–111)
Creatinine, Ser: 0.98 mg/dL (ref 0.61–1.24)
GFR, Estimated: >60 mL/min (ref >60)
Glucose, Bld: 99 mg/dL (ref 70–99)
Potassium: 4.4 mmol/L (ref 3.5–5.1)
Sodium: 138 mmol/L (ref 135–145)

## 2021-04-20 LAB — PROTIME-INR
INR: 3.2 — ABNORMAL HIGH (ref 0.8–1.2)
Prothrombin Time: 32.3 seconds — ABNORMAL HIGH (ref 11.4–15.2)

## 2021-04-20 LAB — CBC
HCT: 37.9 % — ABNORMAL LOW (ref 39.0–52.0)
Hemoglobin: 12.3 g/dL — ABNORMAL LOW (ref 13.0–17.0)
MCH: 30.9 pg (ref 26.0–34.0)
MCHC: 32.5 g/dL (ref 30.0–36.0)
MCV: 95.2 fL (ref 80.0–100.0)
Platelets: 152 10*3/uL (ref 150–400)
RBC: 3.98 MIL/uL — ABNORMAL LOW (ref 4.22–5.81)
RDW: 15 % (ref 11.5–15.5)
WBC: 8 10*3/uL (ref 4.0–10.5)
nRBC: 0 % (ref 0.0–0.2)

## 2021-04-20 LAB — LIPID PANEL
Cholesterol: 122 mg/dL (ref 0–200)
HDL: 37 mg/dL — ABNORMAL LOW (ref >40)
LDL Cholesterol: 73 mg/dL (ref 0–99)
Total CHOL/HDL Ratio: 3.3 RATIO
Triglycerides: 60 mg/dL (ref <150)
VLDL: 12 mg/dL (ref 0–40)

## 2021-04-20 LAB — ECHOCARDIOGRAM COMPLETE
Height: 68 in
P 1/2 time: 447 msec
S' Lateral: 5.3 cm
Single Plane A4C EF: 25.3 %
Weight: 3814.4 oz

## 2021-04-20 MED ORDER — FUROSEMIDE 10 MG/ML IJ SOLN
40.0000 mg | Freq: Every day | INTRAMUSCULAR | Status: DC
  Administered 2021-04-20 – 2021-04-23 (×4): 40 mg via INTRAVENOUS
  Filled 2021-04-20 (×4): qty 4

## 2021-04-20 NOTE — Progress Notes (Signed)
Progress Note  Patient Name: Ruben Murray Date of Encounter: 04/20/2021  Primary Cardiologist: [name], MD   Subjective   Patient seen examined his bedside.  He was lying in bed when I arrived.  He tells me the shortness of breath is improving significantly.  Inpatient Medications    Scheduled Meds:  amiodarone  200 mg Oral BID   digoxin  0.125 mg Oral Daily   furosemide  40 mg Intravenous Daily   metoprolol succinate  100 mg Oral Daily   PHENobarbital  97.2 mg Oral QHS   pravastatin  40 mg Oral QHS   Warfarin - Pharmacist Dosing Inpatient   Does not apply q1600   Continuous Infusions:  PRN Meds: acetaminophen   Vital Signs    Vitals:   04/20/21 0121 04/20/21 0421 04/20/21 0852 04/20/21 0908  BP: 118/70 118/80 107/80 124/85  Pulse: 100 97 (!) 103 85  Resp: _0 Temp: 97.7 F (36.5 C) 98.9 F (37.2 C) 97.8 F (36.6 C)   TempSrc: Oral Oral Oral   SpO2: 98% 98% 93%   Weight: 108.1 kg     Height:        Intake/Output Summary (Last 24 hours) at 04/20/2021 1035 Last data filed at 04/20/2021 0900 Gross per 24 hour  Intake 730 ml  Output 210 ml  Net 520 ml   Filed Weights   04/19/21 1111 04/20/21 0121  Weight: 108.9 kg 108.1 kg    Telemetry    Atrial fibrillation with controlled ventricular rate- Personally Reviewed  ECG    None today- Personally Reviewed  Physical Exam    General: Comfortable, lying in bed Head: Atraumatic, normal size  Eyes: PEERLA, EOMI  Neck: Supple, normal JVD Cardiac: Normal S1, S2; RRR; no murmurs, rubs, or gallops Lungs: Clear to auscultation bilaterally Abd: Soft, nontender, no hepatomegaly  Ext: warm, no edema Musculoskeletal: No deformities, BUE and BLE strength normal and equal Skin: Warm and dry, no rashes   Neuro: Alert and oriented to person, place, time, and situation, CNII-XII grossly intact, no focal deficits  Psych: Normal mood and affect   Labs    Chemistry Recent Labs  Lab 04/19/21 1124  04/20/21 0210  NA 138 138  K 5.1 4.4  CL 105 105  CO2 24 25  GLUCOSE 106* 99  BUN 15 17  CREATININE 0.98 0.98  CALCIUM 8.3* 8.4*  PROT 6.2*  --   ALBUMIN 3.5  --   AST 33  --   ALT 25  --   ALKPHOS 132*  --   BILITOT 1.6*  --   GFRNONAA >60 >60  ANIONGAP 9 8     Hematology Recent Labs  Lab 04/19/21 1124 04/20/21 0210  WBC 9.8 8.0  RBC 4.11* 3.98*  HGB 12.7* 12.3*  HCT 39.7 37.9*  MCV 96.6 95.2  MCH 30.9 30.9  MCHC 32.0 32.5  RDW 15.1 15.0  PLT 172 152    Cardiac EnzymesNo results for input(s): TROPONINI in the last 168 hours. No results for input(s): TROPIPOC in the last 168 hours.   BNP Recent Labs  Lab 04/19/21 1124  BNP 542.1*     DDimer No results for input(s): DDIMER in the last 168 hours.   Radiology    DG Chest 2 View  Result Date: 04/19/2021 CLINICAL DATA:  Atrial fibrillation with rapid ventricular response, mild cough, shortness of breath EXAM: CHEST - 2 VIEW COMPARISON:  12/09/2019 FINDINGS: Enlargement of cardiac silhouette. Mediastinal contours  and pulmonary vascularity normal. Scarring at lung bases. No acute infiltrate, pleural effusion, or pneumothorax. Osseous structures unremarkable. IMPRESSION: Enlargement of cardiac silhouette with bibasilar scarring. No acute abnormalities. Electronically Signed   By: Lavonia Dana M.D.   On: 04/19/2021 12:00    Cardiac Studies   TTE 11/03/2020 IMPRESSIONS     1. Left ventricular ejection fraction, by estimation, is 20 to 25%. The  left ventricle has severely decreased function. The left ventricle  demonstrates global hypokinesis. The left ventricular internal cavity size  was moderately dilated. Left  ventricular diastolic parameters are indeterminate.   2. Right ventricular systolic function is moderately reduced. The right  ventricular size is moderately enlarged. There is mildly elevated  pulmonary artery systolic pressure. The estimated right ventricular  systolic pressure is 42.5 mmHg.   3.  Left atrial size was mildly dilated.   4. Right atrial size was moderately dilated.   5. The mitral valve is normal in structure. Mild mitral valve  regurgitation. No evidence of mitral stenosis.   6. The aortic valve is tricuspid. Aortic valve regurgitation is mild. No  aortic stenosis is present.   7. The inferior vena cava is dilated in size with <50% respiratory  variability, suggesting right atrial pressure of 15 mmHg.   FINDINGS   Left Ventricle: Left ventricular ejection fraction, by estimation, is 20  to 25%. The left ventricle has severely decreased function. The left  ventricle demonstrates global hypokinesis. Definity contrast agent was  given IV to delineate the left  ventricular endocardial borders. The left ventricular internal cavity size  was moderately dilated. There is no left ventricular hypertrophy. Left  ventricular diastolic parameters are indeterminate.   Right Ventricle: The right ventricular size is moderately enlarged. No  increase in right ventricular wall thickness. Right ventricular systolic  function is moderately reduced. There is mildly elevated pulmonary artery  systolic pressure. The tricuspid  regurgitant velocity is 2.40 m/s, and with an assumed right atrial  pressure of 15 mmHg, the estimated right ventricular systolic pressure is  95.6 mmHg.   Left Atrium: Left atrial size was mildly dilated.   Right Atrium: Right atrial size was moderately dilated.   Pericardium: There is no evidence of pericardial effusion.   Mitral Valve: The mitral valve is normal in structure. Mild mitral annular  calcification. Mild mitral valve regurgitation. No evidence of mitral  valve stenosis.   Tricuspid Valve: The tricuspid valve is normal in structure. Tricuspid  valve regurgitation is mild.   Aortic Valve: The aortic valve is tricuspid. Aortic valve regurgitation is  mild. Aortic regurgitation PHT measures 855 msec. No aortic stenosis is  present.    Pulmonic Valve: The pulmonic valve was normal in structure. Pulmonic valve  regurgitation is trivial.   Aorta: The aortic root is normal in size and structure.   Venous: The inferior vena cava is dilated in size with less than 50%  respiratory variability, suggesting right atrial pressure of 15 mmHg.   IAS/Shunts: No atrial level shunt detected by color flow Doppler  Patient Profile     76 y.o. male with systolic heart failure, hypertension, OSA, seizures, aortic atherosclerosis, persistent atrial fibrillation with dilated nonischemic cardiomyopathy likely tachycardia mediated EF 20 to 25%.  Assessment & Plan    Acute exacerbation of chronic heart failure with reduced ejection fraction Nonischemic cardiomyopathy EF 20-25% in August 2021 Persistent atrial fibrillation Hypertension OSA on CPAP  Clinically he tells me that he has started to see the difference in his  breathing.  He was able to lie down without sitting up last night.  Unfortunately his net output is still positive.  I am not sure if this has been documented well.  He tells me he has been going to the bathroom.  We will monitor the trend closely today as well as his clinical progress and reassess the need for adjusting his diuretics.  Strict I's and O's, limit his input to 1 L or less.  Please keep daily weights.  I suspect that his episodic rapid ventricular rate for his atrial fibrillation is leading him to exacerbation of heart failure.  He is persistently in atrial fibrillation and has discussed rhythm control interventions with EP.  For right now we will continue the current dose of amiodarone as well as his beta-blocker.  For questions or updates, please contact [facility] Please consult [email or web address] for contact info under Cardiology/STEMI.      Signed, Berniece Salines, DO  04/20/2021, 10:35 AM

## 2021-04-20 NOTE — Progress Notes (Addendum)
ANTICOAGULATION CONSULT NOTE  Pharmacy Consult for warfarin Indication: atrial fibrillation  Allergies  Allergen Reactions   Dilaudid [Hydromorphone Hcl] Other (See Comments)    Makes pt hyper    Dilaudid [Hydromorphone] Other (See Comments)    hyperactiviity   Tegretol [Carbamazepine] Hives   Tegretol [Carbamazepine] Hives    Patient Measurements: Height: _0  (172.7 cm) Weight: 108.1 kg (238 lb 6.4 oz) IBW/kg (Calculated) : 68.4   Vital Signs: Temp: 97.8 F (36.6 C) (02/15 0852) Temp Source: Oral (02/15 0852) BP: 124/85 (02/15 0908) Pulse Rate: 85 (02/15 0908)  Labs: Recent Labs    04/19/21 1124 04/19/21 1154 04/19/21 1337 04/20/21 0210  HGB 12.7*  --   --  12.3*  HCT 39.7  --   --  37.9*  PLT 172  --   --  152  LABPROT  --  24.6*  --  32.3*  INR  --  2.2*  --  3.2*  CREATININE 0.98  --   --  0.98  TROPONINIHS 5  --  5  --      Estimated Creatinine Clearance: 75.3 mL/min (by C-G formula based on SCr of 0.98 mg/dL).   Medical History: Past Medical History:  Diagnosis Date   Acquired thrombophilia ([place]) 01/15/2019   Aortic atherosclerosis ([place])    Atrial fibrillation with RVR ([place]) 10/29/2017   CAP (community acquired pneumonia) 03/06/2016   Cardiomyopathy ([place])    a. EF 40-45% in 04/2016, etiology not defined, managed medically.   CHF exacerbation ([place]) 10/29/2017   Chronic laryngitis 10/03/2016   COPD GOLD II  08/02/2016   Quit smoking 2000  PFT's  07/10/2016  FEV1 1.98 (70 % ) ratio 67  p 19 % improvement from saba p nothing  prior to study while of coreg so rec as of 08/02/2016 try off coreg and on bisoprolol      Dysphonia 10/03/2016   Essential hypertension 03/06/2016   Changed from coreg to bisoprolol due to copd with reversible component  08/02/2016 >>>    History of cardioversion    Hoarseness 08/31/2016   Referred to ent 08/31/2016 >>> seen 10/03/16 [name] dx gerd and rhinitis medicamentosa   > improved on f/u 01/31/17 on gerd rx/ flonase and off afrin    Hyperlipidemia    Hypertension    Laryngopharyngeal reflux (LPR) 10/03/2016   Moderate persistent asthma 08/02/2016   Morbid (severe) obesity due to excess calories ([place]) 08/02/2016   Myocardial infarction ([place]) 1980-early 2000s X 2   "mild ones" (03/06/2016)   Nasal polyps 10/03/2016   OSA on CPAP    a. pt states was told by Dr. Maxwell Caul that he would not require CPAP as long as he continued to sleep in recliner which he does for his back.   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    04/19/2021 at 12.00 am   amiodarone (PACERONE) 200 MG tablet Take 1 tablet (200 mg total) by mouth daily. 90 tablet  1 04/19/2021 at 12:00 am   fluticasone (FLONASE) 50 MCG/ACT nasal spray Place 2 sprays into both nostrils 2 (two) times daily.      apixaban (ELIQUIS) 5 MG TABS tablet Take 5 mg by mouth 2 (two) times daily.      ARTIFICIAL TEAR SOLUTION OP Place 1 drop into both eyes 2 (two) times daily.      benzonatate (TESSALON) 200 MG capsule Take 200 mg by mouth 3 (three) times daily as needed.      cephALEXin (KEFLEX) 500 MG capsule Take 500 mg by mouth 4 (four) times daily.      ciclopirox (LOPROX) 0.77 % cream Apply 1 application topically 2 (two) times daily.      digoxin (LANOXIN) 0.125 MG tablet Take 1 tablet (0.125 mg total) by mouth daily. 90 tablet 3    doxycycline (VIBRAMYCIN) 100 MG capsule Take 100 mg by mouth 2 (two) times daily.      escitalopram (LEXAPRO) 20 MG tablet Take 20 mg by mouth daily.       famotidine (PEPCID) 20 MG tablet One at bedtime 30 tablet 2    finasteride (PROSCAR) 5 MG tablet Take 5 mg by mouth daily at 12 noon.       furosemide (LASIX) 40 MG tablet Take 1 tablet (40 mg total) by mouth daily. 90 tablet 0    losartan (COZAAR) 50 MG tablet Take 1 tablet (50 mg total) by mouth daily. 90 tablet 3    methylPREDNISolone (MEDROL DOSEPAK) 4 MG TBPK tablet 6 day dose pack - take as directed 21 tablet 0    metoprolol succinate (TOPROL-XL) 100 MG 24 hr tablet Take 1 tablet (100 mg total) by mouth daily. Take with or immediately following a meal. 90 tablet 0    Multiple Vitamin (MULTIVITAMIN WITH MINERALS) TABS tablet Take 1 tablet by mouth daily at 12 noon.      PHENobarbital (LUMINAL) 97.2 MG tablet Take 97.2 mg by mouth at bedtime.       polyvinyl alcohol (LIQUIFILM TEARS) 1.4 % ophthalmic solution Place 1 drop into both eyes daily with lunch.      potassium chloride (KLOR-CON) 10 MEQ tablet Take 10 mEq by mouth daily.      pravastatin (PRAVACHOL) 40 MG tablet Take 40 mg by mouth at bedtime.       SHINGRIX injection       warfarin (COUMADIN) 3 MG tablet Take 3 mg by mouth See admin instructions. Mon,Tues,Wed,Thurs,Fri,Sat only       Assessment: 61 YOM who presents with ongoing dyspnea, orthopnea and palpitations. Pharmacy consulted to continue home warfarin therapy for h/o Afib.  -INR 2.2> 3,2   Home warfarin regimen: 5 mg daily.   Goal of Therapy:  INR 2-3 Monitor platelets by anticoagulation protocol: Yes   Plan:  -Hold warfarin today -Monitor daily INR  Hildred Laser, PharmD Clinical Pharmacist **Pharmacist phone directory can now be found on [email or web address] ([email or web address]).  Listed under [place].

## 2021-04-20 NOTE — Progress Notes (Signed)
Nutrition Brief Note  Patient identified on the Malnutrition Screening Tool (MST) Report. Patient states that he has lost about 70 lbs over the past year, but he has gained it back. He has non-pitting edema, which could be masking some weight loss, however, appetite and intake have been good. From review of usual weights below, patient has had no significant weight changes over the past year.   Wt Readings from Last 15 Encounters:  04/20/21 108.1 kg  03/16/20 106.1 kg  01/27/20 113.9 kg  12/11/19 118 kg  11/06/19 116.3 kg  08/31/19 117.9 kg  05/13/19 123.9 kg  04/11/19 122.5 kg  04/08/19 125.2 kg  03/11/19 115.7 kg  01/15/19 122.9 kg  11/12/18 127.7 kg  11/04/18 123.4 kg  10/16/18 123.4 kg  10/03/18 123.4 kg    Body mass index is 36.25 kg/m. Patient meets criteria for obesity based on current BMI.   Current diet order is heart healthy, patient is consuming approximately 100% of meals at this time. Labs and medications reviewed.   No nutrition interventions warranted at this time. If nutrition issues arise, please consult RD.   Lucas Mallow RD, LDN, CNSC Please refer to Amion for contact information.

## 2021-04-20 NOTE — Progress Notes (Signed)
Pt was transferred from ED to [place] by a staff nurse accompanied. Pt is alert and fully oriented, afebrile, no acute distress at arrival. CCMD called with 2nd person verified Pt's EKG is Atrial fibrillation, rate under controlled, HR 100-105, BP within normal limits. No SOB. Denied chest pain or palpitation. SPO2 95% on room air.  CHG bath given, room oriented, call bell within reached. We will continue to monitor.  Kennyth Lose, RN

## 2021-04-20 NOTE — Progress Notes (Signed)
Echocardiogram 2D Echocardiogram has been performed.  Oneal Deputy Tytionna Cloyd RDCS 04/20/2021, 10:34 AM

## 2021-04-21 ENCOUNTER — Inpatient Hospital Stay (HOSPITAL_COMMUNITY): Payer: Medicare PPO

## 2021-04-21 DIAGNOSIS — M79605 Pain in left leg: Secondary | ICD-10-CM | POA: Diagnosis not present

## 2021-04-21 DIAGNOSIS — I4819 Other persistent atrial fibrillation: Secondary | ICD-10-CM | POA: Diagnosis not present

## 2021-04-21 LAB — BASIC METABOLIC PANEL
Anion gap: 10 (ref 5–15)
BUN: 17 mg/dL (ref 8–23)
CO2: 30 mmol/L (ref 22–32)
Calcium: 8.2 mg/dL — ABNORMAL LOW (ref 8.9–10.3)
Chloride: 97 mmol/L — ABNORMAL LOW (ref 98–111)
Creatinine, Ser: 1.06 mg/dL (ref 0.61–1.24)
GFR, Estimated: >60 mL/min (ref >60)
Glucose, Bld: 100 mg/dL — ABNORMAL HIGH (ref 70–99)
Potassium: 4.1 mmol/L (ref 3.5–5.1)
Sodium: 137 mmol/L (ref 135–145)

## 2021-04-21 LAB — PROTIME-INR
INR: 2.7 — ABNORMAL HIGH (ref 0.8–1.2)
Prothrombin Time: 28.3 seconds — ABNORMAL HIGH (ref 11.4–15.2)

## 2021-04-21 LAB — URIC ACID: Uric Acid, Serum: 8.3 mg/dL (ref 3.7–8.6)

## 2021-04-21 LAB — MAGNESIUM: Magnesium: 1.8 mg/dL (ref 1.7–2.4)

## 2021-04-21 MED ORDER — OXYCODONE-ACETAMINOPHEN 5-325 MG PO TABS
1.0000 | ORAL_TABLET | Freq: Three times a day (TID) | ORAL | Status: DC | PRN
  Administered 2021-04-21 – 2021-04-22 (×3): 1 via ORAL
  Filled 2021-04-21 (×3): qty 1

## 2021-04-21 MED ORDER — WARFARIN SODIUM 4 MG PO TABS
4.0000 mg | ORAL_TABLET | Freq: Once | ORAL | Status: AC
  Administered 2021-04-21: 4 mg via ORAL
  Filled 2021-04-21: qty 1

## 2021-04-21 NOTE — Progress Notes (Signed)
Transition of Care [facility]) Screening Note   Patient Details  Name: Ruben Murray Date of Birth: 11-05-1945   Transition of Care [facility]) CM/SW Contact:    Dawayne Patricia, RN Phone Number: 04/21/2021, 4:15 PM    Transition of Care Department [facility]) has reviewed patient and no TOC needs have been identified at this time. We will continue to monitor patient advancement through interdisciplinary progression rounds. If new patient transition needs arise, please place a TOC consult.

## 2021-04-21 NOTE — Progress Notes (Signed)
Patient with complaints of pain to L ankle and foot through the night. Patient reports pain started early in day (2/15) and progressively worsened through the night. Reports pain with ambulation and mobility. Denies falling or hitting area. States he does have history of arthritis and has had previous issues with his R ankle but never the left. LLE is warm to touch and tender to palpation, no erythema noted. PRN Tylenol given x 1 with minimal relief noted. Patient encouraged to use call bell for assistance with transfers and mobility for safety. On call MD notified around [phone] via message with no return message. Patient assisted with care throughout shift.

## 2021-04-21 NOTE — Progress Notes (Signed)
Progress Note  Patient Name: Ruben Murray Date of Encounter: 04/21/2021  Primary Cardiologist: [name], MD   Subjective   Patient seen examined his bedside.  He complained of significant left foot pain.  From the ankle down.  Inpatient Medications    Scheduled Meds:  amiodarone  200 mg Oral BID   digoxin  0.125 mg Oral Daily   furosemide  40 mg Intravenous Daily   metoprolol succinate  100 mg Oral Daily   PHENobarbital  97.2 mg Oral QHS   pravastatin  40 mg Oral QHS   warfarin  4 mg Oral ONCE-1600   Warfarin - Pharmacist Dosing Inpatient   Does not apply q1600   Continuous Infusions:  PRN Meds: acetaminophen   Vital Signs    Vitals:   04/20/21 2101 04/20/21 2321 04/21/21 0337 04/21/21 0808  BP: 119/86 126/85 115/77 114/84  Pulse: (!) 103 99 (!) 101 97  Resp: _0 Temp: 98.6 F (37 C) 98.3 F (36.8 C) 98.3 F (36.8 C) 98.4 F (36.9 C)  TempSrc: Oral Oral Oral Oral  SpO2: 95% 95% 95% 97%  Weight:      Height:        Intake/Output Summary (Last 24 hours) at 04/21/2021 1129 Last data filed at 04/21/2021 1036 Gross per 24 hour  Intake 960 ml  Output 3650 ml  Net -2690 ml   Filed Weights   04/19/21 1111 04/20/21 0121  Weight: 108.9 kg 108.1 kg    Telemetry    Atrial fibrillation with controlled ventricular rate- Personally Reviewed  ECG    None today- Personally Reviewed  Physical Exam    General: Comfortable, lying in bed Head: Atraumatic, normal size  Eyes: PEERLA, EOMI  Neck: Supple, normal JVD Cardiac: Normal S1, S2; RRR; no murmurs, rubs, or gallops Lungs: Clear to auscultation bilaterally Abd: Soft, nontender, no hepatomegaly  Ext: warm, bilateral +1 edema, left foot tender, 2+ bounding pulses noted, no erythema warm. Musculoskeletal: No deformities, BUE and BLE strength normal and equal Skin: Warm and dry, no rashes   Neuro: Alert and oriented to person, place, time, and situation, CNII-XII grossly intact, no focal  deficits  Psych: Normal mood and affect   Labs    Chemistry Recent Labs  Lab 04/19/21 1124 04/20/21 0210  NA 138 138  K 5.1 4.4  CL 105 105  CO2 24 25  GLUCOSE 106* 99  BUN 15 17  CREATININE 0.98 0.98  CALCIUM 8.3* 8.4*  PROT 6.2*  --   ALBUMIN 3.5  --   AST 33  --   ALT 25  --   ALKPHOS 132*  --   BILITOT 1.6*  --   GFRNONAA >60 >60  ANIONGAP 9 8     Hematology Recent Labs  Lab 04/19/21 1124 04/20/21 0210  WBC 9.8 8.0  RBC 4.11* 3.98*  HGB 12.7* 12.3*  HCT 39.7 37.9*  MCV 96.6 95.2  MCH 30.9 30.9  MCHC 32.0 32.5  RDW 15.1 15.0  PLT 172 152    Cardiac EnzymesNo results for input(s): TROPONINI in the last 168 hours. No results for input(s): TROPIPOC in the last 168 hours.   BNP Recent Labs  Lab 04/19/21 1124  BNP 542.1*     DDimer No results for input(s): DDIMER in the last 168 hours.   Radiology    DG Chest 2 View  Result Date: 04/19/2021 CLINICAL DATA:  Atrial fibrillation with rapid ventricular response, mild cough, shortness of breath  EXAM: CHEST - 2 VIEW COMPARISON:  12/09/2019 FINDINGS: Enlargement of cardiac silhouette. Mediastinal contours and pulmonary vascularity normal. Scarring at lung bases. No acute infiltrate, pleural effusion, or pneumothorax. Osseous structures unremarkable. IMPRESSION: Enlargement of cardiac silhouette with bibasilar scarring. No acute abnormalities. Electronically Signed   By: Lavonia Dana M.D.   On: 04/19/2021 12:00   ECHOCARDIOGRAM COMPLETE  Result Date: 04/20/2021    ECHOCARDIOGRAM REPORT   Patient Name:   Ruben Murray Date of Exam: 04/20/2021 Medical Rec #:  [id number]       Height:       68.0 in Accession #:    [id number]      Weight:       238.4 lb Date of Birth:  07/19/1945       BSA:          2.202 m Patient Age:    76 years        BP:           124/85 mmHg Patient Gender: M               HR:           96 bpm. Exam Location:  Inpatient Procedure: 2D Echo, Color Doppler and Cardiac Doppler Indications:    N16.57  Acute systolic (congestive) heart failure  History:        Patient has prior history of Echocardiogram examinations, most                 recent 11/04/2019. CHF, COPD, Arrythmias:Atrial Fibrillation;                 Risk Factors:Hypertension, Dyslipidemia and Sleep Apnea.  Sonographer:    Raquel Sarna Senior RDCS Referring Phys: [id number] [facility] B ROBERTS  Sonographer Comments: Technically difficult due to COPD and body habitus. IMPRESSIONS  1. Left ventricular ejection fraction, by estimation, is 20 to 25%. The left ventricle has severely decreased function. The left ventricle demonstrates global hypokinesis. The left ventricular internal cavity size was moderately dilated. Left ventricular diastolic parameters are indeterminate.  2. Right ventricular systolic function is mildly reduced. The right ventricular size is mildly enlarged. There is moderately elevated pulmonary artery systolic pressure. The estimated right ventricular systolic pressure is 33.8 mmHg.  3. Left atrial size was moderately dilated.  4. Right atrial size was moderately dilated.  5. The mitral valve is normal in structure. Mild mitral valve regurgitation. No evidence of mitral stenosis.  6. Tricuspid valve regurgitation is mild to moderate.  7. The aortic valve is tricuspid. Aortic valve regurgitation is mild. No aortic stenosis is present.  8. Aortic dilatation noted. There is mild dilatation of the aortic root, measuring 39 mm.  9. The inferior vena cava is dilated in size with <50% respiratory variability, suggesting right atrial pressure of 15 mmHg. FINDINGS  Left Ventricle: Left ventricular ejection fraction, by estimation, is 20 to 25%. The left ventricle has severely decreased function. The left ventricle demonstrates global hypokinesis. The left ventricular internal cavity size was moderately dilated. There is no left ventricular hypertrophy. Left ventricular diastolic parameters are indeterminate. Right Ventricle: The right ventricular size is  mildly enlarged. No increase in right ventricular wall thickness. Right ventricular systolic function is mildly reduced. There is moderately elevated pulmonary artery systolic pressure. The tricuspid regurgitant velocity is 2.85 m/s, and with an assumed right atrial pressure of 15 mmHg, the estimated right ventricular systolic pressure is 32.9 mmHg. Left Atrium: Left atrial size was moderately dilated. Right Atrium:  Right atrial size was moderately dilated. Pericardium: There is no evidence of pericardial effusion. Mitral Valve: The mitral valve is normal in structure. Mild mitral valve regurgitation. No evidence of mitral valve stenosis. Tricuspid Valve: The tricuspid valve is normal in structure. Tricuspid valve regurgitation is mild to moderate. Aortic Valve: The aortic valve is tricuspid. Aortic valve regurgitation is mild. Aortic regurgitation PHT measures 447 msec. No aortic stenosis is present. Pulmonic Valve: The pulmonic valve was normal in structure. Pulmonic valve regurgitation is trivial. Aorta: Aortic dilatation noted. There is mild dilatation of the aortic root, measuring 39 mm. Venous: The inferior vena cava is dilated in size with less than 50% respiratory variability, suggesting right atrial pressure of 15 mmHg. IAS/Shunts: No atrial level shunt detected by color flow Doppler.  LEFT VENTRICLE PLAX 2D LVIDd:         6.40 cm LVIDs:         5.30 cm LV PW:         0.90 cm LV IVS:        0.70 cm LVOT diam:     2.30 cm LV SV:         47 LV SV Index:   21 LVOT Area:     4.15 cm  LV Volumes (MOD) LV vol d, MOD A4C: 190.0 ml LV vol s, MOD A4C: 142.0 ml LV SV MOD A4C:     190.0 ml RIGHT VENTRICLE RV S prime:     8.32 cm/s TAPSE (M-mode): 1.9 cm LEFT ATRIUM           Index        RIGHT ATRIUM           Index LA diam:      5.10 cm 2.32 cm/m   RA Area:     34.00 cm LA Vol (A2C): 77.7 ml 35.28 ml/m  RA Volume:   131.00 ml 59.49 ml/m LA Vol (A4C): 90.2 ml 40.96 ml/m  AORTIC VALVE LVOT Vmax:   65.50 cm/s LVOT  Vmean:  49.400 cm/s LVOT VTI:    0.113 m AI PHT:      447 msec  AORTA Ao Root diam: 3.90 cm Ao Asc diam:  3.20 cm TRICUSPID VALVE TR Peak grad:   32.5 mmHg TR Vmax:        285.00 cm/s  SHUNTS Systemic VTI:  0.11 m Systemic Diam: 2.30 cm Dalton McleanMD Electronically signed by Franki Monte Signature Date/Time: 04/20/2021/4:34:35 PM    Final     Cardiac Studies   TTE 11/03/2020 IMPRESSIONS     1. Left ventricular ejection fraction, by estimation, is 20 to 25%. The  left ventricle has severely decreased function. The left ventricle  demonstrates global hypokinesis. The left ventricular internal cavity size  was moderately dilated. Left  ventricular diastolic parameters are indeterminate.   2. Right ventricular systolic function is moderately reduced. The right  ventricular size is moderately enlarged. There is mildly elevated  pulmonary artery systolic pressure. The estimated right ventricular  systolic pressure is 70.6 mmHg.   3. Left atrial size was mildly dilated.   4. Right atrial size was moderately dilated.   5. The mitral valve is normal in structure. Mild mitral valve  regurgitation. No evidence of mitral stenosis.   6. The aortic valve is tricuspid. Aortic valve regurgitation is mild. No  aortic stenosis is present.   7. The inferior vena cava is dilated in size with <50% respiratory  variability, suggesting right atrial pressure of 15  mmHg.   FINDINGS   Left Ventricle: Left ventricular ejection fraction, by estimation, is 20  to 25%. The left ventricle has severely decreased function. The left  ventricle demonstrates global hypokinesis. Definity contrast agent was  given IV to delineate the left  ventricular endocardial borders. The left ventricular internal cavity size  was moderately dilated. There is no left ventricular hypertrophy. Left  ventricular diastolic parameters are indeterminate.   Right Ventricle: The right ventricular size is moderately enlarged. No   increase in right ventricular wall thickness. Right ventricular systolic  function is moderately reduced. There is mildly elevated pulmonary artery  systolic pressure. The tricuspid  regurgitant velocity is 2.40 m/s, and with an assumed right atrial  pressure of 15 mmHg, the estimated right ventricular systolic pressure is  75.6 mmHg.   Left Atrium: Left atrial size was mildly dilated.   Right Atrium: Right atrial size was moderately dilated.   Pericardium: There is no evidence of pericardial effusion.   Mitral Valve: The mitral valve is normal in structure. Mild mitral annular  calcification. Mild mitral valve regurgitation. No evidence of mitral  valve stenosis.   Tricuspid Valve: The tricuspid valve is normal in structure. Tricuspid  valve regurgitation is mild.   Aortic Valve: The aortic valve is tricuspid. Aortic valve regurgitation is  mild. Aortic regurgitation PHT measures 855 msec. No aortic stenosis is  present.   Pulmonic Valve: The pulmonic valve was normal in structure. Pulmonic valve  regurgitation is trivial.   Aorta: The aortic root is normal in size and structure.   Venous: The inferior vena cava is dilated in size with less than 50%  respiratory variability, suggesting right atrial pressure of 15 mmHg.   IAS/Shunts: No atrial level shunt detected by color flow Doppler  Patient Profile     76 y.o. male with systolic heart failure, hypertension, OSA, seizures, aortic atherosclerosis, persistent atrial fibrillation with dilated nonischemic cardiomyopathy likely tachycardia mediated EF 20 to 25%.  Assessment & Plan    Left foot pain  Acute exacerbation of chronic heart failure with reduced ejection fraction Nonischemic cardiomyopathy EF 20-25% in August 2021 Persistent atrial fibrillation Hypertension OSA on CPAP  Clinically his heart failure has improved significantly.  He is breathing a lot better.  He still had a negative balance 2960 mL I think he  could benefit from another 24 hours of IV diuretics.  He is in atrial fibrillation for rate control continue his amiodarone and beta-blocker.  He has had multiple episodes of sailed DC cardioversion.  I think would be beneficial for him to follow-up with EP in the outpatient setting to discuss plan for rhythm control.  Especially that he is improving with his shortness of breath as noted above on diuretics.  Low blood pressure prohibits optimizing guideline directed medical therapy for his dilated cardiomyopathy.  Unfortunately he has had some foot pain with the right foot.  He tells me it started last night.  I am going get a chest x-ray of the left foot as well as arterial Doppler-he does have pulses but also does have risk factors for peripheral arterial disease and uric acid.  Continue with CPAP.   For questions or updates, please contact [place] Please consult [email or web address] for contact info under Cardiology/STEMI.      Signed, Berniece Salines, DO  04/21/2021, 11:29 AM

## 2021-04-21 NOTE — Progress Notes (Signed)
ANTICOAGULATION CONSULT NOTE - Follow Up Consult  Pharmacy Consult for Warfarin Indication: atrial fibrillation  Allergies  Allergen Reactions   Dilaudid [Hydromorphone Hcl] Other (See Comments)    Makes pt hyper    Hydromorphone Other (See Comments)    hyperactiviity Other reaction(s): made him wild   Tegretol [Carbamazepine] Hives   Carbamazepine Hives and Rash    Other reaction(s): hives    Patient Measurements: Height: _0  (172.7 cm) Weight: 108.1 kg (238 lb 6.4 oz) IBW/kg (Calculated) : 68.4  Vital Signs: Temp: 98.4 F (36.9 C) (02/16 0808) Temp Source: Oral (02/16 0808) BP: 114/84 (02/16 0808) Pulse Rate: 97 (02/16 0808)  Labs: Recent Labs    04/19/21 1124 04/19/21 1154 04/19/21 1337 04/20/21 0210 04/21/21 0126  HGB 12.7*  --   --  12.3*  --   HCT 39.7  --   --  37.9*  --   PLT 172  --   --  152  --   LABPROT  --  24.6*  --  32.3* 28.3*  INR  --  2.2*  --  3.2* 2.7*  CREATININE 0.98  --   --  0.98  --   TROPONINIHS 5  --  5  --   --     Estimated Creatinine Clearance: 75.3 mL/min (by C-G formula based on SCr of 0.98 mg/dL).   Assessment: Anticoag: PTA Warfarin (75m daily) for h/o Afib  -INR 2.2> 3.2>2.7, CBC stable.  Goal of Therapy:  INR 2-3 Monitor platelets by anticoagulation protocol: Yes   Plan:  - Warfarin 476mpo x 1 today -Monitor daily INR    Ruben Aldama S. Ruben HighlandPharmD, BCPS Clinical Staff Pharmacist [email or web address]  Ruben HighlandCrystal Murray 04/21/2021,8:22 AM

## 2021-04-21 NOTE — Consult Note (Signed)
° °  [facility] Inpatient Consult   04/21/2021  Ruben Murray Dec 22, 1945 [id number]  [place] [facility] [ACO] Patient: [name]  Primary Care Provider:  [name], MD, is an Embedded provider at [place] [facility]  Reviewed for [place] status  Patient is however currently active with [facility] in the [place] team  for chronic disease management services.  Patient has been engaged by a [place].  Our community based does show having difficulty maintaining contact with the patient.  Patient may have access to an Embedded Chronic Care Management team at [facility].  Plan: Will follow up with [facility] RNCM regarding post hospital follow up with the Embedded provider's CCM team, if appropriate.   Of note, [facility] Care Management services does not replace or interfere with any services that are needed or arranged by inpatient [facility] care management team.  For additional questions or referrals please contact:  Natividad Brood, RN BSN [facility] Liaison  [phone] business mobile phone Toll free office [phone]  Fax number: [phone] [email or web address]

## 2021-04-21 NOTE — Progress Notes (Signed)
Pharmacist Heart Failure Core Measure Documentation  Assessment: Ruben Murray has an EF documented as 20-25% on 2/15/203 by Echo.  Rationale: Heart failure patients with left ventricular systolic dysfunction (LVSD) and an EF < 40% should be prescribed an angiotensin converting enzyme inhibitor (ACEI) or angiotensin receptor blocker (ARB) at discharge unless a contraindication is documented in the medical record.  This patient is not currently on an ACEI or ARB for HF.  This note is being placed in the record in order to provide documentation that a contraindication to the use of these agents is present for this encounter.  ACE Inhibitor or Angiotensin Receptor Blocker is contraindicated (specify all that apply)  _0   ACEI allergy AND ARB allergy _1   Angioedema _2   Moderate or severe aortic stenosis _3   Hyperkalemia _4   Hypotension _5   Renal artery stenosis _6   Worsening renal function, preexisting renal disease or dysfunction  [name] S. Alford Highland, PharmD, BCPS Clinical Staff Pharmacist [email or web address] [place] 04/21/2021 1:42 PM

## 2021-04-22 ENCOUNTER — Inpatient Hospital Stay (HOSPITAL_COMMUNITY): Payer: Medicare PPO

## 2021-04-22 DIAGNOSIS — I4819 Other persistent atrial fibrillation: Secondary | ICD-10-CM | POA: Diagnosis not present

## 2021-04-22 DIAGNOSIS — M25572 Pain in left ankle and joints of left foot: Secondary | ICD-10-CM

## 2021-04-22 LAB — DIGOXIN LEVEL: Digoxin Level: 0.4 ng/mL — ABNORMAL LOW (ref 0.8–2.0)

## 2021-04-22 LAB — PROTIME-INR
INR: >10 (ref 0.8–1.2)
INR: 2.2 — ABNORMAL HIGH (ref 0.8–1.2)
Prothrombin Time: >90 seconds — ABNORMAL HIGH (ref 11.4–15.2)
Prothrombin Time: 24.2 seconds — ABNORMAL HIGH (ref 11.4–15.2)

## 2021-04-22 LAB — PHENOBARBITAL LEVEL: Phenobarbital: 19.2 ug/mL (ref 15.0–30.0)

## 2021-04-22 MED ORDER — OXYCODONE-ACETAMINOPHEN 5-325 MG PO TABS
1.0000 | ORAL_TABLET | Freq: Four times a day (QID) | ORAL | Status: DC | PRN
  Administered 2021-04-22 – 2021-04-24 (×5): 1 via ORAL
  Filled 2021-04-22 (×5): qty 1

## 2021-04-22 MED ORDER — PREDNISONE 10 MG PO TABS
10.0000 mg | ORAL_TABLET | Freq: Once | ORAL | Status: AC
  Administered 2021-04-22: 10 mg via ORAL
  Filled 2021-04-22: qty 1

## 2021-04-22 MED ORDER — WARFARIN SODIUM 5 MG PO TABS
5.0000 mg | ORAL_TABLET | Freq: Once | ORAL | Status: AC
  Administered 2021-04-22: 5 mg via ORAL
  Filled 2021-04-22: qty 1

## 2021-04-22 MED ORDER — WARFARIN - PHARMACIST DOSING INPATIENT: Freq: Every day | Status: DC

## 2021-04-22 NOTE — Progress Notes (Signed)
ANTICOAGULATION CONSULT NOTE  Pharmacy Consult for warfarin Indication: atrial fibrillation  Allergies  Allergen Reactions   Dilaudid [Hydromorphone Hcl] Other (See Comments)    Makes pt hyper    Hydromorphone Other (See Comments)    hyperactiviity Other reaction(s): made him wild   Tegretol [Carbamazepine] Hives   Carbamazepine Hives and Rash    Other reaction(s): hives    Patient Measurements: Height: _0  (172.7 cm) Weight: 108.1 kg (238 lb 6.4 oz) IBW/kg (Calculated) : 68.4   Vital Signs: Temp: 98.1 F (36.7 C) (02/17 0744) Temp Source: Oral (02/17 0744) BP: 112/81 (02/17 0744) Pulse Rate: 97 (02/17 0744)  Labs: Recent Labs    04/19/21 1124 04/19/21 1154 04/19/21 1337 04/20/21 0210 04/21/21 0126 04/21/21 1320 04/22/21 0225 04/22/21 0449  HGB 12.7*  --   --  12.3*  --   --   --   --   HCT 39.7  --   --  37.9*  --   --   --   --   PLT 172  --   --  152  --   --   --   --   LABPROT  --    < >  --  32.3* 28.3*  --  >90.0* 24.2*  INR  --    < >  --  3.2* 2.7*  --  >10.0* 2.2*  CREATININE 0.98  --   --  0.98  --  1.06  --   --   TROPONINIHS 5  --  5  --   --   --   --   --    < > = values in this interval not displayed.     Estimated Creatinine Clearance: 69.6 mL/min (by C-G formula based on SCr of 1.06 mg/dL).   Medical History: Past Medical History:  Diagnosis Date   Acquired thrombophilia ([place]) 01/15/2019   Aortic atherosclerosis ([place])    Atrial fibrillation with RVR ([place]) 10/29/2017   CAP (community acquired pneumonia) 03/06/2016   Cardiomyopathy ([place])    a. EF 40-45% in 04/2016, etiology not defined, managed medically.   CHF exacerbation ([place]) 10/29/2017   Chronic laryngitis 10/03/2016   COPD GOLD II  08/02/2016   Quit smoking 2000  PFT's  07/10/2016  FEV1 1.98 (70 % ) ratio 67  p 19 % improvement from saba p nothing  prior to study while of coreg so rec as of 08/02/2016 try off coreg and on bisoprolol      Dysphonia 10/03/2016   Essential hypertension  03/06/2016   Changed from coreg to bisoprolol due to copd with reversible component  08/02/2016 >>>    History of cardioversion    Hoarseness 08/31/2016   Referred to ent 08/31/2016 >>> seen 10/03/16 [name] dx gerd and rhinitis medicamentosa   > improved on f/u 01/31/17 on gerd rx/ flonase and off afrin   Hyperlipidemia    Hypertension    Laryngopharyngeal reflux (LPR) 10/03/2016   Moderate persistent asthma 08/02/2016   Morbid (severe) obesity due to excess calories ([place]) 08/02/2016   Myocardial infarction ([place]) 1980-early 2000s X 2   "mild ones" (03/06/2016)   Nasal polyps 10/03/2016   OSA on CPAP    a. pt states was told by Dr. Maxwell Caul that he would not require CPAP as long as he continued to sleep in recliner which he does for his back.   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 Pharmacy consulted to continue home warfarin therapy for h/o Afib.  -INR 2.2   Home warfarin regimen: 5 mg daily.   Goal of Therapy:  INR 2-3 Monitor platelets by anticoagulation protocol: Yes   Plan:  -Warfarin 74m po today -Monitor daily INR  AHildred Laser PharmD Clinical Pharmacist **Pharmacist phone directory can now be found on [email or web address] (PW [email or web address]).  Listed under [place]

## 2021-04-22 NOTE — Care Management Important Message (Signed)
Important Message  Patient Details  Name: Ruben Murray MRN: [id number] Date of Birth: Oct 03, 1945   Medicare Important Message Given:  Yes     [name] 04/22/2021, 9:34 AM

## 2021-04-22 NOTE — Progress Notes (Signed)
HR sustaining 130-140s as pt sitting up at bedside using urinal

## 2021-04-22 NOTE — Progress Notes (Signed)
Progress Note  Patient Name: Ruben Murray Date of Encounter: 04/22/2021  Primary Cardiologist: [name], MD   Subjective   Patient seen examined his bedside.  He complained of significant left foot pain.  Inpatient Medications    Scheduled Meds:  amiodarone  200 mg Oral BID   digoxin  0.125 mg Oral Daily   furosemide  40 mg Intravenous Daily   metoprolol succinate  100 mg Oral Daily   PHENobarbital  97.2 mg Oral QHS   pravastatin  40 mg Oral QHS   warfarin  5 mg Oral ONCE-1600   Warfarin - Pharmacist Dosing Inpatient   Does not apply q1600   Continuous Infusions:  PRN Meds: acetaminophen, oxyCODONE-acetaminophen   Vital Signs    Vitals:   04/21/21 2147 04/21/21 2358 04/22/21 0406 04/22/21 0744  BP: 127/71 104/74 114/76 112/81  Pulse:  81 94 97  Resp: _0 Temp: 99.5 F (37.5 C) 99.5 F (37.5 C) 99.1 F (37.3 C) 98.1 F (36.7 C)  TempSrc: Oral Oral Oral Oral  SpO2: 94% 97% 99% 97%  Weight:      Height:        Intake/Output Summary (Last 24 hours) at 04/22/2021 1124 Last data filed at 04/22/2021 1024 Gross per 24 hour  Intake 240 ml  Output 1900 ml  Net -1660 ml   Filed Weights   04/19/21 1111 04/20/21 0121  Weight: 108.9 kg 108.1 kg    Telemetry    Atrial fibrillation with controlled ventricular rate- Personally Reviewed  ECG    None today- Personally Reviewed  Physical Exam    General: Comfortable, lying in bed Head: Atraumatic, normal size  Eyes: PEERLA, EOMI  Neck: Supple, normal JVD Cardiac: Normal S1, S2; RRR; no murmurs, rubs, or gallops Lungs: Clear to auscultation bilaterally Abd: Soft, nontender, no hepatomegaly  Ext: warm, bilateral +1 edema, left foot tender, 2+ bounding pulses noted, no erythema warm. Musculoskeletal: No deformities, BUE and BLE strength normal and equal Skin: Warm and dry, no rashes   Neuro: Alert and oriented to person, place, time, and situation, CNII-XII grossly intact, no focal deficits   Psych: Normal mood and affect   Labs    Chemistry Recent Labs  Lab 04/19/21 1124 04/20/21 0210 04/21/21 1320  NA 138 138 137  K 5.1 4.4 4.1  CL 105 105 97*  CO2 _1 GLUCOSE 106* 99 100*  BUN _2 CREATININE 0.98 0.98 1.06  CALCIUM 8.3* 8.4* 8.2*  PROT 6.2*  --   --   ALBUMIN 3.5  --   --   AST 33  --   --   ALT 25  --   --   ALKPHOS 132*  --   --   BILITOT 1.6*  --   --   GFRNONAA >60 >60 >60  ANIONGAP _3 Hematology Recent Labs  Lab 04/19/21 1124 04/20/21 0210  WBC 9.8 8.0  RBC 4.11* 3.98*  HGB 12.7* 12.3*  HCT 39.7 37.9*  MCV 96.6 95.2  MCH 30.9 30.9  MCHC 32.0 32.5  RDW 15.1 15.0  PLT 172 152    Cardiac EnzymesNo results for input(s): TROPONINI in the last 168 hours. No results for input(s): TROPIPOC in the last 168 hours.   BNP Recent Labs  Lab 04/19/21 1124  BNP 542.1*     DDimer No results for input(s): DDIMER in the last 168 hours.   Radiology  DG Foot Complete Left  Result Date: 04/21/2021 CLINICAL DATA:  Sudden onset left foot pain, no known injury EXAM: LEFT FOOT - COMPLETE 3+ VIEW COMPARISON:  None. FINDINGS: There is no evidence of fracture or dislocation. There is no evidence of arthropathy or other focal bone abnormality. Mild, diffuse soft tissue edema about the foot and ankle. IMPRESSION: 1. No fracture or dislocation of the left foot. Joint spaces are preserved. 2.  Mild, diffuse soft tissue edema about the foot and ankle. Electronically Signed   By: Delanna Ahmadi M.D.   On: 04/21/2021 12:39    Cardiac Studies   TTE 11/03/2020 IMPRESSIONS   1. Left ventricular ejection fraction, by estimation, is 20 to 25%. The  left ventricle has severely decreased function. The left ventricle  demonstrates global hypokinesis. The left ventricular internal cavity size  was moderately dilated. Left  ventricular diastolic parameters are indeterminate.   2. Right ventricular systolic function is moderately reduced. The right   ventricular size is moderately enlarged. There is mildly elevated  pulmonary artery systolic pressure. The estimated right ventricular  systolic pressure is 17.6 mmHg.   3. Left atrial size was mildly dilated.   4. Right atrial size was moderately dilated.   5. The mitral valve is normal in structure. Mild mitral valve  regurgitation. No evidence of mitral stenosis.   6. The aortic valve is tricuspid. Aortic valve regurgitation is mild. No  aortic stenosis is present.   7. The inferior vena cava is dilated in size with <50% respiratory  variability, suggesting right atrial pressure of 15 mmHg.   FINDINGS   Left Ventricle: Left ventricular ejection fraction, by estimation, is 20  to 25%. The left ventricle has severely decreased function. The left  ventricle demonstrates global hypokinesis. Definity contrast agent was  given IV to delineate the left  ventricular endocardial borders. The left ventricular internal cavity size  was moderately dilated. There is no left ventricular hypertrophy. Left  ventricular diastolic parameters are indeterminate.   Right Ventricle: The right ventricular size is moderately enlarged. No  increase in right ventricular wall thickness. Right ventricular systolic  function is moderately reduced. There is mildly elevated pulmonary artery  systolic pressure. The tricuspid  regurgitant velocity is 2.40 m/s, and with an assumed right atrial  pressure of 15 mmHg, the estimated right ventricular systolic pressure is  16.0 mmHg.   Left Atrium: Left atrial size was mildly dilated.   Right Atrium: Right atrial size was moderately dilated.   Pericardium: There is no evidence of pericardial effusion.   Mitral Valve: The mitral valve is normal in structure. Mild mitral annular  calcification. Mild mitral valve regurgitation. No evidence of mitral  valve stenosis.   Tricuspid Valve: The tricuspid valve is normal in structure. Tricuspid  valve regurgitation is  mild.   Aortic Valve: The aortic valve is tricuspid. Aortic valve regurgitation is  mild. Aortic regurgitation PHT measures 855 msec. No aortic stenosis is  present.   Pulmonic Valve: The pulmonic valve was normal in structure. Pulmonic valve  regurgitation is trivial.   Aorta: The aortic root is normal in size and structure.   Venous: The inferior vena cava is dilated in size with less than 50%  respiratory variability, suggesting right atrial pressure of 15 mmHg.   IAS/Shunts: No atrial level shunt detected by color flow Doppler  Patient Profile     76 y.o. male with systolic heart failure, hypertension, OSA, seizures, aortic atherosclerosis, persistent atrial fibrillation with dilated nonischemic  cardiomyopathy likely tachycardia mediated EF 20 to 25%.  Assessment & Plan    Left foot pain -his x-ray of the foot did not show any fracture.  There is evidence of soft tissue edema.  Also arterial Doppler does not show any concern for any blockages , the DP , anterior tibial pulses and popliteal pulses are appreciated +2.  Despite negative uric acid and in the setting of recent diuretic use gout could be a factor here.  We will do a trial of prednisone this patient to see if this can be any improvement.  He has had some relief with the Percocet.  We will continue to monitor closely.  Nonischemic cardiomyopathy EF 20-25% in August 2021 Acute exacerbation of chronic heart failure with reduced ejection fraction-clinically improving hopefully will be able to transition to p.o. Lasix by tomorrow.   Persistent atrial fibrillation-rate control atrial fibrillation -has failed multiple DC cardioversion.  Suspect that he has now permanent atrial fibrillation.  But he follows with EP outpatient follow-up for further discussion for maintaining or attempt for rhythm control.  Hypertension-history of hypertension  OSA on CPAP  For questions or updates, please contact [place] Please consult  [email or web address] for contact info under Cardiology/STEMI.      Signed, Berniece Salines, DO  04/22/2021, 11:24 AM

## 2021-04-22 NOTE — Progress Notes (Signed)
ABI has been completed.   Results can be found under chart review under CV PROC. 04/22/2021 12:10 PM Gabreille Dardis RVT, RDMS

## 2021-04-22 NOTE — Progress Notes (Signed)
Patient complains of pain in left foot. Foot is warm with some swelling. X-ray is negative for fracture. Pain medication given with effective results. Fuller Canada, RN

## 2021-04-23 DIAGNOSIS — I10 Essential (primary) hypertension: Secondary | ICD-10-CM

## 2021-04-23 DIAGNOSIS — I5021 Acute systolic (congestive) heart failure: Secondary | ICD-10-CM

## 2021-04-23 LAB — PROTIME-INR
INR: 2 — ABNORMAL HIGH (ref 0.8–1.2)
Prothrombin Time: 22.3 seconds — ABNORMAL HIGH (ref 11.4–15.2)

## 2021-04-23 LAB — BASIC METABOLIC PANEL
Anion gap: 10 (ref 5–15)
BUN: 19 mg/dL (ref 8–23)
CO2: 27 mmol/L (ref 22–32)
Calcium: 7.8 mg/dL — ABNORMAL LOW (ref 8.9–10.3)
Chloride: 97 mmol/L — ABNORMAL LOW (ref 98–111)
Creatinine, Ser: 1.07 mg/dL (ref 0.61–1.24)
GFR, Estimated: >60 mL/min (ref >60)
Glucose, Bld: 119 mg/dL — ABNORMAL HIGH (ref 70–99)
Potassium: 4.9 mmol/L (ref 3.5–5.1)
Sodium: 134 mmol/L — ABNORMAL LOW (ref 135–145)

## 2021-04-23 MED ORDER — DAPAGLIFLOZIN PROPANEDIOL 10 MG PO TABS
10.0000 mg | ORAL_TABLET | Freq: Every day | ORAL | Status: DC
  Administered 2021-04-23: 10 mg via ORAL
  Filled 2021-04-23 (×2): qty 1

## 2021-04-23 MED ORDER — FINASTERIDE 5 MG PO TABS
5.0000 mg | ORAL_TABLET | Freq: Every day | ORAL | Status: DC
  Administered 2021-04-23 – 2021-04-24 (×2): 5 mg via ORAL
  Filled 2021-04-23 (×2): qty 1

## 2021-04-23 MED ORDER — ADULT MULTIVITAMIN W/MINERALS CH
1.0000 | ORAL_TABLET | Freq: Every day | ORAL | Status: DC
  Administered 2021-04-23 – 2021-04-24 (×2): 1 via ORAL
  Filled 2021-04-23 (×2): qty 1

## 2021-04-23 MED ORDER — WARFARIN SODIUM 5 MG PO TABS
5.0000 mg | ORAL_TABLET | Freq: Once | ORAL | Status: AC
  Administered 2021-04-23: 5 mg via ORAL
  Filled 2021-04-23: qty 1

## 2021-04-23 MED ORDER — ESCITALOPRAM OXALATE 20 MG PO TABS
20.0000 mg | ORAL_TABLET | Freq: Every day | ORAL | Status: DC
  Administered 2021-04-23 – 2021-04-24 (×2): 20 mg via ORAL
  Filled 2021-04-23 (×2): qty 1

## 2021-04-23 MED ORDER — LOSARTAN POTASSIUM 25 MG PO TABS
25.0000 mg | ORAL_TABLET | Freq: Every day | ORAL | Status: DC
  Administered 2021-04-23 – 2021-04-24 (×2): 25 mg via ORAL
  Filled 2021-04-23 (×2): qty 1

## 2021-04-23 MED ORDER — TORSEMIDE 20 MG PO TABS
20.0000 mg | ORAL_TABLET | Freq: Every day | ORAL | Status: DC
  Administered 2021-04-24: 20 mg via ORAL
  Filled 2021-04-23: qty 1

## 2021-04-23 NOTE — Progress Notes (Signed)
[place] for warfarin Indication: atrial fibrillation  Allergies  Allergen Reactions   Dilaudid [Hydromorphone Hcl] Other (See Comments)    Makes pt hyper    Hydromorphone Other (See Comments)    hyperactiviity Other reaction(s): made him wild   Morphine Sulfate     Other reaction(s): at high dose causes confusion   Tegretol [Carbamazepine] Hives   Carbamazepine Hives and Rash    Other reaction(s): hives    Patient Measurements: Height: _0  (172.7 cm) Weight: 108.1 kg (238 lb 6.4 oz) IBW/kg (Calculated) : 68.4   Vital Signs: Temp: 98.1 F (36.7 C) (02/18 0821) Temp Source: Oral (02/18 0821) BP: 115/74 (02/18 0821) Pulse Rate: 81 (02/18 0821)  Labs: Recent Labs    04/21/21 1320 04/22/21 0225 04/22/21 0449 04/23/21 0144  LABPROT  --  >90.0* 24.2* 22.3*  INR  --  >10.0* 2.2* 2.0*  CREATININE 1.06  --   --  1.07     Estimated Creatinine Clearance: 68.9 mL/min (by C-G formula based on SCr of 1.07 mg/dL).   Medical History: Past Medical History:  Diagnosis Date   Acquired thrombophilia ([place]) 01/15/2019   Aortic atherosclerosis ([place])    Atrial fibrillation with RVR ([place]) 10/29/2017   CAP (community acquired pneumonia) 03/06/2016   Cardiomyopathy ([place])    a. EF 40-45% in 04/2016, etiology not defined, managed medically.   CHF exacerbation ([place]) 10/29/2017   Chronic laryngitis 10/03/2016   COPD GOLD II  08/02/2016   Quit smoking 2000  PFT's  07/10/2016  FEV1 1.98 (70 % ) ratio 67  p 19 % improvement from saba p nothing  prior to study while of coreg so rec as of 08/02/2016 try off coreg and on bisoprolol      Dysphonia 10/03/2016   Essential hypertension 03/06/2016   Changed from coreg to bisoprolol due to copd with reversible component  08/02/2016 >>>    History of cardioversion    Hoarseness 08/31/2016   Referred to ent 08/31/2016 >>> seen 10/03/16 [name] dx gerd and rhinitis medicamentosa   > improved on f/u 01/31/17 on gerd rx/  flonase and off afrin   Hyperlipidemia    Hypertension    Laryngopharyngeal reflux (LPR) 10/03/2016   Moderate persistent asthma 08/02/2016   Morbid (severe) obesity due to excess calories ([place]) 08/02/2016   Myocardial infarction ([place]) 1980-early 2000s X 2   "mild ones" (03/06/2016)   Nasal polyps 10/03/2016   OSA on CPAP    a. pt states was told by Dr. Maxwell Caul that he would not require CPAP as long as he continued to sleep in recliner which he does for his back.   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 Pharmacy consulted to continue PTA warfarin for hx of Afib.   PTA warfarin regimen = 5 mg daily   INR 2.0 therapeutic today but trending down 2.7 > 2.2 > 2.0. Last CBC 2/15 stable.   Goal of Therapy:  INR 2-3 Monitor platelets by anticoagulation protocol: Yes   Plan:  -Warfarin 55m po today -Monitor daily  INR  Laurey Arrow, PharmD PGY1 Pharmacy Resident 04/23/2021  10:45 AM  Please check [email or web address] for unit-specific pharmacy phone numbers.

## 2021-04-23 NOTE — Evaluation (Signed)
Physical Therapy Evaluation Patient Details Name: Ruben Murray MRN: [id number] DOB: [birth year] Today's Date: 04/23/2021  History of Present Illness  76 y/o male presented to ED on 04/19/21 for SOB and fatigue from PCP where he was found to be in Afib. Had complaints of L foot pain but imaging negative and arterial doppler negative. PMH: systolic CHF, hypertension, OSA, seizure, aortic atherosclerosis, COPD, hypertension, persistent atrial fibrillation  Clinical Impression  Patient admitted with above diagnosis. Patient presents with generalized weakness, impaired balance, decreased activity tolerance, L foot pain, and impaired functional mobility. Patient with limited weight bearing on L LE during ambulation but able to ambulate 15' with RW and step to pattern. Patient will have niece who checks in on him at night and potentially staying with him at night as she did for his wife who has dementia. Patient is primary caregiver for wife who recently fell and broke hip and currently at [facility] for repair. Patient will benefit from skilled PT services during acute stay to address listed deficits. Recommend HHPT at discharge to maximize functional independence. If patient feels that he is unable to manage himself independently during the day, SNF would be beneficial to assist in return to independence.        Recommendations for follow up therapy are one component of a multi-disciplinary discharge planning process, led by the attending physician.  Recommendations may be updated based on patient status, additional functional criteria and insurance authorization.  Follow Up Recommendations Home health PT    Assistance Recommended at Discharge Intermittent Supervision/Assistance  Patient can return home with the following  A little help with walking and/or transfers;A little help with bathing/dressing/bathroom;Assistance with cooking/housework;Assist for transportation    Equipment Recommendations Rolling  Nieshia Larmon (2 wheels)  Recommendations for Other Services  OT consult    Functional Status Assessment Patient has had a recent decline in their functional status and demonstrates the ability to make significant improvements in function in a reasonable and predictable amount of time.     Precautions / Restrictions Precautions Precautions: Fall Precaution Comments: L foot pain Restrictions Weight Bearing Restrictions: Yes LLE Weight Bearing: Weight bearing as tolerated      Mobility  Bed Mobility Overal bed mobility: Modified Independent                  Transfers Overall transfer level: Needs assistance Equipment used: Rolling Mahitha Hickling (2 wheels) Transfers: Sit to/from Stand Sit to Stand: Min guard           General transfer comment: min guard for safety. Cues for hand placement    Ambulation/Gait Ambulation/Gait assistance: Supervision Gait Distance (Feet): 15 Feet Assistive device: Rolling Dala Breault (2 wheels) Gait Pattern/deviations: Step-to pattern, Decreased stride length, Decreased stance time - left Gait velocity: decreased     General Gait Details: supervision for safety. Decreased stance time on L foot and short step to pattern  Stairs            Wheelchair Mobility    Modified Rankin (Stroke Patients Only)       Balance Overall balance assessment: Needs assistance Sitting-balance support: Feet supported, No upper extremity supported Sitting balance-Leahy Scale: Good     Standing balance support: Bilateral upper extremity supported, Reliant on assistive device for balance Standing balance-Leahy Scale: Poor Standing balance comment: reliant on Rw                             Pertinent Vitals/Pain Pain  Assessment Pain Assessment: Faces Faces Pain Scale: Hurts little more Pain Location: L foot Pain Descriptors / Indicators: Grimacing Pain Intervention(s): Monitored during session    Home Living Family/patient expects to be  discharged to:: Private residence Living Arrangements: Spouse/significant other (who has dementia) Available Help at Discharge: Family;Available PRN/intermittently Type of Home: House Home Access: Level entry       Home Layout: One level Home Equipment: Kasandra Knudsen - single point Additional Comments: wife fell and broke hip on 2/16 or 2/17 is now admitted at [facility]    Prior Function Prior Level of Function : Independent/Modified Independent;Driving             Mobility Comments: ambulates with SPC. Primary caregiver of wife who has dementia       Hand Dominance        Extremity/Trunk Assessment   Upper Extremity Assessment Upper Extremity Assessment: Overall WFL for tasks assessed    Lower Extremity Assessment Lower Extremity Assessment: LLE deficits/detail LLE Deficits / Details: L foot pain and difficulty bearing full weight through foot    Cervical / Trunk Assessment Cervical / Trunk Assessment: Normal  Communication   Communication: No difficulties  Cognition Arousal/Alertness: Awake/alert Behavior During Therapy: WFL for tasks assessed/performed Overall Cognitive Status: Within Functional Limits for tasks assessed                                          General Comments      Exercises     Assessment/Plan    PT Assessment Patient needs continued PT services  PT Problem List Decreased strength;Decreased range of motion;Decreased activity tolerance;Decreased balance;Decreased mobility;Decreased knowledge of use of DME;Decreased safety awareness;Cardiopulmonary status limiting activity       PT Treatment Interventions DME instruction;Gait training;Neuromuscular re-education;Functional mobility training;Therapeutic exercise;Therapeutic activities;Balance training;Patient/family education    PT Goals (Current goals can be found in the Care Plan section)  Acute Rehab PT Goals Patient Stated Goal: to reduce pain and go home PT Goal Formulation:  With patient Time For Goal Achievement: 05/07/21 Potential to Achieve Goals: Good    Frequency Min 3X/week     Co-evaluation               AM-PAC PT "6 Clicks" Mobility  Outcome Measure Help needed turning from your back to your side while in a flat bed without using bedrails?: None Help needed moving from lying on your back to sitting on the side of a flat bed without using bedrails?: None Help needed moving to and from a bed to a chair (including a wheelchair)?: A Little Help needed standing up from a chair using your arms (e.g., wheelchair or bedside chair)?: A Little Help needed to walk in hospital room?: A Little Help needed climbing 3-5 steps with a railing? : A Lot 6 Click Score: 19    End of Session Equipment Utilized During Treatment: Gait belt Activity Tolerance: Patient tolerated treatment well Patient left: in chair;with call bell/phone within reach Nurse Communication: Mobility status PT Visit Diagnosis: Unsteadiness on feet (R26.81);Muscle weakness (generalized) (M62.81);Difficulty in walking, not elsewhere classified (R26.2)    Time: 1130-1157 PT Time Calculation (min) (ACUTE ONLY): 27 min   Charges:   PT Evaluation $PT Eval Moderate Complexity: 1 Mod PT Treatments $Therapeutic Activity: 8-22 mins        Hibba Schram A. Gilford Rile, PT, DPT Acute Rehabilitation Services Pager [phone] Office [phone]  Braylynn Lewing A Dario Yono 04/23/2021, 12:09 PM

## 2021-04-23 NOTE — Progress Notes (Signed)
Cardiology Progress Note  Patient ID: Ruben Murray MRN: [id number] DOB: [birth year] Date of Encounter: 04/23/2021  Primary Cardiologist: [name], MD  Subjective   Chief Complaint: Leg Pain  HPI: Left leg pain better. No concerns for gout on exam. Denies CP. SOB improving.   ROS:  All other ROS reviewed and negative. Pertinent positives noted in the HPI.     Inpatient Medications  Scheduled Meds:  amiodarone  200 mg Oral BID   digoxin  0.125 mg Oral Daily   metoprolol succinate  100 mg Oral Daily   PHENobarbital  97.2 mg Oral QHS   pravastatin  40 mg Oral QHS   torsemide  20 mg Oral Daily   Warfarin - Pharmacist Dosing Inpatient   Does not apply q1600   Continuous Infusions:  PRN Meds: acetaminophen, oxyCODONE-acetaminophen   Vital Signs   Vitals:   04/23/21 0300 04/23/21 0352 04/23/21 0400 04/23/21 0821  BP:  110/71  115/74  Pulse:  65  81  Resp: _0 Temp:  97.8 F (36.6 C)  98.1 F (36.7 C)  TempSrc:  Oral  Oral  SpO2:  97%  98%  Weight:      Height:        Intake/Output Summary (Last 24 hours) at 04/23/2021 1034 Last data filed at 04/23/2021 1009 Gross per 24 hour  Intake 250 ml  Output 2750 ml  Net -2500 ml   Last 3 Weights 04/20/2021 04/19/2021 03/16/2020  Weight (lbs) 238 lb 6.4 oz 240 lb 234 lb  Weight (kg) 108.138 kg 108.863 kg 106.142 kg      Telemetry  Overnight telemetry shows Afib 80s, which I personally reviewed.   Physical Exam   Vitals:   04/23/21 0300 04/23/21 0352 04/23/21 0400 04/23/21 0821  BP:  110/71  115/74  Pulse:  65  81  Resp: _1 Temp:  97.8 F (36.6 C)  98.1 F (36.7 C)  TempSrc:  Oral  Oral  SpO2:  97%  98%  Weight:      Height:        Intake/Output Summary (Last 24 hours) at 04/23/2021 1034 Last data filed at 04/23/2021 1009 Gross per 24 hour  Intake 250 ml  Output 2750 ml  Net -2500 ml    Last 3 Weights 04/20/2021 04/19/2021 03/16/2020  Weight (lbs) 238 lb 6.4 oz 240 lb 234 lb  Weight (kg)  108.138 kg 108.863 kg 106.142 kg    Body mass index is 36.25 kg/m.  General: Well nourished, well developed, in no acute distress Head: Atraumatic, normal size  Eyes: PEERLA, EOMI  Neck: Supple, JVD 5 to 7 cm of water Endocrine: No thryomegaly Cardiac: Normal S1, S2; irregular rhythm, no murmur Lungs: Diminished breath sounds at the lung bases Abd: Soft, nontender, no hepatomegaly  Ext: No edema, pulses 2+, no tenderness noted in the left lower extremity, no redness or erythema noted Musculoskeletal: No deformities, BUE and BLE strength normal and equal Skin: Warm and dry, no rashes   Neuro: Alert and oriented to person, place, time, and situation, CNII-XII grossly intact, no focal deficits  Psych: Normal mood and affect   Labs  High Sensitivity Troponin:   Recent Labs  Lab 04/19/21 1124 04/19/21 1337  TROPONINIHS 5 5     Cardiac EnzymesNo results for input(s): TROPONINI in the last 168 hours. No results for input(s): TROPIPOC in the last 168 hours.  Chemistry Recent Labs  Lab 04/19/21 1124 04/20/21 0210  04/21/21 1320 04/23/21 0144  NA 138 138 137 134*  K 5.1 4.4 4.1 4.9  CL 105 105 97* 97*  CO2 _0 GLUCOSE 106* 99 100* 119*  BUN _1 CREATININE 0.98 0.98 1.06 1.07  CALCIUM 8.3* 8.4* 8.2* 7.8*  PROT 6.2*  --   --   --   ALBUMIN 3.5  --   --   --   AST 33  --   --   --   ALT 25  --   --   --   ALKPHOS 132*  --   --   --   BILITOT 1.6*  --   --   --   GFRNONAA >60 >60 >60 >60  ANIONGAP _2 Hematology Recent Labs  Lab 04/19/21 1124 04/20/21 0210  WBC 9.8 8.0  RBC 4.11* 3.98*  HGB 12.7* 12.3*  HCT 39.7 37.9*  MCV 96.6 95.2  MCH 30.9 30.9  MCHC 32.0 32.5  RDW 15.1 15.0  PLT 172 152   BNP Recent Labs  Lab 04/19/21 1124  BNP 542.1*    DDimer No results for input(s): DDIMER in the last 168 hours.   Radiology  DG Foot Complete Left  Result Date: 04/21/2021 CLINICAL DATA:  Sudden onset left foot pain, no known injury EXAM:  LEFT FOOT - COMPLETE 3+ VIEW COMPARISON:  None. FINDINGS: There is no evidence of fracture or dislocation. There is no evidence of arthropathy or other focal bone abnormality. Mild, diffuse soft tissue edema about the foot and ankle. IMPRESSION: 1. No fracture or dislocation of the left foot. Joint spaces are preserved. 2.  Mild, diffuse soft tissue edema about the foot and ankle. Electronically Signed   By: Delanna Ahmadi M.D.   On: 04/21/2021 12:39   VAS [place] ABI WITH/WO TBI  Result Date: 04/22/2021  LOWER EXTREMITY DOPPLER STUDY Patient Name:  Ruben Murray  Date of Exam:   04/22/2021 Medical Rec #: [id number]        Accession #:    [id number] Date of Birth: [birth year]        Patient Gender: M Patient Age:   31 years Exam Location:  [facility] Procedure:      VAS [place] ABI WITH/WO TBI Referring Phys: KARDIE TOBB --------------------------------------------------------------------------------  Indications: Left foot and ankle pain. High Risk         Hypertension, hyperlipidemia, past history of smoking, prior Factors:          MI. Other Factors: Afib, CHF.  Comparison Study: No previous exams Performing Technologist: Hill, Jody RVT, RDMS  Examination Guidelines: A complete evaluation includes at minimum, Doppler waveform signals and systolic blood pressure reading at the level of bilateral brachial, anterior tibial, and posterior tibial arteries, when vessel segments are accessible. Bilateral testing is considered an integral part of a complete examination. Photoelectric Plethysmograph (PPG) waveforms and toe systolic pressure readings are included as required and additional duplex testing as needed. Limited examinations for reoccurring indications may be performed as noted.  ABI Findings: +---------+------------------+-----+---------+--------+  Right     Rt Pressure (mmHg) Index Waveform  Comment   +---------+------------------+-----+---------+--------+  Brachial  119                      triphasic            +---------+------------------+-----+---------+--------+  PTA       166  1.32  triphasic           +---------+------------------+-----+---------+--------+  DP        153                1.21  triphasic           +---------+------------------+-----+---------+--------+  Great Toe 120                0.95  Normal              +---------+------------------+-----+---------+--------+ +---------+------------------+-----+---------+-------+  Left      Lt Pressure (mmHg) Index Waveform  Comment  +---------+------------------+-----+---------+-------+  Brachial  126                      triphasic          +---------+------------------+-----+---------+-------+  PTA       166                1.32  triphasic          +---------+------------------+-----+---------+-------+  DP        145                1.15  triphasic          +---------+------------------+-----+---------+-------+  Great Toe 94                 0.75  Normal             +---------+------------------+-----+---------+-------+ +-------+-----------+-----------+------------+------------+  ABI/TBI Today's ABI Today's TBI Previous ABI Previous TBI  +-------+-----------+-----------+------------+------------+  Right   1.32        0.95                                   +-------+-----------+-----------+------------+------------+  Left    1.32        0.75                                   +-------+-----------+-----------+------------+------------+  Summary: Right: Resting right ankle-brachial index indicates noncompressible right lower extremity arteries. The right toe-brachial index is normal. Left: Resting left ankle-brachial index indicates noncompressible left lower extremity arteries. The left toe-brachial index is normal.  *See table(s) above for measurements and observations.  Electronically signed by Orlie Pollen on 04/22/2021 at 6:27:11 PM.    Final     Cardiac Studies  TTE 04/20/2021  1. Left ventricular ejection fraction, by estimation, is 20 to 25%. The   left ventricle has severely decreased function. The left ventricle  demonstrates global hypokinesis. The left ventricular internal cavity size  was moderately dilated. Left  ventricular diastolic parameters are indeterminate.   2. Right ventricular systolic function is mildly reduced. The right  ventricular size is mildly enlarged. There is moderately elevated  pulmonary artery systolic pressure. The estimated right ventricular  systolic pressure is 36.6 mmHg.   3. Left atrial size was moderately dilated.   4. Right atrial size was moderately dilated.   5. The mitral valve is normal in structure. Mild mitral valve  regurgitation. No evidence of mitral stenosis.   6. Tricuspid valve regurgitation is mild to moderate.   7. The aortic valve is tricuspid. Aortic valve regurgitation is mild. No  aortic stenosis is present.   8. Aortic dilatation noted. There is mild dilatation of the aortic root,  measuring 39 mm.  9. The inferior vena cava is dilated in size with <50% respiratory  variability, suggesting right atrial pressure of 15 mmHg.   LHC/RHC 11/06/2019 Findings are consistent with a dilated nonischemic cardiomyopathy.   Normal coronary arteries with a codominant system.   Mild right heart pressure elevation with mild pulmonary hypertension; PA 34/22, mean PA pressure 28 mm.   Atrial flutter with variable block and rapid ventricular response.   RECOMMENDATION:  Guideline directed medical therapy for nonischemic cardiomyopathy.  In this patient with recent Covid 19 positivity consider post Covid myocarditis.  Patient Profile  Ruben Murray is a 76 y.o. male with systolic heart failure (nonischemic), permanent A-fib, hypertension, OSA, obesity who was admitted on 04/19/2021 for acute on chronic systolic heart failure.  Assessment & Plan   #Acute on chronic systolic heart failure, EF 20 to 25% -Appears euvolemic to me or approaching euvolemia.  Net -7.3 L since admission.   Transition to torsemide 20 mg daily. -Nonischemic etiology.  Do not really believe A-fib is the issue here either.  Rates are controlled. -Continue with medical therapy.  On metoprolol succinate 100 mg daily.  Add losartan 25 mg daily.  Likely add Aldactone tomorrow.  Add Farxiga 10 mg daily.  Would recommend he discuss Entresto transition as an outpatient.  Blood pressures may not tolerate this. -Given the EF has not recovered should be considered for ICD as an outpatient.  May want to consider AV node ablation and CRT-D.  We we will defer this to outpatient cardiologist. -Anticipate discharge tomorrow.  #Permanent A-fib -Has failed multiple cardioversions as well as medications in the past. -She would likely consider AV node ablation with CRT D as an outpatient. -For now rates are well controlled.  Continue metoprolol succinate 100 mg daily. -On digoxin 0.125 mg daily. -Amiodarone of no benefit.  We will stop this. -Unfortunately still on Coumadin.  Continue this.  #Left leg pain -Suspect this is just related to diuresis.  Uric acid is normal.  ABIs are normal.  X-ray is negative. -We are stopping aggressive diuresis.  He seems to be doing well this morning.  #HTN -controlled  #OSA -CPAP  FEN -no IVF -dvt ppx: warfarin -diet: heart healthy -code: full -disposition: Anticipate discharge tomorrow.  Physical therapy to evaluate today.  For questions or updates, please contact [place] Please consult [email or web address] for contact info under     Signed, [place] T. Audie Box, MD, [place]  04/23/2021 10:34 AM

## 2021-04-24 ENCOUNTER — Encounter (HOSPITAL_COMMUNITY): Payer: Self-pay | Admitting: Cardiovascular Disease

## 2021-04-24 DIAGNOSIS — M79605 Pain in left leg: Secondary | ICD-10-CM

## 2021-04-24 DIAGNOSIS — G4733 Obstructive sleep apnea (adult) (pediatric): Secondary | ICD-10-CM

## 2021-04-24 DIAGNOSIS — I340 Nonrheumatic mitral (valve) insufficiency: Secondary | ICD-10-CM

## 2021-04-24 DIAGNOSIS — I071 Rheumatic tricuspid insufficiency: Secondary | ICD-10-CM

## 2021-04-24 DIAGNOSIS — I77810 Thoracic aortic ectasia: Secondary | ICD-10-CM

## 2021-04-24 LAB — PROTIME-INR
INR: 2.3 — ABNORMAL HIGH (ref 0.8–1.2)
Prothrombin Time: 25 seconds — ABNORMAL HIGH (ref 11.4–15.2)

## 2021-04-24 MED ORDER — SPIRONOLACTONE 25 MG PO TABS: 12.5000 mg | ORAL_TABLET | Freq: Every day | ORAL | 5 refills | Status: DC

## 2021-04-24 MED ORDER — EMPAGLIFLOZIN 10 MG PO TABS
10.0000 mg | ORAL_TABLET | Freq: Every day | ORAL | Status: DC
  Administered 2021-04-24: 10 mg via ORAL
  Filled 2021-04-24 (×2): qty 1

## 2021-04-24 MED ORDER — DIGOXIN 125 MCG PO TABS: 0.1250 mg | ORAL_TABLET | Freq: Every day | ORAL | 5 refills | Status: DC

## 2021-04-24 MED ORDER — SPIRONOLACTONE 12.5 MG HALF TABLET
12.5000 mg | ORAL_TABLET | Freq: Every day | ORAL | Status: DC
  Administered 2021-04-24: 12.5 mg via ORAL
  Filled 2021-04-24: qty 1

## 2021-04-24 MED ORDER — TORSEMIDE 20 MG PO TABS: 20.0000 mg | ORAL_TABLET | Freq: Every day | ORAL | 5 refills | Status: DC

## 2021-04-24 MED ORDER — LOSARTAN POTASSIUM 25 MG PO TABS: 25.0000 mg | ORAL_TABLET | Freq: Every day | ORAL | 5 refills | Status: DC

## 2021-04-24 MED ORDER — EMPAGLIFLOZIN 10 MG PO TABS
10.0000 mg | ORAL_TABLET | Freq: Every day | ORAL | 5 refills | Status: DC
Start: 2021-04-24 — End: 2021-05-24

## 2021-04-24 NOTE — Evaluation (Signed)
Occupational Therapy Evaluation Patient Details Name: Ruben Murray MRN: [id number] DOB: [birth year] Today's Date: 04/24/2021   History of Present Illness 76 y/o male presented to ED on 04/19/21 for SOB and fatigue from PCP where he was found to be in Afib. Had complaints of L foot pain but imaging negative and arterial doppler negative. PMH: systolic CHF, hypertension, OSA, seizure, aortic atherosclerosis, COPD, hypertension, persistent atrial fibrillation   Clinical Impression   Patient evaluated by Occupational Therapy with no further acute OT needs identified. All education has been completed and the patient has no further questions. Pt is able to complete ADLs with supervision/set up.  He has needed DME for showering, and has been instructed in safety at home and compensatory strategies.  He reports family/friends will assist with meal prep and IADLs.  See below for any follow-up Occupational Therapy or equipment needs. OT is signing off. Thank you for this referral.       Recommendations for follow up therapy are one component of a multi-disciplinary discharge planning process, led by the attending physician.  Recommendations may be updated based on patient status, additional functional criteria and insurance authorization.   Follow Up Recommendations  No OT follow up    Assistance Recommended at Discharge Intermittent Supervision/Assistance  Patient can return home with the following A little help with walking and/or transfers;A little help with bathing/dressing/bathroom;Assistance with cooking/housework;Help with stairs or ramp for entrance;Assist for transportation    Functional Status Assessment  Patient has had a recent decline in their functional status and demonstrates the ability to make significant improvements in function in a reasonable and predictable amount of time.  Equipment Recommendations  None recommended by OT    Recommendations for Other Services        Precautions / Restrictions Precautions Precautions: Fall Precaution Comments: L foot pain Restrictions Weight Bearing Restrictions: Yes LLE Weight Bearing: Weight bearing as tolerated      Mobility Bed Mobility Overal bed mobility: Modified Independent             General bed mobility comments: HOB elevated, pt states he sleeps in a recliner. increased time    Transfers                          Balance Overall balance assessment: Needs assistance Sitting-balance support: Feet supported, No upper extremity supported Sitting balance-Leahy Scale: Good     Standing balance support: No upper extremity supported, During functional activity Standing balance-Leahy Scale: Fair                             ADL either performed or assessed with clinical judgement   ADL Overall ADL's : Needs assistance/impaired Eating/Feeding: Independent   Grooming: Wash/dry hands;Wash/dry face;Oral care;Brushing hair;Supervision/safety;Standing   Upper Body Bathing: Set up;Supervision/ safety;Sitting   Lower Body Bathing: Supervison/ safety;Sit to/from stand   Upper Body Dressing : Sitting;Set up   Lower Body Dressing: Supervision/safety;Sit to/from stand   Toilet Transfer: Supervision/safety;Ambulation;Comfort height toilet;Rolling walker (2 wheels)   Toileting- Clothing Manipulation and Hygiene: Supervision/safety;Sit to/from Nurse, children's Details (indicate cue type and reason): Recommend that pt utilize tub bench for shower, and he agrees Functional mobility during ADLs: Supervision/safety;Rolling walker (2 wheels) General ADL Comments: discussed need to keep cell phone with him at all times and to utilize walker bag to transport needed items.  He verbalized understanding  Vision         Perception     Praxis      Pertinent Vitals/Pain Pain Assessment Pain Assessment: Faces Faces Pain Scale: Hurts little more Pain Location: L  foot Pain Descriptors / Indicators: Grimacing Pain Intervention(s): Monitored during session     Hand Dominance Right   Extremity/Trunk Assessment Upper Extremity Assessment Upper Extremity Assessment: Overall WFL for tasks assessed   Lower Extremity Assessment Lower Extremity Assessment: Defer to PT evaluation   Cervical / Trunk Assessment Cervical / Trunk Assessment: Normal   Communication Communication Communication: No difficulties   Cognition Arousal/Alertness: Awake/alert Behavior During Therapy: WFL for tasks assessed/performed, Anxious Overall Cognitive Status: Within Functional Limits for tasks assessed                                 General Comments: Pt anxious about wife's condition and inability to reach her via phone.  Once he spoke with her anxiety improved     General Comments  HR 96-159    Exercises     Shoulder Instructions      Home Living Family/patient expects to be discharged to:: Private residence Living Arrangements: Spouse/significant other Available Help at Discharge: Family;Available PRN/intermittently Type of Home: House Home Access: Level entry     Home Layout: One level     Bathroom Shower/Tub: Teacher, early years/pre: Handicapped height     Home Equipment: Cane - single point;Tub bench   Additional Comments: Wife has dementia and pt is caregiver.  She fell and broke hip on 2/16 or 2/17 is now admitted at [facility]      Prior Functioning/Environment Prior Level of Function : Independent/Modified Independent;Driving             Mobility Comments: ambulates with SPC. Primary caregiver of wife who has dementia ADLs Comments: caregiver for spouse        OT Problem List: Decreased activity tolerance;Impaired balance (sitting and/or standing);Cardiopulmonary status limiting activity;Pain      OT Treatment/Interventions:      OT Goals(Current goals can be found in the care plan section) Acute Rehab OT  Goals Patient Stated Goal: to see wife OT Goal Formulation: All assessment and education complete, DC therapy  OT Frequency:      Co-evaluation              AM-PAC OT "6 Clicks" Daily Activity     Outcome Measure Help from another person eating meals?: None Help from another person taking care of personal grooming?: A Little Help from another person toileting, which includes using toliet, bedpan, or urinal?: A Little Help from another person bathing (including washing, rinsing, drying)?: A Little Help from another person to put on and taking off regular upper body clothing?: A Little Help from another person to put on and taking off regular lower body clothing?: A Little 6 Click Score: 19   End of Session Equipment Utilized During Treatment: Rolling walker (2 wheels) Nurse Communication: Mobility status  Activity Tolerance: Patient tolerated treatment well Patient left: in chair;with call bell/phone within reach  OT Visit Diagnosis: Unsteadiness on feet (R26.81);Pain Pain - Right/Left: Right Pain - part of body: Ankle and joints of foot                Time: 9935-7017 OT Time Calculation (min): 38 min Charges:  OT General Charges $OT Visit: 1 Visit OT Evaluation $OT Eval Moderate Complexity: 1  Mod OT Treatments $Self Care/Home Management : 23-37 mins  Nilsa Nutting., OTR/L Acute Rehabilitation Services Pager [phone] Office [phone]   Lucille Passy M 04/24/2021, 11:15 AM

## 2021-04-24 NOTE — Progress Notes (Signed)
Physical Therapy Treatment Patient Details Name: Ruben Murray MRN: [id number] DOB: 1945/10/02 Today's Date: 04/24/2021   History of Present Illness 76 y/o male presented to ED on 04/19/21 for SOB and fatigue from PCP where he was found to be in Afib. Had complaints of L foot pain but imaging negative and arterial doppler negative. PMH: systolic CHF, hypertension, OSA, seizure, aortic atherosclerosis, COPD, hypertension, persistent atrial fibrillation    PT Comments    The pt was agreeable to session and states he is hopeful that he is d/c home today. He was able to make good progress with hallway ambulation, but remains limited by L foot/ankle pain at this time resulting in increased dependence on RW and slowed gait. We discussed mobility and management at home at length as the pt is dependent on BUE support on RW for mobility and planning to d/c home alone. He has arranged family and neighbors to assist with IADLs like cooking and cleaning/laundry to reduce exertion and burden on the pt. The pt will continue to benefit from skilled PT and HHPT to progress endurance and LE strength to progress from RW back to use of SPC. The pt is safe to return home once medically cleared.   Gait Speed: 0.34ms using RW. (Gait speed <0.639m indicates increased risk of falls and dependence in ADLs)  Recommendations for follow up therapy are one component of a multi-disciplinary discharge planning process, led by the attending physician.  Recommendations may be updated based on patient status, additional functional criteria and insurance authorization.  Follow Up Recommendations  Home health PT     Assistance Recommended at Discharge Intermittent Supervision/Assistance  Patient can return home with the following A little help with walking and/or transfers;A little help with bathing/dressing/bathroom;Assistance with cooking/housework;Assist for transportation   Equipment Recommendations  Rolling walker (2  wheels)    Recommendations for Other Services       Precautions / Restrictions Precautions Precautions: Fall Precaution Comments: L foot pain Restrictions Weight Bearing Restrictions: Yes LLE Weight Bearing: Weight bearing as tolerated     Mobility  Bed Mobility Overal bed mobility: Modified Independent             General bed mobility comments: HOB elevated, pt states he sleeps in a recliner. increased time    Transfers Overall transfer level: Needs assistance Equipment used: Rolling walker (2 wheels) Transfers: Sit to/from Stand Sit to Stand: Min guard           General transfer comment: min guard for safety. Cues for hand placement, increased time to power up    Ambulation/Gait Ambulation/Gait assistance: Supervision Gait Distance (Feet): 45 Feet Assistive device: Rolling walker (2 wheels) Gait Pattern/deviations: Step-to pattern, Decreased stride length, Decreased stance time - left Gait velocity: 0.07 m/s Gait velocity interpretation: <1.31 ft/sec, indicative of household ambulator   General Gait Details: supervision for safety. Decreased stance time on L foot and short step to pattern       Balance Overall balance assessment: Needs assistance Sitting-balance support: Feet supported, No upper extremity supported Sitting balance-Leahy Scale: Good     Standing balance support: Bilateral upper extremity supported, Reliant on assistive device for balance Standing balance-Leahy Scale: Poor Standing balance comment: reliant on RW                            Cognition Arousal/Alertness: Awake/alert Behavior During Therapy: WFL for tasks assessed/performed Overall Cognitive Status: Within Functional Limits for tasks assessed  General Comments: pt able to follow all cues and instructions, reports his daughter is going to take over financial management        Exercises General Exercises - Lower  Extremity Ankle Circles/Pumps: AROM, Both, 20 reps, Seated    General Comments General comments (skin integrity, edema, etc.): HR to high of 165bpm with exertion, returned to 90s quickly with seated rest      Pertinent Vitals/Pain Pain Assessment Pain Assessment: No/denies pain Faces Pain Scale: Hurts little more Pain Location: L foot Pain Descriptors / Indicators: Grimacing Pain Intervention(s): Monitored during session, Repositioned     PT Goals (current goals can now be found in the care plan section) Acute Rehab PT Goals Patient Stated Goal: to reduce pain and go home PT Goal Formulation: With patient Time For Goal Achievement: 05/07/21 Potential to Achieve Goals: Good Progress towards PT goals: Progressing toward goals    Frequency    Min 3X/week      PT Plan Current plan remains appropriate       AM-PAC PT "6 Clicks" Mobility   Outcome Measure  Help needed turning from your back to your side while in a flat bed without using bedrails?: None Help needed moving from lying on your back to sitting on the side of a flat bed without using bedrails?: None Help needed moving to and from a bed to a chair (including a wheelchair)?: A Little Help needed standing up from a chair using your arms (e.g., wheelchair or bedside chair)?: A Little Help needed to walk in hospital room?: A Little Help needed climbing 3-5 steps with a railing? : A Lot 6 Click Score: 19    End of Session Equipment Utilized During Treatment: Gait belt Activity Tolerance: Patient tolerated treatment well Patient left: in chair;with call bell/phone within reach Nurse Communication: Mobility status PT Visit Diagnosis: Unsteadiness on feet (R26.81);Muscle weakness (generalized) (M62.81);Difficulty in walking, not elsewhere classified (R26.2)     Time: 4315-4008 PT Time Calculation (min) (ACUTE ONLY): 27 min  Charges:  $Gait Training: 8-22 mins $Therapeutic Exercise: 8-22 mins                      [place] Carbo, PT, DPT   Acute Rehabilitation Department Pager #: [phone]   Sandra Cockayne 04/24/2021, 9:34 AM

## 2021-04-24 NOTE — TOC Transition Note (Signed)
Transition of Care [facility]) - CM/SW Discharge Note   Patient Details  Name: Ruben Murray MRN: [id number] Date of Birth: 05/13/1945  Transition [facility]) CM/SW Contact:  Carles Collet, RN Phone Number: 04/24/2021, 10:56 AM   Clinical Narrative:   Damaris Schooner w patient at bedside. He would like [facility], states he is familiar with them as his wife is currently using them.  Prefers rollator. DME to be delivered to room, [facility] set up w [facility]. Daughter to pick up after lunch     Final next level of care: [place] Barriers to Discharge: No Barriers Identified   Patient Goals and CMS Choice Patient states their goals for this hospitalization and ongoing recovery are:: return home CMS Medicare.gov Compare Post Acute Care list provided to:: Patient Choice offered to / list presented to : Patient  Discharge Placement                       Discharge Plan and Services                DME Arranged: Walker rolling with seat DME Agency: [employer] Date DME Agency Contacted: 04/24/21 Time DME Agency Contacted: 1054 Representative spoke with at DME Agency: [place]: PT [place]: [place] Date [place]: 04/24/21 Time [place]: 1054 Representative spoke with at [place]: [place] ([place]) Interventions     Readmission Risk Interventions Readmission Risk Prevention Plan 12/11/2019  Transportation Screening Complete  PCP or Specialist Appt within 5-7 Days Complete  Home Care Screening Complete  Medication Review (RN CM) Complete  Some recent data might be hidden

## 2021-04-24 NOTE — Progress Notes (Signed)
Order received to discharge patient.  Telemetry monitor removed and CCMD notified.  PIV access removed.  Equipment delivered to patient's room discharge instructions, follow up, medications and instructions for their use discussed with patient.  Patient escorted to his wife's hospital room on 5N to wait for his ride home.

## 2021-04-24 NOTE — Discharge Summary (Addendum)
Discharge Summary    Patient ID: Ruben Murray MRN: [id number]; DOB: [birth year]  Admit date: 04/19/2021 Discharge date: 04/24/2021  PCP:  [name], MD   [facility] HeartCare Providers Cardiologist:  [name], MD  Electrophysiologist:  [name], MD       Discharge Diagnoses    Principal Problem:   Acute on chronic systolic CHF (congestive heart failure) [facility]) Active Problems:   Essential hypertension   Permanent atrial fibrillation (HCC)   Left leg pain   OSA (obstructive sleep apnea)   Mitral regurgitation   Tricuspid regurgitation   Dilated aortic root ([place])    Diagnostic Studies/Procedures    2D echo 04/20/21   1. Left ventricular ejection fraction, by estimation, is 20 to 25%. The  left ventricle has severely decreased function. The left ventricle  demonstrates global hypokinesis. The left ventricular internal cavity size  was moderately dilated. Left  ventricular diastolic parameters are indeterminate.   2. Right ventricular systolic function is mildly reduced. The right  ventricular size is mildly enlarged. There is moderately elevated  pulmonary artery systolic pressure. The estimated right ventricular  systolic pressure is 40.9 mmHg.   3. Left atrial size was moderately dilated.   4. Right atrial size was moderately dilated.   5. The mitral valve is normal in structure. Mild mitral valve  regurgitation. No evidence of mitral stenosis.   6. Tricuspid valve regurgitation is mild to moderate.   7. The aortic valve is tricuspid. Aortic valve regurgitation is mild. No  aortic stenosis is present.   8. Aortic dilatation noted. There is mild dilatation of the aortic root,  measuring 39 mm.   9. The inferior vena cava is dilated in size with <50% respiratory  variability, suggesting right atrial pressure of 15 mmHg.  _____________   History of Present Illness     Ruben Murray is a 76 y.o. male with chronic systolic CHF, hypertension, OSA,  obesity, seizure, aortic atherosclerosis, COPD, hypertension, HLD, persistent atrial fibrillation and prior atrial flutter who presented to the ER for evaluation of dyspnea and atrial fibrillation.  The patient was diagnosed with atrial fibrillation in 2018, initially diagnosed shortly after being diagnosed with PNA. EF was 45% in 04/2016, felt possibly tachy-mediated. He initially converted to NSR with diltiazem. His medications were adjusted given his LV dysfunction. He has been anticoagulated with warfarin because of drug interaction between Eliquis/Xarelto and phenobarbital. Over the last several years he has had intermittent recurrence of arrhythmia as well as varying degrees of LV dysfunction. He was started on amiodarone in 2020 and has required several cardioversions over the years. He was admitted 10/2019 for LLE cellulitis and Covid at which time cardiology consulted for atrial flutter and echo showing EF 20-25%. Cath 11/2019 showed normal coronaries, consistent with NICM. He has required diuresis for HF in the past. At last outpatient OV 03/2020 he was back in atrial fibrillation with plan to pursue another cardioversion but do not see this occurred. It was noted that he had been seen by Dr. Curt Bears and offered the option of ablation if failed repeat cardioversion. This was his last OV with [place] as outpatient, as he did not return for 6 month follow-up.  In the interim he has been under a lot of stress. He has been the primary caregiver for his wife who has dementia and broke her hip this past year and has been in rehab and recently discharged back home. He presented back to the hospital 04/19/2021 with worsening  dyspnea on exertion for several weeks. He went to his PCP and was found to be in rapid atrial fib so was sent to the ER for further evaluation. Labs in the ED showed sodium 138, potassium 5.1, creatinine 0.9, alk phos 132, total protein 6.2, total bilirubin 1.6, BNP 542, high-sensitivity troponin  5>>5, WBC 9.8, hemoglobin 12.7, INR 2.2, TSH 1.8.  EKG showed atrial fibrillation with RVR, 129 bpm.  Chest x-ray without overt edema. HR range was 100-160. He was admitted for further management.   Of note, upon review of MAR at discharge, the patient appeared to be no longer taking amiodarone or losartan. Pharmacy indicated the patient had not filled digoxin since 08/2020 or Lasix since 10/2020.    Hospital Course     1. Acute on chronic systolic CHF / NICM  - 2D echocardiogram 04/20/21 showed continued LV dysfunction EF 20-25%, global HK, moderate LV dilation, mildly reduced RV function, moderately elevated PASP, moderate BAE, mild MR, mild-moderate TR, mild AI, mild dilation of aortic root, dilated IVC - EF consistent with prior study in 2021 - started on IV Lasix with net -8.4L although not clear intake is fully accurate - admit stated weight of 240lb followed by measured weight 238.4 -> 227.74 at discharge - transitioned to torsemide 38m daily at discharge - to continue metoprolol - losartan re-added - Farxiga added but switched to Jardiance at discharge as there was notification that FWilder Gladeis not covered by his insurance - spironolactone added - low dose - would recommend he discuss Entresto transition as an outpatient.  Blood pressures may not tolerate this. It also appears he previously stopped this due to cost - Given the EF has not recovered should be considered for ICD as an outpatient.  May want to consider AV node ablation and CRT-D.  We we will defer this to outpatient cardiologist. - [facility] try to get him in this week for close follow-up (will need close f/u BMET given med changes), as well as send message to EP scheduler to get him back in to see Dr. CCurt Bearsfor review  2. Permanent atrial fibrillation - Has failed multiple cardioversions as well as medications in the past - Will need to likely consider AV node ablation with CRT D as an  outpatient - For now rates are well controlled with plan to continue metoprolol succinate 100 mg daily and digoxin 0.1257mdaily - Amiodarone felt no longer of benefit, so this was discontinued - Continue Coumadin anticoagulation - per pharmD, recommend to continue PTA dose of 73m30maily with INR recheck in 1 week. Patient states PCP Dr. HagMannie Stabilellows his INR check so he was instructed to call her office tomorrow to arrange that follow-up   3. Left leg pain - plain film with mild soft tissue edema about the foot and ankle but no orthopedic deformity - vascular ABIs normal 04/22/21 - uric acid normal - suspected related to diuresis, improved - HHPT and rolling walker ordered per PT recommendation  4. Essential HTN - managed in context above with tendency for low blood pressure  5. OSA - recommended to continue CPAP  6. Mild MR, mild-moderate TR, mild dilation of aortic root - continue to follow as OP  Dr. O'NAudie Boxs seen and examined the patient today and feels he is stable for discharge. I [facility], [facility] and EP scheduler requesting follow-up as well as TOC call.  Did the patient have an acute coronary syndrome (MI, NSTEMI, STEMI, etc) this admission?:  No                               Did the patient have a percutaneous coronary intervention (stent / angioplasty)?:  No.    _____________  Discharge Vitals Blood pressure 100/63, pulse 89, temperature 98 F (36.7 C), temperature source Oral, resp. rate 16, height _0  (1.727 m), weight 103.3 kg, SpO2 92 %.  Filed Weights   04/19/21 1111 04/20/21 0121 04/24/21 0417  Weight: 108.9 kg 108.1 kg 103.3 kg   Exam per MD below  Labs & Radiologic Studies    CBC No results for input(s): WBC, NEUTROABS, HGB, HCT, MCV, PLT in the last 72 hours. Basic Metabolic Panel Recent Labs    04/21/21 1320 04/23/21 0144  NA 137 134*  K 4.1 4.9  CL 97* 97*  CO2 30 27  GLUCOSE 100* 119*  BUN  17 19  CREATININE 1.06 1.07  CALCIUM 8.2* 7.8*  MG 1.8  --    High Sensitivity Troponin:   Recent Labs  Lab 04/19/21 1124 04/19/21 1337  TROPONINIHS 5 5    _____________  DG Chest 2 View  Result Date: 04/19/2021 CLINICAL DATA:  Atrial fibrillation with rapid ventricular response, mild cough, shortness of breath EXAM: CHEST - 2 VIEW COMPARISON:  12/09/2019 FINDINGS: Enlargement of cardiac silhouette. Mediastinal contours and pulmonary vascularity normal. Scarring at lung bases. No acute infiltrate, pleural effusion, or pneumothorax. Osseous structures unremarkable. IMPRESSION: Enlargement of cardiac silhouette with bibasilar scarring. No acute abnormalities. Electronically Signed   By: Lavonia Dana M.D.   On: 04/19/2021 12:00   DG Foot Complete Left  Result Date: 04/21/2021 CLINICAL DATA:  Sudden onset left foot pain, no known injury EXAM: LEFT FOOT - COMPLETE 3+ VIEW COMPARISON:  None. FINDINGS: There is no evidence of fracture or dislocation. There is no evidence of arthropathy or other focal bone abnormality. Mild, diffuse soft tissue edema about the foot and ankle. IMPRESSION: 1. No fracture or dislocation of the left foot. Joint spaces are preserved. 2.  Mild, diffuse soft tissue edema about the foot and ankle. Electronically Signed   By: Delanna Ahmadi M.D.   On: 04/21/2021 12:39   VAS [place] ABI WITH/WO TBI  Result Date: 04/22/2021  LOWER EXTREMITY DOPPLER STUDY Patient Name:  STOKELY JEANCHARLES  Date of Exam:   04/22/2021 Medical Rec #: [id number]        Accession #:    [id number] Date of Birth: [birth year]        Patient Gender: M Patient Age:   77 years Exam Location:  [facility] Procedure:      VAS [place] ABI WITH/WO TBI Referring Phys: KARDIE TOBB --------------------------------------------------------------------------------  Indications: Left foot and ankle pain. High Risk         Hypertension, hyperlipidemia, past history of smoking, prior Factors:          MI. Other Factors: Afib, CHF.   Comparison Study: No previous exams Performing Technologist: Hill, Jody RVT, RDMS  Examination Guidelines: A complete evaluation includes at minimum, Doppler waveform signals and systolic blood pressure reading at the level of bilateral brachial, anterior tibial, and posterior tibial arteries, when vessel segments are accessible. Bilateral testing is considered an integral part of a complete examination. Photoelectric Plethysmograph (PPG) waveforms and toe systolic pressure readings are included as required and additional duplex testing  as needed. Limited examinations for reoccurring indications may be performed as noted.  ABI Findings: +---------+------------------+-----+---------+--------+  Right     Rt Pressure (mmHg) Index Waveform  Comment   +---------+------------------+-----+---------+--------+  Brachial  119                      triphasic           +---------+------------------+-----+---------+--------+  PTA       166                1.32  triphasic           +---------+------------------+-----+---------+--------+  DP        153                1.21  triphasic           +---------+------------------+-----+---------+--------+  Great Toe 120                0.95  Normal              +---------+------------------+-----+---------+--------+ +---------+------------------+-----+---------+-------+  Left      Lt Pressure (mmHg) Index Waveform  Comment  +---------+------------------+-----+---------+-------+  Brachial  126                      triphasic          +---------+------------------+-----+---------+-------+  PTA       166                1.32  triphasic          +---------+------------------+-----+---------+-------+  DP        145                1.15  triphasic          +---------+------------------+-----+---------+-------+  Great Toe 94                 0.75  Normal             +---------+------------------+-----+---------+-------+ +-------+-----------+-----------+------------+------------+  ABI/TBI Today's  ABI Today's TBI Previous ABI Previous TBI  +-------+-----------+-----------+------------+------------+  Right   1.32        0.95                                   +-------+-----------+-----------+------------+------------+  Left    1.32        0.75                                   +-------+-----------+-----------+------------+------------+  Summary: Right: Resting right ankle-brachial index indicates noncompressible right lower extremity arteries. The right toe-brachial index is normal. Left: Resting left ankle-brachial index indicates noncompressible left lower extremity arteries. The left toe-brachial index is normal.  *See table(s) above for measurements and observations.  Electronically signed by Orlie Pollen on 04/22/2021 at 6:27:11 PM.    Final    ECHOCARDIOGRAM COMPLETE  Result Date: 04/20/2021    ECHOCARDIOGRAM REPORT   Patient Name:   FURQAN GOSSELIN Date of Exam: 04/20/2021 Medical Rec #:  [id number]       Height:       68.0 in Accession #:    [id number]      Weight:       238.4 lb Date of Birth:  Jul 06, 1945       BSA:  2.202 m Patient Age:    76 years        BP:           124/85 mmHg Patient Gender: M               HR:           96 bpm. Exam Location:  Inpatient Procedure: 2D Echo, Color Doppler and Cardiac Doppler Indications:    F74.94 Acute systolic (congestive) heart failure  History:        Patient has prior history of Echocardiogram examinations, most                 recent 11/04/2019. CHF, COPD, Arrythmias:Atrial Fibrillation;                 Risk Factors:Hypertension, Dyslipidemia and Sleep Apnea.  Sonographer:    Raquel Sarna Senior RDCS Referring Phys: [phone] [facility]ERT[facility]  Sonographer Comments: Technically difficult due to COPD and body habitus. IMPRESSIONS  1. Left ventricular ejection fraction, by estimation, is 20 to 25%. The left ventricle has severely decreased function. The left ventricle demonstrates global hypokinesis. The left ventricular internal cavity size was moderately  dilated. Left ventricular diastolic parameters are indeterminate.  2. Right ventricular systolic function is mildly reduced. The right ventricular size is mildly enlarged. There is moderately elevated pulmonary artery systolic pressure. The estimated right ventricular systolic pressure is 91.6 mmHg.  3. Left atrial size was moderately dilated.  4. Right atrial size was moderately dilated.  5. The mitral valve is normal in structure. Mild mitral valve regurgitation. No evidence of mitral stenosis.  6. Tricuspid valve regurgitation is mild to moderate.  7. The aortic valve is tricuspid. Aortic valve regurgitation is mild. No aortic stenosis is present.  8. Aortic dilatation noted. There is mild dilatation of the aortic root, measuring 39 mm.  9. The inferior vena cava is dilated in size with <50% respiratory variability, suggesting right atrial pressure of 15 mmHg. FINDINGS  Left Ventricle: Left ventricular ejection fraction, by estimation, is 20 to 25%. The left ventricle has severely decreased function. The left ventricle demonstrates global hypokinesis. The left ventricular internal cavity size was moderately dilated. There is no left ventricular hypertrophy. Left ventricular diastolic parameters are indeterminate. Right Ventricle: The right ventricular size is mildly enlarged. No increase in right ventricular wall thickness. Right ventricular systolic function is mildly reduced. There is moderately elevated pulmonary artery systolic pressure. The tricuspid regurgitant velocity is 2.85 m/s, and with an assumed right atrial pressure of 15 mmHg, the estimated right ventricular systolic pressure is 38.4 mmHg. Left Atrium: Left atrial size was moderately dilated. Right Atrium: Right atrial size was moderately dilated. Pericardium: There is no evidence of pericardial effusion. Mitral Valve: The mitral valve is normal in structure. Mild mitral valve regurgitation. No evidence of mitral valve stenosis. Tricuspid Valve:  The tricuspid valve is normal in structure. Tricuspid valve regurgitation is mild to moderate. Aortic Valve: The aortic valve is tricuspid. Aortic valve regurgitation is mild. Aortic regurgitation PHT measures 447 msec. No aortic stenosis is present. Pulmonic Valve: The pulmonic valve was normal in structure. Pulmonic valve regurgitation is trivial. Aorta: Aortic dilatation noted. There is mild dilatation of the aortic root, measuring 39 mm. Venous: The inferior vena cava is dilated in size with less than 50% respiratory variability, suggesting right atrial pressure of 15 mmHg. IAS/Shunts: No atrial level shunt detected by color flow Doppler.  LEFT VENTRICLE PLAX 2D LVIDd:  6.40 cm LVIDs:         5.30 cm LV PW:         0.90 cm LV IVS:        0.70 cm LVOT diam:     2.30 cm LV SV:         47 LV SV Index:   21 LVOT Area:     4.15 cm  LV Volumes (MOD) LV vol d, MOD A4C: 190.0 ml LV vol s, MOD A4C: 142.0 ml LV SV MOD A4C:     190.0 ml RIGHT VENTRICLE RV S prime:     8.32 cm/s TAPSE (M-mode): 1.9 cm LEFT ATRIUM           Index        RIGHT ATRIUM           Index LA diam:      5.10 cm 2.32 cm/m   RA Area:     34.00 cm LA Vol (A2C): 77.7 ml 35.28 ml/m  RA Volume:   131.00 ml 59.49 ml/m LA Vol (A4C): 90.2 ml 40.96 ml/m  AORTIC VALVE LVOT Vmax:   65.50 cm/s LVOT Vmean:  49.400 cm/s LVOT VTI:    0.113 m AI PHT:      447 msec  AORTA Ao Root diam: 3.90 cm Ao Asc diam:  3.20 cm TRICUSPID VALVE TR Peak grad:   32.5 mmHg TR Vmax:        285.00 cm/s  SHUNTS Systemic VTI:  0.11 m Systemic Diam: 2.30 cm Dalton McleanMD Electronically signed by Franki Monte Signature Date/Time: 04/20/2021/4:34:35 PM    Final    Disposition   Pt is being discharged home today in good condition.  Follow-up Plans & Appointments     Follow-up Information     [name], MD Follow up.   Specialty: Family Medicine Why: Please contact your primary care provider to get your Coumadin level (INR) rechecked in 1 week. Contact  information: [address] [place] [address] [phone]         [name], MD Follow up.   Specialty: Cardiology Why: The cardiology office will be calling you to arrange follow-up in the Heart Failure clinic, Dr. [name]' office, as well as with our electrophysiology team. Please call the office if you have not heard back from anyone within 2 business days. Contact information: [address] [address] [phone]                Discharge Instructions     Diet - low sodium heart healthy   Complete by: As directed    Discharge instructions   Complete by: As directed    Please review this new medication list as several of your medicines have changed. There are several new prescriptions to review including empagliflozin, spironolactone, and torsemide. Your losartan dose is lower than previous prescription. We did send in a refill of your digoxin as well.  You will no longer take amiodarone and furosemide.  Famotidine was removed from your medicine list since you indicated you no longer take this.   Increase activity slowly   Complete by: As directed        Discharge Medications   Allergies as of 04/24/2021       Reactions   Dilaudid [hydromorphone Hcl] Other (See Comments)   Makes pt hyper    Hydromorphone Other (See Comments)   hyperactiviity Other reaction(s): made him wild   Morphine Sulfate  Other reaction(s): at high dose causes confusion   Tegretol [carbamazepine] Hives   Carbamazepine Hives, Rash   Other reaction(s): hives        Medication List     STOP taking these medications    amiodarone 200 MG tablet Commonly known as: PACERONE   famotidine 20 MG tablet Commonly known as: Pepcid   furosemide 40 MG tablet Commonly known as: LASIX       TAKE these medications    acetaminophen 500 MG tablet Commonly known as: TYLENOL Take 1,000 mg by mouth at bedtime as needed for mild pain or  headache.   Carboxymethylcellulose Sod PF 0.5 % Soln Place 1 drop into both eyes 4 (four) times daily as needed (dry eyes).   digoxin 0.125 MG tablet Commonly known as: LANOXIN Take 1 tablet (0.125 mg total) by mouth daily.   empagliflozin 10 MG Tabs tablet Commonly known as: JARDIANCE Take 1 tablet (10 mg total) by mouth daily.   escitalopram 20 MG tablet Commonly known as: LEXAPRO Take 20 mg by mouth daily.   finasteride 5 MG tablet Commonly known as: PROSCAR Take 5 mg by mouth daily at 12 noon.   fluticasone 50 MCG/ACT nasal spray Commonly known as: FLONASE Place 1 spray into both nostrils 2 (two) times daily.   loratadine 10 MG tablet Commonly known as: CLARITIN Take 10 mg by mouth daily.   losartan 25 MG tablet Commonly known as: COZAAR Take 1 tablet (25 mg total) by mouth daily. What changed:  medication strength how much to take   metoprolol succinate 100 MG 24 hr tablet Commonly known as: TOPROL-XL Take 1 tablet (100 mg total) by mouth daily. Take with or immediately following a meal.   multivitamin with minerals Tabs tablet Take 1 tablet by mouth daily at 12 noon.   PHENobarbital 97.2 MG tablet Commonly known as: LUMINAL Take 97.2 mg by mouth daily.   pravastatin 40 MG tablet Commonly known as: PRAVACHOL Take 40 mg by mouth at bedtime.   spironolactone 25 MG tablet Commonly known as: ALDACTONE Take 0.5 tablets (12.5 mg total) by mouth daily.   torsemide 20 MG tablet Commonly known as: DEMADEX Take 1 tablet (20 mg total) by mouth daily.   Vitamin D3 25 MCG (1000 UT) Caps Take 1,000 Units by mouth daily.   warfarin 5 MG tablet Commonly known as: COUMADIN Take 5 mg by mouth daily.           Outstanding Labs/Studies   In request sent to [facility] HF impact team, requested they obtain close BMET in f/u later this upcoming week.  Duration of Discharge Encounter   Greater than 30 minutes including physician time.  Signed, Charlie Pitter,  PA-C 04/24/2021, 9:42 AM

## 2021-04-24 NOTE — Progress Notes (Signed)
Patient amb. To bathroom H.R. goes up to 120-150 At. Fib. With no complaints. Back to bed and H.R. coming down to 94.

## 2021-04-24 NOTE — Progress Notes (Signed)
[place] for warfarin Indication: atrial fibrillation  Allergies  Allergen Reactions   Dilaudid [Hydromorphone Hcl] Other (See Comments)    Makes pt hyper    Hydromorphone Other (See Comments)    hyperactiviity Other reaction(s): made him wild   Morphine Sulfate     Other reaction(s): at high dose causes confusion   Tegretol [Carbamazepine] Hives   Carbamazepine Hives and Rash    Other reaction(s): hives    Patient Measurements: Height: _0  (172.7 cm) Weight: 103.3 kg (227 lb 11.8 oz) IBW/kg (Calculated) : 68.4   Vital Signs: Temp: 98 F (36.7 C) (02/19 0756) Temp Source: Oral (02/19 0756) BP: 100/63 (02/19 0756) Pulse Rate: 89 (02/19 0417)  Labs: Recent Labs    04/21/21 1320 04/22/21 0225 04/22/21 0449 04/23/21 0144 04/24/21 0614  LABPROT  --    < > 24.2* 22.3* 25.0*  INR  --    < > 2.2* 2.0* 2.3*  CREATININE 1.06  --   --  1.07  --    < > = values in this interval not displayed.     Estimated Creatinine Clearance: 67.4 mL/min (by C-G formula based on SCr of 1.07 mg/dL).   Medical History: Past Medical History:  Diagnosis Date   Acquired thrombophilia ([place]) 01/15/2019   Aortic atherosclerosis ([place])    Atrial fibrillation with RVR ([place]) 10/29/2017   CAP (community acquired pneumonia) 03/06/2016   Cardiomyopathy ([place])    a. EF 40-45% in 04/2016, etiology not defined, managed medically.   CHF exacerbation ([place]) 10/29/2017   Chronic laryngitis 10/03/2016   COPD GOLD II  08/02/2016   Quit smoking 2000  PFT's  07/10/2016  FEV1 1.98 (70 % ) ratio 67  p 19 % improvement from saba p nothing  prior to study while of coreg so rec as of 08/02/2016 try off coreg and on bisoprolol      Dysphonia 10/03/2016   Essential hypertension 03/06/2016   Changed from coreg to bisoprolol due to copd with reversible component  08/02/2016 >>>    History of cardioversion    Hoarseness 08/31/2016   Referred to ent 08/31/2016 >>> seen 10/03/16 [name] dx  gerd and rhinitis medicamentosa   > improved on f/u 01/31/17 on gerd rx/ flonase and off afrin   Hyperlipidemia    Hypertension    Laryngopharyngeal reflux (LPR) 10/03/2016   Moderate persistent asthma 08/02/2016   Morbid (severe) obesity due to excess calories ([place]) 08/02/2016   Myocardial infarction ([place]) 1980-early 2000s X 2   "mild ones" (03/06/2016)   Nasal polyps 10/03/2016   OSA on CPAP    a. pt states was told by Dr. Maxwell Caul that he would not require CPAP as long as he continued to sleep in recliner which he does for his back.   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   Rhinitis medicamentosa 10/03/2016   Right thyroid nodule 03/06/2016   Seizures ([place])    "take RX daily" (03/06/2016)   Sensorineural hearing loss (SNHL) of both ears 01/31/2017    Assessment: 19 YOM who presents with ongoing dyspnea, orthopnea and palpitations. Pharmacy consulted to continue PTA warfarin for hx of Afib.   PTA warfarin regimen = 5 mg daily   INR 2.3 therapeutic today. Received 5 mg x1 yesterday. Last CBC 2/15 stable.   Goal of Therapy:  INR 2-3 Monitor platelets by anticoagulation protocol: Yes   Plan:  Will not give warfarin dose before discharge today.  Laurey Arrow, PharmD PGY1 Pharmacy Resident 04/24/2021  8:58 AM  Please check [email or web address] for unit-specific pharmacy phone numbers.

## 2021-04-25 ENCOUNTER — Telehealth (HOSPITAL_COMMUNITY): Payer: Self-pay

## 2021-04-25 NOTE — Telephone Encounter (Signed)
Heart Failure Nurse Navigator Progress Note  Called pt regarding staff message from [place], cardiology PA to set up [facility] appt. Pt DC from hospital 2/19, needs BMET ~1 week, issues in past regarding medication expenses [name]).   Scheduled patient for Wednesday, March 1 @ 2pm per pt request as he has to drive in from [place], [place] (preferred no early appts). Gave directions and parking instructions.   Pt states he has picked up all but 2 of his medications, could not recall the name of the missing medications. Encouraged pt to call his pharmacy to get all prescriptions filled today. Pt verbalizes understanding.    Pricilla Holm, MSN, RN Heart Failure Nurse Navigator [phone]

## 2021-04-26 ENCOUNTER — Other Ambulatory Visit: Payer: Self-pay

## 2021-04-26 NOTE — Patient Instructions (Signed)
Patient Goals/Self-Care Activities: Take all medications as prescribed Attend all scheduled provider appointments call office if I gain more than 2 pounds in one day or 5 pounds in one week use salt in moderation watch for swelling in feet, ankles and legs every day weigh myself daily follow rescue plan if symptoms flare-up eat more whole grains, fruits and vegetables, lean meats and healthy fats

## 2021-04-26 NOTE — Patient Outreach (Signed)
[facility]) Care Management Telephonic RN Care Manager Note   04/26/2021 Name:  Ruben Murray MRN:  [id number] DOB:  04/30/1945  Summary: Telephone call to patient.  He was discharged from the hospital on 04-24-21 for heart failure.  Patient weight today 230 lbs.  Patient has moved to [place], [place] to be closer to family.  Niece Faythe Dingwall has been assisting him.  He gives permission to call her if needed.  Wife currently in rehab after breaking a hip and he not being able to care for her.  Hopes for her to return home.  Discussed his care and him getting better before his wife can come home.  He verbalized understanding.  PT to start with patient on tomorrow.    Recommendations/Changes made from today's visit: Discussed heart failure and management.  Discussed importance of weights and limiting salt intake.  Additionally discussed upcoming appointment on 05-04-21 with cardiology.    Subjective: Ruben Murray is an 76 y.o. year old male who is a primary patient of [name], MD. The care management team was consulted for assistance with care management and/or care coordination needs.    Telephonic RN Care Manager completed Telephone Visit today.  Objective:   Medications Reviewed Today     Reviewed by Jon Billings, RN (Case Manager) on 04/26/21 at [place] List Status: <None>   Medication Order Taking? Sig Documenting Provider Last Dose Status Informant  acetaminophen (TYLENOL) 500 MG tablet [id number] Yes Take 1,000 mg by mouth at bedtime as needed for mild pain or headache. [provider] Taking Active Other  Carboxymethylcellulose Sod PF 0.5 % SOLN [id number] Yes Place 1 drop into both eyes 4 (four) times daily as needed (dry eyes). [provider] Taking Active Other  Cholecalciferol (VITAMIN D3) 25 MCG (1000 UT) CAPS [id number] Yes Take 1,000 Units by mouth daily. [provider] Taking Active Other  digoxin (LANOXIN) 0.125 MG tablet  [id number] Yes Take 1 tablet (0.125 mg total) by mouth daily. Geralynn Rile, MD Taking Active   empagliflozin (JARDIANCE) 10 MG TABS tablet [id number] Yes Take 1 tablet (10 mg total) by mouth daily. Geralynn Rile, MD Taking Active   escitalopram (LEXAPRO) 20 MG tablet [id number] Yes Take 20 mg by mouth daily. [provider] Taking Active Other  finasteride (PROSCAR) 5 MG tablet [id number] Yes Take 5 mg by mouth daily at 12 noon.  [provider] Taking Active Other  fluticasone (FLONASE) 50 MCG/ACT nasal spray [id number] Yes Place 1 spray into both nostrils 2 (two) times daily. [provider] Taking Active Other           Med Note Thayer Ohm, Jarome Lamas   Fri Apr 22, 2021  8:52 AM)    loratadine (CLARITIN) 10 MG tablet [id number] Yes Take 10 mg by mouth daily. [provider] Taking Active Other  losartan (COZAAR) 25 MG tablet [id number] Yes Take 1 tablet (25 mg total) by mouth daily. Geralynn Rile, MD Taking Active   metoprolol succinate (TOPROL-XL) 100 MG 24 hr tablet [id number] Yes Take 1 tablet (100 mg total) by mouth daily. Take with or immediately following a meal. Dahal, Marlowe Aschoff, MD Taking Active Other  Multiple Vitamin (MULTIVITAMIN WITH MINERALS) TABS tablet [id number] Yes Take 1 tablet by mouth daily at 12 noon. [provider] Taking Active Other  PHENobarbital (LUMINAL) 97.2 MG tablet [id number] Yes Take 97.2 mg by mouth daily. [provider] Taking Active Other  pravastatin (PRAVACHOL) 40 MG tablet  [id number] Yes Take 40 mg by mouth at bedtime.  [provider] Taking Active Other  spironolactone (ALDACTONE) 25 MG tablet [id number] Yes Take 0.5 tablets (12.5 mg total) by mouth daily. Geralynn Rile, MD Taking Active   torsemide [facility]) 20 MG tablet [id number] Yes Take 1 tablet (20 mg total) by mouth daily. Geralynn Rile, MD Taking Active   warfarin (COUMADIN) 5 MG tablet [id number] Yes Take 5 mg by mouth  daily. [provider] Taking Active Other           Med Note Lodema Hong, COURTNEY A   Wed Apr 20, 2021 12:38 PM) Per RN from Dr. Roma Kayser office, the most recently prescribed dose of Warfarin is 56m QD. Patient's last INR was 2.6 at the end of January 2023.             SDOH:  (Social Determinants of Health) assessments and interventions performed:  SDOH Interventions    Flowsheet Row Most Recent Value  SDOH Interventions   Transportation Interventions Intervention Not Indicated        Care Plan  Review of patient past medical history, allergies, medications, health status, including review of consultants reports, laboratory and other test data, was performed as part of comprehensive evaluation for care management services.   Care Plan : Heart Failure (Adult)  Updates made by LJon Billings RN since 04/26/2021 12:00 AM  Completed 04/26/2021   Problem: Symptom Exacerbation (Heart Failure) Resolved 04/26/2021     Long-Range Goal: Symptom Exacerbation Prevented or Minimized as evidenced by patient reports no signs of worsening heart failure. Completed 04/26/2021  Start Date: 12/15/2019  Expected End Date: 11/03/2020  Recent Progress: On track  Priority: High  Note:   Evidence-based guidance:  Establish a mutually-agreed-upon early intervention process to communicate with primary care provider when signs/symptoms worsen.  Facilitate timely posthospital discharge or emergency department treatment that includes intensive follow-up via telephone calls, home visit, telehealth monitoring and care at multidisciplinary heart failure clinic.  Adjust frequency and intensity of follow-up based on presentation, number of emergency department visits, hospital admissions and frequency and severity of symptom exacerbation.   Notes:    Task: Identify and Minimize Risk of Heart Failure Exacerbation Completed 04/26/2021  Due Date: 11/03/2020  Priority: Routine  Responsible User: LJon Billings RN  Note:   Care Management Activities:    - healthy lifestyle promoted - rescue (action) plan reviewed    Notes: Patient managing with CHF.  Weighs daily and has no salt diet. 06/14/20 Patient weight 210 lbs. Patient reports follow up with PCP concerning weight loss.      Care Plan : RN Care Manager Plan of Care  Updates made by LJon Billings RN since 04/26/2021 12:00 AM     Problem: Chronic Disease Management and Care Coordination needs of HTN   Priority: High     Long-Range Goal: Development of Plan of care for management of Heart Failure   Start Date: 04/26/2021  Expected End Date: 03/05/2022  Priority: High  Note:   Current Barriers:  Chronic Disease Management support and education needs related to CHF   RNCM Clinical Goal(s):  Patient will verbalize understanding of plan for management of CHF as evidenced by patient reporting of daily weights.  take all medications exactly as prescribed and will call provider for medication related questions as evidenced by Patient reports of medication adherence attend all scheduled medical appointments: Heart Failure Clinic 05-05-22 as evidenced by Patient attending appointment  through collaboration with RN  Care manager, provider, and care team.   Interventions: Patient education and support related to heart failure Inter-disciplinary care team collaboration (see longitudinal plan of care) Evaluation of current treatment plan related to  self management and patient's adherence to plan as established by provider   Heart Failure Interventions:  (Status:  New goal.) Long Term Goal Provided education on low sodium diet Advised patient to weigh each morning after emptying bladder Discussed importance of daily weight and advised patient to weigh and record daily Discussed the importance of keeping all appointments with provider Provided patient with education about the role of exercise in the management of heart failure  Patient  Goals/Self-Care Activities: Take all medications as prescribed Attend all scheduled provider appointments call office if I gain more than 2 pounds in one day or 5 pounds in one week use salt in moderation watch for swelling in feet, ankles and legs every day weigh myself daily follow rescue plan if symptoms flare-up eat more whole grains, fruits and vegetables, lean meats and healthy fats  Follow Up Plan:  Telephone follow up appointment with care management team member scheduled for:  in the next 2 weeks       Plan:  Telephone follow up appointment with care management team member scheduled for:  within 2 weeks. Patient agreeable to care plan and follow up.  [name], RN, MSN [facility] Care Management Care Management Coordinator Direct Line [phone] Toll Free: [phone]  Fax: [phone]

## 2021-05-02 DIAGNOSIS — Z7901 Long term (current) use of anticoagulants: Secondary | ICD-10-CM | POA: Diagnosis not present

## 2021-05-02 DIAGNOSIS — Z09 Encounter for follow-up examination after completed treatment for conditions other than malignant neoplasm: Secondary | ICD-10-CM | POA: Diagnosis not present

## 2021-05-02 DIAGNOSIS — I77810 Thoracic aortic ectasia: Secondary | ICD-10-CM | POA: Diagnosis not present

## 2021-05-02 DIAGNOSIS — I4821 Permanent atrial fibrillation: Secondary | ICD-10-CM | POA: Diagnosis not present

## 2021-05-02 DIAGNOSIS — I5023 Acute on chronic systolic (congestive) heart failure: Secondary | ICD-10-CM | POA: Diagnosis not present

## 2021-05-03 ENCOUNTER — Ambulatory Visit (INDEPENDENT_AMBULATORY_CARE_PROVIDER_SITE_OTHER): Payer: Medicare PPO | Admitting: Cardiology

## 2021-05-03 ENCOUNTER — Encounter: Payer: Self-pay | Admitting: *Deleted

## 2021-05-03 ENCOUNTER — Other Ambulatory Visit: Payer: Self-pay

## 2021-05-03 ENCOUNTER — Encounter: Payer: Self-pay | Admitting: Cardiology

## 2021-05-03 VITALS — BP 110/70 | HR 84 | Ht 68.0 in | Wt 238.4 lb

## 2021-05-03 DIAGNOSIS — I4821 Permanent atrial fibrillation: Secondary | ICD-10-CM | POA: Diagnosis not present

## 2021-05-03 DIAGNOSIS — I5022 Chronic systolic (congestive) heart failure: Secondary | ICD-10-CM | POA: Diagnosis not present

## 2021-05-03 NOTE — Progress Notes (Signed)
Electrophysiology Office Note   Date:  05/03/2021   ID:  Ruben, Murray 08-07-1945, MRN [id number]  PCP:  [name], MD  Cardiologist:  [name] Primary Electrophysiologist:  Gerson Fauth Meredith Leeds, MD    Chief Complaint: AF   History of Present Illness: Ruben Murray is a 76 y.o. male who is being seen today for the evaluation of AF at the request of [name]. Presenting today for electrophysiology evaluation.  He has a history significant for obesity, hypertension, sleep apnea, seizures.  January 2020 he was treated for community-acquired pneumonia and was found to be in atrial fibrillation.  He had a failed cardioversion.  He had a repeat cardioversion 11/04/2018 after being loaded on amiodarone.  He is currently on warfarin.  February 2671 with systolic heart failure.  He was noted to be in rapid atrial fibrillation at the time.  He was diuresed.  His ejection fraction was found to be 20 to 25% which was consistent with his prior echoes.    Today, denies symptoms of palpitations, chest pain, orthopnea, PND, lower extremity edema, claudication, dizziness, presyncope, syncope, bleeding, or neurologic sequela. The patient is tolerating medications without difficulties.  Today he remains short of breath.  He has no chest pain, but has been short of breath with exertion.  He feels about the same as he did when he left the hospital.   Past Medical History:  Diagnosis Date   Acquired thrombophilia ([place]) 01/15/2019   Aortic atherosclerosis ([place])    Aortic root dilatation (HCC)    CAP (community acquired pneumonia) 03/06/2016   Chronic laryngitis 24/58/0998   Chronic systolic CHF (congestive heart failure) ([place])    COPD GOLD II  08/02/2016   Quit smoking 2000  PFT's  07/10/2016  FEV1 1.98 (70 % ) ratio 67  p 19 % improvement from saba p nothing  prior to study while of coreg so rec as of 08/02/2016 try off coreg and on bisoprolol      Essential hypertension 03/06/2016   Changed  from coreg to bisoprolol due to copd with reversible component  08/02/2016 >>>    Hoarseness 08/31/2016   Referred to ent 08/31/2016 >>> seen 10/03/16 [name] dx gerd and rhinitis medicamentosa   > improved on f/u 01/31/17 on gerd rx/ flonase and off afrin   Hyperlipidemia    Hypertension    Laryngopharyngeal reflux (LPR) 10/03/2016   Mitral regurgitation    Moderate persistent asthma 08/02/2016   Morbid (severe) obesity due to excess calories ([place]) 08/02/2016   Nasal polyps 10/03/2016   NICM (nonischemic cardiomyopathy) ([place])    a. EF 40-45% in 04/2016, etiology not defined, managed medically.   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   Rhinitis medicamentosa 10/03/2016   Right thyroid nodule 03/06/2016   Seizures ([place])    "take RX daily" (03/06/2016)   Sensorineural hearing loss (SNHL) of both ears 01/31/2017   Tricuspid regurgitation    Past Surgical History:  Procedure Laterality Date   CARDIOVERSION N/A 12/13/2017   Procedure: CARDIOVERSION;  Surgeon: [name], MD;  Location: [facility];  Service: Cardiovascular;  Laterality: N/A;   CARDIOVERSION N/A 09/25/2018   Procedure: CARDIOVERSION;  Surgeon: [name], MD;  Location: [facility] ENDOSCOPY;  Service: Cardiovascular;  Laterality: N/A;   CARDIOVERSION N/A 11/04/2018   Procedure: CARDIOVERSION;  Surgeon: [name], MD;  Location[facility]  ENDOSCOPY;  Service: Cardiovascular;  Laterality: N/A;   CARDIOVERSION N/A 04/11/2019   Procedure: CARDIOVERSION;  Surgeon: [name], MD;  Location: [facility] ENDOSCOPY;  Service: Cardiovascular;  Laterality: N/A;   CIRCUMCISION     JOINT REPLACEMENT     PERCUTANEOUS PINNING TOE FRACTURE Left    "big toe   REPLACEMENT TOTAL KNEE Left    RIGHT/LEFT HEART CATH AND CORONARY ANGIOGRAPHY N/A 11/06/2019   Procedure: RIGHT/LEFT HEART CATH AND CORONARY ANGIOGRAPHY;  Surgeon: [name], MD;   Location: [facility] CV LAB;  Service: Cardiovascular;  Laterality: N/A;   TONSILLECTOMY AND ADENOIDECTOMY       Current Outpatient Medications  Medication Sig Dispense Refill   acetaminophen (TYLENOL) 500 MG tablet Take 1,000 mg by mouth at bedtime as needed for mild pain or headache.     Carboxymethylcellulose Sod PF 0.5 % SOLN Place 1 drop into both eyes 4 (four) times daily as needed (dry eyes).     Cholecalciferol (VITAMIN D3) 25 MCG (1000 UT) CAPS Take 1,000 Units by mouth daily.     digoxin (LANOXIN) 0.125 MG tablet Take 1 tablet (0.125 mg total) by mouth daily. 3 tablet 5   empagliflozin (JARDIANCE) 10 MG TABS tablet Take 1 tablet (10 mg total) by mouth daily. 30 tablet 5   escitalopram (LEXAPRO) 20 MG tablet Take 20 mg by mouth daily.     finasteride (PROSCAR) 5 MG tablet Take 5 mg by mouth daily at 12 noon.      fluticasone (FLONASE) 50 MCG/ACT nasal spray Place 1 spray into both nostrils 2 (two) times daily.     loratadine (CLARITIN) 10 MG tablet Take 10 mg by mouth daily.     losartan (COZAAR) 25 MG tablet Take 1 tablet (25 mg total) by mouth daily. 30 tablet 5   metoprolol succinate (TOPROL-XL) 100 MG 24 hr tablet Take 1 tablet (100 mg total) by mouth daily. Take with or immediately following a meal. 90 tablet 0   Multiple Vitamin (MULTIVITAMIN WITH MINERALS) TABS tablet Take 1 tablet by mouth daily at 12 noon.     PHENobarbital (LUMINAL) 97.2 MG tablet Take 97.2 mg by mouth daily.     pravastatin (PRAVACHOL) 40 MG tablet Take 40 mg by mouth at bedtime.      spironolactone (ALDACTONE) 25 MG tablet Take 0.5 tablets (12.5 mg total) by mouth daily. 15 tablet 5   torsemide (DEMADEX) 20 MG tablet Take 1 tablet (20 mg total) by mouth daily. 30 tablet 5   warfarin (COUMADIN) 5 MG tablet Take 5 mg by mouth daily.     No current facility-administered medications for this visit.    Allergies:   Dilaudid [hydromorphone hcl], Hydromorphone, Morphine sulfate, Tegretol [carbamazepine],  and Carbamazepine   Social History:  The patient  reports that he quit smoking about 23 years ago. His smoking use included cigarettes. He has a 144.00 pack-year smoking history. He has never used smokeless tobacco. He reports that he does not drink alcohol and does not use drugs.   Family History:  The patient's family history includes Hypertension in his mother; Other in his father.   ROS:  Please see the history of present illness.   Otherwise, review of systems is positive for none.   All other systems are reviewed and negative.   PHYSICAL EXAM: VS:  BP 110/70    Pulse 84    Ht _0  (1.727 m)    Wt 238 lb 6.4 oz (108.1 kg)  SpO2 96%    BMI 36.25 kg/m  , BMI Body mass index is 36.25 kg/m. GEN: Well nourished, well developed, in no acute distress  HEENT: normal  Neck: no JVD, carotid bruits, or masses Cardiac: irregular; no murmurs, rubs, or gallops,no edema  Respiratory:  clear to auscultation bilaterally, normal work of breathing GI: soft, nontender, nondistended, + BS MS: no deformity or atrophy  Skin: warm and dry Neuro:  Strength and sensation are intact Psych: euthymic mood, full affect  EKG:  EKG is not ordered today. Personal review of the ekg ordered 04/19/21 shows atrial fibrillation, rate 110  Recent Labs: 04/19/2021: ALT 25; B Natriuretic Peptide 542.1; TSH 1.898 04/20/2021: Hemoglobin 12.3; Platelets 152 04/21/2021: Magnesium 1.8 04/23/2021: BUN 19; Creatinine, Ser 1.07; Potassium 4.9; Sodium 134    Lipid Panel     Component Value Date/Time   CHOL 122 04/20/2021 0210   TRIG 60 04/20/2021 0210   HDL 37 (L) 04/20/2021 0210   CHOLHDL 3.3 04/20/2021 0210   VLDL 12 04/20/2021 0210   LDLCALC 73 04/20/2021 0210     Wt Readings from Last 3 Encounters:  05/03/21 238 lb 6.4 oz (108.1 kg)  04/24/21 227 lb 11.8 oz (103.3 kg)  03/16/20 234 lb (106.1 kg)      Other studies Reviewed: Additional studies/ records that were reviewed today include: TTE 04/20/21 Review  of the above records today demonstrates:   1. Left ventricular ejection fraction, by estimation, is 20 to 25%. The  left ventricle has severely decreased function. The left ventricle  demonstrates global hypokinesis. The left ventricular internal cavity size  was moderately dilated. Left  ventricular diastolic parameters are indeterminate.   2. Right ventricular systolic function is mildly reduced. The right  ventricular size is mildly enlarged. There is moderately elevated  pulmonary artery systolic pressure. The estimated right ventricular  systolic pressure is 24.2 mmHg.   3. Left atrial size was moderately dilated.   4. Right atrial size was moderately dilated.   5. The mitral valve is normal in structure. Mild mitral valve  regurgitation. No evidence of mitral stenosis.   6. Tricuspid valve regurgitation is mild to moderate.   7. The aortic valve is tricuspid. Aortic valve regurgitation is mild. No  aortic stenosis is present.   8. Aortic dilatation noted. There is mild dilatation of the aortic root,  measuring 39 mm.   9. The inferior vena cava is dilated in size with <50% respiratory  variability, suggesting right atrial pressure of 15 mmHg.    ASSESSMENT AND PLAN:  1.  Permanent atrial fibrillation: Currently on digoxin 0.125 mg daily, Toprol-XL 100 mg daily, warfarin.  CHA2DS2-VASc of 4.  He is unfortunately had more frequent episodes of atrial fibrillation with rapid rates.  This has been difficult to control and he is on high doses of metoprolol.  His blood pressure is also limiting further titration.  If he has further episodes of rapid atrial fibrillation, AV node ablation would potentially be warranted.  2.  Obstructive sleep apnea: CPAP compliance encouraged  3.  Chronic systolic heart failure: Currently on Aldactone 12.5 mg daily, losartan 25 mg daily, Toprol XL 100 mg daily.  He remains short of breath, though he does not appear volume overloaded.  He does have atrial  fibrillation, and may need an AV node ablation in the future.  We Ruben Murray plan on CRT D implant as he would need LV pacing if AV node ablation occurred.  Risk and benefits of been discussed  which include bleeding, tamponade, infection, pneumothorax.  He understands these risks and is agreed to the procedure.  Case discussed with primary cardiology   Current medicines are reviewed at length with the patient today.   The patient does not have concerns regarding his medicines.  The following changes were made today: none  Labs/ tests ordered today include:  No orders of the defined types were placed in this encounter.    Disposition:   FU with Ruben Murray 3 months  Signed, Ruben Murray Meredith Leeds, MD  05/03/2021 11:35 AM     [facility] [address] [place] [address] [phone] (office) [phone] (fax)

## 2021-05-03 NOTE — Patient Instructions (Signed)
Medication Instructions:  Your physician recommends that you continue on your current medications as directed. Please refer to the Current Medication list given to you today.  *If you need a refill on your cardiac medications before your next appointment, please call your pharmacy*   Lab Work: None ordered If you have labs (blood work) drawn today and your tests are completely normal, you will receive your results only by: [place] (if you have MyChart) OR A paper copy in the mail If you have any lab test that is abnormal or we need to change your treatment, we will call you to review the results.   Testing/Procedures: CRT or cardiac resynchronization therapy is a treatment used to correct heart failure. When you have heart failure your heart is weakened and doesnt pump as well as it should. This therapy may help reduce symptoms and improve the quality of life.    Please see instruction letter      Follow-Up: At [facility], you and your health needs are our priority.  As part of our continuing mission to provide you with exceptional heart care, we have created designated Provider Care Teams.  These Care Teams include your primary Cardiologist (physician) and Advanced Practice Providers (APPs -  Physician Assistants and Nurse Practitioners) who all work together to provide you with the care you need, when you need it.  We recommend signing up for the patient portal called "MyChart".  Sign up information is provided on this After Visit Summary.  MyChart is used to connect with patients for Virtual Visits (Telemedicine).  Patients are able to view lab/test results, encounter notes, upcoming appointments, etc.  Non-urgent messages can be sent to your provider as well.   To learn more about what you can do with MyChart, go to [email or web address].    Your next appointment:   7-10 day  after your procedure  The format for your next appointment:   In Person  Provider:    Device clinic for a wound check     Thank you for choosing [facility]!!   Trinidad Curet, RN [phone]   Other Instructions

## 2021-05-04 ENCOUNTER — Ambulatory Visit (HOSPITAL_COMMUNITY)
Admission: RE | Admit: 2021-05-04 | Discharge: 2021-05-04 | Disposition: A | Discharge status: Home / Self Care | Payer: Medicare PPO | Source: Ambulatory Visit | Attending: Cardiology | Admitting: Cardiology

## 2021-05-04 ENCOUNTER — Encounter (HOSPITAL_COMMUNITY): Payer: Self-pay

## 2021-05-04 VITALS — BP 138/80 | HR 105 | Wt 238.4 lb

## 2021-05-04 DIAGNOSIS — Z9989 Dependence on other enabling machines and devices: Secondary | ICD-10-CM | POA: Diagnosis not present

## 2021-05-04 DIAGNOSIS — Z7901 Long term (current) use of anticoagulants: Secondary | ICD-10-CM | POA: Insufficient documentation

## 2021-05-04 DIAGNOSIS — I4821 Permanent atrial fibrillation: Secondary | ICD-10-CM | POA: Insufficient documentation

## 2021-05-04 DIAGNOSIS — M1990 Unspecified osteoarthritis, unspecified site: Secondary | ICD-10-CM | POA: Insufficient documentation

## 2021-05-04 DIAGNOSIS — I110 Hypertensive heart disease with heart failure: Secondary | ICD-10-CM | POA: Diagnosis not present

## 2021-05-04 DIAGNOSIS — G4733 Obstructive sleep apnea (adult) (pediatric): Secondary | ICD-10-CM | POA: Insufficient documentation

## 2021-05-04 DIAGNOSIS — J449 Chronic obstructive pulmonary disease, unspecified: Secondary | ICD-10-CM | POA: Insufficient documentation

## 2021-05-04 DIAGNOSIS — I428 Other cardiomyopathies: Secondary | ICD-10-CM | POA: Insufficient documentation

## 2021-05-04 DIAGNOSIS — I272 Pulmonary hypertension, unspecified: Secondary | ICD-10-CM | POA: Diagnosis not present

## 2021-05-04 DIAGNOSIS — M2550 Pain in unspecified joint: Secondary | ICD-10-CM | POA: Insufficient documentation

## 2021-05-04 DIAGNOSIS — I509 Heart failure, unspecified: Secondary | ICD-10-CM | POA: Diagnosis not present

## 2021-05-04 DIAGNOSIS — I4892 Unspecified atrial flutter: Secondary | ICD-10-CM | POA: Diagnosis not present

## 2021-05-04 DIAGNOSIS — Z79899 Other long term (current) drug therapy: Secondary | ICD-10-CM | POA: Insufficient documentation

## 2021-05-04 DIAGNOSIS — Z7984 Long term (current) use of oral hypoglycemic drugs: Secondary | ICD-10-CM | POA: Diagnosis not present

## 2021-05-04 DIAGNOSIS — I5022 Chronic systolic (congestive) heart failure: Secondary | ICD-10-CM | POA: Insufficient documentation

## 2021-05-04 LAB — BRAIN NATRIURETIC PEPTIDE: B Natriuretic Peptide: 902.3 pg/mL — ABNORMAL HIGH (ref 0.0–100.0)

## 2021-05-04 LAB — BASIC METABOLIC PANEL
Anion gap: 9 (ref 5–15)
BUN: 19 mg/dL (ref 8–23)
CO2: 27 mmol/L (ref 22–32)
Calcium: 8.7 mg/dL — ABNORMAL LOW (ref 8.9–10.3)
Chloride: 106 mmol/L (ref 98–111)
Creatinine, Ser: 1.03 mg/dL (ref 0.61–1.24)
GFR, Estimated: >60 mL/min (ref >60)
Glucose, Bld: 93 mg/dL (ref 70–99)
Potassium: 4.7 mmol/L (ref 3.5–5.1)
Sodium: 142 mmol/L (ref 135–145)

## 2021-05-04 LAB — DIGOXIN LEVEL: Digoxin Level: <0.2 ng/mL — ABNORMAL LOW (ref 0.8–2.0)

## 2021-05-04 NOTE — Progress Notes (Signed)
ReDS Vest / Clip - 05/04/21 1400   ? ?  ? ReDS Vest / Clip  ? Station Marker D   ? Ruler Value 37   ? ReDS Value Range Low volume   ? ReDS Actual Value 22   ? ?  ?  ? ?  ? ? ?

## 2021-05-04 NOTE — Progress Notes (Signed)
HEART & VASCULAR TRANSITION OF CARE CONSULT NOTE     Referring Physician:Dr. O'Neal  Primary Care: [name], MD Primary Cardiologist: [name], MD   HPI: Referred to clinic by Dr. Audie Box for heart failure consultation.   76 y/o male w/ chronic systolic heart failure, secondary to nonischemic CM and dating back since 2018. Suspected tachymediated CM from long history of poorly controlled, Afib/Flutter, failing multiple cardioversions and AAD therapies. On chronic coumadin for a/c.  Also h/o obesity, COPD, OSA on CPAP and hypertension.   Echo history/ cath data below  Echo 04/2016 EF 40-45%, RV normal, mod TR Echo 10/2017 EF 25%, RV moderately reduced (in Afib) Echo 03/2018 EF 40-45%, RV mildly dilated, systolic fx normal  Echo 07/1882 EF 20-25%, RV moderately reduced, RVSP 38, Mild MR, mild TR LHC 11/2019 Normal coronaries, RHC mild pulmonary hypertension; PA 34/22, mean PA pressure 28 mmHg  Echo 04/2021 EF 20-25%, RV mildly reduced, RVSP 48, mild MR, mild TR   Recent admit 2/23 for a/c CHF in the setting of rapid afib. Echo showed EF 20-25%, RV mildly reduced, RVSP 48 mmHg, mild MR, mild TR.   He was diuresed w/ IV Lasix. Afib treated w/ rate control, w/ metoprolol and digoxin. Amiodarone discontinued given chronic afib. V-rate improved in the 90s.  After diuresis, he was transition to PO torsemide. GDMT added. Instructed to f/u w/ EP post hospital and referred to [facility] clinic. D/w Wt charted at 227 lb.   Seen by Dr. Curt Bears yesterday. Plan is for CRT-D implant w/ plans to consider AV node ablation if further issues w/ rapid afib.   Today in clinic, he continues to endorse exertional dyspnea. NYHA Class III. No resting dyspnea. Denies orthopnea/PND. Compliant w/ CPAP. Wt appears up post hospital, from 227>>238 lb, however he reports wt has been fairly stable on home scale ~226lb. ? If actual recorded wt day of d/c was accurate. ReDs clip measurement normal at 22%. Reports full  compliance w/ meds w/ robust UOP. Complains of b/l ankle swellling. Hard to get shoes on. BP 138/80. Pulse rate 105 bpm. He admits to dietary indiscretion w/ high sodium foods.   We checked labs in clinic today. BNP elevated at 902 (up from hospital value, 542). K 4.7, SCr 1.03 (stable). Dig level <0.2.   Of note, he had previously been on Entresto in the past but unable to continue due to cost.    Cardiac Testing  Echo 04/2016 EF 40-45%, RV normal, mod TR Echo 10/2017 EF 25%, RV moderately reduced (in Afib) Echo 03/2018 EF 40-45%, RV mildly dilated, systolic fx normal  Echo 03/6604 EF 20-25%, RV moderately reduced, RVSP 38, Mild MR, mild TR LHC 11/2019 Normal coronaries, RHC mild pulmonary hypertension; PA 34/22, mean PA pressure 28  Echo 04/2021 EF 20-25%, RV mildly reduced, RVSP 48, mild MR, mild TR   Review of Systems: [y] = yes, _0  = no   General: Weight gain [ Y]; Weight loss _1 ; Anorexia _2 ; Fatigue [Y ]; Fever _3 ; Chills _4 ; Weakness _5   Cardiac: Chest pain/pressure _6 ; Resting SOB _7 ; Exertional SOB [ Y]; Orthopnea _8 ; Pedal Edema [ Y]; Palpitations _9 ; Syncope _10 ; Presyncope _11 ; Paroxysmal nocturnal dyspnea_12   Pulmonary: Cough _13 ; Wheezing_14 ; Hemoptysis_15 ; Sputum _16 ; Snoring [Y]  GI: Vomiting_17 ; Dysphagia_18 ; Melena_19 ; Hematochezia _20 ; Heartburn_21 ; Abdominal pain _22 ; Constipation _23 ; Diarrhea _24 ; BRBPR _25   GU: Hematuria_0 ; Dysuria _1 ; Nocturia_2   Vascular: Pain in legs with walking _3 ; Pain in feet with lying flat _4 ; Non-healing sores _5 ; Stroke _6 ; TIA _7 ; Slurred speech _8 ;  Neuro: Headaches_9 ; Vertigo_10 ; Seizures_11 ; Paresthesias_12 ;Blurred vision _13 ; Diplopia _14 ; Vision changes _15   Ortho/Skin: Arthritis [ Y]; Joint pain [ Y]; Muscle pain _16 ; Joint swelling _17 ; Back Pain _18 ; Rash _19   Psych: Depression_20 ; Anxiety_21   Heme: Bleeding problems _22 ; Clotting disorders _23 ; Anemia _24   Endocrine: Diabetes _25 ; Thyroid dysfunction_26    Past  Medical History:  Diagnosis Date   Acquired thrombophilia ([place]) 01/15/2019   Aortic atherosclerosis ([place])    Aortic root dilatation (HCC)    CAP (community acquired pneumonia) 03/06/2016   Chronic laryngitis 55/73/2202   Chronic systolic CHF (congestive heart failure) ([place])    COPD GOLD II  08/02/2016   Quit smoking 2000  PFT's  07/10/2016  FEV1 1.98 (70 % ) ratio 67  p 19 % improvement from saba p nothing  prior to study while of coreg so rec as of 08/02/2016 try off coreg and on bisoprolol      Essential hypertension 03/06/2016   Changed from coreg to bisoprolol due to copd with reversible component  08/02/2016 >>>    Hoarseness 08/31/2016   Referred to ent 08/31/2016 >>> seen 10/03/16 [name] dx gerd and rhinitis medicamentosa   > improved on f/u 01/31/17 on gerd rx/ flonase and off afrin   Hyperlipidemia    Hypertension    Laryngopharyngeal reflux (LPR) 10/03/2016   Mitral regurgitation    Moderate persistent asthma 08/02/2016   Morbid (severe) obesity due to excess calories ([place]) 08/02/2016   Nasal polyps 10/03/2016   NICM (nonischemic cardiomyopathy) ([place])    a. EF 40-45% in 04/2016, etiology not defined, managed medically.   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     Carboxymethylcellulose Sod PF 0.5 % SOLN Place 1 drop into both eyes 4 (four) times daily as needed (dry eyes).     Cholecalciferol (VITAMIN D3) 25 MCG (1000 UT) CAPS Take 1,000 Units by mouth daily.     digoxin (LANOXIN) 0.125 MG tablet Take 1  tablet (0.125 mg total) by mouth daily. 3 tablet 5   empagliflozin (JARDIANCE) 10 MG TABS tablet Take 1 tablet (10 mg total) by mouth daily. 30 tablet 5   escitalopram (LEXAPRO) 20 MG tablet Take 20 mg by mouth daily.     finasteride (PROSCAR) 5 MG tablet Take 5 mg by mouth daily at 12 noon.      fluticasone (FLONASE) 50 MCG/ACT nasal spray Place 1 spray into both nostrils 2 (two) times daily.     loratadine (CLARITIN) 10 MG tablet Take 10 mg by mouth daily.     losartan (COZAAR) 25 MG tablet Take 1 tablet (25 mg total) by mouth daily. 30 tablet 5  metoprolol succinate (TOPROL-XL) 100 MG 24 hr tablet Take 1 tablet (100 mg total) by mouth daily. Take with or immediately following a meal. 90 tablet 0   Multiple Vitamin (MULTIVITAMIN WITH MINERALS) TABS tablet Take 1 tablet by mouth daily at 12 noon.     PHENobarbital (LUMINAL) 97.2 MG tablet Take 97.2 mg by mouth daily.     pravastatin (PRAVACHOL) 40 MG tablet Take 40 mg by mouth at bedtime.      spironolactone (ALDACTONE) 25 MG tablet Take 0.5 tablets (12.5 mg total) by mouth daily. 15 tablet 5   torsemide (DEMADEX) 20 MG tablet Take 1 tablet (20 mg total) by mouth daily. 30 tablet 5   warfarin (COUMADIN) 5 MG tablet Take 5 mg by mouth daily.     No current facility-administered medications for this encounter.    Allergies  Allergen Reactions   Dilaudid [Hydromorphone Hcl] Other (See Comments)    Makes pt hyper    Hydromorphone Other (See Comments)    hyperactiviity Other reaction(s): made him wild   Morphine Sulfate     Other reaction(s): at high dose causes confusion   Tegretol [Carbamazepine] Hives   Carbamazepine Hives and Rash    Other reaction(s): hives      Social History   Socioeconomic History   Marital status: Married    Spouse name: Not on file   Number of children: Not on file   Years of education: Not on file   Highest education level: Not on file  Occupational History   Not on file  Tobacco Use   Smoking  status: Former    Packs/day: 3.00    Years: 48.00    Pack years: 144.00    Types: Cigarettes    Quit date: 2000    Years since quitting: 23.1   Smokeless tobacco: Never  Vaping Use   Vaping Use: Never used  Substance and Sexual Activity   Alcohol use: Never   Drug use: Never   Sexual activity: Never  Other Topics Concern   Not on file  Social History Narrative   ** Merged History Encounter **       Social Determinants of Health   Financial Resource Strain: Not on file  Food Insecurity: Not on file  Transportation Needs: No Transportation Needs   Lack of Transportation (Medical): No   Lack of Transportation (Non-Medical): No  Physical Activity: Not on file  Stress: Not on file  Social Connections: Not on file  Intimate Partner Violence: Not on file      Family History  Problem Relation Age of Onset   Hypertension Mother    Other Father        sudden cardiac arrest    Vitals:   05/04/21 1405  BP: 138/80  Pulse: (!) 105  SpO2: 98%  Weight: 108.1 kg (238 lb 6.4 oz)    PHYSICAL EXAM: (ReDs Clip, 22% ? Accuracy) General:  elderly WM, ambulating w/ walker. No respiratory difficulty HEENT: normal Neck: supple. Thick neck, JVD not well visualized. Carotids 2+ bilat; no bruits. No lymphadenopathy or thryomegaly appreciated. Cor: PMI nondisplaced. Irregularly irregular rhythm and rate. No rubs, gallops or murmurs. Lungs: decreased BS at the bases, no wheezing  Abdomen: obese, soft, nontender, nondistended. No hepatosplenomegaly. No bruits or masses. Good bowel sounds. Extremities: no cyanosis, clubbing, rash, 1+ b/l ankle edema Neuro: alert & oriented x 3, cranial nerves grossly intact. moves all 4 extremities w/o difficulty. Affect pleasant.  ECG: Not performed, chronic Afib.  ASSESSMENT & PLAN:  1. Chronic Systolic Heart Failure - NICM. Dates back to at least 2018, EFs have fluctuated from 20-40% - most recent echo 2/22 EF 20-25%, RV moderately reduced -  LHC in 2021 showed normal coronaries - suspect tachy mediated CM from long history of chronic, poorly rate controlled Afib/flutter  - Planning CRT-D next month w/ Dr. Curt Bears. QRS 151 ms  - Agree that he may benefit from AV nodal ablation, as he has not tolerated rapid Afib well, in setting of severe LV dysfunction  - NYHA Class III. Per recorded D/c wt, his wt appears to be up but he reports wt has been stable at home since d/c ~226 lb. ReDs Clip 22% but doubt accurate. Volume assessment difficult on exam due to body habitus but some LEE noted. We checked BNP and its elevated at 902, well above  hospital level (542). BMP shows stable SCr 1.03, K 4.3  - Increase torsemide to 40 mg daily and will bring back for f/u TOC appt to reassess repsonse.   - Continue Jardiance 10 mg daily  - Increase Spiro to 25 mg daily  - Continue Losartan 25 mg daily. Not on Entresto due to cost - Continue Toprol XL 100 mg daily  - Continue Digoxin 0.125 mg daily. Dig level <0.2  - F/u in [facility] clinic 7-10 days. Will need repeat BMP    2. Permanent Atrial Fibrillation  - failed multiple cardioversions and AAD therapy  - issues w/ Rapid Afib in the past, complicating HF - currently under decent control w/ metoprolol and digoxin  - followed by Dr. Curt Bears w/ plans to implant CRT-D +/- AV nodal ablation next month - on chronic coumadin. DOACs too expensive. INRs followed by coumadin clinic   3. Pulmonary Hypertension  - suspect combination WHO Group 2 and 3 - PFTs in 2018 showed moderate OAD. Also w/ OSA  - continue tx w/ CPAP  - diuretics per above   4. OSA  - stressed importance of CPAP   5. Essential HTN - BP controlled on current regimen - GDMT per above. Increasing spiro  - Repeat BMP in 7-10 days   NYHA III GDMT  Diuretic- torsemide 40 mg daily  BB- Toprol XL 100 mg daily  Ace/ARB/ARNI - losartan 25 mg daily (Entresto too expensive) MRA Spiro 25 mg daily  SGLT2i Jardiance 10 mg daily      Referred to HFSW (PCP, Medications, Transportation, ETOH Abuse, Drug Abuse, Insurance, Financial ): Yes or No Refer to Pharmacy: Yes or No Refer to Home Health: Yes on No Refer to Advanced Heart Failure Clinic: Yes or no  Refer to General Cardiology: Yes or No  Follow up  in [facility] clinic to reassess volume status in 7-10 days. Refer back to cardiology after that visit. Will arrange f/u w/ Dr. [name], post CRT-D implant, which will be in ~6 wks

## 2021-05-04 NOTE — Patient Instructions (Addendum)
Good to see you today! ? ?No medication changes today ? ?Lab work done today we will call you with any abnormal results ? ?You have been referred to Dr. [name] at [facility] Cardiology office. They will call you to schedule your appointment ? ? ? ?  ? ? ?

## 2021-05-05 ENCOUNTER — Telehealth (HOSPITAL_COMMUNITY): Payer: Self-pay

## 2021-05-05 MED ORDER — SPIRONOLACTONE 25 MG PO TABS: 25.0000 mg | ORAL_TABLET | Freq: Every day | ORAL | 5 refills | Status: DC

## 2021-05-05 MED ORDER — TORSEMIDE 20 MG PO TABS: 40.0000 mg | ORAL_TABLET | Freq: Every day | ORAL | 5 refills | Status: DC

## 2021-05-05 NOTE — Telephone Encounter (Signed)
-----  Message from Consuelo Pandy, [place] sent at 05/04/2021  5:09 PM EST ----- ?Fluid marker (BNP) is elevated. Need to increase diuretics to help get rid of fluid/ankle  swelling and help improve breathing.  ? ?Increase torsemide to 40 mg daily and increase Spiro to 25 mg daily.  ? ?Arrange return visit to [facility] clinic in 7-10 days to reassess volume status and repeat labs.  ?

## 2021-05-05 NOTE — Telephone Encounter (Signed)
Patient advised and verbalized understanding. Med list updated to reflect changes. Fu appt scheduled.  ? ?Meds ordered this encounter  ?Medications  ? spironolactone (ALDACTONE) 25 MG tablet  ?  Sig: Take 1 tablet (25 mg total) by mouth daily.  ?  Dispense:  30 tablet  ?  Refill:  5  ?  Please cancel all previous orders for current medication. Change in dosage or pill size.  ? torsemide (DEMADEX) 20 MG tablet  ?  Sig: Take 2 tablets (40 mg total) by mouth daily.  ?  Dispense:  60 tablet  ?  Refill:  5  ?  Please cancel all previous orders for current medication. Change in dosage or pill size.  ? ? ?

## 2021-05-06 NOTE — Progress Notes (Signed)
?Heart and Vascular Care Navigation ? ?05/06/2021 ? ?Ruben Murray ?1945-12-22 ?[id number] ? ?Reason for Referral: financial concerns, trouble managing at home ?  ?Engaged with patient face to face for initial visit for Heart and Vascular Care Coordination. ?                                                                                                  ?Assessment:    CSW consulted to speak with pt regarding above.  CSW inquired about financial concerns and pt reported only concern was with paying for Entresto if started back on it.  CSW explained that there is assistance available through manufacturer program but after speaking to provider we will not start pt on that at this time. ? ?Pt then expressed some stressors with home environment.  Wife in SNF at this time and he is just worried how they will manage house work with neither of them being able to do as much as they used to.  He has looked into aid services before and talking with friends/support about this- encouraged him to get help finding cleaning services as house cleaning is his biggest concern.                                 ? ?HRT/VAS Care Coordination   ? ? Living arrangements for the past 2 months Apartment  ? Lives with: Spouse  ? Home Assistive Devices/Equipment Blood pressure cuff; CPAP; Grab bars in shower; Grab bars around toilet; Scales; Shower chair with back; Eyeglasses  ? DME Agency [employer]  ? [place]  ? ?  ? ? ?Social History:                                                                             ?SDOH Screenings  ? ?Alcohol Screen: Not on file  ?Depression (PHQ2-9): Low Risk   ? PHQ-2 Score: 0  ?Financial Resource Strain: Not on file  ?Food Insecurity: Not on file  ?Housing: Low Risk   ? Last Housing Risk Score: 0  ?Physical Activity: Not on file  ?Social Connections: Not on file  ?Stress: Not on file  ?Tobacco Use: Medium Risk  ? Smoking Tobacco Use: Former  ? Smokeless Tobacco Use: Never  ? Passive  Exposure: Not on file  ?Transportation Needs: No Transportation Needs  ? Lack of Transportation (Medical): No  ? Lack of Transportation (Non-Medical): No  ? ? ?SDOH Interventions: ?Financial Resources:    ?Referred to [employer] for assistance with medication costs if needed  ?Food Insecurity:   None reported  ?Housing Insecurity:   None reported  ?Transportation:    None reported  ? ? ? ?Follow-up plan:   ? ?Pt to speak with  supports about getting private cleaning service in the house will call CSW if needs further support. ? ?Jorge Ny, LCSW ?Clinical Social Worker ?Advanced Heart Failure Clinic ?Desk#: [phone] ?Cell#: [phone] ? ? ? ?

## 2021-05-06 NOTE — Addendum Note (Signed)
Encounter addended by: Jorge Ny, LCSW on: 05/06/2021 1:23 PM ? Actions taken: Clinical Note Signed

## 2021-05-07 DIAGNOSIS — J209 Acute bronchitis, unspecified: Secondary | ICD-10-CM | POA: Diagnosis not present

## 2021-05-07 DIAGNOSIS — I4891 Unspecified atrial fibrillation: Secondary | ICD-10-CM | POA: Diagnosis not present

## 2021-05-09 ENCOUNTER — Other Ambulatory Visit: Payer: Self-pay

## 2021-05-09 DIAGNOSIS — Z7901 Long term (current) use of anticoagulants: Secondary | ICD-10-CM | POA: Diagnosis not present

## 2021-05-09 NOTE — Patient Instructions (Addendum)
Patient Goals/Self-Care Activities: Heart Failure ?Take all medications as prescribed ?Attend all scheduled provider appointments ?call office if I gain more than 2 pounds in one day or 5 pounds in one week ?use salt in moderation ?watch for swelling in feet, ankles and legs every day ?weigh myself daily ?follow rescue plan if symptoms flare-up ?eat more whole grains, fruits and vegetables, lean meats and healthy fats ?

## 2021-05-09 NOTE — Patient Outreach (Signed)
Ruben Murray) Care Management ? ?05/09/2021 ? ?Ruben Murray ?1945/06/08 ?[id number] ? ? ?Telephone call to patient for follow up.  He reports he is having some shortness of breath with activity.  Patient reports he was recently diagnosed with bronchitis.  He states COVID-19 test was negative.  Patient weight remains the same at 230 lbs. Patient tentatively scheduled for a ICD in April.  Heart failure management discussed.  No concerns.   ? ?Care Plan : RN Care Manager Plan of Care  ?Updates made by Jon Billings, RN since 05/09/2021 12:00 AM  ?  ? ?Problem: Chronic Disease Management and Care Coordination needs of HTN   ?Priority: High  ?  ? ?Long-Range Goal: Development of Plan of care for management of Heart Failure   ?Start Date: 04/26/2021  ?Expected End Date: 03/05/2022  ?This Visit's Progress: On track  ?Priority: High  ?Note:   ?Current Barriers:  ?Chronic Disease Management support and education needs related to CHF  ? ?RNCM Clinical Goal(s):  ?Patient will verbalize understanding of plan for management of CHF as evidenced by patient reporting of daily weights.  ?take all medications exactly as prescribed and will call provider for medication related questions as evidenced by Patient reports of medication adherence ?attend all scheduled medical appointments: Heart Failure Clinic 05-10-21 as evidenced by Patient attending appointment  through collaboration with RN Care manager, provider, and care team.  ? ?Interventions: ?Patient education and support related to heart failure ?Inter-disciplinary care team collaboration (see longitudinal plan of care) ?Evaluation of current treatment plan related to  self management and patient's adherence to plan as established by provider ? ? ?Heart Failure Interventions:  (Status:  Goal on track:  Yes.) Long Term Goal ?Provided education on low sodium diet ?Advised patient to weigh each morning after emptying bladder ?Discussed importance of daily weight and  advised patient to weigh and record daily ?Discussed the importance of keeping all appointments with provider ?Provided patient with education about the role of exercise in the management of heart failure ? ?05/09/21 Patient weight continues at 230 lbs.  Patient to see heart doctor on tomorrow. Patient pending ICD placement in April.  Discussed heart failure management.  ? ?Patient Goals/Self-Care Activities: Heart Failure ?Take all medications as prescribed ?Attend all scheduled provider appointments ?call office if I gain more than 2 pounds in one day or 5 pounds in one week ?use salt in moderation ?watch for swelling in feet, ankles and legs every day ?weigh myself daily ?follow rescue plan if symptoms flare-up ?eat more whole grains, fruits and vegetables, lean meats and healthy fats ? ?Follow Up Plan:  Telephone follow up appointment with care management team member scheduled for:  in the next 2 weeks ? ?  ? ?Plan: Follow-up: Patient agrees to Care Plan and Follow-up. ?Follow-up within 2 weeks. ? ?[name], RN, MSN ?[facility] Care Management ?Care Management Coordinator ?Direct Line [phone] ?Toll Free: [phone]  ?Fax: [phone] ? ? ?

## 2021-05-10 ENCOUNTER — Encounter: Payer: Self-pay | Admitting: Cardiology

## 2021-05-10 ENCOUNTER — Ambulatory Visit (INDEPENDENT_AMBULATORY_CARE_PROVIDER_SITE_OTHER): Payer: Medicare PPO | Admitting: Cardiology

## 2021-05-10 ENCOUNTER — Other Ambulatory Visit: Payer: Self-pay

## 2021-05-10 DIAGNOSIS — D6869 Other thrombophilia: Secondary | ICD-10-CM | POA: Insufficient documentation

## 2021-05-10 DIAGNOSIS — I5042 Chronic combined systolic (congestive) and diastolic (congestive) heart failure: Secondary | ICD-10-CM

## 2021-05-10 DIAGNOSIS — I4821 Permanent atrial fibrillation: Secondary | ICD-10-CM | POA: Diagnosis not present

## 2021-05-10 DIAGNOSIS — D6859 Other primary thrombophilia: Secondary | ICD-10-CM

## 2021-05-10 DIAGNOSIS — G4733 Obstructive sleep apnea (adult) (pediatric): Secondary | ICD-10-CM

## 2021-05-10 NOTE — Progress Notes (Signed)
Cardiology Office Note:    Date:  05/10/2021   ID:  [name], DOB March 08, 1945, MRN [id number]  PCP:  [name], MD   [facility] Providers Cardiologist:  [name], MD Electrophysiologist:  [name], MD     Referring MD: [name]*    History of Present Illness:    Ruben Murray is a 76 y.o. male with chronic systolic heart failure EF 20 to 25% awaiting biventricular ICD by Dr. Curt Bears.  Previously hospitalized earlier this year.  Fairly short of breath at times.  Echocardiogram reviewed.  On Toprol digoxin.  Has also had evidence of atrial fibrillation with rapid rates.  May need AV nodal ablation potentially in the future.  NYHA 3.   He says his leg is feeling better.  Past Medical History:  Diagnosis Date   Acquired thrombophilia ([place]) 01/15/2019   Aortic atherosclerosis ([place])    Aortic root dilatation (HCC)    CAP (community acquired pneumonia) 03/06/2016   Chronic laryngitis 66/59/9357   Chronic systolic CHF (congestive heart failure) ([place])    COPD GOLD II  08/02/2016   Quit smoking 2000  PFT's  07/10/2016  FEV1 1.98 (70 % ) ratio 67  p 19 % improvement from saba p nothing  prior to study while of coreg so rec as of 08/02/2016 try off coreg and on bisoprolol      Essential hypertension 03/06/2016   Changed from coreg to bisoprolol due to copd with reversible component  08/02/2016 >>>    Hoarseness 08/31/2016   Referred to ent 08/31/2016 >>> seen 10/03/16 [name] dx gerd and rhinitis medicamentosa   > improved on f/u 01/31/17 on gerd rx/ flonase and off afrin   Hyperlipidemia    Hypertension    Laryngopharyngeal reflux (LPR) 10/03/2016   Mitral regurgitation    Moderate persistent asthma 08/02/2016   Morbid (severe) obesity due to excess calories ([place]) 08/02/2016   Nasal polyps 10/03/2016   NICM (nonischemic cardiomyopathy) ([place])    a. EF 40-45% in 04/2016, etiology not defined, managed medically.   OSA on CPAP    Permanent atrial  fibrillation (HCC)    Prolonged QT interval 10/29/2017   RBBB    Recurrent erosion of cornea 06/23/2011   Rhinitis medicamentosa 10/03/2016   Right thyroid nodule 03/06/2016   Seizures ([place])    "take RX daily" (03/06/2016)   Sensorineural hearing loss (SNHL) of both ears 01/31/2017   Tricuspid regurgitation     Past Surgical History:  Procedure Laterality Date   CARDIOVERSION N/A 12/13/2017   Procedure: CARDIOVERSION;  Surgeon: [name], MD;  Location: [place];  Service: Cardiovascular;  Laterality: N/A;   CARDIOVERSION N/A 09/25/2018   Procedure: CARDIOVERSION;  Surgeon: [name], MD;  Location: [facility] ENDOSCOPY;  Service: Cardiovascular;  Laterality: N/A;   CARDIOVERSION N/A 11/04/2018   Procedure: CARDIOVERSION;  Surgeon: [name], MD;  Location: [facility] ENDOSCOPY;  Service: Cardiovascular;  Laterality: N/A;   CARDIOVERSION N/A 04/11/2019   Procedure: CARDIOVERSION;  Surgeon: [name], MD;  Location: [facility] ENDOSCOPY;  Service: Cardiovascular;  Laterality: N/A;   CIRCUMCISION     JOINT REPLACEMENT     PERCUTANEOUS PINNING TOE FRACTURE Left    "big toe   REPLACEMENT TOTAL KNEE Left    RIGHT/LEFT HEART CATH AND CORONARY ANGIOGRAPHY N/A 11/06/2019   Procedure: RIGHT/LEFT HEART CATH AND CORONARY ANGIOGRAPHY;  Surgeon: [name], MD;  Location: [place] CV LAB;  Service: Cardiovascular;  Laterality: N/A;  TONSILLECTOMY AND ADENOIDECTOMY      Current Medications: Current Meds  Medication Sig   acetaminophen (TYLENOL) 500 MG tablet Take 1,000 mg by mouth at bedtime as needed for mild pain or headache.   Carboxymethylcellulose Sod PF 0.5 % SOLN Place 1 drop into both eyes 4 (four) times daily as needed (dry eyes).   Cholecalciferol (VITAMIN D3) 25 MCG (1000 UT) CAPS Take 1,000 Units by mouth daily.   digoxin (LANOXIN) 0.125 MG tablet Take 1 tablet (0.125 mg total) by mouth daily.   empagliflozin (JARDIANCE) 10 MG TABS tablet Take 1 tablet (10 mg total) by  mouth daily.   escitalopram (LEXAPRO) 20 MG tablet Take 20 mg by mouth daily.   finasteride (PROSCAR) 5 MG tablet Take 5 mg by mouth daily at 12 noon.    fluticasone (FLONASE) 50 MCG/ACT nasal spray Place 1 spray into both nostrils 2 (two) times daily.   loratadine (CLARITIN) 10 MG tablet Take 10 mg by mouth daily.   losartan (COZAAR) 25 MG tablet Take 1 tablet (25 mg total) by mouth daily.   metoprolol succinate (TOPROL-XL) 100 MG 24 hr tablet Take 1 tablet (100 mg total) by mouth daily. Take with or immediately following a meal.   Multiple Vitamin (MULTIVITAMIN WITH MINERALS) TABS tablet Take 1 tablet by mouth daily at 12 noon.   PHENobarbital (LUMINAL) 97.2 MG tablet Take 97.2 mg by mouth daily.   pravastatin (PRAVACHOL) 40 MG tablet Take 40 mg by mouth at bedtime.    spironolactone (ALDACTONE) 25 MG tablet Take 1 tablet (25 mg total) by mouth daily.   torsemide (DEMADEX) 20 MG tablet Take 2 tablets (40 mg total) by mouth daily.   warfarin (COUMADIN) 5 MG tablet Take 5 mg by mouth daily.     Allergies:   Dilaudid [hydromorphone hcl], Hydromorphone, Morphine sulfate, Tegretol [carbamazepine], and Carbamazepine   Social History   Socioeconomic History   Marital status: Married    Spouse name: Not on file   Number of children: Not on file   Years of education: Not on file   Highest education level: Not on file  Occupational History   Not on file  Tobacco Use   Smoking status: Former    Packs/day: 3.00    Years: 48.00    Pack years: 144.00    Types: Cigarettes    Quit date: 2000    Years since quitting: 23.1   Smokeless tobacco: Never  Vaping Use   Vaping Use: Never used  Substance and Sexual Activity   Alcohol use: Never   Drug use: Never   Sexual activity: Never  Other Topics Concern   Not on file  Social History Narrative   ** Merged History Encounter **       Social Determinants of Health   Financial Resource Strain: Not on file  Food Insecurity: Not on file   Transportation Needs: No Transportation Needs   Lack of Transportation (Medical): No   Lack of Transportation (Non-Medical): No  Physical Activity: Not on file  Stress: Not on file  Social Connections: Not on file     Family History: The patient's family history includes Hypertension in his mother; Other in his father.  ROS:   Please see the history of present illness.     All other systems reviewed and are negative.  EKGs/Labs/Other Studies Reviewed:    Recent Labs: 04/19/2021: ALT 25; TSH 1.898 04/20/2021: Hemoglobin 12.3; Platelets 152 04/21/2021: Magnesium 1.8 05/04/2021: B Natriuretic Peptide 902.3; BUN 19;  Creatinine, Ser 1.03; Potassium 4.7; Sodium 142  Recent Lipid Panel    Component Value Date/Time   CHOL 122 04/20/2021 0210   TRIG 60 04/20/2021 0210   HDL 37 (L) 04/20/2021 0210   CHOLHDL 3.3 04/20/2021 0210   VLDL 12 04/20/2021 0210   LDLCALC 73 04/20/2021 0210     Risk Assessment/Calculations:              Physical Exam:    VS:  BP 104/70    Pulse 96    Ht _0  (1.727 m)    Wt 243 lb (110.2 kg)    SpO2 98%    BMI 36.95 kg/m     Wt Readings from Last 3 Encounters:  05/10/21 243 lb (110.2 kg)  05/04/21 238 lb 6.4 oz (108.1 kg)  05/03/21 238 lb 6.4 oz (108.1 kg)     GEN:  Well nourished, well developed in no acute distress HEENT: Normal NECK: No JVD; No carotid bruits LYMPHATICS: No lymphadenopathy CARDIAC: Irregularly irregular tachycardic, no murmurs, no rubs, gallops RESPIRATORY:  Clear to auscultation without rales, wheezing or rhonchi  ABDOMEN: Soft, non-tender, non-distended MUSCULOSKELETAL:  No edema; No deformity  SKIN: Warm and dry NEUROLOGIC:  Alert and oriented x 3 PSYCHIATRIC:  Normal affect   ASSESSMENT:    1. Chronic combined systolic and diastolic heart failure ([place])   2. Permanent atrial fibrillation ([place])   3. OSA (obstructive sleep apnea)   4. Hypercoagulable state ([place])    PLAN:    In order of problems listed  above:  Chronic combined systolic and diastolic heart failure (HCC) EF 20 to 25%, currently on goal-directed medical therapy which includes metoprolol 100 mg a day spironolactone 25 mg a day digoxin 0.125 mg a day Jardiance 10 mg a day torsemide 40 mg a day and losartan 25 mg a day.  Could not tolerate Entresto secondary to hypotension.  NYHA class III.  He is awaiting CRT D therapy early April by Dr. Curt Bears.  Excellent.  Permanent atrial fibrillation (HCC) Difficult at times to control heart rate.  He is off of amiodarone.  Has failed replete cardioversion.  There has been some talk about AV nodal ablation if absolutely necessary.  He has had over 4 cardioversions.  On warfarin for anticoagulation.  OSA (obstructive sleep apnea) On CPAP.  Hypercoagulable state ([place]) Secondary to anticoagulation, warfarin.  INR last checked 1.5 subtherapeutic.  Creatinine 1.03 hemoglobin 12.3 hemoglobin A1c 5.8 LDL 73        Medication Adjustments/Labs and Tests Ordered: Current medicines are reviewed at length with the patient today.  Concerns regarding medicines are outlined above.  No orders of the defined types were placed in this encounter.  No orders of the defined types were placed in this encounter.   Patient Instructions  Medication Instructions:  The current medical regimen is effective;  continue present plan and medications.  *If you need a refill on your cardiac medications before your next appointment, please call your pharmacy*  Follow-Up: At [facility], you and your health needs are our priority.  As part of our continuing mission to provide you with exceptional heart care, we have created designated Provider Care Teams.  These Care Teams include your primary Cardiologist (physician) and Advanced Practice Providers (APPs -  Physician Assistants and Nurse Practitioners) who all work together to provide you with the care you need, when you need it.  We recommend signing up for  the patient portal called "MyChart".  Sign up information  is provided on this After Visit Summary.  MyChart is used to connect with patients for Virtual Visits (Telemedicine).  Patients are able to view lab/test results, encounter notes, upcoming appointments, etc.  Non-urgent messages can be sent to your provider as well.   To learn more about what you can do with MyChart, go to [email or web address].    Your next appointment:   6 month(s)  The format for your next appointment:   In Person  Provider:   [name], MD    Thank you for choosing [facility]!!      Signed, [name], MD  05/10/2021 11:26 AM    [place]

## 2021-05-10 NOTE — Assessment & Plan Note (Signed)
On CPAP. ?

## 2021-05-10 NOTE — Assessment & Plan Note (Signed)
Secondary to anticoagulation, warfarin.  INR last checked 1.5 subtherapeutic.  Creatinine 1.03 hemoglobin 12.3 hemoglobin A1c 5.8 LDL 73 ?

## 2021-05-10 NOTE — Patient Instructions (Signed)
Medication Instructions:  ?The current medical regimen is effective;  continue present plan and medications. ? ?*If you need a refill on your cardiac medications before your next appointment, please call your pharmacy* ? ?Follow-Up: ?At [facility], you and your health needs are our priority.  As part of our continuing mission to provide you with exceptional heart care, we have created designated Provider Care Teams.  These Care Teams include your primary Cardiologist (physician) and Advanced Practice Providers (APPs -  Physician Assistants and Nurse Practitioners) who all work together to provide you with the care you need, when you need it. ? ?We recommend signing up for the patient portal called "MyChart".  Sign up information is provided on this After Visit Summary.  MyChart is used to connect with patients for Virtual Visits (Telemedicine).  Patients are able to view lab/test results, encounter notes, upcoming appointments, etc.  Non-urgent messages can be sent to your provider as well.   ?To learn more about what you can do with MyChart, go to [email or web address].   ? ?Your next appointment:   ?6 month(s) ? ?The format for your next appointment:   ?In Person ? ?Provider:   ?[name], MD   ? ?Thank you for choosing [facility]!! ? ? ? ?

## 2021-05-10 NOTE — Assessment & Plan Note (Signed)
EF 20 to 25%, currently on goal-directed medical therapy which includes metoprolol 100 mg a day spironolactone 25 mg a day digoxin 0.125 mg a day Jardiance 10 mg a day torsemide 40 mg a day and losartan 25 mg a day.  Could not tolerate Entresto secondary to hypotension.  NYHA class III.  He is awaiting CRT D therapy early April by Dr. Curt Bears.  Excellent. ?

## 2021-05-10 NOTE — Assessment & Plan Note (Signed)
Difficult at times to control heart rate.  He is off of amiodarone.  Has failed replete cardioversion.  There has been some talk about AV nodal ablation if absolutely necessary.  He has had over 4 cardioversions.  On warfarin for anticoagulation. ?

## 2021-05-13 DIAGNOSIS — R233 Spontaneous ecchymoses: Secondary | ICD-10-CM | POA: Diagnosis not present

## 2021-05-13 DIAGNOSIS — L309 Dermatitis, unspecified: Secondary | ICD-10-CM | POA: Diagnosis not present

## 2021-05-13 DIAGNOSIS — I4811 Longstanding persistent atrial fibrillation: Secondary | ICD-10-CM | POA: Diagnosis not present

## 2021-05-13 DIAGNOSIS — I5042 Chronic combined systolic (congestive) and diastolic (congestive) heart failure: Secondary | ICD-10-CM | POA: Diagnosis not present

## 2021-05-16 NOTE — Progress Notes (Incomplete)
HEART IMPACT TRANSITIONS OF CARE    PCP: Primary Cardiologist:  HPI:  Ruben Murray is a 76 y/o male w/ chronic systolic heart failure, secondary to nonischemic CM and dating back since 2018. Suspected tachymediated CM from long history of poorly controlled, Afib/Flutter, failing multiple cardioversions and AAD therapies. On chronic coumadin for a/c.  Also h/o obesity, COPD, OSA on CPAP and hypertension.     Recent admit 2/23 for a/c CHF in the setting of rapid afib. Echo showed EF 20-25%, RV mildly reduced, RVSP 48 mmHg, mild Ruben, mild TR.    He was diuresed w/ IV Lasix. Afib treated w/ rate control, w/ metoprolol and digoxin. Amiodarone discontinued given chronic afib. V-rate improved in the 90s.  After diuresis, he was transition to PO torsemide. GDMT added. Instructed to f/u w/ EP post hospital and referred to [facility] clinic. D/w Wt charted at 227 lb.    Saw  Dr. Curt Bears and plans for CRT-D implant w/ plans to consider AV node ablation if further issues w/ rapid afib.    Overall feeling fine. Denies SOB/PND/Orthopnea. Appetite ok. No fever or chills. Weight at home 170 pounds. Taking all medications.    Of note, he had previously been on Entresto in the past but unable to continue due to cost.      Cardiac Testing  Echo 04/2016 EF 40-45%, RV normal, mod TR Echo 10/2017 EF 25%, RV moderately reduced (in Afib) Echo 03/2018 EF 40-45%, RV mildly dilated, systolic fx normal  Echo 05/4740 EF 20-25%, RV moderately reduced, RVSP 38, Mild Ruben, mild TR LHC 11/2019 Normal coronaries, RHC mild pulmonary hypertension; PA 34/22, mean PA pressure 28  Echo 04/2021 EF 20-25%, RV mildly reduced, RVSP 48, mild Ruben, mild TR    ROS: All systems negative except as listed in HPI, PMH and Problem List.  SH:  Social History   Socioeconomic History   Marital status: Married    Spouse name: Not on file   Number of children: Not on file   Years of education: Not on file   Highest education level: Not on file   Occupational History   Not on file  Tobacco Use   Smoking status: Former    Packs/day: 3.00    Years: 48.00    Pack years: 144.00    Types: Cigarettes    Quit date: 2000    Years since quitting: 23.2   Smokeless tobacco: Never  Vaping Use   Vaping Use: Never used  Substance and Sexual Activity   Alcohol use: Never   Drug use: Never   Sexual activity: Never  Other Topics Concern   Not on file  Social History Narrative   ** Merged History Encounter **       Social Determinants of Health   Financial Resource Strain: Not on file  Food Insecurity: Not on file  Transportation Needs: No Transportation Needs   Lack of Transportation (Medical): No   Lack of Transportation (Non-Medical): No  Physical Activity: Not on file  Stress: Not on file  Social Connections: Not on file  Intimate Partner Violence: Not on file    FH:  Family History  Problem Relation Age of Onset   Hypertension Mother    Other Father        sudden cardiac arrest    Past Medical History:  Diagnosis Date   Acquired thrombophilia ([place]) 01/15/2019   Aortic atherosclerosis ([place])    Aortic root dilatation (HCC)    CAP (community acquired  pneumonia) 03/06/2016   Chronic laryngitis 38/25/0539   Chronic systolic CHF (congestive heart failure) ([place])    COPD GOLD II  08/02/2016   Quit smoking 2000  PFT's  07/10/2016  FEV1 1.98 (70 % ) ratio 67  p 19 % improvement from saba p nothing  prior to study while of coreg so rec as of 08/02/2016 try off coreg and on bisoprolol      Essential hypertension 03/06/2016   Changed from coreg to bisoprolol due to copd with reversible component  08/02/2016 >>>    Hoarseness 08/31/2016   Referred to ent 08/31/2016 >>> seen 10/03/16 [name] dx gerd and rhinitis medicamentosa   > improved on f/u 01/31/17 on gerd rx/ flonase and off afrin   Hyperlipidemia    Hypertension    Laryngopharyngeal reflux (LPR) 10/03/2016   Mitral regurgitation    Moderate persistent asthma 08/02/2016    Morbid (severe) obesity due to excess calories ([place]) 08/02/2016   Nasal polyps 10/03/2016   NICM (nonischemic cardiomyopathy) ([place])    a. EF 40-45% in 04/2016, etiology not defined, managed medically.   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   Rhinitis medicamentosa 10/03/2016   Right thyroid nodule 03/06/2016   Seizures ([place])    "take RX daily" (03/06/2016)   Sensorineural hearing loss (SNHL) of both ears 01/31/2017   Tricuspid regurgitation     Current Outpatient Medications  Medication Sig Dispense Refill   acetaminophen (TYLENOL) 500 MG tablet Take 1,000 mg by mouth at bedtime as needed for mild pain or headache.     Carboxymethylcellulose Sod PF 0.5 % SOLN Place 1 drop into both eyes 4 (four) times daily as needed (dry eyes).     Cholecalciferol (VITAMIN D3) 25 MCG (1000 UT) CAPS Take 1,000 Units by mouth daily.     digoxin (LANOXIN) 0.125 MG tablet Take 1 tablet (0.125 mg total) by mouth daily. 3 tablet 5   empagliflozin (JARDIANCE) 10 MG TABS tablet Take 1 tablet (10 mg total) by mouth daily. 30 tablet 5   escitalopram (LEXAPRO) 20 MG tablet Take 20 mg by mouth daily.     finasteride (PROSCAR) 5 MG tablet Take 5 mg by mouth daily at 12 noon.      fluticasone (FLONASE) 50 MCG/ACT nasal spray Place 1 spray into both nostrils 2 (two) times daily.     loratadine (CLARITIN) 10 MG tablet Take 10 mg by mouth daily.     losartan (COZAAR) 25 MG tablet Take 1 tablet (25 mg total) by mouth daily. 30 tablet 5   metoprolol succinate (TOPROL-XL) 100 MG 24 hr tablet Take 1 tablet (100 mg total) by mouth daily. Take with or immediately following a meal. 90 tablet 0   Multiple Vitamin (MULTIVITAMIN WITH MINERALS) TABS tablet Take 1 tablet by mouth daily at 12 noon.     PHENobarbital (LUMINAL) 97.2 MG tablet Take 97.2 mg by mouth daily.     pravastatin (PRAVACHOL) 40 MG tablet Take 40 mg by mouth at  bedtime.      spironolactone (ALDACTONE) 25 MG tablet Take 1 tablet (25 mg total) by mouth daily. 30 tablet 5   torsemide (DEMADEX) 20 MG tablet Take 2 tablets (40 mg total) by mouth daily. 60 tablet 5   warfarin (COUMADIN) 5 MG tablet Take 5 mg by mouth daily.     No current facility-administered medications for this  visit.    There were no vitals filed for this visit.  PHYSICAL EXAM:  General:  Well appearing. No resp difficulty HEENT: normal Neck: supple. JVP flat. Carotids 2+ bilaterally; no bruits. No lymphadenopathy or thryomegaly appreciated. Cor: PMI normal. Regular rate & rhythm. No rubs, gallops or murmurs. Lungs: clear Abdomen: soft, nontender, nondistended. No hepatosplenomegaly. No bruits or masses. Good bowel sounds. Extremities: no cyanosis, clubbing, rash, edema Neuro: alert & orientedx3, cranial nerves grossly intact. Moves all 4 extremities w/o difficulty. Affect pleasant.  ASSESSMENT & PLAN: 1. Chronic Systolic Heart Failure - NICM. Dates back to at least 2018, EFs have fluctuated from 20-40% - most recent echo 2/22 EF 20-25%, RV moderately reduced - LHC in 2021 showed normal coronaries - suspect tachy mediated CM from long history of chronic, poorly rate controlled Afib/flutter  - Planning CRT-D in April  w/ Dr. Curt Bears. QRS 151 ms  - Plan AV nodal ablation, as he has not tolerated rapid Afib well, in setting of severe LV dysfunction  - NYHA Class II - Continue torsemide to 40 mg daily - Continue Jardiance 10 mg daily  - Continue Spiro to 25 mg daily  - Continue Losartan 25 mg daily. Not on Entresto due to cost - Continue Toprol XL 100 mg daily  - Continue Digoxin 0.125 mg daily. Dig level <0.2       2. Permanent Atrial Fibrillation  - failed multiple cardioversions and AAD therapy  - issues w/ Rapid Afib in the past, complicating HF - currently under decent control w/ metoprolol and digoxin  - followed by Dr. Curt Bears w/ plans to implant CRT-D +/- AV  nodal ablation next month - on chronic coumadin. DOACs too expensive.  INRs followed by coumadin clinic    3. Pulmonary Hypertension  - suspect combination WHO Group 2 and 3 - PFTs in 2018 showed moderate OAD. Also w/ OSA  - continue tx w/ CPAP     4. OSA  - stressed importance of CPAP    5. Essential HTN - BP controlled on current regimen -      Follow up

## 2021-05-17 ENCOUNTER — Emergency Department (HOSPITAL_COMMUNITY): Payer: Medicare PPO

## 2021-05-17 ENCOUNTER — Telehealth (HOSPITAL_COMMUNITY): Payer: Self-pay | Admitting: *Deleted

## 2021-05-17 ENCOUNTER — Inpatient Hospital Stay (HOSPITAL_COMMUNITY)
Admission: EM | Admit: 2021-05-17 | Discharge: 2021-05-24 | DRG: 291 | Disposition: A | Discharge status: Home Health | Payer: Medicare PPO | Attending: Cardiology | Admitting: Cardiology

## 2021-05-17 ENCOUNTER — Ambulatory Visit (HOSPITAL_COMMUNITY): Payer: Medicare PPO

## 2021-05-17 ENCOUNTER — Ambulatory Visit: Payer: Self-pay

## 2021-05-17 ENCOUNTER — Other Ambulatory Visit: Payer: Self-pay

## 2021-05-17 DIAGNOSIS — I2722 Pulmonary hypertension due to left heart disease: Secondary | ICD-10-CM | POA: Diagnosis present

## 2021-05-17 DIAGNOSIS — I5043 Acute on chronic combined systolic (congestive) and diastolic (congestive) heart failure: Secondary | ICD-10-CM | POA: Diagnosis present

## 2021-05-17 DIAGNOSIS — E875 Hyperkalemia: Secondary | ICD-10-CM | POA: Diagnosis present

## 2021-05-17 DIAGNOSIS — J90 Pleural effusion, not elsewhere classified: Secondary | ICD-10-CM | POA: Diagnosis not present

## 2021-05-17 DIAGNOSIS — J449 Chronic obstructive pulmonary disease, unspecified: Secondary | ICD-10-CM | POA: Diagnosis present

## 2021-05-17 DIAGNOSIS — I5023 Acute on chronic systolic (congestive) heart failure: Secondary | ICD-10-CM | POA: Diagnosis not present

## 2021-05-17 DIAGNOSIS — Z96652 Presence of left artificial knee joint: Secondary | ICD-10-CM | POA: Diagnosis present

## 2021-05-17 DIAGNOSIS — N182 Chronic kidney disease, stage 2 (mild): Secondary | ICD-10-CM | POA: Diagnosis present

## 2021-05-17 DIAGNOSIS — I340 Nonrheumatic mitral (valve) insufficiency: Secondary | ICD-10-CM | POA: Diagnosis present

## 2021-05-17 DIAGNOSIS — I2723 Pulmonary hypertension due to lung diseases and hypoxia: Secondary | ICD-10-CM | POA: Diagnosis present

## 2021-05-17 DIAGNOSIS — I420 Dilated cardiomyopathy: Secondary | ICD-10-CM | POA: Diagnosis present

## 2021-05-17 DIAGNOSIS — E669 Obesity, unspecified: Secondary | ICD-10-CM | POA: Diagnosis present

## 2021-05-17 DIAGNOSIS — M79672 Pain in left foot: Secondary | ICD-10-CM | POA: Diagnosis not present

## 2021-05-17 DIAGNOSIS — R0602 Shortness of breath: Secondary | ICD-10-CM | POA: Diagnosis not present

## 2021-05-17 DIAGNOSIS — R0902 Hypoxemia: Secondary | ICD-10-CM | POA: Diagnosis not present

## 2021-05-17 DIAGNOSIS — E1122 Type 2 diabetes mellitus with diabetic chronic kidney disease: Secondary | ICD-10-CM | POA: Diagnosis present

## 2021-05-17 DIAGNOSIS — R945 Abnormal results of liver function studies: Secondary | ICD-10-CM

## 2021-05-17 DIAGNOSIS — I4891 Unspecified atrial fibrillation: Secondary | ICD-10-CM | POA: Diagnosis not present

## 2021-05-17 DIAGNOSIS — G4733 Obstructive sleep apnea (adult) (pediatric): Secondary | ICD-10-CM | POA: Diagnosis present

## 2021-05-17 DIAGNOSIS — E782 Mixed hyperlipidemia: Secondary | ICD-10-CM | POA: Diagnosis present

## 2021-05-17 DIAGNOSIS — I130 Hypertensive heart and chronic kidney disease with heart failure and stage 1 through stage 4 chronic kidney disease, or unspecified chronic kidney disease: Principal | ICD-10-CM | POA: Diagnosis present

## 2021-05-17 DIAGNOSIS — R000 Tachycardia, unspecified: Secondary | ICD-10-CM | POA: Diagnosis not present

## 2021-05-17 DIAGNOSIS — D72829 Elevated white blood cell count, unspecified: Secondary | ICD-10-CM | POA: Diagnosis present

## 2021-05-17 DIAGNOSIS — I700 Atherosclerosis of aorta: Secondary | ICD-10-CM | POA: Diagnosis present

## 2021-05-17 DIAGNOSIS — I4821 Permanent atrial fibrillation: Secondary | ICD-10-CM | POA: Diagnosis not present

## 2021-05-17 DIAGNOSIS — Z20822 Contact with and (suspected) exposure to covid-19: Secondary | ICD-10-CM | POA: Diagnosis present

## 2021-05-17 DIAGNOSIS — I4510 Unspecified right bundle-branch block: Secondary | ICD-10-CM | POA: Diagnosis present

## 2021-05-17 DIAGNOSIS — K7689 Other specified diseases of liver: Secondary | ICD-10-CM

## 2021-05-17 DIAGNOSIS — Z9114 Patient's other noncompliance with medication regimen: Secondary | ICD-10-CM

## 2021-05-17 DIAGNOSIS — W57XXXA Bitten or stung by nonvenomous insect and other nonvenomous arthropods, initial encounter: Secondary | ICD-10-CM | POA: Diagnosis present

## 2021-05-17 DIAGNOSIS — K8020 Calculus of gallbladder without cholecystitis without obstruction: Secondary | ICD-10-CM | POA: Diagnosis not present

## 2021-05-17 DIAGNOSIS — H903 Sensorineural hearing loss, bilateral: Secondary | ICD-10-CM | POA: Diagnosis present

## 2021-05-17 DIAGNOSIS — B888 Other specified infestations: Secondary | ICD-10-CM | POA: Diagnosis present

## 2021-05-17 DIAGNOSIS — Z6835 Body mass index (BMI) 35.0-35.9, adult: Secondary | ICD-10-CM

## 2021-05-17 DIAGNOSIS — I110 Hypertensive heart disease with heart failure: Secondary | ICD-10-CM | POA: Diagnosis not present

## 2021-05-17 DIAGNOSIS — Z885 Allergy status to narcotic agent status: Secondary | ICD-10-CM

## 2021-05-17 DIAGNOSIS — Z79899 Other long term (current) drug therapy: Secondary | ICD-10-CM

## 2021-05-17 DIAGNOSIS — Z7901 Long term (current) use of anticoagulants: Secondary | ICD-10-CM

## 2021-05-17 DIAGNOSIS — I491 Atrial premature depolarization: Secondary | ICD-10-CM | POA: Diagnosis not present

## 2021-05-17 DIAGNOSIS — R7989 Other specified abnormal findings of blood chemistry: Secondary | ICD-10-CM | POA: Diagnosis present

## 2021-05-17 DIAGNOSIS — G40909 Epilepsy, unspecified, not intractable, without status epilepticus: Secondary | ICD-10-CM | POA: Diagnosis present

## 2021-05-17 DIAGNOSIS — Z888 Allergy status to other drugs, medicaments and biological substances status: Secondary | ICD-10-CM | POA: Diagnosis not present

## 2021-05-17 DIAGNOSIS — R188 Other ascites: Secondary | ICD-10-CM | POA: Diagnosis not present

## 2021-05-17 DIAGNOSIS — E876 Hypokalemia: Secondary | ICD-10-CM | POA: Diagnosis not present

## 2021-05-17 DIAGNOSIS — Z8249 Family history of ischemic heart disease and other diseases of the circulatory system: Secondary | ICD-10-CM

## 2021-05-17 DIAGNOSIS — E785 Hyperlipidemia, unspecified: Secondary | ICD-10-CM | POA: Diagnosis present

## 2021-05-17 DIAGNOSIS — Z87891 Personal history of nicotine dependence: Secondary | ICD-10-CM

## 2021-05-17 DIAGNOSIS — I10 Essential (primary) hypertension: Secondary | ICD-10-CM | POA: Diagnosis present

## 2021-05-17 DIAGNOSIS — I4811 Longstanding persistent atrial fibrillation: Secondary | ICD-10-CM

## 2021-05-17 DIAGNOSIS — I959 Hypotension, unspecified: Secondary | ICD-10-CM | POA: Diagnosis not present

## 2021-05-17 LAB — DIGOXIN LEVEL: Digoxin Level: <0.2 ng/mL — ABNORMAL LOW (ref 0.8–2.0)

## 2021-05-17 LAB — COMPREHENSIVE METABOLIC PANEL
ALT: 448 U/L — ABNORMAL HIGH (ref 0–44)
AST: 268 U/L — ABNORMAL HIGH (ref 15–41)
Albumin: 3.1 g/dL — ABNORMAL LOW (ref 3.5–5.0)
Alkaline Phosphatase: 112 U/L (ref 38–126)
Anion gap: 10 (ref 5–15)
BUN: 30 mg/dL — ABNORMAL HIGH (ref 8–23)
CO2: 21 mmol/L — ABNORMAL LOW (ref 22–32)
Calcium: 8.2 mg/dL — ABNORMAL LOW (ref 8.9–10.3)
Chloride: 105 mmol/L (ref 98–111)
Creatinine, Ser: 1.23 mg/dL (ref 0.61–1.24)
GFR, Estimated: >60 mL/min (ref >60)
Glucose, Bld: 104 mg/dL — ABNORMAL HIGH (ref 70–99)
Potassium: 5.7 mmol/L — ABNORMAL HIGH (ref 3.5–5.1)
Sodium: 136 mmol/L (ref 135–145)
Total Bilirubin: 1.6 mg/dL — ABNORMAL HIGH (ref 0.3–1.2)
Total Protein: 6 g/dL — ABNORMAL LOW (ref 6.5–8.1)

## 2021-05-17 LAB — URINALYSIS, ROUTINE W REFLEX MICROSCOPIC
Bilirubin Urine: NEGATIVE
Glucose, UA: NEGATIVE mg/dL
Hgb urine dipstick: NEGATIVE
Ketones, ur: NEGATIVE mg/dL
Leukocytes,Ua: NEGATIVE
Nitrite: NEGATIVE
Protein, ur: NEGATIVE mg/dL
Specific Gravity, Urine: 1.01 (ref 1.005–1.030)
pH: 5 (ref 5.0–8.0)

## 2021-05-17 LAB — CBC WITH DIFFERENTIAL/PLATELET
Abs Immature Granulocytes: 0.07 10*3/uL (ref 0.00–0.07)
Basophils Absolute: 0 10*3/uL (ref 0.0–0.1)
Basophils Relative: 0 %
Eosinophils Absolute: 0.2 10*3/uL (ref 0.0–0.5)
Eosinophils Relative: 1 %
HCT: 40.6 % (ref 39.0–52.0)
Hemoglobin: 12.7 g/dL — ABNORMAL LOW (ref 13.0–17.0)
Immature Granulocytes: 1 %
Lymphocytes Relative: 16 %
Lymphs Abs: 2 10*3/uL (ref 0.7–4.0)
MCH: 30.4 pg (ref 26.0–34.0)
MCHC: 31.3 g/dL (ref 30.0–36.0)
MCV: 97.1 fL (ref 80.0–100.0)
Monocytes Absolute: 1 10*3/uL (ref 0.1–1.0)
Monocytes Relative: 8 %
Neutro Abs: 9.3 10*3/uL — ABNORMAL HIGH (ref 1.7–7.7)
Neutrophils Relative %: 74 %
Platelets: 226 10*3/uL (ref 150–400)
RBC: 4.18 MIL/uL — ABNORMAL LOW (ref 4.22–5.81)
RDW: 15.8 % — ABNORMAL HIGH (ref 11.5–15.5)
WBC: 12.6 10*3/uL — ABNORMAL HIGH (ref 4.0–10.5)
nRBC: 0.2 % (ref 0.0–0.2)

## 2021-05-17 LAB — POTASSIUM
Potassium: 5.8 mmol/L — ABNORMAL HIGH (ref 3.5–5.1)
Potassium: 4.7 mmol/L (ref 3.5–5.1)

## 2021-05-17 LAB — HEPATITIS PANEL, ACUTE
HCV Ab: NONREACTIVE
Hep A IgM: NONREACTIVE
Hep B C IgM: NONREACTIVE
Hepatitis B Surface Ag: NONREACTIVE

## 2021-05-17 LAB — PHENOBARBITAL LEVEL: Phenobarbital: 11.6 ug/mL — ABNORMAL LOW (ref 15.0–30.0)

## 2021-05-17 LAB — RESP PANEL BY RT-PCR (FLU A&B, COVID) ARPGX2
Influenza A by PCR: NEGATIVE
Influenza B by PCR: NEGATIVE
SARS Coronavirus 2 by RT PCR: NEGATIVE

## 2021-05-17 LAB — BRAIN NATRIURETIC PEPTIDE: B Natriuretic Peptide: 767.1 pg/mL — ABNORMAL HIGH (ref 0.0–100.0)

## 2021-05-17 LAB — PROTIME-INR
INR: 4.4 (ref 0.8–1.2)
Prothrombin Time: 41.7 seconds — ABNORMAL HIGH (ref 11.4–15.2)

## 2021-05-17 MED ORDER — METOPROLOL TARTRATE 50 MG PO TABS
50.0000 mg | ORAL_TABLET | Freq: Two times a day (BID) | ORAL | Status: DC
  Administered 2021-05-17 – 2021-05-22 (×12): 50 mg via ORAL
  Filled 2021-05-17 (×8): qty 1
  Filled 2021-05-17: qty 2
  Filled 2021-05-17 (×4): qty 1

## 2021-05-17 MED ORDER — SODIUM CHLORIDE 0.9% FLUSH: 3.0000 mL | INTRAVENOUS | Status: DC | PRN

## 2021-05-17 MED ORDER — SODIUM CHLORIDE 0.9 % IV SOLN: 250.0000 mL | INTRAVENOUS | Status: DC | PRN

## 2021-05-17 MED ORDER — POLYVINYL ALCOHOL 1.4 % OP SOLN: 1.0000 [drp] | Freq: Four times a day (QID) | OPHTHALMIC | Status: DC | PRN

## 2021-05-17 MED ORDER — FUROSEMIDE 10 MG/ML IJ SOLN
80.0000 mg | Freq: Two times a day (BID) | INTRAMUSCULAR | Status: DC
  Administered 2021-05-17 – 2021-05-24 (×14): 80 mg via INTRAVENOUS
  Filled 2021-05-17 (×14): qty 8

## 2021-05-17 MED ORDER — PHENOBARBITAL 32.4 MG PO TABS
97.2000 mg | ORAL_TABLET | Freq: Every day | ORAL | Status: DC
Start: 2021-05-17 — End: 2021-05-24
  Administered 2021-05-18 – 2021-05-23 (×7): 97.2 mg via ORAL
  Filled 2021-05-17 (×7): qty 3

## 2021-05-17 MED ORDER — FLUTICASONE PROPIONATE 50 MCG/ACT NA SUSP
1.0000 | Freq: Two times a day (BID) | NASAL | Status: DC
  Administered 2021-05-18 – 2021-05-23 (×13): 1 via NASAL
  Filled 2021-05-17: qty 16

## 2021-05-17 MED ORDER — SODIUM CHLORIDE 0.9% FLUSH
3.0000 mL | Freq: Two times a day (BID) | INTRAVENOUS | Status: DC
Start: 2021-05-18 — End: 2021-05-24
  Administered 2021-05-17 – 2021-05-24 (×13): 3 mL via INTRAVENOUS

## 2021-05-17 MED ORDER — FINASTERIDE 5 MG PO TABS
5.0000 mg | ORAL_TABLET | Freq: Every day | ORAL | Status: DC
  Administered 2021-05-18 – 2021-05-24 (×7): 5 mg via ORAL
  Filled 2021-05-17 (×7): qty 1

## 2021-05-17 MED ORDER — LORATADINE 10 MG PO TABS
10.0000 mg | ORAL_TABLET | Freq: Every day | ORAL | Status: DC
  Administered 2021-05-18 – 2021-05-24 (×7): 10 mg via ORAL
  Filled 2021-05-17 (×7): qty 1

## 2021-05-17 MED ORDER — WARFARIN - PHARMACIST DOSING INPATIENT: Freq: Every day | Status: DC

## 2021-05-17 MED ORDER — DIGOXIN 125 MCG PO TABS
0.1250 mg | ORAL_TABLET | Freq: Every day | ORAL | Status: DC
  Administered 2021-05-18 – 2021-05-19 (×2): 0.125 mg via ORAL
  Filled 2021-05-17 (×3): qty 1

## 2021-05-17 MED ORDER — FUROSEMIDE 10 MG/ML IJ SOLN
80.0000 mg | Freq: Once | INTRAMUSCULAR | Status: AC
  Administered 2021-05-17: 80 mg via INTRAVENOUS
  Filled 2021-05-17: qty 8

## 2021-05-17 NOTE — Progress Notes (Signed)
Patient stated he doesn't wear CPAP anymore. Refused CPAP.  ?

## 2021-05-17 NOTE — ED Provider Notes (Signed)
?[facility] ?Provider Note ? ? ?CSN: [id number] ?Arrival date & time: 05/17/21  1133 ? ?  ? ?History ? ?Chief Complaint  ?Patient presents with  ? Palpitations  ? ? ?Ruben Murray is a 76 y.o. male. ? ?HPI ?Patient presents via EMS with concern for dyspnea, weakness, palpitations.  He acknowledges multiple medical issues including heart failure, A-fib, states that he is not taking his medication as directed.  He is scheduled for cardiac defibrillator placement next month.  Over the past few days he has become progressively weaker, prompting evaluation.  No focal pain, no syncope.  EMS reports the patient was tachycardic, as high as 160 in route, no clear evaluation but this seemed to improve prior to transport.  He was hypotensive initially, 80/60, this improved to 100/70 with fluid bolus. ?  ? ?Home Medications ?Prior to Admission medications   ?Medication Sig Start Date End Date Taking? Authorizing Provider  ?acetaminophen (TYLENOL) 500 MG tablet Take 1,000 mg by mouth at bedtime as needed for mild pain or headache.    [provider]  ?Carboxymethylcellulose Sod PF 0.5 % SOLN Place 1 drop into both eyes 4 (four) times daily as needed (dry eyes).    [provider]  ?Cholecalciferol (VITAMIN D3) 25 MCG (1000 UT) CAPS Take 1,000 Units by mouth daily.    [provider]  ?digoxin (LANOXIN) 0.125 MG tablet Take 1 tablet (0.125 mg total) by mouth daily. 04/24/21   O'NealCassie Freer, MD  ?empagliflozin (JARDIANCE) 10 MG TABS tablet Take 1 tablet (10 mg total) by mouth daily. 04/24/21   O'NealCassie Freer, MD  ?escitalopram (LEXAPRO) 20 MG tablet Take 20 mg by mouth daily. 03/09/21   [provider]  ?finasteride (PROSCAR) 5 MG tablet Take 5 mg by mouth daily at 12 noon.     [provider]  ?fluticasone (FLONASE) 50 MCG/ACT nasal spray Place 1 spray into both nostrils 2 (two) times daily. 10/24/17   [provider]   ?loratadine (CLARITIN) 10 MG tablet Take 10 mg by mouth daily.    [provider]  ?losartan (COZAAR) 25 MG tablet Take 1 tablet (25 mg total) by mouth daily. 04/24/21   O'NealCassie Freer, MD  ?metoprolol succinate (TOPROL-XL) 100 MG 24 hr tablet Take 1 tablet (100 mg total) by mouth daily. Take with or immediately following a meal. 12/13/19 06/11/21  Dahal, Marlowe Aschoff, MD  ?Multiple Vitamin (MULTIVITAMIN WITH MINERALS) TABS tablet Take 1 tablet by mouth daily at 12 noon.    [provider]  ?PHENobarbital (LUMINAL) 97.2 MG tablet Take 97.2 mg by mouth daily.    [provider]  ?pravastatin (PRAVACHOL) 40 MG tablet Take 40 mg by mouth at bedtime.     [provider]  ?spironolactone (ALDACTONE) 25 MG tablet Take 1 tablet (25 mg total) by mouth daily. 05/05/21   Lyda Jester M, PA-C  ?torsemide (DEMADEX) 20 MG tablet Take 2 tablets (40 mg total) by mouth daily. 05/05/21   Consuelo Pandy, PA-C  ?warfarin (COUMADIN) 5 MG tablet Take 5 mg by mouth daily. 11/22/20   [provider]  ?   ? ?Allergies    ?Dilaudid [hydromorphone hcl], Hydromorphone, Morphine sulfate, Tegretol [carbamazepine], and Carbamazepine   ? ?Review of Systems   ?Review of Systems  ?Constitutional:   ?     Per HPI, otherwise negative  ?HENT:    ?     Per HPI, otherwise negative  ?Respiratory:    ?  Per HPI, otherwise negative  ?Cardiovascular:   ?     Per HPI, otherwise negative  ?Gastrointestinal:  Negative for vomiting.  ?Endocrine:  ?     Negative aside from HPI  ?Genitourinary:   ?     Neg aside from HPI   ?Musculoskeletal:   ?     Per HPI, otherwise negative  ?Skin: Negative.   ?Neurological:  Negative for syncope.  ? ?Physical Exam ?Updated Vital Signs ?BP 106/81   Pulse (!) 119   Temp 97.7 ?F (36.5 ?C) (Oral)   Resp (!) 27   Ht _0  (1.727 m)   Wt 105.2 kg   SpO2 96%   BMI 35.28 kg/m?  ?Physical Exam ?Vitals and nursing note reviewed.  ?Constitutional:   ?   General: He is not in  acute distress. ?   Appearance: He is well-developed. He is obese. He is ill-appearing and diaphoretic. He is not toxic-appearing.  ?HENT:  ?   Head: Normocephalic and atraumatic.  ?Eyes:  ?   Conjunctiva/sclera: Conjunctivae normal.  ?Cardiovascular:  ?   Rate and Rhythm: Tachycardia present. Rhythm irregular.  ?Pulmonary:  ?   Effort: Tachypnea and accessory muscle usage present.  ?   Breath sounds: No stridor.  ?Abdominal:  ?   General: There is no distension.  ?Skin: ?   Comments: Throughout both lower extremities there are wildly spread areas of petechia without confluent erythema.  ?Neurological:  ?   Mental Status: He is alert and oriented to person, place, and time.  ? ? ?ED Results / Procedures / Treatments   ?Labs ?(all labs ordered are listed, but only abnormal results are displayed) ?Labs Reviewed  ?COMPREHENSIVE METABOLIC PANEL - Abnormal; Notable for the following components:  ?    Result Value  ? Potassium 5.7 (*)   ? CO2 21 (*)   ? Glucose, Bld 104 (*)   ? BUN 30 (*)   ? Calcium 8.2 (*)   ? Total Protein 6.0 (*)   ? Albumin 3.1 (*)   ? AST 268 (*)   ? ALT 448 (*)   ? Total Bilirubin 1.6 (*)   ? All other components within normal limits  ?CBC WITH DIFFERENTIAL/PLATELET - Abnormal; Notable for the following components:  ? WBC 12.6 (*)   ? RBC 4.18 (*)   ? Hemoglobin 12.7 (*)   ? RDW 15.8 (*)   ? Neutro Abs 9.3 (*)   ? All other components within normal limits  ?BRAIN NATRIURETIC PEPTIDE - Abnormal; Notable for the following components:  ? B Natriuretic Peptide 767.1 (*)   ? All other components within normal limits  ?PROTIME-INR - Abnormal; Notable for the following components:  ? Prothrombin Time 41.7 (*)   ? INR 4.4 (*)   ? All other components within normal limits  ?POTASSIUM - Abnormal; Notable for the following components:  ? Potassium 5.8 (*)   ? All other components within normal limits  ?RESP PANEL BY RT-PCR (FLU A&B, COVID) ARPGX2  ?DIGOXIN LEVEL  ?HEPATITIS PANEL, ACUTE  ?PHENOBARBITAL LEVEL   ?URINALYSIS, ROUTINE W REFLEX MICROSCOPIC  ? ? ?EKG ?EKG Interpretation ? ?Date/Time:  Tuesday May 17 2021 11:57:59 EDT ?Ventricular Rate:  115 ?PR Interval:    ?QRS Duration: 148 ?QT Interval:  364 ?QTC Calculation: 504 ?R Axis:   -35 ?Text Interpretation: Atrial fibrillation Paired ventricular premature complexes Right bundle branch block Abnormal ECG Confirmed by Carmin Muskrat [phone]) on 05/17/2021 12:27:19 PM ? ?Radiology ?DG Chest  Port 1 View ? ?Result Date: 05/17/2021 ?CLINICAL DATA:  -shortness of breath for 1 month. EXAM: PORTABLE CHEST 1 VIEW COMPARISON:  04/19/2021 FINDINGS: 1221 hours. The cardio pericardial silhouette is enlarged. The lungs are clear without focal pneumonia, edema, pneumothorax or pleural effusion. Stable streaky opacity at the left base compatible with atelectasis or scarring. The visualized bony structures of the thorax are unremarkable. Telemetry leads overlie the chest. IMPRESSION: No active disease. Electronically Signed   By: Misty Stanley M.D.   On: 05/17/2021 12:40   ? ?Procedures ?Procedures  ? ? ?Medications Ordered in ED ?Medications  ?furosemide (LASIX) injection 80 mg (80 mg Intravenous Given 05/17/21 1354)  ? ? ?ED Course/ Medical Decision Making/ A&P ?This patient presents to the ED for concern of respiratory distress, tachycardia, this involves an extensive number of treatment options, and is a complaint that carries with it a high risk of complications and morbidity.  The differential diagnosis includes COPD versus CHF versus A-fib with rapid ventricular response, or multifactorial with additional consideration of concurrent infection as the patient was found to have bedbugs on EMS arrival. ? ? ?Co morbidities that complicate the patient evaluation ? ?Obesity, heart failure, A-fib, COPD ? ?Social Determinants of Health: ? ?Obesity, advanced age, poor living situation ? ?Additional history obtained: ? ?Additional history and/or information obtained from EMS, chart  review.  EMS notes that the patient was found to have bedbugs in the house.  However, the patient states that he has been cleaning his house, has been showering, and no additional bedbugs were visualized on initial evaluatio

## 2021-05-17 NOTE — Telephone Encounter (Signed)
Called to confirm Heart & Vascular Transitions of Care appointment at [facility] today. Patient reminded to bring all medications and pill box organizer with them. Confirmed patient has transportation. Gave directions, instructed to utilize valet parking--pt will have neighbor bring.  ? ?Pt actively SOB while talking on the phone. Audible wheezes noted. Pt states he could not walk without getting SOB, encouraged patient to attend appt d/t noted symptoms. Pt thinks he is taking his medication correctly. ?  ? ?Confirmed appointment prior to ending call.  ? ?Earnestine Leys, BSN, RN ?Heart Failure Nurse Navigator ?[phone] ? ? ?

## 2021-05-17 NOTE — Progress Notes (Signed)
ANTICOAGULATION CONSULT NOTE - Initial Consult ? ?Pharmacy Consult for warfarin ?Indication: atrial fibrillation/atrial flutter ? ?Allergies  ?Allergen Reactions  ? Dilaudid [Hydromorphone Hcl] Other (See Comments)  ?  Makes pt hyper   ? Hydromorphone Other (See Comments)  ?  hyperactiviity ?Other reaction(s): made him wild  ? Morphine Sulfate   ?  Other reaction(s): at high dose causes confusion  ? Tegretol [Carbamazepine] Hives  ? Carbamazepine Hives and Rash  ?  Other reaction(s): hives  ? ? ?Patient Measurements: ?Height: _0  (172.7 cm) ?Weight: 105.2 kg (232 lb) ?IBW/kg (Calculated) : 68.4 ? ?Vital Signs: ?Temp: 97.7 ?F (36.5 ?C) (03/14 1147) ?Temp Source: Oral (03/14 1147) ?BP: 106/81 (03/14 1500) ?Pulse Rate: 119 (03/14 1500) ? ?Labs: ?Recent Labs  ?  05/17/21 ?1206  ?HGB 12.7*  ?HCT 40.6  ?PLT 226  ?LABPROT 41.7*  ?INR 4.4*  ?CREATININE 1.23  ? ? ?Estimated Creatinine Clearance: 59.1 mL/min (by C-G formula based on SCr of 1.23 mg/dL). ? ? ?Medical History: ?Past Medical History:  ?Diagnosis Date  ? Acquired thrombophilia ([place]) 01/15/2019  ? Aortic atherosclerosis ([place])   ? Aortic root dilatation ([facility])   ? CAP (community acquired pneumonia) 03/06/2016  ? Chronic laryngitis 10/03/2016  ? Chronic systolic CHF (congestive heart failure) ([place])   ? COPD GOLD II  08/02/2016  ? Quit smoking 2000  PFT's  07/10/2016  FEV1 1.98 (70 % ) ratio 67  p 19 % improvement from saba p nothing  prior to study while of coreg so rec as of 08/02/2016 try off coreg and on bisoprolol     ? Essential hypertension 03/06/2016  ? Changed from coreg to bisoprolol due to copd with reversible component  08/02/2016 >>>   ? Hoarseness 08/31/2016  ? Referred to ent 08/31/2016 >>> seen 10/03/16 [name] dx gerd and rhinitis medicamentosa   > improved on f/u 01/31/17 on gerd rx/ flonase and off afrin  ? Hyperlipidemia   ? Hypertension   ? Laryngopharyngeal reflux (LPR) 10/03/2016  ? Mitral regurgitation   ? Moderate persistent asthma 08/02/2016  ?  Morbid (severe) obesity due to excess calories ([place]) 08/02/2016  ? Nasal polyps 10/03/2016  ? NICM (nonischemic cardiomyopathy) ([place])   ? a. EF 40-45% in 04/2016, etiology not defined, managed medically.  ? OSA on CPAP   ? Permanent atrial fibrillation ([place])   ? Prolonged QT interval 10/29/2017  ? RBBB   ? Recurrent erosion of cornea 06/23/2011  ? Rhinitis medicamentosa 10/03/2016  ? Right thyroid nodule 03/06/2016  ? Seizures ([place])   ? "take RX daily" (03/06/2016)  ? Sensorineural hearing loss (SNHL) of both ears 01/31/2017  ? Tricuspid regurgitation   ? ? ?Medications:  ?(Not in a hospital admission)  ?Scheduled:  ? [START ON 05/18/2021] finasteride  5 mg Oral Q1200  ? furosemide  80 mg Intravenous BID  ? metoprolol tartrate  50 mg Oral BID  ? ?Infusions:  ? ?PTA Regimen per patient: Warfarin 5 mg daily ?Last clinic INR per patient: 2.5 ?Unable to read last clinic note with [facility] ? ?Assessment: ?Patient reported with dyspnea, weakness, and palpitations. Patient has a PMH of  systolic HF, pulmonary HTN, mild MR, mild-moderate TR, and dilation of the aortic root, and history of stroke in 2022 taking warfarin PTA for atrial fibrillation/flutter. No mechanical valve documented or reported by patient. INR supratherapeutic on admission at 4.4. CBC stable, no bleeding reported. ? ?Goal of Therapy:  ?INR 2-3 ?  ?Plan:  ?Hold warfarin tonight with  elevated INR ?Daily INR. Baseline CBC ? ?Thank you for allowing pharmacy to participate in this patient's care. ? ?Reatha Harps, PharmD ?PGY1 Pharmacy Resident ?05/17/2021 4:41 PM ?Check [email or web address] for unit specific pharmacy number ? ? ? ?

## 2021-05-17 NOTE — Progress Notes (Signed)
Heart Failure Nurse Navigator Progress Note ? ?Following this hospitalization to assess for HV TOC readiness. Pt called this AM by HF Navigator for HV TOC appt reminder scheduled for today which pt stated he was going to call his neighbor for a ride. Pt was audibly SOB and wheezing on the phone---encouraged pt to attend appt as he stated he did not want to come d/t transportation issues.  ? ?Neighbor called with update they called EMS as pt lips were blue in color. Pt en-route [place].  ? ?Will plan to interview when patient admitted/on unit.  ? ?Earnestine Leys, BSN, RN ?Heart Failure Nurse Navigator ?[phone] ? ?

## 2021-05-17 NOTE — H&P (Addendum)
?Cardiology Admission History and Physical:  ? ?Patient ID: Ruben Murray ?MRN: [id number]; DOB: November 19, 1945  ? ?Admission date: 05/17/2021 ? ?PCP:  [name], MD ?  ?[facility] ?Cardiologist:  [name], MD  ?Electrophysiologist:  [name], MD     ? ? ?Chief Complaint:  shortness of breath ? ?Patient Profile:  ? ?Ruben Murray is a 76 y.o. male with chronic systolic CHF/NICM, permanent atrial fibrillation and prior atrial flutter, pulmonary HTN (felt combination WHO group 2 & 3), mild MR, mild-moderate TR, mild dilation of aortic root, hypertension, OSA, obesity, seizure, aortic atherosclerosis, COPD, hypertension, HLD, RBBB, QT prolongation who is being seen 05/17/2021 for the evaluation of shortness of breath, decompensated heart failure and AF RVR. ? ?Medication nonadherence is suspected to be contributing. He also has suffered recent bedbug infestation.  ? ?History of Present Illness:  ? ?The patient was diagnosed with atrial fibrillation in 2018 shortly after having PNA, EF 45% at that time. He initially converted to NSR with diltiazem then meds were adjusted given his LV dysfunction. Over the last several years he has had intermittent recurrence of afib/flutter as well as varying degrees of LV dysfunction/CHF requiring diuresis. He has been anticoagulated with warfarin because of drug interaction between Eliquis/Xarelto and phenobarbital - PCP Dr. Mannie Stabile follows his INR. He was started on amiodarone in 2020 and has required several cardioversions over the years. He was admitted 10/2019 for LLE cellulitis, Covid, atrial flutter, and EF 20-25% and cath 11/2019 showed normal coronaries. At [facility] in 03/2020, he was out of rhythm but did not follow-up for cardioversion as planned. He was lost to follow-up between 03/2020 and 04/2021 when he returned to the hospital with worsening dyspnea, rapid AF and CHF. He had been under a lot of stress as he was the primary caregiver for his wife who  has dementia and suffered 2 hip fractures earlier this year. Per med rec upon that admission, he was no longer taking his amiodarone, losartan, digoxin, or Lasix. (Of note, he previously stopped Entresto due to cost.) 2D echocardiogram 04/20/21 showed continued LV dysfunction EF 20-25%, global HK, moderate LV dilation, mildly reduced RV function, moderately elevated PASP, moderate BAE, mild MR, mild-moderate TR, mild AI, mild dilation of aortic root, dilated IVC - EF consistent with prior study in 2021. He was diuresed and started back on GDMT. Amiodarone was felt no longer of benefit so this was discontinued. He saw Dr. Curt Bears back to consider CRT-D with possible AVN ablation if he developed recurrent RVR, planned for 06/15/21. He saw the HF TOC impact clinic on 3/1 at which time he was felt to be volume up so torsemide and spironolactone doses were increased. He saw Dr. [name] on 3/7 and was felt to be clinically stable. ? ?Of note, digoxin level on 3/1 was <0.2. Although the patient affirms he is taking all of his medication as prescribed, we called [facility] [place] [facility] and they report he never picked up his digoxin or Jardiance, and did not pick up his torsemide or spironolactone until 3/11 (10 days after the doses were intended to be increased on 3/1). They also did not have metoprolol on file. The patient also reports using a mail order pharmacy at times but states he's not been getting any recent shipments. Of note, at recent DC on 2/19, the patient [facility] [place] per our conversation. [facility] pharmacy was closed that day so we specifically had to clarify.) ? ?He  presented back to [facility] today by EMS with worsening SOB and weakness, found to be in AF RVR HR 130s with PVCs en route, initial BP 88/60, recheck 102/70. He states his dyspnea has never fully improved after last hospitalization but was much worse the last few days to the point where even a few steps caused dyspnea. He also  noticed worsening lower extremity edema and abdominal bloating. No chest pain. He gets regular meals from [employer] and they otherwise avoid salt. He does not report any orthopnea but always sleeps in a recliner due to chronic back issues. He has multiple petechiae over his lower extremities which occurred around the time he began having itching. His home was found to be infested with bed bugs and he recently had it sprayed as part of a 4 week treatment. He is unsure how he can continue to care for himself and his wife in this condition. His pastor Ronalee Belts is at bedside with patient's permission who does state he's been offered a lot of support. He reports he did not yet take any of his medicines today. Here, labs notable for hyperkalemia 5.7-5.8 (redrawn on same original sample), BUN 30, Cr 1.23, albumin 3.1, AST 268/ALT 448, Tbili 1.6, leukocytosis 12.6, Hgb 12.7, INR 4.4, BNP 767. Covid/flu/digoxin pending. CXR NAD.  ? ? ?Past Medical History:  ?Diagnosis Date  ? Acquired thrombophilia ([place]) 01/15/2019  ? Aortic atherosclerosis ([place])   ? Aortic root dilatation ([facility])   ? CAP (community acquired pneumonia) 03/06/2016  ? Chronic laryngitis 10/03/2016  ? Chronic systolic CHF (congestive heart failure) ([place])   ? COPD GOLD II  08/02/2016  ? Quit smoking 2000  PFT's  07/10/2016  FEV1 1.98 (70 % ) ratio 67  p 19 % improvement from saba p nothing  prior to study while of coreg so rec as of 08/02/2016 try off coreg and on bisoprolol     ? Essential hypertension 03/06/2016  ? Changed from coreg to bisoprolol due to copd with reversible component  08/02/2016 >>>   ? Hoarseness 08/31/2016  ? Referred to ent 08/31/2016 >>> seen 10/03/16 [name] dx gerd and rhinitis medicamentosa   > improved on f/u 01/31/17 on gerd rx/ flonase and off afrin  ? Hyperlipidemia   ? Hypertension   ? Laryngopharyngeal reflux (LPR) 10/03/2016  ? Mitral regurgitation   ? Moderate persistent asthma 08/02/2016  ? Morbid (severe) obesity due to excess  calories ([place]) 08/02/2016  ? Nasal polyps 10/03/2016  ? NICM (nonischemic cardiomyopathy) ([place])   ? a. EF 40-45% in 04/2016, etiology not defined, managed medically.  ? OSA on CPAP   ? Permanent atrial fibrillation ([place])   ? Prolonged QT interval 10/29/2017  ? RBBB   ? Recurrent erosion of cornea 06/23/2011  ? Rhinitis medicamentosa 10/03/2016  ? Right thyroid nodule 03/06/2016  ? Seizures ([place])   ? "take RX daily" (03/06/2016)  ? Sensorineural hearing loss (SNHL) of both ears 01/31/2017  ? Tricuspid regurgitation   ? ? ?Past Surgical History:  ?Procedure Laterality Date  ? CARDIOVERSION N/A 12/13/2017  ? Procedure: CARDIOVERSION;  Surgeon: [name], MD;  Location: [facility];  Service: Cardiovascular;  Laterality: N/A;  ? CARDIOVERSION N/A 09/25/2018  ? Procedure: CARDIOVERSION;  Surgeon: [name], MD;  Location: [facility];  Service: Cardiovascular;  Laterality: N/A;  ? CARDIOVERSION N/A 11/04/2018  ? Procedure: CARDIOVERSION;  Surgeon: [name], MD;  Location: [facility];  Service: Cardiovascular;  Laterality: N/A;  ? CARDIOVERSION N/A 04/11/2019  ?  Procedure: CARDIOVERSION;  Surgeon: [name], MD;  [facility] ENDOSCOPY;  Service: Cardiovascular;  Laterality: N/A;  ? CIRCUMCISION    ? JOINT REPLACEMENT    ? PERCUTANEOUS PINNING TOE FRACTURE Left   ? "big toe  ? REPLACEMENT TOTAL KNEE Left   ? RIGHT/LEFT HEART CATH AND CORONARY ANGIOGRAPHY N/A 11/06/2019  ? Procedure: RIGHT/LEFT HEART CATH AND CORONARY ANGIOGRAPHY;  Surgeon: [name], MD;  Location: [facility] CV LAB;  Service: Cardiovascular;  Laterality: N/A;  ? TONSILLECTOMY AND ADENOIDECTOMY    ?  ? ?Medications Prior to Admission: ?Prior to Admission medications   ?Medication Sig Start Date End Date Taking? Authorizing Provider  ?acetaminophen (TYLENOL) 500 MG tablet Take 1,000 mg by mouth at bedtime as needed for mild pain or headache.    [provider]  ?Carboxymethylcellulose Sod PF 0.5 % SOLN Place 1 drop  into both eyes 4 (four) times daily as needed (dry eyes).    [provider]  ?Cholecalciferol (VITAMIN D3) 25 MCG (1000 UT) CAPS Take 1,000 Units by mouth daily.    [provider]  ?digoxin (

## 2021-05-17 NOTE — ED Triage Notes (Signed)
Pt BIB [facility] from home calling for afib and SOB x 1 month, getting worse. Patient reports dyspnea w/ exertion, getting worse. EMS reports HR 130s w/ PVCs en route, initial pressure 88/60, increased to 102/70 en route. ?

## 2021-05-17 NOTE — Progress Notes (Addendum)
Per infection prevention return call, patient does not need to be on contact precautions, only standard precautions per their guidelines. They also outlined a nursing protocol available on the [facility] website that addresses pt's belongings to send home with family and reporting to covering floor. I notified nurse to make her aware. ? ?Addendum: per nurse, 813m in scanner prior to I/O, removed 1025. If labs are not back by end of shift will be signing out to on call APP to follow up med plans. Tentatively awaiting potassium before resuming digoxin, awaiting phenobarbital level and pharmacy input regarding phenobarbital resumption, and also home med rec for clarification of whether pt taking escitalopram (has h/o QT prolongation so would be prudent not to resume if he has not been taking this). ?

## 2021-05-17 NOTE — Progress Notes (Addendum)
MEDICATION RELATED CONSULT NOTE - INITIAL   Pharmacy Consult to evaluate phenobarbital dose   Allergies  Allergen Reactions   Dilaudid [Hydromorphone Hcl] Other (See Comments)    Makes pt hyper    Hydromorphone Other (See Comments)    hyperactiviity Other reaction(s): made him wild   Morphine Sulfate     Other reaction(s): at high dose causes confusion   Tegretol [Carbamazepine] Hives   Carbamazepine Hives and Rash    Other reaction(s): hives    Patient Measurements: Height: 5\' 8"  (172.7 cm) Weight: 105.2 kg (232 lb) IBW/kg (Calculated) : 68.4  Vital Signs: Temp: 97.7 F (36.5 C) (03/14 1147) Temp Source: Oral (03/14 1147) BP: 112/86 (03/14 1800) Pulse Rate: 100 (03/14 1800) Intake/Output from previous day: No intake/output data recorded. Intake/Output from this shift: No intake/output data recorded.  Labs: Recent Labs    05/17/21 1206  WBC 12.6*  HGB 12.7*  HCT 40.6  PLT 226  CREATININE 1.23  ALBUMIN 3.1*  PROT 6.0*  AST 268*  ALT 448*  ALKPHOS 112  BILITOT 1.6*   Estimated Creatinine Clearance: 59.1 mL/min (by C-G formula based on SCr of 1.23 mg/dL).   Microbiology: Recent Results (from the past 720 hour(s))  Resp Panel by RT-PCR (Flu A&B, Covid) Nasopharyngeal Swab     Status: None   Collection Time: 04/19/21 11:25 AM   Specimen: Nasopharyngeal Swab; Nasopharyngeal(NP) swabs in vial transport medium  Result Value Ref Range Status   SARS Coronavirus 2 by RT PCR NEGATIVE NEGATIVE Final    Comment: (NOTE) SARS-CoV-2 target nucleic acids are NOT DETECTED.  The SARS-CoV-2 RNA is generally detectable in upper respiratory specimens during the acute phase of infection. The lowest concentration of SARS-CoV-2 viral copies this assay can detect is 138 copies/mL. A negative result does not preclude SARS-Cov-2 infection and should not be used as the sole basis for treatment or other patient management decisions. A negative result may occur with  improper  specimen collection/handling, submission of specimen other than nasopharyngeal swab, presence of viral mutation(s) within the areas targeted by this assay, and inadequate number of viral copies(<138 copies/mL). A negative result must be combined with clinical observations, patient history, and epidemiological information. The expected result is Negative.  Fact Sheet for Patients:  BloggerCourse.com  Fact Sheet for Healthcare Providers:  SeriousBroker.it  This test is no t yet approved or cleared by the [place] FDA and  has been authorized for detection and/or diagnosis of SARS-CoV-2 by FDA under an Emergency Use Authorization (EUA). This EUA will remain  in effect (meaning this test can be used) for the duration of the COVID-19 declaration under Section 564(b)(1) of the Act, 21 U.S.C.section 360bbb-3(b)(1), unless the authorization is terminated  or revoked sooner.       Influenza A by PCR NEGATIVE NEGATIVE Final   Influenza B by PCR NEGATIVE NEGATIVE Final    Comment: (NOTE) The Xpert Xpress SARS-CoV-2/FLU/RSV plus assay is intended as an aid in the diagnosis of influenza from Nasopharyngeal swab specimens and should not be used as a sole basis for treatment. Nasal washings and aspirates are unacceptable for Xpert Xpress SARS-CoV-2/FLU/RSV testing.  Fact Sheet for Patients: BloggerCourse.com  Fact Sheet for Healthcare Providers: SeriousBroker.it  This test is not yet approved or cleared by the [place] FDA and has been authorized for detection and/or diagnosis of SARS-CoV-2 by FDA under an Emergency Use Authorization (EUA). This EUA will remain in effect (meaning this test can be used) for the duration of the  COVID-19 declaration under Section 564(b)(1) of the Act, 21 U.S.C. section 360bbb-3(b)(1), unless the authorization is terminated or revoked.  Performed at  [facility], [address], [place], [place] [address]   Resp Panel by RT-PCR (Flu A&B, Covid) Nasopharyngeal Swab     Status: None   Collection Time: 05/17/21 12:39 PM   Specimen: Nasopharyngeal Swab; Nasopharyngeal(NP) swabs in vial transport medium  Result Value Ref Range Status   SARS Coronavirus 2 by RT PCR NEGATIVE NEGATIVE Final    Comment: (NOTE) SARS-CoV-2 target nucleic acids are NOT DETECTED.  The SARS-CoV-2 RNA is generally detectable in upper respiratory specimens during the acute phase of infection. The lowest concentration of SARS-CoV-2 viral copies this assay can detect is 138 copies/mL. A negative result does not preclude SARS-Cov-2 infection and should not be used as the sole basis for treatment or other patient management decisions. A negative result may occur with  improper specimen collection/handling, submission of specimen other than nasopharyngeal swab, presence of viral mutation(s) within the areas targeted by this assay, and inadequate number of viral copies(<138 copies/mL). A negative result must be combined with clinical observations, patient history, and epidemiological information. The expected result is Negative.  Fact Sheet for Patients:  BloggerCourse.com  Fact Sheet for Healthcare Providers:  SeriousBroker.it  This test is no t yet approved or cleared by the [place] FDA and  has been authorized for detection and/or diagnosis of SARS-CoV-2 by FDA under an Emergency Use Authorization (EUA). This EUA will remain  in effect (meaning this test can be used) for the duration of the COVID-19 declaration under Section 564(b)(1) of the Act, 21 U.S.C.section 360bbb-3(b)(1), unless the authorization is terminated  or revoked sooner.       Influenza A by PCR NEGATIVE NEGATIVE Final   Influenza B by PCR NEGATIVE NEGATIVE Final    Comment: (NOTE) The Xpert Xpress SARS-CoV-2/FLU/RSV plus assay  is intended as an aid in the diagnosis of influenza from Nasopharyngeal swab specimens and should not be used as a sole basis for treatment. Nasal washings and aspirates are unacceptable for Xpert Xpress SARS-CoV-2/FLU/RSV testing.  Fact Sheet for Patients: BloggerCourse.com  Fact Sheet for Healthcare Providers: SeriousBroker.it  This test is not yet approved or cleared by the [place] FDA and has been authorized for detection and/or diagnosis of SARS-CoV-2 by FDA under an Emergency Use Authorization (EUA). This EUA will remain in effect (meaning this test can be used) for the duration of the COVID-19 declaration under Section 564(b)(1) of the Act, 21 U.S.C. section 360bbb-3(b)(1), unless the authorization is terminated or revoked.  Performed at [facility], [address] [place], [place] [address]     Medical History: Past Medical History:  Diagnosis Date   Acquired thrombophilia (HCC) 01/15/2019   Aortic atherosclerosis (HCC)    Aortic root dilatation (HCC)    CAP (community acquired pneumonia) 03/06/2016   Chronic laryngitis 10/03/2016   Chronic systolic CHF (congestive heart failure) (HCC)    COPD GOLD II  08/02/2016   Quit smoking 2000  PFT's  07/10/2016  FEV1 1.98 (70 % ) ratio 67  p 19 % improvement from saba p nothing  prior to study while of coreg so rec as of 08/02/2016 try off coreg and on bisoprolol      Essential hypertension 03/06/2016   Changed from coreg to bisoprolol due to copd with reversible component  08/02/2016 >>>    Hoarseness 08/31/2016   Referred to ent 08/31/2016 >>> seen  10/03/16 Jenne Pane dx gerd and rhinitis medicamentosa   > improved on f/u 01/31/17 on gerd rx/ flonase and off afrin   Hyperlipidemia    Hypertension    Laryngopharyngeal reflux (LPR) 10/03/2016   Mitral regurgitation    Moderate persistent asthma 08/02/2016   Morbid (severe) obesity due to excess calories (HCC) 08/02/2016    Nasal polyps 10/03/2016   NICM (nonischemic cardiomyopathy) (HCC)    a. EF 40-45% in 04/2016, etiology not defined, managed medically.   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 He claims to take the medications according to a printed list that his primary physician gives him after each visit. He denies nonadherence, however with confusion regarding medication regimen and symptoms at presentation, I do suspect nonadherence. An accurate medication history will be hard to obtain unless the patient has a family member bring in his list. He reports that his niece may be able to bring in the list.   It is very possible that the patient's phenobarbital level is subtherapeutic due to nonadherence. Patient has acutely elevated LFTs and total bilirubin. I would recommend no change in medication therapy at this time.   Plan:  Start home dose of phenobarbital 97.2 mg every night at bedtime Check level in 4-5 days to verify phenobarbital does not become supratherapeutic with acute liver dysfunction Continue to monitor LFTs and T bili for worsening or for resolution after congestion improves  Marygrace Drought 05/17/2021,8:52 PM

## 2021-05-17 NOTE — ED Notes (Signed)
Lab adding on potassium to previously drawn specimen. ?

## 2021-05-18 ENCOUNTER — Encounter (HOSPITAL_COMMUNITY): Payer: Self-pay | Admitting: Cardiology

## 2021-05-18 ENCOUNTER — Other Ambulatory Visit: Payer: Self-pay

## 2021-05-18 DIAGNOSIS — I5023 Acute on chronic systolic (congestive) heart failure: Secondary | ICD-10-CM | POA: Diagnosis not present

## 2021-05-18 LAB — CBC WITH DIFFERENTIAL/PLATELET
Abs Immature Granulocytes: 0.05 10*3/uL (ref 0.00–0.07)
Basophils Absolute: 0 10*3/uL (ref 0.0–0.1)
Basophils Relative: 0 %
Eosinophils Absolute: 0.3 10*3/uL (ref 0.0–0.5)
Eosinophils Relative: 2 %
HCT: 37.3 % — ABNORMAL LOW (ref 39.0–52.0)
Hemoglobin: 12.3 g/dL — ABNORMAL LOW (ref 13.0–17.0)
Immature Granulocytes: 1 %
Lymphocytes Relative: 25 %
Lymphs Abs: 2.7 10*3/uL (ref 0.7–4.0)
MCH: 30.9 pg (ref 26.0–34.0)
MCHC: 33 g/dL (ref 30.0–36.0)
MCV: 93.7 fL (ref 80.0–100.0)
Monocytes Absolute: 1.1 10*3/uL — ABNORMAL HIGH (ref 0.1–1.0)
Monocytes Relative: 10 %
Neutro Abs: 6.6 10*3/uL (ref 1.7–7.7)
Neutrophils Relative %: 62 %
Platelets: 205 10*3/uL (ref 150–400)
RBC: 3.98 MIL/uL — ABNORMAL LOW (ref 4.22–5.81)
RDW: 15.5 % (ref 11.5–15.5)
WBC: 10.7 10*3/uL — ABNORMAL HIGH (ref 4.0–10.5)
nRBC: 0 % (ref 0.0–0.2)

## 2021-05-18 LAB — COMPREHENSIVE METABOLIC PANEL
ALT: 349 U/L — ABNORMAL HIGH (ref 0–44)
AST: 168 U/L — ABNORMAL HIGH (ref 15–41)
Albumin: 2.9 g/dL — ABNORMAL LOW (ref 3.5–5.0)
Alkaline Phosphatase: 104 U/L (ref 38–126)
Anion gap: 9 (ref 5–15)
BUN: 30 mg/dL — ABNORMAL HIGH (ref 8–23)
CO2: 31 mmol/L (ref 22–32)
Calcium: 8.3 mg/dL — ABNORMAL LOW (ref 8.9–10.3)
Chloride: 100 mmol/L (ref 98–111)
Creatinine, Ser: 1.26 mg/dL — ABNORMAL HIGH (ref 0.61–1.24)
GFR, Estimated: 59 mL/min — ABNORMAL LOW (ref >60)
Glucose, Bld: 97 mg/dL (ref 70–99)
Potassium: 4.3 mmol/L (ref 3.5–5.1)
Sodium: 140 mmol/L (ref 135–145)
Total Bilirubin: 1 mg/dL (ref 0.3–1.2)
Total Protein: 5.7 g/dL — ABNORMAL LOW (ref 6.5–8.1)

## 2021-05-18 LAB — PROTIME-INR
INR: 4.1 (ref 0.8–1.2)
Prothrombin Time: 39.5 seconds — ABNORMAL HIGH (ref 11.4–15.2)

## 2021-05-18 NOTE — Consult Note (Addendum)
? ?  [facility] CM Inpatient Consult ? ? ?05/18/2021 ? ?Ruben Murray ?Aug 21, 1945 ?[id number] ? ?[place] [facility] [ACO] Patient: [name] ? ?4:00 pm Addendum:  Confirmed with liaison at [facility] patient is active in their Embedded progam ? ?Primary Care Provider:  [name], MD, Is an [facility] provider is an [place] [facility]  ? ?Patient is currently active with [place] [facility] for chronic disease management services.  Patient has been engaged by a [place].  Our community based plan of care has focused on disease management and community resource support.   ? ?Met with the patient at the bedside.  Patient confirms and endorses [facility] as his provider and that he has been working with the provider and a new nurse that was supposed to make a home visit today regarding assisting him and his wife with additional services in the [place].  He confirms he is at [place] with his wife in which he is the caregiver for. ? ?Plan: Will update the [facility] RNCM of the patient active in an external care management program. Will reach out to external hospital liaison regarding, if this is an active member or not. ? ?Of note, [facility] Care Management services does not replace or interfere with any services that are needed or arranged by inpatient [facility] care management team.  For additional questions or referrals please contact: ? ?Natividad Brood, RN BSN CCM ?[facility] Liaison ? [phone] business mobile phone ?Toll free office [phone]  ?Fax number: [phone] ?[email or web address] ?[email or web address] ? ?  ? ? ?

## 2021-05-18 NOTE — Progress Notes (Signed)
Refused ? ?

## 2021-05-18 NOTE — Progress Notes (Signed)
Heart Failure Nurse Navigator Progress Note  ? ?Spoke with patient at bedside following his admission 3/14,for SOB. (Was scheduled for TOC appt same day, however cancelled, d/t he had to bring his car into the shop, ) Stated he has been taking his meds correctly. Upon admission, it was discovered that patient has been having his house treated for bed bugs for which he has bite marks on his legs from. Patient stated he was looking at more insurance coverage for his wife and himself to help cover medical cost and get some help in the home. He feels overwhelmed and tired after being the caregiver to his wife. He currently sleeps in his recliner chair due to his back issues and has stopped using his CPAP machine as well. "States his breathing doctor said it was ok to do so'" ? ?Patient states that he can get his medication and even uses a weekly pill box to organize them for himself and his wife too.  ? ?[facility] appointment set for 05/27/21.  ? ?Earnestine Leys, BSN, RN ?Heart Failure Nurse Navigator ?[phone]  ?

## 2021-05-18 NOTE — Progress Notes (Signed)
ANTICOAGULATION CONSULT NOTE ? ?Pharmacy Consult for Warfarin ?Indication: atrial fibrillation ? ?Allergies  ?Allergen Reactions  ? Dilaudid [Hydromorphone Hcl] Other (See Comments)  ?  Makes pt hyper   ? Hydromorphone Other (See Comments)  ?  hyperactiviity ?Other reaction(s): made him wild  ? Morphine Sulfate   ?  Other reaction(s): at high dose causes confusion  ? Tegretol [Carbamazepine] Hives  ? Carbamazepine Hives and Rash  ?  Other reaction(s): hives  ? ? ?Patient Measurements: ?Height: _0  (172.7 cm) ?Weight: 109.2 kg (240 lb 11.9 oz) ?IBW/kg (Calculated) : 68.4 ? ? ?Vital Signs: ?Temp: 98.3 ?F (36.8 ?C) (03/15 9735) ?Temp Source: Oral (03/15 3299) ?BP: 111/85 (03/15 0721) ?Pulse Rate: 95 (03/15 0721) ? ?Labs: ?Recent Labs  ?  05/17/21 ?1206 05/18/21 ?0340  ?HGB 12.7* 12.3*  ?HCT 40.6 37.3*  ?PLT 226 205  ?LABPROT 41.7* 39.5*  ?INR 4.4* 4.1*  ?CREATININE 1.23 1.26*  ? ? ?Estimated Creatinine Clearance: 58.8 mL/min (A) (by C-G formula based on SCr of 1.26 mg/dL (H)). ? ? ?PTA Regimen per patient:  ?Warfarin 5 mg daily ?Last clinic INR per patient: 2.5 ?Unable to read last clinic note with [facility] ? ?Assessment: ?76 y.o. male with medical history significant for permanent atrial fibrillation and prior atrial flutter who presented with shortness of breath, decompensated heart failure and AF RVR. Pharmacy consulted to manage warfarin. ? ?INR elevated at 4.4 upon admission. Warfarin dose held 3/14. INR remains elevated at 4.1. Will hold dose again this evening. CBC remains stable with no s/sx bleeding reported. ? ?Goal of Therapy:  ?INR 2-3 ?Monitor platelets by anticoagulation protocol: Yes ?  ?Plan:  ?Hold warfarin tonight d/t supratherapeutic INR ?Check INR daily while on warfarin ?Continue to monitor H&H and platelets ? ? ? ?Thank you for allowing pharmacy to be a part of this patient?s care. ? ?Ardyth Harps, PharmD ?Clinical Pharmacist ? ? ?

## 2021-05-18 NOTE — Patient Outreach (Signed)
[facility]) Care Management ? ?05/18/2021 ? ?ROCHESTER SERPE ?1945-12-17 ?[id number] ? ? ?Patient currently hospitalized.  Notified by [facility] Liaison that patient will be a part of [facility] services. ? ?Plan: RN CM will close case.   ? ?[name], RN, MSN ?[facility] Care Management ?Care Management Coordinator ?Direct Line [phone] ?Toll Free: [phone]  ?Fax: [phone] ? ?

## 2021-05-18 NOTE — Progress Notes (Signed)
? ?Progress Note ? ?Patient Name: Ruben Murray ?Date of Encounter: 05/18/2021 ? ?Primary Cardiologist:   [name], MD ? ? ?Subjective  ? ?He is breathing better and less exhausted.  Walked to the bathroom.  No pain or palpitations.  ? ?Inpatient Medications  ?  ?Scheduled Meds: ? digoxin  0.125 mg Oral Daily  ? finasteride  5 mg Oral Q1200  ? fluticasone  1 spray Each Nare BID  ? furosemide  80 mg Intravenous BID  ? loratadine  10 mg Oral Daily  ? metoprolol tartrate  50 mg Oral BID  ? phenobarbital  97.2 mg Oral QHS  ? sodium chloride flush  3 mL Intravenous Q12H  ? Warfarin - Pharmacist Dosing Inpatient   Does not apply Q2595  ? ?Continuous Infusions: ? sodium chloride    ? ?PRN Meds: ?sodium chloride, polyvinyl alcohol, sodium chloride flush  ? ?Vital Signs  ?  ?Vitals:  ? 05/18/21 0706 05/18/21 0721 05/18/21 0727 05/18/21 0732  ?BP:  111/85    ?Pulse: 91 95    ?Resp: _0 ?Temp:  98.3 ?F (36.8 ?C)    ?TempSrc:  Oral    ?SpO2: 96% 95%    ?Weight:      ?Height:      ? ? ?Intake/Output Summary (Last 24 hours) at 05/18/2021 0955 ?Last data filed at 05/18/2021 0758 ?Gross per 24 hour  ?Intake 720 ml  ?Output 3575 ml  ?Net -2855 ml  ? ?Filed Weights  ? 05/17/21 1147 05/17/21 2307 05/18/21 0500  ?Weight: 105.2 kg 110.1 kg 109.2 kg  ? ? ?Telemetry  ?  ?Atrial fib with controlled rate  - Personally Reviewed ? ?ECG  ?  ?NA - Personally Reviewed ? ?Physical Exam  ? ?GEN: No acute distress.   ?Neck: No  JVD ?Cardiac: Irregular RR, no murmurs, rubs, or gallops.  ?Respiratory: Clear  to auscultation bilaterally. ?GI: Soft, nontender, non-distended  ?MS:    Moderate edema; No deformity. ?Neuro:  Nonfocal  ?Psych: Normal affect  ? ?Labs  ?  ?Chemistry ?Recent Labs  ?Lab 05/17/21 ?1206 05/17/21 ?1631 05/18/21 ?0340  ?NA 136  --  140  ?K 5.8*  5.7* 4.7 4.3  ?CL 105  --  100  ?CO2 21*  --  31  ?GLUCOSE 104*  --  97  ?BUN 30*  --  30*  ?CREATININE 1.23  --  1.26*  ?CALCIUM 8.2*  --  8.3*  ?PROT 6.0*  --  5.7*  ?ALBUMIN  3.1*  --  2.9*  ?AST 268*  --  168*  ?ALT 448*  --  349*  ?ALKPHOS 112  --  104  ?BILITOT 1.6*  --  1.0  ?GFRNONAA >60  --  24*  ?ANIONGAP 10  --  9  ?  ? ?Hematology ?Recent Labs  ?Lab 05/17/21 ?1206 05/18/21 ?0340  ?WBC 12.6* 10.7*  ?RBC 4.18* 3.98*  ?HGB 12.7* 12.3*  ?HCT 40.6 37.3*  ?MCV 97.1 93.7  ?MCH 30.4 30.9  ?MCHC 31.3 33.0  ?RDW 15.8* 15.5  ?PLT 226 205  ? ? ?Cardiac EnzymesNo results for input(s): TROPONINI in the last 168 hours. No results for input(s): TROPIPOC in the last 168 hours.  ? ?BNP ?Recent Labs  ?Lab 05/17/21 ?1206  ?BNP 767.1*  ?  ? ?DDimer No results for input(s): DDIMER in the last 168 hours.  ? ?Radiology  ?  ?DG Chest Port 1 View ? ?Result Date: 05/17/2021 ?CLINICAL DATA:  -shortness of breath  for 1 month. EXAM: PORTABLE CHEST 1 VIEW COMPARISON:  04/19/2021 FINDINGS: 1221 hours. The cardio pericardial silhouette is enlarged. The lungs are clear without focal pneumonia, edema, pneumothorax or pleural effusion. Stable streaky opacity at the left base compatible with atelectasis or scarring. The visualized bony structures of the thorax are unremarkable. Telemetry leads overlie the chest. IMPRESSION: No active disease. Electronically Signed   By: Misty Stanley M.D.   On: 05/17/2021 12:40  ? ?US Abdomen Limited RUQ (LIVER/GB) ? ?Result Date: 05/17/2021 ?CLINICAL DATA:  76 year old male presents with abnormal liver function. EXAM: ULTRASOUND ABDOMEN LIMITED RIGHT UPPER QUADRANT COMPARISON:  Abdominal aortic evaluation from September 08, 2014. CT of the abdomen and pelvis from 2017. FINDINGS: Gallbladder: Evidence of cholelithiasis. Mild wall thickening. No reported tenderness over the gallbladder. Largest calculus 0.6 cm. Stones are located in the gallbladder neck. Common bile duct: Diameter: 3.2 mm Liver: Liver is poorly evaluated due to high position and sonographic window along with patient body habitus. No visible lesion on submitted images. Portal vein is patent on color Doppler imaging with  normal direction of blood flow towards the liver. Other: Signs of small volume perihepatic ascites and evidence of a RIGHT-sided pleural effusion. IMPRESSION: Nonspecific thickening of the gallbladder without pericholecystic fluid and without reported tenderness over the gallbladder during sonographic assessment. Findings are nonspecific and could be related to hepatic dysfunction or volume overload given the presence of ascites and RIGHT-sided pleural effusion better seen in the context of cholelithiasis with stones in the gallbladder neck. Correlate with laboratory values and physical examination to determine whether HIDA scan could be of benefit for added specificity. Electronically Signed   By: Zetta Bills M.D.   On: 05/17/2021 16:22   ? ?Cardiac Studies  ? ?NA ? ?Patient Profile  ?   ?76 y.o. male with chronic systolic CHF/NICM, permanent atrial fibrillation and prior atrial flutter, pulmonary HTN (felt combination WHO group 2 & 3), mild MR, mild-moderate TR, mild dilation of aortic root, hypertension, OSA, obesity, seizure, aortic atherosclerosis, COPD, hypertension, HLD, RBBB, QT prolongation who is being seen 05/17/2021 for the evaluation of shortness of breath, decompensated heart failure and AF RVR. ? ?Assessment & Plan  ?  ?Acute on chronic systolic CHF/NICM:    Net negative 3 liters.   Continue current IV diuresis.  We will need to consider out patient follow up and possible paramedicine program.   ? ?Permanent atrial fibrillation, here with RVR, also with hx of QT prolongation:  Rate OK.  Will follow.  Unable to increase beta blocker with lower BPs.    EP can follow as an out patient given previous discussion about AVN and CRT ? ?Hyperkalemia (K 5.7-5.8), mild AKI (BUN 30, Cr 1.23):  Hemolyzed sample.  Normal this AM.   ? ?Transaminitis:  Improved.   Continue to follow with diuresis.  GB ultrasound was essentially non specific.  I am not strongly suspect an acute GI issue and this is likely hepatic  congestion.  Will follow.  ? ?Bed bug infestation, petechial LE rash:  He reports getting this treated currently at home.   ? ?OSA:  Patient refuses CPAP.  He reports that he does not use this any longer.  ?- will continue to offer CPAP qhs ?  ?Seizure disorder :  Excellent note from pharmacy points to the fact that the patient is confused (though he denies this) about his meds.   Subtherapeutic phenobarbital.  Restarted on previous dose since we are not clear on adherence  at home.    ? ?Aortic atherosclerosis:  Holding statin with elevated liver enzymes.  ?  ?COPD:  No change in therapy.  ? ? ? ?For questions or updates, please contact [place] ?Please consult [email or web address] for contact info under Cardiology/STEMI. ?  ?Signed, ?Minus Breeding, MD  ?05/18/2021, 9:55 AM   ? ?

## 2021-05-18 NOTE — Plan of Care (Signed)
?  Problem: Activity: ?Goal: Capacity to carry out activities will improve ?Outcome: Progressing ?  ?Problem: Clinical Measurements: ?Goal: Respiratory complications will improve ?Outcome: Progressing ?  ?Problem: Elimination: ?Goal: Will not experience complications related to urinary retention ?Outcome: Progressing ?  ?Problem: Safety: ?Goal: Ability to remain free from injury will improve ?Outcome: Progressing ?  ?

## 2021-05-19 ENCOUNTER — Other Ambulatory Visit (HOSPITAL_COMMUNITY): Payer: Self-pay

## 2021-05-19 DIAGNOSIS — I5023 Acute on chronic systolic (congestive) heart failure: Secondary | ICD-10-CM | POA: Diagnosis not present

## 2021-05-19 LAB — PROTIME-INR
INR: 2.9 — ABNORMAL HIGH (ref 0.8–1.2)
Prothrombin Time: 30.5 seconds — ABNORMAL HIGH (ref 11.4–15.2)

## 2021-05-19 LAB — COMPREHENSIVE METABOLIC PANEL
ALT: 267 U/L — ABNORMAL HIGH (ref 0–44)
AST: 95 U/L — ABNORMAL HIGH (ref 15–41)
Albumin: 2.9 g/dL — ABNORMAL LOW (ref 3.5–5.0)
Alkaline Phosphatase: 104 U/L (ref 38–126)
Anion gap: 10 (ref 5–15)
BUN: 28 mg/dL — ABNORMAL HIGH (ref 8–23)
CO2: 32 mmol/L (ref 22–32)
Calcium: 8 mg/dL — ABNORMAL LOW (ref 8.9–10.3)
Chloride: 95 mmol/L — ABNORMAL LOW (ref 98–111)
Creatinine, Ser: 1.26 mg/dL — ABNORMAL HIGH (ref 0.61–1.24)
GFR, Estimated: 59 mL/min — ABNORMAL LOW (ref >60)
Glucose, Bld: 94 mg/dL (ref 70–99)
Potassium: 3.4 mmol/L — ABNORMAL LOW (ref 3.5–5.1)
Sodium: 137 mmol/L (ref 135–145)
Total Bilirubin: 1.1 mg/dL (ref 0.3–1.2)
Total Protein: 5.9 g/dL — ABNORMAL LOW (ref 6.5–8.1)

## 2021-05-19 LAB — CBC
HCT: 39.2 % (ref 39.0–52.0)
Hemoglobin: 12.7 g/dL — ABNORMAL LOW (ref 13.0–17.0)
MCH: 30.1 pg (ref 26.0–34.0)
MCHC: 32.4 g/dL (ref 30.0–36.0)
MCV: 92.9 fL (ref 80.0–100.0)
Platelets: 207 10*3/uL (ref 150–400)
RBC: 4.22 MIL/uL (ref 4.22–5.81)
RDW: 15.2 % (ref 11.5–15.5)
WBC: 10 10*3/uL (ref 4.0–10.5)
nRBC: 0 % (ref 0.0–0.2)

## 2021-05-19 LAB — MAGNESIUM: Magnesium: 1.6 mg/dL — ABNORMAL LOW (ref 1.7–2.4)

## 2021-05-19 MED ORDER — POTASSIUM CHLORIDE CRYS ER 20 MEQ PO TBCR
40.0000 meq | EXTENDED_RELEASE_TABLET | Freq: Once | ORAL | Status: AC
  Administered 2021-05-19: 40 meq via ORAL
  Filled 2021-05-19: qty 2

## 2021-05-19 MED ORDER — MAGNESIUM SULFATE 50 % IJ SOLN
3.0000 g | Freq: Once | INTRAVENOUS | Status: AC
  Administered 2021-05-19: 3 g via INTRAVENOUS
  Filled 2021-05-19: qty 6

## 2021-05-19 MED ORDER — TRIAMCINOLONE ACETONIDE 0.1 % EX CREA
1.0000 "application " | TOPICAL_CREAM | Freq: Two times a day (BID) | CUTANEOUS | Status: DC
  Administered 2021-05-19 – 2021-05-24 (×11): 1 via TOPICAL
  Filled 2021-05-19 (×3): qty 15

## 2021-05-19 MED ORDER — ESCITALOPRAM OXALATE 10 MG PO TABS: 20.0000 mg | ORAL_TABLET | Freq: Every day | ORAL | Status: DC

## 2021-05-19 NOTE — Progress Notes (Addendum)
Pharmacy clarified escitalopram last filled by mail order 03/2021 for #90 but remains unclear if he was actually taking this. Was not getting this in house last admission. Will f/u EKG today to reassess QTc to guide decision making for resumption.  ?Addendum: QT remains prolonged so will hold off on restarting. K was low, Dr. Percival Spanish supplementing. Will add Mg level. F/u EKG ordered in AM. ?Addendum: Mg 1.6, will replete and f/u level in AM. CrCl 73.6. ? ?

## 2021-05-19 NOTE — Progress Notes (Signed)
Mobility Specialist Progress Note: ? ? 05/19/21 1205  ?Mobility  ?Activity Ambulated with assistance in hallway  ?Level of Assistance Standby assist, set-up cues, supervision of patient - no hands on  ?Assistive Device Four wheel walker  ?Distance Ambulated (ft) 300 ft  ?Activity Response Tolerated well  ?$Mobility charge 1 Mobility  ? ?Pt eager for mobility session. Required minG during ambulation, no physical assist required. Pt back in chair with all needs met.  ? ?Nelta Numbers ?Acute Rehab ?Phone: [phone] ?Office Phone: [phone] ? ?

## 2021-05-19 NOTE — TOC Benefit Eligibility Note (Signed)
Patient Advocate Encounter ? ?Insurance verification completed.   ? ?The patient is currently admitted and upon discharge could be taking Farxiga 10 mg. ? ?The current 30 day co-pay is, $364.00 due to $265.00 deductible will be $99.00 once deductible is met.  ? ?The patient is currently admitted and upon discharge could be taking Jardiance 10 mg. ? ?The current 30 day co-pay is, $312.00 due to $265.00 deductible will be $47.00 once deductible is met. ? ?The patient is insured through [employer] Part D  ? ? ? ?Lyndel Safe, CPhT ?Pharmacy Patient Advocate Specialist ?[facility] Patient Advocate Team ?Direct Number: [phone]  Fax: [phone] ? ? ? ? ? ?  ?

## 2021-05-19 NOTE — Progress Notes (Addendum)
? ?Progress Note ? ?Patient Name: Ruben Murray ?Date of Encounter: 05/19/2021 ? ?Primary Cardiologist:   [name], MD ? ? ?Subjective  ? ?Breathing much better.  No chest pain.  No SOB.  Itching from his bed bug bites.  ? ?Inpatient Medications  ?  ?Scheduled Meds: ? digoxin  0.125 mg Oral Daily  ? finasteride  5 mg Oral Q1200  ? fluticasone  1 spray Each Nare BID  ? furosemide  80 mg Intravenous BID  ? loratadine  10 mg Oral Daily  ? metoprolol tartrate  50 mg Oral BID  ? phenobarbital  97.2 mg Oral QHS  ? sodium chloride flush  3 mL Intravenous Q12H  ? Warfarin - Pharmacist Dosing Inpatient   Does not apply Q7591  ? ?Continuous Infusions: ? sodium chloride    ? ?PRN Meds: ?sodium chloride, polyvinyl alcohol, sodium chloride flush  ? ?Vital Signs  ?  ?Vitals:  ? 05/19/21 0400 05/19/21 0500 05/19/21 0723 05/19/21 0735  ?BP:    109/76  ?Pulse: 100 97 92   ?Resp:   16   ?Temp:    98.1 ?F (36.7 ?C)  ?TempSrc:    Oral  ?SpO2:   98% 98%  ?Weight:  106.1 kg    ?Height:      ? ? ?Intake/Output Summary (Last 24 hours) at 05/19/2021 0758 ?Last data filed at 05/19/2021 0600 ?Gross per 24 hour  ?Intake 920 ml  ?Output 5425 ml  ?Net -4505 ml  ? ?Filed Weights  ? 05/17/21 2307 05/18/21 0500 05/19/21 0500  ?Weight: 110.1 kg 109.2 kg 106.1 kg  ? ? ?Telemetry  ?  ?Atrial fib with controlled ventricular rate - Personally Reviewed ? ?ECG  ?  ?NA - Personally Reviewed ? ?Physical Exam  ? ?GEN: No acute distress.   ?Neck: No  JVD ?Cardiac: Irregular RR, no murmurs, rubs, or gallops.  ?Respiratory: Clear to auscultation bilaterally. ?GI: Soft, nontender, non-distended  ?MS:     Moderate  edema; No deformity. ?Neuro:  Nonfocal  ?Psych: Normal affect  ? ?Labs  ?  ?Chemistry ?Recent Labs  ?Lab 05/17/21 ?1206 05/17/21 ?1631 05/18/21 ?6384 05/19/21 ?0406  ?NA 136  --  140 137  ?K 5.8*  5.7* 4.7 4.3 3.4*  ?CL 105  --  100 95*  ?CO2 21*  --  31 32  ?GLUCOSE 104*  --  97 94  ?BUN 30*  --  30* 28*  ?CREATININE 1.23  --  1.26* 1.26*   ?CALCIUM 8.2*  --  8.3* 8.0*  ?PROT 6.0*  --  5.7* 5.9*  ?ALBUMIN 3.1*  --  2.9* 2.9*  ?AST 268*  --  168* 95*  ?ALT 448*  --  349* 267*  ?ALKPHOS 112  --  104 104  ?BILITOT 1.6*  --  1.0 1.1  ?GFRNONAA >60  --  59* 59*  ?ANIONGAP 10  --  9 10  ?  ? ?Hematology ?Recent Labs  ?Lab 05/17/21 ?1206 05/18/21 ?6659 05/19/21 ?0406  ?WBC 12.6* 10.7* 10.0  ?RBC 4.18* 3.98* 4.22  ?HGB 12.7* 12.3* 12.7*  ?HCT 40.6 37.3* 39.2  ?MCV 97.1 93.7 92.9  ?MCH 30.4 30.9 30.1  ?MCHC 31.3 33.0 32.4  ?RDW 15.8* 15.5 15.2  ?PLT 226 205 207  ? ? ?Cardiac EnzymesNo results for input(s): TROPONINI in the last 168 hours. No results for input(s): TROPIPOC in the last 168 hours.  ? ?BNP ?Recent Labs  ?Lab 05/17/21 ?1206  ?BNP 767.1*  ?  ? ?DDimer  No results for input(s): DDIMER in the last 168 hours.  ? ?Radiology  ?  ?DG Chest Port 1 View ? ?Result Date: 05/17/2021 ?CLINICAL DATA:  -shortness of breath for 1 month. EXAM: PORTABLE CHEST 1 VIEW COMPARISON:  04/19/2021 FINDINGS: 1221 hours. The cardio pericardial silhouette is enlarged. The lungs are clear without focal pneumonia, edema, pneumothorax or pleural effusion. Stable streaky opacity at the left base compatible with atelectasis or scarring. The visualized bony structures of the thorax are unremarkable. Telemetry leads overlie the chest. IMPRESSION: No active disease. Electronically Signed   By: Misty Stanley M.D.   On: 05/17/2021 12:40  ? ?US Abdomen Limited RUQ (LIVER/GB) ? ?Result Date: 05/17/2021 ?CLINICAL DATA:  76 year old male presents with abnormal liver function. EXAM: ULTRASOUND ABDOMEN LIMITED RIGHT UPPER QUADRANT COMPARISON:  Abdominal aortic evaluation from September 08, 2014. CT of the abdomen and pelvis from 2017. FINDINGS: Gallbladder: Evidence of cholelithiasis. Mild wall thickening. No reported tenderness over the gallbladder. Largest calculus 0.6 cm. Stones are located in the gallbladder neck. Common bile duct: Diameter: 3.2 mm Liver: Liver is poorly evaluated due to high  position and sonographic window along with patient body habitus. No visible lesion on submitted images. Portal vein is patent on color Doppler imaging with normal direction of blood flow towards the liver. Other: Signs of small volume perihepatic ascites and evidence of a RIGHT-sided pleural effusion. IMPRESSION: Nonspecific thickening of the gallbladder without pericholecystic fluid and without reported tenderness over the gallbladder during sonographic assessment. Findings are nonspecific and could be related to hepatic dysfunction or volume overload given the presence of ascites and RIGHT-sided pleural effusion better seen in the context of cholelithiasis with stones in the gallbladder neck. Correlate with laboratory values and physical examination to determine whether HIDA scan could be of benefit for added specificity. Electronically Signed   By: Zetta Bills M.D.   On: 05/17/2021 16:22   ? ?Cardiac Studies  ? ?Echo ? ?1. Left ventricular ejection fraction, by estimation, is 20 to 25%. The  ?left ventricle has severely decreased function. The left ventricle  ?demonstrates global hypokinesis. The left ventricular internal cavity size  ?was moderately dilated. Left  ?ventricular diastolic parameters are indeterminate.  ? 2. Right ventricular systolic function is mildly reduced. The right  ?ventricular size is mildly enlarged. There is moderately elevated  ?pulmonary artery systolic pressure. The estimated right ventricular  ?systolic pressure is 63.0 mmHg.  ? 3. Left atrial size was moderately dilated.  ? 4. Right atrial size was moderately dilated.  ? 5. The mitral valve is normal in structure. Mild mitral valve  ?regurgitation. No evidence of mitral stenosis.  ? 6. Tricuspid valve regurgitation is mild to moderate.  ? 7. The aortic valve is tricuspid. Aortic valve regurgitation is mild. No  ?aortic stenosis is present.  ? 8. Aortic dilatation noted. There is mild dilatation of the aortic root,  ?measuring 39  mm.  ? 9. The inferior vena cava is dilated in size with <50% respiratory  ?variability, suggesting right atrial pressure of 15 mmHg.  ? ?Patient Profile  ?   ?76 y.o. male with chronic systolic CHF/NICM, permanent atrial fibrillation and prior atrial flutter, pulmonary HTN (felt combination WHO group 2 & 3), mild MR, mild-moderate TR, mild dilation of aortic root, hypertension, OSA, obesity, seizure, aortic atherosclerosis, COPD, hypertension, HLD, RBBB, QT prolongation who is being seen 05/17/2021 for the evaluation of shortness of breath, decompensated heart failure and AF RVR. ? ?Assessment & Plan  ?  ?  ?  Acute on chronic systolic CHF/NICM:    Net negative 7.4 liters.   Cotninue IV therapy.  We will need to consider out patient follow up and possible paramedicine program.   Difficult to titrate any meds.  He was supposed to be on 25 of Cozaar at discharge.  He was supposed to be on Farxiga and Aldactone 12.5 mg.  Today I will hold these meds again and continue beta blocker for rate control.  ? ?CKD II:  Creat is stable.  Follow  ?  ?Permanent atrial fibrillation, here with RVR:    Rate OK.  No change in beta blocker today.   EP can follow as an out patient given previous discussion about AVN and CRT ?  ?Hyperkalemia (K 5.7-5.8):    Low today.   Hypokalemia.  Supplemented.  ?  ?Transaminitis:   Continues to improve.    ?  ?OSA:  Patient refuses CPAP.   ?  ?Seizure disorder :  See pharmacy note.  He is on previous dose of phenobarbital.    ?  ?Aortic atherosclerosis:  Holding statin with elevated liver enzymes.  ?  ?COPD:   Continue current therapy.   ? ?For questions or updates, please contact [place] ?Please consult [email or web address] for contact info under Cardiology/STEMI. ?  ?Signed, ?Minus Breeding, MD  ?05/19/2021, 7:58 AM   ? ?

## 2021-05-19 NOTE — Plan of Care (Signed)
?  Problem: Education: Goal: Ability to demonstrate management of disease process will improve Outcome: Progressing Goal: Ability to verbalize understanding of medication therapies will improve Outcome: Progressing Goal: Individualized Educational Video(s) Outcome: Progressing   Problem: Activity: Goal: Capacity to carry out activities will improve Outcome: Progressing   Problem: Cardiac: Goal: Ability to achieve and maintain adequate cardiopulmonary perfusion will improve Outcome: Progressing   Problem: Education: Goal: Knowledge of General Education information will improve Description: Including pain rating scale, medication(s)/side effects and non-pharmacologic comfort measures Outcome: Progressing   

## 2021-05-19 NOTE — Progress Notes (Addendum)
Heart Failure Stewardship Pharmacist Progress Note ? ? ?PCP: [name], MD ?PCP-Cardiologist: [name], MD  ? ? ?HPI:  ?76 yo M with PMH of CHF, afib, pulm HTN, mild MR, mild-mod TR, mild dilation of aortic root, HTN, OSA, obesity, seizures, aortic atherosclerosis, COPD, HLD, and QT prolongation. He presented to the ED on 3/14 with shortness of breath and weakness. He had an appt with HF TOC clinic that day but was short of breath on the phone and then called EMS.  ? ?Notably was not taking PTA meds as directed (picked up rx's for torsemide and spironolactone 10 days after dose changed; did not pick up digoxin or Jardiance).  ? ?CXR negative for active disease. Last ECHO from 2/15 LVEF 20-25% (unchanged from prior - has fluctuated from 20-25% and 40-45% since 2018) and mildly reduced RV. ? ?Current HF Medications: ?Diuretic: furosemide 80 mg IV BID ?Beta Blocker: metoprolol tartrate 50 mg BID ?Other: digoxin 0.125 mg daily ? ?Prior to admission HF Medications: ?Diuretic: torsemide 40 mg daily ?Beta blocker: metoprolol XL 100 mg daily ?ACE/ARB/ARNI: losartan 25 mg daily ?Aldosterone Antagonist: spironolactone 25 mg daily ? ?Pertinent Lab Values: ?Serum creatinine 1.26, BUN 28, Potassium 3.4, Sodium 137, BNP 767, Magnesium 1.6, Digoxin <0.2  ? ?Vital Signs: ?Weight: 234 lbs (admission weight: 242 lbs) ?Blood pressure: 110/70s  ?Heart rate: 100s  ?I/O: -4.8L yesterday; net -8.3L ? ?Medication Assistance / Insurance Benefits Check: ?Does the patient have prescription insurance?  Yes ?Type of insurance plan: [name] ? ?Outpatient Pharmacy:  ?Prior to admission outpatient [facility] ?Is the patient willing to use [facility] at discharge? Yes ?Is the patient willing to transition their outpatient pharmacy to utilize a [facility] outpatient pharmacy?   Pending ?  ? ?Assessment: ?1. Acute on chronic systolic CHF (EF 62-44%), due to NICM. NYHA class III symptoms. ?- Continue furosemide 80 mg IV BID. K  and mag being replaced. ?- On metoprolol tartrate 50 mg BID (metoprolol XL 100 mg PTA) - consider transitioning back to XL prior to discharge ?- Consider restarting losartan, spironolactone, and Jardiance prior to discharge ?- Continue digoxin 0.125 mg daily - last level 3/14 <0.2. Recommend rechecking level since compliance was questioned. Level due early next week. ?  ?Plan: ?1) Medication changes recommended at this time: ?- Start spironolactone 25 mg daily tomorrow if SCr stable/improved ? ?2) Patient assistance: ?- $265 deductible remaining on insurance ?- Copay for [facility] [place] is $99 and Vania Rea is $47 once deductible met ? ?3)  Education  ?- To be completed prior to discharge ? ?Kerby Nora, PharmD, BCPS ?Heart Failure Stewardship Pharmacist ?Phone [phone] ? ? ?

## 2021-05-19 NOTE — TOC Progression Note (Signed)
Transition of Care (TOC) - Progression Note  ? ? ?Patient Details  ?Name: Ruben Murray ?MRN: [id number] ?Date of Birth: 02-05-1946 ? ?Transition of Care (TOC) CM/SW Contact  ?Alexsandro Salek Renold Don, LCSWA ?Phone Number: ?05/19/2021, 9:54 AM ? ?Clinical Narrative:    ? ?Transition of Care (TOC) Screening Note ? ? ?Patient Details  ?Name: Ruben Murray ?Date of Birth: 04/29/1945 ? ? ?Transition of Care (TOC) CM/SW Contact:    ?Reece Agar, LCSWA ?Phone Number: ?05/19/2021, 9:55 AM ? ? ? ?Transition of Care Department [facility]) has reviewed patient and no TOC needs have been identified at this time. We will continue to monitor patient advancement through interdisciplinary progression rounds. If new patient transition needs arise, please place a TOC consult. ?  ? ? ?  ?  ? ?Expected Discharge Plan and Services ?  ?  ?  ?  ?  ?                ?  ?  ?  ?  ?  ?  ?  ?  ?  ?  ? ? ?Social Determinants of Health (SDOH) Interventions ?  ? ?Readmission Risk Interventions ?Readmission Risk Prevention Plan 12/11/2019  ?Transportation Screening Complete  ?PCP or Specialist Appt within 5-7 Days Complete  ?Home Care Screening Complete  ?Medication Review (RN CM) Complete  ?Some recent data might be hidden  ? ? ?

## 2021-05-19 NOTE — Progress Notes (Signed)
ANTICOAGULATION CONSULT NOTE ? ?Pharmacy Consult for Warfarin ?Indication: atrial fibrillation ? ?Allergies  ?Allergen Reactions  ? Dilaudid [Hydromorphone Hcl] Other (See Comments)  ?  Makes pt hyper   ? Hydromorphone Other (See Comments)  ?  hyperactiviity ?Other reaction(s): made him wild  ? Morphine Sulfate   ?  Other reaction(s): at high dose causes confusion  ? Tegretol [Carbamazepine] Hives  ? Carbamazepine Hives and Rash  ?  Other reaction(s): hives  ? ? ?Patient Measurements: ?Height: 5' 8" (172.7 cm) ?Weight: 106.1 kg (234 lb) ?IBW/kg (Calculated) : 68.4 ? ? ?Vital Signs: ?Temp: 98.1 ?F (36.7 ?C) (03/16 0735) ?Temp Source: Oral (03/16 0735) ?BP: 109/76 (03/16 0735) ?Pulse Rate: 92 (03/16 0723) ? ?Labs: ?Recent Labs  ?  05/17/21 ?1206 05/18/21 ?0802 05/19/21 ?0406  ?HGB 12.7* 12.3* 12.7*  ?HCT 40.6 37.3* 39.2  ?PLT 226 205 207  ?LABPROT 41.7* 39.5* 30.5*  ?INR 4.4* 4.1* 2.9*  ?CREATININE 1.23 1.26* 1.26*  ? ? ?Estimated Creatinine Clearance: 58 mL/min (A) (by C-G formula based on SCr of 1.26 mg/dL (H)). ? ? ?PTA Regimen per patient:  ?Warfarin 5 mg daily ?Last clinic INR per patient: 2.5 ?Unable to read last clinic note with [facility] ? ?Assessment: ?76 y.o. male with medical history significant for permanent atrial fibrillation and prior atrial flutter who presented with shortness of breath, decompensated heart failure and AF RVR. Pharmacy consulted to manage warfarin. ? ?INR elevated at 4.4 upon admission. Warfarin dose held 3/14 and 3/15. INR now at higher end of therapeutic range at 2.9. Still has resolving AKI. Will hold one more dose of warfarin tonight and resume PTA regimen 3/17.  ? ? ?Goal of Therapy:  ?INR 2-3 ?Monitor platelets by anticoagulation protocol: Yes ?  ?Plan:  ?Hold warfarin x1 dose ?Check INR daily while on warfarin ?Continue to monitor H&H and platelets ? ? ? ?Thank you for allowing pharmacy to be a part of this patient?s care. ? ?Ardyth Harps, PharmD ?Clinical  Pharmacist ?

## 2021-05-20 DIAGNOSIS — I5023 Acute on chronic systolic (congestive) heart failure: Secondary | ICD-10-CM | POA: Diagnosis not present

## 2021-05-20 LAB — COMPREHENSIVE METABOLIC PANEL
ALT: 211 U/L — ABNORMAL HIGH (ref 0–44)
AST: 60 U/L — ABNORMAL HIGH (ref 15–41)
Albumin: 3 g/dL — ABNORMAL LOW (ref 3.5–5.0)
Alkaline Phosphatase: 105 U/L (ref 38–126)
Anion gap: 8 (ref 5–15)
BUN: 22 mg/dL (ref 8–23)
CO2: 36 mmol/L — ABNORMAL HIGH (ref 22–32)
Calcium: 8.1 mg/dL — ABNORMAL LOW (ref 8.9–10.3)
Chloride: 94 mmol/L — ABNORMAL LOW (ref 98–111)
Creatinine, Ser: 1.14 mg/dL (ref 0.61–1.24)
GFR, Estimated: >60 mL/min (ref >60)
Glucose, Bld: 96 mg/dL (ref 70–99)
Potassium: 3.8 mmol/L (ref 3.5–5.1)
Sodium: 138 mmol/L (ref 135–145)
Total Bilirubin: 1 mg/dL (ref 0.3–1.2)
Total Protein: 6 g/dL — ABNORMAL LOW (ref 6.5–8.1)

## 2021-05-20 LAB — CBC
HCT: 40 % (ref 39.0–52.0)
Hemoglobin: 13 g/dL (ref 13.0–17.0)
MCH: 30 pg (ref 26.0–34.0)
MCHC: 32.5 g/dL (ref 30.0–36.0)
MCV: 92.2 fL (ref 80.0–100.0)
Platelets: 212 10*3/uL (ref 150–400)
RBC: 4.34 MIL/uL (ref 4.22–5.81)
RDW: 15.1 % (ref 11.5–15.5)
WBC: 10 10*3/uL (ref 4.0–10.5)
nRBC: 0 % (ref 0.0–0.2)

## 2021-05-20 LAB — HEPATIC FUNCTION PANEL
ALT: 190 U/L — ABNORMAL HIGH (ref 0–44)
AST: 53 U/L — ABNORMAL HIGH (ref 15–41)
Albumin: 3.1 g/dL — ABNORMAL LOW (ref 3.5–5.0)
Alkaline Phosphatase: 118 U/L (ref 38–126)
Bilirubin, Direct: 0.4 mg/dL — ABNORMAL HIGH (ref 0.0–0.2)
Indirect Bilirubin: 0.8 mg/dL (ref 0.3–0.9)
Total Bilirubin: 1.2 mg/dL (ref 0.3–1.2)
Total Protein: 6.5 g/dL (ref 6.5–8.1)

## 2021-05-20 LAB — PROTIME-INR
INR: 2.1 — ABNORMAL HIGH (ref 0.8–1.2)
Prothrombin Time: 23.2 seconds — ABNORMAL HIGH (ref 11.4–15.2)

## 2021-05-20 LAB — MAGNESIUM: Magnesium: 2 mg/dL (ref 1.7–2.4)

## 2021-05-20 MED ORDER — WARFARIN SODIUM 5 MG PO TABS
5.0000 mg | ORAL_TABLET | Freq: Once | ORAL | Status: AC
  Administered 2021-05-20: 5 mg via ORAL
  Filled 2021-05-20: qty 1

## 2021-05-20 MED ORDER — DIGOXIN 125 MCG PO TABS
0.2500 mg | ORAL_TABLET | Freq: Every day | ORAL | Status: DC
Start: 2021-05-21 — End: 2021-05-20

## 2021-05-20 MED ORDER — DIGOXIN 125 MCG PO TABS
0.2500 mg | ORAL_TABLET | Freq: Every day | ORAL | Status: DC
Start: 2021-05-20 — End: 2021-05-24
  Administered 2021-05-20 – 2021-05-24 (×5): 0.25 mg via ORAL
  Filled 2021-05-20 (×5): qty 2

## 2021-05-20 NOTE — Progress Notes (Signed)
ANTICOAGULATION CONSULT NOTE ? ?Pharmacy Consult for Warfarin ?Indication: atrial fibrillation ? ?Allergies  ?Allergen Reactions  ? Dilaudid [Hydromorphone Hcl] Other (See Comments)  ?  Makes pt hyper   ? Hydromorphone Other (See Comments)  ?  hyperactiviity ?Other reaction(s): made him wild  ? Morphine Sulfate   ?  Other reaction(s): at high dose causes confusion  ? Tegretol [Carbamazepine] Hives  ? Carbamazepine Hives and Rash  ?  Other reaction(s): hives  ? ? ?Patient Measurements: ?Height: _0  (172.7 cm) ?Weight: 103.2 kg (227 lb 8 oz) ?IBW/kg (Calculated) : 68.4 ? ? ?Vital Signs: ?Temp: 97.8 ?F (36.6 ?C) (03/17 0719) ?Temp Source: Oral (03/17 0719) ?BP: 103/72 (03/17 0719) ?Pulse Rate: 97 (03/17 0719) ? ?Labs: ?Recent Labs  ?  05/18/21 ?2182 05/19/21 ?0406 05/20/21 ?0359  ?HGB 12.3* 12.7* 13.0  ?HCT 37.3* 39.2 40.0  ?PLT 205 207 212  ?LABPROT 39.5* 30.5* 23.2*  ?INR 4.1* 2.9* 2.1*  ?CREATININE 1.26* 1.26* 1.14  ? ? ?Estimated Creatinine Clearance: 63.2 mL/min (by C-G formula based on SCr of 1.14 mg/dL). ? ? ?PTA Regimen per patient:  ?Warfarin 5 mg daily ?Last clinic INR per patient: 2.5 ?Unable to read last clinic note with [facility] ? ?Assessment: ?76 y.o. male with medical history significant for permanent atrial fibrillation and prior atrial flutter who presented with shortness of breath, decompensated heart failure and AF RVR. Pharmacy consulted to manage warfarin. ? ?INR elevated at 4.4 upon admission. Warfarin dose held 3/14-3/16. INR now at lower end of therapeutic range at 2.1. Still has resolving AKI. CBC stable. Will resume PTA warfarin regimen.  ? ? ?Goal of Therapy:  ?INR 2-3 ?Monitor platelets by anticoagulation protocol: Yes ?  ?Plan:  ?Give warfarin 36m PO x1 dose ?Check INR daily while on warfarin ?Continue to monitor H&H and platelets ? ? ? ?Thank you for allowing pharmacy to be a part of this patient?s care. ? ?AArdyth Harps PharmD ?Clinical Pharmacist ?

## 2021-05-20 NOTE — Plan of Care (Signed)
?  Problem: Education: Goal: Ability to demonstrate management of disease process will improve Outcome: Progressing Goal: Ability to verbalize understanding of medication therapies will improve Outcome: Progressing Goal: Individualized Educational Video(s) Outcome: Progressing   Problem: Activity: Goal: Capacity to carry out activities will improve Outcome: Progressing   Problem: Cardiac: Goal: Ability to achieve and maintain adequate cardiopulmonary perfusion will improve Outcome: Progressing   

## 2021-05-20 NOTE — Progress Notes (Signed)
? ?Progress Note ? ?Patient Name: Ruben Murray ?Date of Encounter: 05/20/2021 ? ?Primary Cardiologist:   [name], MD ? ? ?Subjective  ? ?He feels better.  Perhaps not at baseline.  Ambulated in the hallway.   ? ?Inpatient Medications  ?  ?Scheduled Meds: ? digoxin  0.125 mg Oral Daily  ? finasteride  5 mg Oral Q1200  ? fluticasone  1 spray Each Nare BID  ? furosemide  80 mg Intravenous BID  ? loratadine  10 mg Oral Daily  ? metoprolol tartrate  50 mg Oral BID  ? phenobarbital  97.2 mg Oral QHS  ? sodium chloride flush  3 mL Intravenous Q12H  ? triamcinolone cream  1 application. Topical BID  ? warfarin  5 mg Oral ONCE-1600  ? Warfarin - Pharmacist Dosing Inpatient   Does not apply Q4210  ? ?Continuous Infusions: ? sodium chloride    ? ?PRN Meds: ?sodium chloride, polyvinyl alcohol, sodium chloride flush  ? ?Vital Signs  ?  ?Vitals:  ? 05/19/21 2045 05/20/21 0017 05/20/21 0423 05/20/21 0719  ?BP: 94/73 98/65 102/70 103/72  ?Pulse: 99 97 69 97  ?Resp: _0 ?Temp: (!) 97.3 ?F (36.3 ?C) 97.6 ?F (36.4 ?C) (!) 97.4 ?F (36.3 ?C) 97.8 ?F (36.6 ?C)  ?TempSrc: Oral Oral Oral Oral  ?SpO2: 100% 94% 99% 98%  ?Weight:   103.2 kg   ?Height:      ? ? ?Intake/Output Summary (Last 24 hours) at 05/20/2021 0800 ?Last data filed at 05/20/2021 0426 ?Gross per 24 hour  ?Intake 929.01 ml  ?Output 4850 ml  ?Net -3920.99 ml  ? ?Filed Weights  ? 05/18/21 0500 05/19/21 0500 05/20/21 0423  ?Weight: 109.2 kg 106.1 kg 103.2 kg  ? ? ?Telemetry  ?  ?Atrial fib with RVR- Personally Reviewed ? ?ECG  ?  ?NA - Personally Reviewed ? ?Physical Exam  ? ?GEN: No  acute distress.   ?Neck: No  JVD ?Cardiac: Irregular RR, no murmurs, rubs, or gallops.  ?Respiratory: Clear  to auscultation bilaterally. ?GI: Soft, nontender, non-distended, normal bowel sounds  ?MS:  Moderately severe edema; No deformity. ?Neuro:   Nonfocal  ?Psych: Oriented and appropriate  ? ? ?Labs  ?  ?Chemistry ?Recent Labs  ?Lab 05/18/21 ?3128 05/19/21 ?0406 05/20/21 ?0359   ?NA 140 137 138  ?K 4.3 3.4* 3.8  ?CL 100 95* 94*  ?CO2 31 32 36*  ?GLUCOSE 97 94 96  ?BUN 30* 28* 22  ?CREATININE 1.26* 1.26* 1.14  ?CALCIUM 8.3* 8.0* 8.1*  ?PROT 5.7* 5.9* 6.0*  ?ALBUMIN 2.9* 2.9* 3.0*  ?AST 168* 95* 60*  ?ALT 349* 267* 211*  ?ALKPHOS 104 104 105  ?BILITOT 1.0 1.1 1.0  ?GFRNONAA 59* 59* >60  ?ANIONGAP _1 ?  ? ?Hematology ?Recent Labs  ?Lab 05/18/21 ?1188 05/19/21 ?0406 05/20/21 ?0359  ?WBC 10.7* 10.0 10.0  ?RBC 3.98* 4.22 4.34  ?HGB 12.3* 12.7* 13.0  ?HCT 37.3* 39.2 40.0  ?MCV 93.7 92.9 92.2  ?MCH 30.9 30.1 30.0  ?MCHC 33.0 32.4 32.5  ?RDW 15.5 15.2 15.1  ?PLT 205 207 212  ? ? ?Cardiac EnzymesNo results for input(s): TROPONINI in the last 168 hours. No results for input(s): TROPIPOC in the last 168 hours.  ? ?BNP ?Recent Labs  ?Lab 05/17/21 ?1206  ?BNP 767.1*  ?  ? ?DDimer No results for input(s): DDIMER in the last 168 hours.  ? ?Radiology  ?  ?No results found. ? ?Cardiac Studies  ? ?  Echo ? ?1. Left ventricular ejection fraction, by estimation, is 20 to 25%. The  ?left ventricle has severely decreased function. The left ventricle  ?demonstrates global hypokinesis. The left ventricular internal cavity size  ?was moderately dilated. Left  ?ventricular diastolic parameters are indeterminate.  ? 2. Right ventricular systolic function is mildly reduced. The right  ?ventricular size is mildly enlarged. There is moderately elevated  ?pulmonary artery systolic pressure. The estimated right ventricular  ?systolic pressure is 19.5 mmHg.  ? 3. Left atrial size was moderately dilated.  ? 4. Right atrial size was moderately dilated.  ? 5. The mitral valve is normal in structure. Mild mitral valve  ?regurgitation. No evidence of mitral stenosis.  ? 6. Tricuspid valve regurgitation is mild to moderate.  ? 7. The aortic valve is tricuspid. Aortic valve regurgitation is mild. No  ?aortic stenosis is present.  ? 8. Aortic dilatation noted. There is mild dilatation of the aortic root,  ?measuring 39 mm.  ?  9. The inferior vena cava is dilated in size with <50% respiratory  ?variability, suggesting right atrial pressure of 15 mmHg.  ? ?Patient Profile  ?   ?76 y.o. male with chronic systolic CHF/NICM, permanent atrial fibrillation and prior atrial flutter, pulmonary HTN (felt combination WHO group 2 & 3), mild MR, mild-moderate TR, mild dilation of aortic root, hypertension, OSA, obesity, seizure, aortic atherosclerosis, COPD, hypertension, HLD, RBBB, QT prolongation who is being seen 05/17/2021 for the evaluation of shortness of breath, decompensated heart failure and AF RVR. ? ?Assessment & Plan  ?  ?  ?Acute on chronic systolic CHF/NICM:    Net negative 11.28 liters.   Cotninue IV diuresis.   ? ?CKD II:  Creat is down.  Follow  ?  ?Permanent atrial fibrillation, here with RVR:    Rate has been mildly elevated and increased with ambulation. .  INR therapeutic.    No change in beta blocker today.   EP can follow as an out patient given previous discussion about AVN and CRT .  I will increase the digoxin ?  ?Transaminitis:   Continues to improve ?  ?OSA:  Patient refuses CPAP.   ?  ?Aortic atherosclerosis:  Resume statins when liver enzymes normalize.  ?  ?COPD:   Continue current therapy.   ? ?For questions or updates, please contact [facility] ?Please consult [email or web address] for contact info under Cardiology/STEMI. ?  ?Signed, ?Minus Breeding, MD  ?05/20/2021, 8:00 AM   ? ?

## 2021-05-20 NOTE — Progress Notes (Signed)
Mobility Specialist Progress Note: ? ? 05/20/21 1140  ?Mobility  ?Activity Ambulated with assistance in hallway  ?Level of Assistance Standby assist, set-up cues, supervision of patient - no hands on  ?Assistive Device Four wheel walker  ?Distance Ambulated (ft) 300 ft  ?Activity Response Tolerated well  ?$Mobility charge 1 Mobility  ? ?Pt received in BR, agreeable to mobility session. Required minG throughout for safety, no physical assistance required. HR elevated to 180s upon exertion, requiring seated rest break to recover. Pt back in chair with all needs met.  ? ?Nelta Numbers ?Acute Rehab ?Phone: [phone] ?Office Phone: [phone] ? ?

## 2021-05-20 NOTE — Care Management Important Message (Signed)
Important Message ? ?Patient Details  ?Name: Ruben Murray ?MRN: [id number] ?Date of Birth: 1945-03-20 ? ? ?Medicare Important Message Given:  Yes ? ? ? ? ?[name] ?05/20/2021, 7:51 AM ?

## 2021-05-21 DIAGNOSIS — I5023 Acute on chronic systolic (congestive) heart failure: Secondary | ICD-10-CM | POA: Diagnosis not present

## 2021-05-21 LAB — COMPREHENSIVE METABOLIC PANEL
ALT: 155 U/L — ABNORMAL HIGH (ref 0–44)
AST: 50 U/L — ABNORMAL HIGH (ref 15–41)
Albumin: 3 g/dL — ABNORMAL LOW (ref 3.5–5.0)
Alkaline Phosphatase: 108 U/L (ref 38–126)
Anion gap: 12 (ref 5–15)
BUN: 23 mg/dL (ref 8–23)
CO2: 28 mmol/L (ref 22–32)
Calcium: 7.8 mg/dL — ABNORMAL LOW (ref 8.9–10.3)
Chloride: 95 mmol/L — ABNORMAL LOW (ref 98–111)
Creatinine, Ser: 1.13 mg/dL (ref 0.61–1.24)
GFR, Estimated: >60 mL/min (ref >60)
Glucose, Bld: 82 mg/dL (ref 70–99)
Potassium: 3.6 mmol/L (ref 3.5–5.1)
Sodium: 135 mmol/L (ref 135–145)
Total Bilirubin: 0.8 mg/dL (ref 0.3–1.2)
Total Protein: 5.7 g/dL — ABNORMAL LOW (ref 6.5–8.1)

## 2021-05-21 LAB — CBC
HCT: 39.3 % (ref 39.0–52.0)
Hemoglobin: 12.9 g/dL — ABNORMAL LOW (ref 13.0–17.0)
MCH: 30.1 pg (ref 26.0–34.0)
MCHC: 32.8 g/dL (ref 30.0–36.0)
MCV: 91.8 fL (ref 80.0–100.0)
Platelets: 196 10*3/uL (ref 150–400)
RBC: 4.28 MIL/uL (ref 4.22–5.81)
RDW: 15.1 % (ref 11.5–15.5)
WBC: 9 10*3/uL (ref 4.0–10.5)
nRBC: 0 % (ref 0.0–0.2)

## 2021-05-21 LAB — PHENOBARBITAL LEVEL: Phenobarbital: 15.7 ug/mL (ref 15.0–30.0)

## 2021-05-21 LAB — PROTIME-INR
INR: 1.7 — ABNORMAL HIGH (ref 0.8–1.2)
Prothrombin Time: 19.6 seconds — ABNORMAL HIGH (ref 11.4–15.2)

## 2021-05-21 MED ORDER — PREDNISONE 10 MG PO TABS
10.0000 mg | ORAL_TABLET | Freq: Once | ORAL | Status: AC
Start: 2021-05-21 — End: 2021-05-21
  Administered 2021-05-21: 10 mg via ORAL
  Filled 2021-05-21: qty 1

## 2021-05-21 MED ORDER — WARFARIN SODIUM 7.5 MG PO TABS
7.5000 mg | ORAL_TABLET | Freq: Once | ORAL | Status: AC
  Administered 2021-05-21: 7.5 mg via ORAL
  Filled 2021-05-21: qty 1

## 2021-05-21 MED ORDER — WARFARIN SODIUM 3 MG PO TABS: 6.0000 mg | ORAL_TABLET | Freq: Once | ORAL | Status: DC

## 2021-05-21 NOTE — Progress Notes (Addendum)
? ?Progress Note ? ?Patient Name: Ruben Murray ?Date of Encounter: 05/21/2021 ? ?Primary Cardiologist:   [name], MD ? ? ?Subjective  ? ?He thinks he is breathing back to baseline.   His foot feels much better.   ? ?Inpatient Medications  ?  ?Scheduled Meds: ? digoxin  0.25 mg Oral Daily  ? finasteride  5 mg Oral Q1200  ? fluticasone  1 spray Each Nare BID  ? furosemide  80 mg Intravenous BID  ? loratadine  10 mg Oral Daily  ? metoprolol tartrate  50 mg Oral BID  ? phenobarbital  97.2 mg Oral QHS  ? sodium chloride flush  3 mL Intravenous Q12H  ? triamcinolone cream  1 application. Topical BID  ? warfarin  7.5 mg Oral ONCE-1600  ? Warfarin - Pharmacist Dosing Inpatient   Does not apply [id number]  ? ?Continuous Infusions: ? sodium chloride    ? ?PRN Meds: ?sodium chloride, polyvinyl alcohol, sodium chloride flush  ? ?Vital Signs  ?  ?Vitals:  ? 05/21/21 0749 05/21/21 0910 05/21/21 0914 05/21/21 1015  ?BP: 103/69 108/67 107/62 100/70  ?Pulse: 90     ?Resp: 19   20  ?Temp: (!) 97.5 ?F (36.4 ?C)  98.1 ?F (36.7 ?C) 97.8 ?F (36.6 ?C)  ?TempSrc: Oral  Oral Oral  ?SpO2: 100%     ?Weight:      ?Height:      ? ? ?Intake/Output Summary (Last 24 hours) at 05/21/2021 1125 ?Last data filed at 05/21/2021 1000 ?Gross per 24 hour  ?Intake 1180 ml  ?Output 4400 ml  ?Net -3220 ml  ? ?Filed Weights  ? 05/19/21 0500 05/20/21 0423 05/21/21 0357  ?Weight: 106.1 kg 103.2 kg 100.7 kg  ? ? ?Telemetry  ?  ?Atrial fib.  Rate relatively controlled.   Personally Reviewed ? ?ECG  ?  ?NA - Personally Reviewed ? ?Physical Exam  ? ?GEN: No  acute distress.   ?Neck: No  JVD ?Cardiac: Irregular, no murmurs, rubs, or gallops.  ?Respiratory: Clear  to auscultation bilaterally. ?GI: Soft, nontender, non-distended, normal bowel sounds  ?MS:  Moderate edema; No deformity. ?Neuro:   Nonfocal  ?Psych: Oriented and appropriate  ? ? ?Labs  ?  ?Chemistry ?Recent Labs  ?Lab 05/18/21 ?0340 05/19/21 ?0406 05/20/21 ?0359 05/20/21 ?2051  ?NA 140 137 138  --   ?K  4.3 3.4* 3.8  --   ?CL 100 95* 94*  --   ?CO2 31 32 36*  --   ?GLUCOSE 97 94 96  --   ?BUN 30* 28* 22  --   ?CREATININE 1.26* 1.26* 1.14  --   ?CALCIUM 8.3* 8.0* 8.1*  --   ?PROT 5.7* 5.9* 6.0* 6.5  ?ALBUMIN 2.9* 2.9* 3.0* 3.1*  ?AST 168* 95* 60* 53*  ?ALT 349* 267* 211* 190*  ?ALKPHOS 104 104 105 118  ?BILITOT 1.0 1.1 1.0 1.2  ?GFRNONAA 59* 59* >60  --   ?ANIONGAP _0 --   ?  ? ?Hematology ?Recent Labs  ?Lab 05/19/21 ?0406 05/20/21 ?1638 05/21/21 ?0449  ?WBC 10.0 10.0 9.0  ?RBC 4.22 4.34 4.28  ?HGB 12.7* 13.0 12.9*  ?HCT 39.2 40.0 39.3  ?MCV 92.9 92.2 91.8  ?MCH 30.1 30.0 30.1  ?MCHC 32.4 32.5 32.8  ?RDW 15.2 15.1 15.1  ?PLT 207 212 196  ? ? ?Cardiac EnzymesNo results for input(s): TROPONINI in the last 168 hours. No results for input(s): TROPIPOC in the last 168 hours.  ? ?BNP ?  Recent Labs  ?Lab 05/17/21 ?1206  ?BNP 767.1*  ?  ? ?DDimer No results for input(s): DDIMER in the last 168 hours.  ? ?Radiology  ?  ?No results found. ? ?Cardiac Studies  ? ?Echo ? ?1. Left ventricular ejection fraction, by estimation, is 20 to 25%. The  ?left ventricle has severely decreased function. The left ventricle  ?demonstrates global hypokinesis. The left ventricular internal cavity size  ?was moderately dilated. Left  ?ventricular diastolic parameters are indeterminate.  ? 2. Right ventricular systolic function is mildly reduced. The right  ?ventricular size is mildly enlarged. There is moderately elevated  ?pulmonary artery systolic pressure. The estimated right ventricular  ?systolic pressure is 65.7 mmHg.  ? 3. Left atrial size was moderately dilated.  ? 4. Right atrial size was moderately dilated.  ? 5. The mitral valve is normal in structure. Mild mitral valve  ?regurgitation. No evidence of mitral stenosis.  ? 6. Tricuspid valve regurgitation is mild to moderate.  ? 7. The aortic valve is tricuspid. Aortic valve regurgitation is mild. No  ?aortic stenosis is present.  ? 8. Aortic dilatation noted. There is mild  dilatation of the aortic root,  ?measuring 39 mm.  ? 9. The inferior vena cava is dilated in size with <50% respiratory  ?variability, suggesting right atrial pressure of 15 mmHg.  ? ?Patient Profile  ?   ?76 y.o. male with chronic systolic CHF/NICM, permanent atrial fibrillation and prior atrial flutter, pulmonary HTN (felt combination WHO group 2 & 3), mild MR, mild-moderate TR, mild dilation of aortic root, hypertension, OSA, obesity, seizure, aortic atherosclerosis, COPD, hypertension, HLD, RBBB, QT prolongation who is being seen 05/17/2021 for the evaluation of shortness of breath, decompensated heart failure and AF RVR. ? ?Assessment & Plan  ?  ?  ?Acute on chronic systolic CHF/NICM:    Net negative 16.7  liters. His creatinine is okay so I am going to continue his IV diuresis today.  Switch to PO tomorrow and likely discharge. BP and renal function have otherwise not allowed med titration.  ? ?CKD II:   Creat has improved this admission.  ? ?Permanent atrial fibrillation, here with RVR:    Rate has been OK given the clinical scenario.    He has plans with EP after discharge.  Arrange follow up at discharge.  ?  ?Transaminitis:   I don't see a standing order for this but we can stop following.  ?  ?OSA:  Does not use CPAP.  ?  ?Aortic atherosclerosis:  Resume statins at discharge ?  ?COPD:   Continue current therapy.   ? ?Foot pain:  Improved after one dose of PO prednisone.  ? ?For questions or updates, please contact [place] ?Please consult [email or web address] for contact info under Cardiology/STEMI. ?  ?Signed, ?Minus Breeding, MD  ?05/21/2021, 11:25 AM   ? ?

## 2021-05-21 NOTE — Progress Notes (Addendum)
ANTICOAGULATION CONSULT NOTE ? ?Pharmacy Consult for Warfarin ?Indication: atrial fibrillation ? ?Allergies  ?Allergen Reactions  ? Dilaudid [Hydromorphone Hcl] Other (See Comments)  ?  Makes pt hyper   ? Hydromorphone Other (See Comments)  ?  hyperactiviity ?Other reaction(s): made him wild  ? Morphine Sulfate   ?  Other reaction(s): at high dose causes confusion  ? Tegretol [Carbamazepine] Hives  ? Carbamazepine Hives and Rash  ?  Other reaction(s): hives  ? ? ?Patient Measurements: ?Height: _0  (172.7 cm) ?Weight: 100.7 kg (221 lb 14.4 oz) ?IBW/kg (Calculated) : 68.4 ? ? ?Vital Signs: ?Temp: 97.5 ?F (36.4 ?C) (03/18 0749) ?Temp Source: Oral (03/18 0749) ?BP: 103/69 (03/18 0749) ?Pulse Rate: 90 (03/18 0749) ? ?Labs: ?Recent Labs  ?  05/19/21 ?0406 05/20/21 ?0359 05/21/21 ?0449  ?HGB 12.7* 13.0 12.9*  ?HCT 39.2 40.0 39.3  ?PLT 207 212 196  ?LABPROT 30.5* 23.2* 19.6*  ?INR 2.9* 2.1* 1.7*  ?CREATININE 1.26* 1.14  --   ? ? ? ?Estimated Creatinine Clearance: 62.4 mL/min (by C-G formula based on SCr of 1.14 mg/dL). ? ? ?PTA Regimen per patient:  ?Warfarin 5 mg daily ?Last clinic INR per patient: 2.5 ?Unable to read last clinic note with [facility] ? ?Assessment: ?76 y.o. male with medical history significant for permanent atrial fibrillation and prior atrial flutter who presented with shortness of breath, decompensated heart failure and AF RVR. Pharmacy consulted to manage warfarin. ? ?INR elevated at 4.4 upon admission. Warfarin dose held 3/14-3/16 and PTA regimen was resumed 3/17. INR continued to decline this morning to subtherapeutic range at 1.7. CBC stable. Expect with multiple held doses that INR will continue to decline. Will increase dose today, then resume home regimen when INR >2. ? ? ?Goal of Therapy:  ?INR 2-3 ?Monitor platelets by anticoagulation protocol: Yes ?  ?Plan:  ?Give warfarin 7.5 mg PO x1 dose ?Check INR daily while on warfarin ?Continue to monitor H&H and platelets ? ?Thank you for  allowing pharmacy to participate in this patient's care. ? ?Reatha Harps, PharmD ?PGY1 Pharmacy Resident ?05/21/2021 8:15 AM ?Check [email or web address] for unit specific pharmacy number ? ?

## 2021-05-21 NOTE — Progress Notes (Signed)
Complains of pain left foot. PA on call informed. Dr Percival Spanish will address during rounding. ? ?

## 2021-05-21 NOTE — Plan of Care (Signed)
?  Problem: Education: Goal: Ability to demonstrate management of disease process will improve Outcome: Progressing Goal: Ability to verbalize understanding of medication therapies will improve Outcome: Progressing Goal: Individualized Educational Video(s) Outcome: Progressing   Problem: Activity: Goal: Capacity to carry out activities will improve Outcome: Progressing   Problem: Cardiac: Goal: Ability to achieve and maintain adequate cardiopulmonary perfusion will improve Outcome: Progressing   

## 2021-05-21 NOTE — Plan of Care (Signed)
?  Problem: Cardiac: Goal: Ability to achieve and maintain adequate cardiopulmonary perfusion will improve Outcome: Progressing   

## 2021-05-21 NOTE — Progress Notes (Addendum)
?  05/21/21 0910  ?Vitals  ?BP 108/67  ?MAP (mmHg) 135  ?BP Location Left Arm  ?BP Method Automatic  ?Patient Position (if appropriate) Sitting  ?ECG Heart Rate (!) 127  ?Level of Consciousness  ?Level of Consciousness Alert  ?MEWS COLOR  ?MEWS Score Color Yellow  ?Oxygen Therapy  ?O2 Device Room Air  ?MEWS Score  ?MEWS Temp 0  ?MEWS Systolic 0  ?MEWS Pulse 2  ?MEWS RR 0  ?MEWS LOC 0  ?MEWS Score 2  ? ?Afib with RVR 140-160`s after ambulating to the bathroom.  Bp 108/76. HR came down 120s ?

## 2021-05-21 NOTE — Progress Notes (Signed)
Pharmacist Heart Failure Core Measure Documentation ? ?Assessment: ?Ruben Murray has an EF documented as 20% on 2/15 by echo. ? ?Rationale: Heart failure patients with left ventricular systolic dysfunction (LVSD) and an EF < 40% should be prescribed an angiotensin converting enzyme inhibitor (ACEI) or angiotensin receptor blocker (ARB) at discharge unless a contraindication is documented in the medical record. ? ?This patient is not currently on an ACEI or ARB for HF. ? ?This note is being placed in the record in order to provide documentation that a contraindication to the use of these agents is present for this encounter. ? ?ACE Inhibitor or Angiotensin Receptor Blocker is contraindicated ?(specify all that apply) ? ?_0   ACEI allergy AND ARB allergy ?_1   Angioedema ?_2   Moderate or severe aortic stenosis ?_3   Hyperkalemia ?_4   Hypotension ?_5   Renal artery stenosis ?_6   Worsening renal function, preexisting renal disease or dysfunction ? ?Unable to tolerate at this time. ? ?Thank you for allowing pharmacy to participate in this patient's care. ? ?Reatha Harps, PharmD ?PGY1 Pharmacy Resident ?05/21/2021 2:23 PM ?Check [email or web address] for unit specific pharmacy number ? ?

## 2021-05-22 DIAGNOSIS — I5023 Acute on chronic systolic (congestive) heart failure: Secondary | ICD-10-CM | POA: Diagnosis not present

## 2021-05-22 LAB — CBC
HCT: 40.2 % (ref 39.0–52.0)
Hemoglobin: 13.3 g/dL (ref 13.0–17.0)
MCH: 30.4 pg (ref 26.0–34.0)
MCHC: 33.1 g/dL (ref 30.0–36.0)
MCV: 91.8 fL (ref 80.0–100.0)
Platelets: 210 10*3/uL (ref 150–400)
RBC: 4.38 MIL/uL (ref 4.22–5.81)
RDW: 14.8 % (ref 11.5–15.5)
WBC: 11.4 10*3/uL — ABNORMAL HIGH (ref 4.0–10.5)
nRBC: 0 % (ref 0.0–0.2)

## 2021-05-22 LAB — PROTIME-INR
INR: 1.6 — ABNORMAL HIGH (ref 0.8–1.2)
Prothrombin Time: 19 seconds — ABNORMAL HIGH (ref 11.4–15.2)

## 2021-05-22 MED ORDER — WARFARIN SODIUM 7.5 MG PO TABS: 7.5000 mg | ORAL_TABLET | Freq: Once | ORAL | Status: DC

## 2021-05-22 MED ORDER — WARFARIN SODIUM 10 MG PO TABS: 10.0000 mg | ORAL_TABLET | Freq: Once | ORAL | Status: DC

## 2021-05-22 MED ORDER — WARFARIN SODIUM 10 MG PO TABS
10.0000 mg | ORAL_TABLET | Freq: Once | ORAL | Status: AC
  Administered 2021-05-22: 10 mg via ORAL
  Filled 2021-05-22: qty 1

## 2021-05-22 NOTE — Progress Notes (Addendum)
?  05/22/21 0907  ?Vitals  ?ECG Heart Rate (!) 182  ?MEWS COLOR  ?MEWS Score Color Yellow  ?MEWS Score  ?MEWS Temp 0  ?MEWS Systolic 0  ?MEWS Pulse 3  ?MEWS RR 0  ?MEWS LOC 0  ?MEWS Score 3  ? ?HR up 180-190s after ambulating to the bathroom. Patient asymptomatic. Settled to 100-115 Afib. BP checked 107/92. No SOB or CP reported. ?

## 2021-05-22 NOTE — TOC Initial Note (Signed)
Transition of Care (TOC) - Initial/Assessment Note  ? ? ?Patient Details  ?Name: Ruben Murray ?MRN: [id number] ?Date of Birth: 02-Apr-1945 ? ?Transition of Care (TOC) CM/SW Contact:    ?Carles Collet, RN ?Phone Number: ?05/22/2021, 2:00 PM ? ?Clinical Narrative:                 ?Patient from home w wife.  ?Follows at [facility] and is counseled by Ulysees Barns CSW.  ?Will place consult for HF Team to follow inpaient. ?Patient is followed by [facility] CM outpatient as well. ?Will place consult for [facility] Team to follow inpatient. ?Patient is active w [facility] for [facility] PT OT, they are aware of admission and request resumption orders for services.  ?Coverage- [name] ?Primary Care [name] ? ? ?Expected Discharge Plan: [place] ?Barriers to Discharge: Continued Medical Work up ? ? ?Patient Goals and CMS Choice ?  ?  ?  ? ?Expected Discharge Plan and Services ?Expected Discharge Plan: [place] ?  ?Discharge Planning Services: CM Consult ?Post Acute Care Choice: Home Health ?  ?                ?  ?  ?  ?  ?  ?  ?  ?  ?  ?  ? ?Prior Living Arrangements/Services ?  ?Lives with:: Spouse ?  ?       ?  ?  ?  ?  ? ?Activities of Daily Living ?Home Assistive Devices/Equipment: CPAP, Blood pressure cuff, Grab bars in shower, Scales, Shower chair with back, Walker (specify type), Dentures (specify type) ?ADL Screening (condition at time of admission) ?Patient's cognitive ability adequate to safely complete daily activities?: Yes ?Is the patient deaf or have difficulty hearing?: No ?Does the patient have difficulty seeing, even when wearing glasses/contacts?: No ?Does the patient have difficulty concentrating, remembering, or making decisions?: No ?Patient able to express need for assistance with ADLs?: Yes ?Does the patient have difficulty dressing or bathing?: No ?Independently performs ADLs?: Yes (appropriate for developmental age) ?Does the patient have difficulty walking or climbing stairs?:  Yes ?Weakness of Legs: Both ?Weakness of Arms/Hands: None ? ?Permission Sought/Granted ?  ?  ?   ?   ?   ?   ? ?Emotional Assessment ?  ?  ?  ?  ?  ?  ? ?Admission diagnosis:  Liver dysfunction [K76.89] ?Abnormal liver function [R94.5] ?Acute on chronic systolic congestive heart failure ([place]) [I50.23] ?Acute on chronic systolic CHF (congestive heart failure) ([place]) [I50.23] ?Longstanding persistent atrial fibrillation ([place]) [I48.11] ?Patient Active Problem List  ? Diagnosis Date Noted  ? Acute on chronic systolic CHF (congestive heart failure) ([place]) 05/17/2021  ? Hypercoagulable state ([place]) 05/10/2021  ? Left leg pain 04/24/2021  ? OSA (obstructive sleep apnea) 04/24/2021  ? Mitral regurgitation 04/24/2021  ? Tricuspid regurgitation 04/24/2021  ? Dilated aortic root ([place]) 04/24/2021  ? Permanent atrial fibrillation ([place]) 04/19/2021  ? Chronic arthropathy 04/13/2020  ? Callus 04/13/2020  ? Onychomycosis 12/12/2019  ? Left leg cellulitis 12/12/2019  ? Cellulitis 12/09/2019  ? Dilated cardiomyopathy ([place])   ? Atrial flutter ([place]) 11/03/2019  ? Atrial flutter with rapid ventricular response ([place]) 11/03/2019  ? Acquired thrombophilia ([place]) 01/15/2019  ? Chronic combined systolic and diastolic heart failure ([place]) 10/29/2017  ? Prolonged QT interval 10/29/2017  ? Sensorineural hearing loss (SNHL) of both ears 01/31/2017  ? Dysphonia 10/03/2016  ? Laryngopharyngeal reflux (LPR) 10/03/2016  ? Nasal  polyps 10/03/2016  ? Rhinitis medicamentosa 10/03/2016  ? Chronic laryngitis 10/03/2016  ? Hoarseness 08/31/2016  ? Moderate persistent asthma 08/02/2016  ? COPD GOLD II  08/02/2016  ? Morbid (severe) obesity due to excess calories ([place]) 08/02/2016  ? CAP (community acquired pneumonia) 03/07/2016  ? Community acquired pneumonia 03/06/2016  ? Essential hypertension 03/06/2016  ? Hyperlipidemia 03/06/2016  ? Right thyroid nodule 03/06/2016  ? Seizures ([place]) 03/06/2016  ? Fever 03/12/2015  ? Recurrent erosion of cornea 06/23/2011   ? ?PCP:  [name], MD ?Pharmacy:   ?[id number] [place]?[address]?Phone: [phone]: [phone]? ?[facility]?Phone: [phone]: [phone] ? ? ? ? ?Social Determinants of Health (SDOH) Interventions ?  ? ?Readmission Risk Interventions ?Readmission Risk Prevention Plan 12/11/2019  ?Transportation Screening Complete  ?PCP or Specialist Appt within 5-7 Days Complete  ?Home Care Screening Complete  ?Medication Review (RN CM) Complete  ?Some recent data might be hidden  ? ? ? ?

## 2021-05-22 NOTE — Progress Notes (Addendum)
ANTICOAGULATION CONSULT NOTE ? ?Pharmacy Consult for Warfarin ?Indication: atrial fibrillation ? ?Allergies  ?Allergen Reactions  ? Dilaudid [Hydromorphone Hcl] Other (See Comments)  ?  Makes pt hyper   ? Hydromorphone Other (See Comments)  ?  hyperactiviity ?Other reaction(s): made him wild  ? Morphine Sulfate   ?  Other reaction(s): at high dose causes confusion  ? Tegretol [Carbamazepine] Hives  ? Carbamazepine Hives and Rash  ?  Other reaction(s): hives  ? ? ?Patient Measurements: ?Height: _0  (172.7 cm) ?Weight: 98.9 kg (218 lb 1.6 oz) ?IBW/kg (Calculated) : 68.4 ? ? ?Vital Signs: ?Temp: 98.3 ?F (36.8 ?C) (03/19 0086) ?Temp Source: Oral (03/19 0716) ?BP: 104/69 (03/19 0716) ?Pulse Rate: 77 (03/19 0716) ? ?Labs: ?Recent Labs  ?  05/20/21 ?0359 05/21/21 ?0449 05/21/21 ?1240 05/22/21 ?0304  ?HGB 13.0 12.9*  --  13.3  ?HCT 40.0 39.3  --  40.2  ?PLT 212 196  --  210  ?LABPROT 23.2* 19.6*  --  19.0*  ?INR 2.1* 1.7*  --  1.6*  ?CREATININE 1.14  --  1.13  --   ? ? ? ?Estimated Creatinine Clearance: 62.4 mL/min (by C-G formula based on SCr of 1.13 mg/dL). ? ? ?PTA Regimen per patient:  ?Warfarin 5 mg daily ?Last clinic INR per patient: 2.5 ?Unable to read last clinic note with [facility] ? ?Assessment: ?76 y.o. male with medical history significant for permanent atrial fibrillation and prior atrial flutter who presented with shortness of breath, decompensated heart failure and AF RVR. Pharmacy consulted to manage warfarin. ? ?INR elevated at 4.4 upon admission. Warfarin dose held 3/14-3/16 and PTA regimen was resumed 3/17. INR continued to decline this morning to 1.6 after multiple held doses. Per discussion with MD, INR should be promptly increased. Will give another boosted dose today and expect a rise in INR tomorrow. If INR increases at all, home regimen can likely be resumed. CBC stable.  ? ? ?Goal of Therapy:  ?INR 2-3 ?Monitor platelets by anticoagulation protocol: Yes ?  ?Plan:  ?Give warfarin 10 mg PO  x1 dose ?Check INR daily while on warfarin ?Continue to monitor H&H and platelets ? ?Thank you for allowing pharmacy to participate in this patient's care. ? ?Reatha Harps, PharmD ?PGY1 Pharmacy Resident ?05/22/2021 8:28 AM ?Check [email or web address] for unit specific pharmacy number ? ?

## 2021-05-22 NOTE — Plan of Care (Signed)
?  Problem: Clinical Measurements: Goal: Ability to maintain clinical measurements within normal limits will improve Outcome: Progressing Goal: Will remain free from infection Outcome: Progressing Goal: Diagnostic test results will improve Outcome: Progressing Goal: Respiratory complications will improve Outcome: Progressing Goal: Cardiovascular complication will be avoided Outcome: Progressing   Problem: Activity: Goal: Risk for activity intolerance will decrease Outcome: Progressing   Problem: Nutrition: Goal: Adequate nutrition will be maintained Outcome: Progressing   

## 2021-05-23 ENCOUNTER — Ambulatory Visit: Payer: Self-pay

## 2021-05-23 DIAGNOSIS — I5023 Acute on chronic systolic (congestive) heart failure: Secondary | ICD-10-CM | POA: Diagnosis not present

## 2021-05-23 LAB — CBC
HCT: 41.2 % (ref 39.0–52.0)
Hemoglobin: 13.9 g/dL (ref 13.0–17.0)
MCH: 30.7 pg (ref 26.0–34.0)
MCHC: 33.7 g/dL (ref 30.0–36.0)
MCV: 90.9 fL (ref 80.0–100.0)
Platelets: 193 10*3/uL (ref 150–400)
RBC: 4.53 MIL/uL (ref 4.22–5.81)
RDW: 14.9 % (ref 11.5–15.5)
WBC: 10.5 10*3/uL (ref 4.0–10.5)
nRBC: 0 % (ref 0.0–0.2)

## 2021-05-23 LAB — PROTIME-INR
INR: 1.9 — ABNORMAL HIGH (ref 0.8–1.2)
Prothrombin Time: 21.5 seconds — ABNORMAL HIGH (ref 11.4–15.2)

## 2021-05-23 MED ORDER — METOPROLOL TARTRATE 100 MG PO TABS
100.0000 mg | ORAL_TABLET | Freq: Two times a day (BID) | ORAL | Status: DC
Start: 2021-05-23 — End: 2021-05-24
  Administered 2021-05-23 – 2021-05-24 (×3): 100 mg via ORAL
  Filled 2021-05-23 (×3): qty 1

## 2021-05-23 MED ORDER — ACETAMINOPHEN 500 MG PO TABS
1000.0000 mg | ORAL_TABLET | Freq: Four times a day (QID) | ORAL | Status: DC | PRN
  Administered 2021-05-23 (×2): 1000 mg via ORAL
  Filled 2021-05-23 (×2): qty 2

## 2021-05-23 MED ORDER — WARFARIN SODIUM 10 MG PO TABS
10.0000 mg | ORAL_TABLET | Freq: Once | ORAL | Status: AC
  Administered 2021-05-23: 10 mg via ORAL
  Filled 2021-05-23: qty 1

## 2021-05-23 NOTE — Progress Notes (Addendum)
? ?Progress Note ? ?Patient Name: Ruben Murray ?Date of Encounter: 05/23/2021 ? ?[facility] Cardiologist: Ruben Furbish, MD  ? ?Subjective  ? ?Earlier this morning was having rapid atrial fibrillation.  Difficult to rate control.  Blood pressures have been soft. ? ?Inpatient Medications  ?  ?Scheduled Meds: ? digoxin  0.25 mg Oral Daily  ? finasteride  5 mg Oral Q1200  ? fluticasone  1 spray Each Nare BID  ? furosemide  80 mg Intravenous BID  ? loratadine  10 mg Oral Daily  ? metoprolol tartrate  100 mg Oral BID  ? phenobarbital  97.2 mg Oral QHS  ? sodium chloride flush  3 mL Intravenous Q12H  ? triamcinolone cream  1 application. Topical BID  ? warfarin  10 mg Oral ONCE-1600  ? Warfarin - Pharmacist Dosing Inpatient   Does not apply B9390  ? ?Continuous Infusions: ? sodium chloride    ? ?PRN Meds: ?sodium chloride, acetaminophen, polyvinyl alcohol, sodium chloride flush  ? ?Vital Signs  ?  ?Vitals:  ? 05/23/21 0425 05/23/21 3009 05/23/21 2330 05/23/21 0762  ?BP:  111/77 106/69 110/81  ?Pulse: 99  76   ?Resp:   20 20  ?Temp: 97.7 ?F (36.5 ?C)  (!) 97.4 ?F (36.3 ?C) 97.6 ?F (36.4 ?C)  ?TempSrc: Oral  Oral Oral  ?SpO2:  98% 95% 95%  ?Weight:  97.4 kg    ?Height:      ? ? ?Intake/Output Summary (Last 24 hours) at 05/23/2021 1026 ?Last data filed at 05/23/2021 2633 ?Gross per 24 hour  ?Intake 1200 ml  ?Output 3700 ml  ?Net -2500 ml  ? ?Last 3 Weights 05/23/2021 05/22/2021 05/21/2021  ?Weight (lbs) 214 lb 11.7 oz 218 lb 1.6 oz 221 lb 14.4 oz  ?Weight (kg) 97.4 kg 98.93 kg 100.653 kg  ?   ? ?Telemetry  ?  ?A-fib with heart rate in the 170s.- Personally Reviewed ? ?ECG  ?  ?A-fib right bundle branch block 111 bpm on 05/20/2021- Personally Reviewed ? ?Physical Exam  ? ?GEN: No acute distress.   ?Neck: No JVD ?Cardiac: Irregularly irregular,tachycardic, no murmurs, rubs, or gallops.  ?Respiratory: Clear to auscultation bilaterally. ?GI: Soft, nontender, non-distended  ?MS: Mild lower extremity edema; No deformity. ?Neuro:   Nonfocal  ?Psych: Normal affect  ? ?Labs  ?  ?High Sensitivity Troponin:  No results for input(s): TROPONINIHS in the last 720 hours.   ?Chemistry ?Recent Labs  ?Lab 05/19/21 ?0406 05/20/21 ?0359 05/20/21 ?2051 05/21/21 ?1240  ?NA 137 138  --  135  ?K 3.4* 3.8  --  3.6  ?CL 95* 94*  --  95*  ?CO2 32 36*  --  28  ?GLUCOSE 94 96  --  82  ?BUN 28* 22  --  23  ?CREATININE 1.26* 1.14  --  1.13  ?CALCIUM 8.0* 8.1*  --  7.8*  ?MG 1.6* 2.0  --   --   ?PROT 5.9* 6.0* 6.5 5.7*  ?ALBUMIN 2.9* 3.0* 3.1* 3.0*  ?AST 95* 60* 53* 50*  ?ALT 267* 211* 190* 155*  ?ALKPHOS 104 105 118 108  ?BILITOT 1.1 1.0 1.2 0.8  ?GFRNONAA 59* >60  --  >60  ?ANIONGAP 10 8  --  12  ?  ?Lipids No results for input(s): CHOL, TRIG, HDL, LABVLDL, LDLCALC, CHOLHDL in the last 168 hours.  ?Hematology ?Recent Labs  ?Lab 05/21/21 ?3545 05/22/21 ?0304 05/23/21 ?6256  ?WBC 9.0 11.4* 10.5  ?RBC 4.28 4.38 4.53  ?HGB 12.9* 13.3  13.9  ?HCT 39.3 40.2 41.2  ?MCV 91.8 91.8 90.9  ?MCH 30.1 30.4 30.7  ?MCHC 32.8 33.1 33.7  ?RDW 15.1 14.8 14.9  ?PLT 196 210 193  ? ?Thyroid No results for input(s): TSH, FREET4 in the last 168 hours.  ?BNP ?Recent Labs  ?Lab 05/17/21 ?1206  ?BNP 767.1*  ?  ?DDimer No results for input(s): DDIMER in the last 168 hours.  ? ?Radiology  ?  ?No results found. ? ?Cardiac Studies  ? ?Echo ?  ?1. Left ventricular ejection fraction, by estimation, is 20 to 25%. The  ?left ventricle has severely decreased function. The left ventricle  ?demonstrates global hypokinesis. The left ventricular internal cavity size  ?was moderately dilated. Left  ?ventricular diastolic parameters are indeterminate.  ? 2. Right ventricular systolic function is mildly reduced. The right  ?ventricular size is mildly enlarged. There is moderately elevated  ?pulmonary artery systolic pressure. The estimated right ventricular  ?systolic pressure is 82.5 mmHg.  ? 3. Left atrial size was moderately dilated.  ? 4. Right atrial size was moderately dilated.  ? 5. The mitral valve is  normal in structure. Mild mitral valve  ?regurgitation. No evidence of mitral stenosis.  ? 6. Tricuspid valve regurgitation is mild to moderate.  ? 7. The aortic valve is tricuspid. Aortic valve regurgitation is mild. No  ?aortic stenosis is present.  ? 8. Aortic dilatation noted. There is mild dilatation of the aortic root,  ?measuring 39 mm.  ? 9. The inferior vena cava is dilated in size with <50% respiratory  ?variability, suggesting right atrial pressure of 15 mmHg.  ? ?Patient Profile  ?   ?77 y.o. male with a EF 20 to 25%, atrial fibrillation rapid ventricular response here with acute on chronic systolic heart failure. ?Has permanent atrial fibrillation, prior atrial flutter, pulmonary hypertension felt to be combination of group 2 and 3, mild mitral regurgitation, mild dilation of aortic root, hypertension, obstructive sleep apnea, obesity, seizures, aortic atherosclerosis, COPD, hypertension, right bundle branch block, QT prolongation ? ?Assessment & Plan  ?  ?Atrial fibrillation with rapid ventricular response permanent ?- I went ahead and increased his metoprolol to 100 mg twice a day.  Lets see how he does especially with his somewhat soft blood pressure.  He is also on digoxin 0.25 mg once a day. ?-He saw Dr. Curt Murray to consider CRT defibrillator with possible AV nodal ablation.  Plan was for 06/15/2021. ? ?Acute on chronic systolic heart failure with nonischemic cardiomyopathy ?- 18 L out.  Currently on furosemide 80 mg IV twice daily. ?-I will repeat basic metabolic profile tomorrow.  Still continues to diurese quite a bit. ? ?Elevated LFTs ?- Secondary to heart failure.  Improving. ? ?Obstructive sleep apnea ?- Does not wish to use CPAP. ? ?Aortic atherosclerosis ?- Resume statin therapy on DC.  Holding because of elevated LFTs in the setting of heart failure ? ?COPD ?- Continue with current therapy. ? ?Social determinants of health ?- Very challenging home situation.  Recently moved to [facility] from [place].  He had a bedbug infestation.  His wife broke both of her hips.  She is going to the wound care center today.  He was thinking that he would be able to go home.  He put his pants on, blue jeans.  His heart rate currently is 165 bpm with A-fib.  He is asking for the nurse to come in to help him remove his pants because now they are wet with  urine. ? ?Would hold off on discharge at this time. ? ? ?For questions or updates, please contact [place] ?Please consult [email or web address] for contact info under  ? ?  ?   ?Signed, ?Ruben Furbish, MD  ?05/23/2021, 10:26 AM    ?

## 2021-05-23 NOTE — Consult Note (Signed)
? ?  [facility] CM Inpatient Consult ? ? ?05/23/2021 ? ?Ruben Murray ?1946-01-10 ?[id number] ? ?Follow up:  Referral request ? ?Thank you for your referral request.  Patient is being  followed for community chronic care management with an external care management team confirmed with [facility] liaison. ? ?For questions, please contact: ? ?Natividad Brood, RN BSN CCM ?[facility] Liaison ? [phone] business mobile phone ?Toll free office [phone]  ?Fax number: [phone] ?[email or web address] ? ? ?

## 2021-05-23 NOTE — Plan of Care (Signed)
?  Problem: Education: Goal: Ability to demonstrate management of disease process will improve Outcome: Progressing Goal: Ability to verbalize understanding of medication therapies will improve Outcome: Progressing Goal: Individualized Educational Video(s) Outcome: Progressing   Problem: Activity: Goal: Capacity to carry out activities will improve Outcome: Progressing   Problem: Cardiac: Goal: Ability to achieve and maintain adequate cardiopulmonary perfusion will improve Outcome: Progressing   

## 2021-05-23 NOTE — Progress Notes (Signed)
Mobility Specialist Progress Note ? ?Pt inappropriate for mobility specialist at this time given spike of pt's HR at rest (150-180's) and/or precautions as advised by RN. Mobility specialist to hold this morning, will continue to follow for readiness. ? ? ?Holland Falling ?Mobility Specialist ?Phone Number [phone] ? ?

## 2021-05-23 NOTE — Progress Notes (Signed)
ANTICOAGULATION CONSULT NOTE ? ?Pharmacy Consult for Warfarin ?Indication: atrial fibrillation ? ?Allergies  ?Allergen Reactions  ? Dilaudid [Hydromorphone Hcl] Other (See Comments)  ?  Makes pt hyper   ? Hydromorphone Other (See Comments)  ?  hyperactiviity ?Other reaction(s): made him wild  ? Morphine Sulfate   ?  Other reaction(s): at high dose causes confusion  ? Tegretol [Carbamazepine] Hives  ? Carbamazepine Hives and Rash  ?  Other reaction(s): hives  ? ? ?Patient Measurements: ?Height: _0  (172.7 cm) ?Weight: 97.4 kg (214 lb 11.7 oz) ?IBW/kg (Calculated) : 68.4 ? ? ?Vital Signs: ?Temp: 97.4 ?F (36.3 ?C) (03/20 1610) ?Temp Source: Oral (03/20 9604) ?BP: 106/69 (03/20 0728) ?Pulse Rate: 76 (03/20 0728) ? ?Labs: ?Recent Labs  ?  05/21/21 ?0449 05/21/21 ?1240 05/22/21 ?0304 05/23/21 ?5409  ?HGB 12.9*  --  13.3 13.9  ?HCT 39.3  --  40.2 41.2  ?PLT 196  --  210 193  ?LABPROT 19.6*  --  19.0* 21.5*  ?INR 1.7*  --  1.6* 1.9*  ?CREATININE  --  1.13  --   --   ? ? ? ?Estimated Creatinine Clearance: 61.9 mL/min (by C-G formula based on SCr of 1.13 mg/dL). ? ? ?PTA Regimen per patient:  ?Warfarin 5 mg daily ?Last clinic INR per patient: 2.5 ?Unable to read last clinic note with [facility] ? ?Assessment: ?76 y.o. male with medical history significant for permanent atrial fibrillation and prior atrial flutter who presented with shortness of breath, decompensated heart failure and AF RVR. Pharmacy consulted to manage warfarin. ? ?INR 1.9 today ? ? ?Goal of Therapy:  ?INR 2-3 ?Monitor platelets by anticoagulation protocol: Yes ?  ?Plan:  ?Repeat warfarin 10 mg PO x1 dose ?Check INR daily while on warfarin ?Continue to monitor H&H and platelets ? ?Thank you ?Anette Guarneri, PharmD ?05/23/2021 8:03 AM ?Check [email or web address] for unit specific pharmacy number ? ?

## 2021-05-23 NOTE — Progress Notes (Signed)
Patient refused CPAP at this time. Patient aware to let staff know if he would like to wear during his hospital stay.  ?

## 2021-05-24 ENCOUNTER — Other Ambulatory Visit (HOSPITAL_COMMUNITY): Payer: Self-pay

## 2021-05-24 DIAGNOSIS — I5023 Acute on chronic systolic (congestive) heart failure: Secondary | ICD-10-CM | POA: Diagnosis not present

## 2021-05-24 LAB — BASIC METABOLIC PANEL
Anion gap: 9 (ref 5–15)
BUN: 23 mg/dL (ref 8–23)
CO2: 36 mmol/L — ABNORMAL HIGH (ref 22–32)
Calcium: 8.4 mg/dL — ABNORMAL LOW (ref 8.9–10.3)
Chloride: 93 mmol/L — ABNORMAL LOW (ref 98–111)
Creatinine, Ser: 1.08 mg/dL (ref 0.61–1.24)
GFR, Estimated: >60 mL/min (ref >60)
Glucose, Bld: 101 mg/dL — ABNORMAL HIGH (ref 70–99)
Potassium: 3.5 mmol/L (ref 3.5–5.1)
Sodium: 138 mmol/L (ref 135–145)

## 2021-05-24 LAB — CBC
HCT: 43.4 % (ref 39.0–52.0)
Hemoglobin: 14.1 g/dL (ref 13.0–17.0)
MCH: 30.1 pg (ref 26.0–34.0)
MCHC: 32.5 g/dL (ref 30.0–36.0)
MCV: 92.5 fL (ref 80.0–100.0)
Platelets: 180 10*3/uL (ref 150–400)
RBC: 4.69 MIL/uL (ref 4.22–5.81)
RDW: 15.1 % (ref 11.5–15.5)
WBC: 10.4 10*3/uL (ref 4.0–10.5)
nRBC: 0 % (ref 0.0–0.2)

## 2021-05-24 LAB — PROTIME-INR
INR: 2.3 — ABNORMAL HIGH (ref 0.8–1.2)
Prothrombin Time: 25.5 seconds — ABNORMAL HIGH (ref 11.4–15.2)

## 2021-05-24 MED ORDER — METOPROLOL TARTRATE 100 MG PO TABS
100.0000 mg | ORAL_TABLET | Freq: Two times a day (BID) | ORAL | 2 refills | Status: DC
  Filled 2021-05-24: qty 60, 30d supply, fill #0

## 2021-05-24 MED ORDER — DIGOXIN 250 MCG PO TABS
0.2500 mg | ORAL_TABLET | Freq: Every day | ORAL | 2 refills | Status: DC
  Filled 2021-05-24: qty 30, 30d supply, fill #0

## 2021-05-24 MED ORDER — WARFARIN SODIUM 7.5 MG PO TABS: 7.5000 mg | ORAL_TABLET | Freq: Once | ORAL | Status: DC

## 2021-05-24 MED ORDER — TORSEMIDE 20 MG PO TABS
40.0000 mg | ORAL_TABLET | Freq: Two times a day (BID) | ORAL | 2 refills | Status: DC
  Filled 2021-05-24: qty 120, 30d supply, fill #0

## 2021-05-24 NOTE — Progress Notes (Signed)
ANTICOAGULATION CONSULT NOTE ? ?Pharmacy Consult for Warfarin ?Indication: atrial fibrillation ? ?Allergies  ?Allergen Reactions  ? Dilaudid [Hydromorphone Hcl] Other (See Comments)  ?  Makes pt hyper   ? Hydromorphone Other (See Comments)  ?  hyperactiviity ?Other reaction(s): made him wild  ? Morphine Sulfate   ?  Other reaction(s): at high dose causes confusion  ? Tegretol [Carbamazepine] Hives  ? Carbamazepine Hives and Rash  ?  Other reaction(s): hives  ? ? ?Patient Measurements: ?Height: _0  (172.7 cm) ?Weight: 97.4 kg (214 lb 11.7 oz) ?IBW/kg (Calculated) : 68.4 ? ? ?Vital Signs: ?Temp: 98.2 ?F (36.8 ?C) (03/21 0417) ?Temp Source: Oral (03/21 0417) ?BP: 96/63 (03/21 0419) ? ?Labs: ?Recent Labs  ?  05/21/21 ?1240 05/22/21 ?0304 05/22/21 ?0304 05/23/21 ?1610 05/24/21 ?9604  ?HGB  --  13.3   < > 13.9 14.1  ?HCT  --  40.2  --  41.2 43.4  ?PLT  --  210  --  193 180  ?LABPROT  --  19.0*  --  21.5* 25.5*  ?INR  --  1.6*  --  1.9* 2.3*  ?CREATININE 1.13  --   --   --  1.08  ? < > = values in this interval not displayed.  ? ? ? ?Estimated Creatinine Clearance: 63.8 mL/min (by C-G formula based on SCr of 1.08 mg/dL). ? ? ?PTA Regimen per patient:  ?Warfarin 5 mg daily ?Last clinic INR per patient: 2.5 ?Unable to read last clinic note with [facility] ? ?Assessment: ?76 y.o. male with medical history significant for permanent atrial fibrillation and prior atrial flutter who presented with shortness of breath, decompensated heart failure and AF RVR. Pharmacy consulted to manage warfarin. ? ?INR 2.3 today ? ? ?Goal of Therapy:  ?INR 2-3 ?Monitor platelets by anticoagulation protocol: Yes ?  ?Plan:  ?Warfarin 7.5 mg PO x1 dose ?Check INR daily while on warfarin ?Continue to monitor H&H and platelets ? ?Thank you ?Krystn Dermody Anastasovites BS ?Pharmacy Student ?05/24/2021 8:16 AM ? ? ? ? ? ?

## 2021-05-24 NOTE — Progress Notes (Signed)
Pt A/O x4. OOB w/ 1 assist. Admitted for a.fib see note. Denies pain and SOB. VSS. Educated provided on meds and follow up. Pt adequate to discharge.  ?

## 2021-05-24 NOTE — Care Management Important Message (Signed)
Important Message ? ?Patient Details  ?Name: Ruben Murray ?MRN: [id number] ?Date of Birth: 1945-06-04 ? ? ?Medicare Important Message Given:  Yes ? ? ? ? ?[name] ?05/24/2021, 8:09 AM ?

## 2021-05-24 NOTE — Progress Notes (Signed)
? ?Progress Note ? ?Patient Name: Ruben Murray ?Date of Encounter: 05/24/2021 ? ?[facility] Cardiologist: [name], MD  ? ?Subjective  ? ?Earlier this morning was having rapid atrial fibrillation.  Difficult to rate control.  Blood pressures have been soft.  Heart rates are better after 100 mg of metoprolol.  He takes care of his wife with dementia and 2 broken hips at home.  His niece is helping with the bills.  Dr. [name] is his primary care physician. ? ?Inpatient Medications  ?  ?Scheduled Meds: ? digoxin  0.25 mg Oral Daily  ? finasteride  5 mg Oral Q1200  ? fluticasone  1 spray Each Nare BID  ? furosemide  80 mg Intravenous BID  ? loratadine  10 mg Oral Daily  ? metoprolol tartrate  100 mg Oral BID  ? phenobarbital  97.2 mg Oral QHS  ? sodium chloride flush  3 mL Intravenous Q12H  ? triamcinolone cream  1 application. Topical BID  ? warfarin  7.5 mg Oral ONCE-1600  ? Warfarin - Pharmacist Dosing Inpatient   Does not apply B8466  ? ?Continuous Infusions: ? sodium chloride    ? ?PRN Meds: ?sodium chloride, acetaminophen, polyvinyl alcohol, sodium chloride flush  ? ?Vital Signs  ?  ?Vitals:  ? 05/23/21 1936 05/24/21 0417 05/24/21 0419 05/24/21 5993  ?BP:   96/63 (!) 105/58  ?Pulse:    94  ?Resp:    18  ?Temp: 97.9 ?F (36.6 ?C) 98.2 ?F (36.8 ?C)  97.6 ?F (36.4 ?C)  ?TempSrc: Oral Oral  Oral  ?SpO2: 96%  98% 100%  ?Weight:      ?Height:      ? ? ?Intake/Output Summary (Last 24 hours) at 05/24/2021 0958 ?Last data filed at 05/24/2021 5701 ?Gross per 24 hour  ?Intake 1020 ml  ?Output 1950 ml  ?Net -930 ml  ? ?Last 3 Weights 05/23/2021 05/22/2021 05/21/2021  ?Weight (lbs) 214 lb 11.7 oz 218 lb 1.6 oz 221 lb 14.4 oz  ?Weight (kg) 97.4 kg 98.93 kg 100.653 kg  ?   ? ?Telemetry  ?  ?A-fib with heart rate 100-1 10 s currently.- Personally Reviewed ? ?ECG  ?  ?A-fib right bundle branch block 111 bpm on 05/20/2021- Personally Reviewed ? ?Physical Exam  ? ?GEN: No acute distress.   ?Neck: No JVD ?Cardiac:  Irregularly irregular, mildly tachycardic, no murmurs, rubs, or gallops.  ?Respiratory: Clear to auscultation bilaterally. ?GI: Soft, nontender, non-distended  ?MS: Mild lower extremity edema; No deformity. ?Neuro:  Nonfocal  ?Psych: Normal affect  ? ?Labs  ?  ?High Sensitivity Troponin:  No results for input(s): TROPONINIHS in the last 720 hours.   ?Chemistry ?Recent Labs  ?Lab 05/19/21 ?0406 05/20/21 ?0359 05/20/21 ?2051 05/21/21 ?1240 05/24/21 ?0409  ?NA 137 138  --  135 138  ?K 3.4* 3.8  --  3.6 3.5  ?CL 95* 94*  --  95* 93*  ?CO2 32 36*  --  28 36*  ?GLUCOSE 94 96  --  82 101*  ?BUN 28* 22  --  23 23  ?CREATININE 1.26* 1.14  --  1.13 1.08  ?CALCIUM 8.0* 8.1*  --  7.8* 8.4*  ?MG 1.6* 2.0  --   --   --   ?PROT 5.9* 6.0* 6.5 5.7*  --   ?ALBUMIN 2.9* 3.0* 3.1* 3.0*  --   ?AST 95* 60* 53* 50*  --   ?ALT 267* 211* 190* 155*  --   ?ALKPHOS 104 105  118 108  --   ?BILITOT 1.1 1.0 1.2 0.8  --   ?GFRNONAA 59* >60  --  >60 >60  ?ANIONGAP 10 8  --  12 9  ?  ?Lipids No results for input(s): CHOL, TRIG, HDL, LABVLDL, LDLCALC, CHOLHDL in the last 168 hours.  ?Hematology ?Recent Labs  ?Lab 05/22/21 ?0304 05/23/21 ?3567 05/24/21 ?0409  ?WBC 11.4* 10.5 10.4  ?RBC 4.38 4.53 4.69  ?HGB 13.3 13.9 14.1  ?HCT 40.2 41.2 43.4  ?MCV 91.8 90.9 92.5  ?MCH 30.4 30.7 30.1  ?MCHC 33.1 33.7 32.5  ?RDW 14.8 14.9 15.1  ?PLT 210 193 180  ? ?Thyroid No results for input(s): TSH, FREET4 in the last 168 hours.  ?BNP ?Recent Labs  ?Lab 05/17/21 ?1206  ?BNP 767.1*  ?  ?DDimer No results for input(s): DDIMER in the last 168 hours.  ? ?Radiology  ?  ?No results found. ? ?Cardiac Studies  ? ?Echo ?  ?1. Left ventricular ejection fraction, by estimation, is 20 to 25%. The  ?left ventricle has severely decreased function. The left ventricle  ?demonstrates global hypokinesis. The left ventricular internal cavity size  ?was moderately dilated. Left  ?ventricular diastolic parameters are indeterminate.  ? 2. Right ventricular systolic function is mildly  reduced. The right  ?ventricular size is mildly enlarged. There is moderately elevated  ?pulmonary artery systolic pressure. The estimated right ventricular  ?systolic pressure is 01.4 mmHg.  ? 3. Left atrial size was moderately dilated.  ? 4. Right atrial size was moderately dilated.  ? 5. The mitral valve is normal in structure. Mild mitral valve  ?regurgitation. No evidence of mitral stenosis.  ? 6. Tricuspid valve regurgitation is mild to moderate.  ? 7. The aortic valve is tricuspid. Aortic valve regurgitation is mild. No  ?aortic stenosis is present.  ? 8. Aortic dilatation noted. There is mild dilatation of the aortic root,  ?measuring 39 mm.  ? 9. The inferior vena cava is dilated in size with <50% respiratory  ?variability, suggesting right atrial pressure of 15 mmHg.  ? ?Patient Profile  ?   ?76 y.o. male with a EF 20 to 25%, atrial fibrillation rapid ventricular response here with acute on chronic systolic heart failure. ?Has permanent atrial fibrillation, prior atrial flutter, pulmonary hypertension felt to be combination of group 2 and 3, mild mitral regurgitation, mild dilation of aortic root, hypertension, obstructive sleep apnea, obesity, seizures, aortic atherosclerosis, COPD, hypertension, right bundle branch block, QT prolongation ? ?Assessment & Plan  ?  ?Atrial fibrillation with rapid ventricular response permanent ?- I went ahead and increased his metoprolol to 100 mg twice a day.  Lets see how he does especially with his somewhat soft blood pressure.  He is also on digoxin 0.25 mg once a day. ?-He saw Dr. Curt Bears,  CRT defibrillator with AV nodal ablation.  Plan was for 06/15/2021. ? ?Acute on chronic systolic heart failure with nonischemic cardiomyopathy ?- 19.7 L out.  Currently on furosemide 80 mg IV twice daily. ?- I will place him on torsemide 40 mg twice a day.  He was on 40 mg once a day at home.  Close follow-up with basic metabolic profile.  We will stay off the spironolactone because  of his prior potassium of 5.7 on admission. ? ?Elevated LFTs ?- Secondary to heart failure.  Improving. ? ?Obstructive sleep apnea ?- Does not wish to use CPAP. ? ?Aortic atherosclerosis ?- Resume statin therapy on DC.  Holding because of elevated LFTs in  the setting of heart failure.  I would also restrict his Tylenol usage to 2 g daily. ? ?COPD ?- Continue with current therapy. ? ?Social determinants of health ?- Very challenging home situation.  Recently moved to [facility] from [place], lives in [place].  He had a bedbug infestation.  His wife broke both of her hips.  She is going to the wound care center.   ? ?Okay with discharge. ? ? ?For questions or updates, please contact [place] ?Please consult [email or web address] for contact info under  ? ?  ?   ?Signed, ?[name], MD  ?05/24/2021, 9:58 AM    ?

## 2021-05-24 NOTE — Progress Notes (Signed)
Mobility Specialist Progress Note: ? ? 05/24/21 1130  ?Mobility  ?Activity Ambulated with assistance in hallway  ?Level of Assistance Modified independent, requires aide device or extra time  ?Assistive Device Four wheel walker  ?Distance Ambulated (ft) 100 ft  ?Activity Response Tolerated well  ?$Mobility charge 1 Mobility  ? ? ?During Mobility: HR 161 bpm ? ?Pt agreeable to mobility session this am. Required minG throughout session with rollator. Pt with L foot pain this am, limiting distance. HR peaked at 161 during session, pt asx. Left sitting up in chair, pt eager for d/c on his birthday! ? ?Nelta Numbers ?Acute Rehab ?Phone: [phone] ?Office Phone: [phone] ? ?

## 2021-05-24 NOTE — Discharge Summary (Addendum)
?Discharge Summary  ?  ?Patient ID: Ruben Murray ?MRN: [id number]; DOB: 1945/07/24 ? ?Admit date: 05/17/2021 ?Discharge date: 05/24/2021 ? ?PCP:  [name], MD ?  ?[facility] Providers ?Cardiologist:  [name], MD  ?Electrophysiologist:  [name], MD  { ? ?Discharge Diagnoses  ?  ?Principal Problem: ?  Permanent atrial fibrillation ([place]) ?Active Problems: ?  Essential hypertension ?  Hyperlipidemia ?  Acute on chronic systolic CHF (congestive heart failure) ([place]) ? ? ?Diagnostic Studies/Procedures  ?  ?N/a  ?_____________ ?  ?History of Present Illness   ?  ?Ruben Murray is a 76 y.o. male with  chronic systolic CHF/NICM, permanent atrial fibrillation and prior atrial flutter, pulmonary HTN (felt combination WHO group 2 & 3), mild MR, mild-moderate TR, mild dilation of aortic root, hypertension, OSA, obesity, seizure, aortic atherosclerosis, COPD, hypertension, HLD, RBBB, QT prolongation who was seen 05/17/2021 for the evaluation of shortness of breath, decompensated heart failure and AF RVR. ? ?The patient was diagnosed with atrial fibrillation in 2018 shortly after having PNA, EF 45% at that time. He initially converted to NSR with diltiazem then meds were adjusted given his LV dysfunction. Over the last several years he has had intermittent recurrence of afib/flutter as well as varying degrees of LV dysfunction/CHF requiring diuresis. He has been anticoagulated with warfarin because of drug interaction between Eliquis/Xarelto and phenobarbital - PCP Dr. Mannie Stabile follows his INR. He was started on amiodarone in 2020 and has required several cardioversions over the years. He was admitted 10/2019 for LLE cellulitis, Covid, atrial flutter, and EF 20-25% and cath 11/2019 showed normal coronaries. At [place] in 03/2020, he was out of rhythm but did not follow-up for cardioversion as planned. He was lost to follow-up between 03/2020 and 04/2021 when he returned to the hospital with worsening dyspnea, rapid  AF and CHF. He had been under a lot of stress as he was the primary caregiver for his wife who has dementia and suffered 2 hip fractures earlier this year. Per med rec upon that admission, he was no longer taking his amiodarone, losartan, digoxin, or Lasix. (Of note, he previously stopped Entresto due to cost.) 2D echocardiogram 04/20/21 showed continued LV dysfunction EF 20-25%, global HK, moderate LV dilation, mildly reduced RV function, moderately elevated PASP, moderate BAE, mild MR, mild-moderate TR, mild AI, mild dilation of aortic root, dilated IVC - EF consistent with prior study in 2021. He was diuresed and started back on GDMT. Amiodarone was felt no longer of benefit so this was discontinued. He saw Dr. Curt Bears back to consider CRT-D with possible AVN ablation if he developed recurrent RVR, planned for 06/15/21. He saw the HF TOC impact clinic on 3/1 at which time he was felt to be volume up so torsemide and spironolactone doses were increased. He saw Dr. [name] on 3/7 and was felt to be clinically stable. ?  ?Of note, digoxin level on 3/1 was <0.2. Although the patient affirms he was taking all of his medication as prescribed, [facility] called [facility] [place] the day of admission and they report he never picked up his digoxin or Jardiance, and did not pick up his torsemide or spironolactone until 3/11 (10 days after the doses were intended to be increased on 3/1). They also did not have metoprolol on file. The patient also reports using a mail order pharmacy at times but states he's not been getting any recent shipments. Of note, at recent DC on 2/19, the patient [facility] [place] [facility]. [facility] pharmacy was closed that day so we specifically had to clarify.) ?  ?He presented back to [facility]/14 by EMS with worsening SOB and weakness, found to be in AF RVR HR 130s with PVCs en route, initial BP 88/60, recheck 102/70. He stated his dyspnea has never fully improved after last  hospitalization but was much worse the last few days to the point where even a few steps caused dyspnea. He also noticed worsening lower extremity edema and abdominal bloating. No chest pain. He gets regular meals from [employer] and they otherwise avoid salt. He did not report any orthopnea but always sleeps in a recliner due to chronic back issues. He had multiple petechiae over his lower extremities which occurred around the time he began having itching. His home was found to be infested with bed bugs and he recently had it sprayed as part of a 4 week treatment. He was unsure how he can continue to care for himself and his wife in this condition. On admission, labs notable for hyperkalemia 5.7-5.8 (redrawn on same original sample), BUN 30, Cr 1.23, albumin 3.1, AST 268/ALT 448, Tbili 1.6, leukocytosis 12.6, Hgb 12.7, INR 4.4, BNP 767. Covid/flu/digoxin pending. CXR NAD. He was admitted to cardiology for further management.  ? ?Hospital Course  ?   ?Permanent Afib, RVR: rates were difficult to control but improved with metoprolol 152m BID along with Dig 0.2748mdaily ?-He saw Dr. CaCurt Bearss an outpatient with plans for CRT defibrillator with AV nodal ablation. Original plan was for 06/15/2021. ?-- discussed with pharmD, will resume home dose of coumadin 48m63maily ?-- followed by PCP ?  ?Acute on chronic systolic heart failure with nonischemic cardiomyopathy: He was diuresed with IV lasix, net - 19.7L with weight down 232>>214.73lbs at discharge ?-- transitioned to torsemide 23m24mD at discharge  ?-- BMET at follow up visit. Continuing to hold spironolactone because of his prior potassium of 5.7 on admission. ?-- continue metoprolol 100mg18m ?-- farxiga held at discharge, consider resuming at outpatient follow up if feasible from cost stand point ?  ?Elevated LFTs: ?- in the setting of acute CHF, improving prior to discharge ?-- AST 268>>168>>50, ALT 448>>349>>155 ?-- recheck LFTs at outpatient follow up ?   ?Obstructive sleep apnea: ?- Does not wish to use CPAP ?  ?Aortic atherosclerosis: ?- statin initially held in the setting of elevated LFTs in the setting of heart failure.   ?-- resume statin on discharge ?-- restrict tylenol use  ?  ?COPD: ?- Continue with current therapy. ?  ?Social determinants of health: ?- Unfortunately very challenging home situation.  [place] [facility] [place] had a bedbug infestation.  His wife broke both of her hips.  She is going to the wound care center.   ?-- [facility] ?Patient seen by Dr. SkainMarlou Porchdeemed stable for discharge home. Follow up in the [facility] clinic and the office. [facility]  ? ?Did the patient have an acute coronary syndrome (MI, NSTEMI, STEMI, etc) this admission?:  No                               ?Did the patient have a percutaneous coronary intervention (stent / angioplasty)?:  No.   ? ?The patient will be scheduled for a TOC follow  up appointment in 10-14 days.  A message has been sent to the [facility] and Scheduling Pool at the office where the patient should be seen for follow up. Patient will be seen in the [facility] HF clinic. ?_____________ ? ?Discharge Vitals ?Blood pressure 125/68, pulse (!) 102, temperature 97.9 ?F (36.6 ?C), temperature source Oral, resp. rate 18, height _0  (1.727 m), weight 97.4 kg, SpO2 97 %.  ?Filed Weights  ? 05/21/21 0357 05/22/21 0400 05/23/21 0426  ?Weight: 100.7 kg 98.9 kg 97.4 kg  ? ? ?Labs & Radiologic Studies  ?  ?CBC ?Recent Labs  ?  05/23/21 ?0312 05/24/21 ?0409  ?WBC 10.5 10.4  ?HGB 13.9 14.1  ?HCT 41.2 43.4  ?MCV 90.9 92.5  ?PLT 193 180  ? ?Basic Metabolic Panel ?Recent Labs  ?  05/21/21 ?1240 05/24/21 ?0409  ?NA 135 138  ?K 3.6 3.5  ?CL 95* 93*  ?CO2 28 36*  ?GLUCOSE 82 101*  ?BUN 23 23  ?CREATININE 1.13 1.08  ?CALCIUM 7.8* 8.4*  ? ?Liver Function Tests ?Recent Labs  ?  05/21/21 ?1240  ?AST 50*  ?ALT 155*  ?ALKPHOS  108  ?BILITOT 0.8  ?PROT 5.7*  ?ALBUMIN 3.0*  ? ?No results for input(s): LIPASE, AMYLASE in the last 72 hours. ?High Sensitivity Troponin:   ?No results for input(s): TROPONINIHS in the last 720 hours.  ?BNP ?

## 2021-05-24 NOTE — TOC Transition Note (Addendum)
Transition of Care (TOC) - CM/SW Discharge Note ? ? ?Patient Details  ?Name: Ruben Murray ?MRN: [id number] ?Date of Birth: 06/15/1945 ? ?Transition of Care (TOC) CM/SW Contact:  ?Zenon Mayo, RN ?Phone Number: ?05/24/2021, 12:09 PM ? ? ?Clinical Narrative:    ?Patient is active with [facility] for [place]. Need orders to resume at discharge.  NCM asked PA for orders.  NCM notified Anderson Malta with [facility] that patient is for dc today.  Patient states his caregiver would like to speak to this NCM, her name is Marzetta Board.  She states she takes care of his wife and [facility] comes out to see her so she would like [place] for him as well.  Patient states this is ok with him.  NCM informed Anderson Malta with [facility] and made referral to [facility] with [facility] for [place], [place].  She is able to take referral for [place], [place].  Soc will begin in 24 to 48 hrs post dc.  Patient has bed bugs and will be going to a new address per caregiver, [address] [place], [place] [address].  ? ? ?  ?Barriers to Discharge: Continued Medical Work up ? ? ?Patient Goals and CMS Choice ?  ?  ?  ? ?Discharge Placement ?  ?           ?  ?  ?  ?  ? ?Discharge Plan and Services ?  ?Discharge Planning Services: CM Consult ?Post Acute Care Choice: Home Health          ?  ?  ?  ?  ?  ?  ?  ?  ?  ?  ? ?Social Determinants of Health (SDOH) Interventions ?  ? ? ?Readmission Risk Interventions ?Readmission Risk Prevention Plan 12/11/2019  ?Transportation Screening Complete  ?PCP or Specialist Appt within 5-7 Days Complete  ?Home Care Screening Complete  ?Medication Review (RN CM) Complete  ?Some recent data might be hidden  ? ? ? ? ? ?

## 2021-05-25 ENCOUNTER — Other Ambulatory Visit (HOSPITAL_COMMUNITY): Payer: Self-pay

## 2021-05-25 DIAGNOSIS — I4892 Unspecified atrial flutter: Secondary | ICD-10-CM | POA: Diagnosis not present

## 2021-05-25 DIAGNOSIS — G40909 Epilepsy, unspecified, not intractable, without status epilepticus: Secondary | ICD-10-CM | POA: Diagnosis not present

## 2021-05-25 DIAGNOSIS — I272 Pulmonary hypertension, unspecified: Secondary | ICD-10-CM | POA: Diagnosis not present

## 2021-05-25 DIAGNOSIS — I110 Hypertensive heart disease with heart failure: Secondary | ICD-10-CM | POA: Diagnosis not present

## 2021-05-25 DIAGNOSIS — I4821 Permanent atrial fibrillation: Secondary | ICD-10-CM | POA: Diagnosis not present

## 2021-05-25 DIAGNOSIS — G4733 Obstructive sleep apnea (adult) (pediatric): Secondary | ICD-10-CM | POA: Diagnosis not present

## 2021-05-25 DIAGNOSIS — J449 Chronic obstructive pulmonary disease, unspecified: Secondary | ICD-10-CM | POA: Diagnosis not present

## 2021-05-25 DIAGNOSIS — I5023 Acute on chronic systolic (congestive) heart failure: Secondary | ICD-10-CM | POA: Diagnosis not present

## 2021-05-25 DIAGNOSIS — E785 Hyperlipidemia, unspecified: Secondary | ICD-10-CM | POA: Diagnosis not present

## 2021-05-26 ENCOUNTER — Telehealth (HOSPITAL_COMMUNITY): Payer: Self-pay

## 2021-05-26 NOTE — Telephone Encounter (Signed)
Contacted pt as reminder of appt with HF TOC clinic tomorrow. Directions to clinic provided. Pt instructed to bring med list with him. Pt also instructed to NOT take digoxin in AM as the provider may order digoxin level. Last level was 3/14 shortly after re-initiation of therapy. Pt verbalized understanding and confirmed appt tomorrow. ?

## 2021-05-27 ENCOUNTER — Inpatient Hospital Stay (HOSPITAL_COMMUNITY): Admit: 2021-05-27 | Payer: Medicare PPO

## 2021-06-01 DIAGNOSIS — R7303 Prediabetes: Secondary | ICD-10-CM | POA: Diagnosis not present

## 2021-06-01 DIAGNOSIS — I5042 Chronic combined systolic (congestive) and diastolic (congestive) heart failure: Secondary | ICD-10-CM | POA: Diagnosis not present

## 2021-06-01 DIAGNOSIS — F3341 Major depressive disorder, recurrent, in partial remission: Secondary | ICD-10-CM | POA: Diagnosis not present

## 2021-06-01 DIAGNOSIS — I10 Essential (primary) hypertension: Secondary | ICD-10-CM | POA: Diagnosis not present

## 2021-06-01 DIAGNOSIS — Z9289 Personal history of other medical treatment: Secondary | ICD-10-CM | POA: Diagnosis not present

## 2021-06-01 DIAGNOSIS — I4811 Longstanding persistent atrial fibrillation: Secondary | ICD-10-CM | POA: Diagnosis not present

## 2021-06-01 DIAGNOSIS — E7800 Pure hypercholesterolemia, unspecified: Secondary | ICD-10-CM | POA: Diagnosis not present

## 2021-06-02 ENCOUNTER — Telehealth: Payer: Self-pay | Admitting: *Deleted

## 2021-06-02 NOTE — Telephone Encounter (Signed)
Pt aware planned procedure for 4/12 has been cancelled per provider. ?Aware he will need to be seen in office again to redicuss plan of care. ?Pt agreeable. ?Aware scheduler will be in touch to reschedule.  ?

## 2021-06-03 ENCOUNTER — Encounter (HOSPITAL_COMMUNITY): Payer: Self-pay

## 2021-06-03 ENCOUNTER — Telehealth (HOSPITAL_COMMUNITY): Payer: Self-pay | Admitting: Surgery

## 2021-06-03 ENCOUNTER — Ambulatory Visit (HOSPITAL_COMMUNITY)
Admission: RE | Admit: 2021-06-03 | Discharge: 2021-06-03 | Disposition: A | Discharge status: Home / Self Care | Payer: Medicare PPO | Source: Ambulatory Visit | Attending: Adult Health | Admitting: Adult Health

## 2021-06-03 VITALS — BP 140/100 | HR 73 | Wt 229.0 lb

## 2021-06-03 DIAGNOSIS — I428 Other cardiomyopathies: Secondary | ICD-10-CM | POA: Insufficient documentation

## 2021-06-03 DIAGNOSIS — I110 Hypertensive heart disease with heart failure: Secondary | ICD-10-CM | POA: Diagnosis not present

## 2021-06-03 DIAGNOSIS — I272 Pulmonary hypertension, unspecified: Secondary | ICD-10-CM | POA: Diagnosis not present

## 2021-06-03 DIAGNOSIS — I5022 Chronic systolic (congestive) heart failure: Secondary | ICD-10-CM | POA: Insufficient documentation

## 2021-06-03 DIAGNOSIS — Z79899 Other long term (current) drug therapy: Secondary | ICD-10-CM | POA: Insufficient documentation

## 2021-06-03 DIAGNOSIS — I4821 Permanent atrial fibrillation: Secondary | ICD-10-CM | POA: Insufficient documentation

## 2021-06-03 DIAGNOSIS — Z7901 Long term (current) use of anticoagulants: Secondary | ICD-10-CM | POA: Diagnosis not present

## 2021-06-03 DIAGNOSIS — I10 Essential (primary) hypertension: Secondary | ICD-10-CM

## 2021-06-03 DIAGNOSIS — R569 Unspecified convulsions: Secondary | ICD-10-CM | POA: Diagnosis not present

## 2021-06-03 DIAGNOSIS — I4510 Unspecified right bundle-branch block: Secondary | ICD-10-CM | POA: Diagnosis not present

## 2021-06-03 DIAGNOSIS — G4733 Obstructive sleep apnea (adult) (pediatric): Secondary | ICD-10-CM | POA: Diagnosis not present

## 2021-06-03 LAB — BASIC METABOLIC PANEL
Anion gap: 8 (ref 5–15)
BUN: 25 mg/dL — ABNORMAL HIGH (ref 8–23)
CO2: 36 mmol/L — ABNORMAL HIGH (ref 22–32)
Calcium: 8.9 mg/dL (ref 8.9–10.3)
Chloride: 96 mmol/L — ABNORMAL LOW (ref 98–111)
Creatinine, Ser: 1.02 mg/dL (ref 0.61–1.24)
GFR, Estimated: >60 mL/min (ref >60)
Glucose, Bld: 101 mg/dL — ABNORMAL HIGH (ref 70–99)
Potassium: 4.4 mmol/L (ref 3.5–5.1)
Sodium: 140 mmol/L (ref 135–145)

## 2021-06-03 LAB — DIGOXIN LEVEL: Digoxin Level: 1.1 ng/mL (ref 0.8–2.0)

## 2021-06-03 MED ORDER — TORSEMIDE 20 MG PO TABS: 60.0000 mg | ORAL_TABLET | Freq: Two times a day (BID) | ORAL | 2 refills | Status: DC

## 2021-06-03 MED ORDER — SPIRONOLACTONE 25 MG PO TABS: 12.5000 mg | ORAL_TABLET | Freq: Every day | ORAL | 2 refills | Status: DC

## 2021-06-03 NOTE — Telephone Encounter (Signed)
I called patient and informed him of lab results and recommendations per Darrick Grinder NP to stop Digoxin.  He asked when to restart it back.  I let him know that we would contact him back if needed to restart prior to follow-up appt.  Medication list in CHL updated. ?

## 2021-06-03 NOTE — Patient Instructions (Addendum)
EKG done today. ? ?Labs done today. We will contact you only if your labs are abnormal. ? ?START Spironolactone 12.78m (1/2 tablet) by mouth daily.  ? ?INCREASE Torsemide to 640m(3 tablets) by mouth 2 times daily.  ? ?No other medication changes were made. Please continue all current medications as prescribed. ? ?Your physician recommends that you schedule a follow-up appointment in: 2-3 weeks with our Clinic Pharmacist and in 6-8 weeks with our NP/PA Clinic here in our office. ? ?If you have any questions or concerns before your next appointment please send [place] message through my[place]ar call our office at [phone]  ? ?TO LEAVE A MESSAGE FOR THE NURSE SELECT OPTION 2, PLEASE LEAVE A MESSAGE INCLUDING: ?YOUR NAME ?DATE OF BIRTH ?CALL BACK NUMBER ?REASON FOR CALL**this is important as we prioritize the call backs ? ?YOU WILL RECEIVE A CALL BACK THE SAME DAY AS LONG AS YOU CALL BEFORE 4:00 PM ? ? ?Do the following things EVERYDAY: ?Weigh yourself in the morning before breakfast. Write it down and keep it in a log. ?Take your medicines as prescribed ?Eat low salt foods--Limit salt (sodium) to 2000 mg per day.  ?Stay as active as you can everyday ?Limit all fluids for the day to less than 2 liters ? ? ?At the [facility] and your health needs are our priority. As part of our continuing mission to provide you with exceptional heart care, we have created designated Provider Care Teams. These Care Teams include your primary Cardiologist (physician) and Advanced Practice Providers (APPs- Physician Assistants and Nurse Practitioners) who all work together to provide you with the care you need, when you need it.  ? ?You may see any of the following providers on your designated Care Team at your next follow up: ?Dr DaGlori BickersDr DaLoralie ChampagneAmDarrick GrinderNP ?BrLyda JesterPA ?LaAudry RilesPharmD ? ? ?Please be sure to bring in all your medications bottles to every appointment.  ? ?

## 2021-06-03 NOTE — Progress Notes (Addendum)
? ? ?HEART & VASCULAR TRANSITION  Follow up  ? ? ? ?Referring Physician: Dr Marisue Ivan ?Primary Care: Dr Mannie Stabile  ?Primary Cardiologist: Dr [name] ?EP : Dr Curt Bears ? ?HPI: ?Ruben Murray is a 76 year old with a h/o chronic HFrEF, NICM, permanent A fib, pulmonary JF, mild Ruben, mild-mod TR, htn, osa, seizure, COPD , HTN, and RBBB.  ? ?Lost to  cardiology f/u 2022.  ?  ?Admitted  04/2021 for a/c CHF in the setting of rapid afib. Echo showed EF 20-25%, RV mildly reduced, RVSP 48 mmHg, mild Ruben, mild TR. He was diuresed w/ IV Lasix. Afib treated w/ rate control, w/ metoprolol and digoxin. Amiodarone discontinued given chronic afib. V-rate improved in the 90s.  After diuresis, he was transition to PO torsemide. GDMT added. Instructed to f/u w/ EP post hospital and referred to [facility] clinic.  Wt charted at 227 lb.  ?  ?He was seen by Dr. Curt Bears . Plan is for CRT-D implant w/ plans to consider AV node ablation if further issues w/ rapid afib.  ? ?Had initial TOC visit 05/04/2021. HF meds adjust but he did not start them. Some medications are from mail order and others [facility].   ? ?Admitted 05/17/21 with AF RVR and A/C HFrEF.  Prior to admit he never picked up digoxin, Toprol, or jardiance. Diuresed with IV lasix.   Discharged on lopressor 100 mg twice a day, digoxin 0.25 mcg, and torsemide 40 mg twice a day. Discharge weight 214 pounds. Discharged 05/24/21.  ? ?Today he returns for HF follow up.Overall feeling fine. SOB with exertion. Says he stays in the house unless he goes to doctors appointments. Denies PND/Orthopnea. Appetite ok. He needs assistance getting food. He has trouble with transportation. He no longer drives. No fever or chills. Weight at home has been 220-222 pounds. He did not take his medications this morning.  Lives with his wife. His wife has dementia and requires 24 hour assistance. Neighbors are helping her out. Home Health following.  ? ?Cardiac Testing  ?Echo 04/2016 EF 40-45%, RV normal, mod  TR ?Echo 10/2017 EF 25%, RV moderately reduced (in Afib) ?Echo 03/2018 EF 40-45%, RV mildly dilated, systolic fx normal  ?Echo 10/2019 EF 20-25%, RV moderately reduced, RVSP 38, Mild Ruben, mild TR ?LHC 11/2019 Normal coronaries, RHC mild pulmonary hypertension; PA 34/22, mean PA pressure 28 mmHg  ?Echo 04/2021 EF 20-25%, RV mildly reduced, RVSP 48, mild Ruben, mild TR  ? ? ?Past Medical History:  ?Diagnosis Date  ? Acquired thrombophilia ([place]) 01/15/2019  ? Aortic atherosclerosis ([place])   ? Aortic root dilatation ([facility])   ? CAP (community acquired pneumonia) 03/06/2016  ? Chronic laryngitis 10/03/2016  ? Chronic systolic CHF (congestive heart failure) ([place])   ? COPD GOLD II  08/02/2016  ? Quit smoking 2000  PFT's  07/10/2016  FEV1 1.98 (70 % ) ratio 67  p 19 % improvement from saba p nothing  prior to study while of coreg so rec as of 08/02/2016 try off coreg and on bisoprolol     ? Essential hypertension 03/06/2016  ? Changed from coreg to bisoprolol due to copd with reversible component  08/02/2016 >>>   ? Hoarseness 08/31/2016  ? Referred to ent 08/31/2016 >>> seen 10/03/16 [name] dx gerd and rhinitis medicamentosa   > improved on f/u 01/31/17 on gerd rx/ flonase and off afrin  ? Hyperlipidemia   ? Hypertension   ? Laryngopharyngeal reflux (LPR) 10/03/2016  ?  Mitral regurgitation   ? Moderate persistent asthma 08/02/2016  ? Morbid (severe) obesity due to excess calories ([place]) 08/02/2016  ? Nasal polyps 10/03/2016  ? NICM (nonischemic cardiomyopathy) ([place])   ? a. EF 40-45% in 04/2016, etiology not defined, managed medically.  ? OSA on CPAP   ? Permanent atrial fibrillation ([place])   ? Prolonged QT interval 10/29/2017  ? RBBB   ? Recurrent erosion of cornea 06/23/2011  ? Rhinitis medicamentosa 10/03/2016  ? Right thyroid nodule 03/06/2016  ? Seizures ([place])   ? "take RX daily" (03/06/2016)  ? Sensorineural hearing loss (SNHL) of both ears 01/31/2017  ? Tricuspid regurgitation   ? ? ?Current Outpatient Medications  ?Medication Sig Dispense  Refill  ? acetaminophen (TYLENOL) 500 MG tablet Take 1,000 mg by mouth at bedtime as needed for mild pain or headache.    ? Carboxymethylcellulose Sod PF 0.5 % SOLN Place 1 drop into both eyes 4 (four) times daily as needed (dry eyes).    ? Cholecalciferol (VITAMIN D3) 25 MCG (1000 UT) CAPS Take 1,000 Units by mouth daily.    ? digoxin (LANOXIN) 0.25 MG tablet Take 1 tablet (0.25 mg total) by mouth daily. 30 tablet 2  ? finasteride (PROSCAR) 5 MG tablet Take 5 mg by mouth daily at 12 noon.     ? fluticasone (FLONASE) 50 MCG/ACT nasal spray Place 1 spray into both nostrils 2 (two) times daily.    ? loratadine (CLARITIN) 10 MG tablet Take 10 mg by mouth daily.    ? metoprolol tartrate (LOPRESSOR) 100 MG tablet Take 1 tablet (100 mg total) by mouth 2 (two) times daily. 60 tablet 2  ? Multiple Vitamin (MULTIVITAMIN WITH MINERALS) TABS tablet Take 1 tablet by mouth daily at 12 noon.    ? PHENobarbital (LUMINAL) 97.2 MG tablet Take 97.2 mg by mouth daily.    ? pravastatin (PRAVACHOL) 40 MG tablet Take 40 mg by mouth at bedtime.     ? torsemide (DEMADEX) 20 MG tablet Take 2 tablets (40 mg total) by mouth 2 (two) times daily. 120 tablet 2  ? triamcinolone cream (KENALOG) 0.1 % Apply 1 application. topically 2 (two) times daily.    ? warfarin (COUMADIN) 5 MG tablet Take 5 mg by mouth daily.    ? ?No current facility-administered medications for this encounter.  ? ? ?Allergies  ?Allergen Reactions  ? Dilaudid [Hydromorphone Hcl] Other (See Comments)  ?  Makes pt hyper   ? Hydromorphone Other (See Comments)  ?  hyperactiviity ?Other reaction(s): made him wild  ? Morphine Sulfate   ?  Other reaction(s): at high dose causes confusion  ? Tegretol [Carbamazepine] Hives  ? Carbamazepine Hives and Rash  ?  Other reaction(s): hives  ? ? ?  ?Social History  ? ?Socioeconomic History  ? Marital status: Married  ?  Spouse name: Not on file  ? Number of children: Not on file  ? Years of education: Not on file  ? Highest education level:  Not on file  ?Occupational History  ? Not on file  ?Tobacco Use  ? Smoking status: Former  ?  Packs/day: 3.00  ?  Years: 48.00  ?  Pack years: 144.00  ?  Types: Cigarettes  ?  Quit date: 2000  ?  Years since quitting: 23.2  ? Smokeless tobacco: Never  ?Vaping Use  ? Vaping Use: Never used  ?Substance and Sexual Activity  ? Alcohol use: Never  ? Drug use: Never  ? Sexual activity:  Never  ?Other Topics Concern  ? Not on file  ?Social History Narrative  ? ** Merged History Encounter **  ?    ? ?Social Determinants of Health  ? ?Financial Resource Strain: Not on file  ?Food Insecurity: Not on file  ?Transportation Needs: No Transportation Needs  ? Lack of Transportation (Medical): No  ? Lack of Transportation (Non-Medical): No  ?Physical Activity: Not on file  ?Stress: Not on file  ?Social Connections: Not on file  ?Intimate Partner Violence: Not on file  ? ? ?  ?Family History  ?Problem Relation Age of Onset  ? Hypertension Mother   ? Other Father   ?     sudden cardiac arrest  ? ? ?Vitals:  ? 06/03/21 1012  ?BP: (!) 140/100  ?Pulse: 73  ?SpO2: 96%  ?Weight: 103.9 kg (229 lb)  ? ?Wt Readings from Last 3 Encounters:  ?06/03/21 103.9 kg (229 lb)  ?05/23/21 97.4 kg (214 lb 11.7 oz)  ?05/10/21 110.2 kg (243 lb)  ? ?Reds Clip 31%.  ? ?PHYSICAL EXAM: ?General:  Arrived in a wheelchair.  No respiratory difficulty ?HEENT: normal ?Neck: supple. JVP 8-9. Carotids 2+ bilat; no bruits. No lymphadenopathy or thryomegaly appreciated. ?Cor: PMI nondisplaced. Irregular rate & rhythm. No rubs, gallops or murmurs. ?Lungs: clear ?Abdomen: soft, nontender, nondistended. No hepatosplenomegaly. No bruits or masses. Good bowel sounds. ?Extremities: no cyanosis, clubbing, rash, R and LLE 1+ edema ?Neuro: alert & oriented x 3, cranial nerves grossly intact. moves all 4 extremities w/o difficulty. Affect pleasant. ? ?ECG: A fib 75 bom RBBB ? ? ?ASSESSMENT & PLAN: ?1. Chronic Systolic Heart Failure ?- NICM. Dates back to at least 2018, EFs have  fluctuated from 20-40% ?- most recent echo 2/22 EF 20-25%, RV moderately reduced ?- LHC in 2021 showed normal coronaries ?- suspect tachy mediated CM from long history of chronic, poorly rate controlled Af

## 2021-06-03 NOTE — Progress Notes (Signed)
?Heart and Vascular Care Navigation ? ?06/03/2021 ? ?Ruben Murray ?[birth year] ?[id number] ? ?Reason for Referral: paramedicine referral ?  ?Engaged with patient face to face for follow up visit for Heart and Vascular Care Coordination. ?                                                                                                                                     ?Paramedicine Initial Assessment: ? ?Housing:  ?In what kind of housing do you live? House/apt/trailer/shelter? apartment ? ?Do you rent/pay a mortgage/own? rent ? ?Do you live with anyone? wife ? ?Are you currently worried about losing your housing? no ? ?Social:  ?What is your current marital status? married ? ?Do you have family or friends who live locally? Has a niece who lives locally who they are working on giving durable POA to so she can manage their finances. ? ?Food:  ?Pt and wife unable to prepare own food at this time but get meals on wheels then neighbor also helps to prepare food.  Offered to refer to [facility] Meals but pt does not think it is needed at this time. ? ?Income:  ?What is your current source of income? Retirement income- about $2100 between him and his wife ? ?Insurance:  ?Are you currently insured? [name] ? ?Do you have prescription coverage? Yes ? ?Also reports that someone in his PCP office is trying to work with him and his wife to apply for Medicaid so they can get PCS services.  Provided verbal permission to speak with his niece as to what has been done so far and what further assistance they might need. ? ? ?Transportation:  ?Do you have transportation to your medical appointments? Gets rides from neighbor. ? ? ? Daily Health Needs: ?Do you have a working scale at home? yes ? ?How do you manage your medications at home? Neighbor, [place], manages medications ? ?Do you ever take your medications differently than prescribed? Not intentionally but per physician concerns with pt taking medications correctly ? ?Do you  have issues affording your medications? None reported at this time ? ?Do you have any concerns with mobility at home? ? ?Do you use any assistive devices at home or have PCS at home? Uses rolling walker ? ?Do you have a PCP? Dr Mannie Stabile ? ?Are there any additional barriers you see to getting the care you need? Not at this time ? ? ?HRT/VAS Care Coordination   ? ? Living arrangements for the past 2 months Apartment  ? Lives with: Spouse  ? Home Assistive Devices/Equipment CPAP; Blood pressure cuff; Grab bars in shower; Scales; Shower chair with back; Walker (specify type); Dentures (specify type)  ? DME Agency [employer]  ? [place]  ? ?  ? ? ?Social History:                                                                             ?  SDOH Screenings  ? ?Alcohol Screen: Not on file  ?Depression (PHQ2-9): Low Risk   ? PHQ-2 Score: 0  ?Financial Resource Strain: Not on file  ?Food Insecurity: Not on file  ?Housing: Low Risk   ? Last Housing Risk Score: 0  ?Physical Activity: Not on file  ?Social Connections: Not on file  ?Stress: Not on file  ?Tobacco Use: Medium Risk  ? Smoking Tobacco Use: Former  ? Smokeless Tobacco Use: Never  ? Passive Exposure: Not on file  ?Transportation Needs: No Transportation Needs  ? Lack of Transportation (Medical): No  ? Lack of Transportation (Non-Medical): No  ? ? ?SDOH Interventions: ?Financial Resources:  Him and his wife receive disability benefits which are about $2,200  ?  ?Food Insecurity:   Neighbor fixes food for them at this time  ?Housing Insecurity:   None reported  ?Transportation:    Neighbor drives them to and from appts  ? ?Follow-up plan:   ? ?Pt called niece to get additional information- awaiting return call to see if anything further can be done ? ?Referral sent to paramedics for review and assignment ? ?Will continue to follow and assist as needed ? ?Jorge Ny, LCSW ?Clinical Social Worker ?[facility] ?Desk#:  [phone] ?Cell#: [phone] ? ? ? ?

## 2021-06-03 NOTE — Telephone Encounter (Signed)
-----  Message from Conrad [place], NP sent at 06/03/2021 12:59 PM EDT ----- ?Dig level is high. Please call and ask him to stop taking digoxin. ?

## 2021-06-05 NOTE — Progress Notes (Signed)
?Cardiology Office Note:   ? ?Date:  06/06/2021  ? ?ID:  Ruben Murray, DOB Apr 25, 1945, MRN [id number] ? ?PCP:  Ruben Macadam, MD ?  ?[place] [place] [facility] Providers ?Cardiologist:  Ruben Furbish, MD ?Electrophysiologist:  Ruben Meredith Leeds, MD    ? ?Referring MD: Ruben Macadam, MD  ? ?Chief Complaint: hospital follow-up CHF ? ?History of Present Illness:   ? ?Ruben Murray is a 76 y.o. male with a hx of obesity, dilated aortic root, permanent atrial fibrillation, chronic combined CHF, nonischemic cardiomyopathy, COPD Gold II, OSA, pulmonary HTN (felt combination WHO group 2 & 3), mild to moderate TR, mild MR, RBBB, QT prolongation, and stroke.  ? ?He was diagnosed with atrial fibrillation in 2018 shortly after having pneumonia, EF 45% at that time.  He initially converted to NSR with diltiazem with future adjustment of medications giving LV dysfunction.  Has had intermittent recurrence of A-fib/flutter as well as varying degrees of LV dysfunction/CHF requiring diuresis.  Has been anticoagulated with warfarin because of drug interaction between Eliquis/Xarelto and phenobarbital. INR followed by PCP. Started on amiodarone in 2020 and has required several cardioversions over the years. He quit smoking 20 years ago.  ? ?Admission 10/2019 for cellulitis, found to have EF 20 to 25%, cath 11/2019 showed normal coronaries.  Lost in follow-up between 03/25/2020 and 04/25/2021 when he returned to the hospital with worsening dyspnea, rapid AF and CHF.  He reported he was no longer taking his amiodarone, losartan, digoxin, or Lasix.  Echo 04/20/2021 showed continued LV dysfunction with EF 20 to 25%, global HK, moderate LV dilation, mildly reduced RV function, moderately elevated PASP, moderate BAE, mild MR, mild to moderate TR, mild AI, mild dilatation of aortic root, dilated IVC.  He saw Dr. Curt Bears 05/03/21 for consideration of CRT/D with possible AVN ablation if he developed recurrent RVR. Seen by HF TOC impact clinic 05/04/21 at which  time he was felt to be volume up so torsemide and spironolactone doses were increased. At [facility] 05/10/21 with Dr. [name], he was felt to be stable.  ? ?Admission 3/14-3/21/23 with worsending SOB and weakness, found to be in AF RVR HR 130s with PVCs en route, hypotensive. Reported that he could only take a few steps before becoming dyspneic. BNP was 767, hyperkalemia 5.7-5.8, difficult to control HR. He had improvement in HR with metoprolol 100 mg BID along with digoxin 0.25 mg daily. Diuresis with IV Lasix with net 19.7 L, weight down from 232 ? 214.73 lb. Discharge torsemide 40 mg once daily, no spironolactone. Resumed statin therapy which was held during hospitalization due to elevated LFTs. Very challenging home situation with caring for wife who broke both of her hips, and recent bedbug infestation. Implant of CRT-D with Dr. Curt Bears scheduled for 06/15/21 has been cancelled per Dr. Curt Bears, he would like to see the patient in the office again prior to proceeding due to issues of noncompliance. Digoxin was d/ced on 06/03/21 by Darrick Grinder, Heart Failure NP due to elevated blood level. Torsemide was increased to 60 mg twice daily.  ? ?Today, he is here with his neighbor and friend who helps him get to appointments as well as with other ADLs. Friend's wife helps patient and his wife with their medications, and follows-up to make certain they are following instructions from providers.  Reports he is doing so much better with the help of these friends.  Weight stable at 220 lb at home.  He is sleeping in a new recliner and is able  to lie back all the way, no orthopnea, PND. Frequently keeps his legs elevated. Reports good urine output and no leg edema that he has noted.  Dyspnea with exertion that he feels is currently stable.  He is able to walk a short distance without symptoms.  States he also has leg weakness. Denies chest pain, lower extremity edema, fatigue, palpitations, melena, hematuria, hemoptysis, diaphoresis,  weakness, presyncope, syncope, orthopnea, and PND. Reports prior infestation with bed bugs now cleared by the exterminator.  ? ?Past Medical History:  ?Diagnosis Date  ? Acquired thrombophilia ([place]) 01/15/2019  ? Aortic atherosclerosis ([place])   ? Aortic root dilatation (H[facility])   ? CAP (community acquired pneumonia) 03/06/2016  ? Chronic laryngitis 10/03/2016  ? Chronic systolic CHF (congestive heart failure) ([place])   ? COPD GOLD II  08/02/2016  ? Quit smoking 2000  PFT's  07/10/2016  FEV1 1.98 (70 % ) ratio 67  p 19 % improvement from saba p nothing  prior to study while of coreg so rec as of 08/02/2016 try off coreg and on bisoprolol     ? Essential hypertension 03/06/2016  ? Changed from coreg to bisoprolol due to copd with reversible component  08/02/2016 >>>   ? Hoarseness 08/31/2016  ? Referred to ent 08/31/2016 >>> seen 10/03/16 [name] dx gerd and rhinitis medicamentosa   > improved on f/u 01/31/17 on gerd rx/ flonase and off afrin  ? Hyperlipidemia   ? Hypertension   ? Laryngopharyngeal reflux (LPR) 10/03/2016  ? Mitral regurgitation   ? Moderate persistent asthma 08/02/2016  ? Morbid (severe) obesity due to excess calories ([place]) 08/02/2016  ? Nasal polyps 10/03/2016  ? NICM (nonischemic cardiomyopathy) ([place])   ? a. EF 40-45% in 04/2016, etiology not defined, managed medically.  ? OSA on CPAP   ? Permanent atrial fibrillation ([place])   ? Prolonged QT interval 10/29/2017  ? RBBB   ? Recurrent erosion of cornea 06/23/2011  ? Rhinitis medicamentosa 10/03/2016  ? Right thyroid nodule 03/06/2016  ? Seizures ([place])   ? "take RX daily" (03/06/2016)  ? Sensorineural hearing loss (SNHL) of both ears 01/31/2017  ? Tricuspid regurgitation   ? ? ?Past Surgical History:  ?Procedure Laterality Date  ? CARDIOVERSION N/A 12/13/2017  ? Procedure: CARDIOVERSION;  Surgeon: [name], MD;  Location: [place];  Service: Cardiovascular;  Laterality: N/A;  ? CARDIOVERSION N/A 09/25/2018  ? Procedure: CARDIOVERSION;  Surgeon: [name], MD;  Location: [place];  Service: Cardiovascular;  Laterality: N/A;  ? CARDIOVERSION N/A 11/04/2018  ? Procedure: CARDIOVERSION;  Surgeon: [name], MD;  Location: [place];  Service: Cardiovascular;  Laterality: N/A;  ? CARDIOVERSION N/A 04/11/2019  ? Procedure: CARDIOVERSION;  Surgeon: [name], MD;  Location: [facility] ENDOSCOPY;  Service: Cardiovascular;  Laterality: N/A;  ? CIRCUMCISION    ? JOINT REPLACEMENT    ? PERCUTANEOUS PINNING TOE FRACTURE Left   ? "big toe  ? REPLACEMENT TOTAL KNEE Left   ? RIGHT/LEFT HEART CATH AND CORONARY ANGIOGRAPHY N/A 11/06/2019  ? Procedure: RIGHT/LEFT HEART CATH AND CORONARY ANGIOGRAPHY;  Surgeon: [name], MD;  Location: [place] CV LAB;  Service: Cardiovascular;  Laterality: N/A;  ? TONSILLECTOMY AND ADENOIDECTOMY    ? ? ?Current Medications: ?Current Meds  ?Medication Sig  ? acetaminophen (TYLENOL) 500 MG tablet Take 1,000 mg by mouth at bedtime as needed for mild pain or headache.  ? Carboxymethylcellulose Sod PF 0.5 % SOLN Place 1 drop into both eyes 4 (four)  times daily as needed (dry eyes).  ? Cholecalciferol (VITAMIN D3) 25 MCG (1000 UT) CAPS Take 1,000 Units by mouth daily.  ? finasteride (PROSCAR) 5 MG tablet Take 5 mg by mouth daily at 12 noon.   ? fluticasone (FLONASE) 50 MCG/ACT nasal spray Place 1 spray into both nostrils 2 (two) times daily.  ? loratadine (CLARITIN) 10 MG tablet Take 10 mg by mouth daily.  ? metoprolol tartrate (LOPRESSOR) 100 MG tablet Take 1 tablet (100 mg total) by mouth 2 (two) times daily.  ? Multiple Vitamin (MULTIVITAMIN WITH MINERALS) TABS tablet Take 1 tablet by mouth daily at 12 noon.  ? PHENobarbital (LUMINAL) 97.2 MG tablet Take 97.2 mg by mouth daily.  ? pravastatin (PRAVACHOL) 40 MG tablet Take 40 mg by mouth at bedtime.   ? spironolactone (ALDACTONE) 25 MG tablet Take 0.5 tablets (12.5 mg total) by mouth daily.  ? torsemide (DEMADEX) 20 MG tablet Take 3 tablets (60 mg total) by mouth 2 (two) times  daily.  ? triamcinolone cream (KENALOG) 0.1 % Apply 1 application. topically 2 (two) times daily.  ? warfarin (COUMADIN) 5 MG tablet Take 5 mg by mouth daily.  ?  ? ?Allergies:   Dilaudid [hydromorphone hcl]

## 2021-06-06 ENCOUNTER — Ambulatory Visit (INDEPENDENT_AMBULATORY_CARE_PROVIDER_SITE_OTHER): Payer: Medicare PPO | Admitting: Nurse Practitioner

## 2021-06-06 ENCOUNTER — Encounter: Payer: Self-pay | Admitting: Nurse Practitioner

## 2021-06-06 VITALS — BP 104/66 | HR 78 | Ht 68.0 in | Wt 220.0 lb

## 2021-06-06 DIAGNOSIS — E785 Hyperlipidemia, unspecified: Secondary | ICD-10-CM

## 2021-06-06 DIAGNOSIS — I10 Essential (primary) hypertension: Secondary | ICD-10-CM

## 2021-06-06 DIAGNOSIS — Z7901 Long term (current) use of anticoagulants: Secondary | ICD-10-CM

## 2021-06-06 DIAGNOSIS — I428 Other cardiomyopathies: Secondary | ICD-10-CM | POA: Diagnosis not present

## 2021-06-06 DIAGNOSIS — I5042 Chronic combined systolic (congestive) and diastolic (congestive) heart failure: Secondary | ICD-10-CM | POA: Diagnosis not present

## 2021-06-06 DIAGNOSIS — I4821 Permanent atrial fibrillation: Secondary | ICD-10-CM | POA: Diagnosis not present

## 2021-06-06 NOTE — Patient Instructions (Signed)
Medication Instructions:  ? ?Your physician recommends that you continue on your current medications as directed. Please refer to the Current Medication list given to you today. ? ?*If you need a refill on your cardiac medications before your next appointment, please call your pharmacy* ? ?Follow-Up: ?At [facility], you and your health needs are our priority.  As part of our continuing mission to provide you with exceptional heart care, we have created designated Provider Care Teams.  These Care Teams include your primary Cardiologist (physician) and Advanced Practice Providers (APPs -  Physician Assistants and Nurse Practitioners) who all work together to provide you with the care you need, when you need it. ? ?We recommend signing up for the patient portal called "MyChart".  Sign up information is provided on this After Visit Summary.  MyChart is used to connect with patients for Virtual Visits (Telemedicine).  Patients are able to view lab/test results, encounter notes, upcoming appointments, etc.  Non-urgent messages can be sent to your provider as well.   ?To learn more about what you can do with MyChart, go to [email or web address].   ? ?Your next appointment:   ?6 month(s) ? ?The format for your next appointment:   ?In Person ? ?Provider:   ?[name], MD   ? ? ?Other Instructions ? Notify us if you notice increased difficulty lying flat, decreased urine output, increase in weight.  ? ?Recommend weighing daily and keeping a log. Please call our office if you have weight gain of 2 pounds overnight or 5 pounds in 1 week.  ? ?Date ? Time Weight  ? ?    ? ?    ? ?    ? ?    ? ?    ? ?    ? ?    ? ?    ?  ?HOW TO TAKE YOUR BLOOD PRESSURE: ?Rest 5 minutes before taking your blood pressure. ? Don?t smoke or drink caffeinated beverages for at least 30 minutes before. ?Take your blood pressure before (not after) you eat. ?Sit comfortably with your back supported and both feet on the floor (don?t cross your  legs). ?Elevate your arm to heart level on a table or a desk. ?Use the proper sized cuff. It should fit smoothly and snugly around your bare upper arm. There should be enough room to slip a fingertip under the cuff. The bottom edge of the cuff should be 1 inch above the crease of the elbow.  ?Please let us know if you become lightheaded or your blood pressure consistently X 3,  the top number ( systolic) stays under < 202.   ? ?Keep Heart Failure appointment and Dr. Curt Bears appointment.  Bring someone to appointments to assist you.  ? ? ? ? ?

## 2021-06-07 ENCOUNTER — Other Ambulatory Visit (HOSPITAL_COMMUNITY): Payer: Self-pay

## 2021-06-07 NOTE — Progress Notes (Signed)
Paramedicine Encounter ? ? ? Patient ID: Ruben Murray, male    DOB: 05-15-1945, 76 y.o.   MRN: [id number] ? ? ?Arrived for home visit for [facility] for our initial paramedicine visit. Today he reports feeling good with no complaints of shortness of breath, chest pain or dizziness. He has some bilateral lower leg swelling noted. Lungs clear. Vitals as noted.  ? ?Mr. Verba lives at home with his wife who has dementia and they have a neighbor Diplomatic Services operational officer who assists with giving Mr. Tackitt his meds and preparing meals. She has been filling his pill box and appears to have been filling it correctly. We confirmed medications and filled pill box in the home today. At present he needs the following refills sent to [facility] ?HF CLINIC-  ?-Metoprolol Tartrate  ?-Spironolactone 12.26m  ?-Torsemide 674mBID  ? ?BY PCP-  ?Vit D ?Lortadine ?Multivitamin  ? ?He reports that his PCP office was helping him apply for medicaid and CAP services but was unsure the status. I will have LCSW JeTammy Soursollow up.  ? ?We educated Mr. BlCallicottt length today about diet, fluid and medication compliance. He was grateful for this and understood. He was given a weight chart and understands to make sure to weigh daily. He has an old outdated scale and says he cannot afford a new digital one. I will reach out to HF clinic for same.  ? ?He agreed on weekly visits on Tuesdays. I will follow up in one week.  ? ?Appointments reviewed and confirmed.  ? ?Home visit complete and lasting two hours in the home.  ? ?-Note we noticed bed bugs in the home today, we made [name] aware and she plans to reach out to landlord.  ? ? ? ?Patient Care Team: ?HaCaren MacadamMD as PCP - General (Family Medicine) ?SkJerline PainMD as PCP - Cardiology (Cardiology) ?CaConstance HawMD as PCP - Electrophysiology (Cardiology) ?HaCaren MacadamMD (Family Medicine) ? ?Patient Active Problem List  ? Diagnosis Date Noted  ? Acute on chronic systolic CHF  (congestive heart failure) ([facility]03/14/2023  ? Hypercoagulable state (HCHigh [facility]03/09/2021  ? Left leg pain 04/24/2021  ? OSA (obstructive sleep apnea) 04/24/2021  ? Mitral regurgitation 04/24/2021  ? Tricuspid regurgitation 04/24/2021  ? Dilated aortic root ([facility]Book[facility]02/19/2023  ? Permanent atrial fibrillation ([facility]02/14/2023  ? Chronic arthropathy 04/13/2020  ? Callus 04/13/2020  ? Onychomycosis 12/12/2019  ? Left leg cellulitis 12/12/2019  ? Cellulitis 12/09/2019  ? Dilated cardiomyopathy (HCHomer  ? Atrial flutter ([facility]08/30/2021  ? Atrial flutter with rapid ventricular response (HCAjo08/30/2021  ? Acquired thrombophilia ([facility]New[facility]11/01/2019  ? Chronic combined systolic and diastolic heart failure ([facility]Wal[facility]08/26/2019  ? CHF (congestive heart failure) ([facility]Clovis08/26/2019  ? Prolonged QT interval 10/29/2017  ? Sensorineural hearing loss (SNHL) of both ears 01/31/2017  ? Dysphonia 10/03/2016  ? Laryngopharyngeal reflux (LPR) 10/03/2016  ? Nasal polyps 10/03/2016  ? Rhinitis medicamentosa 10/03/2016  ? Chronic laryngitis 10/03/2016  ? Hoarseness 08/31/2016  ? Moderate persistent asthma 08/02/2016  ? COPD GOLD II  08/02/2016  ? Morbid (severe) obesity due to excess calories (HCRothsville05/30/2018  ? CAP (community acquired pneumonia) 03/07/2016  ? Community acquired pneumonia 03/06/2016  ? Essential hypertension 03/06/2016  ? Hyperlipidemia 03/06/2016  ? Right thyroid nodule 03/06/2016  ? Seizures (HCLa [place]01/03/2016  ? Fever 03/12/2015  ? Recurrent erosion of cornea 06/23/2011  ? ? ?Current Outpatient Medications:  ?  acetaminophen (TYLENOL) 500  MG tablet, Take 1,000 mg by mouth at bedtime as needed for mild pain or headache., Disp: , Rfl:  ?  Carboxymethylcellulose Sod PF 0.5 % SOLN, Place 1 drop into both eyes 4 (four) times daily as needed (dry eyes)., Disp: , Rfl:  ?  Cholecalciferol (VITAMIN D3) 25 MCG (1000 UT) CAPS, Take 1,000 Units by mouth daily., Disp: , Rfl:  ?  finasteride (PROSCAR) 5 MG tablet, Take 5 mg by mouth daily at 12 noon.  , Disp: , Rfl:  ?  fluticasone (FLONASE) 50 MCG/ACT nasal spray, Place 1 spray into both nostrils 2 (two) times daily., Disp: , Rfl:  ?  loratadine (CLARITIN) 10 MG tablet, Take 10 mg by mouth daily., Disp: , Rfl:  ?  metoprolol tartrate (LOPRESSOR) 100 MG tablet, Take 1 tablet (100 mg total) by mouth 2 (two) times daily., Disp: 60 tablet, Rfl: 2 ?  Multiple Vitamin (MULTIVITAMIN WITH MINERALS) TABS tablet, Take 1 tablet by mouth daily at 12 noon., Disp: , Rfl:  ?  PHENobarbital (LUMINAL) 97.2 MG tablet, Take 97.2 mg by mouth daily., Disp: , Rfl:  ?  pravastatin (PRAVACHOL) 40 MG tablet, Take 40 mg by mouth at bedtime. , Disp: , Rfl:  ?  spironolactone (ALDACTONE) 25 MG tablet, Take 0.5 tablets (12.5 mg total) by mouth daily., Disp: 15 tablet, Rfl: 2 ?  torsemide (DEMADEX) 20 MG tablet, Take 3 tablets (60 mg total) by mouth 2 (two) times daily., Disp: 180 tablet, Rfl: 2 ?  triamcinolone cream (KENALOG) 0.1 %, Apply 1 application. topically 2 (two) times daily., Disp: , Rfl:  ?  warfarin (COUMADIN) 5 MG tablet, Take 5 mg by mouth daily., Disp: , Rfl:  ?Allergies  ?Allergen Reactions  ? Dilaudid [Hydromorphone Hcl] Other (See Comments)  ?  Makes pt hyper   ? Hydromorphone Other (See Comments)  ?  hyperactiviity ?Other reaction(s): made him wild  ? Morphine Sulfate   ?  Other reaction(s): at high dose causes confusion  ? Tegretol [Carbamazepine] Hives  ? Carbamazepine Hives and Rash  ?  Other reaction(s): hives  ? ? ? ?Social History  ? ?Socioeconomic History  ? Marital status: Married  ?  Spouse name: Not on file  ? Number of children: Not on file  ? Years of education: Not on file  ? Highest education level: Not on file  ?Occupational History  ? Not on file  ?Tobacco Use  ? Smoking status: Former  ?  Packs/day: 3.00  ?  Years: 48.00  ?  Pack years: 144.00  ?  Types: Cigarettes  ?  Quit date: 2000  ?  Years since quitting: 23.2  ? Smokeless tobacco: Never  ?Vaping Use  ? Vaping Use: Never used  ?Substance and Sexual  Activity  ? Alcohol use: Never  ? Drug use: Never  ? Sexual activity: Never  ?Other Topics Concern  ? Not on file  ?Social History Narrative  ? ** Merged History Encounter **  ?    ? ?Social Determinants of Health  ? ?Financial Resource Strain: Not on file  ?Food Insecurity: No Food Insecurity  ? Worried About Charity fundraiser in the Last Year: Never true  ? Ran Out of Food in the Last Year: Never true  ?Transportation Needs: No Transportation Needs  ? Lack of Transportation (Medical): No  ? Lack of Transportation (Non-Medical): No  ?Physical Activity: Not on file  ?Stress: Not on file  ?Social Connections: Not on file  ?Intimate Partner Violence:  Not on file  ? ? ?Physical Exam ? ? ? ? ? ?Future Appointments  ?Date Time Provider [place]  ?06/20/2021  4:15 PM [name], MD [facility]  ?06/29/2021 11:00 AM [facility] PHARMACY [facility] None  ?07/15/2021 11:00 AM [facility] PA/NP [facility] None  ?11/14/2021  3:00 PM [name], MD [facility]  ? ? ? ?ACTION: ?Home visit completed ? ? ? ? ? ? ?

## 2021-06-10 ENCOUNTER — Other Ambulatory Visit (HOSPITAL_COMMUNITY): Payer: Self-pay | Admitting: *Deleted

## 2021-06-10 ENCOUNTER — Telehealth (HOSPITAL_COMMUNITY): Payer: Self-pay | Admitting: Licensed Clinical Social Worker

## 2021-06-10 MED ORDER — TORSEMIDE 20 MG PO TABS: 60.0000 mg | ORAL_TABLET | Freq: Two times a day (BID) | ORAL | 2 refills | Status: DC

## 2021-06-10 MED ORDER — SPIRONOLACTONE 25 MG PO TABS
12.5000 mg | ORAL_TABLET | Freq: Every day | ORAL | 2 refills | Status: DC
Start: 2021-06-10 — End: 2022-03-15

## 2021-06-10 MED ORDER — METOPROLOL TARTRATE 100 MG PO TABS: 100.0000 mg | ORAL_TABLET | Freq: Two times a day (BID) | ORAL | 2 refills | Status: DC

## 2021-06-10 NOTE — Telephone Encounter (Signed)
Community Paramedic requested assistance from [place] in following up on home aid referrals that pt PCP is reportedly helping with.  During clinic visit last week the pt had stated his niece was assisting with all this and had provided permission for CSW to speak with her. ? ?CSW reached out to niece to discuss.  She confirms that they working with PCP office to get PCS or CAPs in place at home but that this plan has now shifted to SNF or ALF placement for both the pt and his wife due to high level of needs.  Reports that pt wife is being seen by [facility] and that they are working with a Education officer, museum through them to help them apply for Medicaid so they can transition into LTC facility. ? ?States that their pastor used to manage facilities in their area so he is helping with finding placement they would be comfortable with.  At this time does not feel as if they need further support in finding placement. ? ?CSW also mentioned that we could potentially help with some refrigerated/frozen meals for pt but reports no need for this at this time- confirms that neighbor is providing food alongside meals on wheels just as pt had reported. ? ?Pt niece states no need for further support at this time but encouraged to reach out if they have needs in the future. ? ?Jorge Ny, LCSW ?Clinical Social Worker ?[facility] ?Desk#: [phone] ?Cell#: [phone] ? ?

## 2021-06-13 ENCOUNTER — Other Ambulatory Visit (HOSPITAL_COMMUNITY): Payer: Self-pay

## 2021-06-14 ENCOUNTER — Other Ambulatory Visit (HOSPITAL_COMMUNITY): Payer: Self-pay

## 2021-06-14 NOTE — Progress Notes (Signed)
Paramedicine Encounter ? ? ? Patient ID: Ruben Murray, male    DOB: 1945-11-02, 76 y.o.   MRN: [id number] ? ? ?Home visit for Ruben Murray today went well. He reports feeling find with no complaints of shortness of breath, chest pain or dizziness. He states he is sleeping well with no trouble and he reports he is urinating often with no trouble. No lower leg edema noted. Lungs clear.  ? ?He was compliant with his medications over the last week only missing one dose of Proscar. I reviewed medications and confirmed same filling pill box for one week. No refills needed.  ? ?We reviewed upcoming appointments and he agreed with all and has them listed in his calendar.  ? ?I plan to come out in one week, he agreed with same. Home visit complete.  ? ? ? ? ? ?Patient Care Team: ?[name], MD as PCP - General (Family Medicine) ?[name], MD as PCP - Cardiology (Cardiology) ?[name], MD as PCP - Electrophysiology (Cardiology) ?[name], MD (Family Medicine) ? ?Patient Active Problem List  ? Diagnosis Date Noted  ? Acute on chronic systolic CHF (congestive heart failure) ([place]) 05/17/2021  ? Hypercoagulable state ([place]) 05/10/2021  ? Left leg pain 04/24/2021  ? OSA (obstructive sleep apnea) 04/24/2021  ? Mitral regurgitation 04/24/2021  ? Tricuspid regurgitation 04/24/2021  ? Dilated aortic root ([place]) 04/24/2021  ? Permanent atrial fibrillation ([place]) 04/19/2021  ? Chronic arthropathy 04/13/2020  ? Callus 04/13/2020  ? Onychomycosis 12/12/2019  ? Left leg cellulitis 12/12/2019  ? Cellulitis 12/09/2019  ? Dilated cardiomyopathy ([place])   ? Atrial flutter ([place]) 11/03/2019  ? Atrial flutter with rapid ventricular response ([place]) 11/03/2019  ? Acquired thrombophilia ([place]) 01/15/2019  ? Chronic combined systolic and diastolic heart failure ([place]) 10/29/2017  ? CHF (congestive heart failure) ([place]) 10/29/2017  ? Prolonged QT interval 10/29/2017  ? Sensorineural hearing loss (SNHL) of both ears 01/31/2017  ?  Dysphonia 10/03/2016  ? Laryngopharyngeal reflux (LPR) 10/03/2016  ? Nasal polyps 10/03/2016  ? Rhinitis medicamentosa 10/03/2016  ? Chronic laryngitis 10/03/2016  ? Hoarseness 08/31/2016  ? Moderate persistent asthma 08/02/2016  ? COPD GOLD II  08/02/2016  ? Morbid (severe) obesity due to excess calories ([place]) 08/02/2016  ? CAP (community acquired pneumonia) 03/07/2016  ? Community acquired pneumonia 03/06/2016  ? Essential hypertension 03/06/2016  ? Hyperlipidemia 03/06/2016  ? Right thyroid nodule 03/06/2016  ? Seizures ([place]) 03/06/2016  ? Fever 03/12/2015  ? Recurrent erosion of cornea 06/23/2011  ? ? ?Current Outpatient Medications:  ?  acetaminophen (TYLENOL) 500 MG tablet, Take 1,000 mg by mouth at bedtime as needed for mild pain or headache., Disp: , Rfl:  ?  Carboxymethylcellulose Sod PF 0.5 % SOLN, Place 1 drop into both eyes 4 (four) times daily as needed (dry eyes)., Disp: , Rfl:  ?  Cholecalciferol (VITAMIN D3) 25 MCG (1000 UT) CAPS, Take 1,000 Units by mouth daily., Disp: , Rfl:  ?  escitalopram (LEXAPRO) 20 MG tablet, Take 20 mg by mouth daily. Takes at bedtime., Disp: , Rfl:  ?  finasteride (PROSCAR) 5 MG tablet, Take 5 mg by mouth daily at 12 noon. , Disp: , Rfl:  ?  fluticasone (FLONASE) 50 MCG/ACT nasal spray, Place 1 spray into both nostrils 2 (two) times daily., Disp: , Rfl:  ?  loratadine (CLARITIN) 10 MG tablet, Take 10 mg by mouth daily., Disp: , Rfl:  ?  metoprolol tartrate (LOPRESSOR) 100 MG tablet, Take 1 tablet (  100 mg total) by mouth 2 (two) times daily., Disp: 180 tablet, Rfl: 2 ?  Multiple Vitamin (MULTIVITAMIN WITH MINERALS) TABS tablet, Take 1 tablet by mouth daily at 12 noon., Disp: , Rfl:  ?  PHENobarbital (LUMINAL) 97.2 MG tablet, Take 97.2 mg by mouth daily. Takes between 3:00-5:00pm., Disp: , Rfl:  ?  pravastatin (PRAVACHOL) 40 MG tablet, Take 40 mg by mouth at bedtime. , Disp: , Rfl:  ?  spironolactone (ALDACTONE) 25 MG tablet, Take 0.5 tablets (12.5 mg total) by mouth daily.,  Disp: 45 tablet, Rfl: 2 ?  torsemide (DEMADEX) 20 MG tablet, Take 3 tablets (60 mg total) by mouth 2 (two) times daily., Disp: 180 tablet, Rfl: 2 ?  triamcinolone cream (KENALOG) 0.1 %, Apply 1 application. topically 2 (two) times daily., Disp: , Rfl:  ?  warfarin (COUMADIN) 5 MG tablet, Take 5 mg by mouth daily., Disp: , Rfl:  ?Allergies  ?Allergen Reactions  ? Dilaudid [Hydromorphone Hcl] Other (See Comments)  ?  Makes pt hyper   ? Hydromorphone Other (See Comments)  ?  hyperactiviity ?Other reaction(s): made him wild  ? Morphine Sulfate   ?  Other reaction(s): at high dose causes confusion  ? Tegretol [Carbamazepine] Hives  ? Carbamazepine Hives and Rash  ?  Other reaction(s): hives  ? ? ? ?Social History  ? ?Socioeconomic History  ? Marital status: Married  ?  Spouse name: Not on file  ? Number of children: Not on file  ? Years of education: Not on file  ? Highest education level: Not on file  ?Occupational History  ? Not on file  ?Tobacco Use  ? Smoking status: Former  ?  Packs/day: 3.00  ?  Years: 48.00  ?  Pack years: 144.00  ?  Types: Cigarettes  ?  Quit date: 2000  ?  Years since quitting: 23.2  ? Smokeless tobacco: Never  ?Vaping Use  ? Vaping Use: Never used  ?Substance and Sexual Activity  ? Alcohol use: Never  ? Drug use: Never  ? Sexual activity: Never  ?Other Topics Concern  ? Not on file  ?Social History Narrative  ? ** Merged History Encounter **  ?    ? ?Social Determinants of Health  ? ?Financial Resource Strain: Not on file  ?Food Insecurity: No Food Insecurity  ? Worried About Charity fundraiser in the Last Year: Never true  ? Ran Out of Food in the Last Year: Never true  ?Transportation Needs: No Transportation Needs  ? Lack of Transportation (Medical): No  ? Lack of Transportation (Non-Medical): No  ?Physical Activity: Not on file  ?Stress: Not on file  ?Social Connections: Not on file  ?Intimate Partner Violence: Not on file  ? ? ?Physical Exam ?Vitals reviewed.  ?Constitutional:   ?    Appearance: Normal appearance. He is normal weight.  ?HENT:  ?   Head: Normocephalic.  ?   Nose: Nose normal.  ?   Mouth/Throat:  ?   Mouth: Mucous membranes are moist.  ?   Pharynx: Oropharynx is clear.  ?Eyes:  ?   Conjunctiva/sclera: Conjunctivae normal.  ?   Pupils: Pupils are equal, round, and reactive to light.  ?Cardiovascular:  ?   Rate and Rhythm: Normal rate and regular rhythm.  ?   Pulses: Normal pulses.  ?   Heart sounds: Normal heart sounds.  ?Pulmonary:  ?   Effort: Pulmonary effort is normal.  ?   Breath sounds: Normal breath sounds.  ?  Abdominal:  ?   General: Abdomen is flat.  ?   Palpations: Abdomen is soft.  ?Musculoskeletal:     ?   General: No swelling. Normal range of motion.  ?   Cervical back: Normal range of motion.  ?   Right lower leg: No edema.  ?   Left lower leg: No edema.  ?Skin: ?   General: Skin is warm and dry.  ?   Capillary Refill: Capillary refill takes less than 2 seconds.  ?Neurological:  ?   General: No focal deficit present.  ?   Mental Status: He is alert. Mental status is at baseline.  ?Psychiatric:     ?   Mood and Affect: Mood normal.  ? ? ? ? ? ? ?Future Appointments  ?Date Time Provider [place]  ?06/20/2021  4:15 PM [name], MD [facility]  ?06/29/2021 11:00 AM [facility] PHARMACY [facility] None  ?07/15/2021 11:00 AM [facility] PA/NP [facility] None  ?11/14/2021  3:00 PM [name], MD [facility]  ? ? ? ?ACTION: ?Home visit completed ? ? ? ? ? ? ?

## 2021-06-15 ENCOUNTER — Ambulatory Visit (HOSPITAL_COMMUNITY): Admit: 2021-06-15 | Payer: Medicare PPO | Admitting: Cardiology

## 2021-06-15 ENCOUNTER — Encounter (HOSPITAL_COMMUNITY): Payer: Self-pay

## 2021-06-15 SURGERY — BIV ICD INSERTION CRT-D

## 2021-06-20 ENCOUNTER — Encounter: Payer: Self-pay | Admitting: Cardiology

## 2021-06-20 ENCOUNTER — Ambulatory Visit (INDEPENDENT_AMBULATORY_CARE_PROVIDER_SITE_OTHER): Payer: Medicare PPO | Admitting: Cardiology

## 2021-06-20 VITALS — BP 100/74 | HR 69 | Ht 68.0 in | Wt 230.0 lb

## 2021-06-20 DIAGNOSIS — I4821 Permanent atrial fibrillation: Secondary | ICD-10-CM

## 2021-06-20 DIAGNOSIS — D6869 Other thrombophilia: Secondary | ICD-10-CM | POA: Diagnosis not present

## 2021-06-20 DIAGNOSIS — I5022 Chronic systolic (congestive) heart failure: Secondary | ICD-10-CM | POA: Diagnosis not present

## 2021-06-20 NOTE — Patient Instructions (Signed)
Medication Instructions:  ?Your physician recommends that you continue on your current medications as directed. Please refer to the Current Medication list given to you today. ? ?*If you need a refill on your cardiac medications before your next appointment, please call your pharmacy* ? ? ?Lab Work: ?None ordered ? ? ? ?Testing/Procedures: ?None ordered ? ? ?Follow-Up: ?At [facility], you and your health needs are our priority.  As part of our continuing mission to provide you with exceptional heart care, we have created designated Provider Care Teams.  These Care Teams include your primary Cardiologist (physician) and Advanced Practice Providers (APPs -  Physician Assistants and Nurse Practitioners) who all work together to provide you with the care you need, when you need it. ? ?We recommend signing up for the patient portal called "MyChart".  Sign up information is provided on this After Visit Summary.  MyChart is used to connect with patients for Virtual Visits (Telemedicine).  Patients are able to view lab/test results, encounter notes, upcoming appointments, etc.  Non-urgent messages can be sent to your provider as well.   ?To learn more about what you can do with MyChart, go to [email or web address].   ? ?Your next appointment:   ?3 month(s) ? ?The format for your next appointment:   ?In Person ? ?Provider:   ?EP APP ? ? ? ?Thank you for choosing [facility]!! ? ? ?Trinidad Curet, RN ?[phone] ? ? ? ?  ?

## 2021-06-20 NOTE — Progress Notes (Signed)
? ?Electrophysiology Office Note ? ? ?Date:  06/20/2021  ? ?ID:  DEMAURION DICIOCCIO, DOB 07-01-1945, MRN [id number] ? ?PCP:  [name], MD  ?Cardiologist:  [name] ?Primary Electrophysiologist:  Sima Lindenberger Meredith Leeds, MD   ? ?Chief Complaint: AF ?  ?History of Present Illness: ?Ruben Murray is a 76 y.o. male who is being seen today for the evaluation of AF at the request of [name]. Presenting today for electrophysiology evaluation. ? ?He has a history significant for obesity, hypertension, sleep apnea, seizures.  January 2020 he was treated for community-acquired pneumonia and was found to be in atrial fibrillation.  He had a failed cardioversion.  He had a repeat cardioversion 11/04/2018 after being loaded on amiodarone.  He is currently on warfarin.  Most recently, he was admitted 05/24/2021.  His atrial fibrillation was difficult to control, but improved with metoprolol and digoxin.  He was diuresed 19.7 L.  LFTs improved during hospitalization. ? ?Today, denies symptoms of palpitations, chest pain, shortness of breath, orthopnea, PND, lower extremity edema, claudication, dizziness, presyncope, syncope, bleeding, or neurologic sequela. The patient is tolerating medications without difficulties.  He was much improved.  He continues to keep the fluid off and has been diuresing.  He unfortunately has moved into a new apartment and feels that he is taking the bedbugs from his prior home to this new apartment in the new apartment needs to be exterminated.  He is working with a company to do that. ? ? ?Past Medical History:  ?Diagnosis Date  ? Acquired thrombophilia ([place]) 01/15/2019  ? Aortic atherosclerosis ([place])   ? Aortic root dilatation ([facility])   ? CAP (community acquired pneumonia) 03/06/2016  ? Chronic laryngitis 10/03/2016  ? Chronic systolic CHF (congestive heart failure) ([place])   ? COPD GOLD II  08/02/2016  ? Quit smoking 2000  PFT's  07/10/2016  FEV1 1.98 (70 % ) ratio 67  p 19 % improvement from saba p  nothing  prior to study while of coreg so rec as of 08/02/2016 try off coreg and on bisoprolol     ? Essential hypertension 03/06/2016  ? Changed from coreg to bisoprolol due to copd with reversible component  08/02/2016 >>>   ? Hoarseness 08/31/2016  ? Referred to ent 08/31/2016 >>> seen 10/03/16 [name] dx gerd and rhinitis medicamentosa   > improved on f/u 01/31/17 on gerd rx/ flonase and off afrin  ? Hyperlipidemia   ? Hypertension   ? Laryngopharyngeal reflux (LPR) 10/03/2016  ? Mitral regurgitation   ? Moderate persistent asthma 08/02/2016  ? Morbid (severe) obesity due to excess calories ([place]) 08/02/2016  ? Nasal polyps 10/03/2016  ? NICM (nonischemic cardiomyopathy) ([place])   ? a. EF 40-45% in 04/2016, etiology not defined, managed medically.  ? OSA on CPAP   ? Permanent atrial fibrillation ([place])   ? Prolonged QT interval 10/29/2017  ? RBBB   ? Recurrent erosion of cornea 06/23/2011  ? Rhinitis medicamentosa 10/03/2016  ? Right thyroid nodule 03/06/2016  ? Seizures ([place])   ? "take RX daily" (03/06/2016)  ? Sensorineural hearing loss (SNHL) of both ears 01/31/2017  ? Tricuspid regurgitation   ? ?Past Surgical History:  ?Procedure Laterality Date  ? CARDIOVERSION N/A 12/13/2017  ? Procedure: CARDIOVERSION;  Surgeon: [name], MD;  Location: [place];  Service: Cardiovascular;  Laterality: N/A;  ? CARDIOVERSION N/A 09/25/2018  ? Procedure: CARDIOVERSION;  Surgeon: [name], MD;  Location: [place];  Service: Cardiovascular;  Laterality: N/A;  ?  CARDIOVERSION N/A 11/04/2018  ? Procedure: CARDIOVERSION;  Surgeon: [name], MD;  Location: [place];  Service: Cardiovascular;  Laterality: N/A;  ? CARDIOVERSION N/A 04/11/2019  ? Procedure: CARDIOVERSION;  Surgeon: [name], MD;  Location[facility] ENDOSCOPY;  Service: Cardiovascular;  Laterality: N/A;  ? CIRCUMCISION    ? JOINT REPLACEMENT    ? PERCUTANEOUS PINNING TOE FRACTURE Left   ? "big toe  ? REPLACEMENT TOTAL KNEE Left   ? RIGHT/LEFT  HEART CATH AND CORONARY ANGIOGRAPHY N/A 11/06/2019  ? Procedure: RIGHT/LEFT HEART CATH AND CORONARY ANGIOGRAPHY;  Surgeon: [name], MD;  Location: [place] CV LAB;  Service: Cardiovascular;  Laterality: N/A;  ? TONSILLECTOMY AND ADENOIDECTOMY    ? ? ? ?Current Outpatient Medications  ?Medication Sig Dispense Refill  ? acetaminophen (TYLENOL) 500 MG tablet Take 1,000 mg by mouth at bedtime as needed for mild pain or headache.    ? Carboxymethylcellulose Sod PF 0.5 % SOLN Place 1 drop into both eyes 4 (four) times daily as needed (dry eyes).    ? Cholecalciferol (VITAMIN D3) 25 MCG (1000 UT) CAPS Take 1,000 Units by mouth daily.    ? escitalopram (LEXAPRO) 20 MG tablet Take 20 mg by mouth daily. Takes at bedtime.    ? finasteride (PROSCAR) 5 MG tablet Take 5 mg by mouth daily at 12 noon.     ? fluticasone (FLONASE) 50 MCG/ACT nasal spray Place 1 spray into both nostrils 2 (two) times daily.    ? loratadine (CLARITIN) 10 MG tablet Take 10 mg by mouth daily.    ? metoprolol tartrate (LOPRESSOR) 100 MG tablet Take 1 tablet (100 mg total) by mouth 2 (two) times daily. 180 tablet 2  ? Multiple Vitamin (MULTIVITAMIN WITH MINERALS) TABS tablet Take 1 tablet by mouth daily at 12 noon.    ? PHENobarbital (LUMINAL) 97.2 MG tablet Take 97.2 mg by mouth daily. Takes between 3:00-5:00pm.    ? pravastatin (PRAVACHOL) 40 MG tablet Take 40 mg by mouth at bedtime.     ? spironolactone (ALDACTONE) 25 MG tablet Take 0.5 tablets (12.5 mg total) by mouth daily. 45 tablet 2  ? triamcinolone cream (KENALOG) 0.1 % Apply 1 application. topically 2 (two) times daily.    ? warfarin (COUMADIN) 5 MG tablet Take 5 mg by mouth daily.    ? torsemide (DEMADEX) 20 MG tablet Take 3 tablets (60 mg total) by mouth 2 (two) times daily. 180 tablet 2  ? ?No current facility-administered medications for this visit.  ? ? ?Allergies:   Dilaudid [hydromorphone hcl], Hydromorphone, Morphine sulfate, Tegretol [carbamazepine], and Carbamazepine  ? ?Social  History:  The patient  reports that he quit smoking about 23 years ago. His smoking use included cigarettes. He has a 144.00 pack-year smoking history. He has never used smokeless tobacco. He reports that he does not drink alcohol and does not use drugs.  ? ?Family History:  The patient's family history includes Hypertension in his mother; Other in his father.  ? ?ROS:  Please see the history of present illness.   Otherwise, review of systems is positive for none.   All other systems are reviewed and negative.  ? ?PHYSICAL EXAM: ?VS:  BP 100/74   Pulse 69   Ht _0  (1.727 m)   Wt 230 lb (104.3 kg)   SpO2 97%   BMI 34.97 kg/m?  , BMI Body mass index is 34.97 kg/m?. ?GEN: Well nourished, well developed, in no acute distress  ?HEENT:  normal  ?Neck: no JVD, carotid bruits, or masses ?Cardiac: irregular; no murmurs, rubs, or gallops,no edema  ?Respiratory:  clear to auscultation bilaterally, normal work of breathing ?GI: soft, nontender, nondistended, + BS ?MS: no deformity or atrophy  ?Skin: warm and dry ?Neuro:  Strength and sensation are intact ?Psych: euthymic mood, full affect ? ?EKG:  EKG is ordered today. ?Personal review of the ekg ordered shows AF, rate 75  ? ?Recent Labs: ?04/19/2021: TSH 1.898 ?05/17/2021: B Natriuretic Peptide 767.1 ?05/20/2021: Magnesium 2.0 ?05/21/2021: ALT 155 ?05/24/2021: Hemoglobin 14.1; Platelets 180 ?06/03/2021: BUN 25; Creatinine, Ser 1.02; Potassium 4.4; Sodium 140  ? ? ?Lipid Panel  ?   ?Component Value Date/Time  ? CHOL 122 04/20/2021 0210  ? TRIG 60 04/20/2021 0210  ? HDL 37 (L) 04/20/2021 0210  ? CHOLHDL 3.3 04/20/2021 0210  ? VLDL 12 04/20/2021 0210  ? [place] 73 04/20/2021 0210  ? ? ? ?Wt Readings from Last 3 Encounters:  ?06/20/21 230 lb (104.3 kg)  ?06/14/21 219 lb (99.3 kg)  ?06/07/21 218 lb (98.9 kg)  ?  ? ? ?Other studies Reviewed: ?Additional studies/ records that were reviewed today include: TTE 04/20/21 ?Review of the above records today demonstrates:  ? 1. Left  ventricular ejection fraction, by estimation, is 20 to 25%. The  ?left ventricle has severely decreased function. The left ventricle  ?demonstrates global hypokinesis. The left ventricular internal cavity size  ?was mo

## 2021-06-21 ENCOUNTER — Other Ambulatory Visit (HOSPITAL_COMMUNITY): Payer: Self-pay

## 2021-06-21 NOTE — Progress Notes (Signed)
Paramedicine Encounter ? ? ? Patient ID: [name], male    DOB: Jul 22, 1945, 76 y.o.   MRN: [id number] ? ?Arrived for home visit today for Mr. Ruben Murray. Medication review and pill box filled for one week. We discussed appointments and confirmed clinic visit next week. He agreed and we plan to meet there for our weekly visit. He was compliant with all meds this week. He was seen by Dr. Curt Bears yesterday. Home visit complete.  ? ?Refills: NONE  ? ?ACTION: ?Home visit completed ? ? ? ? ? ? ?

## 2021-06-25 NOTE — Progress Notes (Incomplete)
***In Progress*** ? ?  ?Advanced Heart Failure Clinic Note  ? ?Referring Physician: Dr Marisue Ivan ?Primary Care: Dr Mannie Stabile  ?Primary Cardiologist: Dr [name] ?EP : Dr Curt Bears ?  ?HPI: ?Mr Ruben Murray is a 76 year old with a h/o chronic HFrEF, NICM, permanent A fib, pulmonary JF, mild MR, mild-mod TR, htn, osa, seizure, COPD , HTN, and RBBB.  ?  ?Lost to  cardiology f/u in 2022.  ?  ?Admitted  04/2021 for a/c CHF in the setting of rapid afib. Echo showed EF 20-25%, RV mildly reduced, RVSP 48 mmHg, mild MR, mild TR. He was diuresed w/ IV Lasix. Afib treated w/ rate control, w/ metoprolol and digoxin. Amiodarone discontinued given chronic afib. V-rate improved in the 90s.  After diuresis, he was transition to PO torsemide. GDMT added. Instructed to f/u w/ EP post hospital and referred to [facility] clinic.  Wt charted at 227 lb.  ?  ?He was seen by Dr. Curt Bears . Plan is for CRT-D implant w/ plans to consider AV node ablation if further issues w/ rapid afib.  ?  ?Had initial TOC visit 05/04/2021. HF meds adjusted but he did not start them. Some medications are from mail order and others he picks up from [facility].   ?  ?Admitted 05/17/21 with AF RVR and A/C HFrEF.  Prior to admit he never picked up digoxin, Toprol, or jardiance. Diuresed with IV lasix.   Discharged on lopressor 100 mg twice a day, digoxin 0.25 mcg, and torsemide 40 mg twice a day. Discharge weight 214 pounds. Discharged 05/24/2021.  ?  ?He returned for HF follow up on 06/03/2021.Overall he was feeling fine, but did have SOB with exertion. He said he stays in the house unless he is going to a doctors appointment. Denied PND/Orthopnea. Appetite ok. He needed assistance getting food and had trouble with transportation as he no longer drives. Weight at home had been 220-222 pounds. He did not take his medications the morning before the appointment.  He lives with his wife, but she has dementia and requires 24 hour assistance. The neighbors were helping her out and Home Health  was following.  ? ?Today he returns to HF clinic for pharmacist medication titration. At last visit with NP spironolactone 12.5 mg daily was initiated and torsemide was increased from 40 to 60 mg BID. A digoxin level was checked and it was too high at 1.1, so digoxin 0.125 mg daily was subsequently discontinued.  ? ?He also had follow-up with general cardiology on 06/06/2021 and EP on 06/20/2021, however no medication changes were made. EP will re-evaluate him for a device in 3 months after his house is exterminated for bedbugs. He is seen by paramedicine and SW through [place] is helping him apply for Medicaid so that he and his wife can transition to a LTC facility.  ? ?Overall feeling ***. ?Dizziness, lightheadedness, fatigue:  ?Chest pain or palpitations: ? ?How is your breathing?: *** ?SOB: ?Able to complete all ADLs. Activity level *** ? ?Weight at home pounds. Takes torsemide *** mg *** daily.  ?LEE ?PND/Orthopnea ? ?Appetite *** ?Low-salt diet:  ? ?Physical Exam ?Cost/affordability of meds  ? ?HF Medications: ?Metoprolol tartrate 100 mg BID (plan to transition to succinate) ?Spironolactone 12.5 mg daily ?Torsemide 60 mg BID ? ?Has the patient been experiencing any side effects to the medications prescribed?  {YES NO:22349} ? ?Does the patient have any problems obtaining medications due to transportation or finances?   {YES NO:22349} ?Has [name] ?Cost  is an issue- entresto is not affordable ? ?Understanding of regimen: {excellent/good/fair/poor:19665} ?Understanding of indications: {excellent/good/fair/poor:19665} ?Potential of compliance: {excellent/good/fair/poor:19665} ?Patient understands to avoid NSAIDs. ?Patient understands to avoid decongestants. ?  ? ?Pertinent Lab Values: ?Labs 06/03/2021: Serum creatinine 1.02, BUN 25, Potassium 4.4, Sodium 140, Digoxin 1.1  ? ?Vital Signs: ?Weight: *** (last clinic weight: 229 lbs)  ?Blood pressure: *** 140/100> 120/80>102/70>100/74 ?Heart rate: *** 73,  70-80 ? ?Plan  ?BMET today none since spiro started ?A. Metop tartrate to succinate 100 bid- bid given AF ?B. Jardiance/farxiga 10 mg daily- PAN grant? ?C. Losartan 12.5 mg daily- bmet 2 weeks ?D. Arlyce Harman to 25- bmet 1 week ? ? ?Assessment/Plan: ?1. Chronic systolic CHF (EF ***), due to ***. NYHA class *** symptoms. ?1. Chronic Systolic Heart Failure ?- NICM. Dates back to at least 2018, EFs have fluctuated from 20-40% ?- most recent echo 04/2020 EF 20-25%, RV moderately reduced ?- LHC in 2021 showed normal coronaries ?- suspect tachy mediated CM from long history of chronic, poorly rate controlled Afib/flutter  ?- Planning CRT-D next month w/ Dr. Curt Bears. QRS 151 ms *** ?- Agree that he may benefit from AV nodal ablation, as he has not tolerated rapid Afib well, in setting of severe LV dysfunction  ?- NYHA Class III. Reds Clip 31%. Volume status trending up. Increase torsemide to 60 mg twice a day *** ?-Continue metoprolol 100 mg twice a day. I am not going switch to Toprol XL until we get a handle on his medications.  ?- - Start spiro 12.5 mg daily   ?- Start losartan next visit.  Not on Entresto due to cost ?- Digoxin 0.125 mg daily previously discontinued with level of 1.1   ?- Consider SGLT2i down the road.  ?  ?2. Permanent Atrial Fibrillation  ?- failed multiple cardioversions and AAD therapy  ?- issues w/ Rapid Afib in the past, complicating HF ?- Rate control w/ metoprolol and digoxin  ?- followed by Dr. Curt Bears w/ plans to implant CRT-D +/- AV nodal ablation possibly down the road. Procedure canceled due bed bugs back in March. Says it has been treated.  ?- on chronic coumadin. DOACs too expensive. INRs followed by coumadin clinic  ?  ?3. Pulmonary Hypertension  ?- suspect combination WHO Group 2 and 3 ?- PFTs in 2018 showed moderate OAD. Also w/ OSA  ?  ?4. OSA  ?- Needs to use CPAP daily .stressed importance of CPAP  ?  ?5. Essential HTN ?- Elevated.  Has not had meds today. ? ?Follow up *** 07/15/21 with  NP/PA.  ? ? ?Audry Riles, PharmD, BCPS, BCCP, CPP ?Heart Failure Clinic Pharmacist ?[phone] ?  ?

## 2021-06-29 ENCOUNTER — Telehealth (HOSPITAL_COMMUNITY): Payer: Self-pay | Admitting: Pharmacist

## 2021-06-29 ENCOUNTER — Other Ambulatory Visit (HOSPITAL_COMMUNITY): Payer: Self-pay

## 2021-06-29 ENCOUNTER — Ambulatory Visit (HOSPITAL_COMMUNITY)
Admission: RE | Admit: 2021-06-29 | Discharge: 2021-06-29 | Disposition: A | Discharge status: Home / Self Care | Payer: Medicare PPO | Source: Ambulatory Visit | Attending: Internal Medicine | Admitting: Internal Medicine

## 2021-06-29 VITALS — BP 98/64 | HR 84 | Wt 234.0 lb

## 2021-06-29 DIAGNOSIS — I10 Essential (primary) hypertension: Secondary | ICD-10-CM

## 2021-06-29 DIAGNOSIS — Z79899 Other long term (current) drug therapy: Secondary | ICD-10-CM | POA: Diagnosis not present

## 2021-06-29 DIAGNOSIS — I272 Pulmonary hypertension, unspecified: Secondary | ICD-10-CM | POA: Insufficient documentation

## 2021-06-29 DIAGNOSIS — Z7901 Long term (current) use of anticoagulants: Secondary | ICD-10-CM | POA: Insufficient documentation

## 2021-06-29 DIAGNOSIS — I5020 Unspecified systolic (congestive) heart failure: Secondary | ICD-10-CM

## 2021-06-29 DIAGNOSIS — G4733 Obstructive sleep apnea (adult) (pediatric): Secondary | ICD-10-CM | POA: Insufficient documentation

## 2021-06-29 DIAGNOSIS — I4821 Permanent atrial fibrillation: Secondary | ICD-10-CM

## 2021-06-29 DIAGNOSIS — I5022 Chronic systolic (congestive) heart failure: Secondary | ICD-10-CM | POA: Insufficient documentation

## 2021-06-29 DIAGNOSIS — I110 Hypertensive heart disease with heart failure: Secondary | ICD-10-CM | POA: Insufficient documentation

## 2021-06-29 DIAGNOSIS — I4510 Unspecified right bundle-branch block: Secondary | ICD-10-CM | POA: Diagnosis not present

## 2021-06-29 DIAGNOSIS — Z7984 Long term (current) use of oral hypoglycemic drugs: Secondary | ICD-10-CM | POA: Diagnosis not present

## 2021-06-29 DIAGNOSIS — I5042 Chronic combined systolic (congestive) and diastolic (congestive) heart failure: Secondary | ICD-10-CM

## 2021-06-29 DIAGNOSIS — I509 Heart failure, unspecified: Secondary | ICD-10-CM

## 2021-06-29 DIAGNOSIS — I428 Other cardiomyopathies: Secondary | ICD-10-CM | POA: Diagnosis not present

## 2021-06-29 LAB — BASIC METABOLIC PANEL
Anion gap: 9 (ref 5–15)
BUN: 20 mg/dL (ref 8–23)
CO2: 34 mmol/L — ABNORMAL HIGH (ref 22–32)
Calcium: 8.7 mg/dL — ABNORMAL LOW (ref 8.9–10.3)
Chloride: 97 mmol/L — ABNORMAL LOW (ref 98–111)
Creatinine, Ser: 1.32 mg/dL — ABNORMAL HIGH (ref 0.61–1.24)
GFR, Estimated: 55 mL/min — ABNORMAL LOW (ref >60)
Glucose, Bld: 96 mg/dL (ref 70–99)
Potassium: 3.3 mmol/L — ABNORMAL LOW (ref 3.5–5.1)
Sodium: 140 mmol/L (ref 135–145)

## 2021-06-29 LAB — BRAIN NATRIURETIC PEPTIDE: B Natriuretic Peptide: 504.9 pg/mL — ABNORMAL HIGH (ref 0.0–100.0)

## 2021-06-29 MED ORDER — TORSEMIDE 20 MG PO TABS: 60.0000 mg | ORAL_TABLET | Freq: Two times a day (BID) | ORAL | 3 refills | Status: DC

## 2021-06-29 MED ORDER — EMPAGLIFLOZIN 10 MG PO TABS: 10.0000 mg | ORAL_TABLET | Freq: Every day | ORAL | 3 refills | Status: DC

## 2021-06-29 MED ORDER — POTASSIUM CHLORIDE CRYS ER 20 MEQ PO TBCR: 20.0000 meq | EXTENDED_RELEASE_TABLET | Freq: Every day | ORAL | 3 refills | Status: DC

## 2021-06-29 NOTE — Progress Notes (Signed)
Paramedicine Encounter ? ? ? Patient ID: Ruben Murray, male    DOB: 01/29/1946, 76 y.o.   MRN: [id number] ? ?Met with Ruben Murray in clinic today where he was seen by Pharmacy team. Vitals were obtained and assessment completed. Meds were reviewed and confirmed.  ? ?Med changes were made and labs were obtained.  ? ?Med changes- ? ?ADD JARDIANCE  ?REDUCE EVENING TORSEMIDE TO 40MG ? ?Labs came back and Ruben Murray advised to hold evening Torsemide tonight and to start Potassium 20MEQ daily. We will repeat labs on 5/12 clinic visit.  ? ?[employer] Grant $10,000 for [facility] for [facility] copay.  ? ?Pill box was filled accordingly.  ? ?I shared the update after labs with his caregiver/neighbor Ruben Murray and she is going to remove tonight's Torsemide and add Jardiance and potassium once picked up from pharmacy today.  ? ?I will follow up in the home in one week on Tuesday.  ? ? ?ACTION: ?Home visit completed ? ? ? ? ? ? ?

## 2021-06-29 NOTE — Patient Instructions (Signed)
It was a pleasure seeing you today! ? ?MEDICATIONS: ?-We are changing your medications today ?-Start Jardiance 10 mg (1 tablet) daily ?-Decrease torsemide to 60 mg (3 tablets) in the morning and 40 mg (2 tablets) in the evening ?-Call if you have questions about your medications. ? ?LABS: ?-We will call you if your labs need attention. ? ?NEXT APPOINTMENT: ?Return to clinic in 07/15/2021 with NP/PA. ? ?In general, to take care of your heart failure: ?-Limit your fluid intake to 2 Liters (half-gallon) per day.   ?-Limit your salt intake to ideally 2-3 grams (2000-3000 mg) per day. ?-Weigh yourself daily and record, and bring that "weight diary" to your next appointment.  (Weight gain of 2-3 pounds in 1 day typically means fluid weight.) ?-The medications for your heart are to help your heart and help you live longer.   ?-Please contact us before stopping any of your heart medications. ? ?Call the clinic at [phone] with questions or to reschedule future appointments.  ?

## 2021-06-29 NOTE — Telephone Encounter (Signed)
Patient Advocate Encounter ? ?Was successful in securing patient an $10,000 grant from [employer] to provide copayment coverage for [place].  This will keep the out of pocket expense at $0.   ?  ?I have spoken with the patient.   ? ?The billing information is as follows and has been shared with his pharmacy. ? ?Member ID: [id number] ?Group ID: [id number] ?RxBin: [id number] ?PCN: [id number] ?Dates of Eligibility: 05/30/21 through 05/30/22 ? ?Audry Riles, PharmD, BCPS, BCCP, CPP ?Heart Failure Clinic Pharmacist ?[phone]' ?

## 2021-06-29 NOTE — Progress Notes (Signed)
?  ?Advanced Heart Failure Clinic Note  ? ?Referring Physician: Dr Marisue Ivan ?Primary Care: Dr Mannie Stabile  ?Primary Cardiologist: Dr [name] ?EP : Dr Curt Bears ?HF Cardiologist: Dr. Haroldine Laws ?  ?HPI: ?Mr Minner is a 76 year old with a h/o chronic HFrEF, NICM, permanent A fib, mild MR, mild-mod TR, HTN, OSA, seizure, COPD, HTN, and RBBB.  ?  ?Lost to cardiology f/u in 2022.  ?  ?Admitted  04/2021 for a/c CHF in the setting of rapid afib. Echo showed EF 20-25%, RV mildly reduced, RVSP 48 mmHg, mild MR, mild TR. He was diuresed w/ IV Lasix. Afib treated with rate control and started on metoprolol and digoxin. Amiodarone discontinued given chronic afib. V-rate improved in the 90s.  After diuresis, he was transitioned to PO torsemide. GDMT added. Instructed to f/u with EP post hospital and referred to [facility] clinic.  Wt charted at 227 lbs.  ?  ?He was seen by Dr. Curt Bears. Plan is for CRT-D implant w/ plans to consider AV node ablation if further issues with rapid afib.  ?  ?Had initial TOC visit 05/04/2021. HF meds adjusted but he did not start them. Some medications are from mail order and others [facility]. Admitted 05/17/21 with AF RVR and A/C HFrEF.  Prior to admit he never picked up digoxin, metoprolol, or Jardiance. Diuresed with IV Lasix.   Discharged on metoprolol tartrate 100 mg twice a day, digoxin 0.25 mg daily, and torsemide 40 mg twice a day. Discharge weight 214 pounds. Discharged 05/24/2021.  ?  ?He returned for HF follow up on 06/03/2021. Overall he was feeling fine, but did have SOB with exertion. He said he stays in the house unless he is going to a doctors appointment. Denied PND/Orthopnea. Appetite ok. He needed assistance getting food and had trouble with transportation as he no longer drives. Weight at home had been 220-222 pounds. He did not take his medications the morning of the appointment.  He lives with his wife, but she has dementia and requires 24 hour assistance. The neighbors were helping  her out and Home Health was following.  ? ?Today he returns to HF clinic for pharmacist medication titration. At last visit with NP, spironolactone 12.5 mg daily was initiated and torsemide was increased from 40 to 60 mg BID. A digoxin level was checked and it was elevated at 1.1, so digoxin was subsequently discontinued. He also had follow-up with general cardiology on 06/06/2021 and EP on 06/20/2021, however no medication changes were made. EP will re-evaluate him for a device in 3 months after his house is exterminated for bedbugs. He is seen by paramedicine and SW through [place] is helping him apply for Medicaid so that he and his wife can transition to a LTC facility.  ? ?Overall he is feeling okay today. He occassionally gets dizzy or lightheaded if he gets up too fast, but this lasts a second or two before going away. He denies chest pain but does have palpitations that he can feel with his afib. He is extremely fatigued, however he feels this is unchanged from last visit and notes that taking care of his wife, who has 2 broken hips, has been challenging for him on top of taking care of himself. He gets SOB with this and needs to take a break every ~5 minutes to catch his breath. His breathing has been stable for him and he is doing the best he can to complete all his ADLs. He uses  a walker at home with seat so can sit down as needed. His neighbor has been a big help providing food for him and his wife and he is set up with Meals on Wheels. He weighs himself daily at home and his weight has been stable at ~220 lbs. He takes torsemide 60 mg BID and is followed by paramedicine. He has not missed any doses of his medications. No LEE on exam. He denies PND or orthopnea, however he sleeps in a recliner due to a back problem and has done so for years. His appetite has been stable. He avoids adding adding extra salt to his food.  ? ?HF Medications: ?Metoprolol tartrate 100 mg BID (plan to transition to succinate  eventually) ?Spironolactone 12.5 mg daily ?Torsemide 60 mg BID ? ?Has the patient been experiencing any side effects to the medications prescribed? No ? ?Does the patient have any problems obtaining medications due to transportation or finances? ?Has [employer].  ?He has a Radio producer for [employer] ?Followed by paramedicine ?He no longer drives and relies on his neighbor for transportation.  ? ?Understanding of regimen: poor ?Understanding of indications: poor ?Potential of compliance: excellent with help of paramedicine ?Patient understands to avoid NSAIDs. ?Patient understands to avoid decongestants. ?  ? ?Pertinent Lab Values: ?Labs 06/03/2021: Serum creatinine 1.02, BUN 25, Potassium 4.4, Sodium 140, Digoxin 1.1  ?Labs 06/29/2021: Serum creatine 1.32, BUN 20, Potassium 3.3, Sodium 140, BNP 504.9 ? ?Vital Signs: ?Weight: 234 lbs (last clinic weight: 229 lbs)  ?Blood pressure: 98/64 mmHg ?Heart rate: 84 bpm ? ?Assessment/Plan: ?1. Chronic Systolic Heart Failure ?- NICM. Dates back to at least 2018, EFs have fluctuated from 20-40% ?- most recent echo 04/2020 EF 20-25%, RV moderately reduced ?- LHC in 2021 showed normal coronaries ?- suspect tachy mediated CM from long history of chronic, poorly rate controlled Afib/flutter  ?- Planning CRT-D in the future w/ Dr. Curt Bears. QRS 151 ms ?-He may benefit from AV nodal ablation, as he has not tolerated rapid Afib well, in setting of severe LV dysfunction  ?-Labs resulted after the visit indicating increased Scr and low potassium with previous diuretic increase. Changes as below discussed with paramedicine.  ?-NYHA Class III. Volume status dry on exam. Hold torsemide dose tonight then decrease torsemide to 60 mg in the morning and 40 mg in the evening.  ?-K 3.3. Start potassium chloride 20 mEq daily. Repeat BMET 07/15/21. ?-Continue metoprolol tartrate 100 mg twice a day. Plan to transition to metoprolol succinate once stable.   ?-Continue spironolactone 12.5 mg daily    ?-Start Jardiance 10 mg daily ?- Digoxin previously discontinued with level of 1.1   ?  ?2. Permanent Atrial Fibrillation  ?- failed multiple cardioversions and AAD therapy  ?- issues with Rapid Afib in the past, complicating HF ?- Rate controled with metoprolol ?- followed by Dr. Curt Bears w/ plans to implant CRT-D +/- AV nodal ablation possibly down the road. Procedure canceled due bed bugs back in March ?-On chronic coumadin due to drug interactions with phenobarbital and DOACs too expensive. INRs followed by coumadin clinic  ?  ?3. Pulmonary Hypertension  ?- suspect combination WHO Group 2 and 3 ?- PFTs in 2018 showed moderate OAD. Also with OSA  ?  ?4. OSA  ?-Continue to use CPAP daily ?  ?5. Essential HTN ?-Low BP in clinic today. Controlled on current medications. ? ?Follow up 07/15/2021 with [facility]. ? ?Audry Riles, PharmD, BCPS, BCCP, CPP ?Heart Failure Clinic Pharmacist ?[phone] ?  ?

## 2021-07-05 ENCOUNTER — Other Ambulatory Visit (HOSPITAL_COMMUNITY): Payer: Self-pay

## 2021-07-05 NOTE — Progress Notes (Signed)
Paramedicine Encounter ? ? ? Patient ID: Ruben Murray, male    DOB: 04-Dec-1945, 76 y.o.   MRN: [id number] ? ?Arrived for home visit for Ruben Murray who reports feeling okay just tired. He states he has been doing a lot of work around his apartment and caring for his wife as her caregiver has been sick the last week. He states yesterday he was moving quickly and tripped and fell two times. He did not obtain any injuries from this and reports he feels fine now. He does report that he gets dizzy when he gets up too quickly, I reminded him to make sure he moves slower and takes his time and watches where his footing is. He agreed with this plan.  ? ?Vitals and assessment were obtained and confirmed. No lower leg swelling noted. Weight is down this week. Lungs clear.  ? ?Medications were reviewed and confirmed and I filled one week of medications in his pill box. He is missing Lexapro Weds Bedtime-Mon Bedtime. I will call PCP [facility]  ? ?Ova and I spoke at length today as he feels his niece is trying to push him and his wife into a nursing home. He reports she has medical POA which is his niece. No documentation noted in his chart of same. I advised him that currently he is alert and oriented X4 and makes his own decisions and that if he did not want to be transitioned into a nursing home he does not have to be. He agreed and was grateful for the conversation. We also discussed medicaid and he reports his PCP was to be assisting with signing him up for this. I will reach out to PCP office to discuss with them and I provided him and his caregiver [name] the number to [place] to see if they have any note of him applying or not. They agreed with plan.  ? ?He also expressed the need for transportation options which I advised his insurance plans have limited resources but he could sign up for [employer]. I will assist with this and make Jenna LCSW at HF clinic aware.  ? ?Home visit complete. I plan  to see Ruben Murray in one week. He agreed with plan.  ? ?Refills: ?Lexapro ? ? ?Patient Care Team: ?[name], MD as PCP - General (Family Medicine) ?[name], MD as PCP - Cardiology (Cardiology) ?[name], MD as PCP - Electrophysiology (Cardiology) ?[name], MD (Family Medicine) ? ?Patient Active Problem List  ? Diagnosis Date Noted  ? Acute on chronic systolic CHF (congestive heart failure) ([place]) 05/17/2021  ? Hypercoagulable state ([place]) 05/10/2021  ? Left leg pain 04/24/2021  ? OSA (obstructive sleep apnea) 04/24/2021  ? Mitral regurgitation 04/24/2021  ? Tricuspid regurgitation 04/24/2021  ? Dilated aortic root ([place]) 04/24/2021  ? Permanent atrial fibrillation ([place]) 04/19/2021  ? Chronic arthropathy 04/13/2020  ? Callus 04/13/2020  ? Onychomycosis 12/12/2019  ? Left leg cellulitis 12/12/2019  ? Cellulitis 12/09/2019  ? Dilated cardiomyopathy ([place])   ? Atrial flutter ([place]) 11/03/2019  ? Atrial flutter with rapid ventricular response ([place]) 11/03/2019  ? Acquired thrombophilia ([place]) 01/15/2019  ? Chronic combined systolic and diastolic heart failure ([place]) 10/29/2017  ? CHF (congestive heart failure) ([place]) 10/29/2017  ? Prolonged QT interval 10/29/2017  ? Sensorineural hearing loss (SNHL) of both ears 01/31/2017  ? Dysphonia 10/03/2016  ? Laryngopharyngeal reflux (LPR) 10/03/2016  ? Nasal polyps 10/03/2016  ? Rhinitis medicamentosa 10/03/2016  ?  Chronic laryngitis 10/03/2016  ? Hoarseness 08/31/2016  ? Moderate persistent asthma 08/02/2016  ? COPD GOLD II  08/02/2016  ? Morbid (severe) obesity due to excess calories ([place]) 08/02/2016  ? CAP (community acquired pneumonia) 03/07/2016  ? Community acquired pneumonia 03/06/2016  ? Essential hypertension 03/06/2016  ? Hyperlipidemia 03/06/2016  ? Right thyroid nodule 03/06/2016  ? Seizures ([place]) 03/06/2016  ? Fever 03/12/2015  ? Recurrent erosion of cornea 06/23/2011  ? ? ?Current Outpatient Medications:  ?  acetaminophen (TYLENOL) 500 MG tablet,  Take 1,000 mg by mouth at bedtime as needed for mild pain or headache., Disp: , Rfl:  ?  Carboxymethylcellulose Sod PF 0.5 % SOLN, Place 1 drop into both eyes 4 (four) times daily as needed (dry eyes)., Disp: , Rfl:  ?  Cholecalciferol (VITAMIN D3) 25 MCG (1000 UT) CAPS, Take 1,000 Units by mouth daily., Disp: , Rfl:  ?  empagliflozin (JARDIANCE) 10 MG TABS tablet, Take 1 tablet (10 mg total) by mouth daily before breakfast., Disp: 90 tablet, Rfl: 3 ?  escitalopram (LEXAPRO) 20 MG tablet, Take 20 mg by mouth daily. Takes at bedtime., Disp: , Rfl:  ?  finasteride (PROSCAR) 5 MG tablet, Take 5 mg by mouth daily at 12 noon. , Disp: , Rfl:  ?  fluticasone (FLONASE) 50 MCG/ACT nasal spray, Place 1 spray into both nostrils 2 (two) times daily., Disp: , Rfl:  ?  loratadine (CLARITIN) 10 MG tablet, Take 10 mg by mouth daily., Disp: , Rfl:  ?  metoprolol tartrate (LOPRESSOR) 100 MG tablet, Take 1 tablet (100 mg total) by mouth 2 (two) times daily., Disp: 180 tablet, Rfl: 2 ?  Multiple Vitamin (MULTIVITAMIN WITH MINERALS) TABS tablet, Take 1 tablet by mouth daily at 12 noon., Disp: , Rfl:  ?  PHENobarbital (LUMINAL) 97.2 MG tablet, Take 97.2 mg by mouth daily. Takes between 3:00-5:00pm., Disp: , Rfl:  ?  potassium chloride SA (KLOR-CON M) 20 MEQ tablet, Take 1 tablet (20 mEq total) by mouth daily., Disp: 90 tablet, Rfl: 3 ?  pravastatin (PRAVACHOL) 40 MG tablet, Take 40 mg by mouth at bedtime. , Disp: , Rfl:  ?  spironolactone (ALDACTONE) 25 MG tablet, Take 0.5 tablets (12.5 mg total) by mouth daily., Disp: 45 tablet, Rfl: 2 ?  torsemide (DEMADEX) 20 MG tablet, Take 3 tablets (60 mg total) by mouth 2 (two) times daily. Take 3 tablets (60 mg total) in the morning and 2 tablets (40 mg total) in the evening, Disp: 450 tablet, Rfl: 3 ?  triamcinolone cream (KENALOG) 0.1 %, Apply 1 application. topically 2 (two) times daily., Disp: , Rfl:  ?  warfarin (COUMADIN) 5 MG tablet, Take 5 mg by mouth daily., Disp: , Rfl:  ?Allergies   ?Allergen Reactions  ? Dilaudid [Hydromorphone Hcl] Other (See Comments)  ?  Makes pt hyper   ? Hydromorphone Other (See Comments)  ?  hyperactiviity ?Other reaction(s): made him wild ?Other reaction(s): Unknown  ? Morphine Sulfate   ?  Other reaction(s): at high dose causes confusion  ? Tegretol [Carbamazepine] Hives  ? Carbamazepine Hives and Rash  ?  Other reaction(s): hives ?Other reaction(s): Hives  ? ? ? ?Social History  ? ?Socioeconomic History  ? Marital status: Married  ?  Spouse name: Not on file  ? Number of children: Not on file  ? Years of education: Not on file  ? Highest education level: Not on file  ?Occupational History  ? Not on file  ?Tobacco Use  ?  Smoking status: Former  ?  Packs/day: 3.00  ?  Years: 48.00  ?  Pack years: 144.00  ?  Types: Cigarettes  ?  Quit date: 2000  ?  Years since quitting: 23.3  ? Smokeless tobacco: Never  ?Vaping Use  ? Vaping Use: Never used  ?Substance and Sexual Activity  ? Alcohol use: Never  ? Drug use: Never  ? Sexual activity: Never  ?Other Topics Concern  ? Not on file  ?Social History Narrative  ? ** Merged History Encounter **  ?    ? ?Social Determinants of Health  ? ?Financial Resource Strain: Not on file  ?Food Insecurity: No Food Insecurity  ? Worried About Charity fundraiser in the Last Year: Never true  ? Ran Out of Food in the Last Year: Never true  ?Transportation Needs: No Transportation Needs  ? Lack of Transportation (Medical): No  ? Lack of Transportation (Non-Medical): No  ?Physical Activity: Not on file  ?Stress: Not on file  ?Social Connections: Not on file  ?Intimate Partner Violence: Not on file  ? ? ?Physical Exam ?Vitals reviewed.  ?Constitutional:   ?   Appearance: Normal appearance. He is normal weight.  ?HENT:  ?   Head: Normocephalic.  ?   Nose: Nose normal.  ?   Mouth/Throat:  ?   Mouth: Mucous membranes are moist.  ?   Pharynx: Oropharynx is clear.  ?Eyes:  ?   Conjunctiva/sclera: Conjunctivae normal.  ?   Pupils: Pupils are equal,  round, and reactive to light.  ?Cardiovascular:  ?   Rate and Rhythm: Normal rate and regular rhythm.  ?   Pulses: Normal pulses.  ?   Heart sounds: Normal heart sounds.  ?Pulmonary:  ?   [place]: Pulmonary

## 2021-07-06 ENCOUNTER — Telehealth (HOSPITAL_COMMUNITY): Payer: Self-pay

## 2021-07-06 NOTE — Telephone Encounter (Signed)
Phone call received from Mr. Manfred neighbor confirming medications with his NP from his PCP office. We went through his medication list and confirmed and verified all medications.  ? ?[name] also informed me that his niece had called [place] [facility] to begin an FL2 form to attempt to start looking at SNF placement for him and his wife. Mr. Maslow reports he does not want to go to a nursing home and feels safer and comfortable at home. I will report this to Jenna LCSW at HF clinic to make her aware of same.  ? ?Call complete. I will follow up in home next week and [name] reports she will call me with any updates she learns about.  ?

## 2021-07-12 ENCOUNTER — Other Ambulatory Visit (HOSPITAL_COMMUNITY): Payer: Self-pay

## 2021-07-12 NOTE — Progress Notes (Signed)
Paramedicine Encounter ? ? ? Patient ID: Ruben Murray, male    DOB: 01-Feb-1946, 76 y.o.   MRN: [id number] ? ? ?Arrived for home visit for Ruben Murray who reports feeling good with no complaints today. He states he has no shortness of breath and states he was able to do some household chores yesterday without getting too short of breath or fatigued.  ? ?Assessment and vitals obtained.  ? ?Some lower leg edema noted. His weight is up 3lbs from yesterday. I reviewed fluid restrictions and low sodium diet to Ruben Murray and he reports understanding.  ? ?Medications were reviewed and confirmed. Pill box filled accordingly. One extra Torsemide given with morning medications due to swelling and weight gain.  ? ?Appointments were reviewed and confirmed. He agreed with home visit in one week.  ? ?Home visit complete.  ? ?Refills: NONE ? ? ? ? ? ? ? ?Patient Care Team: ?[name], MD as PCP - General (Family Medicine) ?[name], MD as PCP - Cardiology (Cardiology) ?[name], MD as PCP - Electrophysiology (Cardiology) ?[name], MD (Family Medicine) ? ?Patient Active Problem List  ? Diagnosis Date Noted  ? Acute on chronic systolic CHF (congestive heart failure) ([place]) 05/17/2021  ? Hypercoagulable state ([place]) 05/10/2021  ? Left leg pain 04/24/2021  ? OSA (obstructive sleep apnea) 04/24/2021  ? Mitral regurgitation 04/24/2021  ? Tricuspid regurgitation 04/24/2021  ? Dilated aortic root ([place]) 04/24/2021  ? Permanent atrial fibrillation ([place]) 04/19/2021  ? Chronic arthropathy 04/13/2020  ? Callus 04/13/2020  ? Onychomycosis 12/12/2019  ? Left leg cellulitis 12/12/2019  ? Cellulitis 12/09/2019  ? Dilated cardiomyopathy ([place])   ? Atrial flutter ([place]) 11/03/2019  ? Atrial flutter with rapid ventricular response ([place]) 11/03/2019  ? Acquired thrombophilia ([place]) 01/15/2019  ? Chronic combined systolic and diastolic heart failure ([place]) 10/29/2017  ? CHF (congestive heart failure) ([place]) 10/29/2017  ? Prolonged  QT interval 10/29/2017  ? Sensorineural hearing loss (SNHL) of both ears 01/31/2017  ? Dysphonia 10/03/2016  ? Laryngopharyngeal reflux (LPR) 10/03/2016  ? Nasal polyps 10/03/2016  ? Rhinitis medicamentosa 10/03/2016  ? Chronic laryngitis 10/03/2016  ? Hoarseness 08/31/2016  ? Moderate persistent asthma 08/02/2016  ? COPD GOLD II  08/02/2016  ? Morbid (severe) obesity due to excess calories ([place]) 08/02/2016  ? CAP (community acquired pneumonia) 03/07/2016  ? Community acquired pneumonia 03/06/2016  ? Essential hypertension 03/06/2016  ? Hyperlipidemia 03/06/2016  ? Right thyroid nodule 03/06/2016  ? Seizures ([place]) 03/06/2016  ? Fever 03/12/2015  ? Recurrent erosion of cornea 06/23/2011  ? ? ?Current Outpatient Medications:  ?  acetaminophen (TYLENOL) 500 MG tablet, Take 1,000 mg by mouth at bedtime as needed for mild pain or headache., Disp: , Rfl:  ?  Carboxymethylcellulose Sod PF 0.5 % SOLN, Place 1 drop into both eyes 4 (four) times daily as needed (dry eyes)., Disp: , Rfl:  ?  Cholecalciferol (VITAMIN D3) 25 MCG (1000 UT) CAPS, Take 1,000 Units by mouth daily., Disp: , Rfl:  ?  empagliflozin (JARDIANCE) 10 MG TABS tablet, Take 1 tablet (10 mg total) by mouth daily before breakfast., Disp: 90 tablet, Rfl: 3 ?  escitalopram (LEXAPRO) 20 MG tablet, Take 20 mg by mouth daily. Takes at bedtime., Disp: , Rfl:  ?  finasteride (PROSCAR) 5 MG tablet, Take 5 mg by mouth daily at 12 noon. , Disp: , Rfl:  ?  fluticasone (FLONASE) 50 MCG/ACT nasal spray, Place 1 spray into both nostrils 2 (two) times  daily., Disp: , Rfl:  ?  loratadine (CLARITIN) 10 MG tablet, Take 10 mg by mouth daily., Disp: , Rfl:  ?  metoprolol tartrate (LOPRESSOR) 100 MG tablet, Take 1 tablet (100 mg total) by mouth 2 (two) times daily., Disp: 180 tablet, Rfl: 2 ?  Multiple Vitamin (MULTIVITAMIN WITH MINERALS) TABS tablet, Take 1 tablet by mouth daily at 12 noon., Disp: , Rfl:  ?  PHENobarbital (LUMINAL) 97.2 MG tablet, Take 97.2 mg by mouth daily.  Takes between 3:00-5:00pm., Disp: , Rfl:  ?  potassium chloride SA (KLOR-CON M) 20 MEQ tablet, Take 1 tablet (20 mEq total) by mouth daily., Disp: 90 tablet, Rfl: 3 ?  pravastatin (PRAVACHOL) 40 MG tablet, Take 40 mg by mouth at bedtime. , Disp: , Rfl:  ?  spironolactone (ALDACTONE) 25 MG tablet, Take 0.5 tablets (12.5 mg total) by mouth daily., Disp: 45 tablet, Rfl: 2 ?  torsemide (DEMADEX) 20 MG tablet, Take 3 tablets (60 mg total) by mouth 2 (two) times daily. Take 3 tablets (60 mg total) in the morning and 2 tablets (40 mg total) in the evening, Disp: 450 tablet, Rfl: 3 ?  triamcinolone cream (KENALOG) 0.1 %, Apply 1 application. topically 2 (two) times daily., Disp: , Rfl:  ?  warfarin (COUMADIN) 5 MG tablet, Take 5 mg by mouth daily., Disp: , Rfl:  ?Allergies  ?Allergen Reactions  ? Dilaudid [Hydromorphone Hcl] Other (See Comments)  ?  Makes pt hyper   ? Hydromorphone Other (See Comments)  ?  hyperactiviity ?Other reaction(s): made him wild ?Other reaction(s): Unknown  ? Morphine Sulfate   ?  Other reaction(s): at high dose causes confusion  ? Tegretol [Carbamazepine] Hives  ? Carbamazepine Hives and Rash  ?  Other reaction(s): hives ?Other reaction(s): Hives  ? ? ? ?Social History  ? ?Socioeconomic History  ? Marital status: Married  ?  Spouse name: Not on file  ? Number of children: Not on file  ? Years of education: Not on file  ? Highest education level: Not on file  ?Occupational History  ? Not on file  ?Tobacco Use  ? Smoking status: Former  ?  Packs/day: 3.00  ?  Years: 48.00  ?  Pack years: 144.00  ?  Types: Cigarettes  ?  Quit date: 2000  ?  Years since quitting: 23.3  ? Smokeless tobacco: Never  ?Vaping Use  ? Vaping Use: Never used  ?Substance and Sexual Activity  ? Alcohol use: Never  ? Drug use: Never  ? Sexual activity: Never  ?Other Topics Concern  ? Not on file  ?Social History Narrative  ? ** Merged History Encounter **  ?    ? ?Social Determinants of Health  ? ?Financial Resource Strain: Not  on file  ?Food Insecurity: No Food Insecurity  ? Worried About Charity fundraiser in the Last Year: Never true  ? Ran Out of Food in the Last Year: Never true  ?Transportation Needs: No Transportation Needs  ? Lack of Transportation (Medical): No  ? Lack of Transportation (Non-Medical): No  ?Physical Activity: Not on file  ?Stress: Not on file  ?Social Connections: Not on file  ?Intimate Partner Violence: Not on file  ? ? ?Physical Exam ? ? ? ? ? ?Future Appointments  ?Date Time Provider [place]  ?07/15/2021 11:00 AM [facility] PA/NP [facility] None  ?09/27/2021 11:20 AM [place], PA-C [facility]  ?11/14/2021  3:00 PM [name], MD [facility]  ? ? ? ?  ACTION: ?Home visit completed ? ? ? ? ? ? ?

## 2021-07-13 NOTE — Progress Notes (Signed)
? ?ADVANCED HF CLINIC CONSULT NOTE ? ?Referring Provider: Darrick Grinder, NP ?Primary Care: [name], MD ?HF Cardiologist: Dr. Haroldine Laws ? ?HPI: ?Mr Ruben Murray is a 76 year old with a h/o chronic HFrEF, NICM, permanent A fib, pulmonary JF, mild MR, mild-mod TR, htn, osa, seizure, COPD , HTN, and RBBB.  ?  ?Lost to cardiology f/u 2022.  ?  ?Admitted  04/2021 for a/c CHF in the setting of rapid afib. Echo showed EF 20-25%, RV mildly reduced, RVSP 48 mmHg, mild MR, mild TR. He was diuresed w/ IV Lasix. Afib treated w/ rate control, w/ metoprolol and digoxin. Amiodarone discontinued given chronic afib. V-rate improved in the 90s.  After diuresis, he was transition to PO torsemide. GDMT added. Instructed to f/u w/ EP post hospital and referred to [facility] clinic.  Wt charted at 227 lb.  ?  ?He was seen by Dr. Curt Bears . Plan is for CRT-D implant w/ plans to consider AV node ablation if further issues w/ rapid afib.  ?  ?Had initial TOC visit 05/04/2021. HF meds adjust but he did not start them. Some medications are from mail order and others he picks up from [facility].   ?  ?Admitted 05/17/21 with AF RVR and A/C HFrEF.  Prior to admit he never picked up digoxin, Toprol, or jardiance. Diuresed with IV lasix.   Discharged on lopressor 100 mg twice a day, digoxin 0.25 mcg, and torsemide 40 mg twice a day. Discharge weight 214 pounds. Discharged 05/24/21.  ?  ?Seen in [facility] 3/23, volume trending up and torsemide increased and spiro started. Referred to paramedicine. ? ?Today he returns for HF follow up with his neighbor. Overall feeling more SOBwalking short distances. Denies palpitations, CP, dizziness, edema, or PND/Orthopnea. Appetite ok. No fever or chills. Weight at home 220-230 pounds. Taking all medications. No longer drives and needs transportation assistance. Lives with his wife. His wife has dementia and requires 24 hour assistance. Neighbors are helping her out. Home Health following.  ? ?  ?Cardiac Testing  ?Echo 04/2016 EF  40-45%, RV normal, mod TR ?Echo 10/2017 EF 25%, RV moderately reduced (in Afib) ?Echo 03/2018 EF 40-45%, RV mildly dilated, systolic fx normal  ?Echo 10/2019 EF 20-25%, RV moderately reduced, RVSP 38, Mild MR, mild TR ?LHC 11/2019 Normal coronaries, RHC mild pulmonary hypertension; PA 34/22, mean PA pressure 28 mmHg  ?Echo 04/2021 EF 20-25%, RV mildly reduced, RVSP 48, mild MR, mild TR  ? ?Review of Systems: [y] = yes, _0  = no  ? ?General: Weight gain _1 ; Weight loss _2 ; Anorexia _3 ; Fatigue _4 ; Fever _5 ; Chills _6 ; Weakness _7   ?Cardiac: Chest pain/pressure _8 ; Resting SOB _9 ; Exertional SOB Blue.Reese ]; Orthopnea _10 ; Pedal Edema _11 ; Palpitations _12 ; Syncope _13 ; Presyncope _14 ; Paroxysmal nocturnal dyspnea_15   ?Pulmonary: Cough _16 ; Wheezing_17 ; Hemoptysis_18 ; Sputum _19 ; Snoring _20   ?GI: Vomiting_21 ; Dysphagia_22 ; Melena_23 ; Hematochezia _24 ; Heartburn_25 ; Abdominal pain _26 ; Constipation _27 ; Diarrhea _28 ; BRBPR _29   ?GU: Hematuria_30 ; Dysuria _31 ; Nocturia_32   ?Vascular: Pain in legs with walking _33 ; Pain in feet with lying flat _34 ; Non-healing sores _35 ; Stroke _36 ; TIA _37 ; Slurred speech _38 ;  ?Neuro: Headaches_39 ; Vertigo_40 ; Seizures_41 ; Paresthesias_42 ;Blurred vision _43 ; Diplopia _44 ; Vision changes _45   ?Ortho/Skin: Arthritis _46 ; Joint pain _47 ;  Muscle pain _0 ; Joint swelling _1 ; Back Pain _2 ; Rash _3   ?Psych: Depression_4 ; Anxiety_5   ?Heme: Bleeding problems _6 ; Clotting disorders _7 ; Anemia _8   ?Endocrine: Diabetes _9 ; Thyroid dysfunction_10  ? ?Past Medical History:  ?Diagnosis Date  ? Acquired thrombophilia ([place]) 01/15/2019  ? Aortic atherosclerosis ([place])   ? Aortic root dilatation ([facility])   ? CAP (community acquired pneumonia) 03/06/2016  ? Chronic laryngitis 10/03/2016  ? Chronic systolic CHF (congestive heart failure) ([place])   ? COPD GOLD II  08/02/2016  ? Quit smoking 2000  PFT's  07/10/2016  FEV1 1.98 (70 % ) ratio 67  p 19 % improvement from saba p nothing  prior to study while of  coreg so rec as of 08/02/2016 try off coreg and on bisoprolol     ? Essential hypertension 03/06/2016  ? Changed from coreg to bisoprolol due to copd with reversible component  08/02/2016 >>>   ? Hoarseness 08/31/2016  ? Referred to ent 08/31/2016 >>> seen 10/03/16 [name] dx gerd and rhinitis medicamentosa   > improved on f/u 01/31/17 on gerd rx/ flonase and off afrin  ? Hyperlipidemia   ? Hypertension   ? Laryngopharyngeal reflux (LPR) 10/03/2016  ? Mitral regurgitation   ? Moderate persistent asthma 08/02/2016  ? Morbid (severe) obesity due to excess calories ([place]) 08/02/2016  ? Nasal polyps 10/03/2016  ? NICM (nonischemic cardiomyopathy) ([place])   ? a. EF 40-45% in 04/2016, etiology not defined, managed medically.  ? OSA on CPAP   ? Permanent atrial fibrillation ([place])   ? Prolonged QT interval 10/29/2017  ? RBBB   ? Recurrent erosion of cornea 06/23/2011  ? Rhinitis medicamentosa 10/03/2016  ? Right thyroid nodule 03/06/2016  ? Seizures ([place])   ? "take RX daily" (03/06/2016)  ? Sensorineural hearing loss (SNHL) of both ears 01/31/2017  ? Tricuspid regurgitation   ? ?Current Outpatient Medications  ?Medication Sig Dispense Refill  ? acetaminophen (TYLENOL) 500 MG tablet Take 1,000 mg by mouth at bedtime as needed for mild pain or headache.    ? Carboxymethylcellulose Sod PF 0.5 % SOLN Place 1 drop into both eyes 4 (four) times daily as needed (dry eyes).    ? Cholecalciferol (VITAMIN D3) 25 MCG (1000 UT) CAPS Take 1,000 Units by mouth daily.    ? empagliflozin (JARDIANCE) 10 MG TABS tablet Take 1 tablet (10 mg total) by mouth daily before breakfast. 90 tablet 3  ? escitalopram (LEXAPRO) 20 MG tablet Take 20 mg by mouth daily. Takes at bedtime.    ? finasteride (PROSCAR) 5 MG tablet Take 5 mg by mouth daily at 12 noon.     ? fluticasone (FLONASE) 50 MCG/ACT nasal spray Place 1 spray into both nostrils 2 (two) times daily.    ? loratadine (CLARITIN) 10 MG tablet Take 10 mg by mouth daily.    ? metoprolol tartrate (LOPRESSOR)  100 MG tablet Take 1 tablet (100 mg total) by mouth 2 (two) times daily. 180 tablet 2  ? Multiple Vitamin (MULTIVITAMIN WITH MINERALS) TABS tablet Take 1 tablet by mouth daily at 12 noon.    ? PHENobarbital (LUMINAL) 97.2 MG tablet Take 97.2 mg by mouth daily. Takes between 3:00-5:00pm.    ? potassium chloride SA (KLOR-CON M) 20 MEQ tablet Take 1 tablet (20 mEq total) by mouth daily. 90 tablet 3  ? pravastatin (PRAVACHOL) 40 MG tablet Take 40 mg by mouth at bedtime.     ? spironolactone (  ALDACTONE) 25 MG tablet Take 0.5 tablets (12.5 mg total) by mouth daily. 45 tablet 2  ? torsemide (DEMADEX) 20 MG tablet Take 3 tablets (60 mg total) by mouth 2 (two) times daily. Take 3 tablets (60 mg total) in the morning and 2 tablets (40 mg total) in the evening 450 tablet 3  ? triamcinolone cream (KENALOG) 0.1 % Apply 1 application. topically 2 (two) times daily.    ? warfarin (COUMADIN) 5 MG tablet Take 5 mg by mouth daily. Patient also takes 1 tablet by mouth daily with a total of 6 mg daily.    ? ?No current facility-administered medications for this encounter.  ? ?Allergies  ?Allergen Reactions  ? Dilaudid [Hydromorphone Hcl] Other (See Comments)  ?  Makes pt hyper   ? Hydromorphone Other (See Comments)  ?  hyperactiviity ?Other reaction(s): made him wild ?Other reaction(s): Unknown  ? Morphine Sulfate   ?  Other reaction(s): at high dose causes confusion  ? Tegretol [Carbamazepine] Hives  ? Carbamazepine Hives and Rash  ?  Other reaction(s): hives ?Other reaction(s): Hives  ? ?Social History  ? ?Socioeconomic History  ? Marital status: Married  ?  Spouse name: Not on file  ? Number of children: Not on file  ? Years of education: Not on file  ? Highest education level: Not on file  ?Occupational History  ? Not on file  ?Tobacco Use  ? Smoking status: Former  ?  Packs/day: 3.00  ?  Years: 48.00  ?  Pack years: 144.00  ?  Types: Cigarettes  ?  Quit date: 2000  ?  Years since quitting: 23.3  ? Smokeless tobacco: Never  ?Vaping  Use  ? Vaping Use: Never used  ?Substance and Sexual Activity  ? Alcohol use: Never  ? Drug use: Never  ? Sexual activity: Never  ?Other Topics Concern  ? Not on file  ?Social History Narrative  ? ** Merg

## 2021-07-15 ENCOUNTER — Encounter (HOSPITAL_COMMUNITY): Payer: Self-pay

## 2021-07-15 ENCOUNTER — Ambulatory Visit (HOSPITAL_COMMUNITY)
Admission: RE | Admit: 2021-07-15 | Discharge: 2021-07-15 | Disposition: A | Discharge status: Home / Self Care | Payer: Medicare PPO | Source: Ambulatory Visit | Attending: Family Medicine | Admitting: Family Medicine

## 2021-07-15 VITALS — BP 104/66 | HR 97 | Wt 235.0 lb

## 2021-07-15 DIAGNOSIS — I252 Old myocardial infarction: Secondary | ICD-10-CM | POA: Insufficient documentation

## 2021-07-15 DIAGNOSIS — I272 Pulmonary hypertension, unspecified: Secondary | ICD-10-CM | POA: Diagnosis not present

## 2021-07-15 DIAGNOSIS — Z7901 Long term (current) use of anticoagulants: Secondary | ICD-10-CM | POA: Insufficient documentation

## 2021-07-15 DIAGNOSIS — I5022 Chronic systolic (congestive) heart failure: Secondary | ICD-10-CM | POA: Diagnosis not present

## 2021-07-15 DIAGNOSIS — I5042 Chronic combined systolic (congestive) and diastolic (congestive) heart failure: Secondary | ICD-10-CM | POA: Diagnosis not present

## 2021-07-15 DIAGNOSIS — Z7984 Long term (current) use of oral hypoglycemic drugs: Secondary | ICD-10-CM | POA: Insufficient documentation

## 2021-07-15 DIAGNOSIS — I110 Hypertensive heart disease with heart failure: Secondary | ICD-10-CM

## 2021-07-15 DIAGNOSIS — G4733 Obstructive sleep apnea (adult) (pediatric): Secondary | ICD-10-CM

## 2021-07-15 DIAGNOSIS — I4821 Permanent atrial fibrillation: Secondary | ICD-10-CM

## 2021-07-15 DIAGNOSIS — I5020 Unspecified systolic (congestive) heart failure: Secondary | ICD-10-CM | POA: Diagnosis not present

## 2021-07-15 DIAGNOSIS — Z79899 Other long term (current) drug therapy: Secondary | ICD-10-CM | POA: Insufficient documentation

## 2021-07-15 DIAGNOSIS — I10 Essential (primary) hypertension: Secondary | ICD-10-CM

## 2021-07-15 LAB — BASIC METABOLIC PANEL
Anion gap: 9 (ref 5–15)
BUN: 28 mg/dL — ABNORMAL HIGH (ref 8–23)
CO2: 33 mmol/L — ABNORMAL HIGH (ref 22–32)
Calcium: 8.7 mg/dL — ABNORMAL LOW (ref 8.9–10.3)
Chloride: 101 mmol/L (ref 98–111)
Creatinine, Ser: 1.4 mg/dL — ABNORMAL HIGH (ref 0.61–1.24)
GFR, Estimated: 51 mL/min — ABNORMAL LOW (ref >60)
Glucose, Bld: 70 mg/dL (ref 70–99)
Potassium: 3.4 mmol/L — ABNORMAL LOW (ref 3.5–5.1)
Sodium: 143 mmol/L (ref 135–145)

## 2021-07-15 LAB — BRAIN NATRIURETIC PEPTIDE: B Natriuretic Peptide: 642.4 pg/mL — ABNORMAL HIGH (ref 0.0–100.0)

## 2021-07-15 MED ORDER — TORSEMIDE 20 MG PO TABS: 40.0000 mg | ORAL_TABLET | Freq: Two times a day (BID) | ORAL | 3 refills | Status: DC

## 2021-07-15 NOTE — Progress Notes (Signed)
ReDS Vest / Clip - 07/15/21 1100   ? ?  ? ReDS Vest / Clip  ? Station Marker D   ? Ruler Value 33.5   ? ReDS Value Range Low volume   ? ReDS Actual Value 23   ? ?  ?  ? ?  ? ? ?

## 2021-07-15 NOTE — Patient Instructions (Signed)
DECREASE Torsemide to 40 mg (2 tabs) twice a day ? ?Labs today ?We will only contact you if something comes back abnormal or we need to make some changes. ?Otherwise no news is good news! ? ?Your physician recommends that you schedule a follow-up appointment in: 4-6 weeks  in the Advanced Practitioners (PA/NP) Clinic and in 3 months with Dr Haroldine Laws ? ?Do the following things EVERYDAY: ?Weigh yourself in the morning before breakfast. Write it down and keep it in a log. ?Take your medicines as prescribed ?Eat low salt foods--Limit salt (sodium) to 2000 mg per day.  ?Stay as active as you can everyday ?Limit all fluids for the day to less than 2 liters ? ?At the [facility], you and your health needs are our priority. As part of our continuing mission to provide you with exceptional heart care, we have created designated Provider Care Teams. These Care Teams include your primary Cardiologist (physician) and Advanced Practice Providers (APPs- Physician Assistants and Nurse Practitioners) who all work together to provide you with the care you need, when you need it.  ? ?You may see any of the following providers on your designated Care Team at your next follow up: ?Dr Glori Bickers ?Dr Loralie Champagne ?Darrick Grinder, NP ?Lyda Jester, PA ?Jessica [place]ford,NP ?Marlyce Huge, PA ?Audry Riles, PharmD ? ? ?Please be sure to bring in all your medications bottles to every appointment.  ? ?If you have any questions or concerns before your next appointment please send [place] a message through [place] or call our office at [phone].   ? ?TO LEAVE A MESSAGE FOR THE NURSE SELECT OPTION 2, PLEASE LEAVE A MESSAGE INCLUDING: ?YOUR NAME ?DATE OF BIRTH ?CALL BACK NUMBER ?REASON FOR CALL**this is important as we prioritize the call backs ? ?YOU WILL RECEIVE A CALL BACK THE SAME DAY AS LONG AS YOU CALL BEFORE 4:00 PM ? ? ?

## 2021-07-19 ENCOUNTER — Other Ambulatory Visit (HOSPITAL_COMMUNITY): Payer: Self-pay

## 2021-07-19 ENCOUNTER — Telehealth (HOSPITAL_COMMUNITY): Payer: Self-pay

## 2021-07-19 NOTE — Progress Notes (Signed)
Paramedicine Encounter ? ? ? Patient ID: Ruben Murray, male    DOB: 1945-11-03, 76 y.o.   MRN: [id number] ? ? ?Arrived for home visit for Ruben Murray where he was seated in his recliner alert and oriented reporting to be feeling well today. He denied chest pain or dizziness but states when he is up moving around a lot he gets short of breath easy. He was seen in clinic on Friday and his Torsemide and Potassium were increased, he has not started those dosings yet. I ensured they start on 5/16 during our visit. On assessment he had some mild lower leg edema, lungs clear. Abdomen non distended. He reports no bowel movement in 3 days. He started taking Miralax for same last night on 5/15. Weight is stable today.  ? ?Vitals as noted in report. I reviewed medications and filled pill box accordingly with the med updates.  I will update family PCP Care Management Team.  ?Woodroe Mode RN [phone] ? ?Ruben Murray had gotten up to walk into the kitchen and he was noticeably unsteady on his feet. He had a moment where he appeared as if he may fall. I assisted him and he was able to regain his footing. He denied dizziness during this time but reports sometimes he gets off balance. I advised him he should be using a cane or walker when moving about. He agreed. I obtained a cane from his closet and placed it by his side. He understands to use same when getting up and walking from now on.  ? ?We reviewed upcoming appointments and confirmed same. Home visit complete. I plan to see Ruben Murray in one week.   ? ? ?Patient Care Team: ?[name], MD as PCP - General (Family Medicine) ?[name], MD as PCP - Cardiology (Cardiology) ?[name], MD as PCP - Electrophysiology (Cardiology) ?[name], MD (Family Medicine) ? ?Patient Active Problem List  ? Diagnosis Date Noted  ? Acute on chronic systolic CHF (congestive heart failure) ([place]) 05/17/2021  ? Hypercoagulable state ([place]) 05/10/2021  ? Left leg pain  04/24/2021  ? OSA (obstructive sleep apnea) 04/24/2021  ? Mitral regurgitation 04/24/2021  ? Tricuspid regurgitation 04/24/2021  ? Dilated aortic root ([place]) 04/24/2021  ? Permanent atrial fibrillation ([place]) 04/19/2021  ? Chronic arthropathy 04/13/2020  ? Callus 04/13/2020  ? Onychomycosis 12/12/2019  ? Left leg cellulitis 12/12/2019  ? Cellulitis 12/09/2019  ? Dilated cardiomyopathy ([place])   ? Atrial flutter ([place]) 11/03/2019  ? Atrial flutter with rapid ventricular response ([place]) 11/03/2019  ? Acquired thrombophilia ([place]) 01/15/2019  ? Chronic combined systolic and diastolic heart failure ([place]) 10/29/2017  ? CHF (congestive heart failure) ([place]) 10/29/2017  ? Prolonged QT interval 10/29/2017  ? Sensorineural hearing loss (SNHL) of both ears 01/31/2017  ? Dysphonia 10/03/2016  ? Laryngopharyngeal reflux (LPR) 10/03/2016  ? Nasal polyps 10/03/2016  ? Rhinitis medicamentosa 10/03/2016  ? Chronic laryngitis 10/03/2016  ? Hoarseness 08/31/2016  ? Moderate persistent asthma 08/02/2016  ? COPD GOLD II  08/02/2016  ? Morbid (severe) obesity due to excess calories ([place]) 08/02/2016  ? CAP (community acquired pneumonia) 03/07/2016  ? Community acquired pneumonia 03/06/2016  ? Essential hypertension 03/06/2016  ? Hyperlipidemia 03/06/2016  ? Right thyroid nodule 03/06/2016  ? Seizures ([place]) 03/06/2016  ? Fever 03/12/2015  ? Recurrent erosion of cornea 06/23/2011  ? ? ?Current Outpatient Medications:  ?  acetaminophen (TYLENOL) 500 MG tablet, Take 1,000 mg by mouth at bedtime as needed for mild pain  or headache., Disp: , Rfl:  ?  Carboxymethylcellulose Sod PF 0.5 % SOLN, Place 1 drop into both eyes 4 (four) times daily as needed (dry eyes)., Disp: , Rfl:  ?  Cholecalciferol (VITAMIN D3) 25 MCG (1000 UT) CAPS, Take 1,000 Units by mouth daily., Disp: , Rfl:  ?  empagliflozin (JARDIANCE) 10 MG TABS tablet, Take 1 tablet (10 mg total) by mouth daily before breakfast., Disp: 90 tablet, Rfl: 3 ?  escitalopram (LEXAPRO) 20 MG tablet,  Take 20 mg by mouth daily. Takes at bedtime., Disp: , Rfl:  ?  finasteride (PROSCAR) 5 MG tablet, Take 5 mg by mouth daily at 12 noon. , Disp: , Rfl:  ?  fluticasone (FLONASE) 50 MCG/ACT nasal spray, Place 1 spray into both nostrils 2 (two) times daily., Disp: , Rfl:  ?  loratadine (CLARITIN) 10 MG tablet, Take 10 mg by mouth daily., Disp: , Rfl:  ?  metoprolol tartrate (LOPRESSOR) 100 MG tablet, Take 1 tablet (100 mg total) by mouth 2 (two) times daily., Disp: 180 tablet, Rfl: 2 ?  Multiple Vitamin (MULTIVITAMIN WITH MINERALS) TABS tablet, Take 1 tablet by mouth daily at 12 noon., Disp: , Rfl:  ?  PHENobarbital (LUMINAL) 97.2 MG tablet, Take 97.2 mg by mouth daily. Takes between 3:00-5:00pm., Disp: , Rfl:  ?  potassium chloride SA (KLOR-CON M) 20 MEQ tablet, Take 1 tablet (20 mEq total) by mouth daily., Disp: 90 tablet, Rfl: 3 ?  pravastatin (PRAVACHOL) 40 MG tablet, Take 40 mg by mouth at bedtime. , Disp: , Rfl:  ?  spironolactone (ALDACTONE) 25 MG tablet, Take 0.5 tablets (12.5 mg total) by mouth daily., Disp: 45 tablet, Rfl: 2 ?  torsemide (DEMADEX) 20 MG tablet, Take 2 tablets (40 mg total) by mouth 2 (two) times daily., Disp: 360 tablet, Rfl: 3 ?  triamcinolone cream (KENALOG) 0.1 %, Apply 1 application. topically 2 (two) times daily., Disp: , Rfl:  ?  warfarin (COUMADIN) 5 MG tablet, Take 5 mg by mouth daily. Patient also takes 1 tablet by mouth daily with a total of 6 mg daily., Disp: , Rfl:  ?Allergies  ?Allergen Reactions  ? Dilaudid [Hydromorphone Hcl] Other (See Comments)  ?  Makes pt hyper   ? Hydromorphone Other (See Comments)  ?  hyperactiviity ?Other reaction(s): made him wild ?Other reaction(s): Unknown  ? Morphine Sulfate   ?  Other reaction(s): at high dose causes confusion  ? Tegretol [Carbamazepine] Hives  ? Carbamazepine Hives and Rash  ?  Other reaction(s): hives ?Other reaction(s): Hives  ? ? ? ?Social History  ? ?Socioeconomic History  ? Marital status: Married  ?  Spouse name: Not on file   ? Number of children: Not on file  ? Years of education: Not on file  ? Highest education level: Not on file  ?Occupational History  ? Not on file  ?Tobacco Use  ? Smoking status: Former  ?  Packs/day: 3.00  ?  Years: 48.00  ?  Pack years: 144.00  ?  Types: Cigarettes  ?  Quit date: 2000  ?  Years since quitting: 23.3  ? Smokeless tobacco: Never  ?Vaping Use  ? Vaping Use: Never used  ?Substance and Sexual Activity  ? Alcohol use: Never  ? Drug use: Never  ? Sexual activity: Never  ?Other Topics Concern  ? Not on file  ?Social History Narrative  ? ** Merged History Encounter **  ?    ? ?Social Determinants of Health  ? ?Financial  Resource Strain: Not on file  ?Food Insecurity: No Food Insecurity  ? Worried About Charity fundraiser in the Last Year: Never true  ? Ran Out of Food in the Last Year: Never true  ?Transportation Needs: No Transportation Needs  ? Lack of Transportation (Medical): No  ? Lack of Transportation (Non-Medical): No  ?Physical Activity: Not on file  ?Stress: Not on file  ?Social Connections: Not on file  ?Intimate Partner Violence: Not on file  ? ? ?Physical Exam ? ? ? ? ? ?Future Appointments  ?Date Time Provider [place]  ?08/16/2021 11:00 AM [facility] PA/NP [facility] None  ?09/27/2021 11:20 AM [place], PA-C [facility]  ?10/24/2021 11:00 AM Bensimhon, Shaune Pascal, MD [facility] None  ?11/14/2021  3:00 PM [name], MD [facility]  ? ? ? ?ACTION: ?Home visit completed ? ? ? ? ? ? ?

## 2021-07-19 NOTE — Telephone Encounter (Signed)
-----  Message from Rafael Bihari, [place] sent at 07/15/2021  1:50 PM EDT ----- ?K low and BNP up. ? ?Increase torsemide to60 mg bid.  ? ?Take extra 40 KCL x 1 today and increase daily KCL to 40 daily. ? ?Repeat BMET in 10 days ?

## 2021-07-19 NOTE — Telephone Encounter (Signed)
Patient advised and verbalized understanding. Med list updated to reflect changes, patient will have repeat blood work drawn at [facility] faxed.  ?

## 2021-07-20 ENCOUNTER — Telehealth (HOSPITAL_COMMUNITY): Payer: Self-pay

## 2021-07-20 NOTE — Telephone Encounter (Signed)
Called in refill for Jardiance for Mr. Reier.  ?This medication will be refilled today.  ?Call complete. ?

## 2021-07-20 NOTE — Telephone Encounter (Signed)
Spoke to Ruben Murray PCP Case Manager RN today who reports he does not have an upcoming appointment with PCP to obtain labs and these will need to be drawn in HF clinic after med changes made in clinic on 5/12. He started these med changes yesterday at our visit. I will message HF triage to get these scheduled.  ? ? ? ? ?Per RN- Ruben Murray next PCP appointment is AUG 23rd.  ?

## 2021-07-26 ENCOUNTER — Emergency Department (HOSPITAL_COMMUNITY): Payer: Medicare PPO

## 2021-07-26 ENCOUNTER — Other Ambulatory Visit: Payer: Self-pay

## 2021-07-26 ENCOUNTER — Telehealth (HOSPITAL_COMMUNITY): Payer: Self-pay | Admitting: Family Medicine

## 2021-07-26 ENCOUNTER — Inpatient Hospital Stay (HOSPITAL_COMMUNITY)
Admission: EM | Admit: 2021-07-26 | Discharge: 2021-08-01 | DRG: 175 | Disposition: A | Discharge status: Home Health | Payer: Medicare PPO | Attending: Family Medicine | Admitting: Family Medicine

## 2021-07-26 ENCOUNTER — Ambulatory Visit (HOSPITAL_COMMUNITY)
Admission: RE | Admit: 2021-07-26 | Discharge: 2021-07-26 | Disposition: A | Discharge status: Home / Self Care | Payer: Medicare PPO | Source: Ambulatory Visit | Attending: Cardiology | Admitting: Cardiology

## 2021-07-26 ENCOUNTER — Other Ambulatory Visit (HOSPITAL_COMMUNITY): Payer: Self-pay

## 2021-07-26 DIAGNOSIS — Z6834 Body mass index (BMI) 34.0-34.9, adult: Secondary | ICD-10-CM

## 2021-07-26 DIAGNOSIS — Z96652 Presence of left artificial knee joint: Secondary | ICD-10-CM | POA: Diagnosis present

## 2021-07-26 DIAGNOSIS — J4540 Moderate persistent asthma, uncomplicated: Secondary | ICD-10-CM | POA: Diagnosis present

## 2021-07-26 DIAGNOSIS — I700 Atherosclerosis of aorta: Secondary | ICD-10-CM | POA: Diagnosis present

## 2021-07-26 DIAGNOSIS — I110 Hypertensive heart disease with heart failure: Secondary | ICD-10-CM | POA: Diagnosis not present

## 2021-07-26 DIAGNOSIS — I130 Hypertensive heart and chronic kidney disease with heart failure and stage 1 through stage 4 chronic kidney disease, or unspecified chronic kidney disease: Secondary | ICD-10-CM | POA: Diagnosis present

## 2021-07-26 DIAGNOSIS — I428 Other cardiomyopathies: Secondary | ICD-10-CM | POA: Diagnosis present

## 2021-07-26 DIAGNOSIS — N179 Acute kidney failure, unspecified: Secondary | ICD-10-CM | POA: Diagnosis present

## 2021-07-26 DIAGNOSIS — J449 Chronic obstructive pulmonary disease, unspecified: Secondary | ICD-10-CM | POA: Diagnosis not present

## 2021-07-26 DIAGNOSIS — Z515 Encounter for palliative care: Secondary | ICD-10-CM

## 2021-07-26 DIAGNOSIS — Z87891 Personal history of nicotine dependence: Secondary | ICD-10-CM | POA: Diagnosis not present

## 2021-07-26 DIAGNOSIS — I509 Heart failure, unspecified: Secondary | ICD-10-CM

## 2021-07-26 DIAGNOSIS — E669 Obesity, unspecified: Secondary | ICD-10-CM | POA: Diagnosis present

## 2021-07-26 DIAGNOSIS — Z7901 Long term (current) use of anticoagulants: Secondary | ICD-10-CM | POA: Diagnosis not present

## 2021-07-26 DIAGNOSIS — I10 Essential (primary) hypertension: Secondary | ICD-10-CM | POA: Diagnosis not present

## 2021-07-26 DIAGNOSIS — Z20822 Contact with and (suspected) exposure to covid-19: Secondary | ICD-10-CM | POA: Diagnosis present

## 2021-07-26 DIAGNOSIS — Z8249 Family history of ischemic heart disease and other diseases of the circulatory system: Secondary | ICD-10-CM

## 2021-07-26 DIAGNOSIS — I5042 Chronic combined systolic (congestive) and diastolic (congestive) heart failure: Secondary | ICD-10-CM | POA: Insufficient documentation

## 2021-07-26 DIAGNOSIS — I2693 Single subsegmental pulmonary embolism without acute cor pulmonale: Principal | ICD-10-CM | POA: Diagnosis present

## 2021-07-26 DIAGNOSIS — R55 Syncope and collapse: Secondary | ICD-10-CM | POA: Diagnosis not present

## 2021-07-26 DIAGNOSIS — Z9989 Dependence on other enabling machines and devices: Secondary | ICD-10-CM | POA: Diagnosis not present

## 2021-07-26 DIAGNOSIS — N1832 Chronic kidney disease, stage 3b: Secondary | ICD-10-CM | POA: Diagnosis present

## 2021-07-26 DIAGNOSIS — I5023 Acute on chronic systolic (congestive) heart failure: Secondary | ICD-10-CM | POA: Diagnosis present

## 2021-07-26 DIAGNOSIS — G40909 Epilepsy, unspecified, not intractable, without status epilepticus: Secondary | ICD-10-CM | POA: Diagnosis present

## 2021-07-26 DIAGNOSIS — Z66 Do not resuscitate: Secondary | ICD-10-CM | POA: Diagnosis not present

## 2021-07-26 DIAGNOSIS — H903 Sensorineural hearing loss, bilateral: Secondary | ICD-10-CM | POA: Diagnosis not present

## 2021-07-26 DIAGNOSIS — I4819 Other persistent atrial fibrillation: Secondary | ICD-10-CM | POA: Diagnosis not present

## 2021-07-26 DIAGNOSIS — R0602 Shortness of breath: Secondary | ICD-10-CM | POA: Diagnosis not present

## 2021-07-26 DIAGNOSIS — E876 Hypokalemia: Secondary | ICD-10-CM | POA: Diagnosis not present

## 2021-07-26 DIAGNOSIS — I959 Hypotension, unspecified: Secondary | ICD-10-CM | POA: Diagnosis not present

## 2021-07-26 DIAGNOSIS — G4733 Obstructive sleep apnea (adult) (pediatric): Secondary | ICD-10-CM | POA: Diagnosis present

## 2021-07-26 DIAGNOSIS — I4891 Unspecified atrial fibrillation: Secondary | ICD-10-CM | POA: Diagnosis present

## 2021-07-26 DIAGNOSIS — R531 Weakness: Secondary | ICD-10-CM | POA: Diagnosis not present

## 2021-07-26 DIAGNOSIS — E785 Hyperlipidemia, unspecified: Secondary | ICD-10-CM | POA: Diagnosis present

## 2021-07-26 DIAGNOSIS — I2699 Other pulmonary embolism without acute cor pulmonale: Secondary | ICD-10-CM | POA: Diagnosis not present

## 2021-07-26 DIAGNOSIS — Z885 Allergy status to narcotic agent status: Secondary | ICD-10-CM

## 2021-07-26 DIAGNOSIS — Z888 Allergy status to other drugs, medicaments and biological substances status: Secondary | ICD-10-CM

## 2021-07-26 DIAGNOSIS — I272 Pulmonary hypertension, unspecified: Secondary | ICD-10-CM | POA: Diagnosis not present

## 2021-07-26 DIAGNOSIS — E782 Mixed hyperlipidemia: Secondary | ICD-10-CM | POA: Diagnosis present

## 2021-07-26 DIAGNOSIS — Z7189 Other specified counseling: Secondary | ICD-10-CM | POA: Diagnosis not present

## 2021-07-26 DIAGNOSIS — Z79899 Other long term (current) drug therapy: Secondary | ICD-10-CM

## 2021-07-26 DIAGNOSIS — M7989 Other specified soft tissue disorders: Secondary | ICD-10-CM | POA: Diagnosis not present

## 2021-07-26 DIAGNOSIS — J9811 Atelectasis: Secondary | ICD-10-CM | POA: Diagnosis not present

## 2021-07-26 DIAGNOSIS — R000 Tachycardia, unspecified: Secondary | ICD-10-CM | POA: Diagnosis not present

## 2021-07-26 LAB — MAGNESIUM: Magnesium: 2.5 mg/dL — ABNORMAL HIGH (ref 1.7–2.4)

## 2021-07-26 LAB — BASIC METABOLIC PANEL
Anion gap: 9 (ref 5–15)
Anion gap: 10 (ref 5–15)
BUN: 35 mg/dL — ABNORMAL HIGH (ref 8–23)
BUN: 36 mg/dL — ABNORMAL HIGH (ref 8–23)
CO2: 28 mmol/L (ref 22–32)
CO2: 28 mmol/L (ref 22–32)
Calcium: 8.8 mg/dL — ABNORMAL LOW (ref 8.9–10.3)
Calcium: 8.6 mg/dL — ABNORMAL LOW (ref 8.9–10.3)
Chloride: 101 mmol/L (ref 98–111)
Chloride: 99 mmol/L (ref 98–111)
Creatinine, Ser: 1.63 mg/dL — ABNORMAL HIGH (ref 0.61–1.24)
Creatinine, Ser: 1.73 mg/dL — ABNORMAL HIGH (ref 0.61–1.24)
GFR, Estimated: 43 mL/min — ABNORMAL LOW (ref >60)
GFR, Estimated: 40 mL/min — ABNORMAL LOW (ref >60)
Glucose, Bld: 92 mg/dL (ref 70–99)
Glucose, Bld: 123 mg/dL — ABNORMAL HIGH (ref 70–99)
Potassium: 4.2 mmol/L (ref 3.5–5.1)
Potassium: 3.9 mmol/L (ref 3.5–5.1)
Sodium: 138 mmol/L (ref 135–145)
Sodium: 137 mmol/L (ref 135–145)

## 2021-07-26 LAB — CBC
HCT: 40.3 % (ref 39.0–52.0)
Hemoglobin: 12.8 g/dL — ABNORMAL LOW (ref 13.0–17.0)
MCH: 30 pg (ref 26.0–34.0)
MCHC: 31.8 g/dL (ref 30.0–36.0)
MCV: 94.4 fL (ref 80.0–100.0)
Platelets: 151 10*3/uL (ref 150–400)
RBC: 4.27 MIL/uL (ref 4.22–5.81)
RDW: 17.1 % — ABNORMAL HIGH (ref 11.5–15.5)
WBC: 9.1 10*3/uL (ref 4.0–10.5)
nRBC: 0 % (ref 0.0–0.2)

## 2021-07-26 LAB — RESP PANEL BY RT-PCR (FLU A&B, COVID) ARPGX2
Influenza A by PCR: NEGATIVE
Influenza B by PCR: NEGATIVE
SARS Coronavirus 2 by RT PCR: NEGATIVE

## 2021-07-26 LAB — PROTIME-INR
INR: 3.8 — ABNORMAL HIGH (ref 0.8–1.2)
Prothrombin Time: 37.5 seconds — ABNORMAL HIGH (ref 11.4–15.2)

## 2021-07-26 LAB — BRAIN NATRIURETIC PEPTIDE: B Natriuretic Peptide: 753.5 pg/mL — ABNORMAL HIGH (ref 0.0–100.0)

## 2021-07-26 MED ORDER — METOPROLOL TARTRATE 100 MG PO TABS
100.0000 mg | ORAL_TABLET | Freq: Two times a day (BID) | ORAL | Status: DC
  Administered 2021-07-26 – 2021-08-01 (×12): 100 mg via ORAL
  Filled 2021-07-26 (×11): qty 1
  Filled 2021-07-26: qty 4

## 2021-07-26 MED ORDER — METOPROLOL TARTRATE 5 MG/5ML IV SOLN
5.0000 mg | Freq: Once | INTRAVENOUS | Status: AC
  Administered 2021-07-26: 5 mg via INTRAVENOUS
  Filled 2021-07-26: qty 5

## 2021-07-26 MED ORDER — LACTATED RINGERS IV BOLUS
500.0000 mL | Freq: Once | INTRAVENOUS | Status: AC
  Administered 2021-07-26: 500 mL via INTRAVENOUS

## 2021-07-26 MED ORDER — IOHEXOL 350 MG/ML SOLN
80.0000 mL | Freq: Once | INTRAVENOUS | Status: AC | PRN
  Administered 2021-07-26: 80 mL via INTRAVENOUS

## 2021-07-26 NOTE — Telephone Encounter (Signed)
Notified by paramedic, Nira [name], patient in rapid atrial fibrillation, HR 120's. BP 100/60, he feels poorly. Advised evaluation in ED. Patient transported via [facility] to [facility] ED.  Allena Katz, FNP-BC 07/26/21

## 2021-07-26 NOTE — Progress Notes (Signed)
Paramedicine Encounter    Patient ID: Ruben Murray, male    DOB: 02-10-1946, 76 y.o.   MRN: [id number]   Arrived for home visit for Mr. Treat who was alert and oriented seated in his recliner. He was pale and diaphoretic on assessment. He reports that about 30 mins ago he was walking in the hallway with his walker and got weak and fell. He denied loss of consciousness but neighbor reports him being "out of it". He did not have any injuries on assessment. He was assessed by [facility] and they contacted me and I came out for further evaluation. On assessment his EKG was afib at a rate of 98-145 irregular and thready on palpation. He was hypotensive at 100/60 on eval.  He reports some shortness of breath but no chest pain. He stated he has felt extra tired over the last few days but did not notify me or the HF clinic. No swelling noted. He was seen in clinic today for labs where NP was making medication changes  wanting to hold his Torsemide and Spironolactone for two days then reducing his Torsemide to 34m BID and then back to his 12.531mof Spironolactone after the two days. [facility] and [facility].   [facility] [phone] ALS measures being carried out enroute.   Mr. BlGentlesgreed we would follow up once he is discharged. Home visit complete.    I did not fill his pill box today pending medication changes during hospital stay. I will follow up once he is home.       Patient Care Team: HaCaren MacadamMD as PCP - General (Family Medicine) SkJerline PainMD as PCP - Cardiology (Cardiology) CaConstance HawMD as PCP - Electrophysiology (Cardiology) HaCaren MacadamMD (Family Medicine)  Patient Active Problem List   Diagnosis Date Noted   Acute on chronic systolic CHF (congestive heart failure) (HCSt. [facility]03/14/2023   Hypercoagulable state (HCClarendon03/09/2021   Left leg  pain 04/24/2021   OSA (obstructive sleep apnea) 04/24/2021   Mitral regurgitation 04/24/2021   Tricuspid regurgitation 04/24/2021   Dilated aortic root (HCPrim[place]02/19/2023   Permanent atrial fibrillation (HCBowmans Addition02/14/2023   Chronic arthropathy 04/13/2020   Callus 04/13/2020   Onychomycosis 12/12/2019   Left leg cellulitis 12/12/2019   Cellulitis 12/09/2019   Dilated cardiomyopathy (HCWor[place]   Atrial flutter (HCBeckham08/30/2021   Atrial flutter with rapid ventricular response (HCBeller[facility] Terr[place]08/30/2021   Acquired thrombophilia (HCBrooklyn11/01/2019   Chronic combined systolic and diastolic heart failure (HCAkut[place]08/26/2019   CHF (congestive heart failure) (HCMcBride08/26/2019   Prolonged QT interval 10/29/2017   Sensorineural hearing loss (SNHL) of both ears 01/31/2017   Dysphonia 10/03/2016   Laryngopharyngeal reflux (LPR) 10/03/2016   Nasal polyps 10/03/2016   Rhinitis medicamentosa 10/03/2016   Chronic laryngitis 10/03/2016   Hoarseness 08/31/2016   Moderate persistent asthma 08/02/2016   COPD GOLD II  08/02/2016   Morbid (severe) obesity due to excess calories (HCPlain05/30/2018   CAP (community acquired pneumonia) 03/07/2016   Community acquired pneumonia 03/06/2016   Essential hypertension 03/06/2016   Hyperlipidemia 03/06/2016   Right thyroid nodule 03/06/2016   Seizures (HCGridley01/03/2016   Fever 03/12/2015   Recurrent erosion of cornea 06/23/2011    Current Outpatient Medications:    acetaminophen (TYLENOL) 500 MG tablet, Take 1,000 mg by mouth at bedtime as needed  for mild pain or headache., Disp: , Rfl:    Carboxymethylcellulose Sod PF 0.5 % SOLN, Place 1 drop into both eyes 4 (four) times daily as needed (dry eyes)., Disp: , Rfl:    Cholecalciferol (VITAMIN D3) 25 MCG (1000 UT) CAPS, Take 1,000 Units by mouth daily., Disp: , Rfl:    empagliflozin (JARDIANCE) 10 MG TABS tablet, Take 1 tablet (10 mg total) by mouth daily before breakfast., Disp: 90 tablet, Rfl: 3   escitalopram (LEXAPRO) 20 MG  tablet, Take 20 mg by mouth daily. Takes at bedtime., Disp: , Rfl:    finasteride (PROSCAR) 5 MG tablet, Take 5 mg by mouth daily at 12 noon. , Disp: , Rfl:    fluticasone (FLONASE) 50 MCG/ACT nasal spray, Place 1 spray into both nostrils 2 (two) times daily., Disp: , Rfl:    loratadine (CLARITIN) 10 MG tablet, Take 10 mg by mouth daily., Disp: , Rfl:    metoprolol tartrate (LOPRESSOR) 100 MG tablet, Take 1 tablet (100 mg total) by mouth 2 (two) times daily., Disp: 180 tablet, Rfl: 2   Multiple Vitamin (MULTIVITAMIN WITH MINERALS) TABS tablet, Take 1 tablet by mouth daily at 12 noon., Disp: , Rfl:    PHENobarbital (LUMINAL) 97.2 MG tablet, Take 97.2 mg by mouth daily. Takes between 3:00-5:00pm., Disp: , Rfl:    potassium chloride SA (KLOR-CON M) 20 MEQ tablet, Take 1 tablet (20 mEq total) by mouth daily., Disp: 90 tablet, Rfl: 3   pravastatin (PRAVACHOL) 40 MG tablet, Take 40 mg by mouth at bedtime. , Disp: , Rfl:    spironolactone (ALDACTONE) 25 MG tablet, Take 0.5 tablets (12.5 mg total) by mouth daily., Disp: 45 tablet, Rfl: 2   torsemide (DEMADEX) 20 MG tablet, Take 2 tablets (40 mg total) by mouth 2 (two) times daily., Disp: 360 tablet, Rfl: 3   triamcinolone cream (KENALOG) 0.1 %, Apply 1 application. topically 2 (two) times daily., Disp: , Rfl:    warfarin (COUMADIN) 5 MG tablet, Take 5 mg by mouth daily. Patient also takes 1 tablet by mouth daily with a total of 6 mg daily., Disp: , Rfl:  Allergies  Allergen Reactions   Dilaudid [Hydromorphone Hcl] Other (See Comments)    Makes pt hyper    Hydromorphone Other (See Comments)    hyperactiviity Other reaction(s): made him wild Other reaction(s): Unknown   Morphine Sulfate     Other reaction(s): at high dose causes confusion   Tegretol [Carbamazepine] Hives   Carbamazepine Hives and Rash    Other reaction(s): hives Other reaction(s): Hives     Social History   Socioeconomic History   Marital status: Married    Spouse name: Not  on file   Number of children: Not on file   Years of education: Not on file   Highest education level: Not on file  Occupational History   Not on file  Tobacco Use   Smoking status: Former    Packs/day: 3.00    Years: 48.00    Pack years: 144.00    Types: Cigarettes    Quit date: 2000    Years since quitting: 23.4   Smokeless tobacco: Never  Vaping Use   Vaping Use: Never used  Substance and Sexual Activity   Alcohol use: Never   Drug use: Never   Sexual activity: Never  Other Topics Concern   Not on file  Social History Narrative   ** Merged History Encounter **       Social Determinants of Health  Financial Resource Strain: Not on file  Food Insecurity: No Food Insecurity   Worried About Charity fundraiser in the Last Year: Never true   Arboriculturist in the Last Year: Never true  Transportation Needs: No Transportation Needs   Lack of Transportation (Medical): No   Lack of Transportation (Non-Medical): No  Physical Activity: Not on file  Stress: Not on file  Social Connections: Not on file  Intimate Partner Violence: Not on file    Physical Exam Constitutional:      Appearance: He is normal weight. He is ill-appearing and diaphoretic.  HENT:     Head: Normocephalic.     Nose: Nose normal.     Mouth/Throat:     Mouth: Mucous membranes are moist.     Pharynx: Oropharynx is clear.  Eyes:     Conjunctiva/sclera: Conjunctivae normal.     Pupils: Pupils are equal, round, and reactive to light.  Cardiovascular:     Rate and Rhythm: Tachycardia present. Rhythm irregular.  Pulmonary:     Effort: Pulmonary effort is normal.     Breath sounds: Normal breath sounds.  Abdominal:     General: Abdomen is flat.     Palpations: Abdomen is soft.  Musculoskeletal:        General: No swelling. Normal range of motion.     Cervical back: Normal range of motion.     Right lower leg: No edema.     Left lower leg: No edema.  Skin:    Capillary Refill: Capillary refill  takes less than 2 seconds.     Coloration: Skin is pale.  Neurological:     General: No focal deficit present.     Mental Status: He is alert. Mental status is at baseline.  Psychiatric:        Mood and Affect: Mood normal.        Future Appointments  Date Time Provider [place]  08/16/2021 11:00 AM [facility] PA/NP [facility] None  09/27/2021 11:20 AM Baldwin [place], PA-C [facility]  10/24/2021 11:00 AM Bensimhon, Shaune Pascal, MD [facility] None  11/14/2021  3:00 PM [name], MD [facility]     ACTION: Home visit completed

## 2021-07-26 NOTE — Progress Notes (Incomplete)
ANTICOAGULATION CONSULT NOTE - Initial Consult  Pharmacy Consult for Heparin Indication: pulmonary embolus  Allergies  Allergen Reactions   Dilaudid [Hydromorphone Hcl] Other (See Comments)    Makes pt hyper    Hydromorphone Other (See Comments)    hyperactiviity Other reaction(s): made him wild Other reaction(s): Unknown   Morphine Sulfate     Other reaction(s): at high dose causes confusion   Tegretol [Carbamazepine] Hives   Carbamazepine Hives and Rash    Other reaction(s): hives Other reaction(s): Hives    Patient Measurements: Height: _0  (172.7 cm) Weight: 104.3 kg (230 lb) IBW/kg (Calculated) : 68.4 Heparin Dosing Weight: 91.1 kg  Vital Signs: Temp: 98.4 F (36.9 C) (05/23 1752) Temp Source: Oral (05/23 1752) BP: 91/69 (05/23 2215) Pulse Rate: 100 (05/23 2215)  Labs: Recent Labs    07/26/21 1208 07/26/21 1757 07/26/21 2025  HGB  --  12.8*  --   HCT  --  40.3  --   PLT  --  151  --   LABPROT  --   --  37.5*  INR  --   --  3.8*  CREATININE 1.63* 1.73*  --     Estimated Creatinine Clearance: 41.2 mL/min (A) (by C-G formula based on SCr of 1.73 mg/dL (H)).   Medical History: Past Medical History:  Diagnosis Date   Acquired thrombophilia ([place]) 01/15/2019   Aortic atherosclerosis ([place])    Aortic root dilatation (HCC)    CAP (community acquired pneumonia) 03/06/2016   Chronic laryngitis 24/58/0998   Chronic systolic CHF (congestive heart failure) ([place])    COPD GOLD II  08/02/2016   Quit smoking 2000  PFT's  07/10/2016  FEV1 1.98 (70 % ) ratio 67  p 19 % improvement from saba p nothing  prior to study while of coreg so rec as of 08/02/2016 try off coreg and on bisoprolol      Essential hypertension 03/06/2016   Changed from coreg to bisoprolol due to copd with reversible component  08/02/2016 >>>    Hoarseness 08/31/2016   Referred to ent 08/31/2016 >>> seen 10/03/16 [name] dx gerd and rhinitis medicamentosa   > improved on f/u 01/31/17 on gerd rx/ flonase  and off afrin   Hyperlipidemia    Hypertension    Laryngopharyngeal reflux (LPR) 10/03/2016   Mitral regurgitation    Moderate persistent asthma 08/02/2016   Morbid (severe) obesity due to excess calories ([place]) 08/02/2016   Nasal polyps 10/03/2016   NICM (nonischemic cardiomyopathy) ([place])    a. EF 40-45% in 04/2016, etiology not defined, managed medically.   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   Rhinitis medicamentosa 10/03/2016   Right thyroid nodule 03/06/2016   Seizures ([place])    "take RX daily" (03/06/2016)   Sensorineural hearing loss (SNHL) of both ears 01/31/2017   Tricuspid regurgitation     Medications:  Scheduled:   metoprolol tartrate  100 mg Oral BID   Assessment: 76 yo M presenting with atrial fibrillation RVR.  Patient is on Warfarin PTA for atrial fibrillation, history of CHF with recent EF 20-25% (February 2023). Last PTA Warfarin dose on 5/22 at ~2100.  CT chest with small segmental and subsegmental pulmonary emboli bilaterally; the right ventricle is distended which may be associated with underlying right heart strain. Pharmacy consulted for heparin dosing.   Today INR 3.8, PT 37.5. Baseline Hgb 12.8, plt  151. No signs/symptoms of bleeding noted.   PTA Warfarin regimen per patient: 6 mg PO daily.   Goal of Therapy:  INR 2-3 Heparin level 0.3-0.7 units/ml Monitor platelets by anticoagulation protocol: Yes   Plan:  *** Monitor CBC and for signs/symptoms of bleeding.   Vance Peper, PharmD PGY1 Pharmacy Resident 07/26/2021 10:51 PM   Please check AMION for all [place] phone numbers After 10:00 PM, call [place] [phone]

## 2021-07-26 NOTE — ED Triage Notes (Addendum)
[facility]. EMS called early in the day- felt weak and had to 'take a knee.' EMS came out and found PT to be in afib with a rate in 140's. Patient was not transported so community paramedic could eval- Remained 90's-120's upon second eval and recent kidney labs found to be elevated from [place] this morning. Only complaint from pt is feeling weak for about one month. Hx afib on thinner. No CP. Aox4.

## 2021-07-26 NOTE — ED Notes (Signed)
Patient uses urinal and has 43m light yellow urine output.

## 2021-07-26 NOTE — ED Provider Notes (Signed)
[facility] EMERGENCY DEPARTMENT Provider Note   CSN: [id number] Arrival date & time: 07/26/21  1746     History  Chief Complaint  Patient presents with   Tachycardia    Ruben Murray is a 76 y.o. male.  HPI  76 year old male with a history of atrial fibrillation on warfarin who presents to the emergency department with generalized fatigue and weakness.  EMS was called out to the patient's house and the patient was found to be in atrial fibrillation with RVR with rates in the 140s.  The patient states that he is on metoprolol 100 mg twice daily at baseline.  The patient denies any chest pain or shortness of breath.  He denies any cough.  He denies any fevers or chills.  He feels somewhat dehydrated.  He states that he was diagnosed with CHF and is on a diuretic.  On chart review, the patient was seen in the advanced heart failure clinic on 07/15/2021 for new diagnosis of CHF likely due to NICM with a most recent echocardiogram with an EF of 20 to 25%.  Left heart cath in 2021 showed normal coronary arteries.  Per cardiology notes he should be on torsemide 40 mg twice daily.  He states that the diuretic keeps him up throughout the night.  Home Medications Prior to Admission medications   Medication Sig Start Date End Date Taking? Authorizing Provider  warfarin (COUMADIN) 5 MG tablet Take 5 mg by mouth daily. Patient also takes 1 tablet by mouth daily with a total of 6 mg daily. 11/22/20  Yes [provider]  acetaminophen (TYLENOL) 500 MG tablet Take 1,000 mg by mouth at bedtime as needed for mild pain or headache.    [provider]  Carboxymethylcellulose Sod PF 0.5 % SOLN Place 1 drop into both eyes 4 (four) times daily as needed (dry eyes).    [provider]  Cholecalciferol (VITAMIN D3) 25 MCG (1000 UT) CAPS Take 1,000 Units by mouth daily.    [provider]  empagliflozin (JARDIANCE) 10 MG TABS tablet Take 1 tablet (10 mg total)  by mouth daily before breakfast. 06/29/21   Bensimhon, Shaune Pascal, MD  escitalopram (LEXAPRO) 20 MG tablet Take 20 mg by mouth daily. Takes at bedtime.    [provider]  finasteride (PROSCAR) 5 MG tablet Take 5 mg by mouth daily at 12 noon.     [provider]  fluticasone (FLONASE) 50 MCG/ACT nasal spray Place 1 spray into both nostrils 2 (two) times daily. 10/24/17   [provider]  loratadine (CLARITIN) 10 MG tablet Take 10 mg by mouth daily.    [provider]  metoprolol tartrate (LOPRESSOR) 100 MG tablet Take 1 tablet (100 mg total) by mouth 2 (two) times daily. 06/10/21   Clegg, Amy D, NP  Multiple Vitamin (MULTIVITAMIN WITH MINERALS) TABS tablet Take 1 tablet by mouth daily at 12 noon.    [provider]  PHENobarbital (LUMINAL) 97.2 MG tablet Take 97.2 mg by mouth daily. Takes between 3:00-5:00pm.    [provider]  potassium chloride SA (KLOR-CON M) 20 MEQ tablet Take 1 tablet (20 mEq total) by mouth daily. 06/29/21   Bensimhon, Shaune Pascal, MD  pravastatin (PRAVACHOL) 40 MG tablet Take 40 mg by mouth at bedtime.     [provider]  spironolactone (ALDACTONE) 25 MG tablet Take 0.5 tablets (12.5 mg total) by mouth daily. 06/10/21   Clegg, Amy D, NP  torsemide (DEMADEX) 20  MG tablet Take 2 tablets (40 mg total) by mouth 2 (two) times daily. 07/15/21   Milford, Maricela Bo, FNP  triamcinolone cream (KENALOG) 0.1 % Apply 1 application. topically 2 (two) times daily. 05/13/21   [provider]      Allergies    Dilaudid [hydromorphone hcl], Hydromorphone, Morphine sulfate, Tegretol [carbamazepine], and Carbamazepine    Review of Systems   Review of Systems  Constitutional:  Positive for fatigue.  All other systems reviewed and are negative.  Physical Exam Updated Vital Signs BP 91/69   Pulse 100   Temp 98.4 F (36.9 C) (Oral)   Resp 20   Ht _0  (1.727 m)   Wt 104.3 kg   SpO2 93%   BMI 34.97 kg/m  Physical  Exam Vitals and nursing note reviewed.  Constitutional:      General: He is not in acute distress.    Appearance: He is well-developed.  HENT:     Head: Normocephalic and atraumatic.  Eyes:     Conjunctiva/sclera: Conjunctivae normal.  Cardiovascular:     Rate and Rhythm: Tachycardia present. Rhythm irregular.  Pulmonary:     Effort: Pulmonary effort is normal. No respiratory distress.     Breath sounds: Normal breath sounds.  Abdominal:     Palpations: Abdomen is soft.     Tenderness: There is no abdominal tenderness.  Musculoskeletal:        General: No swelling.     Cervical back: Neck supple.  Skin:    General: Skin is warm and dry.     Capillary Refill: Capillary refill takes less than 2 seconds.  Neurological:     Mental Status: He is alert.  Psychiatric:        Mood and Affect: Mood normal.    ED Results / Procedures / Treatments   Labs (all labs ordered are listed, but only abnormal results are displayed) Labs Reviewed  BASIC METABOLIC PANEL - Abnormal; Notable for the following components:      Result Value   Glucose, Bld 123 (*)    BUN 36 (*)    Creatinine, Ser 1.73 (*)    Calcium 8.6 (*)    GFR, Estimated 40 (*)    All other components within normal limits  MAGNESIUM - Abnormal; Notable for the following components:   Magnesium 2.5 (*)    All other components within normal limits  CBC - Abnormal; Notable for the following components:   Hemoglobin 12.8 (*)    RDW 17.1 (*)    All other components within normal limits  BRAIN NATRIURETIC PEPTIDE - Abnormal; Notable for the following components:   B Natriuretic Peptide 753.5 (*)    All other components within normal limits  PROTIME-INR - Abnormal; Notable for the following components:   Prothrombin Time 37.5 (*)    INR 3.8 (*)    All other components within normal limits  RESP PANEL BY RT-PCR (FLU A&B, COVID) ARPGX2  PROTIME-INR  CBC  BASIC METABOLIC PANEL  TROPONIN I (HIGH SENSITIVITY)     EKG None  Radiology CT Angio Chest PE W and/or Wo Contrast  Result Date: 07/26/2021 CLINICAL DATA:  Pulmonary embolism suspected, high probability. Weakness, tachycardia. EXAM: CT ANGIOGRAPHY CHEST WITH CONTRAST TECHNIQUE: Multidetector CT imaging of the chest was performed using the standard protocol during bolus administration of intravenous contrast. Multiplanar CT image reconstructions and MIPs were obtained to evaluate the vascular anatomy. RADIATION DOSE REDUCTION: This exam was performed according to the departmental dose-optimization program  which includes automated exposure control, adjustment of the mA and/or kV according to patient size and/or use of iterative reconstruction technique. CONTRAST:  82m OMNIPAQUE IOHEXOL 350 MG/ML SOLN COMPARISON:  04/25/2016. FINDINGS: Cardiovascular: The heart is enlarged and there is no pericardial effusion. Scattered coronary artery calcifications are noted. There is atherosclerotic calcification of the aorta without evidence of aneurysm. The pulmonary trunk is normal in caliber. A few small segmental and subsegmental pulmonary artery filling defects are noted in the left lower lobe, right lower lobe, and right upper lobe. The right ventricle is distended suggesting underlying right heart strain. Mediastinum/Nodes: No mediastinal, hilar, or axillary lymphadenopathy. The thyroid gland, trachea, and esophagus are within normal limits. Lungs/Pleura: Atelectasis is noted bilaterally. No effusion or pneumothorax. Upper Abdomen: There is reflux of contrast into the inferior vena cava and hepatic veins, which may be associated with right heart failure. No acute abnormality. Musculoskeletal: Mild degenerative changes are present in the thoracic spine. No acute osseous abnormality. Review of the MIP images confirms the above findings. IMPRESSION: 1. Small segmental and subsegmental pulmonary emboli bilaterally. The right ventricle is distended which may be associated  with underlying right heart strain. 2. Cardiomegaly with coronary artery calcifications. 3. Reflux of contrast into the inferior vena cava and hepatic veins, may be associated with right heart failure. 4. Aortic atherosclerosis. Critical findings reported to Dr. JRegan Lemmingat 9:55 p.m. Electronically Signed   By: LBrett FairyM.D.   On: 07/26/2021 21:57   DG Chest Portable 1 View  Result Date: 07/26/2021 CLINICAL DATA:  Near syncope EXAM: PORTABLE CHEST 1 VIEW COMPARISON:  05/17/2021, 04/19/2021, 10/29/2017 FINDINGS: Globular cardiac enlargement. No consolidation, pleural effusion, or pneumothorax. IMPRESSION: 1. Cardiomegaly with globular cardiac configuration as before which could be due to multi chamber enlargement or potential pericardial fluid. 2. No acute airspace disease Electronically Signed   By: KDonavan FoilM.D.   On: 07/26/2021 18:47    Procedures .Critical Care Performed by: LRegan Lemming MD Authorized by: LRegan Lemming MD   Critical care provider statement:    Critical care time (minutes):  30   Critical care was necessary to treat or prevent imminent or life-threatening deterioration of the following conditions:  Cardiac failure   Critical care was time spent personally by me on the following activities:  Development of treatment plan with patient or surrogate, discussions with consultants, evaluation of patient's response to treatment, examination of patient, ordering and review of laboratory studies, ordering and review of radiographic studies, ordering and performing treatments and interventions, pulse oximetry, re-evaluation of patient's condition and review of old charts    Medications Ordered in ED Medications  metoprolol tartrate (LOPRESSOR) tablet 100 mg (100 mg Oral Given 07/26/21 2104)  metoprolol tartrate (LOPRESSOR) injection 5 mg (5 mg Intravenous Given 07/26/21 1834)  lactated ringers bolus 500 mL (0 mLs Intravenous Stopped 07/26/21 2027)  iohexol (OMNIPAQUE)  350 MG/ML injection 80 mL (80 mLs Intravenous Contrast Given 07/26/21 2138)    ED Course/ Medical Decision Making/ A&P                           Medical Decision Making Amount and/or Complexity of Data Reviewed Labs: ordered. Radiology: ordered.  Risk Prescription drug management. Decision regarding hospitalization.   76year old male with a history of atrial fibrillation on warfarin who presents to the emergency department with generalized fatigue and weakness.  EMS was called out to the patient's house and the patient was found  to be in atrial fibrillation with RVR with rates in the 140s.  The patient states that he is on metoprolol 100 mg twice daily at baseline.  The patient denies any chest pain or shortness of breath.  He denies any cough.  He denies any fevers or chills.  He feels somewhat dehydrated.  He states that he was diagnosed with CHF and is on a diuretic.  On chart review, the patient was seen in the advanced heart failure clinic on 07/15/2021 for new diagnosis of CHF likely due to NICM with a most recent echocardiogram with an EF of 20 to 25%.  Left heart cath in 2021 showed normal coronary arteries.  Per cardiology notes he should be on torsemide 40 mg twice daily.  He states that the diuretic keeps him up throughout the night.  On arrival, the patient was afebrile, hemodynamically stable, saturating well on room air.  The patient was intermittently in atrial fibrillation with RVR on cardiac telemetry.  5 mg of IV metoprolol was administered with subsequent improvement in rate control.  The patient is anticoagulated on warfarin.  He has been previously considered for AV nodal ablation due to poor tolerance of episodes of atrial fibrillation with RVR given his advanced CHF.  Etiology of his CHF has been thought to be due to nonischemic cardiomyopathy from tachycardic episodes associated with atrial fibrillation.  Initial EKG revealed atrial fibrillation, ventricular rate 108, no  significant changes from prior EKGs, PVCs present.  No STEMI.  Chest x-ray was reviewed and interpreted by myself in addition to radiology which revealed an enlarged heart, no's clear pulmonary edema.  CTA PE imaging was ordered to evaluate for PE versus other acute abnormality such as pericardial effusion, pneumonia.  Laboratory evaluation significant for magnesium 2.5, CBC without a leukocytosis, mild anemia to 12.8, BNP mildly elevated to 754, BMP concerning for an AKI with an elevated BUN to 36 and a creatinine of 1.73 from a baseline of around 1.  Given the patient's AKI, known CHF, I did consider hypovolemia in the setting of overdiuresis.  A 500 cc IV fluid bolus challenge was administered with improvement in patient volume status.  He had been mildly borderline hypotensive following initial 5 mg of IV metoprolol for rate control.  Cardiology consultation was ordered given the patient's heart failure and AKI.  My concern is the patient will need admission for further observation, possible gentle fluid rehydration given his CHF versus observation and monitoring of the patient's renal function.  9:05PM Spoke with Dr. Alfred Levins of on-call cardiology who recommended medicine admission with cardiology consultation as needed.  The patient's coagulation studies resulted with supratherapeutic INR with an INR of 3.8.  The patient has been compliant with his warfarin doses of 6 mg in the late.  He states he has not missed any doses.  COVID-19 influenza PCR testing was collected and resulted negative.  Additional laboratory work-up significant for BNP elevated at 754.  Cardiac troponins were collected and pending.  CT angiogram to evaluate for PE was performed and resulted positive for pulmonary small segmental and subsegmental pulmonary emboli bilaterally.  IMPRESSION:  1. Small segmental and subsegmental pulmonary emboli bilaterally.  The right ventricle is distended which may be associated with  underlying  right heart strain.  2. Cardiomegaly with coronary artery calcifications.  3. Reflux of contrast into the inferior vena cava and hepatic veins,  may be associated with right heart failure.  4. Aortic atherosclerosis.       Given  the patient's atrial fibrillation with RVR, AKI, known CHF with an EF of 20 to 25%, new diagnosis of breakthrough PEs despite supratherapeutic INR while on Coumadin, hospitalist medicine was consulted for admission.  We will hold off on heparin administration at this time as the patient currently has a supratherapeutic INR. Dr. Marlyce Huge accepted the patient in admission.  On admission, the patient was saturating between 93 to 99% on room air, hemodynamically stable without an oxygen requirement.   Final Clinical Impression(s) / ED Diagnoses Final diagnoses:  Atrial fibrillation with RVR ([place])  AKI (acute kidney injury) ([place])  Acute pulmonary embolism, unspecified pulmonary embolism type, unspecified whether acute cor pulmonale present (H[facility])  Congestive heart failure, unspecified HF chronicity, unspecified heart failure type [facility])    Rx / DC Orders ED Discharge Orders     None         Regan Lemming, MD 07/26/21 2340

## 2021-07-27 ENCOUNTER — Other Ambulatory Visit (HOSPITAL_COMMUNITY): Payer: Self-pay

## 2021-07-27 ENCOUNTER — Encounter (HOSPITAL_COMMUNITY): Payer: Self-pay | Admitting: Internal Medicine

## 2021-07-27 ENCOUNTER — Inpatient Hospital Stay (HOSPITAL_COMMUNITY): Payer: Medicare PPO

## 2021-07-27 ENCOUNTER — Telehealth (HOSPITAL_COMMUNITY): Payer: Self-pay | Admitting: Surgery

## 2021-07-27 DIAGNOSIS — I4891 Unspecified atrial fibrillation: Secondary | ICD-10-CM

## 2021-07-27 DIAGNOSIS — I5023 Acute on chronic systolic (congestive) heart failure: Secondary | ICD-10-CM

## 2021-07-27 DIAGNOSIS — N179 Acute kidney failure, unspecified: Secondary | ICD-10-CM | POA: Diagnosis not present

## 2021-07-27 DIAGNOSIS — R0602 Shortness of breath: Secondary | ICD-10-CM

## 2021-07-27 DIAGNOSIS — J449 Chronic obstructive pulmonary disease, unspecified: Secondary | ICD-10-CM

## 2021-07-27 DIAGNOSIS — I2699 Other pulmonary embolism without acute cor pulmonale: Secondary | ICD-10-CM

## 2021-07-27 DIAGNOSIS — E782 Mixed hyperlipidemia: Secondary | ICD-10-CM

## 2021-07-27 DIAGNOSIS — G4733 Obstructive sleep apnea (adult) (pediatric): Secondary | ICD-10-CM

## 2021-07-27 DIAGNOSIS — M7989 Other specified soft tissue disorders: Secondary | ICD-10-CM | POA: Diagnosis not present

## 2021-07-27 DIAGNOSIS — I10 Essential (primary) hypertension: Secondary | ICD-10-CM

## 2021-07-27 DIAGNOSIS — Z9989 Dependence on other enabling machines and devices: Secondary | ICD-10-CM

## 2021-07-27 LAB — CBC WITH DIFFERENTIAL/PLATELET
Abs Immature Granulocytes: 0.03 10*3/uL (ref 0.00–0.07)
Basophils Absolute: 0 10*3/uL (ref 0.0–0.1)
Basophils Relative: 1 %
Eosinophils Absolute: 0.2 10*3/uL (ref 0.0–0.5)
Eosinophils Relative: 2 %
HCT: 38.6 % — ABNORMAL LOW (ref 39.0–52.0)
Hemoglobin: 12.4 g/dL — ABNORMAL LOW (ref 13.0–17.0)
Immature Granulocytes: 0 %
Lymphocytes Relative: 28 %
Lymphs Abs: 2.4 10*3/uL (ref 0.7–4.0)
MCH: 29.7 pg (ref 26.0–34.0)
MCHC: 32.1 g/dL (ref 30.0–36.0)
MCV: 92.6 fL (ref 80.0–100.0)
Monocytes Absolute: 0.7 10*3/uL (ref 0.1–1.0)
Monocytes Relative: 8 %
Neutro Abs: 5.2 10*3/uL (ref 1.7–7.7)
Neutrophils Relative %: 61 %
Platelets: 134 10*3/uL — ABNORMAL LOW (ref 150–400)
RBC: 4.17 MIL/uL — ABNORMAL LOW (ref 4.22–5.81)
RDW: 16.9 % — ABNORMAL HIGH (ref 11.5–15.5)
WBC: 8.5 10*3/uL (ref 4.0–10.5)
nRBC: 0 % (ref 0.0–0.2)

## 2021-07-27 LAB — CBC
HCT: 37.2 % — ABNORMAL LOW (ref 39.0–52.0)
Hemoglobin: 12.2 g/dL — ABNORMAL LOW (ref 13.0–17.0)
MCH: 30.2 pg (ref 26.0–34.0)
MCHC: 32.8 g/dL (ref 30.0–36.0)
MCV: 92.1 fL (ref 80.0–100.0)
Platelets: 129 10*3/uL — ABNORMAL LOW (ref 150–400)
RBC: 4.04 MIL/uL — ABNORMAL LOW (ref 4.22–5.81)
RDW: 17 % — ABNORMAL HIGH (ref 11.5–15.5)
WBC: 7.2 10*3/uL (ref 4.0–10.5)
nRBC: 0 % (ref 0.0–0.2)

## 2021-07-27 LAB — COMPREHENSIVE METABOLIC PANEL
ALT: 17 U/L (ref 0–44)
AST: 23 U/L (ref 15–41)
Albumin: 3.4 g/dL — ABNORMAL LOW (ref 3.5–5.0)
Alkaline Phosphatase: 122 U/L (ref 38–126)
Anion gap: 8 (ref 5–15)
BUN: 30 mg/dL — ABNORMAL HIGH (ref 8–23)
CO2: 30 mmol/L (ref 22–32)
Calcium: 8.7 mg/dL — ABNORMAL LOW (ref 8.9–10.3)
Chloride: 101 mmol/L (ref 98–111)
Creatinine, Ser: 1.7 mg/dL — ABNORMAL HIGH (ref 0.61–1.24)
GFR, Estimated: 41 mL/min — ABNORMAL LOW (ref >60)
Glucose, Bld: 96 mg/dL (ref 70–99)
Potassium: 4.2 mmol/L (ref 3.5–5.1)
Sodium: 139 mmol/L (ref 135–145)
Total Bilirubin: 0.5 mg/dL (ref 0.3–1.2)
Total Protein: 6.5 g/dL (ref 6.5–8.1)

## 2021-07-27 LAB — URINALYSIS, ROUTINE W REFLEX MICROSCOPIC
Bilirubin Urine: NEGATIVE
Glucose, UA: 150 mg/dL — AB
Hgb urine dipstick: NEGATIVE
Ketones, ur: NEGATIVE mg/dL
Leukocytes,Ua: NEGATIVE
Nitrite: NEGATIVE
Protein, ur: NEGATIVE mg/dL
Specific Gravity, Urine: 1.021 (ref 1.005–1.030)
pH: 5 (ref 5.0–8.0)

## 2021-07-27 LAB — PROTIME-INR
INR: 3.7 — ABNORMAL HIGH (ref 0.8–1.2)
INR: 2.8 — ABNORMAL HIGH (ref 0.8–1.2)
Prothrombin Time: 36.6 seconds — ABNORMAL HIGH (ref 11.4–15.2)
Prothrombin Time: 29.2 seconds — ABNORMAL HIGH (ref 11.4–15.2)

## 2021-07-27 LAB — TROPONIN I (HIGH SENSITIVITY)
Troponin I (High Sensitivity): 7 ng/L (ref <18)
Troponin I (High Sensitivity): 7 ng/L (ref <18)

## 2021-07-27 LAB — BASIC METABOLIC PANEL
Anion gap: 8 (ref 5–15)
BUN: 28 mg/dL — ABNORMAL HIGH (ref 8–23)
CO2: 32 mmol/L (ref 22–32)
Calcium: 8.5 mg/dL — ABNORMAL LOW (ref 8.9–10.3)
Chloride: 101 mmol/L (ref 98–111)
Creatinine, Ser: 1.48 mg/dL — ABNORMAL HIGH (ref 0.61–1.24)
GFR, Estimated: 48 mL/min — ABNORMAL LOW (ref >60)
Glucose, Bld: 141 mg/dL — ABNORMAL HIGH (ref 70–99)
Potassium: 3.2 mmol/L — ABNORMAL LOW (ref 3.5–5.1)
Sodium: 141 mmol/L (ref 135–145)

## 2021-07-27 LAB — MAGNESIUM: Magnesium: 2.4 mg/dL (ref 1.7–2.4)

## 2021-07-27 LAB — ECHOCARDIOGRAM COMPLETE
AR max vel: 2.66 cm2
AV Area VTI: 2.5 cm2
AV Area mean vel: 2.13 cm2
AV Mean grad: 1 mmHg
AV Peak grad: 1.8 mmHg
AV Vena cont: 0.5 cm
Ao pk vel: 0.66 m/s
Height: 68 in
S' Lateral: 5.3 cm
Single Plane A4C EF: 38.7 %
Weight: 3713.6 oz

## 2021-07-27 LAB — HEPARIN LEVEL (UNFRACTIONATED)
Heparin Unfractionated: 0.17 IU/mL — ABNORMAL LOW (ref 0.30–0.70)
Heparin Unfractionated: 0.59 IU/mL (ref 0.30–0.70)

## 2021-07-27 MED ORDER — ACETAMINOPHEN 325 MG PO TABS
650.0000 mg | ORAL_TABLET | Freq: Four times a day (QID) | ORAL | Status: DC | PRN
  Administered 2021-08-01 (×2): 650 mg via ORAL
  Filled 2021-07-27 (×2): qty 2

## 2021-07-27 MED ORDER — ADULT MULTIVITAMIN W/MINERALS CH
1.0000 | ORAL_TABLET | Freq: Every day | ORAL | Status: DC
  Administered 2021-07-27 – 2021-08-01 (×6): 1 via ORAL
  Filled 2021-07-27 (×6): qty 1

## 2021-07-27 MED ORDER — HEPARIN (PORCINE) 25000 UT/250ML-% IV SOLN
1600.0000 [IU]/h | INTRAVENOUS | Status: DC
  Administered 2021-07-27: 1650 [IU]/h via INTRAVENOUS
  Administered 2021-07-27: 1400 [IU]/h via INTRAVENOUS
  Filled 2021-07-27 (×2): qty 250

## 2021-07-27 MED ORDER — FLUTICASONE PROPIONATE 50 MCG/ACT NA SUSP: 1.0000 | Freq: Two times a day (BID) | NASAL | Status: DC | PRN

## 2021-07-27 MED ORDER — PRAVASTATIN SODIUM 40 MG PO TABS
40.0000 mg | ORAL_TABLET | Freq: Every day | ORAL | Status: DC
  Administered 2021-07-28 – 2021-07-31 (×4): 40 mg via ORAL
  Filled 2021-07-27 (×4): qty 1

## 2021-07-27 MED ORDER — LORATADINE 10 MG PO TABS
10.0000 mg | ORAL_TABLET | Freq: Every day | ORAL | Status: DC
  Administered 2021-07-27 – 2021-08-01 (×6): 10 mg via ORAL
  Filled 2021-07-27 (×6): qty 1

## 2021-07-27 MED ORDER — POTASSIUM CHLORIDE CRYS ER 20 MEQ PO TBCR
60.0000 meq | EXTENDED_RELEASE_TABLET | Freq: Once | ORAL | Status: AC
Start: 2021-07-27 — End: 2021-07-27
  Administered 2021-07-27: 60 meq via ORAL
  Filled 2021-07-27: qty 3

## 2021-07-27 MED ORDER — ALBUTEROL SULFATE (2.5 MG/3ML) 0.083% IN NEBU: 2.5000 mg | INHALATION_SOLUTION | RESPIRATORY_TRACT | Status: DC | PRN

## 2021-07-27 MED ORDER — HYDRALAZINE HCL 20 MG/ML IJ SOLN: 10.0000 mg | Freq: Four times a day (QID) | INTRAMUSCULAR | Status: DC | PRN

## 2021-07-27 MED ORDER — POLYETHYLENE GLYCOL 3350 17 G PO PACK
17.0000 g | PACK | Freq: Every day | ORAL | Status: DC | PRN
  Administered 2021-07-29: 17 g via ORAL
  Filled 2021-07-27: qty 1

## 2021-07-27 MED ORDER — ACETAMINOPHEN 650 MG RE SUPP: 650.0000 mg | Freq: Four times a day (QID) | RECTAL | Status: DC | PRN

## 2021-07-27 MED ORDER — FUROSEMIDE 10 MG/ML IJ SOLN
60.0000 mg | Freq: Two times a day (BID) | INTRAMUSCULAR | Status: DC
  Administered 2021-07-27 – 2021-07-29 (×5): 60 mg via INTRAVENOUS
  Filled 2021-07-27 (×5): qty 6

## 2021-07-27 MED ORDER — EMPAGLIFLOZIN 10 MG PO TABS: 10.0000 mg | ORAL_TABLET | Freq: Every day | ORAL | Status: DC

## 2021-07-27 MED ORDER — PHENOBARBITAL 97.2 MG PO TABS
97.2000 mg | ORAL_TABLET | Freq: Every day | ORAL | Status: DC
  Administered 2021-07-27 – 2021-07-31 (×5): 97.2 mg via ORAL
  Filled 2021-07-27 (×5): qty 1

## 2021-07-27 NOTE — Consult Note (Addendum)
Cardiology Consultation:   Patient ID: DEVEN FURIA MRN: [id number]; DOB: 05-Sep-1945  Admit date: 07/26/2021 Date of Consult: 07/27/2021  PCP:  [name], MD   [facility] Providers Cardiologist:  [name], MD  Electrophysiologist:  [name], MD    Patient Profile:   HAMMAD FINKLER is a 76 y.o. male with a hx of permanent Afib, HFrEF/NICM, HTN, OSA, seizure, COPD, OSA and  who is being seen 07/27/2021 for the evaluation of Afib at the request of Dr. Dwyane Dee.  History of Present Illness:   Mr. Nienhuis is a 76 year old male with past medical history noted above.  Diagnosed with atrial fibrillation in 2018 shortly after having pneumonia with an EF of 45% at that time.  Initially cardioverted to sinus rhythm with diltiazem and then medications adjusted given his LV dysfunction.  He has been anticoagulated with Coumadin in the setting of drug interaction between Eliquis/Xarelto and phenobarbital.  His PCP was following his INR.  He was started on amiodarone in 2020 and has required several cardioversions over the years.  He was admitted 10/2019 with lower extremity cellulitis, COVID atrial flutter and EF found to be 20-25%.  Cardiac catheterization showed normal coronaries.  And an outpatient follow-up 03/2020 he was noted to be out of rhythm again but did not follow-up with planned cardioversion.  He was lost to follow-up between January 2022 in February 2023 when he returned to the hospital with worsening dyspnea found to be in rapid A-fib and heart failure.  Stated he had been under a lot of increased stress with his wife as he was her primary caregiver.  He was diuresed with IV Lasix.  Atrial fibrillation was treated with rate controlling strategy with metoprolol and digoxin.  Amiodarone was stopped given his chronic A-fib.  He was seen by Dr. Curt Bears as an outpatient with plans for CRT-D implant with plans to consider AV node ablation.  This was scheduled but ultimately  canceled due to patient having bedbugs.  Admitted 05/17/2021 with A-fib RVR and acute on chronic CHF.  Reportedly never picked up his digoxin, Toprol or Jardiance after discharge.  He was diuresed with IV Lasix.  Discharged on Lopressor 100 mg twice daily, digoxin 0.25 mcg and torsemide 40 mg twice daily. He was referred to paramedicine.  Last seen in the advanced heart failure clinic 07/15/2021 and reported feeling more short of breath with walking, denied palpitations.  States he was taking all his home medications.  Reported he was no longer driving and would need transportation assistance.  He was instructed to decrease his torsemide to 40 mg twice daily as his Reds clip was 23%, continued on Jardiance, metoprolol 100 mg twice daily, Spiro 12.5 mg daily.  Plans to start on losartan at next visit, Entresto not feasible due to cost.   He was brought to the ED on 5/23 via EMS with rapid atrial fibrillation after visit with paramedics and noted his heart rate in the 120s.  On arrival he was noted to have heart rate in the 150s.  Labs on admission showed sodium 137, potassium 3.9, creatinine 1.7, magnesium 2.5, BNP 753, WBC 9.1, hemoglobin 12.8, INR 3.8.  Chest x-ray with possible pericardial fluid.  CT angio with small segmental and subsegmental PE bilaterally, right ventricle distended possible right heart strain.  He was started on IV heparin.  Cardiology now asked to see in regards to atrial fibrillation with RVR.   Past Medical History:  Diagnosis Date   Acquired thrombophilia (  [place]) 01/15/2019   Aortic atherosclerosis ([place])    Aortic root dilatation (HCC)    CAP (community acquired pneumonia) 03/06/2016   Chronic laryngitis 91/47/8295   Chronic systolic CHF (congestive heart failure) ([place])    COPD GOLD II  08/02/2016   Quit smoking 2000  PFT's  07/10/2016  FEV1 1.98 (70 % ) ratio 67  p 19 % improvement from saba p nothing  prior to study while of coreg so rec as of 08/02/2016 try off coreg and on  bisoprolol      Essential hypertension 03/06/2016   Changed from coreg to bisoprolol due to copd with reversible component  08/02/2016 >>>    Hoarseness 08/31/2016   Referred to ent 08/31/2016 >>> seen 10/03/16 [name] dx gerd and rhinitis medicamentosa   > improved on f/u 01/31/17 on gerd rx/ flonase and off afrin   Hyperlipidemia    Hypertension    Laryngopharyngeal reflux (LPR) 10/03/2016   Mitral regurgitation    Moderate persistent asthma 08/02/2016   Morbid (severe) obesity due to excess calories ([place]) 08/02/2016   Nasal polyps 10/03/2016   NICM (nonischemic cardiomyopathy) ([place])    a. EF 40-45% in 04/2016, etiology not defined, managed medically.   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   Rhinitis medicamentosa 10/03/2016   Right thyroid nodule 03/06/2016   Seizures ([place])    "take RX daily" (03/06/2016)   Sensorineural hearing loss (SNHL) of both ears 01/31/2017   Tricuspid regurgitation     Past Surgical History:  Procedure Laterality Date   CARDIOVERSION N/A 12/13/2017   Procedure: CARDIOVERSION;  Surgeon: [name], MD;  Location: [place];  Service: Cardiovascular;  Laterality: N/A;   CARDIOVERSION N/A 09/25/2018   Procedure: CARDIOVERSION;  Surgeon: [name], MD;  Location: [facility] ENDOSCOPY;  Service: Cardiovascular;  Laterality: N/A;   CARDIOVERSION N/A 11/04/2018   Procedure: CARDIOVERSION;  Surgeon: [name], MD;  Location: [facility] ENDOSCOPY;  Service: Cardiovascular;  Laterality: N/A;   CARDIOVERSION N/A 04/11/2019   Procedure: CARDIOVERSION;  Surgeon: [name], MD;  Location: [facility] ENDOSCOPY;  Service: Cardiovascular;  Laterality: N/A;   CIRCUMCISION     JOINT REPLACEMENT     PERCUTANEOUS PINNING TOE FRACTURE Left    "big toe   REPLACEMENT TOTAL KNEE Left    RIGHT/LEFT HEART CATH AND CORONARY ANGIOGRAPHY N/A 11/06/2019   Procedure: RIGHT/LEFT HEART  CATH AND CORONARY ANGIOGRAPHY;  Surgeon: [name], MD;  Location: [facility] CV LAB;  Service: Cardiovascular;  Laterality: N/A;   TONSILLECTOMY AND ADENOIDECTOMY       Home Medications:  Prior to Admission medications   Medication Sig Start Date End Date Taking? Authorizing Provider  acetaminophen (TYLENOL) 500 MG tablet Take 1,000 mg by mouth at bedtime as needed for mild pain or headache.   Yes [provider]  Carboxymethylcellulose Sod PF 0.5 % SOLN Place 1 drop into both eyes 4 (four) times daily as needed (dry eyes).   Yes [provider]  Cholecalciferol (VITAMIN D3) 25 MCG (1000 UT) CAPS Take 1,000 Units by mouth daily.   Yes [provider]  empagliflozin (JARDIANCE) 10 MG TABS tablet Take 1 tablet (10 mg total) by mouth daily before breakfast. 06/29/21  Yes Bensimhon, Shaune Pascal, MD  escitalopram (LEXAPRO) 20 MG tablet Take 20 mg by mouth daily. Takes at bedtime.   Yes [provider]  finasteride (PROSCAR) 5 MG tablet Take 5 mg by mouth daily at 12 noon.    Yes [provider]  fluticasone (FLONASE) 50 MCG/ACT nasal spray Place 1 spray into both nostrils 2 (two) times daily. 10/24/17  Yes [provider]  loratadine (CLARITIN) 10 MG tablet Take 10 mg by mouth daily.   Yes [provider]  metoprolol tartrate (LOPRESSOR) 100 MG tablet Take 1 tablet (100 mg total) by mouth 2 (two) times daily. 06/10/21  Yes Clegg, Amy D, NP  Multiple Vitamin (MULTIVITAMIN WITH MINERALS) TABS tablet Take 1 tablet by mouth daily at 12 noon.   Yes [provider]  PHENobarbital (LUMINAL) 97.2 MG tablet Take 97.2 mg by mouth daily. Takes between 3:00-5:00pm.   Yes [provider]  potassium chloride SA (KLOR-CON M) 20 MEQ tablet Take 1 tablet (20 mEq total) by mouth daily. 06/29/21  Yes Bensimhon, Shaune Pascal, MD  pravastatin (PRAVACHOL) 40 MG tablet Take 40 mg by mouth at bedtime.    Yes [provider]  spironolactone  (ALDACTONE) 25 MG tablet Take 0.5 tablets (12.5 mg total) by mouth daily. 06/10/21  Yes Clegg, Amy D, NP  torsemide (DEMADEX) 20 MG tablet Take 2 tablets (40 mg total) by mouth 2 (two) times daily. 07/15/21  Yes Milford, Maricela Bo, FNP  triamcinolone cream (KENALOG) 0.1 % Apply 1 application. topically 2 (two) times daily as needed (rash). 05/13/21  Yes [provider]  warfarin (COUMADIN) 5 MG tablet Take 2.5-5 mg by mouth See admin instructions. Taking 2.5 mg ( 1/2 tablet) on Monday only. All other days of the week taking 5 mg. 11/22/20  Yes [provider]    Inpatient Medications: Scheduled Meds:  furosemide  60 mg Intravenous BID   loratadine  10 mg Oral Daily   metoprolol tartrate  100 mg Oral BID   multivitamin with minerals  1 tablet Oral Q1200   phenobarbital  97.2 mg Oral QAC supper   potassium chloride  60 mEq Oral Once   [START ON 07/28/2021] pravastatin  40 mg Oral QHS   Continuous Infusions:  heparin 1,400 Units/hr (07/27/21 0430)   PRN Meds: acetaminophen **OR** acetaminophen, albuterol, fluticasone, hydrALAZINE, polyethylene glycol  Allergies:    Allergies  Allergen Reactions   Dilaudid [Hydromorphone Hcl] Other (See Comments)    Makes pt hyper    Hydromorphone Other (See Comments)    hyperactiviity Other reaction(s): made him wild Other reaction(s): Unknown   Morphine Sulfate     Other reaction(s): at high dose causes confusion   Tegretol [Carbamazepine] Hives   Carbamazepine Hives and Rash    Other reaction(s): hives Other reaction(s): Hives    Social History:   Social History   Socioeconomic History   Marital status: Married    Spouse name: Not on file   Number of children: Not on file   Years of education: Not on file   Highest education level: Not on file  Occupational History   Not on file  Tobacco Use   Smoking status: Former    Packs/day: 3.00    Years: 48.00    Pack years: 144.00    Types: Cigarettes    Quit date: 2000     Years since quitting: 23.4   Smokeless tobacco: Never  Vaping Use   Vaping Use: Never used  Substance and Sexual Activity   Alcohol use: Never   Drug use: Never   Sexual activity: Never  Other Topics Concern   Not on file  Social History Narrative   ** Merged History Encounter **       Social Determinants of Health   Financial Resource Strain: Not on file  Food Insecurity: No Food Insecurity   Worried About Charity fundraiser in the Last Year: Never true   Arboriculturist in the Last Year: Never true  Transportation Needs: No Transportation Needs   Lack of Transportation (Medical): No   Lack of Transportation (Non-Medical): No  Physical Activity: Not on file  Stress: Not on file  Social Connections: Not on file  Intimate Partner Violence: Not on file    Family History:    Family History  Problem Relation Age of Onset   Hypertension Mother    Other Father        sudden cardiac arrest     ROS:  Please see the history of present illness.   All other ROS reviewed and negative.     Physical Exam/Data:   Vitals:   07/27/21 0037 07/27/21 0528 07/27/21 0735 07/27/21 0942  BP: 108/76 110/74 105/71   Pulse: (!) 103 (!) 55 (!) 102 (!) 109  Resp: _0 Temp: 98.1 F (36.7 C) 97.6 F (36.4 C) 98.5 F (36.9 C)   TempSrc: Oral Oral Oral   SpO2: 97% 99% 95%   Weight: 105.3 kg     Height:        Intake/Output Summary (Last 24 hours) at 07/27/2021 1426 Last data filed at 07/27/2021 1142 Gross per 24 hour  Intake 267.38 ml  Output 1750 ml  Net -1482.62 ml      07/27/2021   12:37 AM 07/26/2021    5:52 PM 07/26/2021    5:05 PM  Last 3 Weights  Weight (lbs) 232 lb 1.6 oz 230 lb 229 lb  Weight (kg) 105.28 kg 104.327 kg 103.874 kg     Body mass index is 35.29 kg/m.  General:  Well nourished, well developed, in no acute distress HEENT: normal Neck: no JVD Vascular: No carotid bruits; Distal pulses 2+ bilaterally Cardiac:  normal S1, S2; Irreg Irreg; no murmur   Lungs:  faint crackles in bases Abd: soft, nontender, no hepatomegaly  Ext: 1+ LE edema Musculoskeletal:  No deformities, BUE and BLE strength normal and equal Skin: warm and dry  Neuro:  CNs 2-12 intact, no focal abnormalities noted Psych:  Normal affect   EKG:  The EKG was personally reviewed and demonstrates:  Afib 108 bpm Telemetry:  Telemetry was personally reviewed and demonstrates:  Afib rates 90-130s  Relevant CV Studies:  Echo: 04/17/21  IMPRESSIONS     1. Left ventricular ejection fraction, by estimation, is 20 to 25%. The  left ventricle has severely decreased function. The left ventricle  demonstrates global hypokinesis. The left ventricular internal cavity size  was moderately dilated. Left  ventricular diastolic parameters are indeterminate.   2. Right ventricular systolic function is mildly reduced. The right  ventricular size is mildly enlarged. There is moderately elevated  pulmonary artery systolic pressure. The estimated right ventricular  systolic pressure is 09.3 mmHg.   3. Left atrial size was moderately dilated.   4. Right atrial size was moderately dilated.   5. The mitral valve is normal in structure. Mild mitral valve  regurgitation. No evidence of mitral stenosis.   6. Tricuspid valve regurgitation is mild to moderate.   7. The aortic valve is tricuspid. Aortic valve regurgitation is mild. No  aortic stenosis is present.   8.  Aortic dilatation noted. There is mild dilatation of the aortic root,  measuring 39 mm.   9. The inferior vena cava is dilated in size with <50% respiratory  variability, suggesting right atrial pressure of 15 mmHg.   FINDINGS   Left Ventricle: Left ventricular ejection fraction, by estimation, is 20  to 25%. The left ventricle has severely decreased function. The left  ventricle demonstrates global hypokinesis. The left ventricular internal  cavity size was moderately dilated.  There is no left ventricular hypertrophy.  Left ventricular diastolic  parameters are indeterminate.   Right Ventricle: The right ventricular size is mildly enlarged. No  increase in right ventricular wall thickness. Right ventricular systolic  function is mildly reduced. There is moderately elevated pulmonary artery  systolic pressure. The tricuspid  regurgitant velocity is 2.85 m/s, and with an assumed right atrial  pressure of 15 mmHg, the estimated right ventricular systolic pressure is  16.1 mmHg.   Left Atrium: Left atrial size was moderately dilated.   Right Atrium: Right atrial size was moderately dilated.   Pericardium: There is no evidence of pericardial effusion.   Mitral Valve: The mitral valve is normal in structure. Mild mitral valve  regurgitation. No evidence of mitral valve stenosis.   Tricuspid Valve: The tricuspid valve is normal in structure. Tricuspid  valve regurgitation is mild to moderate.   Aortic Valve: The aortic valve is tricuspid. Aortic valve regurgitation is  mild. Aortic regurgitation PHT measures 447 msec. No aortic stenosis is  present.   Pulmonic Valve: The pulmonic valve was normal in structure. Pulmonic valve  regurgitation is trivial.   Aorta: Aortic dilatation noted. There is mild dilatation of the aortic  root, measuring 39 mm.   Venous: The inferior vena cava is dilated in size with less than 50%  respiratory variability, suggesting right atrial pressure of 15 mmHg.   IAS/Shunts: No atrial level shunt detected by color flow Doppler.   Laboratory Data:  High Sensitivity Troponin:   Recent Labs  Lab 07/26/21 2254 07/27/21 0205  TROPONINIHS 7 7     Chemistry Recent Labs  Lab 07/26/21 1757 07/27/21 0205 07/27/21 1231  NA 137 139 141  K 3.9 4.2 3.2*  CL 99 101 101  CO2 28 30 32  GLUCOSE 123* 96 141*  BUN 36* 30* 28*  CREATININE 1.73* 1.70* 1.48*  CALCIUM 8.6* 8.7* 8.5*  MG 2.5* 2.4  --   GFRNONAA 40* 41* 48*  ANIONGAP _0 Recent Labs  Lab  07/27/21 0205  PROT 6.5  ALBUMIN 3.4*  AST 23  ALT 17  ALKPHOS 122  BILITOT 0.5   Lipids No results for input(s): CHOL, TRIG, HDL, LABVLDL, LDLCALC, CHOLHDL in the last 168 hours.  Hematology Recent Labs  Lab 07/26/21 1757 07/27/21 0205 07/27/21 1231  WBC 9.1 8.5 7.2  RBC 4.27 4.17* 4.04*  HGB 12.8* 12.4* 12.2*  HCT 40.3 38.6* 37.2*  MCV 94.4 92.6 92.1  MCH 30.0 29.7 30.2  MCHC 31.8 32.1 32.8  RDW 17.1* 16.9* 17.0*  PLT 151 134* 129*   Thyroid No results for input(s): TSH, FREET4 in the last 168 hours.  BNP Recent Labs  Lab 07/26/21 1757  BNP 753.5*    DDimer No results for input(s): DDIMER in the last 168 hours.   Radiology/Studies:  CT Angio Chest PE W and/or Wo Contrast  Result Date: 07/26/2021 CLINICAL DATA:  Pulmonary embolism suspected, high probability. Weakness, tachycardia. EXAM: CT ANGIOGRAPHY CHEST WITH CONTRAST TECHNIQUE: Multidetector  CT imaging of the chest was performed using the standard protocol during bolus administration of intravenous contrast. Multiplanar CT image reconstructions and MIPs were obtained to evaluate the vascular anatomy. RADIATION DOSE REDUCTION: This exam was performed according to the departmental dose-optimization program which includes automated exposure control, adjustment of the mA and/or kV according to patient size and/or use of iterative reconstruction technique. CONTRAST:  43m OMNIPAQUE IOHEXOL 350 MG/ML SOLN COMPARISON:  04/25/2016. FINDINGS: Cardiovascular: The heart is enlarged and there is no pericardial effusion. Scattered coronary artery calcifications are noted. There is atherosclerotic calcification of the aorta without evidence of aneurysm. The pulmonary trunk is normal in caliber. A few small segmental and subsegmental pulmonary artery filling defects are noted in the left lower lobe, right lower lobe, and right upper lobe. The right ventricle is distended suggesting underlying right heart strain. Mediastinum/Nodes: No  mediastinal, hilar, or axillary lymphadenopathy. The thyroid gland, trachea, and esophagus are within normal limits. Lungs/Pleura: Atelectasis is noted bilaterally. No effusion or pneumothorax. Upper Abdomen: There is reflux of contrast into the inferior vena cava and hepatic veins, which may be associated with right heart failure. No acute abnormality. Musculoskeletal: Mild degenerative changes are present in the thoracic spine. No acute osseous abnormality. Review of the MIP images confirms the above findings. IMPRESSION: 1. Small segmental and subsegmental pulmonary emboli bilaterally. The right ventricle is distended which may be associated with underlying right heart strain. 2. Cardiomegaly with coronary artery calcifications. 3. Reflux of contrast into the inferior vena cava and hepatic veins, may be associated with right heart failure. 4. Aortic atherosclerosis. Critical findings reported to Dr. JRegan Lemmingat 9:55 p.m. Electronically Signed   By: LBrett FairyM.D.   On: 07/26/2021 21:57   DG Chest Portable 1 View  Result Date: 07/26/2021 CLINICAL DATA:  Near syncope EXAM: PORTABLE CHEST 1 VIEW COMPARISON:  05/17/2021, 04/19/2021, 10/29/2017 FINDINGS: Globular cardiac enlargement. No consolidation, pleural effusion, or pneumothorax. IMPRESSION: 1. Cardiomegaly with globular cardiac configuration as before which could be due to multi chamber enlargement or potential pericardial fluid. 2. No acute airspace disease Electronically Signed   By: KDonavan FoilM.D.   On: 07/26/2021 18:47   VAS [place]OWER EXTREMITY VENOUS (DVT)  Result Date: 07/27/2021  Lower Venous DVT Study Patient Name:  CDAVANTA MEUSER Date of Exam:   07/27/2021 Medical Rec #: [id number]       Accession #:    [id number] of Birth: 331-Mar-1947       Patient Gender: M Patient Age:   89years [facility]:      VAS [place]OWER EXTREMITY VENOUS (DVT) Referring Phys: GInda Merlin --------------------------------------------------------------------------------  Indications: Swelling, SOB.  Comparison       12-10-2019 Prior left lower extremity venous was negative for Study:           DVT. Performing Technologist: RDarlin CocoRDMS, RVT  Examination Guidelines: A complete evaluation includes B-mode imaging, spectral Doppler, color Doppler, and power Doppler as needed of all accessible portions of each vessel. Bilateral testing is considered an integral part of a complete examination. Limited examinations for reoccurring indications may be performed as noted. The reflux portion of the exam is performed with the patient in reverse Trendelenburg.  +---------+---------------+---------+-----------+----------+--------------+ RIGHT    CompressibilityPhasicitySpontaneityPropertiesThrombus Aging +---------+---------------+---------+-----------+----------+--------------+ CFV      Full           Yes      Yes                                 +---------+---------------+---------+-----------+----------+--------------+  SFJ      Full                                                        +---------+---------------+---------+-----------+----------+--------------+ FV Prox  Full                                                        +---------+---------------+---------+-----------+----------+--------------+ FV Mid   Full                                                        +---------+---------------+---------+-----------+----------+--------------+ FV DistalFull                                                        +---------+---------------+---------+-----------+----------+--------------+ PFV      Full                                                        +---------+---------------+---------+-----------+----------+--------------+ POP      Full           Yes      Yes                                  +---------+---------------+---------+-----------+----------+--------------+ PTV      Full                                                        +---------+---------------+---------+-----------+----------+--------------+ PERO     Full                                                        +---------+---------------+---------+-----------+----------+--------------+ Gastroc  Full                                                        +---------+---------------+---------+-----------+----------+--------------+   +---------+---------------+---------+-----------+----------+--------------+ LEFT     CompressibilityPhasicitySpontaneityPropertiesThrombus Aging +---------+---------------+---------+-----------+----------+--------------+ CFV      Full           Yes      Yes                                 +---------+---------------+---------+-----------+----------+--------------+  SFJ      Full                                                        +---------+---------------+---------+-----------+----------+--------------+ FV Prox  Full                                                        +---------+---------------+---------+-----------+----------+--------------+ FV Mid   Full                                                        +---------+---------------+---------+-----------+----------+--------------+ FV DistalFull                                                        +---------+---------------+---------+-----------+----------+--------------+ PFV      Full                                                        +---------+---------------+---------+-----------+----------+--------------+ POP      Full           Yes      Yes                                 +---------+---------------+---------+-----------+----------+--------------+ PTV      Full                                                         +---------+---------------+---------+-----------+----------+--------------+ PERO     Full                                                        +---------+---------------+---------+-----------+----------+--------------+ Gastroc  Full                                                        +---------+---------------+---------+-----------+----------+--------------+     Summary: RIGHT: - There is no evidence of deep vein thrombosis in the lower extremity.  - No cystic structure found in the popliteal fossa.  LEFT: - There is no evidence of deep vein thrombosis in the lower extremity.  - No cystic structure found in the popliteal  fossa.  *See table(s) above for measurements and observations.    Preliminary      Assessment and Plan:   JEROLD YOSS is a 76 y.o. male with a hx of permanent Afib, HFrEF/NICM, HTN, OSA, seizure, COPD, OSA and  who is being seen 07/27/2021 for the evaluation of Afib at the request of Dr. Dwyane Dee.  Permanent Afib: Heart rates have been variable during admission, blood pressures on the softer side. Has been on coumadin with supra-therapeutic INR of 3.8 on admission. Hx of multiple failed cardioversions. Planned for outpatient CRT-D +/- AV node ablation but rescheduled as patient had bed bugs. Now with elevated rates on the setting of CHF and acute PE on admission, though mostly controlled at rest.  -- continue on metoprolol 156m BID -- currently on IV heparin, coumadin held  Acute PE: bilateral small pulmonary emboli on CT despite INR of 3.8 on admission. Did report a subtherapeutic INR several weeks prior with coumadin increased.  -- currently on IV heparin -- hematology consulted per primary, has not been on DOAC in the past 2/2 drug interaction with phenobarbital  -- LE dopplers negative for DVT  HFrEF/NICM: known EF of 20-25%, recently had torsemide reduced at office visit with low REDs clip reading. BNP >700 on admission with LE edema. -- on IV lasix 647m BID -- metoprolol 10029mID, planned for losartan at next office visit (Entresto not affordable) -- would plan to resume jardiance and spiro pending stable renal function  -- repeat echo pending  Hx of Seizure: has been on phenobarbital -- difficult situation as he has now developed PE while on coumadin, but unable to use DOACs with pheno.   AKI: Cr 1.63>>1.70>>1.48 -- follow with diuresis  HTN: soft at times -- continue metoprolol 100m79mD  HLD: on statin  Hypokalemia: K+ 3.2, supplement -- follow BMET   Risk Assessment/Risk Scores:    New York Heart Association (NYHA) Functional Class NYHA Class II  CHA2DS2-VASc Score = 6   This indicates a 9.7% annual risk of stroke. The patient's score is based upon: CHF History: 1 HTN History: 1 Diabetes History: 0 Stroke History: 0 Vascular Disease History: 0 Age Score: 2 Gender Score: 0    For questions or updates, please contact [facility] consult [email or web address] for contact info under    Signed, LindReino Bellis  07/27/2021 2:26 PM  Personally seen and examined. Agree with above.  76 y69r old patient of mine with permanent atrial fibrillation challenging control with nonischemic cardiomyopathy. Heart rates originally were in the 50 range.  A PE CT was performed and showed small subsegmental PEs bilaterally.  Very small caliber vessels.  A few weeks ago he was subtherapeutic on Coumadin.  When he came in he was in the 3 range. Right heart strain was reported.  This is likely secondary to elevated RA pressures and the setting of his LV dysfunction.  Back in March his EF was 20 to 25%.  -For now continue with Coumadin.  Very small subsegmental PEs.  I do not think that his PEs are contributing to his RV strain. -Okay to continue with IV Lasix 60 twice daily.  Continue with metoprolol 100 twice daily.  Could not afford Entresto previously.  If able resume Jardiance and spironolactone.  -Unable to take another  anticoagulant because he is on phenobarbital which was started 20 years ago.  He has had seizures in the past.  -Previous plan in March was 2 proceed with AV nodal ablation  and biventricular pacemaker by Dr. Curt Bears.  This procedure was canceled at that time secondary to bedbug infestation.  [name], MD

## 2021-07-27 NOTE — Care Plan (Signed)
This 76 year old male with PMH significant of chronic atrial fibrillation (on coumadin), hypertension, hyperlipidemia, mild pulmonary hypertension, nonischemic cardiomyopathy (normal cath 06/1658) with systolic congestive heart failure (Echo 04/2021 EF 20-25%), pulmonary hypertension, obstructive sleep apnea on CPAP, obesity, COPD who presentd in the ED with complaints of generalized weakness.  EMS was called due to progressively worsening symptoms.  Patient was found to have A-fib with RVR with heart rate in 150s.  Upon evaluation in the ED found to have INR 3.8.  CTA chest shows small segmental subsegmental pulmonary emboli with right heart strain.  Patient is started on heparin gtt.  Hematology is consulted for anticoagulation, cardiology is consulted for A-fib with RVR. Patient was seen and examined, he  reports feeling better.

## 2021-07-27 NOTE — ED Notes (Signed)
Pt successfully transferred between bed and bedside commode independently.  His HR accelerated briefly into the 120's then settled back down

## 2021-07-27 NOTE — Assessment & Plan Note (Signed)
.   Continuing home regimen of CPAP nightly

## 2021-07-27 NOTE — Assessment & Plan Note (Deleted)
   Despite the rapid atrial fibrillation patient is not exhibiting significant evidence of volume overload  BNP is 753 which is only slightly elevated compared to his baseline  That being said, due to patient's progressive renal injury temporarily holding diuretics for now as patient is likely volume depleted  Continue strict input and output monitoring  Monitoring renal function electrolytes with serial chemistries  Daily weights

## 2021-07-27 NOTE — Progress Notes (Signed)
ANTICOAGULATION CONSULT NOTE -  Pharmacy Consult for heparin Indication: pulmonary embolus  Allergies  Allergen Reactions   Dilaudid [Hydromorphone Hcl] Other (See Comments)    Makes pt hyper    Hydromorphone Other (See Comments)    hyperactiviity Other reaction(s): made him wild Other reaction(s): Unknown   Morphine Sulfate     Other reaction(s): at high dose causes confusion   Tegretol [Carbamazepine] Hives   Carbamazepine Hives and Rash    Other reaction(s): hives Other reaction(s): Hives    Patient Measurements: Height: _0  (172.7 cm) Weight: 105.3 kg (232 lb 1.6 oz) IBW/kg (Calculated) : 68.4 Heparin Dosing Weight: 91kg  Vital Signs: Temp: 98.5 F (36.9 C) (05/24 0735) Temp Source: Oral (05/24 0735) BP: 105/71 (05/24 0735) Pulse Rate: 109 (05/24 0942)  Labs: Recent Labs    07/26/21 1757 07/26/21 2025 07/26/21 2254 07/27/21 0205 07/27/21 1231  HGB 12.8*  --   --  12.4* 12.2*  HCT 40.3  --   --  38.6* 37.2*  PLT 151  --   --  134* 129*  LABPROT  --  37.5*  --  36.6* 29.2*  INR  --  3.8*  --  3.7* 2.8*  HEPARINUNFRC  --   --   --   --  0.17*  CREATININE 1.73*  --   --  1.70* 1.48*  TROPONINIHS  --   --  7 7  --      Estimated Creatinine Clearance: 48.4 mL/min (A) (by C-G formula based on SCr of 1.48 mg/dL (H)).   Medical History: Past Medical History:  Diagnosis Date   Acquired thrombophilia ([place]) 01/15/2019   Aortic atherosclerosis ([place])    Aortic root dilatation ([facility])    CAP (community acquired pneumonia) 03/06/2016   Chronic laryngitis 37/90/2409   Chronic systolic CHF (congestive heart failure) ([place])    COPD GOLD II  08/02/2016   Quit smoking 2000  PFT's  07/10/2016  FEV1 1.98 (70 % ) ratio 67  p 19 % improvement from saba p nothing  prior to study while of coreg so rec as of 08/02/2016 try off coreg and on bisoprolol      Essential hypertension 03/06/2016   Changed from coreg to bisoprolol due to copd with reversible component  08/02/2016 >>>     Hoarseness 08/31/2016   Referred to ent 08/31/2016 >>> seen 10/03/16 [name] dx gerd and rhinitis medicamentosa   > improved on f/u 01/31/17 on gerd rx/ flonase and off afrin   Hyperlipidemia    Hypertension    Laryngopharyngeal reflux (LPR) 10/03/2016   Mitral regurgitation    Moderate persistent asthma 08/02/2016   Morbid (severe) obesity due to excess calories ([place]) 08/02/2016   Nasal polyps 10/03/2016   NICM (nonischemic cardiomyopathy) ([place])    a. EF 40-45% in 04/2016, etiology not defined, managed medically.   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  Discussed with MD and will proceed with heparin gtt without bolus considering new PE -heparin  level= 0.17, hg= 12.1, plt= 129, INR= 2.8  He is on phenobarbital for history of seizures. This medication can reduce efficacy of DOACs and most sources recommend avoiding DOACs in patients on phenobarbitital -cost of enoxaparin: $99 per month  Goal of Therapy:  Heparin level 0.3-0.7 units/ml Monitor platelets by anticoagulation protocol: Yes   Plan:  -Increase heparin to 1650 units/hr -Heparin level in 8 hours and daily wth CBC daily  Hildred Laser, PharmD Clinical Pharmacist **Pharmacist phone directory can now be found on [email or web address] ([email or web address]).  Listed under [place].

## 2021-07-27 NOTE — Telephone Encounter (Signed)
-----  Message from Rafael Bihari, [place] sent at 07/26/2021  4:17 PM EDT ----- Nira [name] with paramedicine notified of med changes. Will also hold spiro x 2 days, then resume at home dose of 12.5 daily.  Patient will need BMET in 10 days.

## 2021-07-27 NOTE — TOC Benefit Eligibility Note (Addendum)
Patient Teacher, English as a foreign language completed.    The patient is currently admitted and upon discharge could be taking Elquis 5 mg.  The current 30 day co-pay is, $47.00.   The patient is currently admitted and upon discharge could be taking Xarelto 20 mg.  The current 30 day co-pay is, $47.00.   The patient is currently admitted and upon discharge could be taking enoxaparin 100 mg/ml.  The current 30 day co-pay is, $99.00.   The patient is currently admitted and upon discharge could be taking enoxaparin 150 mg/ml.  The current 30 day co-pay is, $99.00.   The patient is insured through [place], [facility] Patient Advocate Specialist [facility] Patient Advocate Team Direct Number: [phone]  Fax: [phone]

## 2021-07-27 NOTE — H&P (Signed)
History and Physical    Patient: Ruben Murray MRN: [id number] DOA: 07/26/2021  Date of Service: the patient was seen and examined on 07/27/2021  Patient coming from: Home via EMS  Chief Complaint:  Chief Complaint  Patient presents with   Tachycardia    HPI:   76 year old male with past medical history of chronic atrial fibrillation (on coumadin), hypertension, hyperlipidemia, mild pulmonary hypertension, nonischemic cardiomyopathy (normal cath 04/5636) with systolic congestive heart failure (Echo 04/2021 EF 20-25%), pulmonary hypertension, obstructive sleep apnea on CPAP, obesity, COPD who presents to [facility] emergency department via EMS with complaints of generalized weakness.  Patient explains that for approximately the past 4 weeks he has been experiencing generalized weakness.  Generalized weakness was initially mild in intensity but progressively has become more and more severe over the past several weeks.  Generalized weakness is associated with dyspnea on exertion.  This has been quite severe patient denies chest pain, fevers, cough, sick contacts or recent travel.  Patient states that he is compliant with all his medications including his diuretics.  Of note, patient is a community para medicine program patient who has been helping to manage his congestive heart failure and atrial fibrillation.  Due to patient's progressively worsening symptoms EMS was contacted who promptly came to evaluate the patient at home finding that the patient was in rapid atrial fibrillation with heart rates in excess of 150 bpm.  Patient was promptly brought into [facility] emergency department for evaluation.  Upon evaluation in the emergency department patient was confirmed to be in rapid atrial fibrillation.  Initial work-up revealed a supratherapeutic INR of 3.8 with CT angiogram of the chest being performed revealing small segmental and subsegmental pulmonary emboli bilaterally  with question of right heart strain.  Due to generalized weakness, shortness of breath, rapid atrial fibrillation and bilateral pulmonary emboli the hospitalist group has now been called to assess the patient for admission to the hospital.  Review of Systems: Review of Systems  Respiratory:  Positive for shortness of breath.   Cardiovascular:  Positive for leg swelling.  Neurological:  Positive for weakness.    Past Medical History:  Diagnosis Date   Acquired thrombophilia ([place]) 01/15/2019   Aortic atherosclerosis ([place])    Aortic root dilatation (HCC)    CAP (community acquired pneumonia) 03/06/2016   Chronic laryngitis 93/73/4287   Chronic systolic CHF (congestive heart failure) ([place])    COPD GOLD II  08/02/2016   Quit smoking 2000  PFT's  07/10/2016  FEV1 1.98 (70 % ) ratio 67  p 19 % improvement from saba p nothing  prior to study while of coreg so rec as of 08/02/2016 try off coreg and on bisoprolol      Essential hypertension 03/06/2016   Changed from coreg to bisoprolol due to copd with reversible component  08/02/2016 >>>    Hoarseness 08/31/2016   Referred to ent 08/31/2016 >>> seen 10/03/16 [name] dx gerd and rhinitis medicamentosa   > improved on f/u 01/31/17 on gerd rx/ flonase and off afrin   Hyperlipidemia    Hypertension    Laryngopharyngeal reflux (LPR) 10/03/2016   Mitral regurgitation    Moderate persistent asthma 08/02/2016   Morbid (severe) obesity due to excess calories ([place]) 08/02/2016   Nasal polyps 10/03/2016   NICM (nonischemic cardiomyopathy) ([place])    a. EF 40-45% in 04/2016, etiology not defined, managed medically.   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   Rhinitis medicamentosa 10/03/2016   Right thyroid nodule 03/06/2016   Seizures ([place])    "take RX daily" (03/06/2016)   Sensorineural hearing loss (SNHL) of both ears 01/31/2017   Tricuspid regurgitation      Past Surgical History:  Procedure Laterality Date   CARDIOVERSION N/A 12/13/2017   Procedure: CARDIOVERSION;  Surgeon: [name], MD;  Location: [place];  Service: Cardiovascular;  Laterality: N/A;   CARDIOVERSION N/A 09/25/2018   Procedure: CARDIOVERSION;  Surgeon: [name], MD;  Location: [facility] ENDOSCOPY;  Service: Cardiovascular;  Laterality: N/A;   CARDIOVERSION N/A 11/04/2018   Procedure: CARDIOVERSION;  Surgeon: [name], MD;  Location: [facility] ENDOSCOPY;  Service: Cardiovascular;  Laterality: N/A;   CARDIOVERSION N/A 04/11/2019   Procedure: CARDIOVERSION;  Surgeon: [name], MD;  Location: [facility] ENDOSCOPY;  Service: Cardiovascular;  Laterality: N/A;   CIRCUMCISION     JOINT REPLACEMENT     PERCUTANEOUS PINNING TOE FRACTURE Left    "big toe   REPLACEMENT TOTAL KNEE Left    RIGHT/LEFT HEART CATH AND CORONARY ANGIOGRAPHY N/A 11/06/2019   Procedure: RIGHT/LEFT HEART CATH AND CORONARY ANGIOGRAPHY;  Surgeon: [name], MD;  Location: [place] CV LAB;  Service: Cardiovascular;  Laterality: N/A;   TONSILLECTOMY AND ADENOIDECTOMY      Social History:  reports that he quit smoking about 23 years ago. His smoking use included cigarettes. He has a 144.00 pack-year smoking history. He has never used smokeless tobacco. He reports that he does not drink alcohol and does not use drugs.  Allergies  Allergen Reactions   Dilaudid [Hydromorphone Hcl] Other (See Comments)    Makes pt hyper    Hydromorphone Other (See Comments)    hyperactiviity Other reaction(s): made him wild Other reaction(s): Unknown   Morphine Sulfate     Other reaction(s): at high dose causes confusion   Tegretol [Carbamazepine] Hives   Carbamazepine Hives and Rash    Other reaction(s): hives Other reaction(s): Hives    Family History  Problem Relation Age of Onset   Hypertension Mother    Other Father        sudden cardiac arrest    Prior to Admission medications   Medication  Sig Start Date End Date Taking? Authorizing Provider  warfarin (COUMADIN) 5 MG tablet Take 5 mg by mouth daily. Patient also takes 1 tablet by mouth daily with a total of 6 mg daily. 11/22/20  Yes [provider]  acetaminophen (TYLENOL) 500 MG tablet Take 1,000 mg by mouth at bedtime as needed for mild pain or headache.    [provider]  Carboxymethylcellulose Sod PF 0.5 % SOLN Place 1 drop into both eyes 4 (four) times daily as needed (dry eyes).    [provider]  Cholecalciferol (VITAMIN D3) 25 MCG (1000 UT) CAPS Take 1,000 Units by mouth daily.    [provider]  empagliflozin (JARDIANCE) 10 MG TABS tablet Take 1 tablet (10 mg total) by mouth daily before breakfast. 06/29/21   Bensimhon, Shaune Pascal, MD  escitalopram (LEXAPRO) 20 MG tablet Take 20 mg by mouth daily. Takes at bedtime.    [provider]  finasteride (PROSCAR) 5 MG tablet Take 5 mg by mouth daily at 12 noon.     [provider]  fluticasone (FLONASE) 50 MCG/ACT nasal spray Place 1 spray into both nostrils 2 (two) times daily. 10/24/17   [provider]  loratadine (CLARITIN) 10 MG tablet Take 10 mg by mouth daily.    [provider]  metoprolol tartrate (LOPRESSOR) 100 MG tablet Take 1 tablet (100 mg total) by mouth 2 (two) times daily. 06/10/21   Clegg, Amy D, NP  Multiple Vitamin (MULTIVITAMIN WITH MINERALS) TABS tablet Take 1 tablet by mouth daily at 12 noon.    [provider]  PHENobarbital (LUMINAL) 97.2 MG tablet Take 97.2 mg by mouth daily. Takes between 3:00-5:00pm.    [provider]  potassium chloride SA (KLOR-CON M) 20 MEQ tablet Take 1 tablet (20 mEq total) by mouth daily. 06/29/21   Bensimhon, Shaune Pascal, MD  pravastatin (PRAVACHOL) 40 MG tablet Take 40 mg by mouth at bedtime.     [provider]  spironolactone (ALDACTONE) 25 MG tablet Take 0.5 tablets (12.5 mg total) by mouth daily. 06/10/21   Clegg, Amy D, NP  torsemide  (DEMADEX) 20 MG tablet Take 2 tablets (40 mg total) by mouth 2 (two) times daily. 07/15/21   Milford, Maricela Bo, FNP  triamcinolone cream (KENALOG) 0.1 % Apply 1 application. topically 2 (two) times daily. 05/13/21   [provider]    Physical Exam:  Vitals:   07/27/21 0000 07/27/21 0015 07/27/21 0037 07/27/21 0528  BP: (!) 109/91 103/79 108/76 110/74  Pulse: 97 93 (!) 103 (!) 55  Resp: _0 Temp:   98.1 F (36.7 C) 97.6 F (36.4 C)  TempSrc:   Oral Oral  SpO2: 98% 99% 97% 99%  Weight:   105.3 kg   Height:        Constitutional: Awake alert and oriented x3, no associated distress.   Skin: no rashes, no lesions, good skin turgor noted. Eyes: Pupils are equally reactive to light.  No evidence of scleral icterus or conjunctival pallor.  ENMT: Moist mucous membranes noted.  Posterior pharynx clear of any exudate or lesions.   Neck: normal, supple, no masses, no thyromegaly.  Elevated jugular venous pulse at 45 degrees.   Respiratory: Notable bibasilar rales.  No evidence of wheezing.  Normal respiratory effort. No accessory muscle use.  Cardiovascular: Tachycardic rate and irregularly irregular rhythm, no murmurs / rubs / gallops.  Notable pitting edema of the bilateral lower extremities that tracks in the feet up to the knees.. 2+ pedal pulses. No carotid bruits.  Chest:   Nontender without crepitus or deformity.   Back:   Nontender without crepitus or deformity. Abdomen: Abdomen is soft and nontender.  No evidence of intra-abdominal masses.  Positive bowel sounds noted in all quadrants.   Musculoskeletal: Pitting edema as noted above of the lower extremities.  No joint deformity upper and lower extremities. Good ROM, no contractures. Normal muscle tone.  Neurologic: CN 2-12 grossly intact. Sensation intact.  Patient moving all 4 extremities spontaneously.  Patient is following all commands.  Patient is responsive to verbal stimuli.   Psychiatric: Patient exhibits normal  mood with appropriate affect.  Patient seems to possess insight as to their current situation.    Data Reviewed:  I have personally reviewed and interpreted labs, imaging.  Significant findings are   Creatinine 1.7 Troponin 7 Chest x-ray personally reviewed revealing cardiomegaly without obvious pulmonary edema  EKG: Personally reviewed.  Rhythm is atrial fibrillation with heart rate of 108 bpm.  No dynamic ST segment changes appreciated.    Assessment and Plan: * Acute pulmonary embolism [facility]) Patient presenting with several week history of generalized weakness found to have  bilateral pulmonary emboli on CT angiogram of the chest despite being on Coumadin therapy Patient reports ongoing compliance with this medication but does report that several weeks ago his INR was subtherapeutic requiring his outpatient provider to increase the dosing of his Coumadin. patient appears to have bilateral pulmonary emboli despite having an elevated INR.  Either patient has developed these pulmonary emboli while patient's INR was subtherapeutic or his Coumadin is ineffective (less likely).    Discussed with pharmacy, for now Coumadin has been discontinued and will keep patient on heparin infusion without bolus and patient will need to be monitored closely for any evidence of bleeding complications Will consult hematology on day shift to help determine etiology of patient's pulmonary emboli and ultimately what anticoagulant the patient should be switched to. will check INR regularly Supplemental oxygen for bouts of hypoxia Concerning possible right heart strain on CT imaging, this will be difficult to ascertain based on CT considering patient has a history of advanced cardiomyopathy and pulmonary hypertension.  Patient is not requiring any oxygen at this time and troponin is unremarkable.  It is unlikely that patient would require thrombectomy or thrombolysis. We will obtain echocardiogram in the  morning   Atrial fibrillation with rapid ventricular response (HCC) Patient initially presented with rapid atrial fibrillation with EMS reporting heart rates approaching 150 bpm Rapid atrial fibrillation is likely contributing to patient's shortness of breath combination with acute congestive heart failure and pulmonary emboli. Heart rate is much improved after 5 mg of intravenous metoprolol administered by the emergency department staff We will transition back to home regimen of oral metoprolol Monitoring patient on telemetry Anticoagulation as noted above  AKI (acute kidney injury) ([place]) Progressively worsening renal injury with creatinine on arrival of 1.73, up from 1 one month ago Etiology is not entirely clear, patient appears volume overloaded at this time. Obtaining urinalysis Bladder scan Placing patient on trial of 60 mg of intravenous Lasix twice daily and will need to monitor renal function closely.  I am hoping that patient's renal function will paradoxically improve with diuresis.  Acute on chronic systolic CHF (congestive heart failure) (HCC) On exam patient has bibasilar rales, significant pitting lower extremity peripheral edema and notably elevated jugular venous pulse at 45 degrees We will place patient on trial of intravenous Lasix despite having an elevated creatinine and monitor for response 60 mg intravenous Lasix twice daily Strict input and output monitoring Supplemental oxygen for bouts of hypoxia Monitoring renal function and electrolytes with serial chemistires Daily weights Monitoring patient on telemetry Echocardiogram ordered for the morning   Essential hypertension Resume patients home regimen of Metoprolol Titrate antihypertensive regimen as necessary to achieve adequate BP control PRN intravenous antihypertensives for excessively elevated blood pressure    COPD (chronic obstructive pulmonary disease) (HCC) No evidence of COPD exacerbation this  time As needed bronchodilator therapy for episodic shortness of breath and wheezing.   Mixed hyperlipidemia Continuing home regimen of lipid lowering therapy.   OSA on CPAP Continuing home regimen of CPAP nightly        Code Status:  Full code  code status decision has been confirmed with: patient -patient disagrees with MOST FORM located in the chart. Family Communication: deferred   Consults: Will consult hematology in the morning.  Will potentially consult cardiology and PCCM based on clinical course.  Severity of Illness:  The appropriate patient status for this patient is INPATIENT. Inpatient status is judged to be reasonable and necessary in order to provide the required  intensity of service to ensure the patient's safety. The patient's presenting symptoms, physical exam findings, and initial radiographic and laboratory data in the context of their chronic comorbidities is felt to place them at high risk for further clinical deterioration. Furthermore, it is not anticipated that the patient will be medically stable for discharge from the hospital within 2 midnights of admission.   * I certify that at the point of admission it is my clinical judgment that the patient will require inpatient hospital care spanning beyond 2 midnights from the point of admission due to high intensity of service, high risk for further deterioration and high frequency of surveillance required.*  Author:  Vernelle Emerald MD  07/27/2021 5:43 AM

## 2021-07-27 NOTE — Progress Notes (Signed)
ANTICOAGULATION CONSULT NOTE - Initial Consult  Pharmacy Consult for heparin Indication: pulmonary embolus  Allergies  Allergen Reactions   Dilaudid [Hydromorphone Hcl] Other (See Comments)    Makes pt hyper    Hydromorphone Other (See Comments)    hyperactiviity Other reaction(s): made him wild Other reaction(s): Unknown   Morphine Sulfate     Other reaction(s): at high dose causes confusion   Tegretol [Carbamazepine] Hives   Carbamazepine Hives and Rash    Other reaction(s): hives Other reaction(s): Hives    Patient Measurements: Height: _0  (172.7 cm) Weight: 104.3 kg (230 lb) IBW/kg (Calculated) : 68.4 Heparin Dosing Weight: 91kg  Vital Signs: Temp: 98.4 F (36.9 C) (05/23 1752) Temp Source: Oral (05/23 1752) BP: 109/91 (05/24 0000) Pulse Rate: 97 (05/24 0000)  Labs: Recent Labs    07/26/21 1208 07/26/21 1757 07/26/21 2025  HGB  --  12.8*  --   HCT  --  40.3  --   PLT  --  151  --   LABPROT  --   --  37.5*  INR  --   --  3.8*  CREATININE 1.63* 1.73*  --     Estimated Creatinine Clearance: 41.2 mL/min (A) (by C-G formula based on SCr of 1.73 mg/dL (H)).   Medical History: Past Medical History:  Diagnosis Date   Acquired thrombophilia ([place]) 01/15/2019   Aortic atherosclerosis ([place])    Aortic root dilatation (HCC)    CAP (community acquired pneumonia) 03/06/2016   Chronic laryngitis 67/70/3403   Chronic systolic CHF (congestive heart failure) ([place])    COPD GOLD II  08/02/2016   Quit smoking 2000  PFT's  07/10/2016  FEV1 1.98 (70 % ) ratio 67  p 19 % improvement from saba p nothing  prior to study while of coreg so rec as of 08/02/2016 try off coreg and on bisoprolol      Essential hypertension 03/06/2016   Changed from coreg to bisoprolol due to copd with reversible component  08/02/2016 >>>    Hoarseness 08/31/2016   Referred to ent 08/31/2016 >>> seen 10/03/16 [name] dx gerd and rhinitis medicamentosa   > improved on f/u 01/31/17 on gerd rx/ flonase and  off afrin   Hyperlipidemia    Hypertension    Laryngopharyngeal reflux (LPR) 10/03/2016   Mitral regurgitation    Moderate persistent asthma 08/02/2016   Morbid (severe) obesity due to excess calories ([place]) 08/02/2016   Nasal polyps 10/03/2016   NICM (nonischemic cardiomyopathy) ([place])    a. EF 40-45% in 04/2016, etiology not defined, managed medically.   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   Rhinitis medicamentosa 10/03/2016   Right thyroid nodule 03/06/2016   Seizures ([place])    "take RX daily" (03/06/2016)   Sensorineural hearing loss (SNHL) of both ears 01/31/2017   Tricuspid regurgitation    Assessment: 76 YOM presenting with afib RVR, hx of afib on warfarin PTA (INR 3.8 on admission), CT angio with PE segmental + subsegmental bilaterally with right ventricle distention.  Discussed with MD and will proceed with heparin gtt without bolus considering new PE  Goal of Therapy:  Heparin level 0.3-0.7 units/ml Monitor platelets by anticoagulation protocol: Yes   Plan:  Heparin gtt at 1400 units/hr, no bolus F/u 8 hour heparin level F/u INR with AM labs, CBC and s/s bleeding  Bertis Ruddy, PharmD Clinical Pharmacist ED Pharmacist  Phone # [phone] 07/27/2021 12:06 AM

## 2021-07-27 NOTE — Assessment & Plan Note (Addendum)
   Patient presenting with several week history of generalized weakness found to have bilateral pulmonary emboli on CT angiogram of the chest despite being on Coumadin therapy  Patient reports ongoing compliance with this medication but does report that several weeks ago his INR was subtherapeutic requiring his outpatient provider to increase the dosing of his Coumadin.  patient appears to have bilateral pulmonary emboli despite having an elevated INR.  Either patient has developed these pulmonary emboli while patient's INR was subtherapeutic or his Coumadin is ineffective (less likely).     Discussed with pharmacy, for now Coumadin has been discontinued and will keep patient on heparin infusion without bolus and patient will need to be monitored closely for any evidence of bleeding complications  Will consult hematology on day shift to help determine etiology of patient's pulmonary emboli and ultimately what anticoagulant the patient should be switched to.  will check INR regularly  Supplemental oxygen for bouts of hypoxia  Concerning possible right heart strain on CT imaging, this will be difficult to ascertain based on CT considering patient has a history of advanced cardiomyopathy and pulmonary hypertension.  Patient is not requiring any oxygen at this time and troponin is unremarkable.  It is unlikely that patient would require thrombectomy or thrombolysis.  We will obtain echocardiogram in the morning

## 2021-07-27 NOTE — Progress Notes (Signed)
Lower extremity venous bilateral study completed.   Please see CV Proc for preliminary results.   Faizan Geraci, RDMS, RVT  

## 2021-07-27 NOTE — Assessment & Plan Note (Signed)
.   Resume patients home regimen of Metoprolol . Titrate antihypertensive regimen as necessary to achieve adequate BP control . PRN intravenous antihypertensives for excessively elevated blood pressure

## 2021-07-27 NOTE — Consult Note (Signed)
Hematology/Oncology Inpatient Consult Note  Clinical Summary: Mr. Da Authement is a 76 year old male who presents for evaluation of generalized weakness and fatigue.  He has atrial fibrillation on therapeutic dose Coumadin and was found to have bilateral pulmonary emboli.  HPI: Zyeir Dymek is a 76 year old male who presents for evaluation of generalized weakness and fatigue and was found to have bilateral pulmonary emboli while on therapeutic dose Coumadin for atrial fibrillation.  Mr. Darco presented yesterday to the emergency department and was in atrial fibrillation with RVR with rates in the 140s.  His labs showed white blood cell count 9.1, hemoglobin 12.8, and platelets of 151.  CT angio of the chest showed small segmental and subsegmental pulmonary emboli bilaterally with distended right ventricle.  Bilateral lower extremity ultrasounds were negative for DVT.  Echocardiogram was performed which showed a left ventricular ejection fraction of 20% with global hypokinesis.  Due to concern for this patient developing bilateral pulmonary emboli while on therapeutic dose Coumadin hematology was consulted for further evaluation and management.  On exam today Mr. Mccurdy reports that he is doing well overall.  He notes that he became progressively short of breath on exertion without any frank chest pain.  He reports that he is faithful with his Coumadin and is typically within therapeutic range.  He does not have any issues with bleeding, bruising, or dark stools while on this medication.  He notes that he has been on his seizure medication and has been successful for him for over 20 years and he would not like to change this if at all possible.  He notes that he is unsure if he would like to proceed with Lovenox therapy but would be willing to give it a try.  He otherwise denies any fevers, chills, sweats, nausea or vomiting or diarrhea.  A full 10 point ROS is listed below  O:  Vitals:   07/28/21 0825  07/28/21 1508  BP: 109/73 111/84  Pulse: 93   Resp:    Temp:  98.1 F (36.7 C)  SpO2:        Latest Ref Rng & Units 07/28/2021    3:15 AM 07/27/2021   12:31 PM 07/27/2021    2:05 AM  CMP  Glucose 70 - 99 mg/dL 100   141   96    BUN 8 - 23 mg/dL _0 Creatinine 0.61 - 1.24 mg/dL 1.52   1.48   1.70    Sodium 135 - 145 mmol/L 138   141   139    Potassium 3.5 - 5.1 mmol/L 3.9   3.2   4.2    Chloride 98 - 111 mmol/L 99   101   101    CO2 22 - 32 mmol/L 31   32   30    Calcium 8.9 - 10.3 mg/dL 8.5   8.5   8.7    Total Protein 6.5 - 8.1 g/dL   6.5    Total Bilirubin 0.3 - 1.2 mg/dL   0.5    Alkaline Phos 38 - 126 U/L   122    AST 15 - 41 U/L   23    ALT 0 - 44 U/L   17        Latest Ref Rng & Units 07/28/2021    3:15 AM 07/27/2021   12:31 PM 07/27/2021    2:05 AM  CBC  WBC 4.0 - 10.5 K/uL 8.8   7.2  8.5    Hemoglobin 13.0 - 17.0 g/dL 12.5   12.2   12.4    Hematocrit 39.0 - 52.0 % 39.1   37.2   38.6    Platelets 150 - 400 K/uL 144   129   134        GENERAL: Chronically ill-appearing elderly Caucasian male in NAD  SKIN: skin color, texture, turgor are normal, no rashes or significant lesions EYES: conjunctiva are pink and non-injected, sclera clear LUNGS: clear to auscultation and percussion with normal breathing effort HEART: regular rate & rhythm and no murmurs and no lower extremity edema Musculoskeletal: no cyanosis of digits and no clubbing  PSYCH: alert & oriented x 3, fluent speech NEURO: no focal motor/sensory deficits  Assessment/Plan: Mr. Raymund Manrique is a 76 year old male who presents for evaluation of generalized weakness and fatigue.  He has atrial fibrillation on therapeutic dose Coumadin and was found to have bilateral pulmonary emboli.  #Bilateral Pulmonary Emboli on Therapeutic Dose Coumadin -- Markedly uncommon to see Coumadin failure.  Would recommend transitioning to Lovenox 1 mg/kg ( as long as CrCl >30 there is no need for dose adjustment) as  an outpatient regimen --Continue IV heparin at therapeutic dosages.  Would recommend transition to Lovenox prior to discharge -- In the setting of his seizure medications DOAC therapy is not recommended.  Patient does not wish to change his seizure regimen as it has been successful. --We will order hypercoagulation studies to include antiphospholipid antibodies.  Testing of Antithrombin III, protein C, and protein S would be less helpful in the setting of an acute clot.  We will hold on testing these for now --Hematology will continue to follow.    Ledell Peoples, MD Department of Hematology/Oncology [place] [facility] Phone: [phone] Pager: [phone] Email: [email or web address]

## 2021-07-27 NOTE — Progress Notes (Signed)
Mobility Specialist Progress Note    07/27/21 1723  Mobility  Activity Ambulated with assistance in hallway  Level of Assistance Contact guard assist, steadying assist  Assistive Device Front wheel walker  Distance Ambulated (ft) 120 ft  Activity Response Tolerated well  $Mobility charge 1 Mobility   Pre-Mobility: 97 HR, 103/67 BP, 91% SpO2 Post-Mobility: 131 HR  Pt received in bed and agreeable. No complaints. HR tachy during activity. Returned to chair with call bell in reach. RN notified.  Hildred Alamin Mobility Specialist  Primary: 5N M.S. Phone: [phone] Secondary: 6N M.S. Phone: [phone]

## 2021-07-27 NOTE — Assessment & Plan Note (Signed)
.   Continuing home regimen of lipid lowering therapy.

## 2021-07-27 NOTE — Assessment & Plan Note (Signed)
   On exam patient has bibasilar rales, significant pitting lower extremity peripheral edema and notably elevated jugular venous pulse at 45 degrees  We will place patient on trial of intravenous Lasix despite having an elevated creatinine and monitor for response  60 mg intravenous Lasix twice daily Strict input and output monitoring Supplemental oxygen for bouts of hypoxia Monitoring renal function and electrolytes with serial chemistires Daily weights Monitoring patient on telemetry Echocardiogram ordered for the morning

## 2021-07-27 NOTE — Telephone Encounter (Signed)
Patient admitted to hospital last PM.  I spoke with [name] HF Community Paramedic and she will assure that patient's medication box is updated upon discharge from the hospital

## 2021-07-27 NOTE — Assessment & Plan Note (Signed)
No evidence of COPD exacerbation this time  As needed bronchodilator therapy for episodic shortness of breath and wheezing.

## 2021-07-27 NOTE — Assessment & Plan Note (Addendum)
   Progressively worsening renal injury with creatinine on arrival of 1.73, up from 1 one month ago  Etiology is not entirely clear, patient appears volume overloaded at this time.  Obtaining urinalysis  Bladder scan  Placing patient on trial of 60 mg of intravenous Lasix twice daily and will need to monitor renal function closely.  I am hoping that patient's renal function will paradoxically improve with diuresis.

## 2021-07-27 NOTE — Assessment & Plan Note (Addendum)
   Patient initially presented with rapid atrial fibrillation with EMS reporting heart rates approaching 150 bpm  Rapid atrial fibrillation is likely contributing to patient's shortness of breath combination with acute congestive heart failure and pulmonary emboli.  Heart rate is much improved after 5 mg of intravenous metoprolol administered by the emergency department staff  We will transition back to home regimen of oral metoprolol  Monitoring patient on telemetry  Anticoagulation as noted above

## 2021-07-28 DIAGNOSIS — I2699 Other pulmonary embolism without acute cor pulmonale: Secondary | ICD-10-CM | POA: Diagnosis not present

## 2021-07-28 LAB — CBC
HCT: 39.1 % (ref 39.0–52.0)
Hemoglobin: 12.5 g/dL — ABNORMAL LOW (ref 13.0–17.0)
MCH: 29.5 pg (ref 26.0–34.0)
MCHC: 32 g/dL (ref 30.0–36.0)
MCV: 92.2 fL (ref 80.0–100.0)
Platelets: 144 10*3/uL — ABNORMAL LOW (ref 150–400)
RBC: 4.24 MIL/uL (ref 4.22–5.81)
RDW: 17 % — ABNORMAL HIGH (ref 11.5–15.5)
WBC: 8.8 10*3/uL (ref 4.0–10.5)
nRBC: 0 % (ref 0.0–0.2)

## 2021-07-28 LAB — PHOSPHORUS: Phosphorus: 3.8 mg/dL (ref 2.5–4.6)

## 2021-07-28 LAB — BASIC METABOLIC PANEL
Anion gap: 8 (ref 5–15)
BUN: 26 mg/dL — ABNORMAL HIGH (ref 8–23)
CO2: 31 mmol/L (ref 22–32)
Calcium: 8.5 mg/dL — ABNORMAL LOW (ref 8.9–10.3)
Chloride: 99 mmol/L (ref 98–111)
Creatinine, Ser: 1.52 mg/dL — ABNORMAL HIGH (ref 0.61–1.24)
GFR, Estimated: 47 mL/min — ABNORMAL LOW (ref >60)
Glucose, Bld: 100 mg/dL — ABNORMAL HIGH (ref 70–99)
Potassium: 3.9 mmol/L (ref 3.5–5.1)
Sodium: 138 mmol/L (ref 135–145)

## 2021-07-28 LAB — PROTIME-INR
INR: 2.8 — ABNORMAL HIGH (ref 0.8–1.2)
Prothrombin Time: 29.1 seconds — ABNORMAL HIGH (ref 11.4–15.2)

## 2021-07-28 LAB — MAGNESIUM: Magnesium: 2.4 mg/dL (ref 1.7–2.4)

## 2021-07-28 LAB — HEPARIN LEVEL (UNFRACTIONATED): Heparin Unfractionated: 0.71 IU/mL — ABNORMAL HIGH (ref 0.30–0.70)

## 2021-07-28 MED ORDER — WARFARIN SODIUM 5 MG PO TABS
5.0000 mg | ORAL_TABLET | Freq: Once | ORAL | Status: AC
  Administered 2021-07-28: 5 mg via ORAL
  Filled 2021-07-28: qty 1

## 2021-07-28 MED ORDER — WARFARIN - PHARMACIST DOSING INPATIENT: Freq: Every day | Status: DC

## 2021-07-28 NOTE — Progress Notes (Addendum)
ANTICOAGULATION CONSULT NOTE -  Pharmacy Consult for heparin Indication: pulmonary embolus  Allergies  Allergen Reactions   Dilaudid [Hydromorphone Hcl] Other (See Comments)    Makes pt hyper    Hydromorphone Other (See Comments)    hyperactiviity Other reaction(s): made him wild Other reaction(s): Unknown   Morphine Sulfate     Other reaction(s): at high dose causes confusion   Tegretol [Carbamazepine] Hives   Carbamazepine Hives and Rash    Other reaction(s): hives Other reaction(s): Hives    Patient Measurements: Height: _0  (172.7 cm) Weight: 105.3 kg (232 lb 1.6 oz) IBW/kg (Calculated) : 68.4 Heparin Dosing Weight: 91kg  Vital Signs: Temp: 97.6 F (36.4 C) (05/25 0106) Temp Source: Oral (05/25 0106) BP: 109/73 (05/25 0825) Pulse Rate: 93 (05/25 0825)  Labs: Recent Labs    07/26/21 2025 07/26/21 2254 07/27/21 0205 07/27/21 1231 07/27/21 2257 07/28/21 0315  HGB  --   --  12.4* 12.2*  --  12.5*  HCT  --   --  38.6* 37.2*  --  39.1  PLT  --   --  134* 129*  --  144*  LABPROT 37.5*  --  36.6* 29.2*  --   --   INR 3.8*  --  3.7* 2.8*  --   --   HEPARINUNFRC  --   --   --  0.17* 0.59 0.71*  CREATININE  --   --  1.70* 1.48*  --  1.52*  TROPONINIHS  --  7 7  --   --   --      Estimated Creatinine Clearance: 47.1 mL/min (A) (by C-G formula based on SCr of 1.52 mg/dL (H)).   Medical History: Past Medical History:  Diagnosis Date   Acquired thrombophilia ([place]) 01/15/2019   Aortic atherosclerosis ([place])    Aortic root dilatation ([facility])    CAP (community acquired pneumonia) 03/06/2016   Chronic laryngitis 34/35/6861   Chronic systolic CHF (congestive heart failure) ([place])    COPD GOLD II  08/02/2016   Quit smoking 2000  PFT's  07/10/2016  FEV1 1.98 (70 % ) ratio 67  p 19 % improvement from saba p nothing  prior to study while of coreg so rec as of 08/02/2016 try off coreg and on bisoprolol      Essential hypertension 03/06/2016   Changed from coreg to bisoprolol  due to copd with reversible component  08/02/2016 >>>    Hoarseness 08/31/2016   Referred to ent 08/31/2016 >>> seen 10/03/16 [name] dx gerd and rhinitis medicamentosa   > improved on f/u 01/31/17 on gerd rx/ flonase and off afrin   Hyperlipidemia    Hypertension    Laryngopharyngeal reflux (LPR) 10/03/2016   Mitral regurgitation    Moderate persistent asthma 08/02/2016   Morbid (severe) obesity due to excess calories ([place]) 08/02/2016   Nasal polyps 10/03/2016   NICM (nonischemic cardiomyopathy) ([place])    a. EF 40-45% in 04/2016, etiology not defined, managed medically.   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   Rhinitis medicamentosa 10/03/2016   Right thyroid nodule 03/06/2016   Seizures ([place])    "take RX daily" (03/06/2016)   Sensorineural hearing loss (SNHL) of both ears 01/31/2017   Tricuspid regurgitation    Assessment: 76 YOM presenting with afib RVR, hx of afib on warfarin PTA (INR 3.8 on admission), CT angio with PE segmental +  subsegmental bilaterally with right ventricle distention.  Discussed with MD and will proceed with heparin gtt without bolus considering new PE -heparin level= 0.71, hg= 12.5, plt= 144  He is on phenobarbital for history of seizures. This medication can reduce efficacy of DOACs and most sources recommend avoiding DOACs in patients on phenobarbitital -cost of enoxaparin: $99 per month  Warfarin to restart today -INR= 2.8  Home regimen: 340m/day except take 2.58mon Mon  Goal of Therapy:  INR goal: 2-3 Heparin level 0.3-0.7 units/ml Monitor platelets by anticoagulation protocol: Yes   Plan:  -discontinue heparin  -Warfarin 40m37mo today -Daily PT/INR  AndHildred LaserharmD Clinical Pharmacist **Pharmacist phone directory can now be found on [email or web address] ([email or web address]).  Listed under [facility]

## 2021-07-28 NOTE — Progress Notes (Addendum)
Progress Note  Patient Name: Ruben Murray Date of Encounter: 07/28/2021  [facility] Cardiologist: [name], MD   Subjective   Patient denies having chest pain, palpitations, dizziness. Continues to have SOB on exertion. Denies orthopnea or PND.   Inpatient Medications    Scheduled Meds:  furosemide  60 mg Intravenous BID   loratadine  10 mg Oral Daily   metoprolol tartrate  100 mg Oral BID   multivitamin with minerals  1 tablet Oral Q1200   phenobarbital  97.2 mg Oral QAC supper   pravastatin  40 mg Oral QHS   Continuous Infusions:  heparin 1,650 Units/hr (07/27/21 2000)   PRN Meds: acetaminophen **OR** acetaminophen, albuterol, fluticasone, hydrALAZINE, polyethylene glycol   Vital Signs    Vitals:   07/27/21 1924 07/27/21 1926 07/27/21 2133 07/28/21 0106  BP: 110/74 110/74  107/76  Pulse: (!) 116 97 (!) 107 (!) 103  Resp: _0 Temp: 98.1 F (36.7 C) 98.1 F (36.7 C)  97.6 F (36.4 C)  TempSrc: Oral   Oral  SpO2: 99%   95%  Weight:      Height:        Intake/Output Summary (Last 24 hours) at 07/28/2021 0720 Last data filed at 07/28/2021 0110 Gross per 24 hour  Intake 120 ml  Output 2600 ml  Net -2480 ml      07/27/2021   12:37 AM 07/26/2021    5:52 PM 07/26/2021    5:05 PM  Last 3 Weights  Weight (lbs) 232 lb 1.6 oz 230 lb 229 lb  Weight (kg) 105.28 kg 104.327 kg 103.874 kg      Telemetry    Atrial Fibrillation, HR in the 90s-100s - Personally Reviewed  ECG    No new tracings since 5/23 - Personally Reviewed  Physical Exam   GEN: No acute distress. Sitting upright in the bed  Neck: No JVD Cardiac: Irregular rate and rhythm, no murmur. Radial pulses 2+ bilaterally  Respiratory: Crackles in bilateral lung bases  GI: Soft, nontender, non-distended  MS: Trace edema; No deformity. Neuro:  Nonfocal  Psych: Normal affect   Labs    High Sensitivity Troponin:   Recent Labs  Lab 07/26/21 2254 07/27/21 0205  TROPONINIHS 7 7      Chemistry Recent Labs  Lab 07/26/21 1757 07/27/21 0205 07/27/21 1231 07/28/21 0315  NA 137 139 141 138  K 3.9 4.2 3.2* 3.9  CL 99 101 101 99  CO2 28 30 32 31  GLUCOSE 123* 96 141* 100*  BUN 36* 30* 28* 26*  CREATININE 1.73* 1.70* 1.48* 1.52*  CALCIUM 8.6* 8.7* 8.5* 8.5*  MG 2.5* 2.4  --  2.4  PROT  --  6.5  --   --   ALBUMIN  --  3.4*  --   --   AST  --  23  --   --   ALT  --  17  --   --   ALKPHOS  --  122  --   --   BILITOT  --  0.5  --   --   GFRNONAA 40* 41* 48* 47*  ANIONGAP _1 Lipids No results for input(s): CHOL, TRIG, HDL, LABVLDL, LDLCALC, CHOLHDL in the last 168 hours.  Hematology Recent Labs  Lab 07/27/21 0205 07/27/21 1231 07/28/21 0315  WBC 8.5 7.2 8.8  RBC 4.17* 4.04* 4.24  HGB 12.4* 12.2* 12.5*  HCT 38.6* 37.2* 39.1  MCV  92.6 92.1 92.2  MCH 29.7 30.2 29.5  MCHC 32.1 32.8 32.0  RDW 16.9* 17.0* 17.0*  PLT 134* 129* 144*   Thyroid No results for input(s): TSH, FREET4 in the last 168 hours.  BNP Recent Labs  Lab 07/26/21 1757  BNP 753.5*    DDimer No results for input(s): DDIMER in the last 168 hours.   Radiology    CT Angio Chest PE W and/or Wo Contrast  Result Date: 07/26/2021 CLINICAL DATA:  Pulmonary embolism suspected, high probability. Weakness, tachycardia. EXAM: CT ANGIOGRAPHY CHEST WITH CONTRAST TECHNIQUE: Multidetector CT imaging of the chest was performed using the standard protocol during bolus administration of intravenous contrast. Multiplanar CT image reconstructions and MIPs were obtained to evaluate the vascular anatomy. RADIATION DOSE REDUCTION: This exam was performed according to the departmental dose-optimization program which includes automated exposure control, adjustment of the mA and/or kV according to patient size and/or use of iterative reconstruction technique. CONTRAST:  28m OMNIPAQUE IOHEXOL 350 MG/ML SOLN COMPARISON:  04/25/2016. FINDINGS: Cardiovascular: The heart is enlarged and there is no pericardial  effusion. Scattered coronary artery calcifications are noted. There is atherosclerotic calcification of the aorta without evidence of aneurysm. The pulmonary trunk is normal in caliber. A few small segmental and subsegmental pulmonary artery filling defects are noted in the left lower lobe, right lower lobe, and right upper lobe. The right ventricle is distended suggesting underlying right heart strain. Mediastinum/Nodes: No mediastinal, hilar, or axillary lymphadenopathy. The thyroid gland, trachea, and esophagus are within normal limits. Lungs/Pleura: Atelectasis is noted bilaterally. No effusion or pneumothorax. Upper Abdomen: There is reflux of contrast into the inferior vena cava and hepatic veins, which may be associated with right heart failure. No acute abnormality. Musculoskeletal: Mild degenerative changes are present in the thoracic spine. No acute osseous abnormality. Review of the MIP images confirms the above findings. IMPRESSION: 1. Small segmental and subsegmental pulmonary emboli bilaterally. The right ventricle is distended which may be associated with underlying right heart strain. 2. Cardiomegaly with coronary artery calcifications. 3. Reflux of contrast into the inferior vena cava and hepatic veins, may be associated with right heart failure. 4. Aortic atherosclerosis. Critical findings reported to Dr. JRegan Lemmingat 9:55 p.m. Electronically Signed   By: LBrett FairyM.D.   On: 07/26/2021 21:57   DG Chest Portable 1 View  Result Date: 07/26/2021 CLINICAL DATA:  Near syncope EXAM: PORTABLE CHEST 1 VIEW COMPARISON:  05/17/2021, 04/19/2021, 10/29/2017 FINDINGS: Globular cardiac enlargement. No consolidation, pleural effusion, or pneumothorax. IMPRESSION: 1. Cardiomegaly with globular cardiac configuration as before which could be due to multi chamber enlargement or potential pericardial fluid. 2. No acute airspace disease Electronically Signed   By: KDonavan FoilM.D.   On: 07/26/2021 18:47    ECHOCARDIOGRAM COMPLETE  Result Date: 07/27/2021    ECHOCARDIOGRAM REPORT   Patient Name:   Ruben COSSEYDate of Exam: 07/27/2021 Medical Rec #:  [id number]      Height:       68.0 in Accession #:    [id number]     Weight:       232.1 lb Date of Birth:  3March 27, 1947      BSA:          2.177 m Patient Age:    89years        BP:           122/78 mmHg Patient Gender: M  HR:           76 bpm. Exam Location:  Inpatient Procedure: 2D Echo, Cardiac Doppler and Color Doppler Indications:    I50.23 Acute on chronic systolic (congestive) heart failure  History:        Patient has prior history of Echocardiogram examinations, most                 recent 04/20/2021. Cardiomyopathy, Arrythmias:Atrial Flutter and                 Atrial Fibrillation, Signs/Symptoms:Shortness of Breath and                 Fatigue; Risk Factors:Sleep Apnea, Hypertension, Dyslipidemia                 and Former Smoker.  Sonographer:    Wilkie Aye RVT Referring Phys: [id number] [place]  Sonographer Comments: Suboptimal apical window. Patients inability to reposition. IMPRESSIONS  1. Left ventricular ejection fraction, by estimation, is 20%. The left ventricle has severely decreased function. The left ventricle demonstrates global hypokinesis. The left ventricular internal cavity size was mildly dilated. Left ventricular diastolic parameters are indeterminate.  2. Right ventricular systolic function is moderately reduced. The right ventricular size is severely enlarged. There is normal pulmonary artery systolic pressure. The estimated right ventricular systolic pressure is 09.8 mmHg.  3. Left atrial size was mildly dilated.  4. Right atrial size was moderately dilated.  5. The mitral valve is normal in structure. Mild mitral valve regurgitation. No evidence of mitral stenosis.  6. Tricuspid valve regurgitation is moderate to severe.  7. The aortic valve is tricuspid. There is mild calcification of the aortic valve. Aortic  valve regurgitation is mild. Aortic valve sclerosis is present, with no evidence of aortic valve stenosis.  8. Aortic dilatation noted. There is mild dilatation of the aortic root, measuring 37 mm.  9. The inferior vena cava is dilated in size with <50% respiratory variability, suggesting right atrial pressure of 15 mmHg. 10. The patient was in atrial fibrillation. Comparison(s): Left ventricular ejection fraction, by estimation, is 20 to 25%. FINDINGS  Left Ventricle: Left ventricular ejection fraction, by estimation, is 20%. The left ventricle has severely decreased function. The left ventricle demonstrates global hypokinesis. The left ventricular internal cavity size was mildly dilated. There is no left ventricular hypertrophy. Left ventricular diastolic parameters are indeterminate. Right Ventricle: The right ventricular size is severely enlarged. No increase in right ventricular wall thickness. Right ventricular systolic function is moderately reduced. There is normal pulmonary artery systolic pressure. The tricuspid regurgitant velocity is 2.23 m/s, and with an assumed right atrial pressure of 15 mmHg, the estimated right ventricular systolic pressure is 11.9 mmHg. Left Atrium: Left atrial size was mildly dilated. Right Atrium: Right atrial size was moderately dilated. Pericardium: Trivial pericardial effusion is present. Mitral Valve: The mitral valve is normal in structure. Mild mitral valve regurgitation. No evidence of mitral valve stenosis. Tricuspid Valve: The tricuspid valve is normal in structure. Tricuspid valve regurgitation is moderate to severe. Aortic Valve: The aortic valve is tricuspid. There is mild calcification of the aortic valve. Aortic valve regurgitation is mild. Aortic valve sclerosis is present, with no evidence of aortic valve stenosis. Aortic valve mean gradient measures 1.0 mmHg. Aortic valve peak gradient measures 1.8 mmHg. Aortic valve area, by VTI measures 2.50 cm. Pulmonic  Valve: The pulmonic valve was normal in structure. Pulmonic valve regurgitation is trivial. Aorta: Aortic dilatation noted. There is mild  dilatation of the aortic root, measuring 37 mm. Venous: The inferior vena cava is dilated in size with less than 50% respiratory variability, suggesting right atrial pressure of 15 mmHg. IAS/Shunts: No atrial level shunt detected by color flow Doppler.  LEFT VENTRICLE PLAX 2D LVIDd:         6.20 cm LVIDs:         5.30 cm LV PW:         1.30 cm LV IVS:        1.10 cm LVOT diam:     2.20 cm      3D Volume EF: LV SV:         28           3D EF:        24 % LV SV Index:   13           LV EDV:       274 ml LVOT Area:     3.80 cm     LV ESV:       207 ml                             LV SV:        66 ml  LV Volumes (MOD) LV vol d, MOD A4C: 191.0 ml LV vol s, MOD A4C: 117.0 ml LV SV MOD A4C:     191.0 ml RIGHT VENTRICLE            IVC RV Mid diam:    5.00 cm    IVC diam: 3.60 cm RV S prime:     8.05 cm/s TAPSE (M-mode): 2.0 cm LEFT ATRIUM           Index        RIGHT ATRIUM           Index LA diam:      4.90 cm 2.25 cm/m   RA Area:     36.60 cm LA Vol (A4C): 47.8 ml 21.95 ml/m  RA Volume:   160.00 ml 73.49 ml/m  AORTIC VALVE                    PULMONIC VALVE AV Area (Vmax):    2.66 cm     PV Vmax:       0.54 m/s AV Area (Vmean):   2.13 cm     PV Peak grad:  1.1 mmHg AV Area (VTI):     2.50 cm AV Vmax:           66.20 cm/s AV Vmean:          54.200 cm/s AV VTI:            0.111 m AV Peak Grad:      1.8 mmHg AV Mean Grad:      1.0 mmHg LVOT Vmax:         46.27 cm/s LVOT Vmean:        30.300 cm/s LVOT VTI:          0.073 m LVOT/AV VTI ratio: 0.66 AR Vena Contracta: 0.50 cm  AORTA Ao Root diam: 3.70 cm Ao Asc diam:  3.20 cm TRICUSPID VALVE TR Peak grad:   19.9 mmHg TR Vmax:        223.00 cm/s  SHUNTS Systemic VTI:  0.07 m Systemic Diam: 2.20 cm Dalton McleanMD Electronically signed by Franki Monte Signature Date/Time: 07/27/2021/3:38:42 PM  Final    VAS [place] LOWER EXTREMITY VENOUS  (DVT)  Result Date: 07/27/2021  Lower Venous DVT Study Patient Name:  KAGE WILLMANN  Date of Exam:   07/27/2021 Medical Rec #: [id number]        Accession #:    [id number] Date of Birth: 1945-05-12        Patient Gender: M Patient Age:   38 years Exam Location:  [facility] Procedure:      VAS [place] LOWER EXTREMITY VENOUS (DVT) Referring Phys: Inda Merlin --------------------------------------------------------------------------------  Indications: Swelling, SOB.  Comparison       12-10-2019 Prior left lower extremity venous was negative for Study:           DVT. Performing Technologist: Darlin Coco RDMS, RVT  Examination Guidelines: A complete evaluation includes B-mode imaging, spectral Doppler, color Doppler, and power Doppler as needed of all accessible portions of each vessel. Bilateral testing is considered an integral part of a complete examination. Limited examinations for reoccurring indications may be performed as noted. The reflux portion of the exam is performed with the patient in reverse Trendelenburg.  +---------+---------------+---------+-----------+----------+--------------+ RIGHT    CompressibilityPhasicitySpontaneityPropertiesThrombus Aging +---------+---------------+---------+-----------+----------+--------------+ CFV      Full           Yes      Yes                                 +---------+---------------+---------+-----------+----------+--------------+ SFJ      Full                                                        +---------+---------------+---------+-----------+----------+--------------+ FV Prox  Full                                                        +---------+---------------+---------+-----------+----------+--------------+ FV Mid   Full                                                        +---------+---------------+---------+-----------+----------+--------------+ FV DistalFull                                                         +---------+---------------+---------+-----------+----------+--------------+ PFV      Full                                                        +---------+---------------+---------+-----------+----------+--------------+ POP      Full           Yes      Yes                                 +---------+---------------+---------+-----------+----------+--------------+  PTV      Full                                                        +---------+---------------+---------+-----------+----------+--------------+ PERO     Full                                                        +---------+---------------+---------+-----------+----------+--------------+ Gastroc  Full                                                        +---------+---------------+---------+-----------+----------+--------------+   +---------+---------------+---------+-----------+----------+--------------+ LEFT     CompressibilityPhasicitySpontaneityPropertiesThrombus Aging +---------+---------------+---------+-----------+----------+--------------+ CFV      Full           Yes      Yes                                 +---------+---------------+---------+-----------+----------+--------------+ SFJ      Full                                                        +---------+---------------+---------+-----------+----------+--------------+ FV Prox  Full                                                        +---------+---------------+---------+-----------+----------+--------------+ FV Mid   Full                                                        +---------+---------------+---------+-----------+----------+--------------+ FV DistalFull                                                        +---------+---------------+---------+-----------+----------+--------------+ PFV      Full                                                         +---------+---------------+---------+-----------+----------+--------------+ POP      Full           Yes      Yes                                 +---------+---------------+---------+-----------+----------+--------------+  PTV      Full                                                        +---------+---------------+---------+-----------+----------+--------------+ PERO     Full                                                        +---------+---------------+---------+-----------+----------+--------------+ Gastroc  Full                                                        +---------+---------------+---------+-----------+----------+--------------+     Summary: RIGHT: - There is no evidence of deep vein thrombosis in the lower extremity.  - No cystic structure found in the popliteal fossa.  LEFT: - There is no evidence of deep vein thrombosis in the lower extremity.  - No cystic structure found in the popliteal fossa.  *See table(s) above for measurements and observations. Electronically signed by Harold Barban MD on 07/27/2021 at 8:54:31 PM.    Final     Cardiac Studies   Echocardiogram 07/27/21  1. Left ventricular ejection fraction, by estimation, is 20%. The left  ventricle has severely decreased function. The left ventricle demonstrates  global hypokinesis. The left ventricular internal cavity size was mildly  dilated. Left ventricular  diastolic parameters are indeterminate.   2. Right ventricular systolic function is moderately reduced. The right  ventricular size is severely enlarged. There is normal pulmonary artery  systolic pressure. The estimated right ventricular systolic pressure is  62.8 mmHg.   3. Left atrial size was mildly dilated.   4. Right atrial size was moderately dilated.   5. The mitral valve is normal in structure. Mild mitral valve  regurgitation. No evidence of mitral stenosis.   6. Tricuspid valve regurgitation is moderate to severe.   7. The  aortic valve is tricuspid. There is mild calcification of the  aortic valve. Aortic valve regurgitation is mild. Aortic valve sclerosis  is present, with no evidence of aortic valve stenosis.   8. Aortic dilatation noted. There is mild dilatation of the aortic root,  measuring 37 mm.   9. The inferior vena cava is dilated in size with <50% respiratory  variability, suggesting right atrial pressure of 15 mmHg.  10. The patient was in atrial fibrillation.   Comparison(s): Left ventricular ejection fraction, by estimation, is 20 to  25%.   Patient Profile     76 y.o. male with a hx of permanent Afib, HFrEF/NICM, HTN, OSA, seizure, COPD, OSA and  who was seen 07/27/2021 for the evaluation of Afib at the request of Dr. Dwyane Dee  Assessment & Plan    Permanent Afib - Patient is typically on coumadin (not on DOAC due to interactions with Phenobarbital)  - Supra-therapeutic INR of 3.8 on admission, now on IV heparin  - Hx of multiple failed cardioversions. Planned for outpatient CRT-D +/- AV node ablation but rescheduled as patient had bed bugs.  - Now with elevated rates on  the setting of CHF and acute PE on admission. Telemetry this morning shows HR predominantly in the 90s-100s. As patient is asymptomatic, his HR is at goal  - I expect HR to improve as patient/s PE and CHF exacerbation resolve. I also suspect patient has some degree of deconditioning because his activity levels have decreased recently due to ankle pain   - Continue on metoprolol 130m BID, BP is soft so there is no room to titrate BB further   Acute PE:  - Bilateral small pulmonary emboli on CT despite INR of 3.8 on admission. Patient did report a subtherapeutic INR several weeks prior and coumadin was increased at that time   - currently on IV heparin - hematology consulted per primary, has not been on DOAC in the past due to drug interaction with phenobarbital  - LE dopplers negative for DVT   HFrEF/NICM: - Patient has  known EF of 20-25%, recently had torsemide reduced at office visit with low REDs clip reading.  - Echo this admission showed EF 20%, moderately reduced RV function, normal Pulmonary artery systolic pressure.  - BNP >700 on admission with LE edema. - on IV lasix 626mBID. Output 2.6 L urine yesterday and is currently net -2.7L since admission  - metoprolol 10028mID, planned for losartan at next office visit (Entresto not affordable). Consider starting losartan this admission, however BP soft and creatinine elevated  - Plan to resume jardiance and spiro as renal function improves    Hx of Seizure - difficult situation as he has now developed PE while on coumadin, but unable to use DOACs with pheno.  - Reports that he hasn't had a seizure in 20+ years    AKI:  - Cr 1.63>>1.70>>1.48>>1.52 - Continue to follow with diuresis - Avoid nephrotoxic medications    HTN - BP has been soft this admission  - Continue metoprolol 100m57mD   HLD - on statin   Hypokalemia:  - K+ 3.2 yesterday, now up to 3.9 after supplemenation - follow BMET as patient is diuresed       For questions or updates, please contact [facility] Please consult [email or web address] for contact info under        Signed, KathMargie Billet-C  07/28/2021, 7:20 AM    Personally seen and examined. Agree with above.  We will go ahead and resume warfarin.  I do not believe that long-term Lovenox injections is financially viable for him.  Also, PE seen on CT were very small subsegmental.  Back in March he was subtherapeutic at 1.7, 1.6, 1.9 transiently.  Certainly appreciate hematology's assistance. Since he is on phenobarbital, DOACs are not an option.  MarkCandee Furbish

## 2021-07-28 NOTE — Progress Notes (Signed)
Mobility Specialist Progress Note    07/28/21 1659  Mobility  Activity Ambulated with assistance in hallway  Level of Assistance Contact guard assist, steadying assist  Assistive Device Front wheel walker  Distance Ambulated (ft) 160 ft  Activity Response Tolerated well  $Mobility charge 1 Mobility   Pre-Mobility: 97 HR Post-Mobility: 110 HR  Pt received in bed and agreeable. No complaints on walk. Upon return, monitor alerting to tachy rate with HR 140s-170s. Left in chair with call bell in reach.    Hildred Alamin Mobility Specialist  Primary: 5N M.S. Phone: [phone] Secondary: 6N M.S. Phone: [phone]

## 2021-07-28 NOTE — Progress Notes (Addendum)
PROGRESS NOTE    Ruben Murray  EHU:[id number] DOB: 02-02-1946 DOA: 07/26/2021  PCP: [name], MD   Brief Narrative: This 76 year old male with PMH significant of chronic atrial fibrillation (on coumadin), hypertension, hyperlipidemia, mild pulmonary hypertension, non-ischemic cardiomyopathy (normal cath 09/8586) with systolic congestive heart failure (Echo 04/2021 EF 20-25%), pulmonary hypertension, obstructive sleep apnea on CPAP, obesity, COPD who presentd in the ED with complaints of generalized weakness.  EMS was called due to progressively worsening symptoms.  Patient was found to have A-fib with RVR with heart rate in 150s. Upon evaluation in the ED found to have INR 3.8.  CTA chest shows small segmental subsegmental pulmonary emboli with right heart strain.  Patient is started on heparin gtt.  Hematology is consulted for anticoagulation, cardiology is consulted for A-fib with RVR. Given his hx of seizures , on phenobarbitol, DOAC is not an option, resumed on coumadin.  Assessment & Plan:   Principal Problem:   Acute pulmonary embolism (HCC) Active Problems:   Atrial fibrillation with rapid ventricular response (HCC)   Acute on chronic systolic CHF (congestive heart failure) (HCC)   AKI (acute kidney injury) (Ensign)   Essential hypertension   COPD (chronic obstructive pulmonary disease) (HCC)   Mixed hyperlipidemia   OSA on CPAP  Acute pulmonary embolism: Patient presented with several weeks history of generalized weakness found to have bilateral pulmonary emboli on CT chest while being on Coumadin. Patient reports compliance with this medication.  Patient does report several weeks ago his INR was subtherapeutic requiring his PCP to increase his dose of Coumadin. Patient is started on IV heparin.  Hematology consulted to determine if anticoagulant can be switched to different one. Continue supplemental oxygen as needed. Since patient is on phenobarbital given history of  seizures, DOAC is not an option.  Patient is started on Coumadin.  Atrial fibrillation with RVR: Patient presented with A-fib with RVR.  Heart rate controlled after giving 5 mg IV metoprolol.   Heart rate continued to remain elevated in the setting of CHF and acute PE.   Continue metoprolol 100 mg twice daily.  Cardiology suggested to resume Coumadin.  AKI on CKD stage IIIb: Serum creatinine last month 1.3, presented with creatinine 1.73 Could be multifactorial.  Patient appears volume overloaded at this time. Avoid nephrotoxic medications, monitor renal functions.  Acute on chronic systolic CHF: On exam patient has bibasilar crackles, pitting edema, elevated JVD. Patient was given Lasix, felt much improved. Continue Lasix 60 mg iv twice daily. Monitor daily intake output charting.  2.7 net negative balance. Echocardiogram this morning shows LVEF 20%. Continue metoprolol 100 mg twice daily, consider starting losartan if blood pressure and creatinine improves.  Essential hypertension: Continue metoprolol, Lasix.  COPD: No evidence of exacerbation at this time.   Continue home bronchodilators.  Hyperlipidemia: Continue Pravsatatin  OSA on CPAP: Continue CPAP  History of seizures.: Patient has not developed PE while being on Coumadin. Unable to use DOAC with phenobarbital.    DVT prophylaxis: Heparin gtt. Code Status: Full code Family Communication: No family at bedside Disposition Plan:  Status is: Inpatient Remains inpatient appropriate because: Admitted for acute PE while being on Coumadin.  Acute on chronic CHF exacerbation, AKI on CKD stage IIIb patient is improving.    Consultants:  Cardiology  Procedures: Echocardiogram Antimicrobials: None   subjective: Patient was seen and examined at bedside.  Overnight events noted.  Patient reports feeling improved.  Heart rate is now better controlled.  Objective: Vitals:  07/27/21 1926 07/27/21 2133 07/28/21 0106  07/28/21 0825  BP: 110/74  107/76 109/73  Pulse: 97 (!) 107 (!) 103 93  Resp: 18  18   Temp: 98.1 F (36.7 C)  97.6 F (36.4 C)   TempSrc:   Oral   SpO2:   95%   Weight:      Height:        Intake/Output Summary (Last 24 hours) at 07/28/2021 1334 Last data filed at 07/28/2021 1118 Gross per 24 hour  Intake 120 ml  Output 1650 ml  Net -1530 ml   Filed Weights   07/26/21 1752 07/27/21 0037  Weight: 104.3 kg 105.3 kg    Examination:  General exam: Appears comfortable, not in any acute distress.  Deconditioned Respiratory system: CTA bilaterally, no wheezing, no crackles, normal respiratory effort. Cardiovascular system: S1 & S2 heard, regular rate and rhythm, no murmur. Gastrointestinal system: Abdomen is soft, non tender, non distended, BS+ Central nervous system: Alert and oriented. No focal neurological deficits. Extremities: Edema ++ ,  no cyanosis, no clubbing. Skin: No rashes, lesions or ulcers Psychiatry: Judgement and insight appear normal. Mood & affect appropriate.     Data Reviewed: I have personally reviewed following labs and imaging studies  CBC: Recent Labs  Lab 07/26/21 1757 07/27/21 0205 07/27/21 1231 07/28/21 0315  WBC 9.1 8.5 7.2 8.8  NEUTROABS  --  5.2  --   --   HGB 12.8* 12.4* 12.2* 12.5*  HCT 40.3 38.6* 37.2* 39.1  MCV 94.4 92.6 92.1 92.2  PLT 151 134* 129* 086*   Basic Metabolic Panel: Recent Labs  Lab 07/26/21 1208 07/26/21 1757 07/27/21 0205 07/27/21 1231 07/28/21 0315  NA 138 137 139 141 138  K 4.2 3.9 4.2 3.2* 3.9  CL 101 99 101 101 99  CO2 _0 32 31  GLUCOSE 92 123* 96 141* 100*  BUN 35* 36* 30* 28* 26*  CREATININE 1.63* 1.73* 1.70* 1.48* 1.52*  CALCIUM 8.8* 8.6* 8.7* 8.5* 8.5*  MG  --  2.5* 2.4  --  2.4  PHOS  --   --   --   --  3.8   GFR: Estimated Creatinine Clearance: 47.1 mL/min (A) (by C-G formula based on SCr of 1.52 mg/dL (H)). Liver Function Tests: Recent Labs  Lab 07/27/21 0205  AST 23  ALT 17   ALKPHOS 122  BILITOT 0.5  PROT 6.5  ALBUMIN 3.4*   No results for input(s): LIPASE, AMYLASE in the last 168 hours. No results for input(s): AMMONIA in the last 168 hours. Coagulation Profile: Recent Labs  Lab 07/26/21 2025 07/27/21 0205 07/27/21 1231 07/28/21 0315  INR 3.8* 3.7* 2.8* 2.8*   Cardiac Enzymes: No results for input(s): CKTOTAL, CKMB, CKMBINDEX, TROPONINI in the last 168 hours. BNP (last 3 results) No results for input(s): PROBNP in the last 8760 hours. HbA1C: No results for input(s): HGBA1C in the last 72 hours. CBG: No results for input(s): GLUCAP in the last 168 hours. Lipid Profile: No results for input(s): CHOL, HDL, LDLCALC, TRIG, CHOLHDL, LDLDIRECT in the last 72 hours. Thyroid Function Tests: No results for input(s): TSH, T4TOTAL, FREET4, T3FREE, THYROIDAB in the last 72 hours. Anemia Panel: No results for input(s): VITAMINB12, FOLATE, FERRITIN, TIBC, IRON, RETICCTPCT in the last 72 hours. Sepsis Labs: No results for input(s): PROCALCITON, LATICACIDVEN in the last 168 hours.  Recent Results (from the past 240 hour(s))  Resp Panel by RT-PCR (Flu A&B, Covid) Nasopharyngeal Swab     Status:  None   Collection Time: 07/26/21 10:00 PM   Specimen: Nasopharyngeal Swab; Nasopharyngeal(NP) swabs in vial transport medium  Result Value Ref Range Status   SARS Coronavirus 2 by RT PCR NEGATIVE NEGATIVE Final    Comment: (NOTE) SARS-CoV-2 target nucleic acids are NOT DETECTED.  The SARS-CoV-2 RNA is generally detectable in upper respiratory specimens during the acute phase of infection. The lowest concentration of SARS-CoV-2 viral copies this assay can detect is 138 copies/mL. A negative result does not preclude SARS-Cov-2 infection and should not be used as the sole basis for treatment or other patient management decisions. A negative result may occur with  improper specimen collection/handling, submission of specimen other than nasopharyngeal swab, presence  of viral mutation(s) within the areas targeted by this assay, and inadequate number of viral copies(<138 copies/mL). A negative result must be combined with clinical observations, patient history, and epidemiological information. The expected result is Negative.  Fact Sheet for Patients:  EntrepreneurPulse.com.au  Fact Sheet for Healthcare Providers:  IncredibleEmployment.be  This test is no t yet approved or cleared by the [place] FDA and  has been authorized for detection and/or diagnosis of SARS-CoV-2 by FDA under an Emergency Use Authorization (EUA). This EUA will remain  in effect (meaning this test can be used) for the duration of the COVID-19 declaration under Section 564(b)(1) of the Act, 21 U.S.C.section 360bbb-3(b)(1), unless the authorization is terminated  or revoked sooner.       Influenza A by PCR NEGATIVE NEGATIVE Final   Influenza B by PCR NEGATIVE NEGATIVE Final    Comment: (NOTE) The Xpert Xpress SARS-CoV-2/FLU/RSV plus assay is intended as an aid in the diagnosis of influenza from Nasopharyngeal swab specimens and should not be used as a sole basis for treatment. Nasal washings and aspirates are unacceptable for Xpert Xpress SARS-CoV-2/FLU/RSV testing.  Fact Sheet for Patients: EntrepreneurPulse.com.au  Fact Sheet for Healthcare Providers: IncredibleEmployment.be  This test is not yet approved or cleared by the [place] FDA and has been authorized for detection and/or diagnosis of SARS-CoV-2 by FDA under an Emergency Use Authorization (EUA). This EUA will remain in effect (meaning this test can be used) for the duration of the COVID-19 declaration under Section 564(b)(1) of the Act, 21 U.S.C. section 360bbb-3(b)(1), unless the authorization is terminated or revoked.  Performed at [facility], [place] [address], [place], [place] [address]          Radiology  Studies: CT Angio Chest PE W and/or Wo Contrast  Result Date: 07/26/2021 CLINICAL DATA:  Pulmonary embolism suspected, high probability. Weakness, tachycardia. EXAM: CT ANGIOGRAPHY CHEST WITH CONTRAST TECHNIQUE: Multidetector CT imaging of the chest was performed using the standard protocol during bolus administration of intravenous contrast. Multiplanar CT image reconstructions and MIPs were obtained to evaluate the vascular anatomy. RADIATION DOSE REDUCTION: This exam was performed according to the departmental dose-optimization program which includes automated exposure control, adjustment of the mA and/or kV according to patient size and/or use of iterative reconstruction technique. CONTRAST:  43m OMNIPAQUE IOHEXOL 350 MG/ML SOLN COMPARISON:  04/25/2016. FINDINGS: Cardiovascular: The heart is enlarged and there is no pericardial effusion. Scattered coronary artery calcifications are noted. There is atherosclerotic calcification of the aorta without evidence of aneurysm. The pulmonary trunk is normal in caliber. A few small segmental and subsegmental pulmonary artery filling defects are noted in the left lower lobe, right lower lobe, and right upper lobe. The right ventricle is distended suggesting underlying right heart strain. Mediastinum/Nodes: No mediastinal, hilar,  or axillary lymphadenopathy. The thyroid gland, trachea, and esophagus are within normal limits. Lungs/Pleura: Atelectasis is noted bilaterally. No effusion or pneumothorax. Upper Abdomen: There is reflux of contrast into the inferior vena cava and hepatic veins, which may be associated with right heart failure. No acute abnormality. Musculoskeletal: Mild degenerative changes are present in the thoracic spine. No acute osseous abnormality. Review of the MIP images confirms the above findings. IMPRESSION: 1. Small segmental and subsegmental pulmonary emboli bilaterally. The right ventricle is distended which may be associated with underlying  right heart strain. 2. Cardiomegaly with coronary artery calcifications. 3. Reflux of contrast into the inferior vena cava and hepatic veins, may be associated with right heart failure. 4. Aortic atherosclerosis. Critical findings reported to Dr. Regan Lemming at 9:55 p.m. Electronically Signed   By: Brett Fairy M.D.   On: 07/26/2021 21:57   DG Chest Portable 1 View  Result Date: 07/26/2021 CLINICAL DATA:  Near syncope EXAM: PORTABLE CHEST 1 VIEW COMPARISON:  05/17/2021, 04/19/2021, 10/29/2017 FINDINGS: Globular cardiac enlargement. No consolidation, pleural effusion, or pneumothorax. IMPRESSION: 1. Cardiomegaly with globular cardiac configuration as before which could be due to multi chamber enlargement or potential pericardial fluid. 2. No acute airspace disease Electronically Signed   By: Donavan Foil M.D.   On: 07/26/2021 18:47   ECHOCARDIOGRAM COMPLETE  Result Date: 07/27/2021    ECHOCARDIOGRAM REPORT   Patient Name:   SAAHIR PRUDE Date of Exam: 07/27/2021 Medical Rec #:  [id number]       Height:       68.0 in Accession #:    [id number]      Weight:       232.1 lb Date of Birth:  Oct 10, 1945       BSA:          2.177 m Patient Age:    10 years        BP:           122/78 mmHg Patient Gender: M               HR:           76 bpm. Exam Location:  Inpatient Procedure: 2D Echo, Cardiac Doppler and Color Doppler Indications:    I50.23 Acute on chronic systolic (congestive) heart failure  History:        Patient has prior history of Echocardiogram examinations, most                 recent 04/20/2021. Cardiomyopathy, Arrythmias:Atrial Flutter and                 Atrial Fibrillation, Signs/Symptoms:Shortness of Breath and                 Fatigue; Risk Factors:Sleep Apnea, Hypertension, Dyslipidemia                 and Former Smoker.  Sonographer:    Wilkie Aye RVT Referring Phys: [id number] [place]  Sonographer Comments: Suboptimal apical window. Patients inability to reposition. IMPRESSIONS  1. Left  ventricular ejection fraction, by estimation, is 20%. The left ventricle has severely decreased function. The left ventricle demonstrates global hypokinesis. The left ventricular internal cavity size was mildly dilated. Left ventricular diastolic parameters are indeterminate.  2. Right ventricular systolic function is moderately reduced. The right ventricular size is severely enlarged. There is normal pulmonary artery systolic pressure. The estimated right ventricular systolic pressure is 60.6 mmHg.  3. Left atrial size was mildly dilated.  4. Right atrial size was moderately dilated.  5. The mitral valve is normal in structure. Mild mitral valve regurgitation. No evidence of mitral stenosis.  6. Tricuspid valve regurgitation is moderate to severe.  7. The aortic valve is tricuspid. There is mild calcification of the aortic valve. Aortic valve regurgitation is mild. Aortic valve sclerosis is present, with no evidence of aortic valve stenosis.  8. Aortic dilatation noted. There is mild dilatation of the aortic root, measuring 37 mm.  9. The inferior vena cava is dilated in size with <50% respiratory variability, suggesting right atrial pressure of 15 mmHg. 10. The patient was in atrial fibrillation. Comparison(s): Left ventricular ejection fraction, by estimation, is 20 to 25%. FINDINGS  Left Ventricle: Left ventricular ejection fraction, by estimation, is 20%. The left ventricle has severely decreased function. The left ventricle demonstrates global hypokinesis. The left ventricular internal cavity size was mildly dilated. There is no left ventricular hypertrophy. Left ventricular diastolic parameters are indeterminate. Right Ventricle: The right ventricular size is severely enlarged. No increase in right ventricular wall thickness. Right ventricular systolic function is moderately reduced. There is normal pulmonary artery systolic pressure. The tricuspid regurgitant velocity is 2.23 m/s, and with an assumed right  atrial pressure of 15 mmHg, the estimated right ventricular systolic pressure is 01.6 mmHg. Left Atrium: Left atrial size was mildly dilated. Right Atrium: Right atrial size was moderately dilated. Pericardium: Trivial pericardial effusion is present. Mitral Valve: The mitral valve is normal in structure. Mild mitral valve regurgitation. No evidence of mitral valve stenosis. Tricuspid Valve: The tricuspid valve is normal in structure. Tricuspid valve regurgitation is moderate to severe. Aortic Valve: The aortic valve is tricuspid. There is mild calcification of the aortic valve. Aortic valve regurgitation is mild. Aortic valve sclerosis is present, with no evidence of aortic valve stenosis. Aortic valve mean gradient measures 1.0 mmHg. Aortic valve peak gradient measures 1.8 mmHg. Aortic valve area, by VTI measures 2.50 cm. Pulmonic Valve: The pulmonic valve was normal in structure. Pulmonic valve regurgitation is trivial. Aorta: Aortic dilatation noted. There is mild dilatation of the aortic root, measuring 37 mm. Venous: The inferior vena cava is dilated in size with less than 50% respiratory variability, suggesting right atrial pressure of 15 mmHg. IAS/Shunts: No atrial level shunt detected by color flow Doppler.  LEFT VENTRICLE PLAX 2D LVIDd:         6.20 cm LVIDs:         5.30 cm LV PW:         1.30 cm LV IVS:        1.10 cm LVOT diam:     2.20 cm      3D Volume EF: LV SV:         28           3D EF:        24 % LV SV Index:   13           LV EDV:       274 ml LVOT Area:     3.80 cm     LV ESV:       207 ml                             LV SV:        66 ml  LV Volumes (MOD) LV vol d, MOD A4C: 191.0 ml LV vol s, MOD A4C: 117.0 ml LV  SV MOD A4C:     191.0 ml RIGHT VENTRICLE            IVC RV Mid diam:    5.00 cm    IVC diam: 3.60 cm RV S prime:     8.05 cm/s TAPSE (M-mode): 2.0 cm LEFT ATRIUM           Index        RIGHT ATRIUM           Index LA diam:      4.90 cm 2.25 cm/m   RA Area:     36.60 cm LA Vol  (A4C): 47.8 ml 21.95 ml/m  RA Volume:   160.00 ml 73.49 ml/m  AORTIC VALVE                    PULMONIC VALVE AV Area (Vmax):    2.66 cm     PV Vmax:       0.54 m/s AV Area (Vmean):   2.13 cm     PV Peak grad:  1.1 mmHg AV Area (VTI):     2.50 cm AV Vmax:           66.20 cm/s AV Vmean:          54.200 cm/s AV VTI:            0.111 m AV Peak Grad:      1.8 mmHg AV Mean Grad:      1.0 mmHg LVOT Vmax:         46.27 cm/s LVOT Vmean:        30.300 cm/s LVOT VTI:          0.073 m LVOT/AV VTI ratio: 0.66 AR Vena Contracta: 0.50 cm  AORTA Ao Root diam: 3.70 cm Ao Asc diam:  3.20 cm TRICUSPID VALVE TR Peak grad:   19.9 mmHg TR Vmax:        223.00 cm/s  SHUNTS Systemic VTI:  0.07 m Systemic Diam: 2.20 cm Dalton McleanMD Electronically signed by Franki Monte Signature Date/Time: 07/27/2021/3:38:42 PM    Final    VAS [place] LOWER EXTREMITY VENOUS (DVT)  Result Date: 07/27/2021  Lower Venous DVT Study Patient Name:  LOGYN KENDRICK  Date of Exam:   07/27/2021 Medical Rec #: [id number]        Accession #:    [id number] Date of Birth: [birth year]        Patient Gender: M Patient Age:   52 years Exam Location:  [facility] Procedure:      VAS [place] LOWER EXTREMITY VENOUS (DVT) Referring Phys: Inda Merlin --------------------------------------------------------------------------------  Indications: Swelling, SOB.  Comparison       12-10-2019 Prior left lower extremity venous was negative for Study:           DVT. Performing Technologist: Darlin Coco RDMS, RVT  Examination Guidelines: A complete evaluation includes B-mode imaging, spectral Doppler, color Doppler, and power Doppler as needed of all accessible portions of each vessel. Bilateral testing is considered an integral part of a complete examination. Limited examinations for reoccurring indications may be performed as noted. The reflux portion of the exam is performed with the patient in reverse Trendelenburg.   +---------+---------------+---------+-----------+----------+--------------+ RIGHT    CompressibilityPhasicitySpontaneityPropertiesThrombus Aging +---------+---------------+---------+-----------+----------+--------------+ CFV      Full           Yes      Yes                                 +---------+---------------+---------+-----------+----------+--------------+  SFJ      Full                                                        +---------+---------------+---------+-----------+----------+--------------+ FV Prox  Full                                                        +---------+---------------+---------+-----------+----------+--------------+ FV Mid   Full                                                        +---------+---------------+---------+-----------+----------+--------------+ FV DistalFull                                                        +---------+---------------+---------+-----------+----------+--------------+ PFV      Full                                                        +---------+---------------+---------+-----------+----------+--------------+ POP      Full           Yes      Yes                                 +---------+---------------+---------+-----------+----------+--------------+ PTV      Full                                                        +---------+---------------+---------+-----------+----------+--------------+ PERO     Full                                                        +---------+---------------+---------+-----------+----------+--------------+ Gastroc  Full                                                        +---------+---------------+---------+-----------+----------+--------------+   +---------+---------------+---------+-----------+----------+--------------+ LEFT     CompressibilityPhasicitySpontaneityPropertiesThrombus Aging  +---------+---------------+---------+-----------+----------+--------------+ CFV      Full           Yes      Yes                                 +---------+---------------+---------+-----------+----------+--------------+  SFJ      Full                                                        +---------+---------------+---------+-----------+----------+--------------+ FV Prox  Full                                                        +---------+---------------+---------+-----------+----------+--------------+ FV Mid   Full                                                        +---------+---------------+---------+-----------+----------+--------------+ FV DistalFull                                                        +---------+---------------+---------+-----------+----------+--------------+ PFV      Full                                                        +---------+---------------+---------+-----------+----------+--------------+ POP      Full           Yes      Yes                                 +---------+---------------+---------+-----------+----------+--------------+ PTV      Full                                                        +---------+---------------+---------+-----------+----------+--------------+ PERO     Full                                                        +---------+---------------+---------+-----------+----------+--------------+ Gastroc  Full                                                        +---------+---------------+---------+-----------+----------+--------------+     Summary: RIGHT: - There is no evidence of deep vein thrombosis in the lower extremity.  - No cystic structure found in the popliteal fossa.  LEFT: - There is no evidence of deep vein thrombosis in the lower extremity.  - No cystic structure found in the popliteal fossa.  *  See table(s) above for measurements and observations. Electronically signed  by Harold Barban MD on 07/27/2021 at 8:54:31 PM.    Final     Scheduled Meds:  furosemide  60 mg Intravenous BID   loratadine  10 mg Oral Daily   metoprolol tartrate  100 mg Oral BID   multivitamin with minerals  1 tablet Oral Q1200   phenobarbital  97.2 mg Oral QAC supper   pravastatin  40 mg Oral QHS   warfarin  5 mg Oral ONCE-1600   Warfarin - Pharmacist Dosing Inpatient   Does not apply q1600   Continuous Infusions:   LOS: 2 days    Time spent: 50 mins   Aariona Momon, MD [facility]   If 7PM-7AM, please contact night-coverage

## 2021-07-28 NOTE — Progress Notes (Signed)
ANTICOAGULATION CONSULT NOTE - Follow Up Consult  Pharmacy Consult for heparin Indication:  PE/Afib  Labs: Recent Labs    07/26/21 1757 07/26/21 2025 07/26/21 2254 07/27/21 0205 07/27/21 1231 07/27/21 2257  HGB 12.8*  --   --  12.4* 12.2*  --   HCT 40.3  --   --  38.6* 37.2*  --   PLT 151  --   --  134* 129*  --   LABPROT  --  37.5*  --  36.6* 29.2*  --   INR  --  3.8*  --  3.7* 2.8*  --   HEPARINUNFRC  --   --   --   --  0.17* 0.59  CREATININE 1.73*  --   --  1.70* 1.48*  --   TROPONINIHS  --   --  7 7  --   --     Assessment/Plan:  76yo male therapeutic on heparin after rate change. Will continue infusion at current rate of 1650 units/hr and confirm stable with am labs.   Wynona Neat, PharmD, BCPS  07/28/2021,12:42 AM

## 2021-07-29 DIAGNOSIS — I2699 Other pulmonary embolism without acute cor pulmonale: Secondary | ICD-10-CM | POA: Diagnosis not present

## 2021-07-29 LAB — CBC
HCT: 38.4 % — ABNORMAL LOW (ref 39.0–52.0)
Hemoglobin: 12.8 g/dL — ABNORMAL LOW (ref 13.0–17.0)
MCH: 30.1 pg (ref 26.0–34.0)
MCHC: 33.3 g/dL (ref 30.0–36.0)
MCV: 90.4 fL (ref 80.0–100.0)
Platelets: 154 10*3/uL (ref 150–400)
RBC: 4.25 MIL/uL (ref 4.22–5.81)
RDW: 16.9 % — ABNORMAL HIGH (ref 11.5–15.5)
WBC: 8.1 10*3/uL (ref 4.0–10.5)
nRBC: 0 % (ref 0.0–0.2)

## 2021-07-29 LAB — COMPREHENSIVE METABOLIC PANEL
ALT: 16 U/L (ref 0–44)
AST: 21 U/L (ref 15–41)
Albumin: 3.3 g/dL — ABNORMAL LOW (ref 3.5–5.0)
Alkaline Phosphatase: 115 U/L (ref 38–126)
Anion gap: 9 (ref 5–15)
BUN: 25 mg/dL — ABNORMAL HIGH (ref 8–23)
CO2: 29 mmol/L (ref 22–32)
Calcium: 8.3 mg/dL — ABNORMAL LOW (ref 8.9–10.3)
Chloride: 97 mmol/L — ABNORMAL LOW (ref 98–111)
Creatinine, Ser: 1.58 mg/dL — ABNORMAL HIGH (ref 0.61–1.24)
GFR, Estimated: 44 mL/min — ABNORMAL LOW (ref >60)
Glucose, Bld: 149 mg/dL — ABNORMAL HIGH (ref 70–99)
Potassium: 3.3 mmol/L — ABNORMAL LOW (ref 3.5–5.1)
Sodium: 135 mmol/L (ref 135–145)
Total Bilirubin: 0.7 mg/dL (ref 0.3–1.2)
Total Protein: 6.4 g/dL — ABNORMAL LOW (ref 6.5–8.1)

## 2021-07-29 LAB — GLUCOSE, CAPILLARY: Glucose-Capillary: 167 mg/dL — ABNORMAL HIGH (ref 70–99)

## 2021-07-29 LAB — MAGNESIUM: Magnesium: 2.3 mg/dL (ref 1.7–2.4)

## 2021-07-29 LAB — PROTIME-INR
INR: 2.2 — ABNORMAL HIGH (ref 0.8–1.2)
Prothrombin Time: 23.9 seconds — ABNORMAL HIGH (ref 11.4–15.2)

## 2021-07-29 LAB — PHOSPHORUS: Phosphorus: 3.3 mg/dL (ref 2.5–4.6)

## 2021-07-29 MED ORDER — POTASSIUM CHLORIDE 20 MEQ PO PACK
40.0000 meq | PACK | Freq: Once | ORAL | Status: AC
  Administered 2021-07-29: 40 meq via ORAL
  Filled 2021-07-29: qty 2

## 2021-07-29 MED ORDER — WARFARIN SODIUM 5 MG PO TABS
5.0000 mg | ORAL_TABLET | Freq: Once | ORAL | Status: AC
  Administered 2021-07-29: 5 mg via ORAL
  Filled 2021-07-29: qty 1

## 2021-07-29 MED ORDER — DIGOXIN 125 MCG PO TABS
0.1250 mg | ORAL_TABLET | Freq: Every day | ORAL | Status: DC
  Administered 2021-07-29 – 2021-08-01 (×4): 0.125 mg via ORAL
  Filled 2021-07-29 (×4): qty 1

## 2021-07-29 NOTE — Progress Notes (Signed)
   07/29/21 0826  Assess: MEWS Score  Level of Consciousness Alert  Assess: MEWS Score  MEWS Temp 0  MEWS Systolic 1  MEWS Pulse 2  MEWS RR 0  MEWS LOC 0  MEWS Score 3  MEWS Score Color Yellow  Assess: if the MEWS score is Yellow or Red  Were vital signs taken at a resting state? Yes  Focused Assessment No change from prior assessment  Early Detection of Sepsis Score *See Row Information* Low  MEWS guidelines implemented *See Row Information* Yes  Treat  MEWS Interventions Escalated (See documentation below)  Pain Scale 0-10  Pain Score 0  Take Vital Signs  Increase Vital Sign Frequency  Yellow: Q 2hr X 2 then Q 4hr X 2, if remains yellow, continue Q 4hrs  Escalate  MEWS: Escalate Yellow: discuss with charge nurse/RN and consider discussing with provider and RRT  Notify: Charge Nurse/RN  Name of Charge Nurse/RN Notified Yoko  Date Charge Nurse/RN Notified 07/29/21  Time Charge Nurse/RN Notified [id number]  Notify: Provider  Provider Name/Title Melina Copa, PA  Date Provider Notified 07/29/21  Method of Notification Page;Call

## 2021-07-29 NOTE — Progress Notes (Signed)
PROGRESS NOTE    Ruben Murray  KAJ:[id number] DOB: 06-30-1945 DOA: 07/26/2021  PCP: [name], MD   Brief Narrative: This 76 year old male with PMH significant of chronic atrial fibrillation (on coumadin), hypertension, hyperlipidemia, mild pulmonary hypertension, non-ischemic cardiomyopathy (normal cath 0/3559) with systolic congestive heart failure (Echo 04/2021 EF 20-25%), pulmonary hypertension, obstructive sleep apnea on CPAP, obesity, COPD who presentd in the ED with complaints of generalized weakness.  EMS was called due to progressively worsening symptoms.  Patient was found to have A-fib with RVR with heart rate in 150s. Upon evaluation in the ED found to have INR 3.8.  CTA chest shows small segmental subsegmental pulmonary emboli with right heart strain.  Patient is started on heparin gtt.  Hematology is consulted for anticoagulation, cardiology is consulted for A-fib with RVR. Given his hx of seizures , on phenobarbitol, DOAC is not an option, resumed on coumadin.  Assessment & Plan:   Principal Problem:   Acute pulmonary embolism (HCC) Active Problems:   Atrial fibrillation with rapid ventricular response (HCC)   Acute on chronic systolic CHF (congestive heart failure) (HCC)   AKI (acute kidney injury) ([place])   Essential hypertension   COPD (chronic obstructive pulmonary disease) (HCC)   Mixed hyperlipidemia   OSA on CPAP  Acute pulmonary embolism: Patient presented with several weeks history of generalized weakness. He is found to have bilateral pulmonary emboli on CT chest while being on Coumadin. Patient reports compliance with this medication.  Patient does report several weeks ago his INR was subtherapeutic requiring his PCP to increase his dose of Coumadin. Patient is started on IV heparin.  Hematology consulted to determine if anticoagulant can be switched to different one. Continue supplemental oxygen as needed. Since patient is on phenobarbital given history of  seizures, DOAC is not an option.  Patient is started on Coumadin.  Atrial fibrillation with RVR: Patient presented with A-fib with RVR.  Heart rate controlled after giving 5 mg IV metoprolol.   Heart rate continued to remain elevated in the setting of CHF and acute PE.   Continue metoprolol 100 mg twice daily.  Cardiology suggested to resume Coumadin. Patient has an episode of A-fib with RVR while having breakfast today.   Cardiology has added digoxin low-dose. Continue to [place].  AKI on CKD stage IIIb: Serum creatinine last month 1.3, presented with creatinine 1.73 Could be multifactorial.  Patient appears volume overloaded at this time. Avoid nephrotoxic medications, renal functions improving.  Acute on chronic systolic CHF: On exam patient has bibasilar crackles, pitting edema, elevated JVD. Patient was given Lasix, felt much improved. Continue Lasix 60 mg iv twice daily. Monitor daily intake output charting.  2.7 net negative balance. Echocardiogram this admission shows LVEF 20%. Continue metoprolol 100 mg twice daily, consider starting losartan if blood pressure and creatinine improves.  Essential hypertension: Continue metoprolol, Lasix.  COPD: No evidence of exacerbation at this time.   Continue home bronchodilators.  Hyperlipidemia: Continue Pravastatin  OSA on CPAP: Continue CPAP  History of seizures.: Patient has developed PE while being on Coumadin. Unable to use DOAC with phenobarbital.    DVT prophylaxis: Coumadin Code Status: Full code Family Communication: No family at bedside Disposition Plan:  Status is: Inpatient Remains inpatient appropriate because: Admitted for acute PE while being on Coumadin.  Acute on chronic CHF exacerbation, AKI on CKD stage IIIb. Patient is improving.  Anticipated discharge home in 1 to 2 days.    Consultants:  Cardiology  Procedures: Echocardiogram Antimicrobials: None  subjective: Patient was seen and examined  at bedside.  Overnight events noted.   Patient had an episode of A-fib with RVR while having breakfast in the morning,  his heart rate went up to 160s. Patient was given morning metoprolol, heart rate down to 118 now.  He reports feeling better now.  Objective: Vitals:   07/29/21 0828 07/29/21 0900 07/29/21 1017 07/29/21 1157  BP: 98/71 103/70    Pulse: (!) 117 (!) 104  98  Resp: 18 18    Temp: 98 F (36.7 C) 98.7 F (37.1 C)    TempSrc:      SpO2:      Weight:   104 kg   Height:        Intake/Output Summary (Last 24 hours) at 07/29/2021 1212 Last data filed at 07/29/2021 0630 Gross per 24 hour  Intake 360 ml  Output 600 ml  Net -240 ml   Filed Weights   07/26/21 1752 07/27/21 0037 07/29/21 1017  Weight: 104.3 kg 105.3 kg 104 kg    Examination:  General exam: Appears comfortable, not in any acute distress.  Deconditioned Respiratory system: CTA bilaterally, normal respiratory effort.  RR 15 Cardiovascular system: S1 & S2 heard, regular rate and rhythm, no murmur. Gastrointestinal system: Abdomen is soft, non tender, non distended, BS+ Central nervous system: Alert and oriented X 3 . No focal neurological deficits. Extremities: Edema+, no clubbing, no cyanosis. Skin: No rashes, lesions or ulcers Psychiatry: Judgement and insight appear normal. Mood & affect appropriate.     Data Reviewed: I have personally reviewed following labs and imaging studies  CBC: Recent Labs  Lab 07/26/21 1757 07/27/21 0205 07/27/21 1231 07/28/21 0315 07/29/21 0321  WBC 9.1 8.5 7.2 8.8 8.1  NEUTROABS  --  5.2  --   --   --   HGB 12.8* 12.4* 12.2* 12.5* 12.8*  HCT 40.3 38.6* 37.2* 39.1 38.4*  MCV 94.4 92.6 92.1 92.2 90.4  PLT 151 134* 129* 144* 712   Basic Metabolic Panel: Recent Labs  Lab 07/26/21 1757 07/27/21 0205 07/27/21 1231 07/28/21 0315 07/29/21 0321  NA 137 139 141 138 135  K 3.9 4.2 3.2* 3.9 3.3*  CL 99 101 101 99 97*  CO2 28 30 32 31 29  GLUCOSE 123* 96 141*  100* 149*  BUN 36* 30* 28* 26* 25*  CREATININE 1.73* 1.70* 1.48* 1.52* 1.58*  CALCIUM 8.6* 8.7* 8.5* 8.5* 8.3*  MG 2.5* 2.4  --  2.4 2.3  PHOS  --   --   --  3.8 3.3   GFR: Estimated Creatinine Clearance: 45 mL/min (A) (by C-G formula based on SCr of 1.58 mg/dL (H)). Liver Function Tests: Recent Labs  Lab 07/27/21 0205 07/29/21 0321  AST 23 21  ALT 17 16  ALKPHOS 122 115  BILITOT 0.5 0.7  PROT 6.5 6.4*  ALBUMIN 3.4* 3.3*   No results for input(s): LIPASE, AMYLASE in the last 168 hours. No results for input(s): AMMONIA in the last 168 hours. Coagulation Profile: Recent Labs  Lab 07/26/21 2025 07/27/21 0205 07/27/21 1231 07/28/21 0315 07/29/21 0321  INR 3.8* 3.7* 2.8* 2.8* 2.2*   Cardiac Enzymes: No results for input(s): CKTOTAL, CKMB, CKMBINDEX, TROPONINI in the last 168 hours. BNP (last 3 results) No results for input(s): PROBNP in the last 8760 hours. HbA1C: No results for input(s): HGBA1C in the last 72 hours. CBG: Recent Labs  Lab 07/29/21 0911  GLUCAP 167*   Lipid Profile: No results for input(s): CHOL,  HDL, LDLCALC, TRIG, CHOLHDL, LDLDIRECT in the last 72 hours. Thyroid Function Tests: No results for input(s): TSH, T4TOTAL, FREET4, T3FREE, THYROIDAB in the last 72 hours. Anemia Panel: No results for input(s): VITAMINB12, FOLATE, FERRITIN, TIBC, IRON, RETICCTPCT in the last 72 hours. Sepsis Labs: No results for input(s): PROCALCITON, LATICACIDVEN in the last 168 hours.  Recent Results (from the past 240 hour(s))  Resp Panel by RT-PCR (Flu A&B, Covid) Nasopharyngeal Swab     Status: None   Collection Time: 07/26/21 10:00 PM   Specimen: Nasopharyngeal Swab; Nasopharyngeal(NP) swabs in vial transport medium  Result Value Ref Range Status   SARS Coronavirus 2 by RT PCR NEGATIVE NEGATIVE Final    Comment: (NOTE) SARS-CoV-2 target nucleic acids are NOT DETECTED.  The SARS-CoV-2 RNA is generally detectable in upper respiratory specimens during the acute  phase of infection. The lowest concentration of SARS-CoV-2 viral copies this assay can detect is 138 copies/mL. A negative result does not preclude SARS-Cov-2 infection and should not be used as the sole basis for treatment or other patient management decisions. A negative result may occur with  improper specimen collection/handling, submission of specimen other than nasopharyngeal swab, presence of viral mutation(s) within the areas targeted by this assay, and inadequate number of viral copies(<138 copies/mL). A negative result must be combined with clinical observations, patient history, and epidemiological information. The expected result is Negative.  Fact Sheet for Patients:  EntrepreneurPulse.com.au  Fact Sheet for Healthcare Providers:  IncredibleEmployment.be  This test is no t yet approved or cleared by the [place] FDA and  has been authorized for detection and/or diagnosis of SARS-CoV-2 by FDA under an Emergency Use Authorization (EUA). This EUA will remain  in effect (meaning this test can be used) for the duration of the COVID-19 declaration under Section 564(b)(1) of the Act, 21 U.S.C.section 360bbb-3(b)(1), unless the authorization is terminated  or revoked sooner.       Influenza A by PCR NEGATIVE NEGATIVE Final   Influenza B by PCR NEGATIVE NEGATIVE Final    Comment: (NOTE) The Xpert Xpress SARS-CoV-2/FLU/RSV plus assay is intended as an aid in the diagnosis of influenza from Nasopharyngeal swab specimens and should not be used as a sole basis for treatment. Nasal washings and aspirates are unacceptable for Xpert Xpress SARS-CoV-2/FLU/RSV testing.  Fact Sheet for Patients: EntrepreneurPulse.com.au  Fact Sheet for Healthcare Providers: IncredibleEmployment.be  This test is not yet approved or cleared by the [place] FDA and has been authorized for detection and/or diagnosis of  SARS-CoV-2 by FDA under an Emergency Use Authorization (EUA). This EUA will remain in effect (meaning this test can be used) for the duration of the COVID-19 declaration under Section 564(b)(1) of the Act, 21 U.S.C. section 360bbb-3(b)(1), unless the authorization is terminated or revoked.  Performed at [facility], [place] [address], [place], [place] [address]          Radiology Studies: ECHOCARDIOGRAM COMPLETE  Result Date: 07/27/2021    ECHOCARDIOGRAM REPORT   Patient Name:   NAZIER NEYHART Date of Exam: 07/27/2021 Medical Rec #:  [id number]       Height:       68.0 in Accession #:    [id number]      Weight:       232.1 lb Date of Birth:  [birth year]       BSA:          2.177 m Patient Age:    42 years  BP:           122/78 mmHg Patient Gender: M               HR:           76 bpm. Exam Location:  Inpatient Procedure: 2D Echo, Cardiac Doppler and Color Doppler Indications:    I50.23 Acute on chronic systolic (congestive) heart failure  History:        Patient has prior history of Echocardiogram examinations, most                 recent 04/20/2021. Cardiomyopathy, Arrythmias:Atrial Flutter and                 Atrial Fibrillation, Signs/Symptoms:Shortness of Breath and                 Fatigue; Risk Factors:Sleep Apnea, Hypertension, Dyslipidemia                 and Former Smoker.  Sonographer:    Wilkie Aye RVT Referring Phys: [id number] [place]  Sonographer Comments: Suboptimal apical window. Patients inability to reposition. IMPRESSIONS  1. Left ventricular ejection fraction, by estimation, is 20%. The left ventricle has severely decreased function. The left ventricle demonstrates global hypokinesis. The left ventricular internal cavity size was mildly dilated. Left ventricular diastolic parameters are indeterminate.  2. Right ventricular systolic function is moderately reduced. The right ventricular size is severely enlarged. There is normal pulmonary artery systolic pressure.  The estimated right ventricular systolic pressure is 00.3 mmHg.  3. Left atrial size was mildly dilated.  4. Right atrial size was moderately dilated.  5. The mitral valve is normal in structure. Mild mitral valve regurgitation. No evidence of mitral stenosis.  6. Tricuspid valve regurgitation is moderate to severe.  7. The aortic valve is tricuspid. There is mild calcification of the aortic valve. Aortic valve regurgitation is mild. Aortic valve sclerosis is present, with no evidence of aortic valve stenosis.  8. Aortic dilatation noted. There is mild dilatation of the aortic root, measuring 37 mm.  9. The inferior vena cava is dilated in size with <50% respiratory variability, suggesting right atrial pressure of 15 mmHg. 10. The patient was in atrial fibrillation. Comparison(s): Left ventricular ejection fraction, by estimation, is 20 to 25%. FINDINGS  Left Ventricle: Left ventricular ejection fraction, by estimation, is 20%. The left ventricle has severely decreased function. The left ventricle demonstrates global hypokinesis. The left ventricular internal cavity size was mildly dilated. There is no left ventricular hypertrophy. Left ventricular diastolic parameters are indeterminate. Right Ventricle: The right ventricular size is severely enlarged. No increase in right ventricular wall thickness. Right ventricular systolic function is moderately reduced. There is normal pulmonary artery systolic pressure. The tricuspid regurgitant velocity is 2.23 m/s, and with an assumed right atrial pressure of 15 mmHg, the estimated right ventricular systolic pressure is 70.4 mmHg. Left Atrium: Left atrial size was mildly dilated. Right Atrium: Right atrial size was moderately dilated. Pericardium: Trivial pericardial effusion is present. Mitral Valve: The mitral valve is normal in structure. Mild mitral valve regurgitation. No evidence of mitral valve stenosis. Tricuspid Valve: The tricuspid valve is normal in structure.  Tricuspid valve regurgitation is moderate to severe. Aortic Valve: The aortic valve is tricuspid. There is mild calcification of the aortic valve. Aortic valve regurgitation is mild. Aortic valve sclerosis is present, with no evidence of aortic valve stenosis. Aortic valve mean gradient measures 1.0 mmHg. Aortic valve peak gradient measures 1.8  mmHg. Aortic valve area, by VTI measures 2.50 cm. Pulmonic Valve: The pulmonic valve was normal in structure. Pulmonic valve regurgitation is trivial. Aorta: Aortic dilatation noted. There is mild dilatation of the aortic root, measuring 37 mm. Venous: The inferior vena cava is dilated in size with less than 50% respiratory variability, suggesting right atrial pressure of 15 mmHg. IAS/Shunts: No atrial level shunt detected by color flow Doppler.  LEFT VENTRICLE PLAX 2D LVIDd:         6.20 cm LVIDs:         5.30 cm LV PW:         1.30 cm LV IVS:        1.10 cm LVOT diam:     2.20 cm      3D Volume EF: LV SV:         28           3D EF:        24 % LV SV Index:   13           LV EDV:       274 ml LVOT Area:     3.80 cm     LV ESV:       207 ml                             LV SV:        66 ml  LV Volumes (MOD) LV vol d, MOD A4C: 191.0 ml LV vol s, MOD A4C: 117.0 ml LV SV MOD A4C:     191.0 ml RIGHT VENTRICLE            IVC RV Mid diam:    5.00 cm    IVC diam: 3.60 cm RV S prime:     8.05 cm/s TAPSE (M-mode): 2.0 cm LEFT ATRIUM           Index        RIGHT ATRIUM           Index LA diam:      4.90 cm 2.25 cm/m   RA Area:     36.60 cm LA Vol (A4C): 47.8 ml 21.95 ml/m  RA Volume:   160.00 ml 73.49 ml/m  AORTIC VALVE                    PULMONIC VALVE AV Area (Vmax):    2.66 cm     PV Vmax:       0.54 m/s AV Area (Vmean):   2.13 cm     PV Peak grad:  1.1 mmHg AV Area (VTI):     2.50 cm AV Vmax:           66.20 cm/s AV Vmean:          54.200 cm/s AV VTI:            0.111 m AV Peak Grad:      1.8 mmHg AV Mean Grad:      1.0 mmHg LVOT Vmax:         46.27 cm/s LVOT Vmean:         30.300 cm/s LVOT VTI:          0.073 m LVOT/AV VTI ratio: 0.66 AR Vena Contracta: 0.50 cm  AORTA Ao Root diam: 3.70 cm Ao Asc diam:  3.20 cm TRICUSPID VALVE TR Peak grad:   19.9 mmHg TR Vmax:  223.00 cm/s  SHUNTS Systemic VTI:  0.07 m Systemic Diam: 2.20 cm Dalton McleanMD Electronically signed by Franki Monte Signature Date/Time: 07/27/2021/3:38:42 PM    Final     Scheduled Meds:  digoxin  0.125 mg Oral Daily   loratadine  10 mg Oral Daily   metoprolol tartrate  100 mg Oral BID   multivitamin with minerals  1 tablet Oral Q1200   phenobarbital  97.2 mg Oral QAC supper   pravastatin  40 mg Oral QHS   warfarin  5 mg Oral ONCE-1600   Warfarin - Pharmacist Dosing Inpatient   Does not apply q1600   Continuous Infusions:   LOS: 3 days    Time spent: 35 mins   Modesta Sammons, MD [facility]   If 7PM-7AM, please contact night-coverage

## 2021-07-29 NOTE — Care Management Important Message (Signed)
Important Message  Patient Details  Name: ELIH MOONEY MRN: [id number] Date of Birth: 09/16/1945   Medicare Important Message Given:  Yes     [name] 07/29/2021, 9:52 AM

## 2021-07-29 NOTE — Progress Notes (Signed)
Mobility Specialist Progress Note    07/29/21 1140  Mobility  Activity Contraindicated/medical hold   RN advised. Will f/u when appropriate.     Hildred Alamin Mobility Specialist  Primary: 5N M.S. Phone: [phone] Secondary: 6N M.S. Phone: [phone]

## 2021-07-29 NOTE — Progress Notes (Signed)
Pt having Afib RVR up to 170's HR non sustaining while pt eating his breakfast. Pt stated he feels weak, dizzy and lightheaded. Metoprolol 133m PO given earlier. Dr. KDwyane Deewas notified. Dr KDwyane Deecalled and he said he is on his way to see the pt. Will endorse it to the morning RN.

## 2021-07-29 NOTE — Progress Notes (Addendum)
Progress Note  Patient Name: Ruben [place] Date of Encounter: 07/29/2021  Primary Cardiologist: [name], MD  Subjective   Feels weak as a wash rag today. HR was up to 160s-170s just eating breakfast, now 105 sitting in chair with feet up. States his breathing feels "like a horse." No CP.   Inpatient Medications    Scheduled Meds:  furosemide  60 mg Intravenous BID   loratadine  10 mg Oral Daily   metoprolol tartrate  100 mg Oral BID   multivitamin with minerals  1 tablet Oral Q1200   phenobarbital  97.2 mg Oral QAC supper   pravastatin  40 mg Oral QHS   Warfarin - Pharmacist Dosing Inpatient   Does not apply q1600   Continuous Infusions:  PRN Meds: acetaminophen **OR** acetaminophen, albuterol, fluticasone, hydrALAZINE, polyethylene glycol   Vital Signs    Vitals:   07/28/21 2021 07/28/21 2155 07/29/21 0600 07/29/21 0710  BP: 101/74 102/77 92/62 98/71  Pulse: 90 93 90 (!) 117  Resp: 18  17   Temp: 98.3 F (36.8 C)  98.3 F (36.8 C)   TempSrc: Oral  Oral   SpO2: 95%  96%   Weight:      Height:        Intake/Output Summary (Last 24 hours) at 07/29/2021 0813 Last data filed at 07/29/2021 0630 Gross per 24 hour  Intake 360 ml  Output 900 ml  Net -540 ml      07/27/2021   12:37 AM 07/26/2021    5:52 PM 07/26/2021    5:05 PM  Last 3 Weights  Weight (lbs) 232 lb 1.6 oz 230 lb 229 lb  Weight (kg) 105.28 kg 104.327 kg 103.874 kg     Telemetry    AF RVR earlier now 105-115 - Personally Reviewed  Physical Exam   GEN: No acute distress.  HEENT: Normocephalic, atraumatic, sclera non-icteric. Neck: No JVD or bruits. Cardiac: Irregularly irregular, rate borderline up, no murmurs, rubs, or gallops.  Respiratory: Mildly diminished otherwise no significant crackles, wheezing or rhonchi. Breathing is unlabored. GI: Soft, nontender, non-distended, BS +x 4. MS: no deformity. Extremities: No clubbing or cyanosis. No edema. Distal pedal pulses are 2+ and equal  bilaterally. Neuro:  AAOx3. Follows commands. Psych:  Responds to questions appropriately with a normal affect.  Labs    High Sensitivity Troponin:   Recent Labs  Lab 07/26/21 2254 07/27/21 0205  TROPONINIHS 7 7      Cardiac EnzymesNo results for input(s): TROPONINI in the last 168 hours. No results for input(s): TROPIPOC in the last 168 hours.   Chemistry Recent Labs  Lab 07/27/21 0205 07/27/21 1231 07/28/21 0315 07/29/21 0321  NA 139 141 138 135  K 4.2 3.2* 3.9 3.3*  CL 101 101 99 97*  CO2 30 32 31 29  GLUCOSE 96 141* 100* 149*  BUN 30* 28* 26* 25*  CREATININE 1.70* 1.48* 1.52* 1.58*  CALCIUM 8.7* 8.5* 8.5* 8.3*  PROT 6.5  --   --  6.4*  ALBUMIN 3.4*  --   --  3.3*  AST 23  --   --  21  ALT 17  --   --  16  ALKPHOS 122  --   --  115  BILITOT 0.5  --   --  0.7  GFRNONAA 41* 48* 47* 44*  ANIONGAP _0 Hematology Recent Labs  Lab 07/27/21 1231 07/28/21 0315 07/29/21 0321  WBC 7.2 8.8 8.1  RBC 4.04* 4.24 4.25  HGB 12.2* 12.5* 12.8*  HCT 37.2* 39.1 38.4*  MCV 92.1 92.2 90.4  MCH 30.2 29.5 30.1  MCHC 32.8 32.0 33.3  RDW 17.0* 17.0* 16.9*  PLT 129* 144* 154    BNP Recent Labs  Lab 07/26/21 1757  BNP 753.5*     DDimer No results for input(s): DDIMER in the last 168 hours.   Radiology    ECHOCARDIOGRAM COMPLETE  Result Date: 07/27/2021    ECHOCARDIOGRAM REPORT   Patient Name:   Ruben [facility] Date of Exam: 07/27/2021 Medical Rec #:  [id number]       Height:       68.0 in Accession #:    [id number]      Weight:       232.1 lb Date of Birth:  [birth year]       BSA:          2.177 m Patient Age:    76 years        BP:           122/78 mmHg Patient Gender: M               HR:           76 bpm. Exam Location:  Inpatient Procedure: 2D Echo, Cardiac Doppler and Color Doppler Indications:    I50.23 Acute on chronic systolic (congestive) heart failure  History:        Patient has prior history of Echocardiogram examinations, most                 recent  04/20/2021. Cardiomyopathy, Arrythmias:Atrial Flutter and                 Atrial Fibrillation, Signs/Symptoms:Shortness of Breath and                 Fatigue; Risk Factors:Sleep Apnea, Hypertension, Dyslipidemia                 and Former Smoker.  Sonographer:    Wilkie Aye RVT Referring Phys: [id number]  Sonographer Comments: Suboptimal apical window. Patients inability to reposition. IMPRESSIONS  1. Left ventricular ejection fraction, by estimation, is 20%. The left ventricle has severely decreased function. The left ventricle demonstrates global hypokinesis. The left ventricular internal cavity size was mildly dilated. Left ventricular diastolic parameters are indeterminate.  2. Right ventricular systolic function is moderately reduced. The right ventricular size is severely enlarged. There is normal pulmonary artery systolic pressure. The estimated right ventricular systolic pressure is 50.5 mmHg.  3. Left atrial size was mildly dilated.  4. Right atrial size was moderately dilated.  5. The mitral valve is normal in structure. Mild mitral valve regurgitation. No evidence of mitral stenosis.  6. Tricuspid valve regurgitation is moderate to severe.  7. The aortic valve is tricuspid. There is mild calcification of the aortic valve. Aortic valve regurgitation is mild. Aortic valve sclerosis is present, with no evidence of aortic valve stenosis.  8. Aortic dilatation noted. There is mild dilatation of the aortic root, measuring 37 mm.  9. The inferior vena cava is dilated in size with <50% respiratory variability, suggesting right atrial pressure of 15 mmHg. 10. The patient was in atrial fibrillation. Comparison(s): Left ventricular ejection fraction, by estimation, is 20 to 25%. FINDINGS  Left Ventricle: Left ventricular ejection fraction, by estimation, is 20%. The left ventricle has severely decreased function. The left ventricle demonstrates global hypokinesis. The left ventricular internal cavity  size was mildly dilated. There is no left ventricular hypertrophy. Left ventricular diastolic parameters are indeterminate. Right Ventricle: The right ventricular size is severely enlarged. No increase in right ventricular wall thickness. Right ventricular systolic function is moderately reduced. There is normal pulmonary artery systolic pressure. The tricuspid regurgitant velocity is 2.23 m/s, and with an assumed right atrial pressure of 15 mmHg, the estimated right ventricular systolic pressure is 26.3 mmHg. Left Atrium: Left atrial size was mildly dilated. Right Atrium: Right atrial size was moderately dilated. Pericardium: Trivial pericardial effusion is present. Mitral Valve: The mitral valve is normal in structure. Mild mitral valve regurgitation. No evidence of mitral valve stenosis. Tricuspid Valve: The tricuspid valve is normal in structure. Tricuspid valve regurgitation is moderate to severe. Aortic Valve: The aortic valve is tricuspid. There is mild calcification of the aortic valve. Aortic valve regurgitation is mild. Aortic valve sclerosis is present, with no evidence of aortic valve stenosis. Aortic valve mean gradient measures 1.0 mmHg. Aortic valve peak gradient measures 1.8 mmHg. Aortic valve area, by VTI measures 2.50 cm. Pulmonic Valve: The pulmonic valve was normal in structure. Pulmonic valve regurgitation is trivial. Aorta: Aortic dilatation noted. There is mild dilatation of the aortic root, measuring 37 mm. Venous: The inferior vena cava is dilated in size with less than 50% respiratory variability, suggesting right atrial pressure of 15 mmHg. IAS/Shunts: No atrial level shunt detected by color flow Doppler.  LEFT VENTRICLE PLAX 2D LVIDd:         6.20 cm LVIDs:         5.30 cm LV PW:         1.30 cm LV IVS:        1.10 cm LVOT diam:     2.20 cm      3D Volume EF: LV SV:         28           3D EF:        24 % LV SV Index:   13           LV EDV:       274 ml LVOT Area:     3.80 cm     LV  ESV:       207 ml                             LV SV:        66 ml  LV Volumes (MOD) LV vol d, MOD A4C: 191.0 ml LV vol s, MOD A4C: 117.0 ml LV SV MOD A4C:     191.0 ml RIGHT VENTRICLE            IVC RV Mid diam:    5.00 cm    IVC diam: 3.60 cm RV S prime:     8.05 cm/s TAPSE (M-mode): 2.0 cm LEFT ATRIUM           Index        RIGHT ATRIUM           Index LA diam:      4.90 cm 2.25 cm/m   RA Area:     36.60 cm LA Vol (A4C): 47.8 ml 21.95 ml/m  RA Volume:   160.00 ml 73.49 ml/m  AORTIC VALVE                    PULMONIC VALVE AV Area (Vmax):  2.66 cm     PV Vmax:       0.54 m/s AV Area (Vmean):   2.13 cm     PV Peak grad:  1.1 mmHg AV Area (VTI):     2.50 cm AV Vmax:           66.20 cm/s AV Vmean:          54.200 cm/s AV VTI:            0.111 m AV Peak Grad:      1.8 mmHg AV Mean Grad:      1.0 mmHg LVOT Vmax:         46.27 cm/s LVOT Vmean:        30.300 cm/s LVOT VTI:          0.073 m LVOT/AV VTI ratio: 0.66 AR Vena Contracta: 0.50 cm  AORTA Ao Root diam: 3.70 cm Ao Asc diam:  3.20 cm TRICUSPID VALVE TR Peak grad:   19.9 mmHg TR Vmax:        223.00 cm/s  SHUNTS Systemic VTI:  0.07 m Systemic Diam: 2.20 cm Dalton McleanMD Electronically signed by Franki Monte Signature Date/Time: 07/27/2021/3:38:42 PM    Final    VAS [place] LOWER EXTREMITY VENOUS (DVT)  Result Date: 07/27/2021  Lower Venous DVT Study Patient Name:  Ruben Murray  Date of Exam:   07/27/2021 Medical Rec #: [id number]        Accession #:    [id number] Date of Birth: 1945/05/12        Patient Gender: M Patient Age:   75 years Exam Location:  [facility] Procedure:      VAS [place] LOWER EXTREMITY VENOUS (DVT) Referring Phys: Inda Merlin --------------------------------------------------------------------------------  Indications: Swelling, SOB.  Comparison       12-10-2019 Prior left lower extremity venous was negative for Study:           DVT. Performing Technologist: Darlin Coco RDMS, RVT  Examination Guidelines: A complete evaluation  includes B-mode imaging, spectral Doppler, color Doppler, and power Doppler as needed of all accessible portions of each vessel. Bilateral testing is considered an integral part of a complete examination. Limited examinations for reoccurring indications may be performed as noted. The reflux portion of the exam is performed with the patient in reverse Trendelenburg.  +---------+---------------+---------+-----------+----------+--------------+ RIGHT    CompressibilityPhasicitySpontaneityPropertiesThrombus Aging +---------+---------------+---------+-----------+----------+--------------+ CFV      Full           Yes      Yes                                 +---------+---------------+---------+-----------+----------+--------------+ SFJ      Full                                                        +---------+---------------+---------+-----------+----------+--------------+ FV Prox  Full                                                        +---------+---------------+---------+-----------+----------+--------------+ FV Mid   Full                                                        +---------+---------------+---------+-----------+----------+--------------+  FV DistalFull                                                        +---------+---------------+---------+-----------+----------+--------------+ PFV      Full                                                        +---------+---------------+---------+-----------+----------+--------------+ POP      Full           Yes      Yes                                 +---------+---------------+---------+-----------+----------+--------------+ PTV      Full                                                        +---------+---------------+---------+-----------+----------+--------------+ PERO     Full                                                         +---------+---------------+---------+-----------+----------+--------------+ Gastroc  Full                                                        +---------+---------------+---------+-----------+----------+--------------+   +---------+---------------+---------+-----------+----------+--------------+ LEFT     CompressibilityPhasicitySpontaneityPropertiesThrombus Aging +---------+---------------+---------+-----------+----------+--------------+ CFV      Full           Yes      Yes                                 +---------+---------------+---------+-----------+----------+--------------+ SFJ      Full                                                        +---------+---------------+---------+-----------+----------+--------------+ FV Prox  Full                                                        +---------+---------------+---------+-----------+----------+--------------+ FV Mid   Full                                                        +---------+---------------+---------+-----------+----------+--------------+  FV DistalFull                                                        +---------+---------------+---------+-----------+----------+--------------+ PFV      Full                                                        +---------+---------------+---------+-----------+----------+--------------+ POP      Full           Yes      Yes                                 +---------+---------------+---------+-----------+----------+--------------+ PTV      Full                                                        +---------+---------------+---------+-----------+----------+--------------+ PERO     Full                                                        +---------+---------------+---------+-----------+----------+--------------+ Gastroc  Full                                                         +---------+---------------+---------+-----------+----------+--------------+     Summary: RIGHT: - There is no evidence of deep vein thrombosis in the lower extremity.  - No cystic structure found in the popliteal fossa.  LEFT: - There is no evidence of deep vein thrombosis in the lower extremity.  - No cystic structure found in the popliteal fossa.  *See table(s) above for measurements and observations. Electronically signed by Harold Barban MD on 07/27/2021 at 8:54:31 PM.    Final     Cardiac Studies   2D Echo 07/27/21 1. Left ventricular ejection fraction, by estimation, is 20%. The left  ventricle has severely decreased function. The left ventricle demonstrates  global hypokinesis. The left ventricular internal cavity size was mildly  dilated. Left ventricular  diastolic parameters are indeterminate.   2. Right ventricular systolic function is moderately reduced. The right  ventricular size is severely enlarged. There is normal pulmonary artery  systolic pressure. The estimated right ventricular systolic pressure is  93.7 mmHg.   3. Left atrial size was mildly dilated.   4. Right atrial size was moderately dilated.   5. The mitral valve is normal in structure. Mild mitral valve  regurgitation. No evidence of mitral stenosis.   6. Tricuspid valve regurgitation is moderate to severe.   7. The aortic valve is tricuspid. There is mild calcification of the  aortic valve. Aortic valve regurgitation is mild. Aortic valve  sclerosis  is present, with no evidence of aortic valve stenosis.   8. Aortic dilatation noted. There is mild dilatation of the aortic root,  measuring 37 mm.   9. The inferior vena cava is dilated in size with <50% respiratory  variability, suggesting right atrial pressure of 15 mmHg.  10. The patient was in atrial fibrillation.   Comparison(s): Left ventricular ejection fraction, by estimation, is 20 to  25%.   Patient Profile     76 y.o. male with chronic systolic  CHF/NICM, permanent atrial fibrillation and prior atrial flutter, chronic anticoag with Coumadin (not on DOAC 2/2 interaction with seizure med), cath 2021 with normal coronaries, pulmonary HTN (felt combination WHO group 2 & 3), mild MR/AI, moderate-severe TR, mild dilation of aortic root, hypertension, OSA, obesity, seizure, aortic atherosclerosis, COPD, hypertension, HLD, RBBB, QT prolongation, prior bedbug infestation who presented back to the hospital with weakness. Multiple admissions recently: - 07/6254 with a/c systolic CHF/AF RVR after non-adherence with follow-up/medications -05/8935 with a/c systolic CH/AF RVR after nonadherence with medications (pt reported compliance though call to pharmacy indicated patient had not picked up several meds), bed bug infestation, caring for wife with dementia and bilateral hip fx - outpatient plan for CRT-D noted with EP due to difficult to control HRs with plan to revisit 09/2021 - this admission with generalized weakness, AF RVR HR 120s-150s -> CTA + bilateral PE   Assessment & Plan    1. Permanent atrial fibrillation with RVR - historically difficult to control HR, prompting prior EP consideration of AVN ablation + CRT+/-D - HRs challenging this AM, were up to 160-170s just eating breakfast - now - received AM dose of metoprolol and now HR back to 100-1teens similar to recent baseline - digoxin stopped 06/03/21 due to level of 1.1 he was on 0.75m daily dosing - EP note 06/20/21 references continuation of 0.1235mbut not on med list at the time - per d/w Dr. SkMarlou Porchwe will resume at lower dose of 0.1257maily - ultimately would just benefit from proceeding with AVN ablation/device - Dr. SkaMarlou Porchoke with Dr. CamCurt Bearshey still anticipate outpatient f/u - historically on Coumadin rather than DOACs due to interaction with phenobarbital, INR followed by PCP -> hematology recommended transition to Lovenox but Dr. SkaMarlou Porchlt this was unlikely financially  feasible for the patient so back on Coumadin  2. Bilateral PE - occurred in context of subtherapeutic INR (pt reports subtherapeutic INR several weeks prior and Coumadin increased at that time, also noted during 05/2021 hospitalization), unfortunately DOACs not an option with seizure med interaction - LE venous duplex negative for DVT - see #1 re: anticoagulation  3. Acute on chronic HFrEF - has been getting IV Lasix; no weights 5/25-5/26, will order  - given patient feeling weak this AM with SBP 90s, hold further Lasix - GDMT otherwise limited by hypotension  4. AKI superimposed on possible CKD stage IIIa, with hypokalemia this morning - recent baseline appears 1.1-1.3, peak 1.73 this admission, downtrended to 1.58 - IM repleting potassium - avoid additional nephrotoxic meds  5. Mild MR, mild AI, moderate-severe TR, mild dilation of aortic root - continue clinical management of the above  6. Seizure disorder - has not had as seizure in 20+ years - management per primary team  7. Aortic atherosclerosis, hyperlipidemia - last LDL 04/2021 73 but not on meds at that time, check lipids in AM - continue pravastatin  For questions or updates, please contact [facility] consult [email or web address]  for contact info under Cardiology/STEMI.  Signed, Charlie Pitter, PA-C 07/29/2021, 8:13 AM    Personally seen and examined. Agree with above.  Ultimately needs AV nodal ablation with BiV pacemaker.  This was the plan previously but he ended up with a bedbug infestation and was unable to move forward with procedure.  This is a procedure that is done as an outpatient. I think it is reasonable to restart digoxin at 0.125 mg once a day.  Previously on double that dose, 0.25 mg, his digoxin level was 1.2.  It was discontinued appropriately at that time.  I think he should be able to tolerate the lower dosing. Continue with metoprolol 100 mg twice a day. Unfortunately with phenobarbital he needs  to be on warfarin.  Apparently edoxaban is compatible with phenobarbital when discussing with cardiac pharmacist.  I wonder if this could be an option for him if necessary.  [name], MD

## 2021-07-29 NOTE — Progress Notes (Signed)
ANTICOAGULATION CONSULT NOTE -  Pharmacy Consult for heparin Indication: pulmonary embolus  Allergies  Allergen Reactions   Dilaudid [Hydromorphone Hcl] Other (See Comments)    Makes pt hyper    Hydromorphone Other (See Comments)    hyperactiviity Other reaction(s): made him wild Other reaction(s): Unknown   Morphine Sulfate     Other reaction(s): at high dose causes confusion   Tegretol [Carbamazepine] Hives   Carbamazepine Hives and Rash    Other reaction(s): hives Other reaction(s): Hives    Patient Measurements: Height: _0  (172.7 cm) Weight: 104 kg (229 lb 4.8 oz) IBW/kg (Calculated) : 68.4 Heparin Dosing Weight: 91kg  Vital Signs: Temp: 98.7 F (37.1 C) (05/26 0900) Temp Source: Oral (05/26 0600) BP: 103/70 (05/26 0900) Pulse Rate: 104 (05/26 0900)  Labs: Recent Labs    07/26/21 2254 07/27/21 0205 07/27/21 1231 07/27/21 2257 07/28/21 0315 07/29/21 0321  HGB  --  12.4* 12.2*  --  12.5* 12.8*  HCT  --  38.6* 37.2*  --  39.1 38.4*  PLT  --  134* 129*  --  144* 154  LABPROT  --  36.6* 29.2*  --  29.1* 23.9*  INR  --  3.7* 2.8*  --  2.8* 2.2*  HEPARINUNFRC  --   --  0.17* 0.59 0.71*  --   CREATININE  --  1.70* 1.48*  --  1.52* 1.58*  TROPONINIHS 7 7  --   --   --   --      Estimated Creatinine Clearance: 45 mL/min (A) (by C-G formula based on SCr of 1.58 mg/dL (H)).   Medical History: Past Medical History:  Diagnosis Date   Acquired thrombophilia ([place]) 01/15/2019   Aortic atherosclerosis ([place])    Aortic root dilatation ([facility])    CAP (community acquired pneumonia) 03/06/2016   Chronic laryngitis 00/37/0488   Chronic systolic CHF (congestive heart failure) ([place])    COPD GOLD II  08/02/2016   Quit smoking 2000  PFT's  07/10/2016  FEV1 1.98 (70 % ) ratio 67  p 19 % improvement from saba p nothing  prior to study while of coreg so rec as of 08/02/2016 try off coreg and on bisoprolol      Essential hypertension 03/06/2016   Changed from coreg to bisoprolol  due to copd with reversible component  08/02/2016 >>>    Hoarseness 08/31/2016   Referred to ent 08/31/2016 >>> seen 10/03/16 [name] dx gerd and rhinitis medicamentosa   > improved on f/u 01/31/17 on gerd rx/ flonase and off afrin   Hyperlipidemia    Hypertension    Laryngopharyngeal reflux (LPR) 10/03/2016   Mitral regurgitation    Moderate persistent asthma 08/02/2016   Morbid (severe) obesity due to excess calories ([place]) 08/02/2016   Nasal polyps 10/03/2016   NICM (nonischemic cardiomyopathy) ([place])    a. EF 40-45% in 04/2016, etiology not defined, managed medically.   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    He is on  phenobarbital for history of seizures. This medication can reduce efficacy of DOACs and most sources recommend avoiding DOACs in patients on phenobarbitital -cost of enoxaparin: $99 per month  Warfarin restarted 5/25 -INR= 2.2  Home regimen: 17m/day except take 2.5101mon Mon  Goal of Therapy:  INR goal: 2-3 Heparin level 0.3-0.7 units/ml Monitor platelets by anticoagulation protocol: Yes   Plan:  -Warfarin 23m23mo today -Daily PT/INR  AndHildred LaserharmD Clinical Pharmacist **Pharmacist phone directory can now be found on [email or web address] ([email or web address]).  Listed under [facility]

## 2021-07-29 NOTE — Care Management (Signed)
  Transition of Care [facility]) Screening Note   Patient Details  Name: Ruben Murray Date of Birth: [birth year]   [facility]) CM/SW Contact:    Bethena Roys, RN Phone Number: 07/29/2021, 11:39 AM    Transition of Care Department [facility]) has reviewed the patient and no TOC needs have been identified at this time. We will continue to monitor patient advancement through interdisciplinary progression rounds. If new patient transition needs arise, please place a TOC consult.

## 2021-07-30 DIAGNOSIS — Z7189 Other specified counseling: Secondary | ICD-10-CM

## 2021-07-30 DIAGNOSIS — I5023 Acute on chronic systolic (congestive) heart failure: Secondary | ICD-10-CM | POA: Diagnosis not present

## 2021-07-30 DIAGNOSIS — Z515 Encounter for palliative care: Secondary | ICD-10-CM

## 2021-07-30 DIAGNOSIS — I2699 Other pulmonary embolism without acute cor pulmonale: Secondary | ICD-10-CM | POA: Diagnosis not present

## 2021-07-30 DIAGNOSIS — I4891 Unspecified atrial fibrillation: Secondary | ICD-10-CM | POA: Diagnosis not present

## 2021-07-30 LAB — BASIC METABOLIC PANEL
Anion gap: 9 (ref 5–15)
BUN: 26 mg/dL — ABNORMAL HIGH (ref 8–23)
CO2: 29 mmol/L (ref 22–32)
Calcium: 8.5 mg/dL — ABNORMAL LOW (ref 8.9–10.3)
Chloride: 95 mmol/L — ABNORMAL LOW (ref 98–111)
Creatinine, Ser: 1.59 mg/dL — ABNORMAL HIGH (ref 0.61–1.24)
GFR, Estimated: 44 mL/min — ABNORMAL LOW (ref >60)
Glucose, Bld: 107 mg/dL — ABNORMAL HIGH (ref 70–99)
Potassium: 4.1 mmol/L (ref 3.5–5.1)
Sodium: 133 mmol/L — ABNORMAL LOW (ref 135–145)

## 2021-07-30 LAB — CBC
HCT: 38.1 % — ABNORMAL LOW (ref 39.0–52.0)
Hemoglobin: 12.3 g/dL — ABNORMAL LOW (ref 13.0–17.0)
MCH: 29.8 pg (ref 26.0–34.0)
MCHC: 32.3 g/dL (ref 30.0–36.0)
MCV: 92.3 fL (ref 80.0–100.0)
Platelets: 164 10*3/uL (ref 150–400)
RBC: 4.13 MIL/uL — ABNORMAL LOW (ref 4.22–5.81)
RDW: 17.1 % — ABNORMAL HIGH (ref 11.5–15.5)
WBC: 8.5 10*3/uL (ref 4.0–10.5)
nRBC: 0 % (ref 0.0–0.2)

## 2021-07-30 LAB — BETA-2-GLYCOPROTEIN I ABS, IGG/M/A
Beta-2 Glyco I IgG: <9 GPI IgG units (ref 0–20)
Beta-2-Glycoprotein I IgA: <9 GPI IgA units (ref 0–25)
Beta-2-Glycoprotein I IgM: 16 GPI IgM units (ref 0–32)

## 2021-07-30 LAB — LIPID PANEL
Cholesterol: 110 mg/dL (ref 0–200)
HDL: 38 mg/dL — ABNORMAL LOW (ref >40)
LDL Cholesterol: 59 mg/dL (ref 0–99)
Total CHOL/HDL Ratio: 2.9 RATIO
Triglycerides: 67 mg/dL (ref <150)
VLDL: 13 mg/dL (ref 0–40)

## 2021-07-30 LAB — PROTIME-INR
INR: 2.3 — ABNORMAL HIGH (ref 0.8–1.2)
Prothrombin Time: 25.5 seconds — ABNORMAL HIGH (ref 11.4–15.2)

## 2021-07-30 MED ORDER — WARFARIN SODIUM 5 MG PO TABS
5.0000 mg | ORAL_TABLET | Freq: Once | ORAL | Status: AC
  Administered 2021-07-30: 5 mg via ORAL
  Filled 2021-07-30: qty 1

## 2021-07-30 MED ORDER — AMIODARONE IV BOLUS ONLY 150 MG/100ML
150.0000 mg | Freq: Once | INTRAVENOUS | Status: AC
  Administered 2021-07-30: 150 mg via INTRAVENOUS
  Filled 2021-07-30: qty 100

## 2021-07-30 MED ORDER — TORSEMIDE 20 MG PO TABS
60.0000 mg | ORAL_TABLET | Freq: Every day | ORAL | Status: DC
  Administered 2021-07-30 – 2021-08-01 (×3): 60 mg via ORAL
  Filled 2021-07-30 (×3): qty 3

## 2021-07-30 MED ORDER — DIGOXIN 125 MCG PO TABS
0.1250 mg | ORAL_TABLET | Freq: Once | ORAL | Status: AC
  Administered 2021-07-30: 0.125 mg via ORAL
  Filled 2021-07-30: qty 1

## 2021-07-30 NOTE — Progress Notes (Signed)
Patient still having episodes of RVR with minimal exertion. Up to 140's-160's while eating breakfast, moving in bed, or using the bathroom. Patient still feeling weak and worn out. BP's soft in 90's. Will continue to monitor.

## 2021-07-30 NOTE — Progress Notes (Signed)
Patient HR going up to 170's and 180's while sitting in bed with minimal exertion. When completely still, still sustaining afib rvr in 140's-150's. Patient still complaining of weakness and dizziness. Paged Humphrey Rolls on call with cardiology.   Order received for IV amiodarone. Checked with pharmacist that it is compatible with phenobarbitol and pts meds. Will monitor.

## 2021-07-30 NOTE — Progress Notes (Signed)
PROGRESS NOTE    Ruben Murray  PPJ:[id number] DOB: May 20, 1945 DOA: 07/26/2021  PCP: [name], MD   Brief Narrative: This 76 year old male with PMH significant of chronic atrial fibrillation (on coumadin), hypertension, hyperlipidemia, mild pulmonary hypertension, non-ischemic cardiomyopathy (normal cath 07/8097) with systolic congestive heart failure (Echo 04/2021 EF 20-25%), pulmonary hypertension, obstructive sleep apnea on CPAP, obesity, COPD who presentd in the ED with complaints of generalized weakness.  EMS was called due to progressively worsening symptoms.  Patient was found to have A-fib with RVR with heart rate in 150s. Upon evaluation in the ED found to have INR 3.8.  CTA chest shows small segmental subsegmental pulmonary emboli with right heart strain.  Patient is started on heparin gtt.  Hematology is consulted for anticoagulation, cardiology is consulted for A-fib with RVR. Given his hx of seizures , on phenobarbitol, DOAC is not an option, resumed on coumadin.  Assessment & Plan:   Principal Problem:   Acute pulmonary embolism (HCC) Active Problems:   Atrial fibrillation with rapid ventricular response (HCC)   Acute on chronic systolic CHF (congestive heart failure) (HCC)   AKI (acute kidney injury) ([place])   Essential hypertension   COPD (chronic obstructive pulmonary disease) (HCC)   Mixed hyperlipidemia   OSA on CPAP  Acute pulmonary embolism: Patient presented with several weeks history of generalized weakness. He is found to have bilateral pulmonary emboli on CT chest while being on Coumadin. Patient reports compliance with this medication.   Patient does report several weeks ago his INR was subtherapeutic requiring his PCP to increase his dose of Coumadin. Patient is started on IV heparin. Hematology consulted to determine if anticoagulant can be switched to different one.  Continue supplemental oxygen as needed. Since patient is on phenobarbital given history  of seizures, DOAC is not an option.  Patient is started back on Coumadin.  Atrial fibrillation with RVR: Patient presented with A-fib with RVR.Heart rate controlled after giving 5 mg IV metoprolol.   Heart rate continued to remain elevated in the setting of CHF and acute PE.   Continue metoprolol 100 mg twice daily. Cardiology suggested to resume Coumadin. Patient has an episode of A-fib with RVR while having breakfast today.   Cardiology has added digoxin low-dose. Continue to [place]. Patient still has episodes of A. Fib with RVR .  AKI on CKD stage IIIb: Serum creatinine last month 1.3, presented with creatinine 1.73 Could be multifactorial.  Patient appears volume overloaded at this time. Avoid nephrotoxic medications, renal functions improving.  Acute on chronic systolic CHF: On exam patient has bibasilar crackles, pitting edema, elevated JVD. Patient was given Lasix, felt much improved. Continued on Lasix 60 mg iv twice daily, transitioned to torsemide 60 mg daily. Monitor daily intake output charting.  2.7 net negative balance. Echocardiogram this admission shows LVEF 20%. Continue metoprolol 100 mg twice daily, consider starting losartan if blood pressure and creatinine improves.  Essential hypertension: Continue metoprolol, torsemide.  COPD: No evidence of exacerbation at this time.   Continue home bronchodilators.  Hyperlipidemia: Continue Pravastatin  OSA on CPAP: Continue CPAP  History of seizures.: Patient has developed PE while being on Coumadin. Unable to use DOAC with phenobarbital.    DVT prophylaxis: Coumadin Code Status: Full code Family Communication: No family at bedside Disposition Plan:  Status is: Inpatient Remains inpatient appropriate because: Admitted for acute PE while being on Coumadin.  Acute on chronic CHF exacerbation, AKI on CKD stage IIIb. A.fib with RVR . Patient is improving.  Anticipated discharge home in 1 to 2 days.     Consultants:  Cardiology  Procedures: Echocardiogram Antimicrobials: None   subjective: Patient was seen and examined at bedside.  Overnight events noted.   Patient had an episode of A-fib with RVR while having breakfast in the morning.   Heart rate went up to 160s and was given morning metoprolol,  heart rate came down to 115. Patient reports feeling better.  Objective: Vitals:   07/30/21 0831 07/30/21 0842 07/30/21 0937 07/30/21 1100  BP:   98/69 111/72  Pulse: (!) 123 (!) 105 (!) 122   Resp:  18  18  Temp:  (!) 97.5 F (36.4 C)    TempSrc:  Oral    SpO2:    97%  Weight:      Height:        Intake/Output Summary (Last 24 hours) at 07/30/2021 1211 Last data filed at 07/30/2021 1100 Gross per 24 hour  Intake 600 ml  Output 1525 ml  Net -925 ml   Filed Weights   07/27/21 0037 07/29/21 1017 07/30/21 0615  Weight: 105.3 kg 104 kg 104.5 kg    Examination:  General exam: Appears comfortable, not in any acute distress.  Deconditioned Respiratory system: CTA bilaterally, respiratory effort normal, RR 15 Cardiovascular system: S1 & S2 heard, regular rate and rhythm, no murmur. Gastrointestinal system: Abdomen is soft, non tender, non distended, BS+ Central nervous system: Alert and oriented X 3 . No focal neurological deficits. Extremities: Edema+, no clubbing, no cyanosis. Skin: No rashes, lesions or ulcers Psychiatry: Judgement and insight appear normal. Mood & affect appropriate.     Data Reviewed: I have personally reviewed following labs and imaging studies  CBC: Recent Labs  Lab 07/27/21 0205 07/27/21 1231 07/28/21 0315 07/29/21 0321 07/30/21 0256  WBC 8.5 7.2 8.8 8.1 8.5  NEUTROABS 5.2  --   --   --   --   HGB 12.4* 12.2* 12.5* 12.8* 12.3*  HCT 38.6* 37.2* 39.1 38.4* 38.1*  MCV 92.6 92.1 92.2 90.4 92.3  PLT 134* 129* 144* 154 149   Basic Metabolic Panel: Recent Labs  Lab 07/26/21 1757 07/27/21 0205 07/27/21 1231 07/28/21 0315 07/29/21 0321  07/30/21 0256  NA 137 139 141 138 135 133*  K 3.9 4.2 3.2* 3.9 3.3* 4.1  CL 99 101 101 99 97* 95*  CO2 28 30 32 _0 GLUCOSE 123* 96 141* 100* 149* 107*  BUN 36* 30* 28* 26* 25* 26*  CREATININE 1.73* 1.70* 1.48* 1.52* 1.58* 1.59*  CALCIUM 8.6* 8.7* 8.5* 8.5* 8.3* 8.5*  MG 2.5* 2.4  --  2.4 2.3  --   PHOS  --   --   --  3.8 3.3  --    GFR: Estimated Creatinine Clearance: 44.8 mL/min (A) (by C-G formula based on SCr of 1.59 mg/dL (H)). Liver Function Tests: Recent Labs  Lab 07/27/21 0205 07/29/21 0321  AST 23 21  ALT 17 16  ALKPHOS 122 115  BILITOT 0.5 0.7  PROT 6.5 6.4*  ALBUMIN 3.4* 3.3*   No results for input(s): LIPASE, AMYLASE in the last 168 hours. No results for input(s): AMMONIA in the last 168 hours. Coagulation Profile: Recent Labs  Lab 07/27/21 0205 07/27/21 1231 07/28/21 0315 07/29/21 0321 07/30/21 0256  INR 3.7* 2.8* 2.8* 2.2* 2.3*   Cardiac Enzymes: No results for input(s): CKTOTAL, CKMB, CKMBINDEX, TROPONINI in the last 168 hours. BNP (last 3 results) No results for input(s): PROBNP in the  last 8760 hours. HbA1C: No results for input(s): HGBA1C in the last 72 hours. CBG: Recent Labs  Lab 07/29/21 0911  GLUCAP 167*   Lipid Profile: Recent Labs    07/30/21 0256  CHOL 110  HDL 38*  LDLCALC 59  TRIG 67  CHOLHDL 2.9   Thyroid Function Tests: No results for input(s): TSH, T4TOTAL, FREET4, T3FREE, THYROIDAB in the last 72 hours. Anemia Panel: No results for input(s): VITAMINB12, FOLATE, FERRITIN, TIBC, IRON, RETICCTPCT in the last 72 hours. Sepsis Labs: No results for input(s): PROCALCITON, LATICACIDVEN in the last 168 hours.  Recent Results (from the past 240 hour(s))  Resp Panel by RT-PCR (Flu A&B, Covid) Nasopharyngeal Swab     Status: None   Collection Time: 07/26/21 10:00 PM   Specimen: Nasopharyngeal Swab; Nasopharyngeal(NP) swabs in vial transport medium  Result Value Ref Range Status   SARS Coronavirus 2 by RT PCR NEGATIVE  NEGATIVE Final    Comment: (NOTE) SARS-CoV-2 target nucleic acids are NOT DETECTED.  The SARS-CoV-2 RNA is generally detectable in upper respiratory specimens during the acute phase of infection. The lowest concentration of SARS-CoV-2 viral copies this assay can detect is 138 copies/mL. A negative result does not preclude SARS-Cov-2 infection and should not be used as the sole basis for treatment or other patient management decisions. A negative result may occur with  improper specimen collection/handling, submission of specimen other than nasopharyngeal swab, presence of viral mutation(s) within the areas targeted by this assay, and inadequate number of viral copies(<138 copies/mL). A negative result must be combined with clinical observations, patient history, and epidemiological information. The expected result is Negative.  Fact Sheet for Patients:  EntrepreneurPulse.com.au  Fact Sheet for Healthcare Providers:  IncredibleEmployment.be  This test is no t yet approved or cleared by the [place] FDA and  has been authorized for detection and/or diagnosis of SARS-CoV-2 by FDA under an Emergency Use Authorization (EUA). This EUA will remain  in effect (meaning this test can be used) for the duration of the COVID-19 declaration under Section 564(b)(1) of the Act, 21 U.S.C.section 360bbb-3(b)(1), unless the authorization is terminated  or revoked sooner.       Influenza A by PCR NEGATIVE NEGATIVE Final   Influenza B by PCR NEGATIVE NEGATIVE Final    Comment: (NOTE) The Xpert Xpress SARS-CoV-2/FLU/RSV plus assay is intended as an aid in the diagnosis of influenza from Nasopharyngeal swab specimens and should not be used as a sole basis for treatment. Nasal washings and aspirates are unacceptable for Xpert Xpress SARS-CoV-2/FLU/RSV testing.  Fact Sheet for Patients: EntrepreneurPulse.com.au  Fact Sheet for Healthcare  Providers: IncredibleEmployment.be  This test is not yet approved or cleared by the [place] FDA and has been authorized for detection and/or diagnosis of SARS-CoV-2 by FDA under an Emergency Use Authorization (EUA). This EUA will remain in effect (meaning this test can be used) for the duration of the COVID-19 declaration under Section 564(b)(1) of the Act, 21 U.S.C. section 360bbb-3(b)(1), unless the authorization is terminated or revoked.  Performed at [facility], [place] [address] [place], [place] [address]     Radiology Studies: No results found.  Scheduled Meds:  digoxin  0.125 mg Oral Daily   loratadine  10 mg Oral Daily   metoprolol tartrate  100 mg Oral BID   multivitamin with minerals  1 tablet Oral Q1200   phenobarbital  97.2 mg Oral QAC supper   pravastatin  40 mg Oral QHS   torsemide  60  mg Oral Daily   warfarin  5 mg Oral ONCE-1600   Warfarin - Pharmacist Dosing Inpatient   Does not apply q1600   Continuous Infusions:   LOS: 4 days    Time spent: 35 mins   Joshiah Traynham, MD [facility]   If 7PM-7AM, please contact night-coverage

## 2021-07-30 NOTE — Progress Notes (Addendum)
Progress Note  Patient Name: Ruben Murray Date of Encounter: 07/30/2021  Primary Cardiologist: [name], MD  Subjective   BP remains soft - still issues with Afib and RVR. Lasix has been on hold due to soft bp - LVEF 20%. Still net negative 1,1L overnight. Creatinine stable at 1.59. INR 2.3.   Inpatient Medications    Scheduled Meds:  digoxin  0.125 mg Oral Daily   loratadine  10 mg Oral Daily   metoprolol tartrate  100 mg Oral BID   multivitamin with minerals  1 tablet Oral Q1200   phenobarbital  97.2 mg Oral QAC supper   pravastatin  40 mg Oral QHS   Warfarin - Pharmacist Dosing Inpatient   Does not apply q1600   Continuous Infusions:  PRN Meds: acetaminophen **OR** acetaminophen, albuterol, fluticasone, hydrALAZINE, polyethylene glycol   Vital Signs    Vitals:   07/30/21 0615 07/30/21 0831 07/30/21 0842 07/30/21 0937  BP: 103/75   98/69  Pulse: 95 (!) 123 (!) 105 (!) 122  Resp: 19  18   Temp: (!) 97.5 F (36.4 C)  (!) 97.5 F (36.4 C)   TempSrc: Oral  Oral   SpO2: 98%     Weight: 104.5 kg     Height:        Intake/Output Summary (Last 24 hours) at 07/30/2021 1018 Last data filed at 07/30/2021 0800 Gross per 24 hour  Intake 240 ml  Output 1300 ml  Net -1060 ml      07/30/2021    6:15 AM 07/29/2021   10:17 AM 07/27/2021   12:37 AM  Last 3 Weights  Weight (lbs) 230 lb 6.4 oz 229 lb 4.8 oz 232 lb 1.6 oz  Weight (kg) 104.509 kg 104.01 kg 105.28 kg     Telemetry    AF RVR - Personally Reviewed  Physical Exam   GEN: No acute distress.  HEENT: Normocephalic, atraumatic, sclera non-icteric. Neck: No JVD or bruits. Cardiac: Irregularly irregular, rate borderline up, no murmurs, rubs, or gallops.  Respiratory: Mildly diminished otherwise no significant crackles, wheezing or rhonchi. Breathing is unlabored. GI: Soft, nontender, non-distended, BS +x 4. MS: no deformity. Extremities: No clubbing or cyanosis. No edema. Distal pedal pulses are 2+ and  equal bilaterally. Neuro:  AAOx3. Follows commands. Psych:  Responds to questions appropriately with a normal affect.  Labs    High Sensitivity Troponin:   Recent Labs  Lab 07/26/21 2254 07/27/21 0205  TROPONINIHS 7 7      Cardiac EnzymesNo results for input(s): TROPONINI in the last 168 hours. No results for input(s): TROPIPOC in the last 168 hours.   Chemistry Recent Labs  Lab 07/27/21 0205 07/27/21 1231 07/28/21 0315 07/29/21 0321 07/30/21 0256  NA 139   < > 138 135 133*  K 4.2   < > 3.9 3.3* 4.1  CL 101   < > 99 97* 95*  CO2 30   < > _0 GLUCOSE 96   < > 100* 149* 107*  BUN 30*   < > 26* 25* 26*  CREATININE 1.70*   < > 1.52* 1.58* 1.59*  CALCIUM 8.7*   < > 8.5* 8.3* 8.5*  PROT 6.5  --   --  6.4*  --   ALBUMIN 3.4*  --   --  3.3*  --   AST 23  --   --  21  --   ALT 17  --   --  16  --  ALKPHOS 122  --   --  115  --   BILITOT 0.5  --   --  0.7  --   GFRNONAA 41*   < > 47* 44* 44*  ANIONGAP 8   < > _0 < > = values in this interval not displayed.     Hematology Recent Labs  Lab 07/28/21 0315 07/29/21 0321 07/30/21 0256  WBC 8.8 8.1 8.5  RBC 4.24 4.25 4.13*  HGB 12.5* 12.8* 12.3*  HCT 39.1 38.4* 38.1*  MCV 92.2 90.4 92.3  MCH 29.5 30.1 29.8  MCHC 32.0 33.3 32.3  RDW 17.0* 16.9* 17.1*  PLT 144* 154 164    BNP Recent Labs  Lab 07/26/21 1757  BNP 753.5*     DDimer No results for input(s): DDIMER in the last 168 hours.   Radiology    No results found.  Cardiac Studies   2D Echo 07/27/21 1. Left ventricular ejection fraction, by estimation, is 20%. The left  ventricle has severely decreased function. The left ventricle demonstrates  global hypokinesis. The left ventricular internal cavity size was mildly  dilated. Left ventricular  diastolic parameters are indeterminate.   2. Right ventricular systolic function is moderately reduced. The right  ventricular size is severely enlarged. There is normal pulmonary artery  systolic  pressure. The estimated right ventricular systolic pressure is  76.7 mmHg.   3. Left atrial size was mildly dilated.   4. Right atrial size was moderately dilated.   5. The mitral valve is normal in structure. Mild mitral valve  regurgitation. No evidence of mitral stenosis.   6. Tricuspid valve regurgitation is moderate to severe.   7. The aortic valve is tricuspid. There is mild calcification of the  aortic valve. Aortic valve regurgitation is mild. Aortic valve sclerosis  is present, with no evidence of aortic valve stenosis.   8. Aortic dilatation noted. There is mild dilatation of the aortic root,  measuring 37 mm.   9. The inferior vena cava is dilated in size with <50% respiratory  variability, suggesting right atrial pressure of 15 mmHg.  10. The patient was in atrial fibrillation.   Comparison(s): Left ventricular ejection fraction, by estimation, is 20 to  25%.   Patient Profile     76 y.o. male with chronic systolic CHF/NICM, permanent atrial fibrillation and prior atrial flutter, chronic anticoag with Coumadin (not on DOAC 2/2 interaction with seizure med), cath 2021 with normal coronaries, pulmonary HTN (felt combination WHO group 2 & 3), mild MR/AI, moderate-severe TR, mild dilation of aortic root, hypertension, OSA, obesity, seizure, aortic atherosclerosis, COPD, hypertension, HLD, RBBB, QT prolongation, prior bedbug infestation who presented back to the hospital with weakness. Multiple admissions recently: - 04/945 with a/c systolic CHF/AF RVR after non-adherence with follow-up/medications -0/9628 with a/c systolic CH/AF RVR after nonadherence with medications (pt reported compliance though call to pharmacy indicated patient had not picked up several meds), bed bug infestation, caring for wife with dementia and bilateral hip fx - outpatient plan for CRT-D noted with EP due to difficult to control HRs with plan to revisit 09/2021 - this admission with generalized weakness, AF  RVR HR 120s-150s -> CTA + bilateral PE   Assessment & Plan    1. Permanent atrial fibrillation with RVR - historically difficult to control HR, prompting prior EP consideration of AVN ablation + CRT+/-D - HRs challenging this AM, were up to 160-170s just eating breakfast - now - received AM dose of metoprolol  and now HR back to 100-1teens similar to recent baseline - digoxin stopped 06/03/21 due to level of 1.1 he was on 0.15m daily dosing - EP note 06/20/21 references continuation of 0.1211mbut not on med list at the time - per d/w Dr. SkMarlou Porchwe will resume at lower dose of 0.12540maily - ultimately would just benefit from proceeding with AVN ablation/device - Dr. SkaMarlou Porchoke with Dr. CamCurt Bearshey still anticipate outpatient f/u - historically on Coumadin rather than DOACs due to interaction with phenobarbital, INR followed by PCP -> hematology recommended transition to Lovenox but Dr. SkaMarlou Porchlt this was unlikely financially feasible for the patient so back on Coumadin  2. Bilateral PE - occurred in context of subtherapeutic INR (pt reports subtherapeutic INR several weeks prior and Coumadin increased at that time, also noted during 05/2021 hospitalization), unfortunately DOACs not an option with seizure med interaction - LE venous duplex negative for DVT - see #1 re: anticoagulation  3. Acute on chronic HFrEF - has been getting IV Lasix; no weights 5/25-5/26, will order  - would restart maintenance dose torsemide given low LVEF. - GDMT otherwise limited by hypotension  4. AKI superimposed on possible CKD stage IIIa, with hypokalemia this morning - recent baseline appears 1.1-1.3, peak 1.73 this admission, downtrended to 1.59 - potassium 4.1 today - avoid additional nephrotoxic meds  5. Mild MR, mild AI, moderate-severe TR, mild dilation of aortic root - continue clinical management of the above  6. Seizure disorder - has not had as seizure in 20+ years - management per primary  team  7. Aortic atherosclerosis, hyperlipidemia - last LDL 04/2021 73 but not on meds at that time, check lipids in AM - continue pravastatin   Probably close to d/c from our standpoint. No plans for pacemaker this admission.  For questions or updates, please contact CHMLa Haciendaease consult [email or web address] for contact info under Cardiology/STEMI.  KenPixie CasinoD, FACSan Antonio Gastroenterology Endoscopy Center NorthACBannockrector of the Advanced Lipid Disorders &  Cardiovascular Risk Reduction Clinic Diplomate of the American Board of Clinical Lipidology Attending Cardiologist  Direct Dial: [phone]: [phone]:  [email or web address]  KenPixie CasinoD 07/30/2021, 10:18 AM

## 2021-07-30 NOTE — Progress Notes (Signed)
ANTICOAGULATION CONSULT NOTE - Follow Up Consult  Pharmacy Consult for Warfarin Indication: pulmonary embolus  Allergies  Allergen Reactions   Dilaudid [Hydromorphone Hcl] Other (See Comments)    Makes pt hyper    Hydromorphone Other (See Comments)    hyperactiviity Other reaction(s): made him wild Other reaction(s): Unknown   Morphine Sulfate     Other reaction(s): at high dose causes confusion   Tegretol [Carbamazepine] Hives   Carbamazepine Hives and Rash    Other reaction(s): hives Other reaction(s): Hives    Patient Measurements: Height: 5' 8" (172.7 cm) Weight: 104.5 kg (230 lb 6.4 oz) IBW/kg (Calculated) : 68.4 kg  Vital Signs: Temp: 97.5 F (36.4 C) (05/27 0842) Temp Source: Oral (05/27 0842) BP: 111/72 (05/27 1100) Pulse Rate: 122 (05/27 0937)  Labs: Recent Labs    07/27/21 1231 07/27/21 2257 07/28/21 0315 07/29/21 0321 07/30/21 0256  HGB 12.2*  --  12.5* 12.8* 12.3*  HCT 37.2*  --  39.1 38.4* 38.1*  PLT 129*  --  144* 154 164  LABPROT 29.2*  --  29.1* 23.9* 25.5*  INR 2.8*  --  2.8* 2.2* 2.3*  HEPARINUNFRC 0.17* 0.59 0.71*  --   --   CREATININE 1.48*  --  1.52* 1.58* 1.59*    Estimated Creatinine Clearance: 44.8 mL/min (A) (by C-G formula based on SCr of 1.59 mg/dL (H)).   Medications:  Scheduled:   digoxin  0.125 mg Oral Daily   loratadine  10 mg Oral Daily   metoprolol tartrate  100 mg Oral BID   multivitamin with minerals  1 tablet Oral Q1200   phenobarbital  97.2 mg Oral QAC supper   pravastatin  40 mg Oral QHS   torsemide  60 mg Oral Daily   Warfarin - Pharmacist Dosing Inpatient   Does not apply q1600   Infusions:  PRN: acetaminophen **OR** acetaminophen, albuterol, fluticasone, hydrALAZINE, polyethylene glycol   Assessment: 76 yo male presents with atrial fibrillation and found to have bilateral pulmonary emboli. PTA the patient is on warfarin, INR 3.8 on admit. Pharmacy is consulted to dose warfarin.   PTA warfarin regimen: 5 mg  daily except 2.5 mg on Mondays   The patient is well controlled on phenobarbital for a history of seizures, which can reduce the efficacy of DOACs and most sources recommend avoiding DOACs in patients on phenobarbital. Enoxaparin has a cost of $99/month for the patient.   INR is therapeutic at 2.3 today. There are no documented signs or symptoms of bleeding. There have been no significant diet changes, and no new interacting medications have been initiated. Hgb 12.3, platelets 164.  Goal of Therapy:  INR 2-3 Monitor platelets by anticoagulation protocol: Yes   Plan:  Give warfarin PO 5 mg x 1 dose tonight Obtain a daily PT/INR and Q72H CBC Monitor for signs and symptoms of bleeding Watch for drug-drug interactions and significant diet changes   Shauna [name], PharmD, [place]   PGY-2 Pharmacy Resident  07/30/2021 11:21 AM   Please check [email or web address] for unit-specific pharmacy phone numbers.

## 2021-07-30 NOTE — Evaluation (Signed)
Physical Therapy Evaluation Patient Details Name: Ruben Murray MRN: [id number] DOB: 11-Mar-1945 Today's Date: 07/30/2021  History of Present Illness  Pt is a 76 y.o. male admitted 07/26/21 with c/o generalized weakness. Patient was found to have a-fib with RVR with heart rate in 150s. CTA chest shows small segmental pulmonary emboli with right heart strain.PMH includes systolic CHF, HTN, OSA, seizure, aortic atherosclerosis, COPD, hypertension, persistent atrial fibrillation.   Clinical Impression  Pt presents with an overall decrease in functional mobility secondary to above. PTA, pt mod indep ambulating limited household distances with SPC vs rollator, lives with wife who has dementia; pt reports neighbors help pt and wife with "everything." Today, pt tolerating short bout of ambulation with RW at supervision-level, but limited by c/o significant R foot pain with mobility. Pt would benefit from continued acute PT services to maximize functional mobility and independence prior to d/c with HHPT services. Based on pt and wife's increasing difficulties at home, would likely benefit from ALF in the long-term.  HR 140s-160s with ambulation, briefly up to 189 (unsustained) - unsure if pt symptomatic as he was focused on pain   Recommendations for follow up therapy are one component of a multi-disciplinary discharge planning process, led by the attending physician.  Recommendations may be updated based on patient status, additional functional criteria and insurance authorization.  Follow Up Recommendations Home health PT    Assistance Recommended at Discharge Intermittent Supervision/Assistance  Patient can return home with the following  A little help with bathing/dressing/bathroom;Assistance with cooking/housework;Assist for transportation;Help with stairs or ramp for entrance    Equipment Recommendations Rolling walker (2 wheels) - TBD  Recommendations for Other Services       Functional  Status Assessment Patient has had a recent decline in their functional status and demonstrates the ability to make significant improvements in function in a reasonable and predictable amount of time.     Precautions / Restrictions Precautions Precautions: Fall;Other (comment) Precaution Comments: Watch HR and BP Restrictions Weight Bearing Restrictions: No      Mobility  Bed Mobility Overal bed mobility: Modified Independent             General bed mobility comments: increased time and effort, requiring cues to complete task    Transfers Overall transfer level: Needs assistance Equipment used: Rollator (4 wheels), Rolling walker (2 wheels) Transfers: Sit to/from Stand Sit to Stand: Supervision           General transfer comment: prolonged time sitting EOB "preparing" to stand, repeated cues to complete task but pt continues to sit secondary to anticipation of L foot pain; ultimately supervision to stand to rollator and RW    Ambulation/Gait Ambulation/Gait assistance: Min guard, Supervision Gait Distance (Feet): 16 Feet Assistive device: Rollator (4 wheels), Rolling walker (2 wheels) Gait Pattern/deviations: Step-to pattern, Decreased weight shift to left, Trunk flexed, Antalgic Gait velocity: Decreased Gait velocity interpretation: <1.31 ft/sec, indicative of household ambulator   General Gait Details: pt taking initial couple steps with rollator, notable L foot pain therefore suggested pt switch to RW in order to offload painful L foot; pt ambulating ~16' with RW at supervision-level, pt taking >20-min to walk this distance secondary to pain; frequent cues for sequencing, including maintaining closer proximity to RW to allow better offloading of painful L foot, pt not incorporating feedback despite max cues, could not give reason why  Stairs            Wheelchair Mobility    Modified Rankin (Stroke  Patients Only)       Balance Overall balance assessment:  Needs assistance Sitting-balance support: No upper extremity supported, Feet supported Sitting balance-Leahy Scale: Fair     Standing balance support: Reliant on assistive device for balance Standing balance-Leahy Scale: Poor                               Pertinent Vitals/Pain Pain Assessment Pain Assessment: Faces Faces Pain Scale: Hurts whole lot Pain Location: L foot/toe (pt attributes to gout) Pain Descriptors / Indicators: Grimacing, Guarding, Moaning Pain Intervention(s): Monitored during session, Limited activity within patient's tolerance    Home Living Family/patient expects to be discharged to:: Private residence Living Arrangements: Spouse/significant other Available Help at Discharge: Family;Available PRN/intermittently Type of Home: Apartment Home Access: Level entry       Home Layout: One level Home Equipment: Tub bench;Cane - single point;Rollator (4 wheels) Additional Comments: wife has dementia pt has to assist wife with "everything"    Prior Function Prior Level of Function : Needs assist             Mobility Comments: ambulates with spc in the house, and rollator for outside the house, endorses fall prior to admission  "got weak and went down on my knees" had to call 911 to assist getting back up, denies any other near falls ADLs Comments: pt is the caregiver for his spouse, he reports his neighbors assist with "everything"     Hand Dominance   Dominant Hand: Right    Extremity/Trunk Assessment   Upper Extremity Assessment Upper Extremity Assessment: Generalized weakness    Lower Extremity Assessment Lower Extremity Assessment: Generalized weakness LLE Deficits / Details: functionally >3/5 strength throughout; mobility significantly limited by c/o L foot "gout" pain    Cervical / Trunk Assessment Cervical / Trunk Assessment: Normal  Communication   Communication: No difficulties  Cognition Arousal/Alertness:  Awake/alert Behavior During Therapy: WFL for tasks assessed/performed Overall Cognitive Status: No family/caregiver present to determine baseline cognitive functioning                                 General Comments: WFL for simple tasks; question slowed processing and decreased attention based on pt's apparent difficulty with multitasking and incorporating cues during gait training; likely near baseline cognition        General Comments General comments (skin integrity, edema, etc.): HR up to 166 with bed mobility, 140s-160s with ambulation with brief episode up to 189 - difficult to determine if symptomatic as pt extremely focused on L foot pain; RN present and aware    Exercises     Assessment/Plan    PT Assessment Patient needs continued PT services  PT Problem List Decreased strength;Decreased activity tolerance;Decreased balance;Decreased mobility;Cardiopulmonary status limiting activity;Pain       PT Treatment Interventions DME instruction;Gait training;Stair training;Functional mobility training;Therapeutic activities;Therapeutic exercise;Balance training;Patient/family education    PT Goals (Current goals can be found in the Care Plan section)  Acute Rehab PT Goals Patient Stated Goal: return home, decreased pain PT Goal Formulation: With patient Time For Goal Achievement: 08/13/21 Potential to Achieve Goals: Fair    Frequency Min 3X/week     Co-evaluation               AM-PAC PT "6 Clicks" Mobility  Outcome Measure Help needed turning from your back to your side while in a  flat bed without using bedrails?: None Help needed moving from lying on your back to sitting on the side of a flat bed without using bedrails?: A Little Help needed moving to and from a bed to a chair (including a wheelchair)?: A Little Help needed standing up from a chair using your arms (e.g., wheelchair or bedside chair)?: A Little Help needed to walk in hospital room?:  Total Help needed climbing 3-5 steps with a railing? : Total 6 Click Score: 15    End of Session Equipment Utilized During Treatment: Gait belt Activity Tolerance: Patient limited by pain Patient left: in bed;with call bell/phone within reach;with bed alarm set;with nursing/sitter in room Nurse Communication: Mobility status PT Visit Diagnosis: Other abnormalities of gait and mobility (R26.89);Pain Pain - Right/Left: Left Pain - part of body: Ankle and joints of foot    Time: 7903-8333 PT Time Calculation (min) (ACUTE ONLY): 39 min   Charges:   PT Evaluation $PT Eval Moderate Complexity: 1 Mod PT Treatments $Gait Training: 8-22 mins       Mabeline Caras, PT, DPT Acute Rehabilitation Services  Pager [phone] Office [place] 07/30/2021, 4:50 PM

## 2021-07-30 NOTE — Evaluation (Signed)
Occupational Therapy Evaluation Patient Details Name: Ruben Murray MRN: [id number] DOB: [birth year] Today's Date: 07/30/2021   History of Present Illness 76 y.o. male admitted 5/23 with c/o generalized weakness. Patient was found to have a-fib with RVR with heart rate in 150s. INR 3.8. CTA chest shows small segmental subsemental pulmonary emboli with right hreart strain. Marland Kitchen  PMH: systolic CHF, hypertension, OSA, seizure, aortic atherosclerosis, COPD, hypertension, persistent atrial fibrillation   Clinical Impression   PTA, pt reports he was the caregiver for his wife who has dementia. Pt reports his neighbors assist with "everything" regarding ADL. Upon OT arrival pt in bed with increased wob and HR 127bpm, pt reports he just returned from the bathroom. Pt declined additional OOB mobility during this session. He demonstrated ability to complete bed mobility at modified independent level. During bed mobility HR briefly 143bpm. Pt repositioned comfortably in bed. He required modA for LB dressing and endorsed L foot pain with MMT. Will continue to follow acutely and progress appropriately.       Recommendations for follow up therapy are one component of a multi-disciplinary discharge planning process, led by the attending physician.  Recommendations may be updated based on patient status, additional functional criteria and insurance authorization.   Follow Up Recommendations  Home health OT    Assistance Recommended at Discharge    Patient can return home with the following A little help with walking and/or transfers;A little help with bathing/dressing/bathroom    Functional Status Assessment  Patient has had a recent decline in their functional status and demonstrates the ability to make significant improvements in function in a reasonable and predictable amount of time.  Equipment Recommendations       Recommendations for Other Services       Precautions / Restrictions  Precautions Precautions: Fall Precaution Comments: watch HR      Mobility Bed Mobility Overal bed mobility: Modified Independent             General bed mobility comments: heavy reliance on bed rails, hob elevated and increased time and effort.    Transfers                   General transfer comment: pt declined OOB mobility at this time      Balance Overall balance assessment: Needs assistance Sitting-balance support: Single extremity supported, Feet supported Sitting balance-Leahy Scale: Fair Sitting balance - Comments: fair sitting balance with at least single UE support       Standing balance comment: pt declined OOB mobility this session                           ADL either performed or assessed with clinical judgement   ADL Overall ADL's : Needs assistance/impaired Eating/Feeding: Set up;Sitting   Grooming: Set up;Sitting Grooming Details (indicate cue type and reason): limited by tachy HR Upper Body Bathing: Set up;Sitting   Lower Body Bathing: Set up;Sitting/lateral leans   Upper Body Dressing : Set up;Sitting   Lower Body Dressing: Moderate assistance;Sitting/lateral leans                 General ADL Comments: pt declined OOB mobility this session secondary to just returning from bathroom and pt with increase wob, HR 124bpm-upper 130s with briefly 143bpm with attempts to adjust in bed. pt declined OOB this session     Vision         Perception     Praxis  Pertinent Vitals/Pain Pain Assessment Pain Assessment: 0-10 Pain Score: 5  Pain Location: L ankle reports feeling gout coming on Pain Descriptors / Indicators: Sore, Grimacing, Guarding Pain Intervention(s): Limited activity within patient's tolerance, Monitored during session     Hand Dominance Right   Extremity/Trunk Assessment Upper Extremity Assessment Upper Extremity Assessment: Overall WFL for tasks assessed   Lower Extremity Assessment Lower  Extremity Assessment: Defer to PT evaluation;LLE deficits/detail LLE Deficits / Details: endorses increased pain with attempts to evaluated MMT plantarflexion/dorsiflexion, pt stated "i think im getting gout" endorses pain around ankle joint   Cervical / Trunk Assessment Cervical / Trunk Assessment: Normal   Communication Communication Communication: No difficulties   Cognition Arousal/Alertness: Awake/alert Behavior During Therapy: WFL for tasks assessed/performed Overall Cognitive Status: Within Functional Limits for tasks assessed                                       General Comments  HR ranging from 110bpm to briefly 143bpm with adjusting in bed;SpO2 98% RA    Exercises     Shoulder Instructions      Home Living Family/patient expects to be discharged to:: Private residence Living Arrangements: Spouse/significant other Available Help at Discharge: Family;Available PRN/intermittently Type of Home: Apartment Home Access: Level entry     Home Layout: One level     Bathroom Shower/Tub: Teacher, early years/pre: Standard     Home Equipment: Tub bench;Cane - single point;Rollator (4 wheels)   Additional Comments: wife has dementia pt has to assist wife with "everything"      Prior Functioning/Environment Prior Level of Function : Needs assist             Mobility Comments: ambulates with spc in the house, and rollator for outside the house, endorses fall prior to admission  "got weak and went down on my knees" had to call 911 to assist getting back up, denies any other near falls ADLs Comments: pt is the caregiver for his spouse, he reports his neighbors assist with "everything"        OT Problem List: Decreased activity tolerance;Impaired balance (sitting and/or standing);Cardiopulmonary status limiting activity      OT Treatment/Interventions: Self-care/ADL training;Therapeutic exercise;Energy conservation;DME and/or AE  instruction;Patient/family education;Balance training    OT Goals(Current goals can be found in the care plan section) Acute Rehab OT Goals Patient Stated Goal: to rest OT Goal Formulation: With patient Time For Goal Achievement: 08/13/21 Potential to Achieve Goals: Good ADL Goals Pt Will Perform Grooming: with modified independence;standing Pt Will Perform Lower Body Dressing: with modified independence;sit to/from stand Pt Will Transfer to Toilet: with modified independence;ambulating Additional ADL Goal #1: Pt will demonstrate independence with 3 energy conservation strategies during ADL/IADL and functional mobility.  OT Frequency: Min 2X/week    Co-evaluation              AM-PAC OT "6 Clicks" Daily Activity     Outcome Measure Help from another person eating meals?: A Little Help from another person taking care of personal grooming?: A Little Help from another person toileting, which includes using toliet, bedpan, or urinal?: A Little Help from another person bathing (including washing, rinsing, drying)?: A Little Help from another person to put on and taking off regular upper body clothing?: A Little Help from another person to put on and taking off regular lower body clothing?: A Little 6 Click Score:  18   End of Session Nurse Communication: Mobility status  Activity Tolerance: Patient limited by fatigue Patient left: in bed;with call bell/phone within reach;with bed alarm set  OT Visit Diagnosis: Other abnormalities of gait and mobility (R26.89)                Time: 5825-1898 OT Time Calculation (min): 14 min Charges:  OT General Charges $OT Visit: 1 Visit OT Evaluation $OT Eval Moderate Complexity: [place] OTR/L Acute Rehabilitation Services Office: [place] 07/30/2021, 2:43 PM

## 2021-07-30 NOTE — Consult Note (Signed)
Palliative Medicine Inpatient Consult Note  Consulting Provider: Dr. Dwyane Dee  Reason for consult:   [place] Palliative Medicine Consult  Reason for Consult? discuss code status    07/30/2021  HPI:  Per intake H&P --> 76 year old male with past medical history of chronic atrial fibrillation (on coumadin), hypertension, hyperlipidemia, mild pulmonary hypertension, nonischemic cardiomyopathy (normal cath 05/2949) with systolic congestive heart failure (Echo 04/2021 EF 20-25%), pulmonary hypertension, obstructive sleep apnea on CPAP, obesity, COPD who presents to [facility] emergency department via EMS with complaints of generalized weakness.  Palliative care has been asked to get involved to further review code status.   Clinical Assessment/Goals of Care:  *Please note that this is a verbal dictation therefore any spelling or grammatical errors are due to the "[employer] One" system interpretation.  I have reviewed medical records including EPIC notes, labs and imaging, received report from bedside RN, assessed the patient.    I met with Alba Cory to further discuss diagnosis prognosis, GOC, EOL wishes, disposition and options.   I introduced Palliative Medicine as specialized medical care for people living with serious illness. It focuses on providing relief from the symptoms and stress of a serious illness. The goal is to improve quality of life for both the patient and the family.  Medical History Review and Understanding:  Discussed Dailey history of cardiomyopathy, HTN, Afib, seizures, and COPD. Reviewed the progressive nature of his disease processes.   Social History:  Mancil lives in [place], [place].  He has been married twice his first marriage for 29 years and his second marriage which she is still in to Prentice for 28 years.  He has a daughter from his first marriage who lives in an apartment in [place].  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  He used to sing in a Warden/ranger and shares that this is his first love he was on the road traveling for over 30 years though stopped in 1991.  Khori also is a former [place] veteran.  He is a man of extreme faith and practices within the [facility] denomination.  Functional and Nutritional State:  Niko lives in an apartment with his wife Mechele Claude who suffers from dementia.  [place] helps her with day-to-day activities and also relies heavily on his neighbors [place] to organize his medications.  Trini for the most part is able to do BADLs independently.  Prior to admission he had a good appetite.  Advance Directives:  A detailed discussion was had today regarding advanced directives.  Rickard shares that he does have advanced directives which enlist his niece, Lucina Mellow is a Air traffic controller.  Code Status Discussion:  Encouraged patient/family to consider DNR/DNI status understanding evidenced based poor outcomes in similar hospitalized patient, as the cause of arrest is likely associated with advanced chronic/terminal illness rather than an easily reversible acute cardio-pulmonary event. I explained that DNR/DNI does not change the medical plan and it only comes into effect after a person has arrested (died).  It is a protective measure to keep [place] from harming the patient in their last moments of life. Jovann was agreeable to DNR/DNI with understanding that patient would not receive CPR, defibrillation, ACLS medications, or intubation.   Concepts specific to code status, artifical feeding and hydration, continued IV antibiotics and rehospitalization was had.  The difference between a aggressive medical intervention path  and a palliative comfort care path for this patient at  this time was had.  A MOST form was introduced and completed as below:  Cardiopulmonary Resuscitation: Do Not Attempt Resuscitation (DNR/No CPR)   Medical Interventions: Limited Additional Interventions: Use medical treatment, IV fluids and cardiac monitoring as indicated, DO NOT USE intubation or mechanical ventilation. May consider use of less invasive airway support such as BiPAP or CPAP. Also provide comfort measures. Transfer to the hospital if indicated. Avoid intensive care.   Antibiotics: Antibiotics if indicated  IV Fluids: IV fluids if indicated  Feeding Tube: Feeding tube for a defined trial period   Clarification was provided again that patient can come to and from the hospital and get all interventions with the exception of cardiopulmonary resuscitation and intubation.  Aric was under the misconception that being a DNR would not inhibit him from going to and from the hospital.  We were able to clarify these areas of misunderstanding.  Discussed the importance of continued conversation with family and their  medical providers regarding overall plan of care and treatment options, ensuring decisions are within the context of the patients values and GOCs.  Decision Maker: Sizemore,Rhonda Niece     [phone]     SUMMARY OF RECOMMENDATIONS   DNAR/DNI  MOST form completed and placed in vincula  Treat what is treatable  Outpatient palliative care support on discharge  Palliative care will continue to follow for the time being  Code Status/Advance Care Planning: DNAR/DNI  Palliative Prophylaxis:  Aspiration, Bowel Regimen, Delirium Protocol, Frequent Pain Assessment, Oral Care, Palliative Wound Care, and Turn Reposition  Additional Recommendations (Limitations, Scope, Preferences): Continue to treat the treatable  Psycho-social/Spiritual:  Desire for further Chaplaincy support: No Additional Recommendations: Education on chronic disease burden and cardiopulmonary resuscitation status   Prognosis: Multiple chronic comorbid conditions, 3 admissions in the past 6 months --> increased 7-monthmortality  risk  Discharge Planning: Discharge home once medically optimized per primary team this could happen as early as tomorrow.  Vitals:   07/30/21 0937 07/30/21 1100  BP: 98/69 111/72  Pulse: (!) 122   Resp:  18  Temp:    SpO2:  97%    Intake/Output Summary (Last 24 hours) at 07/30/2021 1228 Last data filed at 07/30/2021 1100 Gross per 24 hour  Intake 600 ml  Output 1525 ml  Net -925 ml   Last Weight  Most recent update: 07/30/2021  6:35 AM    Weight  104.5 kg (230 lb 6.4 oz)            Gen: Elderly Caucasian man in no acute distress HEENT: moist mucous membranes CV: Regular rate and irregular rhythm PULM: On room air breathing is even and nonlabored ABD: soft/nontender EXT: No edema Neuro: Alert and oriented x3  PPS: 60%   This conversation/these recommendations were discussed with patient primary care team, Dr. KDwyane Dee Billing based on MDM: High  Problems Addressed: One acute or chronic illness or injury that poses a threat to life or bodily function  Amount and/or Complexity of Data: Category 3:Discussion of management or test interpretation with external physician/other qualified health care professional/appropriate source (not separately reported)  Risks: Decision not to resuscitate or to de-escalate care because of poor prognosis ______________________________________________________ [facility] Team Cell Phone: [phone] utilize secure chat with additional questions, if there is no response within 30 minutes please call the above phone number  Palliative Medicine Team providers are available by phone from 7am to 7pm daily and can be reached through the  team cell phone.  Should this patient require assistance outside of these hours, please call the patient's attending physician.

## 2021-07-30 NOTE — Progress Notes (Signed)
Patient heart rate up to 170-180 when ambulated with PT, patient back to bed and resting. Felt dizzy and lightheaded and short of breath. O2 dropped to 87% and BP to 90's. Oxygen came up with 2L oxygen to 100%. Patient heart rate now sustaining in 140's. Cardiology PA notified, awaiting new orders. Patient asleep, at bedside, will continue to monitor.

## 2021-07-31 ENCOUNTER — Other Ambulatory Visit: Payer: Self-pay

## 2021-07-31 DIAGNOSIS — I2699 Other pulmonary embolism without acute cor pulmonale: Secondary | ICD-10-CM | POA: Diagnosis not present

## 2021-07-31 DIAGNOSIS — Z515 Encounter for palliative care: Secondary | ICD-10-CM | POA: Diagnosis not present

## 2021-07-31 LAB — BASIC METABOLIC PANEL
Anion gap: 9 (ref 5–15)
BUN: 24 mg/dL — ABNORMAL HIGH (ref 8–23)
CO2: 28 mmol/L (ref 22–32)
Calcium: 8.6 mg/dL — ABNORMAL LOW (ref 8.9–10.3)
Chloride: 100 mmol/L (ref 98–111)
Creatinine, Ser: 1.45 mg/dL — ABNORMAL HIGH (ref 0.61–1.24)
GFR, Estimated: 49 mL/min — ABNORMAL LOW (ref >60)
Glucose, Bld: 119 mg/dL — ABNORMAL HIGH (ref 70–99)
Potassium: 4.2 mmol/L (ref 3.5–5.1)
Sodium: 137 mmol/L (ref 135–145)

## 2021-07-31 LAB — CBC
HCT: 37.5 % — ABNORMAL LOW (ref 39.0–52.0)
Hemoglobin: 12.2 g/dL — ABNORMAL LOW (ref 13.0–17.0)
MCH: 29.6 pg (ref 26.0–34.0)
MCHC: 32.5 g/dL (ref 30.0–36.0)
MCV: 91 fL (ref 80.0–100.0)
Platelets: 154 10*3/uL (ref 150–400)
RBC: 4.12 MIL/uL — ABNORMAL LOW (ref 4.22–5.81)
RDW: 17.1 % — ABNORMAL HIGH (ref 11.5–15.5)
WBC: 11.1 10*3/uL — ABNORMAL HIGH (ref 4.0–10.5)
nRBC: 0 % (ref 0.0–0.2)

## 2021-07-31 LAB — PROTIME-INR
INR: 2.1 — ABNORMAL HIGH (ref 0.8–1.2)
Prothrombin Time: 23 seconds — ABNORMAL HIGH (ref 11.4–15.2)

## 2021-07-31 MED ORDER — WARFARIN SODIUM 7.5 MG PO TABS
7.5000 mg | ORAL_TABLET | Freq: Once | ORAL | Status: AC
Start: 2021-07-31 — End: 2021-07-31
  Administered 2021-07-31: 7.5 mg via ORAL
  Filled 2021-07-31: qty 1

## 2021-07-31 NOTE — Progress Notes (Signed)
PROGRESS NOTE    ZAKHAI MEISINGER  ZOX:[id number] DOB: Nov 03, 1945 DOA: 07/26/2021  PCP: [name], MD   Brief Narrative: This 76 year old male with PMH significant of chronic atrial fibrillation (on coumadin), hypertension, hyperlipidemia, mild pulmonary hypertension, non-ischemic cardiomyopathy (normal cath 10/1189) with systolic congestive heart failure (Echo 04/2021 EF 20-25%), pulmonary hypertension, obstructive sleep apnea on CPAP, obesity, COPD who presentd in the ED with complaints of generalized weakness.  EMS was called due to progressively worsening symptoms.  Patient was found to have A-fib with RVR with heart rate in 150s. Upon evaluation in the ED found to have INR 3.8.  CTA chest shows small segmental subsegmental pulmonary emboli with right heart strain.  Patient is started on heparin gtt.  Hematology is consulted for anticoagulation, cardiology is consulted for A-fib with RVR. Given his hx of seizures , on phenobarbitol, DOAC is not an option, resumed on coumadin.  Assessment & Plan:   Principal Problem:   Acute pulmonary embolism (HCC) Active Problems:   Atrial fibrillation with rapid ventricular response (HCC)   Acute on chronic systolic CHF (congestive heart failure) (HCC)   AKI (acute kidney injury) ([place])   Essential hypertension   COPD (chronic obstructive pulmonary disease) (HCC)   Mixed hyperlipidemia   OSA on CPAP  Acute pulmonary embolism: Patient presented with several weeks history of generalized weakness. He is found to have bilateral pulmonary emboli on CT chest while being on Coumadin. Patient reports compliance with this medication.   Patient does report several weeks ago his INR was subtherapeutic requiring his PCP to increase his dose of Coumadin. Patient is started on IV heparin. Hematology consulted to determine if anticoagulant can be switched to different one.  Continue supplemental oxygen as needed. Since patient is on phenobarbital given history  of seizures, DOAC is not an option.  Patient is started back on Coumadin.  Atrial fibrillation with RVR: Patient presented with A-fib with RVR.Heart rate controlled after giving 5 mg IV metoprolol.   Heart rate continued to remain elevated in the setting of CHF and acute PE.   Continue metoprolol 100 mg twice daily. Cardiology suggested to resume Coumadin. Patient has an episode of A-fib with RVR while having breakfast .  Cardiology has added digoxin low-dose. Continue to [place]. Patient still has episodes of A. Fib with RVR .  AKI on CKD stage IIIb: Serum creatinine last month 1.3, presented with creatinine 1.73 Could be multifactorial. Patient appears volume overloaded at this time. Avoid nephrotoxic medications, renal functions improving.  Acute on chronic systolic CHF: On exam patient has bibasilar crackles, pitting edema, elevated JVD. Patient was given Lasix, felt much improved. Continued on Lasix 60 mg iv twice daily, transitioned to torsemide 60 mg daily. Monitor daily intake output charting.  2.7 net negative balance. Echocardiogram this admission shows LVEF 20%. Continue metoprolol 100 mg twice daily, consider starting losartan if blood pressure and creatinine improves.  Essential hypertension: Continue metoprolol, torsemide.  COPD: No evidence of exacerbation at this time.   Continue home bronchodilators.  Hyperlipidemia: Continue Pravastatin  OSA on CPAP: Continue CPAP  History of seizures.: Patient has developed PE while being on Coumadin. Unable to use DOAC with phenobarbital.    DVT prophylaxis: Coumadin Code Status: Full code Family Communication: No family at bedside Disposition Plan:  Status is: Inpatient Remains inpatient appropriate because: Admitted for acute PE while being on Coumadin.  Acute on chronic CHF exacerbation, AKI on CKD stage IIIb. A.fib with RVR . Patient is improving.  Anticipated  discharge home  08/01/21.    Consultants:   Cardiology  Procedures: Echocardiogram Antimicrobials: None   subjective: Patient was seen and examined at bedside. Overnight events noted.   Patient reports feeling better, denies any chest pain. He does have intermittent periods of A.fib where his HR up to 150, now controlled. Patient reports feeling better.  Objective: Vitals:   07/30/21 1938 07/31/21 0005 07/31/21 0403 07/31/21 0800  BP: 96/66 104/72 99/66 97/67  Pulse: (!) 115 (!) 104 94 100  Resp: _0 Temp: 99.4 F (37.4 C) 99.5 F (37.5 C) 98.4 F (36.9 C) 97.9 F (36.6 C)  TempSrc:  Oral Oral Oral  SpO2:  99% 94%   Weight:   101.2 kg   Height:        Intake/Output Summary (Last 24 hours) at 07/31/2021 1348 Last data filed at 07/31/2021 1156 Gross per 24 hour  Intake 330.16 ml  Output 750 ml  Net -419.84 ml   Filed Weights   07/29/21 1017 07/30/21 0615 07/31/21 0403  Weight: 104 kg 104.5 kg 101.2 kg    Examination:  General exam: Appears comfortable, not in any acute distress.  Deconditioned Respiratory system: CTA bilaterally, respiratory effort normal, RR 15 Cardiovascular system: S1 & S2 heard, regular rate and rhythm, no murmur. Gastrointestinal system: Abdomen is soft, nontender, nondistended, BS+ Central nervous system: Alert and oriented X 3 . No focal neurological deficits. Extremities: Edema+, no clubbing, no cyanosis. Skin: No rashes, lesions or ulcers Psychiatry: Judgement and insight appear normal. Mood & affect appropriate.     Data Reviewed: I have personally reviewed following labs and imaging studies  CBC: Recent Labs  Lab 07/27/21 0205 07/27/21 1231 07/28/21 0315 07/29/21 0321 07/30/21 0256 07/31/21 0257  WBC 8.5 7.2 8.8 8.1 8.5 11.1*  NEUTROABS 5.2  --   --   --   --   --   HGB 12.4* 12.2* 12.5* 12.8* 12.3* 12.2*  HCT 38.6* 37.2* 39.1 38.4* 38.1* 37.5*  MCV 92.6 92.1 92.2 90.4 92.3 91.0  PLT 134* 129* 144* 154 164 300   Basic Metabolic Panel: Recent Labs  Lab  07/26/21 1757 07/27/21 0205 07/27/21 1231 07/28/21 0315 07/29/21 0321 07/30/21 0256 07/31/21 0257  NA 137 139 141 138 135 133* 137  K 3.9 4.2 3.2* 3.9 3.3* 4.1 4.2  CL 99 101 101 99 97* 95* 100  CO2 28 30 32 _1 GLUCOSE 123* 96 141* 100* 149* 107* 119*  BUN 36* 30* 28* 26* 25* 26* 24*  CREATININE 1.73* 1.70* 1.48* 1.52* 1.58* 1.59* 1.45*  CALCIUM 8.6* 8.7* 8.5* 8.5* 8.3* 8.5* 8.6*  MG 2.5* 2.4  --  2.4 2.3  --   --   PHOS  --   --   --  3.8 3.3  --   --    GFR: Estimated Creatinine Clearance: 48.4 mL/min (A) (by C-G formula based on SCr of 1.45 mg/dL (H)). Liver Function Tests: Recent Labs  Lab 07/27/21 0205 07/29/21 0321  AST 23 21  ALT 17 16  ALKPHOS 122 115  BILITOT 0.5 0.7  PROT 6.5 6.4*  ALBUMIN 3.4* 3.3*   No results for input(s): LIPASE, AMYLASE in the last 168 hours. No results for input(s): AMMONIA in the last 168 hours. Coagulation Profile: Recent Labs  Lab 07/27/21 1231 07/28/21 0315 07/29/21 0321 07/30/21 0256 07/31/21 0257  INR 2.8* 2.8* 2.2* 2.3* 2.1*   Cardiac Enzymes: No results for input(s): CKTOTAL, CKMB, CKMBINDEX, TROPONINI  in the last 168 hours. BNP (last 3 results) No results for input(s): PROBNP in the last 8760 hours. HbA1C: No results for input(s): HGBA1C in the last 72 hours. CBG: Recent Labs  Lab 07/29/21 0911  GLUCAP 167*   Lipid Profile: Recent Labs    07/30/21 0256  CHOL 110  HDL 38*  LDLCALC 59  TRIG 67  CHOLHDL 2.9   Thyroid Function Tests: No results for input(s): TSH, T4TOTAL, FREET4, T3FREE, THYROIDAB in the last 72 hours. Anemia Panel: No results for input(s): VITAMINB12, FOLATE, FERRITIN, TIBC, IRON, RETICCTPCT in the last 72 hours. Sepsis Labs: No results for input(s): PROCALCITON, LATICACIDVEN in the last 168 hours.  Recent Results (from the past 240 hour(s))  Resp Panel by RT-PCR (Flu A&B, Covid) Nasopharyngeal Swab     Status: None   Collection Time: 07/26/21 10:00 PM   Specimen:  Nasopharyngeal Swab; Nasopharyngeal(NP) swabs in vial transport medium  Result Value Ref Range Status   SARS Coronavirus 2 by RT PCR NEGATIVE NEGATIVE Final    Comment: (NOTE) SARS-CoV-2 target nucleic acids are NOT DETECTED.  The SARS-CoV-2 RNA is generally detectable in upper respiratory specimens during the acute phase of infection. The lowest concentration of SARS-CoV-2 viral copies this assay can detect is 138 copies/mL. A negative result does not preclude SARS-Cov-2 infection and should not be used as the sole basis for treatment or other patient management decisions. A negative result may occur with  improper specimen collection/handling, submission of specimen other than nasopharyngeal swab, presence of viral mutation(s) within the areas targeted by this assay, and inadequate number of viral copies(<138 copies/mL). A negative result must be combined with clinical observations, patient history, and epidemiological information. The expected result is Negative.  Fact Sheet for Patients:  EntrepreneurPulse.com.au  Fact Sheet for Healthcare Providers:  IncredibleEmployment.be  This test is no t yet approved or cleared by the [place] FDA and  has been authorized for detection and/or diagnosis of SARS-CoV-2 by FDA under an Emergency Use Authorization (EUA). This EUA will remain  in effect (meaning this test can be used) for the duration of the COVID-19 declaration under Section 564(b)(1) of the Act, 21 U.S.C.section 360bbb-3(b)(1), unless the authorization is terminated  or revoked sooner.       Influenza A by PCR NEGATIVE NEGATIVE Final   Influenza B by PCR NEGATIVE NEGATIVE Final    Comment: (NOTE) The Xpert Xpress SARS-CoV-2/FLU/RSV plus assay is intended as an aid in the diagnosis of influenza from Nasopharyngeal swab specimens and should not be used as a sole basis for treatment. Nasal washings and aspirates are unacceptable for  Xpert Xpress SARS-CoV-2/FLU/RSV testing.  Fact Sheet for Patients: EntrepreneurPulse.com.au  Fact Sheet for Healthcare Providers: IncredibleEmployment.be  This test is not yet approved or cleared by the [place] FDA and has been authorized for detection and/or diagnosis of SARS-CoV-2 by FDA under an Emergency Use Authorization (EUA). This EUA will remain in effect (meaning this test can be used) for the duration of the COVID-19 declaration under Section 564(b)(1) of the Act, 21 U.S.C. section 360bbb-3(b)(1), unless the authorization is terminated or revoked.  Performed at [facility], [place] [address] [place], [place] [address]     Radiology Studies: No results found.  Scheduled Meds:  digoxin  0.125 mg Oral Daily   loratadine  10 mg Oral Daily   metoprolol tartrate  100 mg Oral BID   multivitamin with minerals  1 tablet Oral Q1200   phenobarbital  97.2 mg  Oral QAC supper   pravastatin  40 mg Oral QHS   torsemide  60 mg Oral Daily   warfarin  7.5 mg Oral ONCE-1600   Warfarin - Pharmacist Dosing Inpatient   Does not apply q1600   Continuous Infusions:   LOS: 5 days    Time spent: 35 mins   Abrian Hanover, MD [facility]   If 7PM-7AM, please contact night-coverage

## 2021-07-31 NOTE — Progress Notes (Signed)
Mobility Specialist: Progress Note   07/31/21 1718  Mobility  Activity Ambulated with assistance in room  Level of Assistance Minimal assist, patient does 75% or more  Assistive Device Front wheel walker  Distance Ambulated (ft) 16 ft (8'x2)  Activity Response Tolerated fair  $Mobility charge 1 Mobility   Pre-Mobility: 120 HR, 95% SpO2 During Mobility: 140s-160s (172 peak) HR, 94% SpO2 Post-Mobility: 131 HR, 97%SpO2  Pt received in the bed and agreeable to mobility. C/o L foot pain during session, no rating given. Stopped x2 for seated break d/t elevated HR. Wheeled pt back to the bed after ambulation. Pt back in the bed with call bell and phone in reach. Bed alarm is on.   [facility] Anahit Klumb Mobility Specialist Mobility Specialist 5 North: [phone] Mobility Specialist 6 North: [phone]

## 2021-07-31 NOTE — Progress Notes (Signed)
Palliative Medicine Inpatient Follow Up Note  HPI: 76 year old male with past medical history of chronic atrial fibrillation (on coumadin), hypertension, hyperlipidemia, mild pulmonary hypertension, nonischemic cardiomyopathy (normal cath 08/3891) with systolic congestive heart failure (Echo 04/2021 EF 20-25%), pulmonary hypertension, obstructive sleep apnea on CPAP, obesity, COPD who presents to [facility] emergency department via EMS with complaints of generalized weakness.   Palliative care has been asked to get involved to further review code status.   Today's Discussion 07/31/2021  *Please note that this is a verbal dictation therefore any spelling or grammatical errors are due to the "[place]" system interpretation.  Chart reviewed inclusive of vital signs, progress notes, laboratory results, and diagnostic images.   Patient continues to have difficulty with movement in terms of his HR control.   I met with [name] at bedside this morning, he was in good spirits. He did not remember meeting with me yesterday. I reviewed out conversations and the MOST document which was completed which he then remembered. He shared that there are "too many nurses in here." Confirmed code status again in accordance with MOST. Patient wants continued measures to improve health condition though would not want heroics in terms of life support to sustain life.   We discussed his concern for his wife if his health should continue to decline. I shared that at some point there may need to be consideration of placement somewhere that could offer both of them mor assistance.   Created space and opportunity for patient to explore thoughts feelings and fears regarding current medical situation. Fuquan shares that he wants to stay home and remain independent as long as possible.   Reviewed that Andrus would like a MOST to be completed for his wife as well. An additional copy was provided for him  to complete with her at home and to bring to his PCP for review and signature.   Questions and concerns addressed   Palliative Support Provided  Objective Assessment: Vital Signs Vitals:   07/31/21 0403 07/31/21 0800  BP: 99/66 97/67  Pulse: 94 100  Resp: 20 20  Temp: 98.4 F (36.9 C) 97.9 F (36.6 C)  SpO2: 94%     Intake/Output Summary (Last 24 hours) at 07/31/2021 7342 Last data filed at 07/31/2021 0800 Gross per 24 hour  Intake 690.16 ml  Output 725 ml  Net -34.84 ml   Last Weight  Most recent update: 07/31/2021  4:05 AM    Weight  101.2 kg (223 lb)            Gen: Elderly Caucasian man in no acute distress HEENT: moist mucous membranes CV: Regular rate and irregular rhythm PULM: On room air breathing is even and nonlabored ABD: soft/nontender EXT: No edema Neuro: Alert and oriented x3  SUMMARY OF RECOMMENDATIONS     DNAR/DNI --> Patient wants continued measures to improve his health condition though would not want heroics in terms of life support to sustain life.    MOST form completed and placed in vincula   Treat what is treatable   Outpatient palliative care support on discharge   Palliative care will continue to follow for the time being  Billing based on MDM: High ______________________________________________________________________________________ [place] Team Team Cell Phone: [phone] Please utilize secure chat with additional questions, if there is no response within 30 minutes please call the above phone number  Palliative Medicine Team providers are available by phone from 7am to 7pm daily and  can be reached through the team cell phone.  Should this patient require assistance outside of these hours, please call the patient's attending physician.

## 2021-07-31 NOTE — Progress Notes (Signed)
ANTICOAGULATION CONSULT NOTE - Follow Up Consult  Pharmacy Consult for Warfarin Indication: pulmonary embolus  Allergies  Allergen Reactions   Dilaudid [Hydromorphone Hcl] Other (See Comments)    Makes pt hyper    Hydromorphone Other (See Comments)    hyperactiviity Other reaction(s): made him wild Other reaction(s): Unknown   Morphine Sulfate     Other reaction(s): at high dose causes confusion   Tegretol [Carbamazepine] Hives   Carbamazepine Hives and Rash    Other reaction(s): hives Other reaction(s): Hives    Patient Measurements: Height: _0  (172.7 cm) Weight: 101.2 kg (223 lb) IBW/kg (Calculated) : 68.4 kg  Vital Signs: Temp: 97.9 F (36.6 C) (05/28 0800) Temp Source: Oral (05/28 0800) BP: 97/67 (05/28 0800) Pulse Rate: 100 (05/28 0800)  Labs: Recent Labs    07/29/21 0321 07/30/21 0256 07/31/21 0257  HGB 12.8* 12.3* 12.2*  HCT 38.4* 38.1* 37.5*  PLT 154 164 154  LABPROT 23.9* 25.5* 23.0*  INR 2.2* 2.3* 2.1*  CREATININE 1.58* 1.59* 1.45*    Estimated Creatinine Clearance: 48.4 mL/min (A) (by C-G formula based on SCr of 1.45 mg/dL (H)).   Medications:  Scheduled:   digoxin  0.125 mg Oral Daily   loratadine  10 mg Oral Daily   metoprolol tartrate  100 mg Oral BID   multivitamin with minerals  1 tablet Oral Q1200   phenobarbital  97.2 mg Oral QAC supper   pravastatin  40 mg Oral QHS   torsemide  60 mg Oral Daily   Warfarin - Pharmacist Dosing Inpatient   Does not apply q1600   Infusions:  PRN: acetaminophen **OR** acetaminophen, albuterol, fluticasone, hydrALAZINE, polyethylene glycol  Assessment: 76 yo male presents with atrial fibrillation and found to have bilateral pulmonary emboli. PTA the patient is on warfarin, INR 3.8 on admit. Pharmacy is consulted to dose warfarin.    PTA warfarin regimen: 5 mg daily except 2.5 mg on Mondays   The patient is well controlled on phenobarbital for a history of seizures, which can reduce the efficacy of  DOACs and most sources recommend avoiding DOACs in patients on phenobarbital. Enoxaparin has a cost of $99/month for the patient.    INR is therapeutic at 2.1 today, which drifted down slightly from 2.3 after receiving 5 mg yesterday. There are no documented signs or symptoms of bleeding. There have been no significant diet changes, and no new interacting medications have been initiated. The patient received one 150 mg dose of amiodarone on 5/27, but no daily amiodarone orders are placed. Hgb 12.2, platelets 154.  Goal of Therapy:  INR 2-3 Monitor platelets by anticoagulation protocol: Yes   Plan:  Give warfarin PO 7.5 mg x 1 dose tonight Obtain a daily PT/INR and Q72H CBC Monitor for signs and symptoms of bleeding Watch for drug-drug interactions and significant diet changes   Shauna [name], PharmD, [place]  PGY-2 Pharmacy Resident 07/31/2021 9:33 AM  Please check [email or web address] for unit-specific pharmacy phone numbers.

## 2021-08-01 ENCOUNTER — Other Ambulatory Visit (HOSPITAL_COMMUNITY): Payer: Self-pay

## 2021-08-01 DIAGNOSIS — I2699 Other pulmonary embolism without acute cor pulmonale: Secondary | ICD-10-CM | POA: Diagnosis not present

## 2021-08-01 LAB — CBC
HCT: 38.6 % — ABNORMAL LOW (ref 39.0–52.0)
Hemoglobin: 12.4 g/dL — ABNORMAL LOW (ref 13.0–17.0)
MCH: 29.5 pg (ref 26.0–34.0)
MCHC: 32.1 g/dL (ref 30.0–36.0)
MCV: 91.7 fL (ref 80.0–100.0)
Platelets: 162 10*3/uL (ref 150–400)
RBC: 4.21 MIL/uL — ABNORMAL LOW (ref 4.22–5.81)
RDW: 17.2 % — ABNORMAL HIGH (ref 11.5–15.5)
WBC: 10.2 10*3/uL (ref 4.0–10.5)
nRBC: 0 % (ref 0.0–0.2)

## 2021-08-01 LAB — BASIC METABOLIC PANEL
Anion gap: 9 (ref 5–15)
BUN: 26 mg/dL — ABNORMAL HIGH (ref 8–23)
CO2: 29 mmol/L (ref 22–32)
Calcium: 8.5 mg/dL — ABNORMAL LOW (ref 8.9–10.3)
Chloride: 97 mmol/L — ABNORMAL LOW (ref 98–111)
Creatinine, Ser: 1.6 mg/dL — ABNORMAL HIGH (ref 0.61–1.24)
GFR, Estimated: 44 mL/min — ABNORMAL LOW (ref >60)
Glucose, Bld: 120 mg/dL — ABNORMAL HIGH (ref 70–99)
Potassium: 3.9 mmol/L (ref 3.5–5.1)
Sodium: 135 mmol/L (ref 135–145)

## 2021-08-01 LAB — CARDIOLIPIN ANTIBODIES, IGG, IGM, IGA
Anticardiolipin IgA: <9 APL U/mL (ref 0–11)
Anticardiolipin IgG: <9 GPL U/mL (ref 0–14)
Anticardiolipin IgM: 16 MPL U/mL — ABNORMAL HIGH (ref 0–12)

## 2021-08-01 LAB — MAGNESIUM: Magnesium: 2.4 mg/dL (ref 1.7–2.4)

## 2021-08-01 LAB — PROTIME-INR
INR: 1.8 — ABNORMAL HIGH (ref 0.8–1.2)
Prothrombin Time: 21.1 seconds — ABNORMAL HIGH (ref 11.4–15.2)

## 2021-08-01 LAB — PHOSPHORUS: Phosphorus: 4.1 mg/dL (ref 2.5–4.6)

## 2021-08-01 MED ORDER — DIGOXIN 125 MCG PO TABS: 0.1250 mg | ORAL_TABLET | Freq: Every day | ORAL | 1 refills | Status: DC

## 2021-08-01 MED ORDER — WARFARIN SODIUM 7.5 MG PO TABS: 7.5000 mg | ORAL_TABLET | Freq: Once | ORAL | Status: DC

## 2021-08-01 MED ORDER — TORSEMIDE 60 MG PO TABS: 60.0000 mg | ORAL_TABLET | Freq: Every day | ORAL | 1 refills | Status: DC

## 2021-08-01 NOTE — Discharge Instructions (Signed)
Advised to follow-up with primary care physician in 1 week. Advised to follow-up with cardiology as scheduled. Advised to take digoxin 0.125 mg daily, torsemide 60 mg daily.

## 2021-08-01 NOTE — Progress Notes (Addendum)
Progress Note  Patient Name: Ruben Murray Date of Encounter: 08/01/2021  [facility] Cardiologist: [name], MD   Subjective   Overall doing fairly well.  Did have an episode of rapid heart rate again.  Inpatient Medications    Scheduled Meds:  digoxin  0.125 mg Oral Daily   loratadine  10 mg Oral Daily   metoprolol tartrate  100 mg Oral BID   multivitamin with minerals  1 tablet Oral Q1200   phenobarbital  97.2 mg Oral QAC supper   pravastatin  40 mg Oral QHS   torsemide  60 mg Oral Daily   warfarin  7.5 mg Oral ONCE-1600   Warfarin - Pharmacist Dosing Inpatient   Does not apply q1600   Continuous Infusions:  PRN Meds: acetaminophen **OR** acetaminophen, albuterol, fluticasone, hydrALAZINE, polyethylene glycol   Vital Signs    Vitals:   08/01/21 0032 08/01/21 0536 08/01/21 0758 08/01/21 0911  BP:  110/62  101/73  Pulse:  93  82  Resp:  18    Temp: 99.5 F (37.5 C) 97.6 F (36.4 C) 97.9 F (36.6 C)   TempSrc: Oral Oral Oral   SpO2:  95%    Weight:  103.9 kg    Height:        Intake/Output Summary (Last 24 hours) at 08/01/2021 0921 Last data filed at 08/01/2021 0755 Gross per 24 hour  Intake 600 ml  Output 950 ml  Net -350 ml      08/01/2021    5:36 AM 07/31/2021    4:03 AM 07/30/2021    6:15 AM  Last 3 Weights  Weight (lbs) 229 lb 223 lb 230 lb 6.4 oz  Weight (kg) 103.874 kg 101.152 kg 104.509 kg      Telemetry    A-fib mostly in the 110 range.- Personally Reviewed  ECG    No new- Personally Reviewed  Physical Exam   GEN: No acute distress.   Neck: No JVD Cardiac: Irregularly irregular, no murmurs, rubs, or gallops.  Respiratory: Clear to auscultation bilaterally. GI: Soft, nontender, non-distended  MS: No edema; No deformity. Neuro:  Nonfocal  Psych: Normal affect   Labs    High Sensitivity Troponin:   Recent Labs  Lab 07/26/21 2254 07/27/21 0205  TROPONINIHS 7 7     Chemistry Recent Labs  Lab 07/27/21 0205  07/27/21 1231 07/28/21 0315 07/29/21 0321 07/30/21 0256 07/31/21 0257 08/01/21 0118  NA 139   < > 138 135 133* 137 135  K 4.2   < > 3.9 3.3* 4.1 4.2 3.9  CL 101   < > 99 97* 95* 100 97*  CO2 30   < > _0 GLUCOSE 96   < > 100* 149* 107* 119* 120*  BUN 30*   < > 26* 25* 26* 24* 26*  CREATININE 1.70*   < > 1.52* 1.58* 1.59* 1.45* 1.60*  CALCIUM 8.7*   < > 8.5* 8.3* 8.5* 8.6* 8.5*  MG 2.4  --  2.4 2.3  --   --  2.4  PROT 6.5  --   --  6.4*  --   --   --   ALBUMIN 3.4*  --   --  3.3*  --   --   --   AST 23  --   --  21  --   --   --   ALT 17  --   --  16  --   --   --  ALKPHOS 122  --   --  115  --   --   --   BILITOT 0.5  --   --  0.7  --   --   --   GFRNONAA 41*   < > 47* 44* 44* 49* 44*  ANIONGAP 8   < > _0 < > = values in this interval not displayed.    Lipids  Recent Labs  Lab 07/30/21 0256  CHOL 110  TRIG 67  HDL 38*  LDLCALC 59  CHOLHDL 2.9    Hematology Recent Labs  Lab 07/30/21 0256 07/31/21 0257 08/01/21 0118  WBC 8.5 11.1* 10.2  RBC 4.13* 4.12* 4.21*  HGB 12.3* 12.2* 12.4*  HCT 38.1* 37.5* 38.6*  MCV 92.3 91.0 91.7  MCH 29.8 29.6 29.5  MCHC 32.3 32.5 32.1  RDW 17.1* 17.1* 17.2*  PLT 164 154 162   Thyroid No results for input(s): TSH, FREET4 in the last 168 hours.  BNP Recent Labs  Lab 07/26/21 1757  BNP 753.5*     Cardiac Studies   EF 20 to 25% on echo  Patient Profile     76 y.o. male  with chronic systolic CHF/NICM, permanent atrial fibrillation and prior atrial flutter, chronic anticoag with Coumadin (not on DOAC 2/2 interaction with seizure med), cath 2021 with normal coronaries, pulmonary HTN (felt combination WHO group 2 & 3), mild MR/AI, moderate-severe TR, mild dilation of aortic root, hypertension, OSA, obesity, seizure, aortic atherosclerosis, COPD, hypertension, HLD, RBBB, QT prolongation, prior bedbug infestation who presented back to the hospital with weakness. Multiple admissions recently: - 03/8333 with a/c  systolic CHF/AF RVR after non-adherence with follow-up/medications -10/2516 with a/c systolic CH/AF RVR after nonadherence with medications (pt reported compliance though call to pharmacy indicated patient had not picked up several meds), bed bug infestation, caring for wife with dementia and bilateral hip fx - outpatient plan for CRT-D noted with EP due to difficult to control HRs with plan to revisit 09/2021 - this admission with generalized weakness, AF RVR HR 120s-150s -> CTA + small subsegmental bilateral PE  Assessment & Plan    1. Permanent atrial fibrillation with RVR - historically difficult to control HR, prompting prior EP consideration of AVN ablation + CRT+/-D - HRs have at times been challenging, were up to 160-170s just eating breakfast - now - received AM dose of metoprolol and now HR back to 100-1teens similar to recent baseline.  - digoxin stopped 06/03/21 due to level of 1.1 he was on 0.43m daily dosing - EP note 06/20/21 references continuation of 0.1213mbut not on med list at the time - resumed here at lower dose of 0.12583maily - ultimately would just benefit from proceeding with AVN ablation/device.  When we tried to do this before, he had a bedbug infestation and the procedure was canceled-  Dr. CamCurt Bearsnticipate outpatient f/u - historically on Coumadin rather than DOACs due to interaction with phenobarbital, INR followed by PCP -> hematology recommended transition to Lovenox but Dr. SkaMarlou Porchlt this was unlikely financially feasible for the patient so back on Coumadin   2. Bilateral small subsegmental PE - occurred in context of subtherapeutic INR (pt reports subtherapeutic INR several weeks prior and Coumadin increased at that time, also noted during 05/2021 hospitalization), unfortunately DOACs not an option with seizure med interaction - LE venous duplex negative for DVT - anticoagulation continuation with Coumadin.  Obviously if he had tremendous clot burden, DVT, large  proximal thrombosis in the pulmonary arteries, Lovenox would be advisable.  A few weeks ago he was subtherapeutic, possibly playing a role.  Once again thrombosis seen and pulmonary arteries are in very small subsegmental branches, very small clot burden.   3. Acute on chronic HFrEF - has been getting IV Lasix; no weights 5/25-5/26, will order  - restarted maintenance dose torsemide given low LVEF. - GDMT otherwise limited by hypotension   4. AKI superimposed on possible CKD stage IIIa, with hypokalemia this morning - recent baseline appears 1.1-1.3, peak 1.73 this admission, downtrended to 1.6 - potassium 3.9 today - avoid additional nephrotoxic meds   5. Mild MR, mild AI, moderate-severe TR, mild dilation of aortic root - continue clinical management of the above   6. Seizure disorder - has not had as seizure in 20+ years on phenobarbital. - management per primary team   7. Aortic atherosclerosis, hyperlipidemia - last LDL 04/2021 73 but not on meds at that time - continue pravastatin  OK with DC home. INR 1.8, will need Lovenox bridge transiently. Coumadin managed by Dr. Mannie Stabile, PCP.   For questions or updates, please contact [place] Please consult [email or web address] for contact info under        Signed, [name], MD  08/01/2021, 9:21 AM

## 2021-08-01 NOTE — Progress Notes (Signed)
Physical Therapy Treatment Patient Details Name: Ruben Murray MRN: [id number] DOB: November 17, 1945 Today's Date: 08/01/2021   History of Present Illness Pt is a 76 y.o. male admitted 07/26/21 with c/o generalized weakness. Patient was found to have a-fib with RVR with heart rate in 150s. CTA chest shows small segmental pulmonary emboli with right heart strain.PMH includes systolic CHF, HTN, OSA, seizure, aortic atherosclerosis, COPD, hypertension, persistent atrial fibrillation    PT Comments    Pt with improved mobility as lt foot pain has improved. Eager to return home and from PT standpoint ready for dc home.    Recommendations for follow up therapy are one component of a multi-disciplinary discharge planning process, led by the attending physician.  Recommendations may be updated based on patient status, additional functional criteria and insurance authorization.  Follow Up Recommendations  Home health PT     Assistance Recommended at Discharge PRN  Patient can return home with the following Assist for transportation;Help with stairs or ramp for entrance   Equipment Recommendations  None recommended by PT    Recommendations for Other Services       Precautions / Restrictions Precautions Precautions: Fall;Other (comment) Precaution Comments: Watch HR and BP Restrictions Weight Bearing Restrictions: No     Mobility  Bed Mobility Overal bed mobility: Modified Independent             General bed mobility comments: increased time and effort    Transfers Overall transfer level: Modified independent Equipment used: Rolling walker (2 wheels) Transfers: Sit to/from Stand Sit to Stand: Modified independent (Device/Increase time)                Ambulation/Gait Ambulation/Gait assistance: Supervision Gait Distance (Feet): 80 Feet Assistive device: Rolling walker (2 wheels) Gait Pattern/deviations: Decreased weight shift to left, Antalgic, Step-through pattern Gait  velocity: Decreased Gait velocity interpretation: <1.31 ft/sec, indicative of household ambulator   General Gait Details: Pt using walker to offload lt foot due to soreness. Gait steady   Marine scientist Rankin (Stroke Patients Only)       Balance Overall balance assessment: Needs assistance Sitting-balance support: No upper extremity supported, Feet supported Sitting balance-Leahy Scale: Fair     Standing balance support: No upper extremity supported, During functional activity Standing balance-Leahy Scale: Fair                              Cognition Arousal/Alertness: Awake/alert Behavior During Therapy: WFL for tasks assessed/performed Overall Cognitive Status: Within Functional Limits for tasks assessed                                          Exercises      General Comments General comments (skin integrity, edema, etc.): HR 110 with activity      Pertinent Vitals/Pain Pain Assessment Pain Assessment: Faces Faces Pain Scale: Hurts little more Pain Location: lt foot Pain Descriptors / Indicators: Sore Pain Intervention(s): Limited activity within patient's tolerance, Monitored during session    Home Living                          Prior Function            PT Goals (current goals can  now be found in the care plan section) Acute Rehab PT Goals Patient Stated Goal: return home, decreased pain Progress towards PT goals: Progressing toward goals    Frequency    Min 3X/week      PT Plan Current plan remains appropriate    Co-evaluation              AM-PAC PT "6 Clicks" Mobility   Outcome Measure  Help needed turning from your back to your side while in a flat bed without using bedrails?: None Help needed moving from lying on your back to sitting on the side of a flat bed without using bedrails?: None Help needed moving to and from a bed to a chair (including  a wheelchair)?: A Little Help needed standing up from a chair using your arms (e.g., wheelchair or bedside chair)?: None Help needed to walk in hospital room?: A Little Help needed climbing 3-5 steps with a railing? : A Little 6 Click Score: 21    End of Session   Activity Tolerance: Patient tolerated treatment well Patient left: with call bell/phone within reach;in chair Nurse Communication: Mobility status PT Visit Diagnosis: Other abnormalities of gait and mobility (R26.89);Pain Pain - Right/Left: Left Pain - part of body: Ankle and joints of foot     Time: 7096-2836 PT Time Calculation (min) (ACUTE ONLY): 18 min  Charges:  $Gait Training: 8-22 mins                     [facility] [facility] [place] 08/01/2021, 2:46 PM

## 2021-08-01 NOTE — Discharge Summary (Signed)
Physician Discharge Summary  Ruben Murray EQA:[id number] DOB: Jul 26, 1945 DOA: 07/26/2021  PCP: [name], MD  Admit date: 07/26/2021  Discharge date: 08/01/2021  Admitted From: Home.  Disposition: Home with Home services.  Recommendations for Outpatient Follow-up:  Follow up with PCP in 1-2 weeks. Please obtain BMP/CBC in one week Advised to follow-up with cardiology as scheduled. Advised to take digoxin 0.125 mg daily, torsemide 60 mg daily.  Home Health: Home OT/PT Equipment/Devices: None  Discharge Condition: Good CODE STATUS: DNR Diet recommendation: Heart Healthy   Brief Summary/ Hospital Course: This 76 year old male with PMH significant of chronic atrial fibrillation (on coumadin), hypertension, hyperlipidemia, mild pulmonary hypertension, non-ischemic cardiomyopathy (normal cath 11/7987) with systolic congestive heart failure (Echo 04/2021 EF 20-25%), pulmonary hypertension, obstructive sleep apnea on CPAP, obesity, COPD who presentd in the ED with complaints of generalized weakness.  EMS was called due to progressively worsening symptoms.  Patient was found to have A-fib with RVR with heart rate in 150s. Upon evaluation in the ED found to have INR 3.8.  CTA chest shows small segmental subsegmental pulmonary emboli with right heart strain.  Patient is started on heparin gtt.  Hematology is consulted for anticoagulation, cardiology is consulted for A-fib with RVR.  Patient was continued on heparin, cardiology states given his hx of seizures , on phenobarbitol, DOAC is not an option, resumed on coumadin. Patient continues to have intermittent episodes of atrial fibrillation with RVR which gets better after getting morning dose of metoprolol.  Cardiology recommended to continue current medication patient is started back on digoxin.  Patient feels better patient cleared from cardiology to be discharged.  Home health services been arranged.  Discharge Diagnoses:  Principal  Problem:   Acute pulmonary embolism ([place]) Active Problems:   Atrial fibrillation with rapid ventricular response ([facility])   Acute on chronic systolic CHF (congestive heart failure) (HCC)   AKI (acute kidney injury) ([place])   Essential hypertension   COPD (chronic obstructive pulmonary disease) (HCC)   Mixed hyperlipidemia   OSA on CPAP  Acute pulmonary embolism: Patient presented with several weeks history of generalized weakness. He is found to have bilateral pulmonary emboli on CT chest while being on Coumadin. Patient reports compliance with this medication.   Patient does report several weeks ago his INR was subtherapeutic requiring his PCP to increase his dose of Coumadin. Patient is started on IV heparin. Hematology consulted to determine if anticoagulant can be switched to different one.  Continue supplemental oxygen as needed. Since patient is on phenobarbital given history of seizures, DOAC is not an option.  Patient is started back on Coumadin. Continue Coumadin as prescribed, follow-up Coumadin clinic.   Atrial fibrillation with RVR: Patient presented with A-fib with RVR.Heart rate controlled after giving 5 mg IV metoprolol.   Heart rate continued to remain elevated in the setting of CHF and acute PE.   Continue metoprolol 100 mg twice daily. Cardiology suggested to resume Coumadin. Patient has an episode of A-fib with RVR while having breakfast .  Cardiology has added digoxin low-dose. Continue to [place]. Patient still has episodes of A. Fib with RVR .   AKI on CKD stage IIIb: Serum creatinine last month 1.3, presented with creatinine 1.73 Could be multifactorial. Patient appeared volume overloaded at this time. Avoid nephrotoxic medications, renal functions improving. Patient is improved.  Renal functions has improved. 1.60   Acute on chronic systolic CHF: On exam patient has bibasilar crackles, pitting edema, elevated JVD. Patient was given Lasix, felt much  improved. Continued on Lasix 60 mg iv twice daily, transitioned to torsemide 60 mg daily. Monitor daily intake output charting.  2.7 net negative balance. Echocardiogram this admission shows LVEF 20%. Continue metoprolol 100 mg twice daily, consider starting losartan if blood pressure and creatinine improves.   Essential hypertension: Continue metoprolol, torsemide.   COPD: No evidence of exacerbation at this time.   Continue home bronchodilators.   Hyperlipidemia: Continue Pravastatin   OSA on CPAP: Continue CPAP   History of seizures.: Patient has developed PE while being on Coumadin. Unable to use DOAC with phenobarbital.  Discharge Instructions  Discharge Instructions     Call MD for:  difficulty breathing, headache or visual disturbances   Complete by: As directed    Call MD for:  persistant dizziness or light-headedness   Complete by: As directed    Call MD for:  persistant nausea and vomiting   Complete by: As directed    Diet - low sodium heart healthy   Complete by: As directed    Diet Carb Modified   Complete by: As directed    Discharge instructions   Complete by: As directed    Advised to follow-up with primary care physician in 1 week. Advised to follow-up with cardiology as scheduled. Advised to take digoxin 0.125 mg daily, torsemide 60 mg daily.   Increase activity slowly   Complete by: As directed       Allergies as of 08/01/2021       Reactions   Dilaudid [hydromorphone Hcl] Other (See Comments)   Makes pt hyper    Hydromorphone Other (See Comments)   hyperactiviity Other reaction(s): made him wild Other reaction(s): Unknown   Morphine Sulfate    Other reaction(s): at high dose causes confusion   Tegretol [carbamazepine] Hives   Carbamazepine Hives, Rash   Other reaction(s): hives Other reaction(s): Hives        Medication List     TAKE these medications    acetaminophen 500 MG tablet Commonly known as: TYLENOL Take 1,000 mg by  mouth at bedtime as needed for mild pain or headache.   Carboxymethylcellulose Sod PF 0.5 % Soln Place 1 drop into both eyes 4 (four) times daily as needed (dry eyes).   digoxin 0.125 MG tablet Commonly known as: LANOXIN Take 1 tablet (0.125 mg total) by mouth daily. Start taking on: Aug 02, 2021   empagliflozin 10 MG Tabs tablet Commonly known as: Jardiance Take 1 tablet (10 mg total) by mouth daily before breakfast.   escitalopram 20 MG tablet Commonly known as: LEXAPRO Take 20 mg by mouth daily. Takes at bedtime.   finasteride 5 MG tablet Commonly known as: PROSCAR Take 5 mg by mouth daily at 12 noon.   fluticasone 50 MCG/ACT nasal spray Commonly known as: FLONASE Place 1 spray into both nostrils 2 (two) times daily.   loratadine 10 MG tablet Commonly known as: CLARITIN Take 10 mg by mouth daily.   metoprolol tartrate 100 MG tablet Commonly known as: LOPRESSOR Take 1 tablet (100 mg total) by mouth 2 (two) times daily.   multivitamin with minerals Tabs tablet Take 1 tablet by mouth daily at 12 noon.   PHENobarbital 97.2 MG tablet Commonly known as: LUMINAL Take 97.2 mg by mouth daily. Takes between 3:00-5:00pm.   potassium chloride SA 20 MEQ tablet Commonly known as: KLOR-CON M Take 1 tablet (20 mEq total) by mouth daily.   pravastatin 40 MG tablet Commonly known as: PRAVACHOL Take 40 mg by mouth  at bedtime.   spironolactone 25 MG tablet Commonly known as: ALDACTONE Take 0.5 tablets (12.5 mg total) by mouth daily.   Torsemide 60 MG Tabs Take 60 mg by mouth daily. Start taking on: Aug 02, 2021 What changed:  medication strength how much to take when to take this   triamcinolone cream 0.1 % Commonly known as: KENALOG Apply 1 application. topically 2 (two) times daily as needed (rash).   Vitamin D3 25 MCG (1000 UT) Caps Take 1,000 Units by mouth daily.   warfarin 5 MG tablet Commonly known as: COUMADIN Take 2.5-5 mg by mouth See admin instructions.  Taking 2.5 mg ( 1/2 tablet) on Monday only. All other days of the week taking 5 mg.        Follow-up Information     [name], MD Follow up in 1 week(s).   Specialty: Cardiology Contact information: [address] [address] [phone]         [name], MD Follow up in 1 week(s).   Specialty: Family Medicine Contact information: [place] [address] [phone]         [name], MD .   Specialty: Cardiology Contact information: [id number] [place] [address] [phone]         [name], MD .   Specialty: Cardiology Contact information: [address]  [place] [address] [phone]                Allergies  Allergen Reactions   Dilaudid [Hydromorphone Hcl] Other (See Comments)    Makes pt hyper    Hydromorphone Other (See Comments)    hyperactiviity Other reaction(s): made him wild Other reaction(s): Unknown   Morphine Sulfate     Other reaction(s): at high dose causes confusion   Tegretol [Carbamazepine] Hives   Carbamazepine Hives and Rash    Other reaction(s): hives Other reaction(s): Hives    Consultations: Cardiology   Procedures/Studies: CT Angio Chest PE W and/or Wo Contrast  Result Date: 07/26/2021 CLINICAL DATA:  Pulmonary embolism suspected, high probability. Weakness, tachycardia. EXAM: CT ANGIOGRAPHY CHEST WITH CONTRAST TECHNIQUE: Multidetector CT imaging of the chest was performed using the standard protocol during bolus administration of intravenous contrast. Multiplanar CT image reconstructions and MIPs were obtained to evaluate the vascular anatomy. RADIATION DOSE REDUCTION: This exam was performed according to the departmental dose-optimization program which includes automated exposure control, adjustment of the mA and/or kV according to patient size and/or use of iterative reconstruction technique. CONTRAST:   22m OMNIPAQUE IOHEXOL 350 MG/ML SOLN COMPARISON:  04/25/2016. FINDINGS: Cardiovascular: The heart is enlarged and there is no pericardial effusion. Scattered coronary artery calcifications are noted. There is atherosclerotic calcification of the aorta without evidence of aneurysm. The pulmonary trunk is normal in caliber. A few small segmental and subsegmental pulmonary artery filling defects are noted in the left lower lobe, right lower lobe, and right upper lobe. The right ventricle is distended suggesting underlying right heart strain. Mediastinum/Nodes: No mediastinal, hilar, or axillary lymphadenopathy. The thyroid gland, trachea, and esophagus are within normal limits. Lungs/Pleura: Atelectasis is noted bilaterally. No effusion or pneumothorax. Upper Abdomen: There is reflux of contrast into the inferior vena cava and hepatic veins, which may be associated with right heart failure. No acute abnormality. Musculoskeletal: Mild degenerative changes are present in the thoracic spine. No acute osseous abnormality. Review of the MIP images confirms the above findings. IMPRESSION: 1.  Small segmental and subsegmental pulmonary emboli bilaterally. The right ventricle is distended which may be associated with underlying right heart strain. 2. Cardiomegaly with coronary artery calcifications. 3. Reflux of contrast into the inferior vena cava and hepatic veins, may be associated with right heart failure. 4. Aortic atherosclerosis. Critical findings reported to Dr. Regan Lemming at 9:55 p.m. Electronically Signed   By: Brett Fairy M.D.   On: 07/26/2021 21:57   DG Chest Portable 1 View  Result Date: 07/26/2021 CLINICAL DATA:  Near syncope EXAM: PORTABLE CHEST 1 VIEW COMPARISON:  05/17/2021, 04/19/2021, 10/29/2017 FINDINGS: Globular cardiac enlargement. No consolidation, pleural effusion, or pneumothorax. IMPRESSION: 1. Cardiomegaly with globular cardiac configuration as before which could be due to multi chamber  enlargement or potential pericardial fluid. 2. No acute airspace disease Electronically Signed   By: Donavan Foil M.D.   On: 07/26/2021 18:47   ECHOCARDIOGRAM COMPLETE  Result Date: 07/27/2021    ECHOCARDIOGRAM REPORT   Patient Name:   Ruben Murray Date of Exam: 07/27/2021 Medical Rec #:  [id number]       Height:       68.0 in Accession #:    [id number]      Weight:       232.1 lb Date of Birth:  Aug 02, 1945       BSA:          2.177 m Patient Age:    28 years        BP:           122/78 mmHg Patient Gender: M               HR:           76 bpm. Exam Location:  Inpatient Procedure: 2D Echo, Cardiac Doppler and Color Doppler Indications:    I50.23 Acute on chronic systolic (congestive) heart failure  History:        Patient has prior history of Echocardiogram examinations, most                 recent 04/20/2021. Cardiomyopathy, Arrythmias:Atrial Flutter and                 Atrial Fibrillation, Signs/Symptoms:Shortness of Breath and                 Fatigue; Risk Factors:Sleep Apnea, Hypertension, Dyslipidemia                 and Former Smoker.  Sonographer:    Wilkie Aye RVT Referring Phys: [id number] [place]  Sonographer Comments: Suboptimal apical window. Patients inability to reposition. IMPRESSIONS  1. Left ventricular ejection fraction, by estimation, is 20%. The left ventricle has severely decreased function. The left ventricle demonstrates global hypokinesis. The left ventricular internal cavity size was mildly dilated. Left ventricular diastolic parameters are indeterminate.  2. Right ventricular systolic function is moderately reduced. The right ventricular size is severely enlarged. There is normal pulmonary artery systolic pressure. The estimated right ventricular systolic pressure is 25.9 mmHg.  3. Left atrial size was mildly dilated.  4. Right atrial size was moderately dilated.  5. The mitral valve is normal in structure. Mild mitral valve regurgitation. No evidence of mitral stenosis.  6.  Tricuspid valve regurgitation is moderate to severe.  7. The aortic valve is tricuspid. There is mild calcification of the aortic valve. Aortic valve regurgitation is mild. Aortic valve sclerosis is present, with no evidence of aortic valve stenosis.  8. Aortic dilatation noted. There is  mild dilatation of the aortic root, measuring 37 mm.  9. The inferior vena cava is dilated in size with <50% respiratory variability, suggesting right atrial pressure of 15 mmHg. 10. The patient was in atrial fibrillation. Comparison(s): Left ventricular ejection fraction, by estimation, is 20 to 25%. FINDINGS  Left Ventricle: Left ventricular ejection fraction, by estimation, is 20%. The left ventricle has severely decreased function. The left ventricle demonstrates global hypokinesis. The left ventricular internal cavity size was mildly dilated. There is no left ventricular hypertrophy. Left ventricular diastolic parameters are indeterminate. Right Ventricle: The right ventricular size is severely enlarged. No increase in right ventricular wall thickness. Right ventricular systolic function is moderately reduced. There is normal pulmonary artery systolic pressure. The tricuspid regurgitant velocity is 2.23 m/s, and with an assumed right atrial pressure of 15 mmHg, the estimated right ventricular systolic pressure is 37.6 mmHg. Left Atrium: Left atrial size was mildly dilated. Right Atrium: Right atrial size was moderately dilated. Pericardium: Trivial pericardial effusion is present. Mitral Valve: The mitral valve is normal in structure. Mild mitral valve regurgitation. No evidence of mitral valve stenosis. Tricuspid Valve: The tricuspid valve is normal in structure. Tricuspid valve regurgitation is moderate to severe. Aortic Valve: The aortic valve is tricuspid. There is mild calcification of the aortic valve. Aortic valve regurgitation is mild. Aortic valve sclerosis is present, with no evidence of aortic valve stenosis. Aortic  valve mean gradient measures 1.0 mmHg. Aortic valve peak gradient measures 1.8 mmHg. Aortic valve area, by VTI measures 2.50 cm. Pulmonic Valve: The pulmonic valve was normal in structure. Pulmonic valve regurgitation is trivial. Aorta: Aortic dilatation noted. There is mild dilatation of the aortic root, measuring 37 mm. Venous: The inferior vena cava is dilated in size with less than 50% respiratory variability, suggesting right atrial pressure of 15 mmHg. IAS/Shunts: No atrial level shunt detected by color flow Doppler.  LEFT VENTRICLE PLAX 2D LVIDd:         6.20 cm LVIDs:         5.30 cm LV PW:         1.30 cm LV IVS:        1.10 cm LVOT diam:     2.20 cm      3D Volume EF: LV SV:         28           3D EF:        24 % LV SV Index:   13           LV EDV:       274 ml LVOT Area:     3.80 cm     LV ESV:       207 ml                             LV SV:        66 ml  LV Volumes (MOD) LV vol d, MOD A4C: 191.0 ml LV vol s, MOD A4C: 117.0 ml LV SV MOD A4C:     191.0 ml RIGHT VENTRICLE            IVC RV Mid diam:    5.00 cm    IVC diam: 3.60 cm RV S prime:     8.05 cm/s TAPSE (M-mode): 2.0 cm LEFT ATRIUM           Index        RIGHT ATRIUM  Index LA diam:      4.90 cm 2.25 cm/m   RA Area:     36.60 cm LA Vol (A4C): 47.8 ml 21.95 ml/m  RA Volume:   160.00 ml 73.49 ml/m  AORTIC VALVE                    PULMONIC VALVE AV Area (Vmax):    2.66 cm     PV Vmax:       0.54 m/s AV Area (Vmean):   2.13 cm     PV Peak grad:  1.1 mmHg AV Area (VTI):     2.50 cm AV Vmax:           66.20 cm/s AV Vmean:          54.200 cm/s AV VTI:            0.111 m AV Peak Grad:      1.8 mmHg AV Mean Grad:      1.0 mmHg LVOT Vmax:         46.27 cm/s LVOT Vmean:        30.300 cm/s LVOT VTI:          0.073 m LVOT/AV VTI ratio: 0.66 AR Vena Contracta: 0.50 cm  AORTA Ao Root diam: 3.70 cm Ao Asc diam:  3.20 cm TRICUSPID VALVE TR Peak grad:   19.9 mmHg TR Vmax:        223.00 cm/s  SHUNTS Systemic VTI:  0.07 m Systemic Diam: 2.20 cm  Dalton McleanMD Electronically signed by Franki Monte Signature Date/Time: 07/27/2021/3:38:42 PM    Final    VAS [place] LOWER EXTREMITY VENOUS (DVT)  Result Date: 07/27/2021  Lower Venous DVT Study Patient Name:  Ruben Murray  Date of Exam:   07/27/2021 Medical Rec #: [id number]        Accession #:    [id number] Date of Birth: 07-04-1945        Patient Gender: M Patient Age:   11 years Exam Location:  [facility] Procedure:      VAS [place] LOWER EXTREMITY VENOUS (DVT) Referring Phys: Inda Merlin --------------------------------------------------------------------------------  Indications: Swelling, SOB.  Comparison       12-10-2019 Prior left lower extremity venous was negative for Study:           DVT. Performing Technologist: Darlin Coco RDMS, RVT  Examination Guidelines: A complete evaluation includes B-mode imaging, spectral Doppler, color Doppler, and power Doppler as needed of all accessible portions of each vessel. Bilateral testing is considered an integral part of a complete examination. Limited examinations for reoccurring indications may be performed as noted. The reflux portion of the exam is performed with the patient in reverse Trendelenburg.  +---------+---------------+---------+-----------+----------+--------------+ RIGHT    CompressibilityPhasicitySpontaneityPropertiesThrombus Aging +---------+---------------+---------+-----------+----------+--------------+ CFV      Full           Yes      Yes                                 +---------+---------------+---------+-----------+----------+--------------+ SFJ      Full                                                        +---------+---------------+---------+-----------+----------+--------------+ FV Prox  Full                                                        +---------+---------------+---------+-----------+----------+--------------+  FV Mid   Full                                                         +---------+---------------+---------+-----------+----------+--------------+ FV DistalFull                                                        +---------+---------------+---------+-----------+----------+--------------+ PFV      Full                                                        +---------+---------------+---------+-----------+----------+--------------+ POP      Full           Yes      Yes                                 +---------+---------------+---------+-----------+----------+--------------+ PTV      Full                                                        +---------+---------------+---------+-----------+----------+--------------+ PERO     Full                                                        +---------+---------------+---------+-----------+----------+--------------+ Gastroc  Full                                                        +---------+---------------+---------+-----------+----------+--------------+   +---------+---------------+---------+-----------+----------+--------------+ LEFT     CompressibilityPhasicitySpontaneityPropertiesThrombus Aging +---------+---------------+---------+-----------+----------+--------------+ CFV      Full           Yes      Yes                                 +---------+---------------+---------+-----------+----------+--------------+ SFJ      Full                                                        +---------+---------------+---------+-----------+----------+--------------+ FV Prox  Full                                                        +---------+---------------+---------+-----------+----------+--------------+  FV Mid   Full                                                        +---------+---------------+---------+-----------+----------+--------------+ FV DistalFull                                                         +---------+---------------+---------+-----------+----------+--------------+ PFV      Full                                                        +---------+---------------+---------+-----------+----------+--------------+ POP      Full           Yes      Yes                                 +---------+---------------+---------+-----------+----------+--------------+ PTV      Full                                                        +---------+---------------+---------+-----------+----------+--------------+ PERO     Full                                                        +---------+---------------+---------+-----------+----------+--------------+ Gastroc  Full                                                        +---------+---------------+---------+-----------+----------+--------------+     Summary: RIGHT: - There is no evidence of deep vein thrombosis in the lower extremity.  - No cystic structure found in the popliteal fossa.  LEFT: - There is no evidence of deep vein thrombosis in the lower extremity.  - No cystic structure found in the popliteal fossa.  *See table(s) above for measurements and observations. Electronically signed by Harold Barban MD on 07/27/2021 at 8:54:31 PM.    Final       Subjective: Patient was seen and examined at bedside.  Overnight events noted.  Patient reports feeling much improved and wants to be discharged.  Patient cleared from cardiology.  Discharge Exam: Vitals:   08/01/21 0758 08/01/21 0911  BP:  101/73  Pulse:  82  Resp:    Temp: 97.9 F (36.6 C)   SpO2:     Vitals:   08/01/21 0032 08/01/21 0536 08/01/21 0758 08/01/21 0911  BP:  110/62  101/73  Pulse:  93  82  Resp:  18  Temp: 99.5 F (37.5 C) 97.6 F (36.4 C) 97.9 F (36.6 C)   TempSrc: Oral Oral Oral   SpO2:  95%    Weight:  103.9 kg    Height:        General: Pt is alert, awake, not in acute distress Cardiovascular: RRR, S1/S2 +, no rubs, no  gallops Respiratory: CTA bilaterally, no wheezing, no rhonchi Abdominal: Soft, NT, ND, bowel sounds + Extremities: no edema, no cyanosis    The results of significant diagnostics from this hospitalization (including imaging, microbiology, ancillary and laboratory) are listed below for reference.     Microbiology: Recent Results (from the past 240 hour(s))  Resp Panel by RT-PCR (Flu A&B, Covid) Nasopharyngeal Swab     Status: None   Collection Time: 07/26/21 10:00 PM   Specimen: Nasopharyngeal Swab; Nasopharyngeal(NP) swabs in vial transport medium  Result Value Ref Range Status   SARS Coronavirus 2 by RT PCR NEGATIVE NEGATIVE Final    Comment: (NOTE) SARS-CoV-2 target nucleic acids are NOT DETECTED.  The SARS-CoV-2 RNA is generally detectable in upper respiratory specimens during the acute phase of infection. The lowest concentration of SARS-CoV-2 viral copies this assay can detect is 138 copies/mL. A negative result does not preclude SARS-Cov-2 infection and should not be used as the sole basis for treatment or other patient management decisions. A negative result may occur with  improper specimen collection/handling, submission of specimen other than nasopharyngeal swab, presence of viral mutation(s) within the areas targeted by this assay, and inadequate number of viral copies(<138 copies/mL). A negative result must be combined with clinical observations, patient history, and epidemiological information. The expected result is Negative.  Fact Sheet for Patients:  EntrepreneurPulse.com.au  Fact Sheet for Healthcare Providers:  IncredibleEmployment.be  This test is no t yet approved or cleared by the [place] FDA and  has been authorized for detection and/or diagnosis of SARS-CoV-2 by FDA under an Emergency Use Authorization (EUA). This EUA will remain  in effect (meaning this test can be used) for the duration of the COVID-19  declaration under Section 564(b)(1) of the Act, 21 U.S.C.section 360bbb-3(b)(1), unless the authorization is terminated  or revoked sooner.       Influenza A by PCR NEGATIVE NEGATIVE Final   Influenza B by PCR NEGATIVE NEGATIVE Final    Comment: (NOTE) The Xpert Xpress SARS-CoV-2/FLU/RSV plus assay is intended as an aid in the diagnosis of influenza from Nasopharyngeal swab specimens and should not be used as a sole basis for treatment. Nasal washings and aspirates are unacceptable for Xpert Xpress SARS-CoV-2/FLU/RSV testing.  Fact Sheet for Patients: EntrepreneurPulse.com.au  Fact Sheet for Healthcare Providers: IncredibleEmployment.be  This test is not yet approved or cleared by the [place] FDA and has been authorized for detection and/or diagnosis of SARS-CoV-2 by FDA under an Emergency Use Authorization (EUA). This EUA will remain in effect (meaning this test can be used) for the duration of the COVID-19 declaration under Section 564(b)(1) of the Act, 21 U.S.C. section 360bbb-3(b)(1), unless the authorization is terminated or revoked.  Performed at [facility], [place] [address], [place], [place] [address]      Labs: BNP (last 3 results) Recent Labs    06/29/21 1101 07/15/21 1147 07/26/21 1757  BNP 504.9* 642.4* 563.8*   Basic Metabolic Panel: Recent Labs  Lab 07/26/21 1757 07/27/21 0205 07/27/21 1231 07/28/21 0315 07/29/21 0321 07/30/21 0256 07/31/21 0257 08/01/21 0118  NA 137 139   < > 138 135 133* 137  135  K 3.9 4.2   < > 3.9 3.3* 4.1 4.2 3.9  CL 99 101   < > 99 97* 95* 100 97*  CO2 28 30   < > _0 GLUCOSE 123* 96   < > 100* 149* 107* 119* 120*  BUN 36* 30*   < > 26* 25* 26* 24* 26*  CREATININE 1.73* 1.70*   < > 1.52* 1.58* 1.59* 1.45* 1.60*  CALCIUM 8.6* 8.7*   < > 8.5* 8.3* 8.5* 8.6* 8.5*  MG 2.5* 2.4  --  2.4 2.3  --   --  2.4  PHOS  --   --   --  3.8 3.3  --   --  4.1   < > = values in  this interval not displayed.   Liver Function Tests: Recent Labs  Lab 07/27/21 0205 07/29/21 0321  AST 23 21  ALT 17 16  ALKPHOS 122 115  BILITOT 0.5 0.7  PROT 6.5 6.4*  ALBUMIN 3.4* 3.3*   No results for input(s): LIPASE, AMYLASE in the last 168 hours. No results for input(s): AMMONIA in the last 168 hours. CBC: Recent Labs  Lab 07/27/21 0205 07/27/21 1231 07/28/21 0315 07/29/21 0321 07/30/21 0256 07/31/21 0257 08/01/21 0118  WBC 8.5   < > 8.8 8.1 8.5 11.1* 10.2  NEUTROABS 5.2  --   --   --   --   --   --   HGB 12.4*   < > 12.5* 12.8* 12.3* 12.2* 12.4*  HCT 38.6*   < > 39.1 38.4* 38.1* 37.5* 38.6*  MCV 92.6   < > 92.2 90.4 92.3 91.0 91.7  PLT 134*   < > 144* 154 164 154 162   < > = values in this interval not displayed.   Cardiac Enzymes: No results for input(s): CKTOTAL, CKMB, CKMBINDEX, TROPONINI in the last 168 hours. BNP: Invalid input(s): POCBNP CBG: Recent Labs  Lab 07/29/21 0911  GLUCAP 167*   D-Dimer No results for input(s): DDIMER in the last 72 hours. Hgb A1c No results for input(s): HGBA1C in the last 72 hours. Lipid Profile Recent Labs    07/30/21 0256  CHOL 110  HDL 38*  LDLCALC 59  TRIG 67  CHOLHDL 2.9   Thyroid function studies No results for input(s): TSH, T4TOTAL, T3FREE, THYROIDAB in the last 72 hours.  Invalid input(s): FREET3 Anemia work up No results for input(s): VITAMINB12, FOLATE, FERRITIN, TIBC, IRON, RETICCTPCT in the last 72 hours. Urinalysis    Component Value Date/Time   COLORURINE YELLOW 07/27/2021 0626   APPEARANCEUR CLEAR 07/27/2021 0626   LABSPEC 1.021 07/27/2021 0626   PHURINE 5.0 07/27/2021 0626   GLUCOSEU 150 (A) 07/27/2021 0626   HGBUR NEGATIVE 07/27/2021 0626   BILIRUBINUR NEGATIVE 07/27/2021 0626   KETONESUR NEGATIVE 07/27/2021 0626   PROTEINUR NEGATIVE 07/27/2021 0626   UROBILINOGEN 0.2 05/06/2010 1010   NITRITE NEGATIVE 07/27/2021 0626   LEUKOCYTESUR NEGATIVE 07/27/2021 0626   Sepsis  Labs Invalid input(s): PROCALCITONIN,  WBC,  LACTICIDVEN Microbiology Recent Results (from the past 240 hour(s))  Resp Panel by RT-PCR (Flu A&B, Covid) Nasopharyngeal Swab     Status: None   Collection Time: 07/26/21 10:00 PM   Specimen: Nasopharyngeal Swab; Nasopharyngeal(NP) swabs in vial transport medium  Result Value Ref Range Status   SARS Coronavirus 2 by RT PCR NEGATIVE NEGATIVE Final    Comment: (NOTE) SARS-CoV-2 target nucleic acids are NOT DETECTED.  The SARS-CoV-2 RNA is generally  detectable in upper respiratory specimens during the acute phase of infection. The lowest concentration of SARS-CoV-2 viral copies this assay can detect is 138 copies/mL. A negative result does not preclude SARS-Cov-2 infection and should not be used as the sole basis for treatment or other patient management decisions. A negative result may occur with  improper specimen collection/handling, submission of specimen other than nasopharyngeal swab, presence of viral mutation(s) within the areas targeted by this assay, and inadequate number of viral copies(<138 copies/mL). A negative result must be combined with clinical observations, patient history, and epidemiological information. The expected result is Negative.  Fact Sheet for Patients:  EntrepreneurPulse.com.au  Fact Sheet for Healthcare Providers:  IncredibleEmployment.be  This test is no t yet approved or cleared by the [place] FDA and  has been authorized for detection and/or diagnosis of SARS-CoV-2 by FDA under an Emergency Use Authorization (EUA). This EUA will remain  in effect (meaning this test can be used) for the duration of the COVID-19 declaration under Section 564(b)(1) of the Act, 21 U.S.C.section 360bbb-3(b)(1), unless the authorization is terminated  or revoked sooner.       Influenza A by PCR NEGATIVE NEGATIVE Final   Influenza B by PCR NEGATIVE NEGATIVE Final    Comment:  (NOTE) The Xpert Xpress SARS-CoV-2/FLU/RSV plus assay is intended as an aid in the diagnosis of influenza from Nasopharyngeal swab specimens and should not be used as a sole basis for treatment. Nasal washings and aspirates are unacceptable for Xpert Xpress SARS-CoV-2/FLU/RSV testing.  Fact Sheet for Patients: EntrepreneurPulse.com.au  Fact Sheet for Healthcare Providers: IncredibleEmployment.be  This test is not yet approved or cleared by the [place] FDA and has been authorized for detection and/or diagnosis of SARS-CoV-2 by FDA under an Emergency Use Authorization (EUA). This EUA will remain in effect (meaning this test can be used) for the duration of the COVID-19 declaration under Section 564(b)(1) of the Act, 21 U.S.C. section 360bbb-3(b)(1), unless the authorization is terminated or revoked.  Performed at [facility], [place] [address], [place], [place] [address]      Time coordinating discharge: Over 30 minutes  SIGNED:   Shawna Clamp, MD  [facility] 08/01/2021, 2:44 PM Pager   If 7PM-7AM, please contact night-coverage [email or web address] Password [email or web address]

## 2021-08-01 NOTE — Progress Notes (Signed)
   This pt has been referred to our [facility] program. This is a home-based Palliative care program that is provided by [facility]. We will follow the pt at home after discharge to assist with any chronic/acute symptom management needs. We utilize her PCP as the attending for this program. He will be getting nursing visits 1-3 times a month and SW support in the home with these services. He will also have after hours nursing support as well.    He is eligible for other [facility] services in home with our program in place as well.  Thank you for this referral Webb Silversmith RN [phone]

## 2021-08-01 NOTE — TOC Initial Note (Addendum)
Transition of Care [facility]) - Initial/Assessment Note    Patient Details  Name: Ruben Murray MRN: [id number] Date of Birth: 1945/03/13  Transition of Care [facility]) CM/SW Contact:    Bethena Roys, RN Phone Number: 08/01/2021, 11:33 AM  Clinical Narrative:  Case Manager received a secure chat regarding home health services. Patient states he was active with [facility] called the office; awaiting to hear back from agency. Patient is agreeable to Palliative Services-Case Manager called [facility] and they will follow the patient in the home. Patient states he has a rolling walker in the home and that his PCP is working to get he and his spouse a hospital bed. Case Manager continues to follow.                 08-01-21 Case Manager did receive a call from the [facility] on call Nurse and the agency cannot accept the patient until  the patient shows proof of extermination for bed bugs. Patient is aware to call the exterminator to have the information provided to the agency. Patient will have to go through PCP for [facility] PT/OT orders. Patient calling neighbors for transport home.  Expected Discharge Plan: [place] Barriers to Discharge: No Barriers Identified   Patient Goals and CMS Choice Patient states their goals for this hospitalization and ongoing recovery are:: to return home with home health services.      Expected Discharge Plan and Services Expected Discharge Plan: [place] In-house Referral: NA Discharge Planning Services: CM Consult Post Acute Care Choice: Home Health, Resumption of Svcs/PTA Provider Living arrangements for the past 2 months: Apartment Expected Discharge Date: 08/01/21                 DME Agency: NA       HH Arranged: PT, OT HH Agency: [place] Date [place]: 08/01/21 Time [place]: 65 Representative spoke with at [place]:  Claiborne Billings  Prior Living Arrangements/Services Living arrangements for the past 2 months: [place] with:: Spouse Patient language and need for interpreter reviewed:: Yes Do you feel safe going back to the place where you live?: Yes      Need for Family Participation in Patient Care: Yes (Comment) Care giver support system in place?: Yes (comment) Current home services: Home PT, Home OT Criminal Activity/Legal Involvement Pertinent to Current Situation/Hospitalization: No - Comment as needed  Activities of Daily Living Home Assistive Devices/Equipment: [place] Rile (specify type) ADL Screening (condition at time of admission) Patient's cognitive ability adequate to safely complete daily activities?: Yes Is the patient deaf or have difficulty hearing?: No Does the patient have difficulty seeing, even when wearing glasses/contacts?: No Does the patient have difficulty concentrating, remembering, or making decisions?: No Patient able to express need for assistance with ADLs?: Yes Does the patient have difficulty dressing or bathing?: No Independently performs ADLs?: Yes (appropriate for developmental age) Does the patient have difficulty walking or climbing stairs?: Yes Weakness of Legs: Both Weakness of Arms/Hands: None  Permission Sought/Granted Permission sought to share information with : Facility Sport and exercise psychologist, Tourist information centre manager, Other (comment) Audiological scientist support.)   Emotional Assessment Appearance:: Appears stated age Attitude/Demeanor/Rapport: Engaged Affect (typically observed): Appropriate Orientation: : Oriented to Self, Oriented to Place, Oriented to  Time, Oriented to Situation Alcohol / Substance Use: Not Applicable Psych Involvement: No (comment)  Admission diagnosis:  Acute pulmonary embolism (HCC) [I26.99] AKI (acute kidney injury) ([place]) [  N17.9] Atrial fibrillation with RVR (HCC) [I48.91] Congestive heart failure, unspecified HF chronicity, unspecified heart  failure type ([place]) [I50.9] Acute pulmonary embolism, unspecified pulmonary embolism type, unspecified whether acute cor pulmonale present ([place]) [I26.99] Patient Active Problem List   Diagnosis Date Noted   AKI (acute kidney injury) ([place]) 07/27/2021   Acute pulmonary embolism ([place]) 07/26/2021   Acute on chronic systolic CHF (congestive heart failure) ([place]) 05/17/2021   Hypercoagulable state ([place]) 05/10/2021   Left leg pain 04/24/2021   OSA on CPAP 04/24/2021   Mitral regurgitation 04/24/2021   Tricuspid regurgitation 04/24/2021   Dilated aortic root ([place]) 04/24/2021   Permanent atrial fibrillation ([place]) 04/19/2021   Chronic arthropathy 04/13/2020   Callus 04/13/2020   Onychomycosis 12/12/2019   Left leg cellulitis 76/81/1572   Chronic systolic CHF (congestive heart failure) ([place]) 12/09/2019   Dilated cardiomyopathy ([place])    Atrial flutter ([place]) 11/03/2019   Atrial flutter with rapid ventricular response ([place]) 11/03/2019   Acquired thrombophilia ([place]) 01/15/2019   Atrial fibrillation with rapid ventricular response ([place]) 10/29/2017   Chronic combined systolic and diastolic heart failure ([place]) 10/29/2017   Prolonged QT interval 10/29/2017   Sensorineural hearing loss (SNHL) of both ears 01/31/2017   Dysphonia 10/03/2016   Laryngopharyngeal reflux (LPR) 10/03/2016   Nasal polyps 10/03/2016   Rhinitis medicamentosa 10/03/2016   Chronic laryngitis 10/03/2016   Hoarseness 08/31/2016   Moderate persistent asthma 08/02/2016   COPD (chronic obstructive pulmonary disease) ([place]) 08/02/2016   Morbid (severe) obesity due to excess calories ([place]) 08/02/2016   Community acquired pneumonia 03/06/2016   Essential hypertension 03/06/2016   Mixed hyperlipidemia 03/06/2016   Right thyroid nodule 03/06/2016   Seizures ([place]) 03/06/2016   Fever 03/12/2015   Recurrent erosion of cornea 06/23/2011   PCP:  [name], MD Pharmacy:   [id number] [place]Phone: [phone]: [phone] [facility] [address]Phone: [phone]: [phone] Readmission Risk Interventions    08/01/2021   11:26 AM 12/11/2019    2:03 PM  Readmission Risk Prevention Plan  Transportation Screening Complete Complete  PCP or Specialist Appt within 5-7 Days  Complete  PCP or Specialist Appt within 3-5 Days Complete   Home Care Screening  Complete  Medication Review (RN CM)  Complete  HRI or Home Care Consult Complete   Social Work Consult for RV[place]Planning/Counseling Complete   Palliative Care Screening Complete   Medication Review (Press photographer Complete

## 2021-08-01 NOTE — Progress Notes (Signed)
ANTICOAGULATION CONSULT NOTE - Follow Up Consult  Pharmacy Consult for Warfarin Indication: pulmonary embolus  Allergies  Allergen Reactions   Dilaudid [Hydromorphone Hcl] Other (See Comments)    Makes pt hyper    Hydromorphone Other (See Comments)    hyperactiviity Other reaction(s): made him wild Other reaction(s): Unknown   Morphine Sulfate     Other reaction(s): at high dose causes confusion   Tegretol [Carbamazepine] Hives   Carbamazepine Hives and Rash    Other reaction(s): hives Other reaction(s): Hives    Patient Measurements: Height: _0  (172.7 cm) Weight: 103.9 kg (229 lb) IBW/kg (Calculated) : 68.4 kg  Vital Signs: Temp: 97.9 F (36.6 C) (05/29 0758) Temp Source: Oral (05/29 0758) BP: 110/62 (05/29 0536) Pulse Rate: 93 (05/29 0536)  Labs: Recent Labs    07/30/21 0256 07/31/21 0257 08/01/21 0118  HGB 12.3* 12.2* 12.4*  HCT 38.1* 37.5* 38.6*  PLT 164 154 162  LABPROT 25.5* 23.0* 21.1*  INR 2.3* 2.1* 1.8*  CREATININE 1.59* 1.45* 1.60*     Estimated Creatinine Clearance: 44.5 mL/min (A) (by C-G formula based on SCr of 1.6 mg/dL (H)).   Medications:  Scheduled:   digoxin  0.125 mg Oral Daily   loratadine  10 mg Oral Daily   metoprolol tartrate  100 mg Oral BID   multivitamin with minerals  1 tablet Oral Q1200   phenobarbital  97.2 mg Oral QAC supper   pravastatin  40 mg Oral QHS   torsemide  60 mg Oral Daily   Warfarin - Pharmacist Dosing Inpatient   Does not apply q1600   Infusions:  PRN: acetaminophen **OR** acetaminophen, albuterol, fluticasone, hydrALAZINE, polyethylene glycol  Assessment: 76 yo male presents with atrial fibrillation and found to have bilateral pulmonary emboli. PTA the patient is on warfarin, INR 3.8 on admit. Pharmacy is consulted to dose warfarin.    PTA warfarin regimen: 5 mg daily except 2.5 mg on Mondays   The patient is well controlled on phenobarbital for a history of seizures, which can reduce the efficacy of  DOACs and most sources recommend avoiding DOACs in patients on phenobarbital. Enoxaparin has a cost of $99/month for the patient.    INR is subtherapeutic at 1.8 today, which has continued to trend down over the past few days. There are no documented signs or symptoms of bleeding. There have been no significant diet changes, and no new interacting medications have been initiated. Hgb 12.4, platelets 162.  Goal of Therapy:  INR 2-3 Monitor platelets by anticoagulation protocol: Yes   Plan:  Give warfarin PO 7.5 mg x 1 dose tonight Obtain a daily PT/INR and Q72H CBC Monitor for signs and symptoms of bleeding Watch for drug-drug interactions and significant diet changes   Joseph Art, Pharm.D. PGY-1 Pharmacy Resident [phone] 08/01/2021 8:55 AM

## 2021-08-02 ENCOUNTER — Other Ambulatory Visit (HOSPITAL_COMMUNITY): Payer: Self-pay

## 2021-08-02 NOTE — Progress Notes (Signed)
Paramedicine Encounter    Patient ID: Ruben Murray, male    DOB: 1945-11-06, 76 y.o.   MRN: [id number]  Arrived for home visit for Ragan who arrived home from hospitalization last night around 1800. His neighbor [name] and her husband Annalee Genta picked him up and brought him home ensuring he was fed and has his evening and bed time medications.   Today he is seated in his recliner alert and oriented reporting some mild pain in his feet from "gout". He has some mild swelling in the left foot and ankle.   Todays weight is down 3 lbs from last week. Lungs are clear. HR is irregular on palpation but between 80-105. He denied chest pain, dizziness or shortness of breath. He reports feeling much better today.   I reviewed all notes from hospital stay and reviewed medications and confirmed same. I filled pill box for one week.  TORSEMIDE DOSE IS NOW 60MG DAILY. POTASSIUM IS 20MEQ DAILY.  I reviewed upcoming appointments and confirmed same as well and wrote down reminders for same. He sees PCP on 6/6 at 1130. His neighbor called PCP today and requested hospital bed for him as he is having a hard time sleeping in his bed being unable to elevate his legs or his head so he sleeps in the recliner.   HH was referred for patient through hospital to [facility] however they are refusing to come out until they get a letter reporting there are no longer any bed bugs in the residence. [name], Mr. Grundman neighbor is aware and is working through this. I also made her and the patient aware that PT and OT orders have to come from PCP. [name] plans on taking Mr. Misner to his PCP visit so she can relay this information.    Refills needed from [facility]: Jardiance Vit D Digoxin   [name] plans to pick these up today for him.   Home visit complete. I plan to see [name] in one week, he knows if he has any urgent symptoms to call 911 and to me.   [name],  [place] 08/02/2021             Patient Care Team: [name], MD as PCP - General (Family Medicine) [name], MD as PCP - Cardiology (Cardiology) [name], MD as PCP - Electrophysiology (Cardiology) [name], MD (Family Medicine) [place] as Case Manager (Hospice and Palliative Medicine)  Patient Active Problem List   Diagnosis Date Noted   AKI (acute kidney injury) ([place]) 07/27/2021   Acute pulmonary embolism ([place]) 07/26/2021   Acute on chronic systolic CHF (congestive heart failure) ([place]) 05/17/2021   Hypercoagulable state ([place]) 05/10/2021   Left leg pain 04/24/2021   OSA on CPAP 04/24/2021   Mitral regurgitation 04/24/2021   Tricuspid regurgitation 04/24/2021   Dilated aortic root ([place]) 04/24/2021   Permanent atrial fibrillation ([place]) 04/19/2021   Chronic arthropathy 04/13/2020   Callus 04/13/2020   Onychomycosis 12/12/2019   Left leg cellulitis 65/78/4696   Chronic systolic CHF (congestive heart failure) ([place]) 12/09/2019   Dilated cardiomyopathy ([place])    Atrial flutter ([place]) 11/03/2019   Atrial flutter with rapid ventricular response ([place]) 11/03/2019   Acquired thrombophilia ([place]) 01/15/2019   Atrial fibrillation with rapid ventricular response ([place]) 10/29/2017   Chronic combined systolic and diastolic heart failure ([place]) 10/29/2017   Prolonged QT interval 10/29/2017   Sensorineural hearing loss (SNHL) of both ears 01/31/2017   Dysphonia 10/03/2016  Laryngopharyngeal reflux (LPR) 10/03/2016   Nasal polyps 10/03/2016   Rhinitis medicamentosa 10/03/2016   Chronic laryngitis 10/03/2016   Hoarseness 08/31/2016   Moderate persistent asthma 08/02/2016   COPD (chronic obstructive pulmonary disease) ([place]) 08/02/2016   Morbid (severe) obesity due to excess calories ([place]) 08/02/2016   Community acquired pneumonia 03/06/2016   Essential hypertension 03/06/2016   Mixed hyperlipidemia 03/06/2016   Right  thyroid nodule 03/06/2016   Seizures ([place]) 03/06/2016   Fever 03/12/2015   Recurrent erosion of cornea 06/23/2011    Current Outpatient Medications:    acetaminophen (TYLENOL) 500 MG tablet, Take 1,000 mg by mouth at bedtime as needed for mild pain or headache., Disp: , Rfl:    Carboxymethylcellulose Sod PF 0.5 % SOLN, Place 1 drop into both eyes 4 (four) times daily as needed (dry eyes)., Disp: , Rfl:    Cholecalciferol (VITAMIN D3) 25 MCG (1000 UT) CAPS, Take 1,000 Units by mouth daily., Disp: , Rfl:    digoxin (LANOXIN) 0.125 MG tablet, Take 1 tablet (0.125 mg total) by mouth daily., Disp: 30 tablet, Rfl: 1   empagliflozin (JARDIANCE) 10 MG TABS tablet, Take 1 tablet (10 mg total) by mouth daily before breakfast., Disp: 90 tablet, Rfl: 3   escitalopram (LEXAPRO) 20 MG tablet, Take 20 mg by mouth daily. Takes at bedtime., Disp: , Rfl:    finasteride (PROSCAR) 5 MG tablet, Take 5 mg by mouth daily at 12 noon. , Disp: , Rfl:    fluticasone (FLONASE) 50 MCG/ACT nasal spray, Place 1 spray into both nostrils 2 (two) times daily., Disp: , Rfl:    loratadine (CLARITIN) 10 MG tablet, Take 10 mg by mouth daily., Disp: , Rfl:    metoprolol tartrate (LOPRESSOR) 100 MG tablet, Take 1 tablet (100 mg total) by mouth 2 (two) times daily., Disp: 180 tablet, Rfl: 2   Multiple Vitamin (MULTIVITAMIN WITH MINERALS) TABS tablet, Take 1 tablet by mouth daily at 12 noon., Disp: , Rfl:    PHENobarbital (LUMINAL) 97.2 MG tablet, Take 97.2 mg by mouth daily. Takes between 3:00-5:00pm., Disp: , Rfl:    potassium chloride SA (KLOR-CON M) 20 MEQ tablet, Take 1 tablet (20 mEq total) by mouth daily., Disp: 90 tablet, Rfl: 3   pravastatin (PRAVACHOL) 40 MG tablet, Take 40 mg by mouth at bedtime. , Disp: , Rfl:    spironolactone (ALDACTONE) 25 MG tablet, Take 0.5 tablets (12.5 mg total) by mouth daily., Disp: 45 tablet, Rfl: 2   torsemide 60 MG TABS, Take 60 mg by mouth daily., Disp: 30 tablet, Rfl: 1   triamcinolone cream  (KENALOG) 0.1 %, Apply 1 application. topically 2 (two) times daily as needed (rash)., Disp: , Rfl:    warfarin (COUMADIN) 5 MG tablet, Take 2.5-5 mg by mouth See admin instructions. Taking 2.5 mg ( 1/2 tablet) on Monday only. All other days of the week taking 5 mg., Disp: , Rfl:  Allergies  Allergen Reactions   Dilaudid [Hydromorphone Hcl] Other (See Comments)    Makes pt hyper    Hydromorphone Other (See Comments)    hyperactiviity Other reaction(s): made him wild Other reaction(s): Unknown   Morphine Sulfate     Other reaction(s): at high dose causes confusion   Tegretol [Carbamazepine] Hives   Carbamazepine Hives and Rash    Other reaction(s): hives Other reaction(s): Hives     Social History   Socioeconomic History   Marital status: Married    Spouse name: Not on file   Number of children: Not  on file   Years of education: Not on file   Highest education level: Not on file  Occupational History   Not on file  Tobacco Use   Smoking status: Former    Packs/day: 3.00    Years: 48.00    Pack years: 144.00    Types: Cigarettes    Quit date: 2000    Years since quitting: 23.4   Smokeless tobacco: Never  Vaping Use   Vaping Use: Never used  Substance and Sexual Activity   Alcohol use: Never   Drug use: Never   Sexual activity: Never  Other Topics Concern   Not on file  Social History Narrative   ** Merged History Encounter **       Social Determinants of Health   Financial Resource Strain: Not on file  Food Insecurity: No Food Insecurity   Worried About Charity fundraiser in the Last Year: Never true   [place] in the Last Year: Never true  Transportation Needs: No Transportation Needs   Lack of Transportation (Medical): No   Lack of Transportation (Non-Medical): No  Physical Activity: Not on file  Stress: Not on file  Social Connections: Not on file  Intimate Partner Violence: Not on file    Physical Exam      Future Appointments  Date  Time Provider [place]  08/16/2021 11:00 AM [facility] PA/NP [facility] None  09/27/2021 11:20 AM [place], PA-C [facility]  10/24/2021 11:00 AM Bensimhon, Shaune Pascal, MD [facility] None  11/14/2021  3:00 PM [name], MD [facility]     ACTION: Home visit completed

## 2021-08-09 ENCOUNTER — Other Ambulatory Visit (HOSPITAL_COMMUNITY): Payer: Self-pay

## 2021-08-09 NOTE — Progress Notes (Signed)
Paramedicine Encounter    Patient ID: Ruben Murray, male    DOB: 11-15-1945, 76 y.o.   MRN: [id number]   Arrived for home visit for Ruben Murray who was seated in his chair alert and oriented reporting to be feeling well with no complaints. He has been med compliant over the last week and denied chest pain, dizziness, falls or increased shortness of breath.   Vitals and assessment obtained. No lower leg edema noted. Lung sounds clear. HR regular on palpation.   Meds reviewed and confirmed. Pill box filled accordingly.  (Needs digoxin and finasteride for pill box)   We reviewed upcoming appointments and confirmed same.   He plans to see his PCP today.   Home visit complete. I will follow up next week.      Patient Care Team: [name], MD as PCP - General (Family Medicine) [name], MD as PCP - Cardiology (Cardiology) [name], MD as PCP - Electrophysiology (Cardiology) [name], MD (Family Medicine) [place] as Case Manager (Hospice and Palliative Medicine)  Patient Active Problem List   Diagnosis Date Noted   AKI (acute kidney injury) ([place]) 07/27/2021   Acute pulmonary embolism ([place]) 07/26/2021   Acute on chronic systolic CHF (congestive heart failure) ([place]) 05/17/2021   Hypercoagulable state ([place]) 05/10/2021   Left leg pain 04/24/2021   OSA on CPAP 04/24/2021   Mitral regurgitation 04/24/2021   Tricuspid regurgitation 04/24/2021   Dilated aortic root ([place]) 04/24/2021   Permanent atrial fibrillation ([place]) 04/19/2021   Chronic arthropathy 04/13/2020   Callus 04/13/2020   Onychomycosis 12/12/2019   Left leg cellulitis 17/98/1025   Chronic systolic CHF (congestive heart failure) ([place]) 12/09/2019   Dilated cardiomyopathy ([place])    Atrial flutter ([place]) 11/03/2019   Atrial flutter with rapid ventricular response ([place]) 11/03/2019   Acquired thrombophilia ([place]) 01/15/2019   Atrial fibrillation with rapid ventricular response ([place])  10/29/2017   Chronic combined systolic and diastolic heart failure ([place]) 10/29/2017   Prolonged QT interval 10/29/2017   Sensorineural hearing loss (SNHL) of both ears 01/31/2017   Dysphonia 10/03/2016   Laryngopharyngeal reflux (LPR) 10/03/2016   Nasal polyps 10/03/2016   Rhinitis medicamentosa 10/03/2016   Chronic laryngitis 10/03/2016   Hoarseness 08/31/2016   Moderate persistent asthma 08/02/2016   COPD (chronic obstructive pulmonary disease) ([place]) 08/02/2016   Morbid (severe) obesity due to excess calories ([place]) 08/02/2016   Community acquired pneumonia 03/06/2016   Essential hypertension 03/06/2016   Mixed hyperlipidemia 03/06/2016   Right thyroid nodule 03/06/2016   Seizures ([place]) 03/06/2016   Fever 03/12/2015   Recurrent erosion of cornea 06/23/2011    Current Outpatient Medications:    acetaminophen (TYLENOL) 500 MG tablet, Take 1,000 mg by mouth at bedtime as needed for mild pain or headache., Disp: , Rfl:    Carboxymethylcellulose Sod PF 0.5 % SOLN, Place 1 drop into both eyes 4 (four) times daily as needed (dry eyes)., Disp: , Rfl:    Cholecalciferol (VITAMIN D3) 25 MCG (1000 UT) CAPS, Take 1,000 Units by mouth daily., Disp: , Rfl:    digoxin (LANOXIN) 0.125 MG tablet, Take 1 tablet (0.125 mg total) by mouth daily., Disp: 30 tablet, Rfl: 1   empagliflozin (JARDIANCE) 10 MG TABS tablet, Take 1 tablet (10 mg total) by mouth daily before breakfast., Disp: 90 tablet, Rfl: 3   escitalopram (LEXAPRO) 20 MG tablet, Take 20 mg by mouth daily. Takes at bedtime., Disp: , Rfl:    finasteride (PROSCAR) 5 MG  tablet, Take 5 mg by mouth daily at 12 noon. , Disp: , Rfl:    fluticasone (FLONASE) 50 MCG/ACT nasal spray, Place 1 spray into both nostrils 2 (two) times daily., Disp: , Rfl:    loratadine (CLARITIN) 10 MG tablet, Take 10 mg by mouth daily., Disp: , Rfl:    metoprolol tartrate (LOPRESSOR) 100 MG tablet, Take 1 tablet (100 mg total) by mouth 2 (two) times daily., Disp: 180 tablet,  Rfl: 2   Multiple Vitamin (MULTIVITAMIN WITH MINERALS) TABS tablet, Take 1 tablet by mouth daily at 12 noon., Disp: , Rfl:    PHENobarbital (LUMINAL) 97.2 MG tablet, Take 97.2 mg by mouth daily. Takes between 3:00-5:00pm., Disp: , Rfl:    potassium chloride SA (KLOR-CON M) 20 MEQ tablet, Take 1 tablet (20 mEq total) by mouth daily., Disp: 90 tablet, Rfl: 3   pravastatin (PRAVACHOL) 40 MG tablet, Take 40 mg by mouth at bedtime. , Disp: , Rfl:    spironolactone (ALDACTONE) 25 MG tablet, Take 0.5 tablets (12.5 mg total) by mouth daily., Disp: 45 tablet, Rfl: 2   torsemide 60 MG TABS, Take 60 mg by mouth daily., Disp: 30 tablet, Rfl: 1   triamcinolone cream (KENALOG) 0.1 %, Apply 1 application. topically 2 (two) times daily as needed (rash). (Patient not taking: Reported on 08/02/2021), Disp: , Rfl:    warfarin (COUMADIN) 5 MG tablet, Take 2.5-5 mg by mouth See admin instructions. Taking 2.5 mg ( 1/2 tablet) on Monday only. All other days of the week taking 5 mg., Disp: , Rfl:  Allergies  Allergen Reactions   Dilaudid [Hydromorphone Hcl] Other (See Comments)    Makes pt hyper    Hydromorphone Other (See Comments)    hyperactiviity Other reaction(s): made him wild Other reaction(s): Unknown   Morphine Sulfate     Other reaction(s): at high dose causes confusion   Tegretol [Carbamazepine] Hives   Carbamazepine Hives and Rash    Other reaction(s): hives Other reaction(s): Hives     Social History   Socioeconomic History   Marital status: Married    Spouse name: Not on file   Number of children: Not on file   Years of education: Not on file   Highest education level: Not on file  Occupational History   Not on file  Tobacco Use   Smoking status: Former    Packs/day: 3.00    Years: 48.00    Pack years: 144.00    Types: Cigarettes    Quit date: 2000    Years since quitting: 23.4   Smokeless tobacco: Never  Vaping Use   Vaping Use: Never used  Substance and Sexual Activity    Alcohol use: Never   Drug use: Never   Sexual activity: Never  Other Topics Concern   Not on file  Social History Narrative   ** Merged History Encounter **       Social Determinants of Health   Financial Resource Strain: Not on file  Food Insecurity: No Food Insecurity   Worried About Charity fundraiser in the Last Year: Never true   [place] in the Last Year: Never true  Transportation Needs: No Transportation Needs   Lack of Transportation (Medical): No   Lack of Transportation (Non-Medical): No  Physical Activity: Not on file  Stress: Not on file  Social Connections: Not on file  Intimate Partner Violence: Not on file    Physical Exam      Future Appointments  Date  Time Provider [place]  08/16/2021 11:00 AM [facility] PA/NP [facility] None  09/27/2021 11:20 AM Baldwin [place], PA-C [facility]  10/24/2021 11:00 AM Bensimhon, Shaune Pascal, MD [facility] None  11/14/2021  3:00 PM [name], MD [facility]     ACTION: Home visit completed

## 2021-08-11 DIAGNOSIS — Z66 Do not resuscitate: Secondary | ICD-10-CM | POA: Diagnosis not present

## 2021-08-11 DIAGNOSIS — E7800 Pure hypercholesterolemia, unspecified: Secondary | ICD-10-CM | POA: Diagnosis not present

## 2021-08-11 DIAGNOSIS — D649 Anemia, unspecified: Secondary | ICD-10-CM | POA: Diagnosis not present

## 2021-08-11 DIAGNOSIS — I10 Essential (primary) hypertension: Secondary | ICD-10-CM | POA: Diagnosis not present

## 2021-08-11 DIAGNOSIS — I480 Paroxysmal atrial fibrillation: Secondary | ICD-10-CM | POA: Diagnosis not present

## 2021-08-11 DIAGNOSIS — Z742 Need for assistance at home and no other household member able to render care: Secondary | ICD-10-CM | POA: Diagnosis not present

## 2021-08-11 DIAGNOSIS — R7303 Prediabetes: Secondary | ICD-10-CM | POA: Diagnosis not present

## 2021-08-11 DIAGNOSIS — Z9289 Personal history of other medical treatment: Secondary | ICD-10-CM | POA: Diagnosis not present

## 2021-08-11 DIAGNOSIS — G40909 Epilepsy, unspecified, not intractable, without status epilepticus: Secondary | ICD-10-CM | POA: Diagnosis not present

## 2021-08-15 ENCOUNTER — Telehealth (HOSPITAL_COMMUNITY): Payer: Self-pay

## 2021-08-15 NOTE — Telephone Encounter (Signed)
Called to remind Ruben Murray of his appointment tomorrow in the clinic at 11:00 and to bring all meds and pill box. I plan to meet him there. He agreed and call complete.   [name], [place] 08/15/2021

## 2021-08-16 ENCOUNTER — Other Ambulatory Visit (HOSPITAL_COMMUNITY): Payer: Self-pay

## 2021-08-16 ENCOUNTER — Ambulatory Visit (HOSPITAL_COMMUNITY)
Admission: RE | Admit: 2021-08-16 | Discharge: 2021-08-16 | Disposition: A | Discharge status: Home / Self Care | Payer: Medicare PPO | Source: Ambulatory Visit | Attending: Family Medicine | Admitting: Family Medicine

## 2021-08-16 ENCOUNTER — Encounter (HOSPITAL_COMMUNITY): Payer: Self-pay

## 2021-08-16 VITALS — BP 112/70 | HR 71 | Wt 232.8 lb

## 2021-08-16 DIAGNOSIS — G4733 Obstructive sleep apnea (adult) (pediatric): Secondary | ICD-10-CM | POA: Diagnosis not present

## 2021-08-16 DIAGNOSIS — I4819 Other persistent atrial fibrillation: Secondary | ICD-10-CM | POA: Diagnosis not present

## 2021-08-16 DIAGNOSIS — Z79899 Other long term (current) drug therapy: Secondary | ICD-10-CM | POA: Insufficient documentation

## 2021-08-16 DIAGNOSIS — Z7901 Long term (current) use of anticoagulants: Secondary | ICD-10-CM | POA: Diagnosis not present

## 2021-08-16 DIAGNOSIS — I428 Other cardiomyopathies: Secondary | ICD-10-CM | POA: Diagnosis not present

## 2021-08-16 DIAGNOSIS — Z7984 Long term (current) use of oral hypoglycemic drugs: Secondary | ICD-10-CM | POA: Diagnosis not present

## 2021-08-16 DIAGNOSIS — I272 Pulmonary hypertension, unspecified: Secondary | ICD-10-CM | POA: Insufficient documentation

## 2021-08-16 DIAGNOSIS — I2699 Other pulmonary embolism without acute cor pulmonale: Secondary | ICD-10-CM | POA: Insufficient documentation

## 2021-08-16 DIAGNOSIS — I081 Rheumatic disorders of both mitral and tricuspid valves: Secondary | ICD-10-CM | POA: Insufficient documentation

## 2021-08-16 DIAGNOSIS — Z8669 Personal history of other diseases of the nervous system and sense organs: Secondary | ICD-10-CM | POA: Diagnosis not present

## 2021-08-16 DIAGNOSIS — I110 Hypertensive heart disease with heart failure: Secondary | ICD-10-CM | POA: Diagnosis not present

## 2021-08-16 DIAGNOSIS — Z91148 Patient's other noncompliance with medication regimen for other reason: Secondary | ICD-10-CM | POA: Insufficient documentation

## 2021-08-16 DIAGNOSIS — Z7902 Long term (current) use of antithrombotics/antiplatelets: Secondary | ICD-10-CM | POA: Insufficient documentation

## 2021-08-16 DIAGNOSIS — J449 Chronic obstructive pulmonary disease, unspecified: Secondary | ICD-10-CM | POA: Insufficient documentation

## 2021-08-16 DIAGNOSIS — I5042 Chronic combined systolic (congestive) and diastolic (congestive) heart failure: Secondary | ICD-10-CM | POA: Diagnosis not present

## 2021-08-16 DIAGNOSIS — I4510 Unspecified right bundle-branch block: Secondary | ICD-10-CM | POA: Insufficient documentation

## 2021-08-16 DIAGNOSIS — I10 Essential (primary) hypertension: Secondary | ICD-10-CM

## 2021-08-16 DIAGNOSIS — I5022 Chronic systolic (congestive) heart failure: Secondary | ICD-10-CM | POA: Insufficient documentation

## 2021-08-16 DIAGNOSIS — I4821 Permanent atrial fibrillation: Secondary | ICD-10-CM | POA: Insufficient documentation

## 2021-08-16 DIAGNOSIS — I2694 Multiple subsegmental pulmonary emboli without acute cor pulmonale: Secondary | ICD-10-CM | POA: Diagnosis not present

## 2021-08-16 LAB — CBC
HCT: 40.9 % (ref 39.0–52.0)
Hemoglobin: 13 g/dL (ref 13.0–17.0)
MCH: 29.5 pg (ref 26.0–34.0)
MCHC: 31.8 g/dL (ref 30.0–36.0)
MCV: 93 fL (ref 80.0–100.0)
Platelets: 225 10*3/uL (ref 150–400)
RBC: 4.4 MIL/uL (ref 4.22–5.81)
RDW: 16.9 % — ABNORMAL HIGH (ref 11.5–15.5)
WBC: 6.5 10*3/uL (ref 4.0–10.5)
nRBC: 0 % (ref 0.0–0.2)

## 2021-08-16 LAB — BASIC METABOLIC PANEL
Anion gap: 8 (ref 5–15)
BUN: 33 mg/dL — ABNORMAL HIGH (ref 8–23)
CO2: 33 mmol/L — ABNORMAL HIGH (ref 22–32)
Calcium: 8.9 mg/dL (ref 8.9–10.3)
Chloride: 101 mmol/L (ref 98–111)
Creatinine, Ser: 1.41 mg/dL — ABNORMAL HIGH (ref 0.61–1.24)
GFR, Estimated: 51 mL/min — ABNORMAL LOW (ref >60)
Glucose, Bld: 94 mg/dL (ref 70–99)
Potassium: 4.2 mmol/L (ref 3.5–5.1)
Sodium: 142 mmol/L (ref 135–145)

## 2021-08-16 LAB — PROTIME-INR
INR: 2.3 — ABNORMAL HIGH (ref 0.8–1.2)
Prothrombin Time: 25.3 seconds — ABNORMAL HIGH (ref 11.4–15.2)

## 2021-08-16 MED ORDER — DIGOXIN 125 MCG PO TABS: 0.1250 mg | ORAL_TABLET | Freq: Every day | ORAL | 3 refills | Status: DC

## 2021-08-16 MED ORDER — DIGOXIN 125 MCG PO TABS: 0.1250 mg | ORAL_TABLET | Freq: Every day | ORAL | 1 refills | Status: DC

## 2021-08-16 MED ORDER — LOSARTAN POTASSIUM 25 MG PO TABS: 12.5000 mg | ORAL_TABLET | Freq: Every day | ORAL | 3 refills | Status: DC

## 2021-08-16 NOTE — Patient Instructions (Signed)
START Losartan 12.5 mg (one half) tab daily START Digoxin 0.125 mg, one tab daily  Labs today We will only contact you if something comes back abnormal or we need to make some changes. Otherwise no news is good news!  Labs needed in 10 days -be sure to hold your DIGOXIN the morning of your appointment  Keep follow up as scheduled  Do the following things EVERYDAY: Weigh yourself in the morning before breakfast. Write it down and keep it in a log. Take your medicines as prescribed Eat low salt foods--Limit salt (sodium) to 2000 mg per day.  Stay as active as you can everyday Limit all fluids for the day to less than 2 liters  At the [facility], you and your health needs are our priority. As part of our continuing mission to provide you with exceptional heart care, we have created designated Provider Care Teams. These Care Teams include your primary Cardiologist (physician) and Advanced Practice Providers (APPs- Physician Assistants and Nurse Practitioners) who all work together to provide you with the care you need, when you need it.   You may see any of the following providers on your designated Care Team at your next follow up: Dr Glori Bickers Dr Haynes Kerns, NP Lyda Jester, [place] [facility], [place] Audry Riles, PharmD   Please be sure to bring in all your medications bottles to every appointment.   If you have any questions or concerns before your next appointment please send [place] a message through [place] or call our office at [phone].    TO LEAVE A MESSAGE FOR THE NURSE SELECT OPTION 2, PLEASE LEAVE A MESSAGE INCLUDING: YOUR NAME DATE OF BIRTH CALL BACK NUMBER REASON FOR CALL**this is important as we prioritize the call backs  YOU WILL RECEIVE A CALL BACK THE SAME DAY AS LONG AS YOU CALL BEFORE 4:00 PM

## 2021-08-16 NOTE — Progress Notes (Signed)
Paramedicine Encounter    Patient ID: Ruben Murray, male    DOB: April 15, 1945, 76 y.o.   MRN: [id number]    Arrived to clinic for visit with Ruben Murray being seen by NP [facility]. [name] meds were verified. Vitals obtained. EKG obtained and Labs obtained.   Med changes today:  Add Losartan 1/2 tablet in the mornings  Restart Digoxin 1 tablet in the mornings  INR sent to PCP  I will follow up in the home next week  [id number] BP- 112/70 HR- 79  LABS on  6/23 at 1130      ACTION: Home visit completed

## 2021-08-16 NOTE — Addendum Note (Signed)
Encounter addended by: Rafael Bihari, FNP on: 08/16/2021 1:37 PM  Actions taken: Clinical Note Signed

## 2021-08-16 NOTE — Progress Notes (Addendum)
Advanced Heart Failure Clinic Note   Primary Care: [name], MD HF Cardiologist: Dr. Haroldine Laws  HPI: Ruben Murray is a 76 y.o.with a h/o chronic HFrEF, NICM, permanent A fib, pulmonary JF, mild Ruben, mild-mod TR, htn, osa, seizure, COPD , HTN, and RBBB.    Lost to cardiology f/u 2022.    Admitted  04/2021 for a/c CHF in the setting of rapid afib. Echo showed EF 20-25%, RV mildly reduced, RVSP 48 mmHg, mild Ruben, mild TR. He was diuresed w/ IV Lasix. Afib treated w/ rate control, w/ metoprolol and digoxin. Amiodarone discontinued given chronic afib. V-rate improved in the 90s.  After diuresis, he was transition to PO torsemide. GDMT added. Instructed to f/u w/ EP post hospital and referred to [facility] clinic.  Wt charted at 227 lb.    He was seen by Dr. Curt Bears . Plan is for CRT-D implant w/ plans to consider AV node ablation if further issues w/ rapid afib.    Had initial TOC visit 05/04/2021. HF meds adjusted, but he did not start them.    Admitted 05/17/21 with AF RVR and A/C HFrEF.  Prior to admit he never picked up digoxin, Toprol, or Jardiance. Diuresed with IV lasix.  Discharged on lopressor 100 bid, digoxin 0.25 , and torsemide 40 mg bid. Discharge weight 214 pounds.   Seen in [facility]23, volume trending up. Torsemide increased and spiro started. Referred to paramedicine.  Follow up 5/23 in AHF clinic, NYHA III, volume low and torsemide decreased back to 40 bid.  Readmitted 5/23 with AFib with RVR, HR 150's, INR 3.8. CTA chest with small PE w/ R heart strain, also with volume overload. Started on heparin gtt and warfarin continued. Echo with EF 20%. Cards consulted, given IV lasix and able to eventually transition to torsemide 60 daily. No DOAC due to hx of seizures and phenobarbital use.  AFib controlled with metoprolol 100 bid and digoxin (dig previously stopped due to elevated level). PMT consulted for [place], and he remained DNR. He was discharged home with HH, weight 228 lbs.  Today he  returns for HF follow up with his neighbor and paramedic, Heather. Overall feeling fine. Feels occasional palpitations and he is SOB walking further distances on flat ground. Denies abnormal bleeding, CP, dizziness, edema, or PND/Orthopnea. Appetite ok. No fever or chills. Weight at home 233 pounds. Taking all medications. No longer drives and needs transportation assistance. Lives with his wife who has dementia and requires 24 hour assistance. Home Health following. Has been out of digoxin for several days.  Cardiac Testing  - Echo 2/18 EF 40-45%, RV normal, mod TR - Echo 8/19 EF 25%, RV moderately reduced (in Afib) - Echo 1/20 EF 40-45%, RV mildly dilated, systolic fx normal  - Echo 8/21 EF 20-25%, RV moderately reduced, RVSP 38, Mild Ruben, mild TR - LHC 9/21 Normal coronaries, RHC mild pulmonary hypertension; PA 34/22, mean PA pressure 28 mmHg  - Echo 2/23 EF 20-25%, RV mildly reduced, RVSP 48, mild Ruben, mild TR  - Echo 5/23: EF 20%, RV moderately down, moderate to severe TR, mild Ruben (he was in AF and volume overloaded).  Past Medical History:  Diagnosis Date   Acquired thrombophilia ([place]) 01/15/2019   Aortic atherosclerosis ([place])    Aortic root dilatation (HCC)    CAP (community acquired pneumonia) 03/06/2016   Chronic laryngitis 92/01/9416   Chronic systolic CHF (congestive heart failure) ([place])    COPD GOLD II  08/02/2016   Quit smoking 2000  PFT's  07/10/2016  FEV1 1.98 (70 % ) ratio 67  p 19 % improvement from saba p nothing  prior to study while of coreg so rec as of 08/02/2016 try off coreg and on bisoprolol      Essential hypertension 03/06/2016   Changed from coreg to bisoprolol due to copd with reversible component  08/02/2016 >>>    Hoarseness 08/31/2016   Referred to ent 08/31/2016 >>> seen 10/03/16 [name] dx gerd and rhinitis medicamentosa   > improved on f/u 01/31/17 on gerd rx/ flonase and off afrin   Hyperlipidemia    Hypertension    Laryngopharyngeal reflux (LPR) 10/03/2016    Mitral regurgitation    Moderate persistent asthma 08/02/2016   Morbid (severe) obesity due to excess calories ([place]) 08/02/2016   Nasal polyps 10/03/2016   NICM (nonischemic cardiomyopathy) ([place])    a. EF 40-45% in 04/2016, etiology not defined, managed medically.   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   Rhinitis medicamentosa 10/03/2016   Right thyroid nodule 03/06/2016   Seizures ([place])    "take RX daily" (03/06/2016)   Sensorineural hearing loss (SNHL) of both ears 01/31/2017   Tricuspid regurgitation    Current Outpatient Medications  Medication Sig Dispense Refill   acetaminophen (TYLENOL) 500 MG tablet Take 1,000 mg by mouth at bedtime as needed for mild pain or headache.     Carboxymethylcellulose Sod PF 0.5 % SOLN Place 1 drop into both eyes 4 (four) times daily as needed (dry eyes).     Cholecalciferol (VITAMIN D3) 25 MCG (1000 UT) CAPS Take 1,000 Units by mouth daily.     digoxin (LANOXIN) 0.125 MG tablet Take 1 tablet (0.125 mg total) by mouth daily. 30 tablet 1   empagliflozin (JARDIANCE) 10 MG TABS tablet Take 1 tablet (10 mg total) by mouth daily before breakfast. 90 tablet 3   escitalopram (LEXAPRO) 20 MG tablet Take 20 mg by mouth daily. Takes at bedtime.     finasteride (PROSCAR) 5 MG tablet Take 5 mg by mouth daily at 12 noon.      fluticasone (FLONASE) 50 MCG/ACT nasal spray Place 1 spray into both nostrils 2 (two) times daily.     loratadine (CLARITIN) 10 MG tablet Take 10 mg by mouth daily.     metoprolol tartrate (LOPRESSOR) 100 MG tablet Take 1 tablet (100 mg total) by mouth 2 (two) times daily. 180 tablet 2   Multiple Vitamin (MULTIVITAMIN WITH MINERALS) TABS tablet Take 1 tablet by mouth daily at 12 noon.     PHENobarbital (LUMINAL) 97.2 MG tablet Take 97.2 mg by mouth daily. Takes between 3:00-5:00pm.     potassium chloride SA (KLOR-CON M) 20 MEQ tablet Take 1  tablet (20 mEq total) by mouth daily. 90 tablet 3   pravastatin (PRAVACHOL) 40 MG tablet Take 40 mg by mouth at bedtime.      spironolactone (ALDACTONE) 25 MG tablet Take 0.5 tablets (12.5 mg total) by mouth daily. 45 tablet 2   torsemide 60 MG TABS Take 60 mg by mouth daily. 30 tablet 1   triamcinolone cream (KENALOG) 0.1 % Apply 1 application. topically 2 (two) times daily as needed (rash).     warfarin (COUMADIN) 5 MG tablet Take 2.5-5 mg by mouth See admin instructions. Taking 2.5 mg ( 1/2 tablet) on Monday only. All other days of the week taking 5 mg.  No current facility-administered medications for this encounter.   Allergies  Allergen Reactions   Dilaudid [Hydromorphone Hcl] Other (See Comments)    Makes pt hyper    Hydromorphone Other (See Comments)    hyperactiviity Other reaction(s): made him wild Other reaction(s): Unknown   Morphine Sulfate     Other reaction(s): at high dose causes confusion   Tegretol [Carbamazepine] Hives   Carbamazepine Hives and Rash    Other reaction(s): hives Other reaction(s): Hives   Social History   Socioeconomic History   Marital status: Married    Spouse name: Not on file   Number of children: Not on file   Years of education: Not on file   Highest education level: Not on file  Occupational History   Not on file  Tobacco Use   Smoking status: Former    Packs/day: 3.00    Years: 48.00    Total pack years: 144.00    Types: Cigarettes    Quit date: 2000    Years since quitting: 23.4   Smokeless tobacco: Never  Vaping Use   Vaping Use: Never used  Substance and Sexual Activity   Alcohol use: Never   Drug use: Never   Sexual activity: Never  Other Topics Concern   Not on file  Social History Narrative   ** Merged History Encounter **       Social Determinants of Health   Financial Resource Strain: Medium Risk (09/26/2018)   Overall Financial Resource Strain (CARDIA)    Difficulty of Paying Living Expenses: Somewhat hard   Food Insecurity: No Food Insecurity (06/03/2021)   Hunger Vital Sign    Worried About Running Out of Food in the Last Year: Never true    Ran Out of Food in the Last Year: Never true  Transportation Needs: No Transportation Needs (04/26/2021)   PRAPARE - Hydrologist (Medical): No    Lack of Transportation (Non-Medical): No  Physical Activity: Inactive (12/15/2019)   Exercise Vital Sign    Days of Exercise per Week: 0 days    Minutes of Exercise per Session: 0 min  Stress: Stress Concern Present (09/26/2018)   [place]    Feeling of Stress : To some extent  Social Connections: Not on file  Intimate Partner Violence: Not on file   Family History  Problem Relation Age of Onset   Hypertension Mother    Other Father        sudden cardiac arrest   BP 112/70   Pulse 71   Wt 105.6 kg (232 lb 12.8 oz)   SpO2 93%   BMI 35.40 kg/m   Wt Readings from Last 3 Encounters:  08/16/21 105.6 kg (232 lb 12.8 oz)  08/09/21 103.4 kg (228 lb)  08/02/21 102.5 kg (226 lb)   PHYSICAL EXAM: General:  NAD. No resp difficulty, walked into clinic. HEENT: Normal Neck: Supple. No JVD. Carotids 2+ bilat; no bruits. No lymphadenopathy or thryomegaly appreciated. Cor: PMI nondisplaced. Regular rate & rhythm. No rubs, gallops or murmurs. Lungs: Clear Abdomen: Obese, soft, nontender, nondistended. No hepatosplenomegaly. No bruits or masses. Good bowel sounds. Extremities: No cyanosis, clubbing, rash, edema Neuro: Alert & oriented x 3, cranial nerves grossly intact. Moves all 4 extremities w/o difficulty. Affect pleasant.  ECG: atrial fibrillation rBBB, 75 bpm (personally reviewed)  ASSESSMENT & PLAN: 1. Chronic Systolic Heart Failure - NICM. Dates back to at least 2018, EFs have fluctuated from 20-40% -  most recent echo 2/22 EF 20-25%, RV moderately reduced - LHC in 2021 showed normal coronaries - suspect  tachy mediated CM from long history of chronic, poorly rate controlled Afib/flutter  - Planning CRT-D at some point w/ Dr. Curt Bears. QRS 158 ms  - He does not tolerate rapid Afib well, in setting of severe LV dysfunction  - NYHA III. Volume status ok today. - Start losartan 12.5 mg daily. [name] cost-prohibitive) - Continue torsemide 60 mg daily. - Restart digoxin 0.125 mg daily. - Continue Jardiance 10 mg daily. - Continue metoprolol tartrate 100 mg bid (switch to Toprol XL soon) - Continue spiro 12.5 mg daily.   - Labs today. Check dig trough and BMET in 10 days.   2. Permanent Atrial Fibrillation  - failed multiple cardioversions and AAD therapy . - issues w/ Rapid Afib in the past, complicating HF - Rate control w/ metoprolol and digoxin.  - Followed by Dr. Curt Bears w/ plans to implant CRT-D +/- AV nodal ablation, but procedure canceled due bed bugs back in march. Has EP follow up in a couple weeks, favor AVN ablation/device sooner rather than later.  - On chronic Coumadin, INRs managed by PCP. Check INR today and forward to PCP.  3. PE, bilateral small sub-segmental - new on CTA 5/23 2/2 sub-therapeutic INR - Lovenox not financially feasible. - No DOAC due to interaction with phenobarb. - On Coumadin.   4. Pulmonary Hypertension  - Suspect combination WHO Group 2 and 3 - PFTs in 2018 showed moderate OAD. Also w/ OSA    5. OSA  - Encouraged CPAP compliance . - Stressed importance of CPAP    6. HTN - Well-controlled. - Add losartan as above.  7. Medication non-compliance - Continue paramedicine, appreciate their assistance. - Patient's wife has declined HD, anticipate her passing in next couple of weeks.  - Consider engaging HFSW support services in community.    Follow up 2 months with Dr. Haroldine Laws, as scheduled.   Allena Katz, FNP-BC 08/16/21

## 2021-08-23 ENCOUNTER — Other Ambulatory Visit (HOSPITAL_COMMUNITY): Payer: Self-pay

## 2021-08-23 NOTE — Progress Notes (Signed)
Paramedicine Encounter    Patient ID: Ruben Murray, male    DOB: [birth year], 76 y.o.   MRN: [id number]   Arrived for home visit for Ruben Murray who reports feeling good with no complaints today. He denied chest pain, dizziness, increased shortness of breath or swelling. Weight is down this week to 228 from 232. He has no lower leg edema noted. Lung sounds clear. Vitals are as follows:  BP- 110/80 HR- 86 RR- 16 O2- 97% WT- 228lbs   I reviewed medications and confirmed same filling pill box for one week. No refills needed at this time.   We reviewed upcoming appointments and confirmed same. I wrote down reminders on all calendars in the home.   I plan to come out next week for home visit. [name] agreed. Home visit complete.   [name], [place] 08/23/2021   Patient Care Team: [name], MD as PCP - General (Family Medicine) [name], MD as PCP - Cardiology (Cardiology) [name], MD as PCP - Electrophysiology (Cardiology) [name], MD (Family Medicine) [name] as Case Manager (Hospice and Palliative Medicine)  Patient Active Problem List   Diagnosis Date Noted   AKI (acute kidney injury) ([place]) 07/27/2021   Acute pulmonary embolism ([place]) 07/26/2021   Acute on chronic systolic CHF (congestive heart failure) ([place]) 05/17/2021   Hypercoagulable state ([place]) 05/10/2021   Left leg pain 04/24/2021   OSA on CPAP 04/24/2021   Mitral regurgitation 04/24/2021   Tricuspid regurgitation 04/24/2021   Dilated aortic root ([place]) 04/24/2021   Permanent atrial fibrillation ([place]) 04/19/2021   Chronic arthropathy 04/13/2020   Callus 04/13/2020   Onychomycosis 12/12/2019   Left leg cellulitis 53/97/6734   Chronic systolic heart failure ([place]) 12/09/2019   Dilated cardiomyopathy ([place])    Atrial flutter ([place]) 11/03/2019   Atrial flutter with rapid ventricular response ([place]) 11/03/2019   Acquired thrombophilia ([place]) 01/15/2019    Atrial fibrillation with rapid ventricular response ([place]) 10/29/2017   Chronic combined systolic and diastolic heart failure ([place]) 10/29/2017   Prolonged QT interval 10/29/2017   Sensorineural hearing loss (SNHL) of both ears 01/31/2017   Dysphonia 10/03/2016   Laryngopharyngeal reflux (LPR) 10/03/2016   Nasal polyps 10/03/2016   Rhinitis medicamentosa 10/03/2016   Chronic laryngitis 10/03/2016   Hoarseness 08/31/2016   Moderate persistent asthma 08/02/2016   COPD (chronic obstructive pulmonary disease) ([place]) 08/02/2016   Morbid (severe) obesity due to excess calories ([place]) 08/02/2016   Community acquired pneumonia 03/06/2016   Essential hypertension 03/06/2016   Mixed hyperlipidemia 03/06/2016   Right thyroid nodule 03/06/2016   Seizures ([place]) 03/06/2016   Fever 03/12/2015   Recurrent erosion of cornea 06/23/2011    Current Outpatient Medications:    acetaminophen (TYLENOL) 500 MG tablet, Take 1,000 mg by mouth at bedtime as needed for mild pain or headache., Disp: , Rfl:    Carboxymethylcellulose Sod PF 0.5 % SOLN, Place 1 drop into both eyes 4 (four) times daily as needed (dry eyes)., Disp: , Rfl:    Cholecalciferol (VITAMIN D3) 25 MCG (1000 UT) CAPS, Take 1,000 Units by mouth daily., Disp: , Rfl:    digoxin (LANOXIN) 0.125 MG tablet, Take 1 tablet (0.125 mg total) by mouth daily., Disp: 30 tablet, Rfl: 3   empagliflozin (JARDIANCE) 10 MG TABS tablet, Take 1 tablet (10 mg total) by mouth daily before breakfast., Disp: 90 tablet, Rfl: 3   escitalopram (LEXAPRO) 20 MG tablet, Take 20 mg by mouth daily. Takes at bedtime., Disp: ,  Rfl:    finasteride (PROSCAR) 5 MG tablet, Take 5 mg by mouth daily at 12 noon. , Disp: , Rfl:    fluticasone (FLONASE) 50 MCG/ACT nasal spray, Place 1 spray into both nostrils 2 (two) times daily., Disp: , Rfl:    loratadine (CLARITIN) 10 MG tablet, Take 10 mg by mouth daily., Disp: , Rfl:    losartan (COZAAR) 25 MG tablet, Take 0.5 tablets (12.5 mg  total) by mouth daily., Disp: 45 tablet, Rfl: 3   metoprolol tartrate (LOPRESSOR) 100 MG tablet, Take 1 tablet (100 mg total) by mouth 2 (two) times daily., Disp: 180 tablet, Rfl: 2   Multiple Vitamin (MULTIVITAMIN WITH MINERALS) TABS tablet, Take 1 tablet by mouth daily at 12 noon., Disp: , Rfl:    PHENobarbital (LUMINAL) 97.2 MG tablet, Take 97.2 mg by mouth daily. Takes between 3:00-5:00pm., Disp: , Rfl:    potassium chloride SA (KLOR-CON M) 20 MEQ tablet, Take 1 tablet (20 mEq total) by mouth daily., Disp: 90 tablet, Rfl: 3   pravastatin (PRAVACHOL) 40 MG tablet, Take 40 mg by mouth at bedtime. , Disp: , Rfl:    spironolactone (ALDACTONE) 25 MG tablet, Take 0.5 tablets (12.5 mg total) by mouth daily., Disp: 45 tablet, Rfl: 2   torsemide 60 MG TABS, Take 60 mg by mouth daily., Disp: 30 tablet, Rfl: 1   triamcinolone cream (KENALOG) 0.1 %, Apply 1 application. topically 2 (two) times daily as needed (rash)., Disp: , Rfl:    warfarin (COUMADIN) 5 MG tablet, Take 2.5-5 mg by mouth See admin instructions. Taking 2.5 mg ( 1/2 tablet) on Monday only. All other days of the week taking 5 mg., Disp: , Rfl:  Allergies  Allergen Reactions   Dilaudid [Hydromorphone Hcl] Other (See Comments)    Makes pt hyper    Hydromorphone Other (See Comments)    hyperactiviity Other reaction(s): made him wild Other reaction(s): Unknown   Morphine Sulfate     Other reaction(s): at high dose causes confusion   Tegretol [Carbamazepine] Hives   Carbamazepine Hives and Rash    Other reaction(s): hives Other reaction(s): Hives     Social History   Socioeconomic History   Marital status: Married    Spouse name: Not on file   Number of children: Not on file   Years of education: Not on file   Highest education level: Not on file  Occupational History   Not on file  Tobacco Use   Smoking status: Former    Packs/day: 3.00    Years: 48.00    Total pack years: 144.00    Types: Cigarettes    Quit date: 2000     Years since quitting: 23.4   Smokeless tobacco: Never  Vaping Use   Vaping Use: Never used  Substance and Sexual Activity   Alcohol use: Never   Drug use: Never   Sexual activity: Never  Other Topics Concern   Not on file  Social History Narrative   ** Merged History Encounter **       Social Determinants of Health   Financial Resource Strain: Medium Risk (09/26/2018)   Overall Financial Resource Strain (CARDIA)    Difficulty of Paying Living Expenses: Somewhat hard  Food Insecurity: No Food Insecurity (06/03/2021)   Hunger Vital Sign    Worried About Running Out of Food in the Last Year: Never true    Ran Out of Food in the Last Year: Never true  Transportation Needs: No Transportation Needs (04/26/2021)  PRAPARE - Hydrologist (Medical): No    Lack of Transportation (Non-Medical): No  Physical Activity: Inactive (12/15/2019)   Exercise Vital Sign    Days of Exercise per Week: 0 days    Minutes of Exercise per Session: 0 min  Stress: Stress Concern Present (09/26/2018)   [place]    Feeling of Stress : To some extent  Social Connections: Not on file  Intimate Partner Violence: Not on file    Physical Exam      Future Appointments  Date Time Provider [place]  08/26/2021 11:30 AM [facility] None  08/31/2021  2:20 PM Baldwin [place], PA-C [facility]  09/27/2021 11:20 AM Baldwin [place], PA-C [facility]  10/24/2021 11:00 AM Bensimhon, Shaune Pascal, MD [facility] None  11/14/2021  3:00 PM [name], MD [facility]     ACTION: Home visit completed

## 2021-08-24 DIAGNOSIS — I5042 Chronic combined systolic (congestive) and diastolic (congestive) heart failure: Secondary | ICD-10-CM | POA: Diagnosis not present

## 2021-08-24 DIAGNOSIS — D631 Anemia in chronic kidney disease: Secondary | ICD-10-CM | POA: Diagnosis not present

## 2021-08-24 DIAGNOSIS — J449 Chronic obstructive pulmonary disease, unspecified: Secondary | ICD-10-CM | POA: Diagnosis not present

## 2021-08-24 DIAGNOSIS — G4733 Obstructive sleep apnea (adult) (pediatric): Secondary | ICD-10-CM | POA: Diagnosis not present

## 2021-08-24 DIAGNOSIS — I480 Paroxysmal atrial fibrillation: Secondary | ICD-10-CM | POA: Diagnosis not present

## 2021-08-24 DIAGNOSIS — G40909 Epilepsy, unspecified, not intractable, without status epilepticus: Secondary | ICD-10-CM | POA: Diagnosis not present

## 2021-08-24 DIAGNOSIS — E1122 Type 2 diabetes mellitus with diabetic chronic kidney disease: Secondary | ICD-10-CM | POA: Diagnosis not present

## 2021-08-24 DIAGNOSIS — I130 Hypertensive heart and chronic kidney disease with heart failure and stage 1 through stage 4 chronic kidney disease, or unspecified chronic kidney disease: Secondary | ICD-10-CM | POA: Diagnosis not present

## 2021-08-24 DIAGNOSIS — N1832 Chronic kidney disease, stage 3b: Secondary | ICD-10-CM | POA: Diagnosis not present

## 2021-08-26 ENCOUNTER — Inpatient Hospital Stay (HOSPITAL_COMMUNITY): Admission: RE | Admit: 2021-08-26 | Payer: Medicare PPO | Source: Ambulatory Visit

## 2021-08-28 NOTE — Progress Notes (Signed)
Cardiology Office Note Date:  08/28/2021  Patient ID:  Ruben, Murray 1945/06/29, MRN [id number] PCP:  Ruben Macadam, MD  Cardiologist:  Dr. Marlou Murray Electrophysiologist: Dr. Curt Murray    Chief Complaint: post hospital  History of Present Illness: Ruben Murray is a 76 y.o. male with history of obesity, HTN, OSA, seizure d/o, permanent AFib, DCM (?tachy mediated), chronic CHF (systolic), PE, p.HTN  He comes in today to be seen for Dr. Curt Murray, seen 06/20/21, he had been in the hospital March and metoprolol, dig  added for better AF control. He had moved to a new appt recently, though bed bugs from his olkd placed followed along and was pending an extermination. Volume status remained OK He was rate controlled at the visit and they discussed prior recommendations PPM (or ICD) and AV node ablation (CANCELLED previously 2/2 bedbugs) though with rate controlled currently planned to hold off.    Hospitalized 07/26/21 when paramedicine noted his HR 120's, in the ER 150's, and found with small subsegmental PE b/l, RV with signs of strain,  started on IV heparin Dr. Marlou Murray felt RV strain more likely 2/2 elevated RA pressures in setting of LV dysfunction, not small PEs, continued coumadin (unable to use OACs 2/2 his chronic phenobarbital) Expected his rates would improve with PE and CHF management DIG STOPPED with elevated dig level on 0.38m ??  Restarted 0.1226mThere was discussion of changing to lovenox by hematology but felt to be financially unable to do and warfarin continued Discharged 08/01/21  He saw the HF team 08/16/21, doing better, had run out of his dig a few days, otherwise good medicine compliance, SOB with longer distances only Entresto cost prohibative and started on losartan, dig resumed Planned f/u with EP to revisit AV node ablation/device Getting para medicine, the pt apparently caregiver forhis wife with advancing dementia.  Last Echos 07/27/21: LVEF 20% 05/18/21:  20-25% 8.31.21: 20-25%  TODAY He is accompanied by his neighbor and friend today He is doing pretty well, denies any CP, palpitations or cardiac awareness He has no rest SOB but gets winded pretty easily, walking in the house gets him SOB, this he reports his baseline for quite a long time now. No near syncope or syncope. No bleeding or signs of bleeding    Device information Diagnosed jan 2020 in the environment of PNA Initial DCCV failed and started on amiodarone 2020 Off amiodarone > permament AF  Past Medical History:  Diagnosis Date   Acquired thrombophilia ([facility]11/01/2019   Aortic atherosclerosis (HCC)    Aortic root dilatation (HCC)    CAP (community acquired pneumonia) 03/06/2016   Chronic laryngitis 0797/53/0051 Chronic systolic CHF (congestive heart failure) ([facility]   COPD GOLD II  08/02/2016   Quit smoking 2000  PFT's  07/10/2016  FEV1 1.98 (70 % ) ratio 67  p 19 % improvement from saba p nothing  prior to study while of coreg so rec as of 08/02/2016 try off coreg and on bisoprolol      Essential hypertension 03/06/2016   Changed from coreg to bisoprolol due to copd with reversible component  08/02/2016 >>>    Hoarseness 08/31/2016   Referred to ent 08/31/2016 >>> seen 10/03/16 [facility] Bas[facility]x gerd and rhinitis medicamentosa   > improved on f/u 01/31/17 on gerd rx/ flonase and off afrin   Hyperlipidemia    Hypertension    Laryngopharyngeal reflux (LPR) 10/03/2016   Mitral regurgitation    Moderate persistent asthma 08/02/2016  Morbid (severe) obesity due to excess calories ([place]) 08/02/2016   Nasal polyps 10/03/2016   NICM (nonischemic cardiomyopathy) ([place])    a. EF 40-45% in 04/2016, etiology not defined, managed medically.   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   Rhinitis medicamentosa 10/03/2016   Right thyroid nodule 03/06/2016   Seizures ([place])    "take RX daily"  (03/06/2016)   Sensorineural hearing loss (SNHL) of both ears 01/31/2017   Tricuspid regurgitation     Past Surgical History:  Procedure Laterality Date   CARDIOVERSION N/A 12/13/2017   Procedure: CARDIOVERSION;  Surgeon: [name], MD;  Location: [place];  Service: Cardiovascular;  Laterality: N/A;   CARDIOVERSION N/A 09/25/2018   Procedure: CARDIOVERSION;  Surgeon: [name], MD;  Location: [facility] ENDOSCOPY;  Service: Cardiovascular;  Laterality: N/A;   CARDIOVERSION N/A 11/04/2018   Procedure: CARDIOVERSION;  Surgeon: [name], MD;  Location: [facility] ENDOSCOPY;  Service: Cardiovascular;  Laterality: N/A;   CARDIOVERSION N/A 04/11/2019   Procedure: CARDIOVERSION;  Surgeon: [name], MD;  Location: [facility] ENDOSCOPY;  Service: Cardiovascular;  Laterality: N/A;   CIRCUMCISION     JOINT REPLACEMENT     PERCUTANEOUS PINNING TOE FRACTURE Left    "big toe   REPLACEMENT TOTAL KNEE Left    RIGHT/LEFT HEART CATH AND CORONARY ANGIOGRAPHY N/A 11/06/2019   Procedure: RIGHT/LEFT HEART CATH AND CORONARY ANGIOGRAPHY;  Surgeon: [name], MD;  Location: [facility];  Service: Cardiovascular;  Laterality: N/A;   TONSILLECTOMY AND ADENOIDECTOMY      Current Outpatient Medications  Medication Sig Dispense Refill   acetaminophen (TYLENOL) 500 MG tablet Take 1,000 mg by mouth at bedtime as needed for mild pain or headache.     Carboxymethylcellulose Sod PF 0.5 % SOLN Place 1 drop into both eyes 4 (four) times daily as needed (dry eyes).     Cholecalciferol (VITAMIN D3) 25 MCG (1000 UT) CAPS Take 1,000 Units by mouth daily.     digoxin (LANOXIN) 0.125 MG tablet Take 1 tablet (0.125 mg total) by mouth daily. 30 tablet 3   empagliflozin (JARDIANCE) 10 MG TABS tablet Take 1 tablet (10 mg total) by mouth daily before breakfast. 90 tablet 3   escitalopram (LEXAPRO) 20 MG tablet Take 20 mg by mouth daily. Takes at bedtime.     finasteride (PROSCAR) 5 MG tablet Take 5 mg by mouth  daily at 12 noon.      fluticasone (FLONASE) 50 MCG/ACT nasal spray Place 1 spray into both nostrils 2 (two) times daily.     loratadine (CLARITIN) 10 MG tablet Take 10 mg by mouth daily.     losartan (COZAAR) 25 MG tablet Take 0.5 tablets (12.5 mg total) by mouth daily. 45 tablet 3   metoprolol tartrate (LOPRESSOR) 100 MG tablet Take 1 tablet (100 mg total) by mouth 2 (two) times daily. 180 tablet 2   Multiple Vitamin (MULTIVITAMIN WITH MINERALS) TABS tablet Take 1 tablet by mouth daily at 12 noon.     PHENobarbital (LUMINAL) 97.2 MG tablet Take 97.2 mg by mouth daily. Takes between 3:00-5:00pm.     potassium chloride SA (KLOR-CON M) 20 MEQ tablet Take 1 tablet (20 mEq total) by mouth daily. 90 tablet 3   pravastatin (PRAVACHOL) 40 MG tablet Take 40 mg by mouth at bedtime.      spironolactone (ALDACTONE) 25 MG tablet  Take 0.5 tablets (12.5 mg total) by mouth daily. 45 tablet 2   torsemide 60 MG TABS Take 60 mg by mouth daily. 30 tablet 1   triamcinolone cream (KENALOG) 0.1 % Apply 1 application. topically 2 (two) times daily as needed (rash).     warfarin (COUMADIN) 5 MG tablet Take 2.5-5 mg by mouth See admin instructions. Taking 75m every night at bedtime. (Using one 540mtablet and one 23m58mablet)     No current facility-administered medications for this visit.    Allergies:   Dilaudid [hydromorphone hcl], Hydromorphone, Morphine sulfate, Tegretol [carbamazepine], and Carbamazepine   Social History:  The patient  reports that he quit smoking about 23 years ago. His smoking use included cigarettes. He has a 144.00 pack-year smoking history. He has never used smokeless tobacco. He reports that he does not drink alcohol and does not use drugs.   Family History:  The patient's family history includes Hypertension in his mother; Other in his father.  ROS:  Please see the history of present illness.    All other systems are reviewed and otherwise negative.   PHYSICAL EXAM:  VS:  There were no  vitals taken for this visit. BMI: There is no height or weight on file to calculate BMI. Well nourished, well developed, in no acute distress HEENT: normocephalic, atraumatic Neck: no JVD, carotid bruits or masses Cardiac:  irreg-irreg; no significant murmurs, no rubs, or gallops Lungs:  CTA b/l, no wheezing, rhonchi or rales Abd: soft, nontender MS: no deformity or atrophy Ext: no edema Skin: warm and dry, no rash Neuro:  No gross deficits appreciated Psych: euthymic mood, full affect   EKG:  done today and reviewed by myself: AFib 72bpm, RBBB, QRS 156bpm  07/27/21: TTE  1. Left ventricular ejection fraction, by estimation, is 20%. The left  ventricle has severely decreased function. The left ventricle demonstrates  global hypokinesis. The left ventricular internal cavity size was mildly  dilated. Left ventricular  diastolic parameters are indeterminate.   2. Right ventricular systolic function is moderately reduced. The right  ventricular size is severely enlarged. There is normal pulmonary artery  systolic pressure. The estimated right ventricular systolic pressure is  34.70.6Hg.   3. Left atrial size was mildly dilated.   4. Right atrial size was moderately dilated.   5. The mitral valve is normal in structure. Mild mitral valve  regurgitation. No evidence of mitral stenosis.   6. Tricuspid valve regurgitation is moderate to severe.   7. The aortic valve is tricuspid. There is mild calcification of the  aortic valve. Aortic valve regurgitation is mild. Aortic valve sclerosis  is present, with no evidence of aortic valve stenosis.   8. Aortic dilatation noted. There is mild dilatation of the aortic root,  measuring 37 mm.   9. The inferior vena cava is dilated in size with <50% respiratory  variability, suggesting right atrial pressure of 15 mmHg.  10. The patient was in atrial fibrillation.   11/06/19: LHC Findings are consistent with a dilated nonischemic  cardiomyopathy. Normal coronary arteries with a codominant system. Mild right heart pressure elevation with mild pulmonary hypertension; PA 34/22, mean PA pressure 28 mm. Atrial flutter with variable block and rapid ventricular response.   RECOMMENDATION:  Guideline directed medical therapy for nonischemic cardiomyopathy.  In this patient with recent Covid 19 positivity consider post Covid myocarditis.   Recent Labs: 04/19/2021: TSH 1.898 07/26/2021: B Natriuretic Peptide 753.5 07/29/2021: ALT 16 08/01/2021: Magnesium 2.4 08/16/2021: BUN  33; Creatinine, Ser 1.41; Hemoglobin 13.0; Platelets 225; Potassium 4.2; Sodium 142  07/30/2021: Cholesterol 110; HDL 38; LDL Cholesterol 59; Total CHOL/HDL Ratio 2.9; Triglycerides 67; VLDL 13   Estimated Creatinine Clearance: 50.3 mL/min (A) (by C-G formula based on SCr of 1.41 mg/dL (H)).   Wt Readings from Last 3 Encounters:  08/23/21 228 lb (103.4 kg)  08/16/21 232 lb 12.8 oz (105.6 kg)  08/09/21 228 lb (103.4 kg)     Other studies reviewed: Additional studies/records reviewed today include: summarized above  ASSESSMENT AND PLAN:  Permanent AFib Intermittently difficult to control with thoughts towards device implant and AV node ablation On warfarin Rate is controlled today He took dig this AM and will plan a dig level with pre-ops  DCM Chronic CHF He does not look volume OL today In prior d/w Dr. Curt Murray, we can plan for ICD implant given his DCM for primary prevention and plan for a CRT device given he may require AV node ablation in the future though would not ablate at time of implant  Discussed with the patient. He would like to proceed with the implant, he recalls his prior conversations with Dr. Curt Murray, regarding the implant procedure, his f/u questions were answered today  Will have Dr. Macky Lower RN reach out to them to find a date that works for him/his neighbor's who will be driving and helping him post procedure to scheduled a  date. Plan pre-op labs including a dig level and INR. He has been instructed not to take the dig AM of his lab draw.   Disposition: F/u with EP post procedure as usual  Current medicines are reviewed at length with the patient today.  The patient did not have any concerns regarding medicines.  Venetia Night, PA-C 08/28/2021 3:26 PM     [address] [place] [phone] (office)  [phone] (fax)

## 2021-08-30 ENCOUNTER — Other Ambulatory Visit (HOSPITAL_COMMUNITY): Payer: Self-pay | Admitting: Emergency Medicine

## 2021-08-31 ENCOUNTER — Ambulatory Visit (INDEPENDENT_AMBULATORY_CARE_PROVIDER_SITE_OTHER): Payer: Medicare PPO | Admitting: Physician Assistant

## 2021-08-31 ENCOUNTER — Encounter: Payer: Self-pay | Admitting: Physician Assistant

## 2021-08-31 VITALS — BP 96/50 | HR 86 | Ht 68.0 in | Wt 230.4 lb

## 2021-08-31 DIAGNOSIS — I420 Dilated cardiomyopathy: Secondary | ICD-10-CM

## 2021-08-31 DIAGNOSIS — I4821 Permanent atrial fibrillation: Secondary | ICD-10-CM

## 2021-08-31 DIAGNOSIS — I5022 Chronic systolic (congestive) heart failure: Secondary | ICD-10-CM

## 2021-08-31 NOTE — Patient Instructions (Signed)
Medication Instructions:  Your physician recommends that you continue on your current medications as directed. Please refer to the Current Medication list given to you today.  *If you need a refill on your cardiac medications before your next appointment, please call your pharmacy*   Lab Work:  None today, but Venida Jarvis will call you and let you know when to come for it.  WHEN YOU DO, DO NOT TAKE YOUR DIGOXIN LEVEL ON THAT MORNING.  If you have labs (blood work) drawn today and your tests are completely normal, you will receive your results only by: [place] (if you have MyChart) OR A paper copy in the mail If you have any lab test that is abnormal or we need to change your treatment, we will call you to review the results.   Testing/Procedures: None ordered   Follow-Up: At [facility], you and your health needs are our priority.  As part of our continuing mission to provide you with exceptional heart care, we have created designated Provider Care Teams.  These Care Teams include your primary Cardiologist (physician) and Advanced Practice Providers (APPs -  Physician Assistants and Nurse Practitioners) who all work together to provide you with the care you need, when you need it.  We recommend signing up for the patient portal called "MyChart".  Sign up information is provided on this After Visit Summary.  MyChart is used to connect with patients for Virtual Visits (Telemedicine).  Patients are able to view lab/test results, encounter notes, upcoming appointments, etc.  Non-urgent messages can be sent to your provider as well.   To learn more about what you can do with MyChart, go to [email or web address].    Your next appointment:   WE WILL CALL YOU   The format for your next appointment:     Provider:       Other Instructions Sherri, RN, will call you to arrange  your procedure.  Important Information About [place]

## 2021-09-13 ENCOUNTER — Other Ambulatory Visit (HOSPITAL_COMMUNITY): Payer: Self-pay

## 2021-09-13 NOTE — Progress Notes (Signed)
Paramedicine Encounter    Patient ID: Ruben Murray, male    DOB: 11-06-1945, 76 y.o.   MRN: [id number]  Arrived for home visit for Ruben Murray who was seated in his recliner alert and oriented reporting to be feeling good with no complaints. He denied shortness of breath, chest pain or dizziness over the last two weeks. He reports that he has been compliant with his medications and has been limiting his sodium intake. He denied having any increased shortness of breath or swelling. No lower leg swelling noted. Lung sounds clear. I obtained vitals-  WT- 224lbs BP- 122/70 HR- 50 RR- 16 O2- 96%  I reviewed meds and confirmed same filling pill box for one week. No digoxin in pill box as he needs refilled. [facility]- will be [facility] tomorrow. His care taker [name] is aware and plans to pick it up and place it in his pill box.    Refills: Potassium Digoxin    WARFARIN UPDATED PER PCP 8MG DAILY (TWO 4MG TABLETS)    I reviewed upcoming appointments and confirmed same writing them down on his personal calendar and the ones posted on the walls in his home.   He denied any social concerns at present. He states him and his wife now have in home PT/OT and Nurse with [facility]  MSW is Ovidio Kin [phone]    I plan to come out to see Ruben Murray in one week. Home visit complete.   [name], [place] 09/13/2021    Patient Care Team: [name], MD as PCP - General (Family Medicine) [name], MD as PCP - Cardiology (Cardiology) [name], MD as PCP - Electrophysiology (Cardiology) [name], MD (Family Medicine) [place] [place] [facility] as Case Manager (Hospice and Palliative Medicine)  Patient Active Problem List   Diagnosis Date Noted   AKI (acute kidney injury) ([place]) 07/27/2021   Acute pulmonary embolism ([place]) 07/26/2021   Acute on chronic systolic CHF (congestive heart failure) ([place]) 05/17/2021   Hypercoagulable state ([place])  05/10/2021   Left leg pain 04/24/2021   OSA on CPAP 04/24/2021   Mitral regurgitation 04/24/2021   Tricuspid regurgitation 04/24/2021   Dilated aortic root ([place]) 04/24/2021   Permanent atrial fibrillation ([place]) 04/19/2021   Chronic arthropathy 04/13/2020   Callus 04/13/2020   Onychomycosis 12/12/2019   Left leg cellulitis 67/02/4579   Chronic systolic heart failure ([place]) 12/09/2019   Dilated cardiomyopathy ([place])    Atrial flutter ([place]) 11/03/2019   Atrial flutter with rapid ventricular response ([place]) 11/03/2019   Acquired thrombophilia ([place]) 01/15/2019   Atrial fibrillation with rapid ventricular response ([place]) 10/29/2017   Chronic combined systolic and diastolic heart failure ([place]) 10/29/2017   Prolonged QT interval 10/29/2017   Sensorineural hearing loss (SNHL) of both ears 01/31/2017   Dysphonia 10/03/2016   Laryngopharyngeal reflux (LPR) 10/03/2016   Nasal polyps 10/03/2016   Rhinitis medicamentosa 10/03/2016   Chronic laryngitis 10/03/2016   Hoarseness 08/31/2016   Moderate persistent asthma 08/02/2016   COPD (chronic obstructive pulmonary disease) ([place]) 08/02/2016   Morbid (severe) obesity due to excess calories ([place]) 08/02/2016   Community acquired pneumonia 03/06/2016   Essential hypertension 03/06/2016   Mixed hyperlipidemia 03/06/2016   Right thyroid nodule 03/06/2016   Seizures ([place]) 03/06/2016   Fever 03/12/2015   Recurrent erosion of cornea 06/23/2011    Current Outpatient Medications:    acetaminophen (TYLENOL) 500 MG tablet, Take 1,000 mg by mouth at bedtime as needed for mild pain  or headache., Disp: , Rfl:    Carboxymethylcellulose Sod PF 0.5 % SOLN, Place 1 drop into both eyes 4 (four) times daily as needed (dry eyes)., Disp: , Rfl:    Cholecalciferol (VITAMIN D3) 25 MCG (1000 UT) CAPS, Take 1,000 Units by mouth daily., Disp: , Rfl:    digoxin (LANOXIN) 0.125 MG tablet, Take 1 tablet (0.125 mg total) by mouth daily., Disp: 30 tablet, Rfl: 3   empagliflozin  (JARDIANCE) 10 MG TABS tablet, Take 1 tablet (10 mg total) by mouth daily before breakfast., Disp: 90 tablet, Rfl: 3   escitalopram (LEXAPRO) 20 MG tablet, Take 20 mg by mouth daily. Takes at bedtime., Disp: , Rfl:    finasteride (PROSCAR) 5 MG tablet, Take 5 mg by mouth daily at 12 noon. , Disp: , Rfl:    fluticasone (FLONASE) 50 MCG/ACT nasal spray, Place 1 spray into both nostrils 2 (two) times daily., Disp: , Rfl:    loratadine (CLARITIN) 10 MG tablet, Take 10 mg by mouth daily., Disp: , Rfl:    losartan (COZAAR) 25 MG tablet, Take 0.5 tablets (12.5 mg total) by mouth daily., Disp: 45 tablet, Rfl: 3   metoprolol tartrate (LOPRESSOR) 100 MG tablet, Take 1 tablet (100 mg total) by mouth 2 (two) times daily., Disp: 180 tablet, Rfl: 2   Multiple Vitamin (MULTIVITAMIN WITH MINERALS) TABS tablet, Take 1 tablet by mouth daily at 12 noon., Disp: , Rfl:    PHENobarbital (LUMINAL) 97.2 MG tablet, Take 97.2 mg by mouth daily. Takes between 3:00-5:00pm., Disp: , Rfl:    potassium chloride SA (KLOR-CON M) 20 MEQ tablet, Take 1 tablet (20 mEq total) by mouth daily., Disp: 90 tablet, Rfl: 3   pravastatin (PRAVACHOL) 40 MG tablet, Take 40 mg by mouth at bedtime. , Disp: , Rfl:    spironolactone (ALDACTONE) 25 MG tablet, Take 0.5 tablets (12.5 mg total) by mouth daily., Disp: 45 tablet, Rfl: 2   torsemide 60 MG TABS, Take 60 mg by mouth daily., Disp: 30 tablet, Rfl: 1   triamcinolone cream (KENALOG) 0.1 %, Apply 1 application  topically 2 (two) times daily as needed (rash)., Disp: , Rfl:    warfarin (COUMADIN) 4 MG tablet, Take 4 mg by mouth 2 (two) times daily., Disp: , Rfl:  Allergies  Allergen Reactions   Dilaudid [Hydromorphone Hcl] Other (See Comments)    Makes pt hyper    Hydromorphone Other (See Comments)    hyperactiviity Other reaction(s): made him wild Other reaction(s): Unknown   Morphine Sulfate     Other reaction(s): at high dose causes confusion Other reaction(s): at high dose causes  confusion   Tegretol [Carbamazepine] Hives   Carbamazepine Hives and Rash    Other reaction(s): hives Other reaction(s): Hives Other reaction(s): hives     Social History   Socioeconomic History   Marital status: Married    Spouse name: Not on file   Number of children: Not on file   Years of education: Not on file   Highest education level: Not on file  Occupational History   Not on file  Tobacco Use   Smoking status: Former    Packs/day: 3.00    Years: 48.00    Total pack years: 144.00    Types: Cigarettes    Quit date: 2000    Years since quitting: 23.5   Smokeless tobacco: Never  Vaping Use   Vaping Use: Never used  Substance and Sexual Activity   Alcohol use: Never   Drug use: Never  Sexual activity: Never  Other Topics Concern   Not on file  Social History Narrative   ** Merged History Encounter **       Social Determinants of Health   Financial Resource Strain: Medium Risk (09/26/2018)   Overall Financial Resource Strain (CARDIA)    Difficulty of Paying Living Expenses: Somewhat hard  Food Insecurity: No Food Insecurity (06/03/2021)   Hunger Vital Sign    Worried About Running Out of Food in the Last Year: Never true    Ran Out of Food in the Last Year: Never true  Transportation Needs: No Transportation Needs (04/26/2021)   PRAPARE - Hydrologist (Medical): No    Lack of Transportation (Non-Medical): No  Physical Activity: Inactive (12/15/2019)   Exercise Vital Sign    Days of Exercise per Week: 0 days    Minutes of Exercise per Session: 0 min  Stress: Stress Concern Present (09/26/2018)   [place]    Feeling of Stress : To some extent  Social Connections: Not on file  Intimate Partner Violence: Not on file    Physical Exam      Future Appointments  Date Time Provider [place]  09/27/2021 11:20 AM Baldwin [place], PA-C [facility]  10/24/2021 11:00 AM Bensimhon, Shaune Pascal, MD [facility] None  11/14/2021  3:00 PM [name], MD [facility]     ACTION: Home visit completed

## 2021-09-19 ENCOUNTER — Telehealth: Payer: Self-pay | Admitting: *Deleted

## 2021-09-19 DIAGNOSIS — R609 Edema, unspecified: Secondary | ICD-10-CM | POA: Diagnosis not present

## 2021-09-19 NOTE — Telephone Encounter (Signed)
-----  Message from Jeanann Lewandowsky, [place] sent at 08/31/2021  2:57 PM EDT ----- Hey lady, I know you're off today... this pt is ready to schedule his Defibrillator implant :)  Renee wanted me to send you a message so you can call pt to arrange, with Dr. Macky Lower availability.  He has neighbors that help him with transportation, just [place].  Pt will need BMET, Digoxin Level (PLEASE REMIND PT NOT TO TAKE THE DIGOXIN THE MORNING OF HIS LAB), & INR.  (I HAVE PUT THE ORDERS IN, YOU JUST LET HIM KNOW WHEN TO COME :)  If I can help, let me know :)

## 2021-09-19 NOTE — Telephone Encounter (Signed)
Pt made aware that I am working on this. Aware I need to speak to hospital and will call him back this week. Patient verbalized understanding and agreeable to plan.

## 2021-09-20 ENCOUNTER — Other Ambulatory Visit (HOSPITAL_COMMUNITY): Payer: Self-pay

## 2021-09-20 NOTE — Progress Notes (Signed)
Paramedicine Encounter    Patient ID: Ruben Murray, male    DOB: 27-Apr-1945, 76 y.o.   MRN: [id number]   Arrived for home visit for [name] who was seated in his recliner on my arrival alert and oriented reporting to be feeling okay but having some severe pain and swelling in his right foot. He reports this is his gout flaring up and that he has plans to see his PCP tomorrow morning at 0845 for a visit.  He said that he called EMS yesterday and they took him to [facility] ER where he was in the waiting room for a prolonged period of time and he decided to leave AMA.   I obtained vitals and are as noted:  WT- 225lbs BP- 100/70 HR- 79 O2- 95% RR- 16   I reviewed meds and confirmed same. He has been without his digoxin for two weeks- [facility] but his caretaker was unable to pick it up. I spoke to her today and she reports her husband is on the way to pick it up for [name] now and plans to place in [name] pill box. I wrote instructions on where it goes once he brings it by.   Pill box filled for one week.  Refills called in:  [facility]- Phenobarbital Metoprolol   [facility]-  Warfarin  Potassium    I plan to follow up in one week in the home. We reviewed upcoming appointments confirmed and wrote down.   [name], [place] 09/20/2021     Patient Care Team: [name], MD as PCP - General (Family Medicine) [name], MD as PCP - Cardiology (Cardiology) [name], MD as PCP - Electrophysiology (Cardiology) [name], MD (Family Medicine) [place] [place] [facility] as Case Manager (Hospice and Palliative Medicine)  Patient Active Problem List   Diagnosis Date Noted   AKI (acute kidney injury) ([place]) 07/27/2021   Acute pulmonary embolism ([place]) 07/26/2021   Acute on chronic systolic CHF (congestive heart failure) ([place]) 05/17/2021   Hypercoagulable state ([place]) 05/10/2021   Left leg pain 04/24/2021   OSA on CPAP 04/24/2021    Mitral regurgitation 04/24/2021   Tricuspid regurgitation 04/24/2021   Dilated aortic root ([place]) 04/24/2021   Permanent atrial fibrillation ([place]) 04/19/2021   Chronic arthropathy 04/13/2020   Callus 04/13/2020   Onychomycosis 12/12/2019   Left leg cellulitis 79/89/2119   Chronic systolic heart failure ([place]) 12/09/2019   Dilated cardiomyopathy ([place])    Atrial flutter ([place]) 11/03/2019   Atrial flutter with rapid ventricular response ([place]) 11/03/2019   Acquired thrombophilia ([place]) 01/15/2019   Atrial fibrillation with rapid ventricular response ([place]) 10/29/2017   Chronic combined systolic and diastolic heart failure ([place]) 10/29/2017   Prolonged QT interval 10/29/2017   Sensorineural hearing loss (SNHL) of both ears 01/31/2017   Dysphonia 10/03/2016   Laryngopharyngeal reflux (LPR) 10/03/2016   Nasal polyps 10/03/2016   Rhinitis medicamentosa 10/03/2016   Chronic laryngitis 10/03/2016   Hoarseness 08/31/2016   Moderate persistent asthma 08/02/2016   COPD (chronic obstructive pulmonary disease) ([place]) 08/02/2016   Morbid (severe) obesity due to excess calories ([place]) 08/02/2016   Community acquired pneumonia 03/06/2016   Essential hypertension 03/06/2016   Mixed hyperlipidemia 03/06/2016   Right thyroid nodule 03/06/2016   Seizures ([place]) 03/06/2016   Fever 03/12/2015   Recurrent erosion of cornea 06/23/2011    Current Outpatient Medications:    acetaminophen (TYLENOL) 500 MG tablet, Take 1,000 mg by mouth at bedtime as needed for mild  pain or headache., Disp: , Rfl:    Carboxymethylcellulose Sod PF 0.5 % SOLN, Place 1 drop into both eyes 4 (four) times daily as needed (dry eyes)., Disp: , Rfl:    Cholecalciferol (VITAMIN D3) 25 MCG (1000 UT) CAPS, Take 1,000 Units by mouth daily., Disp: , Rfl:    digoxin (LANOXIN) 0.125 MG tablet, Take 1 tablet (0.125 mg total) by mouth daily., Disp: 30 tablet, Rfl: 3   empagliflozin (JARDIANCE) 10 MG TABS tablet, Take 1 tablet (10 mg total) by mouth  daily before breakfast., Disp: 90 tablet, Rfl: 3   escitalopram (LEXAPRO) 20 MG tablet, Take 20 mg by mouth daily. Takes at bedtime., Disp: , Rfl:    finasteride (PROSCAR) 5 MG tablet, Take 5 mg by mouth daily at 12 noon. , Disp: , Rfl:    fluticasone (FLONASE) 50 MCG/ACT nasal spray, Place 1 spray into both nostrils 2 (two) times daily., Disp: , Rfl:    loratadine (CLARITIN) 10 MG tablet, Take 10 mg by mouth daily., Disp: , Rfl:    losartan (COZAAR) 25 MG tablet, Take 0.5 tablets (12.5 mg total) by mouth daily., Disp: 45 tablet, Rfl: 3   metoprolol tartrate (LOPRESSOR) 100 MG tablet, Take 1 tablet (100 mg total) by mouth 2 (two) times daily., Disp: 180 tablet, Rfl: 2   Multiple Vitamin (MULTIVITAMIN WITH MINERALS) TABS tablet, Take 1 tablet by mouth daily at 12 noon., Disp: , Rfl:    PHENobarbital (LUMINAL) 97.2 MG tablet, Take 97.2 mg by mouth daily. Takes between 3:00-5:00pm., Disp: , Rfl:    potassium chloride SA (KLOR-CON M) 20 MEQ tablet, Take 1 tablet (20 mEq total) by mouth daily., Disp: 90 tablet, Rfl: 3   pravastatin (PRAVACHOL) 40 MG tablet, Take 40 mg by mouth at bedtime. , Disp: , Rfl:    spironolactone (ALDACTONE) 25 MG tablet, Take 0.5 tablets (12.5 mg total) by mouth daily., Disp: 45 tablet, Rfl: 2   torsemide 60 MG TABS, Take 60 mg by mouth daily., Disp: 30 tablet, Rfl: 1   triamcinolone cream (KENALOG) 0.1 %, Apply 1 application  topically 2 (two) times daily as needed (rash)., Disp: , Rfl:    warfarin (COUMADIN) 4 MG tablet, Take 4 mg by mouth 2 (two) times daily., Disp: , Rfl:  Allergies  Allergen Reactions   Dilaudid [Hydromorphone Hcl] Other (See Comments)    Makes pt hyper    Hydromorphone Other (See Comments)    hyperactiviity Other reaction(s): made him wild Other reaction(s): Unknown   Morphine Sulfate     Other reaction(s): at high dose causes confusion Other reaction(s): at high dose causes confusion   Tegretol [Carbamazepine] Hives   Carbamazepine Hives and  Rash    Other reaction(s): hives Other reaction(s): Hives Other reaction(s): hives     Social History   Socioeconomic History   Marital status: Married    Spouse name: Not on file   Number of children: Not on file   Years of education: Not on file   Highest education level: Not on file  Occupational History   Not on file  Tobacco Use   Smoking status: Former    Packs/day: 3.00    Years: 48.00    Total pack years: 144.00    Types: Cigarettes    Quit date: 2000    Years since quitting: 23.5   Smokeless tobacco: Never  Vaping Use   Vaping Use: Never used  Substance and Sexual Activity   Alcohol use: Never   Drug use:  Never   Sexual activity: Never  Other Topics Concern   Not on file  Social History Narrative   ** Merged History Encounter **       Social Determinants of Health   Financial Resource Strain: Medium Risk (09/26/2018)   Overall Financial Resource Strain (CARDIA)    Difficulty of Paying Living Expenses: Somewhat hard  Food Insecurity: No Food Insecurity (06/03/2021)   Hunger Vital Sign    Worried About Running Out of Food in the Last Year: Never true    Ran Out of Food in the Last Year: Never true  Transportation Needs: No Transportation Needs (04/26/2021)   PRAPARE - Hydrologist (Medical): No    Lack of Transportation (Non-Medical): No  Physical Activity: Inactive (12/15/2019)   Exercise Vital Sign    Days of Exercise per Week: 0 days    Minutes of Exercise per Session: 0 min  Stress: Stress Concern Present (09/26/2018)   [place]    Feeling of Stress : To some extent  Social Connections: Not on file  Intimate Partner Violence: Not on file    Physical Exam      Future Appointments  Date Time Provider [place]  09/27/2021 11:20 AM Baldwin [place], PA-C [facility]  10/24/2021 11:00 AM Bensimhon, Shaune Pascal, MD [facility] None   11/14/2021  3:00 PM [name], MD [facility]     ACTION: Home visit completed

## 2021-09-21 DIAGNOSIS — M109 Gout, unspecified: Secondary | ICD-10-CM | POA: Diagnosis not present

## 2021-09-23 NOTE — Telephone Encounter (Signed)
Aware that I am going to confirm 8/18 is ok w/ Dr. Curt Bears. Aware I will let him know next week if MD agreeable. Verbal consent obtained.

## 2021-09-25 NOTE — Progress Notes (Unsigned)
 Cardiology Office Note Date:  09/25/2021  Patient ID:  Ruben Murray, DOB 04/07/1945, MRN [id number] PCP:  Hagler, Rachel, MD  Cardiologist:  Dr. Skains Electrophysiologist: Dr. camnitz    Chief Complaint: scheduled visit  History of Present Illness: Ruben Murray is a 76 y.o. male with history of obesity, HTN, OSA, seizure d/o, permanent AFib, DCM (?tachy mediated), chronic CHF (systolic), PE, p.HTN  He comes in today to be seen for Dr. Camnitz, seen 06/20/21, he had been in the hospital March and metoprolol, dig  added for better AF control. He had moved to a new appt recently, though bed bugs from his olkd placed followed along and was pending an extermination. Volume status remained OK He was rate controlled at the visit and they discussed prior recommendations PPM (or ICD) and AV node ablation (CANCELLED previously 2/2 bedbugs) though with rate controlled currently planned to hold off.    Hospitalized 07/26/21 when paramedicine noted his HR 120's, in the ER 150's, and found with small subsegmental PE b/l, RV with signs of strain,  started on IV heparin Dr. Skains felt RV strain more likely 2/2 elevated RA pressures in setting of LV dysfunction, not small PEs, continued coumadin (unable to use OACs 2/2 his chronic phenobarbital) Expected his rates would improve with PE and CHF management DIG STOPPED with elevated dig level on 0.25mg ??  Restarted 0.125mg There was discussion of changing to lovenox by hematology but felt to be financially unable to do and warfarin continued Discharged 08/01/21  He saw the HF team 08/16/21, doing better, had run out of his dig a few days, otherwise good medicine compliance, SOB with longer distances only Entresto cost prohibative and started on losartan, dig resumed Planned f/u with EP to revisit AV node ablation/device Getting para medicine, the pt apparently caregiver forhis wife with advancing dementia.  Last [place]chos 07/27/21: LVEF 20% 05/18/21:  20-25% 8.31.21: 20-25%  I saw him 08/31/21 He is accompanied by his neighbor and friend today He is doing pretty well, denies any CP, palpitations or cardiac awareness He has no rest SOB but gets winded pretty easily, walking in the house gets him SOB, this he reports his baseline for quite a long time now. No near syncope or syncope. No bleeding or signs of bleeding He was rate controlled He was not felt to be volume OL, revisited ICD implant and perhaps Av node ablation in the future, planned to have Dr. Camnitz's nurse to reach out to schedule given he would need to figure out a ride/arrangements  Looks like planned for 8/18 implant.  Paramedicine sees him, had not take his dig in a couple weeks, friends that help him were going to pick it up from the pharmacy  TODAY He feels well. Has been out and about today with errands, has not yet taken his AM meds. NO CP Gets winded with exertion, but able to get his ADLs done with minimal difficulty  No near syncope or syncope No bleeding  We revisited his his procedure, answered his f/u questions and he remains agreeable   Device information Diagnosed jan 2020 in the environment of PNA Initial DCCV failed and started on amiodarone 2020 Off amiodarone > permament AF  Past Medical History:  Diagnosis Date   Acquired thrombophilia (HCC) 01/15/2019   Aortic atherosclerosis (HCC)    Aortic root dilatation (HCC)    CAP (community acquired pneumonia) 03/06/2016   Chronic laryngitis 10/03/2016   Chronic systolic CHF (congestive heart failure) (HCC)      COPD GOLD II  08/02/2016   Quit smoking 2000  PFT's  07/10/2016  FEV1 1.98 (70 % ) ratio 67  p 19 % improvement from saba p nothing  prior to study while of coreg so rec as of 08/02/2016 try off coreg and on bisoprolol      Essential hypertension 03/06/2016   Changed from coreg to bisoprolol due to copd with reversible component  08/02/2016 >>>    Hoarseness 08/31/2016   Referred to ent  08/31/2016 >>> seen 10/03/16 [place] dx gerd and rhinitis medicamentosa   > improved on f/u 01/31/17 on gerd rx/ flonase and off afrin   Hyperlipidemia    Hypertension    Laryngopharyngeal reflux (LPR) 10/03/2016   Mitral regurgitation    Moderate persistent asthma 08/02/2016   Morbid (severe) obesity due to excess calories (HCC) 08/02/2016   Nasal polyps 10/03/2016   NICM (nonischemic cardiomyopathy) (HCC)    a. EF 40-45% in 04/2016, etiology not defined, managed medically.   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   Rhinitis medicamentosa 10/03/2016   Right thyroid nodule 03/06/2016   Seizures (HCC)    "take RX daily" (03/06/2016)   Sensorineural hearing loss (SNHL) of both ears 01/31/2017   Tricuspid regurgitation     Past Surgical History:  Procedure Laterality Date   CARDIOVERSION N/A 12/13/2017   Procedure: CARDIOVERSION;  Surgeon: [place], Tiffany, MD;  [facility];  Service: Cardiovascular;  Laterality: N/A;   CARDIOVERSION N/A 09/25/2018   Procedure: CARDIOVERSION;  Surgeon: Hilty, Kenneth C, MD;  Location: [facility];  Service: Cardiovascular;  Laterality: N/A;   CARDIOVERSION N/A 11/04/2018   Procedure: CARDIOVERSION;  Surgeon: Nahser, Philip J, MD;  Location: [facility];  Service: Cardiovascular;  Laterality: N/A;   CARDIOVERSION N/A 04/11/2019   Procedure: CARDIOVERSION;  Surgeon: Crenshaw, Brian S, MD;  Location: [facility];  Service: Cardiovascular;  Laterality: N/A;   CIRCUMCISION     JOINT REPLACEMENT     PERCUTANEOUS PINNING TOE FRACTURE Left    "big toe   REPLACEMENT TOTAL KNEE Left    RIGHT/LEFT HEART CATH AND CORONARY ANGIOGRAPHY N/A 11/06/2019   Procedure: RIGHT/LEFT HEART CATH AND CORONARY ANGIOGRAPHY;  Surgeon: Kelly, Thomas A, MD;  Location[facility];  Service: Cardiovascular;  Laterality: N/A;   TONSILLECTOMY AND ADENOIDECTOMY      Current  Outpatient Medications  Medication Sig Dispense Refill   acetaminophen (TYLENOL) 500 MG tablet Take 1,000 mg by mouth at bedtime as needed for mild pain or headache.     Carboxymethylcellulose Sod PF 0.5 % SOLN Place 1 drop into both eyes 4 (four) times daily as needed (dry eyes).     Cholecalciferol (VITAMIN D3) 25 MCG (1000 UT) CAPS Take 1,000 Units by mouth daily.     digoxin (LANOXIN) 0.125 MG tablet Take 1 tablet (0.125 mg total) by mouth daily. 30 tablet 3   empagliflozin (JARDIANCE) 10 MG TABS tablet Take 1 tablet (10 mg total) by mouth daily before breakfast. 90 tablet 3   escitalopram (LEXAPRO) 20 MG tablet Take 20 mg by mouth daily. Takes at bedtime.     finasteride (PROSCAR) 5 MG tablet Take 5 mg by mouth daily at 12 noon.      fluticasone (FLONASE) 50 MCG/ACT nasal spray Place 1 spray into both nostrils 2 (two) times daily.     loratadine (CLARITIN) 10 MG tablet   Take 10 mg by mouth daily.     losartan (COZAAR) 25 MG tablet Take 0.5 tablets (12.5 mg total) by mouth daily. 45 tablet 3   metoprolol tartrate (LOPRESSOR) 100 MG tablet Take 1 tablet (100 mg total) by mouth 2 (two) times daily. 180 tablet 2   Multiple Vitamin (MULTIVITAMIN WITH MINERALS) TABS tablet Take 1 tablet by mouth daily at 12 noon.     PHENobarbital (LUMINAL) 97.2 MG tablet Take 97.2 mg by mouth daily. Takes between 3:00-5:00pm.     potassium chloride SA (KLOR-CON M) 20 MEQ tablet Take 1 tablet (20 mEq total) by mouth daily. 90 tablet 3   pravastatin (PRAVACHOL) 40 MG tablet Take 40 mg by mouth at bedtime.      spironolactone (ALDACTONE) 25 MG tablet Take 0.5 tablets (12.5 mg total) by mouth daily. 45 tablet 2   torsemide 60 MG TABS Take 60 mg by mouth daily. 30 tablet 1   triamcinolone cream (KENALOG) 0.1 % Apply 1 application  topically 2 (two) times daily as needed (rash).     warfarin (COUMADIN) 4 MG tablet Take 4 mg by mouth 2 (two) times daily.     No current facility-administered medications for this visit.     Allergies:   Dilaudid [hydromorphone hcl], Hydromorphone, Morphine sulfate, Tegretol [carbamazepine], and Carbamazepine   Social History:  The patient  reports that he quit smoking about 23 years ago. His smoking use included cigarettes. He has a 144.00 pack-year smoking history. He has never used smokeless tobacco. He reports that he does not drink alcohol and does not use drugs.   Family History:  The patient's family history includes Hypertension in his mother; Other in his father.  ROS:  Please see the history of present illness.    All other systems are reviewed and otherwise negative.   PHYSICAL EXAM:  VS:  There were no vitals taken for this visit. BMI: There is no height or weight on file to calculate BMI. Well nourished, well developed, in no acute distress HEENT: normocephalic, atraumatic Neck: no JVD, carotid bruits or masses Cardiac:  irreg-irreg; no significant murmurs, no rubs, or gallops Lungs:  CTA b/l, no wheezing, rhonchi or rales Abd: soft, nontender MS: no deformity or atrophy Ext: no edema Skin: warm and dry, no rash Neuro:  No gross deficits appreciated Psych: euthymic mood, full affect   EKG:  not done today 08/31/21: AFib 72bpm, RBBB, QRS 156bpm  07/27/21: TTE  1. Left ventricular ejection fraction, by estimation, is 20%. The left  ventricle has severely decreased function. The left ventricle demonstrates  global hypokinesis. The left ventricular internal cavity size was mildly  dilated. Left ventricular  diastolic parameters are indeterminate.   2. Right ventricular systolic function is moderately reduced. The right  ventricular size is severely enlarged. There is normal pulmonary artery  systolic pressure. The estimated right ventricular systolic pressure is  62.2 mmHg.   3. Left atrial size was mildly dilated.   4. Right atrial size was moderately dilated.   5. The mitral valve is normal in structure. Mild mitral valve  regurgitation. No evidence  of mitral stenosis.   6. Tricuspid valve regurgitation is moderate to severe.   7. The aortic valve is tricuspid. There is mild calcification of the  aortic valve. Aortic valve regurgitation is mild. Aortic valve sclerosis  is present, with no evidence of aortic valve stenosis.   8. Aortic dilatation noted. There is mild dilatation of the aortic root,  measuring 37  mm.   9. The inferior vena cava is dilated in size with <50% respiratory  variability, suggesting right atrial pressure of 15 mmHg.  10. The patient was in atrial fibrillation.   11/06/19: LHC Findings are consistent with a dilated nonischemic cardiomyopathy. Normal coronary arteries with a codominant system. Mild right heart pressure elevation with mild pulmonary hypertension; PA 34/22, mean PA pressure 28 mm. Atrial flutter with variable block and rapid ventricular response.   RECOMMENDATION:  Guideline directed medical therapy for nonischemic cardiomyopathy.  In this patient with recent Covid 19 positivity consider post Covid myocarditis.   Recent Labs: 04/19/2021: TSH 1.898 07/26/2021: B Natriuretic Peptide 753.5 07/29/2021: ALT 16 08/01/2021: Magnesium 2.4 08/16/2021: BUN 33; Creatinine, Ser 1.41; Hemoglobin 13.0; Platelets 225; Potassium 4.2; Sodium 142  07/30/2021: Cholesterol 110; HDL 38; LDL Cholesterol 59; Total CHOL/HDL Ratio 2.9; Triglycerides 67; VLDL 13   CrCl cannot be calculated (Patient's most recent lab result is older than the maximum 21 days allowed.).   Wt Readings from Last 3 Encounters:  09/20/21 225 lb (102.1 kg)  09/13/21 224 lb (101.6 kg)  08/31/21 230 lb 6.4 oz (104.5 kg)     Other studies reviewed: Additional studies/records reviewed today include: summarized above  ASSESSMENT AND PLAN:  Permanent AFib On warfarin Rate OK again today Will hold of AV node ablation plans unless we run into trouble again with rate  DCM Chronic CHF Scheduled for CRT-D Pre-op labs today including dig  level Pre-op scrub given to him today Dr. Camnitz's RN has him on her radar and will review his pre-op instructions further and post-op appts     Disposition: usual post procedure care and follow up  Current medicines are reviewed at length with the patient today.  The patient did not have any concerns regarding medicines.  Signed, Akashdeep Chuba, PA-C 09/25/2021 2:36 PM     [facility] [address] [place]  [address] [phone] (office)  [phone] (fax)  

## 2021-09-25 NOTE — H&P (View-Only) (Signed)
Cardiology Office Note Date:  09/25/2021  Patient ID:  Ruben Murray, Ruben Murray 1945/04/26, MRN [id number] PCP:  [name], MD  Cardiologist:  Dr. [name] Electrophysiologist: Dr. Curt Bears    Chief Complaint: scheduled visit  History of Present Illness: Ruben Murray is a 76 y.o. male with history of obesity, HTN, OSA, seizure d/o, permanent AFib, DCM (?tachy mediated), chronic CHF (systolic), PE, p.HTN  He comes in today to be seen for Dr. Curt Bears, seen 06/20/21, he had been in the hospital March and metoprolol, dig  added for better AF control. He had moved to a new appt recently, though bed bugs from his olkd placed followed along and was pending an extermination. Volume status remained OK He was rate controlled at the visit and they discussed prior recommendations PPM (or ICD) and AV node ablation (CANCELLED previously 2/2 bedbugs) though with rate controlled currently planned to hold off.    Hospitalized 07/26/21 when paramedicine noted his HR 120's, in the ER 150's, and found with small subsegmental PE b/l, RV with signs of strain,  started on IV heparin Dr. [name] felt RV strain more likely 2/2 elevated RA pressures in setting of LV dysfunction, not small PEs, continued coumadin (unable to use OACs 2/2 his chronic phenobarbital) Expected his rates would improve with PE and CHF management DIG STOPPED with elevated dig level on 0.57m ??  Restarted 0.1239mThere was discussion of changing to lovenox by hematology but felt to be financially unable to do and warfarin continued Discharged 08/01/21  He saw the HF team 08/16/21, doing better, had run out of his dig a few days, otherwise good medicine compliance, SOB with longer distances only Entresto cost prohibative and started on losartan, dig resumed Planned f/u with EP to revisit AV node ablation/device Getting para medicine, the pt apparently caregiver forhis wife with advancing dementia.  Last Echos 07/27/21: LVEF 20% 05/18/21:  20-25% 8.31.21: 20-25%  I saw him 08/31/21 He is accompanied by his neighbor and friend today He is doing pretty well, denies any CP, palpitations or cardiac awareness He has no rest SOB but gets winded pretty easily, walking in the house gets him SOB, this he reports his baseline for quite a long time now. No near syncope or syncope. No bleeding or signs of bleeding He was rate controlled He was not felt to be volume OL, revisited ICD implant and perhaps Av node ablation in the future, planned to have Dr. CaMacky Lowerurse to reach out to schedule given he would need to figure out a ride/arrangements  Looks like planned for 8/18 implant.  Paramedicine sees him, had not take his dig in a couple weeks, friends that help him were going to pick it up from the pharmacy  TODAY He feels well. Has been out and about today with errands, has not yet taken his AM meds. NO CP Gets winded with exertion, but able to get his ADLs done with minimal difficulty  No near syncope or syncope No bleeding  We revisited his his procedure, answered his f/u questions and he remains agreeable   Device information Diagnosed jan 2020 in the environment of PNA Initial DCCV failed and started on amiodarone 2020 Off amiodarone > permament AF  Past Medical History:  Diagnosis Date   Acquired thrombophilia ([facility]11/01/2019   Aortic atherosclerosis ([facility] [place]   Aortic root dilatation ([facility]   CAP (community acquired pneumonia) 03/06/2016   Chronic laryngitis 0756/15/3794 Chronic systolic CHF (congestive heart failure) (HCM[facility]  COPD GOLD II  08/02/2016   Quit smoking 2000  PFT's  07/10/2016  FEV1 1.98 (70 % ) ratio 67  p 19 % improvement from saba p nothing  prior to study while of coreg so rec as of 08/02/2016 try off coreg and on bisoprolol      Essential hypertension 03/06/2016   Changed from coreg to bisoprolol due to copd with reversible component  08/02/2016 >>>    Hoarseness 08/31/2016   Referred to ent  08/31/2016 >>> seen 10/03/16 [name] dx gerd and rhinitis medicamentosa   > improved on f/u 01/31/17 on gerd rx/ flonase and off afrin   Hyperlipidemia    Hypertension    Laryngopharyngeal reflux (LPR) 10/03/2016   Mitral regurgitation    Moderate persistent asthma 08/02/2016   Morbid (severe) obesity due to excess calories ([place]) 08/02/2016   Nasal polyps 10/03/2016   NICM (nonischemic cardiomyopathy) ([place])    a. EF 40-45% in 04/2016, etiology not defined, managed medically.   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   Rhinitis medicamentosa 10/03/2016   Right thyroid nodule 03/06/2016   Seizures ([place])    "take RX daily" (03/06/2016)   Sensorineural hearing loss (SNHL) of both ears 01/31/2017   Tricuspid regurgitation     Past Surgical History:  Procedure Laterality Date   CARDIOVERSION N/A 12/13/2017   Procedure: CARDIOVERSION;  Surgeon: [name], MD;  Location: [place];  Service: Cardiovascular;  Laterality: N/A;   CARDIOVERSION N/A 09/25/2018   Procedure: CARDIOVERSION;  Surgeon: [name], MD;  Location: [facility] ENDOSCOPY;  Service: Cardiovascular;  Laterality: N/A;   CARDIOVERSION N/A 11/04/2018   Procedure: CARDIOVERSION;  Surgeon: [name], MD;  Location: [facility] ENDOSCOPY;  Service: Cardiovascular;  Laterality: N/A;   CARDIOVERSION N/A 04/11/2019   Procedure: CARDIOVERSION;  Surgeon: [name], MD;  Location: [facility] ENDOSCOPY;  Service: Cardiovascular;  Laterality: N/A;   CIRCUMCISION     JOINT REPLACEMENT     PERCUTANEOUS PINNING TOE FRACTURE Left    "big toe   REPLACEMENT TOTAL KNEE Left    RIGHT/LEFT HEART CATH AND CORONARY ANGIOGRAPHY N/A 11/06/2019   Procedure: RIGHT/LEFT HEART CATH AND CORONARY ANGIOGRAPHY;  Surgeon: [name], MD;  Location: [place] CV LAB;  Service: Cardiovascular;  Laterality: N/A;   TONSILLECTOMY AND ADENOIDECTOMY      Current  Outpatient Medications  Medication Sig Dispense Refill   acetaminophen (TYLENOL) 500 MG tablet Take 1,000 mg by mouth at bedtime as needed for mild pain or headache.     Carboxymethylcellulose Sod PF 0.5 % SOLN Place 1 drop into both eyes 4 (four) times daily as needed (dry eyes).     Cholecalciferol (VITAMIN D3) 25 MCG (1000 UT) CAPS Take 1,000 Units by mouth daily.     digoxin (LANOXIN) 0.125 MG tablet Take 1 tablet (0.125 mg total) by mouth daily. 30 tablet 3   empagliflozin (JARDIANCE) 10 MG TABS tablet Take 1 tablet (10 mg total) by mouth daily before breakfast. 90 tablet 3   escitalopram (LEXAPRO) 20 MG tablet Take 20 mg by mouth daily. Takes at bedtime.     finasteride (PROSCAR) 5 MG tablet Take 5 mg by mouth daily at 12 noon.      fluticasone (FLONASE) 50 MCG/ACT nasal spray Place 1 spray into both nostrils 2 (two) times daily.     loratadine (CLARITIN) 10 MG tablet  Take 10 mg by mouth daily.     losartan (COZAAR) 25 MG tablet Take 0.5 tablets (12.5 mg total) by mouth daily. 45 tablet 3   metoprolol tartrate (LOPRESSOR) 100 MG tablet Take 1 tablet (100 mg total) by mouth 2 (two) times daily. 180 tablet 2   Multiple Vitamin (MULTIVITAMIN WITH MINERALS) TABS tablet Take 1 tablet by mouth daily at 12 noon.     PHENobarbital (LUMINAL) 97.2 MG tablet Take 97.2 mg by mouth daily. Takes between 3:00-5:00pm.     potassium chloride SA (KLOR-CON M) 20 MEQ tablet Take 1 tablet (20 mEq total) by mouth daily. 90 tablet 3   pravastatin (PRAVACHOL) 40 MG tablet Take 40 mg by mouth at bedtime.      spironolactone (ALDACTONE) 25 MG tablet Take 0.5 tablets (12.5 mg total) by mouth daily. 45 tablet 2   torsemide 60 MG TABS Take 60 mg by mouth daily. 30 tablet 1   triamcinolone cream (KENALOG) 0.1 % Apply 1 application  topically 2 (two) times daily as needed (rash).     warfarin (COUMADIN) 4 MG tablet Take 4 mg by mouth 2 (two) times daily.     No current facility-administered medications for this visit.     Allergies:   Dilaudid [hydromorphone hcl], Hydromorphone, Morphine sulfate, Tegretol [carbamazepine], and Carbamazepine   Social History:  The patient  reports that he quit smoking about 23 years ago. His smoking use included cigarettes. He has a 144.00 pack-year smoking history. He has never used smokeless tobacco. He reports that he does not drink alcohol and does not use drugs.   Family History:  The patient's family history includes Hypertension in his mother; Other in his father.  ROS:  Please see the history of present illness.    All other systems are reviewed and otherwise negative.   PHYSICAL EXAM:  VS:  There were no vitals taken for this visit. BMI: There is no height or weight on file to calculate BMI. Well nourished, well developed, in no acute distress HEENT: normocephalic, atraumatic Neck: no JVD, carotid bruits or masses Cardiac:  irreg-irreg; no significant murmurs, no rubs, or gallops Lungs:  CTA b/l, no wheezing, rhonchi or rales Abd: soft, nontender MS: no deformity or atrophy Ext: no edema Skin: warm and dry, no rash Neuro:  No gross deficits appreciated Psych: euthymic mood, full affect   EKG:  not done today 08/31/21: AFib 72bpm, RBBB, QRS 156bpm  07/27/21: TTE  1. Left ventricular ejection fraction, by estimation, is 20%. The left  ventricle has severely decreased function. The left ventricle demonstrates  global hypokinesis. The left ventricular internal cavity size was mildly  dilated. Left ventricular  diastolic parameters are indeterminate.   2. Right ventricular systolic function is moderately reduced. The right  ventricular size is severely enlarged. There is normal pulmonary artery  systolic pressure. The estimated right ventricular systolic pressure is  62.2 mmHg.   3. Left atrial size was mildly dilated.   4. Right atrial size was moderately dilated.   5. The mitral valve is normal in structure. Mild mitral valve  regurgitation. No evidence  of mitral stenosis.   6. Tricuspid valve regurgitation is moderate to severe.   7. The aortic valve is tricuspid. There is mild calcification of the  aortic valve. Aortic valve regurgitation is mild. Aortic valve sclerosis  is present, with no evidence of aortic valve stenosis.   8. Aortic dilatation noted. There is mild dilatation of the aortic root,  measuring 37  mm.   9. The inferior vena cava is dilated in size with <50% respiratory  variability, suggesting right atrial pressure of 15 mmHg.  10. The patient was in atrial fibrillation.   11/06/19: LHC Findings are consistent with a dilated nonischemic cardiomyopathy. Normal coronary arteries with a codominant system. Mild right heart pressure elevation with mild pulmonary hypertension; PA 34/22, mean PA pressure 28 mm. Atrial flutter with variable block and rapid ventricular response.   RECOMMENDATION:  Guideline directed medical therapy for nonischemic cardiomyopathy.  In this patient with recent Covid 19 positivity consider post Covid myocarditis.   Recent Labs: 04/19/2021: TSH 1.898 07/26/2021: B Natriuretic Peptide 753.5 07/29/2021: ALT 16 08/01/2021: Magnesium 2.4 08/16/2021: BUN 33; Creatinine, Ser 1.41; Hemoglobin 13.0; Platelets 225; Potassium 4.2; Sodium 142  07/30/2021: Cholesterol 110; HDL 38; LDL Cholesterol 59; Total CHOL/HDL Ratio 2.9; Triglycerides 67; VLDL 13   CrCl cannot be calculated (Patient's most recent lab result is older than the maximum 21 days allowed.).   Wt Readings from Last 3 Encounters:  09/20/21 225 lb (102.1 kg)  09/13/21 224 lb (101.6 kg)  08/31/21 230 lb 6.4 oz (104.5 kg)     Other studies reviewed: Additional studies/records reviewed today include: summarized above  ASSESSMENT AND PLAN:  Permanent AFib On warfarin Rate OK again today Will hold of AV node ablation plans unless we run into trouble again with rate  DCM Chronic CHF Scheduled for CRT-D Pre-op labs today including dig  level Pre-op scrub given to him today Dr. Macky Lower RN has him on her radar and will review his pre-op instructions further and post-op appts     Disposition: usual post procedure care and follow up  Current medicines are reviewed at length with the patient today.  The patient did not have any concerns regarding medicines.  Venetia Night, PA-C 09/25/2021 2:36 PM     [address] [place] [id number] [phone] (office)  [phone] (fax)

## 2021-09-27 ENCOUNTER — Encounter: Payer: Self-pay | Admitting: Physician Assistant

## 2021-09-27 ENCOUNTER — Ambulatory Visit: Payer: Medicare PPO | Admitting: Physician Assistant

## 2021-09-27 ENCOUNTER — Other Ambulatory Visit (HOSPITAL_COMMUNITY): Payer: Self-pay

## 2021-09-27 VITALS — BP 122/60 | HR 82 | Ht 68.0 in | Wt 228.6 lb

## 2021-09-27 DIAGNOSIS — I5023 Acute on chronic systolic (congestive) heart failure: Secondary | ICD-10-CM

## 2021-09-27 DIAGNOSIS — I4821 Permanent atrial fibrillation: Secondary | ICD-10-CM | POA: Diagnosis not present

## 2021-09-27 DIAGNOSIS — G4733 Obstructive sleep apnea (adult) (pediatric): Secondary | ICD-10-CM | POA: Diagnosis not present

## 2021-09-27 DIAGNOSIS — I10 Essential (primary) hypertension: Secondary | ICD-10-CM | POA: Diagnosis not present

## 2021-09-27 DIAGNOSIS — Z9989 Dependence on other enabling machines and devices: Secondary | ICD-10-CM | POA: Diagnosis not present

## 2021-09-27 LAB — PROTIME-INR
INR: 4.2 — ABNORMAL HIGH (ref 0.9–1.2)
Prothrombin Time: 39.5 s — ABNORMAL HIGH (ref 9.1–12.0)

## 2021-09-27 NOTE — Patient Instructions (Addendum)
Medication Instructions:  Your physician recommends that you continue on your current medications as directed. Please refer to the Current Medication list given to you today.  Labwork:  You will have blood work drawn today:  CBC, BMET Digoxin level, and INR.  Testing/Procedures: None ordered.   Follow-Up:   Your physician wants you to follow-up:  Dr. Curt Bears office will be contacting you soon regarding your upcoming procedure.     Any Other Special Instructions Will Be Listed Below (If Applicable).  If you need a refill on your cardiac medications before your next appointment, please call your pharmacy.   Important Information About Sugar

## 2021-09-27 NOTE — Progress Notes (Signed)
Paramedicine Encounter    Patient ID: Ruben Murray, male    DOB: Apr 08, 1945, 76 y.o.   MRN: [id number]   Arrived for home visit for Ruben Murray. He was sitting his recliner, alert and oriented reporting to be feeling good. He denied any pain, dizziness or increased shortness of breath. He reports he had a severe case of gout last week and was placed on prednisone taper- he is taking 74m today and then 178mfor the next 7 days. He states this is helping and his pain has improved.   Vitals were obtained: WT- 227.6lbs BP- 120/82 HR- 100 O2- 100% RR- 18  Meds were reviewed and confirmed. I filled pill box for one week including his prednisone. I set aside his medications for todays morning dose- he plans to take these after breakfast.   Lungs clear, swelling improved from last week. No pitting edema noted. He denied any fluttering or palpitations over the last week.   We reviewed [facility] I will review notes and if any med changes I will coordinate with patient's caregiver or come back out for a med rec.   Ruben Murray no other needs at this time. I will follow up for home visit in one week.   Ruben Murray      Patient Care Team: HaCaren MacadamMD as PCP - General (Family Medicine) SkJerline PainMD as PCP - Cardiology (Cardiology) CaConstance HawMD as PCP - Electrophysiology (Cardiology) HaCaren MacadamMD (Family Medicine) [facility] as Case Manager (Hospice and Palliative Medicine)  Patient Active Problem List   Diagnosis Date Noted   AKI (acute kidney injury) (HCDra[place]05/24/2023   Acute pulmonary embolism (HCCrest[place]05/23/2023   Acute on chronic systolic CHF (congestive heart failure) (HCTierra [place]03/14/2023   Hypercoagulable state (HCNew King[place]03/09/2021   Left leg pain 04/24/2021   OSA on CPAP 04/24/2021   Mitral regurgitation 04/24/2021   Tricuspid  regurgitation 04/24/2021   Dilated aortic root (HCWaubek[place]02/19/2023   Permanent atrial fibrillation (HCShonto02/14/2023   Chronic arthropathy 04/13/2020   Callus 04/13/2020   Onychomycosis 12/12/2019   Left leg cellulitis 1028/41/3244 Chronic systolic heart failure (HCEton10/07/2019   Dilated cardiomyopathy (HCMiddle[place]   Atrial flutter (HCWit[place]08/30/2021   Atrial flutter with rapid ventricular response (HCHart[place]08/30/2021   Acquired thrombophilia (HCCountry Club Hills11/01/2019   Atrial fibrillation with rapid ventricular response (HCLake [place]08/26/2019   Chronic combined systolic and diastolic heart failure (HCEthridge08/26/2019   Prolonged QT interval 10/29/2017   Sensorineural hearing loss (SNHL) of both ears 01/31/2017   Dysphonia 10/03/2016   Laryngopharyngeal reflux (LPR) 10/03/2016   Nasal polyps 10/03/2016   Rhinitis medicamentosa 10/03/2016   Chronic laryngitis 10/03/2016   Hoarseness 08/31/2016   Moderate persistent asthma 08/02/2016   COPD (chronic obstructive pulmonary disease) (HCSouth [place]05/30/2018   Morbid (severe) obesity due to excess calories (HCHawesville05/30/2018   Community acquired pneumonia 03/06/2016   Essential hypertension 03/06/2016   Mixed hyperlipidemia 03/06/2016   Right thyroid nodule 03/06/2016   Seizures (HCMuniz01/03/2016   Fever 03/12/2015   Recurrent erosion of cornea 06/23/2011    Current Outpatient Medications:    acetaminophen (TYLENOL) 500 MG tablet, Take 1,000 mg by mouth at bedtime as needed for mild pain or headache., Disp: , Rfl:    Carboxymethylcellulose Sod PF 0.5 % SOLN, Place 1 drop into both eyes 4 (four) times daily as needed (  dry eyes)., Disp: , Rfl:    Cholecalciferol (VITAMIN D3) 25 MCG (1000 UT) CAPS, Take 1,000 Units by mouth daily., Disp: , Rfl:    digoxin (LANOXIN) 0.125 MG tablet, Take 1 tablet (0.125 mg total) by mouth daily., Disp: 30 tablet, Rfl: 3   empagliflozin (JARDIANCE) 10 MG TABS tablet, Take 1 tablet (10 mg total) by mouth daily before breakfast., Disp: 90 tablet,  Rfl: 3   escitalopram (LEXAPRO) 20 MG tablet, Take 20 mg by mouth daily. Takes at bedtime., Disp: , Rfl:    finasteride (PROSCAR) 5 MG tablet, Take 5 mg by mouth daily at 12 noon. , Disp: , Rfl:    fluticasone (FLONASE) 50 MCG/ACT nasal spray, Place 1 spray into both nostrils 2 (two) times daily., Disp: , Rfl:    loratadine (CLARITIN) 10 MG tablet, Take 10 mg by mouth daily., Disp: , Rfl:    losartan (COZAAR) 25 MG tablet, Take 0.5 tablets (12.5 mg total) by mouth daily., Disp: 45 tablet, Rfl: 3   metoprolol tartrate (LOPRESSOR) 100 MG tablet, Take 1 tablet (100 mg total) by mouth 2 (two) times daily., Disp: 180 tablet, Rfl: 2   Multiple Vitamin (MULTIVITAMIN WITH MINERALS) TABS tablet, Take 1 tablet by mouth daily at 12 noon., Disp: , Rfl:    PHENobarbital (LUMINAL) 97.2 MG tablet, Take 97.2 mg by mouth daily. Takes between 3:00-5:00pm., Disp: , Rfl:    potassium chloride SA (KLOR-CON M) 20 MEQ tablet, Take 1 tablet (20 mEq total) by mouth daily., Disp: 90 tablet, Rfl: 3   pravastatin (PRAVACHOL) 40 MG tablet, Take 40 mg by mouth at bedtime. , Disp: , Rfl:    spironolactone (ALDACTONE) 25 MG tablet, Take 0.5 tablets (12.5 mg total) by mouth daily., Disp: 45 tablet, Rfl: 2   torsemide 60 MG TABS, Take 60 mg by mouth daily., Disp: 30 tablet, Rfl: 1   triamcinolone cream (KENALOG) 0.1 %, Apply 1 application  topically 2 (two) times daily as needed (rash)., Disp: , Rfl:    warfarin (COUMADIN) 4 MG tablet, Take 4 mg by mouth 2 (two) times daily., Disp: , Rfl:  Allergies  Allergen Reactions   Dilaudid [Hydromorphone Hcl] Other (See Comments)    Makes pt hyper    Hydromorphone Other (See Comments)    hyperactiviity Other reaction(s): made him wild Other reaction(s): Unknown   Morphine Sulfate     Other reaction(s): at high dose causes confusion Other reaction(s): at high dose causes confusion   Tegretol [Carbamazepine] Hives   Carbamazepine Hives and Rash    Other reaction(s): hives Other  reaction(s): Hives Other reaction(s): hives     Social History   Socioeconomic History   Marital status: Married    Spouse name: Not on file   Number of children: Not on file   Years of education: Not on file   Highest education level: Not on file  Occupational History   Not on file  Tobacco Use   Smoking status: Former    Packs/day: 3.00    Years: 48.00    Total pack years: 144.00    Types: Cigarettes    Quit date: 2000    Years since quitting: 23.5   Smokeless tobacco: Never  Vaping Use   Vaping Use: Never used  Substance and Sexual Activity   Alcohol use: Never   Drug use: Never   Sexual activity: Never  Other Topics Concern   Not on file  Social History Narrative   ** Merged History Encounter **  Social Determinants of Health   Financial Resource Strain: Medium Risk (09/26/2018)   Overall Financial Resource Strain (CARDIA)    Difficulty of Paying Living Expenses: Somewhat hard  Food Insecurity: No Food Insecurity (06/03/2021)   Hunger Vital Sign    Worried About Running Out of Food in the Last Year: Never true    Ran Out of Food in the Last Year: Never true  Transportation Needs: No Transportation Needs (04/26/2021)   PRAPARE - Hydrologist (Medical): No    Lack of Transportation (Non-Medical): No  Physical Activity: Inactive (12/15/2019)   Exercise Vital Sign    Days of Exercise per Week: 0 days    Minutes of Exercise per Session: 0 min  Stress: Stress Concern Present (09/26/2018)   [place]    Feeling of Stress : To some extent  Social Connections: Not on file  Intimate Partner Violence: Not on file    Physical Exam      Future Appointments  Date Time Provider [place]  09/27/2021 11:20 AM Baldwin [place], PA-C [facility]  10/24/2021 11:00 AM Bensimhon, Shaune Pascal, MD [facility] None  11/14/2021  3:00 PM [name], MD  [facility]     ACTION: Home visit completed

## 2021-09-28 ENCOUNTER — Telehealth (HOSPITAL_COMMUNITY): Payer: Self-pay

## 2021-09-28 LAB — BASIC METABOLIC PANEL
BUN/Creatinine Ratio: 24 (ref 10–24)
BUN: 29 mg/dL — ABNORMAL HIGH (ref 8–27)
CO2: 31 mmol/L — ABNORMAL HIGH (ref 20–29)
Calcium: 9.2 mg/dL (ref 8.6–10.2)
Chloride: 97 mmol/L (ref 96–106)
Creatinine, Ser: 1.21 mg/dL (ref 0.76–1.27)
Glucose: 101 mg/dL — ABNORMAL HIGH (ref 70–99)
Potassium: 4 mmol/L (ref 3.5–5.2)
Sodium: 142 mmol/L (ref 134–144)
eGFR: 61 mL/min/{1.73_m2} (ref >59)

## 2021-09-28 LAB — CBC WITH DIFFERENTIAL/PLATELET
Basophils Absolute: 0 10*3/uL (ref 0.0–0.2)
Basos: 0 %
EOS (ABSOLUTE): 0.1 10*3/uL (ref 0.0–0.4)
Eos: 1 %
Hematocrit: 44.2 % (ref 37.5–51.0)
Hemoglobin: 14.5 g/dL (ref 13.0–17.7)
Immature Grans (Abs): 0 10*3/uL (ref 0.0–0.1)
Immature Granulocytes: 0 %
Lymphocytes Absolute: 3.3 10*3/uL — ABNORMAL HIGH (ref 0.7–3.1)
Lymphs: 30 %
MCH: 30.2 pg (ref 26.6–33.0)
MCHC: 32.8 g/dL (ref 31.5–35.7)
MCV: 92 fL (ref 79–97)
Monocytes Absolute: 0.7 10*3/uL (ref 0.1–0.9)
Monocytes: 6 %
Neutrophils Absolute: 6.8 10*3/uL (ref 1.4–7.0)
Neutrophils: 63 %
Platelets: 305 10*3/uL (ref 150–450)
RBC: 4.8 x10E6/uL (ref 4.14–5.80)
RDW: 14 % (ref 11.6–15.4)
WBC: 11 10*3/uL — ABNORMAL HIGH (ref 3.4–10.8)

## 2021-09-28 LAB — DIGOXIN LEVEL: Digoxin, Serum: <0.4 ng/mL — ABNORMAL LOW (ref 0.5–0.9)

## 2021-09-28 NOTE — Telephone Encounter (Signed)
Reviewed labs for Mr. Sar noted his INR levels being elevated and saw note from provider to hold Warfarin today.    Warfarin is managed by PCP- [name] currently he is taking a 69m BID per her order on 7/18.   I will ensure his warfarin is removed from pill box and will follow up as needed.   HSalena Saner EPark Hills7/26/2023

## 2021-09-29 ENCOUNTER — Other Ambulatory Visit (HOSPITAL_COMMUNITY): Payer: Self-pay | Admitting: *Deleted

## 2021-09-29 ENCOUNTER — Telehealth (HOSPITAL_COMMUNITY): Payer: Self-pay

## 2021-09-29 DIAGNOSIS — I5042 Chronic combined systolic (congestive) and diastolic (congestive) heart failure: Secondary | ICD-10-CM | POA: Diagnosis not present

## 2021-09-29 DIAGNOSIS — I4892 Unspecified atrial flutter: Secondary | ICD-10-CM | POA: Diagnosis not present

## 2021-09-29 DIAGNOSIS — I4891 Unspecified atrial fibrillation: Secondary | ICD-10-CM | POA: Diagnosis not present

## 2021-09-29 DIAGNOSIS — I110 Hypertensive heart disease with heart failure: Secondary | ICD-10-CM | POA: Diagnosis not present

## 2021-09-29 DIAGNOSIS — J449 Chronic obstructive pulmonary disease, unspecified: Secondary | ICD-10-CM | POA: Diagnosis not present

## 2021-09-29 NOTE — Telephone Encounter (Signed)
Called and spoke to Mr. Dery PCP office- Dr. Marlana Latus RN Danae Chen) who was made aware of INR being 4.2 on Tuesday 7/25. Danae Chen reports that his INR was 4.4 on 7/21 and he was instructed to hold his warfarin on that night and to restart on Saturday at 58m daily. This did not happen and I reported the most recent INR and that he has been taking 879mdaily.   ErDanae Chenpoke to Dr. HaMannie Stabilend they want him to hold his warfarin tonight and restart at 67m38momorrow and he [facility]. (AlElmo [phone]. HagMannie Stabilequested an updated med list with updated warfarin dose at 67mg667mxed to her office so they can verify his other medications.   I gave them my phone number and the HF clinic phone number and advised them to reach out if any med changes or information needed to be relayed to his heart care providers. EricDanae Chen is aware and appreciated the call.   I will ensure his warfarin is held tonight and he restarts new dose by communicating with his care giver at home and I will follow up with a home visit on Tuesday.   Call complete.   Call routed to HF Triage, CHMGPaisano ParkHeatSalena SanerT-[phone]/2023

## 2021-10-04 ENCOUNTER — Other Ambulatory Visit (HOSPITAL_COMMUNITY): Payer: Self-pay | Admitting: *Deleted

## 2021-10-04 ENCOUNTER — Telehealth (HOSPITAL_COMMUNITY): Payer: Self-pay

## 2021-10-04 ENCOUNTER — Other Ambulatory Visit (HOSPITAL_COMMUNITY): Payer: Self-pay

## 2021-10-04 ENCOUNTER — Other Ambulatory Visit (HOSPITAL_COMMUNITY): Payer: Self-pay | Admitting: Family Medicine

## 2021-10-04 MED ORDER — METOPROLOL TARTRATE 100 MG PO TABS: 100.0000 mg | ORAL_TABLET | Freq: Two times a day (BID) | ORAL | 0 refills | Status: DC

## 2021-10-04 NOTE — Telephone Encounter (Signed)
Warfarin Dose 56m per Dr. HMannie Stabileneeding to be added to medication list- I am unable to add new medications to patient lists, will route to triage for them to add same.   HSalena Saner ERose Lodge8/03/2021

## 2021-10-04 NOTE — Progress Notes (Signed)
Paramedicine Encounter    Patient ID: Ruben Murray, male    DOB: 1946-01-07, 76 y.o.   MRN: [id number]  Arrived for home visit for Ruben Murray reporting to be feeling good today with no complaints of chest pain, dizziness, shortness of breath other than when he is over exerting himself. He has lost some weight and no swelling noted. Lungs clear on assessment. He has been compliant with his medications over the last week. I obtained vitals:  WT- 225lbs BP- 102/68 HR- 94 O2- 97% RR- 16   I reviewed meds and confirmed same filling pill box for one week.   [facility]ills [facility] -[facility]oprolol ([facility] cant have this delivered until next week, so [facility])   -Phenobarbital   OTC- Loratadine    Appointments reviewed and confirmed- wrote on calendar.   I attempted to reach out to [place] with [facility] about INR follow up- I left her a message.    Home visit complete. I will see Ruben Murray in one week.   [name], [place] 10/04/2021    Patient Care Team: [name], MD as PCP - General (Family Medicine) [name], MD as PCP - Cardiology (Cardiology) [name], MD as PCP - Electrophysiology (Cardiology) [name], MD (Family Medicine) [place] as Case Manager (Hospice and Palliative Medicine)  Patient Active Problem List   Diagnosis Date Noted   AKI (acute kidney injury) ([place]) 07/27/2021   Acute pulmonary embolism ([place]) 07/26/2021   Acute on chronic systolic CHF (congestive heart failure) ([place]) 05/17/2021   Hypercoagulable state ([place]) 05/10/2021   Left leg pain 04/24/2021   OSA on CPAP 04/24/2021   Mitral regurgitation 04/24/2021   Tricuspid regurgitation 04/24/2021   Dilated aortic root ([place]) 04/24/2021   Permanent atrial fibrillation ([place]) 04/19/2021   Chronic arthropathy 04/13/2020   Callus 04/13/2020   Onychomycosis 12/12/2019   Left leg  cellulitis 24/23/5361   Chronic systolic heart failure ([place]) 12/09/2019   Dilated cardiomyopathy ([place])    Atrial flutter ([place]) 11/03/2019   Atrial flutter with rapid ventricular response ([place]) 11/03/2019   Acquired thrombophilia ([place]) 01/15/2019   Atrial fibrillation with rapid ventricular response ([place]) 10/29/2017   Chronic combined systolic and diastolic heart failure ([place]) 10/29/2017   Prolonged QT interval 10/29/2017   Sensorineural hearing loss (SNHL) of both ears 01/31/2017   Dysphonia 10/03/2016   Laryngopharyngeal reflux (LPR) 10/03/2016   Nasal polyps 10/03/2016   Rhinitis medicamentosa 10/03/2016   Chronic laryngitis 10/03/2016   Hoarseness 08/31/2016   Moderate persistent asthma 08/02/2016   COPD (chronic obstructive pulmonary disease) ([place]) 08/02/2016   Morbid (severe) obesity due to excess calories ([place]) 08/02/2016   Community acquired pneumonia 03/06/2016   Essential hypertension 03/06/2016   Mixed hyperlipidemia 03/06/2016   Right thyroid nodule 03/06/2016   Seizures ([place]) 03/06/2016   Fever 03/12/2015   Recurrent erosion of cornea 06/23/2011    Current Outpatient Medications:    acetaminophen (TYLENOL) 500 MG tablet, Take 1,000 mg by mouth at bedtime as needed for mild pain or headache., Disp: , Rfl:    Carboxymethylcellulose Sod PF 0.5 % SOLN, Place 1 drop into both eyes 4 (four) times daily as needed (dry eyes)., Disp: , Rfl:    Cholecalciferol (VITAMIN D3) 25 MCG (1000 UT) CAPS, Take 1,000 Units by mouth daily., Disp: , Rfl:    digoxin (LANOXIN) 0.125 MG tablet, Take 1 tablet (0.125 mg total) by  mouth daily., Disp: 30 tablet, Rfl: 3   empagliflozin (JARDIANCE) 10 MG TABS tablet, Take 1 tablet (10 mg total) by mouth daily before breakfast., Disp: 90 tablet, Rfl: 3   escitalopram (LEXAPRO) 20 MG tablet, Take 20 mg by mouth daily. Takes at bedtime., Disp: , Rfl:    finasteride (PROSCAR) 5 MG tablet, Take 5 mg by mouth daily at 12 noon. , Disp: , Rfl:    fluticasone  (FLONASE) 50 MCG/ACT nasal spray, Place 1 spray into both nostrils 2 (two) times daily., Disp: , Rfl:    loratadine (CLARITIN) 10 MG tablet, Take 10 mg by mouth daily., Disp: , Rfl:    losartan (COZAAR) 25 MG tablet, Take 0.5 tablets (12.5 mg total) by mouth daily., Disp: 45 tablet, Rfl: 3   metoprolol tartrate (LOPRESSOR) 100 MG tablet, Take 1 tablet (100 mg total) by mouth 2 (two) times daily., Disp: 180 tablet, Rfl: 2   Multiple Vitamin (MULTIVITAMIN WITH MINERALS) TABS tablet, Take 1 tablet by mouth daily at 12 noon., Disp: , Rfl:    PHENobarbital (LUMINAL) 97.2 MG tablet, Take 97.2 mg by mouth daily. Takes between 3:00-5:00pm., Disp: , Rfl:    potassium chloride SA (KLOR-CON M) 20 MEQ tablet, Take 1 tablet (20 mEq total) by mouth daily., Disp: 90 tablet, Rfl: 3   pravastatin (PRAVACHOL) 40 MG tablet, Take 40 mg by mouth at bedtime. , Disp: , Rfl:    spironolactone (ALDACTONE) 25 MG tablet, Take 0.5 tablets (12.5 mg total) by mouth daily., Disp: 45 tablet, Rfl: 2   torsemide 60 MG TABS, Take 60 mg by mouth daily., Disp: 30 tablet, Rfl: 1   triamcinolone cream (KENALOG) 0.1 %, Apply 1 application  topically 2 (two) times daily as needed (rash)., Disp: , Rfl:  Allergies  Allergen Reactions   Dilaudid [Hydromorphone Hcl] Other (See Comments)    Makes pt hyper    Hydromorphone Other (See Comments)    hyperactiviity Other reaction(s): made him wild Other reaction(s): Unknown   Morphine Sulfate     Other reaction(s): at high dose causes confusion Other reaction(s): at high dose causes confusion Other reaction(s): at high dose causes confusion, Other (See Comments) Other reaction(s): at high dose causes confusion Other reaction(s): at high dose causes confusion    Tegretol [Carbamazepine] Hives   Carbamazepine Hives, Rash and Other (See Comments)    Other reaction(s): hives Other reaction(s): Hives Other reaction(s): hives     Social History   Socioeconomic History   Marital status:  Married    Spouse name: Not on file   Number of children: Not on file   Years of education: Not on file   Highest education level: Not on file  Occupational History   Not on file  Tobacco Use   Smoking status: Former    Packs/day: 3.00    Years: 48.00    Total pack years: 144.00    Types: Cigarettes    Quit date: 2000    Years since quitting: 23.5   Smokeless tobacco: Never  Vaping Use   Vaping Use: Never used  Substance and Sexual Activity   Alcohol use: Never   Drug use: Never   Sexual activity: Never  Other Topics Concern   Not on file  Social History Narrative   ** Merged History Encounter **       Social Determinants of Health   Financial Resource Strain: Medium Risk (09/26/2018)   Overall Financial Resource Strain (CARDIA)    Difficulty of Paying Living Expenses:  Somewhat hard  Food Insecurity: No Food Insecurity (06/03/2021)   Hunger Vital Sign    Worried About Running Out of Food in the Last Year: Never true    Ran Out of Food in the Last Year: Never true  Transportation Needs: No Transportation Needs (04/26/2021)   PRAPARE - Hydrologist (Medical): No    Lack of Transportation (Non-Medical): No  Physical Activity: Inactive (12/15/2019)   Exercise Vital Sign    Days of Exercise per Week: 0 days    Minutes of Exercise per Session: 0 min  Stress: Stress Concern Present (09/26/2018)   [place]    Feeling of Stress : To some extent  Social Connections: Not on file  Intimate Partner Violence: Not on file    Physical Exam      Future Appointments  Date Time Provider [place]  10/24/2021 11:00 AM Bensimhon, Shaune Pascal, MD [facility] None  11/14/2021  3:00 PM [name], MD [facility]     ACTION: Home visit completed

## 2021-10-06 ENCOUNTER — Telehealth: Payer: Self-pay | Admitting: *Deleted

## 2021-10-06 NOTE — Telephone Encounter (Signed)
Reviewed procedure instructions w/ pt, scheduled 8/18. Aware NPO after MN night before procedure, arrive to [facility] at 1:30 day of procedure, no medications morning  of procedure. Patient verbalized understanding and agreeable to plan.

## 2021-10-12 ENCOUNTER — Other Ambulatory Visit (HOSPITAL_COMMUNITY): Payer: Self-pay

## 2021-10-12 NOTE — Progress Notes (Signed)
Paramedicine Encounter    Patient ID: Ruben Murray, male    DOB: [birth year], 76 y.o.   MRN: [id number]  Arrived for home visit for Ruben Murray who was seated being evaluated by his home RN'S and home PT.   Home RN- [place] RN- Ross Ludwig W/ [place] w/ [facility]   Ruben Murray reports to be feeling okay today with no complaints of chest pain, dizziness, shortness of breath more than normal or increased swelling. I obtained vitals as noted.   WT- 230lbs (UP 5 LBS FROM LAST WEEK)  BP- 136/82 HR-96 IRREG. O2- 98% RR- 18   Left lower leg noted to have some mild pitting edema. He denied increased work of breathing while seated but normal shortness of breath while walking or completing tasks. He denied any chest pain or heaviness or coughing. No missed doses of meds noted.    Ruben Murray has home health with [facility] coming out monthly for chronic care management. I sat with Ruben Murray and reviewed all meds and confirmed updated doses including his most recent Warfarin dose.   Ruben Murray reports according to Dr. Welton Flakes his PCP she wants him to hold his warfarin tonight and begin taking 19m every night going forward and INR will be rechecked again next week [facility] any [facility] changes per [facility]CP.   [facility] and they advise they were ordered to come out per PCP.   I updated note on his med list.   I reviewed all meds and filled pill box for one week for Ruben Murray  [facility]    I reviewed upcoming visits and appointments with [name] wrote down same on his calendar and shared these with his home health nurses.   I plan to see [name] one week on Wednesday morning. He agreed and knows to reach out if he continues to gain weight and becomes symptomatic.  Home visit complete.    Ruben Murray   Patient Care  Team: HCaren Macadam MD as PCP - General (Family Medicine) SJerline Pain MD as PCP - Cardiology (Cardiology) CConstance Haw MD as PCP - Electrophysiology (Cardiology) HCaren Macadam MD (Family Medicine) [facility] Case Manager (Hospice and Palliative Medicine)  Patient Active Problem List   Diagnosis Date Noted   AKI (acute kidney injury) ([place] 07/27/2021   Acute pulmonary embolism ([place] 07/26/2021   Acute on chronic systolic CHF (congestive heart failure) ([place] 05/17/2021   Hypercoagulable state ([place] 05/10/2021   Left leg pain 04/24/2021   OSA on CPAP 04/24/2021   Mitral regurgitation 04/24/2021   Tricuspid regurgitation 04/24/2021   Dilated aortic root ([place] 04/24/2021   Permanent atrial fibrillation ([place] 04/19/2021   Chronic arthropathy 04/13/2020   Callus 04/13/2020   Onychomycosis 12/12/2019   Left leg cellulitis 144/69/5072  Chronic systolic heart failure ([place] 12/09/2019   Dilated cardiomyopathy ([place]    Atrial flutter ([place] 11/03/2019   Atrial flutter with rapid ventricular response ([place] 11/03/2019   Acquired thrombophilia ([place] 01/15/2019   Atrial fibrillation with rapid ventricular response ([place] 10/29/2017   Chronic combined systolic and diastolic heart failure ([place] 10/29/2017   Prolonged QT interval 10/29/2017   Sensorineural hearing loss (SNHL) of both ears 01/31/2017   Dysphonia 10/03/2016   Laryngopharyngeal reflux (LPR) 10/03/2016   Nasal polyps 10/03/2016   Rhinitis medicamentosa 10/03/2016  Chronic laryngitis 10/03/2016   Hoarseness 08/31/2016   Moderate persistent asthma 08/02/2016   COPD (chronic obstructive pulmonary disease) ([place]) 08/02/2016   Morbid (severe) obesity due to excess calories ([place]) 08/02/2016   Community acquired pneumonia 03/06/2016   Essential hypertension 03/06/2016   Mixed hyperlipidemia 03/06/2016   Right thyroid nodule 03/06/2016   Seizures ([place]) 03/06/2016   Fever 03/12/2015   Recurrent erosion  of cornea 06/23/2011    Current Outpatient Medications:    acetaminophen (TYLENOL) 500 MG tablet, Take 1,000 mg by mouth at bedtime as needed for mild pain or headache., Disp: , Rfl:    Carboxymethylcellulose Sod PF 0.5 % SOLN, Place 1 drop into both eyes 4 (four) times daily as needed (dry eyes)., Disp: , Rfl:    Cholecalciferol (VITAMIN D3) 25 MCG (1000 UT) CAPS, Take 1,000 Units by mouth daily., Disp: , Rfl:    digoxin (LANOXIN) 0.125 MG tablet, Take 1 tablet (0.125 mg total) by mouth daily., Disp: 30 tablet, Rfl: 3   empagliflozin (JARDIANCE) 10 MG TABS tablet, Take 1 tablet (10 mg total) by mouth daily before breakfast., Disp: 90 tablet, Rfl: 3   escitalopram (LEXAPRO) 20 MG tablet, Take 20 mg by mouth daily. Takes at bedtime., Disp: , Rfl:    finasteride (PROSCAR) 5 MG tablet, Take 5 mg by mouth daily at 12 noon. , Disp: , Rfl:    fluticasone (FLONASE) 50 MCG/ACT nasal spray, Place 1 spray into both nostrils 2 (two) times daily., Disp: , Rfl:    loratadine (CLARITIN) 10 MG tablet, Take 10 mg by mouth daily., Disp: , Rfl:    losartan (COZAAR) 25 MG tablet, Take 0.5 tablets (12.5 mg total) by mouth daily., Disp: 45 tablet, Rfl: 3   metoprolol tartrate (LOPRESSOR) 100 MG tablet, Take 1 tablet (100 mg total) by mouth 2 (two) times daily., Disp: 60 tablet, Rfl: 0   Multiple Vitamin (MULTIVITAMIN WITH MINERALS) TABS tablet, Take 1 tablet by mouth daily at 12 noon., Disp: , Rfl:    PHENobarbital (LUMINAL) 97.2 MG tablet, Take 97.2 mg by mouth daily. Takes between 3:00-5:00pm., Disp: , Rfl:    potassium chloride SA (KLOR-CON M) 20 MEQ tablet, Take 1 tablet (20 mEq total) by mouth daily., Disp: 90 tablet, Rfl: 3   pravastatin (PRAVACHOL) 40 MG tablet, Take 40 mg by mouth at bedtime. , Disp: , Rfl:    spironolactone (ALDACTONE) 25 MG tablet, Take 0.5 tablets (12.5 mg total) by mouth daily., Disp: 45 tablet, Rfl: 2   torsemide 60 MG TABS, Take 60 mg by mouth daily., Disp: 30 tablet, Rfl: 1    triamcinolone cream (KENALOG) 0.1 %, Apply 1 application  topically 2 (two) times daily as needed (rash)., Disp: , Rfl:    warfarin (COUMADIN) 1 MG tablet, Take 7 tablets (7 mg total) by mouth daily., Disp: , Rfl:  Allergies  Allergen Reactions   Dilaudid [Hydromorphone Hcl] Other (See Comments)    Makes pt hyper    Hydromorphone Other (See Comments)    hyperactiviity Other reaction(s): made him wild Other reaction(s): Unknown   Morphine Sulfate     Other reaction(s): at high dose causes confusion Other reaction(s): at high dose causes confusion Other reaction(s): at high dose causes confusion, Other (See Comments) Other reaction(s): at high dose causes confusion Other reaction(s): at high dose causes confusion    Tegretol [Carbamazepine] Hives   Carbamazepine Hives, Rash and Other (See Comments)    Other reaction(s): hives Other reaction(s): Hives Other reaction(s): hives  Social History   Socioeconomic History   Marital status: Married    Spouse name: Not on file   Number of children: Not on file   Years of education: Not on file   Highest education level: Not on file  Occupational History   Not on file  Tobacco Use   Smoking status: Former    Packs/day: 3.00    Years: 48.00    Total pack years: 144.00    Types: Cigarettes    Quit date: 2000    Years since quitting: 23.6   Smokeless tobacco: Never  Vaping Use   Vaping Use: Never used  Substance and Sexual Activity   Alcohol use: Never   Drug use: Never   Sexual activity: Never  Other Topics Concern   Not on file  Social History Narrative   ** Merged History Encounter **       Social Determinants of Health   Financial Resource Strain: Medium Risk (09/26/2018)   Overall Financial Resource Strain (CARDIA)    Difficulty of Paying Living Expenses: Somewhat hard  Food Insecurity: No Food Insecurity (06/03/2021)   Hunger Vital Sign    Worried About Running Out of Food in the Last Year: Never true    Ran Out  of Food in the Last Year: Never true  Transportation Needs: No Transportation Needs (04/26/2021)   PRAPARE - Hydrologist (Medical): No    Lack of Transportation (Non-Medical): No  Physical Activity: Inactive (12/15/2019)   Exercise Vital Sign    Days of Exercise per Week: 0 days    Minutes of Exercise per Session: 0 min  Stress: Stress Concern Present (09/26/2018)   [place]    Feeling of Stress : To some extent  Social Connections: Not on file  Intimate Partner Violence: Not on file    Physical Exam      Future Appointments  Date Time Provider [place]  10/24/2021 11:00 AM Bensimhon, Shaune Pascal, MD [facility] None  11/02/2021 10:00 AM [facility]  11/14/2021  3:00 PM [name], MD [facility]  01/30/2022  2:30 PM Camnitz, Ocie Doyne, MD [facility]     ACTION: Home visit completed

## 2021-10-19 ENCOUNTER — Other Ambulatory Visit (HOSPITAL_COMMUNITY): Payer: Self-pay

## 2021-10-19 NOTE — Progress Notes (Signed)
Paramedicine Encounter    Patient ID: Ruben Murray, male    DOB: 11-Oct-1945, 76 y.o.   MRN: [id number]  Arrived for home visit for Ruben Murray who reports feeling good today with no complaints. He denied chest pain, dizziness, shortness of breath, swelling or weight gain. He has been med compliant over the last week. He has ICD implant planned for Friday at 1130 at [facility].   I obtained vitals: WT- 230lbs BP- 118/86 HR- 80 RR-16 O2-96%  No edema noted, lungs clear.   I reviewed meds and confirmed same. Pill box filled for one week.   Refills: Digoxin   Ruben Murray is questioning if he should be using CPAP- he says he used to have one but was "taken off of it". I will look into this.   I spoke to Ruben Murray with [facility] who reports his INR is 4.1 this week and to remain at 42m of warfarin daily per Dr. HWelton Flakes   We reviewed upcoming visits and procedure plans and confirmed we will meet in HF clinic on Monday.  He knows to reach out to me if needed. Visit complete.   HSalena Saner EVirgilina8/16/2023   Patient Care Team: HCaren Macadam MD as PCP - General (Family Medicine) SJerline Pain MD as PCP - Cardiology (Cardiology) CConstance Haw MD as PCP - Electrophysiology (Cardiology) HCaren Macadam MD (Family Medicine) [facility] Case [facility] (Hospice and Palliative Medicine)  Patient Active Problem List   Diagnosis Date Noted   AKI (acute kidney injury) ([place] 07/27/2021   Acute pulmonary embolism ([facility] 07/26/2021   Acute on chronic systolic CHF (congestive heart failure) ([place] 05/17/2021   Hypercoagulable state (HS[place] 05/10/2021   Left leg pain 04/24/2021   OSA on CPAP 04/24/2021   Mitral regurgitation 04/24/2021   Tricuspid regurgitation 04/24/2021   Dilated aortic root (HS[place] 04/24/2021   Permanent atrial fibrillation (H[place] 04/19/2021   Chronic arthropathy 04/13/2020   Callus 04/13/2020   Onychomycosis 12/12/2019   Left leg  cellulitis 199/37/1696  Chronic systolic heart failure (HR[place] 12/09/2019   Dilated cardiomyopathy ([place]    Atrial flutter (HL[place] 11/03/2019   Atrial flutter with rapid ventricular response (HE[place] 11/03/2019   Acquired thrombophilia (H[place] 01/15/2019   Atrial fibrillation with rapid ventricular response ([place] 10/29/2017   Chronic combined systolic and diastolic heart failure ([place] 10/29/2017   Prolonged QT interval 10/29/2017   Sensorineural hearing loss (SNHL) of both ears 01/31/2017   Dysphonia 10/03/2016   Laryngopharyngeal reflux (LPR) 10/03/2016   Nasal polyps 10/03/2016   Rhinitis medicamentosa 10/03/2016   Chronic laryngitis 10/03/2016   Hoarseness 08/31/2016   Moderate persistent asthma 08/02/2016   COPD (chronic obstructive pulmonary disease) (H[place] 08/02/2016   Morbid (severe) obesity due to excess calories (HV[place] 08/02/2016   Community acquired pneumonia 03/06/2016   Essential hypertension 03/06/2016   Mixed hyperlipidemia 03/06/2016   Right thyroid nodule 03/06/2016   Seizures (H[place] 03/06/2016   Fever 03/12/2015   Recurrent erosion of cornea 06/23/2011    Current Outpatient Medications:    acetaminophen (TYLENOL) 500 MG tablet, Take 1,000 mg by mouth at bedtime as needed for mild pain or headache., Disp: , Rfl:    Carboxymethylcellulose Sod PF 0.5 % SOLN, Place 1 drop into both eyes 4 (four) times daily as needed (dry eyes)., Disp: , Rfl:    Cholecalciferol (VITAMIN D3) 25 MCG (1000 UT) CAPS, Take 1,000 Units by mouth daily., Disp: , Rfl:    digoxin (LANOXIN) 0.125  MG tablet, Take 1 tablet (0.125 mg total) by mouth daily., Disp: 30 tablet, Rfl: 3   empagliflozin (JARDIANCE) 10 MG TABS tablet, Take 1 tablet (10 mg total) by mouth daily before breakfast., Disp: 90 tablet, Rfl: 3   escitalopram (LEXAPRO) 20 MG tablet, Take 20 mg by mouth daily. Takes at bedtime., Disp: , Rfl:    finasteride (PROSCAR) 5 MG tablet, Take 5 mg by mouth daily at 12 noon. , Disp: , Rfl:    fluticasone  (FLONASE) 50 MCG/ACT nasal spray, Place 1 spray into both nostrils 2 (two) times daily., Disp: , Rfl:    loratadine (CLARITIN) 10 MG tablet, Take 10 mg by mouth daily., Disp: , Rfl:    losartan (COZAAR) 25 MG tablet, Take 0.5 tablets (12.5 mg total) by mouth daily., Disp: 45 tablet, Rfl: 3   metoprolol tartrate (LOPRESSOR) 100 MG tablet, Take 1 tablet (100 mg total) by mouth 2 (two) times daily., Disp: 60 tablet, Rfl: 0   Multiple Vitamin (MULTIVITAMIN WITH MINERALS) TABS tablet, Take 1 tablet by mouth daily at 12 noon., Disp: , Rfl:    PHENobarbital (LUMINAL) 97.2 MG tablet, Take 97.2 mg by mouth daily. Takes between 3:00-5:00pm., Disp: , Rfl:    potassium chloride SA (KLOR-CON M) 20 MEQ tablet, Take 1 tablet (20 mEq total) by mouth daily., Disp: 90 tablet, Rfl: 3   pravastatin (PRAVACHOL) 40 MG tablet, Take 40 mg by mouth at bedtime. , Disp: , Rfl:    spironolactone (ALDACTONE) 25 MG tablet, Take 0.5 tablets (12.5 mg total) by mouth daily., Disp: 45 tablet, Rfl: 2   torsemide 60 MG TABS, Take 60 mg by mouth daily., Disp: 30 tablet, Rfl: 1   triamcinolone cream (KENALOG) 0.1 %, Apply 1 application  topically 2 (two) times daily as needed (rash)., Disp: , Rfl:    warfarin (COUMADIN) 1 MG tablet, Take 6 mg by mouth daily., Disp: , Rfl:  Allergies  Allergen Reactions   Dilaudid [Hydromorphone Hcl] Other (See Comments)    Makes pt hyper    Hydromorphone Other (See Comments)    hyperactiviity Other reaction(s): made him wild Other reaction(s): Unknown   Morphine Sulfate     Other reaction(s): at high dose causes confusion Other reaction(s): at high dose causes confusion Other reaction(s): at high dose causes confusion, Other (See Comments) Other reaction(s): at high dose causes confusion Other reaction(s): at high dose causes confusion    Tegretol [Carbamazepine] Hives   Carbamazepine Hives, Rash and Other (See Comments)    Other reaction(s): hives Other reaction(s): Hives Other  reaction(s): hives     Social History   Socioeconomic History   Marital status: Married    Spouse name: Not on file   Number of children: Not on file   Years of education: Not on file   Highest education level: Not on file  Occupational History   Not on file  Tobacco Use   Smoking status: Former    Packs/day: 3.00    Years: 48.00    Total pack years: 144.00    Types: Cigarettes    Quit date: 2000    Years since quitting: 23.6   Smokeless tobacco: Never  Vaping Use   Vaping Use: Never used  Substance and Sexual Activity   Alcohol use: Never   Drug use: Never   Sexual activity: Never  Other Topics Concern   Not on file  Social History Narrative   ** Merged History Encounter **       Social  Determinants of Health   Financial Resource Strain: Medium Risk (09/26/2018)   Overall Financial Resource Strain (CARDIA)    Difficulty of Paying Living Expenses: Somewhat hard  Food Insecurity: No Food Insecurity (06/03/2021)   Hunger Vital Sign    Worried About Running Out of Food in the Last Year: Never true    Ran Out of Food in the Last Year: Never true  Transportation Needs: No Transportation Needs (04/26/2021)   PRAPARE - Hydrologist (Medical): No    Lack of Transportation (Non-Medical): No  Physical Activity: Inactive (12/15/2019)   Exercise Vital Sign    Days of Exercise per Week: 0 days    Minutes of Exercise per Session: 0 min  Stress: Stress Concern Present (09/26/2018)   [place]    Feeling of Stress : To some extent  Social Connections: Not on file  Intimate Partner Violence: Not on file    Physical Exam      Future Appointments  Date Time Provider [place]  10/24/2021 11:00 AM Bensimhon, Shaune Pascal, MD [facility] None  11/02/2021 10:00 AM [facility] 1 [facility]  11/14/2021  3:00 PM [name], MD [facility]   01/30/2022  2:30 PM Camnitz, Ocie Doyne, MD [facility]     ACTION: Home visit completed

## 2021-10-21 ENCOUNTER — Ambulatory Visit (HOSPITAL_COMMUNITY)
Admission: RE | Admit: 2021-10-21 | Discharge: 2021-10-21 | Disposition: A | Discharge status: Home / Self Care | Payer: Medicare PPO | Source: Ambulatory Visit | Attending: Cardiology | Admitting: Cardiology

## 2021-10-21 ENCOUNTER — Encounter (HOSPITAL_COMMUNITY)
Admission: RE | Disposition: A | Discharge status: Home / Self Care | Payer: Self-pay | Source: Ambulatory Visit | Attending: Cardiology

## 2021-10-21 ENCOUNTER — Other Ambulatory Visit: Payer: Self-pay

## 2021-10-21 DIAGNOSIS — Z539 Procedure and treatment not carried out, unspecified reason: Secondary | ICD-10-CM | POA: Insufficient documentation

## 2021-10-21 DIAGNOSIS — I5022 Chronic systolic (congestive) heart failure: Secondary | ICD-10-CM | POA: Insufficient documentation

## 2021-10-21 DIAGNOSIS — G4733 Obstructive sleep apnea (adult) (pediatric): Secondary | ICD-10-CM | POA: Diagnosis not present

## 2021-10-21 DIAGNOSIS — I4821 Permanent atrial fibrillation: Secondary | ICD-10-CM | POA: Insufficient documentation

## 2021-10-21 DIAGNOSIS — R000 Tachycardia, unspecified: Secondary | ICD-10-CM | POA: Diagnosis not present

## 2021-10-21 DIAGNOSIS — I110 Hypertensive heart disease with heart failure: Secondary | ICD-10-CM | POA: Insufficient documentation

## 2021-10-21 LAB — PROTIME-INR
INR: 4.3 (ref 0.8–1.2)
Prothrombin Time: 41 seconds — ABNORMAL HIGH (ref 11.4–15.2)

## 2021-10-21 SURGERY — BIV ICD INSERTION CRT-D
Anesthesia: LOCAL

## 2021-10-21 MED ORDER — SODIUM CHLORIDE 0.9 % IV SOLN: INTRAVENOUS | Status: DC

## 2021-10-21 MED ORDER — CEFAZOLIN SODIUM-DEXTROSE 2-4 GM/100ML-% IV SOLN: 2.0000 g | INTRAVENOUS | Status: DC

## 2021-10-21 MED ORDER — SODIUM CHLORIDE 0.9 % IV SOLN: 80.0000 mg | INTRAVENOUS | Status: DC

## 2021-10-21 MED ORDER — CHLORHEXIDINE GLUCONATE 4 % EX LIQD
4.0000 | Freq: Once | CUTANEOUS | Status: AC
  Administered 2021-10-21: 4 via TOPICAL
  Filled 2021-10-21: qty 60

## 2021-10-21 NOTE — Interval H&P Note (Signed)
History and Physical Interval Note:  10/21/2021 12:13 PM  Ruben Murray  has presented today for surgery, with the diagnosis of heart failure.  The various methods of treatment have been discussed with the patient and family. After consideration of risks, benefits and other options for treatment, the patient has consented to  Procedure(s): BIV ICD INSERTION CRT-D (N/A) as a surgical intervention.  The patient's history has been reviewed, patient examined, no change in status, stable for surgery.  I have reviewed the patient's chart and labs.  Questions were answered to the patient's satisfaction.     Ruben Murray  ICD Criteria  Current LVEF:20%. Within 12 months prior to implant: Yes   Heart failure history: Yes, Class II  Cardiomyopathy history: Yes, Non-Ischemic Cardiomyopathy.  Atrial Fibrillation/Atrial Flutter: Yes, Permanent.  Ventricular tachycardia history: No.  Cardiac arrest history: No.  History of syndromes with risk of sudden death: No.  Previous ICD: No.  Current ICD indication: Primary  PPM indication: No.  Class I or II Bradycardia indication present: No  Beta Blocker therapy for 3 or more months: Yes, prescribed.   Ace Inhibitor/ARB therapy for 3 or more months: No, medical reason.   I have seen Ruben Murray is a 76 y.o. malepre-procedural and has been referred by [facility] for consideration of ICD implant for primary prevention of sudden death.  The patient's chart has been reviewed and they meet criteria for ICD implant.  I have had a thorough discussion with the patient reviewing options.  The patient and their family (if available) have had opportunities to ask questions and have them answered. The patient and I have decided together through the [place] [employer] to implant ICD at this time.  Risks, benefits, alternatives to ICD implantation were discussed in detail with the patient today. The patient  understands that the  risks include but are not limited to bleeding, infection, pneumothorax, perforation, tamponade, vascular damage, renal failure, MI, stroke, death, inappropriate shocks, and lead dislodgement and wishes to proceed.

## 2021-10-21 NOTE — Progress Notes (Signed)
CRITICAL RESULT PROVIDER NOTIFICATION  Test performed and critical result: INR 4.3  Date and time result received:  8/18 1325  Provider name/title: Curt Bears, MD (Veronica notified in cath lab)  Date and time provider notified: 8/18 1327  Date and time provider responded: 8/18 1327  Provider response: [name] will let the MD and cath lab know

## 2021-10-21 NOTE — Discharge Instructions (Signed)
Supplemental Discharge Instructions for  Pacemaker/Defibrillator Patients  Tomorrow, 10/22/21, send in a device transmission  Activity No heavy lifting or vigorous activity with your left/right arm for 6 to 8 weeks.  Do not raise your left/right arm above your head for one week.  Gradually raise your affected arm as drawn below.              10/26/21                    10/27/21                   10/28/21                  10/29/21            __  NO DRIVING until cleared to at your wound check visit.  WOUND CARE Keep the wound area clean and dry.  Do not get this area wet , no showers until cleared to at your wound check visit . Tomorrow, 10/22/21, remove the arm sling Tomorrow, 10/22/21 remove the outer LARGE plastic bandage.  Underneath the plastic bandage there are steri strips (paper tapes), DO NOT remove these. The tape/steri-strips on your wound will fall off; do not pull them off.  No bandage is needed on the site.  DO  NOT apply any creams, oils, or ointments to the wound area. If you notice any drainage or discharge from the wound, any swelling or bruising at the site, or you develop a fever > 101? F after you are discharged home, call the office at once.  Special Instructions You are still able to use cellular telephones; use the ear opposite the side where you have your pacemaker/defibrillator.  Avoid carrying your cellular phone near your device. When traveling through airports, show security personnel your identification card to avoid being screened in the metal detectors.  Ask the security personnel to use the hand wand. Avoid arc welding equipment, MRI testing (magnetic resonance imaging), TENS units (transcutaneous nerve stimulators).  Call the office for questions about other devices. Avoid electrical appliances that are in poor condition or are not properly grounded. Microwave ovens are safe to be near or to operate.  Additional information for defibrillator patients should  your device go off: If your device goes off ONCE and you feel fine afterward, notify the device clinic nurses. If your device goes off ONCE and you do not feel well afterward, call 911. If your device goes off TWICE, call 911. If your device goes off THREE times in one day, call 911.  DO NOT DRIVE YOURSELF OR A FAMILY MEMBER WITH A DEFIBRILLATOR TO THE HOSPITAL--CALL 911.

## 2021-10-23 DIAGNOSIS — G4733 Obstructive sleep apnea (adult) (pediatric): Secondary | ICD-10-CM | POA: Diagnosis not present

## 2021-10-23 DIAGNOSIS — I130 Hypertensive heart and chronic kidney disease with heart failure and stage 1 through stage 4 chronic kidney disease, or unspecified chronic kidney disease: Secondary | ICD-10-CM | POA: Diagnosis not present

## 2021-10-23 DIAGNOSIS — J449 Chronic obstructive pulmonary disease, unspecified: Secondary | ICD-10-CM | POA: Diagnosis not present

## 2021-10-23 DIAGNOSIS — E1122 Type 2 diabetes mellitus with diabetic chronic kidney disease: Secondary | ICD-10-CM | POA: Diagnosis not present

## 2021-10-23 DIAGNOSIS — D631 Anemia in chronic kidney disease: Secondary | ICD-10-CM | POA: Diagnosis not present

## 2021-10-23 DIAGNOSIS — N1832 Chronic kidney disease, stage 3b: Secondary | ICD-10-CM | POA: Diagnosis not present

## 2021-10-23 DIAGNOSIS — I480 Paroxysmal atrial fibrillation: Secondary | ICD-10-CM | POA: Diagnosis not present

## 2021-10-23 DIAGNOSIS — I5042 Chronic combined systolic (congestive) and diastolic (congestive) heart failure: Secondary | ICD-10-CM | POA: Diagnosis not present

## 2021-10-23 DIAGNOSIS — G40909 Epilepsy, unspecified, not intractable, without status epilepticus: Secondary | ICD-10-CM | POA: Diagnosis not present

## 2021-10-24 ENCOUNTER — Telehealth (HOSPITAL_COMMUNITY): Payer: Self-pay | Admitting: *Deleted

## 2021-10-24 ENCOUNTER — Encounter (HOSPITAL_COMMUNITY): Payer: Self-pay | Admitting: Internal Medicine

## 2021-10-24 ENCOUNTER — Ambulatory Visit (HOSPITAL_COMMUNITY)
Admission: RE | Admit: 2021-10-24 | Discharge: 2021-10-24 | Disposition: A | Discharge status: Home / Self Care | Payer: Medicare PPO | Source: Ambulatory Visit | Attending: Internal Medicine | Admitting: Internal Medicine

## 2021-10-24 ENCOUNTER — Other Ambulatory Visit (HOSPITAL_COMMUNITY): Payer: Self-pay

## 2021-10-24 VITALS — BP 102/60 | HR 68 | Wt 231.8 lb

## 2021-10-24 DIAGNOSIS — G4733 Obstructive sleep apnea (adult) (pediatric): Secondary | ICD-10-CM | POA: Insufficient documentation

## 2021-10-24 DIAGNOSIS — I081 Rheumatic disorders of both mitral and tricuspid valves: Secondary | ICD-10-CM | POA: Diagnosis not present

## 2021-10-24 DIAGNOSIS — G40909 Epilepsy, unspecified, not intractable, without status epilepticus: Secondary | ICD-10-CM | POA: Insufficient documentation

## 2021-10-24 DIAGNOSIS — I5022 Chronic systolic (congestive) heart failure: Secondary | ICD-10-CM | POA: Insufficient documentation

## 2021-10-24 DIAGNOSIS — Z91148 Patient's other noncompliance with medication regimen for other reason: Secondary | ICD-10-CM | POA: Diagnosis not present

## 2021-10-24 DIAGNOSIS — J449 Chronic obstructive pulmonary disease, unspecified: Secondary | ICD-10-CM | POA: Diagnosis not present

## 2021-10-24 DIAGNOSIS — Z79899 Other long term (current) drug therapy: Secondary | ICD-10-CM | POA: Diagnosis not present

## 2021-10-24 DIAGNOSIS — I2699 Other pulmonary embolism without acute cor pulmonale: Secondary | ICD-10-CM | POA: Diagnosis not present

## 2021-10-24 DIAGNOSIS — I272 Pulmonary hypertension, unspecified: Secondary | ICD-10-CM | POA: Diagnosis not present

## 2021-10-24 DIAGNOSIS — Z7984 Long term (current) use of oral hypoglycemic drugs: Secondary | ICD-10-CM | POA: Insufficient documentation

## 2021-10-24 DIAGNOSIS — I428 Other cardiomyopathies: Secondary | ICD-10-CM | POA: Insufficient documentation

## 2021-10-24 DIAGNOSIS — I4821 Permanent atrial fibrillation: Secondary | ICD-10-CM | POA: Diagnosis not present

## 2021-10-24 DIAGNOSIS — I110 Hypertensive heart disease with heart failure: Secondary | ICD-10-CM | POA: Diagnosis not present

## 2021-10-24 DIAGNOSIS — Z7901 Long term (current) use of anticoagulants: Secondary | ICD-10-CM | POA: Insufficient documentation

## 2021-10-24 LAB — COMPREHENSIVE METABOLIC PANEL
ALT: 26 U/L (ref 0–44)
AST: 34 U/L (ref 15–41)
Albumin: 3.8 g/dL (ref 3.5–5.0)
Alkaline Phosphatase: 105 U/L (ref 38–126)
Anion gap: 11 (ref 5–15)
BUN: 24 mg/dL — ABNORMAL HIGH (ref 8–23)
CO2: 29 mmol/L (ref 22–32)
Calcium: 8.9 mg/dL (ref 8.9–10.3)
Chloride: 98 mmol/L (ref 98–111)
Creatinine, Ser: 1.18 mg/dL (ref 0.61–1.24)
GFR, Estimated: >60 mL/min (ref >60)
Glucose, Bld: 88 mg/dL (ref 70–99)
Potassium: 4 mmol/L (ref 3.5–5.1)
Sodium: 138 mmol/L (ref 135–145)
Total Bilirubin: 0.7 mg/dL (ref 0.3–1.2)
Total Protein: 7.1 g/dL (ref 6.5–8.1)

## 2021-10-24 LAB — CBC
HCT: 46.2 % (ref 39.0–52.0)
Hemoglobin: 14.9 g/dL (ref 13.0–17.0)
MCH: 30.8 pg (ref 26.0–34.0)
MCHC: 32.3 g/dL (ref 30.0–36.0)
MCV: 95.7 fL (ref 80.0–100.0)
Platelets: 205 10*3/uL (ref 150–400)
RBC: 4.83 MIL/uL (ref 4.22–5.81)
RDW: 15.3 % (ref 11.5–15.5)
WBC: 7.2 10*3/uL (ref 4.0–10.5)
nRBC: 0 % (ref 0.0–0.2)

## 2021-10-24 LAB — PROTIME-INR
INR: 4.8 (ref 0.8–1.2)
Prothrombin Time: 44.4 s — ABNORMAL HIGH (ref 11.4–15.2)

## 2021-10-24 LAB — DIGOXIN LEVEL: Digoxin Level: 1 ng/mL (ref 0.8–2.0)

## 2021-10-24 LAB — BRAIN NATRIURETIC PEPTIDE: B Natriuretic Peptide: 222.7 pg/mL — ABNORMAL HIGH (ref 0.0–100.0)

## 2021-10-24 MED ORDER — METOPROLOL SUCCINATE ER 100 MG PO TB24: 100.0000 mg | ORAL_TABLET | Freq: Two times a day (BID) | ORAL | 3 refills | Status: DC

## 2021-10-24 NOTE — Progress Notes (Signed)
Advanced Heart Failure Clinic Note   Primary Care: [name], MD HF Cardiologist: Dr. Haroldine Laws  HPI: Ruben Murray is a 76 y.o.with HFrEF d/t NICM, permanent A fib, pulmonary HTN,  OSA, seizure d/o, COPD and HTN   Echo 2018 EF 40-45%  Lost to cardiology f/u 2022.    Admitted  04/2021 for a/c CHF in the setting of rapid afib. Echo EF 20-25%, RV mildly reduced, RVSP 48 mmHg, mild Ruben, mild TR. He was diuresed w/ IV Lasix. Afib treated w/ rate control, w/ metoprolol and digoxin. Amiodarone discontinued given chronic afib.  Wt charted at 227 lb.    He was seen by Dr. Curt Bears . Plan is for CRT-D implant w/ plans to consider AV node ablation if further issues w/ rapid afib.    Had initial TOC visit 05/04/2021. HF meds adjusted, but he did not start them.    Admitted 05/17/21 with AF RVR and A/C HFrEF.  Prior to admit he never picked up digoxin, Toprol, or Jardiance. Diuresed with IV lasix.  Discharged on lopressor 100 bid, digoxin 0.25 , and torsemide 40 mg bid. Discharge weight 214 pounds.   Seen in [facility] 3/23, volume trending up. Torsemide increased and spiro started. Referred to paramedicine.  Readmitted 5/23 with AFib with RVR, HR 150's, INR 3.8. CTA chest with small PE w/ R heart strain, also with volume overload. Echo with EF 20%. No DOAC due to hx of seizures and phenobarbital use.  AFib controlled with metoprolol 100 bid and digoxin (dig previously stopped due to elevated level). PMT consulted for [place], and he remained DNR. He was discharged home with HH, weight 228 lbs.  Scheduled for CRT-D implant 10/21/21 with Dr. Curt Bears but cancelled due to INR 4.3   Today he returns for HF follow up with paramedic, Ruben Murray. Stable NYHA III. SOB with minimal exertion. No change. No CP, edema, orthopnea or PND. Compliant with meds. AF rates 70-90s per Paramedicine.   Cardiac Testing  - Echo 2/18 EF 40-45%, RV normal, mod TR - Echo 8/19 EF 25%, RV moderately reduced (in Afib) - Echo 1/20 EF 40-45%,  RV mildly dilated, systolic fx normal  - Echo 8/21 EF 20-25%, RV moderately reduced, RVSP 38, Mild Ruben, mild TR - LHC 9/21 Normal coronaries, RHC mild pulmonary hypertension; PA 34/22, mean PA pressure 28 mmHg  - Echo 2/23 EF 20-25%, RV mildly reduced, RVSP 48, mild Ruben, mild TR  - Echo 5/23: EF 20%, RV moderately down, moderate to severe TR, mild Ruben (he was in AF and volume overloaded).  Past Medical History:  Diagnosis Date   Acquired thrombophilia ([place]) 01/15/2019   Aortic atherosclerosis ([place])    Aortic root dilatation (HCC)    CAP (community acquired pneumonia) 03/06/2016   Chronic laryngitis 01/65/5374   Chronic systolic CHF (congestive heart failure) ([place])    COPD GOLD II  08/02/2016   Quit smoking 2000  PFT's  07/10/2016  FEV1 1.98 (70 % ) ratio 67  p 19 % improvement from saba p nothing  prior to study while of coreg so rec as of 08/02/2016 try off coreg and on bisoprolol      Essential hypertension 03/06/2016   Changed from coreg to bisoprolol due to copd with reversible component  08/02/2016 >>>    Hoarseness 08/31/2016   Referred to ent 08/31/2016 >>> seen 10/03/16 [name] dx gerd and rhinitis medicamentosa   > improved on f/u 01/31/17 on gerd rx/ flonase and off afrin   Hyperlipidemia  Hypertension    Laryngopharyngeal reflux (LPR) 10/03/2016   Mitral regurgitation    Moderate persistent asthma 08/02/2016   Morbid (severe) obesity due to excess calories ([place]) 08/02/2016   Nasal polyps 10/03/2016   NICM (nonischemic cardiomyopathy) ([place])    a. EF 40-45% in 04/2016, etiology not defined, managed medically.   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     Carboxymethylcellulose Sod PF 0.5 % SOLN Place 1 drop into both eyes 4 (four) times daily as needed (dry eyes).     Cholecalciferol (VITAMIN D3) 25 MCG (1000 UT) CAPS Take 1,000 Units by mouth daily.     digoxin (LANOXIN) 0.125 MG tablet Take 1 tablet (0.125 mg total) by mouth daily. 30 tablet 3   empagliflozin (JARDIANCE) 10 MG TABS tablet Take 1 tablet (10 mg total) by mouth daily before breakfast. 90 tablet 3   escitalopram (LEXAPRO) 20 MG tablet Take 20 mg by mouth daily. Takes at bedtime.     finasteride (PROSCAR) 5 MG tablet Take 5 mg by mouth daily at 12 noon.      fluticasone (FLONASE) 50 MCG/ACT nasal spray Place 1 spray into both nostrils 2 (two) times daily.     loratadine (CLARITIN) 10 MG tablet Take 10 mg by mouth daily.     losartan (COZAAR) 25 MG tablet Take 0.5 tablets (12.5 mg total) by mouth daily. 45 tablet 3   metoprolol tartrate (LOPRESSOR) 100 MG tablet Take 1 tablet (100 mg total) by mouth 2 (two) times daily. 60 tablet 0   Multiple Vitamin (MULTIVITAMIN WITH MINERALS) TABS tablet Take 1 tablet by mouth daily at 12 noon.     PHENobarbital (LUMINAL) 97.2 MG tablet Take 97.2 mg by mouth daily. Takes between 3:00-5:00pm.     potassium chloride SA (KLOR-CON M) 20 MEQ tablet Take 1 tablet (20 mEq total) by mouth daily. 90 tablet 3   pravastatin (PRAVACHOL) 40 MG tablet Take 40 mg by mouth at bedtime.      spironolactone (ALDACTONE) 25 MG tablet Take 0.5 tablets (12.5 mg total) by mouth daily. 45 tablet 2   torsemide 60 MG TABS Take 60 mg by mouth daily. 30 tablet 1   triamcinolone cream (KENALOG) 0.1 % Apply 1 application  topically 2 (two) times daily as needed (rash).     warfarin (COUMADIN) 1 MG tablet Take 6 mg by mouth daily.     No current facility-administered medications for this encounter.   Allergies  Allergen Reactions    Dilaudid [Hydromorphone Hcl] Other (See Comments)    Makes pt hyper    Hydromorphone Other (See Comments)    hyperactiviity Other reaction(s): made him wild Other reaction(s): Unknown   Morphine Sulfate     Other reaction(s): at high dose causes confusion Other reaction(s): at high dose causes confusion Other reaction(s): at high dose causes confusion, Other (See Comments) Other reaction(s): at high dose causes confusion Other reaction(s): at high dose causes confusion    Tegretol [Carbamazepine] Hives  Carbamazepine Hives, Rash and Other (See Comments)    Other reaction(s): hives Other reaction(s): Hives Other reaction(s): hives   Social History   Socioeconomic History   Marital status: Married    Spouse name: Not on file   Number of children: Not on file   Years of education: Not on file   Highest education level: Not on file  Occupational History   Not on file  Tobacco Use   Smoking status: Former    Packs/day: 3.00    Years: 48.00    Total pack years: 144.00    Types: Cigarettes    Quit date: 2000    Years since quitting: 23.6   Smokeless tobacco: Never  Vaping Use   Vaping Use: Never used  Substance and Sexual Activity   Alcohol use: Never   Drug use: Never   Sexual activity: Never  Other Topics Concern   Not on file  Social History Narrative   ** Merged History Encounter **       Social Determinants of Health   Financial Resource Strain: Medium Risk (09/26/2018)   Overall Financial Resource Strain (CARDIA)    Difficulty of Paying Living Expenses: Somewhat hard  Food Insecurity: No Food Insecurity (06/03/2021)   Hunger Vital Sign    Worried About Running Out of Food in the Last Year: Never true    Ran Out of Food in the Last Year: Never true  Transportation Needs: No Transportation Needs (04/26/2021)   PRAPARE - Hydrologist (Medical): No    Lack of Transportation (Non-Medical): No  Physical Activity: Inactive  (12/15/2019)   Exercise Vital Sign    Days of Exercise per Week: 0 days    Minutes of Exercise per Session: 0 min  Stress: Stress Concern Present (09/26/2018)   [place]    Feeling of Stress : To some extent  Social Connections: Not on file  Intimate Partner Violence: Not on file   Family History  Problem Relation Age of Onset   Hypertension Mother    Other Father        sudden cardiac arrest   BP 102/60   Pulse 68   Wt 105.1 kg (231 lb 12.8 oz)   SpO2 94%   BMI 35.25 kg/m   Wt Readings from Last 3 Encounters:  10/24/21 105.1 kg (231 lb 12.8 oz)  10/21/21 103.4 kg (228 lb)  10/19/21 104.3 kg (230 lb)   PHYSICAL EXAM: General:  Well appearing. No resp difficulty HEENT: normal Neck: supple. no JVD. Carotids 2+ bilat; no bruits. No lymphadenopathy or thryomegaly appreciated. Cor: PMI nondisplaced. Regular rate & rhythm. No rubs, gallops or murmurs. Lungs: clear Abdomen: obese soft, nontender, nondistended. No hepatosplenomegaly. No bruits or masses. Good bowel sounds. Extremities: no cyanosis, clubbing, rash, edema Neuro: alert & orientedx3, cranial nerves grossly intact. moves all 4 extremities w/o difficulty. Affect pleasant   ECG: atrial fibrillation rBBB, 75 bpm (personally reviewed)  ASSESSMENT & PLAN: 1. Chronic Systolic Heart Failure - NICM. Dates back to at least 2018, EFs have fluctuated from 20-40% - most recent echo 2/22 EF 20-25%, RV moderately reduced - LHC in 2021 showed normal coronaries - suspect tachy mediated CM from long history of chronic, poorly rate controlled Afib/flutter  - s/p CRT-D implant 10/21/21 with Dr. Curt Bears - He does not tolerate rapid Afib well, in setting of severe LV dysfunction  - NYHA III chronically. Volume looks good.  - Continue  losartan 12.5 mg daily. [name] cost-prohibitive) - Continue torsemide 60 mg daily. - Continue digoxin 0.125 mg daily. - Continue  Jardiance 10 mg daily. - Continue metoprolol tartrate 100 mg bid will switch to succinate 100 bid - Continue spiro 12.5 mg daily.   - BP too low to titrate - Labs today   2. Permanent Atrial Fibrillation  - failed multiple cardioversions and AAD therapy . - issues w/ Rapid Afib in the past, complicating HF - Rate control w/ metoprolol and digoxin. CHeck digoxin level. - Followed by Dr. Curt Bears pending CRT-D. Can consdier AVN ablation if needed - On chronic Coumadin, INRs managed by PCP.  - Recheck INR   3. PE, bilateral small sub-segmental - new on CTA 5/23 2/2 sub-therapeutic INR - No DOAC due to interaction with phenobarb. - On Coumadin.   4. Pulmonary Hypertension  - Suspect combination WHO Group 2 and 3 - PFTs in 2018 showed moderate OAD. Also w/ OSA    5. OSA  - Encouraged CPAP compliance . - Stressed importance of CPAP    6. HTN - Well-controlled.  7. Medication non-compliance - Much improved - Continue paramedicine, appreciate their assistance.     Glori Bickers, MD  11:27 AM

## 2021-10-24 NOTE — Telephone Encounter (Signed)
Critical INR reported by the lab INR 4.8. results routed to PCP and [name] w/ paramedicine aware and will follow up with PCP.

## 2021-10-24 NOTE — Patient Instructions (Signed)
Medication Changes:  STOP Metoprolol Tartrate  START Metoprolol Succinate 100 mg Twice daily   Lab Work:  Labs done today, we will call you for abnormal results  We will forward your INR results to your Primary Care Provider  Testing/Procedures:  none  Referrals:  none  Special Instructions // Education:  Do the following things EVERYDAY: Weigh yourself in the morning before breakfast. Write it down and keep it in a log. Take your medicines as prescribed Eat low salt foods--Limit salt (sodium) to 2000 mg per day.  Stay as active as you can everyday Limit all fluids for the day to less than 2 liters   Follow-Up in: 3-4 months  At the [facility], you and your health needs are our priority. We have a designated team specialized in the treatment of Heart Failure. This Care Team includes your primary Heart Failure Specialized Cardiologist (physician), Advanced Practice Providers (APPs- Physician Assistants and Nurse Practitioners), and Pharmacist who all work together to provide you with the care you need, when you need it.   You may see any of the following providers on your designated Care Team at your next follow up:  Dr Glori Bickers Dr Haynes Kerns, NP Lyda Jester, [place] [facility] [place], [place] Audry Riles, PharmD   Please be sure to bring in all your medications bottles to every appointment.   Need to Contact us:  If you have any questions or concerns before your next appointment please send [place] a message through [place] or call our office at [phone].    TO LEAVE A MESSAGE FOR THE NURSE SELECT OPTION 2, PLEASE LEAVE A MESSAGE INCLUDING: YOUR NAME DATE OF BIRTH CALL BACK NUMBER REASON FOR CALL**this is important as we prioritize the call backs  YOU WILL RECEIVE A CALL BACK THE SAME DAY AS LONG AS YOU CALL BEFORE 4:00 PM

## 2021-10-24 NOTE — Addendum Note (Signed)
Encounter addended by: Scarlette Calico, RN on: 10/24/2021 11:52 AM  Actions taken: Diagnosis association updated, Medication long-term status modified, Pharmacy for encounter modified, Order list changed, Pend clinical note, Specialty comments modified, Clinical Note Signed, Charge Capture section accepted

## 2021-10-24 NOTE — Progress Notes (Signed)
Paramedicine Encounter    Patient ID: Ruben Murray, male    DOB: 07-02-1945, 76 y.o.   MRN: [id number]   Met with Raoul in the clinic today who was seen by Dr. Haroldine Laws. Maxine reported some increased shortness of breath on exertion but this is normal for him as of late and he denied any other symptoms.   Vitals were obtained by RN and meds were reviewed and confirmed.   Dr. Haroldine Laws assessed patient and made the following changes:  START: -Metoprolol Succinate 156m bid  STOP: -Metoprolol Tartrate 1022mbid  Labs today taken and INR noted to be 4.8, I contacted patients PCP Dr. HaLutricia FeilN ErDanae Chenho manages patient's INR for advice on coumadin dosing but no returned call as of 1700 today.   I also made his HHSiouxith [facility] Cent[facility]d his [facility] aware of this via text. Alletha confirmed and stated she does not report labs she doesn't obtain to provider. I never heard back from [facility] PoWoodroe Mode   Labs were faxed over by [facility], CMA and I will call PCP again in the morning for follow up.   I filled pill box in the clinic today. Digoxin needed in pill box for week. I made note of refills needed and texted his care taker [name] about same and what to pick up and place in his pill box. I also made her aware there may be some changes with his warfarin once we talk to Dr. HaWelton Flakesshe agreed and understood same.   I reviewed upcoming appointments with Mr. BlAlomarnd plan to see him  in the home next Wednesday- he agreed with plan. Clinic visit complete.   Refills: JaRed RiverEMMitchell/21/2023   Patient Care Team: HaCaren MacadamMD as PCP - General (Family Medicine) SkJerline PainMD as PCP - Cardiology (Cardiology) CaConstance HawMD as PCP - Electrophysiology (Cardiology) HaCaren MacadamMD (Family Medicine) PiBayou GaucheHoCaliforniaf The as Case Manager (Hospice and Palliative  Medicine)  Patient Active Problem List   Diagnosis Date Noted   AKI (acute kidney injury) (HCPoynor05/24/2023   Acute pulmonary embolism (HCRedmond05/23/2023   Acute on chronic systolic CHF (congestive heart failure) (HCBajandas03/14/2023   Hypercoagulable state (HCSelinsgrove03/09/2021   Left leg pain 04/24/2021   OSA on CPAP 04/24/2021   Mitral regurgitation 04/24/2021   Tricuspid regurgitation 04/24/2021   Dilated aortic root (HCRaywick02/19/2023   Permanent atrial fibrillation (HCLowndes02/14/2023   Chronic arthropathy 04/13/2020   Callus 04/13/2020   Onychomycosis 12/12/2019   Left leg cellulitis 1040/98/1191 Chronic systolic heart failure (HCProspect10/07/2019   Dilated cardiomyopathy (HCKaskaskia   Atrial flutter (HCBee Ridge08/30/2021   Atrial flutter with rapid ventricular response (HCMcIntosh08/30/2021   Acquired thrombophilia (HCWaller11/01/2019   Atrial fibrillation with rapid ventricular response (HCMelrose08/26/2019   Chronic combined systolic and diastolic heart failure (HCNew Berlinville08/26/2019   Prolonged QT interval 10/29/2017   Sensorineural hearing loss (SNHL) of both ears 01/31/2017   Dysphonia 10/03/2016   Laryngopharyngeal reflux (LPR) 10/03/2016   Nasal polyps 10/03/2016   Rhinitis medicamentosa 10/03/2016   Chronic laryngitis 10/03/2016   Hoarseness 08/31/2016   Moderate persistent asthma 08/02/2016   COPD (chronic obstructive pulmonary disease) (HCDickey05/30/2018   Morbid (severe) obesity due to excess calories (HCBlanchard05/30/2018   Community acquired pneumonia 03/06/2016   Essential hypertension 03/06/2016   Mixed hyperlipidemia 03/06/2016   Right thyroid  nodule 03/06/2016   Seizures ([place]) 03/06/2016   Fever 03/12/2015   Recurrent erosion of cornea 06/23/2011    Current Outpatient Medications:    acetaminophen (TYLENOL) 500 MG tablet, Take 1,000 mg by mouth at bedtime as needed for mild pain or headache., Disp: , Rfl:    Carboxymethylcellulose Sod PF 0.5 % SOLN, Place 1 drop into both eyes 4 (four) times daily as  needed (dry eyes)., Disp: , Rfl:    Cholecalciferol (VITAMIN D3) 25 MCG (1000 UT) CAPS, Take 1,000 Units by mouth daily., Disp: , Rfl:    digoxin (LANOXIN) 0.125 MG tablet, Take 1 tablet (0.125 mg total) by mouth daily., Disp: 30 tablet, Rfl: 3   empagliflozin (JARDIANCE) 10 MG TABS tablet, Take 1 tablet (10 mg total) by mouth daily before breakfast., Disp: 90 tablet, Rfl: 3   escitalopram (LEXAPRO) 20 MG tablet, Take 20 mg by mouth daily. Takes at bedtime., Disp: , Rfl:    finasteride (PROSCAR) 5 MG tablet, Take 5 mg by mouth daily at 12 noon. , Disp: , Rfl:    fluticasone (FLONASE) 50 MCG/ACT nasal spray, Place 1 spray into both nostrils 2 (two) times daily., Disp: , Rfl:    loratadine (CLARITIN) 10 MG tablet, Take 10 mg by mouth daily., Disp: , Rfl:    losartan (COZAAR) 25 MG tablet, Take 0.5 tablets (12.5 mg total) by mouth daily., Disp: 45 tablet, Rfl: 3   metoprolol succinate (TOPROL-XL) 100 MG 24 hr tablet, Take 1 tablet (100 mg total) by mouth 2 (two) times daily. Take with or immediately following a meal., Disp: 180 tablet, Rfl: 3   Multiple Vitamin (MULTIVITAMIN WITH MINERALS) TABS tablet, Take 1 tablet by mouth daily at 12 noon., Disp: , Rfl:    PHENobarbital (LUMINAL) 97.2 MG tablet, Take 97.2 mg by mouth daily. Takes between 3:00-5:00pm., Disp: , Rfl:    potassium chloride SA (KLOR-CON M) 20 MEQ tablet, Take 1 tablet (20 mEq total) by mouth daily., Disp: 90 tablet, Rfl: 3   pravastatin (PRAVACHOL) 40 MG tablet, Take 40 mg by mouth at bedtime. , Disp: , Rfl:    spironolactone (ALDACTONE) 25 MG tablet, Take 0.5 tablets (12.5 mg total) by mouth daily., Disp: 45 tablet, Rfl: 2   torsemide 60 MG TABS, Take 60 mg by mouth daily., Disp: 30 tablet, Rfl: 1   triamcinolone cream (KENALOG) 0.1 %, Apply 1 application  topically 2 (two) times daily as needed (rash)., Disp: , Rfl:    warfarin (COUMADIN) 1 MG tablet, Take 6 mg by mouth daily., Disp: , Rfl:  Allergies  Allergen Reactions   Dilaudid  [Hydromorphone Hcl] Other (See Comments)    Makes pt hyper    Hydromorphone Other (See Comments)    hyperactiviity Other reaction(s): made him wild Other reaction(s): Unknown   Morphine Sulfate     Other reaction(s): at high dose causes confusion Other reaction(s): at high dose causes confusion Other reaction(s): at high dose causes confusion, Other (See Comments) Other reaction(s): at high dose causes confusion Other reaction(s): at high dose causes confusion    Tegretol [Carbamazepine] Hives   Carbamazepine Hives, Rash and Other (See Comments)    Other reaction(s): hives Other reaction(s): Hives Other reaction(s): hives     Social History   Socioeconomic History   Marital status: Married    Spouse name: Not on file   Number of children: Not on file   Years of education: Not on file   Highest education level: Not on file  Occupational  History   Not on file  Tobacco Use   Smoking status: Former    Packs/day: 3.00    Years: 48.00    Total pack years: 144.00    Types: Cigarettes    Quit date: 2000    Years since quitting: 23.6   Smokeless tobacco: Never  Vaping Use   Vaping Use: Never used  Substance and Sexual Activity   Alcohol use: Never   Drug use: Never   Sexual activity: Never  Other Topics Concern   Not on file  Social History Narrative   ** Merged History Encounter **       Social Determinants of Health   Financial Resource Strain: Medium Risk (09/26/2018)   Overall Financial Resource Strain (CARDIA)    Difficulty of Paying Living Expenses: Somewhat hard  Food Insecurity: No Food Insecurity (06/03/2021)   Hunger Vital Sign    Worried About Running Out of Food in the Last Year: Never true    Ran Out of Food in the Last Year: Never true  Transportation Needs: No Transportation Needs (04/26/2021)   PRAPARE - Hydrologist (Medical): No    Lack of Transportation (Non-Medical): No  Physical Activity: Inactive (12/15/2019)    Exercise Vital Sign    Days of Exercise per Week: 0 days    Minutes of Exercise per Session: 0 min  Stress: Stress Concern Present (09/26/2018)   [place]    Feeling of Stress : To some extent  Social Connections: Not on file  Intimate Partner Violence: Not on file    Physical Exam      Future Appointments  Date Time Provider [place]  11/02/2021 10:00 AM [facility]  11/14/2021  3:00 PM [name], MD [facility]  01/24/2022 11:00 AM [facility] PA/NP MC-[facility] None  01/30/2022  2:30 PM Camnitz, Ocie Doyne, MD [facility]     ACTION: Home visit completed

## 2021-10-25 ENCOUNTER — Telehealth (HOSPITAL_COMMUNITY): Payer: Self-pay

## 2021-10-25 NOTE — Telephone Encounter (Signed)
Received call from [name] PCP office that an RN from [facility] re-checked his INR today at it was 5.4 and that Dr. Welton Flakes would be referring him to Coumadin Clinic in [place]. I will follow up to ensure they receive this referral and that he makes the appointment.    Dr. Welton Flakes wants Mr. Mennenga to hold his warfarin tonight and he will be seen by her in office tomorrow.   I called Mr. Brine and reviewed this with him and he understood and removed warfarin from pill box as instructed.   I will continue to follow up.    [name], [place] 10/25/2021

## 2021-10-26 ENCOUNTER — Telehealth: Payer: Self-pay | Admitting: *Deleted

## 2021-10-26 DIAGNOSIS — E118 Type 2 diabetes mellitus with unspecified complications: Secondary | ICD-10-CM | POA: Diagnosis not present

## 2021-10-26 DIAGNOSIS — I5023 Acute on chronic systolic (congestive) heart failure: Secondary | ICD-10-CM | POA: Diagnosis not present

## 2021-10-26 DIAGNOSIS — Z0000 Encounter for general adult medical examination without abnormal findings: Secondary | ICD-10-CM | POA: Diagnosis not present

## 2021-10-26 DIAGNOSIS — Z7901 Long term (current) use of anticoagulants: Secondary | ICD-10-CM | POA: Diagnosis not present

## 2021-10-26 DIAGNOSIS — F419 Anxiety disorder, unspecified: Secondary | ICD-10-CM | POA: Diagnosis not present

## 2021-10-26 DIAGNOSIS — G4733 Obstructive sleep apnea (adult) (pediatric): Secondary | ICD-10-CM | POA: Diagnosis not present

## 2021-10-26 DIAGNOSIS — G40909 Epilepsy, unspecified, not intractable, without status epilepticus: Secondary | ICD-10-CM | POA: Diagnosis not present

## 2021-10-26 DIAGNOSIS — I10 Essential (primary) hypertension: Secondary | ICD-10-CM | POA: Diagnosis not present

## 2021-10-26 DIAGNOSIS — F324 Major depressive disorder, single episode, in partial remission: Secondary | ICD-10-CM | POA: Diagnosis not present

## 2021-10-26 NOTE — Patient Outreach (Signed)
  Care Coordination   Initial Visit Note   10/26/2021 Name: Ruben Murray MRN: [id number] DOB: 23-Jan-1946  Ruben Murray is a 76 y.o. year old male who sees [name], MD for primary care. I spoke with  Ruben Murray by phone today  What matters to the patients health and wellness today?  Declines Care Coordination- states he has Heart Failure Clinic team support in addition to other Providers, family and friends.    Goals Addressed   None     SDOH assessments and interventions completed:  Yes  SDOH Interventions Today    Flowsheet Row Most Recent Value  SDOH Interventions   Food Insecurity Interventions Intervention Not Indicated  Financial Strain Interventions Intervention Not Indicated  Transportation Interventions Converted Appointment to Virtual Visit, Other (Comment)  [advised pt of [employer] benefits- also has friends who assist with rides]        Care Coordination Interventions Activated:  Yes  Care Coordination Interventions:  No, not indicated   Follow up plan: No further intervention required.   Encounter Outcome:  Pt. Refused

## 2021-10-27 NOTE — Telephone Encounter (Signed)
Spoke to Coumadin Clinic RN Vernie Shanks and was able to get Ruben Murray scheduled in [place] clinic at the request of him and his caregiver who transports him to his appointments. We were able to schedule him on 8/30 at 10:30 for his initial coumadin clinic visit. Ruben Murray and his caregiver Ruben Murray made aware and agreeable. I will follow up in the home next Wednesday also.   [name], [place] 10/27/2021

## 2021-11-01 ENCOUNTER — Telehealth (HOSPITAL_COMMUNITY): Payer: Self-pay

## 2021-11-01 NOTE — Telephone Encounter (Signed)
Called and reminded Mr. Fodor about his appointments tomorrow at HF clinic labs and at Coumadin Clinic. He agreed with plan and I will follow up for home visit after he returns home tomorrow from same. He agreed. Call complete.   [name], [place] 11/01/2021

## 2021-11-02 ENCOUNTER — Other Ambulatory Visit (HOSPITAL_COMMUNITY): Payer: Self-pay | Admitting: Family Medicine

## 2021-11-02 ENCOUNTER — Other Ambulatory Visit (HOSPITAL_COMMUNITY): Payer: Self-pay | Admitting: *Deleted

## 2021-11-02 ENCOUNTER — Ambulatory Visit (INDEPENDENT_AMBULATORY_CARE_PROVIDER_SITE_OTHER): Payer: Medicare PPO

## 2021-11-02 ENCOUNTER — Other Ambulatory Visit (HOSPITAL_COMMUNITY): Payer: Self-pay

## 2021-11-02 ENCOUNTER — Ambulatory Visit: Payer: Medicare PPO

## 2021-11-02 ENCOUNTER — Ambulatory Visit
Admission: RE | Admit: 2021-11-02 | Discharge: 2021-11-02 | Disposition: A | Discharge status: Home / Self Care | Payer: Medicare PPO | Source: Ambulatory Visit | Attending: Cardiology | Admitting: Cardiology

## 2021-11-02 DIAGNOSIS — I2699 Other pulmonary embolism without acute cor pulmonale: Secondary | ICD-10-CM

## 2021-11-02 DIAGNOSIS — Z7901 Long term (current) use of anticoagulants: Secondary | ICD-10-CM | POA: Diagnosis not present

## 2021-11-02 DIAGNOSIS — I5022 Chronic systolic (congestive) heart failure: Secondary | ICD-10-CM

## 2021-11-02 DIAGNOSIS — I4891 Unspecified atrial fibrillation: Secondary | ICD-10-CM | POA: Insufficient documentation

## 2021-11-02 LAB — DIGOXIN LEVEL: Digoxin Level: 0.2 ng/mL — ABNORMAL LOW (ref 0.8–2.0)

## 2021-11-02 LAB — POCT INR: INR: 3.9 — AB (ref 2.0–3.0)

## 2021-11-02 NOTE — Progress Notes (Signed)
Paramedicine Encounter    Patient ID: Ruben Murray, male    DOB: 06-21-1945, 76 y.o.   MRN: [id number] \  Arrived for home visit for Ruben Murray who was seated in his recliner alert and oriented reporting to be feeling okay today. He denied shortness of breath while seated but reports he gets easily short winded when moving around. He denied chest pain, dizziness, swelling or weight gain. He reports he has been compliant with his medications over the last week.   I obtained vitals: WT- 226 BP- 110/60 HR- 93 O2- 95% RR- 16   [name]- Ruben Murray caregiver reports he had a new medication added last week by his PCP Dr. Welton Flakes- Buspar 70m daily. I will add same to list.   He was seen by HF lab today and Digoxin noted to be low- he has been without this for three weeks now due to caregiver not picking it up at pharmacy, [facility] states [facility]. [facility] and I called [name] and she reported her husband FAnnalee Gentawas en route to pick it up and bring it to the home. I was able to place it in his pill box for this week.   CLavontawas seen by coumadin clinic today and anti-coag visit was reviewed.  I reviewed all meds and filled his pill box accordingly.   Refills as noted and called in: Spironolactone Jardiance Losartan  Centrum Multivitamin   All appointments reviewed and confirmed. I will see Mr. BHoffmeierin one week. Home visit complete.   HSalena Saner EDel Rio8/30/2023   Patient Care Team: HCaren Macadam MD as PCP - General (Family Medicine) SJerline Pain MD as PCP - Cardiology (Cardiology) CConstance Haw MD as PCP - Electrophysiology (Cardiology) HCaren Macadam MD (Family Medicine) PDu Pont HCaliforniaOf The as Case Manager ([facility])  Patient Active Problem List   Diagnosis Date Noted   Atrial fibrillation ([facility] 11/02/2021   Long term (current) use of anticoagulants 11/02/2021   AKI (acute  kidney injury) (HOildale 07/27/2021   Acute pulmonary embolism (HDixon 07/26/2021   Acute on chronic systolic CHF (congestive heart failure) (HBrazos Country 05/17/2021   Hypercoagulable state (HMiller 05/10/2021   Left leg pain 04/24/2021   OSA on CPAP 04/24/2021   Mitral regurgitation 04/24/2021   Tricuspid regurgitation 04/24/2021   Dilated aortic root (HSanto Domingo Pueblo 04/24/2021   Permanent atrial fibrillation (HMorrison 04/19/2021   Chronic arthropathy 04/13/2020   Callus 04/13/2020   Onychomycosis 12/12/2019   Left leg cellulitis 149/44/9675  Chronic systolic heart failure (HRuston 12/09/2019   Dilated cardiomyopathy (HManassas    Atrial flutter (HRaymond 11/03/2019   Atrial flutter with rapid ventricular response (HRancho Santa Margarita 11/03/2019   Acquired thrombophilia (HIron Mountain 01/15/2019   Atrial fibrillation with rapid ventricular response (HLeopolis 10/29/2017   Chronic combined systolic and diastolic heart failure (HPort Allegany 10/29/2017   Prolonged QT interval 10/29/2017   Sensorineural hearing loss (SNHL) of both ears 01/31/2017   Dysphonia 10/03/2016   Laryngopharyngeal reflux (LPR) 10/03/2016   Nasal polyps 10/03/2016   Rhinitis medicamentosa 10/03/2016   Chronic laryngitis 10/03/2016   Hoarseness 08/31/2016   Moderate persistent asthma 08/02/2016   COPD (chronic obstructive pulmonary disease) (HSaltsburg 08/02/2016   Morbid (severe) obesity due to excess calories (HSignal Mountain 08/02/2016   Community acquired pneumonia 03/06/2016   Essential hypertension 03/06/2016   Mixed hyperlipidemia 03/06/2016   Right thyroid nodule 03/06/2016   Seizures (HNational Harbor 03/06/2016   Fever 03/12/2015  Recurrent erosion of cornea 06/23/2011    Current Outpatient Medications:    acetaminophen (TYLENOL) 500 MG tablet, Take 1,000 mg by mouth at bedtime as needed for mild pain or headache., Disp: , Rfl:    Carboxymethylcellulose Sod PF 0.5 % SOLN, Place 1 drop into both eyes 4 (four) times daily as needed (dry eyes)., Disp: , Rfl:    Cholecalciferol (VITAMIN D3) 25 MCG (1000  UT) CAPS, Take 1,000 Units by mouth daily., Disp: , Rfl:    digoxin (LANOXIN) 0.125 MG tablet, Take 1 tablet (0.125 mg total) by mouth daily., Disp: 30 tablet, Rfl: 3   empagliflozin (JARDIANCE) 10 MG TABS tablet, Take 1 tablet (10 mg total) by mouth daily before breakfast., Disp: 90 tablet, Rfl: 3   escitalopram (LEXAPRO) 20 MG tablet, Take 20 mg by mouth daily. Takes at bedtime., Disp: , Rfl:    finasteride (PROSCAR) 5 MG tablet, Take 5 mg by mouth daily at 12 noon. , Disp: , Rfl:    fluticasone (FLONASE) 50 MCG/ACT nasal spray, Place 1 spray into both nostrils 2 (two) times daily., Disp: , Rfl:    loratadine (CLARITIN) 10 MG tablet, Take 10 mg by mouth daily., Disp: , Rfl:    losartan (COZAAR) 25 MG tablet, Take 0.5 tablets (12.5 mg total) by mouth daily., Disp: 45 tablet, Rfl: 3   metoprolol succinate (TOPROL-XL) 100 MG 24 hr tablet, Take 1 tablet (100 mg total) by mouth 2 (two) times daily. Take with or immediately following a meal., Disp: 180 tablet, Rfl: 3   Multiple Vitamin (MULTIVITAMIN WITH MINERALS) TABS tablet, Take 1 tablet by mouth daily at 12 noon., Disp: , Rfl:    PHENobarbital (LUMINAL) 97.2 MG tablet, Take 97.2 mg by mouth daily. Takes between 3:00-5:00pm., Disp: , Rfl:    potassium chloride SA (KLOR-CON M) 20 MEQ tablet, Take 1 tablet (20 mEq total) by mouth daily., Disp: 90 tablet, Rfl: 3   pravastatin (PRAVACHOL) 40 MG tablet, Take 40 mg by mouth at bedtime. , Disp: , Rfl:    spironolactone (ALDACTONE) 25 MG tablet, Take 0.5 tablets (12.5 mg total) by mouth daily., Disp: 45 tablet, Rfl: 2   torsemide 60 MG TABS, Take 60 mg by mouth daily., Disp: 30 tablet, Rfl: 1   triamcinolone cream (KENALOG) 0.1 %, Apply 1 application  topically 2 (two) times daily as needed (rash)., Disp: , Rfl:    warfarin (COUMADIN) 1 MG tablet, Take 6 mg by mouth daily., Disp: , Rfl:  Allergies  Allergen Reactions   Dilaudid [Hydromorphone Hcl] Other (See Comments)    Makes pt hyper    Hydromorphone  Other (See Comments)    hyperactiviity Other reaction(s): made him wild Other reaction(s): Unknown   Morphine Sulfate     Other reaction(s): at high dose causes confusion Other reaction(s): at high dose causes confusion Other reaction(s): at high dose causes confusion, Other (See Comments) Other reaction(s): at high dose causes confusion Other reaction(s): at high dose causes confusion    Tegretol [Carbamazepine] Hives   Carbamazepine Hives, Rash and Other (See Comments)    Other reaction(s): hives Other reaction(s): Hives Other reaction(s): hives     Social History   Socioeconomic History   Marital status: Married    Spouse name: Not on file   Number of children: Not on file   Years of education: Not on file   Highest education level: Not on file  Occupational History   Not on file  Tobacco Use   Smoking status:  Former    Packs/day: 3.00    Years: 48.00    Total pack years: 144.00    Types: Cigarettes    Quit date: 2000    Years since quitting: 23.6   Smokeless tobacco: Never  Vaping Use   Vaping Use: Never used  Substance and Sexual Activity   Alcohol use: Never   Drug use: Never   Sexual activity: Never  Other Topics Concern   Not on file  Social History Narrative   ** Merged History Encounter **       Social Determinants of Health   Financial Resource Strain: Medium Risk (10/26/2021)   Overall Financial Resource Strain (CARDIA)    Difficulty of Paying Living Expenses: Somewhat hard  Food Insecurity: No Food Insecurity (10/26/2021)   Hunger Vital Sign    Worried About Running Out of Food in the Last Year: Never true    Ran Out of Food in the Last Year: Never true  Transportation Needs: Unmet Transportation Needs (10/26/2021)   PRAPARE - Hydrologist (Medical): Yes    Lack of Transportation (Non-Medical): No  Physical Activity: Inactive (12/15/2019)   Exercise Vital Sign    Days of Exercise per Week: 0 days    Minutes of  Exercise per Session: 0 min  Stress: Stress Concern Present (09/26/2018)   [place]    Feeling of Stress : To some extent  Social Connections: Not on file  Intimate Partner Violence: Not on file    Physical Exam      Future Appointments  Date Time Provider [place]  11/09/2021  2:30 PM [facility] None  11/14/2021  3:00 PM [name], MD [facility]  01/24/2022 11:00 AM [facility] PA/NP [facility] None  01/30/2022  2:30 PM Camnitz, Ocie Doyne, MD [facility]     ACTION: Home visit completed

## 2021-11-02 NOTE — Patient Instructions (Addendum)
Description   Hold today's dose and then START taking 0.5 tablet (2.15m) daily EXCEPT 1 tablet (52m on Mondays, Wednesday, Fridays, and Saturdays.  Stay consistent with greens (1/2 times per week)  Recheck INR in 1 week  Coumadin Clinic [phone]    A full discussion of the nature of anticoagulants has been carried out.  A benefit risk analysis has been presented to the patient, so that they understand the justification for choosing anticoagulation at this time. The need for frequent and regular monitoring, precise dosage adjustment and compliance is stressed.  Side effects of potential bleeding are discussed.  The patient should avoid any OTC items containing aspirin or ibuprofen, and should avoid great swings in general diet.  Avoid alcohol consumption.  Call if any signs of abnormal bleeding.

## 2021-11-03 DIAGNOSIS — G4733 Obstructive sleep apnea (adult) (pediatric): Secondary | ICD-10-CM | POA: Diagnosis not present

## 2021-11-09 ENCOUNTER — Ambulatory Visit: Payer: Medicare PPO | Attending: Cardiology

## 2021-11-09 ENCOUNTER — Other Ambulatory Visit (HOSPITAL_COMMUNITY): Payer: Self-pay

## 2021-11-09 DIAGNOSIS — I4891 Unspecified atrial fibrillation: Secondary | ICD-10-CM | POA: Diagnosis not present

## 2021-11-09 DIAGNOSIS — Z7901 Long term (current) use of anticoagulants: Secondary | ICD-10-CM

## 2021-11-09 LAB — POCT INR: INR: 2 (ref 2.0–3.0)

## 2021-11-09 NOTE — Progress Notes (Signed)
Paramedicine Encounter    Patient ID: Ruben Murray, male    DOB: [birth year], 76 y.o.   MRN: [id number]    Arrived for home visit for Ruben Murray who reports feeling good today reporting. He denied any shortness of breath more than normal, chest Murray, dizziness over the last week. He reports no side effects that he could tell with warfarin changes. He is grateful this is being better managed.   I obtained vitals and assessment. WT- 228lbs BP- 120/60 HR- 63 O2- 93% RR- 16 Lungs- clear Swelling- none   He has been 100% compliant with medications over the last week. I was able to review meds and confirm same filling pill box minus his Warfarin that Ruben Murray (caretaker) will place after his Coumadin visit today.   Refills to [facility] -Losartan -Multivitamin   (I notified caretaker of same also)  We reviewed upcoming appointments and I wrote down same in calendars. He agreed to home visit in one week and knows to reach out if needed.   Home visit complete.   Ruben Murray, [place] 11/09/2021      Patient Care Team: Ruben Macadam, MD as PCP - General (Family Medicine) Ruben Pain, MD as PCP - Cardiology (Cardiology) Ruben Haw, MD as PCP - Electrophysiology (Cardiology) Ruben Macadam, MD (Family Medicine) Ruben Murray, [place] [employer] as Case Manager [facility] and Palliative Medicine)  Patient Active Problem List   Diagnosis Date Noted   Atrial fibrillation [facility]) 11/02/2021   Long term (current) use of anticoagulants 11/02/2021   AKI (acute kidney injury) ([place]) 07/27/2021   Acute pulmonary embolism ([place]) 07/26/2021   Acute on chronic systolic CHF (congestive heart failure) ([place]) 05/17/2021   Hypercoagulable state ([place]) 05/10/2021   Left leg Murray 04/24/2021   OSA on CPAP 04/24/2021   Mitral regurgitation 04/24/2021   Tricuspid regurgitation 04/24/2021   Dilated aortic root ([place]) 04/24/2021   Permanent atrial fibrillation  ([place]) 04/19/2021   Chronic arthropathy 04/13/2020   Callus 04/13/2020   Onychomycosis 12/12/2019   Left leg cellulitis 78/29/5621   Chronic systolic heart failure ([place]) 12/09/2019   Dilated cardiomyopathy ([place])    Atrial flutter ([place]) 11/03/2019   Atrial flutter with rapid ventricular response ([place]) 11/03/2019   Acquired thrombophilia ([place]) 01/15/2019   Atrial fibrillation with rapid ventricular response ([place]) 10/29/2017   Chronic combined systolic and diastolic heart failure ([place]) 10/29/2017   Prolonged QT interval 10/29/2017   Sensorineural hearing loss (SNHL) of both ears 01/31/2017   Dysphonia 10/03/2016   Laryngopharyngeal reflux (LPR) 10/03/2016   Nasal polyps 10/03/2016   Rhinitis medicamentosa 10/03/2016   Chronic laryngitis 10/03/2016   Hoarseness 08/31/2016   Moderate persistent asthma 08/02/2016   COPD (chronic obstructive pulmonary disease) ([place]) 08/02/2016   Morbid (severe) obesity due to excess calories ([place]) 08/02/2016   Community acquired pneumonia 03/06/2016   Essential hypertension 03/06/2016   Mixed hyperlipidemia 03/06/2016   Right thyroid nodule 03/06/2016   Seizures ([place]) 03/06/2016   Fever 03/12/2015   Recurrent erosion of cornea 06/23/2011    Current Outpatient Medications:    acetaminophen (TYLENOL) 500 MG tablet, Take 1,000 mg by mouth at bedtime as needed for mild Murray or headache., Disp: , Rfl:    busPIRone (BUSPAR) 5 MG tablet, Take 5 mg by mouth daily., Disp: , Rfl:    Carboxymethylcellulose Sod PF 0.5 % SOLN, Place 1 drop into both eyes 4 (four) times daily as needed (dry eyes)., Disp: , Rfl:    Cholecalciferol (  VITAMIN D3) 25 MCG (1000 UT) CAPS, Take 1,000 Units by mouth daily., Disp: , Rfl:    digoxin (LANOXIN) 0.125 MG tablet, Take 1 tablet (0.125 mg total) by mouth daily., Disp: 30 tablet, Rfl: 3   empagliflozin (JARDIANCE) 10 MG TABS tablet, Take 1 tablet (10 mg total) by mouth daily before breakfast., Disp: 90 tablet, Rfl: 3   escitalopram  (LEXAPRO) 20 MG tablet, Take 20 mg by mouth daily. Takes at bedtime., Disp: , Rfl:    finasteride (PROSCAR) 5 MG tablet, Take 5 mg by mouth daily at 12 noon. , Disp: , Rfl:    fluticasone (FLONASE) 50 MCG/ACT nasal spray, Place 1 spray into both nostrils 2 (two) times daily., Disp: , Rfl:    loratadine (CLARITIN) 10 MG tablet, Take 10 mg by mouth daily., Disp: , Rfl:    losartan (COZAAR) 25 MG tablet, TAKE 1/2 TABLET BY MOUTH EVERY DAY, Disp: 45 tablet, Rfl: 3   metoprolol succinate (TOPROL-XL) 100 MG 24 hr tablet, Take 1 tablet (100 mg total) by mouth 2 (two) times daily. Take with or immediately following a meal., Disp: 180 tablet, Rfl: 3   Multiple Vitamin (MULTIVITAMIN WITH MINERALS) TABS tablet, Take 1 tablet by mouth daily at 12 noon., Disp: , Rfl:    PHENobarbital (LUMINAL) 97.2 MG tablet, Take 97.2 mg by mouth daily. Takes between 3:00-5:00pm., Disp: , Rfl:    potassium chloride SA (KLOR-CON M) 20 MEQ tablet, Take 1 tablet (20 mEq total) by mouth daily., Disp: 90 tablet, Rfl: 3   pravastatin (PRAVACHOL) 40 MG tablet, Take 40 mg by mouth at bedtime. , Disp: , Rfl:    spironolactone (ALDACTONE) 25 MG tablet, Take 0.5 tablets (12.5 mg total) by mouth daily., Disp: 45 tablet, Rfl: 2   torsemide 60 MG TABS, Take 60 mg by mouth daily., Disp: 30 tablet, Rfl: 1   triamcinolone cream (KENALOG) 0.1 %, Apply 1 application  topically 2 (two) times daily as needed (rash)., Disp: , Rfl:    warfarin (COUMADIN) 1 MG tablet, Take 6 mg by mouth daily., Disp: , Rfl:  Allergies  Allergen Reactions   Dilaudid [Hydromorphone Hcl] Other (See Comments)    Makes pt hyper    Hydromorphone Other (See Comments)    hyperactiviity Other reaction(s): made him wild Other reaction(s): Unknown   Morphine Sulfate     Other reaction(s): at high dose causes confusion Other reaction(s): at high dose causes confusion Other reaction(s): at high dose causes confusion, Other (See Comments) Other reaction(s): at high dose  causes confusion Other reaction(s): at high dose causes confusion    Tegretol [Carbamazepine] Hives   Carbamazepine Hives, Rash and Other (See Comments)    Other reaction(s): hives Other reaction(s): Hives Other reaction(s): hives     Social History   Socioeconomic History   Marital status: Married    Spouse name: Not on file   Number of children: Not on file   Years of education: Not on file   Highest education level: Not on file  Occupational History   Not on file  Tobacco Use   Smoking status: Former    Packs/day: 3.00    Years: 48.00    Total pack years: 144.00    Types: Cigarettes    Quit date: 2000    Years since quitting: 23.6   Smokeless tobacco: Never  Vaping Use   Vaping Use: Never used  Substance and Sexual Activity   Alcohol use: Never   Drug use: Never   Sexual  activity: Never  Other Topics Concern   Not on file  Social History Narrative   ** Merged History Encounter **       Social Determinants of Health   Financial Resource Strain: Medium Risk (10/26/2021)   Overall Financial Resource Strain (CARDIA)    Difficulty of Paying Living Expenses: Somewhat hard  Food Insecurity: No Food Insecurity (10/26/2021)   Hunger Vital Sign    Worried About Running Out of Food in the Last Year: Never true    Ran Out of Food in the Last Year: Never true  Transportation Needs: Unmet Transportation Needs (10/26/2021)   PRAPARE - Hydrologist (Medical): Yes    Lack of Transportation (Non-Medical): No  Physical Activity: Inactive (12/15/2019)   Exercise Vital Sign    Days of Exercise per Week: 0 days    Minutes of Exercise per Session: 0 min  Stress: Stress Concern Present (09/26/2018)   [place]    Feeling of Stress : To some extent  Social Connections: Not on file  Intimate Partner Violence: Not on file    Physical Exam      Future Appointments  Date Time  Provider [place]  11/09/2021  2:30 PM [facility] None  11/14/2021  3:00 PM Ruben Pain, MD [facility]  01/24/2022 11:00 AM [facility] PA/NP [facility] None  01/30/2022  2:30 PM Camnitz, Ocie Doyne, MD [facility]     ACTION: Home visit completed

## 2021-11-09 NOTE — Patient Instructions (Signed)
Description   Take 1.5 tablets today and then continue taking 0.5 tablet (2.15m) daily EXCEPT 1 tablet (563m on Mondays, Wednesday, Fridays, and Saturdays.  Stay consistent with greens (1-2 times per week)  Recheck INR in 1 week  Cou[facility]in [facility] [phone]

## 2021-11-11 DIAGNOSIS — I5042 Chronic combined systolic (congestive) and diastolic (congestive) heart failure: Secondary | ICD-10-CM | POA: Diagnosis not present

## 2021-11-11 DIAGNOSIS — I10 Essential (primary) hypertension: Secondary | ICD-10-CM | POA: Diagnosis not present

## 2021-11-11 DIAGNOSIS — G40909 Epilepsy, unspecified, not intractable, without status epilepticus: Secondary | ICD-10-CM | POA: Diagnosis not present

## 2021-11-11 DIAGNOSIS — F324 Major depressive disorder, single episode, in partial remission: Secondary | ICD-10-CM | POA: Diagnosis not present

## 2021-11-11 DIAGNOSIS — E118 Type 2 diabetes mellitus with unspecified complications: Secondary | ICD-10-CM | POA: Diagnosis not present

## 2021-11-11 DIAGNOSIS — F419 Anxiety disorder, unspecified: Secondary | ICD-10-CM | POA: Diagnosis not present

## 2021-11-11 DIAGNOSIS — I4811 Longstanding persistent atrial fibrillation: Secondary | ICD-10-CM | POA: Diagnosis not present

## 2021-11-11 DIAGNOSIS — Z23 Encounter for immunization: Secondary | ICD-10-CM | POA: Diagnosis not present

## 2021-11-11 DIAGNOSIS — E7800 Pure hypercholesterolemia, unspecified: Secondary | ICD-10-CM | POA: Diagnosis not present

## 2021-11-14 ENCOUNTER — Ambulatory Visit: Payer: Medicare PPO | Attending: Cardiology | Admitting: Cardiology

## 2021-11-14 ENCOUNTER — Encounter: Payer: Self-pay | Admitting: Cardiology

## 2021-11-14 VITALS — BP 112/62 | HR 67 | Ht 68.0 in | Wt 231.2 lb

## 2021-11-14 DIAGNOSIS — Z111 Encounter for screening for respiratory tuberculosis: Secondary | ICD-10-CM | POA: Diagnosis not present

## 2021-11-14 DIAGNOSIS — I4821 Permanent atrial fibrillation: Secondary | ICD-10-CM | POA: Diagnosis not present

## 2021-11-14 DIAGNOSIS — I5022 Chronic systolic (congestive) heart failure: Secondary | ICD-10-CM

## 2021-11-14 DIAGNOSIS — I420 Dilated cardiomyopathy: Secondary | ICD-10-CM | POA: Diagnosis not present

## 2021-11-14 NOTE — Patient Instructions (Signed)
Medication Instructions:  The current medical regimen is effective;  continue present plan and medications.  *If you need a refill on your cardiac medications before your next appointment, please call your pharmacy*  Follow-Up: At [facility], you and your health needs are our priority.  As part of our continuing mission to provide you with exceptional heart care, we have created designated Provider Care Teams.  These Care Teams include your primary Cardiologist (physician) and Advanced Practice Providers (APPs -  Physician Assistants and Nurse Practitioners) who all work together to provide you with the care you need, when you need it.  We recommend signing up for the patient portal called "MyChart".  Sign up information is provided on this After Visit Summary.  MyChart is used to connect with patients for Virtual Visits (Telemedicine).  Patients are able to view lab/test results, encounter notes, upcoming appointments, etc.  Non-urgent messages can be sent to your provider as well.   To learn more about what you can do with MyChart, go to [email or web address].    Your next appointment:   6 month(s)  The format for your next appointment:   In Person  Provider:   [name], MD      Important Information About Sugar

## 2021-11-14 NOTE — Progress Notes (Signed)
Cardiology Office Note:    Date:  11/14/2021   ID:  Ruben Murray, DOB [birth year], MRN [id number]  PCP:  [name], MD   [facility] Providers Cardiologist:  [name], MD Electrophysiologist:  [name], MD     Referring MD: [name], MD    History of Present Illness:    Ruben Murray is a 76 y.o. male here for follow-up of nonischemic cardiomyopathy.  Previously seen by Dr. Haroldine Laws as well as Dr. Curt Bears.  In review from Dr. Clayborne Dana recent note: HFrEF d/t NICM, permanent A fib, pulmonary HTN,  OSA, seizure d/o, COPD and HTN   Echo 2018 EF 40-45%   Lost to cardiology f/u 2022.    Admitted  04/2021 for a/c CHF in the setting of rapid afib. Echo EF 20-25%, RV mildly reduced, RVSP 48 mmHg, mild MR, mild TR. He was diuresed w/ IV Lasix. Afib treated w/ rate control, w/ metoprolol and digoxin. Amiodarone discontinued given chronic afib.  Wt charted at 227 lb.    He was seen by Dr. Curt Bears . Plan is for CRT-D implant w/ plans to consider AV node ablation if further issues w/ rapid afib.    Had initial TOC visit 05/04/2021. HF meds adjusted, but he did not start them.    Admitted 05/17/21 with AF RVR and A/C HFrEF.  Prior to admit he never picked up digoxin, Toprol, or Jardiance. Diuresed with IV lasix.  Discharged on lopressor 100 bid, digoxin 0.25 , and torsemide 40 mg bid. Discharge weight 214 pounds.   Seen in [facility] 3/23, volume trending up. Torsemide increased and spiro started. Referred to paramedicine.   Readmitted 5/23 with AFib with RVR, HR 150's, INR 3.8. CTA chest with small PE w/ R heart strain, also with volume overload. Echo with EF 20%. No DOAC due to hx of seizures and phenobarbital use.  AFib controlled with metoprolol 100 bid and digoxin (dig previously stopped due to elevated level). PMT consulted for [place], and he remained DNR. He was discharged home with HH, weight 228 lbs.   Scheduled for CRT-D implant 10/21/21 with Dr. Curt Bears but  cancelled due to INR 4.3    HF follow up with paramedic, Nira [name]. Stable NYHA III. SOB with minimal exertion. No change. No CP, edema, orthopnea or PND. Compliant with meds. AF rates 70-90s per Paramedicine.   Today: Comfortable in chair.  Has his friend with him who helps drive him.  His wife also helps him with his medications.  Appreciates paramedics helping him with organizing his medications.  Also appreciates Dr. Mannie Stabile working with him and his wife looking for assisted living potentially.  His wife has dementia and broken hips.  Cardiac Testing  - Echo 2/18 EF 40-45%, RV normal, mod TR - Echo 8/19 EF 25%, RV moderately reduced (in Afib) - Echo 1/20 EF 40-45%, RV mildly dilated, systolic fx normal  - Echo 8/21 EF 20-25%, RV moderately reduced, RVSP 38, Mild MR, mild TR - LHC 9/21 Normal coronaries, RHC mild pulmonary hypertension; PA 34/22, mean PA pressure 28 mmHg  - Echo 2/23 EF 20-25%, RV mildly reduced, RVSP 48, mild MR, mild TR  - Echo 5/23: EF 20%, RV moderately down, moderate to severe TR, mild MR (he was in AF and volume overloaded).     Past Medical History:  Diagnosis Date   Acquired thrombophilia ([place]) 01/15/2019   Aortic atherosclerosis ([place])    Aortic root dilatation (HCC)    CAP (community acquired pneumonia) 03/06/2016  Chronic laryngitis 39/76/7341   Chronic systolic CHF (congestive heart failure) ([place])    COPD GOLD II  08/02/2016   Quit smoking 2000  PFT's  07/10/2016  FEV1 1.98 (70 % ) ratio 67  p 19 % improvement from saba p nothing  prior to study while of coreg so rec as of 08/02/2016 try off coreg and on bisoprolol      Essential hypertension 03/06/2016   Changed from coreg to bisoprolol due to copd with reversible component  08/02/2016 >>>    Hoarseness 08/31/2016   Referred to ent 08/31/2016 >>> seen 10/03/16 [name] dx gerd and rhinitis medicamentosa   > improved on f/u 01/31/17 on gerd rx/ flonase and off afrin   Hyperlipidemia    Hypertension     Laryngopharyngeal reflux (LPR) 10/03/2016   Mitral regurgitation    Moderate persistent asthma 08/02/2016   Morbid (severe) obesity due to excess calories ([place]) 08/02/2016   Nasal polyps 10/03/2016   NICM (nonischemic cardiomyopathy) ([place])    a. EF 40-45% in 04/2016, etiology not defined, managed medically.   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   Rhinitis medicamentosa 10/03/2016   Right thyroid nodule 03/06/2016   Seizures ([place])    "take RX daily" (03/06/2016)   Sensorineural hearing loss (SNHL) of both ears 01/31/2017   Tricuspid regurgitation     Past Surgical History:  Procedure Laterality Date   CARDIOVERSION N/A 12/13/2017   Procedure: CARDIOVERSION;  Surgeon: [name], MD;  Location: [place];  Service: Cardiovascular;  Laterality: N/A;   CARDIOVERSION N/A 09/25/2018   Procedure: CARDIOVERSION;  Surgeon: [name], MD;  Location: [facility] ENDOSCOPY;  Service: Cardiovascular;  Laterality: N/A;   CARDIOVERSION N/A 11/04/2018   Procedure: CARDIOVERSION;  Surgeon: [name], MD;  Location: [facility] ENDOSCOPY;  Service: Cardiovascular;  Laterality: N/A;   CARDIOVERSION N/A 04/11/2019   Procedure: CARDIOVERSION;  Surgeon: [name], MD;  Location: [facility] ENDOSCOPY;  Service: Cardiovascular;  Laterality: N/A;   CIRCUMCISION     JOINT REPLACEMENT     PERCUTANEOUS PINNING TOE FRACTURE Left    "big toe   REPLACEMENT TOTAL KNEE Left    RIGHT/LEFT HEART CATH AND CORONARY ANGIOGRAPHY N/A 11/06/2019   Procedure: RIGHT/LEFT HEART CATH AND CORONARY ANGIOGRAPHY;  Surgeon: [name], MD;  Location: [place] CV LAB;  Service: Cardiovascular;  Laterality: N/A;   TONSILLECTOMY AND ADENOIDECTOMY      Current Medications: Current Meds  Medication Sig   acetaminophen (TYLENOL) 500 MG tablet Take 1,000 mg by mouth at bedtime as needed for mild pain or headache.   busPIRone  (BUSPAR) 5 MG tablet Take 10 mg by mouth daily.   Carboxymethylcellulose Sod PF 0.5 % SOLN Place 1 drop into both eyes 4 (four) times daily as needed (dry eyes).   Cholecalciferol (VITAMIN D3) 25 MCG (1000 UT) CAPS Take 1,000 Units by mouth daily.   digoxin (LANOXIN) 0.125 MG tablet Take 1 tablet (0.125 mg total) by mouth daily.   empagliflozin (JARDIANCE) 10 MG TABS tablet Take 1 tablet (10 mg total) by mouth daily before breakfast.   escitalopram (LEXAPRO) 20 MG tablet Take 20 mg by mouth daily. Takes at bedtime.   finasteride (PROSCAR) 5 MG tablet Take 5 mg by mouth daily at 12 noon.    fluticasone (FLONASE) 50 MCG/ACT nasal spray Place 1 spray into both nostrils 2 (two)  times daily.   loratadine (CLARITIN) 10 MG tablet Take 10 mg by mouth daily.   losartan (COZAAR) 25 MG tablet TAKE 1/2 TABLET BY MOUTH EVERY DAY   metoprolol succinate (TOPROL-XL) 100 MG 24 hr tablet Take 1 tablet (100 mg total) by mouth 2 (two) times daily. Take with or immediately following a meal.   Multiple Vitamin (MULTIVITAMIN WITH MINERALS) TABS tablet Take 1 tablet by mouth daily at 12 noon.   PHENobarbital (LUMINAL) 97.2 MG tablet Take 97.2 mg by mouth daily. Takes between 3:00-5:00pm.   potassium chloride SA (KLOR-CON M) 20 MEQ tablet Take 1 tablet (20 mEq total) by mouth daily.   pravastatin (PRAVACHOL) 40 MG tablet Take 40 mg by mouth at bedtime.    spironolactone (ALDACTONE) 25 MG tablet Take 0.5 tablets (12.5 mg total) by mouth daily.   torsemide 60 MG TABS Take 60 mg by mouth daily.   triamcinolone cream (KENALOG) 0.1 % Apply 1 application  topically 2 (two) times daily as needed (rash).   warfarin (COUMADIN) 1 MG tablet Take 6 mg by mouth daily.     Allergies:   Dilaudid [hydromorphone hcl], Hydromorphone, Morphine sulfate, Tegretol [carbamazepine], and Carbamazepine   Social History   Socioeconomic History   Marital status: Married    Spouse name: Not on file   Number of children: Not on file   Years  of education: Not on file   Highest education level: Not on file  Occupational History   Not on file  Tobacco Use   Smoking status: Former    Packs/day: 3.00    Years: 48.00    Total pack years: 144.00    Types: Cigarettes    Quit date: 2000    Years since quitting: 23.7   Smokeless tobacco: Never  Vaping Use   Vaping Use: Never used  Substance and Sexual Activity   Alcohol use: Never   Drug use: Never   Sexual activity: Never  Other Topics Concern   Not on file  Social History Narrative   ** Merged History Encounter **       Social Determinants of Health   Financial Resource Strain: Medium Risk (10/26/2021)   Overall Financial Resource Strain (CARDIA)    Difficulty of Paying Living Expenses: Somewhat hard  Food Insecurity: No Food Insecurity (10/26/2021)   Hunger Vital Sign    Worried About Running Out of Food in the Last Year: Never true    Ran Out of Food in the Last Year: Never true  Transportation Needs: Unmet Transportation Needs (10/26/2021)   PRAPARE - Hydrologist (Medical): Yes    Lack of Transportation (Non-Medical): No  Physical Activity: Inactive (12/15/2019)   Exercise Vital Sign    Days of Exercise per Week: 0 days    Minutes of Exercise per Session: 0 min  Stress: Stress Concern Present (09/26/2018)   [place]    Feeling of Stress : To some extent  Social Connections: Not on file     Family History: The patient's family history includes Hypertension in his mother; Other in his father.  ROS:   Please see the history of present illness.     All other systems reviewed and are negative.  EKGs/Labs/Other Studies Reviewed:      Recent Labs: 04/19/2021: TSH 1.898 08/01/2021: Magnesium 2.4 10/24/2021: ALT 26; B Natriuretic Peptide 222.7; BUN 24; Creatinine, Ser 1.18; Hemoglobin 14.9; Platelets 205; Potassium 4.0; Sodium 138  Recent  Lipid Panel    Component  Value Date/Time   CHOL 110 07/30/2021 0256   TRIG 67 07/30/2021 0256   HDL 38 (L) 07/30/2021 0256   CHOLHDL 2.9 07/30/2021 0256   VLDL 13 07/30/2021 0256   LDLCALC 59 07/30/2021 0256     Risk Assessment/Calculations:              Physical Exam:    VS:  BP 112/62   Pulse 67   Ht _0  (1.727 m)   Wt 231 lb 3.2 oz (104.9 kg)   SpO2 95%   BMI 35.15 kg/m     Wt Readings from Last 3 Encounters:  11/14/21 231 lb 3.2 oz (104.9 kg)  11/09/21 228 lb (103.4 kg)  11/02/21 226 lb (102.5 kg)     GEN:  Well nourished, well developed in no acute distress HEENT: Normal NECK: No JVD; No carotid bruits LYMPHATICS: No lymphadenopathy CARDIAC: Irregularly irregular, no murmurs, no rubs, gallops RESPIRATORY:  Clear to auscultation without rales, wheezing or rhonchi  ABDOMEN: Soft, non-tender, non-distended MUSCULOSKELETAL:  No edema; No deformity  SKIN: Warm and dry NEUROLOGIC:  Alert and oriented x 3 PSYCHIATRIC:  Normal affect   ASSESSMENT:    1. Chronic systolic heart failure ([place])   2. Dilated cardiomyopathy ([place])   3. Permanent atrial fibrillation ([place])    PLAN:    In order of problems listed above:   1. Chronic Systolic Heart Failure - NICM. Dates back to at least 2018, EFs have fluctuated from 20-40% - most recent echo 2/22 EF 20-25%, RV moderately reduced - LHC in 2021 showed normal coronaries - suspect tachy mediated CM from long history of chronic, poorly rate controlled Afib/flutter  -Was planning CRT-D implant 10/21/21 with Dr. Curt Bears but canceled secondary to high INR - He does not tolerate rapid Afib well, in setting of severe LV dysfunction  - NYHA III chronically. Volume looks good.  - Continue losartan 12.5 mg daily. [name] cost-prohibitive) - Continue torsemide 60 mg daily. - Continue digoxin 0.125 mg daily. - Continue Jardiance 10 mg daily. - Continue metoprolol tartrate 100 mg bid will switch to succinate 100 bid - Continue spiro 12.5 mg daily.    - BP too low to titrate -Blood work reviewed, potassium and creatinine stable.   2. Permanent Atrial Fibrillation  - failed multiple cardioversions and AAD therapy . - issues w/ Rapid Afib in the past, complicating HF - Rate control w/ metoprolol and digoxin. CHeck digoxin level. - Followed by Dr. Curt Bears pending CRT-D. Can consdier AVN ablation if needed - On chronic Coumadin, INRs managed by PCP.  Dr. Mannie Stabile, challenging control.   3. PE, bilateral small sub-segmental - new on CTA 5/23 2/2 sub-therapeutic INR - No DOAC due to interaction with phenobarb. - On Coumadin.  Trying to maintain INR between 2 and 3.   4. Pulmonary Hypertension  - Suspect combination WHO Group 2 and 3 - PFTs in 2018 showed moderate OAD. Also w/ OSA stable   5. OSA  - Encouraged CPAP compliance . - Stressed importance of CPAP, no changes   6. HTN - Well-controlled.  Stable   7. Medication non-compliance - Much improved, appreciate Dr. Haroldine Laws and his heart failure team. - Continue paramedicine, appreciate their assistance.         Medication Adjustments/Labs and Tests Ordered: Current medicines are reviewed at length with the patient today.  Concerns regarding medicines are outlined above.  No orders of the defined types were placed in this  encounter.  No orders of the defined types were placed in this encounter.   Patient Instructions  Medication Instructions:  The current medical regimen is effective;  continue present plan and medications.  *If you need a refill on your cardiac medications before your next appointment, please call your pharmacy*  Follow-Up: At [facility], you and your health needs are our priority.  As part of our continuing mission to provide you with exceptional heart care, we have created designated Provider Care Teams.  These Care Teams include your primary Cardiologist (physician) and Advanced Practice Providers (APPs -  Physician Assistants and Nurse  Practitioners) who all work together to provide you with the care you need, when you need it.  We recommend signing up for the patient portal called "MyChart".  Sign up information is provided on this After Visit Summary.  MyChart is used to connect with patients for Virtual Visits (Telemedicine).  Patients are able to view lab/test results, encounter notes, upcoming appointments, etc.  Non-urgent messages can be sent to your provider as well.   To learn more about what you can do with MyChart, go to [email or web address].    Your next appointment:   6 month(s)  The format for your next appointment:   In Person  Provider:   [name], MD      Important Information About Sugar         Signed, [name], MD  11/14/2021 3:44 PM    [place]

## 2021-11-16 ENCOUNTER — Ambulatory Visit: Payer: Medicare PPO

## 2021-11-17 ENCOUNTER — Other Ambulatory Visit (HOSPITAL_COMMUNITY): Payer: Self-pay

## 2021-11-17 ENCOUNTER — Ambulatory Visit: Payer: Medicare PPO | Attending: Cardiology

## 2021-11-17 ENCOUNTER — Other Ambulatory Visit (HOSPITAL_COMMUNITY): Payer: Self-pay | Admitting: Internal Medicine

## 2021-11-17 DIAGNOSIS — Z7901 Long term (current) use of anticoagulants: Secondary | ICD-10-CM | POA: Diagnosis not present

## 2021-11-17 DIAGNOSIS — I5020 Unspecified systolic (congestive) heart failure: Secondary | ICD-10-CM

## 2021-11-17 DIAGNOSIS — I4891 Unspecified atrial fibrillation: Secondary | ICD-10-CM | POA: Diagnosis not present

## 2021-11-17 LAB — POCT INR: INR: 1.2 — AB (ref 2.0–3.0)

## 2021-11-17 NOTE — Progress Notes (Signed)
Paramedicine Encounter    Patient ID: Ruben Murray, male    DOB: 13-Jan-1946, 77 y.o.   MRN: [id number]  Arrived for home visit for Ruben Murray who reports feeling okay today just tired. [facility] where his INR was 1.2. He denied eating extra greens or missing any dosing's of his medication.  He denied chest Murray, shortness of breath, dizziness, swelling or missed doses of medications.   I obtained vitals and assessment. WT- 228lbs BP- 110/60 HR- 66 O2- 95% RR- 18  No lower leg swelling.  Lungs clear.  I reviewed medications and filled one week of medications in his pill box also following his anti-coag plans per coumadin clinic.   Refills: Jardiance  Buspar ([facility]) -Caregiver Ruben Murray made aware and she plans to pick up same.   I reviewed upcoming appointments with Ruben Murray and wrote down same on his calendar.   He reports he and his PCP and caregiver Ruben Murray have been discussing some veterans benefits that would assist with possible in home care for him and his wife. They report they will update me when they know more.   Ruben Murray reports he has no other complaints or needs at present. I plan to see him in one week on Wednesday. He agreed with plan. Home visit complete.   Ruben Murray, Ruben Murray 11/17/2021    Patient Care Team: Ruben Macadam, MD as PCP - General (Family Medicine) Ruben Pain, MD as PCP - Cardiology (Cardiology) Ruben Haw, MD as PCP - Electrophysiology (Cardiology) Ruben Macadam, MD (Family Medicine) [place], [place] [facility] [employer] as Case Manager [facility] and Palliative Medicine)  Patient Active Problem List   Diagnosis Date Noted   Atrial fibrillation [facility]) 11/02/2021   Long term (current) use of anticoagulants 11/02/2021   AKI (acute kidney injury) ([place]) 07/27/2021   Acute pulmonary embolism ([employer]) 07/26/2021   Acute on chronic systolic CHF (congestive heart failure) ([place])  05/17/2021   Hypercoagulable state ([place]) 05/10/2021   Left leg Murray 04/24/2021   OSA on CPAP 04/24/2021   Mitral regurgitation 04/24/2021   Tricuspid regurgitation 04/24/2021   Dilated aortic root ([place]) 04/24/2021   Permanent atrial fibrillation ([place]) 04/19/2021   Chronic arthropathy 04/13/2020   Callus 04/13/2020   Onychomycosis 12/12/2019   Left leg cellulitis 97/94/8016   Chronic systolic heart failure ([place]) 12/09/2019   Dilated cardiomyopathy ([place])    Atrial flutter ([place]) 11/03/2019   Atrial flutter with rapid ventricular response ([place]) 11/03/2019   Acquired thrombophilia ([place]) 01/15/2019   Atrial fibrillation with rapid ventricular response ([place]) 10/29/2017   Chronic combined systolic and diastolic heart failure ([place]) 10/29/2017   Prolonged QT interval 10/29/2017   Sensorineural hearing loss (SNHL) of both ears 01/31/2017   Dysphonia 10/03/2016   Laryngopharyngeal reflux (LPR) 10/03/2016   Nasal polyps 10/03/2016   Rhinitis medicamentosa 10/03/2016   Chronic laryngitis 10/03/2016   Hoarseness 08/31/2016   Moderate persistent asthma 08/02/2016   COPD (chronic obstructive pulmonary disease) ([place]) 08/02/2016   Morbid (severe) obesity due to excess calories ([place]) 08/02/2016   Community acquired pneumonia 03/06/2016   Essential hypertension 03/06/2016   Mixed hyperlipidemia 03/06/2016   Right thyroid nodule 03/06/2016   Seizures ([place]) 03/06/2016   Fever 03/12/2015   Recurrent erosion of cornea 06/23/2011    Current Outpatient Medications:    acetaminophen (TYLENOL) 500 MG tablet, Take 1,000 mg by mouth at bedtime as needed for mild Murray or headache., Disp: , Rfl:  busPIRone (BUSPAR) 5 MG tablet, Take 10 mg by mouth daily., Disp: , Rfl:    Carboxymethylcellulose Sod PF 0.5 % SOLN, Place 1 drop into both eyes 4 (four) times daily as needed (dry eyes)., Disp: , Rfl:    Cholecalciferol (VITAMIN D3) 25 MCG (1000 UT) CAPS, Take 1,000 Units by mouth daily., Disp: , Rfl:     digoxin (LANOXIN) 0.125 MG tablet, Take 1 tablet (0.125 mg total) by mouth daily., Disp: 30 tablet, Rfl: 3   empagliflozin (JARDIANCE) 10 MG TABS tablet, Take 1 tablet (10 mg total) by mouth daily before breakfast., Disp: 90 tablet, Rfl: 3   escitalopram (LEXAPRO) 20 MG tablet, Take 20 mg by mouth daily. Takes at bedtime., Disp: , Rfl:    finasteride (PROSCAR) 5 MG tablet, Take 5 mg by mouth daily at 12 noon. , Disp: , Rfl:    fluticasone (FLONASE) 50 MCG/ACT nasal spray, Place 1 spray into both nostrils 2 (two) times daily., Disp: , Rfl:    loratadine (CLARITIN) 10 MG tablet, Take 10 mg by mouth daily., Disp: , Rfl:    losartan (COZAAR) 25 MG tablet, TAKE 1/2 TABLET BY MOUTH EVERY DAY, Disp: 45 tablet, Rfl: 3   metoprolol succinate (TOPROL-XL) 100 MG 24 hr tablet, Take 1 tablet (100 mg total) by mouth 2 (two) times daily. Take with or immediately following a meal., Disp: 180 tablet, Rfl: 3   Multiple Vitamin (MULTIVITAMIN WITH MINERALS) TABS tablet, Take 1 tablet by mouth daily at 12 noon., Disp: , Rfl:    PHENobarbital (LUMINAL) 97.2 MG tablet, Take 97.2 mg by mouth daily. Takes between 3:00-5:00pm., Disp: , Rfl:    potassium chloride SA (KLOR-CON M) 20 MEQ tablet, Take 1 tablet (20 mEq total) by mouth daily., Disp: 90 tablet, Rfl: 3   pravastatin (PRAVACHOL) 40 MG tablet, Take 40 mg by mouth at bedtime. , Disp: , Rfl:    spironolactone (ALDACTONE) 25 MG tablet, Take 0.5 tablets (12.5 mg total) by mouth daily., Disp: 45 tablet, Rfl: 2   torsemide 60 MG TABS, Take 60 mg by mouth daily., Disp: 30 tablet, Rfl: 1   triamcinolone cream (KENALOG) 0.1 %, Apply 1 application  topically 2 (two) times daily as needed (rash)., Disp: , Rfl:    warfarin (COUMADIN) 1 MG tablet, Take 6 mg by mouth daily., Disp: , Rfl:  Allergies  Allergen Reactions   Dilaudid [Hydromorphone Hcl] Other (See Comments)    Makes pt hyper    Hydromorphone Other (See Comments)    hyperactiviity Other reaction(s): made him  wild Other reaction(s): Unknown   Morphine Sulfate     Other reaction(s): at high dose causes confusion Other reaction(s): at high dose causes confusion Other reaction(s): at high dose causes confusion, Other (See Comments) Other reaction(s): at high dose causes confusion Other reaction(s): at high dose causes confusion    Tegretol [Carbamazepine] Hives   Carbamazepine Hives, Rash and Other (See Comments)    Other reaction(s): hives Other reaction(s): Hives Other reaction(s): hives     Social History   Socioeconomic History   Marital status: Married    Spouse name: Not on file   Number of children: Not on file   Years of education: Not on file   Highest education level: Not on file  Occupational History   Not on file  Tobacco Use   Smoking status: Former    Packs/day: 3.00    Years: 48.00    Total pack years: 144.00    Types: Cigarettes  Quit date: 2000    Years since quitting: 23.7   Smokeless tobacco: Never  Vaping Use   Vaping Use: Never used  Substance and Sexual Activity   Alcohol use: Never   Drug use: Never   Sexual activity: Never  Other Topics Concern   Not on file  Social History Narrative   ** Merged History Encounter **       Social Determinants of Health   Financial Resource Strain: Medium Risk (10/26/2021)   Overall Financial Resource Strain (CARDIA)    Difficulty of Paying Living Expenses: Somewhat hard  Food Insecurity: No Food Insecurity (10/26/2021)   Hunger Vital Sign    Worried About Running Out of Food in the Last Year: Never true    Ran Out of Food in the Last Year: Never true  Transportation Needs: Unmet Transportation Needs (10/26/2021)   PRAPARE - Hydrologist (Medical): Yes    Lack of Transportation (Non-Medical): No  Physical Activity: Inactive (12/15/2019)   Exercise Vital Sign    Days of Exercise per Week: 0 days    Minutes of Exercise per Session: 0 min  Stress: Stress Concern Present (09/26/2018)    [place]    Feeling of Stress : To some extent  Social Connections: Not on file  Intimate Partner Violence: Not on file    Physical Exam      Future Appointments  Date Time Provider [place]  11/23/2021  3:15 PM [facility] None  01/24/2022 11:00 AM [facility] PA/NP [facility] None  01/30/2022  2:30 PM Ruben Haw, MD [facility]  05/17/2022  2:00 PM Ruben Pain, MD [facility]     ACTION: Home visit completed

## 2021-11-17 NOTE — Patient Instructions (Signed)
Description   Take 1.5 tablets today and 1.5 tablets tomorrow and then START taking 1 tablet (1m) daily EXCEPT 0.5 tablet (2.560m on Sundays and Thursdays.   Stay consistent with greens (1-2 times per week)  Recheck INR in 1 week  [facility] [phone]

## 2021-11-22 ENCOUNTER — Ambulatory Visit: Payer: Medicare PPO

## 2021-11-22 ENCOUNTER — Telehealth (HOSPITAL_COMMUNITY): Payer: Self-pay

## 2021-11-22 NOTE — Telephone Encounter (Signed)
Called Ruben Murray to confirm his appointment today for his coumadin check- he said he forgot about today's visit. I called the coumadin clinic and got his appointment moved to Monday at 330. Called him back to make him aware. RN at coumadin clinic stated to keep warfarin the same until his visit. He agreed with plan. Call complete.   [name], [place] 11/22/2021

## 2021-11-23 ENCOUNTER — Other Ambulatory Visit (HOSPITAL_COMMUNITY): Payer: Self-pay

## 2021-11-23 NOTE — Progress Notes (Signed)
Paramedicine Encounter    Patient ID: Ruben Murray, male    DOB: 12-13-1945, 76 y.o.   MRN: [id number]   Arrived for home visit for [name] who reports to be feeling okay but says he has gained some weight over the last few days and has become more short of breath. He weight this morning and is 4lbs up from yesterdays weight. His HH RN called to report same to his PCP who called me this morning to suggest taking an extra Torsemide. He will take an extra 76m today and tomorrow. He weighs daily and knows to report same to me. I will follow up.   I obtained vitals and assessment as noted. Some lower left leg swelling, not pitting. Lung sounds clear. I reviewed meds and confirmed same filling pill box for one week.   [facility], [facility] I talked to caregiver FDeveron Furlongand [place] confirmed they will pick up today. From now on I will pick up medications when called in due to [name] medications. Mr. BPietilaagreed on same.   Meds to be picked up today- Buspar Jardiance Digoxin Multivitamin  We reviewed HF education including diet, sodium and fluid intake. He was agreeable to plan and reports he will do better.   Appointments reviewed and confirmed.  Home visit complete.   HSalena Saner [place]9/20/2023    Patient Care Team: HCaren Macadam MD as PCP - General (Family Medicine) SJerline Pain MD as PCP - Cardiology (Cardiology) CConstance Haw MD as PCP - Electrophysiology (Cardiology) HCaren Macadam MD (Family Medicine) [place] [facility] as Case Manager ([facility])  Patient Active Problem List   Diagnosis Date Noted   Atrial fibrillation ([facility] 11/02/2021   Long term (current) use of anticoagulants 11/02/2021   AKI (acute kidney injury) ([place] 07/27/2021   Acute pulmonary embolism ([place] 07/26/2021   Acute on chronic systolic CHF  (congestive heart failure) ([place] 05/17/2021   Hypercoagulable state ([place] 05/10/2021   Left leg pain 04/24/2021   OSA on CPAP 04/24/2021   Mitral regurgitation 04/24/2021   Tricuspid regurgitation 04/24/2021   Dilated aortic root ([place] 04/24/2021   Permanent atrial fibrillation ([place] 04/19/2021   Chronic arthropathy 04/13/2020   Callus 04/13/2020   Onychomycosis 12/12/2019   Left leg cellulitis 157/32/2025  Chronic systolic heart failure ([place] 12/09/2019   Dilated cardiomyopathy (HA[place]    Atrial flutter ([place] 11/03/2019   Atrial flutter with rapid ventricular response ([place] 11/03/2019   Acquired thrombophilia ([place] 01/15/2019   Atrial fibrillation with rapid ventricular response ([place] 10/29/2017   Chronic combined systolic and diastolic heart failure ([place] 10/29/2017   Prolonged QT interval 10/29/2017   Sensorineural hearing loss (SNHL) of both ears 01/31/2017   Dysphonia 10/03/2016   Laryngopharyngeal reflux (LPR) 10/03/2016   Nasal polyps 10/03/2016   Rhinitis medicamentosa 10/03/2016   Chronic laryngitis 10/03/2016   Hoarseness 08/31/2016   Moderate persistent asthma 08/02/2016   COPD (chronic obstructive pulmonary disease) ([place] 08/02/2016   Morbid (severe) obesity due to excess calories ([place] 08/02/2016   Community acquired pneumonia 03/06/2016   Essential hypertension 03/06/2016   Mixed hyperlipidemia 03/06/2016   Right thyroid nodule 03/06/2016   Seizures ([place] 03/06/2016   Fever 03/12/2015   Recurrent erosion of cornea 06/23/2011    Current Outpatient Medications:    acetaminophen (TYLENOL) 500 MG tablet, Take 1,000 mg by mouth at bedtime as needed  for mild pain or headache., Disp: , Rfl:    busPIRone (BUSPAR) 5 MG tablet, Take 10 mg by mouth daily., Disp: , Rfl:    Carboxymethylcellulose Sod PF 0.5 % SOLN, Place 1 drop into both eyes 4 (four) times daily as needed (dry eyes)., Disp: , Rfl:    Cholecalciferol (VITAMIN D3) 25 MCG (1000 UT) CAPS, Take 1,000 Units by  mouth daily., Disp: , Rfl:    digoxin (LANOXIN) 0.125 MG tablet, Take 1 tablet (0.125 mg total) by mouth daily., Disp: 30 tablet, Rfl: 3   escitalopram (LEXAPRO) 20 MG tablet, Take 20 mg by mouth daily. Takes at bedtime., Disp: , Rfl:    finasteride (PROSCAR) 5 MG tablet, Take 5 mg by mouth daily at 12 noon. , Disp: , Rfl:    fluticasone (FLONASE) 50 MCG/ACT nasal spray, Place 1 spray into both nostrils 2 (two) times daily., Disp: , Rfl:    JARDIANCE 10 MG TABS tablet, TAKE 1 TABLET BY MOUTH DAILY BEFORE BREAKFAST., Disp: 90 tablet, Rfl: 3   loratadine (CLARITIN) 10 MG tablet, Take 10 mg by mouth daily., Disp: , Rfl:    losartan (COZAAR) 25 MG tablet, TAKE 1/2 TABLET BY MOUTH EVERY DAY, Disp: 45 tablet, Rfl: 3   metoprolol succinate (TOPROL-XL) 100 MG 24 hr tablet, Take 1 tablet (100 mg total) by mouth 2 (two) times daily. Take with or immediately following a meal., Disp: 180 tablet, Rfl: 3   Multiple Vitamin (MULTIVITAMIN WITH MINERALS) TABS tablet, Take 1 tablet by mouth daily at 12 noon., Disp: , Rfl:    PHENobarbital (LUMINAL) 97.2 MG tablet, Take 97.2 mg by mouth daily. Takes between 3:00-5:00pm., Disp: , Rfl:    potassium chloride SA (KLOR-CON M) 20 MEQ tablet, Take 1 tablet (20 mEq total) by mouth daily., Disp: 90 tablet, Rfl: 3   pravastatin (PRAVACHOL) 40 MG tablet, Take 40 mg by mouth at bedtime. , Disp: , Rfl:    spironolactone (ALDACTONE) 25 MG tablet, Take 0.5 tablets (12.5 mg total) by mouth daily., Disp: 45 tablet, Rfl: 2   torsemide 60 MG TABS, Take 60 mg by mouth daily., Disp: 30 tablet, Rfl: 1   triamcinolone cream (KENALOG) 0.1 %, Apply 1 application  topically 2 (two) times daily as needed (rash)., Disp: , Rfl:    warfarin (COUMADIN) 1 MG tablet, Take 6 mg by mouth daily., Disp: , Rfl:  Allergies  Allergen Reactions   Dilaudid [Hydromorphone Hcl] Other (See Comments)    Makes pt hyper    Hydromorphone Other (See Comments)    hyperactiviity Other reaction(s): made him  wild Other reaction(s): Unknown   Morphine Sulfate     Other reaction(s): at high dose causes confusion Other reaction(s): at high dose causes confusion Other reaction(s): at high dose causes confusion, Other (See Comments) Other reaction(s): at high dose causes confusion Other reaction(s): at high dose causes confusion    Tegretol [Carbamazepine] Hives   Carbamazepine Hives, Rash and Other (See Comments)    Other reaction(s): hives Other reaction(s): Hives Other reaction(s): hives     Social History   Socioeconomic History   Marital status: Married    Spouse name: Not on file   Number of children: Not on file   Years of education: Not on file   Highest education level: Not on file  Occupational History   Not on file  Tobacco Use   Smoking status: Former    Packs/day: 3.00    Years: 48.00    Total pack years:  144.00    Types: Cigarettes    Quit date: 2000    Years since quitting: 23.7   Smokeless tobacco: Never  Vaping Use   Vaping Use: Never used  Substance and Sexual Activity   Alcohol use: Never   Drug use: Never   Sexual activity: Never  Other Topics Concern   Not on file  Social History Narrative   ** Merged History Encounter **       Social Determinants of Health   Financial Resource Strain: Medium Risk (10/26/2021)   Overall Financial Resource Strain (CARDIA)    Difficulty of Paying Living Expenses: Somewhat hard  Food Insecurity: No Food Insecurity (10/26/2021)   Hunger Vital Sign    Worried About Running Out of Food in the Last Year: Never true    Ran Out of Food in the Last Year: Never true  Transportation Needs: Unmet Transportation Needs (10/26/2021)   PRAPARE - Hydrologist (Medical): Yes    Lack of Transportation (Non-Medical): No  Physical Activity: Inactive (12/15/2019)   Exercise Vital Sign    Days of Exercise per Week: 0 days    Minutes of Exercise per Session: 0 min  Stress: Stress Concern Present (09/26/2018)    [place]    Feeling of Stress : To some extent  Social Connections: Not on file  Intimate Partner Violence: Not on file    Physical Exam      Future Appointments  Date Time Provider [place]  11/28/2021  3:30 PM [facility] None  01/24/2022 11:00 AM [facility] PA/NP [facility] None  01/30/2022  2:30 PM [name], MD [facility]  05/17/2022  2:00 PM [name], MD [facility]     ACTION: Home visit completed

## 2021-11-28 ENCOUNTER — Ambulatory Visit: Payer: Medicare PPO | Attending: Cardiology

## 2021-11-28 DIAGNOSIS — I4891 Unspecified atrial fibrillation: Secondary | ICD-10-CM | POA: Diagnosis not present

## 2021-11-28 DIAGNOSIS — Z7901 Long term (current) use of anticoagulants: Secondary | ICD-10-CM | POA: Diagnosis not present

## 2021-11-28 LAB — POCT INR: INR: 1.6 — AB (ref 2.0–3.0)

## 2021-11-28 NOTE — Patient Instructions (Signed)
Description   Take 2 tablets today and then START taking 1 tablet (30m) daily. Stay consistent with greens (1-2 times per week)  Recheck INR in 1 week.  [facility] [phone]

## 2021-11-30 ENCOUNTER — Other Ambulatory Visit (HOSPITAL_COMMUNITY): Payer: Self-pay

## 2021-11-30 NOTE — Progress Notes (Signed)
Paramedicine Encounter    Patient ID: Ruben Murray, male    DOB: 07/03/1945, 76 y.o.   MRN: [id number]   Arrived for home visit for [name] who reports he is doing well. Med rec visit only today.   Today I verified meds and confirmed same filling pill box for one week. We confirmed appointments and I plan to see him in one week. Visit complete.   Refills: Digoxin    [name], [place] 11/30/2021     ACTION: Home visit completed

## 2021-12-05 ENCOUNTER — Ambulatory Visit: Payer: Medicare PPO | Attending: Cardiology | Admitting: *Deleted

## 2021-12-05 DIAGNOSIS — I4891 Unspecified atrial fibrillation: Secondary | ICD-10-CM

## 2021-12-05 DIAGNOSIS — I2699 Other pulmonary embolism without acute cor pulmonale: Secondary | ICD-10-CM | POA: Diagnosis not present

## 2021-12-05 DIAGNOSIS — Z7901 Long term (current) use of anticoagulants: Secondary | ICD-10-CM

## 2021-12-05 LAB — POCT INR: INR: 1.6 — AB (ref 2.0–3.0)

## 2021-12-05 NOTE — Patient Instructions (Addendum)
Description   Take 2 tablets today and then START taking 1 tablet (46m) daily except 2 tablets on Saturdays. Stay consistent with greens (1-2 times per week).  Recheck INR in 1 week. [facility] [phone]

## 2021-12-07 ENCOUNTER — Telehealth (HOSPITAL_COMMUNITY): Payer: Self-pay

## 2021-12-07 ENCOUNTER — Telehealth: Payer: Self-pay | Admitting: Cardiology

## 2021-12-07 ENCOUNTER — Other Ambulatory Visit (HOSPITAL_COMMUNITY): Payer: Self-pay

## 2021-12-07 NOTE — Telephone Encounter (Signed)
Saw a note in Ruben Murray chart after seeing him today in the home that someone from [place] saw him and contacted Dr. [name] office about a weight gain and his Torsemide dose was increased.   I spoke to Ruben Murray directly about this and he reports he feels like he does not need to take any extra Torsemide as he says, "he feels fine".   I see Ruben Murray weekly and note his weight has been fluctuating however he is at a stable weight and at his baseline today and he has no increased lower leg edema on my assessment more than his normal. He has not been short of breath more than normal and has complained of no chest pain or difficulty breathing. His vitals were within normal limits and he reports he has been peeing frequently.   I manage his pill box weekly during our home visits and assist with HF management and education with him weekly.   I will forward this to Dr. [name] RN and to Dr. Clayborne Dana triage team for them to assess dose increase as Dr. Haroldine Laws at [facility] manages his torsemide. I will also reach out to [place] with [facility] to make her aware of his baseline.   I will follow up in the home next week at our scheduled visit on Wednesday.   [name], [place] 12/07/2021

## 2021-12-07 NOTE — Telephone Encounter (Signed)
Vicente Males, Palliative Care nurse with [facility] returned call regarding patient's increase weight today. Reviewed in instructions for torsemide 43m for 3 days then resume torsemide 646m  AnVicente Malestates she will follow up on his weight 12/12/2021.

## 2021-12-07 NOTE — Telephone Encounter (Signed)
Pt c/o swelling: STAT is pt has developed SOB within 24 hours If swelling, where is the swelling located? Trace edema to ankles and feet, nothing significant   How much weight have you gained and in what time span? 5lbs in 3 days  Have you gained 3 pounds in a day or 5 pounds in a week? 5lbs in 3 days  Do you have a log of your daily weights (if so, list)?  225.2 - 12/04/21  230.8 - 12/07/21 Are you currently taking a fluid pill? yes  Are you currently SOB? No   Have you traveled recently? No

## 2021-12-07 NOTE — Telephone Encounter (Addendum)
Left message for palliative nurse, Ruben Murray to return call.  Spoke with patient who reports he has gained 5lbs over last 3 days. He states he is no more SOB than usual. He has a small amount of swelling in lower extremity. He denies any changes in diet or eating salty foods. He states he weighs every morning before getting dressed.  Reviewed with Dr.Klein (DOD) and received orders for patient to take torsemide 90 mg (1.5 tablets) times 3 days then resume torsemide 60 mg daily. Report back to office if weight does not return to baseline.   Patient asked for [place] to contact Ruben Murray, his caregiver who manages his medications. Spoke with [name] and advised medication changes noted above and she verbalized understanding.

## 2021-12-07 NOTE — Progress Notes (Signed)
Paramedicine Encounter    Patient ID: Ruben Murray, male    DOB: 06/08/1945, 76 y.o.   MRN: [id number]   Arrived for home visit for Ruben Murray who reports to be feeling good today. He denied any shortness of breath, dizziness, chest pain or swelling/weight gain. Assessment noted clear lung sounds, no lower leg swelling. No significant weight gain over the last week.   WT- 230lbs BP- 110/60 HR- 78 O2- 96% RR-16  LS- clear    I reviewed all meds and confirmed same filling pill box for one week.  Needs  digoxin Sun- Friday morning  Multivitamin- every morning   HH with [facility] came in for home visit and said they will be discharging him in two weeks from their office.   [name], [place] 12/07/2021    Patient Care Team: [name], MD as PCP - General (Family Medicine) [name], MD as PCP - Cardiology (Cardiology) [name], MD as PCP - Electrophysiology (Cardiology) [name], MD (Family Medicine) [place], [place] as Case Manager [facility] and Palliative Medicine)  Patient Active Problem List   Diagnosis Date Noted   Atrial fibrillation [facility]) 11/02/2021   Long term (current) use of anticoagulants 11/02/2021   AKI (acute kidney injury) ([place]) 07/27/2021   Acute pulmonary embolism ([place]) 07/26/2021   Acute on chronic systolic CHF (congestive heart failure) ([place]) 05/17/2021   Hypercoagulable state ([place]) 05/10/2021   Left leg pain 04/24/2021   OSA on CPAP 04/24/2021   Mitral regurgitation 04/24/2021   Tricuspid regurgitation 04/24/2021   Dilated aortic root ([place]) 04/24/2021   Permanent atrial fibrillation ([place]) 04/19/2021   Chronic arthropathy 04/13/2020   Callus 04/13/2020   Onychomycosis 12/12/2019   Left leg cellulitis 53/66/4403   Chronic systolic heart failure ([place]) 12/09/2019   Dilated cardiomyopathy ([place])    Atrial flutter ([place]) 11/03/2019   Atrial flutter with rapid ventricular response ([place]) 11/03/2019    Acquired thrombophilia ([place]) 01/15/2019   Atrial fibrillation with rapid ventricular response ([place]) 10/29/2017   Chronic combined systolic and diastolic heart failure ([place]) 10/29/2017   Prolonged QT interval 10/29/2017   Sensorineural hearing loss (SNHL) of both ears 01/31/2017   Dysphonia 10/03/2016   Laryngopharyngeal reflux (LPR) 10/03/2016   Nasal polyps 10/03/2016   Rhinitis medicamentosa 10/03/2016   Chronic laryngitis 10/03/2016   Hoarseness 08/31/2016   Moderate persistent asthma 08/02/2016   COPD (chronic obstructive pulmonary disease) ([place]) 08/02/2016   Morbid (severe) obesity due to excess calories ([place]) 08/02/2016   Community acquired pneumonia 03/06/2016   Essential hypertension 03/06/2016   Mixed hyperlipidemia 03/06/2016   Right thyroid nodule 03/06/2016   Seizures ([place]) 03/06/2016   Fever 03/12/2015   Recurrent erosion of cornea 06/23/2011    Current Outpatient Medications:    acetaminophen (TYLENOL) 500 MG tablet, Take 1,000 mg by mouth at bedtime as needed for mild pain or headache., Disp: , Rfl:    busPIRone (BUSPAR) 5 MG tablet, Take 10 mg by mouth daily., Disp: , Rfl:    Carboxymethylcellulose Sod PF 0.5 % SOLN, Place 1 drop into both eyes 4 (four) times daily as needed (dry eyes)., Disp: , Rfl:    Cholecalciferol (VITAMIN D3) 25 MCG (1000 UT) CAPS, Take 1,000 Units by mouth daily., Disp: , Rfl:    digoxin (LANOXIN) 0.125 MG tablet, Take 1 tablet (0.125 mg total) by mouth daily., Disp: 30 tablet, Rfl: 3   escitalopram (LEXAPRO) 20 MG tablet, Take 20 mg by mouth daily. Takes at bedtime., Disp: ,  Rfl:    finasteride (PROSCAR) 5 MG tablet, Take 5 mg by mouth daily at 12 noon. , Disp: , Rfl:    fluticasone (FLONASE) 50 MCG/ACT nasal spray, Place 1 spray into both nostrils 2 (two) times daily., Disp: , Rfl:    JARDIANCE 10 MG TABS tablet, TAKE 1 TABLET BY MOUTH DAILY BEFORE BREAKFAST., Disp: 90 tablet, Rfl: 3   loratadine (CLARITIN) 10 MG tablet, Take 10 mg by  mouth daily., Disp: , Rfl:    losartan (COZAAR) 25 MG tablet, TAKE 1/2 TABLET BY MOUTH EVERY DAY, Disp: 45 tablet, Rfl: 3   metoprolol succinate (TOPROL-XL) 100 MG 24 hr tablet, Take 1 tablet (100 mg total) by mouth 2 (two) times daily. Take with or immediately following a meal., Disp: 180 tablet, Rfl: 3   Multiple Vitamin (MULTIVITAMIN WITH MINERALS) TABS tablet, Take 1 tablet by mouth daily at 12 noon., Disp: , Rfl:    PHENobarbital (LUMINAL) 97.2 MG tablet, Take 97.2 mg by mouth daily. Takes between 3:00-5:00pm., Disp: , Rfl:    potassium chloride SA (KLOR-CON M) 20 MEQ tablet, Take 1 tablet (20 mEq total) by mouth daily., Disp: 90 tablet, Rfl: 3   pravastatin (PRAVACHOL) 40 MG tablet, Take 40 mg by mouth at bedtime. , Disp: , Rfl:    spironolactone (ALDACTONE) 25 MG tablet, Take 0.5 tablets (12.5 mg total) by mouth daily., Disp: 45 tablet, Rfl: 2   torsemide 60 MG TABS, Take 60 mg by mouth daily., Disp: 30 tablet, Rfl: 1   triamcinolone cream (KENALOG) 0.1 %, Apply 1 application  topically 2 (two) times daily as needed (rash)., Disp: , Rfl:    warfarin (COUMADIN) 1 MG tablet, Take 6 mg by mouth daily., Disp: , Rfl:  Allergies  Allergen Reactions   Dilaudid [Hydromorphone Hcl] Other (See Comments)    Makes pt hyper    Hydromorphone Other (See Comments)    hyperactiviity Other reaction(s): made him wild Other reaction(s): Unknown   Morphine Sulfate     Other reaction(s): at high dose causes confusion Other reaction(s): at high dose causes confusion Other reaction(s): at high dose causes confusion, Other (See Comments) Other reaction(s): at high dose causes confusion Other reaction(s): at high dose causes confusion    Tegretol [Carbamazepine] Hives   Carbamazepine Hives, Rash and Other (See Comments)    Other reaction(s): hives Other reaction(s): Hives Other reaction(s): hives     Social History   Socioeconomic History   Marital status: Married    Spouse name: Not on file    Number of children: Not on file   Years of education: Not on file   Highest education level: Not on file  Occupational History   Not on file  Tobacco Use   Smoking status: Former    Packs/day: 3.00    Years: 48.00    Total pack years: 144.00    Types: Cigarettes    Quit date: 2000    Years since quitting: 23.7   Smokeless tobacco: Never  Vaping Use   Vaping Use: Never used  Substance and Sexual Activity   Alcohol use: Never   Drug use: Never   Sexual activity: Never  Other Topics Concern   Not on file  Social History Narrative   ** Merged History Encounter **       Social Determinants of Health   Financial Resource Strain: Medium Risk (10/26/2021)   Overall Financial Resource Strain (CARDIA)    Difficulty of Paying Living Expenses: Somewhat hard  Food Insecurity:  No Food Insecurity (10/26/2021)   Hunger Vital Sign    Worried About Running Out of Food in the Last Year: Never true    Ran Out of Food in the Last Year: Never true  Transportation Needs: Unmet Transportation Needs (10/26/2021)   PRAPARE - Hydrologist (Medical): Yes    Lack of Transportation (Non-Medical): No  Physical Activity: Inactive (12/15/2019)   Exercise Vital Sign    Days of Exercise per Week: 0 days    Minutes of Exercise per Session: 0 min  Stress: Stress Concern Present (09/26/2018)   [place]    Feeling of Stress : To some extent  Social Connections: Not on file  Intimate Partner Violence: Not on file    Physical Exam      Future Appointments  Date Time Provider [place]  12/12/2021  1:45 PM [facility] None  01/24/2022 11:00 AM [facility] PA/NP [facility] None  01/30/2022  2:30 PM [name], MD [facility]  05/17/2022  2:00 PM [name], MD [facility]     ACTION: Home visit completed

## 2021-12-12 ENCOUNTER — Ambulatory Visit: Payer: Medicare PPO | Attending: Cardiology

## 2021-12-12 DIAGNOSIS — Z5181 Encounter for therapeutic drug level monitoring: Secondary | ICD-10-CM

## 2021-12-12 DIAGNOSIS — Z7901 Long term (current) use of anticoagulants: Secondary | ICD-10-CM | POA: Diagnosis not present

## 2021-12-12 DIAGNOSIS — I4891 Unspecified atrial fibrillation: Secondary | ICD-10-CM | POA: Diagnosis not present

## 2021-12-12 LAB — POCT INR: INR: 2.7 (ref 2.0–3.0)

## 2021-12-12 NOTE — Patient Instructions (Signed)
Continue taking 1 tablet (14m) daily except 2 tablets on Saturdays. Stay consistent with greens (1-2 times per week).  Recheck INR in 4 weeks. [facility] [phone]

## 2021-12-14 ENCOUNTER — Other Ambulatory Visit (HOSPITAL_COMMUNITY): Payer: Self-pay

## 2021-12-14 ENCOUNTER — Other Ambulatory Visit (HOSPITAL_COMMUNITY): Payer: Self-pay | Admitting: Internal Medicine

## 2021-12-14 DIAGNOSIS — I5020 Unspecified systolic (congestive) heart failure: Secondary | ICD-10-CM

## 2021-12-14 MED ORDER — DIGOXIN 125 MCG PO TABS: 0.1250 mg | ORAL_TABLET | Freq: Every day | ORAL | 11 refills | Status: DC

## 2021-12-14 NOTE — Progress Notes (Signed)
Paramedicine Encounter    Patient ID: Ruben Murray, male    DOB: 01-17-1946, 76 y.o.   MRN: [id number]  Arrived for home visit for Ruben Murray who was seated in his recliner alert and oriented. Ruben Murray friend and care taker [name] informed me that Ruben Murray and home was positive for bed bugs again. They are actively trying to find solutions to help with this but wanted to make me aware.   Ruben Murray denied any pain, shortness of breath more than normal with exertion, swelling, or weight gain. He reports no dizziness or near fainting episodes and denied palpatations.   On exam, no lower leg swelling. Lungs clear. Vitals as noted:  WT- 225lbs BP- 122/80 HR- 55 RR- 16 O2- 95%  Lungs- clear Swelling- NONE   I reviewed meds and pill box had a few missed doses in it. He did not pick up Digoxin as instructed and [facility] reports they never received refills from provider. I called HF triage and requested refills to be sent in today- they were and I instructed care giver and patient to pick up asap to place in pill box. They agreed with plan.   I filled pill box for one week accordingly. Minus digoxin to be placed once picked up. Refills to be picked up also as noted: Buspar Potassium  Pravastatin OTC Multivitamin  We reviewed upcoming visits and appointments. I wrote same down on calendar for Ruben Murray reminders.   We discussed diet and med compliance reminding him of his fluid restrictions and salt intake. He verbalized agreement.   Home visit complete. I will follow up in the home in one week.   [name], [place] 12/14/2021     Patient Care Team: [name], MD as PCP - General (Family Medicine) [name], MD as PCP - Cardiology (Cardiology) [name], MD as PCP - Electrophysiology (Cardiology) [name], MD (Family Medicine) [place], [place] as Case Manager [facility] and Palliative Medicine)  Patient Active Problem  List   Diagnosis Date Noted   Atrial fibrillation [facility]) 11/02/2021   Long term (current) use of anticoagulants 11/02/2021   AKI (acute kidney injury) ([place]) 07/27/2021   Acute pulmonary embolism ([place]) 07/26/2021   Acute on chronic systolic CHF (congestive heart failure) ([place]) 05/17/2021   Hypercoagulable state ([place]) 05/10/2021   Left leg pain 04/24/2021   OSA on CPAP 04/24/2021   Mitral regurgitation 04/24/2021   Tricuspid regurgitation 04/24/2021   Dilated aortic root ([place]) 04/24/2021   Permanent atrial fibrillation ([place]) 04/19/2021   Chronic arthropathy 04/13/2020   Callus 04/13/2020   Onychomycosis 12/12/2019   Left leg cellulitis 84/16/6063   Chronic systolic heart failure ([place]) 12/09/2019   Dilated cardiomyopathy ([place])    Atrial flutter ([place]) 11/03/2019   Atrial flutter with rapid ventricular response ([place]) 11/03/2019   Acquired thrombophilia ([place]) 01/15/2019   Atrial fibrillation with rapid ventricular response ([place]) 10/29/2017   Chronic combined systolic and diastolic heart failure ([place]) 10/29/2017   Prolonged QT interval 10/29/2017   Sensorineural hearing loss (SNHL) of both ears 01/31/2017   Dysphonia 10/03/2016   Laryngopharyngeal reflux (LPR) 10/03/2016   Nasal polyps 10/03/2016   Rhinitis medicamentosa 10/03/2016   Chronic laryngitis 10/03/2016   Hoarseness 08/31/2016   Moderate persistent asthma 08/02/2016   COPD (chronic obstructive pulmonary disease) ([place]) 08/02/2016   Morbid (severe) obesity due to excess calories ([place]) 08/02/2016   Community acquired pneumonia 03/06/2016   Essential hypertension 03/06/2016   Mixed hyperlipidemia 03/06/2016  Right thyroid nodule 03/06/2016   Seizures ([place]) 03/06/2016   Fever 03/12/2015   Recurrent erosion of cornea 06/23/2011    Current Outpatient Medications:    acetaminophen (TYLENOL) 500 MG tablet, Take 1,000 mg by mouth at bedtime as needed for mild pain or headache., Disp: , Rfl:    busPIRone (BUSPAR) 5 MG tablet,  Take 10 mg by mouth daily., Disp: , Rfl:    Carboxymethylcellulose Sod PF 0.5 % SOLN, Place 1 drop into both eyes 4 (four) times daily as needed (dry eyes)., Disp: , Rfl:    Cholecalciferol (VITAMIN D3) 25 MCG (1000 UT) CAPS, Take 1,000 Units by mouth daily., Disp: , Rfl:    digoxin (LANOXIN) 0.125 MG tablet, Take 1 tablet (0.125 mg total) by mouth daily., Disp: 30 tablet, Rfl: 3   escitalopram (LEXAPRO) 20 MG tablet, Take 20 mg by mouth daily. Takes at bedtime., Disp: , Rfl:    finasteride (PROSCAR) 5 MG tablet, Take 5 mg by mouth daily at 12 noon. , Disp: , Rfl:    fluticasone (FLONASE) 50 MCG/ACT nasal spray, Place 1 spray into both nostrils 2 (two) times daily., Disp: , Rfl:    JARDIANCE 10 MG TABS tablet, TAKE 1 TABLET BY MOUTH DAILY BEFORE BREAKFAST., Disp: 90 tablet, Rfl: 3   loratadine (CLARITIN) 10 MG tablet, Take 10 mg by mouth daily., Disp: , Rfl:    losartan (COZAAR) 25 MG tablet, TAKE 1/2 TABLET BY MOUTH EVERY DAY, Disp: 45 tablet, Rfl: 3   metoprolol succinate (TOPROL-XL) 100 MG 24 hr tablet, Take 1 tablet (100 mg total) by mouth 2 (two) times daily. Take with or immediately following a meal., Disp: 180 tablet, Rfl: 3   Multiple Vitamin (MULTIVITAMIN WITH MINERALS) TABS tablet, Take 1 tablet by mouth daily at 12 noon., Disp: , Rfl:    PHENobarbital (LUMINAL) 97.2 MG tablet, Take 97.2 mg by mouth daily. Takes between 3:00-5:00pm., Disp: , Rfl:    potassium chloride SA (KLOR-CON M) 20 MEQ tablet, Take 1 tablet (20 mEq total) by mouth daily., Disp: 90 tablet, Rfl: 3   pravastatin (PRAVACHOL) 40 MG tablet, Take 40 mg by mouth at bedtime. , Disp: , Rfl:    spironolactone (ALDACTONE) 25 MG tablet, Take 0.5 tablets (12.5 mg total) by mouth daily., Disp: 45 tablet, Rfl: 2   torsemide 60 MG TABS, Take 60 mg by mouth daily., Disp: 30 tablet, Rfl: 1   triamcinolone cream (KENALOG) 0.1 %, Apply 1 application  topically 2 (two) times daily as needed (rash)., Disp: , Rfl:    warfarin (COUMADIN) 1  MG tablet, Take 6 mg by mouth daily., Disp: , Rfl:  Allergies  Allergen Reactions   Dilaudid [Hydromorphone Hcl] Other (See Comments)    Makes pt hyper    Hydromorphone Other (See Comments)    hyperactiviity Other reaction(s): made him wild Other reaction(s): Unknown   Morphine Sulfate     Other reaction(s): at high dose causes confusion Other reaction(s): at high dose causes confusion Other reaction(s): at high dose causes confusion, Other (See Comments) Other reaction(s): at high dose causes confusion Other reaction(s): at high dose causes confusion    Tegretol [Carbamazepine] Hives   Carbamazepine Hives, Rash and Other (See Comments)    Other reaction(s): hives Other reaction(s): Hives Other reaction(s): hives     Social History   Socioeconomic History   Marital status: Married    Spouse name: Not on file   Number of children: Not on file   Years of education:  Not on file   Highest education level: Not on file  Occupational History   Not on file  Tobacco Use   Smoking status: Former    Packs/day: 3.00    Years: 48.00    Total pack years: 144.00    Types: Cigarettes    Quit date: 2000    Years since quitting: 23.7   Smokeless tobacco: Never  Vaping Use   Vaping Use: Never used  Substance and Sexual Activity   Alcohol use: Never   Drug use: Never   Sexual activity: Never  Other Topics Concern   Not on file  Social History Narrative   ** Merged History Encounter **       Social Determinants of Health   Financial Resource Strain: Medium Risk (10/26/2021)   Overall Financial Resource Strain (CARDIA)    Difficulty of Paying Living Expenses: Somewhat hard  Food Insecurity: No Food Insecurity (10/26/2021)   Hunger Vital Sign    Worried About Running Out of Food in the Last Year: Never true    Ran Out of Food in the Last Year: Never true  Transportation Needs: Unmet Transportation Needs (10/26/2021)   PRAPARE - Hydrologist  (Medical): Yes    Lack of Transportation (Non-Medical): No  Physical Activity: Inactive (12/15/2019)   Exercise Vital Sign    Days of Exercise per Week: 0 days    Minutes of Exercise per Session: 0 min  Stress: Stress Concern Present (09/26/2018)   [place]    Feeling of Stress : To some extent  Social Connections: Not on file  Intimate Partner Violence: Not on file    Physical Exam      Future Appointments  Date Time Provider [place]  01/09/2022  1:45 PM [facility] None  01/24/2022 11:00 AM [facility] PA/NP [facility] None  01/30/2022  2:30 PM [name], MD [facility]  05/17/2022  2:00 PM [name], MD [facility]     ACTION: Home visit completed

## 2021-12-14 NOTE — Telephone Encounter (Signed)
Meds ordered this encounter  Medications   digoxin (LANOXIN) 0.125 MG tablet    Sig: Take 1 tablet (0.125 mg total) by mouth daily.    Dispense:  30 tablet    Refill:  11

## 2021-12-20 DIAGNOSIS — E7800 Pure hypercholesterolemia, unspecified: Secondary | ICD-10-CM | POA: Diagnosis not present

## 2021-12-20 DIAGNOSIS — F419 Anxiety disorder, unspecified: Secondary | ICD-10-CM | POA: Diagnosis not present

## 2021-12-20 DIAGNOSIS — I4811 Longstanding persistent atrial fibrillation: Secondary | ICD-10-CM | POA: Diagnosis not present

## 2021-12-20 DIAGNOSIS — F324 Major depressive disorder, single episode, in partial remission: Secondary | ICD-10-CM | POA: Diagnosis not present

## 2021-12-20 DIAGNOSIS — E118 Type 2 diabetes mellitus with unspecified complications: Secondary | ICD-10-CM | POA: Diagnosis not present

## 2021-12-20 DIAGNOSIS — G40909 Epilepsy, unspecified, not intractable, without status epilepticus: Secondary | ICD-10-CM | POA: Diagnosis not present

## 2021-12-20 DIAGNOSIS — Z09 Encounter for follow-up examination after completed treatment for conditions other than malignant neoplasm: Secondary | ICD-10-CM | POA: Diagnosis not present

## 2021-12-21 ENCOUNTER — Other Ambulatory Visit (HOSPITAL_COMMUNITY): Payer: Self-pay

## 2021-12-21 DIAGNOSIS — I5020 Unspecified systolic (congestive) heart failure: Secondary | ICD-10-CM

## 2021-12-21 MED ORDER — POTASSIUM CHLORIDE CRYS ER 20 MEQ PO TBCR: 20.0000 meq | EXTENDED_RELEASE_TABLET | Freq: Every day | ORAL | 3 refills | Status: DC

## 2021-12-21 NOTE — Progress Notes (Signed)
Paramedicine Encounter    Patient ID: Ruben Murray, male    DOB: [birth year], 76 y.o.   MRN: [id number]  Arrived for home visit for Ruben Murray where he was alert and oriented reporting to be feeling okay just tired from helping take care of his wife. He reports that he continues to get easily short of breath when over exerting himself- this is normal for him. Today his weight is up 3 lbs from yesterday however he missed his medications yesterday and I reminded him the importance of diet and medication compliance. He verbalized understanding. I gave him his morning meds for today during our visit. We also discussed low sodium and fluid restrictions. He agreed with same.   I reviewed meds and filled pill box for one week.   No refills needed.   Ruben Murray reports he saw PCP yesterday and that they are working on trying to place him and his wife into an assisted living facility. I will continue to follow up.   Currently he has [facility] coming out today who is going to be discharging him. He also has palliative care services checking on him every two weeks.   I plan to see him in one week. Appointments reviewed and HF education was discussed. He is aware to reach out if weight is increasing when he is compliant with meds and diet and shortness of breath does not improve.   PCP [facility] PCP RN Joellen Jersey and I spoke today at length about Mr. [name] situation and they have my information if needed.    Visit complete.   [name], [name] 12/21/2021      Patient Care Team: [name], MD as PCP - General (Family Medicine) [name], MD as PCP - Cardiology (Cardiology) [name], MD as PCP - Electrophysiology (Cardiology) [name], MD (Family Medicine) [place], [place] as Case Manager [facility] and Palliative Medicine)  Patient Active Problem List   Diagnosis Date Noted   Atrial fibrillation [facility]) 11/02/2021   Long term (current)  use of anticoagulants 11/02/2021   AKI (acute kidney injury) ([place]) 07/27/2021   Acute pulmonary embolism ([place]) 07/26/2021   Acute on chronic systolic CHF (congestive heart failure) ([place]) 05/17/2021   Hypercoagulable state ([place]) 05/10/2021   Left leg pain 04/24/2021   OSA on CPAP 04/24/2021   Mitral regurgitation 04/24/2021   Tricuspid regurgitation 04/24/2021   Dilated aortic root ([place]) 04/24/2021   Permanent atrial fibrillation ([place]) 04/19/2021   Chronic arthropathy 04/13/2020   Callus 04/13/2020   Onychomycosis 12/12/2019   Left leg cellulitis 66/59/9357   Chronic systolic heart failure ([place]) 12/09/2019   Dilated cardiomyopathy ([place])    Atrial flutter ([place]) 11/03/2019   Atrial flutter with rapid ventricular response ([place]) 11/03/2019   Acquired thrombophilia ([place]) 01/15/2019   Atrial fibrillation with rapid ventricular response ([place]) 10/29/2017   Chronic combined systolic and diastolic heart failure ([place]) 10/29/2017   Prolonged QT interval 10/29/2017   Sensorineural hearing loss (SNHL) of both ears 01/31/2017   Dysphonia 10/03/2016   Laryngopharyngeal reflux (LPR) 10/03/2016   Nasal polyps 10/03/2016   Rhinitis medicamentosa 10/03/2016   Chronic laryngitis 10/03/2016   Hoarseness 08/31/2016   Moderate persistent asthma 08/02/2016   COPD (chronic obstructive pulmonary disease) ([place]) 08/02/2016   Morbid (severe) obesity due to excess calories ([place]) 08/02/2016   Community acquired pneumonia 03/06/2016   Essential hypertension 03/06/2016   Mixed hyperlipidemia 03/06/2016   Right thyroid nodule 03/06/2016   Seizures ([place]) 03/06/2016  Fever 03/12/2015   Recurrent erosion of cornea 06/23/2011    Current Outpatient Medications:    acetaminophen (TYLENOL) 500 MG tablet, Take 1,000 mg by mouth at bedtime as needed for mild pain or headache., Disp: , Rfl:    busPIRone (BUSPAR) 5 MG tablet, Take 10 mg by mouth daily., Disp: , Rfl:    Carboxymethylcellulose Sod PF 0.5 % SOLN, Place  1 drop into both eyes 4 (four) times daily as needed (dry eyes)., Disp: , Rfl:    Cholecalciferol (VITAMIN D3) 25 MCG (1000 UT) CAPS, Take 1,000 Units by mouth daily., Disp: , Rfl:    digoxin (LANOXIN) 0.125 MG tablet, Take 1 tablet (0.125 mg total) by mouth daily., Disp: 30 tablet, Rfl: 11   escitalopram (LEXAPRO) 20 MG tablet, Take 20 mg by mouth daily. Takes at bedtime., Disp: , Rfl:    finasteride (PROSCAR) 5 MG tablet, Take 5 mg by mouth daily at 12 noon. , Disp: , Rfl:    fluticasone (FLONASE) 50 MCG/ACT nasal spray, Place 1 spray into both nostrils 2 (two) times daily., Disp: , Rfl:    JARDIANCE 10 MG TABS tablet, TAKE 1 TABLET BY MOUTH DAILY BEFORE BREAKFAST., Disp: 90 tablet, Rfl: 3   loratadine (CLARITIN) 10 MG tablet, Take 10 mg by mouth daily., Disp: , Rfl:    losartan (COZAAR) 25 MG tablet, TAKE 1/2 TABLET BY MOUTH EVERY DAY, Disp: 45 tablet, Rfl: 3   metoprolol succinate (TOPROL-XL) 100 MG 24 hr tablet, Take 1 tablet (100 mg total) by mouth 2 (two) times daily. Take with or immediately following a meal., Disp: 180 tablet, Rfl: 3   Multiple Vitamin (MULTIVITAMIN WITH MINERALS) TABS tablet, Take 1 tablet by mouth daily at 12 noon., Disp: , Rfl:    PHENobarbital (LUMINAL) 97.2 MG tablet, Take 97.2 mg by mouth daily. Takes between 3:00-5:00pm., Disp: , Rfl:    potassium chloride SA (KLOR-CON M) 20 MEQ tablet, Take 1 tablet (20 mEq total) by mouth daily., Disp: 90 tablet, Rfl: 3   pravastatin (PRAVACHOL) 40 MG tablet, Take 40 mg by mouth at bedtime. , Disp: , Rfl:    spironolactone (ALDACTONE) 25 MG tablet, Take 0.5 tablets (12.5 mg total) by mouth daily., Disp: 45 tablet, Rfl: 2   torsemide 60 MG TABS, Take 60 mg by mouth daily., Disp: 30 tablet, Rfl: 1   triamcinolone cream (KENALOG) 0.1 %, Apply 1 application  topically 2 (two) times daily as needed (rash)., Disp: , Rfl:    warfarin (COUMADIN) 1 MG tablet, Take 6 mg by mouth daily., Disp: , Rfl:  Allergies  Allergen Reactions    Dilaudid [Hydromorphone Hcl] Other (See Comments)    Makes pt hyper    Hydromorphone Other (See Comments)    hyperactiviity Other reaction(s): made him wild Other reaction(s): Unknown   Morphine Sulfate     Other reaction(s): at high dose causes confusion Other reaction(s): at high dose causes confusion Other reaction(s): at high dose causes confusion, Other (See Comments) Other reaction(s): at high dose causes confusion Other reaction(s): at high dose causes confusion    Tegretol [Carbamazepine] Hives   Carbamazepine Hives, Rash and Other (See Comments)    Other reaction(s): hives Other reaction(s): Hives Other reaction(s): hives     Social History   Socioeconomic History   Marital status: Married    Spouse name: Not on file   Number of children: Not on file   Years of education: Not on file   Highest education level: Not on file  Occupational History   Not on file  Tobacco Use   Smoking status: Former    Packs/day: 3.00    Years: 48.00    Total pack years: 144.00    Types: Cigarettes    Quit date: 2000    Years since quitting: 23.8   Smokeless tobacco: Never  Vaping Use   Vaping Use: Never used  Substance and Sexual Activity   Alcohol use: Never   Drug use: Never   Sexual activity: Never  Other Topics Concern   Not on file  Social History Narrative   ** Merged History Encounter **       Social Determinants of Health   Financial Resource Strain: Medium Risk (10/26/2021)   Overall Financial Resource Strain (CARDIA)    Difficulty of Paying Living Expenses: Somewhat hard  Food Insecurity: No Food Insecurity (10/26/2021)   Hunger Vital Sign    Worried About Running Out of Food in the Last Year: Never true    Ran Out of Food in the Last Year: Never true  Transportation Needs: Unmet Transportation Needs (10/26/2021)   PRAPARE - Hydrologist (Medical): Yes    Lack of Transportation (Non-Medical): No  Physical Activity: Inactive  (12/15/2019)   Exercise Vital Sign    Days of Exercise per Week: 0 days    Minutes of Exercise per Session: 0 min  Stress: Stress Concern Present (09/26/2018)   E. [place]    Feeling of Stress : To some extent  Social Connections: Not on file  Intimate Partner Violence: Not on file    Physical Exam      Future Appointments  Date Time Provider [place]  01/09/2022  1:45 PM [facility] None  01/24/2022 11:00 AM [facility] PA/NP [facility] None  01/30/2022  2:30 PM [name], MD [facility]  05/17/2022  2:00 PM [name], MD [facility]     ACTION: Home visit completed

## 2021-12-22 ENCOUNTER — Telehealth: Payer: Self-pay | Admitting: *Deleted

## 2021-12-22 NOTE — Telephone Encounter (Signed)
Rescheduled BiV ICD implant for 12/1 Aware we will go over instructions at the 11/27 OV w/ Dr Curt Bears. Patient verbalized understanding and agreeable to plan.

## 2021-12-27 ENCOUNTER — Telehealth: Payer: Self-pay | Admitting: Cardiology

## 2021-12-27 NOTE — Telephone Encounter (Signed)
[facility], Ruben Murray, is calling in regards to this patient. She states she saw him today and he is complaining of chest pain -- or a pressure type feeling in chest. She is requesting someone reach out to patient or her in regards to this.

## 2021-12-27 NOTE — Telephone Encounter (Signed)
Spoke with patient regarding call from [facility] with [facility].  Patient states for the past 1-2 weeks he has been having chest pain with exertion, described as tightness, relieved with rest. Denies sweating, nausea, vomiting, or radiating pain. He states he has SOB, but this is not new.   Advised patient on ED precautions. Will forward to Dr. [name] and his nurse for review.  Patient verbalized understanding.

## 2021-12-28 ENCOUNTER — Other Ambulatory Visit (HOSPITAL_COMMUNITY): Payer: Self-pay

## 2021-12-28 NOTE — Progress Notes (Signed)
Paramedicine Encounter    Patient ID: Ruben Murray, male    DOB: 01/12/1946, 76 y.o.   MRN: [id number]   Arrived for home visit for Mr. Wojdyla who reports to be feeling okay today. He denied any chest pain or dizziness today. He says yesterday he was over-exerting himself helping with his wife and reported off to the [facility] nurse that he had some chest pain associated with this yesterday and they reported same to cardiology office. He denied symptoms today and said he feels fine with no complaints more than his normal shortness of breath when exerting himself.   I obtained assessment and vitals:  No increased lower leg swelling.  Lungs clear and equal bilaterally.   No abdominal distention or JVD.   WT- 231lbs BP- 110/68 HR- 85 RR- 18 O2- 95%   He missed his medications yesterday morning, noon, evening and bedtime. He reports he had a lot going on yesterday and forgot. I reminded him the importance of taking all of his medications as prescribed. He verbalize understanding and agreed to do better to remember. He did have his weight down to 228lbs yesterday and today is 231lbs however he missed those medications and admits to drinking >2L of fluids yesterday. I re-educated him on HF diet and fluid restrictions. He verbalized understanding.   Medications reviewed and confirmed. Pill box filled for one week. Refills as noted:  -phenobarbital (called into [facility]) -loratadine (called into [employer])  -finasteride ([facility])  -buspar ([facility])  Home visit complete. I will see Ronith in one week and he knows to reach out if needed.   [name], [place] 12/28/2021     Patient Care Team: [name], MD as PCP - General (Family Medicine) [name], MD as PCP - Cardiology (Cardiology) [name], MD as PCP - Electrophysiology (Cardiology) [name], MD (Family Medicine) [place], [place] as Case Manager [facility] and  Palliative Medicine)  Patient Active Problem List   Diagnosis Date Noted   Atrial fibrillation [facility]) 11/02/2021   Long term (current) use of anticoagulants 11/02/2021   AKI (acute kidney injury) ([place]) 07/27/2021   Acute pulmonary embolism ([place]) 07/26/2021   Acute on chronic systolic CHF (congestive heart failure) ([place]) 05/17/2021   Hypercoagulable state ([place]) 05/10/2021   Left leg pain 04/24/2021   OSA on CPAP 04/24/2021   Mitral regurgitation 04/24/2021   Tricuspid regurgitation 04/24/2021   Dilated aortic root ([place]) 04/24/2021   Permanent atrial fibrillation ([place]) 04/19/2021   Chronic arthropathy 04/13/2020   Callus 04/13/2020   Onychomycosis 12/12/2019   Left leg cellulitis 01/74/9449   Chronic systolic heart failure ([place]) 12/09/2019   Dilated cardiomyopathy ([place])    Atrial flutter ([place]) 11/03/2019   Atrial flutter with rapid ventricular response ([place]) 11/03/2019   Acquired thrombophilia ([place]) 01/15/2019   Atrial fibrillation with rapid ventricular response ([place]) 10/29/2017   Chronic combined systolic and diastolic heart failure ([place]) 10/29/2017   Prolonged QT interval 10/29/2017   Sensorineural hearing loss (SNHL) of both ears 01/31/2017   Dysphonia 10/03/2016   Laryngopharyngeal reflux (LPR) 10/03/2016   Nasal polyps 10/03/2016   Rhinitis medicamentosa 10/03/2016   Chronic laryngitis 10/03/2016   Hoarseness 08/31/2016   Moderate persistent asthma 08/02/2016   COPD (chronic obstructive pulmonary disease) ([place]) 08/02/2016   Morbid (severe) obesity due to excess calories ([place]) 08/02/2016   Community acquired pneumonia 03/06/2016   Essential hypertension 03/06/2016   Mixed hyperlipidemia 03/06/2016   Right thyroid nodule 03/06/2016  Seizures ([place]) 03/06/2016   Fever 03/12/2015   Recurrent erosion of cornea 06/23/2011    Current Outpatient Medications:    acetaminophen (TYLENOL) 500 MG tablet, Take 1,000 mg by mouth at bedtime as needed for mild pain or headache.,  Disp: , Rfl:    busPIRone (BUSPAR) 5 MG tablet, Take 10 mg by mouth daily., Disp: , Rfl:    Carboxymethylcellulose Sod PF 0.5 % SOLN, Place 1 drop into both eyes 4 (four) times daily as needed (dry eyes)., Disp: , Rfl:    Cholecalciferol (VITAMIN D3) 25 MCG (1000 UT) CAPS, Take 1,000 Units by mouth daily., Disp: , Rfl:    digoxin (LANOXIN) 0.125 MG tablet, Take 1 tablet (0.125 mg total) by mouth daily., Disp: 30 tablet, Rfl: 11   escitalopram (LEXAPRO) 20 MG tablet, Take 20 mg by mouth daily. Takes at bedtime., Disp: , Rfl:    finasteride (PROSCAR) 5 MG tablet, Take 5 mg by mouth daily at 12 noon. , Disp: , Rfl:    fluticasone (FLONASE) 50 MCG/ACT nasal spray, Place 1 spray into both nostrils 2 (two) times daily., Disp: , Rfl:    JARDIANCE 10 MG TABS tablet, TAKE 1 TABLET BY MOUTH DAILY BEFORE BREAKFAST., Disp: 90 tablet, Rfl: 3   loratadine (CLARITIN) 10 MG tablet, Take 10 mg by mouth daily., Disp: , Rfl:    losartan (COZAAR) 25 MG tablet, TAKE 1/2 TABLET BY MOUTH EVERY DAY, Disp: 45 tablet, Rfl: 3   metoprolol succinate (TOPROL-XL) 100 MG 24 hr tablet, Take 1 tablet (100 mg total) by mouth 2 (two) times daily. Take with or immediately following a meal., Disp: 180 tablet, Rfl: 3   Multiple Vitamin (MULTIVITAMIN WITH MINERALS) TABS tablet, Take 1 tablet by mouth daily at 12 noon., Disp: , Rfl:    PHENobarbital (LUMINAL) 97.2 MG tablet, Take 97.2 mg by mouth daily. Takes between 3:00-5:00pm., Disp: , Rfl:    potassium chloride SA (KLOR-CON M) 20 MEQ tablet, Take 1 tablet (20 mEq total) by mouth daily., Disp: 90 tablet, Rfl: 3   pravastatin (PRAVACHOL) 40 MG tablet, Take 40 mg by mouth at bedtime. , Disp: , Rfl:    spironolactone (ALDACTONE) 25 MG tablet, Take 0.5 tablets (12.5 mg total) by mouth daily., Disp: 45 tablet, Rfl: 2   torsemide 60 MG TABS, Take 60 mg by mouth daily., Disp: 30 tablet, Rfl: 1   triamcinolone cream (KENALOG) 0.1 %, Apply 1 application  topically 2 (two) times daily as needed  (rash)., Disp: , Rfl:    warfarin (COUMADIN) 1 MG tablet, Take 6 mg by mouth daily., Disp: , Rfl:  Allergies  Allergen Reactions   Dilaudid [Hydromorphone Hcl] Other (See Comments)    Makes pt hyper    Hydromorphone Other (See Comments)    hyperactiviity Other reaction(s): made him wild Other reaction(s): Unknown   Morphine Sulfate     Other reaction(s): at high dose causes confusion Other reaction(s): at high dose causes confusion Other reaction(s): at high dose causes confusion, Other (See Comments) Other reaction(s): at high dose causes confusion Other reaction(s): at high dose causes confusion    Tegretol [Carbamazepine] Hives   Carbamazepine Hives, Rash and Other (See Comments)    Other reaction(s): hives Other reaction(s): Hives Other reaction(s): hives     Social History   Socioeconomic History   Marital status: Married    Spouse name: Not on file   Number of children: Not on file   Years of education: Not on file   Highest  education level: Not on file  Occupational History   Not on file  Tobacco Use   Smoking status: Former    Packs/day: 3.00    Years: 48.00    Total pack years: 144.00    Types: Cigarettes    Quit date: 2000    Years since quitting: 23.8   Smokeless tobacco: Never  Vaping Use   Vaping Use: Never used  Substance and Sexual Activity   Alcohol use: Never   Drug use: Never   Sexual activity: Never  Other Topics Concern   Not on file  Social History Narrative   ** Merged History Encounter **       Social Determinants of Health   Financial Resource Strain: Medium Risk (10/26/2021)   Overall Financial Resource Strain (CARDIA)    Difficulty of Paying Living Expenses: Somewhat hard  Food Insecurity: No Food Insecurity (10/26/2021)   Hunger Vital Sign    Worried About Running Out of Food in the Last Year: Never true    Ran Out of Food in the Last Year: Never true  Transportation Needs: Unmet Transportation Needs (10/26/2021)   PRAPARE -  Hydrologist (Medical): Yes    Lack of Transportation (Non-Medical): No  Physical Activity: Inactive (12/15/2019)   Exercise Vital Sign    Days of Exercise per Week: 0 days    Minutes of Exercise per Session: 0 min  Stress: Stress Concern Present (09/26/2018)   [place]    Feeling of Stress : To some extent  Social Connections: Not on file  Intimate Partner Violence: Not on file    Physical Exam      Future Appointments  Date Time Provider [place]  01/09/2022  1:45 PM [facility] None  01/24/2022 11:00 AM [facility] PA/NP [facility] None  01/30/2022  2:30 PM [name], MD [facility]  05/17/2022  2:00 PM [name], MD [facility]     ACTION: Home visit completed

## 2021-12-29 ENCOUNTER — Other Ambulatory Visit (HOSPITAL_COMMUNITY): Payer: Self-pay

## 2021-12-29 ENCOUNTER — Telehealth (HOSPITAL_COMMUNITY): Payer: Self-pay | Admitting: Licensed Clinical Social Worker

## 2021-12-29 MED ORDER — FINASTERIDE 5 MG PO TABS: 5.0000 mg | ORAL_TABLET | Freq: Every day | ORAL | 1 refills | Status: DC

## 2021-12-29 NOTE — Telephone Encounter (Signed)
Back when we investigated his coronary arteries in 2021 with heart catheterization, they were normal.  No significant flow-limiting CAD.  Reassuring.  His chest discomfort may be related to his reduced ejection fraction/pump function.  If he would like to, we can try adding isosorbide 30 mg once a day to see if this helps.  [name], MD   Spoke with pt who reports he has been having a hard to describe pressure in his chest off and on but it doesn't occur that often.  I can occur if walking a lot and he becomes short  of breath.  Advised pt of Dr [name] order to try adding Isosorbide daily.  At this time pt prefers not to try anything for the discomfort/SOB.  He reports it doesn't occur that often, he is already taking a lot of medications and he is scheduled soon for a defib to be placed.   Pt states he will call back if he decides he would like to try it.

## 2021-12-29 NOTE — Telephone Encounter (Signed)
HF Paramedicine Team Based Care Meeting  HF MD- NA  HF NP - [place] NP-C   [facility] admit within the last 30 days for heart failure? no  Medications concerns? Med compliance but not able to fill own pillbox  SDOH - sole caregiver for wife with dementia being followed by hospice.  [facility]  E[facility]ible for discharge? Getting ICD in Dec and working with coumadin clinic- would like to follow through procedure especially with social concerns and considering facility placement.  Jorge Ny, LCSW Clinical Social Worker [facility] Desk#: [phone] Cell#: [phone]

## 2022-01-04 ENCOUNTER — Other Ambulatory Visit (HOSPITAL_COMMUNITY): Payer: Self-pay

## 2022-01-04 NOTE — Progress Notes (Signed)
Paramedicine Encounter    Patient ID: Ruben Murray, male    DOB: 08/24/1945, 76 y.o.   MRN: [id number]  Arrived for home visit for Ruben Murray where he was reporting to be feeling okay just tired from assisting his wife this morning with daily living activities. He exhibits some shortness of breath upon exertion. No lower leg swelling noted. He did have a 3 pound weight gain over night and he missed his morning dose of medications yesterday (including his torsemide). Lungs were clear. I reminded him the importance of complying with taking all of his medications. He verbalized understanding just saying that he forgets because he gets so busy caring for his wife. I told him I understood but, he has to remember to care for himself so he can help with her. He agreed.   I obtained vitals as noted.  WT- 230lbs today, 227 yesterday  BP- 146/70 HR- 56 irregular  O2- 97% RR- 18    Pill box filled for one week.   Refills: Over the counter  -Loratadine -Vit D3 -Tylenol   Ruben Murray was reminded of the upcoming appointments and same were written on his calendar. He agreed with same.   Ruben Murray stated that Hospice is paying for their home to be treated for bed bugs this Friday and they will be placing his wife in their Hospice home and he will be either staying with her or in a hotel over the weekend. He advised he will keep me posted.   Home visit complete. I will see Ruben Murray in one week.   [name], [place] 01/04/2022    Patient Care Team: [name], MD as PCP - General (Family Medicine) [name], MD as PCP - Cardiology (Cardiology) [name], MD as PCP - Electrophysiology (Cardiology) [name], MD (Family Medicine) [place], [place] as Case Manager [facility])  Patient Active Problem List   Diagnosis Date Noted   Atrial fibrillation [facility]) 11/02/2021   Long term (current) use of anticoagulants  11/02/2021   AKI (acute kidney injury) ([place]) 07/27/2021   Acute pulmonary embolism ([place]) 07/26/2021   Acute on chronic systolic CHF (congestive heart failure) ([place]) 05/17/2021   Hypercoagulable state ([place]) 05/10/2021   Left leg pain 04/24/2021   OSA on CPAP 04/24/2021   Mitral regurgitation 04/24/2021   Tricuspid regurgitation 04/24/2021   Dilated aortic root ([place]) 04/24/2021   Permanent atrial fibrillation ([place]) 04/19/2021   Chronic arthropathy 04/13/2020   Callus 04/13/2020   Onychomycosis 12/12/2019   Left leg cellulitis 88/82/8003   Chronic systolic heart failure ([place]) 12/09/2019   Dilated cardiomyopathy ([place])    Atrial flutter ([place]) 11/03/2019   Atrial flutter with rapid ventricular response ([place]) 11/03/2019   Acquired thrombophilia ([place]) 01/15/2019   Atrial fibrillation with rapid ventricular response ([place]) 10/29/2017   Chronic combined systolic and diastolic heart failure ([place]) 10/29/2017   Prolonged QT interval 10/29/2017   Sensorineural hearing loss (SNHL) of both ears 01/31/2017   Dysphonia 10/03/2016   Laryngopharyngeal reflux (LPR) 10/03/2016   Nasal polyps 10/03/2016   Rhinitis medicamentosa 10/03/2016   Chronic laryngitis 10/03/2016   Hoarseness 08/31/2016   Moderate persistent asthma 08/02/2016   COPD (chronic obstructive pulmonary disease) ([place]) 08/02/2016   Morbid (severe) obesity due to excess calories ([place]) 08/02/2016   Community acquired pneumonia 03/06/2016   Essential hypertension 03/06/2016   Mixed hyperlipidemia 03/06/2016   Right thyroid nodule 03/06/2016   Seizures ([place]) 03/06/2016   Fever 03/12/2015  Recurrent erosion of cornea 06/23/2011    Current Outpatient Medications:    acetaminophen (TYLENOL) 500 MG tablet, Take 1,000 mg by mouth at bedtime as needed for mild pain or headache., Disp: , Rfl:    busPIRone (BUSPAR) 5 MG tablet, Take 10 mg by mouth daily., Disp: , Rfl:    Carboxymethylcellulose Sod PF 0.5 % SOLN, Place 1 drop into both eyes  4 (four) times daily as needed (dry eyes)., Disp: , Rfl:    Cholecalciferol (VITAMIN D3) 25 MCG (1000 UT) CAPS, Take 1,000 Units by mouth daily., Disp: , Rfl:    digoxin (LANOXIN) 0.125 MG tablet, Take 1 tablet (0.125 mg total) by mouth daily., Disp: 30 tablet, Rfl: 11   escitalopram (LEXAPRO) 20 MG tablet, Take 20 mg by mouth daily. Takes at bedtime., Disp: , Rfl:    finasteride (PROSCAR) 5 MG tablet, Take 1 tablet (5 mg total) by mouth daily at 12 noon., Disp: 90 tablet, Rfl: 1   fluticasone (FLONASE) 50 MCG/ACT nasal spray, Place 1 spray into both nostrils 2 (two) times daily., Disp: , Rfl:    JARDIANCE 10 MG TABS tablet, TAKE 1 TABLET BY MOUTH DAILY BEFORE BREAKFAST., Disp: 90 tablet, Rfl: 3   loratadine (CLARITIN) 10 MG tablet, Take 10 mg by mouth daily., Disp: , Rfl:    losartan (COZAAR) 25 MG tablet, TAKE 1/2 TABLET BY MOUTH EVERY DAY, Disp: 45 tablet, Rfl: 3   metoprolol succinate (TOPROL-XL) 100 MG 24 hr tablet, Take 1 tablet (100 mg total) by mouth 2 (two) times daily. Take with or immediately following a meal., Disp: 180 tablet, Rfl: 3   Multiple Vitamin (MULTIVITAMIN WITH MINERALS) TABS tablet, Take 1 tablet by mouth daily at 12 noon., Disp: , Rfl:    PHENobarbital (LUMINAL) 97.2 MG tablet, Take 97.2 mg by mouth daily. Takes between 3:00-5:00pm., Disp: , Rfl:    potassium chloride SA (KLOR-CON M) 20 MEQ tablet, Take 1 tablet (20 mEq total) by mouth daily., Disp: 90 tablet, Rfl: 3   pravastatin (PRAVACHOL) 40 MG tablet, Take 40 mg by mouth at bedtime. , Disp: , Rfl:    spironolactone (ALDACTONE) 25 MG tablet, Take 0.5 tablets (12.5 mg total) by mouth daily., Disp: 45 tablet, Rfl: 2   torsemide 60 MG TABS, Take 60 mg by mouth daily., Disp: 30 tablet, Rfl: 1   triamcinolone cream (KENALOG) 0.1 %, Apply 1 application  topically 2 (two) times daily as needed (rash)., Disp: , Rfl:    warfarin (COUMADIN) 1 MG tablet, Take 6 mg by mouth daily., Disp: , Rfl:  Allergies  Allergen Reactions    Dilaudid [Hydromorphone Hcl] Other (See Comments)    Makes pt hyper    Hydromorphone Other (See Comments)    hyperactiviity Other reaction(s): made him wild Other reaction(s): Unknown   Morphine Sulfate     Other reaction(s): at high dose causes confusion Other reaction(s): at high dose causes confusion Other reaction(s): at high dose causes confusion, Other (See Comments) Other reaction(s): at high dose causes confusion Other reaction(s): at high dose causes confusion    Tegretol [Carbamazepine] Hives   Carbamazepine Hives, Rash and Other (See Comments)    Other reaction(s): hives Other reaction(s): Hives Other reaction(s): hives     Social History   Socioeconomic History   Marital status: Married    Spouse name: Not on file   Number of children: Not on file   Years of education: Not on file   Highest education level: Not on file  Occupational History   Not on file  Tobacco Use   Smoking status: Former    Packs/day: 3.00    Years: 48.00    Total pack years: 144.00    Types: Cigarettes    Quit date: 2000    Years since quitting: 23.8   Smokeless tobacco: Never  Vaping Use   Vaping Use: Never used  Substance and Sexual Activity   Alcohol use: Never   Drug use: Never   Sexual activity: Never  Other Topics Concern   Not on file  Social History Narrative   ** Merged History Encounter **       Social Determinants of Health   Financial Resource Strain: Medium Risk (10/26/2021)   Overall Financial Resource Strain (CARDIA)    Difficulty of Paying Living Expenses: Somewhat hard  Food Insecurity: No Food Insecurity (10/26/2021)   Hunger Vital Sign    Worried About Running Out of Food in the Last Year: Never true    Ran Out of Food in the Last Year: Never true  Transportation Needs: Unmet Transportation Needs (10/26/2021)   PRAPARE - Hydrologist (Medical): Yes    Lack of Transportation (Non-Medical): No  Physical Activity: Inactive  (12/15/2019)   Exercise Vital Sign    Days of Exercise per Week: 0 days    Minutes of Exercise per Session: 0 min  Stress: Stress Concern Present (09/26/2018)   E. [place]    Feeling of Stress : To some extent  Social Connections: Not on file  Intimate Partner Violence: Not on file    Physical Exam      Future Appointments  Date Time Provider [place]  01/09/2022  1:45 PM [facility] None  01/24/2022 11:00 AM [facility] PA/NP [facility] None  01/30/2022  2:30 PM [name], MD [facility]  05/17/2022  2:00 PM [name], MD [facility]     ACTION: Home visit completed

## 2022-01-09 ENCOUNTER — Ambulatory Visit: Payer: Medicare PPO | Attending: Cardiology

## 2022-01-09 DIAGNOSIS — Z7901 Long term (current) use of anticoagulants: Secondary | ICD-10-CM | POA: Diagnosis not present

## 2022-01-09 DIAGNOSIS — I4891 Unspecified atrial fibrillation: Secondary | ICD-10-CM

## 2022-01-09 LAB — POCT INR: INR: 3.6 — AB (ref 2.0–3.0)

## 2022-01-09 NOTE — Patient Instructions (Signed)
HOLD TONIGHT and then Continue taking 1 tablet (20m) daily except 2 tablets on Saturdays. Stay consistent with greens (1-2 times per week).  Recheck INR in 2 weeks. [facility] [phone]

## 2022-01-11 ENCOUNTER — Other Ambulatory Visit (HOSPITAL_COMMUNITY): Payer: Self-pay

## 2022-01-11 NOTE — Progress Notes (Signed)
Paramedicine Encounter    Patient ID: Ruben Murray, male    DOB: 1945/12/13, 76 y.o.   MRN: [id number]   Arrived for home visit for a med review for Ruben Murray today. He reports he is feeling good today. He arrived back home yesterday after his apartment was treated for bed bugs- he stayed in a motel and reports he is feeling rested. His wife stayed with Ruben Murray during this time. She will be coming home today. He reports no signs of bed bugs in his apartment following the treatment.   He reports he has been taking his medications but missed last nights medications. He just took his morning meds on my arrival. He denied any increased shortness of breath, chest pain, swelling or dizziness. Weight over the last week has been maintaining 228-232lbs.  I reviewed all meds and confirmed same filling pill box for one week.    [facility]ills [facility]: Digoxin Pravastatin   OTC- Vit D Loratatdine   I reviewed appointments with Ruben Murray and plan to return in one week. He knows to reach out if needed. Home visit complete.   [name], [place] 01/11/2022    Patient Care Team: [name], MD as PCP - General (Family Medicine) [name], MD as PCP - Cardiology (Cardiology) [name], MD as PCP - Electrophysiology (Cardiology) [name], MD (Family Medicine) [place], [place] as Case Manager [facility])  Patient Active Problem List   Diagnosis Date Noted   Atrial fibrillation [facility]) 11/02/2021   Long term (current) use of anticoagulants 11/02/2021   AKI (acute kidney injury) ([place]) 07/27/2021   Acute pulmonary embolism ([place]) 07/26/2021   Acute on chronic systolic CHF (congestive heart failure) ([place]) 05/17/2021   Hypercoagulable state ([place]) 05/10/2021   Left leg pain 04/24/2021   OSA on CPAP 04/24/2021   Mitral regurgitation 04/24/2021   Tricuspid regurgitation 04/24/2021   Dilated aortic  root ([place]) 04/24/2021   Permanent atrial fibrillation ([place]) 04/19/2021   Chronic arthropathy 04/13/2020   Callus 04/13/2020   Onychomycosis 12/12/2019   Left leg cellulitis 72/11/4707   Chronic systolic heart failure ([place]) 12/09/2019   Dilated cardiomyopathy ([place])    Atrial flutter ([place]) 11/03/2019   Atrial flutter with rapid ventricular response ([place]) 11/03/2019   Acquired thrombophilia ([place]) 01/15/2019   Atrial fibrillation with rapid ventricular response ([place]) 10/29/2017   Chronic combined systolic and diastolic heart failure ([place]) 10/29/2017   Prolonged QT interval 10/29/2017   Sensorineural hearing loss (SNHL) of both ears 01/31/2017   Dysphonia 10/03/2016   Laryngopharyngeal reflux (LPR) 10/03/2016   Nasal polyps 10/03/2016   Rhinitis medicamentosa 10/03/2016   Chronic laryngitis 10/03/2016   Hoarseness 08/31/2016   Moderate persistent asthma 08/02/2016   COPD (chronic obstructive pulmonary disease) ([place]) 08/02/2016   Morbid (severe) obesity due to excess calories ([place]) 08/02/2016   Community acquired pneumonia 03/06/2016   Essential hypertension 03/06/2016   Mixed hyperlipidemia 03/06/2016   Right thyroid nodule 03/06/2016   Seizures ([place]) 03/06/2016   Fever 03/12/2015   Recurrent erosion of cornea 06/23/2011    Current Outpatient Medications:    acetaminophen (TYLENOL) 500 MG tablet, Take 1,000 mg by mouth at bedtime as needed for mild pain or headache., Disp: , Rfl:    busPIRone (BUSPAR) 5 MG tablet, Take 10 mg by mouth daily., Disp: , Rfl:    Carboxymethylcellulose Sod PF 0.5 % SOLN, Place 1 drop into both eyes 4 (four) times daily as  needed (dry eyes)., Disp: , Rfl:    Cholecalciferol (VITAMIN D3) 25 MCG (1000 UT) CAPS, Take 1,000 Units by mouth daily., Disp: , Rfl:    digoxin (LANOXIN) 0.125 MG tablet, Take 1 tablet (0.125 mg total) by mouth daily., Disp: 30 tablet, Rfl: 11   escitalopram (LEXAPRO) 20 MG tablet, Take 20 mg by mouth daily. Takes at bedtime., Disp: ,  Rfl:    finasteride (PROSCAR) 5 MG tablet, Take 1 tablet (5 mg total) by mouth daily at 12 noon., Disp: 90 tablet, Rfl: 1   fluticasone (FLONASE) 50 MCG/ACT nasal spray, Place 1 spray into both nostrils 2 (two) times daily., Disp: , Rfl:    JARDIANCE 10 MG TABS tablet, TAKE 1 TABLET BY MOUTH DAILY BEFORE BREAKFAST., Disp: 90 tablet, Rfl: 3   loratadine (CLARITIN) 10 MG tablet, Take 10 mg by mouth daily., Disp: , Rfl:    losartan (COZAAR) 25 MG tablet, TAKE 1/2 TABLET BY MOUTH EVERY DAY, Disp: 45 tablet, Rfl: 3   metoprolol succinate (TOPROL-XL) 100 MG 24 hr tablet, Take 1 tablet (100 mg total) by mouth 2 (two) times daily. Take with or immediately following a meal., Disp: 180 tablet, Rfl: 3   Multiple Vitamin (MULTIVITAMIN WITH MINERALS) TABS tablet, Take 1 tablet by mouth daily at 12 noon., Disp: , Rfl:    PHENobarbital (LUMINAL) 97.2 MG tablet, Take 97.2 mg by mouth daily. Takes between 3:00-5:00pm., Disp: , Rfl:    potassium chloride SA (KLOR-CON M) 20 MEQ tablet, Take 1 tablet (20 mEq total) by mouth daily., Disp: 90 tablet, Rfl: 3   pravastatin (PRAVACHOL) 40 MG tablet, Take 40 mg by mouth at bedtime. , Disp: , Rfl:    spironolactone (ALDACTONE) 25 MG tablet, Take 0.5 tablets (12.5 mg total) by mouth daily., Disp: 45 tablet, Rfl: 2   torsemide 60 MG TABS, Take 60 mg by mouth daily., Disp: 30 tablet, Rfl: 1   triamcinolone cream (KENALOG) 0.1 %, Apply 1 application  topically 2 (two) times daily as needed (rash)., Disp: , Rfl:    warfarin (COUMADIN) 1 MG tablet, Take 6 mg by mouth daily., Disp: , Rfl:  Allergies  Allergen Reactions   Dilaudid [Hydromorphone Hcl] Other (See Comments)    Makes pt hyper    Hydromorphone Other (See Comments)    hyperactiviity Other reaction(s): made him wild Other reaction(s): Unknown   Morphine Sulfate     Other reaction(s): at high dose causes confusion Other reaction(s): at high dose causes confusion Other reaction(s): at high dose causes confusion,  Other (See Comments) Other reaction(s): at high dose causes confusion Other reaction(s): at high dose causes confusion    Tegretol [Carbamazepine] Hives   Carbamazepine Hives, Rash and Other (See Comments)    Other reaction(s): hives Other reaction(s): Hives Other reaction(s): hives     Social History   Socioeconomic History   Marital status: Married    Spouse name: Not on file   Number of children: Not on file   Years of education: Not on file   Highest education level: Not on file  Occupational History   Not on file  Tobacco Use   Smoking status: Former    Packs/day: 3.00    Years: 48.00    Total pack years: 144.00    Types: Cigarettes    Quit date: 2000    Years since quitting: 23.8   Smokeless tobacco: Never  Vaping Use   Vaping Use: Never used  Substance and Sexual Activity   Alcohol use:  Never   Drug use: Never   Sexual activity: Never  Other Topics Concern   Not on file  Social History Narrative   ** Merged History Encounter **       Social Determinants of Health   Financial Resource Strain: Medium Risk (10/26/2021)   Overall Financial Resource Strain (CARDIA)    Difficulty of Paying Living Expenses: Somewhat hard  Food Insecurity: No Food Insecurity (10/26/2021)   Hunger Vital Sign    Worried About Running Out of Food in the Last Year: Never true    Ran Out of Food in the Last Year: Never true  Transportation Needs: Unmet Transportation Needs (10/26/2021)   PRAPARE - Hydrologist (Medical): Yes    Lack of Transportation (Non-Medical): No  Physical Activity: Inactive (12/15/2019)   Exercise Vital Sign    Days of Exercise per Week: 0 days    Minutes of Exercise per Session: 0 min  Stress: Stress Concern Present (09/26/2018)   [place]    Feeling of Stress : To some extent  Social Connections: Not on file  Intimate Partner Violence: Not on file    Physical  Exam      Future Appointments  Date Time Provider [place]  01/23/2022  1:45 PM [facility] None  01/24/2022 11:00 AM [facility] PA/NP [facility] None  01/30/2022  2:30 PM [name], MD [facility]  05/17/2022  2:00 PM [name], MD [facility]     ACTION: Home visit completed

## 2022-01-18 ENCOUNTER — Other Ambulatory Visit (HOSPITAL_COMMUNITY): Payer: Self-pay

## 2022-01-18 NOTE — Progress Notes (Signed)
Paramedicine Encounter    Patient ID: Ruben Murray, male    DOB: 08-Apr-1945, 76 y.o.   MRN: [id number]    Arrived for home visit for Mr. Ruben Murray who reports to be feeling okay today  other than just general fatigue from caring for his wife. He reports he has had no increased shortness of breath, dizziness, chest pain or swelling. He has been compliant with his medications over the last week. I reviewed meds looking at his pill bix and note that he missed yesterdays morning and noon doses. I reminded Mr. Ruben Murray the importance of med compliance. He verbalized understanding.   I obtained vitals and assessment.  He had no lower leg swelling noted.  Lungs clear.  WT- 230lbs WT yesterday- 227lbs BP- 102/64 HR- 76 irreg.  RR- 18   I reviewed meds and filled pill box for one week. I also reviewed upcoming appointments and wrote down same in his planner and wall calendar for reminders.   I plan to meet Mr. Ruben Murray in one week in the clinic.   He reports this Friday their home will be receiving the 2nd of 3rd heat treatment for bed bugs. I saw visible bed bugs on patient today.   Home visit complete. I will follow up in clinic next week.   [name], [place] 01/18/2022   Patient Care Team: [name], MD as PCP - General (Family Medicine) [name], MD as PCP - Cardiology (Cardiology) [name], MD as PCP - Electrophysiology (Cardiology) [name], MD (Family Medicine) [place] [place] [facility] as Case Manager [facility] [place] and Palliative Medicine)  Patient Active Problem List   Diagnosis Date Noted   Atrial fibrillation [facility]) 11/02/2021   Long term (current) use of anticoagulants 11/02/2021   AKI (acute kidney injury) ([place]) 07/27/2021   Acute pulmonary embolism ([place]) 07/26/2021   Acute on chronic systolic CHF (congestive heart failure) ([place]) 05/17/2021   Hypercoagulable state ([place]) 05/10/2021   Left leg pain 04/24/2021   OSA on CPAP  04/24/2021   Mitral regurgitation 04/24/2021   Tricuspid regurgitation 04/24/2021   Dilated aortic root ([place]) 04/24/2021   Permanent atrial fibrillation ([place]) 04/19/2021   Chronic arthropathy 04/13/2020   Callus 04/13/2020   Onychomycosis 12/12/2019   Left leg cellulitis 01/75/1025   Chronic systolic heart failure ([place]) 12/09/2019   Dilated cardiomyopathy ([place])    Atrial flutter ([place]) 11/03/2019   Atrial flutter with rapid ventricular response ([place]) 11/03/2019   Acquired thrombophilia ([place]) 01/15/2019   Atrial fibrillation with rapid ventricular response ([place]) 10/29/2017   Chronic combined systolic and diastolic heart failure ([place]) 10/29/2017   Prolonged QT interval 10/29/2017   Sensorineural hearing loss (SNHL) of both ears 01/31/2017   Dysphonia 10/03/2016   Laryngopharyngeal reflux (LPR) 10/03/2016   Nasal polyps 10/03/2016   Rhinitis medicamentosa 10/03/2016   Chronic laryngitis 10/03/2016   Hoarseness 08/31/2016   Moderate persistent asthma 08/02/2016   COPD (chronic obstructive pulmonary disease) ([place]) 08/02/2016   Morbid (severe) obesity due to excess calories ([place]) 08/02/2016   Community acquired pneumonia 03/06/2016   Essential hypertension 03/06/2016   Mixed hyperlipidemia 03/06/2016   Right thyroid nodule 03/06/2016   Seizures ([place]) 03/06/2016   Fever 03/12/2015   Recurrent erosion of cornea 06/23/2011    Current Outpatient Medications:    acetaminophen (TYLENOL) 500 MG tablet, Take 1,000 mg by mouth at bedtime as needed for mild pain or headache., Disp: , Rfl:    busPIRone (BUSPAR) 5 MG tablet, Take 10 mg by  mouth daily., Disp: , Rfl:    Carboxymethylcellulose Sod PF 0.5 % SOLN, Place 1 drop into both eyes 4 (four) times daily as needed (dry eyes)., Disp: , Rfl:    Cholecalciferol (VITAMIN D3) 25 MCG (1000 UT) CAPS, Take 1,000 Units by mouth daily., Disp: , Rfl:    digoxin (LANOXIN) 0.125 MG tablet, Take 1 tablet (0.125 mg total) by mouth daily., Disp: 30 tablet,  Rfl: 11   escitalopram (LEXAPRO) 20 MG tablet, Take 20 mg by mouth daily. Takes at bedtime., Disp: , Rfl:    finasteride (PROSCAR) 5 MG tablet, Take 1 tablet (5 mg total) by mouth daily at 12 noon., Disp: 90 tablet, Rfl: 1   fluticasone (FLONASE) 50 MCG/ACT nasal spray, Place 1 spray into both nostrils 2 (two) times daily., Disp: , Rfl:    JARDIANCE 10 MG TABS tablet, TAKE 1 TABLET BY MOUTH DAILY BEFORE BREAKFAST., Disp: 90 tablet, Rfl: 3   loratadine (CLARITIN) 10 MG tablet, Take 10 mg by mouth daily., Disp: , Rfl:    losartan (COZAAR) 25 MG tablet, TAKE 1/2 TABLET BY MOUTH EVERY DAY, Disp: 45 tablet, Rfl: 3   metoprolol succinate (TOPROL-XL) 100 MG 24 hr tablet, Take 1 tablet (100 mg total) by mouth 2 (two) times daily. Take with or immediately following a meal., Disp: 180 tablet, Rfl: 3   Multiple Vitamin (MULTIVITAMIN WITH MINERALS) TABS tablet, Take 1 tablet by mouth daily at 12 noon., Disp: , Rfl:    PHENobarbital (LUMINAL) 97.2 MG tablet, Take 97.2 mg by mouth daily. Takes between 3:00-5:00pm., Disp: , Rfl:    potassium chloride SA (KLOR-CON M) 20 MEQ tablet, Take 1 tablet (20 mEq total) by mouth daily., Disp: 90 tablet, Rfl: 3   pravastatin (PRAVACHOL) 40 MG tablet, Take 40 mg by mouth at bedtime. , Disp: , Rfl:    spironolactone (ALDACTONE) 25 MG tablet, Take 0.5 tablets (12.5 mg total) by mouth daily., Disp: 45 tablet, Rfl: 2   torsemide 60 MG TABS, Take 60 mg by mouth daily., Disp: 30 tablet, Rfl: 1   triamcinolone cream (KENALOG) 0.1 %, Apply 1 application  topically 2 (two) times daily as needed (rash)., Disp: , Rfl:    warfarin (COUMADIN) 1 MG tablet, Take 6 mg by mouth daily., Disp: , Rfl:  Allergies  Allergen Reactions   Dilaudid [Hydromorphone Hcl] Other (See Comments)    Makes pt hyper    Hydromorphone Other (See Comments)    hyperactiviity Other reaction(s): made him wild Other reaction(s): Unknown   Morphine Sulfate     Other reaction(s): at high dose causes  confusion Other reaction(s): at high dose causes confusion Other reaction(s): at high dose causes confusion, Other (See Comments) Other reaction(s): at high dose causes confusion Other reaction(s): at high dose causes confusion    Tegretol [Carbamazepine] Hives   Carbamazepine Hives, Rash and Other (See Comments)    Other reaction(s): hives Other reaction(s): Hives Other reaction(s): hives     Social History   Socioeconomic History   Marital status: Married    Spouse name: Not on file   Number of children: Not on file   Years of education: Not on file   Highest education level: Not on file  Occupational History   Not on file  Tobacco Use   Smoking status: Former    Packs/day: 3.00    Years: 48.00    Total pack years: 144.00    Types: Cigarettes    Quit date: 2000    Years since  quitting: 23.8   Smokeless tobacco: Never  Vaping Use   Vaping Use: Never used  Substance and Sexual Activity   Alcohol use: Never   Drug use: Never   Sexual activity: Never  Other Topics Concern   Not on file  Social History Narrative   ** Merged History Encounter **       Social Determinants of Health   Financial Resource Strain: Medium Risk (10/26/2021)   Overall Financial Resource Strain (CARDIA)    Difficulty of Paying Living Expenses: Somewhat hard  Food Insecurity: No Food Insecurity (10/26/2021)   Hunger Vital Sign    Worried About Running Out of Food in the Last Year: Never true    Ran Out of Food in the Last Year: Never true  Transportation Needs: Unmet Transportation Needs (10/26/2021)   PRAPARE - Hydrologist (Medical): Yes    Lack of Transportation (Non-Medical): No  Physical Activity: Inactive (12/15/2019)   Exercise Vital Sign    Days of Exercise per Week: 0 days    Minutes of Exercise per Session: 0 min  Stress: Stress Concern Present (09/26/2018)   [place]    Feeling of  Stress : To some extent  Social Connections: Not on file  Intimate Partner Violence: Not on file    Physical Exam      Future Appointments  Date Time Provider [place]  01/23/2022  1:45 PM [facility] None  01/24/2022 11:00 AM [facility] PA/NP [facility] None  02/02/2022  2:30 PM [name], MD [facility]  05/17/2022  2:00 PM [name], MD [facility]     ACTION: Home visit completed

## 2022-01-19 ENCOUNTER — Telehealth (HOSPITAL_COMMUNITY): Payer: Self-pay

## 2022-01-19 NOTE — Telephone Encounter (Signed)
Anna from [facility] called in reference to weight goal for patient. She indicated he is weighing the same time daily and using the same scale. He is urinating before his weights. She confirmed he is taking his medications - using a pill planner from [employer]. No indication of swelling. She did indicate he is always short of breath. I spoke with Emer and then indicated to her that we do not have a specific weight goal. He needs to continue being consistent with weighing and to let us know of any significant weight gain in 24 hours or weekly, increased shortness of breath, or edema noted.

## 2022-01-23 ENCOUNTER — Ambulatory Visit: Payer: Medicare PPO | Attending: Cardiology

## 2022-01-23 ENCOUNTER — Telehealth (HOSPITAL_COMMUNITY): Payer: Self-pay

## 2022-01-23 DIAGNOSIS — I4891 Unspecified atrial fibrillation: Secondary | ICD-10-CM

## 2022-01-23 DIAGNOSIS — Z7901 Long term (current) use of anticoagulants: Secondary | ICD-10-CM | POA: Diagnosis not present

## 2022-01-23 DIAGNOSIS — Z5181 Encounter for therapeutic drug level monitoring: Secondary | ICD-10-CM

## 2022-01-23 LAB — POCT INR: INR: 4.5 — AB (ref 2.0–3.0)

## 2022-01-23 NOTE — Telephone Encounter (Signed)
Called and reminded Ruben Murray about his appointments tomorrow. He verified times and knows to bring medications and pill box. Call complete.   [name], [place] 01/23/2022

## 2022-01-23 NOTE — Patient Instructions (Addendum)
Description   HOLD TONIGHT and eat a serving of green. Then START taking 1 tablet (53m) daily.  Please follow Dr CCurt BearsPre/Post procedure instructions for Warfarin dosing.  Stay consistent with greens (1-2 times per week).   Recheck INR in 2 weeks.  [facility] [phone]

## 2022-01-24 ENCOUNTER — Encounter: Payer: Self-pay | Admitting: Cardiology

## 2022-01-24 ENCOUNTER — Encounter (HOSPITAL_COMMUNITY): Payer: Self-pay

## 2022-01-24 ENCOUNTER — Ambulatory Visit
Admission: RE | Admit: 2022-01-24 | Discharge: 2022-01-24 | Disposition: A | Discharge status: Home / Self Care | Payer: Medicare PPO | Source: Ambulatory Visit | Attending: Family Medicine | Admitting: Family Medicine

## 2022-01-24 ENCOUNTER — Ambulatory Visit (INDEPENDENT_AMBULATORY_CARE_PROVIDER_SITE_OTHER): Payer: Medicare PPO | Admitting: Cardiology

## 2022-01-24 ENCOUNTER — Other Ambulatory Visit (HOSPITAL_COMMUNITY): Payer: Self-pay

## 2022-01-24 VITALS — BP 100/70 | HR 79 | Ht 68.0 in | Wt 235.0 lb

## 2022-01-24 VITALS — BP 98/72 | HR 62 | Ht 68.0 in | Wt 234.8 lb

## 2022-01-24 DIAGNOSIS — I5022 Chronic systolic (congestive) heart failure: Secondary | ICD-10-CM | POA: Insufficient documentation

## 2022-01-24 DIAGNOSIS — I110 Hypertensive heart disease with heart failure: Secondary | ICD-10-CM | POA: Diagnosis not present

## 2022-01-24 DIAGNOSIS — J449 Chronic obstructive pulmonary disease, unspecified: Secondary | ICD-10-CM | POA: Diagnosis not present

## 2022-01-24 DIAGNOSIS — I272 Pulmonary hypertension, unspecified: Secondary | ICD-10-CM

## 2022-01-24 DIAGNOSIS — E785 Hyperlipidemia, unspecified: Secondary | ICD-10-CM | POA: Insufficient documentation

## 2022-01-24 DIAGNOSIS — J4489 Other specified chronic obstructive pulmonary disease: Secondary | ICD-10-CM | POA: Diagnosis not present

## 2022-01-24 DIAGNOSIS — Z6835 Body mass index (BMI) 35.0-35.9, adult: Secondary | ICD-10-CM | POA: Insufficient documentation

## 2022-01-24 DIAGNOSIS — J4540 Moderate persistent asthma, uncomplicated: Secondary | ICD-10-CM | POA: Insufficient documentation

## 2022-01-24 DIAGNOSIS — I10 Essential (primary) hypertension: Secondary | ICD-10-CM | POA: Diagnosis not present

## 2022-01-24 DIAGNOSIS — K219 Gastro-esophageal reflux disease without esophagitis: Secondary | ICD-10-CM | POA: Insufficient documentation

## 2022-01-24 DIAGNOSIS — D6869 Other thrombophilia: Secondary | ICD-10-CM | POA: Diagnosis not present

## 2022-01-24 DIAGNOSIS — Z91148 Patient's other noncompliance with medication regimen for other reason: Secondary | ICD-10-CM | POA: Diagnosis not present

## 2022-01-24 DIAGNOSIS — Z79899 Other long term (current) drug therapy: Secondary | ICD-10-CM | POA: Diagnosis not present

## 2022-01-24 DIAGNOSIS — I4821 Permanent atrial fibrillation: Secondary | ICD-10-CM | POA: Insufficient documentation

## 2022-01-24 DIAGNOSIS — Z7984 Long term (current) use of oral hypoglycemic drugs: Secondary | ICD-10-CM | POA: Insufficient documentation

## 2022-01-24 DIAGNOSIS — I2699 Other pulmonary embolism without acute cor pulmonale: Secondary | ICD-10-CM

## 2022-01-24 DIAGNOSIS — G4733 Obstructive sleep apnea (adult) (pediatric): Secondary | ICD-10-CM | POA: Insufficient documentation

## 2022-01-24 DIAGNOSIS — D6859 Other primary thrombophilia: Secondary | ICD-10-CM | POA: Diagnosis not present

## 2022-01-24 DIAGNOSIS — Z87891 Personal history of nicotine dependence: Secondary | ICD-10-CM | POA: Insufficient documentation

## 2022-01-24 DIAGNOSIS — Z7901 Long term (current) use of anticoagulants: Secondary | ICD-10-CM | POA: Insufficient documentation

## 2022-01-24 DIAGNOSIS — I428 Other cardiomyopathies: Secondary | ICD-10-CM | POA: Insufficient documentation

## 2022-01-24 DIAGNOSIS — I081 Rheumatic disorders of both mitral and tricuspid valves: Secondary | ICD-10-CM | POA: Diagnosis not present

## 2022-01-24 DIAGNOSIS — G40909 Epilepsy, unspecified, not intractable, without status epilepticus: Secondary | ICD-10-CM | POA: Insufficient documentation

## 2022-01-24 LAB — COMPREHENSIVE METABOLIC PANEL
ALT: 24 U/L (ref 0–44)
AST: 29 U/L (ref 15–41)
Albumin: 3.8 g/dL (ref 3.5–5.0)
Alkaline Phosphatase: 121 U/L (ref 38–126)
Anion gap: 9 (ref 5–15)
BUN: 22 mg/dL (ref 8–23)
CO2: 30 mmol/L (ref 22–32)
Calcium: 8.5 mg/dL — ABNORMAL LOW (ref 8.9–10.3)
Chloride: 99 mmol/L (ref 98–111)
Creatinine, Ser: 1.13 mg/dL (ref 0.61–1.24)
GFR, Estimated: >60 mL/min (ref >60)
Glucose, Bld: 101 mg/dL — ABNORMAL HIGH (ref 70–99)
Potassium: 3.8 mmol/L (ref 3.5–5.1)
Sodium: 138 mmol/L (ref 135–145)
Total Bilirubin: 0.7 mg/dL (ref 0.3–1.2)
Total Protein: 7.1 g/dL (ref 6.5–8.1)

## 2022-01-24 LAB — DIGOXIN LEVEL: Digoxin Level: 0.7 ng/mL — ABNORMAL LOW (ref 0.8–2.0)

## 2022-01-24 LAB — CBC
HCT: 44 % (ref 39.0–52.0)
Hemoglobin: 14.3 g/dL (ref 13.0–17.0)
MCH: 32.1 pg (ref 26.0–34.0)
MCHC: 32.5 g/dL (ref 30.0–36.0)
MCV: 98.9 fL (ref 80.0–100.0)
Platelets: 181 10*3/uL (ref 150–400)
RBC: 4.45 MIL/uL (ref 4.22–5.81)
RDW: 14.1 % (ref 11.5–15.5)
WBC: 7.2 10*3/uL (ref 4.0–10.5)
nRBC: 0 % (ref 0.0–0.2)

## 2022-01-24 LAB — BRAIN NATRIURETIC PEPTIDE: B Natriuretic Peptide: 171.2 pg/mL — ABNORMAL HIGH (ref 0.0–100.0)

## 2022-01-24 NOTE — Progress Notes (Signed)
 Electrophysiology Office Note   Date:  01/24/2022   ID:  Ruben Murray, DOB 11/03/1945, MRN [id number]  PCP:  Hagler, Rachel, MD  Cardiologist:  [place] Primary Electrophysiologist:  Burton Gahan Martin Karion Cudd, MD    Chief Complaint: AF   History of Present Illness: Ruben Murray is a 76 y.o. male who is being seen today for the evaluation of AF at the request of Ricky Fenton. Presenting today for electrophysiology evaluation.  He has a history seen for obesity, hypertension, sleep apnea, seizures.  January 2020 was treated for community-acquired pneumonia and was found to be in atrial fibrillation.  He had a failed cardioversion.  Repeat cardioversion 11/04/2018 after being loaded on amiodarone.  He is currently on warfarin.  Most recently was admitted 05/24/2021 with atrial fibrillation which was difficult to control.  This is improved on both metoprolol and digoxin.  He was diuresed 19.7 L.  LFTs improved during hospitalization.  Today, denies symptoms of palpitations, chest pain, shortness of breath, orthopnea, PND, lower extremity edema, claudication, dizziness, presyncope, syncope, bleeding, or neurologic sequela. The patient is tolerating medications without difficulties.     Past Medical History:  Diagnosis Date   Acquired thrombophilia (HCC) 01/15/2019   Aortic atherosclerosis (HCC)    Aortic root dilatation (HCC)    CAP (community acquired pneumonia) 03/06/2016   Chronic laryngitis 10/03/2016   Chronic systolic CHF (congestive heart failure) (HCC)    COPD GOLD II  08/02/2016   Quit smoking 2000  PFT's  07/10/2016  FEV1 1.98 (70 % ) ratio 67  p 19 % improvement from saba p nothing  prior to study while of coreg so rec as of 08/02/2016 try off coreg and on bisoprolol      Essential hypertension 03/06/2016   Changed from coreg to bisoprolol due to copd with reversible component  08/02/2016 >>>    Hoarseness 08/31/2016   Referred to ent 08/31/2016 >>> seen 10/03/16 [facility] dx gerd and  rhinitis medicamentosa   > improved on f/u 01/31/17 on gerd rx/ flonase and off afrin   Hyperlipidemia    Hypertension    Laryngopharyngeal reflux (LPR) 10/03/2016   Mitral regurgitation    Moderate persistent asthma 08/02/2016   Morbid (severe) obesity due to excess calories (HCC) 08/02/2016   Nasal polyps 10/03/2016   NICM (nonischemic cardiomyopathy) (HCC)    a. EF 40-45% in 04/2016, etiology not defined, managed medically.   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   Rhinitis medicamentosa 10/03/2016   Right thyroid nodule 03/06/2016   Seizures (HCC)    "take RX daily" (03/06/2016)   Sensorineural hearing loss (SNHL) of both ears 01/31/2017   Tricuspid regurgitation    Past Surgical History:  Procedure Laterality Date   CARDIOVERSION N/A 12/13/2017   Procedure: CARDIOVERSION;  Surgeon: Avenal, Tiffany, MD;  [facility];  Service: Cardiovascular;  Laterality: N/A;   CARDIOVERSION N/A 09/25/2018   Procedure: CARDIOVERSION;  Surgeon: Hilty, Kenneth C, MD;  Location: [facility];  Service: Cardiovascular;  Laterality: N/A;   CARDIOVERSION N/A 11/04/2018   Procedure: CARDIOVERSION;  Surgeon: Nahser, Philip J, MD;  Location: [facility];  Service: Cardiovascular;  Laterality: N/A;   CARDIOVERSION N/A 04/11/2019   Procedure: CARDIOVERSION;  Surgeon: Crenshaw, Brian S, MD;  Location: [facility];  Service: Cardiovascular;  Laterality: N/A;   CIRCUMCISION     JOINT REPLACEMENT       PERCUTANEOUS PINNING TOE FRACTURE Left    "big toe   REPLACEMENT TOTAL KNEE Left    RIGHT/LEFT HEART CATH AND CORONARY ANGIOGRAPHY N/A 11/06/2019   Procedure: RIGHT/LEFT HEART CATH AND CORONARY ANGIOGRAPHY;  Surgeon: Kelly, Thomas A, MD;  Location: [facility]AS[facility];  Service: Cardiovascular;  Laterality: N/A;   TONSILLECTOMY AND ADENOIDECTOMY       Current Outpatient Medications  Medication Sig  Dispense Refill   acetaminophen (TYLENOL) 500 MG tablet Take 1,000 mg by mouth at bedtime as needed for mild pain or headache.     busPIRone (BUSPAR) 5 MG tablet Take 10 mg by mouth daily.     Carboxymethylcellulose Sod PF 0.5 % SOLN Place 1 drop into both eyes 4 (four) times daily as needed (dry eyes).     Cholecalciferol (VITAMIN D3) 25 MCG (1000 UT) CAPS Take 1,000 Units by mouth daily.     digoxin (LANOXIN) 0.125 MG tablet Take 1 tablet (0.125 mg total) by mouth daily. 30 tablet 11   escitalopram (LEXAPRO) 20 MG tablet Take 20 mg by mouth daily. Takes at bedtime.     finasteride (PROSCAR) 5 MG tablet Take 1 tablet (5 mg total) by mouth daily at 12 noon. 90 tablet 1   fluticasone (FLONASE) 50 MCG/ACT nasal spray Place 1 spray into both nostrils 2 (two) times daily.     JARDIANCE 10 MG TABS tablet TAKE 1 TABLET BY MOUTH DAILY BEFORE BREAKFAST. 90 tablet 3   loratadine (CLARITIN) 10 MG tablet Take 10 mg by mouth daily.     losartan (COZAAR) 25 MG tablet TAKE 1/2 TABLET BY MOUTH EVERY DAY 45 tablet 3   metoprolol succinate (TOPROL-XL) 100 MG 24 hr tablet Take 1 tablet (100 mg total) by mouth 2 (two) times daily. Take with or immediately following a meal. 180 tablet 3   Multiple Vitamin (MULTIVITAMIN WITH MINERALS) TABS tablet Take 1 tablet by mouth daily at 12 noon.     PHENobarbital (LUMINAL) 97.2 MG tablet Take 97.2 mg by mouth daily. Takes between 3:00-5:00pm.     potassium chloride SA (KLOR-CON M) 20 MEQ tablet Take 1 tablet (20 mEq total) by mouth daily. 90 tablet 3   pravastatin (PRAVACHOL) 40 MG tablet Take 40 mg by mouth at bedtime.      spironolactone (ALDACTONE) 25 MG tablet Take 0.5 tablets (12.5 mg total) by mouth daily. 45 tablet 2   torsemide 60 MG TABS Take 60 mg by mouth daily. 30 tablet 1   triamcinolone cream (KENALOG) 0.1 % Apply 1 application  topically 2 (two) times daily as needed (rash).     warfarin (COUMADIN) 1 MG tablet Take 6 mg by mouth daily.     No current  facility-administered medications for this visit.    Allergies:   Dilaudid [hydromorphone hcl], Hydromorphone, Morphine sulfate, Tegretol [carbamazepine], and Carbamazepine   Social History:  The patient  reports that he quit smoking about 23 years ago. His smoking use included cigarettes. He has a 144.00 pack-year smoking history. He has never used smokeless tobacco. He reports that he does not drink alcohol and does not use drugs.   Family History:  The patient's family history includes Hypertension in his mother; Other in his father.   ROS:  Please see the history of present illness.   Otherwise, review of systems is positive for none.   All other systems are reviewed and negative.   PHYSICAL EXAM: VS:  BP 100/70 (BP Location: Left Arm, Patient Position: Sitting, Cuff   Size: Normal)   Pulse 79   Ht _0  (1.727 m)   Wt 235 lb (106.6 kg)   SpO2 96%   BMI 35.73 kg/m  , BMI Body mass index is 35.73 kg/m. GEN: Well nourished, well developed, in no acute distress  HEENT: normal  Neck: no JVD, carotid bruits, or masses Cardiac: RRR; no murmurs, rubs, or gallops,no edema  Respiratory:  clear to auscultation bilaterally, normal work of breathing GI: soft, nontender, nondistended, + BS MS: no deformity or atrophy  Skin: warm and dry Neuro:  Strength and sensation are intact Psych: euthymic mood, full affect  EKG:  EKG is ordered today. Personal review of the ekg ordered shows AF, RBBB   Recent Labs: 04/19/2021: TSH 1.898 08/01/2021: Magnesium 2.4 10/24/2021: ALT 26; B Natriuretic Peptide 222.7; BUN 24; Creatinine, Ser 1.18; Hemoglobin 14.9; Platelets 205; Potassium 4.0; Sodium 138    Lipid Panel     Component Value Date/Time   CHOL 110 07/30/2021 0256   TRIG 67 07/30/2021 0256   HDL 38 (L) 07/30/2021 0256   CHOLHDL 2.9 07/30/2021 0256   VLDL 13 07/30/2021 0256   LDLCALC 59 07/30/2021 0256     Wt Readings from Last 3 Encounters:  01/24/22 235 lb (106.6 kg)  01/18/22 230 lb  (104.3 kg)  12/28/21 231 lb (104.8 kg)      Other studies Reviewed: Additional studies/ records that were reviewed today include: TTE 04/20/21 Review of the above records today demonstrates:   1. Left ventricular ejection fraction, by estimation, is 20 to 25%. The  left ventricle has severely decreased function. The left ventricle  demonstrates global hypokinesis. The left ventricular internal cavity size  was moderately dilated. Left  ventricular diastolic parameters are indeterminate.   2. Right ventricular systolic function is mildly reduced. The right  ventricular size is mildly enlarged. There is moderately elevated  pulmonary artery systolic pressure. The estimated right ventricular  systolic pressure is 13.0 mmHg.   3. Left atrial size was moderately dilated.   4. Right atrial size was moderately dilated.   5. The mitral valve is normal in structure. Mild mitral valve  regurgitation. No evidence of mitral stenosis.   6. Tricuspid valve regurgitation is mild to moderate.   7. The aortic valve is tricuspid. Aortic valve regurgitation is mild. No  aortic stenosis is present.   8. Aortic dilatation noted. There is mild dilatation of the aortic root,  measuring 39 mm.   9. The inferior vena cava is dilated in size with <50% respiratory  variability, suggesting right atrial pressure of 15 mmHg.    ASSESSMENT AND PLAN:  1.  Permanent atrial fibrillation: Currently on digoxin 0.125 mg daily, metoprolol 100 mg twice daily, warfarin.  CHA2DS2-VASc of 4.  Well rate controlled today.  2.  Obstructive sleep apnea: CPAP compliance encouraged  3.  Chronic systolic heart failure: Currently on Aldactone 12.5 mg daily, metoprolol 100 mg twice daily.  Blood pressure is low and thus not able to tolerate alternative medications.  Plan for CRT-D implant.  We Dallys Nowakowski implant a coronary sinus lead in case his atrial fibrillation becomes rapid and we need AV node ablation.  Risk and benefits of been  discussed.  This include bleeding, tamponade, infection, pneumothorax, lead dislodgment.  He understands these risks and is agreed to the procedure.  4.  Second hypercoagulable state: Currently on warfarin for atrial fibrillation as above   Current medicines are reviewed at length with the patient today.   The  patient does not have concerns regarding his medicines.  The following changes were made today: None  Labs/ tests ordered today include:  Orders Placed This Encounter  Procedures   EKG 12-Lead     Disposition:   FU with Jenefer Woerner 3 months  Signed, Shahzad Thomann Martin Illya Gienger, MD  01/24/2022 10:59 AM     [facility] [address] [place] [address] [phone] (office) [phone] (fax) 

## 2022-01-24 NOTE — Progress Notes (Signed)
Paramedicine Encounter    Patient ID: Ruben Murray, male    DOB: 11/16/1945, 76 y.o.   MRN: [id number]   Arrived for clinic visit for Ruben Murray who was seen by Allena Katz NP. She made no med changes today. Will have labs drawn and PFT tests scheduled due to continued shortness of breath and Ruben Murray reporting he was "taken off CPAP" by Dr. Maxwell Caul years ago.   INR elevated yesterday- I reviewed notes and meds were confirmed. I filled pill box for one week.   I reviewed upcoming appointments and refills needed [facility] picked [facility] at [facility].  -Warfarin 68m   I plan to see Ruben Murray one week. He agreed and clinic visit was complete.   HSalena Saner EHuerfano11/21/2023        Patient Care Team: HCaren Macadam MD as PCP - General (Family Medicine) SJerline Pain MD as PCP - Cardiology (Cardiology) CConstance Haw MD as PCP - Electrophysiology (Cardiology) HCaren Macadam MD (Family Medicine) PEagle [facility] The as Case Manager ([facility])  Patient Active Problem List   Diagnosis Date Noted   Atrial fibrillation ([facility] 11/02/2021   Long term (current) use of anticoagulants 11/02/2021   AKI (acute kidney injury) ([facility] 07/27/2021   Acute pulmonary embolism ([facility] 07/26/2021   Acute on chronic systolic CHF (congestive heart failure) ([facility] 05/17/2021   Hypercoagulable state (HH[place] 05/10/2021   Left leg pain 04/24/2021   OSA on CPAP 04/24/2021   Mitral regurgitation 04/24/2021   Tricuspid regurgitation 04/24/2021   Dilated aortic root (HL[place] 04/24/2021   Permanent atrial fibrillation ([facility] 04/19/2021   Chronic arthropathy 04/13/2020   Callus 04/13/2020   Onychomycosis 12/12/2019   Left leg cellulitis 128/36/6294  Chronic systolic heart failure ([facility] 12/09/2019   Dilated cardiomyopathy (HA[place]    Atrial flutter (HL[place] 11/03/2019   Atrial flutter with rapid ventricular response ([place] 11/03/2019   Acquired thrombophilia (HP[place]  01/15/2019   Atrial fibrillation with rapid ventricular response (H[place] 10/29/2017   Chronic combined systolic and diastolic heart failure (H[place] 10/29/2017   Prolonged QT interval 10/29/2017   Sensorineural hearing loss (SNHL) of both ears 01/31/2017   Dysphonia 10/03/2016   Laryngopharyngeal reflux (LPR) 10/03/2016   Nasal polyps 10/03/2016   Rhinitis medicamentosa 10/03/2016   Chronic laryngitis 10/03/2016   Hoarseness 08/31/2016   Moderate persistent asthma 08/02/2016   COPD (chronic obstructive pulmonary disease) (H[place] 08/02/2016   Morbid (severe) obesity due to excess calories (H[place] 08/02/2016   Community acquired pneumonia 03/06/2016   Essential hypertension 03/06/2016   Mixed hyperlipidemia 03/06/2016   Right thyroid nodule 03/06/2016   Seizures (H[place] 03/06/2016   Fever 03/12/2015   Recurrent erosion of cornea 06/23/2011    Current Outpatient Medications:    acetaminophen (TYLENOL) 500 MG tablet, Take 1,000 mg by mouth at bedtime as needed for mild pain or headache., Disp: , Rfl:    busPIRone (BUSPAR) 5 MG tablet, Take 10 mg by mouth daily., Disp: , Rfl:    Carboxymethylcellulose Sod PF 0.5 % SOLN, Place 1 drop into both eyes 4 (four) times daily as needed (dry eyes)., Disp: , Rfl:    Cholecalciferol (VITAMIN D3) 25 MCG (1000 UT) CAPS, Take 1,000 Units by mouth daily., Disp: , Rfl:    digoxin (LANOXIN) 0.125 MG tablet, Take 1 tablet (0.125 mg total) by mouth daily., Disp: 30 tablet, Rfl: 11   escitalopram (LEXAPRO) 20 MG tablet, Take 20 mg by mouth daily. Takes at bedtime., Disp: ,  Rfl:    finasteride (PROSCAR) 5 MG tablet, Take 1 tablet (5 mg total) by mouth daily at 12 noon., Disp: 90 tablet, Rfl: 1   fluticasone (FLONASE) 50 MCG/ACT nasal spray, Place 1 spray into both nostrils 2 (two) times daily., Disp: , Rfl:    JARDIANCE 10 MG TABS tablet, TAKE 1 TABLET BY MOUTH DAILY BEFORE BREAKFAST., Disp: 90 tablet, Rfl: 3   loratadine (CLARITIN) 10 MG tablet, Take 10 mg by mouth  daily., Disp: , Rfl:    losartan (COZAAR) 25 MG tablet, TAKE 1/2 TABLET BY MOUTH EVERY DAY, Disp: 45 tablet, Rfl: 3   metoprolol succinate (TOPROL-XL) 100 MG 24 hr tablet, Take 1 tablet (100 mg total) by mouth 2 (two) times daily. Take with or immediately following a meal., Disp: 180 tablet, Rfl: 3   Multiple Vitamin (MULTIVITAMIN WITH MINERALS) TABS tablet, Take 1 tablet by mouth daily at 12 noon., Disp: , Rfl:    PHENobarbital (LUMINAL) 97.2 MG tablet, Take 97.2 mg by mouth daily. Takes between 3:00-5:00pm., Disp: , Rfl:    potassium chloride SA (KLOR-CON M) 20 MEQ tablet, Take 1 tablet (20 mEq total) by mouth daily., Disp: 90 tablet, Rfl: 3   pravastatin (PRAVACHOL) 40 MG tablet, Take 40 mg by mouth at bedtime. , Disp: , Rfl:    spironolactone (ALDACTONE) 25 MG tablet, Take 0.5 tablets (12.5 mg total) by mouth daily., Disp: 45 tablet, Rfl: 2   torsemide 60 MG TABS, Take 60 mg by mouth daily., Disp: 30 tablet, Rfl: 1   triamcinolone cream (KENALOG) 0.1 %, Apply 1 application  topically 2 (two) times daily as needed (rash)., Disp: , Rfl:    warfarin (COUMADIN) 1 MG tablet, Take 6 mg by mouth daily., Disp: , Rfl:  Allergies  Allergen Reactions   Dilaudid [Hydromorphone Hcl] Other (See Comments)    Makes pt hyper    Hydromorphone Other (See Comments)    hyperactiviity Other reaction(s): made him wild Other reaction(s): Unknown   Morphine Sulfate     Other reaction(s): at high dose causes confusion Other reaction(s): at high dose causes confusion Other reaction(s): at high dose causes confusion, Other (See Comments) Other reaction(s): at high dose causes confusion Other reaction(s): at high dose causes confusion    Tegretol [Carbamazepine] Hives   Carbamazepine Hives, Rash and Other (See Comments)    Other reaction(s): hives Other reaction(s): Hives Other reaction(s): hives     Social History   Socioeconomic History   Marital status: Married    Spouse name: Not on file   Number of  children: Not on file   Years of education: Not on file   Highest education level: Not on file  Occupational History   Not on file  Tobacco Use   Smoking status: Former    Packs/day: 3.00    Years: 48.00    Total pack years: 144.00    Types: Cigarettes    Quit date: 2000    Years since quitting: 23.9   Smokeless tobacco: Never  Vaping Use   Vaping Use: Never used  Substance and Sexual Activity   Alcohol use: Never   Drug use: Never   Sexual activity: Never  Other Topics Concern   Not on file  Social History Narrative   ** Merged History Encounter **       Social Determinants of Health   Financial Resource Strain: Medium Risk (10/26/2021)   Overall Financial Resource Strain (CARDIA)    Difficulty of Paying Living Expenses: Somewhat hard  Food Insecurity: No Food Insecurity (10/26/2021)   Hunger Vital Sign    Worried About Running Out of Food in the Last Year: Never true    Ran Out of Food in the Last Year: Never true  Transportation Needs: Unmet Transportation Needs (10/26/2021)   PRAPARE - Hydrologist (Medical): Yes    Lack of Transportation (Non-Medical): No  Physical Activity: Inactive (12/15/2019)   Exercise Vital Sign    Days of Exercise per Week: 0 days    Minutes of Exercise per Session: 0 min  Stress: Stress Concern Present (09/26/2018)   [place]    Feeling of Stress : To some extent  Social Connections: Not on file  Intimate Partner Violence: Not on file    Physical Exam      Future Appointments  Date Time Provider [place]  02/08/2022  1:30 PM [facility] None  05/17/2022  2:00 PM [name], MD [facility]     ACTION: Home visit completed

## 2022-01-24 NOTE — Progress Notes (Signed)
Advanced Heart Failure Clinic Note   Primary Care: [name], MD HF Cardiologist: Dr. Haroldine Laws  HPI: Ruben Murray is a 76 y.o.with HFrEF d/t NICM, permanent A fib, pulmonary HTN,  OSA, seizure d/o, COPD and HTN   Echo 2018 EF 40-45%  Lost to cardiology f/u 2022.    Admitted  04/2021 for a/c CHF in the setting of rapid afib. Echo EF 20-25%, RV mildly reduced, RVSP 48 mmHg, mild Ruben, mild TR. He was diuresed w/ IV Lasix. Afib treated w/ rate control, w/ metoprolol and digoxin. Amiodarone discontinued given chronic afib.  Wt charted at 227 lb.    He was seen by Dr. Curt Bears . Plan is for CRT-D implant w/ plans to consider AV node ablation if further issues w/ rapid afib.    Had initial TOC visit 05/04/2021. HF meds adjusted, but he did not start them.    Admitted 05/17/21 with AF RVR and A/C HFrEF.  Prior to admit he never picked up digoxin, Toprol, or Jardiance. Diuresed with IV lasix.  Discharged on lopressor 100 bid, digoxin 0.25 , and torsemide 40 mg bid. Discharge weight 214 pounds.   Seen in [facility] 3/23, volume trending up. Torsemide increased and spiro started. Referred to paramedicine.  Readmitted 5/23 with AFib with RVR, HR 150's, INR 3.8. CTA chest with small PE w/ R heart strain, also with volume overload. Echo with EF 20%. No DOAC due to hx of seizures and phenobarbital use.  AFib controlled with metoprolol 100 bid and digoxin (dig previously stopped due to elevated level). PMT consulted for [place], and he remained DNR. He was discharged home with HH, weight 228 lbs.  Scheduled for CRT-D implant 10/21/21 with Dr. Curt Bears but cancelled due to INR 4.3   Follow up 8/23.Marland KitchenMarland KitchenMarland KitchenToday he returns for HF follow up with paramedic, Ruben Murray. Stable NYHA III. SOB with minimal exertion. No change. No CP, edema, orthopnea or PND. Compliant with meds. AF rates 70-90s per Paramedicine.   Saw EP today, planning on CRT-D soon.   Today he returns for HF follow up. Overall feeling fine. Remains SOB with  minimal exertion, this is his baseline. Does OK walking short distances on flat ground. Denies palpitations, abnormal bleeding, CP, dizziness, edema, or PND/Orthopnea. Appetite ok. No fever or chills. Weight at home 228 pounds. Taking all medications. Planning on CRT soon, INR elevated yesterday.   Cardiac Testing  - Echo (5/23): EF 20%, RV moderately down, moderate to severe TR, mild Ruben (he was in AF and volume overloaded).  - Echo (2/23): EF 20-25%, RV mildly reduced, RVSP 48, mild Ruben, mild TR   - L/RHC (9/21): Normal coronaries, mild pulmonary hypertension; PA 34/22, mean PA pressure 28 mmHg   - Echo (8/21): EF 20-25%, RV moderately reduced, RVSP 38, Mild Ruben, mild TR  - Echo (1/20): EF 40-45%, RV mildly dilated, systolic fx normal   - Echo (8/19): EF 25%, RV moderately reduced (in Afib)  - Echo (2/18): EF 40-45%, RV normal, mod TR  Past Medical History:  Diagnosis Date   Acquired thrombophilia ([place]) 01/15/2019   Aortic atherosclerosis ([place])    Aortic root dilatation (HCC)    CAP (community acquired pneumonia) 03/06/2016   Chronic laryngitis 92/92/4462   Chronic systolic CHF (congestive heart failure) (HCC)    COPD GOLD II  08/02/2016   Quit smoking 2000  PFT's  07/10/2016  FEV1 1.98 (70 % ) ratio 67  p 19 % improvement from saba p nothing  prior to study while of  coreg so rec as of 08/02/2016 try off coreg and on bisoprolol      Essential hypertension 03/06/2016   Changed from coreg to bisoprolol due to copd with reversible component  08/02/2016 >>>    Hoarseness 08/31/2016   Referred to ent 08/31/2016 >>> seen 10/03/16 [name] dx gerd and rhinitis medicamentosa   > improved on f/u 01/31/17 on gerd rx/ flonase and off afrin   Hyperlipidemia    Hypertension    Laryngopharyngeal reflux (LPR) 10/03/2016   Mitral regurgitation    Moderate persistent asthma 08/02/2016   Morbid (severe) obesity due to excess calories ([place]) 08/02/2016   Nasal polyps 10/03/2016   NICM (nonischemic  cardiomyopathy) ([place])    a. EF 40-45% in 04/2016, etiology not defined, managed medically.   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   Rhinitis medicamentosa 10/03/2016   Right thyroid nodule 03/06/2016   Seizures ([place])    "take RX daily" (03/06/2016)   Sensorineural hearing loss (SNHL) of both ears 01/31/2017   Tricuspid regurgitation    Current Outpatient Medications  Medication Sig Dispense Refill   acetaminophen (TYLENOL) 500 MG tablet Take 1,000 mg by mouth at bedtime as needed for mild pain or headache.     busPIRone (BUSPAR) 5 MG tablet Take 10 mg by mouth daily.     Cholecalciferol (VITAMIN D3) 25 MCG (1000 UT) CAPS Take 1,000 Units by mouth daily.     digoxin (LANOXIN) 0.125 MG tablet Take 1 tablet (0.125 mg total) by mouth daily. 30 tablet 11   escitalopram (LEXAPRO) 20 MG tablet Take 20 mg by mouth daily. Takes at bedtime.     finasteride (PROSCAR) 5 MG tablet Take 1 tablet (5 mg total) by mouth daily at 12 noon. 90 tablet 1   fluticasone (FLONASE) 50 MCG/ACT nasal spray Place 1 spray into both nostrils 2 (two) times daily.     JARDIANCE 10 MG TABS tablet TAKE 1 TABLET BY MOUTH DAILY BEFORE BREAKFAST. 90 tablet 3   loratadine (CLARITIN) 10 MG tablet Take 10 mg by mouth daily.     losartan (COZAAR) 25 MG tablet TAKE 1/2 TABLET BY MOUTH EVERY DAY 45 tablet 3   metoprolol succinate (TOPROL-XL) 100 MG 24 hr tablet Take 1 tablet (100 mg total) by mouth 2 (two) times daily. Take with or immediately following a meal. 180 tablet 3   Multiple Vitamin (MULTIVITAMIN WITH MINERALS) TABS tablet Take 1 tablet by mouth daily at 12 noon.     PHENobarbital (LUMINAL) 97.2 MG tablet Take 97.2 mg by mouth daily. Takes between 3:00-5:00pm.     potassium chloride SA (KLOR-CON M) 20 MEQ tablet Take 1 tablet (20 mEq total) by mouth daily. 90 tablet 3   pravastatin (PRAVACHOL) 40 MG tablet Take 40 mg by mouth  at bedtime.      spironolactone (ALDACTONE) 25 MG tablet Take 0.5 tablets (12.5 mg total) by mouth daily. 45 tablet 2   torsemide 60 MG TABS Take 60 mg by mouth daily. 30 tablet 1   triamcinolone cream (KENALOG) 0.1 % Apply 1 application  topically 2 (two) times daily as needed (rash).     warfarin (COUMADIN) 1 MG tablet Take 6 mg by mouth daily.     Carboxymethylcellulose Sod PF 0.5 % SOLN Place 1 drop into both eyes 4 (four) times daily as needed (dry eyes).     No  current facility-administered medications for this encounter.   Allergies  Allergen Reactions   Dilaudid [Hydromorphone Hcl] Other (See Comments)    Makes pt hyper    Hydromorphone Other (See Comments)    hyperactiviity Other reaction(s): made him wild Other reaction(s): Unknown   Morphine Sulfate     Other reaction(s): at high dose causes confusion Other reaction(s): at high dose causes confusion Other reaction(s): at high dose causes confusion, Other (See Comments) Other reaction(s): at high dose causes confusion Other reaction(s): at high dose causes confusion    Tegretol [Carbamazepine] Hives   Carbamazepine Hives, Rash and Other (See Comments)    Other reaction(s): hives Other reaction(s): Hives Other reaction(s): hives   Social History   Socioeconomic History   Marital status: Married    Spouse name: Not on file   Number of children: Not on file   Years of education: Not on file   Highest education level: Not on file  Occupational History   Not on file  Tobacco Use   Smoking status: Former    Packs/day: 3.00    Years: 48.00    Total pack years: 144.00    Types: Cigarettes    Quit date: 2000    Years since quitting: 23.9   Smokeless tobacco: Never  Vaping Use   Vaping Use: Never used  Substance and Sexual Activity   Alcohol use: Never   Drug use: Never   Sexual activity: Never  Other Topics Concern   Not on file  Social History Narrative   ** Merged History Encounter **       Social  Determinants of Health   Financial Resource Strain: Medium Risk (10/26/2021)   Overall Financial Resource Strain (CARDIA)    Difficulty of Paying Living Expenses: Somewhat hard  Food Insecurity: No Food Insecurity (10/26/2021)   Hunger Vital Sign    Worried About Running Out of Food in the Last Year: Never true    Ran Out of Food in the Last Year: Never true  Transportation Needs: Unmet Transportation Needs (10/26/2021)   PRAPARE - Hydrologist (Medical): Yes    Lack of Transportation (Non-Medical): No  Physical Activity: Inactive (12/15/2019)   Exercise Vital Sign    Days of Exercise per Week: 0 days    Minutes of Exercise per Session: 0 min  Stress: Stress Concern Present (09/26/2018)   [place]    Feeling of Stress : To some extent  Social Connections: Not on file  Intimate Partner Violence: Not on file   Family History  Problem Relation Age of Onset   Hypertension Mother    Other Father        sudden cardiac arrest   BP 98/72   Pulse 62   Ht _0  (1.727 m)   Wt 106.5 kg (234 lb 12.8 oz)   SpO2 98%   BMI 35.70 kg/m   Wt Readings from Last 3 Encounters:  01/24/22 106.5 kg (234 lb 12.8 oz)  01/24/22 106.6 kg (235 lb)  01/18/22 104.3 kg (230 lb)   PHYSICAL EXAM: General:  NAD. No resp difficulty, chronically-ill appearing. HEENT: Normal Neck: Supple. No JVD. Carotids 2+ bilat; no bruits. No lymphadenopathy or thryomegaly appreciated. Cor: PMI nondisplaced. Irregular rate & rhythm. No rubs, gallops or murmurs. Lungs: Clear Abdomen: Soft, obese, nontender, nondistended. No hepatosplenomegaly. No bruits or masses. Good bowel sounds. Extremities: No cyanosis, clubbing, rash, edema Neuro: Alert & oriented x 3,  cranial nerves grossly intact. Moves all 4 extremities w/o difficulty. Affect pleasant.  ASSESSMENT & PLAN: 1. Chronic Systolic Heart Failure - NICM. Dates back to at  least 2018, EFs have fluctuated from 20-40% - most recent echo 2/22 EF 20-25%, RV moderately reduced - LHC in 2021 showed normal coronaries - suspect tachy mediated CM from long history of chronic, poorly rate controlled Afib/flutter  - He does not tolerate rapid Afib well, in setting of severe LV dysfunction  - NYHA III chronically. Volume looks good.  - Planning CRT soon. - Continue losartan 12.5 mg daily. [name] cost-prohibitive) - Continue torsemide 60 mg daily. - Continue digoxin 0.125 mg daily. - Continue Jardiance 10 mg daily. - Continue Toprol XL 100 mg bid. - Continue spiro 12.5 mg daily.   - BP too low to titrate - Labs today - Repeat echo next visit.   2. Permanent Atrial Fibrillation  - failed multiple cardioversions and AAD therapy . - issues w/ Rapid Afib in the past, complicating HF - Rate control w/ metoprolol and digoxin.  - Followed by Dr. Curt Bears pending CRT-D. Can consdier AVN ablation if needed - On chronic Coumadin, INRs managed by PCP.   3. PE, bilateral small sub-segmental - new on CTA 5/23 2/2 sub-therapeutic INR - No DOAC due to interaction with phenobarb. - On Coumadin.   4. Pulmonary Hypertension  - Suspect combination WHO Group 2 and 3 - PFTs in 2018 showed moderate OAD. Also w/ OSA    5. OSA  - No longer wearing CPAP, was told he didn't need it as he sleeps in recliner.   6. HTN - Well-controlled.  7. COPD - Not on inhalers. - PFTs 2018 showed moderate obstructive disease. - Repeat PFTs  8. Medication non-compliance - Much improved - Continue paramedicine, appreciate their assistance.  Follow up in 4 months with Dr. Haroldine Laws with echo.    Rafael Bihari, FNP  11:13 AM

## 2022-01-24 NOTE — Patient Instructions (Addendum)
Thank you for coming in today  Labs were done today, if any labs are abnormal the clinic will call you No news is good news  You have been ordered for pulmonary function test and will be called to schedule   Your physician recommends that you schedule a follow-up appointment in:  4 months with Dr. Haroldine Laws with echocardiogram  Your physician has requested that you have an echocardiogram. Echocardiography is a painless test that uses sound waves to create images of your heart. It provides your doctor with information about the size and shape of your heart and how well your heart's chambers and valves are working. This procedure takes approximately one hour. There are no restrictions for this procedure.     Do the following things EVERYDAY: Weigh yourself in the morning before breakfast. Write it down and keep it in a log. Take your medicines as prescribed Eat low salt foods--Limit salt (sodium) to 2000 mg per day.  Stay as active as you can everyday Limit all fluids for the day to less than 2 liters  At the [facility], you and your health needs are our priority. As part of our continuing mission to provide you with exceptional heart care, we have created designated Provider Care Teams. These Care Teams include your primary Cardiologist (physician) and Advanced Practice Providers (APPs- Physician Assistants and Nurse Practitioners) who all work together to provide you with the care you need, when you need it.   You may see any of the following providers on your designated Care Team at your next follow up: Dr Glori Bickers Dr Loralie Champagne Dr. Roxana Hires, NP Lyda Jester, [place] [facility] [place], [place] Forestine Na, NP Audry Riles, PharmD   Please be sure to bring in all your medications bottles to every appointment.   If you have any questions or concerns before your next appointment please send [place] a message through [place] or call  our office at [phone].    TO LEAVE A MESSAGE FOR THE NURSE SELECT OPTION 2, PLEASE LEAVE A MESSAGE INCLUDING: YOUR NAME DATE OF BIRTH CALL BACK NUMBER REASON FOR CALL**this is important as we prioritize the call backs  YOU WILL RECEIVE A CALL BACK THE SAME DAY AS LONG AS YOU CALL BEFORE 4:00 PM

## 2022-01-24 NOTE — Patient Instructions (Signed)
Medication Instructions:  Your physician recommends that you continue on your current medications as directed. Please refer to the Current Medication list given to you today.  *If you need a refill on your cardiac medications before your next appointment, please call your pharmacy*   Lab Work: Pre procedure labs today: BMET & CBC  If you have labs (blood work) drawn today and your tests are completely normal, you will receive your results only by: [place] (if you have MyChart) OR A paper copy in the mail If you have any lab test that is abnormal or we need to change your treatment, we will call you to review the results.   Testing/Procedures: Your physician has recommended that you have a defibrillator inserted. An implantable cardioverter defibrillator (ICD) is a small device that is placed in your chest or, in rare cases, your abdomen. This device uses electrical pulses or shocks to help control life-threatening, irregular heartbeats that could lead the heart to suddenly stop beating (sudden cardiac arrest). Leads are attached to the ICD that goes into your heart. This is done in the hospital and usually requires an overnight stay. Please see the instruction sheet given to you today for more information.    Follow-Up: At [facility], you and your health needs are our priority.  As part of our continuing mission to provide you with exceptional heart care, we have created designated Provider Care Teams.  These Care Teams include your primary Cardiologist (physician) and Advanced Practice Providers (APPs -  Physician Assistants and Nurse Practitioners) who all work together to provide you with the care you need, when you need it.  We recommend signing up for the patient portal called "MyChart".  Sign up information is provided on this After Visit Summary.  MyChart is used to connect with patients for Virtual Visits (Telemedicine).  Patients are able to view lab/test results,  encounter notes, upcoming appointments, etc.  Non-urgent messages can be sent to your provider as well.   To learn more about what you can do with MyChart, go to [email or web address].    Your next appointment:   2 week(s)  The format for your next appointment:   In Person  Provider:   Device clinic for a wound check     Thank you for choosing [facility]!!   Trinidad Curet, RN [phone]  Other Instructions   Important Information About Sugar

## 2022-01-24 NOTE — H&P (View-Only) (Signed)
Electrophysiology Office Note   Date:  01/24/2022   ID:  Kaycee, Mcgaugh 01/21/1946, MRN [id number]  PCP:  [name], MD  Cardiologist:  [name] Primary Electrophysiologist:  Kevis Qu Meredith Leeds, MD    Chief Complaint: AF   History of Present Illness: SATORU MILICH is a 76 y.o. male who is being seen today for the evaluation of AF at the request of [name]. Presenting today for electrophysiology evaluation.  He has a history seen for obesity, hypertension, sleep apnea, seizures.  January 2020 was treated for community-acquired pneumonia and was found to be in atrial fibrillation.  He had a failed cardioversion.  Repeat cardioversion 11/04/2018 after being loaded on amiodarone.  He is currently on warfarin.  Most recently was admitted 05/24/2021 with atrial fibrillation which was difficult to control.  This is improved on both metoprolol and digoxin.  He was diuresed 19.7 L.  LFTs improved during hospitalization.  Today, denies symptoms of palpitations, chest pain, shortness of breath, orthopnea, PND, lower extremity edema, claudication, dizziness, presyncope, syncope, bleeding, or neurologic sequela. The patient is tolerating medications without difficulties.     Past Medical History:  Diagnosis Date   Acquired thrombophilia ([place]) 01/15/2019   Aortic atherosclerosis ([place])    Aortic root dilatation (HCC)    CAP (community acquired pneumonia) 03/06/2016   Chronic laryngitis 02/63/7858   Chronic systolic CHF (congestive heart failure) ([place])    COPD GOLD II  08/02/2016   Quit smoking 2000  PFT's  07/10/2016  FEV1 1.98 (70 % ) ratio 67  p 19 % improvement from saba p nothing  prior to study while of coreg so rec as of 08/02/2016 try off coreg and on bisoprolol      Essential hypertension 03/06/2016   Changed from coreg to bisoprolol due to copd with reversible component  08/02/2016 >>>    Hoarseness 08/31/2016   Referred to ent 08/31/2016 >>> seen 10/03/16 [name] dx gerd and  rhinitis medicamentosa   > improved on f/u 01/31/17 on gerd rx/ flonase and off afrin   Hyperlipidemia    Hypertension    Laryngopharyngeal reflux (LPR) 10/03/2016   Mitral regurgitation    Moderate persistent asthma 08/02/2016   Morbid (severe) obesity due to excess calories ([place]) 08/02/2016   Nasal polyps 10/03/2016   NICM (nonischemic cardiomyopathy) ([place])    a. EF 40-45% in 04/2016, etiology not defined, managed medically.   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   Rhinitis medicamentosa 10/03/2016   Right thyroid nodule 03/06/2016   Seizures ([place])    "take RX daily" (03/06/2016)   Sensorineural hearing loss (SNHL) of both ears 01/31/2017   Tricuspid regurgitation    Past Surgical History:  Procedure Laterality Date   CARDIOVERSION N/A 12/13/2017   Procedure: CARDIOVERSION;  Surgeon: [name], MD;  Location: [facility];  Service: Cardiovascular;  Laterality: N/A;   CARDIOVERSION N/A 09/25/2018   Procedure: CARDIOVERSION;  Surgeon: [name], MD;  Location: [facility] ENDOSCOPY;  Service: Cardiovascular;  Laterality: N/A;   CARDIOVERSION N/A 11/04/2018   Procedure: CARDIOVERSION;  Surgeon: [name], MD;  Location: [facility] ENDOSCOPY;  Service: Cardiovascular;  Laterality: N/A;   CARDIOVERSION N/A 04/11/2019   Procedure: CARDIOVERSION;  Surgeon: [name], MD;  Location: [facility] ENDOSCOPY;  Service: Cardiovascular;  Laterality: N/A;   CIRCUMCISION     JOINT REPLACEMENT  PERCUTANEOUS PINNING TOE FRACTURE Left    "big toe   REPLACEMENT TOTAL KNEE Left    RIGHT/LEFT HEART CATH AND CORONARY ANGIOGRAPHY N/A 11/06/2019   Procedure: RIGHT/LEFT HEART CATH AND CORONARY ANGIOGRAPHY;  Surgeon: [name], MD;  Location: [facility] CV [facility];  Service: Cardiovascular;  Laterality: N/A;   TONSILLECTOMY AND ADENOIDECTOMY       Current Outpatient Medications  Medication Sig  Dispense Refill   acetaminophen (TYLENOL) 500 MG tablet Take 1,000 mg by mouth at bedtime as needed for mild pain or headache.     busPIRone (BUSPAR) 5 MG tablet Take 10 mg by mouth daily.     Carboxymethylcellulose Sod PF 0.5 % SOLN Place 1 drop into both eyes 4 (four) times daily as needed (dry eyes).     Cholecalciferol (VITAMIN D3) 25 MCG (1000 UT) CAPS Take 1,000 Units by mouth daily.     digoxin (LANOXIN) 0.125 MG tablet Take 1 tablet (0.125 mg total) by mouth daily. 30 tablet 11   escitalopram (LEXAPRO) 20 MG tablet Take 20 mg by mouth daily. Takes at bedtime.     finasteride (PROSCAR) 5 MG tablet Take 1 tablet (5 mg total) by mouth daily at 12 noon. 90 tablet 1   fluticasone (FLONASE) 50 MCG/ACT nasal spray Place 1 spray into both nostrils 2 (two) times daily.     JARDIANCE 10 MG TABS tablet TAKE 1 TABLET BY MOUTH DAILY BEFORE BREAKFAST. 90 tablet 3   loratadine (CLARITIN) 10 MG tablet Take 10 mg by mouth daily.     losartan (COZAAR) 25 MG tablet TAKE 1/2 TABLET BY MOUTH EVERY DAY 45 tablet 3   metoprolol succinate (TOPROL-XL) 100 MG 24 hr tablet Take 1 tablet (100 mg total) by mouth 2 (two) times daily. Take with or immediately following a meal. 180 tablet 3   Multiple Vitamin (MULTIVITAMIN WITH MINERALS) TABS tablet Take 1 tablet by mouth daily at 12 noon.     PHENobarbital (LUMINAL) 97.2 MG tablet Take 97.2 mg by mouth daily. Takes between 3:00-5:00pm.     potassium chloride SA (KLOR-CON M) 20 MEQ tablet Take 1 tablet (20 mEq total) by mouth daily. 90 tablet 3   pravastatin (PRAVACHOL) 40 MG tablet Take 40 mg by mouth at bedtime.      spironolactone (ALDACTONE) 25 MG tablet Take 0.5 tablets (12.5 mg total) by mouth daily. 45 tablet 2   torsemide 60 MG TABS Take 60 mg by mouth daily. 30 tablet 1   triamcinolone cream (KENALOG) 0.1 % Apply 1 application  topically 2 (two) times daily as needed (rash).     warfarin (COUMADIN) 1 MG tablet Take 6 mg by mouth daily.     No current  facility-administered medications for this visit.    Allergies:   Dilaudid [hydromorphone hcl], Hydromorphone, Morphine sulfate, Tegretol [carbamazepine], and Carbamazepine   Social History:  The patient  reports that he quit smoking about 23 years ago. His smoking use included cigarettes. He has a 144.00 pack-year smoking history. He has never used smokeless tobacco. He reports that he does not drink alcohol and does not use drugs.   Family History:  The patient's family history includes Hypertension in his mother; Other in his father.   ROS:  Please see the history of present illness.   Otherwise, review of systems is positive for none.   All other systems are reviewed and negative.   PHYSICAL EXAM: VS:  BP 100/70 (BP Location: Left Arm, Patient Position: Sitting, Cuff  Size: Normal)   Pulse 79   Ht _0  (1.727 m)   Wt 235 lb (106.6 kg)   SpO2 96%   BMI 35.73 kg/m  , BMI Body mass index is 35.73 kg/m. GEN: Well nourished, well developed, in no acute distress  HEENT: normal  Neck: no JVD, carotid bruits, or masses Cardiac: RRR; no murmurs, rubs, or gallops,no edema  Respiratory:  clear to auscultation bilaterally, normal work of breathing GI: soft, nontender, nondistended, + BS MS: no deformity or atrophy  Skin: warm and dry Neuro:  Strength and sensation are intact Psych: euthymic mood, full affect  EKG:  EKG is ordered today. Personal review of the ekg ordered shows AF, RBBB   Recent Labs: 04/19/2021: TSH 1.898 08/01/2021: Magnesium 2.4 10/24/2021: ALT 26; B Natriuretic Peptide 222.7; BUN 24; Creatinine, Ser 1.18; Hemoglobin 14.9; Platelets 205; Potassium 4.0; Sodium 138    Lipid Panel     Component Value Date/Time   CHOL 110 07/30/2021 0256   TRIG 67 07/30/2021 0256   HDL 38 (L) 07/30/2021 0256   CHOLHDL 2.9 07/30/2021 0256   VLDL 13 07/30/2021 0256   LDLCALC 59 07/30/2021 0256     Wt Readings from Last 3 Encounters:  01/24/22 235 lb (106.6 kg)  01/18/22 230 lb  (104.3 kg)  12/28/21 231 lb (104.8 kg)      Other studies Reviewed: Additional studies/ records that were reviewed today include: TTE 04/20/21 Review of the above records today demonstrates:   1. Left ventricular ejection fraction, by estimation, is 20 to 25%. The  left ventricle has severely decreased function. The left ventricle  demonstrates global hypokinesis. The left ventricular internal cavity size  was moderately dilated. Left  ventricular diastolic parameters are indeterminate.   2. Right ventricular systolic function is mildly reduced. The right  ventricular size is mildly enlarged. There is moderately elevated  pulmonary artery systolic pressure. The estimated right ventricular  systolic pressure is 13.0 mmHg.   3. Left atrial size was moderately dilated.   4. Right atrial size was moderately dilated.   5. The mitral valve is normal in structure. Mild mitral valve  regurgitation. No evidence of mitral stenosis.   6. Tricuspid valve regurgitation is mild to moderate.   7. The aortic valve is tricuspid. Aortic valve regurgitation is mild. No  aortic stenosis is present.   8. Aortic dilatation noted. There is mild dilatation of the aortic root,  measuring 39 mm.   9. The inferior vena cava is dilated in size with <50% respiratory  variability, suggesting right atrial pressure of 15 mmHg.    ASSESSMENT AND PLAN:  1.  Permanent atrial fibrillation: Currently on digoxin 0.125 mg daily, metoprolol 100 mg twice daily, warfarin.  CHA2DS2-VASc of 4.  Well rate controlled today.  2.  Obstructive sleep apnea: CPAP compliance encouraged  3.  Chronic systolic heart failure: Currently on Aldactone 12.5 mg daily, metoprolol 100 mg twice daily.  Blood pressure is low and thus not able to tolerate alternative medications.  Plan for CRT-D implant.  We Shena Vinluan implant a coronary sinus lead in case his atrial fibrillation becomes rapid and we need AV node ablation.  Risk and benefits of been  discussed.  This include bleeding, tamponade, infection, pneumothorax, lead dislodgment.  He understands these risks and is agreed to the procedure.  4.  Second hypercoagulable state: Currently on warfarin for atrial fibrillation as above   Current medicines are reviewed at length with the patient today.   The  patient does not have concerns regarding his medicines.  The following changes were made today: None  Labs/ tests ordered today include:  Orders Placed This Encounter  Procedures   EKG 12-Lead     Disposition:   FU with Delaina Fetsch 3 months  Signed, Marilyn Wing Meredith Leeds, MD  01/24/2022 10:59 AM     [facility] [address] [place] [address] [phone] (office) [phone] (fax)

## 2022-01-27 ENCOUNTER — Emergency Department (HOSPITAL_COMMUNITY): Payer: Medicare PPO

## 2022-01-27 ENCOUNTER — Other Ambulatory Visit: Payer: Self-pay

## 2022-01-27 ENCOUNTER — Encounter (HOSPITAL_COMMUNITY): Payer: Self-pay | Admitting: Emergency Medicine

## 2022-01-27 ENCOUNTER — Emergency Department (HOSPITAL_COMMUNITY)
Admission: EM | Admit: 2022-01-27 | Discharge: 2022-01-27 | Disposition: A | Discharge status: Home / Self Care | Payer: Medicare PPO | Attending: Emergency Medicine | Admitting: Emergency Medicine

## 2022-01-27 DIAGNOSIS — I509 Heart failure, unspecified: Secondary | ICD-10-CM | POA: Insufficient documentation

## 2022-01-27 DIAGNOSIS — I4891 Unspecified atrial fibrillation: Secondary | ICD-10-CM | POA: Diagnosis not present

## 2022-01-27 DIAGNOSIS — Z7901 Long term (current) use of anticoagulants: Secondary | ICD-10-CM | POA: Diagnosis not present

## 2022-01-27 DIAGNOSIS — R531 Weakness: Secondary | ICD-10-CM

## 2022-01-27 DIAGNOSIS — R791 Abnormal coagulation profile: Secondary | ICD-10-CM | POA: Diagnosis not present

## 2022-01-27 DIAGNOSIS — I4510 Unspecified right bundle-branch block: Secondary | ICD-10-CM | POA: Diagnosis not present

## 2022-01-27 DIAGNOSIS — R42 Dizziness and giddiness: Secondary | ICD-10-CM | POA: Diagnosis not present

## 2022-01-27 DIAGNOSIS — R0602 Shortness of breath: Secondary | ICD-10-CM | POA: Diagnosis not present

## 2022-01-27 DIAGNOSIS — I959 Hypotension, unspecified: Secondary | ICD-10-CM | POA: Diagnosis not present

## 2022-01-27 DIAGNOSIS — Z7984 Long term (current) use of oral hypoglycemic drugs: Secondary | ICD-10-CM | POA: Insufficient documentation

## 2022-01-27 LAB — CBC WITH DIFFERENTIAL/PLATELET
Abs Immature Granulocytes: 0.02 10*3/uL (ref 0.00–0.07)
Basophils Absolute: 0 10*3/uL (ref 0.0–0.1)
Basophils Relative: 0 %
Eosinophils Absolute: 0.2 10*3/uL (ref 0.0–0.5)
Eosinophils Relative: 3 %
HCT: 47.6 % (ref 39.0–52.0)
Hemoglobin: 15.2 g/dL (ref 13.0–17.0)
Immature Granulocytes: 0 %
Lymphocytes Relative: 30 %
Lymphs Abs: 2.2 10*3/uL (ref 0.7–4.0)
MCH: 31.8 pg (ref 26.0–34.0)
MCHC: 31.9 g/dL (ref 30.0–36.0)
MCV: 99.6 fL (ref 80.0–100.0)
Monocytes Absolute: 0.6 10*3/uL (ref 0.1–1.0)
Monocytes Relative: 8 %
Neutro Abs: 4.3 10*3/uL (ref 1.7–7.7)
Neutrophils Relative %: 59 %
Platelets: 167 10*3/uL (ref 150–400)
RBC: 4.78 MIL/uL (ref 4.22–5.81)
RDW: 14.1 % (ref 11.5–15.5)
WBC: 7.3 10*3/uL (ref 4.0–10.5)
nRBC: 0 % (ref 0.0–0.2)

## 2022-01-27 LAB — COMPREHENSIVE METABOLIC PANEL
ALT: 25 U/L (ref 0–44)
AST: 28 U/L (ref 15–41)
Albumin: 4.1 g/dL (ref 3.5–5.0)
Alkaline Phosphatase: 121 U/L (ref 38–126)
Anion gap: 7 (ref 5–15)
BUN: 21 mg/dL (ref 8–23)
CO2: 30 mmol/L (ref 22–32)
Calcium: 8.8 mg/dL — ABNORMAL LOW (ref 8.9–10.3)
Chloride: 102 mmol/L (ref 98–111)
Creatinine, Ser: 1.02 mg/dL (ref 0.61–1.24)
GFR, Estimated: >60 mL/min (ref >60)
Glucose, Bld: 93 mg/dL (ref 70–99)
Potassium: 3.7 mmol/L (ref 3.5–5.1)
Sodium: 139 mmol/L (ref 135–145)
Total Bilirubin: 0.4 mg/dL (ref 0.3–1.2)
Total Protein: 8 g/dL (ref 6.5–8.1)

## 2022-01-27 LAB — URINALYSIS, ROUTINE W REFLEX MICROSCOPIC
Bacteria, UA: NONE SEEN
Bilirubin Urine: NEGATIVE
Glucose, UA: >=500 mg/dL — AB
Hgb urine dipstick: NEGATIVE
Ketones, ur: NEGATIVE mg/dL
Leukocytes,Ua: NEGATIVE
Nitrite: NEGATIVE
Protein, ur: NEGATIVE mg/dL
Specific Gravity, Urine: 1.012 (ref 1.005–1.030)
pH: 5 (ref 5.0–8.0)

## 2022-01-27 LAB — TROPONIN I (HIGH SENSITIVITY)
Troponin I (High Sensitivity): 5 ng/L (ref <18)
Troponin I (High Sensitivity): 5 ng/L (ref <18)

## 2022-01-27 LAB — PROTIME-INR
INR: 4.7 (ref 0.8–1.2)
Prothrombin Time: 43.9 seconds — ABNORMAL HIGH (ref 11.4–15.2)

## 2022-01-27 LAB — BRAIN NATRIURETIC PEPTIDE: B Natriuretic Peptide: 205.3 pg/mL — ABNORMAL HIGH (ref 0.0–100.0)

## 2022-01-27 LAB — DIGOXIN LEVEL: Digoxin Level: 0.6 ng/mL — ABNORMAL LOW (ref 0.8–2.0)

## 2022-01-27 NOTE — ED Triage Notes (Signed)
Pt  has history of COPD, Heart Failure states he feels weaker today. Has appointment for pacemaker checked Dec 1.

## 2022-01-27 NOTE — ED Provider Notes (Signed)
[facility] DEPT Provider Note   CSN: [id number] Arrival date & time: 01/27/22  1407     History  Chief Complaint  Patient presents with   Weakness   Dizziness    Ruben Murray is a 76 y.o. male.  76 year old male presents with generalized weakness for about a month.  States that is progressively getting worse.  It is nonfocal in nature.  States that walking short distances makes him feel more short of breath.  Does have history of CHF but denies any chest pain or chest pressure no fever chills.  Is on Coumadin for history of A-fib but denies any palpitations or GI bleed symptoms.  Patient states that he was very active yesterday and feels he may have overdone it.  Has been compliant with his medications.       Home Medications Prior to Admission medications   Medication Sig Start Date End Date Taking? Authorizing Provider  acetaminophen (TYLENOL) 500 MG tablet Take 1,000 mg by mouth at bedtime as needed for mild pain or headache.    [provider]  busPIRone (BUSPAR) 5 MG tablet Take 10 mg by mouth daily. 10/26/21   [provider]  Carboxymethylcellulose Sod PF 0.5 % SOLN Place 1 drop into both eyes 4 (four) times daily as needed (dry eyes).    [provider]  Cholecalciferol (VITAMIN D3) 25 MCG (1000 UT) CAPS Take 1,000 Units by mouth daily.    [provider]  digoxin (LANOXIN) 0.125 MG tablet Take 1 tablet (0.125 mg total) by mouth daily. 12/14/21   Bensimhon, Shaune Pascal, MD  escitalopram (LEXAPRO) 20 MG tablet Take 20 mg by mouth daily. Takes at bedtime.    [provider]  finasteride (PROSCAR) 5 MG tablet Take 1 tablet (5 mg total) by mouth daily at 12 noon. 12/29/21   Bensimhon, Shaune Pascal, MD  fluticasone (FLONASE) 50 MCG/ACT nasal spray Place 1 spray into both nostrils 2 (two) times daily. 10/24/17   [provider]  JARDIANCE 10 MG TABS tablet TAKE 1 TABLET BY MOUTH DAILY BEFORE  BREAKFAST. 11/17/21   Bensimhon, Shaune Pascal, MD  loratadine (CLARITIN) 10 MG tablet Take 10 mg by mouth daily.    [provider]  losartan (COZAAR) 25 MG tablet TAKE 1/2 TABLET BY MOUTH EVERY DAY 11/03/21   Bensimhon, Shaune Pascal, MD  metoprolol succinate (TOPROL-XL) 100 MG 24 hr tablet Take 1 tablet (100 mg total) by mouth 2 (two) times daily. Take with or immediately following a meal. 10/24/21   Bensimhon, Shaune Pascal, MD  Multiple Vitamin (MULTIVITAMIN WITH MINERALS) TABS tablet Take 1 tablet by mouth daily at 12 noon.    [provider]  PHENobarbital (LUMINAL) 97.2 MG tablet Take 97.2 mg by mouth daily. Takes between 3:00-5:00pm.    [provider]  potassium chloride SA (KLOR-CON M) 20 MEQ tablet Take 1 tablet (20 mEq total) by mouth daily. 12/21/21   Bensimhon, Shaune Pascal, MD  pravastatin (PRAVACHOL) 40 MG tablet Take 40 mg by mouth at bedtime.     [provider]  spironolactone (ALDACTONE) 25 MG tablet Take 0.5 tablets (12.5 mg total) by mouth daily. 06/10/21   Clegg, Amy D, NP  torsemide 60 MG TABS Take 60 mg by mouth daily. 08/02/21 10/25/22  Shawna Clamp, MD  triamcinolone cream (KENALOG) 0.1 % Apply 1 application  topically 2 (two) times daily as needed (rash). 05/13/21   [provider]  warfarin (COUMADIN) 1 MG tablet Take  6 mg by mouth daily. 10/12/21         Allergies    Dilaudid [hydromorphone hcl], Hydromorphone, Morphine sulfate, Tegretol [carbamazepine], and Carbamazepine    Review of Systems   Review of Systems  All other systems reviewed and are negative.   Physical Exam Updated Vital Signs BP 107/61 (BP Location: Left Arm)   Pulse 64   Temp 98.4 F (36.9 C) (Oral)   Resp 16   Ht 1.727 m (_0 )   Wt 99.8 kg   SpO2 98%   BMI 33.45 kg/m  Physical Exam Vitals and nursing note reviewed.  Constitutional:      General: He is not in acute distress.    Appearance: Normal appearance. He is well-developed. He is not toxic-appearing.   HENT:     Head: Normocephalic and atraumatic.  Eyes:     General: Lids are normal.     Conjunctiva/sclera: Conjunctivae normal.     Pupils: Pupils are equal, round, and reactive to light.  Neck:     Thyroid: No thyroid mass.     Trachea: No tracheal deviation.  Cardiovascular:     Rate and Rhythm: Normal rate and regular rhythm.     Heart sounds: Normal heart sounds. No murmur heard.    No gallop.  Pulmonary:     Effort: Pulmonary effort is normal. No respiratory distress.     Breath sounds: Normal breath sounds. No stridor. No decreased breath sounds, wheezing, rhonchi or rales.  Abdominal:     General: There is no distension.     Palpations: Abdomen is soft.     Tenderness: There is no abdominal tenderness. There is no rebound.  Musculoskeletal:        General: No tenderness. Normal range of motion.     Cervical back: Normal range of motion and neck supple.  Skin:    General: Skin is warm and dry.     Findings: No abrasion or rash.  Neurological:     General: No focal deficit present.     Mental Status: He is alert and oriented to person, place, and time. Mental status is at baseline.     GCS: GCS eye subscore is 4. GCS verbal subscore is 5. GCS motor subscore is 6.     Cranial Nerves: No cranial nerve deficit.     Sensory: No sensory deficit.     Motor: Motor function is intact.  Psychiatric:        Attention and Perception: Attention normal.        Speech: Speech normal.        Behavior: Behavior normal.     ED Results / Procedures / Treatments   Labs (all labs ordered are listed, but only abnormal results are displayed) Labs Reviewed  COMPREHENSIVE METABOLIC PANEL  CBC WITH DIFFERENTIAL/PLATELET  URINALYSIS, ROUTINE W REFLEX MICROSCOPIC  BRAIN NATRIURETIC PEPTIDE  TROPONIN I (HIGH SENSITIVITY)    EKG None ED ECG REPORT   Date: 01/27/2022  Rate: 80  Rhythm: atrial fibrillation  QRS Axis: normal  Intervals: normal  ST/T Wave abnormalities: nonspecific  ST changes  Conduction Disutrbances:none  Narrative Interpretation:   Old EKG Reviewed: unchanged  I have personally reviewed the EKG tracing and agree with the computerized printout as noted.  Radiology No results found.  Procedures Procedures    Medications Ordered in ED Medications - No data to display  ED Course/ Medical Decision Making/ A&P  Medical Decision Making Amount and/or Complexity of Data Reviewed Labs: ordered.   Patient presented with general lysed weakness.  No focality was noted.  Had a head CT but do not feel that is going to add any value.  Concern for possible CHF as he has been experiencing dyspnea on exertion.  His workup for that was negative here.  There is no signs of anemia to explain his weakness.  Hemoglobin is stable.  Urinalysis is negative.  He does have an elevated INR at 4.7.  Will be told to hold his Coumadin for 2 days.  He is in chronic A-fib at this patient's EKG per interpretation is unchanged from prior.   Patient has stable neurological exam.  Plan will be for patient to be discharged       Final Clinical Impression(s) / ED Diagnoses Final diagnoses:  None    Rx / DC Orders ED Discharge Orders     None         Lacretia Leigh, MD 01/27/22 1828

## 2022-01-27 NOTE — ED Triage Notes (Signed)
Per EMS- Patient c/o weakness and dizziness since yesterday and states it is getting progressively worse. Patient states he is only able to walk short distances today which is not his norma.

## 2022-01-30 ENCOUNTER — Encounter: Payer: Medicare PPO | Admitting: Cardiology

## 2022-01-31 ENCOUNTER — Other Ambulatory Visit (HOSPITAL_COMMUNITY): Payer: Self-pay | Admitting: *Deleted

## 2022-01-31 ENCOUNTER — Other Ambulatory Visit (HOSPITAL_COMMUNITY): Payer: Self-pay

## 2022-01-31 MED ORDER — METOPROLOL SUCCINATE ER 100 MG PO TB24: 100.0000 mg | ORAL_TABLET | Freq: Two times a day (BID) | ORAL | 3 refills | Status: DC

## 2022-01-31 NOTE — Progress Notes (Signed)
Paramedicine Encounter    Patient ID: Ruben Murray, male    DOB: Apr 11, 1945, 76 y.o.   MRN: [id number]  Arrived for home visit for Landrum who was seated in his living room alert and oriented reporting to be feeling okay. He denied any chest pain, shortness of breath, dizziness, increased swelling or weight gain. He has been compliant with medications over the last week. He states that last week he went to the ER due to increased weakness after caring for his wife who has had declining health issues. Hospice is helping with respit care for his wife weekly but, he says when she is home is is causing him a lot of stress physically and mentally. He reports his niece and friend are looking at possible options for long term care facilities. He says the exterminators are coming for final bed bug treatment on Friday.    Today I obtained vitals and assessment.   WT- 223lbs BP- 96/62 HR- 82 O2- 95% RR- 16 Lungs- clear Edema- none    I reviewed meds and confirmed same including warfarin dosing with upcoming procedure. Pill box filled for one week. I plan to see Ruben Murray in the home next Wednesday. He agreed with plan. Upcoming procedure instructions reviewed. Home visit complete.    Refills: Metoprolol     Patient Care Team: [name], MD as PCP - General (Family Medicine) [name], MD as PCP - Cardiology (Cardiology) [name], MD as PCP - Electrophysiology (Cardiology) [name], MD (Family Medicine) [place], [place] [employer] as Case Manager [facility] and Palliative Medicine)  Patient Active Problem List   Diagnosis Date Noted   Atrial fibrillation [facility]) 11/02/2021   Long term (current) use of anticoagulants 11/02/2021   AKI (acute kidney injury) ([place]) 07/27/2021   Acute pulmonary embolism ([place]) 07/26/2021   Acute on chronic systolic CHF (congestive heart failure) ([place]) 05/17/2021   Hypercoagulable state ([place]) 05/10/2021   Left leg pain 04/24/2021   OSA  on CPAP 04/24/2021   Mitral regurgitation 04/24/2021   Tricuspid regurgitation 04/24/2021   Dilated aortic root ([place]) 04/24/2021   Permanent atrial fibrillation ([place]) 04/19/2021   Chronic arthropathy 04/13/2020   Callus 04/13/2020   Onychomycosis 12/12/2019   Left leg cellulitis 73/42/8768   Chronic systolic heart failure ([place]) 12/09/2019   Dilated cardiomyopathy ([place])    Atrial flutter ([place]) 11/03/2019   Atrial flutter with rapid ventricular response ([place]) 11/03/2019   Acquired thrombophilia ([place]) 01/15/2019   Atrial fibrillation with rapid ventricular response ([place]) 10/29/2017   Chronic combined systolic and diastolic heart failure ([place]) 10/29/2017   Prolonged QT interval 10/29/2017   Sensorineural hearing loss (SNHL) of both ears 01/31/2017   Dysphonia 10/03/2016   Laryngopharyngeal reflux (LPR) 10/03/2016   Nasal polyps 10/03/2016   Rhinitis medicamentosa 10/03/2016   Chronic laryngitis 10/03/2016   Hoarseness 08/31/2016   Moderate persistent asthma 08/02/2016   COPD (chronic obstructive pulmonary disease) ([place]) 08/02/2016   Morbid (severe) obesity due to excess calories ([place]) 08/02/2016   Community acquired pneumonia 03/06/2016   Essential hypertension 03/06/2016   Mixed hyperlipidemia 03/06/2016   Right thyroid nodule 03/06/2016   Seizures ([place]) 03/06/2016   Fever 03/12/2015   Recurrent erosion of cornea 06/23/2011    Current Outpatient Medications:    acetaminophen (TYLENOL) 500 MG tablet, Take 1,000 mg by mouth at bedtime as needed for mild pain or headache., Disp: , Rfl:    busPIRone (BUSPAR) 5 MG tablet, Take 10 mg by mouth daily., Disp: ,  Rfl:    Carboxymethylcellulose Sod PF 0.5 % SOLN, Place 1 drop into both eyes 4 (four) times daily as needed (dry eyes)., Disp: , Rfl:    Cholecalciferol (VITAMIN D3) 25 MCG (1000 UT) CAPS, Take 1,000 Units by mouth daily., Disp: , Rfl:    digoxin (LANOXIN) 0.125 MG tablet, Take 1 tablet (0.125 mg total) by mouth daily., Disp: 30  tablet, Rfl: 11   escitalopram (LEXAPRO) 20 MG tablet, Take 20 mg by mouth daily. Takes at bedtime., Disp: , Rfl:    finasteride (PROSCAR) 5 MG tablet, Take 1 tablet (5 mg total) by mouth daily at 12 noon., Disp: 90 tablet, Rfl: 1   fluticasone (FLONASE) 50 MCG/ACT nasal spray, Place 1 spray into both nostrils 2 (two) times daily., Disp: , Rfl:    JARDIANCE 10 MG TABS tablet, TAKE 1 TABLET BY MOUTH DAILY BEFORE BREAKFAST., Disp: 90 tablet, Rfl: 3   loratadine (CLARITIN) 10 MG tablet, Take 10 mg by mouth daily., Disp: , Rfl:    losartan (COZAAR) 25 MG tablet, TAKE 1/2 TABLET BY MOUTH EVERY DAY, Disp: 45 tablet, Rfl: 3   metoprolol succinate (TOPROL-XL) 100 MG 24 hr tablet, Take 1 tablet (100 mg total) by mouth 2 (two) times daily. Take with or immediately following a meal., Disp: 180 tablet, Rfl: 3   Multiple Vitamin (MULTIVITAMIN WITH MINERALS) TABS tablet, Take 1 tablet by mouth daily at 12 noon., Disp: , Rfl:    PHENobarbital (LUMINAL) 97.2 MG tablet, Take 97.2 mg by mouth daily. Takes between 3:00-5:00pm., Disp: , Rfl:    potassium chloride SA (KLOR-CON M) 20 MEQ tablet, Take 1 tablet (20 mEq total) by mouth daily., Disp: 90 tablet, Rfl: 3   pravastatin (PRAVACHOL) 40 MG tablet, Take 40 mg by mouth at bedtime. , Disp: , Rfl:    spironolactone (ALDACTONE) 25 MG tablet, Take 0.5 tablets (12.5 mg total) by mouth daily., Disp: 45 tablet, Rfl: 2   torsemide 60 MG TABS, Take 60 mg by mouth daily., Disp: 30 tablet, Rfl: 1   triamcinolone cream (KENALOG) 0.1 %, Apply 1 application  topically 2 (two) times daily as needed (rash)., Disp: , Rfl:    warfarin (COUMADIN) 1 MG tablet, Take 6 mg by mouth daily., Disp: , Rfl:  Allergies  Allergen Reactions   Dilaudid [Hydromorphone Hcl] Other (See Comments)    Makes pt hyper    Hydromorphone Other (See Comments)    hyperactiviity Other reaction(s): made him wild Other reaction(s): Unknown   Morphine Sulfate     Other reaction(s): at high dose causes  confusion Other reaction(s): at high dose causes confusion Other reaction(s): at high dose causes confusion, Other (See Comments) Other reaction(s): at high dose causes confusion Other reaction(s): at high dose causes confusion    Tegretol [Carbamazepine] Hives   Carbamazepine Hives, Rash and Other (See Comments)    Other reaction(s): hives Other reaction(s): Hives Other reaction(s): hives     Social History   Socioeconomic History   Marital status: Married    Spouse name: Not on file   Number of children: Not on file   Years of education: Not on file   Highest education level: Not on file  Occupational History   Not on file  Tobacco Use   Smoking status: Former    Packs/day: 3.00    Years: 48.00    Total pack years: 144.00    Types: Cigarettes    Quit date: 2000    Years since quitting: 23.9  Smokeless tobacco: Never  Vaping Use   Vaping Use: Never used  Substance and Sexual Activity   Alcohol use: Never   Drug use: Never   Sexual activity: Never  Other Topics Concern   Not on file  Social History Narrative   ** Merged History Encounter **       Social Determinants of Health   Financial Resource Strain: Medium Risk (10/26/2021)   Overall Financial Resource Strain (CARDIA)    Difficulty of Paying Living Expenses: Somewhat hard  Food Insecurity: No Food Insecurity (10/26/2021)   Hunger Vital Sign    Worried About Running Out of Food in the Last Year: Never true    Ran Out of Food in the Last Year: Never true  Transportation Needs: Unmet Transportation Needs (10/26/2021)   PRAPARE - Hydrologist (Medical): Yes    Lack of Transportation (Non-Medical): No  Physical Activity: Inactive (12/15/2019)   Exercise Vital Sign    Days of Exercise per Week: 0 days    Minutes of Exercise per Session: 0 min  Stress: Stress Concern Present (09/26/2018)   [place]    Feeling of  Stress : To some extent  Social Connections: Not on file  Intimate Partner Violence: Not on file    Physical Exam      Future Appointments  Date Time Provider [place]  02/08/2022  1:30 PM [facility] None  05/17/2022  2:00 PM [name], MD [facility]     ACTION: Home visit completed

## 2022-02-02 ENCOUNTER — Encounter: Payer: Medicare PPO | Admitting: Cardiology

## 2022-02-02 NOTE — Pre-Procedure Instructions (Signed)
Attempted to call patient regarding procedure instructions.  No answer will try again later.  Also need to inform patient of time change of procedure.

## 2022-02-03 ENCOUNTER — Other Ambulatory Visit: Payer: Self-pay

## 2022-02-03 ENCOUNTER — Encounter (HOSPITAL_COMMUNITY)
Admission: RE | Disposition: A | Discharge status: Home / Self Care | Payer: Self-pay | Source: Home / Self Care | Attending: Cardiology

## 2022-02-03 ENCOUNTER — Ambulatory Visit (HOSPITAL_COMMUNITY)
Admission: RE | Admit: 2022-02-03 | Discharge: 2022-02-04 | Disposition: A | Discharge status: Home / Self Care | Payer: Medicare PPO | Attending: Cardiology | Admitting: Cardiology

## 2022-02-03 DIAGNOSIS — Z79899 Other long term (current) drug therapy: Secondary | ICD-10-CM | POA: Diagnosis not present

## 2022-02-03 DIAGNOSIS — I452 Bifascicular block: Secondary | ICD-10-CM | POA: Diagnosis not present

## 2022-02-03 DIAGNOSIS — I509 Heart failure, unspecified: Secondary | ICD-10-CM

## 2022-02-03 DIAGNOSIS — D6859 Other primary thrombophilia: Secondary | ICD-10-CM | POA: Insufficient documentation

## 2022-02-03 DIAGNOSIS — I5022 Chronic systolic (congestive) heart failure: Secondary | ICD-10-CM | POA: Diagnosis not present

## 2022-02-03 DIAGNOSIS — I428 Other cardiomyopathies: Secondary | ICD-10-CM | POA: Diagnosis not present

## 2022-02-03 DIAGNOSIS — Z7901 Long term (current) use of anticoagulants: Secondary | ICD-10-CM | POA: Diagnosis not present

## 2022-02-03 DIAGNOSIS — I110 Hypertensive heart disease with heart failure: Secondary | ICD-10-CM | POA: Insufficient documentation

## 2022-02-03 DIAGNOSIS — I429 Cardiomyopathy, unspecified: Secondary | ICD-10-CM | POA: Diagnosis not present

## 2022-02-03 DIAGNOSIS — I4821 Permanent atrial fibrillation: Secondary | ICD-10-CM | POA: Diagnosis not present

## 2022-02-03 DIAGNOSIS — Z87891 Personal history of nicotine dependence: Secondary | ICD-10-CM | POA: Insufficient documentation

## 2022-02-03 DIAGNOSIS — G4733 Obstructive sleep apnea (adult) (pediatric): Secondary | ICD-10-CM | POA: Insufficient documentation

## 2022-02-03 HISTORY — PX: BIV ICD INSERTION CRT-D: EP1195

## 2022-02-03 LAB — PROTIME-INR
INR: 2.1 — ABNORMAL HIGH (ref 0.8–1.2)
Prothrombin Time: 23.5 seconds — ABNORMAL HIGH (ref 11.4–15.2)

## 2022-02-03 SURGERY — BIV ICD INSERTION CRT-D

## 2022-02-03 MED ORDER — SODIUM CHLORIDE 0.9 % IV SOLN
INTRAVENOUS | Status: AC
  Filled 2022-02-03: qty 2

## 2022-02-03 MED ORDER — FLUTICASONE PROPIONATE 50 MCG/ACT NA SUSP
1.0000 | Freq: Two times a day (BID) | NASAL | Status: DC
  Administered 2022-02-03 – 2022-02-04 (×2): 1 via NASAL
  Filled 2022-02-03: qty 16

## 2022-02-03 MED ORDER — WARFARIN - PHYSICIAN DOSING INPATIENT: Freq: Every day | Status: DC

## 2022-02-03 MED ORDER — VITAMIN D 25 MCG (1000 UNIT) PO TABS
1000.0000 [IU] | ORAL_TABLET | Freq: Every day | ORAL | Status: DC
  Administered 2022-02-03: 1000 [IU] via ORAL
  Filled 2022-02-03: qty 1

## 2022-02-03 MED ORDER — CEFAZOLIN SODIUM-DEXTROSE 2-4 GM/100ML-% IV SOLN
INTRAVENOUS | Status: AC
  Filled 2022-02-03: qty 100

## 2022-02-03 MED ORDER — LORATADINE 10 MG PO TABS
10.0000 mg | ORAL_TABLET | Freq: Every morning | ORAL | Status: DC
  Administered 2022-02-04: 10 mg via ORAL
  Filled 2022-02-03: qty 1

## 2022-02-03 MED ORDER — LIDOCAINE HCL (PF) 1 % IJ SOLN
INTRAMUSCULAR | Status: DC | PRN
  Administered 2022-02-03: 60 mL

## 2022-02-03 MED ORDER — IOHEXOL 350 MG/ML SOLN
INTRAVENOUS | Status: DC | PRN
  Administered 2022-02-03: 5 mL

## 2022-02-03 MED ORDER — CEFAZOLIN SODIUM-DEXTROSE 1-4 GM/50ML-% IV SOLN
1.0000 g | Freq: Four times a day (QID) | INTRAVENOUS | Status: AC
  Administered 2022-02-03 – 2022-02-04 (×3): 1 g via INTRAVENOUS
  Filled 2022-02-03 (×3): qty 50

## 2022-02-03 MED ORDER — FENTANYL CITRATE (PF) 100 MCG/2ML IJ SOLN
INTRAMUSCULAR | Status: DC | PRN
  Administered 2022-02-03: 25 ug via INTRAVENOUS

## 2022-02-03 MED ORDER — MIDAZOLAM HCL 5 MG/5ML IJ SOLN
INTRAMUSCULAR | Status: AC
  Filled 2022-02-03: qty 5

## 2022-02-03 MED ORDER — FINASTERIDE 5 MG PO TABS: 5.0000 mg | ORAL_TABLET | Freq: Every day | ORAL | Status: DC

## 2022-02-03 MED ORDER — METOPROLOL SUCCINATE ER 100 MG PO TB24
100.0000 mg | ORAL_TABLET | Freq: Two times a day (BID) | ORAL | Status: DC
  Administered 2022-02-03 – 2022-02-04 (×2): 100 mg via ORAL
  Filled 2022-02-03 (×2): qty 1

## 2022-02-03 MED ORDER — ACETAMINOPHEN 500 MG PO TABS
1000.0000 mg | ORAL_TABLET | Freq: Every day | ORAL | Status: DC
  Administered 2022-02-03: 1000 mg via ORAL
  Filled 2022-02-03: qty 2

## 2022-02-03 MED ORDER — HEPARIN (PORCINE) IN NACL 1000-0.9 UT/500ML-% IV SOLN
INTRAVENOUS | Status: DC | PRN
  Administered 2022-02-03: 500 mL

## 2022-02-03 MED ORDER — FENTANYL CITRATE (PF) 100 MCG/2ML IJ SOLN
INTRAMUSCULAR | Status: AC
  Filled 2022-02-03: qty 2

## 2022-02-03 MED ORDER — ESCITALOPRAM OXALATE 10 MG PO TABS
20.0000 mg | ORAL_TABLET | Freq: Every day | ORAL | Status: DC
  Administered 2022-02-03: 20 mg via ORAL
  Filled 2022-02-03: qty 2

## 2022-02-03 MED ORDER — LIDOCAINE HCL 1 % IJ SOLN
INTRAMUSCULAR | Status: AC
  Filled 2022-02-03: qty 20

## 2022-02-03 MED ORDER — SODIUM CHLORIDE 0.9 % IV SOLN: INTRAVENOUS | Status: DC

## 2022-02-03 MED ORDER — ORAL CARE MOUTH RINSE: 15.0000 mL | OROMUCOSAL | Status: DC | PRN

## 2022-02-03 MED ORDER — SPIRONOLACTONE 12.5 MG HALF TABLET
12.5000 mg | ORAL_TABLET | Freq: Every day | ORAL | Status: DC
  Administered 2022-02-03 – 2022-02-04 (×2): 12.5 mg via ORAL
  Filled 2022-02-03 (×2): qty 1

## 2022-02-03 MED ORDER — BUSPIRONE HCL 10 MG PO TABS
5.0000 mg | ORAL_TABLET | Freq: Two times a day (BID) | ORAL | Status: DC
  Administered 2022-02-03 – 2022-02-04 (×2): 5 mg via ORAL
  Filled 2022-02-03 (×2): qty 1

## 2022-02-03 MED ORDER — POTASSIUM CHLORIDE CRYS ER 20 MEQ PO TBCR
20.0000 meq | EXTENDED_RELEASE_TABLET | Freq: Every day | ORAL | Status: DC
  Administered 2022-02-03: 20 meq via ORAL
  Filled 2022-02-03: qty 1

## 2022-02-03 MED ORDER — ACETAMINOPHEN 325 MG PO TABS: 325.0000 mg | ORAL_TABLET | ORAL | Status: DC | PRN

## 2022-02-03 MED ORDER — POLYVINYL ALCOHOL 1.4 % OP SOLN: 1.0000 [drp] | Freq: Every day | OPHTHALMIC | Status: DC | PRN

## 2022-02-03 MED ORDER — EMPAGLIFLOZIN 10 MG PO TABS
10.0000 mg | ORAL_TABLET | Freq: Every day | ORAL | Status: DC
  Administered 2022-02-04: 10 mg via ORAL
  Filled 2022-02-03: qty 1

## 2022-02-03 MED ORDER — WARFARIN SODIUM 5 MG PO TABS
5.0000 mg | ORAL_TABLET | Freq: Every day | ORAL | Status: DC
  Administered 2022-02-03: 5 mg via ORAL
  Filled 2022-02-03: qty 1

## 2022-02-03 MED ORDER — CEFAZOLIN SODIUM-DEXTROSE 2-4 GM/100ML-% IV SOLN
2.0000 g | INTRAVENOUS | Status: AC
  Administered 2022-02-03: 2 g via INTRAVENOUS

## 2022-02-03 MED ORDER — PRAVASTATIN SODIUM 40 MG PO TABS
40.0000 mg | ORAL_TABLET | Freq: Every day | ORAL | Status: DC
  Administered 2022-02-03: 40 mg via ORAL
  Filled 2022-02-03: qty 1

## 2022-02-03 MED ORDER — CHLORHEXIDINE GLUCONATE 4 % EX LIQD
4.0000 | Freq: Once | CUTANEOUS | Status: DC
  Administered 2022-02-03: 4 via TOPICAL

## 2022-02-03 MED ORDER — SODIUM CHLORIDE 0.9 % IV SOLN
80.0000 mg | INTRAVENOUS | Status: AC
  Administered 2022-02-03: 80 mg

## 2022-02-03 MED ORDER — TORSEMIDE 20 MG PO TABS
60.0000 mg | ORAL_TABLET | Freq: Every day | ORAL | Status: DC
  Administered 2022-02-03: 60 mg via ORAL
  Filled 2022-02-03: qty 3

## 2022-02-03 MED ORDER — ADULT MULTIVITAMIN W/MINERALS CH
1.0000 | ORAL_TABLET | Freq: Every morning | ORAL | Status: DC
  Administered 2022-02-04: 1 via ORAL
  Filled 2022-02-03: qty 1

## 2022-02-03 MED ORDER — DIGOXIN 125 MCG PO TABS
0.1250 mg | ORAL_TABLET | Freq: Every day | ORAL | Status: DC
  Administered 2022-02-03 – 2022-02-04 (×2): 0.125 mg via ORAL
  Filled 2022-02-03 (×2): qty 1

## 2022-02-03 MED ORDER — LOSARTAN POTASSIUM 25 MG PO TABS
12.5000 mg | ORAL_TABLET | Freq: Every day | ORAL | Status: DC
  Administered 2022-02-03: 12.5 mg via ORAL
  Filled 2022-02-03: qty 1

## 2022-02-03 MED ORDER — PHENOBARBITAL 97.2 MG PO TABS
97.2000 mg | ORAL_TABLET | Freq: Every evening | ORAL | Status: DC
  Administered 2022-02-03: 97.2 mg via ORAL
  Filled 2022-02-03: qty 1

## 2022-02-03 SURGICAL SUPPLY — 16 items
BALLN COR SINUS VENO 6FR 80 (BALLOONS) ×1
BALLOON COR SINUS VENO 6FR 80 (BALLOONS) IMPLANT
CABLE SURGICAL S-101-97-12 (CABLE) ×1 IMPLANT
CATH CPS DIRECT 135 DS2C020 (CATHETERS) IMPLANT
CPS IMPLANT KIT 410190 (MISCELLANEOUS) IMPLANT
ICD GALLANT HFCRTD CDHFA500Q (ICD Generator) IMPLANT
LEAD DURATA 7122Q-65CM (Lead) IMPLANT
LEAD QUARTET 1458Q-86CM (Lead) IMPLANT
LEAD ULTIPACE 52 LPA1231/52 (Lead) IMPLANT
PAD DEFIB RADIO PHYSIO CONN (PAD) ×1 IMPLANT
SHEATH 7FR PRELUDE SNAP 13 (SHEATH) IMPLANT
SHEATH PROBE COVER 6X72 (BAG) IMPLANT
SLITTER AGILIS HISPRO (INSTRUMENTS) IMPLANT
TRAY PACEMAKER INSERTION (PACKS) ×1 IMPLANT
WIRE ACUITY WHISPER EDS 4648 (WIRE) IMPLANT
WIRE HI TORQ VERSACORE-J 145CM (WIRE) IMPLANT

## 2022-02-03 NOTE — Discharge Instructions (Signed)
After Your ICD (Implantable Cardiac Defibrillator)  ACTIVITY Do not lift your arm above shoulder height for 1 week after your procedure. After 7 days, you may progress as below.  You should remove your sling 24 hours after your procedure, unless otherwise instructed by your provider.     Friday February 10, 2022  Saturday February 11, 2022 Sunday February 12, 2022 Monday February 13, 2022   Do not lift, push, pull, or carry anything over 10 pounds with the affected arm until 6 weeks (Friday March 17, 2022 ) after your procedure.   You may drive AFTER your wound check, unless you have been told otherwise by your provider.   Ask your healthcare provider when you can go back to work   INCISION/Dressing If you are on a blood thinner such as Coumadin, Xarelto, Eliquis, Plavix, or Pradaxa please confirm with your provider when this should be resumed.   If large square, outer bandage is left in place, this can be removed after 24 hours from your procedure. Do not remove steri-strips or glue as below.   Monitor your defibrillator site for redness, swelling, and drainage. Call the device clinic at [phone] if you experience these symptoms or fever/chills.  If your incision is sealed with Steri-strips or staples, you may shower 7 days after your procedure or when told by your provider. Do not remove the steri-strips or let the shower hit directly on your site. You may wash around your site with soap and water.    If you were discharged in a sling, please do not wear this during the day more than 48 hours after your surgery unless otherwise instructed. This may increase the risk of stiffness and soreness in your shoulder.   Avoid lotions, ointments, or perfumes over your incision until it is well-healed.  You may use a hot tub or a pool AFTER your wound check appointment if the incision is completely closed.  Your ICD is designed to protect you from life threatening heart rhythms. Because of  this, you may receive a shock.   1 shock with no symptoms:  Call the office during business hours. 1 shock with symptoms (chest pain, chest pressure, dizziness, lightheadedness, shortness of breath, overall feeling unwell):  Call 911. If you experience 2 or more shocks in 24 hours:  Call 911. If you receive a shock, you should not drive for 6 months per the [place] DMV IF you receive appropriate therapy from your ICD.   ICD Alerts:  Some alerts are vibratory and others beep. These are NOT emergencies. Please call our office to let us know. If this occurs at night or on weekends, it can wait until the next business day. Send a remote transmission.  If your device is capable of reading fluid status (for heart failure), you will be offered monthly monitoring to review this with you.   DEVICE MANAGEMENT Remote monitoring is used to monitor your ICD from home. This monitoring is scheduled every 91 days by our office. It allows [place] to keep an eye on the functioning of your device to ensure it is working properly. You will routinely see your Electrophysiologist annually (more often if necessary).   You should receive your ID card for your new device in 4-8 weeks. Keep this card with you at all times once received. Consider wearing a medical alert bracelet or necklace.  Your ICD  may be MRI compatible. This will be discussed at your next office visit/wound check.  You should avoid  contact with strong electric or magnetic fields.   Do not use amateur (ham) radio equipment or electric (arc) welding torches. MP3 player headphones with magnets should not be used. Some devices are safe to use if held at least 12 inches (30 cm) from your defibrillator. These include power tools, lawn mowers, and speakers. If you are unsure if something is safe to use, ask your health care provider.  When using your cell phone, hold it to the ear that is on the opposite side from the defibrillator. Do not leave your cell phone in a  pocket over the defibrillator.  You may safely use electric blankets, heating pads, computers, and microwave ovens.  Call the office right away if: You have chest pain. You feel more than one shock. You feel more short of breath than you have felt before. You feel more light-headed than you have felt before. Your incision starts to open up.  This information is not intended to replace advice given to you by your health care provider. Make sure you discuss any questions you have with your health care provider.

## 2022-02-03 NOTE — Discharge Summary (Incomplete)
ELECTROPHYSIOLOGY PROCEDURE DISCHARGE SUMMARY    Patient ID: Ruben Murray,  MRN: [id number], DOB/AGE: Nov 25, 1945 76 y.o.  Admit date: 02/03/2022 Discharge date: 02/04/2022  Primary Care Physician: [name], MD  Primary Cardiologist: [name], MD  Electrophysiologist: Dr. Curt Bears    Primary Diagnosis:  Chronic systolic CHF   Secondary Diagnosis: Permanent AF  Allergies  Allergen Reactions   Dilaudid [Hydromorphone Hcl] Other (See Comments)    Makes pt hyper    Hydromorphone Other (See Comments)    hyperactiviity Other reaction(s): made him wild Other reaction(s): Unknown   Morphine Sulfate     Other reaction(s): at high dose causes confusion Other reaction(s): at high dose causes confusion Other reaction(s): at high dose causes confusion, Other (See Comments) Other reaction(s): at high dose causes confusion Other reaction(s): at high dose causes confusion    Tegretol [Carbamazepine] Hives   Carbamazepine Hives, Rash and Other (See Comments)    Other reaction(s): hives Other reaction(s): Hives Other reaction(s): hives     Procedures This Admission:  1.  Implantation of a Abbott BiV ICD on 02/03/2022 by Dr. Curt Bears.  The patient received a Abbot Gallant [id number] (BiV)  with Abbott Ultipace 1231-52 right atrial lead and Abbott Durata 7122 right ventricular lead. Pt received a Abbott Quartet 1458Q LV lead. DFTs were deferred at time of implant There were no post procedure complications 2.  CXR on 12/2 demonstrated no pneumothorax status post device implantation.      Brief HPI: Ruben Murray is a 76 y.o. male was referred to electrophysiology in the outpatient setting  for consideration of ICD implantation.  Past medical history includes above.  The patient has persistent LV dysfunction despite guideline directed therapy.  Risks, benefits, and alternatives to ICD implantation were reviewed with the patient who wished to proceed.   Hospital Course:   The patient was admitted and underwent implantation of a St. Jude BiV ICD with details as outlined above. They were monitored on telemetry overnight which demonstrated appropriate pacing .  Left chest was without hematoma or ecchymosis.  The device was interrogated and found to be functioning normally.  CXR was obtained and demonstrated no pneumothorax status post device implantation..  Wound care, arm mobility, and restrictions were reviewed with the patient.  The patient was examined and considered stable for discharge to home.   The patient's discharge medications include beta blocker (metoprolol) with low dose losartan 12.60m daily and spironolactone 12.56mdaily  Anticoagulation resumption This patient coumadin was resumed post procedure with INR of 2.3 on day of discharge.   Physical Exam: Vitals:   02/03/22 2000 02/04/22 0000 02/04/22 0400 02/04/22 0734  BP: 117/68 106/67 109/81 103/66  Pulse: 75 77 96 83  Resp: 11 17 (!) 22 18  Temp:  98.2 F (36.8 C) 97.8 F (36.6 C) 97.7 F (36.5 C)  TempSrc:  Oral Oral Oral  SpO2: 92% 92% 93% 93%  Weight:      Height:        Exam per MD note   Labs:   Lab Results  Component Value Date   WBC 7.3 01/27/2022   HGB 15.2 01/27/2022   HCT 47.6 01/27/2022   MCV 99.6 01/27/2022   PLT 167 01/27/2022    No results for input(s): "NA", "K", "CL", "CO2", "BUN", "CREATININE", "CALCIUM", "PROT", "BILITOT", "ALKPHOS", "ALT", "AST", "GLUCOSE" in the last 168 hours.  Invalid input(s): "LABALBU"   Discharge Medications:  Allergies as of 02/04/2022  Reactions   Dilaudid [hydromorphone Hcl] Other (See Comments)   Makes pt hyper    Hydromorphone Other (See Comments)   hyperactiviity Other reaction(s): made him wild Other reaction(s): Unknown   Morphine Sulfate    Other reaction(s): at high dose causes confusion Other reaction(s): at high dose causes confusion Other reaction(s): at high dose causes confusion, Other (See  Comments) Other reaction(s): at high dose causes confusion Other reaction(s): at high dose causes confusion   Tegretol [carbamazepine] Hives   Carbamazepine Hives, Rash, Other (See Comments)   Other reaction(s): hives Other reaction(s): Hives Other reaction(s): hives        Medication List     TAKE these medications    acetaminophen 500 MG tablet Commonly known as: TYLENOL Take 1,000 mg by mouth at bedtime.   busPIRone 5 MG tablet Commonly known as: BUSPAR Take 5 mg by mouth 2 (two) times daily.   Carboxymethylcellulose Sod PF 0.5 % Soln Place 1 drop into both eyes daily as needed (dry eyes).   digoxin 0.125 MG tablet Commonly known as: LANOXIN Take 1 tablet (0.125 mg total) by mouth daily.   escitalopram 20 MG tablet Commonly known as: LEXAPRO Take 20 mg by mouth at bedtime.   finasteride 5 MG tablet Commonly known as: PROSCAR Take 1 tablet (5 mg total) by mouth daily at 12 noon.   fluticasone 50 MCG/ACT nasal spray Commonly known as: FLONASE Place 1 spray into both nostrils 2 (two) times daily.   Jardiance 10 MG Tabs tablet Generic drug: empagliflozin TAKE 1 TABLET BY MOUTH DAILY BEFORE BREAKFAST.   loratadine 10 MG tablet Commonly known as: CLARITIN Take 10 mg by mouth every morning.   losartan 25 MG tablet Commonly known as: COZAAR TAKE 1/2 TABLET BY MOUTH EVERY DAY   metoprolol succinate 100 MG 24 hr tablet Commonly known as: TOPROL-XL Take 1 tablet (100 mg total) by mouth 2 (two) times daily. Take with or immediately following a meal.   multivitamin with minerals Tabs tablet Take 1 tablet by mouth every morning.   PHENobarbital 97.2 MG tablet Commonly known as: LUMINAL Take 97.2 mg by mouth every evening. Takes between 3:00-5:00pm.   potassium chloride SA 20 MEQ tablet Commonly known as: KLOR-CON M Take 1 tablet (20 mEq total) by mouth daily.   pravastatin 40 MG tablet Commonly known as: PRAVACHOL Take 40 mg by mouth at bedtime.    spironolactone 25 MG tablet Commonly known as: ALDACTONE Take 0.5 tablets (12.5 mg total) by mouth daily.   Torsemide 60 MG Tabs Take 60 mg by mouth daily.   triamcinolone cream 0.1 % Commonly known as: KENALOG Apply 1 application  topically 2 (two) times daily as needed (rash).   Vitamin D3 25 MCG (1000 UT) Caps Take 1,000 Units by mouth daily.   warfarin 5 MG tablet Commonly known as: COUMADIN Take as directed. If you are unsure how to take this medication, talk to your nurse or doctor. Original instructions: Take 5 mg by mouth at bedtime.        Disposition:  Discharge Instructions     Diet - low sodium heart healthy   Complete by: As directed    Increase activity slowly   Complete by: As directed        [place] Follow up.   Why: on 12/14 at 1040 am for post defibrillator check Contact information: [address]  [place] [id number] [place] [facility] [place]. [facility] Follow up on 02/08/2022.   Specialty: Cardiology Why: at 1:30pm for your INR recheck Contact information: [place] [id number] [place] [id number] [phone]                Duration of Discharge Encounter: Greater than 30 minutes including physician time.  Signed, Reino Bellis, NP  02/04/2022 9:41 AM  EP attending  Patient seen and examined.  Agree with the findings as noted above.  He is dual-chamber ICD is working normally.  Chest x-ray demonstrates normal lead position and no pneumothorax.  His interrogation under my direction demonstrates normal dual-chamber ICD function.  He will be discharged home with usual follow-up.  Cristopher Peru, MD

## 2022-02-03 NOTE — Interval H&P Note (Signed)
History and Physical Interval Note:  02/03/2022 12:00 PM  Ruben Murray  has presented today for surgery, with the diagnosis of cardiomyopathy.  The various methods of treatment have been discussed with the patient and family. After consideration of risks, benefits and other options for treatment, the patient has consented to  Procedure(s): BIV ICD INSERTION CRT-D (N/A) as a surgical intervention.  The patient's history has been reviewed, patient examined, no change in status, stable for surgery.  I have reviewed the patient's chart and labs.  Questions were answered to the patient's satisfaction.     Ruben Murray  ICD Criteria  Current LVEF:33%. Within 12 months prior to implant: Yes   Heart failure history: Yes, Class II  Cardiomyopathy history: Yes, Non-Ischemic Cardiomyopathy.  Atrial Fibrillation/Atrial Flutter: Yes, Permanent.  Ventricular tachycardia history: No.  Cardiac arrest history: No.  History of syndromes with risk of sudden death: No.  Previous ICD: No.  Current ICD indication: Primary  PPM indication: No.  Class I or II Bradycardia indication present: No  Beta Blocker therapy for 3 or more months: Yes, prescribed.   Ace Inhibitor/ARB therapy for 3 or more months: No, medical reason.   I have seen Ruben Murray is a 76 y.o. malepre-procedural and has been referred by [facility] for consideration of ICD implant for primary prevention of sudden death.  The patient's chart has been reviewed and they meet criteria for ICD implant.  I have had a thorough discussion with the patient reviewing options.  The patient and their family (if available) have had opportunities to ask questions and have them answered. The patient and I have decided together through the [place] Support Tool to implant ICD at this time.  Risks, benefits, alternatives to ICD implantation were discussed in detail with the patient today. The patient  understands that  the risks include but are not limited to bleeding, infection, pneumothorax, perforation, tamponade, vascular damage, renal failure, MI, stroke, death, inappropriate shocks, and lead dislodgement and wishes to proceed.

## 2022-02-04 ENCOUNTER — Ambulatory Visit (HOSPITAL_COMMUNITY): Payer: Medicare PPO

## 2022-02-04 ENCOUNTER — Encounter (HOSPITAL_COMMUNITY): Payer: Self-pay | Admitting: Cardiology

## 2022-02-04 DIAGNOSIS — I5022 Chronic systolic (congestive) heart failure: Secondary | ICD-10-CM

## 2022-02-04 DIAGNOSIS — I4821 Permanent atrial fibrillation: Secondary | ICD-10-CM | POA: Diagnosis not present

## 2022-02-04 DIAGNOSIS — I472 Ventricular tachycardia, unspecified: Secondary | ICD-10-CM | POA: Diagnosis not present

## 2022-02-04 DIAGNOSIS — I110 Hypertensive heart disease with heart failure: Secondary | ICD-10-CM | POA: Diagnosis not present

## 2022-02-04 DIAGNOSIS — I428 Other cardiomyopathies: Secondary | ICD-10-CM | POA: Diagnosis not present

## 2022-02-04 DIAGNOSIS — Z7901 Long term (current) use of anticoagulants: Secondary | ICD-10-CM | POA: Diagnosis not present

## 2022-02-04 DIAGNOSIS — Z79899 Other long term (current) drug therapy: Secondary | ICD-10-CM | POA: Diagnosis not present

## 2022-02-04 DIAGNOSIS — I452 Bifascicular block: Secondary | ICD-10-CM | POA: Diagnosis not present

## 2022-02-04 DIAGNOSIS — Z95810 Presence of automatic (implantable) cardiac defibrillator: Secondary | ICD-10-CM | POA: Diagnosis not present

## 2022-02-04 DIAGNOSIS — G4733 Obstructive sleep apnea (adult) (pediatric): Secondary | ICD-10-CM | POA: Diagnosis not present

## 2022-02-04 DIAGNOSIS — Z87891 Personal history of nicotine dependence: Secondary | ICD-10-CM | POA: Diagnosis not present

## 2022-02-04 LAB — PROTIME-INR
INR: 2.3 — ABNORMAL HIGH (ref 0.8–1.2)
Prothrombin Time: 24.9 s — ABNORMAL HIGH (ref 11.4–15.2)

## 2022-02-04 NOTE — Progress Notes (Signed)
Rounding Note    Patient Name: Ruben Murray Date of Encounter: 02/04/2022  [facility] Cardiologist: [name], MD   Subjective   No chest pain or sob, s/p biv ICD  Inpatient Medications    Scheduled Meds:  acetaminophen  1,000 mg Oral QHS   busPIRone  5 mg Oral BID   cholecalciferol  1,000 Units Oral Daily   digoxin  0.125 mg Oral Daily   empagliflozin  10 mg Oral QAC breakfast   escitalopram  20 mg Oral QHS   finasteride  5 mg Oral Q1200   fluticasone  1 spray Each Nare BID   loratadine  10 mg Oral q morning   losartan  12.5 mg Oral Daily   metoprolol succinate  100 mg Oral BID   multivitamin with minerals  1 tablet Oral q morning   PHENobarbital  97.2 mg Oral QPM   potassium chloride SA  20 mEq Oral Daily   pravastatin  40 mg Oral QHS   spironolactone  12.5 mg Oral Daily   torsemide  60 mg Oral Daily   warfarin  5 mg Oral QHS   Warfarin - Physician Dosing Inpatient   Does not apply q1600   Continuous Infusions:  PRN Meds: acetaminophen, mouth rinse, polyvinyl alcohol   Vital Signs    Vitals:   02/03/22 2000 02/04/22 0000 02/04/22 0400 02/04/22 0734  BP: 117/68 106/67 109/81 103/66  Pulse: 75 77 96 83  Resp: 11 17 (!) 22 18  Temp:  98.2 F (36.8 C) 97.8 F (36.6 C) 97.7 F (36.5 C)  TempSrc:  Oral Oral Oral  SpO2: 92% 92% 93% 93%  Weight:      Height:        Intake/Output Summary (Last 24 hours) at 02/04/2022 0931 Last data filed at 02/04/2022 7829 Gross per 24 hour  Intake 820 ml  Output 2100 ml  Net -1280 ml      02/03/2022   12:12 PM 01/31/2022    9:30 AM 01/27/2022    2:14 PM  Last 3 Weights  Weight (lbs) 225 lb 223 lb 220 lb  Weight (kg) 102.059 kg 101.152 kg 99.791 kg      Telemetry    Atrial fib with ventricular pacing - Personally Reviewed  ECG    Atrial fib with biv pacing - Personally Reviewed  Physical Exam   GEN: No acute distress.   Neck: No JVD Cardiac: IRIRR, no murmurs, rubs, or gallops.   Respiratory: Clear to auscultation bilaterally. GI: Soft, nontender, non-distended  MS: No edema; No deformity. Neuro:  Nonfocal  Psych: Normal affect   Labs    High Sensitivity Troponin:   Recent Labs  Lab 01/27/22 1540 01/27/22 1721  TROPONINIHS 5 5     ChemistryNo results for input(s): "NA", "K", "CL", "CO2", "GLUCOSE", "BUN", "CREATININE", "CALCIUM", "MG", "PROT", "ALBUMIN", "AST", "ALT", "ALKPHOS", "BILITOT", "GFRNONAA", "GFRAA", "ANIONGAP" in the last 168 hours.  Lipids No results for input(s): "CHOL", "TRIG", "HDL", "LABVLDL", "LDLCALC", "CHOLHDL" in the last 168 hours.  HematologyNo results for input(s): "WBC", "RBC", "HGB", "HCT", "MCV", "MCH", "MCHC", "RDW", "PLT" in the last 168 hours. Thyroid No results for input(s): "TSH", "FREET4" in the last 168 hours.  BNPNo results for input(s): "BNP", "PROBNP" in the last 168 hours.  DDimer No results for input(s): "DDIMER" in the last 168 hours.   Radiology    DG Chest 2 View  Result Date: 02/04/2022 CLINICAL DATA:  [id number] Cardiac device in situ, other [phone] EXAM:  CHEST - 2 VIEW COMPARISON:  01/27/2022 FINDINGS: The mediastinal contours are within normal limits. No cardiomegaly. Interval insertion of left subclavian vein approach biventricular pacemaker/AICD with leads in expected locations. The lungs are clear bilaterally without evidence of focal consolidation, pleural effusion, or pneumothorax. No acute osseous abnormality. IMPRESSION: Status post left subclavian vein approach biventricular pacemaker/AICD without complicating features. Electronically Signed   By: Ruthann Cancer M.D.   On: 02/04/2022 09:00   EP PPM/ICD IMPLANT  Result Date: 02/03/2022 SURGEON: Allegra Lai, MD PREPROCEDURE DIAGNOSES: 1. Nonischemic cardiomyopathy. 2. New York Heart Association class III, heart failure chronically. 3. Left bundle-branch block. POSTPROCEDURE DIAGNOSES: 1. Nonischemic cardiomyopathy. 2. New York Heart Association class III heart  failure chronically. 3. Left bundle-branch block. PROCEDURES: 1. Left upper extremity venography 2. Biventricular ICD implantation. INTRODUCTION:  Ruben Murray is a 76 y.o. male with a nonischemic CM (EF 30-35%), NYHA Class III CHF, and atrial fibrillation.  His atrial fibrillation has had rapid rates.  He has a right bundle branch block.  The patient has been treated with an optimal medical regimen but continues to have a depressed ejection fraction and NYHA Class III CHF symptoms. he therefore presents today for a biventricular ICD implantation with potential plans for AV nodal ablation. DESCRIPTION OF PROCEDURE: Informed written consent was obtained and the patient was brought to the electrophysiology lab in the fasting state. The patient was adequately sedated with intravenous Versed, and fentanyl as outlined in the nursing report. The patient's left chest was prepped and draped in the usual sterile fashion by the EP lab staff. The skin overlying the left deltopectoral region was infiltrated with lidocaine for local analgesia. A 5-cm incision was made over the left deltopectoral region. A left subcutaneous defibrillator pocket was fashioned using a combination of sharp and blunt dissection. Electrocautery was used to assure hemostasis. RA/RV Lead Placement: The left axillary vein was cannulated with fluoroscopic visualization. No contrast was required for this endeavor. Through the left axillary vein, a Abbott UlitiPace [id number] (serial # [id number] ) right atrial lead and a Abbott Durata [id number] (serial number [id number]) right ventricular defibrillator lead were advanced with fluoroscopic visualization into the right atrial appendage and right ventricular apex positions respectively. Initial atrial lead P-waves measured 0.9  mV with an impedance of 608 ohms in atrial fibrillation. The right ventricular lead R-wave measured 12.1 mV with impedance of 656 ohms and a threshold of 0.5 volts at 0.5 milliseconds. LV  Lead Placement: A Abbott 135 guide was advanced through the left axillary vein into the low lateral right atrium. A Bard curved Damato catheter was introduced through the guide and used to cannulate the coronary sinus. Coronary sinus cannulation was confirmed with electrogram recording from the hexapolar catheter. A selective coronary sinus venogram was performed by hand injection of nonionic contrast. This demonstrated a large CS body with very small/ atretic distal branches. There was a moderate sized lateral coronary sinus branch was noted along the mid portion of the CS body. No other posterior branches were identified. A Whisper CSJ wire was introduced through the guide and advanced into the distal posterolateral branch. A Abbott Quartet [id number] (serial number [id number] ) lead was advanced through the guide into the lateral branch. This was approximately one-thirds from the base to the apex in a very lateral position. In this location, the left ventricular lead R-waves measured 12.1 mV with impedance of 730 ohms and a threshold of 2 volt at 1 milliseconds in the bipolar LV1-LV2 configuration with no  diaphragmatic stimulation observed when pacing at 10 volts output. The guide was therefore removed. All three leads were secured to the pectoralis fascia using #2 silk suture over the suture sleeves. The pocket then irrigated with copious gentamicin solution. The leads were then connected to a [facility] [id number] (BiV)  (serial Number [id number] ) device. The defibrillator was placed into the pocket. The pocket was then closed in 3 layers with 2.0 Vicryl suture for the subcutaneous and 3.0 Vicryl suture subcuticular layers. Steri-Strips and a sterile dressing were then applied. DFT testing was not performed today. The procedure was therefore considered completed. EBL<58m. There were no early apparhent complications. CONCLUSIONS: 1. Nonischemic cardiomyopathy with atrial fibrillation and chronic New York Heart  Association class III heart failure. 2. Successful biventricular ICD implantation. 3. No early apparent complications.    Cardiac Studies   none  Patient Profile     76y.o. male admitted with atrial fib s/p biv ICD insertion  Assessment & Plan    Chronic systolic heart failure - he appears to be euvolemic Atrial fib with a RVR/CVR - his rates are better. He may require AV node ablation as an outpatient if his rates cannot be controlled. Biv ICD - cxr looks good and interrogation done under my direction demonstrates normal BIV ICD function.   For questions or updates, please contact CCumberland HillPlease consult [email or web address] for contact info under     Signed, GCristopher Peru MD  02/04/2022, 9:31 AM

## 2022-02-06 ENCOUNTER — Encounter (HOSPITAL_COMMUNITY): Payer: Self-pay | Admitting: Cardiology

## 2022-02-08 ENCOUNTER — Ambulatory Visit: Payer: Medicare PPO

## 2022-02-08 ENCOUNTER — Other Ambulatory Visit (HOSPITAL_COMMUNITY): Payer: Self-pay

## 2022-02-08 NOTE — Progress Notes (Signed)
Paramedicine Encounter    Patient ID: Ruben Murray, male    DOB: [birth year], 76 y.o.   MRN: [id number]    Arrived for home visit for [name] who reports to be feeling good today. He denied chest pain, shortness of breath, and dizziness. He has some "itching" around his ICD placement site but no other complaints. I obtained vitals and assessment as noted.   WT- 218lbs BP- 110/68 HR- 88 O2- 98% RR- 16  Lungs- clear  Swelling- NONE   I reviewed meds and confirmed same filling pill box for one week. [name] moved his coumadin visit to Friday so we kept Warfarin dose to 52m tonight and tomorrow night.   Refills: Metoprolol Multivitamin Tylenol   We reviewed upcoming appointments and confirmed same. I will plan to see [name] one week. Home visit complete.   HSalena Saner [name]12/08/2021     Patient Care Team: HCaren Macadam MD as PCP - General (Family Medicine) SJerline Pain MD as PCP - Cardiology (Cardiology) CConstance Haw MD as PCP - Electrophysiology (Cardiology) HCaren Macadam MD (Family Medicine) [facility] as Case Manager (Hospice and Palliative Medicine)  Patient Active Problem List   Diagnosis Date Noted   CHF (congestive heart failure) (HE[place] 02/03/2022   Atrial fibrillation (HC[place] 11/02/2021   Long term (current) use of anticoagulants 11/02/2021   AKI (acute kidney injury) (HR[place] 07/27/2021   Acute pulmonary embolism (HR[place] 07/26/2021   Acute on chronic systolic CHF (congestive heart failure) (HS[place] 05/17/2021   Hypercoagulable state (H[place] 05/10/2021   Left leg pain 04/24/2021   OSA on CPAP 04/24/2021   Mitral regurgitation 04/24/2021   Tricuspid regurgitation 04/24/2021   Dilated aortic root (HKremml[place] 04/24/2021   Permanent atrial fibrillation (HJen[place] 04/19/2021   Chronic arthropathy 04/13/2020   Callus 04/13/2020   Onychomycosis 12/12/2019   Left leg cellulitis 191/50/5697  Chronic systolic heart  failure (HMadras 12/09/2019   Dilated cardiomyopathy (HH[place]    Atrial flutter (H[place] 11/03/2019   Atrial flutter with rapid ventricular response (HSl[place] 11/03/2019   Acquired thrombophilia (HA[place] 01/15/2019   Atrial fibrillation with rapid ventricular response (HR[place] Hill 10/29/2017   Chronic combined systolic and diastolic heart failure (HCo[place] 10/29/2017   Prolonged QT interval 10/29/2017   Sensorineural hearing loss (SNHL) of both ears 01/31/2017   Dysphonia 10/03/2016   Laryngopharyngeal reflux (LPR) 10/03/2016   Nasal polyps 10/03/2016   Rhinitis medicamentosa 10/03/2016   Chronic laryngitis 10/03/2016   Hoarseness 08/31/2016   Moderate persistent asthma 08/02/2016   COPD (chronic obstructive pulmonary disease) (HGrass[place] 08/02/2016   Morbid (severe) obesity due to excess calories (HNelson[place] 08/02/2016   Community acquired pneumonia 03/06/2016   Essential hypertension 03/06/2016   Mixed hyperlipidemia 03/06/2016   Right thyroid nodule 03/06/2016   Seizures (HBeltrami 03/06/2016   Fever 03/12/2015   Recurrent erosion of cornea 06/23/2011    Current Outpatient Medications:    acetaminophen (TYLENOL) 500 MG tablet, Take 1,000 mg by mouth at bedtime., Disp: , Rfl:    busPIRone (BUSPAR) 5 MG tablet, Take 5 mg by mouth 2 (two) times daily., Disp: , Rfl:    Carboxymethylcellulose Sod PF 0.5 % SOLN, Place 1 drop into both eyes daily as needed (dry eyes)., Disp: , Rfl:    Cholecalciferol (VITAMIN D3) 25 MCG (1000 UT) CAPS, Take 1,000 Units by mouth daily., Disp: , Rfl:    digoxin (LANOXIN) 0.125 MG tablet, Take 1 tablet (0.125 mg total) by mouth daily., Disp: 30 tablet, Rfl:  11   escitalopram (LEXAPRO) 20 MG tablet, Take 20 mg by mouth at bedtime., Disp: , Rfl:    finasteride (PROSCAR) 5 MG tablet, Take 1 tablet (5 mg total) by mouth daily at 12 noon., Disp: 90 tablet, Rfl: 1   fluticasone (FLONASE) 50 MCG/ACT nasal spray, Place 1 spray into both nostrils 2 (two) times daily., Disp: , Rfl:    JARDIANCE 10 MG TABS  tablet, TAKE 1 TABLET BY MOUTH DAILY BEFORE BREAKFAST., Disp: 90 tablet, Rfl: 3   loratadine (CLARITIN) 10 MG tablet, Take 10 mg by mouth every morning., Disp: , Rfl:    losartan (COZAAR) 25 MG tablet, TAKE 1/2 TABLET BY MOUTH EVERY DAY, Disp: 45 tablet, Rfl: 3   metoprolol succinate (TOPROL-XL) 100 MG 24 hr tablet, Take 1 tablet (100 mg total) by mouth 2 (two) times daily. Take with or immediately following a meal., Disp: 180 tablet, Rfl: 3   Multiple Vitamin (MULTIVITAMIN WITH MINERALS) TABS tablet, Take 1 tablet by mouth every morning., Disp: , Rfl:    PHENobarbital (LUMINAL) 97.2 MG tablet, Take 97.2 mg by mouth every evening. Takes between 3:00-5:00pm., Disp: , Rfl:    potassium chloride SA (KLOR-CON M) 20 MEQ tablet, Take 1 tablet (20 mEq total) by mouth daily., Disp: 90 tablet, Rfl: 3   pravastatin (PRAVACHOL) 40 MG tablet, Take 40 mg by mouth at bedtime. , Disp: , Rfl:    spironolactone (ALDACTONE) 25 MG tablet, Take 0.5 tablets (12.5 mg total) by mouth daily., Disp: 45 tablet, Rfl: 2   torsemide 60 MG TABS, Take 60 mg by mouth daily., Disp: 30 tablet, Rfl: 1   triamcinolone cream (KENALOG) 0.1 %, Apply 1 application  topically 2 (two) times daily as needed (rash)., Disp: , Rfl:    warfarin (COUMADIN) 5 MG tablet, Take 5 mg by mouth at bedtime., Disp: , Rfl:  Allergies  Allergen Reactions   Dilaudid [Hydromorphone Hcl] Other (See Comments)    Makes pt hyper    Hydromorphone Other (See Comments)    hyperactiviity Other reaction(s): made him wild Other reaction(s): Unknown   Morphine Sulfate     Other reaction(s): at high dose causes confusion Other reaction(s): at high dose causes confusion Other reaction(s): at high dose causes confusion, Other (See Comments) Other reaction(s): at high dose causes confusion Other reaction(s): at high dose causes confusion    Tegretol [Carbamazepine] Hives   Carbamazepine Hives, Rash and Other (See Comments)    Other reaction(s): hives Other  reaction(s): Hives Other reaction(s): hives     Social History   Socioeconomic History   Marital status: Married    Spouse name: Not on file   Number of children: Not on file   Years of education: Not on file   Highest education level: Not on file  Occupational History   Not on file  Tobacco Use   Smoking status: Former    Packs/day: 3.00    Years: 48.00    Total pack years: 144.00    Types: Cigarettes    Quit date: 2000    Years since quitting: 23.9   Smokeless tobacco: Never  Vaping Use   Vaping Use: Never used  Substance and Sexual Activity   Alcohol use: Never   Drug use: Never   Sexual activity: Never  Other Topics Concern   Not on file  Social History Narrative   ** Merged History Encounter **       Social Determinants of Health   Financial Resource Strain: Medium Risk (  10/26/2021)   Overall Financial Resource Strain (CARDIA)    Difficulty of Paying Living Expenses: Somewhat hard  Food Insecurity: No Food Insecurity (02/04/2022)   Hunger Vital Sign    Worried About Running Out of Food in the Last Year: Never true    Ran Out of Food in the Last Year: Never true  Transportation Needs: Unmet Transportation Needs (02/04/2022)   PRAPARE - Hydrologist (Medical): Yes    Lack of Transportation (Non-Medical): No  Physical Activity: Inactive (12/15/2019)   Exercise Vital Sign    Days of Exercise per Week: 0 days    Minutes of Exercise per Session: 0 min  Stress: Stress Concern Present (09/26/2018)   [place]    Feeling of Stress : To some extent  Social Connections: Not on file  Intimate Partner Violence: Not At Risk (02/04/2022)   Humiliation, Afraid, Rape, and Kick questionnaire    Fear of Current or Ex-Partner: No    Emotionally Abused: No    Physically Abused: No    Sexually Abused: No    Physical Exam      Future Appointments  Date Time Provider  [place]  02/08/2022  1:30 PM [facility] None  02/10/2022 11:00 AM [facility] TECH [facility] None  02/16/2022 10:40 AM [facility] DEVICE 1 [facility]  05/09/2022  7:05 AM [facility] DEVICE REMOTES [facility]  05/17/2022  2:00 PM [name], MD [facility]  05/19/2022  2:30 PM [name], MD [facility]  08/08/2022  7:05 AM [facility] DEVICE REMOTES [facility]  11/07/2022  7:10 AM [facility] DEVICE REMOTES [facility]  02/06/2023  7:05 AM [facility] DEVICE REMOTES [facility]  05/08/2023  7:05 AM [facility] DEVICE REMOTES [facility]  08/07/2023  7:05 AM [facility] DEVICE REMOTES [facility]  11/06/2023  7:05 AM [facility] DEVICE REMOTES [facility]  02/05/2024  7:05 AM [facility] DEVICE REMOTES [facility]     ACTION: Home visit completed

## 2022-02-10 ENCOUNTER — Ambulatory Visit
Admission: RE | Admit: 2022-02-10 | Discharge: 2022-02-10 | Disposition: A | Discharge status: Home / Self Care | Payer: Medicare PPO | Source: Ambulatory Visit | Attending: Family Medicine | Admitting: Family Medicine

## 2022-02-10 ENCOUNTER — Ambulatory Visit (INDEPENDENT_AMBULATORY_CARE_PROVIDER_SITE_OTHER): Payer: Medicare PPO | Admitting: *Deleted

## 2022-02-10 DIAGNOSIS — Z7901 Long term (current) use of anticoagulants: Secondary | ICD-10-CM

## 2022-02-10 DIAGNOSIS — I4891 Unspecified atrial fibrillation: Secondary | ICD-10-CM | POA: Insufficient documentation

## 2022-02-10 DIAGNOSIS — I2699 Other pulmonary embolism without acute cor pulmonale: Secondary | ICD-10-CM

## 2022-02-10 DIAGNOSIS — I5022 Chronic systolic (congestive) heart failure: Secondary | ICD-10-CM | POA: Diagnosis not present

## 2022-02-10 LAB — PULMONARY FUNCTION TEST
DL/VA % pred: 82 %
DL/VA: 3.26 ml/min/mmHg/L
DLCO cor % pred: 87 %
DLCO cor: 20.01 ml/min/mmHg
DLCO unc % pred: 86 %
DLCO unc: 19.84 ml/min/mmHg
FEF 25-75 Pre: 1.89 L/sec
FEF2575-%Pred-Pre: 102 %
FEV1-%Pred-Pre: 105 %
FEV1-Pre: 2.77 L
FEV1FVC-%Pred-Pre: 99 %
FEV6-%Pred-Pre: 110 %
FEV6-Pre: 3.82 L
FEV6FVC-%Pred-Pre: 105 %
FVC-%Pred-Pre: 105 %
FVC-Pre: 3.89 L
Pre FEV1/FVC ratio: 71 %
Pre FEV6/FVC Ratio: 98 %

## 2022-02-10 LAB — POCT INR: INR: 3.3 — AB (ref 2.0–3.0)

## 2022-02-10 MED ORDER — ALBUTEROL SULFATE (2.5 MG/3ML) 0.083% IN NEBU: 2.5000 mg | INHALATION_SOLUTION | Freq: Once | RESPIRATORY_TRACT | Status: DC

## 2022-02-10 NOTE — Patient Instructions (Addendum)
Description   HOLD TONIGHT'S DOSE OF WARFARIN then continue taking 1 tablet (78m) daily. Stay consistent with greens (1-2 times per week).  Recheck INR in 2 weeks.   Coumadin Clinic [phone]

## 2022-02-15 ENCOUNTER — Other Ambulatory Visit (HOSPITAL_COMMUNITY): Payer: Self-pay

## 2022-02-15 NOTE — Progress Notes (Signed)
Paramedicine Encounter    Patient ID: Ruben Murray, male    DOB: [birth year], 76 y.o.   MRN: [id number]  Arrived for home visit for Ruben Murray who reports to be feeling okay today. He was seated in his bed alert and oriented. He denied any shortness of breath, chest pain, dizziness or swelling over the last week. He denied any issues with his ICD, he goes for wound check this week.  Vitals as noted: WT- 226.4lbs BP- 110/62 HR- 84 O2- 96% RR- 16 Lungs-clear Swelling- none     He has been compliant with meds over the last week. I reviewed same and filled pill box for one week.   [facility] as noted [facility] Pravastatin Digoxin Loratadine Multi-vitamin    We reviewed upcoming appointments and conformed same. He will see his PCP on 12/22 at 3:45.  Home visit complete. I will see Ruben Murray in one week.   [name], [place] 02/15/2022     Patient Care Team: [name], MD as PCP - General (Family Medicine) [name], MD as PCP - Cardiology (Cardiology) [name], MD as PCP - Electrophysiology (Cardiology) [name], MD (Family Medicine) [place] as Case Manager (Hospice and Palliative Medicine)  Patient Active Problem List   Diagnosis Date Noted   CHF (congestive heart failure) ([place]) 02/03/2022   Atrial fibrillation ([place]) 11/02/2021   Long term (current) use of anticoagulants 11/02/2021   AKI (acute kidney injury) ([place]) 07/27/2021   Acute pulmonary embolism ([place]) 07/26/2021   Acute on chronic systolic CHF (congestive heart failure) ([place]) 05/17/2021   Hypercoagulable state ([place]) 05/10/2021   Left leg pain 04/24/2021   OSA on CPAP 04/24/2021   Mitral regurgitation 04/24/2021   Tricuspid regurgitation 04/24/2021   Dilated aortic root ([place]) 04/24/2021   Permanent atrial fibrillation ([place]) 04/19/2021   Chronic arthropathy 04/13/2020   Callus 04/13/2020   Onychomycosis 12/12/2019    Left leg cellulitis 60/12/9321   Chronic systolic heart failure ([place]) 12/09/2019   Dilated cardiomyopathy ([place])    Atrial flutter ([place]) 11/03/2019   Atrial flutter with rapid ventricular response ([place]) 11/03/2019   Acquired thrombophilia ([place]) 01/15/2019   Atrial fibrillation with rapid ventricular response ([place]) 10/29/2017   Chronic combined systolic and diastolic heart failure ([place]) 10/29/2017   Prolonged QT interval 10/29/2017   Sensorineural hearing loss (SNHL) of both ears 01/31/2017   Dysphonia 10/03/2016   Laryngopharyngeal reflux (LPR) 10/03/2016   Nasal polyps 10/03/2016   Rhinitis medicamentosa 10/03/2016   Chronic laryngitis 10/03/2016   Hoarseness 08/31/2016   Moderate persistent asthma 08/02/2016   COPD (chronic obstructive pulmonary disease) ([place]) 08/02/2016   Morbid (severe) obesity due to excess calories ([place]) 08/02/2016   Community acquired pneumonia 03/06/2016   Essential hypertension 03/06/2016   Mixed hyperlipidemia 03/06/2016   Right thyroid nodule 03/06/2016   Seizures ([place]) 03/06/2016   Fever 03/12/2015   Recurrent erosion of cornea 06/23/2011    Current Outpatient Medications:    acetaminophen (TYLENOL) 500 MG tablet, Take 1,000 mg by mouth at bedtime., Disp: , Rfl:    busPIRone (BUSPAR) 5 MG tablet, Take 5 mg by mouth 2 (two) times daily., Disp: , Rfl:    Carboxymethylcellulose Sod PF 0.5 % SOLN, Place 1 drop into both eyes daily as needed (dry eyes)., Disp: , Rfl:    Cholecalciferol (VITAMIN D3) 25 MCG (1000 UT) CAPS, Take 1,000 Units by mouth daily., Disp: , Rfl:    digoxin (LANOXIN) 0.125 MG  tablet, Take 1 tablet (0.125 mg total) by mouth daily., Disp: 30 tablet, Rfl: 11   escitalopram (LEXAPRO) 20 MG tablet, Take 20 mg by mouth at bedtime., Disp: , Rfl:    finasteride (PROSCAR) 5 MG tablet, Take 1 tablet (5 mg total) by mouth daily at 12 noon., Disp: 90 tablet, Rfl: 1   fluticasone (FLONASE) 50 MCG/ACT nasal spray, Place 1 spray into both nostrils 2  (two) times daily., Disp: , Rfl:    JARDIANCE 10 MG TABS tablet, TAKE 1 TABLET BY MOUTH DAILY BEFORE BREAKFAST., Disp: 90 tablet, Rfl: 3   loratadine (CLARITIN) 10 MG tablet, Take 10 mg by mouth every morning., Disp: , Rfl:    losartan (COZAAR) 25 MG tablet, TAKE 1/2 TABLET BY MOUTH EVERY DAY, Disp: 45 tablet, Rfl: 3   metoprolol succinate (TOPROL-XL) 100 MG 24 hr tablet, Take 1 tablet (100 mg total) by mouth 2 (two) times daily. Take with or immediately following a meal., Disp: 180 tablet, Rfl: 3   Multiple Vitamin (MULTIVITAMIN WITH MINERALS) TABS tablet, Take 1 tablet by mouth every morning., Disp: , Rfl:    PHENobarbital (LUMINAL) 97.2 MG tablet, Take 97.2 mg by mouth every evening. Takes between 3:00-5:00pm., Disp: , Rfl:    potassium chloride SA (KLOR-CON M) 20 MEQ tablet, Take 1 tablet (20 mEq total) by mouth daily., Disp: 90 tablet, Rfl: 3   pravastatin (PRAVACHOL) 40 MG tablet, Take 40 mg by mouth at bedtime. , Disp: , Rfl:    spironolactone (ALDACTONE) 25 MG tablet, Take 0.5 tablets (12.5 mg total) by mouth daily., Disp: 45 tablet, Rfl: 2   torsemide 60 MG TABS, Take 60 mg by mouth daily., Disp: 30 tablet, Rfl: 1   triamcinolone cream (KENALOG) 0.1 %, Apply 1 application  topically 2 (two) times daily as needed (rash)., Disp: , Rfl:    warfarin (COUMADIN) 5 MG tablet, Take 5 mg by mouth at bedtime., Disp: , Rfl:  Allergies  Allergen Reactions   Dilaudid [Hydromorphone Hcl] Other (See Comments)    Makes pt hyper    Hydromorphone Other (See Comments)    hyperactiviity Other reaction(s): made him wild Other reaction(s): Unknown   Morphine Sulfate     Other reaction(s): at high dose causes confusion Other reaction(s): at high dose causes confusion Other reaction(s): at high dose causes confusion, Other (See Comments) Other reaction(s): at high dose causes confusion Other reaction(s): at high dose causes confusion    Tegretol [Carbamazepine] Hives   Carbamazepine Hives, Rash and  Other (See Comments)    Other reaction(s): hives Other reaction(s): Hives Other reaction(s): hives     Social History   Socioeconomic History   Marital status: Married    Spouse name: Not on file   Number of children: Not on file   Years of education: Not on file   Highest education level: Not on file  Occupational History   Not on file  Tobacco Use   Smoking status: Former    Packs/day: 3.00    Years: 48.00    Total pack years: 144.00    Types: Cigarettes    Quit date: 2000    Years since quitting: 23.9   Smokeless tobacco: Never  Vaping Use   Vaping Use: Never used  Substance and Sexual Activity   Alcohol use: Never   Drug use: Never   Sexual activity: Never  Other Topics Concern   Not on file  Social History Narrative   ** Merged History Encounter **  Social Determinants of Health   Financial Resource Strain: Medium Risk (10/26/2021)   Overall Financial Resource Strain (CARDIA)    Difficulty of Paying Living Expenses: Somewhat hard  Food Insecurity: No Food Insecurity (02/04/2022)   Hunger Vital Sign    Worried About Running Out of Food in the Last Year: Never true    Ran Out of Food in the Last Year: Never true  Transportation Needs: Unmet Transportation Needs (02/04/2022)   PRAPARE - Hydrologist (Medical): Yes    Lack of Transportation (Non-Medical): No  Physical Activity: Inactive (12/15/2019)   Exercise Vital Sign    Days of Exercise per Week: 0 days    Minutes of Exercise per Session: 0 min  Stress: Stress Concern Present (09/26/2018)   [place]    Feeling of Stress : To some extent  Social Connections: Not on file  Intimate Partner Violence: Not At Risk (02/04/2022)   Humiliation, Afraid, Rape, and Kick questionnaire    Fear of Current or Ex-Partner: No    Emotionally Abused: No    Physically Abused: No    Sexually Abused: No    Physical  Exam      Future Appointments  Date Time Provider [place]  02/16/2022 10:40 AM [facility]  02/24/2022  1:30 PM [facility] None  05/09/2022  7:05 AM [facility] REMOTES [facility]  05/17/2022  2:00 PM [name], MD [facility]  05/19/2022  2:30 PM [name], MD [facility]  08/08/2022  7:05 AM [facility] REMOTES [facility]  11/07/2022  7:10 AM [facility]OTES [facility]  02/06/2023  7:05 AM [facility]ES [facility]  05/08/2023  7:05 AM [facility]ES [facility]  08/07/2023  7:05 AM [facility]ES [facility]  11/06/2023  7:05 AM [facility]ES [facility]  02/05/2024  7:05 AM [facility]ES [facility]     ACTION: Home visit completed

## 2022-02-16 ENCOUNTER — Telehealth (HOSPITAL_COMMUNITY): Payer: Self-pay

## 2022-02-16 ENCOUNTER — Ambulatory Visit: Payer: Medicare PPO | Attending: Internal Medicine

## 2022-02-16 DIAGNOSIS — I5022 Chronic systolic (congestive) heart failure: Secondary | ICD-10-CM

## 2022-02-16 DIAGNOSIS — I4891 Unspecified atrial fibrillation: Secondary | ICD-10-CM

## 2022-02-16 LAB — CUP PACEART INCLINIC DEVICE CHECK
Date Time Interrogation Session: 20231214130006
Implantable Lead Connection Status: 753985
Implantable Lead Connection Status: 753985
Implantable Lead Connection Status: 753985
Implantable Lead Implant Date: 20231201
Implantable Lead Implant Date: 20231201
Implantable Lead Implant Date: 20231201
Implantable Lead Location: 753858
Implantable Lead Location: 753859
Implantable Lead Location: 753860
Implantable Pulse Generator Implant Date: 20231201
Lead Channel Impedance Value: 440 Ohm
Lead Channel Impedance Value: 480 Ohm
Lead Channel Impedance Value: 510 Ohm
Lead Channel Pacing Threshold Amplitude: 0.5 V
Lead Channel Pacing Threshold Amplitude: 2 V
Lead Channel Pacing Threshold Pulse Width: 0.5 ms
Lead Channel Pacing Threshold Pulse Width: 1 ms
Lead Channel Sensing Intrinsic Amplitude: 11.7 mV
Pulse Gen Serial Number: 211010361

## 2022-02-16 MED ORDER — AMIODARONE HCL 200 MG PO TABS: 200.0000 mg | ORAL_TABLET | Freq: Two times a day (BID) | ORAL | 11 refills | Status: DC

## 2022-02-16 NOTE — Progress Notes (Signed)
Wound check appointment. Steri-strips removed. Wound without redness or edema. Incision edges approximated, wound well healed. Normal device function. Thresholds, sensing, and impedances consistent with implant measurements. Device programmed at 3.5V for extra safety margin until 3 month visit. Per review of events Pt was noted to have multiple high ventricular rate episodes that are probable afib with RVR.  Reviewed strips with Dr. [name].  Will have Pt stop digoxin and start amiodarone 200 mg PO BID.  Follow up scheduled in January with Dr. Curt Bears.  Pt not remotely connecting to phone app.  Pt instructed to call vendor upon arrival home.  Will continue to closely monitor. Patient educated about wound care, arm mobility, lifting restrictions, shock plan. ROV in 3 months with implanting physician.  SJ programmer froze during interrogation.  Unable to save episodes.  Will follow up with SJ to see if strips recoverable.

## 2022-02-16 NOTE — Patient Instructions (Addendum)
After Your ICD (Implantable Cardiac Defibrillator)   STOP DIGOXIN START amiodarone 200 mg one tablet by mouth twice a day   Monitor your defibrillator site for redness, swelling, and drainage. Call the device clinic at [phone] if you experience these symptoms or fever/chills.  Your incision was closed with Steri-strips or staples:  You may shower 7 days after your procedure and wash your incision with soap and water. Avoid lotions, ointments, or perfumes over your incision until it is well-healed.  You may use a hot tub or a pool after your wound check appointment if the incision is completely closed.  Do not lift, push or pull greater than 10 pounds with the affected arm until 6 weeks after your procedure. There are no other restrictions in arm movement after your wound check appointment.  Your ICD is designed to protect you from life threatening heart rhythms. Because of this, you may receive a shock.   1 shock with no symptoms:  Call the office during business hours. 1 shock with symptoms (chest pain, chest pressure, dizziness, lightheadedness, shortness of breath, overall feeling unwell):  Call 911. If you experience 2 or more shocks in 24 hours:  Call 911. If you receive a shock, you should not drive.  [place] DMV - no driving for 6 months if you receive appropriate therapy from your ICD.   ICD Alerts:  Some alerts are vibratory and others beep. These are NOT emergencies. Please call our office to let us know. If this occurs at night or on weekends, it can wait until the next business day. Send a remote transmission.  If your device is capable of reading fluid status (for heart failure), you will be offered monthly monitoring to review this with you.   Remote monitoring is used to monitor your ICD from home. This monitoring is scheduled every 91 days by our office. It allows [place] to keep an eye on the functioning of your device to ensure it is working properly. You will routinely see  your Electrophysiologist annually (more often if necessary).

## 2022-02-16 NOTE — Telephone Encounter (Signed)
Spoke to Ruben Murray who reports he is aware of the med changes from Dr. Ileana Ladd today who stopped his digoxin and added Amiodarone 27m BID. He reports he took out the digoxin in his pill box and added in the amiodarone. I will follow up in the home next week. Call complete.   HSalena Saner EP[place]12/14/2023

## 2022-02-22 ENCOUNTER — Other Ambulatory Visit (HOSPITAL_COMMUNITY): Payer: Self-pay

## 2022-02-22 NOTE — Progress Notes (Signed)
Paramedicine Encounter    Patient ID: Ruben Murray, male    DOB: Apr 01, 1945, 76 y.o.   MRN: [id number]   Arrived for home visit for Ruben Murray who reports to be feeling okay overall just fatigued and short of breath on exertion when helping his wife in the home who is bed bound. He missed several doses of his medications over the last week- he reports he just forgot due to taking care of his wife. He also missed weighing daily over the last 4 days. I obtained vitals as noted:  WT- 223.4lbs BP- 110/60 HR- 84 O2- 94% RR- 18 Lungs- clear Edema- none   I reviewed meds and filled pill box for one week. No refills needed.   We discussed the importance of med compliance and taking them daily at each time specified. He verbalized understanding.   We reviewed upcoming appointments and confirmed same.   We discussed heart failure management including med compliance, diet, social concerns including the decline in health of his wife and hospice involvement increasing in their home hoping to take the work load off of him.   Home visit complete.    Patient Care Team: [name], MD as PCP - General (Family Medicine) [name], MD as PCP - Cardiology (Cardiology) [name], MD as PCP - Electrophysiology (Cardiology) [name], MD (Family Medicine) [place] as Case Manager (Hospice and Palliative Medicine)  Patient Active Problem List   Diagnosis Date Noted   CHF (congestive heart failure) ([place]) 02/03/2022   Atrial fibrillation ([place]) 11/02/2021   Long term (current) use of anticoagulants 11/02/2021   AKI (acute kidney injury) ([place]) 07/27/2021   Acute pulmonary embolism ([place]) 07/26/2021   Acute on chronic systolic CHF (congestive heart failure) ([place]) 05/17/2021   Hypercoagulable state ([place]) 05/10/2021   Left leg pain 04/24/2021   OSA on CPAP 04/24/2021   Mitral regurgitation 04/24/2021   Tricuspid regurgitation 04/24/2021   Dilated aortic root  ([place]) 04/24/2021   Permanent atrial fibrillation ([place]) 04/19/2021   Chronic arthropathy 04/13/2020   Callus 04/13/2020   Onychomycosis 12/12/2019   Left leg cellulitis 02/58/5277   Chronic systolic heart failure ([place]) 12/09/2019   Dilated cardiomyopathy ([place])    Atrial flutter ([place]) 11/03/2019   Atrial flutter with rapid ventricular response ([place]) 11/03/2019   Acquired thrombophilia ([place]) 01/15/2019   Atrial fibrillation with rapid ventricular response ([place]) 10/29/2017   Chronic combined systolic and diastolic heart failure ([place]) 10/29/2017   Prolonged QT interval 10/29/2017   Sensorineural hearing loss (SNHL) of both ears 01/31/2017   Dysphonia 10/03/2016   Laryngopharyngeal reflux (LPR) 10/03/2016   Nasal polyps 10/03/2016   Rhinitis medicamentosa 10/03/2016   Chronic laryngitis 10/03/2016   Hoarseness 08/31/2016   Moderate persistent asthma 08/02/2016   COPD (chronic obstructive pulmonary disease) ([place]) 08/02/2016   Morbid (severe) obesity due to excess calories ([place]) 08/02/2016   Community acquired pneumonia 03/06/2016   Essential hypertension 03/06/2016   Mixed hyperlipidemia 03/06/2016   Right thyroid nodule 03/06/2016   Seizures ([place]) 03/06/2016   Fever 03/12/2015   Recurrent erosion of cornea 06/23/2011    Current Outpatient Medications:    acetaminophen (TYLENOL) 500 MG tablet, Take 1,000 mg by mouth at bedtime., Disp: , Rfl:    amiodarone (PACERONE) 200 MG tablet, Take 1 tablet (200 mg total) by mouth 2 (two) times daily., Disp: 60 tablet, Rfl: 11   busPIRone (BUSPAR) 5 MG tablet, Take 5 mg by mouth 2 (two) times daily., Disp: ,  Rfl:    Carboxymethylcellulose Sod PF 0.5 % SOLN, Place 1 drop into both eyes daily as needed (dry eyes)., Disp: , Rfl:    Cholecalciferol (VITAMIN D3) 25 MCG (1000 UT) CAPS, Take 1,000 Units by mouth daily., Disp: , Rfl:    escitalopram (LEXAPRO) 20 MG tablet, Take 20 mg by mouth at bedtime., Disp: , Rfl:    finasteride (PROSCAR) 5 MG  tablet, Take 1 tablet (5 mg total) by mouth daily at 12 noon., Disp: 90 tablet, Rfl: 1   fluticasone (FLONASE) 50 MCG/ACT nasal spray, Place 1 spray into both nostrils 2 (two) times daily., Disp: , Rfl:    JARDIANCE 10 MG TABS tablet, TAKE 1 TABLET BY MOUTH DAILY BEFORE BREAKFAST., Disp: 90 tablet, Rfl: 3   loratadine (CLARITIN) 10 MG tablet, Take 10 mg by mouth every morning., Disp: , Rfl:    losartan (COZAAR) 25 MG tablet, TAKE 1/2 TABLET BY MOUTH EVERY DAY, Disp: 45 tablet, Rfl: 3   metoprolol succinate (TOPROL-XL) 100 MG 24 hr tablet, Take 1 tablet (100 mg total) by mouth 2 (two) times daily. Take with or immediately following a meal., Disp: 180 tablet, Rfl: 3   Multiple Vitamin (MULTIVITAMIN WITH MINERALS) TABS tablet, Take 1 tablet by mouth every morning., Disp: , Rfl:    PHENobarbital (LUMINAL) 97.2 MG tablet, Take 97.2 mg by mouth every evening. Takes between 3:00-5:00pm., Disp: , Rfl:    potassium chloride SA (KLOR-CON M) 20 MEQ tablet, Take 1 tablet (20 mEq total) by mouth daily., Disp: 90 tablet, Rfl: 3   pravastatin (PRAVACHOL) 40 MG tablet, Take 40 mg by mouth at bedtime. , Disp: , Rfl:    spironolactone (ALDACTONE) 25 MG tablet, Take 0.5 tablets (12.5 mg total) by mouth daily., Disp: 45 tablet, Rfl: 2   torsemide 60 MG TABS, Take 60 mg by mouth daily., Disp: 30 tablet, Rfl: 1   triamcinolone cream (KENALOG) 0.1 %, Apply 1 application  topically 2 (two) times daily as needed (rash)., Disp: , Rfl:    warfarin (COUMADIN) 5 MG tablet, Take 5 mg by mouth at bedtime., Disp: , Rfl:  Allergies  Allergen Reactions   Dilaudid [Hydromorphone Hcl] Other (See Comments)    Makes pt hyper    Hydromorphone Other (See Comments)    hyperactiviity Other reaction(s): made him wild Other reaction(s): Unknown   Morphine Sulfate     Other reaction(s): at high dose causes confusion Other reaction(s): at high dose causes confusion Other reaction(s): at high dose causes confusion, Other (See  Comments) Other reaction(s): at high dose causes confusion Other reaction(s): at high dose causes confusion    Tegretol [Carbamazepine] Hives   Carbamazepine Hives, Rash and Other (See Comments)    Other reaction(s): hives Other reaction(s): Hives Other reaction(s): hives     Social History   Socioeconomic History   Marital status: Married    Spouse name: Not on file   Number of children: Not on file   Years of education: Not on file   Highest education level: Not on file  Occupational History   Not on file  Tobacco Use   Smoking status: Former    Packs/day: 3.00    Years: 48.00    Total pack years: 144.00    Types: Cigarettes    Quit date: 2000    Years since quitting: 23.9   Smokeless tobacco: Never  Vaping Use   Vaping Use: Never used  Substance and Sexual Activity   Alcohol use: Never   Drug  use: Never   Sexual activity: Never  Other Topics Concern   Not on file  Social History Narrative   ** Merged History Encounter **       Social Determinants of Health   Financial Resource Strain: Medium Risk (10/26/2021)   Overall Financial Resource Strain (CARDIA)    Difficulty of Paying Living Expenses: Somewhat hard  Food Insecurity: No Food Insecurity (02/04/2022)   Hunger Vital Sign    Worried About Running Out of Food in the Last Year: Never true    Ran Out of Food in the Last Year: Never true  Transportation Needs: Unmet Transportation Needs (02/04/2022)   PRAPARE - Hydrologist (Medical): Yes    Lack of Transportation (Non-Medical): No  Physical Activity: Inactive (12/15/2019)   Exercise Vital Sign    Days of Exercise per Week: 0 days    Minutes of Exercise per Session: 0 min  Stress: Stress Concern Present (09/26/2018)   [place]    Feeling of Stress : To some extent  Social Connections: Not on file  Intimate Partner Violence: Not At Risk (02/04/2022)    Humiliation, Afraid, Rape, and Kick questionnaire    Fear of Current or Ex-Partner: No    Emotionally Abused: No    Physically Abused: No    Sexually Abused: No    Physical Exam      Future Appointments  Date Time Provider [place]  02/24/2022  1:30 PM [facility] None  03/09/2022 11:45 AM [name], MD [facility]  05/09/2022  7:05 AM [facility] REMOTES [facility]  05/17/2022  2:00 PM [name], MD [facility]  05/19/2022  2:30 PM [name], MD [facility]  08/08/2022  7:05 AM [facility]  11/07/2022  7:10 AM [facility]  02/06/2023  7:05 AM [facility]  05/08/2023  7:05 AM [facility]  08/07/2023  7:05 AM [facility]ES [facility]  11/06/2023  7:05 AM [facility]  02/05/2024  7:05 AM [facility]     ACTION: Home visit completed

## 2022-02-24 ENCOUNTER — Ambulatory Visit: Payer: Medicare PPO | Attending: Cardiology | Admitting: *Deleted

## 2022-02-24 DIAGNOSIS — I2699 Other pulmonary embolism without acute cor pulmonale: Secondary | ICD-10-CM | POA: Diagnosis not present

## 2022-02-24 DIAGNOSIS — E1122 Type 2 diabetes mellitus with diabetic chronic kidney disease: Secondary | ICD-10-CM | POA: Diagnosis not present

## 2022-02-24 DIAGNOSIS — E7800 Pure hypercholesterolemia, unspecified: Secondary | ICD-10-CM | POA: Diagnosis not present

## 2022-02-24 DIAGNOSIS — F324 Major depressive disorder, single episode, in partial remission: Secondary | ICD-10-CM | POA: Diagnosis not present

## 2022-02-24 DIAGNOSIS — G40909 Epilepsy, unspecified, not intractable, without status epilepticus: Secondary | ICD-10-CM | POA: Diagnosis not present

## 2022-02-24 DIAGNOSIS — Z7901 Long term (current) use of anticoagulants: Secondary | ICD-10-CM | POA: Diagnosis not present

## 2022-02-24 DIAGNOSIS — I10 Essential (primary) hypertension: Secondary | ICD-10-CM | POA: Diagnosis not present

## 2022-02-24 DIAGNOSIS — I4891 Unspecified atrial fibrillation: Secondary | ICD-10-CM

## 2022-02-24 DIAGNOSIS — Z23 Encounter for immunization: Secondary | ICD-10-CM | POA: Diagnosis not present

## 2022-02-24 LAB — POCT INR: INR: 2.5 (ref 2.0–3.0)

## 2022-02-24 NOTE — Patient Instructions (Signed)
Description   Continue taking warfarin 1 tablet (50m) daily. Stay consistent with greens (1-2 times per week).  Recheck INR in 3 weeks.   [facility] [phone]

## 2022-03-01 ENCOUNTER — Other Ambulatory Visit (HOSPITAL_COMMUNITY): Payer: Self-pay

## 2022-03-01 NOTE — Progress Notes (Signed)
Paramedicine Encounter    Patient ID: Ruben Murray, male    DOB: 09-14-1945, 76 y.o.   MRN: [id number]   Arrived for home visit for Mr. Beswick who reports to be feeling okay just tired. He denied any chest pain or increased shortness of breath. He admits his weight is up 6lbs from two days ago as he reports eating heavily over the last two days and missing one dose of his medications. I obtained vitals and assessment. No lower leg edema noted. Lungs clear.  WT- 228lbs BP- 102/60 HR- 72 O2- 97% RR- 16 Lungs- clear Edema- NONE   He reports he has been drinking more water over the last week and has had an increase in appetite due to the holiday and neighbors, friends and church members bringing them plates by. I reminded him the importance of med and diet compliance but understand over the holidays is difficult. He verbalized understanding and knows to get back on track. We discussed fluid restrictions and sodium intake suggestions as well as med compliance. He agreed. I gave him his morning meds during our visit.   I reviewed meds and confirmed same filling pill box for one week. [facility]   Mr. Bakken and I reviewed upcoming appointments and visits. He wrote down same.   His wife is under hospice care and is declining. Hospice is coming in three times weekly sometimes more, this is adding to Mr. Monrreal stress and I expressed to him if he needs to talk to someone to let me know and we will assist with resources, etc. He was very grateful.   Home visit complete.  [name], [place] 03/01/2022      Patient Care Team: [name], MD as PCP - General (Family Medicine) [name], MD as PCP - Cardiology (Cardiology) [name], MD as PCP - Electrophysiology (Cardiology) [name], MD (Family Medicine) [place] [place] [facility] as Case Manager (Hospice and Palliative Medicine)  Patient Active  Problem List   Diagnosis Date Noted   CHF (congestive heart failure) ([place]) 02/03/2022   Atrial fibrillation ([place]) 11/02/2021   Long term (current) use of anticoagulants 11/02/2021   AKI (acute kidney injury) ([place]) 07/27/2021   Acute pulmonary embolism ([place]) 07/26/2021   Acute on chronic systolic CHF (congestive heart failure) ([place]) 05/17/2021   Hypercoagulable state ([place]) 05/10/2021   Left leg pain 04/24/2021   OSA on CPAP 04/24/2021   Mitral regurgitation 04/24/2021   Tricuspid regurgitation 04/24/2021   Dilated aortic root ([place]) 04/24/2021   Permanent atrial fibrillation ([place]) 04/19/2021   Chronic arthropathy 04/13/2020   Callus 04/13/2020   Onychomycosis 12/12/2019   Left leg cellulitis 76/73/4193   Chronic systolic heart failure ([place]) 12/09/2019   Dilated cardiomyopathy ([place])    Atrial flutter ([place]) 11/03/2019   Atrial flutter with rapid ventricular response ([place]) 11/03/2019   Acquired thrombophilia ([place]) 01/15/2019   Atrial fibrillation with rapid ventricular response ([place]) 10/29/2017   Chronic combined systolic and diastolic heart failure ([place]) 10/29/2017   Prolonged QT interval 10/29/2017   Sensorineural hearing loss (SNHL) of both ears 01/31/2017   Dysphonia 10/03/2016   Laryngopharyngeal reflux (LPR) 10/03/2016   Nasal polyps 10/03/2016   Rhinitis medicamentosa 10/03/2016   Chronic laryngitis 10/03/2016   Hoarseness 08/31/2016   Moderate persistent asthma 08/02/2016   COPD (chronic obstructive pulmonary disease) ([place]) 08/02/2016   Morbid (severe) obesity due to excess calories ([place]) 08/02/2016   Community acquired pneumonia 03/06/2016  Essential hypertension 03/06/2016   Mixed hyperlipidemia 03/06/2016   Right thyroid nodule 03/06/2016   Seizures ([place]) 03/06/2016   Fever 03/12/2015   Recurrent erosion of cornea 06/23/2011    Current Outpatient Medications:    acetaminophen (TYLENOL) 500 MG tablet, Take 1,000 mg by mouth at bedtime., Disp: , Rfl:    amiodarone  (PACERONE) 200 MG tablet, Take 1 tablet (200 mg total) by mouth 2 (two) times daily., Disp: 60 tablet, Rfl: 11   busPIRone (BUSPAR) 5 MG tablet, Take 5 mg by mouth 2 (two) times daily., Disp: , Rfl:    Carboxymethylcellulose Sod PF 0.5 % SOLN, Place 1 drop into both eyes daily as needed (dry eyes)., Disp: , Rfl:    Cholecalciferol (VITAMIN D3) 25 MCG (1000 UT) CAPS, Take 1,000 Units by mouth daily., Disp: , Rfl:    escitalopram (LEXAPRO) 20 MG tablet, Take 20 mg by mouth at bedtime., Disp: , Rfl:    finasteride (PROSCAR) 5 MG tablet, Take 1 tablet (5 mg total) by mouth daily at 12 noon., Disp: 90 tablet, Rfl: 1   fluticasone (FLONASE) 50 MCG/ACT nasal spray, Place 1 spray into both nostrils 2 (two) times daily., Disp: , Rfl:    JARDIANCE 10 MG TABS tablet, TAKE 1 TABLET BY MOUTH DAILY BEFORE BREAKFAST., Disp: 90 tablet, Rfl: 3   loratadine (CLARITIN) 10 MG tablet, Take 10 mg by mouth every morning., Disp: , Rfl:    losartan (COZAAR) 25 MG tablet, TAKE 1/2 TABLET BY MOUTH EVERY DAY, Disp: 45 tablet, Rfl: 3   metoprolol succinate (TOPROL-XL) 100 MG 24 hr tablet, Take 1 tablet (100 mg total) by mouth 2 (two) times daily. Take with or immediately following a meal., Disp: 180 tablet, Rfl: 3   Multiple Vitamin (MULTIVITAMIN WITH MINERALS) TABS tablet, Take 1 tablet by mouth every morning., Disp: , Rfl:    PHENobarbital (LUMINAL) 97.2 MG tablet, Take 97.2 mg by mouth every evening. Takes between 3:00-5:00pm., Disp: , Rfl:    potassium chloride SA (KLOR-CON M) 20 MEQ tablet, Take 1 tablet (20 mEq total) by mouth daily., Disp: 90 tablet, Rfl: 3   pravastatin (PRAVACHOL) 40 MG tablet, Take 40 mg by mouth at bedtime. , Disp: , Rfl:    spironolactone (ALDACTONE) 25 MG tablet, Take 0.5 tablets (12.5 mg total) by mouth daily., Disp: 45 tablet, Rfl: 2   torsemide 60 MG TABS, Take 60 mg by mouth daily., Disp: 30 tablet, Rfl: 1   triamcinolone cream (KENALOG) 0.1 %, Apply 1 application  topically 2 (two) times daily  as needed (rash)., Disp: , Rfl:    warfarin (COUMADIN) 5 MG tablet, Take 5 mg by mouth at bedtime., Disp: , Rfl:  Allergies  Allergen Reactions   Dilaudid [Hydromorphone Hcl] Other (See Comments)    Makes pt hyper    Hydromorphone Other (See Comments)    hyperactiviity Other reaction(s): made him wild Other reaction(s): Unknown   Morphine Sulfate     Other reaction(s): at high dose causes confusion Other reaction(s): at high dose causes confusion Other reaction(s): at high dose causes confusion, Other (See Comments) Other reaction(s): at high dose causes confusion Other reaction(s): at high dose causes confusion    Tegretol [Carbamazepine] Hives   Carbamazepine Hives, Rash and Other (See Comments)    Other reaction(s): hives Other reaction(s): Hives Other reaction(s): hives     Social History   Socioeconomic History   Marital status: Married    Spouse name: Not on file   Number of children:  Not on file   Years of education: Not on file   Highest education level: Not on file  Occupational History   Not on file  Tobacco Use   Smoking status: Former    Packs/day: 3.00    Years: 48.00    Total pack years: 144.00    Types: Cigarettes    Quit date: 2000    Years since quitting: 24.0   Smokeless tobacco: Never  Vaping Use   Vaping Use: Never used  Substance and Sexual Activity   Alcohol use: Never   Drug use: Never   Sexual activity: Never  Other Topics Concern   Not on file  Social History Narrative   ** Merged History Encounter **       Social Determinants of Health   Financial Resource Strain: Medium Risk (10/26/2021)   Overall Financial Resource Strain (CARDIA)    Difficulty of Paying Living Expenses: Somewhat hard  Food Insecurity: No Food Insecurity (02/04/2022)   Hunger Vital Sign    Worried About Running Out of Food in the Last Year: Never true    Ran Out of Food in the Last Year: Never true  Transportation Needs: Unmet Transportation Needs (02/04/2022)    PRAPARE - Hydrologist (Medical): Yes    Lack of Transportation (Non-Medical): No  Physical Activity: Inactive (12/15/2019)   Exercise Vital Sign    Days of Exercise per Week: 0 days    Minutes of Exercise per Session: 0 min  Stress: Stress Concern Present (09/26/2018)   [place]    Feeling of Stress : To some extent  Social Connections: Not on file  Intimate Partner Violence: Not At Risk (02/04/2022)   Humiliation, Afraid, Rape, and Kick questionnaire    Fear of Current or Ex-Partner: No    Emotionally Abused: No    Physically Abused: No    Sexually Abused: No    Physical Exam      Future Appointments  Date Time Provider [place]  03/09/2022 11:45 AM [name], MD [facility]  03/17/2022  1:30 PM [facility] None  05/09/2022  7:05 AM [facility]OT[facility]  05/17/2022  2:00 PM [name], MD [facility]  05/19/2022  2:30 PM [name], MD [facility]  08/08/2022  7:05 AM [facility] DEVICE REMOTES [facility]  11/07/2022  7:10 AM [facility]  02/06/2023  7:05 AM [facility]OT[facility]  05/08/2023  7:05 AM [facility]  08/07/2023  7:05 AM [facility]  11/06/2023  7:05 AM [facility]  02/05/2024  7:05 AM [facility]     ACTION: Home visit completed

## 2022-03-08 ENCOUNTER — Telehealth (HOSPITAL_COMMUNITY): Payer: Self-pay

## 2022-03-08 ENCOUNTER — Other Ambulatory Visit (HOSPITAL_COMMUNITY): Payer: Self-pay

## 2022-03-08 NOTE — Telephone Encounter (Signed)
After home visit today for Ruben Murray having some fluid overload symptoms I attempted to reach HF clinic triage via telephone without success. I was able to speak to [facility], NP directly in regards to 6lb weight gain overnight and shortness of breath. She advised Torsemide and Potassium increase for three days.   Torsemide 29m BID for 3 days  Potassium 40MEQ in the morning for 3 days  Will route call to her for verification of order received verbally in clinic at 11:00am.   Will have Ruben. BFareand his neighbor who assists with care to add in same to pill box for same.    [name]1/05/2022

## 2022-03-08 NOTE — Progress Notes (Signed)
Paramedicine Encounter    Patient ID: Ruben Murray, male    DOB: 09/14/1945, 77 y.o.   MRN: [id number]   Arrived for home visit for [facility] where he was seated on the side of the bed alert and oriented with some shortness of breath. He reports missing two days of medications Monday and Tuesday this week as his wife was just placed in [facility] for end of life. Ruben Murray is emotionally upset and is having a hard time with this and admits not focusing on his own health over the last week.   Today's weight is up 6lbs over night, slight lower leg edema, shortness of breath on exertion.   I obtained vitals: WT- 227lbs BP- 130/72 HR- 85 O2- 95% RR- 16  Lungs- clear Edema- slight lower leg edema bilaterally   I attempted to reach out to HF triage for advice on same and had no answer after several attempts. (See phone note about discussing with Allena Katz, NP) [Torsemide increased to 34m BID for 3 days and Potassium 40MEQ daily for 3 days]  I reviewed HF management and discussed importance of med compliance despite the circumstances and expressed availability of grief counseling with Hospice and if he had any needs to reach out to me. He has support of friends, church members and neighbors who are helping with some of his needs.   We reviewed upcoming appointments and and he requested to cancel his appointment with Dr. CCurt Bearstomorrow due to his wife's expected passing.   I plan to follow up with Mr. BHaverlandnext week in the home. He knows to reach out if needed.   Ruben Murray EN[place]1/05/2022   Patient Care Team: HCaren Macadam MD as PCP - General (Family Medicine) SJerline Pain MD as PCP - Cardiology (Cardiology) CConstance Haw MD as PCP - Electrophysiology (Cardiology) HCaren Macadam MD (Family Medicine) [place] [facility] as Case Manager (Hospice and Palliative Medicine)  Patient Active Problem List   Diagnosis Date  Noted   CHF (congestive heart failure) ([place] 02/03/2022   Atrial fibrillation ([facility] 11/02/2021   Long term (current) use of anticoagulants 11/02/2021   AKI (acute kidney injury) ([place] 07/27/2021   Acute pulmonary embolism ([facility] 07/26/2021   Acute on chronic systolic CHF (congestive heart failure) ([facility] 05/17/2021   Hypercoagulable state ([facility] 05/10/2021   Left leg pain 04/24/2021   OSA on CPAP 04/24/2021   Mitral regurgitation 04/24/2021   Tricuspid regurgitation 04/24/2021   Dilated aortic root (HEvans 04/24/2021   Permanent atrial fibrillation (H[place]al[place] 04/19/2021   Chronic arthropathy 04/13/2020   Callus 04/13/2020   Onychomycosis 12/12/2019   Left leg cellulitis 199/24/2683  Chronic systolic heart failure ([facility] 12/09/2019   Dilated cardiomyopathy (HDem[place]    Atrial flutter ([facility] 11/03/2019   Atrial flutter with rapid ventricular response (HCedar [place] 11/03/2019   Acquired thrombophilia (HSalamatof 01/15/2019   Atrial fibrillation with rapid ventricular response (HV[facility] 10/29/2017   Chronic combined systolic and diastolic heart failure (HDun[place] 10/29/2017   Prolonged QT interval 10/29/2017   Sensorineural hearing loss (SNHL) of both ears 01/31/2017   Dysphonia 10/03/2016   Laryngopharyngeal reflux (LPR) 10/03/2016   Nasal polyps 10/03/2016   Rhinitis medicamentosa 10/03/2016   Chronic laryngitis 10/03/2016   Hoarseness 08/31/2016   Moderate persistent asthma 08/02/2016   COPD (chronic obstructive pulmonary disease) (HSt. Franc[facility] 08/02/2016   Morbid (severe) obesity due to excess calories (HFort Atkinson 08/02/2016   Community acquired pneumonia 03/06/2016   Essential hypertension 03/06/2016  Mixed hyperlipidemia 03/06/2016   Right thyroid nodule 03/06/2016   Seizures ([place]) 03/06/2016   Fever 03/12/2015   Recurrent erosion of cornea 06/23/2011    Current Outpatient Medications:    acetaminophen (TYLENOL) 500 MG tablet, Take 1,000 mg by mouth at bedtime., Disp: , Rfl:    amiodarone (PACERONE) 200 MG tablet, Take  1 tablet (200 mg total) by mouth 2 (two) times daily., Disp: 60 tablet, Rfl: 11   busPIRone (BUSPAR) 5 MG tablet, Take 5 mg by mouth 2 (two) times daily., Disp: , Rfl:    Carboxymethylcellulose Sod PF 0.5 % SOLN, Place 1 drop into both eyes daily as needed (dry eyes)., Disp: , Rfl:    Cholecalciferol (VITAMIN D3) 25 MCG (1000 UT) CAPS, Take 1,000 Units by mouth daily., Disp: , Rfl:    escitalopram (LEXAPRO) 20 MG tablet, Take 20 mg by mouth at bedtime., Disp: , Rfl:    finasteride (PROSCAR) 5 MG tablet, Take 1 tablet (5 mg total) by mouth daily at 12 noon., Disp: 90 tablet, Rfl: 1   fluticasone (FLONASE) 50 MCG/ACT nasal spray, Place 1 spray into both nostrils 2 (two) times daily., Disp: , Rfl:    JARDIANCE 10 MG TABS tablet, TAKE 1 TABLET BY MOUTH DAILY BEFORE BREAKFAST., Disp: 90 tablet, Rfl: 3   loratadine (CLARITIN) 10 MG tablet, Take 10 mg by mouth every morning., Disp: , Rfl:    losartan (COZAAR) 25 MG tablet, TAKE 1/2 TABLET BY MOUTH EVERY DAY, Disp: 45 tablet, Rfl: 3   metoprolol succinate (TOPROL-XL) 100 MG 24 hr tablet, Take 1 tablet (100 mg total) by mouth 2 (two) times daily. Take with or immediately following a meal., Disp: 180 tablet, Rfl: 3   Multiple Vitamin (MULTIVITAMIN WITH MINERALS) TABS tablet, Take 1 tablet by mouth every morning., Disp: , Rfl:    PHENobarbital (LUMINAL) 97.2 MG tablet, Take 97.2 mg by mouth every evening. Takes between 3:00-5:00pm., Disp: , Rfl:    potassium chloride SA (KLOR-CON M) 20 MEQ tablet, Take 1 tablet (20 mEq total) by mouth daily., Disp: 90 tablet, Rfl: 3   pravastatin (PRAVACHOL) 40 MG tablet, Take 40 mg by mouth at bedtime. , Disp: , Rfl:    spironolactone (ALDACTONE) 25 MG tablet, Take 0.5 tablets (12.5 mg total) by mouth daily., Disp: 45 tablet, Rfl: 2   torsemide 60 MG TABS, Take 60 mg by mouth daily., Disp: 30 tablet, Rfl: 1   triamcinolone cream (KENALOG) 0.1 %, Apply 1 application  topically 2 (two) times daily as needed (rash)., Disp: ,  Rfl:    warfarin (COUMADIN) 5 MG tablet, Take 5 mg by mouth at bedtime., Disp: , Rfl:  Allergies  Allergen Reactions   Dilaudid [Hydromorphone Hcl] Other (See Comments)    Makes pt hyper    Hydromorphone Other (See Comments)    hyperactiviity Other reaction(s): made him wild Other reaction(s): Unknown   Morphine Sulfate     Other reaction(s): at high dose causes confusion Other reaction(s): at high dose causes confusion Other reaction(s): at high dose causes confusion, Other (See Comments) Other reaction(s): at high dose causes confusion Other reaction(s): at high dose causes confusion    Tegretol [Carbamazepine] Hives   Carbamazepine Hives, Rash and Other (See Comments)    Other reaction(s): hives Other reaction(s): Hives Other reaction(s): hives     Social History   Socioeconomic History   Marital status: Married    Spouse name: Not on file   Number of children: Not on file  Years of education: Not on file   Highest education level: Not on file  Occupational History   Not on file  Tobacco Use   Smoking status: Former    Packs/day: 3.00    Years: 48.00    Total pack years: 144.00    Types: Cigarettes    Quit date: 2000    Years since quitting: 24.0   Smokeless tobacco: Never  Vaping Use   Vaping Use: Never used  Substance and Sexual Activity   Alcohol use: Never   Drug use: Never   Sexual activity: Never  Other Topics Concern   Not on file  Social History Narrative   ** Merged History Encounter **       Social Determinants of Health   Financial Resource Strain: Medium Risk (10/26/2021)   Overall Financial Resource Strain (CARDIA)    Difficulty of Paying Living Expenses: Somewhat hard  Food Insecurity: No Food Insecurity (02/04/2022)   Hunger Vital Sign    Worried About Running Out of Food in the Last Year: Never true    Ran Out of Food in the Last Year: Never true  Transportation Needs: Unmet Transportation Needs (02/04/2022)   PRAPARE - Armed forces logistics/support/administrative officer (Medical): Yes    Lack of Transportation (Non-Medical): No  Physical Activity: Inactive (12/15/2019)   Exercise Vital Sign    Days of Exercise per Week: 0 days    Minutes of Exercise per Session: 0 min  Stress: Stress Concern Present (09/26/2018)   [place]    Feeling of Stress : To some extent  Social Connections: Not on file  Intimate Partner Violence: Not At Risk (02/04/2022)   Humiliation, Afraid, Rape, and Kick questionnaire    Fear of Current or Ex-Partner: No    Emotionally Abused: No    Physically Abused: No    Sexually Abused: No    Physical Exam      Future Appointments  Date Time Provider [place]  03/09/2022 11:45 AM [name], MD [facility]  03/17/2022  1:30 PM [facility] None  05/09/2022  7:05 AM [facility] DEVICE REMOTES [facility]  05/17/2022  2:00 PM [name], MD [facility]  05/19/2022  2:30 PM [name], MD [facility]  08/08/2022  7:05 AM [facility]  11/07/2022  7:10 AM [facility]  02/06/2023  7:05 AM [facility]  05/08/2023  7:05 AM [facility]  08/07/2023  7:05 AM [facility]  11/06/2023  7:05 AM [facility]  02/05/2024  7:05 AM [facility]     ACTION: Home visit completed

## 2022-03-09 ENCOUNTER — Encounter: Payer: Medicare PPO | Admitting: Cardiology

## 2022-03-14 ENCOUNTER — Telehealth (HOSPITAL_COMMUNITY): Payer: Self-pay

## 2022-03-14 DIAGNOSIS — I5023 Acute on chronic systolic (congestive) heart failure: Secondary | ICD-10-CM | POA: Diagnosis not present

## 2022-03-14 DIAGNOSIS — M549 Dorsalgia, unspecified: Secondary | ICD-10-CM | POA: Diagnosis not present

## 2022-03-14 DIAGNOSIS — M2550 Pain in unspecified joint: Secondary | ICD-10-CM | POA: Diagnosis not present

## 2022-03-14 NOTE — Telephone Encounter (Signed)
Attempted to reach Mr. Ruben Murray and no answer on call and no ability to leave voicemail. I called his friend [name] who said his phone isn't working right now but he should be home tomorrow for home visit. I will follow up with same.   [name], [place] 03/14/2022

## 2022-03-15 ENCOUNTER — Telehealth (HOSPITAL_COMMUNITY): Payer: Self-pay

## 2022-03-15 ENCOUNTER — Other Ambulatory Visit (HOSPITAL_COMMUNITY): Payer: Self-pay

## 2022-03-15 MED ORDER — SPIRONOLACTONE 25 MG PO TABS: 12.5000 mg | ORAL_TABLET | Freq: Every day | ORAL | 2 refills | Status: DC

## 2022-03-15 NOTE — Addendum Note (Signed)
Addended by: Kerry Dory on: 03/15/2022 03:05 PM   Modules accepted: Orders

## 2022-03-15 NOTE — Telephone Encounter (Addendum)
Reill sent   Message to [facility] with para med as Ruben Murray

## 2022-03-15 NOTE — Telephone Encounter (Signed)
[facility] [place]   Forwarded to HF Triage.   [name], [place] 03/15/2022

## 2022-03-15 NOTE — Progress Notes (Signed)
Paramedicine Encounter    Patient ID: Ruben Murray, male    DOB: May 31, 1945, 77 y.o.   MRN: [id number]   Complaints- shortness of breath on exertion.   Assessment- 2 lbs weight gain overnight, no lower leg edema. No abdominal distention, no JVD, lungs clear.   Compliance with meds-missed one noon, one morning dose.   Pill box filled for one week.   Refills needed- Spironolactone, Warfarin, Buspar   Meds changes since last visit- Increased torsemide for 3 days with extra K- completed same.     Social changes- wife passed away on 03/10/22   There were no vitals taken for this visit. Weight yesterday-228lbs Last visit weight-227lbs  Arrived for home visit for [name] who reports to be feeling okay today. His wife passed away last week and he states he is doing the best he can. He admits to missing one morning dose and one noon dose of his meds over the last week as well as eating fast food, food people are brining by because he has been emotionally upset. Today his weight is up 2 lbs over night and the day before 4 lbs over night. He also admits to drinking more water/tea. I reminded him the suggestions of fluid, diet, sodium and med compliance. He verbalized understanding. I obtained vitals as noted. Meds reviewed and confirmed. Pillbox filled for one wee. [facility] -Warfarin -buspar -spironolactone ([facility])  We reviewed upcoming appointments. I asked him if there was any support he needed at this time and he said no. His niece was with him in the home today and was helping around the house. He denied any further needs at this time. Home visit complete.    [name], [place]  ACTION: Home visit completed    Patient Care Team: [name], MD as PCP - General (Family Medicine) [name], MD as PCP - Cardiology (Cardiology) [name], MD as PCP - Electrophysiology (Cardiology) [name], MD  (Family Medicine) [place] as Case Manager (Hospice and Palliative Medicine)  Patient Active Problem List   Diagnosis Date Noted   CHF (congestive heart failure) ([place]) 02/03/2022   Atrial fibrillation ([place]) 11/02/2021   Long term (current) use of anticoagulants 11/02/2021   AKI (acute kidney injury) ([place]) 07/27/2021   Acute pulmonary embolism ([place]) 07/26/2021   Acute on chronic systolic CHF (congestive heart failure) ([place]) 05/17/2021   Hypercoagulable state ([place]) 05/10/2021   Left leg pain 04/24/2021   OSA on CPAP 04/24/2021   Mitral regurgitation 04/24/2021   Tricuspid regurgitation 04/24/2021   Dilated aortic root ([place]) 04/24/2021   Permanent atrial fibrillation ([place]) 04/19/2021   Chronic arthropathy 04/13/2020   Callus 04/13/2020   Onychomycosis 12/12/2019   Left leg cellulitis 27/25/3664   Chronic systolic heart failure ([place]) 12/09/2019   Dilated cardiomyopathy ([place])    Atrial flutter ([place]) 11/03/2019   Atrial flutter with rapid ventricular response ([place]) 11/03/2019   Acquired thrombophilia ([place]) 01/15/2019   Atrial fibrillation with rapid ventricular response ([place]) 10/29/2017   Chronic combined systolic and diastolic heart failure ([place]) 10/29/2017   Prolonged QT interval 10/29/2017   Sensorineural hearing loss (SNHL) of both ears 01/31/2017   Dysphonia 10/03/2016   Laryngopharyngeal reflux (LPR) 10/03/2016   Nasal polyps 10/03/2016   Rhinitis medicamentosa 10/03/2016   Chronic laryngitis 10/03/2016   Hoarseness 08/31/2016   Moderate persistent asthma 08/02/2016   COPD (chronic obstructive pulmonary disease) ([place]) 08/02/2016   Morbid (  severe) obesity due to excess calories ([place]) 08/02/2016   Community acquired pneumonia 03/06/2016   Essential hypertension 03/06/2016   Mixed hyperlipidemia 03/06/2016   Right thyroid nodule 03/06/2016   Seizures ([place]) 03/06/2016   Fever 03/12/2015   Recurrent erosion of cornea 06/23/2011    Current Outpatient  Medications:    acetaminophen (TYLENOL) 500 MG tablet, Take 1,000 mg by mouth at bedtime., Disp: , Rfl:    amiodarone (PACERONE) 200 MG tablet, Take 1 tablet (200 mg total) by mouth 2 (two) times daily., Disp: 60 tablet, Rfl: 11   busPIRone (BUSPAR) 5 MG tablet, Take 5 mg by mouth 2 (two) times daily., Disp: , Rfl:    Carboxymethylcellulose Sod PF 0.5 % SOLN, Place 1 drop into both eyes daily as needed (dry eyes)., Disp: , Rfl:    Cholecalciferol (VITAMIN D3) 25 MCG (1000 UT) CAPS, Take 1,000 Units by mouth daily., Disp: , Rfl:    escitalopram (LEXAPRO) 20 MG tablet, Take 20 mg by mouth at bedtime., Disp: , Rfl:    finasteride (PROSCAR) 5 MG tablet, Take 1 tablet (5 mg total) by mouth daily at 12 noon., Disp: 90 tablet, Rfl: 1   fluticasone (FLONASE) 50 MCG/ACT nasal spray, Place 1 spray into both nostrils 2 (two) times daily., Disp: , Rfl:    JARDIANCE 10 MG TABS tablet, TAKE 1 TABLET BY MOUTH DAILY BEFORE BREAKFAST., Disp: 90 tablet, Rfl: 3   loratadine (CLARITIN) 10 MG tablet, Take 10 mg by mouth every morning., Disp: , Rfl:    losartan (COZAAR) 25 MG tablet, TAKE 1/2 TABLET BY MOUTH EVERY DAY, Disp: 45 tablet, Rfl: 3   metoprolol succinate (TOPROL-XL) 100 MG 24 hr tablet, Take 1 tablet (100 mg total) by mouth 2 (two) times daily. Take with or immediately following a meal., Disp: 180 tablet, Rfl: 3   Multiple Vitamin (MULTIVITAMIN WITH MINERALS) TABS tablet, Take 1 tablet by mouth every morning., Disp: , Rfl:    PHENobarbital (LUMINAL) 97.2 MG tablet, Take 97.2 mg by mouth every evening. Takes between 3:00-5:00pm., Disp: , Rfl:    potassium chloride SA (KLOR-CON M) 20 MEQ tablet, Take 1 tablet (20 mEq total) by mouth daily., Disp: 90 tablet, Rfl: 3   pravastatin (PRAVACHOL) 40 MG tablet, Take 40 mg by mouth at bedtime. , Disp: , Rfl:    spironolactone (ALDACTONE) 25 MG tablet, Take 0.5 tablets (12.5 mg total) by mouth daily., Disp: 45 tablet, Rfl: 2   torsemide 60 MG TABS, Take 60 mg by mouth  daily., Disp: 30 tablet, Rfl: 1   triamcinolone cream (KENALOG) 0.1 %, Apply 1 application  topically 2 (two) times daily as needed (rash)., Disp: , Rfl:    warfarin (COUMADIN) 5 MG tablet, Take 5 mg by mouth at bedtime., Disp: , Rfl:  Allergies  Allergen Reactions   Dilaudid [Hydromorphone Hcl] Other (See Comments)    Makes pt hyper    Hydromorphone Other (See Comments)    hyperactiviity Other reaction(s): made him wild Other reaction(s): Unknown   Morphine Sulfate     Other reaction(s): at high dose causes confusion Other reaction(s): at high dose causes confusion Other reaction(s): at high dose causes confusion, Other (See Comments) Other reaction(s): at high dose causes confusion Other reaction(s): at high dose causes confusion    Tegretol [Carbamazepine] Hives   Carbamazepine Hives, Rash and Other (See Comments)    Other reaction(s): hives Other reaction(s): Hives Other reaction(s): hives     Social History   Socioeconomic History  Marital status: Married    Spouse name: Not on file   Number of children: Not on file   Years of education: Not on file   Highest education level: Not on file  Occupational History   Not on file  Tobacco Use   Smoking status: Former    Packs/day: 3.00    Years: 48.00    Total pack years: 144.00    Types: Cigarettes    Quit date: 2000    Years since quitting: 24.0   Smokeless tobacco: Never  Vaping Use   Vaping Use: Never used  Substance and Sexual Activity   Alcohol use: Never   Drug use: Never   Sexual activity: Never  Other Topics Concern   Not on file  Social History Narrative   ** Merged History Encounter **       Social Determinants of Health   Financial Resource Strain: Medium Risk (10/26/2021)   Overall Financial Resource Strain (CARDIA)    Difficulty of Paying Living Expenses: Somewhat hard  Food Insecurity: No Food Insecurity (02/04/2022)   Hunger Vital Sign    Worried About Running Out of Food in the Last Year:  Never true    Ran Out of Food in the Last Year: Never true  Transportation Needs: Unmet Transportation Needs (02/04/2022)   PRAPARE - Hydrologist (Medical): Yes    Lack of Transportation (Non-Medical): No  Physical Activity: Inactive (12/15/2019)   Exercise Vital Sign    Days of Exercise per Week: 0 days    Minutes of Exercise per Session: 0 min  Stress: Stress Concern Present (09/26/2018)   [place]    Feeling of Stress : To some extent  Social Connections: Not on file  Intimate Partner Violence: Not At Risk (02/04/2022)   Humiliation, Afraid, Rape, and Kick questionnaire    Fear of Current or Ex-Partner: No    Emotionally Abused: No    Physically Abused: No    Sexually Abused: No    Physical Exam      Future Appointments  Date Time Provider [place]  03/17/2022  1:30 PM [facility] None  04/17/2022  4:00 PM [name], MD [facility]  05/09/2022  7:05 AM [facility] DEVICE REMOTES [facility]  05/17/2022  2:00 PM [name], MD [facility]  05/19/2022  2:30 PM [name], MD [facility]  08/08/2022  7:05 AM [facility] DEVICE REMOTES [facility]  11/07/2022  7:10 AM [facility] DEVICE REMOTES [facility]  02/06/2023  7:05 AM [facility] DEVICE REMOTES [facility]  05/08/2023  7:05 AM [facility] DEVICE REMOTES [facility]  08/07/2023  7:05 AM [facility] DEVICE REMOTES [facility]  11/06/2023  7:05 AM [facility] DEVICE REMOTES [facility]  02/05/2024  7:05 AM [facility] DEVICE REMOTES [facility]

## 2022-03-17 ENCOUNTER — Ambulatory Visit: Payer: Medicare PPO | Attending: Cardiology

## 2022-03-17 DIAGNOSIS — I4891 Unspecified atrial fibrillation: Secondary | ICD-10-CM | POA: Diagnosis not present

## 2022-03-17 DIAGNOSIS — Z7901 Long term (current) use of anticoagulants: Secondary | ICD-10-CM

## 2022-03-17 LAB — POCT INR: INR: 4.7 — AB (ref 2.0–3.0)

## 2022-03-17 NOTE — Patient Instructions (Signed)
Description   Hold today's dose and tomorrow's dose and then continue taking warfarin 1 tablet (56m) daily.  Stay consistent with greens (1-2 times per week).   Recheck INR in 2 weeks.   [facility] [phone]

## 2022-03-22 ENCOUNTER — Other Ambulatory Visit (HOSPITAL_COMMUNITY): Payer: Self-pay

## 2022-03-22 NOTE — Progress Notes (Signed)
Paramedicine Encounter    Patient ID: Ruben Murray, male    DOB: 06-24-1945, 77 y.o.   MRN: [id number]   Complaints-shortness of breath on exertion  Assessment- A&Ox4, warm and dry, slightly short of breath on exertion, no lower leg swelling, 2lbs weight gain over night.   Compliance with meds- no missed doses over the last week but did not hold warfarin last week as instructed. Will plan to hold tonight and tomorrow.   Pill box filled- for one week.   Refills needed- [facility]   Meds changes since last visit- Hold warfarin for two nights per [facility].     Social changes- wife passed away two weeks ago, he is doing well given the circumstances.    BP (!) 110/58   Pulse 76   Resp 18   Wt 229 lb (103.9 kg)   SpO2 96%   BMI 34.82 kg/m  Weight yesterday-227lbs Last visit weight-230lbs Weight today- 230lbs    Arrived for home visit for Ruben Murray he was A&Ox4, warm and dry. He was seated and began walking into the kitchen to sit at the table and noted to have some slight shortness of breath while walking. He weighed today and noted to have a 2 lbs weight gain over night. He reports trying to do better on his diet but is limited due to not being able to cook himself so neighbors are bringing him food- they are aware to keep things low sodium. I reminded him about fluid restrictions. He agreed.   -Vitals obtained and as noted.  -No lower leg swelling -Lungs clear -Meds reviewed and pill box filled for one week.  -He was supposed to hold warfarin two nights last week per coumadin clinic but did not do this. I suggested holding tonight and tomorrow night. I did not place in pill box for tonight and tomorrow night.   -We reviewed appointments and confirmed same.   -Refills as noted.   -I plan to see him in one week.  Home visit complete.    [name], [place]  ACTION: Home visit completed    Patient  Care Team: [name], MD as PCP - General (Family Medicine) [name], MD as PCP - Cardiology (Cardiology) [name], MD as PCP - Electrophysiology (Cardiology) [name], MD (Family Medicine) [place] as Case Manager (Hospice and Palliative Medicine)  Patient Active Problem List   Diagnosis Date Noted   CHF (congestive heart failure) ([place]) 02/03/2022   Atrial fibrillation ([place]) 11/02/2021   Long term (current) use of anticoagulants 11/02/2021   AKI (acute kidney injury) ([place]) 07/27/2021   Acute pulmonary embolism ([place]) 07/26/2021   Acute on chronic systolic CHF (congestive heart failure) ([place]) 05/17/2021   Hypercoagulable state ([place]) 05/10/2021   Left leg pain 04/24/2021   OSA on CPAP 04/24/2021   Mitral regurgitation 04/24/2021   Tricuspid regurgitation 04/24/2021   Dilated aortic root ([place]) 04/24/2021   Permanent atrial fibrillation ([place]) 04/19/2021   Chronic arthropathy 04/13/2020   Callus 04/13/2020   Onychomycosis 12/12/2019   Left leg cellulitis 71/69/6789   Chronic systolic heart failure ([place]) 12/09/2019   Dilated cardiomyopathy ([place])    Atrial flutter ([place]) 11/03/2019   Atrial flutter with rapid ventricular response ([place]) 11/03/2019   Acquired thrombophilia ([place]) 01/15/2019   Atrial fibrillation with rapid ventricular response ([place]) 10/29/2017   Chronic combined systolic and diastolic heart failure ([place]) 10/29/2017   Prolonged  QT interval 10/29/2017   Sensorineural hearing loss (SNHL) of both ears 01/31/2017   Dysphonia 10/03/2016   Laryngopharyngeal reflux (LPR) 10/03/2016   Nasal polyps 10/03/2016   Rhinitis medicamentosa 10/03/2016   Chronic laryngitis 10/03/2016   Hoarseness 08/31/2016   Moderate persistent asthma 08/02/2016   COPD (chronic obstructive pulmonary disease) ([place]) 08/02/2016   Morbid (severe) obesity due to excess calories ([place]) 08/02/2016   Community acquired pneumonia 03/06/2016   Essential hypertension  03/06/2016   Mixed hyperlipidemia 03/06/2016   Right thyroid nodule 03/06/2016   Seizures ([place]) 03/06/2016   Fever 03/12/2015   Recurrent erosion of cornea 06/23/2011    Current Outpatient Medications:    acetaminophen (TYLENOL) 500 MG tablet, Take 1,000 mg by mouth at bedtime., Disp: , Rfl:    amiodarone (PACERONE) 200 MG tablet, Take 1 tablet (200 mg total) by mouth 2 (two) times daily., Disp: 60 tablet, Rfl: 11   busPIRone (BUSPAR) 5 MG tablet, Take 5 mg by mouth 2 (two) times daily., Disp: , Rfl:    Carboxymethylcellulose Sod PF 0.5 % SOLN, Place 1 drop into both eyes daily as needed (dry eyes)., Disp: , Rfl:    Cholecalciferol (VITAMIN D3) 25 MCG (1000 UT) CAPS, Take 1,000 Units by mouth daily., Disp: , Rfl:    escitalopram (LEXAPRO) 20 MG tablet, Take 20 mg by mouth at bedtime., Disp: , Rfl:    finasteride (PROSCAR) 5 MG tablet, Take 1 tablet (5 mg total) by mouth daily at 12 noon., Disp: 90 tablet, Rfl: 1   fluticasone (FLONASE) 50 MCG/ACT nasal spray, Place 1 spray into both nostrils 2 (two) times daily., Disp: , Rfl:    JARDIANCE 10 MG TABS tablet, TAKE 1 TABLET BY MOUTH DAILY BEFORE BREAKFAST., Disp: 90 tablet, Rfl: 3   loratadine (CLARITIN) 10 MG tablet, Take 10 mg by mouth every morning., Disp: , Rfl:    losartan (COZAAR) 25 MG tablet, TAKE 1/2 TABLET BY MOUTH EVERY DAY, Disp: 45 tablet, Rfl: 3   metoprolol succinate (TOPROL-XL) 100 MG 24 hr tablet, Take 1 tablet (100 mg total) by mouth 2 (two) times daily. Take with or immediately following a meal., Disp: 180 tablet, Rfl: 3   Multiple Vitamin (MULTIVITAMIN WITH MINERALS) TABS tablet, Take 1 tablet by mouth every morning., Disp: , Rfl:    PHENobarbital (LUMINAL) 97.2 MG tablet, Take 97.2 mg by mouth every evening. Takes between 3:00-5:00pm., Disp: , Rfl:    potassium chloride SA (KLOR-CON M) 20 MEQ tablet, Take 1 tablet (20 mEq total) by mouth daily., Disp: 90 tablet, Rfl: 3   pravastatin (PRAVACHOL) 40 MG tablet, Take 40 mg by  mouth at bedtime. , Disp: , Rfl:    spironolactone (ALDACTONE) 25 MG tablet, Take 0.5 tablets (12.5 mg total) by mouth daily., Disp: 45 tablet, Rfl: 2   torsemide 60 MG TABS, Take 60 mg by mouth daily., Disp: 30 tablet, Rfl: 1   triamcinolone cream (KENALOG) 0.1 %, Apply 1 application  topically 2 (two) times daily as needed (rash)., Disp: , Rfl:    warfarin (COUMADIN) 5 MG tablet, Take 5 mg by mouth at bedtime., Disp: , Rfl:  Allergies  Allergen Reactions   Dilaudid [Hydromorphone Hcl] Other (See Comments)    Makes pt hyper    Hydromorphone Other (See Comments)    hyperactiviity Other reaction(s): made him wild Other reaction(s): Unknown   Morphine Sulfate     Other reaction(s): at high dose causes confusion Other reaction(s): at high dose causes confusion Other reaction(s): at  high dose causes confusion, Other (See Comments) Other reaction(s): at high dose causes confusion Other reaction(s): at high dose causes confusion    Tegretol [Carbamazepine] Hives   Carbamazepine Hives, Rash and Other (See Comments)    Other reaction(s): hives Other reaction(s): Hives Other reaction(s): hives     Social History   Socioeconomic History   Marital status: Married    Spouse name: Not on file   Number of children: Not on file   Years of education: Not on file   Highest education level: Not on file  Occupational History   Not on file  Tobacco Use   Smoking status: Former    Packs/day: 3.00    Years: 48.00    Total pack years: 144.00    Types: Cigarettes    Quit date: 2000    Years since quitting: 24.0   Smokeless tobacco: Never  Vaping Use   Vaping Use: Never used  Substance and Sexual Activity   Alcohol use: Never   Drug use: Never   Sexual activity: Never  Other Topics Concern   Not on file  Social History Narrative   ** Merged History Encounter **       Social Determinants of Health   Financial Resource Strain: Medium Risk (10/26/2021)   Overall Financial Resource  Strain (CARDIA)    Difficulty of Paying Living Expenses: Somewhat hard  Food Insecurity: No Food Insecurity (02/04/2022)   Hunger Vital Sign    Worried About Running Out of Food in the Last Year: Never true    Ran Out of Food in the Last Year: Never true  Transportation Needs: Unmet Transportation Needs (02/04/2022)   PRAPARE - Hydrologist (Medical): Yes    Lack of Transportation (Non-Medical): No  Physical Activity: Inactive (12/15/2019)   Exercise Vital Sign    Days of Exercise per Week: 0 days    Minutes of Exercise per Session: 0 min  Stress: Stress Concern Present (09/26/2018)   [place]    Feeling of Stress : To some extent  Social Connections: Not on file  Intimate Partner Violence: Not At Risk (02/04/2022)   Humiliation, Afraid, Rape, and Kick questionnaire    Fear of Current or Ex-Partner: No    Emotionally Abused: No    Physically Abused: No    Sexually Abused: No    Physical Exam      Future Appointments  Date Time Provider [place]  03/31/2022  2:00 PM [facility] None  04/17/2022  4:00 PM [name], MD [facility]  05/09/2022  7:05 AM [facility] DEVICE REMOTES [facility]  05/17/2022  2:00 PM [name], MD [facility]  05/19/2022  2:30 PM [name], MD [facility]  08/08/2022  7:05 AM [facility]ICE [facility]OT[facility]  11/07/2022  7:10 AM [facility]OT[facility]  02/06/2023  7:05 AM [facility]OT[facility]  05/08/2023  7:05 AM [facility]OT[facility]  08/07/2023  7:05 AM [facility]  11/06/2023  7:05 AM [facility]ICE REMOTES [facility]  02/05/2024  7:05 AM [facility]ICE REMOTES [facility]

## 2022-03-29 ENCOUNTER — Telehealth (HOSPITAL_COMMUNITY): Payer: Self-pay | Admitting: Licensed Clinical Social Worker

## 2022-03-29 ENCOUNTER — Other Ambulatory Visit (HOSPITAL_COMMUNITY): Payer: Self-pay

## 2022-03-29 NOTE — Telephone Encounter (Signed)
H&V Care Navigation CSW Progress Note  Clinical Social Worker consulted to help connect pt with grief counseling after the loss of his wife.  Discussed options with pt and he is agreeable to connecting with [facility] bereavement counseling- number provided for pt to complete assessment.   SDOH Screenings   Food Insecurity: No Food Insecurity (02/04/2022)  Housing: Medium Risk (02/04/2022)  Transportation Needs: Unmet Transportation Needs (02/04/2022)  Utilities: Not At Risk (02/04/2022)  Depression (PHQ2-9): Low Risk  (04/26/2021)  Financial Resource Strain: Medium Risk (10/26/2021)  Physical Activity: Inactive (12/15/2019)  Stress: Stress Concern Present (09/26/2018)  Tobacco Use: Medium Risk (02/06/2022)    Jorge Ny, LCSW Clinical Social Worker [facility] Desk#: [phone] Cell#: [phone]

## 2022-03-29 NOTE — Progress Notes (Signed)
Paramedicine Encounter    Patient ID: Ruben Murray, male    DOB: May 21, 1945, 77 y.o.   MRN: [id number]   Complaints- depressed, emotionally upset, unsteady on his feet.   Assessment- CAOX4, warm and dry seated in his chair crying, visibly emotional about the passing of his wife, no lower leg swelling, 4lbs weight gain in one week, lungs clear.   Compliance with meds- no missed doses of medications over the last week.   Pill box filled for one week.   Refills needed- over the counter tylenol and multivitamin  Meds changes since last visit- NONE     Social changes- NONE    BP 110/80   Pulse 72   Resp 16   Wt 232 lb (105.2 kg)   SpO2 96%   BMI 35.28 kg/m  Weight yesterday-didn't weigh Last visit weight-229lbs Weight today- 232lbs  Arrived for home visit for Ruben Murray where he was seated in his chair alert and oriented, crying, very sad, emotionally upset and reporting to be feeling depressed. He states today he just woke up crying and feeling very sad. I offered words of encouragement as well as an offer for some grief counseling connections and he agreed- I spoke to [place], LCSW about same. She connected him with [facility] and I will ensure he follows up if he is willing.   I obtained vitals and assessment. Ruben Murray seemed slightly short of breath on exertion and very unsteady on his feet. I placed the rollator in front of him and advised him he must use it to ensure he doesn't fall. He agreed with this. Lungs clear, no lower leg edema. He did have some weight gain but tells me he has been eating cookies, popcorn, coffee, ice cream and only one meal a day. I talked with him about heart healthy options and reduced sodium options. He agreed and said he will try to do better. I asked him if he would be interested in frozen meal delivery and he states he is not interested at present but will let me know if he becomes interested.   I reviewed meds and filled pill box for  one week. Refills are Tylenol and Multivitamin he will pick up over the counter.   Appointments reviewed and wrote down.   Ruben Murray was grateful for our discussion and expressed his appreciation for me. I advised him I'm here if he needs me and to reach out if so. He agreed.   Home visit complete. I will see him in one week.      [name], [place]  ACTION: Home visit completed    Patient Care Team: [name], MD as PCP - General (Family Medicine) [name], MD as PCP - Cardiology (Cardiology) [name], MD as PCP - Electrophysiology (Cardiology) [name], MD (Family Medicine) [place] as Case Manager [facility] and Palliative Medicine)  Patient Active Problem List   Diagnosis Date Noted   CHF (congestive heart failure) ([place]) 02/03/2022   Atrial fibrillation ([place]) 11/02/2021   Long term (current) use of anticoagulants 11/02/2021   AKI (acute kidney injury) ([place]) 07/27/2021   Acute pulmonary embolism ([place]) 07/26/2021   Acute on chronic systolic CHF (congestive heart failure) ([place]) 05/17/2021   Hypercoagulable state ([place]) 05/10/2021   Left leg pain 04/24/2021   OSA on CPAP 04/24/2021   Mitral regurgitation 04/24/2021   Tricuspid regurgitation 04/24/2021   Dilated aortic root ([place]) 04/24/2021   Permanent atrial fibrillation (  [place]) 04/19/2021   Chronic arthropathy 04/13/2020   Callus 04/13/2020   Onychomycosis 12/12/2019   Left leg cellulitis 82/95/6213   Chronic systolic heart failure ([place]) 12/09/2019   Dilated cardiomyopathy ([place])    Atrial flutter ([place]) 11/03/2019   Atrial flutter with rapid ventricular response ([place]) 11/03/2019   Acquired thrombophilia ([place]) 01/15/2019   Atrial fibrillation with rapid ventricular response ([place]) 10/29/2017   Chronic combined systolic and diastolic heart failure ([place]) 10/29/2017   Prolonged QT interval 10/29/2017   Sensorineural hearing loss (SNHL) of both ears  01/31/2017   Dysphonia 10/03/2016   Laryngopharyngeal reflux (LPR) 10/03/2016   Nasal polyps 10/03/2016   Rhinitis medicamentosa 10/03/2016   Chronic laryngitis 10/03/2016   Hoarseness 08/31/2016   Moderate persistent asthma 08/02/2016   COPD (chronic obstructive pulmonary disease) ([place]) 08/02/2016   Morbid (severe) obesity due to excess calories ([place]) 08/02/2016   Community acquired pneumonia 03/06/2016   Essential hypertension 03/06/2016   Mixed hyperlipidemia 03/06/2016   Right thyroid nodule 03/06/2016   Seizures ([place]) 03/06/2016   Fever 03/12/2015   Recurrent erosion of cornea 06/23/2011    Current Outpatient Medications:    acetaminophen (TYLENOL) 500 MG tablet, Take 1,000 mg by mouth at bedtime., Disp: , Rfl:    amiodarone (PACERONE) 200 MG tablet, Take 1 tablet (200 mg total) by mouth 2 (two) times daily., Disp: 60 tablet, Rfl: 11   busPIRone (BUSPAR) 5 MG tablet, Take 5 mg by mouth 2 (two) times daily., Disp: , Rfl:    Carboxymethylcellulose Sod PF 0.5 % SOLN, Place 1 drop into both eyes daily as needed (dry eyes)., Disp: , Rfl:    Cholecalciferol (VITAMIN D3) 25 MCG (1000 UT) CAPS, Take 1,000 Units by mouth daily., Disp: , Rfl:    escitalopram (LEXAPRO) 20 MG tablet, Take 20 mg by mouth at bedtime., Disp: , Rfl:    finasteride (PROSCAR) 5 MG tablet, Take 1 tablet (5 mg total) by mouth daily at 12 noon., Disp: 90 tablet, Rfl: 1   fluticasone (FLONASE) 50 MCG/ACT nasal spray, Place 1 spray into both nostrils 2 (two) times daily., Disp: , Rfl:    JARDIANCE 10 MG TABS tablet, TAKE 1 TABLET BY MOUTH DAILY BEFORE BREAKFAST., Disp: 90 tablet, Rfl: 3   loratadine (CLARITIN) 10 MG tablet, Take 10 mg by mouth every morning., Disp: , Rfl:    losartan (COZAAR) 25 MG tablet, TAKE 1/2 TABLET BY MOUTH EVERY DAY, Disp: 45 tablet, Rfl: 3   metoprolol succinate (TOPROL-XL) 100 MG 24 hr tablet, Take 1 tablet (100 mg total) by mouth 2 (two) times daily. Take with or immediately following a  meal., Disp: 180 tablet, Rfl: 3   Multiple Vitamin (MULTIVITAMIN WITH MINERALS) TABS tablet, Take 1 tablet by mouth every morning., Disp: , Rfl:    PHENobarbital (LUMINAL) 97.2 MG tablet, Take 97.2 mg by mouth every evening. Takes between 3:00-5:00pm., Disp: , Rfl:    potassium chloride SA (KLOR-CON M) 20 MEQ tablet, Take 1 tablet (20 mEq total) by mouth daily., Disp: 90 tablet, Rfl: 3   pravastatin (PRAVACHOL) 40 MG tablet, Take 40 mg by mouth at bedtime. , Disp: , Rfl:    spironolactone (ALDACTONE) 25 MG tablet, Take 0.5 tablets (12.5 mg total) by mouth daily., Disp: 45 tablet, Rfl: 2   torsemide 60 MG TABS, Take 60 mg by mouth daily., Disp: 30 tablet, Rfl: 1   triamcinolone cream (KENALOG) 0.1 %, Apply 1 application  topically 2 (two) times daily as needed (rash)., Disp: , Rfl:  warfarin (COUMADIN) 5 MG tablet, Take 5 mg by mouth at bedtime., Disp: , Rfl:  Allergies  Allergen Reactions   Dilaudid [Hydromorphone Hcl] Other (See Comments)    Makes pt hyper    Hydromorphone Other (See Comments)    hyperactiviity Other reaction(s): made him wild Other reaction(s): Unknown   Morphine Sulfate     Other reaction(s): at high dose causes confusion Other reaction(s): at high dose causes confusion Other reaction(s): at high dose causes confusion, Other (See Comments) Other reaction(s): at high dose causes confusion Other reaction(s): at high dose causes confusion    Tegretol [Carbamazepine] Hives   Carbamazepine Hives, Rash and Other (See Comments)    Other reaction(s): hives Other reaction(s): Hives Other reaction(s): hives     Social History   Socioeconomic History   Marital status: Married    Spouse name: Not on file   Number of children: Not on file   Years of education: Not on file   Highest education level: Not on file  Occupational History   Not on file  Tobacco Use   Smoking status: Former    Packs/day: 3.00    Years: 48.00    Total pack years: 144.00    Types:  Cigarettes    Quit date: 2000    Years since quitting: 24.0   Smokeless tobacco: Never  Vaping Use   Vaping Use: Never used  Substance and Sexual Activity   Alcohol use: Never   Drug use: Never   Sexual activity: Never  Other Topics Concern   Not on file  Social History Narrative   ** Merged History Encounter **       Social Determinants of Health   Financial Resource Strain: Medium Risk (10/26/2021)   Overall Financial Resource Strain (CARDIA)    Difficulty of Paying Living Expenses: Somewhat hard  Food Insecurity: No Food Insecurity (02/04/2022)   Hunger Vital Sign    Worried About Running Out of Food in the Last Year: Never true    Ran Out of Food in the Last Year: Never true  Transportation Needs: Unmet Transportation Needs (02/04/2022)   PRAPARE - Hydrologist (Medical): Yes    Lack of Transportation (Non-Medical): No  Physical Activity: Inactive (12/15/2019)   Exercise Vital Sign    Days of Exercise per Week: 0 days    Minutes of Exercise per Session: 0 min  Stress: Stress Concern Present (09/26/2018)   [place]    Feeling of Stress : To some extent  Social Connections: Not on file  Intimate Partner Violence: Not At Risk (02/04/2022)   Humiliation, Afraid, Rape, and Kick questionnaire    Fear of Current or Ex-Partner: No    Emotionally Abused: No    Physically Abused: No    Sexually Abused: No    Physical Exam      Future Appointments  Date Time Provider [place]  03/31/2022  2:00 PM [facility] None  04/17/2022  4:00 PM [name], MD [facility]  05/09/2022  7:05 AM [facility] DEVICE REMOTES [facility]  05/17/2022  2:00 PM [name], MD [facility]  05/19/2022  2:30 PM [name], MD [facility]  08/08/2022  7:05 AM [facility] DEVICE REMOTES [facility]  11/07/2022  7:10 AM [facility] DEVICE REMOTES [facility]  02/06/2023  7:05 AM [facility] DEVICE REMOTES [facility]  05/08/2023  7:05 AM [facility] DEVICE REMOTES [facility]  08/07/2023  7:05 AM [facility] DEVICE REMOTES [facility]  11/06/2023  7:05 AM [facility] DEVICE REMOTES [facility]  02/05/2024  7:05 AM [facility] DEVICE REMOTES [facility]

## 2022-03-31 ENCOUNTER — Ambulatory Visit: Payer: No Typology Code available for payment source | Attending: Cardiovascular Disease

## 2022-03-31 DIAGNOSIS — Z5181 Encounter for therapeutic drug level monitoring: Secondary | ICD-10-CM

## 2022-03-31 DIAGNOSIS — Z7901 Long term (current) use of anticoagulants: Secondary | ICD-10-CM

## 2022-03-31 DIAGNOSIS — I4891 Unspecified atrial fibrillation: Secondary | ICD-10-CM | POA: Diagnosis not present

## 2022-03-31 LAB — POCT INR: INR: 2.4 (ref 2.0–3.0)

## 2022-03-31 NOTE — Patient Instructions (Signed)
continue taking warfarin 1 tablet (5mg ) daily.  Stay consistent with greens (1-2 times per week).   Recheck INR in 4 weeks.   [facility] [phone]

## 2022-04-04 DIAGNOSIS — Z09 Encounter for follow-up examination after completed treatment for conditions other than malignant neoplasm: Secondary | ICD-10-CM | POA: Diagnosis not present

## 2022-04-04 DIAGNOSIS — E7800 Pure hypercholesterolemia, unspecified: Secondary | ICD-10-CM | POA: Diagnosis not present

## 2022-04-04 DIAGNOSIS — G40909 Epilepsy, unspecified, not intractable, without status epilepticus: Secondary | ICD-10-CM | POA: Diagnosis not present

## 2022-04-04 DIAGNOSIS — I5023 Acute on chronic systolic (congestive) heart failure: Secondary | ICD-10-CM | POA: Diagnosis not present

## 2022-04-04 DIAGNOSIS — E1122 Type 2 diabetes mellitus with diabetic chronic kidney disease: Secondary | ICD-10-CM | POA: Diagnosis not present

## 2022-04-04 DIAGNOSIS — I480 Paroxysmal atrial fibrillation: Secondary | ICD-10-CM | POA: Diagnosis not present

## 2022-04-04 DIAGNOSIS — F331 Major depressive disorder, recurrent, moderate: Secondary | ICD-10-CM | POA: Diagnosis not present

## 2022-04-04 DIAGNOSIS — I10 Essential (primary) hypertension: Secondary | ICD-10-CM | POA: Diagnosis not present

## 2022-04-04 DIAGNOSIS — Z7901 Long term (current) use of anticoagulants: Secondary | ICD-10-CM | POA: Diagnosis not present

## 2022-04-04 DIAGNOSIS — R2681 Unsteadiness on feet: Secondary | ICD-10-CM | POA: Diagnosis not present

## 2022-04-05 ENCOUNTER — Other Ambulatory Visit (HOSPITAL_COMMUNITY): Payer: Self-pay

## 2022-04-05 MED ORDER — TORSEMIDE 20 MG PO TABS: 60.0000 mg | ORAL_TABLET | Freq: Every day | ORAL | 2 refills | Status: DC

## 2022-04-05 NOTE — Progress Notes (Signed)
Paramedicine Encounter    Patient ID: Ruben Murray, male    DOB: October 29, 1945, 77 y.o.   MRN: [id number]   Complaints- NONE   Assessment- Ambulatory, alert and oriented, no swelling in lower legs, 5lb weight gain over the last week, no shortness of breath, no shortness of breath or chest pain.   Compliance with meds- missed one night dose and one morning dose.   Pill box filled-for one week.  Refills needed- Torsemide   Meds changes since last visit- NONE     Social changes- NONE    BP 110/62   Pulse 97   Resp 18   Wt 237 lb (107.5 kg)   SpO2 96%   BMI 36.04 kg/m  Weight yesterday-235lbs Last visit weight-232lbs  Arrived for home visit for Ruben Murray who reports to be feeling okay today. He denied any complaints of shortness of breath, dizziness, chest pain, swelling.  He has had a 5lb weight gain over the last week but missed one morning dose and one night dose of his medications. I reminded him the importance of taking all his medications as prescribed. He also reports eating salty snacks and only eating one meal daily. I offered to suggest some heart healthy options for him however he declined.   I reviewed meds and filled pill box for one week.   Torsemide refill request sent to HF triage.   Appointments reviewed.    I gave him his morning meds during visit.   Home visit complete.    [name], [place]  ACTION: Home visit completed    Patient Care Team: [name], MD as PCP - General (Family Medicine) [name], MD as PCP - Cardiology (Cardiology) [name], MD as PCP - Electrophysiology (Cardiology) [name], MD (Family Medicine) [place], [facility] as Case Manager (Hospice and Palliative Medicine)  Patient Active Problem List   Diagnosis Date Noted   CHF (congestive heart failure) ([place]) 02/03/2022   Atrial fibrillation ([place]) 11/02/2021   Long term (current) use of anticoagulants 11/02/2021    AKI (acute kidney injury) ([place]) 07/27/2021   Acute pulmonary embolism ([place]) 07/26/2021   Acute on chronic systolic CHF (congestive heart failure) ([place]) 05/17/2021   Hypercoagulable state ([place]) 05/10/2021   Left leg pain 04/24/2021   OSA on CPAP 04/24/2021   Mitral regurgitation 04/24/2021   Tricuspid regurgitation 04/24/2021   Dilated aortic root ([place]) 04/24/2021   Permanent atrial fibrillation ([place]) 04/19/2021   Chronic arthropathy 04/13/2020   Callus 04/13/2020   Onychomycosis 12/12/2019   Left leg cellulitis 40/98/1191   Chronic systolic heart failure ([place]) 12/09/2019   Dilated cardiomyopathy ([place])    Atrial flutter ([place]) 11/03/2019   Atrial flutter with rapid ventricular response ([place]) 11/03/2019   Acquired thrombophilia ([place]) 01/15/2019   Atrial fibrillation with rapid ventricular response ([place]) 10/29/2017   Chronic combined systolic and diastolic heart failure ([place]) 10/29/2017   Prolonged QT interval 10/29/2017   Sensorineural hearing loss (SNHL) of both ears 01/31/2017   Dysphonia 10/03/2016   Laryngopharyngeal reflux (LPR) 10/03/2016   Nasal polyps 10/03/2016   Rhinitis medicamentosa 10/03/2016   Chronic laryngitis 10/03/2016   Hoarseness 08/31/2016   Moderate persistent asthma 08/02/2016   COPD (chronic obstructive pulmonary disease) ([place]) 08/02/2016   Morbid (severe) obesity due to excess calories ([place]) 08/02/2016   Community acquired pneumonia 03/06/2016   Essential hypertension 03/06/2016   Mixed hyperlipidemia 03/06/2016   Right thyroid nodule 03/06/2016   Seizures ([place]) 03/06/2016   Fever  03/12/2015   Recurrent erosion of cornea 06/23/2011    Current Outpatient Medications:    acetaminophen (TYLENOL) 500 MG tablet, Take 1,000 mg by mouth at bedtime., Disp: , Rfl:    amiodarone (PACERONE) 200 MG tablet, Take 1 tablet (200 mg total) by mouth 2 (two) times daily., Disp: 60 tablet, Rfl: 11   busPIRone (BUSPAR) 5 MG tablet, Take 5 mg by mouth 2 (two) times  daily., Disp: , Rfl:    Carboxymethylcellulose Sod PF 0.5 % SOLN, Place 1 drop into both eyes daily as needed (dry eyes)., Disp: , Rfl:    Cholecalciferol (VITAMIN D3) 25 MCG (1000 UT) CAPS, Take 1,000 Units by mouth daily., Disp: , Rfl:    escitalopram (LEXAPRO) 20 MG tablet, Take 20 mg by mouth at bedtime., Disp: , Rfl:    finasteride (PROSCAR) 5 MG tablet, Take 1 tablet (5 mg total) by mouth daily at 12 noon., Disp: 90 tablet, Rfl: 1   fluticasone (FLONASE) 50 MCG/ACT nasal spray, Place 1 spray into both nostrils 2 (two) times daily., Disp: , Rfl:    JARDIANCE 10 MG TABS tablet, TAKE 1 TABLET BY MOUTH DAILY BEFORE BREAKFAST., Disp: 90 tablet, Rfl: 3   loratadine (CLARITIN) 10 MG tablet, Take 10 mg by mouth every morning., Disp: , Rfl:    losartan (COZAAR) 25 MG tablet, TAKE 1/2 TABLET BY MOUTH EVERY DAY, Disp: 45 tablet, Rfl: 3   metoprolol succinate (TOPROL-XL) 100 MG 24 hr tablet, Take 1 tablet (100 mg total) by mouth 2 (two) times daily. Take with or immediately following a meal., Disp: 180 tablet, Rfl: 3   Multiple Vitamin (MULTIVITAMIN WITH MINERALS) TABS tablet, Take 1 tablet by mouth every morning., Disp: , Rfl:    PHENobarbital (LUMINAL) 97.2 MG tablet, Take 97.2 mg by mouth every evening. Takes between 3:00-5:00pm., Disp: , Rfl:    potassium chloride SA (KLOR-CON M) 20 MEQ tablet, Take 1 tablet (20 mEq total) by mouth daily., Disp: 90 tablet, Rfl: 3   pravastatin (PRAVACHOL) 40 MG tablet, Take 40 mg by mouth at bedtime. , Disp: , Rfl:    spironolactone (ALDACTONE) 25 MG tablet, Take 0.5 tablets (12.5 mg total) by mouth daily., Disp: 45 tablet, Rfl: 2   torsemide 60 MG TABS, Take 60 mg by mouth daily., Disp: 30 tablet, Rfl: 1   triamcinolone cream (KENALOG) 0.1 %, Apply 1 application  topically 2 (two) times daily as needed (rash)., Disp: , Rfl:    warfarin (COUMADIN) 5 MG tablet, Take 5 mg by mouth at bedtime., Disp: , Rfl:  Allergies  Allergen Reactions   Dilaudid [Hydromorphone Hcl]  Other (See Comments)    Makes pt hyper    Hydromorphone Other (See Comments)    hyperactiviity Other reaction(s): made him wild Other reaction(s): Unknown   Morphine Sulfate     Other reaction(s): at high dose causes confusion Other reaction(s): at high dose causes confusion Other reaction(s): at high dose causes confusion, Other (See Comments) Other reaction(s): at high dose causes confusion Other reaction(s): at high dose causes confusion    Tegretol [Carbamazepine] Hives   Carbamazepine Hives, Rash and Other (See Comments)    Other reaction(s): hives Other reaction(s): Hives Other reaction(s): hives     Social History   Socioeconomic History   Marital status: Married    Spouse name: Not on file   Number of children: Not on file   Years of education: Not on file   Highest education level: Not on file  Occupational History  Not on file  Tobacco Use   Smoking status: Former    Packs/day: 3.00    Years: 48.00    Total pack years: 144.00    Types: Cigarettes    Quit date: 2000    Years since quitting: 24.0   Smokeless tobacco: Never  Vaping Use   Vaping Use: Never used  Substance and Sexual Activity   Alcohol use: Never   Drug use: Never   Sexual activity: Never  Other Topics Concern   Not on file  Social History Narrative   ** Merged History Encounter **       Social Determinants of Health   Financial Resource Strain: Medium Risk (10/26/2021)   Overall Financial Resource Strain (CARDIA)    Difficulty of Paying Living Expenses: Somewhat hard  Food Insecurity: No Food Insecurity (02/04/2022)   Hunger Vital Sign    Worried About Running Out of Food in the Last Year: Never true    Ran Out of Food in the Last Year: Never true  Transportation Needs: Unmet Transportation Needs (02/04/2022)   PRAPARE - Hydrologist (Medical): Yes    Lack of Transportation (Non-Medical): No  Physical Activity: Inactive (12/15/2019)   Exercise Vital Sign     Days of Exercise per Week: 0 days    Minutes of Exercise per Session: 0 min  Stress: Stress Concern Present (09/26/2018)   [place]    Feeling of Stress : To some extent  Social Connections: Not on file  Intimate Partner Violence: Not At Risk (02/04/2022)   Humiliation, Afraid, Rape, and Kick questionnaire    Fear of Current or Ex-Partner: No    Emotionally Abused: No    Physically Abused: No    Sexually Abused: No    Physical Exam      Future Appointments  Date Time Provider [place]  04/17/2022  4:00 PM [name], MD [facility]  04/28/2022  2:00 PM [facility] None  05/09/2022  7:05 AM [facility] DEVICE REMOTES [facility]  05/17/2022  2:00 PM [name], MD [facility]  05/19/2022  2:30 PM [name], MD [facility]  08/08/2022  7:05 AM [facility] DEVICE REMOTES [facility]  11/07/2022  7:10 AM [facility] DEVICE REMOTES [facility]  02/06/2023  7:05 AM [facility] DEVICE REMOTES [facility]  05/08/2023  7:05 AM [facility] DEVICE REMOTES [facility]  08/07/2023  7:05 AM [facility] DEVICE REMOTES [facility]  11/06/2023  7:05 AM [facility] DEVICE REMOTES [facility]  02/05/2024  7:05 AM [facility] DEVICE REMOTES [facility]

## 2022-04-12 ENCOUNTER — Other Ambulatory Visit (HOSPITAL_COMMUNITY): Payer: Self-pay

## 2022-04-12 NOTE — Progress Notes (Signed)
Paramedicine Encounter    Patient ID: Ruben Murray, male    DOB: 1946/01/02, 77 y.o.   MRN: [id number]   Complaints- none   Assessment- A&Ox4, warm and dry, no shortness of breath, no swelling, 3lbs weight gain overnight, lungs clear, not complying with heart healthy diet.   Compliance with meds- no missed doses of meds   Pill box filled- one week,   Refills needed- none   Meds changes since last visit- none     Social changes- none    BP 110/80   Pulse 70   Resp 16   Wt 237 lb (107.5 kg)   SpO2 96%   BMI 36.04 kg/m  Weight yesterday-234lbs Last visit weight-237lbs  Arrived for home visit for [facility] who reports to be feeling well denying any symptoms. He has no shortness of breath while ambulating noted during visit, vitals obtained as noted. Lungs clear. No lower leg swelling. He did have a 3 lbs weight gain over night. He admits to complying with heart healthy diet- he admits to snacking a lot and drinking >68oz of fluid daily. I reminded him the sodium and fluid restrictions he should be adhering to daily.  He verbalized understanding.   I reviewed meds and confirmed same. I filled pill box for one week. No refills needed.   Appointments reviewed and confirmed. Home visit complete.     [name], [place]  ACTION: Home visit completed    Patient Care Team: [name], MD as PCP - General (Family Medicine) [name], MD as PCP - Cardiology (Cardiology) [name], MD as PCP - Electrophysiology (Cardiology) [name], MD (Family Medicine) [place] as Case Manager (Hospice and Palliative Medicine)  Patient Active Problem List   Diagnosis Date Noted   CHF (congestive heart failure) ([place]) 02/03/2022   Atrial fibrillation ([place]) 11/02/2021   Long term (current) use of anticoagulants 11/02/2021   AKI (acute kidney injury) ([place]) 07/27/2021   Acute pulmonary embolism ([place]) 07/26/2021   Acute on  chronic systolic CHF (congestive heart failure) ([place]) 05/17/2021   Hypercoagulable state ([place]) 05/10/2021   Left leg pain 04/24/2021   OSA on CPAP 04/24/2021   Mitral regurgitation 04/24/2021   Tricuspid regurgitation 04/24/2021   Dilated aortic root ([place]) 04/24/2021   Permanent atrial fibrillation ([place]) 04/19/2021   Chronic arthropathy 04/13/2020   Callus 04/13/2020   Onychomycosis 12/12/2019   Left leg cellulitis 59/74/1638   Chronic systolic heart failure ([place]) 12/09/2019   Dilated cardiomyopathy ([place])    Atrial flutter ([place]) 11/03/2019   Atrial flutter with rapid ventricular response ([place]) 11/03/2019   Acquired thrombophilia ([place]) 01/15/2019   Atrial fibrillation with rapid ventricular response ([place]) 10/29/2017   Chronic combined systolic and diastolic heart failure ([place]) 10/29/2017   Prolonged QT interval 10/29/2017   Sensorineural hearing loss (SNHL) of both ears 01/31/2017   Dysphonia 10/03/2016   Laryngopharyngeal reflux (LPR) 10/03/2016   Nasal polyps 10/03/2016   Rhinitis medicamentosa 10/03/2016   Chronic laryngitis 10/03/2016   Hoarseness 08/31/2016   Moderate persistent asthma 08/02/2016   COPD (chronic obstructive pulmonary disease) ([place]) 08/02/2016   Morbid (severe) obesity due to excess calories ([place]) 08/02/2016   Community acquired pneumonia 03/06/2016   Essential hypertension 03/06/2016   Mixed hyperlipidemia 03/06/2016   Right thyroid nodule 03/06/2016   Seizures ([place]) 03/06/2016   Fever 03/12/2015   Recurrent erosion of cornea 06/23/2011    Current Outpatient Medications:    acetaminophen (TYLENOL) 500 MG tablet, Take  1,000 mg by mouth at bedtime., Disp: , Rfl:    amiodarone (PACERONE) 200 MG tablet, Take 1 tablet (200 mg total) by mouth 2 (two) times daily., Disp: 60 tablet, Rfl: 11   busPIRone (BUSPAR) 5 MG tablet, Take 5 mg by mouth 2 (two) times daily., Disp: , Rfl:    Carboxymethylcellulose Sod PF 0.5 % SOLN, Place 1 drop into both eyes daily as  needed (dry eyes)., Disp: , Rfl:    Cholecalciferol (VITAMIN D3) 25 MCG (1000 UT) CAPS, Take 1,000 Units by mouth daily., Disp: , Rfl:    escitalopram (LEXAPRO) 20 MG tablet, Take 20 mg by mouth at bedtime., Disp: , Rfl:    finasteride (PROSCAR) 5 MG tablet, Take 1 tablet (5 mg total) by mouth daily at 12 noon., Disp: 90 tablet, Rfl: 1   fluticasone (FLONASE) 50 MCG/ACT nasal spray, Place 1 spray into both nostrils 2 (two) times daily., Disp: , Rfl:    JARDIANCE 10 MG TABS tablet, TAKE 1 TABLET BY MOUTH DAILY BEFORE BREAKFAST., Disp: 90 tablet, Rfl: 3   loratadine (CLARITIN) 10 MG tablet, Take 10 mg by mouth every morning., Disp: , Rfl:    losartan (COZAAR) 25 MG tablet, TAKE 1/2 TABLET BY MOUTH EVERY DAY, Disp: 45 tablet, Rfl: 3   metoprolol succinate (TOPROL-XL) 100 MG 24 hr tablet, Take 1 tablet (100 mg total) by mouth 2 (two) times daily. Take with or immediately following a meal., Disp: 180 tablet, Rfl: 3   Multiple Vitamin (MULTIVITAMIN WITH MINERALS) TABS tablet, Take 1 tablet by mouth every morning., Disp: , Rfl:    PHENobarbital (LUMINAL) 97.2 MG tablet, Take 97.2 mg by mouth every evening. Takes between 3:00-5:00pm., Disp: , Rfl:    potassium chloride SA (KLOR-CON M) 20 MEQ tablet, Take 1 tablet (20 mEq total) by mouth daily., Disp: 90 tablet, Rfl: 3   pravastatin (PRAVACHOL) 40 MG tablet, Take 40 mg by mouth at bedtime. , Disp: , Rfl:    spironolactone (ALDACTONE) 25 MG tablet, Take 0.5 tablets (12.5 mg total) by mouth daily., Disp: 45 tablet, Rfl: 2   torsemide (DEMADEX) 20 MG tablet, Take 3 tablets (60 mg total) by mouth daily., Disp: 90 tablet, Rfl: 2   triamcinolone cream (KENALOG) 0.1 %, Apply 1 application  topically 2 (two) times daily as needed (rash)., Disp: , Rfl:    warfarin (COUMADIN) 5 MG tablet, Take 5 mg by mouth at bedtime., Disp: , Rfl:  Allergies  Allergen Reactions   Dilaudid [Hydromorphone Hcl] Other (See Comments)    Makes pt hyper    Hydromorphone Other (See  Comments)    hyperactiviity Other reaction(s): made him wild Other reaction(s): Unknown   Morphine Sulfate     Other reaction(s): at high dose causes confusion Other reaction(s): at high dose causes confusion Other reaction(s): at high dose causes confusion, Other (See Comments) Other reaction(s): at high dose causes confusion Other reaction(s): at high dose causes confusion    Tegretol [Carbamazepine] Hives   Carbamazepine Hives, Rash and Other (See Comments)    Other reaction(s): hives Other reaction(s): Hives Other reaction(s): hives     Social History   Socioeconomic History   Marital status: Married    Spouse name: Not on file   Number of children: Not on file   Years of education: Not on file   Highest education level: Not on file  Occupational History   Not on file  Tobacco Use   Smoking status: Former    Packs/day: 3.00  Years: 48.00    Total pack years: 144.00    Types: Cigarettes    Quit date: 2000    Years since quitting: 24.1   Smokeless tobacco: Never  Vaping Use   Vaping Use: Never used  Substance and Sexual Activity   Alcohol use: Never   Drug use: Never   Sexual activity: Never  Other Topics Concern   Not on file  Social History Narrative   ** Merged History Encounter **       Social Determinants of Health   Financial Resource Strain: Medium Risk (10/26/2021)   Overall Financial Resource Strain (CARDIA)    Difficulty of Paying Living Expenses: Somewhat hard  Food Insecurity: No Food Insecurity (02/04/2022)   Hunger Vital Sign    Worried About Running Out of Food in the Last Year: Never true    Ran Out of Food in the Last Year: Never true  Transportation Needs: Unmet Transportation Needs (02/04/2022)   PRAPARE - Hydrologist (Medical): Yes    Lack of Transportation (Non-Medical): No  Physical Activity: Inactive (12/15/2019)   Exercise Vital Sign    Days of Exercise per Week: 0 days    Minutes of Exercise per  Session: 0 min  Stress: Stress Concern Present (09/26/2018)   [place]    Feeling of Stress : To some extent  Social Connections: Not on file  Intimate Partner Violence: Not At Risk (02/04/2022)   Humiliation, Afraid, Rape, and Kick questionnaire    Fear of Current or Ex-Partner: No    Emotionally Abused: No    Physically Abused: No    Sexually Abused: No    Physical Exam      Future Appointments  Date Time Provider [place]  04/17/2022  4:00 PM [name], MD [facility]  04/28/2022  2:00 PM [facility] None  05/09/2022  7:05 AM [facility] DEVICE REMOTES [facility]  05/17/2022  2:00 PM [name], MD [facility]  05/19/2022  2:30 PM [name], MD [facility]  08/08/2022  7:05 AM [facility] DEVICE REMOTES [facility]  11/07/2022  7:10 AM [facility] DEVICE REMOTES [facility]  02/06/2023  7:05 AM [facility] DEVICE REMOTES [facility]  05/08/2023  7:05 AM [facility] DEVICE REMOTES [facility]  08/07/2023  7:05 AM [facility] DEVICE REMOTES [facility]  11/06/2023  7:05 AM [facility] DEVICE REMOTES [facility]  02/05/2024  7:05 AM [facility] DEVICE REMOTES [facility]

## 2022-04-17 ENCOUNTER — Encounter: Payer: Self-pay | Admitting: Cardiology

## 2022-04-17 ENCOUNTER — Ambulatory Visit: Payer: No Typology Code available for payment source | Attending: Cardiology | Admitting: Cardiology

## 2022-04-17 VITALS — BP 128/60 | HR 87 | Ht 68.0 in | Wt 242.8 lb

## 2022-04-17 DIAGNOSIS — D6869 Other thrombophilia: Secondary | ICD-10-CM

## 2022-04-17 DIAGNOSIS — I5022 Chronic systolic (congestive) heart failure: Secondary | ICD-10-CM | POA: Diagnosis not present

## 2022-04-17 DIAGNOSIS — I4821 Permanent atrial fibrillation: Secondary | ICD-10-CM

## 2022-04-17 NOTE — Patient Instructions (Addendum)
Medication Instructions:  Your physician has recommended you make the following change in your medication:  STOP Amiodarone  *If you need a refill on your cardiac medications before your next appointment, please call your pharmacy*   Lab Work: None ordered If you have labs (blood work) drawn today and your tests are completely normal, you will receive your results only by: [place] (if you have MyChart) OR A paper copy in the mail If you have any lab test that is abnormal or we need to change your treatment, we will call you to review the results.   Testing/Procedures: None ordered   Follow-Up: At [facility], you and your health needs are our priority.  As part of our continuing mission to provide you with exceptional heart care, we have created designated Provider Care Teams.  These Care Teams include your primary Cardiologist (physician) and Advanced Practice Providers (APPs -  Physician Assistants and Nurse Practitioners) who all work together to provide you with the care you need, when you need it.  We recommend signing up for the patient portal called "MyChart".  Sign up information is provided on this After Visit Summary.  MyChart is used to connect with patients for Virtual Visits (Telemedicine).  Patients are able to view lab/test results, encounter notes, upcoming appointments, etc.  Non-urgent messages can be sent to your provider as well.   To learn more about what you can do with MyChart, go to [email or web address].    Remote monitoring is used to monitor your Pacemaker or ICD from home. This monitoring reduces the number of office visits required to check your device to one time per year. It allows [place] to keep an eye on the functioning of your device to ensure it is working properly. You are scheduled for a device check from home on 05/09/22. You may send your transmission at any time that day. If you have a wireless device, the transmission will be sent  automatically. After your physician reviews your transmission, you will receive a postcard with your next transmission date.  Your next appointment:   6 month(s)  The format for your next appointment:   In Person  Provider:   You will see one of the following Advanced Practice Providers on your designated Care Team:   Tommye Standard, [place] Legrand Como "Jonni Sanger" Chalmers Cater, [place]   Thank you for choosing [facility]!!   Trinidad Curet, RN [phone]

## 2022-04-17 NOTE — Progress Notes (Signed)
Electrophysiology Office Note   Date:  04/17/2022   ID:  Ruben Murray, Ruben Murray 09-15-1945, MRN [id number]  PCP:  Ruben Macadam, MD  Cardiologist:  Ruben Murray Primary Electrophysiologist:  Jamaar Howes Ruben Leeds, MD    Chief Complaint: AF   History of Present Illness: Ruben Murray is a 77 y.o. male who is being seen today for the evaluation of AF at the request of Ruben Murray. Presenting today for electrophysiology evaluation.  Has a history significant for obesity, hypertension, sleep apnea, seizures.  January 2020 was treated for community-acquired pneumonia was found to be in atrial fibrillation.  He has had multiple failed cardioversions.  He is currently on warfarin.  He was admitted to the hospital 05/24/2021 with rapid difficult to control atrial fibrillation.  He was significantly diuresed.  He is now status post Abbott CRT-D implanted 99991111 for systolic heart failure.  Today, denies symptoms of palpitations, chest pain,  orthopnea, PND, lower extremity edema, claudication, dizziness, presyncope, syncope, bleeding, or neurologic sequela. The patient is tolerating medications without difficulties.  Since her device was implanted he has done well.  His baseline shortness of breath has remained the same.  Unfortunately his wife died in Mar 28, 2022.  In review of his device, he has had quite a few tachycardic episodes prior to that.  Since March 28, 2022, he has had controlled rates in the 70s to 90s.   Past Medical History:  Diagnosis Date   Acquired thrombophilia ([place]) 01/15/2019   Aortic atherosclerosis ([place])    Aortic root dilatation (HCC)    CAP (community acquired pneumonia) 03/06/2016   Chronic laryngitis [id number]   Chronic systolic CHF (congestive heart failure) ([place])    COPD GOLD II  08/02/2016   Quit smoking 2000  PFT's  07/10/2016  FEV1 1.98 (70 % ) ratio 67  p 19 % improvement from saba p nothing  prior to study while of coreg so rec as of 08/02/2016 try off coreg and on  bisoprolol      Essential hypertension 03/06/2016   Changed from coreg to bisoprolol due to copd with reversible component  08/02/2016 >>>    Hoarseness 08/31/2016   Referred to ent 08/31/2016 >>> seen 10/03/16 [name] dx gerd and rhinitis medicamentosa   > improved on f/u 01/31/17 on gerd rx/ flonase and off afrin   Hyperlipidemia    Hypertension    Laryngopharyngeal reflux (LPR) 10/03/2016   Mitral regurgitation    Moderate persistent asthma 08/02/2016   Morbid (severe) obesity due to excess calories ([place]) 08/02/2016   Nasal polyps 10/03/2016   NICM (nonischemic cardiomyopathy) ([place])    a. EF 40-45% in 04/2016, etiology not defined, managed medically.   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   Rhinitis medicamentosa 10/03/2016   Right thyroid nodule 03/06/2016   Seizures ([place])    "take RX daily" (03/06/2016)   Sensorineural hearing loss (SNHL) of both ears 01/31/2017   Tricuspid regurgitation    Past Surgical History:  Procedure Laterality Date   BIV ICD INSERTION CRT-D N/A 02/03/2022   Procedure: BIV ICD INSERTION CRT-D;  Surgeon: [name], MD;  Location: [place] [facility];  Service: Cardiovascular;  Laterality: N/A;   CARDIOVERSION N/A 12/13/2017   Procedure: CARDIOVERSION;  Surgeon: [name], MD;  Location: [place];  Service: Cardiovascular;  Laterality: N/A;   CARDIOVERSION N/A 09/25/2018   Procedure: CARDIOVERSION;  Surgeon: [name], MD;  Location: [place];  Service: Cardiovascular;  Laterality: N/A;   CARDIOVERSION N/A 11/04/2018   Procedure: CARDIOVERSION;  Surgeon: [name], MD;  Location: [facility] ENDOSCOPY;  Service: Cardiovascular;  Laterality: N/A;   CARDIOVERSION N/A 04/11/2019   Procedure: CARDIOVERSION;  Surgeon: [name], MD;  Location: [facility] ENDOSCOPY;  Service: Cardiovascular;  Laterality: N/A;   CIRCUMCISION     JOINT  REPLACEMENT     PERCUTANEOUS PINNING TOE FRACTURE Left    "big toe   REPLACEMENT TOTAL KNEE Left    RIGHT/LEFT HEART CATH AND CORONARY ANGIOGRAPHY N/A 11/06/2019   Procedure: RIGHT/LEFT HEART CATH AND CORONARY ANGIOGRAPHY;  Surgeon: [name], MD;  Location: [place] [facility];  Service: Cardiovascular;  Laterality: N/A;   TONSILLECTOMY AND ADENOIDECTOMY       Current Outpatient Medications  Medication Sig Dispense Refill   acetaminophen (TYLENOL) 500 MG tablet Take 1,000 mg by mouth at bedtime.     busPIRone (BUSPAR) 5 MG tablet Take 5 mg by mouth 2 (two) times daily.     Carboxymethylcellulose Sod PF 0.5 % SOLN Place 1 drop into both eyes daily as needed (dry eyes).     Cholecalciferol (VITAMIN D3) 25 MCG (1000 UT) CAPS Take 1,000 Units by mouth daily.     escitalopram (LEXAPRO) 20 MG tablet Take 20 mg by mouth at bedtime.     finasteride (PROSCAR) 5 MG tablet Take 1 tablet (5 mg total) by mouth daily at 12 noon. 90 tablet 1   fluticasone (FLONASE) 50 MCG/ACT nasal spray Place 1 spray into both nostrils 2 (two) times daily.     JARDIANCE 10 MG TABS tablet TAKE 1 TABLET BY MOUTH DAILY BEFORE BREAKFAST. 90 tablet 3   loratadine (CLARITIN) 10 MG tablet Take 10 mg by mouth every morning.     losartan (COZAAR) 25 MG tablet TAKE 1/2 TABLET BY MOUTH EVERY DAY 45 tablet 3   metoprolol succinate (TOPROL-XL) 100 MG 24 hr tablet Take 1 tablet (100 mg total) by mouth 2 (two) times daily. Take with or immediately following a meal. 180 tablet 3   Multiple Vitamin (MULTIVITAMIN WITH MINERALS) TABS tablet Take 1 tablet by mouth every morning.     PHENobarbital (LUMINAL) 97.2 MG tablet Take 97.2 mg by mouth every evening. Takes between 3:00-5:00pm.     potassium chloride SA (KLOR-CON M) 20 MEQ tablet Take 1 tablet (20 mEq total) by mouth daily. 90 tablet 3   pravastatin (PRAVACHOL) 40 MG tablet Take 40 mg by mouth at bedtime.      spironolactone (ALDACTONE) 25 MG tablet Take 0.5 tablets (12.5 mg  total) by mouth daily. 45 tablet 2   torsemide (DEMADEX) 20 MG tablet Take 3 tablets (60 mg total) by mouth daily. 90 tablet 2   triamcinolone cream (KENALOG) 0.1 % Apply 1 application  topically 2 (two) times daily as needed (rash).     warfarin (COUMADIN) 5 MG tablet Take 5 mg by mouth at bedtime.     No current facility-administered medications for this visit.    Allergies:   Dilaudid [hydromorphone hcl], Hydromorphone, Morphine sulfate, Tegretol [carbamazepine], and Carbamazepine   Social History:  The patient  reports that he quit smoking about 24 years ago. His smoking use included cigarettes. He has a 144.00 pack-year smoking history. He has never used smokeless tobacco. He reports that he does not drink alcohol and does not use drugs.   Family History:  The patient's family history  includes Hypertension in his mother; Other in his father.   ROS:  Please see the history of present illness.   Otherwise, review of systems is positive for none.   All other systems are reviewed and negative.   PHYSICAL EXAM: VS:  BP 128/60   Pulse 87   Ht 5' 8"$  (1.727 m)   Wt 242 lb 12.8 oz (110.1 kg)   SpO2 99%   BMI 36.92 kg/m  , BMI Body mass index is 36.92 kg/m. GEN: Well nourished, well developed, in no acute distress  HEENT: normal  Neck: no JVD, carotid bruits, or masses Cardiac: irregular; no murmurs, rubs, or gallops,no edema  Respiratory:  clear to auscultation bilaterally, normal work of breathing GI: soft, nontender, nondistended, + BS MS: no deformity or atrophy  Skin: warm and dry, device site well healed Neuro:  Strength and sensation are intact Psych: euthymic mood, full affect  EKG:  EKG is ordered today. Personal review of the ekg ordered shows AF, V paced  Personal review of the device interrogation today. Results in [place]: 04/19/2021: TSH 1.898 08/01/2021: Magnesium 2.4 01/27/2022: ALT 25; B Natriuretic Peptide 205.3; BUN 21; Creatinine, Ser 1.02;  Hemoglobin 15.2; Platelets 167; Potassium 3.7; Sodium 139    Lipid Panel     Component Value Date/Time   CHOL 110 07/30/2021 0256   TRIG 67 07/30/2021 0256   HDL 38 (L) 07/30/2021 0256   CHOLHDL 2.9 07/30/2021 0256   VLDL 13 07/30/2021 0256   LDLCALC 59 07/30/2021 0256     Wt Readings from Last 3 Encounters:  04/17/22 242 lb 12.8 oz (110.1 kg)  04/12/22 237 lb (107.5 kg)  04/05/22 237 lb (107.5 kg)      Other studies Reviewed: Additional studies/ records that were reviewed today include: TTE 04/20/21 Review of the above records today demonstrates:   1. Left ventricular ejection fraction, by estimation, is 20 to 25%. The  left ventricle has severely decreased function. The left ventricle  demonstrates global hypokinesis. The left ventricular internal cavity size  was moderately dilated. Left  ventricular diastolic parameters are indeterminate.   2. Right ventricular systolic function is mildly reduced. The right  ventricular size is mildly enlarged. There is moderately elevated  pulmonary artery systolic pressure. The estimated right ventricular  systolic pressure is 99991111 mmHg.   3. Left atrial size was moderately dilated.   4. Right atrial size was moderately dilated.   5. The mitral valve is normal in structure. Mild mitral valve  regurgitation. No evidence of mitral stenosis.   6. Tricuspid valve regurgitation is mild to moderate.   7. The aortic valve is tricuspid. Aortic valve regurgitation is mild. No  aortic stenosis is present.   8. Aortic dilatation noted. There is mild dilatation of the aortic root,  measuring 39 mm.   9. The inferior vena cava is dilated in size with <50% respiratory  variability, suggesting right atrial pressure of 15 mmHg.    ASSESSMENT AND PLAN:  1.  Permanent atrial fibrillation: Currently on digoxin 0.125 mg daily, metoprolol 100 mg twice daily, warfarin.  CHA2DS2-VASc of 4.  He remains in atrial fibrillation.  He feels short of breath,  likely a combination of atrial fibrillation and heart failure.  He is well rate controlled.  Is currently on amiodarone.  He is in permanent atrial fibrillation.  Bert Givans stop amiodarone today.  No changes.  2.  Obstructive sleep apnea: CPAP compliance encouraged  3.  Chronic systolic heart  failure: Currently on Aldactone and metoprolol.  Blood pressure is low and thus cannot tolerate alternative medications.  Status post Abbott CRT-D implanted 02/03/2022.  Device functioning appropriately.  No changes at this time.  4.  Secondary hypercoagulable state: Currently on warfarin for atrial fibrillation as above   Current medicines are reviewed at length with the patient today.   The patient does not have concerns regarding his medicines.  The following changes were made today: Stop amiodarone  Labs/ tests ordered today include:  Orders Placed This Encounter  Procedures   EKG 12-Lead     Disposition:   FU 6 months  Signed, Saleha Kalp Ruben Leeds, MD  04/17/2022 2:58 PM     [place] [address] [phone] (office) [phone] (fax)

## 2022-04-19 ENCOUNTER — Other Ambulatory Visit (HOSPITAL_COMMUNITY): Payer: Self-pay

## 2022-04-19 NOTE — Progress Notes (Signed)
Paramedicine Encounter    Patient ID: Ruben Murray, male    DOB: December 09, 1945, 77 y.o.   MRN: [id number]   Complaints- none   Assessment- A&OX4, warm and dry, seated in his hospital bed within his home, no shortness of breath seated or on exertion, no lower leg edema, 3lb weight gain from last week, lungs clear.   Compliance with meds- no missed doses   Pill box filled- one week   Refills needed- warfarin, buspar.  Meds changes since last visit- STOPPED AMIODARONE PER DR. Curt Bears     Social changes- none    BP 110/76   Pulse 72   Resp 16   Wt 240 lb (108.9 kg)   SpO2 93%   BMI 36.49 kg/m  Weight yesterday-240lbs Last visit weight-237lbs  Arrived for home visit for Mr. Ruben Murray who was seated alert and oriented reporting to be feeling well with no complaints. He denied shortness of breath but states he has been coughing in the mornings but feels this is related to his sinus drainage. Lungs clear. No lower leg swelling. No missed doses over the last week. Amiodarone was stopped by Dr. Curt Bears on 04/17/22. I reviewed meds and filled pill box for one week. Refills as noted will [facility] into [facility]. He was made aware and note left for reminder. Appointments reviewed and confirmed. HF education discussed and home visit complete.   [name], EMT-Paramedic [phone] 04/19/2022    [name], [place]  ACTION: Home visit completed    Patient Care Team: [name], MD as PCP - General (Family Medicine) [name], MD as PCP - Cardiology (Cardiology) [name], MD as PCP - Electrophysiology (Cardiology) [name], MD (Family Medicine) [place] as Case Manager (Hospice and Palliative Medicine)  Patient Active Problem List   Diagnosis Date Noted   CHF (congestive heart failure) ([place]) 02/03/2022   Atrial fibrillation ([place]) 11/02/2021   Long term (current) use of anticoagulants 11/02/2021   AKI (acute  kidney injury) ([place]) 07/27/2021   Acute pulmonary embolism ([place]) 07/26/2021   Acute on chronic systolic CHF (congestive heart failure) ([place]) 05/17/2021   Hypercoagulable state ([place]) 05/10/2021   Left leg pain 04/24/2021   OSA on CPAP 04/24/2021   Mitral regurgitation 04/24/2021   Tricuspid regurgitation 04/24/2021   Dilated aortic root ([place]) 04/24/2021   Permanent atrial fibrillation ([place]) 04/19/2021   Chronic arthropathy 04/13/2020   Callus 04/13/2020   Onychomycosis 12/12/2019   Left leg cellulitis 0000000   Chronic systolic heart failure ([place]) 12/09/2019   Dilated cardiomyopathy ([place])    Atrial flutter ([place]) 11/03/2019   Atrial flutter with rapid ventricular response ([place]) 11/03/2019   Acquired thrombophilia ([place]) 01/15/2019   Atrial fibrillation with rapid ventricular response ([place]) 10/29/2017   Chronic combined systolic and diastolic heart failure ([place]) 10/29/2017   Prolonged QT interval 10/29/2017   Sensorineural hearing loss (SNHL) of both ears 01/31/2017   Dysphonia 10/03/2016   Laryngopharyngeal reflux (LPR) 10/03/2016   Nasal polyps 10/03/2016   Rhinitis medicamentosa 10/03/2016   Chronic laryngitis 10/03/2016   Hoarseness 08/31/2016   Moderate persistent asthma 08/02/2016   COPD (chronic obstructive pulmonary disease) ([place]) 08/02/2016   Morbid (severe) obesity due to excess calories ([place]) 08/02/2016   Community acquired pneumonia 03/06/2016   Essential hypertension 03/06/2016   Mixed hyperlipidemia 03/06/2016   Right thyroid nodule 03/06/2016   Seizures ([place]) 03/06/2016   Fever 03/12/2015   Recurrent erosion of cornea 06/23/2011    Current Outpatient  Medications:    acetaminophen (TYLENOL) 500 MG tablet, Take 1,000 mg by mouth at bedtime., Disp: , Rfl:    busPIRone (BUSPAR) 5 MG tablet, Take 5 mg by mouth 2 (two) times daily., Disp: , Rfl:    Carboxymethylcellulose Sod PF 0.5 % SOLN, Place 1 drop into both eyes daily as needed (dry eyes)., Disp: , Rfl:     Cholecalciferol (VITAMIN D3) 25 MCG (1000 UT) CAPS, Take 1,000 Units by mouth daily., Disp: , Rfl:    escitalopram (LEXAPRO) 20 MG tablet, Take 20 mg by mouth at bedtime., Disp: , Rfl:    finasteride (PROSCAR) 5 MG tablet, Take 1 tablet (5 mg total) by mouth daily at 12 noon., Disp: 90 tablet, Rfl: 1   fluticasone (FLONASE) 50 MCG/ACT nasal spray, Place 1 spray into both nostrils 2 (two) times daily., Disp: , Rfl:    JARDIANCE 10 MG TABS tablet, TAKE 1 TABLET BY MOUTH DAILY BEFORE BREAKFAST., Disp: 90 tablet, Rfl: 3   loratadine (CLARITIN) 10 MG tablet, Take 10 mg by mouth every morning., Disp: , Rfl:    losartan (COZAAR) 25 MG tablet, TAKE 1/2 TABLET BY MOUTH EVERY DAY, Disp: 45 tablet, Rfl: 3   metoprolol succinate (TOPROL-XL) 100 MG 24 hr tablet, Take 1 tablet (100 mg total) by mouth 2 (two) times daily. Take with or immediately following a meal., Disp: 180 tablet, Rfl: 3   Multiple Vitamin (MULTIVITAMIN WITH MINERALS) TABS tablet, Take 1 tablet by mouth every morning., Disp: , Rfl:    PHENobarbital (LUMINAL) 97.2 MG tablet, Take 97.2 mg by mouth every evening. Takes between 3:00-5:00pm., Disp: , Rfl:    potassium chloride SA (KLOR-CON M) 20 MEQ tablet, Take 1 tablet (20 mEq total) by mouth daily., Disp: 90 tablet, Rfl: 3   pravastatin (PRAVACHOL) 40 MG tablet, Take 40 mg by mouth at bedtime. , Disp: , Rfl:    spironolactone (ALDACTONE) 25 MG tablet, Take 0.5 tablets (12.5 mg total) by mouth daily., Disp: 45 tablet, Rfl: 2   torsemide (DEMADEX) 20 MG tablet, Take 3 tablets (60 mg total) by mouth daily., Disp: 90 tablet, Rfl: 2   triamcinolone cream (KENALOG) 0.1 %, Apply 1 application  topically 2 (two) times daily as needed (rash)., Disp: , Rfl:    warfarin (COUMADIN) 5 MG tablet, Take 5 mg by mouth at bedtime., Disp: , Rfl:  Allergies  Allergen Reactions   Dilaudid [Hydromorphone Hcl] Other (See Comments)    Makes pt hyper    Hydromorphone Other (See Comments)    hyperactiviity Other  reaction(s): made him wild Other reaction(s): Unknown   Morphine Sulfate     Other reaction(s): at high dose causes confusion Other reaction(s): at high dose causes confusion Other reaction(s): at high dose causes confusion, Other (See Comments) Other reaction(s): at high dose causes confusion Other reaction(s): at high dose causes confusion    Tegretol [Carbamazepine] Hives   Carbamazepine Hives, Rash and Other (See Comments)    Other reaction(s): hives Other reaction(s): Hives Other reaction(s): hives     Social History   Socioeconomic History   Marital status: Married    Spouse name: Not on file   Number of children: Not on file   Years of education: Not on file   Highest education level: Not on file  Occupational History   Not on file  Tobacco Use   Smoking status: Former    Packs/day: 3.00    Years: 48.00    Total pack years: 144.00  Types: Cigarettes    Quit date: 2000    Years since quitting: 24.1   Smokeless tobacco: Never  Vaping Use   Vaping Use: Never used  Substance and Sexual Activity   Alcohol use: Never   Drug use: Never   Sexual activity: Never  Other Topics Concern   Not on file  Social History Narrative   ** Merged History Encounter **       Social Determinants of Health   Financial Resource Strain: Medium Risk (10/26/2021)   Overall Financial Resource Strain (CARDIA)    Difficulty of Paying Living Expenses: Somewhat hard  Food Insecurity: No Food Insecurity (02/04/2022)   Hunger Vital Sign    Worried About Running Out of Food in the Last Year: Never true    Ran Out of Food in the Last Year: Never true  Transportation Needs: Unmet Transportation Needs (02/04/2022)   PRAPARE - Hydrologist (Medical): Yes    Lack of Transportation (Non-Medical): No  Physical Activity: Inactive (12/15/2019)   Exercise Vital Sign    Days of Exercise per Week: 0 days    Minutes of Exercise per Session: 0 min  Stress: Stress  Concern Present (09/26/2018)   [place]    Feeling of Stress : To some extent  Social Connections: Not on file  Intimate Partner Violence: Not At Risk (02/04/2022)   Humiliation, Afraid, Rape, and Kick questionnaire    Fear of Current or Ex-Partner: No    Emotionally Abused: No    Physically Abused: No    Sexually Abused: No    Physical Exam      Future Appointments  Date Time Provider [place]  04/28/2022  2:00 PM [facility] None  05/09/2022  7:05 AM [facility]OT[facility]  05/17/2022  2:00 PM [name], MD [facility]  05/19/2022  2:30 PM [name], MD [facility]  08/08/2022  7:05 AM [facility] DEVICE [facility]OTES [facility]  11/07/2022  7:10 AM [facility]OT[facility]  02/06/2023  7:05 AM [facility]ICE [facility]OT[facility]  05/08/2023  7:05 AM [facility]  08/07/2023  7:05 AM [facility]  11/06/2023  7:05 AM [facility] DEVICE [facility]OTES [facility]  02/05/2024  7:05 AM [facility]ICE [facility]

## 2022-04-21 DIAGNOSIS — L989 Disorder of the skin and subcutaneous tissue, unspecified: Secondary | ICD-10-CM | POA: Diagnosis not present

## 2022-04-21 DIAGNOSIS — D219 Benign neoplasm of connective and other soft tissue, unspecified: Secondary | ICD-10-CM | POA: Diagnosis not present

## 2022-04-21 DIAGNOSIS — I5042 Chronic combined systolic (congestive) and diastolic (congestive) heart failure: Secondary | ICD-10-CM | POA: Diagnosis not present

## 2022-04-21 DIAGNOSIS — Z03818 Encounter for observation for suspected exposure to other biological agents ruled out: Secondary | ICD-10-CM | POA: Diagnosis not present

## 2022-04-21 DIAGNOSIS — L918 Other hypertrophic disorders of the skin: Secondary | ICD-10-CM | POA: Diagnosis not present

## 2022-04-21 DIAGNOSIS — R051 Acute cough: Secondary | ICD-10-CM | POA: Diagnosis not present

## 2022-04-26 ENCOUNTER — Other Ambulatory Visit (HOSPITAL_COMMUNITY): Payer: Self-pay

## 2022-04-26 NOTE — Progress Notes (Signed)
Paramedicine Encounter    Patient ID: Ruben Murray, male    DOB: 10-25-1945, 77 y.o.   MRN: [id number]   Complaints- cough with hoarseness in voice X6 days   Assessment- A&Ox4, warm and dry seated in his bed. Complaints of cough with hoarseness in his voice. No mucus production. No fever, chills or body aches. No chest pain, no increased shortness of breath, no swelling, no weight gain. Lungs clear with diminished bases.  Compliance with meds- no missed doses   Pill box filled- for one week   Refills needed- none   Meds changes since last visit- none     Social changes- none    BP 110/70   Pulse 76   Wt 232 lb (105.2 kg)   SpO2 95%   BMI 35.28 kg/m  Weight yesterday-233lbs Last visit weight-232lbs  Arrived for home visit for Ruben Murray who reports to be feeling well today with no complaints other than a cough with some hoarseness in his voice. He reports he went to PCP on Friday and was tested for covid and flu and both were negative. He was not given any medications and no changes were made by PCP at this visit. I reviewed meds and filled pill box for one week. Assessment and vitals as noted. Ruben Murray denied any chest pain, increased shortness of breath, dizziness, swelling or weight gain. He reports overall feeling well. We reviewed upcoming appointments and confirmed same. Home visit completed, I will see [name] in one week.    [name], [place]  ACTION: Home visit completed    Patient Care Team: [name], MD as PCP - General (Family Medicine) [name], MD as PCP - Cardiology (Cardiology) [name], MD as PCP - Electrophysiology (Cardiology) [name], MD (Family Medicine) [place] as Case Manager (Hospice and Palliative Medicine)  Patient Active Problem List   Diagnosis Date Noted   CHF (congestive heart failure) ([place]) 02/03/2022   Atrial fibrillation ([place]) 11/02/2021   Long term  (current) use of anticoagulants 11/02/2021   AKI (acute kidney injury) ([place]) 07/27/2021   Acute pulmonary embolism ([place]) 07/26/2021   Acute on chronic systolic CHF (congestive heart failure) ([place]) 05/17/2021   Hypercoagulable state ([place]) 05/10/2021   Left leg pain 04/24/2021   OSA on CPAP 04/24/2021   Mitral regurgitation 04/24/2021   Tricuspid regurgitation 04/24/2021   Dilated aortic root ([place]) 04/24/2021   Permanent atrial fibrillation ([place]) 04/19/2021   Chronic arthropathy 04/13/2020   Callus 04/13/2020   Onychomycosis 12/12/2019   Left leg cellulitis 0000000   Chronic systolic heart failure ([place]) 12/09/2019   Dilated cardiomyopathy ([place])    Atrial flutter ([place]) 11/03/2019   Atrial flutter with rapid ventricular response ([place]) 11/03/2019   Acquired thrombophilia ([place]) 01/15/2019   Atrial fibrillation with rapid ventricular response ([place]) 10/29/2017   Chronic combined systolic and diastolic heart failure ([place]) 10/29/2017   Prolonged QT interval 10/29/2017   Sensorineural hearing loss (SNHL) of both ears 01/31/2017   Dysphonia 10/03/2016   Laryngopharyngeal reflux (LPR) 10/03/2016   Nasal polyps 10/03/2016   Rhinitis medicamentosa 10/03/2016   Chronic laryngitis 10/03/2016   Hoarseness 08/31/2016   Moderate persistent asthma 08/02/2016   COPD (chronic obstructive pulmonary disease) ([place]) 08/02/2016   Morbid (severe) obesity due to excess calories ([place]) 08/02/2016   Community acquired pneumonia 03/06/2016   Essential hypertension 03/06/2016   Mixed hyperlipidemia 03/06/2016   Right thyroid nodule 03/06/2016   Seizures ([place]) 03/06/2016  Fever 03/12/2015   Recurrent erosion of cornea 06/23/2011    Current Outpatient Medications:    acetaminophen (TYLENOL) 500 MG tablet, Take 1,000 mg by mouth at bedtime., Disp: , Rfl:    busPIRone (BUSPAR) 5 MG tablet, Take 5 mg by mouth 2 (two) times daily., Disp: , Rfl:    Carboxymethylcellulose Sod PF 0.5 % SOLN, Place 1 drop into  both eyes daily as needed (dry eyes)., Disp: , Rfl:    Cholecalciferol (VITAMIN D3) 25 MCG (1000 UT) CAPS, Take 1,000 Units by mouth daily., Disp: , Rfl:    escitalopram (LEXAPRO) 20 MG tablet, Take 20 mg by mouth at bedtime., Disp: , Rfl:    finasteride (PROSCAR) 5 MG tablet, Take 1 tablet (5 mg total) by mouth daily at 12 noon., Disp: 90 tablet, Rfl: 1   fluticasone (FLONASE) 50 MCG/ACT nasal spray, Place 1 spray into both nostrils 2 (two) times daily., Disp: , Rfl:    JARDIANCE 10 MG TABS tablet, TAKE 1 TABLET BY MOUTH DAILY BEFORE BREAKFAST., Disp: 90 tablet, Rfl: 3   loratadine (CLARITIN) 10 MG tablet, Take 10 mg by mouth every morning., Disp: , Rfl:    losartan (COZAAR) 25 MG tablet, TAKE 1/2 TABLET BY MOUTH EVERY DAY, Disp: 45 tablet, Rfl: 3   metoprolol succinate (TOPROL-XL) 100 MG 24 hr tablet, Take 1 tablet (100 mg total) by mouth 2 (two) times daily. Take with or immediately following a meal., Disp: 180 tablet, Rfl: 3   Multiple Vitamin (MULTIVITAMIN WITH MINERALS) TABS tablet, Take 1 tablet by mouth every morning., Disp: , Rfl:    PHENobarbital (LUMINAL) 97.2 MG tablet, Take 97.2 mg by mouth every evening. Takes between 3:00-5:00pm., Disp: , Rfl:    potassium chloride SA (KLOR-CON M) 20 MEQ tablet, Take 1 tablet (20 mEq total) by mouth daily., Disp: 90 tablet, Rfl: 3   pravastatin (PRAVACHOL) 40 MG tablet, Take 40 mg by mouth at bedtime. , Disp: , Rfl:    spironolactone (ALDACTONE) 25 MG tablet, Take 0.5 tablets (12.5 mg total) by mouth daily., Disp: 45 tablet, Rfl: 2   torsemide (DEMADEX) 20 MG tablet, Take 3 tablets (60 mg total) by mouth daily., Disp: 90 tablet, Rfl: 2   triamcinolone cream (KENALOG) 0.1 %, Apply 1 application  topically 2 (two) times daily as needed (rash)., Disp: , Rfl:    warfarin (COUMADIN) 5 MG tablet, Take 5 mg by mouth at bedtime., Disp: , Rfl:  Allergies  Allergen Reactions   Dilaudid [Hydromorphone Hcl] Other (See Comments)    Makes pt hyper     Hydromorphone Other (See Comments)    hyperactiviity Other reaction(s): made him wild Other reaction(s): Unknown   Morphine Sulfate     Other reaction(s): at high dose causes confusion Other reaction(s): at high dose causes confusion Other reaction(s): at high dose causes confusion, Other (See Comments) Other reaction(s): at high dose causes confusion Other reaction(s): at high dose causes confusion    Tegretol [Carbamazepine] Hives   Carbamazepine Hives, Rash and Other (See Comments)    Other reaction(s): hives Other reaction(s): Hives Other reaction(s): hives     Social History   Socioeconomic History   Marital status: Married    Spouse name: Not on file   Number of children: Not on file   Years of education: Not on file   Highest education level: Not on file  Occupational History   Not on file  Tobacco Use   Smoking status: Former    Packs/day: 3.00  Years: 48.00    Total pack years: 144.00    Types: Cigarettes    Quit date: 2000    Years since quitting: 24.1   Smokeless tobacco: Never  Vaping Use   Vaping Use: Never used  Substance and Sexual Activity   Alcohol use: Never   Drug use: Never   Sexual activity: Never  Other Topics Concern   Not on file  Social History Narrative   ** Merged History Encounter **       Social Determinants of Health   Financial Resource Strain: Medium Risk (10/26/2021)   Overall Financial Resource Strain (CARDIA)    Difficulty of Paying Living Expenses: Somewhat hard  Food Insecurity: No Food Insecurity (02/04/2022)   Hunger Vital Sign    Worried About Running Out of Food in the Last Year: Never true    Ran Out of Food in the Last Year: Never true  Transportation Needs: Unmet Transportation Needs (02/04/2022)   PRAPARE - Hydrologist (Medical): Yes    Lack of Transportation (Non-Medical): No  Physical Activity: Inactive (12/15/2019)   Exercise Vital Sign    Days of Exercise per Week: 0 days     Minutes of Exercise per Session: 0 min  Stress: Stress Concern Present (09/26/2018)   [place]    Feeling of Stress : To some extent  Social Connections: Not on file  Intimate Partner Violence: Not At Risk (02/04/2022)   Humiliation, Afraid, Rape, and Kick questionnaire    Fear of Current or Ex-Partner: No    Emotionally Abused: No    Physically Abused: No    Sexually Abused: No    Physical Exam      Future Appointments  Date Time Provider [place]  04/28/2022  2:00 PM [facility] None  05/09/2022  7:05 AM [facility]  05/17/2022  2:00 PM [name], MD [facility]  05/19/2022  2:30 PM [name], MD [facility]  08/08/2022  7:05 AM [facility]  11/07/2022  7:10 AM [facility]  02/06/2023  7:05 AM [facility]  05/08/2023  7:05 AM [facility]  08/07/2023  7:05 AM [facility]  11/06/2023  7:05 AM [facility]  02/05/2024  7:05 AM [facility]

## 2022-04-28 ENCOUNTER — Ambulatory Visit: Payer: No Typology Code available for payment source | Attending: Cardiology

## 2022-04-28 DIAGNOSIS — Z7901 Long term (current) use of anticoagulants: Secondary | ICD-10-CM | POA: Diagnosis not present

## 2022-04-28 DIAGNOSIS — I4891 Unspecified atrial fibrillation: Secondary | ICD-10-CM

## 2022-04-28 LAB — POCT INR: INR: 5.4 — AB (ref 2.0–3.0)

## 2022-04-28 NOTE — Patient Instructions (Signed)
Description   Hold Warfarin today and tomorrow and then continue taking warfarin 1 tablet ('5mg'$ ) daily.  Stay consistent with greens (1-2 times per week).   Recheck INR in 1 week.   [facility] [phone]

## 2022-05-03 ENCOUNTER — Other Ambulatory Visit (HOSPITAL_COMMUNITY): Payer: Self-pay

## 2022-05-03 NOTE — Progress Notes (Signed)
Paramedicine Encounter    Patient ID: Ruben Murray, male    DOB: Jul 31, 1945, 77 y.o.   MRN: [id number]   Complaints- none   Assessment- A&Ox4, warm and dry, seated on the edge of his bed, no complaints. No shortness of breath, no dizziness, no chest pain, he reports having 3 moles removed on Friday with his dermatologist and having no complications from same. No swelling, no weight gain.   Compliance with meds- didn't hold his two nights of warfarin as instructed from coumadin clinic last week- he will hold tonight and tomorrow night.   Pill box filled- for one week.   Refills needed- Tylenol OTC    Meds changes since last visit- NONE     Social changes- NONE    BP 98/62   Pulse 83   Resp 16   Wt 230 lb (104.3 kg)   SpO2 97%   BMI 34.97 kg/m  Weight yesterday- 231lbs  Last visit weight-- 232lbs  Arrived for home visit for [name] who reports to be feeling fine with no complaints today. He says he has had no shortness of breath, dizziness, chest pain, weight gain or swelling. His weight is down 2 lbs this week. Lungs clear. He had 3 moles removed on Friday from Dermatology with no complaints. He was compliant with meds however he did not hold his warfarin for the two days so we will hold warfarin tonight and tomorrow night. I filled pil box for one week. Vitals and assessment reviewed. Appointments reviewed and confirmed. I will see [facility] in one week. He confirmed same. Home visit complete.     [name], [place]  ACTION: Home visit completed    Patient Care Team: [name], MD as PCP - General (Family Medicine) [name], MD as PCP - Cardiology (Cardiology) [name], MD as PCP - Electrophysiology (Cardiology) [name], MD (Family Medicine) [place] as Case Manager (Hospice and Palliative Medicine)  Patient Active Problem List   Diagnosis Date Noted   CHF (congestive heart failure) ([place])  02/03/2022   Atrial fibrillation ([place]) 11/02/2021   Long term (current) use of anticoagulants 11/02/2021   AKI (acute kidney injury) ([place]) 07/27/2021   Acute pulmonary embolism ([place]) 07/26/2021   Acute on chronic systolic CHF (congestive heart failure) ([place]) 05/17/2021   Hypercoagulable state ([place]) 05/10/2021   Left leg pain 04/24/2021   OSA on CPAP 04/24/2021   Mitral regurgitation 04/24/2021   Tricuspid regurgitation 04/24/2021   Dilated aortic root ([place]) 04/24/2021   Permanent atrial fibrillation ([place]) 04/19/2021   Chronic arthropathy 04/13/2020   Callus 04/13/2020   Onychomycosis 12/12/2019   Left leg cellulitis 0000000   Chronic systolic heart failure ([place]) 12/09/2019   Dilated cardiomyopathy ([place])    Atrial flutter ([place]) 11/03/2019   Atrial flutter with rapid ventricular response ([place]) 11/03/2019   Acquired thrombophilia ([place]) 01/15/2019   Atrial fibrillation with rapid ventricular response ([place]) 10/29/2017   Chronic combined systolic and diastolic heart failure ([place]) 10/29/2017   Prolonged QT interval 10/29/2017   Sensorineural hearing loss (SNHL) of both ears 01/31/2017   Dysphonia 10/03/2016   Laryngopharyngeal reflux (LPR) 10/03/2016   Nasal polyps 10/03/2016   Rhinitis medicamentosa 10/03/2016   Chronic laryngitis 10/03/2016   Hoarseness 08/31/2016   Moderate persistent asthma 08/02/2016   COPD (chronic obstructive pulmonary disease) ([place]) 08/02/2016   Morbid (severe) obesity due to excess calories ([place]) 08/02/2016   Community acquired pneumonia 03/06/2016   Essential hypertension 03/06/2016  Mixed hyperlipidemia 03/06/2016   Right thyroid nodule 03/06/2016   Seizures ([place]) 03/06/2016   Fever 03/12/2015   Recurrent erosion of cornea 06/23/2011    Current Outpatient Medications:    acetaminophen (TYLENOL) 500 MG tablet, Take 1,000 mg by mouth at bedtime., Disp: , Rfl:    busPIRone (BUSPAR) 5 MG tablet, Take 5 mg by mouth 2 (two) times daily., Disp: , Rfl:     Carboxymethylcellulose Sod PF 0.5 % SOLN, Place 1 drop into both eyes daily as needed (dry eyes)., Disp: , Rfl:    Cholecalciferol (VITAMIN D3) 25 MCG (1000 UT) CAPS, Take 1,000 Units by mouth daily., Disp: , Rfl:    escitalopram (LEXAPRO) 20 MG tablet, Take 20 mg by mouth at bedtime., Disp: , Rfl:    finasteride (PROSCAR) 5 MG tablet, Take 1 tablet (5 mg total) by mouth daily at 12 noon., Disp: 90 tablet, Rfl: 1   fluticasone (FLONASE) 50 MCG/ACT nasal spray, Place 1 spray into both nostrils 2 (two) times daily., Disp: , Rfl:    JARDIANCE 10 MG TABS tablet, TAKE 1 TABLET BY MOUTH DAILY BEFORE BREAKFAST., Disp: 90 tablet, Rfl: 3   loratadine (CLARITIN) 10 MG tablet, Take 10 mg by mouth every morning., Disp: , Rfl:    losartan (COZAAR) 25 MG tablet, TAKE 1/2 TABLET BY MOUTH EVERY DAY, Disp: 45 tablet, Rfl: 3   metoprolol succinate (TOPROL-XL) 100 MG 24 hr tablet, Take 1 tablet (100 mg total) by mouth 2 (two) times daily. Take with or immediately following a meal., Disp: 180 tablet, Rfl: 3   Multiple Vitamin (MULTIVITAMIN WITH MINERALS) TABS tablet, Take 1 tablet by mouth every morning., Disp: , Rfl:    PHENobarbital (LUMINAL) 97.2 MG tablet, Take 97.2 mg by mouth every evening. Takes between 3:00-5:00pm., Disp: , Rfl:    potassium chloride SA (KLOR-CON M) 20 MEQ tablet, Take 1 tablet (20 mEq total) by mouth daily., Disp: 90 tablet, Rfl: 3   pravastatin (PRAVACHOL) 40 MG tablet, Take 40 mg by mouth at bedtime. , Disp: , Rfl:    spironolactone (ALDACTONE) 25 MG tablet, Take 0.5 tablets (12.5 mg total) by mouth daily., Disp: 45 tablet, Rfl: 2   torsemide (DEMADEX) 20 MG tablet, Take 3 tablets (60 mg total) by mouth daily., Disp: 90 tablet, Rfl: 2   triamcinolone cream (KENALOG) 0.1 %, Apply 1 application  topically 2 (two) times daily as needed (rash)., Disp: , Rfl:    warfarin (COUMADIN) 5 MG tablet, Take 5 mg by mouth at bedtime., Disp: , Rfl:  Allergies  Allergen Reactions   Dilaudid  [Hydromorphone Hcl] Other (See Comments)    Makes pt hyper    Hydromorphone Other (See Comments)    hyperactiviity Other reaction(s): made him wild Other reaction(s): Unknown   Morphine Sulfate     Other reaction(s): at high dose causes confusion Other reaction(s): at high dose causes confusion Other reaction(s): at high dose causes confusion, Other (See Comments) Other reaction(s): at high dose causes confusion Other reaction(s): at high dose causes confusion    Tegretol [Carbamazepine] Hives   Carbamazepine Hives, Rash and Other (See Comments)    Other reaction(s): hives Other reaction(s): Hives Other reaction(s): hives     Social History   Socioeconomic History   Marital status: Married    Spouse name: Not on file   Number of children: Not on file   Years of education: Not on file   Highest education level: Not on file  Occupational History   Not  on file  Tobacco Use   Smoking status: Former    Packs/day: 3.00    Years: 48.00    Total pack years: 144.00    Types: Cigarettes    Quit date: 2000    Years since quitting: 24.1   Smokeless tobacco: Never  Vaping Use   Vaping Use: Never used  Substance and Sexual Activity   Alcohol use: Never   Drug use: Never   Sexual activity: Never  Other Topics Concern   Not on file  Social History Narrative   ** Merged History Encounter **       Social Determinants of Health   Financial Resource Strain: Medium Risk (10/26/2021)   Overall Financial Resource Strain (CARDIA)    Difficulty of Paying Living Expenses: Somewhat hard  Food Insecurity: No Food Insecurity (02/04/2022)   Hunger Vital Sign    Worried About Running Out of Food in the Last Year: Never true    Ran Out of Food in the Last Year: Never true  Transportation Needs: Unmet Transportation Needs (02/04/2022)   PRAPARE - Hydrologist (Medical): Yes    Lack of Transportation (Non-Medical): No  Physical Activity: Inactive (12/15/2019)    Exercise Vital Sign    Days of Exercise per Week: 0 days    Minutes of Exercise per Session: 0 min  Stress: Stress Concern Present (09/26/2018)   [place]    Feeling of Stress : To some extent  Social Connections: Not on file  Intimate Partner Violence: Not At Risk (02/04/2022)   Humiliation, Afraid, Rape, and Kick questionnaire    Fear of Current or Ex-Partner: No    Emotionally Abused: No    Physically Abused: No    Sexually Abused: No    Physical Exam      Future Appointments  Date Time Provider [place]  05/05/2022 11:00 AM [facility] None  05/09/2022  7:05 AM [facility] DEVICE REMOTES [facility]  05/17/2022  2:00 PM [name], MD [facility]  08/08/2022  7:05 AM [facility] DEVICE REMOTES [facility]  11/07/2022  7:10 AM [facility] DEVICE REMOTES [facility]  02/06/2023  7:05 AM [facility] DEVICE REMOTES [facility]  05/08/2023  7:05 AM [facility] DEVICE REMOTES [facility]  08/07/2023  7:05 AM [facility] DEVICE REMOTES [facility]  11/06/2023  7:05 AM [facility] DEVICE REMOTES [facility]  02/05/2024  7:05 AM [facility] DEVICE REMOTES [facility]

## 2022-05-05 ENCOUNTER — Ambulatory Visit: Payer: No Typology Code available for payment source | Attending: Cardiology | Admitting: *Deleted

## 2022-05-05 DIAGNOSIS — I4891 Unspecified atrial fibrillation: Secondary | ICD-10-CM

## 2022-05-05 DIAGNOSIS — Z7901 Long term (current) use of anticoagulants: Secondary | ICD-10-CM | POA: Diagnosis not present

## 2022-05-05 LAB — POCT INR: POC INR: 4.6

## 2022-05-05 NOTE — Patient Instructions (Addendum)
Description   Hold warfarin today and tomorrow Then START taking warfarin 1 tablet daily except 1/2 a tablet on Mondays and Fridays. Recheck INR in 1.5 weeks.  Coumadin Clinic [phone]

## 2022-05-09 ENCOUNTER — Other Ambulatory Visit (HOSPITAL_COMMUNITY): Payer: Self-pay | Admitting: Internal Medicine

## 2022-05-10 ENCOUNTER — Telehealth (HOSPITAL_COMMUNITY): Payer: Self-pay

## 2022-05-10 ENCOUNTER — Other Ambulatory Visit (HOSPITAL_COMMUNITY): Payer: Self-pay

## 2022-05-10 DIAGNOSIS — M109 Gout, unspecified: Secondary | ICD-10-CM | POA: Diagnosis not present

## 2022-05-10 DIAGNOSIS — F419 Anxiety disorder, unspecified: Secondary | ICD-10-CM | POA: Diagnosis not present

## 2022-05-10 DIAGNOSIS — I10 Essential (primary) hypertension: Secondary | ICD-10-CM | POA: Diagnosis not present

## 2022-05-10 NOTE — Telephone Encounter (Signed)
Received call back from [facility]- Dr. Thornton Park RN who reports their office has no openings for Ruben Murray to be seen so they would like him to be seen at an urgent care to be further evaluated. I called Ruben Murray to inform him and he agreed with this and will have his friend Ruben Murray take him. I will follow up via phone tomorrow. Call complete.   [name], [place] 05/10/2022

## 2022-05-10 NOTE — Progress Notes (Signed)
Paramedicine Encounter    Patient ID: Ruben Murray, male    DOB: 12-Nov-1945, 77 y.o.   MRN: [id number]   Complaints- Right hand joint swelling and pain. Short of breath on exertion. (Pt reports no more than his normal baseline shortness of breath)   Assessment- CAOX4, warm and dry seated semi fowlers in his bed reporting to be having some pain in his right hand. Hand appears swollen and red in the joints. He says this feels similar to the gout pain he had in his foot in the past. No lower leg edema. Lungs clear. Some shortness of breath on exertion. No more than his baseline. Weight up 9lbs from last week- one pound over night. Weight has been increasing daily- he admits to eating BBQ, bacon egg and cheese biscuits every day, ice cream and snack foods.   Compliance with meds-one night dose missed.   Pill box filled- for one week.   Refills needed- metoprolol   Meds changes since last visit-warfarin change- 1/2 tab on M&F's, 1 tab all other days.     Social changes- none    BP 120/86   Pulse 96   Resp 16   Wt 239 lb (108.4 kg)   SpO2 98%   BMI 36.34 kg/m  Weight yesterday-238lbs Last visit weight-230lbs  Arrived for home visit for Ruben Murray reports to be having some right hand swelling and joint pain with some redness around the joints. He reports this feels similar to his episode of gout he had in his foot. He winces in pain upon trying to move this hand. I reached out to RN with his PCP and left a message for her informing them of same. He took '1000mg'$  Tylenol and I advised him to seek further care if he doesn't hear back from me and the pain is severe. He agreed with plan. He admits he has been eating BBQ, bacon egg and cheese biscuits every day, ice cream and snacks. His weights been increasing daily. I advised him the importance of adhering to a hearth healthy diet. He verbalized understanding. Vitals and assessment obtained. No edema noted in the lower legs. Lungs clear. We  reviewed medications and I filled pill box for one week. He had not been taking his Warfarin correctly since his coumadin visit on 3/1. He had held it for two nights and then took 1.5 tablets rather than 1/2 on Mondays and Fridays. I informed him of the correct dosing and made sure pill box reflected same. He agreed. Appointments reviewed and confirmed. Home visit complete. I will follow up on the phone tomorrow to check in on his hand and then in the home in one week for our scheduled visit.    [name], [place]  ACTION: Home visit completed    Patient Care Team: [name], MD as PCP - General (Family Medicine) [name], MD as PCP - Cardiology (Cardiology) [name], MD as PCP - Electrophysiology (Cardiology) [name], MD (Family Medicine) [place] as Case Manager [facility] and Palliative Medicine)  Patient Active Problem List   Diagnosis Date Noted   CHF (congestive heart failure) ([place]) 02/03/2022   Atrial fibrillation ([place]) 11/02/2021   Long term (current) use of anticoagulants 11/02/2021   AKI (acute kidney injury) ([place]) 07/27/2021   Acute pulmonary embolism ([place]) 07/26/2021   Acute on chronic systolic CHF (congestive heart failure) ([place]) 05/17/2021   Hypercoagulable state ([place]) 05/10/2021   Left leg pain 04/24/2021  OSA on CPAP 04/24/2021   Mitral regurgitation 04/24/2021   Tricuspid regurgitation 04/24/2021   Dilated aortic root ([place]) 04/24/2021   Permanent atrial fibrillation ([place]) 04/19/2021   Chronic arthropathy 04/13/2020   Callus 04/13/2020   Onychomycosis 12/12/2019   Left leg cellulitis 0000000   Chronic systolic heart failure ([place]) 12/09/2019   Dilated cardiomyopathy ([place])    Atrial flutter ([place]) 11/03/2019   Atrial flutter with rapid ventricular response ([place]) 11/03/2019   Acquired thrombophilia ([place]) 01/15/2019   Atrial fibrillation with rapid ventricular response ([place]) 10/29/2017    Chronic combined systolic and diastolic heart failure ([place]) 10/29/2017   Prolonged QT interval 10/29/2017   Sensorineural hearing loss (SNHL) of both ears 01/31/2017   Dysphonia 10/03/2016   Laryngopharyngeal reflux (LPR) 10/03/2016   Nasal polyps 10/03/2016   Rhinitis medicamentosa 10/03/2016   Chronic laryngitis 10/03/2016   Hoarseness 08/31/2016   Moderate persistent asthma 08/02/2016   COPD (chronic obstructive pulmonary disease) ([place]) 08/02/2016   Morbid (severe) obesity due to excess calories ([place]) 08/02/2016   Community acquired pneumonia 03/06/2016   Essential hypertension 03/06/2016   Mixed hyperlipidemia 03/06/2016   Right thyroid nodule 03/06/2016   Seizures ([place]) 03/06/2016   Fever 03/12/2015   Recurrent erosion of cornea 06/23/2011    Current Outpatient Medications:    acetaminophen (TYLENOL) 500 MG tablet, Take 1,000 mg by mouth at bedtime., Disp: , Rfl:    busPIRone (BUSPAR) 5 MG tablet, Take 5 mg by mouth 2 (two) times daily., Disp: , Rfl:    Carboxymethylcellulose Sod PF 0.5 % SOLN, Place 1 drop into both eyes daily as needed (dry eyes)., Disp: , Rfl:    Cholecalciferol (VITAMIN D3) 25 MCG (1000 UT) CAPS, Take 1,000 Units by mouth daily., Disp: , Rfl:    escitalopram (LEXAPRO) 20 MG tablet, Take 20 mg by mouth at bedtime., Disp: , Rfl:    finasteride (PROSCAR) 5 MG tablet, TAKE 1 TABLET (5 MG TOTAL) BY MOUTH DAILY AT 12 NOON., Disp: 90 tablet, Rfl: 1   fluticasone (FLONASE) 50 MCG/ACT nasal spray, Place 1 spray into both nostrils 2 (two) times daily., Disp: , Rfl:    JARDIANCE 10 MG TABS tablet, TAKE 1 TABLET BY MOUTH DAILY BEFORE BREAKFAST., Disp: 90 tablet, Rfl: 3   loratadine (CLARITIN) 10 MG tablet, Take 10 mg by mouth every morning., Disp: , Rfl:    losartan (COZAAR) 25 MG tablet, TAKE 1/2 TABLET BY MOUTH EVERY DAY, Disp: 45 tablet, Rfl: 3   metoprolol succinate (TOPROL-XL) 100 MG 24 hr tablet, Take 1 tablet (100 mg total) by mouth 2 (two) times daily. Take with  or immediately following a meal., Disp: 180 tablet, Rfl: 3   Multiple Vitamin (MULTIVITAMIN WITH MINERALS) TABS tablet, Take 1 tablet by mouth every morning., Disp: , Rfl:    PHENobarbital (LUMINAL) 97.2 MG tablet, Take 97.2 mg by mouth every evening. Takes between 3:00-5:00pm., Disp: , Rfl:    potassium chloride SA (KLOR-CON M) 20 MEQ tablet, Take 1 tablet (20 mEq total) by mouth daily., Disp: 90 tablet, Rfl: 3   pravastatin (PRAVACHOL) 40 MG tablet, Take 40 mg by mouth at bedtime. , Disp: , Rfl:    spironolactone (ALDACTONE) 25 MG tablet, Take 0.5 tablets (12.5 mg total) by mouth daily., Disp: 45 tablet, Rfl: 2   torsemide (DEMADEX) 20 MG tablet, Take 3 tablets (60 mg total) by mouth daily., Disp: 90 tablet, Rfl: 2   triamcinolone cream (KENALOG) 0.1 %, Apply 1 application  topically 2 (two) times daily  as needed (rash)., Disp: , Rfl:    warfarin (COUMADIN) 5 MG tablet, Take 5 mg by mouth at bedtime., Disp: , Rfl:  Allergies  Allergen Reactions   Dilaudid [Hydromorphone Hcl] Other (See Comments)    Makes pt hyper    Hydromorphone Other (See Comments)    hyperactiviity Other reaction(s): made him wild Other reaction(s): Unknown   Morphine Sulfate     Other reaction(s): at high dose causes confusion Other reaction(s): at high dose causes confusion Other reaction(s): at high dose causes confusion, Other (See Comments) Other reaction(s): at high dose causes confusion Other reaction(s): at high dose causes confusion    Tegretol [Carbamazepine] Hives   Carbamazepine Hives, Rash and Other (See Comments)    Other reaction(s): hives Other reaction(s): Hives Other reaction(s): hives     Social History   Socioeconomic History   Marital status: Married    Spouse name: Not on file   Number of children: Not on file   Years of education: Not on file   Highest education level: Not on file  Occupational History   Not on file  Tobacco Use   Smoking status: Former    Packs/day: 3.00     Years: 48.00    Total pack years: 144.00    Types: Cigarettes    Quit date: 2000    Years since quitting: 24.1   Smokeless tobacco: Never  Vaping Use   Vaping Use: Never used  Substance and Sexual Activity   Alcohol use: Never   Drug use: Never   Sexual activity: Never  Other Topics Concern   Not on file  Social History Narrative   ** Merged History Encounter **       Social Determinants of Health   Financial Resource Strain: Medium Risk (10/26/2021)   Overall Financial Resource Strain (CARDIA)    Difficulty of Paying Living Expenses: Somewhat hard  Food Insecurity: No Food Insecurity (02/04/2022)   Hunger Vital Sign    Worried About Running Out of Food in the Last Year: Never true    Ran Out of Food in the Last Year: Never true  Transportation Needs: Unmet Transportation Needs (02/04/2022)   PRAPARE - Hydrologist (Medical): Yes    Lack of Transportation (Non-Medical): No  Physical Activity: Inactive (12/15/2019)   Exercise Vital Sign    Days of Exercise per Week: 0 days    Minutes of Exercise per Session: 0 min  Stress: Stress Concern Present (09/26/2018)   [place]    Feeling of Stress : To some extent  Social Connections: Not on file  Intimate Partner Violence: Not At Risk (02/04/2022)   Humiliation, Afraid, Rape, and Kick questionnaire    Fear of Current or Ex-Partner: No    Emotionally Abused: No    Physically Abused: No    Sexually Abused: No    Physical Exam      Future Appointments  Date Time Provider [place]  05/16/2022  1:30 PM [facility] None  05/17/2022  2:00 PM [name], MD [facility]  06/21/2022  2:00 PM [place] ECHO OP 1 [facility]  06/21/2022  3:00 PM Bensimhon, Shaune Pascal, MD [facility] None  08/08/2022  7:05 AM [facility] DEVICE REMOTES [facility]  11/07/2022  7:10 AM [facility] DEVICE  REMOTES [facility]  02/06/2023  7:05 AM [facility] DEVICE REMOTES [facility]  05/08/2023  7:05 AM [facility] DEVICE REMOTES [facility]  08/07/2023  7:05 AM [facility] DEVICE REMOTES [facility]  11/06/2023  7:05 AM [facility] DEVICE REMOTES [facility]  02/05/2024  7:05 AM [facility] DEVICE REMOTES [facility]

## 2022-05-11 ENCOUNTER — Emergency Department (HOSPITAL_COMMUNITY)
Admission: EM | Admit: 2022-05-11 | Discharge: 2022-05-11 | Disposition: A | Discharge status: Home / Self Care | Payer: No Typology Code available for payment source | Attending: Emergency Medicine | Admitting: Emergency Medicine

## 2022-05-11 ENCOUNTER — Other Ambulatory Visit: Payer: Self-pay

## 2022-05-11 ENCOUNTER — Encounter (HOSPITAL_COMMUNITY): Payer: Self-pay

## 2022-05-11 ENCOUNTER — Emergency Department (HOSPITAL_COMMUNITY): Payer: No Typology Code available for payment source

## 2022-05-11 DIAGNOSIS — W19XXXA Unspecified fall, initial encounter: Secondary | ICD-10-CM | POA: Diagnosis not present

## 2022-05-11 DIAGNOSIS — S0990XA Unspecified injury of head, initial encounter: Secondary | ICD-10-CM | POA: Diagnosis not present

## 2022-05-11 DIAGNOSIS — G4489 Other headache syndrome: Secondary | ICD-10-CM | POA: Diagnosis not present

## 2022-05-11 DIAGNOSIS — T1490XA Injury, unspecified, initial encounter: Secondary | ICD-10-CM | POA: Diagnosis not present

## 2022-05-11 DIAGNOSIS — W0110XA Fall on same level from slipping, tripping and stumbling with subsequent striking against unspecified object, initial encounter: Secondary | ICD-10-CM | POA: Insufficient documentation

## 2022-05-11 DIAGNOSIS — R531 Weakness: Secondary | ICD-10-CM | POA: Diagnosis not present

## 2022-05-11 DIAGNOSIS — Z7901 Long term (current) use of anticoagulants: Secondary | ICD-10-CM | POA: Diagnosis not present

## 2022-05-11 DIAGNOSIS — M79644 Pain in right finger(s): Secondary | ICD-10-CM | POA: Diagnosis not present

## 2022-05-11 LAB — COMPREHENSIVE METABOLIC PANEL
ALT: 22 U/L (ref 0–44)
AST: 21 U/L (ref 15–41)
Albumin: 3.4 g/dL — ABNORMAL LOW (ref 3.5–5.0)
Alkaline Phosphatase: 106 U/L (ref 38–126)
Anion gap: 8 (ref 5–15)
BUN: 26 mg/dL — ABNORMAL HIGH (ref 8–23)
CO2: 32 mmol/L (ref 22–32)
Calcium: 8.5 mg/dL — ABNORMAL LOW (ref 8.9–10.3)
Chloride: 99 mmol/L (ref 98–111)
Creatinine, Ser: 1.45 mg/dL — ABNORMAL HIGH (ref 0.61–1.24)
GFR, Estimated: 49 mL/min — ABNORMAL LOW (ref >60)
Glucose, Bld: 100 mg/dL — ABNORMAL HIGH (ref 70–99)
Potassium: 4 mmol/L (ref 3.5–5.1)
Sodium: 139 mmol/L (ref 135–145)
Total Bilirubin: 1 mg/dL (ref 0.3–1.2)
Total Protein: 6.9 g/dL (ref 6.5–8.1)

## 2022-05-11 LAB — CBC
HCT: 43.2 % (ref 39.0–52.0)
Hemoglobin: 14.5 g/dL (ref 13.0–17.0)
MCH: 33 pg (ref 26.0–34.0)
MCHC: 33.6 g/dL (ref 30.0–36.0)
MCV: 98.2 fL (ref 80.0–100.0)
Platelets: 167 10*3/uL (ref 150–400)
RBC: 4.4 MIL/uL (ref 4.22–5.81)
RDW: 14.6 % (ref 11.5–15.5)
WBC: 8.8 10*3/uL (ref 4.0–10.5)
nRBC: 0 % (ref 0.0–0.2)

## 2022-05-11 LAB — I-STAT CHEM 8, ED
BUN: 29 mg/dL — ABNORMAL HIGH (ref 8–23)
Calcium, Ion: 0.94 mmol/L — ABNORMAL LOW (ref 1.15–1.40)
Chloride: 100 mmol/L (ref 98–111)
Creatinine, Ser: 1.5 mg/dL — ABNORMAL HIGH (ref 0.61–1.24)
Glucose, Bld: 100 mg/dL — ABNORMAL HIGH (ref 70–99)
HCT: 46 % (ref 39.0–52.0)
Hemoglobin: 15.6 g/dL (ref 13.0–17.0)
Potassium: 4.7 mmol/L (ref 3.5–5.1)
Sodium: 137 mmol/L (ref 135–145)
TCO2: 30 mmol/L (ref 22–32)

## 2022-05-11 LAB — LACTIC ACID, PLASMA: Lactic Acid, Venous: 1.1 mmol/L (ref 0.5–1.9)

## 2022-05-11 LAB — PROTIME-INR
INR: 1.5 — ABNORMAL HIGH (ref 0.8–1.2)
Prothrombin Time: 17.5 seconds — ABNORMAL HIGH (ref 11.4–15.2)

## 2022-05-11 MED ORDER — FENTANYL CITRATE PF 50 MCG/ML IJ SOSY
12.5000 ug | PREFILLED_SYRINGE | Freq: Once | INTRAMUSCULAR | Status: AC
  Administered 2022-05-11: 12.5 ug via INTRAVENOUS
  Filled 2022-05-11: qty 1

## 2022-05-11 NOTE — ED Notes (Signed)
Patient returned from [place] with [place], RT ( trauma nurse )

## 2022-05-11 NOTE — ED Notes (Signed)
Left message with [name], advised EMS can't take the patient home, and that he is in room 45 to be picked up when they are able to.  Lexine Baton, ( charge, Therapist, sports ) and provider aware of transportation issue at this time.

## 2022-05-11 NOTE — Discharge Instructions (Signed)
As discussed, your evaluation today has been largely reassuring.  But, it is important that you monitor your condition carefully, and do not hesitate to return to the ED if you develop new, or concerning changes in your condition. ? ?Otherwise, please follow-up with your physician for appropriate ongoing care. ? ?

## 2022-05-11 NOTE — ED Provider Notes (Signed)
[facility] Provider Note   CSN: [id number] Arrival date & time: 05/11/22  1136     History  Chief Complaint  Patient presents with   Fall    On thinners    Ruben Murray is a 77 y.o. male.  HPI Patient presents via EMS as a level 2 trauma designation following head trauma.  Patient is on Coumadin.  Patient went to urgent care yesterday due to right thumb pain was diagnosed with gout.  On exiting the building he fell, striking his head.  He currently has pain in his head, no weakness in any extremities, no nausea, vomiting, no confusion.  Given concern for head trauma today he called EMS for evaluation.  EMS reports no hemodynamic instability en route.    Home Medications Prior to Admission medications   Medication Sig Start Date End Date Taking? Authorizing Provider  acetaminophen (TYLENOL) 500 MG tablet Take 1,000 mg by mouth at bedtime.    [provider]  busPIRone (BUSPAR) 5 MG tablet Take 5 mg by mouth 2 (two) times daily. 10/26/21   [provider]  Carboxymethylcellulose Sod PF 0.5 % SOLN Place 1 drop into both eyes daily as needed (dry eyes).    [provider]  Cholecalciferol (VITAMIN D3) 25 MCG (1000 UT) CAPS Take 1,000 Units by mouth daily.    [provider]  escitalopram (LEXAPRO) 20 MG tablet Take 20 mg by mouth at bedtime.    [provider]  finasteride (PROSCAR) 5 MG tablet TAKE 1 TABLET (5 MG TOTAL) BY MOUTH DAILY AT 12 NOON. 05/09/22   Bensimhon, Shaune Pascal, MD  fluticasone (FLONASE) 50 MCG/ACT nasal spray Place 1 spray into both nostrils 2 (two) times daily. 10/24/17   [provider]  JARDIANCE 10 MG TABS tablet TAKE 1 TABLET BY MOUTH DAILY BEFORE BREAKFAST. 11/17/21   Bensimhon, Shaune Pascal, MD  loratadine (CLARITIN) 10 MG tablet Take 10 mg by mouth every morning.    [provider]  losartan (COZAAR) 25 MG tablet TAKE 1/2 TABLET BY MOUTH EVERY DAY 11/03/21    Bensimhon, Shaune Pascal, MD  metoprolol succinate (TOPROL-XL) 100 MG 24 hr tablet Take 1 tablet (100 mg total) by mouth 2 (two) times daily. Take with or immediately following a meal. 01/31/22   Milford, Maricela Bo, FNP  Multiple Vitamin (MULTIVITAMIN WITH MINERALS) TABS tablet Take 1 tablet by mouth every morning.    [provider]  PHENobarbital (LUMINAL) 97.2 MG tablet Take 97.2 mg by mouth every evening. Takes between 3:00-5:00pm.    [provider]  potassium chloride SA (KLOR-CON M) 20 MEQ tablet Take 1 tablet (20 mEq total) by mouth daily. 12/21/21   Bensimhon, Shaune Pascal, MD  pravastatin (PRAVACHOL) 40 MG tablet Take 40 mg by mouth at bedtime.     [provider]  spironolactone (ALDACTONE) 25 MG tablet Take 0.5 tablets (12.5 mg total) by mouth daily. 03/15/22   Larey Dresser, MD  torsemide (DEMADEX) 20 MG tablet Take 3 tablets (60 mg total) by mouth daily. 04/05/22 04/05/23  Bensimhon, Shaune Pascal, MD  triamcinolone cream (KENALOG) 0.1 % Apply 1 application  topically 2 (two) times daily as needed (rash). 05/13/21   [provider]  warfarin (COUMADIN) 5 MG tablet Take 5 mg by mouth at bedtime. 10/12/21         Allergies    Dilaudid [hydromorphone hcl], Hydromorphone, Morphine sulfate, Tegretol [carbamazepine], and Carbamazepine    Review  of Systems   Review of Systems  All other systems reviewed and are negative.   Physical Exam Updated Vital Signs BP 106/65 (BP Location: Left Arm)   Pulse 87   Temp 98.1 F (36.7 C) (Oral)   Resp 15   Ht '5\' 8"'$  (1.727 m)   Wt 108.9 kg   SpO2 100%   BMI 36.49 kg/m  Physical Exam Vitals and nursing note reviewed.  Constitutional:      General: He is not in acute distress.    Appearance: He is well-developed.  HENT:     Head: Normocephalic and atraumatic.     Comments: No obvious deformity, no active bleeding. Eyes:     Conjunctiva/sclera: Conjunctivae normal.  Cardiovascular:     Rate and Rhythm: Normal rate  and regular rhythm.  Pulmonary:     Effort: Pulmonary effort is normal. No respiratory distress.     Breath sounds: No stridor.  Abdominal:     General: There is no distension.  Musculoskeletal:     Cervical back: Neck supple. No rigidity.  Skin:    General: Skin is warm and dry.  Neurological:     Mental Status: He is alert and oriented to person, place, and time.     Cranial Nerves: No cranial nerve deficit.     Motor: No weakness.     Coordination: Coordination normal.     ED Results / Procedures / Treatments   Labs (all labs ordered are listed, but only abnormal results are displayed) Labs Reviewed  COMPREHENSIVE METABOLIC PANEL - Abnormal; Notable for the following components:      Result Value   Glucose, Bld 100 (*)    BUN 26 (*)    Creatinine, Ser 1.45 (*)    Calcium 8.5 (*)    Albumin 3.4 (*)    GFR, Estimated 49 (*)    All other components within normal limits  PROTIME-INR - Abnormal; Notable for the following components:   Prothrombin Time 17.5 (*)    INR 1.5 (*)    All other components within normal limits  I-STAT CHEM 8, ED - Abnormal; Notable for the following components:   BUN 29 (*)    Creatinine, Ser 1.50 (*)    Glucose, Bld 100 (*)    Calcium, Ion 0.94 (*)    All other components within normal limits  CBC  LACTIC ACID, PLASMA    EKG None  Radiology CT HEAD WO CONTRAST  Result Date: 05/11/2022 CLINICAL DATA:  Trauma EXAM: CT HEAD WITHOUT CONTRAST CT MAXILLOFACIAL WITHOUT CONTRAST CT CERVICAL SPINE WITHOUT CONTRAST TECHNIQUE: Multidetector CT imaging of the head, cervical spine, and maxillofacial structures were performed using the standard protocol without intravenous contrast. Multiplanar CT image reconstructions of the cervical spine and maxillofacial structures were also generated. RADIATION DOSE REDUCTION: This exam was performed according to the departmental dose-optimization program which includes automated exposure control, adjustment of the mA  and/or kV according to patient size and/or use of iterative reconstruction technique. COMPARISON:  CT Head 12/27/2008, CT Chest 03/06/16 FINDINGS: CT HEAD FINDINGS Brain: No evidence of acute infarction, hemorrhage, hydrocephalus, extra-axial collection or mass lesion/mass effect. Sequela of mild chronic microvascular ischemic change. Vascular: No hyperdense vessel or unexpected calcification. Skull: Normal. Negative for fracture or focal lesion. Other: None. CT MAXILLOFACIAL FINDINGS Osseous: No fracture or mandibular dislocation. No destructive process. Patient is edentulous along the maxillary arch. There are multiple dental caries along the residual teeth along the mandibular arch. There is also a large  central carie around the root of the right mandibular central incisor. Orbits: Bilateral lens replacement. Orbits are otherwise unremarkable. Sinuses: No middle ear or mastoid effusion. There is complete opacification of the right maxillary sinus with widening of the right maxillary ostium and extension of masslike soft tissue in the nasal cavity. Region in the partially imaged in 2010, but likely redemonstrated similar findings of the time. There is also near-complete opacification of the anterior ethmoid air cells on right. Remainder of the paranasal sinuses are clear. Soft tissues: Negative. CT CERVICAL SPINE FINDINGS Alignment: There is straightening of the normal cervical lordosis. Skull base and vertebrae: There is no evidence of an acute fracture. There is an irregular lytic lesion of the superior endplate of T1. This was present in 2018 and likely represents a hemangioma. Soft tissues and spinal canal: No prevertebral fluid or swelling. No visible canal hematoma. Disc levels:  No CT evidence of high-grade spinal canal stenosis. Upper chest: Negative. Other: None IMPRESSION: 1. No CT evidence of intracranial injury. 2. No acute facial bone fracture. 3. No acute fracture or traumatic malalignment of the  cervical spine. 4. Complete opacification of the right maxillary sinus with widening of the right maxillary ostium and extension of masslike soft tissue in the nasal cavity. Findings may reflect an antrochoanal polyp, however recommend ENT referral for direct visualization. Electronically Signed   By: Marin Roberts M.D.   On: 05/11/2022 12:48   CT CERVICAL SPINE WO CONTRAST  Result Date: 05/11/2022 CLINICAL DATA:  Trauma EXAM: CT HEAD WITHOUT CONTRAST CT MAXILLOFACIAL WITHOUT CONTRAST CT CERVICAL SPINE WITHOUT CONTRAST TECHNIQUE: Multidetector CT imaging of the head, cervical spine, and maxillofacial structures were performed using the standard protocol without intravenous contrast. Multiplanar CT image reconstructions of the cervical spine and maxillofacial structures were also generated. RADIATION DOSE REDUCTION: This exam was performed according to the departmental dose-optimization program which includes automated exposure control, adjustment of the mA and/or kV according to patient size and/or use of iterative reconstruction technique. COMPARISON:  CT Head 12/27/2008, CT Chest 03/06/16 FINDINGS: CT HEAD FINDINGS Brain: No evidence of acute infarction, hemorrhage, hydrocephalus, extra-axial collection or mass lesion/mass effect. Sequela of mild chronic microvascular ischemic change. Vascular: No hyperdense vessel or unexpected calcification. Skull: Normal. Negative for fracture or focal lesion. Other: None. CT MAXILLOFACIAL FINDINGS Osseous: No fracture or mandibular dislocation. No destructive process. Patient is edentulous along the maxillary arch. There are multiple dental caries along the residual teeth along the mandibular arch. There is also a large central carie around the root of the right mandibular central incisor. Orbits: Bilateral lens replacement. Orbits are otherwise unremarkable. Sinuses: No middle ear or mastoid effusion. There is complete opacification of the right maxillary sinus with widening  of the right maxillary ostium and extension of masslike soft tissue in the nasal cavity. Region in the partially imaged in 2010, but likely redemonstrated similar findings of the time. There is also near-complete opacification of the anterior ethmoid air cells on right. Remainder of the paranasal sinuses are clear. Soft tissues: Negative. CT CERVICAL SPINE FINDINGS Alignment: There is straightening of the normal cervical lordosis. Skull base and vertebrae: There is no evidence of an acute fracture. There is an irregular lytic lesion of the superior endplate of T1. This was present in 2018 and likely represents a hemangioma. Soft tissues and spinal canal: No prevertebral fluid or swelling. No visible canal hematoma. Disc levels:  No CT evidence of high-grade spinal canal stenosis. Upper chest: Negative. Other: None IMPRESSION: 1.  No CT evidence of intracranial injury. 2. No acute facial bone fracture. 3. No acute fracture or traumatic malalignment of the cervical spine. 4. Complete opacification of the right maxillary sinus with widening of the right maxillary ostium and extension of masslike soft tissue in the nasal cavity. Findings may reflect an antrochoanal polyp, however recommend ENT referral for direct visualization. Electronically Signed   By: Marin Roberts M.D.   On: 05/11/2022 12:48   CT MAXILLOFACIAL WO CONTRAST  Result Date: 05/11/2022 CLINICAL DATA:  Trauma EXAM: CT HEAD WITHOUT CONTRAST CT MAXILLOFACIAL WITHOUT CONTRAST CT CERVICAL SPINE WITHOUT CONTRAST TECHNIQUE: Multidetector CT imaging of the head, cervical spine, and maxillofacial structures were performed using the standard protocol without intravenous contrast. Multiplanar CT image reconstructions of the cervical spine and maxillofacial structures were also generated. RADIATION DOSE REDUCTION: This exam was performed according to the departmental dose-optimization program which includes automated exposure control, adjustment of the mA and/or  kV according to patient size and/or use of iterative reconstruction technique. COMPARISON:  CT Head 12/27/2008, CT Chest 03/06/16 FINDINGS: CT HEAD FINDINGS Brain: No evidence of acute infarction, hemorrhage, hydrocephalus, extra-axial collection or mass lesion/mass effect. Sequela of mild chronic microvascular ischemic change. Vascular: No hyperdense vessel or unexpected calcification. Skull: Normal. Negative for fracture or focal lesion. Other: None. CT MAXILLOFACIAL FINDINGS Osseous: No fracture or mandibular dislocation. No destructive process. Patient is edentulous along the maxillary arch. There are multiple dental caries along the residual teeth along the mandibular arch. There is also a large central carie around the root of the right mandibular central incisor. Orbits: Bilateral lens replacement. Orbits are otherwise unremarkable. Sinuses: No middle ear or mastoid effusion. There is complete opacification of the right maxillary sinus with widening of the right maxillary ostium and extension of masslike soft tissue in the nasal cavity. Region in the partially imaged in 2010, but likely redemonstrated similar findings of the time. There is also near-complete opacification of the anterior ethmoid air cells on right. Remainder of the paranasal sinuses are clear. Soft tissues: Negative. CT CERVICAL SPINE FINDINGS Alignment: There is straightening of the normal cervical lordosis. Skull base and vertebrae: There is no evidence of an acute fracture. There is an irregular lytic lesion of the superior endplate of T1. This was present in 2018 and likely represents a hemangioma. Soft tissues and spinal canal: No prevertebral fluid or swelling. No visible canal hematoma. Disc levels:  No CT evidence of high-grade spinal canal stenosis. Upper chest: Negative. Other: None IMPRESSION: 1. No CT evidence of intracranial injury. 2. No acute facial bone fracture. 3. No acute fracture or traumatic malalignment of the cervical  spine. 4. Complete opacification of the right maxillary sinus with widening of the right maxillary ostium and extension of masslike soft tissue in the nasal cavity. Findings may reflect an antrochoanal polyp, however recommend ENT referral for direct visualization. Electronically Signed   By: Marin Roberts M.D.   On: 05/11/2022 12:48    Procedures Procedures    Medications Ordered in ED Medications - No data to display  ED Course/ Medical Decision Making/ A&P                             Medical Decision Making Adult male with multiple medical problems A-fib for which she takes Coumadin presents after a fall that occurred yesterday.  Given persistent pain, anticoagulation use, differential including intracranial hemorrhage, fracture, other traumatic injury is considered, CT is ordered, patient  placed on continuous monitoring. Cardiac 90 A-fib abnormal Pulse ox 97% room air normal   Amount and/or Complexity of Data Reviewed Independent Historian: EMS Labs: ordered. Decision-making details documented in ED Course. Radiology: ordered and independent interpretation performed. Decision-making details documented in ED Course.  Risk OTC drugs. Prescription drug management. Decision regarding hospitalization.   1:26 PM On repeat exam patient is awake, alert, in no distress.  Labs notable for mildly subtherapeutic INR, which is likely beneficial in the context of head trauma.  CT imaging reviewed, no evidence for fracture, there is redemonstration of previously identified maxillary sinus congestion.  We discussed all findings, results, at length, and without evidence for acute hemorrhage, fracture, abnormality, now after 1 day since the injury he is appropriate for discharge with outpatient follow-up.        Final Clinical Impression(s) / ED Diagnoses Final diagnoses:  Fall, initial encounter    Rx / DC Orders ED Discharge Orders     None         Carmin Muskrat,  MD 05/11/22 1326

## 2022-05-11 NOTE — Progress Notes (Signed)
Orthopedic Tech Progress Note Patient Details:  Ruben Murray 10/30/1945 [id number]   Level 2 trauma. Thumb spica brace was applied to RUE following all pertinent exams and interventions.  Ortho Devices Type of Ortho Device: Thumb velcro splint Ortho Device/Splint Location: RUE Ortho Device/Splint Interventions: Ordered, Application, Adjustment   Post Interventions Patient Tolerated: Well Instructions Provided: Care of device, Adjustment of device  Ruben Murray 05/11/2022, 4:19 PM

## 2022-05-11 NOTE — ED Triage Notes (Signed)
Patient fell leaving urgent care yesterday, is on thinners, did hit his head, denies LOC, states he slept most of the day yesterday, no real complain today, home health nurse and primary caregiver asked him to come in to be evaluated.  Bp 138/90 97 HR 96% RA

## 2022-05-11 NOTE — Progress Notes (Signed)
   05/11/22 1236  Spiritual Encounters  Type of Visit Attempt (pt unavailable)  Referral source Trauma page   Responded to trauma, patient was being moved, unable to complete visit. Medical team disbursed.  Will request chaplain follow up

## 2022-05-11 NOTE — Progress Notes (Addendum)
Trauma Response Nurse Documentation   Ruben Murray is a 77 y.o. male arriving to [facility] ED via EMS.  On warfarin daily. Trauma was activated as a Level 2 by ED Charge RN based on the following trauma criteria Elderly patients > 65 with head trauma on anti-coagulation (excluding ASA). Trauma team at the bedside on patient arrival.   Patient cleared for CT by Dr. Vanita Panda. Pt transported to CT with trauma response nurse present to monitor. RN remained with the patient throughout their absence from the department for clinical observation.   GCS 15.  History   Past Medical History:  Diagnosis Date   Acquired thrombophilia ([place]) 01/15/2019   Aortic atherosclerosis ([place])    Aortic root dilatation (H[facility])    CAP (community acquired pneumonia) 03/06/2016   Chronic laryngitis [id number]   Chronic systolic CHF (congestive heart failure) ([place])    COPD GOLD II  08/02/2016   Quit smoking 2000  PFT's  07/10/2016  FEV1 1.98 (70 % ) ratio 67  p 19 % improvement from saba p nothing  prior to study while of coreg so rec as of 08/02/2016 try off coreg and on bisoprolol      Essential hypertension 03/06/2016   Changed from coreg to bisoprolol due to copd with reversible component  08/02/2016 >>>    Hoarseness 08/31/2016   Referred to ent 08/31/2016 >>> seen 10/03/16 [name] dx gerd and rhinitis medicamentosa   > improved on f/u 01/31/17 on gerd rx/ flonase and off afrin   Hyperlipidemia    Hypertension    Laryngopharyngeal reflux (LPR) 10/03/2016   Mitral regurgitation    Moderate persistent asthma 08/02/2016   Morbid (severe) obesity due to excess calories ([place]) 08/02/2016   Nasal polyps 10/03/2016   NICM (nonischemic cardiomyopathy) ([place])    a. EF 40-45% in 04/2016, etiology not defined, managed medically.   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   Rhinitis medicamentosa 10/03/2016   Right thyroid  nodule 03/06/2016   Seizures ([place])    "take RX daily" (03/06/2016)   Sensorineural hearing loss (SNHL) of both ears 01/31/2017   Tricuspid regurgitation      Past Surgical History:  Procedure Laterality Date   BIV ICD INSERTION CRT-D N/A 02/03/2022   Procedure: BIV ICD INSERTION CRT-D;  Surgeon: [name], MD;  Location: [facility] [facility];  Service: Cardiovascular;  Laterality: N/A;   CARDIOVERSION N/A 12/13/2017   Procedure: CARDIOVERSION;  Surgeon: [name], MD;  Location: [facility];  Service: Cardiovascular;  Laterality: N/A;   CARDIOVERSION N/A 09/25/2018   Procedure: CARDIOVERSION;  Surgeon: [name], MD;  Location: [facility] ENDOSCOPY;  Service: Cardiovascular;  Laterality: N/A;   CARDIOVERSION N/A 11/04/2018   Procedure: CARDIOVERSION;  Surgeon: [name], MD;  Location: [facility] ENDOSCOPY;  Service: Cardiovascular;  Laterality: N/A;   CARDIOVERSION N/A 04/11/2019   Procedure: CARDIOVERSION;  Surgeon: [name], MD;  Location: [facility] ENDOSCOPY;  Service: Cardiovascular;  Laterality: N/A;   CIRCUMCISION     JOINT REPLACEMENT     PERCUTANEOUS PINNING TOE FRACTURE Left    "big toe   REPLACEMENT TOTAL KNEE Left    RIGHT/LEFT HEART CATH AND CORONARY ANGIOGRAPHY N/A 11/06/2019   Procedure: RIGHT/LEFT HEART CATH AND CORONARY ANGIOGRAPHY;  Surgeon: [name], MD;  Location: [place] [facility];  Service: Cardiovascular;  Laterality: N/A;   TONSILLECTOMY  AND ADENOIDECTOMY         Initial Focused Assessment (If applicable, or please see trauma documentation): Patient fell leaving urgent care yesterday, is on thinners, reported that he hit his head, denies LOC. States he slept most of the day yesterday, no real complaints today, home health nurse and primary caregiver asked him to come for evaluation. Pt alert and oriented upon arrival. VSS. PIV placed, blood drawn. Transport to/from CT with TRN.   CT's Completed:   CT Head, CT Maxillofacial, and CT C-Spine    Interventions:   Plan for disposition:  Discharge home.   Consults completed:  none .    Bedside handoff with ED RN Leafy Ro.    Latanja Lehenbauer L Mosella Kasa  Trauma Response RN  Please call TRN at [phone] for further assistance.

## 2022-05-16 ENCOUNTER — Ambulatory Visit: Payer: No Typology Code available for payment source | Attending: Cardiology | Admitting: *Deleted

## 2022-05-16 DIAGNOSIS — Z7901 Long term (current) use of anticoagulants: Secondary | ICD-10-CM | POA: Diagnosis not present

## 2022-05-16 DIAGNOSIS — I2699 Other pulmonary embolism without acute cor pulmonale: Secondary | ICD-10-CM

## 2022-05-16 DIAGNOSIS — I4891 Unspecified atrial fibrillation: Secondary | ICD-10-CM

## 2022-05-16 LAB — POCT INR: INR: 2.8 (ref 2.0–3.0)

## 2022-05-16 NOTE — Patient Instructions (Addendum)
Description   Continue taking warfarin 1 tablet daily except 1/2 tablet on Mondays and Fridays. Recheck INR in 4 weeks. [facility] [phone] or [phone]

## 2022-05-17 ENCOUNTER — Ambulatory Visit: Payer: No Typology Code available for payment source | Attending: Cardiology | Admitting: Cardiology

## 2022-05-17 ENCOUNTER — Other Ambulatory Visit (HOSPITAL_COMMUNITY): Payer: Self-pay

## 2022-05-17 ENCOUNTER — Encounter: Payer: Self-pay | Admitting: Cardiology

## 2022-05-17 VITALS — BP 94/52 | HR 84 | Ht 68.0 in | Wt 240.0 lb

## 2022-05-17 DIAGNOSIS — I4891 Unspecified atrial fibrillation: Secondary | ICD-10-CM

## 2022-05-17 DIAGNOSIS — D6869 Other thrombophilia: Secondary | ICD-10-CM

## 2022-05-17 DIAGNOSIS — I5022 Chronic systolic (congestive) heart failure: Secondary | ICD-10-CM

## 2022-05-17 DIAGNOSIS — G4733 Obstructive sleep apnea (adult) (pediatric): Secondary | ICD-10-CM

## 2022-05-17 NOTE — Patient Instructions (Signed)
Medication Instructions:  Your physician recommends that you continue on your current medications as directed. Please refer to the Current Medication list given to you today.  *If you need a refill on your cardiac medications before your next appointment, please call your pharmacy*   Follow-Up: At [facility], you and your health needs are our priority.  As part of our continuing mission to provide you with exceptional heart care, we have created designated Provider Care Teams.  These Care Teams include your primary Cardiologist (physician) and Advanced Practice Providers (APPs -  Physician Assistants and Nurse Practitioners) who all work together to provide you with the care you need, when you need it.  We recommend signing up for the patient portal called "MyChart".  Sign up information is provided on this After Visit Summary.  MyChart is used to connect with patients for Virtual Visits (Telemedicine).  Patients are able to view lab/test results, encounter notes, upcoming appointments, etc.  Non-urgent messages can be sent to your provider as well.   To learn more about what you can do with MyChart, go to [email or web address].    Your next appointment:   6 month(s)  Provider:   [name], MD

## 2022-05-17 NOTE — Progress Notes (Signed)
Cardiology Office Note:    Date:  05/17/2022   ID:  Ruben Murray, DOB 1945-12-22, MRN [id number]  PCP:  [name], MD   [facility] Providers Cardiologist:  [name], MD Electrophysiologist:  [name], MD     Referring MD: [name], MD    History of Present Illness:    Ruben Murray is a 77 y.o. male here for follow-up of nonischemic cardiomyopathy.  Previously seen by Dr. Haroldine Laws as well as Dr. Curt Bears.  Ejection fraction was in the 25% range.  He underwent CRT-D implantation on 02/03/2022 by Dr. Curt Bears, [place].  Nonischemic cardiomyopathy, permanent atrial fibrillation, secondary pulmonary hypertension, obstructive sleep apnea, seizure disorder COPD and hypertension.  Seen in [facility] 3/23, volume trending up. Torsemide increased and spiro started. Referred to paramedicine.   Readmitted 5/23 with AFib with RVR, HR 150's, INR 3.8. CTA chest with small PE w/ R heart strain, also with volume overload. Echo with EF 20%. No DOAC due to hx of seizures and phenobarbital use.  AFib controlled with metoprolol 100 bid and digoxin (dig previously stopped due to elevated level). PMT consulted for [place], and he remained DNR. He was discharged home with HH, weight 228 lbs.   HF follow up with paramedic, Nira [name]. Stable NYHA III. SOB with minimal exertion.   His wife has dementia and broken hips.  Cardiac Testing  - Echo 2/18 EF 40-45%, RV normal, mod TR - Echo 8/19 EF 25%, RV moderately reduced (in Afib) - Echo 1/20 EF 40-45%, RV mildly dilated, systolic fx normal  - Echo 8/21 EF 20-25%, RV moderately reduced, RVSP 38, Mild MR, mild TR - LHC 9/21 Normal coronaries, RHC mild pulmonary hypertension; PA 34/22, mean PA pressure 28 mmHg  - Echo 2/23 EF 20-25%, RV mildly reduced, RVSP 48, mild MR, mild TR  - Echo 5/23: EF 20%, RV moderately down, moderate to severe TR, mild MR (he was in AF and volume overloaded).     Past Medical History:  Diagnosis Date    Acquired thrombophilia ([place]) 01/15/2019   Aortic atherosclerosis ([place])    Aortic root dilatation (HCC)    CAP (community acquired pneumonia) 03/06/2016   Chronic laryngitis [id number]   Chronic systolic CHF (congestive heart failure) ([place])    COPD GOLD II  08/02/2016   Quit smoking 2000  PFT's  07/10/2016  FEV1 1.98 (70 % ) ratio 67  p 19 % improvement from saba p nothing  prior to study while of coreg so rec as of 08/02/2016 try off coreg and on bisoprolol      Essential hypertension 03/06/2016   Changed from coreg to bisoprolol due to copd with reversible component  08/02/2016 >>>    Hoarseness 08/31/2016   Referred to ent 08/31/2016 >>> seen 10/03/16 [name] dx gerd and rhinitis medicamentosa   > improved on f/u 01/31/17 on gerd rx/ flonase and off afrin   Hyperlipidemia    Hypertension    Laryngopharyngeal reflux (LPR) 10/03/2016   Mitral regurgitation    Moderate persistent asthma 08/02/2016   Morbid (severe) obesity due to excess calories ([place]) 08/02/2016   Nasal polyps 10/03/2016   NICM (nonischemic cardiomyopathy) ([place])    a. EF 40-45% in 04/2016, etiology not defined, managed medically.   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   Rhinitis medicamentosa 10/03/2016   Right thyroid nodule 03/06/2016  Seizures ([place])    "take RX daily" (03/06/2016)   Sensorineural hearing loss (SNHL) of both ears 01/31/2017   Tricuspid regurgitation     Past Surgical History:  Procedure Laterality Date   BIV ICD INSERTION CRT-D N/A 02/03/2022   Procedure: BIV ICD INSERTION CRT-D;  Surgeon: [name], MD;  Location: [place] [facility];  Service: Cardiovascular;  Laterality: N/A;   CARDIOVERSION N/A 12/13/2017   Procedure: CARDIOVERSION;  Surgeon: [name], MD;  Location: [place];  Service: Cardiovascular;  Laterality: N/A;   CARDIOVERSION N/A 09/25/2018   Procedure: CARDIOVERSION;   Surgeon: [name], MD;  Location: [facility] ENDOSCOPY;  Service: Cardiovascular;  Laterality: N/A;   CARDIOVERSION N/A 11/04/2018   Procedure: CARDIOVERSION;  Surgeon: [name], MD;  Location: [facility] ENDOSCOPY;  Service: Cardiovascular;  Laterality: N/A;   CARDIOVERSION N/A 04/11/2019   Procedure: CARDIOVERSION;  Surgeon: [name], MD;  Location: [facility] ENDOSCOPY;  Service: Cardiovascular;  Laterality: N/A;   CIRCUMCISION     JOINT REPLACEMENT     PERCUTANEOUS PINNING TOE FRACTURE Left    "big toe   REPLACEMENT TOTAL KNEE Left    RIGHT/LEFT HEART CATH AND CORONARY ANGIOGRAPHY N/A 11/06/2019   Procedure: RIGHT/LEFT HEART CATH AND CORONARY ANGIOGRAPHY;  Surgeon: [name], MD;  Location: [place] [facility];  Service: Cardiovascular;  Laterality: N/A;   TONSILLECTOMY AND ADENOIDECTOMY      Current Medications: Current Meds  Medication Sig   acetaminophen (TYLENOL) 500 MG tablet Take 1,000 mg by mouth at bedtime.   amiodarone (PACERONE) 200 MG tablet Take 200 mg by mouth 2 (two) times daily.   amLODipine (NORVASC) 5 MG tablet Take 5 mg by mouth daily.   busPIRone (BUSPAR) 5 MG tablet Take 5 mg by mouth 2 (two) times daily.   Carboxymethylcellulose Sod PF 0.5 % SOLN Place 1 drop into both eyes daily as needed (dry eyes).   Cholecalciferol (VITAMIN D3) 25 MCG (1000 UT) CAPS Take 1,000 Units by mouth daily.   colchicine 0.6 MG tablet Take 0.6 mg by mouth daily.   escitalopram (LEXAPRO) 20 MG tablet Take 20 mg by mouth at bedtime.   finasteride (PROSCAR) 5 MG tablet TAKE 1 TABLET (5 MG TOTAL) BY MOUTH DAILY AT 12 NOON.   fluticasone (FLONASE) 50 MCG/ACT nasal spray Place 1 spray into both nostrils 2 (two) times daily.   furosemide (LASIX) 40 MG tablet Take 40 mg by mouth daily.   JARDIANCE 10 MG TABS tablet TAKE 1 TABLET BY MOUTH DAILY BEFORE BREAKFAST.   loratadine (CLARITIN) 10 MG tablet Take 10 mg by mouth every morning.   losartan (COZAAR) 25 MG tablet TAKE 1/2 TABLET BY MOUTH  EVERY DAY   metoprolol succinate (TOPROL-XL) 100 MG 24 hr tablet Take 1 tablet (100 mg total) by mouth 2 (two) times daily. Take with or immediately following a meal.   Multiple Vitamin (MULTIVITAMIN WITH MINERALS) TABS tablet Take 1 tablet by mouth every morning.   PHENobarbital (LUMINAL) 97.2 MG tablet Take 97.2 mg by mouth every evening. Takes between 3:00-5:00pm.   potassium chloride SA (KLOR-CON M) 20 MEQ tablet Take 1 tablet (20 mEq total) by mouth daily.   pravastatin (PRAVACHOL) 40 MG tablet Take 40 mg by mouth at bedtime.    spironolactone (ALDACTONE) 25 MG tablet Take 0.5 tablets (12.5 mg total) by mouth daily.   torsemide (DEMADEX) 20 MG tablet Take 3 tablets (60 mg total) by mouth daily.   triamcinolone cream (KENALOG) 0.1 %  Apply 1 application  topically 2 (two) times daily as needed (rash).   warfarin (COUMADIN) 5 MG tablet Take 5 mg by mouth at bedtime.     Allergies:   Dilaudid [hydromorphone hcl], Hydromorphone, Morphine sulfate, Tegretol [carbamazepine], and Carbamazepine   Social History   Socioeconomic History   Marital status: Married    Spouse name: Not on file   Number of children: Not on file   Years of education: Not on file   Highest education level: Not on file  Occupational History   Not on file  Tobacco Use   Smoking status: Former    Packs/day: 3.00    Years: 48.00    Total pack years: 144.00    Types: Cigarettes    Quit date: 2000    Years since quitting: 24.2   Smokeless tobacco: Never  Vaping Use   Vaping Use: Never used  Substance and Sexual Activity   Alcohol use: Never   Drug use: Never   Sexual activity: Never  Other Topics Concern   Not on file  Social History Narrative   ** Merged History Encounter **       Social Determinants of Health   Financial Resource Strain: Medium Risk (10/26/2021)   Overall Financial Resource Strain (CARDIA)    Difficulty of Paying Living Expenses: Somewhat hard  Food Insecurity: No Food Insecurity  (02/04/2022)   Hunger Vital Sign    Worried About Running Out of Food in the Last Year: Never true    Ran Out of Food in the Last Year: Never true  Transportation Needs: Unmet Transportation Needs (02/04/2022)   PRAPARE - Hydrologist (Medical): Yes    Lack of Transportation (Non-Medical): No  Physical Activity: Inactive (12/15/2019)   Exercise Vital Sign    Days of Exercise per Week: 0 days    Minutes of Exercise per Session: 0 min  Stress: Stress Concern Present (09/26/2018)   [place]    Feeling of Stress : To some extent  Social Connections: Not on file     Family History: The patient's family history includes Hypertension in his mother; Other in his father.  ROS:   Please see the history of present illness.     All other systems reviewed and are negative.  EKGs/Labs/Other Studies Reviewed:    Cardiac Studies & Procedures   CARDIAC CATHETERIZATION  CARDIAC CATHETERIZATION 11/06/2019  Narrative Findings are consistent with a dilated nonischemic cardiomyopathy.  Normal coronary arteries with a codominant system.  Mild right heart pressure elevation with mild pulmonary hypertension; PA 34/22, mean PA pressure 28 mm.  Atrial flutter with variable block and rapid ventricular response.  RECOMMENDATION: Guideline directed medical therapy for nonischemic cardiomyopathy.  In this patient with recent Covid 19 positivity consider post Covid myocarditis.  Findings Coronary Findings Diagnostic  Dominance: Co-dominant  No diagnostic findings have been documented. Intervention  No interventions have been documented.     ECHOCARDIOGRAM  ECHOCARDIOGRAM COMPLETE 07/27/2021  Narrative ECHOCARDIOGRAM REPORT    Patient Name:   LAQUINCY BRY Date of Exam: 07/27/2021 Medical Rec #:  [id number]       Height:       68.0 in Accession #:    [id number]      Weight:       232.1 lb Date  of Birth:  [birth year]       BSA:          2.177 m  Patient Age:    31 years        BP:           122/78 mmHg Patient Gender: M               HR:           76 bpm. Exam Location:  Inpatient  Procedure: 2D Echo, Cardiac Doppler and Color Doppler  Indications:    I50.23 Acute on chronic systolic (congestive) heart failure  History:        Patient has prior history of Echocardiogram examinations, most recent 04/20/2021. Cardiomyopathy, Arrythmias:Atrial Flutter and Atrial Fibrillation, Signs/Symptoms:Shortness of Breath and Fatigue; Risk Factors:Sleep Apnea, Hypertension, Dyslipidemia and Former Smoker.  Sonographer:    Wilkie Aye RVT Referring Phys: [id number] [place]   Sonographer Comments: Suboptimal apical window. Patients inability to reposition. IMPRESSIONS   1. Left ventricular ejection fraction, by estimation, is 20%. The left ventricle has severely decreased function. The left ventricle demonstrates global hypokinesis. The left ventricular internal cavity size was mildly dilated. Left ventricular diastolic parameters are indeterminate. 2. Right ventricular systolic function is moderately reduced. The right ventricular size is severely enlarged. There is normal pulmonary artery systolic pressure. The estimated right ventricular systolic pressure is [id number] mmHg. 3. Left atrial size was mildly dilated. 4. Right atrial size was moderately dilated. 5. The mitral valve is normal in structure. Mild mitral valve regurgitation. No evidence of mitral stenosis. 6. Tricuspid valve regurgitation is moderate to severe. 7. The aortic valve is tricuspid. There is mild calcification of the aortic valve. Aortic valve regurgitation is mild. Aortic valve sclerosis is present, with no evidence of aortic valve stenosis. 8. Aortic dilatation noted. There is mild dilatation of the aortic root, measuring 37 mm. 9. The inferior vena cava is dilated in size with <50% respiratory variability, suggesting  right atrial pressure of 15 mmHg. 10. The patient was in atrial fibrillation.  Comparison(s): Left ventricular ejection fraction, by estimation, is 20 to 25%.  FINDINGS Left Ventricle: Left ventricular ejection fraction, by estimation, is 20%. The left ventricle has severely decreased function. The left ventricle demonstrates global hypokinesis. The left ventricular internal cavity size was mildly dilated. There is no left ventricular hypertrophy. Left ventricular diastolic parameters are indeterminate.  Right Ventricle: The right ventricular size is severely enlarged. No increase in right ventricular wall thickness. Right ventricular systolic function is moderately reduced. There is normal pulmonary artery systolic pressure. The tricuspid regurgitant velocity is 2.23 m/s, and with an assumed right atrial pressure of 15 mmHg, the estimated right ventricular systolic pressure is [id number] mmHg.  Left Atrium: Left atrial size was mildly dilated.  Right Atrium: Right atrial size was moderately dilated.  Pericardium: Trivial pericardial effusion is present.  Mitral Valve: The mitral valve is normal in structure. Mild mitral valve regurgitation. No evidence of mitral valve stenosis.  Tricuspid Valve: The tricuspid valve is normal in structure. Tricuspid valve regurgitation is moderate to severe.  Aortic Valve: The aortic valve is tricuspid. There is mild calcification of the aortic valve. Aortic valve regurgitation is mild. Aortic valve sclerosis is present, with no evidence of aortic valve stenosis. Aortic valve mean gradient measures 1.0 mmHg. Aortic valve peak gradient measures 1.8 mmHg. Aortic valve area, by VTI measures 2.50 cm.  Pulmonic Valve: The pulmonic valve was normal in structure. Pulmonic valve regurgitation is trivial.  Aorta: Aortic dilatation noted. There is mild dilatation of the aortic root, measuring 37 mm.  Venous: The inferior vena cava is  dilated in size with less than 50%  respiratory variability, suggesting right atrial pressure of 15 mmHg.  IAS/Shunts: No atrial level shunt detected by color flow Doppler.   LEFT VENTRICLE PLAX 2D LVIDd:         6.20 cm LVIDs:         5.30 cm LV PW:         1.30 cm LV IVS:        1.10 cm LVOT diam:     2.20 cm      3D Volume EF: LV SV:         28           3D EF:        24 % LV SV Index:   13           LV EDV:       274 ml LVOT Area:     3.80 cm     LV ESV:       207 ml LV SV:        66 ml  LV Volumes (MOD) LV vol d, MOD A4C: 191.0 ml LV vol s, MOD A4C: 117.0 ml LV SV MOD A4C:     191.0 ml  RIGHT VENTRICLE            IVC RV Mid diam:    5.00 cm    IVC diam: 3.60 cm RV S prime:     8.05 cm/s TAPSE (M-mode): 2.0 cm  LEFT ATRIUM           Index        RIGHT ATRIUM           Index LA diam:      4.90 cm 2.25 cm/m   RA Area:     36.60 cm LA Vol (A4C): 47.8 ml 21.95 ml/m  RA Volume:   160.00 ml 73.49 ml/m AORTIC VALVE                    PULMONIC VALVE AV Area (Vmax):    2.66 cm     PV Vmax:       0.54 m/s AV Area (Vmean):   2.13 cm     PV Peak grad:  1.1 mmHg AV Area (VTI):     2.50 cm AV Vmax:           66.20 cm/s AV Vmean:          54.200 cm/s AV VTI:            0.111 m AV Peak Grad:      1.8 mmHg AV Mean Grad:      1.0 mmHg LVOT Vmax:         46.27 cm/s LVOT Vmean:        30.300 cm/s LVOT VTI:          0.073 m LVOT/AV VTI ratio: 0.66 AR Vena Contracta: 0.50 cm  AORTA Ao Root diam: 3.70 cm Ao Asc diam:  3.20 cm  TRICUSPID VALVE TR Peak grad:   19.9 mmHg TR Vmax:        223.00 cm/s  SHUNTS Systemic VTI:  0.07 m Systemic Diam: 2.20 cm  Dalton McleanMD Electronically signed by Franki Monte Signature Date/Time: 07/27/2021/3:38:42 PM    Final              Recent Labs: 08/01/2021: Magnesium 2.4 01/27/2022: B Natriuretic Peptide 205.3 05/11/2022: ALT 22; BUN 29; Creatinine, Ser 1.50; Hemoglobin 15.6;  Platelets 167; Potassium 4.7; Sodium 137  Recent Lipid Panel    Component Value  Date/Time   CHOL 110 07/30/2021 0256   TRIG 67 07/30/2021 0256   HDL 38 (L) 07/30/2021 0256   CHOLHDL 2.9 07/30/2021 0256   VLDL 13 07/30/2021 0256   LDLCALC 59 07/30/2021 0256     Risk Assessment/Calculations:              Physical Exam:    VS:  BP (!) 94/52 (BP Location: Left Arm, Patient Position: Sitting, Cuff Size: Normal)   Pulse 84   Ht '5\' 8"'$  (1.727 m)   Wt 240 lb (108.9 kg)   SpO2 98%   BMI 36.49 kg/m     Wt Readings from Last 3 Encounters:  05/17/22 240 lb (108.9 kg)  05/17/22 235 lb 12.8 oz (107 kg)  05/11/22 240 lb (108.9 kg)    GEN: Well nourished, well developed, in no acute distress HEENT: normal Neck: no JVD, carotid bruits, or masses Cardiac: RRR; no murmurs, rubs, or gallops,no edema  Respiratory:  clear to auscultation bilaterally, normal work of breathing GI: soft, nontender, nondistended, + BS MS: no deformity or atrophy Skin: warm and dry, no rash Neuro:  Alert and Oriented x 3, Strength and sensation are intact Psych: euthymic mood, full affect   ASSESSMENT:    1. Atrial fibrillation, unspecified type ([place])   2. Chronic systolic (congestive) heart failure ([name])   3. OSA (obstructive sleep apnea)   4. Secondary hypercoagulable state ([place])     PLAN:    In order of problems listed above:   1. Chronic Systolic Heart Failure/[place] CRT-D 02/03/2022 - NICM. Dates back to at least 2018, EFs have fluctuated from 20-40% - most recent echo 2/22 EF 20-25%, RV moderately reduced - LHC in 2021 showed normal coronaries - suspect tachy mediated CM from long history of chronic, poorly rate controlled Afib/flutter  - He does not tolerate rapid Afib well, in setting of severe LV dysfunction  - NYHA II-III chronically. Volume looks good.  - Continue losartan 12.5 mg daily. [name] cost-prohibitive) - Continue torsemide 60 mg daily. - Continue digoxin 0.125 mg daily. - Continue Jardiance 10 mg daily. - Continue metoprolol tartrate 100 mg bid will  switch to succinate 100 bid - Continue spiro 12.5 mg daily.   - BP too low to titrate -Blood work reviewed, potassium and creatinine stable. -45% CRT on device report   2. Permanent Atrial Fibrillation  - failed multiple cardioversions and AAD therapy . - issues w/ Rapid Afib in the past, complicating HF - Rate control w/ metoprolol and digoxin. CHeck digoxin level. - Dr. Curt Bears CRT-D.  - On chronic Coumadin, last INR 2.8 therapeutic   3. PE, bilateral small sub-segmental - new on CTA 5/23 2/2 sub-therapeutic INR - No DOAC due to interaction with phenobarb. - On Coumadin.  Trying to maintain INR between 2 and 3.   4. Pulmonary Hypertension  - Suspect combination WHO Group 2 and 3 - PFTs in 2018 showed moderate OAD. Also w/ OSA stable   5. OSA  -No longer on CPAP.  Sleeps on a slight incline. Dr. Maxwell Caul used to see him.   6. HTN - Well-controlled.  Stable   7. Medication non-compliance - Much improved, appreciate Dr. Haroldine Laws and his heart failure team. - paramedicine, appreciate their assistance.         Medication Adjustments/Labs and Tests Ordered: Current medicines are reviewed at length with the patient today.  Concerns regarding  medicines are outlined above.  No orders of the defined types were placed in this encounter.  No orders of the defined types were placed in this encounter.   Patient Instructions  Medication Instructions:  Your physician recommends that you continue on your current medications as directed. Please refer to the Current Medication list given to you today.  *If you need a refill on your cardiac medications before your next appointment, please call your pharmacy*   Follow-Up: At [facility], you and your health needs are our priority.  As part of our continuing mission to provide you with exceptional heart care, we have created designated Provider Care Teams.  These Care Teams include your primary Cardiologist (physician) and  Advanced Practice Providers (APPs -  Physician Assistants and Nurse Practitioners) who all work together to provide you with the care you need, when you need it.  We recommend signing up for the patient portal called "MyChart".  Sign up information is provided on this After Visit Summary.  MyChart is used to connect with patients for Virtual Visits (Telemedicine).  Patients are able to view lab/test results, encounter notes, upcoming appointments, etc.  Non-urgent messages can be sent to your provider as well.   To learn more about what you can do with MyChart, go to [email or web address].    Your next appointment:   6 month(s)  Provider:   [name], MD       Signed, [name], MD  05/17/2022 2:29 PM    [place]

## 2022-05-17 NOTE — Progress Notes (Signed)
Paramedicine Encounter    Patient ID: Ruben Murray, male    DOB: [birth year], 77 y.o.   MRN: [id number]   Complaints- right hand pain improving but still present.   Assessment- CAOX4, warm and dry, seated in his chair denying any chest pain, dizziness, lower leg swelling, shortness of breath. He complains his right hand is still swollen with some pain but improved since last week. No lower leg swelling. Lungs clear. Vitals as noted.   Compliance with meds- no missed doses over the last week.   Pill box filled- for one week.   Refills needed- Loratadine, Buspar   Meds changes since last visit- Colchicine 3 doses given- last dose taken last night.     Social changes- NONE    BP 102/68   Pulse 72   Resp 16   Wt 235 lb 12.8 oz (107 kg)   SpO2 96%   BMI 35.85 kg/m  Weight yesterday-237lbs Last visit weight-239lbs  Arrived for home visit for Ruben Murray who reports to be feeling okay today. He denied any chest pain, shortness of breath, dizziness. He says last week he had the right hand pain and swelling with redness and was seen at [facility] [place] for same and was started on a 3 dose colchicine dose. He completed same taking the last dose last night. While in the parking lot of the UC last week he lost his balance falling backwards striking his head a few times on the pavement- he did not seek immediate care but the next day his care giver called to tell me he had fallen and didn't sleep well. I recommended him be seen in the ER for follow up- he went to [facility] ED for same and was evaluated and sent home with no findings. Today he denied any headaches or vision changes and reports he has bene sleeping fine. He says he has tried to improve his diet over the last week- his weight is down 4 lbs this week. I reviewed meds and filled pill box for one week - also giving him his morning meds at 0940. He has an appointment to see Dr. [name] today. He agreed for home visit follow up next  Wednesday. Home visit complete. I encouraged heart healthy diet and using his walker at all times for stability while walking- he agreed. I will see Ruben Murray in one week.   Refills- called [facility].     [name], [place]  ACTION: Home visit completed    Patient Care Team: [name], MD as PCP - General (Family Medicine) [name], MD as PCP - Cardiology (Cardiology) [name], MD as PCP - Electrophysiology (Cardiology) [name], MD (Family Medicine) [place] as Case Manager (Hospice and Palliative Medicine)  Patient Active Problem List   Diagnosis Date Noted   CHF (congestive heart failure) ([place]) 02/03/2022   Atrial fibrillation ([place]) 11/02/2021   Long term (current) use of anticoagulants 11/02/2021   AKI (acute kidney injury) ([place]) 07/27/2021   Acute pulmonary embolism ([place]) 07/26/2021   Acute on chronic systolic CHF (congestive heart failure) ([place]) 05/17/2021   Hypercoagulable state ([place]) 05/10/2021   Left leg pain 04/24/2021   OSA on CPAP 04/24/2021   Mitral regurgitation 04/24/2021   Tricuspid regurgitation 04/24/2021   Dilated aortic root ([place]) 04/24/2021   Permanent atrial fibrillation ([place]) 04/19/2021   Chronic arthropathy 04/13/2020   Callus 04/13/2020   Onychomycosis 12/12/2019   Left leg cellulitis 12/12/2019  Chronic systolic heart failure (HCC) 12/09/2019   Dilated cardiomyopathy (HCC)    Atrial flutter ([place]) 11/03/2019   Atrial flutter with rapid ventricular response ([place]) 11/03/2019   Acquired thrombophilia ([place]) 01/15/2019   Atrial fibrillation with rapid ventricular response ([place]) 10/29/2017   Chronic combined systolic and diastolic heart failure ([place]) 10/29/2017   Prolonged QT interval 10/29/2017   Sensorineural hearing loss (SNHL) of both ears 01/31/2017   Dysphonia 10/03/2016   Laryngopharyngeal reflux (LPR) 10/03/2016   Nasal polyps 10/03/2016   Rhinitis medicamentosa  10/03/2016   Chronic laryngitis 10/03/2016   Hoarseness 08/31/2016   Moderate persistent asthma 08/02/2016   COPD (chronic obstructive pulmonary disease) ([place]) 08/02/2016   Morbid (severe) obesity due to excess calories ([place]) 08/02/2016   Community acquired pneumonia 03/06/2016   Essential hypertension 03/06/2016   Mixed hyperlipidemia 03/06/2016   Right thyroid nodule 03/06/2016   Seizures ([place]) 03/06/2016   Fever 03/12/2015   Recurrent erosion of cornea 06/23/2011    Current Outpatient Medications:    acetaminophen (TYLENOL) 500 MG tablet, Take 1,000 mg by mouth at bedtime., Disp: , Rfl:    busPIRone (BUSPAR) 5 MG tablet, Take 5 mg by mouth 2 (two) times daily., Disp: , Rfl:    Carboxymethylcellulose Sod PF 0.5 % SOLN, Place 1 drop into both eyes daily as needed (dry eyes)., Disp: , Rfl:    Cholecalciferol (VITAMIN D3) 25 MCG (1000 UT) CAPS, Take 1,000 Units by mouth daily., Disp: , Rfl:    escitalopram (LEXAPRO) 20 MG tablet, Take 20 mg by mouth at bedtime., Disp: , Rfl:    finasteride (PROSCAR) 5 MG tablet, TAKE 1 TABLET (5 MG TOTAL) BY MOUTH DAILY AT 12 NOON., Disp: 90 tablet, Rfl: 1   fluticasone (FLONASE) 50 MCG/ACT nasal spray, Place 1 spray into both nostrils 2 (two) times daily., Disp: , Rfl:    JARDIANCE 10 MG TABS tablet, TAKE 1 TABLET BY MOUTH DAILY BEFORE BREAKFAST., Disp: 90 tablet, Rfl: 3   loratadine (CLARITIN) 10 MG tablet, Take 10 mg by mouth every morning., Disp: , Rfl:    losartan (COZAAR) 25 MG tablet, TAKE 1/2 TABLET BY MOUTH EVERY DAY, Disp: 45 tablet, Rfl: 3   metoprolol succinate (TOPROL-XL) 100 MG 24 hr tablet, Take 1 tablet (100 mg total) by mouth 2 (two) times daily. Take with or immediately following a meal., Disp: 180 tablet, Rfl: 3   Multiple Vitamin (MULTIVITAMIN WITH MINERALS) TABS tablet, Take 1 tablet by mouth every morning., Disp: , Rfl:    PHENobarbital (LUMINAL) 97.2 MG tablet, Take 97.2 mg by mouth every evening. Takes between 3:00-5:00pm., Disp: ,  Rfl:    potassium chloride SA (KLOR-CON M) 20 MEQ tablet, Take 1 tablet (20 mEq total) by mouth daily., Disp: 90 tablet, Rfl: 3   pravastatin (PRAVACHOL) 40 MG tablet, Take 40 mg by mouth at bedtime. , Disp: , Rfl:    spironolactone (ALDACTONE) 25 MG tablet, Take 0.5 tablets (12.5 mg total) by mouth daily., Disp: 45 tablet, Rfl: 2   torsemide (DEMADEX) 20 MG tablet, Take 3 tablets (60 mg total) by mouth daily., Disp: 90 tablet, Rfl: 2   triamcinolone cream (KENALOG) 0.1 %, Apply 1 application  topically 2 (two) times daily as needed (rash)., Disp: , Rfl:    warfarin (COUMADIN) 5 MG tablet, Take 5 mg by mouth at bedtime., Disp: , Rfl:  Allergies  Allergen Reactions   Dilaudid [Hydromorphone Hcl] Other (See Comments)    Makes pt hyper    Hydromorphone Other (See  Comments)    hyperactiviity Other reaction(s): made him wild Other reaction(s): Unknown   Morphine Sulfate     Other reaction(s): at high dose causes confusion Other reaction(s): at high dose causes confusion Other reaction(s): at high dose causes confusion, Other (See Comments) Other reaction(s): at high dose causes confusion Other reaction(s): at high dose causes confusion    Tegretol [Carbamazepine] Hives   Carbamazepine Hives, Rash and Other (See Comments)    Other reaction(s): hives Other reaction(s): Hives Other reaction(s): hives     Social History   Socioeconomic History   Marital status: Married    Spouse name: Not on file   Number of children: Not on file   Years of education: Not on file   Highest education level: Not on file  Occupational History   Not on file  Tobacco Use   Smoking status: Former    Packs/day: 3.00    Years: 48.00    Total pack years: 144.00    Types: Cigarettes    Quit date: 2000    Years since quitting: 24.2   Smokeless tobacco: Never  Vaping Use   Vaping Use: Never used  Substance and Sexual Activity   Alcohol use: Never   Drug use: Never   Sexual activity: Never  Other  Topics Concern   Not on file  Social History Narrative   ** Merged History Encounter **       Social Determinants of Health   Financial Resource Strain: Medium Risk (10/26/2021)   Overall Financial Resource Strain (CARDIA)    Difficulty of Paying Living Expenses: Somewhat hard  Food Insecurity: No Food Insecurity (02/04/2022)   Hunger Vital Sign    Worried About Running Out of Food in the Last Year: Never true    Ran Out of Food in the Last Year: Never true  Transportation Needs: Unmet Transportation Needs (02/04/2022)   PRAPARE - Hydrologist (Medical): Yes    Lack of Transportation (Non-Medical): No  Physical Activity: Inactive (12/15/2019)   Exercise Vital Sign    Days of Exercise per Week: 0 days    Minutes of Exercise per Session: 0 min  Stress: Stress Concern Present (09/26/2018)   [place]    Feeling of Stress : To some extent  Social Connections: Not on file  Intimate Partner Violence: Not At Risk (02/04/2022)   Humiliation, Afraid, Rape, and Kick questionnaire    Fear of Current or Ex-Partner: No    Emotionally Abused: No    Physically Abused: No    Sexually Abused: No    Physical Exam      Future Appointments  Date Time Provider [place]  05/17/2022  2:00 PM [name], MD [facility]  06/13/2022  1:30 PM [facility] None  06/21/2022  2:00 PM [place] [facility]  06/21/2022  3:00 PM Bensimhon, Shaune Pascal, MD [facility] None  08/08/2022  7:05 AM [facility] DEVICE REMOTES [facility]  11/07/2022  7:10 AM [facility] DEVICE REMOTES [facility]  02/06/2023  7:05 AM [facility] DEVICE REMOTES [facility]  05/08/2023  7:05 AM [facility] DEVICE REMOTES [facility]  08/07/2023  7:05 AM [facility] DEVICE REMOTES [facility]  11/06/2023  7:05 AM [facility] DEVICE  REMOTES [facility]  02/05/2024  7:05 AM [facility] DEVICE REMOTES [facility]

## 2022-05-19 ENCOUNTER — Encounter: Payer: Medicare PPO | Admitting: Cardiology

## 2022-05-21 DIAGNOSIS — I5042 Chronic combined systolic (congestive) and diastolic (congestive) heart failure: Secondary | ICD-10-CM | POA: Diagnosis not present

## 2022-05-24 ENCOUNTER — Other Ambulatory Visit (HOSPITAL_COMMUNITY): Payer: Self-pay

## 2022-05-24 NOTE — Progress Notes (Unsigned)
Paramedicine Encounter    Patient ID: Ruben Murray, male    DOB: 03/31/1945, 77 y.o.   MRN: [id number]   Patient Care Team: [name], MD as PCP - General (Family Medicine) [name], MD as PCP - Cardiology (Cardiology) [name], MD as PCP - Electrophysiology (Cardiology) [name], MD (Family Medicine) [place] as Case Manager (Hospice and Palliative Medicine)  Patient Active Problem List   Diagnosis Date Noted   CHF (congestive heart failure) ([place]) 02/03/2022   Atrial fibrillation ([place]) 11/02/2021   Long term (current) use of anticoagulants 11/02/2021   AKI (acute kidney injury) ([place]) 07/27/2021   Acute pulmonary embolism ([place]) 07/26/2021   Acute on chronic systolic CHF (congestive heart failure) ([place]) 05/17/2021   Hypercoagulable state ([place]) 05/10/2021   Left leg pain 04/24/2021   OSA on CPAP 04/24/2021   Mitral regurgitation 04/24/2021   Tricuspid regurgitation 04/24/2021   Dilated aortic root ([place]) 04/24/2021   Permanent atrial fibrillation ([place]) 04/19/2021   Chronic arthropathy 04/13/2020   Callus 04/13/2020   Onychomycosis 12/12/2019   Left leg cellulitis 0000000   Chronic systolic heart failure ([place]) 12/09/2019   Dilated cardiomyopathy ([place])    Atrial flutter ([place]) 11/03/2019   Atrial flutter with rapid ventricular response ([place]) 11/03/2019   Acquired thrombophilia ([place]) 01/15/2019   Atrial fibrillation with rapid ventricular response ([place]) 10/29/2017   Chronic combined systolic and diastolic heart failure ([place]) 10/29/2017   Prolonged QT interval 10/29/2017   Sensorineural hearing loss (SNHL) of both ears 01/31/2017   Dysphonia 10/03/2016   Laryngopharyngeal reflux (LPR) 10/03/2016   Nasal polyps 10/03/2016   Rhinitis medicamentosa 10/03/2016   Chronic laryngitis 10/03/2016   Hoarseness 08/31/2016   Moderate persistent asthma 08/02/2016   COPD (chronic obstructive pulmonary disease) ([place]) 08/02/2016   Morbid  (severe) obesity due to excess calories ([place]) 08/02/2016   Community acquired pneumonia 03/06/2016   Essential hypertension 03/06/2016   Mixed hyperlipidemia 03/06/2016   Right thyroid nodule 03/06/2016   Seizures ([place]) 03/06/2016   Fever 03/12/2015   Recurrent erosion of cornea 06/23/2011    Current Outpatient Medications:    acetaminophen (TYLENOL) 500 MG tablet, Take 1,000 mg by mouth at bedtime., Disp: , Rfl:    amiodarone (PACERONE) 200 MG tablet, Take 200 mg by mouth 2 (two) times daily., Disp: , Rfl:    amLODipine (NORVASC) 5 MG tablet, Take 5 mg by mouth daily., Disp: , Rfl:    busPIRone (BUSPAR) 5 MG tablet, Take 5 mg by mouth 2 (two) times daily., Disp: , Rfl:    Carboxymethylcellulose Sod PF 0.5 % SOLN, Place 1 drop into both eyes daily as needed (dry eyes)., Disp: , Rfl:    Cholecalciferol (VITAMIN D3) 25 MCG (1000 UT) CAPS, Take 1,000 Units by mouth daily., Disp: , Rfl:    colchicine 0.6 MG tablet, Take 0.6 mg by mouth daily., Disp: , Rfl:    escitalopram (LEXAPRO) 20 MG tablet, Take 20 mg by mouth at bedtime., Disp: , Rfl:    finasteride (PROSCAR) 5 MG tablet, TAKE 1 TABLET (5 MG TOTAL) BY MOUTH DAILY AT 12 NOON., Disp: 90 tablet, Rfl: 1   fluticasone (FLONASE) 50 MCG/ACT nasal spray, Place 1 spray into both nostrils 2 (two) times daily., Disp: , Rfl:    furosemide (LASIX) 40 MG tablet, Take 40 mg by mouth daily., Disp: , Rfl:    JARDIANCE 10 MG TABS tablet, TAKE 1 TABLET BY MOUTH DAILY BEFORE BREAKFAST., Disp: 90 tablet, Rfl: 3  loratadine (CLARITIN) 10 MG tablet, Take 10 mg by mouth every morning., Disp: , Rfl:    losartan (COZAAR) 25 MG tablet, TAKE 1/2 TABLET BY MOUTH EVERY DAY, Disp: 45 tablet, Rfl: 3   metoprolol succinate (TOPROL-XL) 100 MG 24 hr tablet, Take 1 tablet (100 mg total) by mouth 2 (two) times daily. Take with or immediately following a meal., Disp: 180 tablet, Rfl: 3   Multiple Vitamin (MULTIVITAMIN WITH MINERALS) TABS tablet, Take 1 tablet by mouth every  morning., Disp: , Rfl:    PHENobarbital (LUMINAL) 97.2 MG tablet, Take 97.2 mg by mouth every evening. Takes between 3:00-5:00pm., Disp: , Rfl:    potassium chloride SA (KLOR-CON M) 20 MEQ tablet, Take 1 tablet (20 mEq total) by mouth daily., Disp: 90 tablet, Rfl: 3   pravastatin (PRAVACHOL) 40 MG tablet, Take 40 mg by mouth at bedtime. , Disp: , Rfl:    spironolactone (ALDACTONE) 25 MG tablet, Take 0.5 tablets (12.5 mg total) by mouth daily., Disp: 45 tablet, Rfl: 2   torsemide (DEMADEX) 20 MG tablet, Take 3 tablets (60 mg total) by mouth daily., Disp: 90 tablet, Rfl: 2   triamcinolone cream (KENALOG) 0.1 %, Apply 1 application  topically 2 (two) times daily as needed (rash)., Disp: , Rfl:    warfarin (COUMADIN) 5 MG tablet, Take 5 mg by mouth at bedtime., Disp: , Rfl:  Allergies  Allergen Reactions   Dilaudid [Hydromorphone Hcl] Other (See Comments)    Makes pt hyper    Hydromorphone Other (See Comments)    hyperactiviity Other reaction(s): made him wild Other reaction(s): Unknown   Morphine Sulfate     Other reaction(s): at high dose causes confusion Other reaction(s): at high dose causes confusion Other reaction(s): at high dose causes confusion, Other (See Comments) Other reaction(s): at high dose causes confusion Other reaction(s): at high dose causes confusion    Tegretol [Carbamazepine] Hives   Carbamazepine Hives, Rash and Other (See Comments)    Other reaction(s): hives Other reaction(s): Hives Other reaction(s): hives     Social History   Socioeconomic History   Marital status: Married    Spouse name: Not on file   Number of children: Not on file   Years of education: Not on file   Highest education level: Not on file  Occupational History   Not on file  Tobacco Use   Smoking status: Former    Packs/day: 3.00    Years: 48.00    Additional pack years: 0.00    Total pack years: 144.00    Types: Cigarettes    Quit date: 2000    Years since quitting: 24.2    Smokeless tobacco: Never  Vaping Use   Vaping Use: Never used  Substance and Sexual Activity   Alcohol use: Never   Drug use: Never   Sexual activity: Never  Other Topics Concern   Not on file  Social History Narrative   ** Merged History Encounter **       Social Determinants of Health   Financial Resource Strain: Medium Risk (10/26/2021)   Overall Financial Resource Strain (CARDIA)    Difficulty of Paying Living Expenses: Somewhat hard  Food Insecurity: No Food Insecurity (02/04/2022)   Hunger Vital Sign    Worried About Running Out of Food in the Last Year: Never true    Ran Out of Food in the Last Year: Never true  Transportation Needs: Unmet Transportation Needs (02/04/2022)   PRAPARE - Transportation    Lack of Transportation (  Medical): Yes    Lack of Transportation (Non-Medical): No  Physical Activity: Inactive (12/15/2019)   Exercise Vital Sign    Days of Exercise per Week: 0 days    Minutes of Exercise per Session: 0 min  Stress: Stress Concern Present (09/26/2018)   [place]    Feeling of Stress : To some extent  Social Connections: Not on file  Intimate Partner Violence: Not At Risk (02/04/2022)   Humiliation, Afraid, Rape, and Kick questionnaire    Fear of Current or Ex-Partner: No    Emotionally Abused: No    Physically Abused: No    Sexually Abused: No    Physical Exam      Future Appointments  Date Time Provider [place]  06/13/2022  1:30 PM [facility] None  06/21/2022  2:00 PM [place] [facility] OP 1 [facility]  06/21/2022  3:00 PM Bensimhon, Shaune Pascal, MD [facility] None  08/08/2022  7:05 AM [facility] DEVICE REMOTES [facility]  11/07/2022  7:10 AM [facility] DEVICE REMOTES [facility]  11/24/2022  3:00 PM [name], MD [facility]  02/06/2023  7:05 AM [facility] DEVICE REMOTES [facility]  05/08/2023  7:05  AM [facility] DEVICE REMOTES [facility]  08/07/2023  7:05 AM [facility] DEVICE REMOTES [facility]  11/06/2023  7:05 AM [facility] DEVICE REMOTES [facility]  02/05/2024  7:05 AM [facility] DEVICE REMOTES [facility]     ACTION: {Paramed Action:[id number]}

## 2022-05-25 ENCOUNTER — Ambulatory Visit (INDEPENDENT_AMBULATORY_CARE_PROVIDER_SITE_OTHER): Payer: No Typology Code available for payment source

## 2022-05-25 DIAGNOSIS — I420 Dilated cardiomyopathy: Secondary | ICD-10-CM

## 2022-05-25 LAB — CUP PACEART REMOTE DEVICE CHECK
Date Time Interrogation Session: 20240321134956
Implantable Lead Connection Status: 753985
Implantable Lead Connection Status: 753985
Implantable Lead Connection Status: 753985
Implantable Lead Implant Date: 20231201
Implantable Lead Implant Date: 20231201
Implantable Lead Implant Date: 20231201
Implantable Lead Location: 753858
Implantable Lead Location: 753859
Implantable Lead Location: 753860
Implantable Pulse Generator Implant Date: 20231201
Pulse Gen Serial Number: 211010361

## 2022-05-31 DIAGNOSIS — H918X3 Other specified hearing loss, bilateral: Secondary | ICD-10-CM | POA: Diagnosis not present

## 2022-06-06 ENCOUNTER — Telehealth (HOSPITAL_COMMUNITY): Payer: Self-pay | Admitting: Licensed Clinical Social Worker

## 2022-06-06 NOTE — Telephone Encounter (Signed)
HF Paramedicine Team Based Care Meeting  HF MD- NA  HF NP - [place] NP-C   [facility] admit within the last 30 days for heart failure? no  Medications concerns? Cannot take medications on how own and has not been teachable to set up his own pill box.  Does not know the difference between most medications and lives in an area where pill boxes are not an option.  Transportation issues ? Has neighbors who help so not an issue at this time.  Education needs? Continued education needed on medications and disease symptoms and how to report them.  Also needs continued education on diet education.  SDOH- lives alone- wife recently passed away   Eligible for discharge? Think patient is high risk for readmission due to lack of knowledge about his medications and no at home support.  Jorge Ny, LCSW Clinical Social Worker [facility] Desk#: [phone] Cell#: [phone]

## 2022-06-07 ENCOUNTER — Other Ambulatory Visit (HOSPITAL_COMMUNITY): Payer: Self-pay

## 2022-06-07 NOTE — Progress Notes (Signed)
Paramedicine Encounter    Patient ID: Ruben Murray, male    DOB: [birth year], 77 y.o.   MRN: [id number]   Complaints- Hearing loss   Assessment- CAOx4, warm and dry, ambulatory with some shortness of breath, no lower leg swelling or edema, lungs clear.   Compliance with meds- No missed doses in the last two weeks   Pill box filled- for one week   Refills needed- loratadine   Meds changes since last visit- none     Social changes- none    BP 130/62   Pulse 74   Resp 16   Wt 243 lb (110.2 kg)   SpO2 96%   BMI 36.95 kg/m  Weight yesterday-244lbs Last visit weight-- 237lbs (two weeks ago)   Arrived for home visit for Ruben Murray who took several attempts to come to the door after ringing the door bell and calling his phone numerous times. He reports he has had some hearing problems lately where it's been harder for him to hear out of both ears. He says that this happened suddenly and he has reached out to his PCP about this but they haven't called back. I called PCP office today and left message for his PCP's nurse to call me back in reference to possible follow up with Audiology or ENT. I obtained vitals which were within normal limits. Lungs clear, no swelling. Weight is up from two weeks ago but not up 3lbs overnight any of the days he's charted. He reports being constipated at present and needing to get some Miralax- his friend Deveron Furlong will assist with this. I reviewed meds and confirmed sam filling pill box for one week. We reviewed upcoming appointments. Home visit complete. I will see Ruben Murray in one week.    [name], [place]  ACTION: Home visit completed    Patient Care Team: [name], MD as PCP - General (Family Medicine) [name], MD as PCP - Cardiology (Cardiology) [name], MD as PCP - Electrophysiology (Cardiology) [name], MD (Family Medicine) [place], [place] [facility] as Case Manager (Hospice and Palliative  Medicine)  Patient Active Problem List   Diagnosis Date Noted   CHF (congestive heart failure) 02/03/2022   Atrial fibrillation 11/02/2021   Long term (current) use of anticoagulants 11/02/2021   AKI (acute kidney injury) 07/27/2021   Acute pulmonary embolism 07/26/2021   Acute on chronic systolic CHF (congestive heart failure) 05/17/2021   Hypercoagulable state 05/10/2021   Left leg pain 04/24/2021   OSA on CPAP 04/24/2021   Mitral regurgitation 04/24/2021   Tricuspid regurgitation 04/24/2021   Dilated aortic root 04/24/2021   Permanent atrial fibrillation 04/19/2021   Chronic arthropathy 04/13/2020   Callus 04/13/2020   Onychomycosis 12/12/2019   Left leg cellulitis 0000000   Chronic systolic heart failure [id number]   Dilated cardiomyopathy    Atrial flutter 11/03/2019   Atrial flutter with rapid ventricular response 11/03/2019   Acquired thrombophilia 01/15/2019   Atrial fibrillation with rapid ventricular response 10/29/2017   Chronic combined systolic and diastolic heart failure [id number]   Prolonged QT interval 10/29/2017   Sensorineural hearing loss (SNHL) of both ears 01/31/2017   Dysphonia 10/03/2016   Laryngopharyngeal reflux (LPR) 10/03/2016   Nasal polyps 10/03/2016   Rhinitis medicamentosa 10/03/2016   Chronic laryngitis 10/03/2016   Hoarseness 08/31/2016   Moderate persistent asthma 08/02/2016   COPD (chronic obstructive pulmonary disease) 08/02/2016   Morbid (severe) obesity due to excess calories 08/02/2016   Community acquired pneumonia 03/06/2016  Essential hypertension 03/06/2016   Mixed hyperlipidemia 03/06/2016   Right thyroid nodule 03/06/2016   Seizures 03/06/2016   Fever 03/12/2015   Recurrent erosion of cornea 06/23/2011    Current Outpatient Medications:    acetaminophen (TYLENOL) 500 MG tablet, Take 1,000 mg by mouth at bedtime., Disp: , Rfl:    busPIRone (BUSPAR) 5 MG tablet, Take 5 mg by mouth 2 (two) times daily., Disp: , Rfl:     Carboxymethylcellulose Sod PF 0.5 % SOLN, Place 1 drop into both eyes daily as needed (dry eyes)., Disp: , Rfl:    Cholecalciferol (VITAMIN D3) 25 MCG (1000 UT) CAPS, Take 1,000 Units by mouth daily., Disp: , Rfl:    escitalopram (LEXAPRO) 20 MG tablet, Take 20 mg by mouth at bedtime., Disp: , Rfl:    finasteride (PROSCAR) 5 MG tablet, TAKE 1 TABLET (5 MG TOTAL) BY MOUTH DAILY AT 12 NOON., Disp: 90 tablet, Rfl: 1   fluticasone (FLONASE) 50 MCG/ACT nasal spray, Place 1 spray into both nostrils 2 (two) times daily., Disp: , Rfl:    JARDIANCE 10 MG TABS tablet, TAKE 1 TABLET BY MOUTH DAILY BEFORE BREAKFAST., Disp: 90 tablet, Rfl: 3   loratadine (CLARITIN) 10 MG tablet, Take 10 mg by mouth every morning., Disp: , Rfl:    losartan (COZAAR) 25 MG tablet, TAKE 1/2 TABLET BY MOUTH EVERY DAY, Disp: 45 tablet, Rfl: 3   metoprolol succinate (TOPROL-XL) 100 MG 24 hr tablet, Take 1 tablet (100 mg total) by mouth 2 (two) times daily. Take with or immediately following a meal., Disp: 180 tablet, Rfl: 3   Multiple Vitamin (MULTIVITAMIN WITH MINERALS) TABS tablet, Take 1 tablet by mouth every morning., Disp: , Rfl:    PHENobarbital (LUMINAL) 97.2 MG tablet, Take 97.2 mg by mouth every evening. Takes between 3:00-5:00pm., Disp: , Rfl:    potassium chloride SA (KLOR-CON M) 20 MEQ tablet, Take 1 tablet (20 mEq total) by mouth daily., Disp: 90 tablet, Rfl: 3   pravastatin (PRAVACHOL) 40 MG tablet, Take 40 mg by mouth at bedtime. , Disp: , Rfl:    spironolactone (ALDACTONE) 25 MG tablet, Take 0.5 tablets (12.5 mg total) by mouth daily., Disp: 45 tablet, Rfl: 2   torsemide (DEMADEX) 20 MG tablet, Take 3 tablets (60 mg total) by mouth daily., Disp: 90 tablet, Rfl: 2   triamcinolone cream (KENALOG) 0.1 %, Apply 1 application  topically 2 (two) times daily as needed (rash)., Disp: , Rfl:    warfarin (COUMADIN) 5 MG tablet, Take 5 mg by mouth at bedtime., Disp: , Rfl:    amiodarone (PACERONE) 200 MG tablet, Take 200 mg by  mouth 2 (two) times daily. (Patient not taking: Reported on 05/24/2022), Disp: , Rfl:    amLODipine (NORVASC) 5 MG tablet, Take 5 mg by mouth daily. (Patient not taking: Reported on 05/24/2022), Disp: , Rfl:    colchicine 0.6 MG tablet, Take 0.6 mg by mouth daily. (Patient not taking: Reported on 06/07/2022), Disp: , Rfl:    furosemide (LASIX) 40 MG tablet, Take 40 mg by mouth daily. (Patient not taking: Reported on 06/07/2022), Disp: , Rfl:  Allergies  Allergen Reactions   Dilaudid [Hydromorphone Hcl] Other (See Comments)    Makes pt hyper    Hydromorphone Other (See Comments)    hyperactiviity Other reaction(s): made him wild Other reaction(s): Unknown   Morphine Sulfate     Other reaction(s): at high dose causes confusion Other reaction(s): at high dose causes confusion Other reaction(s): at high dose causes  confusion, Other (See Comments) Other reaction(s): at high dose causes confusion Other reaction(s): at high dose causes confusion    Tegretol [Carbamazepine] Hives   Carbamazepine Hives, Rash and Other (See Comments)    Other reaction(s): hives Other reaction(s): Hives Other reaction(s): hives     Social History   Socioeconomic History   Marital status: Married    Spouse name: Not on file   Number of children: Not on file   Years of education: Not on file   Highest education level: Not on file  Occupational History   Not on file  Tobacco Use   Smoking status: Former    Packs/day: 3.00    Years: 48.00    Additional pack years: 0.00    Total pack years: 144.00    Types: Cigarettes    Quit date: 2000    Years since quitting: 24.2   Smokeless tobacco: Never  Vaping Use   Vaping Use: Never used  Substance and Sexual Activity   Alcohol use: Never   Drug use: Never   Sexual activity: Never  Other Topics Concern   Not on file  Social History Narrative   ** Merged History Encounter **       Social Determinants of Health   Financial Resource Strain: Medium Risk  (10/26/2021)   Overall Financial Resource Strain (CARDIA)    Difficulty of Paying Living Expenses: Somewhat hard  Food Insecurity: No Food Insecurity (02/04/2022)   Hunger Vital Sign    Worried About Running Out of Food in the Last Year: Never true    Ran Out of Food in the Last Year: Never true  Transportation Needs: Unmet Transportation Needs (02/04/2022)   PRAPARE - Hydrologist (Medical): Yes    Lack of Transportation (Non-Medical): No  Physical Activity: Inactive (12/15/2019)   Exercise Vital Sign    Days of Exercise per Week: 0 days    Minutes of Exercise per Session: 0 min  Stress: Stress Concern Present (09/26/2018)   [place]    Feeling of Stress : To some extent  Social Connections: Not on file  Intimate Partner Violence: Not At Risk (02/04/2022)   Humiliation, Afraid, Rape, and Kick questionnaire    Fear of Current or Ex-Partner: No    Emotionally Abused: No    Physically Abused: No    Sexually Abused: No    Physical Exam      Future Appointments  Date Time Provider [place]  06/13/2022  1:30 PM [facility] None  06/21/2022  2:00 PM [place] E[facility] OP 1 [facility]  06/21/2022  3:00 PM Bensimhon, Shaune Pascal, MD [facility] None  11/24/2022  3:00 PM [name], MD [facility]

## 2022-06-13 ENCOUNTER — Ambulatory Visit: Payer: No Typology Code available for payment source | Attending: Cardiology | Admitting: *Deleted

## 2022-06-13 DIAGNOSIS — I2699 Other pulmonary embolism without acute cor pulmonale: Secondary | ICD-10-CM

## 2022-06-13 DIAGNOSIS — I4891 Unspecified atrial fibrillation: Secondary | ICD-10-CM

## 2022-06-13 DIAGNOSIS — Z7901 Long term (current) use of anticoagulants: Secondary | ICD-10-CM

## 2022-06-13 LAB — POCT INR: INR: 2.2 (ref 2.0–3.0)

## 2022-06-13 NOTE — Patient Instructions (Signed)
Description   Continue taking warfarin 1 tablet daily except 1/2 tablet on Mondays and Fridays. Recheck INR in 4 weeks. [facility] [phone] or [phone]      

## 2022-06-14 ENCOUNTER — Other Ambulatory Visit (HOSPITAL_COMMUNITY): Payer: Self-pay

## 2022-06-14 DIAGNOSIS — E7800 Pure hypercholesterolemia, unspecified: Secondary | ICD-10-CM | POA: Diagnosis not present

## 2022-06-14 DIAGNOSIS — J449 Chronic obstructive pulmonary disease, unspecified: Secondary | ICD-10-CM | POA: Diagnosis not present

## 2022-06-14 DIAGNOSIS — I5023 Acute on chronic systolic (congestive) heart failure: Secondary | ICD-10-CM | POA: Diagnosis not present

## 2022-06-14 DIAGNOSIS — F324 Major depressive disorder, single episode, in partial remission: Secondary | ICD-10-CM | POA: Diagnosis not present

## 2022-06-14 NOTE — Progress Notes (Signed)
Paramedicine Encounter    Patient ID: Ruben Murray, male    DOB: January 01, 1946, 77 y.o.   MRN: [id number]   Complaints- NONE   Assessment- CAOx4, warm and dry seated in his chair reporting no complaints.He states he is feeling good with no shortness of breath, dizziness or chest pain. He has no lower leg swelling, no weight gain. Lungs clear.   Compliance with meds- no missed doses over the last week   Pill box filled- for one week   Refills needed- none   Meds changes since last visit- none     Social changes- none    BP 118/70   Pulse 81   Resp 16   Wt 238 lb (108 kg)   SpO2 95%   BMI 36.19 kg/m  Weight yesterday-- 241lbs  Last visit weight-- 243lbs    [facility] where [facility] reports to be feeling well with no complaints. He denied shortness of breath, dizziness or chest pain. No lower leg swelling noted. Lungs clear. He has been compliant with his meds over the last week. I reviewed meds and filled pill box for one week. No refills needed. We reviewed HF educations with diet and meds. We reviewed upcoming appointments and confirmed same. He agreed to meet me there bringing all meds and pill boxes to appointment. Home visit complete. I will see Ruben Murray in one week.   Maralyn SagoHeather Spencer, EMT-Paramedic [phone]  ACTION: Home visit completed    Patient Care Team: Aliene BeamsHagler, Rachel, MD as PCP - General (Family Medicine) Jake BatheSkains, Mark C, MD as PCP - Cardiology (Cardiology) Regan Lemmingamnitz, Will [place], MD as PCP - Electrophysiology (Cardiology) Aliene BeamsHagler, Rachel, MD (Family Medicine) [place] as Case Manager (Hospice and Palliative Medicine)  Patient Active Problem List   Diagnosis Date Noted   CHF (congestive heart failure) 02/03/2022   Atrial fibrillation 11/02/2021   Long term (current) use of anticoagulants 11/02/2021   AKI (acute kidney injury) 07/27/2021   Acute pulmonary embolism 07/26/2021   Acute on chronic systolic CHF (congestive  heart failure) 05/17/2021   Hypercoagulable state 05/10/2021   Left leg pain 04/24/2021   OSA on CPAP 04/24/2021   Mitral regurgitation 04/24/2021   Tricuspid regurgitation 04/24/2021   Dilated aortic root 04/24/2021   Permanent atrial fibrillation 04/19/2021   Chronic arthropathy 04/13/2020   Callus 04/13/2020   Onychomycosis 12/12/2019   Left leg cellulitis 12/12/2019   Chronic systolic heart failure 12/09/2019   Dilated cardiomyopathy    Atrial flutter 11/03/2019   Atrial flutter with rapid ventricular response 11/03/2019   Acquired thrombophilia 01/15/2019   Atrial fibrillation with rapid ventricular response 10/29/2017   Chronic combined systolic and diastolic heart failure 10/29/2017   Prolonged QT interval 10/29/2017   Sensorineural hearing loss (SNHL) of both ears 01/31/2017   Dysphonia 10/03/2016   Laryngopharyngeal reflux (LPR) 10/03/2016   Nasal polyps 10/03/2016   Rhinitis medicamentosa 10/03/2016   Chronic laryngitis 10/03/2016   Hoarseness 08/31/2016   Moderate persistent asthma 08/02/2016   COPD (chronic obstructive pulmonary disease) 08/02/2016   Morbid (severe) obesity due to excess calories 08/02/2016   Community acquired pneumonia 03/06/2016   Essential hypertension 03/06/2016   Mixed hyperlipidemia 03/06/2016   Right thyroid nodule 03/06/2016   Seizures 03/06/2016   Fever 03/12/2015   Recurrent erosion of cornea 06/23/2011    Current Outpatient Medications:    acetaminophen (TYLENOL) 500 MG tablet, Take 1,000 mg by mouth at bedtime., Disp: , Rfl:    busPIRone (BUSPAR)  5 MG tablet, Take 5 mg by mouth 2 (two) times daily., Disp: , Rfl:    Carboxymethylcellulose Sod PF 0.5 % SOLN, Place 1 drop into both eyes daily as needed (dry eyes)., Disp: , Rfl:    Cholecalciferol (VITAMIN D3) 25 MCG (1000 UT) CAPS, Take 1,000 Units by mouth daily., Disp: , Rfl:    escitalopram (LEXAPRO) 20 MG tablet, Take 20 mg by mouth at bedtime., Disp: , Rfl:    finasteride  (PROSCAR) 5 MG tablet, TAKE 1 TABLET (5 MG TOTAL) BY MOUTH DAILY AT 12 NOON., Disp: 90 tablet, Rfl: 1   fluticasone (FLONASE) 50 MCG/ACT nasal spray, Place 1 spray into both nostrils 2 (two) times daily., Disp: , Rfl:    JARDIANCE 10 MG TABS tablet, TAKE 1 TABLET BY MOUTH DAILY BEFORE BREAKFAST., Disp: 90 tablet, Rfl: 3   loratadine (CLARITIN) 10 MG tablet, Take 10 mg by mouth every morning., Disp: , Rfl:    losartan (COZAAR) 25 MG tablet, TAKE 1/2 TABLET BY MOUTH EVERY DAY, Disp: 45 tablet, Rfl: 3   metoprolol succinate (TOPROL-XL) 100 MG 24 hr tablet, Take 1 tablet (100 mg total) by mouth 2 (two) times daily. Take with or immediately following a meal., Disp: 180 tablet, Rfl: 3   Multiple Vitamin (MULTIVITAMIN WITH MINERALS) TABS tablet, Take 1 tablet by mouth every morning., Disp: , Rfl:    PHENobarbital (LUMINAL) 97.2 MG tablet, Take 97.2 mg by mouth every evening. Takes between 3:00-5:00pm., Disp: , Rfl:    potassium chloride SA (KLOR-CON M) 20 MEQ tablet, Take 1 tablet (20 mEq total) by mouth daily., Disp: 90 tablet, Rfl: 3   pravastatin (PRAVACHOL) 40 MG tablet, Take 40 mg by mouth at bedtime. , Disp: , Rfl:    spironolactone (ALDACTONE) 25 MG tablet, Take 0.5 tablets (12.5 mg total) by mouth daily., Disp: 45 tablet, Rfl: 2   torsemide (DEMADEX) 20 MG tablet, Take 3 tablets (60 mg total) by mouth daily., Disp: 90 tablet, Rfl: 2   triamcinolone cream (KENALOG) 0.1 %, Apply 1 application  topically 2 (two) times daily as needed (rash)., Disp: , Rfl:    warfarin (COUMADIN) 5 MG tablet, Take 5 mg by mouth at bedtime., Disp: , Rfl:    amiodarone (PACERONE) 200 MG tablet, Take 200 mg by mouth 2 (two) times daily. (Patient not taking: Reported on 05/24/2022), Disp: , Rfl:    amLODipine (NORVASC) 5 MG tablet, Take 5 mg by mouth daily. (Patient not taking: Reported on 05/24/2022), Disp: , Rfl:    colchicine 0.6 MG tablet, Take 0.6 mg by mouth daily. (Patient not taking: Reported on 06/07/2022), Disp: , Rfl:     furosemide (LASIX) 40 MG tablet, Take 40 mg by mouth daily. (Patient not taking: Reported on 06/07/2022), Disp: , Rfl:  Allergies  Allergen Reactions   Dilaudid [Hydromorphone Hcl] Other (See Comments)    Makes pt hyper    Hydromorphone Other (See Comments)    hyperactiviity Other reaction(s): made him wild Other reaction(s): Unknown   Morphine Sulfate     Other reaction(s): at high dose causes confusion Other reaction(s): at high dose causes confusion Other reaction(s): at high dose causes confusion, Other (See Comments) Other reaction(s): at high dose causes confusion Other reaction(s): at high dose causes confusion    Tegretol [Carbamazepine] Hives   Carbamazepine Hives, Rash and Other (See Comments)    Other reaction(s): hives Other reaction(s): Hives Other reaction(s): hives     Social History   Socioeconomic History   Marital  status: Married    Spouse name: Not on file   Number of children: Not on file   Years of education: Not on file   Highest education level: Not on file  Occupational History   Not on file  Tobacco Use   Smoking status: Former    Packs/day: 3.00    Years: 48.00    Additional pack years: 0.00    Total pack years: 144.00    Types: Cigarettes    Quit date: 2000    Years since quitting: 24.2   Smokeless tobacco: Never  Vaping Use   Vaping Use: Never used  Substance and Sexual Activity   Alcohol use: Never   Drug use: Never   Sexual activity: Never  Other Topics Concern   Not on file  Social History Narrative   ** Merged History Encounter **       Social Determinants of Health   Financial Resource Strain: Medium Risk (10/26/2021)   Overall Financial Resource Strain (CARDIA)    Difficulty of Paying Living Expenses: Somewhat hard  Food Insecurity: No Food Insecurity (02/04/2022)   Hunger Vital Sign    Worried About Running Out of Food in the Last Year: Never true    Ran Out of Food in the Last Year: Never true  Transportation Needs:  Unmet Transportation Needs (02/04/2022)   PRAPARE - Administrator, Civil Service (Medical): Yes    Lack of Transportation (Non-Medical): No  Physical Activity: Inactive (12/15/2019)   Exercise Vital Sign    Days of Exercise per Week: 0 days    Minutes of Exercise per Session: 0 min  Stress: Stress Concern Present (09/26/2018)   [employer] of Occupational Health - Occupational Stress Questionnaire    Feeling of Stress : To some extent  Social Connections: Not on file  Intimate Partner Violence: Not At Risk (02/04/2022)   Humiliation, Afraid, Rape, and Kick questionnaire    Fear of Current or Ex-Partner: No    Emotionally Abused: No    Physically Abused: No    Sexually Abused: No    Physical Exam      Future Appointments  Date Time Provider Department Center  06/21/2022  2:00 PM [facility] ECHO OP 1 [facility]  06/21/2022  3:00 PM Bensimhon, Bevelyn Buckles, MD [facility] None  07/11/2022  1:30 PM [facility] None  11/24/2022  3:00 PM Jake Bathe, MD [facility]

## 2022-06-20 DIAGNOSIS — I5042 Chronic combined systolic (congestive) and diastolic (congestive) heart failure: Secondary | ICD-10-CM | POA: Diagnosis not present

## 2022-06-21 ENCOUNTER — Ambulatory Visit (HOSPITAL_COMMUNITY)
Admission: RE | Admit: 2022-06-21 | Discharge: 2022-06-21 | Disposition: A | Discharge status: Home / Self Care | Payer: No Typology Code available for payment source | Source: Ambulatory Visit | Attending: Family Medicine | Admitting: Family Medicine

## 2022-06-21 ENCOUNTER — Encounter (HOSPITAL_COMMUNITY): Payer: Self-pay | Admitting: Internal Medicine

## 2022-06-21 ENCOUNTER — Other Ambulatory Visit (HOSPITAL_COMMUNITY): Payer: Self-pay

## 2022-06-21 ENCOUNTER — Ambulatory Visit (HOSPITAL_COMMUNITY)
Admission: RE | Admit: 2022-06-21 | Discharge: 2022-06-21 | Disposition: A | Discharge status: Home / Self Care | Payer: No Typology Code available for payment source | Source: Ambulatory Visit | Attending: Internal Medicine | Admitting: Internal Medicine

## 2022-06-21 VITALS — BP 108/76 | HR 82 | Wt 245.0 lb

## 2022-06-21 DIAGNOSIS — I083 Combined rheumatic disorders of mitral, aortic and tricuspid valves: Secondary | ICD-10-CM | POA: Insufficient documentation

## 2022-06-21 DIAGNOSIS — G40909 Epilepsy, unspecified, not intractable, without status epilepticus: Secondary | ICD-10-CM | POA: Diagnosis not present

## 2022-06-21 DIAGNOSIS — I5022 Chronic systolic (congestive) heart failure: Secondary | ICD-10-CM

## 2022-06-21 DIAGNOSIS — I272 Pulmonary hypertension, unspecified: Secondary | ICD-10-CM | POA: Diagnosis not present

## 2022-06-21 DIAGNOSIS — I4821 Permanent atrial fibrillation: Secondary | ICD-10-CM

## 2022-06-21 DIAGNOSIS — Z91148 Patient's other noncompliance with medication regimen for other reason: Secondary | ICD-10-CM | POA: Insufficient documentation

## 2022-06-21 DIAGNOSIS — Z79899 Other long term (current) drug therapy: Secondary | ICD-10-CM | POA: Diagnosis not present

## 2022-06-21 DIAGNOSIS — J4489 Other specified chronic obstructive pulmonary disease: Secondary | ICD-10-CM | POA: Diagnosis not present

## 2022-06-21 DIAGNOSIS — Z7901 Long term (current) use of anticoagulants: Secondary | ICD-10-CM | POA: Diagnosis not present

## 2022-06-21 DIAGNOSIS — I5042 Chronic combined systolic (congestive) and diastolic (congestive) heart failure: Secondary | ICD-10-CM

## 2022-06-21 DIAGNOSIS — G4733 Obstructive sleep apnea (adult) (pediatric): Secondary | ICD-10-CM

## 2022-06-21 DIAGNOSIS — J4540 Moderate persistent asthma, uncomplicated: Secondary | ICD-10-CM | POA: Insufficient documentation

## 2022-06-21 DIAGNOSIS — Z7984 Long term (current) use of oral hypoglycemic drugs: Secondary | ICD-10-CM | POA: Insufficient documentation

## 2022-06-21 DIAGNOSIS — E785 Hyperlipidemia, unspecified: Secondary | ICD-10-CM | POA: Diagnosis not present

## 2022-06-21 DIAGNOSIS — I428 Other cardiomyopathies: Secondary | ICD-10-CM | POA: Insufficient documentation

## 2022-06-21 DIAGNOSIS — I110 Hypertensive heart disease with heart failure: Secondary | ICD-10-CM | POA: Insufficient documentation

## 2022-06-21 DIAGNOSIS — Z95810 Presence of automatic (implantable) cardiac defibrillator: Secondary | ICD-10-CM | POA: Insufficient documentation

## 2022-06-21 LAB — BASIC METABOLIC PANEL
Anion gap: 11 (ref 5–15)
BUN: 22 mg/dL (ref 8–23)
CO2: 33 mmol/L — ABNORMAL HIGH (ref 22–32)
Calcium: 8.6 mg/dL — ABNORMAL LOW (ref 8.9–10.3)
Chloride: 95 mmol/L — ABNORMAL LOW (ref 98–111)
Creatinine, Ser: 1.26 mg/dL — ABNORMAL HIGH (ref 0.61–1.24)
GFR, Estimated: 58 mL/min — ABNORMAL LOW (ref >60)
Glucose, Bld: 94 mg/dL (ref 70–99)
Potassium: 3.6 mmol/L (ref 3.5–5.1)
Sodium: 139 mmol/L (ref 135–145)

## 2022-06-21 LAB — BRAIN NATRIURETIC PEPTIDE: B Natriuretic Peptide: 199.5 pg/mL — ABNORMAL HIGH (ref 0.0–100.0)

## 2022-06-21 LAB — ECHOCARDIOGRAM COMPLETE
Area-P 1/2: 2.8 cm2
Calc EF: 23.9 %
P 1/2 time: 550 msec
S' Lateral: 5.7 cm
Single Plane A2C EF: 25.1 %
Single Plane A4C EF: 23.6 %

## 2022-06-21 MED ORDER — PERFLUTREN LIPID MICROSPHERE
1.0000 mL | INTRAVENOUS | Status: DC | PRN
  Administered 2022-06-21: 5 mL via INTRAVENOUS

## 2022-06-21 NOTE — Patient Instructions (Signed)
There has been no changes to your medications.  Labs done today, your results will be available in MyChart, we will contact you for abnormal readings.  Your physician recommends that you schedule a follow-up appointment in: 6 months ( October ) ** please call the office in August to arrange your follow up appointment. **  If you have any questions or concerns before your next appointment please send [place] a message through [place] or call our office at [phone].    TO LEAVE A MESSAGE FOR THE NURSE SELECT OPTION 2, PLEASE LEAVE A MESSAGE INCLUDING: YOUR NAME DATE OF BIRTH CALL BACK NUMBER REASON FOR CALL**this is important as we prioritize the call backs  YOU WILL RECEIVE A CALL BACK THE SAME DAY AS LONG AS YOU CALL BEFORE 4:00 PM  At the [facility], you and your health needs are our priority. As part of our continuing mission to provide you with exceptional heart care, we have created designated Provider Care Teams. These Care Teams include your primary Cardiologist (physician) and Advanced Practice Providers (APPs- Physician Assistants and Nurse Practitioners) who all work together to provide you with the care you need, when you need it.   You may see any of the following providers on your designated Care Team at your next follow up: Dr Arvilla Meres Dr Marca Ancona Dr. Marcos Eke, NP Robbie Lis, [place] [facility] [place] [place], [place] Brynda Peon, NP Karle Plumber, PharmD   Please be sure to bring in all your medications bottles to every appointment.    Thank you for choosing [facility]

## 2022-06-21 NOTE — Progress Notes (Signed)
ReDS Vest / Clip - 06/21/22 1500       ReDS Vest / Clip   Station Marker D    Ruler Value 38    ReDS Value Range Low volume    ReDS Actual Value 25

## 2022-06-21 NOTE — Progress Notes (Signed)
Paramedicine Encounter  Patient ID: Ruben Murray, male, DOB: 1946-01-30, 77 y.o.,  MRN: [id number]  Met patient in clinic today with provider. [name] reports feeling okay per his usual. Short of breath on exertion when walking, unsteady without using a walker or cane, no swelling or major weight gain, he was med compliant over the last week.   Weight @ clinic-245lbs  Weight @ home- 240lbs  B/P-108/76  P-82 SP02-97% REDS CLIP-25%  Med changes: no med changes   Pill box filled for one week.  Refills called into [facility]- Phenobarbital.   Labs taken today. If any changes I will follow up with a visit. Other wise I willl see [name] in one week.    Maralyn Sago, EMT-Paramedic [phone] 06/21/2022

## 2022-06-21 NOTE — Progress Notes (Signed)
Echocardiogram 2D Echocardiogram has been performed.  Toni Amend 06/21/2022, 2:57 PM

## 2022-06-21 NOTE — Progress Notes (Signed)
Advanced Heart Failure Clinic Note   Primary Care: Aliene Beams, MD HF Cardiologist: Dr. Gala Romney EP: Dr Elberta Fortis.   HPI: Mr Cooksey is a 77 y.o.with HFrEF d/t NICM, permanent A fib, pulmonary HTN,  OSA, seizure d/o, COPD and HTN   Echo 2018 EF 40-45%  Lost to cardiology f/u 2022.    Admitted  2/23 for ADHF in setting of rapid AF. Echo EF 20-25%, RV mildly reduced, RVSP 48 mmHg, mild MR/TR. He was diuresed w/ IV Lasix. Afib treated w/ rate control, w/ metoprolol and digoxin.    He was seen by Dr. Elberta Fortis . Plan is for CRT-D implant w/ plans to consider AV node ablation if further issues w/ rapid afib.    Had initial TOC visit 05/04/2021. HF meds adjusted, but he did not start them.    Admitted 3/23 with AF RVR and A/C HFrEF.  Prior to admit he never picked up digoxin, Toprol, or Jardiance. Diuresed with IV lasix.  Discharged on lopressor 100 bid, digoxin 0.25 , and torsemide 40 mg bid. Discharge weight 214 pounds.   Seen in TOC  again in 3/23, volume trending up. Torsemide increased and spiro started. Referred to paramedicine.  Readmitted 5/23 with AFib with RVR, HR 150's, INR 3.8. CTA chest with small PE w/ R heart strain, also with volume overload. Echo with EF 20%. No DOAC due to hx of seizures and phenobarbital use.  AFib controlled with metoprolol 100 bid and digoxin (dig previously stopped due to elevated level). PMT consulted for GOC, and he remained DNR. He was discharged home with HH, weight 228 lbs.  S/P Abbott BiV ICDm12/23 (AV node not ablated).   Today he returns for HF follow up. Overall feeling fine. SOB with exertion. Denies PND/Orthopnea. Appetite ok. No fever or chills. Weight at home 235-240  pounds. Taking all medications. He has neighbors to help him and take him to appointments.   Cardiac Testing  - Echo (5/23): EF 20%, RV moderately down, moderate to severe TR, mild MR (he was in AF and volume overloaded).  - Echo (2/23): EF 20-25%, RV mildly reduced, RVSP  48, mild MR, mild TR   - L/RHC (9/21): Normal coronaries, mild pulmonary hypertension; PA 34/22, mean PA pressure 28 mmHg   - Echo (8/21): EF 20-25%, RV moderately reduced, RVSP 38, Mild MR, mild TR  - Echo (1/20): EF 40-45%, RV mildly dilated, systolic fx normal   - Echo (8/19): EF 25%, RV moderately reduced (in Afib)  - Echo (2/18): EF 40-45%, RV normal, mod TR  Past Medical History:  Diagnosis Date   Acquired thrombophilia 01/15/2019   Aortic atherosclerosis    Aortic root dilatation    CAP (community acquired pneumonia) 03/06/2016   Chronic laryngitis 10/03/2016   Chronic systolic CHF (congestive heart failure)    COPD GOLD II  08/02/2016   Quit smoking 2000  PFT's  07/10/2016  FEV1 1.98 (70 % ) ratio 67  p 19 % improvement from saba p nothing  prior to study while of coreg so rec as of 08/02/2016 try off coreg and on bisoprolol      Essential hypertension 03/06/2016   Changed from coreg to bisoprolol due to copd with reversible component  08/02/2016 >>>    Hoarseness 08/31/2016   Referred to ent 08/31/2016 >>> seen 10/03/16 Jenne Pane dx gerd and rhinitis medicamentosa   > improved on f/u 01/31/17 on gerd rx/ flonase and off afrin   Hyperlipidemia    Hypertension  Laryngopharyngeal reflux (LPR) 10/03/2016   Mitral regurgitation    Moderate persistent asthma 08/02/2016   Morbid (severe) obesity due to excess calories 08/02/2016   Nasal polyps 10/03/2016   NICM (nonischemic cardiomyopathy)    a. EF 40-45% in 04/2016, etiology not defined, managed medically.   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     busPIRone (BUSPAR) 5 MG tablet Take 5 mg by mouth 2 (two) times daily.     Carboxymethylcellulose Sod PF 0.5 % SOLN Place 1 drop into both eyes daily as needed (dry eyes).     Cholecalciferol (VITAMIN D3) 25 MCG (1000 UT) CAPS Take 1,000 Units by mouth daily.     escitalopram (LEXAPRO) 20 MG tablet Take 20 mg by mouth at bedtime.     finasteride (PROSCAR) 5 MG tablet TAKE 1 TABLET (5 MG TOTAL) BY MOUTH DAILY AT 12 NOON. 90 tablet 1   fluticasone (FLONASE) 50 MCG/ACT nasal spray Place 1 spray into both nostrils 2 (two) times daily.     JARDIANCE 10 MG TABS tablet TAKE 1 TABLET BY MOUTH DAILY BEFORE BREAKFAST. 90 tablet 3   loratadine (CLARITIN) 10 MG tablet Take 10 mg by mouth every morning.     losartan (COZAAR) 25 MG tablet TAKE 1/2 TABLET BY MOUTH EVERY DAY 45 tablet 3   metoprolol succinate (TOPROL-XL) 100 MG 24 hr tablet Take 1 tablet (100 mg total) by mouth 2 (two) times daily. Take with or immediately following a meal. 180 tablet 3   Multiple Vitamin (MULTIVITAMIN WITH MINERALS) TABS tablet Take 1 tablet by mouth every morning.     PHENobarbital (LUMINAL) 97.2 MG tablet Take 97.2 mg by mouth every evening. Takes between 3:00-5:00pm.     potassium chloride SA (KLOR-CON M) 20 MEQ tablet Take 1 tablet (20 mEq total) by mouth daily. 90 tablet 3   pravastatin (PRAVACHOL) 40 MG tablet Take 40 mg by mouth at bedtime.      spironolactone (ALDACTONE) 25 MG tablet Take 0.5 tablets (12.5 mg total) by mouth daily. 45 tablet 2   torsemide (DEMADEX) 20 MG tablet Take 3 tablets (60 mg total) by mouth daily. 90 tablet 2   triamcinolone cream (KENALOG) 0.1 % Apply 1 application  topically 2 (two) times daily as needed (rash).     warfarin (COUMADIN) 5 MG tablet Take 5 mg by mouth at bedtime.     No current facility-administered medications for this encounter.   Facility-Administered Medications Ordered in Other Encounters  Medication Dose Route Frequency  Provider Last Rate Last Admin   perflutren lipid microspheres (DEFINITY) IV suspension  1-10 mL Intravenous PRN Prince Rome M, FNP   5 mL at 06/21/22 1457   Allergies  Allergen Reactions   Dilaudid [Hydromorphone Hcl] Other (See Comments)    Makes pt hyper    Hydromorphone Other (See Comments)    hyperactiviity Other reaction(s): made him wild Other reaction(s): Unknown   Morphine Sulfate     Other reaction(s): at high dose causes confusion Other reaction(s): at high dose  causes confusion Other reaction(s): at high dose causes confusion, Other (See Comments) Other reaction(s): at high dose causes confusion Other reaction(s): at high dose causes confusion    Tegretol [Carbamazepine] Hives   Carbamazepine Hives, Rash and Other (See Comments)    Other reaction(s): hives Other reaction(s): Hives Other reaction(s): hives   Social History   Socioeconomic History   Marital status: Married    Spouse name: Not on file   Number of children: Not on file   Years of education: Not on file   Highest education level: Not on file  Occupational History   Not on file  Tobacco Use   Smoking status: Former    Packs/day: 3.00    Years: 48.00    Additional pack years: 0.00    Total pack years: 144.00    Types: Cigarettes    Quit date: 2000    Years since quitting: 24.3   Smokeless tobacco: Never  Vaping Use   Vaping Use: Never used  Substance and Sexual Activity   Alcohol use: Never   Drug use: Never   Sexual activity: Never  Other Topics Concern   Not on file  Social History Narrative   ** Merged History Encounter **       Social Determinants of Health   Financial Resource Strain: Medium Risk (10/26/2021)   Overall Financial Resource Strain (CARDIA)    Difficulty of Paying Living Expenses: Somewhat hard  Food Insecurity: No Food Insecurity (02/04/2022)   Hunger Vital Sign    Worried About Running Out of Food in the Last Year: Never true    Ran Out of Food in the Last  Year: Never true  Transportation Needs: Unmet Transportation Needs (02/04/2022)   PRAPARE - Administrator, Civil Service (Medical): Yes    Lack of Transportation (Non-Medical): No  Physical Activity: Inactive (12/15/2019)   Exercise Vital Sign    Days of Exercise per Week: 0 days    Minutes of Exercise per Session: 0 min  Stress: Stress Concern Present (09/26/2018)   [employer] of Occupational Health - Occupational Stress Questionnaire    Feeling of Stress : To some extent  Social Connections: Not on file  Intimate Partner Violence: Not At Risk (02/04/2022)   Humiliation, Afraid, Rape, and Kick questionnaire    Fear of Current or Ex-Partner: No    Emotionally Abused: No    Physically Abused: No    Sexually Abused: No   Family History  Problem Relation Age of Onset   Hypertension Mother    Other Father        sudden cardiac arrest   BP 90/60   Pulse 82   Wt 111.1 kg (245 lb)   SpO2 97%   BMI 37.25 kg/m   Wt Readings from Last 3 Encounters:  06/21/22 111.1 kg (245 lb)  06/14/22 108 kg (238 lb)  06/07/22 110.2 kg (243 lb)   PHYSICAL EXAM: General:  Well appearing. No resp difficulty HEENT: normal Neck: supple. no JVD. Carotids 2+ bilat; no bruits. No lymphadenopathy or thryomegaly appreciated. Cor: PMI nondisplaced. Regular rate & rhythm. No rubs, gallops or murmurs. Lungs: clear Abdomen: soft, nontender, nondistended. No hepatosplenomegaly. No bruits or masses. Good bowel sounds. Extremities: no cyanosis, clubbing, rash, edema Neuro: alert & orientedx3, cranial nerves grossly intact. moves all 4 extremities w/o difficulty. Affect pleasant  ASSESSMENT & PLAN: 1. Chronic Systolic Heart Failure - NICM. Dates back to at least 2018, EFs have fluctuated from 20-40% - most recent  echo 2/22 EF 20-25%, RV moderately reduced - LHC in 2021 showed normal coronaries - suspect tachy mediated CM from long history of chronic, poorly rate controlled Afib/flutter  -  He does not tolerate rapid Afib well, in setting of severe LV dysfunction  - S/P Abbott CRT-D  12/23 Echo 06/21/22 EF 20-25% - NYHA III  - Continue losartan 12.5 mg daily. Sherryll Burger cost-prohibitive) - Continue torsemide 60 mg daily. - Continue digoxin 0.125 mg daily. - Continue Jardiance 10 mg daily. - Continue Toprol XL 100 mg bid. - Continue spiro 12.5 mg daily.     2. Permanent Atrial Fibrillation  - failed multiple cardioversions and AAD therapy . - issues w/ Rapid Afib in the past, complicating HF - Rate control w/ metoprolol and digoxin.  - Followed by Dr. Elberta Fortis pending CRT-D. Can consdier AVN ablation if needed (CRT-D in place) - On chronic Coumadin, INRs managed by PCP.   3. PE, bilateral small sub-segmental - new on CTA 5/23 2/2 sub-therapeutic INR - No DOAC due to interaction with phenobarb. - On Coumadin.   4. Pulmonary Hypertension  - Suspect combination WHO Group 2 and 3 - PFTs in 2018 showed moderate OAD. Also w/ OSA    5. OSA  - No longer wearing CPAP, was told he didn't need it as he sleeps in recliner.   6. HTN Soft today.   7. COPD - Not on inhalers. - PFTs 2018 showed moderate obstructive disease. - Repeat PFTs  8. Medication non-compliance - Much improved - Continue paramedicine, appreciate their assistance.     Tonye Becket, NP  3:15 PM  Patient seen and examined with the above-signed Advanced Practice Provider and/or Housestaff. I personally reviewed laboratory data, imaging studies and relevant notes. I independently examined the patient and formulated the important aspects of the plan. I have edited the note to reflect any of my changes or salient points. I have personally discussed the plan with the patient and/or family.  He is here with Herbert Seta from [employer]. S/p CRT-D in 12/23. Says he is SOB with minimal activity. Uses walker around the house. Uses motorized scooter in store. Sleeping in hospitalized bed. Compliant with meds using a pill  box. But has poor memory. No CP or edema. BP 110-130s Personally reviewed  Echo today 06/21/22 EF 20-25% RV moderately reduced  General:  Elderly  No resp difficulty HEENT: normal Neck: supple. no JVD. Carotids 2+ bilat; no bruits. No lymphadenopathy or thryomegaly appreciated. Cor: PMI nondisplaced. Regular rate & rhythm. No rubs, gallops or murmurs. Lungs: clear Abdomen: soft, nontender, nondistended. No hepatosplenomegaly. No bruits or masses. Good bowel sounds. Extremities: no cyanosis, clubbing, rash, edema Neuro: alert & orientedx3, cranial nerves grossly intact. moves all 4 extremities w/o difficulty. Affect pleasant  Overall stable NYHA III-IIIB. Volume stable. AF rate controlled. EF remains 20-25% on echo today (reviewed personally)..Will follow adequacy of rate control on evice. Given that EF not improving would have low threshold for AV node if AF not well rate controlled. Appreciate Paramedicine support.   Arvilla Meres, MD  2:25 PM

## 2022-06-28 ENCOUNTER — Other Ambulatory Visit (HOSPITAL_COMMUNITY): Payer: Self-pay

## 2022-06-28 NOTE — Progress Notes (Signed)
Paramedicine Encounter    Patient ID: Ruben Murray, male    DOB: 29-Aug-1945, 77 y.o.   MRN: [id number]   Complaints- no complaints   Assessment- CAOx4, warm and dry, shortness of breath on exertion (normal for him), no swelling, lungs clear, vitals good- no weight gain. No pain at present., no dizziness.   Compliance with meds- missed one morning dose of meds.   Pill box filled- for one week.   Refills needed- Buspar   Meds changes since last visit- none     Social changes- none    BP 124/70   Pulse 78   Resp 18   Wt 238 lb 9.6 oz (108.2 kg)   SpO2 97%   BMI 36.28 kg/m  Weight yesterday-- 239lbs  Last visit weight--238lbs   Arrived for home visit for [facility] who reports to be feeling well today with no complaints. He has exertional shortness of breath noted while walking around his apartment- this is normal for him. He is using his rollator for getting around his home. One missed dose of medications this last week. I obtained vitals as noted. We reviewed meds and confirmed same- pill box filled for one week. Appointments reviewed and confirmed. Home visit completed. I will see Jihad in one week.   Maralyn Sago, EMT-Paramedic [phone]  ACTION: Home visit completed    Patient Care Team: Aliene Beams, MD as PCP - General (Family Medicine) Jake Bathe, MD as PCP - Cardiology (Cardiology) Regan Lemming, MD as PCP - Electrophysiology (Cardiology) Aliene Beams, MD (Family Medicine) [place] [place] [facility] as Case Manager (Hospice and Palliative Medicine)  Patient Active Problem List   Diagnosis Date Noted   CHF (congestive heart failure) 02/03/2022   Atrial fibrillation 11/02/2021   Long term (current) use of anticoagulants 11/02/2021   AKI (acute kidney injury) 07/27/2021   Acute pulmonary embolism 07/26/2021   Acute on chronic systolic CHF (congestive heart failure) 05/17/2021   Hypercoagulable state 05/10/2021   Left leg pain 04/24/2021    OSA on CPAP 04/24/2021   Mitral regurgitation 04/24/2021   Tricuspid regurgitation 04/24/2021   Dilated aortic root 04/24/2021   Permanent atrial fibrillation 04/19/2021   Chronic arthropathy 04/13/2020   Callus 04/13/2020   Onychomycosis 12/12/2019   Left leg cellulitis 12/12/2019   Chronic systolic heart failure 12/09/2019   Dilated cardiomyopathy    Atrial flutter 11/03/2019   Atrial flutter with rapid ventricular response 11/03/2019   Acquired thrombophilia 01/15/2019   Atrial fibrillation with rapid ventricular response 10/29/2017   Chronic combined systolic and diastolic heart failure 10/29/2017   Prolonged QT interval 10/29/2017   Sensorineural hearing loss (SNHL) of both ears 01/31/2017   Dysphonia 10/03/2016   Laryngopharyngeal reflux (LPR) 10/03/2016   Nasal polyps 10/03/2016   Rhinitis medicamentosa 10/03/2016   Chronic laryngitis 10/03/2016   Hoarseness 08/31/2016   Moderate persistent asthma 08/02/2016   COPD (chronic obstructive pulmonary disease) 08/02/2016   Morbid (severe) obesity due to excess calories 08/02/2016   Community acquired pneumonia 03/06/2016   Essential hypertension 03/06/2016   Mixed hyperlipidemia 03/06/2016   Right thyroid nodule 03/06/2016   Seizures 03/06/2016   Fever 03/12/2015   Recurrent erosion of cornea 06/23/2011    Current Outpatient Medications:    acetaminophen (TYLENOL) 500 MG tablet, Take 1,000 mg by mouth at bedtime., Disp: , Rfl:    busPIRone (BUSPAR) 5 MG tablet, Take 5 mg by mouth 2 (two) times daily., Disp: , Rfl:    Carboxymethylcellulose Sod PF 0.5 %  SOLN, Place 1 drop into both eyes daily as needed (dry eyes)., Disp: , Rfl:    Cholecalciferol (VITAMIN D3) 25 MCG (1000 UT) CAPS, Take 1,000 Units by mouth daily., Disp: , Rfl:    escitalopram (LEXAPRO) 20 MG tablet, Take 20 mg by mouth at bedtime., Disp: , Rfl:    finasteride (PROSCAR) 5 MG tablet, TAKE 1 TABLET (5 MG TOTAL) BY MOUTH DAILY AT 12 NOON., Disp: 90 tablet,  Rfl: 1   fluticasone (FLONASE) 50 MCG/ACT nasal spray, Place 1 spray into both nostrils 2 (two) times daily., Disp: , Rfl:    JARDIANCE 10 MG TABS tablet, TAKE 1 TABLET BY MOUTH DAILY BEFORE BREAKFAST., Disp: 90 tablet, Rfl: 3   loratadine (CLARITIN) 10 MG tablet, Take 10 mg by mouth every morning., Disp: , Rfl:    losartan (COZAAR) 25 MG tablet, TAKE 1/2 TABLET BY MOUTH EVERY DAY, Disp: 45 tablet, Rfl: 3   metoprolol succinate (TOPROL-XL) 100 MG 24 hr tablet, Take 1 tablet (100 mg total) by mouth 2 (two) times daily. Take with or immediately following a meal., Disp: 180 tablet, Rfl: 3   Multiple Vitamin (MULTIVITAMIN WITH MINERALS) TABS tablet, Take 1 tablet by mouth every morning., Disp: , Rfl:    PHENobarbital (LUMINAL) 97.2 MG tablet, Take 97.2 mg by mouth every evening. Takes between 3:00-5:00pm., Disp: , Rfl:    potassium chloride SA (KLOR-CON M) 20 MEQ tablet, Take 1 tablet (20 mEq total) by mouth daily., Disp: 90 tablet, Rfl: 3   pravastatin (PRAVACHOL) 40 MG tablet, Take 40 mg by mouth at bedtime. , Disp: , Rfl:    spironolactone (ALDACTONE) 25 MG tablet, Take 0.5 tablets (12.5 mg total) by mouth daily., Disp: 45 tablet, Rfl: 2   torsemide (DEMADEX) 20 MG tablet, Take 3 tablets (60 mg total) by mouth daily., Disp: 90 tablet, Rfl: 2   triamcinolone cream (KENALOG) 0.1 %, Apply 1 application  topically 2 (two) times daily as needed (rash)., Disp: , Rfl:    warfarin (COUMADIN) 5 MG tablet, Take 5 mg by mouth at bedtime., Disp: , Rfl:  Allergies  Allergen Reactions   Dilaudid [Hydromorphone Hcl] Other (See Comments)    Makes pt hyper    Hydromorphone Other (See Comments)    hyperactiviity Other reaction(s): made him wild Other reaction(s): Unknown   Morphine Sulfate     Other reaction(s): at high dose causes confusion Other reaction(s): at high dose causes confusion Other reaction(s): at high dose causes confusion, Other (See Comments) Other reaction(s): at high dose causes confusion  Other reaction(s): at high dose causes confusion    Tegretol [Carbamazepine] Hives   Carbamazepine Hives, Rash and Other (See Comments)    Other reaction(s): hives Other reaction(s): Hives Other reaction(s): hives     Social History   Socioeconomic History   Marital status: Married    Spouse name: Not on file   Number of children: Not on file   Years of education: Not on file   Highest education level: Not on file  Occupational History   Not on file  Tobacco Use   Smoking status: Former    Packs/day: 3.00    Years: 48.00    Additional pack years: 0.00    Total pack years: 144.00    Types: Cigarettes    Quit date: 2000    Years since quitting: 24.3   Smokeless tobacco: Never  Vaping Use   Vaping Use: Never used  Substance and Sexual Activity   Alcohol use: Never  Drug use: Never   Sexual activity: Never  Other Topics Concern   Not on file  Social History Narrative   ** Merged History Encounter **       Social Determinants of Health   Financial Resource Strain: Medium Risk (10/26/2021)   Overall Financial Resource Strain (CARDIA)    Difficulty of Paying Living Expenses: Somewhat hard  Food Insecurity: No Food Insecurity (02/04/2022)   Hunger Vital Sign    Worried About Running Out of Food in the Last Year: Never true    Ran Out of Food in the Last Year: Never true  Transportation Needs: Unmet Transportation Needs (02/04/2022)   PRAPARE - Administrator, Civil Service (Medical): Yes    Lack of Transportation (Non-Medical): No  Physical Activity: Inactive (12/15/2019)   Exercise Vital Sign    Days of Exercise per Week: 0 days    Minutes of Exercise per Session: 0 min  Stress: Stress Concern Present (09/26/2018)   [employer] of Occupational Health - Occupational Stress Questionnaire    Feeling of Stress : To some extent  Social Connections: Not on file  Intimate Partner Violence: Not At Risk (02/04/2022)   Humiliation, Afraid, Rape, and Kick  questionnaire    Fear of Current or Ex-Partner: No    Emotionally Abused: No    Physically Abused: No    Sexually Abused: No    Physical Exam      Future Appointments  Date Time Provider Department Center  07/11/2022  1:30 PM [facility] None  07/24/2022  7:25 AM [facility] DEVICE REMOTES [facility]  10/23/2022  7:25 AM [facility] DEVICE REMOTES [facility]  11/24/2022  3:00 PM Jake Bathe, MD [facility]  01/22/2023  7:25 AM [facility] DEVICE REMOTES [facility]  04/23/2023  7:25 AM [facility] DEVICE REMOTES [facility]  07/23/2023  7:25 AM [facility] DEVICE REMOTES [facility]

## 2022-06-28 NOTE — Progress Notes (Signed)
Remote ICD transmission.   

## 2022-07-04 DIAGNOSIS — I5022 Chronic systolic (congestive) heart failure: Secondary | ICD-10-CM | POA: Diagnosis not present

## 2022-07-04 DIAGNOSIS — I110 Hypertensive heart disease with heart failure: Secondary | ICD-10-CM | POA: Diagnosis not present

## 2022-07-04 DIAGNOSIS — D6869 Other thrombophilia: Secondary | ICD-10-CM | POA: Diagnosis not present

## 2022-07-04 DIAGNOSIS — I480 Paroxysmal atrial fibrillation: Secondary | ICD-10-CM | POA: Diagnosis not present

## 2022-07-04 DIAGNOSIS — G40909 Epilepsy, unspecified, not intractable, without status epilepticus: Secondary | ICD-10-CM | POA: Diagnosis not present

## 2022-07-04 DIAGNOSIS — D729 Disorder of white blood cells, unspecified: Secondary | ICD-10-CM | POA: Diagnosis not present

## 2022-07-04 DIAGNOSIS — H6123 Impacted cerumen, bilateral: Secondary | ICD-10-CM | POA: Diagnosis not present

## 2022-07-04 DIAGNOSIS — R7303 Prediabetes: Secondary | ICD-10-CM | POA: Diagnosis not present

## 2022-07-04 DIAGNOSIS — I10 Essential (primary) hypertension: Secondary | ICD-10-CM | POA: Diagnosis not present

## 2022-07-04 DIAGNOSIS — I77810 Thoracic aortic ectasia: Secondary | ICD-10-CM | POA: Diagnosis not present

## 2022-07-04 DIAGNOSIS — F339 Major depressive disorder, recurrent, unspecified: Secondary | ICD-10-CM | POA: Diagnosis not present

## 2022-07-04 DIAGNOSIS — E119 Type 2 diabetes mellitus without complications: Secondary | ICD-10-CM | POA: Diagnosis not present

## 2022-07-04 DIAGNOSIS — F419 Anxiety disorder, unspecified: Secondary | ICD-10-CM | POA: Diagnosis not present

## 2022-07-04 DIAGNOSIS — E118 Type 2 diabetes mellitus with unspecified complications: Secondary | ICD-10-CM | POA: Diagnosis not present

## 2022-07-05 ENCOUNTER — Other Ambulatory Visit (HOSPITAL_COMMUNITY): Payer: Self-pay

## 2022-07-05 NOTE — Progress Notes (Signed)
Paramedicine Encounter    Patient ID: Ruben Murray, male    DOB: 01-01-1946, 77 y.o.   MRN: [id number]   Complaints- pain and swelling in left big toe, some exertional shortness of breath   Assessment- CAOx4, warm and dry seated in chair in living room.   Compliance with meds- no missed doses over the last week   Pill box filled- for one week   Refills needed- Buspar, Proscar, Warfarin   Meds changes since last visit- none     Social changes- none   Arrived for home visit for Ruben Murray who reports to be feeling okay today other than exertional shortness of breath which is normal for him, and some pain and swelling with redness to the left big toe- he is afraid this is a gout flare up. He admits to eating a lot of pork and beef. I advised him to try to reduce the amount of this he eats to reduce his chances of a gout flare up. I will report this to his PCP.  I spoke to PCP's RN Ruben Murray and she reports she is sending a message to the doctor. I will follow up. No edema noted in lower legs, lungs clear. I obtained vitals and assessment as noted. I reviewed meds and confirmed same- I filled pill box for one week. Refills as noted.  I reviewed upcoming appointments and wrote them down for same.  Home visit complete.   BP 124/72   Pulse 77   Resp 16   Wt 241 lb (109.3 kg)   SpO2 96%   BMI 36.64 kg/m  Weight yesterday-- 242lbs Last visit weight--238lbs     Ruben Murray, EMT-Paramedic [phone]  ACTION: Home visit completed    Patient Care Team: Ruben Beams, MD as PCP - General (Family Medicine) Ruben Bathe, MD as PCP - Cardiology (Cardiology) Ruben Lemming, MD as PCP - Electrophysiology (Cardiology) Ruben Beams, MD (Family Medicine) Ruben [facility], [facility] as Case Manager (Hospice and Palliative Medicine)  Patient Active Problem List   Diagnosis Date Noted   CHF (congestive heart failure) (HCC) 02/03/2022   Atrial fibrillation (HCC) 11/02/2021    Long term (current) use of anticoagulants 11/02/2021   AKI (acute kidney injury) (HCC) 07/27/2021   Acute pulmonary embolism (HCC) 07/26/2021   Acute on chronic systolic CHF (congestive heart failure) (HCC) 05/17/2021   Hypercoagulable state (HCC) 05/10/2021   Left leg pain 04/24/2021   OSA on CPAP 04/24/2021   Mitral regurgitation 04/24/2021   Tricuspid regurgitation 04/24/2021   Dilated aortic root (HCC) 04/24/2021   Permanent atrial fibrillation (HCC) 04/19/2021   Chronic arthropathy 04/13/2020   Callus 04/13/2020   Onychomycosis 12/12/2019   Left leg cellulitis 12/12/2019   Chronic systolic heart failure (HCC) 12/09/2019   Dilated cardiomyopathy (HCC)    Atrial flutter (HCC) 11/03/2019   Atrial flutter with rapid ventricular response (HCC) 11/03/2019   Acquired thrombophilia (HCC) 01/15/2019   Atrial fibrillation with rapid ventricular response (HCC) 10/29/2017   Chronic combined systolic and diastolic heart failure (HCC) 10/29/2017   Prolonged QT interval 10/29/2017   Sensorineural hearing loss (SNHL) of both ears 01/31/2017   Dysphonia 10/03/2016   Laryngopharyngeal reflux (LPR) 10/03/2016   Nasal polyps 10/03/2016   Rhinitis medicamentosa 10/03/2016   Chronic laryngitis 10/03/2016   Hoarseness 08/31/2016   Moderate persistent asthma 08/02/2016   COPD (chronic obstructive pulmonary disease) (HCC) 08/02/2016   Morbid (severe) obesity due to excess calories (HCC) 08/02/2016   Community acquired pneumonia 03/06/2016  Essential hypertension 03/06/2016   Mixed hyperlipidemia 03/06/2016   Right thyroid nodule 03/06/2016   Seizures (HCC) 03/06/2016   Fever 03/12/2015   Recurrent erosion of cornea 06/23/2011    Current Outpatient Medications:    acetaminophen (TYLENOL) 500 MG tablet, Take 1,000 mg by mouth at bedtime., Disp: , Rfl:    busPIRone (BUSPAR) 5 MG tablet, Take 5 mg by mouth 2 (two) times daily., Disp: , Rfl:    Carboxymethylcellulose Sod PF 0.5 % SOLN, Place 1  drop into both eyes daily as needed (dry eyes)., Disp: , Rfl:    Cholecalciferol (VITAMIN D3) 25 MCG (1000 UT) CAPS, Take 1,000 Units by mouth daily., Disp: , Rfl:    escitalopram (LEXAPRO) 20 MG tablet, Take 20 mg by mouth at bedtime., Disp: , Rfl:    finasteride (PROSCAR) 5 MG tablet, TAKE 1 TABLET (5 MG TOTAL) BY MOUTH DAILY AT 12 NOON., Disp: 90 tablet, Rfl: 1   fluticasone (FLONASE) 50 MCG/ACT nasal spray, Place 1 spray into both nostrils 2 (two) times daily., Disp: , Rfl:    JARDIANCE 10 MG TABS tablet, TAKE 1 TABLET BY MOUTH DAILY BEFORE BREAKFAST., Disp: 90 tablet, Rfl: 3   loratadine (CLARITIN) 10 MG tablet, Take 10 mg by mouth every morning., Disp: , Rfl:    losartan (COZAAR) 25 MG tablet, TAKE 1/2 TABLET BY MOUTH EVERY DAY, Disp: 45 tablet, Rfl: 3   metoprolol succinate (TOPROL-XL) 100 MG 24 hr tablet, Take 1 tablet (100 mg total) by mouth 2 (two) times daily. Take with or immediately following a meal., Disp: 180 tablet, Rfl: 3   Multiple Vitamin (MULTIVITAMIN WITH MINERALS) TABS tablet, Take 1 tablet by mouth every morning., Disp: , Rfl:    PHENobarbital (LUMINAL) 97.2 MG tablet, Take 97.2 mg by mouth every evening. Takes between 3:00-5:00pm., Disp: , Rfl:    potassium chloride SA (KLOR-CON M) 20 MEQ tablet, Take 1 tablet (20 mEq total) by mouth daily., Disp: 90 tablet, Rfl: 3   pravastatin (PRAVACHOL) 40 MG tablet, Take 40 mg by mouth at bedtime. , Disp: , Rfl:    spironolactone (ALDACTONE) 25 MG tablet, Take 0.5 tablets (12.5 mg total) by mouth daily., Disp: 45 tablet, Rfl: 2   torsemide (DEMADEX) 20 MG tablet, Take 3 tablets (60 mg total) by mouth daily., Disp: 90 tablet, Rfl: 2   triamcinolone cream (KENALOG) 0.1 %, Apply 1 application  topically 2 (two) times daily as needed (rash)., Disp: , Rfl:    warfarin (COUMADIN) 5 MG tablet, Take 5 mg by mouth at bedtime., Disp: , Rfl:  Allergies  Allergen Reactions   Dilaudid [Hydromorphone Hcl] Other (See Comments)    Makes pt hyper     Hydromorphone Other (See Comments)    hyperactiviity Other reaction(s): made him wild Other reaction(s): Unknown   Morphine Sulfate     Other reaction(s): at high dose causes confusion Other reaction(s): at high dose causes confusion Other reaction(s): at high dose causes confusion, Other (See Comments) Other reaction(s): at high dose causes confusion Other reaction(s): at high dose causes confusion    Tegretol [Carbamazepine] Hives   Carbamazepine Hives, Rash and Other (See Comments)    Other reaction(s): hives Other reaction(s): Hives Other reaction(s): hives     Social History   Socioeconomic History   Marital status: Married    Spouse name: Not on file   Number of children: Not on file   Years of education: Not on file   Highest education level: Not on file  Occupational History   Not on file  Tobacco Use   Smoking status: Former    Packs/day: 3.00    Years: 48.00    Additional pack years: 0.00    Total pack years: 144.00    Types: Cigarettes    Quit date: 2000    Years since quitting: 24.3   Smokeless tobacco: Never  Vaping Use   Vaping Use: Never used  Substance and Sexual Activity   Alcohol use: Never   Drug use: Never   Sexual activity: Never  Other Topics Concern   Not on file  Social History Narrative   ** Merged History Encounter **       Social Determinants of Health   Financial Resource Strain: Medium Risk (10/26/2021)   Overall Financial Resource Strain (CARDIA)    Difficulty of Paying Living Expenses: Somewhat hard  Food Insecurity: No Food Insecurity (02/04/2022)   Hunger Vital Sign    Worried About Running Out of Food in the Last Year: Never true    Ran Out of Food in the Last Year: Never true  Transportation Needs: Unmet Transportation Needs (02/04/2022)   PRAPARE - Administrator, Civil Service (Medical): Yes    Lack of Transportation (Non-Medical): No  Physical Activity: Inactive (12/15/2019)   Exercise Vital Sign    Days of  Exercise per Week: 0 days    Minutes of Exercise per Session: 0 min  Stress: Stress Concern Present (09/26/2018)   [employer] of Occupational Health - Occupational Stress Questionnaire    Feeling of Stress : To some extent  Social Connections: Not on file  Intimate Partner Violence: Not At Risk (02/04/2022)   Humiliation, Afraid, Rape, and Kick questionnaire    Fear of Current or Ex-Partner: No    Emotionally Abused: No    Physically Abused: No    Sexually Abused: No    Physical Exam      Future Appointments  Date Time Provider Department Center  07/11/2022  1:30 PM [facility] None  07/24/2022  7:25 AM [facility]  10/23/2022  7:25 AM [facility]  11/24/2022  3:00 PM Ruben Bathe, MD [facility]  01/22/2023  7:25 AM [facility]  04/23/2023  7:25 AM [facility]  07/23/2023  7:25 AM [facility] DEVICE [facility]ES [facility]

## 2022-07-07 ENCOUNTER — Other Ambulatory Visit (HOSPITAL_COMMUNITY): Payer: Self-pay | Admitting: Internal Medicine

## 2022-07-11 ENCOUNTER — Ambulatory Visit: Payer: No Typology Code available for payment source | Attending: Cardiology | Admitting: *Deleted

## 2022-07-11 DIAGNOSIS — I4891 Unspecified atrial fibrillation: Secondary | ICD-10-CM

## 2022-07-11 DIAGNOSIS — Z7901 Long term (current) use of anticoagulants: Secondary | ICD-10-CM | POA: Diagnosis not present

## 2022-07-11 DIAGNOSIS — I2699 Other pulmonary embolism without acute cor pulmonale: Secondary | ICD-10-CM | POA: Diagnosis not present

## 2022-07-11 LAB — POCT INR: INR: 2.5 (ref 2.0–3.0)

## 2022-07-11 NOTE — Patient Instructions (Signed)
Description   Continue taking warfarin 1 tablet daily except 1/2 tablet on Mondays and Fridays. Recheck INR in 5 weeks. [facility] [phone] or [phone]

## 2022-07-12 ENCOUNTER — Other Ambulatory Visit (HOSPITAL_COMMUNITY): Payer: Self-pay

## 2022-07-12 NOTE — Progress Notes (Signed)
Paramedicine Encounter    Patient ID: Ruben Murray, male    DOB: 09/06/1945, 77 y.o.   MRN: [id number]  Arrived for home visit for Ruben Murray who reports to be feeling well other than toe pain from gout- he was prescribed prednisone by PCP but has not started yet. I placed same in his pill box accordingly for this week. No complaints of chest pain, dizziness, shortness of breath more than normal. Weight is stable. I obtained vitals and assessment. Lungs clear, no lower leg edema. Vitals within normal for him. I reviewed meds and filled pill box for one week. Beatrix Shipper- [facility] will be coming out next week to see him for weekly paramedicine visit due to my absence. No refills needed. Appointments reviewed. Home visit complete.   Maralyn Sago, EMT-Paramedic [phone] 07/12/2022    Patient Care Team: Aliene Beams, MD as PCP - General (Family Medicine) Jake Bathe, MD as PCP - Cardiology (Cardiology) Regan Lemming, MD as PCP - Electrophysiology (Cardiology) Aliene Beams, MD (Family Medicine) [place], [facility] as Case Manager (Hospice and Palliative Medicine)  Patient Active Problem List   Diagnosis Date Noted   CHF (congestive heart failure) (HCC) 02/03/2022   Atrial fibrillation (HCC) 11/02/2021   Long term (current) use of anticoagulants 11/02/2021   AKI (acute kidney injury) (HCC) 07/27/2021   Acute pulmonary embolism (HCC) 07/26/2021   Acute on chronic systolic CHF (congestive heart failure) (HCC) 05/17/2021   Hypercoagulable state (HCC) 05/10/2021   Left leg pain 04/24/2021   OSA on CPAP 04/24/2021   Mitral regurgitation 04/24/2021   Tricuspid regurgitation 04/24/2021   Dilated aortic root (HCC) 04/24/2021   Permanent atrial fibrillation (HCC) 04/19/2021   Chronic arthropathy 04/13/2020   Callus 04/13/2020   Onychomycosis 12/12/2019   Left leg cellulitis 12/12/2019   Chronic systolic heart failure (HCC) 12/09/2019   Dilated cardiomyopathy (HCC)    Atrial  flutter (HCC) 11/03/2019   Atrial flutter with rapid ventricular response (HCC) 11/03/2019   Acquired thrombophilia (HCC) 01/15/2019   Atrial fibrillation with rapid ventricular response (HCC) 10/29/2017   Chronic combined systolic and diastolic heart failure (HCC) 10/29/2017   Prolonged QT interval 10/29/2017   Sensorineural hearing loss (SNHL) of both ears 01/31/2017   Dysphonia 10/03/2016   Laryngopharyngeal reflux (LPR) 10/03/2016   Nasal polyps 10/03/2016   Rhinitis medicamentosa 10/03/2016   Chronic laryngitis 10/03/2016   Hoarseness 08/31/2016   Moderate persistent asthma 08/02/2016   COPD (chronic obstructive pulmonary disease) (HCC) 08/02/2016   Morbid (severe) obesity due to excess calories (HCC) 08/02/2016   Community acquired pneumonia 03/06/2016   Essential hypertension 03/06/2016   Mixed hyperlipidemia 03/06/2016   Right thyroid nodule 03/06/2016   Seizures (HCC) 03/06/2016   Fever 03/12/2015   Recurrent erosion of cornea 06/23/2011    Current Outpatient Medications:    acetaminophen (TYLENOL) 500 MG tablet, Take 1,000 mg by mouth at bedtime., Disp: , Rfl:    busPIRone (BUSPAR) 5 MG tablet, Take 5 mg by mouth 2 (two) times daily., Disp: , Rfl:    Carboxymethylcellulose Sod PF 0.5 % SOLN, Place 1 drop into both eyes daily as needed (dry eyes)., Disp: , Rfl:    Cholecalciferol (VITAMIN D3) 25 MCG (1000 UT) CAPS, Take 1,000 Units by mouth daily., Disp: , Rfl:    escitalopram (LEXAPRO) 20 MG tablet, Take 20 mg by mouth at bedtime., Disp: , Rfl:    finasteride (PROSCAR) 5 MG tablet, TAKE 1 TABLET (5 MG TOTAL) BY MOUTH DAILY AT  12 NOON., Disp: 90 tablet, Rfl: 1   fluticasone (FLONASE) 50 MCG/ACT nasal spray, Place 1 spray into both nostrils 2 (two) times daily., Disp: , Rfl:    JARDIANCE 10 MG TABS tablet, TAKE 1 TABLET BY MOUTH DAILY BEFORE BREAKFAST., Disp: 90 tablet, Rfl: 3   loratadine (CLARITIN) 10 MG tablet, Take 10 mg by mouth every morning., Disp: , Rfl:    losartan  (COZAAR) 25 MG tablet, TAKE 1/2 TABLET BY MOUTH EVERY DAY, Disp: 45 tablet, Rfl: 3   metoprolol succinate (TOPROL-XL) 100 MG 24 hr tablet, Take 1 tablet (100 mg total) by mouth 2 (two) times daily. Take with or immediately following a meal., Disp: 180 tablet, Rfl: 3   Multiple Vitamin (MULTIVITAMIN WITH MINERALS) TABS tablet, Take 1 tablet by mouth every morning., Disp: , Rfl:    PHENobarbital (LUMINAL) 97.2 MG tablet, Take 97.2 mg by mouth every evening. Takes between 3:00-5:00pm., Disp: , Rfl:    potassium chloride SA (KLOR-CON M) 20 MEQ tablet, Take 1 tablet (20 mEq total) by mouth daily., Disp: 90 tablet, Rfl: 3   pravastatin (PRAVACHOL) 40 MG tablet, Take 40 mg by mouth at bedtime. , Disp: , Rfl:    spironolactone (ALDACTONE) 25 MG tablet, Take 0.5 tablets (12.5 mg total) by mouth daily., Disp: 45 tablet, Rfl: 2   torsemide (DEMADEX) 20 MG tablet, TAKE 3 TABLETS BY MOUTH EVERY DAY, Disp: 270 tablet, Rfl: 0   triamcinolone cream (KENALOG) 0.1 %, Apply 1 application  topically 2 (two) times daily as needed (rash)., Disp: , Rfl:    warfarin (COUMADIN) 5 MG tablet, Take 5 mg by mouth at bedtime., Disp: , Rfl:  Allergies  Allergen Reactions   Dilaudid [Hydromorphone Hcl] Other (See Comments)    Makes pt hyper    Hydromorphone Other (See Comments)    hyperactiviity Other reaction(s): made him wild Other reaction(s): Unknown   Morphine Sulfate     Other reaction(s): at high dose causes confusion Other reaction(s): at high dose causes confusion Other reaction(s): at high dose causes confusion, Other (See Comments) Other reaction(s): at high dose causes confusion Other reaction(s): at high dose causes confusion    Tegretol [Carbamazepine] Hives   Carbamazepine Hives, Rash and Other (See Comments)    Other reaction(s): hives Other reaction(s): Hives Other reaction(s): hives     Social History   Socioeconomic History   Marital status: Married    Spouse name: Not on file   Number of  children: Not on file   Years of education: Not on file   Highest education level: Not on file  Occupational History   Not on file  Tobacco Use   Smoking status: Former    Packs/day: 3.00    Years: 48.00    Additional pack years: 0.00    Total pack years: 144.00    Types: Cigarettes    Quit date: 2000    Years since quitting: 24.3   Smokeless tobacco: Never  Vaping Use   Vaping Use: Never used  Substance and Sexual Activity   Alcohol use: Never   Drug use: Never   Sexual activity: Never  Other Topics Concern   Not on file  Social History Narrative   ** Merged History Encounter **       Social Determinants of Health   Financial Resource Strain: Medium Risk (10/26/2021)   Overall Financial Resource Strain (CARDIA)    Difficulty of Paying Living Expenses: Somewhat hard  Food Insecurity: No Food Insecurity (02/04/2022)  Hunger Vital Sign    Worried About Running Out of Food in the Last Year: Never true    Ran Out of Food in the Last Year: Never true  Transportation Needs: Unmet Transportation Needs (02/04/2022)   PRAPARE - Administrator, Civil Service (Medical): Yes    Lack of Transportation (Non-Medical): No  Physical Activity: Inactive (12/15/2019)   Exercise Vital Sign    Days of Exercise per Week: 0 days    Minutes of Exercise per Session: 0 min  Stress: Stress Concern Present (09/26/2018)   [employer] of Occupational Health - Occupational Stress Questionnaire    Feeling of Stress : To some extent  Social Connections: Not on file  Intimate Partner Violence: Not At Risk (02/04/2022)   Humiliation, Afraid, Rape, and Kick questionnaire    Fear of Current or Ex-Partner: No    Emotionally Abused: No    Physically Abused: No    Sexually Abused: No    Physical Exam      Future Appointments  Date Time Provider Department Center  08/15/2022  1:30 PM [facility] None  08/25/2022  7:00 AM [facility]OT[facility]  [facility]  11/24/2022  7:00 AM [facility]  11/24/2022  3:00 PM Jake Bathe, MD [facility]  02/23/2023  7:00 AM [facility]  05/25/2023  7:00 AM [facility]  08/24/2023  7:00 AM [facility]     ACTION: Home visit completed

## 2022-07-19 ENCOUNTER — Other Ambulatory Visit (HOSPITAL_COMMUNITY): Payer: Self-pay | Admitting: Emergency Medicine

## 2022-07-19 NOTE — Progress Notes (Signed)
Paramedicine Encounter    Patient ID: Ruben Murray, male    DOB: 08/28/1945, 77 y.o.   MRN: [id number]   Complaints none  Assessment A&O x 4, skin W&D w/ good color.  Lung sounds clear throughout and no peripheral edema noted.  Compliance with meds missed 1 Buspirone and 1 Losartan 1/2 tab.   Pill box filled x 1 week  Refills needed Phenobarbitol  Meds changes since last visit none    Social changes None   There were no vitals taken for this visit. Weight yesterday- not taken Last visit weight-241lb  ACTION: Home visit completed  Bethanie Dicker [phone] 07/19/22  Patient Care Team: Aliene Beams, MD as PCP - General (Family Medicine) Jake Bathe, MD as PCP - Cardiology (Cardiology) Regan Lemming, MD as PCP - Electrophysiology (Cardiology) Aliene Beams, MD (Family Medicine) [place] as Case Manager [facility] and Palliative Medicine)  Patient Active Problem List   Diagnosis Date Noted  . CHF (congestive heart failure) (HCC) 02/03/2022  . Atrial fibrillation (HCC) 11/02/2021  . Long term (current) use of anticoagulants 11/02/2021  . AKI (acute kidney injury) (HCC) 07/27/2021  . Acute pulmonary embolism (HCC) 07/26/2021  . Acute on chronic systolic CHF (congestive heart failure) (HCC) 05/17/2021  . Hypercoagulable state (HCC) 05/10/2021  . Left leg pain 04/24/2021  . OSA on CPAP 04/24/2021  . Mitral regurgitation 04/24/2021  . Tricuspid regurgitation 04/24/2021  . Dilated aortic root (HCC) 04/24/2021  . Permanent atrial fibrillation (HCC) 04/19/2021  . Chronic arthropathy 04/13/2020  . Callus 04/13/2020  . Onychomycosis 12/12/2019  . Left leg cellulitis 12/12/2019  . Chronic systolic heart failure (HCC) 12/09/2019  . Dilated cardiomyopathy (HCC)   . Atrial flutter (HCC) 11/03/2019  . Atrial flutter with rapid ventricular response (HCC) 11/03/2019  . Acquired thrombophilia (HCC) 01/15/2019  . Atrial fibrillation  with rapid ventricular response (HCC) 10/29/2017  . Chronic combined systolic and diastolic heart failure (HCC) 10/29/2017  . Prolonged QT interval 10/29/2017  . Sensorineural hearing loss (SNHL) of both ears 01/31/2017  . Dysphonia 10/03/2016  . Laryngopharyngeal reflux (LPR) 10/03/2016  . Nasal polyps 10/03/2016  . Rhinitis medicamentosa 10/03/2016  . Chronic laryngitis 10/03/2016  . Hoarseness 08/31/2016  . Moderate persistent asthma 08/02/2016  . COPD (chronic obstructive pulmonary disease) (HCC) 08/02/2016  . Morbid (severe) obesity due to excess calories (HCC) 08/02/2016  . Community acquired pneumonia 03/06/2016  . Essential hypertension 03/06/2016  . Mixed hyperlipidemia 03/06/2016  . Right thyroid nodule 03/06/2016  . Seizures (HCC) 03/06/2016  . Fever 03/12/2015  . Recurrent erosion of cornea 06/23/2011    Current Outpatient Medications:  .  acetaminophen (TYLENOL) 500 MG tablet, Take 1,000 mg by mouth at bedtime., Disp: , Rfl:  .  busPIRone (BUSPAR) 5 MG tablet, Take 5 mg by mouth 2 (two) times daily., Disp: , Rfl:  .  Carboxymethylcellulose Sod PF 0.5 % SOLN, Place 1 drop into both eyes daily as needed (dry eyes)., Disp: , Rfl:  .  Cholecalciferol (VITAMIN D3) 25 MCG (1000 UT) CAPS, Take 1,000 Units by mouth daily., Disp: , Rfl:  .  escitalopram (LEXAPRO) 20 MG tablet, Take 20 mg by mouth at bedtime., Disp: , Rfl:  .  finasteride (PROSCAR) 5 MG tablet, TAKE 1 TABLET (5 MG TOTAL) BY MOUTH DAILY AT 12 NOON., Disp: 90 tablet, Rfl: 1 .  fluticasone (FLONASE) 50 MCG/ACT nasal spray, Place 1 spray into both nostrils 2 (two) times daily., Disp: , Rfl:  .  JARDIANCE 10 MG TABS tablet, TAKE 1 TABLET BY MOUTH DAILY BEFORE BREAKFAST., Disp: 90 tablet, Rfl: 3 .  loratadine (CLARITIN) 10 MG tablet, Take 10 mg by mouth every morning., Disp: , Rfl:  .  losartan (COZAAR) 25 MG tablet, TAKE 1/2 TABLET BY MOUTH EVERY DAY, Disp: 45 tablet, Rfl: 3 .  metoprolol succinate (TOPROL-XL) 100 MG  24 hr tablet, Take 1 tablet (100 mg total) by mouth 2 (two) times daily. Take with or immediately following a meal., Disp: 180 tablet, Rfl: 3 .  Multiple Vitamin (MULTIVITAMIN WITH MINERALS) TABS tablet, Take 1 tablet by mouth every morning., Disp: , Rfl:  .  PHENobarbital (LUMINAL) 97.2 MG tablet, Take 97.2 mg by mouth every evening. Takes between 3:00-5:00pm., Disp: , Rfl:  .  potassium chloride SA (KLOR-CON M) 20 MEQ tablet, Take 1 tablet (20 mEq total) by mouth daily., Disp: 90 tablet, Rfl: 3 .  pravastatin (PRAVACHOL) 40 MG tablet, Take 40 mg by mouth at bedtime. , Disp: , Rfl:  .  spironolactone (ALDACTONE) 25 MG tablet, Take 0.5 tablets (12.5 mg total) by mouth daily., Disp: 45 tablet, Rfl: 2 .  torsemide (DEMADEX) 20 MG tablet, TAKE 3 TABLETS BY MOUTH EVERY DAY, Disp: 270 tablet, Rfl: 0 .  triamcinolone cream (KENALOG) 0.1 %, Apply 1 application  topically 2 (two) times daily as needed (rash)., Disp: , Rfl:  .  warfarin (COUMADIN) 5 MG tablet, Take 5 mg by mouth at bedtime., Disp: , Rfl:  Allergies  Allergen Reactions  . Dilaudid [Hydromorphone Hcl] Other (See Comments)    Makes pt hyper   . Hydromorphone Other (See Comments)    hyperactiviity Other reaction(s): made him wild Other reaction(s): Unknown  . Morphine Sulfate     Other reaction(s): at high dose causes confusion Other reaction(s): at high dose causes confusion Other reaction(s): at high dose causes confusion, Other (See Comments) Other reaction(s): at high dose causes confusion Other reaction(s): at high dose causes confusion   . Tegretol [Carbamazepine] Hives  . Carbamazepine Hives, Rash and Other (See Comments)    Other reaction(s): hives Other reaction(s): Hives Other reaction(s): hives     Social History   Socioeconomic History  . Marital status: Married    Spouse name: Not on file  . Number of children: Not on file  . Years of education: Not on file  . Highest education level: Not on file  Occupational  History  . Not on file  Tobacco Use  . Smoking status: Former    Packs/day: 3.00    Years: 48.00    Additional pack years: 0.00    Total pack years: 144.00    Types: Cigarettes    Quit date: 2000    Years since quitting: 24.3  . Smokeless tobacco: Never  Vaping Use  . Vaping Use: Never used  Substance and Sexual Activity  . Alcohol use: Never  . Drug use: Never  . Sexual activity: Never  Other Topics Concern  . Not on file  Social History Narrative   ** Merged History Encounter **       Social Determinants of Health   Financial Resource Strain: Medium Risk (10/26/2021)   Overall Financial Resource Strain (CARDIA)   . Difficulty of Paying Living Expenses: Somewhat hard  Food Insecurity: No Food Insecurity (02/04/2022)   Hunger Vital Sign   . Worried About Programme researcher, broadcasting/film/video in the Last Year: Never true   . Ran Out of Food in the Last Year: Never true  Transportation Needs: Unmet Transportation Needs (02/04/2022)   PRAPARE - Transportation   . Lack of Transportation (Medical): Yes   . Lack of Transportation (Non-Medical): No  Physical Activity: Inactive (12/15/2019)   Exercise Vital Sign   . Days of Exercise per Week: 0 days   . Minutes of Exercise per Session: 0 min  Stress: Stress Concern Present (09/26/2018)   [employer] of Occupational Health - Occupational Stress Questionnaire   . Feeling of Stress : To some extent  Social Connections: Not on file  Intimate Partner Violence: Not At Risk (02/04/2022)   Humiliation, Afraid, Rape, and Kick questionnaire   . Fear of Current or Ex-Partner: No   . Emotionally Abused: No   . Physically Abused: No   . Sexually Abused: No    Physical Exam      Future Appointments  Date Time Provider Department Center  08/15/2022  1:30 PM [facility] None  08/25/2022  7:00 AM [facility] DEVICE REMOTES [facility]  11/24/2022  7:00 AM [facility] DEVICE REMOTES [facility]   11/24/2022  3:00 PM Jake Bathe, MD [facility]  02/23/2023  7:00 AM [facility] DEVICE REMOTES [facility]  05/25/2023  7:00 AM [facility] DEVICE REMOTES [facility]  08/24/2023  7:00 AM [facility] DEVICE REMOTES [facility]

## 2022-07-20 DIAGNOSIS — I5042 Chronic combined systolic (congestive) and diastolic (congestive) heart failure: Secondary | ICD-10-CM | POA: Diagnosis not present

## 2022-07-21 DIAGNOSIS — H903 Sensorineural hearing loss, bilateral: Secondary | ICD-10-CM | POA: Diagnosis not present

## 2022-07-21 DIAGNOSIS — J339 Nasal polyp, unspecified: Secondary | ICD-10-CM | POA: Diagnosis not present

## 2022-07-21 DIAGNOSIS — J3489 Other specified disorders of nose and nasal sinuses: Secondary | ICD-10-CM | POA: Diagnosis not present

## 2022-07-26 ENCOUNTER — Other Ambulatory Visit (HOSPITAL_COMMUNITY): Payer: Self-pay

## 2022-07-26 NOTE — Progress Notes (Signed)
Paramedicine Encounter    Patient ID: Ruben Murray, male    DOB: [birth year], 77 y.o.   MRN: [id number]   Complaints- general aches and pains, exertional shortness of breath baseline for him   Assessment- CAOX4, warm and dry, short of breath on exertion which is baseline for him, no lower leg swelling, lungs clear. No recent falls. No current gout pain or swelling/redness.   Compliance with meds- no missed doses in the last week   Pill box filled- for one week.   Refills needed- phenobarbital   Meds changes since last visit- none     Social changes- none   Arrived for home visit for Ruben Murray who reports to be feeling fine with no complaints other than his chronic aches and pains and exertional shortness of breath. Vitals and assessment as noted. No lower leg swelling, lungs clear. Meds reviewed and confirmed. Pill box filled for one week. Refills as noted. Confirmed he should be picking his phenobarbital by end of the week. He agreed with this plan. Appointments confirmed and he agreed to visit in one week. Home visit complete.    BP 112/64   Pulse 66   Resp 18   Wt 243 lb (110.2 kg)   SpO2 99%   BMI 36.95 kg/m  Weight yesterday-- 243lbs  Last visit weight-244lbs     Maralyn Sago, EMT-Paramedic [phone]  ACTION: Home visit completed    Patient Care Team: Aliene Beams, MD as PCP - General (Family Medicine) Jake Bathe, MD as PCP - Cardiology (Cardiology) Regan Lemming, MD as PCP - Electrophysiology (Cardiology) Aliene Beams, MD (Family Medicine) [place] as Case Manager (Hospice and Palliative Medicine)  Patient Active Problem List   Diagnosis Date Noted   CHF (congestive heart failure) (HCC) 02/03/2022   Atrial fibrillation (HCC) 11/02/2021   Long term (current) use of anticoagulants 11/02/2021   AKI (acute kidney injury) (HCC) 07/27/2021   Acute pulmonary embolism (HCC) 07/26/2021   Acute on chronic systolic CHF  (congestive heart failure) (HCC) 05/17/2021   Hypercoagulable state (HCC) 05/10/2021   Left leg pain 04/24/2021   OSA on CPAP 04/24/2021   Mitral regurgitation 04/24/2021   Tricuspid regurgitation 04/24/2021   Dilated aortic root (HCC) 04/24/2021   Permanent atrial fibrillation (HCC) 04/19/2021   Chronic arthropathy 04/13/2020   Callus 04/13/2020   Onychomycosis 12/12/2019   Left leg cellulitis 12/12/2019   Chronic systolic heart failure (HCC) 12/09/2019   Dilated cardiomyopathy (HCC)    Atrial flutter (HCC) 11/03/2019   Atrial flutter with rapid ventricular response (HCC) 11/03/2019   Acquired thrombophilia (HCC) 01/15/2019   Atrial fibrillation with rapid ventricular response (HCC) 10/29/2017   Chronic combined systolic and diastolic heart failure (HCC) 10/29/2017   Prolonged QT interval 10/29/2017   Sensorineural hearing loss (SNHL) of both ears 01/31/2017   Dysphonia 10/03/2016   Laryngopharyngeal reflux (LPR) 10/03/2016   Nasal polyps 10/03/2016   Rhinitis medicamentosa 10/03/2016   Chronic laryngitis 10/03/2016   Hoarseness 08/31/2016   Moderate persistent asthma 08/02/2016   COPD (chronic obstructive pulmonary disease) (HCC) 08/02/2016   Morbid (severe) obesity due to excess calories (HCC) 08/02/2016   Community acquired pneumonia 03/06/2016   Essential hypertension 03/06/2016   Mixed hyperlipidemia 03/06/2016   Right thyroid nodule 03/06/2016   Seizures (HCC) 03/06/2016   Fever 03/12/2015   Recurrent erosion of cornea 06/23/2011    Current Outpatient Medications:    acetaminophen (TYLENOL) 500 MG tablet, Take 1,000 mg by mouth at bedtime.,  Disp: , Rfl:    busPIRone (BUSPAR) 5 MG tablet, Take 5 mg by mouth 2 (two) times daily., Disp: , Rfl:    Carboxymethylcellulose Sod PF 0.5 % SOLN, Place 1 drop into both eyes daily as needed (dry eyes)., Disp: , Rfl:    Cholecalciferol (VITAMIN D3) 25 MCG (1000 UT) CAPS, Take 1,000 Units by mouth daily., Disp: , Rfl:     escitalopram (LEXAPRO) 20 MG tablet, Take 20 mg by mouth at bedtime., Disp: , Rfl:    finasteride (PROSCAR) 5 MG tablet, TAKE 1 TABLET (5 MG TOTAL) BY MOUTH DAILY AT 12 NOON., Disp: 90 tablet, Rfl: 1   fluticasone (FLONASE) 50 MCG/ACT nasal spray, Place 1 spray into both nostrils 2 (two) times daily., Disp: , Rfl:    JARDIANCE 10 MG TABS tablet, TAKE 1 TABLET BY MOUTH DAILY BEFORE BREAKFAST., Disp: 90 tablet, Rfl: 3   loratadine (CLARITIN) 10 MG tablet, Take 10 mg by mouth every morning., Disp: , Rfl:    losartan (COZAAR) 25 MG tablet, TAKE 1/2 TABLET BY MOUTH EVERY DAY, Disp: 45 tablet, Rfl: 3   metoprolol succinate (TOPROL-XL) 100 MG 24 hr tablet, Take 1 tablet (100 mg total) by mouth 2 (two) times daily. Take with or immediately following a meal., Disp: 180 tablet, Rfl: 3   Multiple Vitamin (MULTIVITAMIN WITH MINERALS) TABS tablet, Take 1 tablet by mouth every morning., Disp: , Rfl:    PHENobarbital (LUMINAL) 97.2 MG tablet, Take 97.2 mg by mouth every evening. Takes between 3:00-5:00pm., Disp: , Rfl:    potassium chloride SA (KLOR-CON M) 20 MEQ tablet, Take 1 tablet (20 mEq total) by mouth daily., Disp: 90 tablet, Rfl: 3   pravastatin (PRAVACHOL) 40 MG tablet, Take 40 mg by mouth at bedtime. , Disp: , Rfl:    spironolactone (ALDACTONE) 25 MG tablet, Take 0.5 tablets (12.5 mg total) by mouth daily., Disp: 45 tablet, Rfl: 2   torsemide (DEMADEX) 20 MG tablet, TAKE 3 TABLETS BY MOUTH EVERY DAY, Disp: 270 tablet, Rfl: 0   triamcinolone cream (KENALOG) 0.1 %, Apply 1 application  topically 2 (two) times daily as needed (rash)., Disp: , Rfl:    warfarin (COUMADIN) 5 MG tablet, Take 5 mg by mouth at bedtime., Disp: , Rfl:  Allergies  Allergen Reactions   Dilaudid [Hydromorphone Hcl] Other (See Comments)    Makes pt hyper    Hydromorphone Other (See Comments)    hyperactiviity Other reaction(s): made him wild Other reaction(s): Unknown   Morphine Sulfate     Other reaction(s): at high dose causes  confusion Other reaction(s): at high dose causes confusion Other reaction(s): at high dose causes confusion, Other (See Comments) Other reaction(s): at high dose causes confusion Other reaction(s): at high dose causes confusion    Tegretol [Carbamazepine] Hives   Carbamazepine Hives, Rash and Other (See Comments)    Other reaction(s): hives Other reaction(s): Hives Other reaction(s): hives     Social History   Socioeconomic History   Marital status: Married    Spouse name: Not on file   Number of children: Not on file   Years of education: Not on file   Highest education level: Not on file  Occupational History   Not on file  Tobacco Use   Smoking status: Former    Packs/day: 3.00    Years: 48.00    Additional pack years: 0.00    Total pack years: 144.00    Types: Cigarettes    Quit date: 2000  Years since quitting: 24.4   Smokeless tobacco: Never  Vaping Use   Vaping Use: Never used  Substance and Sexual Activity   Alcohol use: Never   Drug use: Never   Sexual activity: Never  Other Topics Concern   Not on file  Social History Narrative   ** Merged History Encounter **       Social Determinants of Health   Financial Resource Strain: Medium Risk (10/26/2021)   Overall Financial Resource Strain (CARDIA)    Difficulty of Paying Living Expenses: Somewhat hard  Food Insecurity: No Food Insecurity (02/04/2022)   Hunger Vital Sign    Worried About Running Out of Food in the Last Year: Never true    Ran Out of Food in the Last Year: Never true  Transportation Needs: Unmet Transportation Needs (02/04/2022)   PRAPARE - Administrator, Civil Service (Medical): Yes    Lack of Transportation (Non-Medical): No  Physical Activity: Inactive (12/15/2019)   Exercise Vital Sign    Days of Exercise per Week: 0 days    Minutes of Exercise per Session: 0 min  Stress: Stress Concern Present (09/26/2018)   [employer] of Occupational Health - Occupational Stress  Questionnaire    Feeling of Stress : To some extent  Social Connections: Not on file  Intimate Partner Violence: Not At Risk (02/04/2022)   Humiliation, Afraid, Rape, and Kick questionnaire    Fear of Current or Ex-Partner: No    Emotionally Abused: No    Physically Abused: No    Sexually Abused: No    Physical Exam      Future Appointments  Date Time Provider Department Center  08/15/2022  1:30 PM [facility] None  08/25/2022  7:00 AM [facility]  11/24/2022  7:00 AM [facility]  11/24/2022  3:00 PM Jake Bathe, MD [facility]  02/23/2023  7:00 AM [facility]  05/25/2023  7:00 AM [facility]  08/24/2023  7:00 AM [facility]ICE [facility]

## 2022-08-02 ENCOUNTER — Telehealth (HOSPITAL_COMMUNITY): Payer: Self-pay

## 2022-08-02 ENCOUNTER — Other Ambulatory Visit (HOSPITAL_COMMUNITY): Payer: Self-pay

## 2022-08-02 NOTE — Telephone Encounter (Signed)
Saw Mr. Wiebelhaus today and he is wondering if he should begin using his CPAP again- he states someone advised him to stop using it a few years ago. I will inquire. He may need to be re-referred to Sleep Medicine Cardiologist. I will send HF clinic a message about same.   Maralyn Sago, EMT-Paramedic [phone] 08/02/2022

## 2022-08-02 NOTE — Progress Notes (Signed)
Paramedicine Encounter    Patient ID: Ruben Murray, male    DOB: November 07, 1945, 77 y.o.   MRN: [id number]   Complaints- none   Assessment- CAOX4, warm and dry, seated in chair, no complaints of chest pain, shortness of breath, dizziness, swelling, weight gain. Lungs clear. No lower leg edema.   Compliance with meds- missed meds all day yesterday   Pill box filled- for one week   Refills needed- buspar   Meds changes since last visit- none     Social changes- none    BP 110/70   Pulse 88   Resp 16   Wt 242 lb (109.8 kg)   SpO2 97%   BMI 36.80 kg/m  Weight yesterday-- 246lbs  Last visit weight-- 243lbs   Arrived for home visit for Ruben Murray who reports to be feeling well with no complaints today. He denied shortness of breath, chest pain, dizziness, swelling or weight gain. He reports that he has had no episodes over the last week and that he has had no issues with his defibrillator. I obtained vitals and assessment. He noted to miss yesterdays meds morning, noon, evening and bedtime. He reports this was due to him sleeping most of the day. I reviewed meds and filled pill box for one week and confirmed box. Refills as noted. We confirmed upcoming appointments and reviewed HF education with diet and med compliance. He agreed. He is wondering if he should begin using his CPAP again- he states someone d/c'ed him from using it. I will inquire. He may need to be re-referred to Sleep Medicine Cardiologist. I will send HF clinic a message about same.  I plan to see Ruben Murray in one week. He agreed. Visit complete.   Maralyn Sago, EMT-Paramedic [phone]  ACTION: Home visit completed    Patient Care Team: Aliene Beams, MD as PCP - General (Family Medicine) Jake Bathe, MD as PCP - Cardiology (Cardiology) Regan Lemming, MD as PCP - Electrophysiology (Cardiology) Aliene Beams, MD (Family Medicine) [place] [facility] as Case Manager (Hospice and Palliative  Medicine)  Patient Active Problem List   Diagnosis Date Noted   CHF (congestive heart failure) (HCC) 02/03/2022   Atrial fibrillation (HCC) 11/02/2021   Long term (current) use of anticoagulants 11/02/2021   AKI (acute kidney injury) (HCC) 07/27/2021   Acute pulmonary embolism (HCC) 07/26/2021   Acute on chronic systolic CHF (congestive heart failure) (HCC) 05/17/2021   Hypercoagulable state (HCC) 05/10/2021   Left leg pain 04/24/2021   OSA on CPAP 04/24/2021   Mitral regurgitation 04/24/2021   Tricuspid regurgitation 04/24/2021   Dilated aortic root (HCC) 04/24/2021   Permanent atrial fibrillation (HCC) 04/19/2021   Chronic arthropathy 04/13/2020   Callus 04/13/2020   Onychomycosis 12/12/2019   Left leg cellulitis 12/12/2019   Chronic systolic heart failure (HCC) 12/09/2019   Dilated cardiomyopathy (HCC)    Atrial flutter (HCC) 11/03/2019   Atrial flutter with rapid ventricular response (HCC) 11/03/2019   Acquired thrombophilia (HCC) 01/15/2019   Atrial fibrillation with rapid ventricular response (HCC) 10/29/2017   Chronic combined systolic and diastolic heart failure (HCC) 10/29/2017   Prolonged QT interval 10/29/2017   Sensorineural hearing loss (SNHL) of both ears 01/31/2017   Dysphonia 10/03/2016   Laryngopharyngeal reflux (LPR) 10/03/2016   Nasal polyps 10/03/2016   Rhinitis medicamentosa 10/03/2016   Chronic laryngitis 10/03/2016   Hoarseness 08/31/2016   Moderate persistent asthma 08/02/2016   COPD (chronic obstructive pulmonary disease) (HCC) 08/02/2016   Morbid (severe)  obesity due to excess calories (HCC) 08/02/2016   Community acquired pneumonia 03/06/2016   Essential hypertension 03/06/2016   Mixed hyperlipidemia 03/06/2016   Right thyroid nodule 03/06/2016   Seizures (HCC) 03/06/2016   Fever 03/12/2015   Recurrent erosion of cornea 06/23/2011    Current Outpatient Medications:    acetaminophen (TYLENOL) 500 MG tablet, Take 1,000 mg by mouth at bedtime.,  Disp: , Rfl:    busPIRone (BUSPAR) 5 MG tablet, Take 5 mg by mouth 2 (two) times daily., Disp: , Rfl:    Carboxymethylcellulose Sod PF 0.5 % SOLN, Place 1 drop into both eyes daily as needed (dry eyes)., Disp: , Rfl:    Cholecalciferol (VITAMIN D3) 25 MCG (1000 UT) CAPS, Take 1,000 Units by mouth daily., Disp: , Rfl:    escitalopram (LEXAPRO) 20 MG tablet, Take 20 mg by mouth at bedtime., Disp: , Rfl:    finasteride (PROSCAR) 5 MG tablet, TAKE 1 TABLET (5 MG TOTAL) BY MOUTH DAILY AT 12 NOON., Disp: 90 tablet, Rfl: 1   fluticasone (FLONASE) 50 MCG/ACT nasal spray, Place 1 spray into both nostrils 2 (two) times daily., Disp: , Rfl:    JARDIANCE 10 MG TABS tablet, TAKE 1 TABLET BY MOUTH DAILY BEFORE BREAKFAST., Disp: 90 tablet, Rfl: 3   loratadine (CLARITIN) 10 MG tablet, Take 10 mg by mouth every morning., Disp: , Rfl:    losartan (COZAAR) 25 MG tablet, TAKE 1/2 TABLET BY MOUTH EVERY DAY, Disp: 45 tablet, Rfl: 3   metoprolol succinate (TOPROL-XL) 100 MG 24 hr tablet, Take 1 tablet (100 mg total) by mouth 2 (two) times daily. Take with or immediately following a meal., Disp: 180 tablet, Rfl: 3   Multiple Vitamin (MULTIVITAMIN WITH MINERALS) TABS tablet, Take 1 tablet by mouth every morning., Disp: , Rfl:    PHENobarbital (LUMINAL) 97.2 MG tablet, Take 97.2 mg by mouth every evening. Takes between 3:00-5:00pm., Disp: , Rfl:    potassium chloride SA (KLOR-CON M) 20 MEQ tablet, Take 1 tablet (20 mEq total) by mouth daily., Disp: 90 tablet, Rfl: 3   pravastatin (PRAVACHOL) 40 MG tablet, Take 40 mg by mouth at bedtime. , Disp: , Rfl:    spironolactone (ALDACTONE) 25 MG tablet, Take 0.5 tablets (12.5 mg total) by mouth daily., Disp: 45 tablet, Rfl: 2   torsemide (DEMADEX) 20 MG tablet, TAKE 3 TABLETS BY MOUTH EVERY DAY, Disp: 270 tablet, Rfl: 0   triamcinolone cream (KENALOG) 0.1 %, Apply 1 application  topically 2 (two) times daily as needed (rash)., Disp: , Rfl:    warfarin (COUMADIN) 5 MG tablet, Take 5  mg by mouth at bedtime., Disp: , Rfl:  Allergies  Allergen Reactions   Dilaudid [Hydromorphone Hcl] Other (See Comments)    Makes pt hyper    Hydromorphone Other (See Comments)    hyperactiviity Other reaction(s): made him wild Other reaction(s): Unknown   Morphine Sulfate     Other reaction(s): at high dose causes confusion Other reaction(s): at high dose causes confusion Other reaction(s): at high dose causes confusion, Other (See Comments) Other reaction(s): at high dose causes confusion Other reaction(s): at high dose causes confusion    Tegretol [Carbamazepine] Hives   Carbamazepine Hives, Rash and Other (See Comments)    Other reaction(s): hives Other reaction(s): Hives Other reaction(s): hives     Social History   Socioeconomic History   Marital status: Married    Spouse name: Not on file   Number of children: Not on file   Years of  education: Not on file   Highest education level: Not on file  Occupational History   Not on file  Tobacco Use   Smoking status: Former    Packs/day: 3.00    Years: 48.00    Additional pack years: 0.00    Total pack years: 144.00    Types: Cigarettes    Quit date: 2000    Years since quitting: 24.4   Smokeless tobacco: Never  Vaping Use   Vaping Use: Never used  Substance and Sexual Activity   Alcohol use: Never   Drug use: Never   Sexual activity: Never  Other Topics Concern   Not on file  Social History Narrative   ** Merged History Encounter **       Social Determinants of Health   Financial Resource Strain: Medium Risk (10/26/2021)   Overall Financial Resource Strain (CARDIA)    Difficulty of Paying Living Expenses: Somewhat hard  Food Insecurity: No Food Insecurity (02/04/2022)   Hunger Vital Sign    Worried About Running Out of Food in the Last Year: Never true    Ran Out of Food in the Last Year: Never true  Transportation Needs: Unmet Transportation Needs (02/04/2022)   PRAPARE - Scientist, research (physical sciences) (Medical): Yes    Lack of Transportation (Non-Medical): No  Physical Activity: Inactive (12/15/2019)   Exercise Vital Sign    Days of Exercise per Week: 0 days    Minutes of Exercise per Session: 0 min  Stress: Stress Concern Present (09/26/2018)   [employer] of Occupational Health - Occupational Stress Questionnaire    Feeling of Stress : To some extent  Social Connections: Not on file  Intimate Partner Violence: Not At Risk (02/04/2022)   Humiliation, Afraid, Rape, and Kick questionnaire    Fear of Current or Ex-Partner: No    Emotionally Abused: No    Physically Abused: No    Sexually Abused: No    Physical Exam      Future Appointments  Date Time Provider Department Center  08/15/2022  1:30 PM [facility] None  08/25/2022  7:00 AM [facility]OT[facility]  11/24/2022  7:00 AM [facility] DEVICE [facility]OTES [facility]  11/24/2022  3:00 PM Jake Bathe, MD [facility]  02/23/2023  7:00 AM [facility] DEVICE REMOTES [facility]  05/25/2023  7:00 AM [facility]ICE [facility]OT[facility]  08/24/2023  7:00 AM [facility] DEVICE REMOTES [facility]

## 2022-08-09 ENCOUNTER — Other Ambulatory Visit (HOSPITAL_COMMUNITY): Payer: Self-pay

## 2022-08-10 NOTE — Progress Notes (Signed)
Paramedicine Encounter    Patient ID: Ruben Murray, male    DOB: 1945-10-27, 77 y.o.   MRN: [id number]   Complaints- shortness of breath on exertion (normal for him)   Assessment- CAOX4 seated in chair in his home, short winded after doing house chores, lungs clear, no swelling, no substantial weight gain, vitals obtained   Compliance with meds- one missed dose of morning, noon and night yesterday.   Pill box filled- for one week   Refills needed- buspar, metoprolol   Meds changes since last visit- none     Social changes- none    Arrived for home visit for [facility] where he was seated in his chair in the living room reporting to be having some shortness of breath because he had just sat down from doing some house chores. This is baseline for him. Lungs clear, no swelling noted. Vitals within normal range. He denied any recent chest pain, dizziness or swelling. I reviewed medications and filled pill box for one week reminding him the importance of taking his medications everyday. He agreed. He reports he has been trying to eat better by going to the grocery store rather than eating take out- I encouraged heart healthy suggestions. He agreed. Appointments reviewed and confirmed. Home visit complete. I will see Ruben Murray in one week.   BP 110/68   Pulse 74   Resp 16   Wt 245 lb (111.1 kg)   SpO2 94%   BMI 37.25 kg/m  Weight yesterday-244lbs Last visit weight-245lbs   Maralyn Sago, EMT-Paramedic [phone]  ACTION: Home visit completed    Patient Care Team: Aliene Beams, MD as PCP - General (Family Medicine) Jake Bathe, MD as PCP - Cardiology (Cardiology) Regan Lemming, MD as PCP - Electrophysiology (Cardiology) Aliene Beams, MD (Family Medicine) [place] as Case Manager (Hospice and Palliative Medicine)  Patient Active Problem List   Diagnosis Date Noted   CHF (congestive heart failure) (HCC) 02/03/2022   Atrial fibrillation (HCC)  11/02/2021   Long term (current) use of anticoagulants 11/02/2021   AKI (acute kidney injury) (HCC) 07/27/2021   Acute pulmonary embolism (HCC) 07/26/2021   Acute on chronic systolic CHF (congestive heart failure) (HCC) 05/17/2021   Hypercoagulable state (HCC) 05/10/2021   Left leg pain 04/24/2021   OSA on CPAP 04/24/2021   Mitral regurgitation 04/24/2021   Tricuspid regurgitation 04/24/2021   Dilated aortic root (HCC) 04/24/2021   Permanent atrial fibrillation (HCC) 04/19/2021   Chronic arthropathy 04/13/2020   Callus 04/13/2020   Onychomycosis 12/12/2019   Left leg cellulitis 12/12/2019   Chronic systolic heart failure (HCC) 12/09/2019   Dilated cardiomyopathy (HCC)    Atrial flutter (HCC) 11/03/2019   Atrial flutter with rapid ventricular response (HCC) 11/03/2019   Acquired thrombophilia (HCC) 01/15/2019   Atrial fibrillation with rapid ventricular response (HCC) 10/29/2017   Chronic combined systolic and diastolic heart failure (HCC) 10/29/2017   Prolonged QT interval 10/29/2017   Sensorineural hearing loss (SNHL) of both ears 01/31/2017   Dysphonia 10/03/2016   Laryngopharyngeal reflux (LPR) 10/03/2016   Nasal polyps 10/03/2016   Rhinitis medicamentosa 10/03/2016   Chronic laryngitis 10/03/2016   Hoarseness 08/31/2016   Moderate persistent asthma 08/02/2016   COPD (chronic obstructive pulmonary disease) (HCC) 08/02/2016   Morbid (severe) obesity due to excess calories (HCC) 08/02/2016   Community acquired pneumonia 03/06/2016   Essential hypertension 03/06/2016   Mixed hyperlipidemia 03/06/2016   Right thyroid nodule 03/06/2016   Seizures (HCC) 03/06/2016   Fever  03/12/2015   Recurrent erosion of cornea 06/23/2011    Current Outpatient Medications:    acetaminophen (TYLENOL) 500 MG tablet, Take 1,000 mg by mouth at bedtime., Disp: , Rfl:    busPIRone (BUSPAR) 5 MG tablet, Take 5 mg by mouth 2 (two) times daily., Disp: , Rfl:    Carboxymethylcellulose Sod PF 0.5 %  SOLN, Place 1 drop into both eyes daily as needed (dry eyes)., Disp: , Rfl:    Cholecalciferol (VITAMIN D3) 25 MCG (1000 UT) CAPS, Take 1,000 Units by mouth daily., Disp: , Rfl:    escitalopram (LEXAPRO) 20 MG tablet, Take 20 mg by mouth at bedtime., Disp: , Rfl:    finasteride (PROSCAR) 5 MG tablet, TAKE 1 TABLET (5 MG TOTAL) BY MOUTH DAILY AT 12 NOON., Disp: 90 tablet, Rfl: 1   fluticasone (FLONASE) 50 MCG/ACT nasal spray, Place 1 spray into both nostrils 2 (two) times daily., Disp: , Rfl:    JARDIANCE 10 MG TABS tablet, TAKE 1 TABLET BY MOUTH DAILY BEFORE BREAKFAST., Disp: 90 tablet, Rfl: 3   loratadine (CLARITIN) 10 MG tablet, Take 10 mg by mouth every morning., Disp: , Rfl:    losartan (COZAAR) 25 MG tablet, TAKE 1/2 TABLET BY MOUTH EVERY DAY, Disp: 45 tablet, Rfl: 3   metoprolol succinate (TOPROL-XL) 100 MG 24 hr tablet, Take 1 tablet (100 mg total) by mouth 2 (two) times daily. Take with or immediately following a meal., Disp: 180 tablet, Rfl: 3   Multiple Vitamin (MULTIVITAMIN WITH MINERALS) TABS tablet, Take 1 tablet by mouth every morning., Disp: , Rfl:    PHENobarbital (LUMINAL) 97.2 MG tablet, Take 97.2 mg by mouth every evening. Takes between 3:00-5:00pm., Disp: , Rfl:    potassium chloride SA (KLOR-CON M) 20 MEQ tablet, Take 1 tablet (20 mEq total) by mouth daily., Disp: 90 tablet, Rfl: 3   pravastatin (PRAVACHOL) 40 MG tablet, Take 40 mg by mouth at bedtime. , Disp: , Rfl:    spironolactone (ALDACTONE) 25 MG tablet, Take 0.5 tablets (12.5 mg total) by mouth daily., Disp: 45 tablet, Rfl: 2   torsemide (DEMADEX) 20 MG tablet, TAKE 3 TABLETS BY MOUTH EVERY DAY, Disp: 270 tablet, Rfl: 0   triamcinolone cream (KENALOG) 0.1 %, Apply 1 application  topically 2 (two) times daily as needed (rash)., Disp: , Rfl:    warfarin (COUMADIN) 5 MG tablet, Take 5 mg by mouth at bedtime., Disp: , Rfl:  Allergies  Allergen Reactions   Dilaudid [Hydromorphone Hcl] Other (See Comments)    Makes pt hyper     Hydromorphone Other (See Comments)    hyperactiviity Other reaction(s): made him wild Other reaction(s): Unknown   Morphine Sulfate     Other reaction(s): at high dose causes confusion Other reaction(s): at high dose causes confusion Other reaction(s): at high dose causes confusion, Other (See Comments) Other reaction(s): at high dose causes confusion Other reaction(s): at high dose causes confusion    Tegretol [Carbamazepine] Hives   Carbamazepine Hives, Rash and Other (See Comments)    Other reaction(s): hives Other reaction(s): Hives Other reaction(s): hives     Social History   Socioeconomic History   Marital status: Married    Spouse name: Not on file   Number of children: Not on file   Years of education: Not on file   Highest education level: Not on file  Occupational History   Not on file  Tobacco Use   Smoking status: Former    Packs/day: 3.00    Years:  48.00    Additional pack years: 0.00    Total pack years: 144.00    Types: Cigarettes    Quit date: 2000    Years since quitting: 24.4   Smokeless tobacco: Never  Vaping Use   Vaping Use: Never used  Substance and Sexual Activity   Alcohol use: Never   Drug use: Never   Sexual activity: Never  Other Topics Concern   Not on file  Social History Narrative   ** Merged History Encounter **       Social Determinants of Health   Financial Resource Strain: Medium Risk (10/26/2021)   Overall Financial Resource Strain (CARDIA)    Difficulty of Paying Living Expenses: Somewhat hard  Food Insecurity: No Food Insecurity (02/04/2022)   Hunger Vital Sign    Worried About Running Out of Food in the Last Year: Never true    Ran Out of Food in the Last Year: Never true  Transportation Needs: Unmet Transportation Needs (02/04/2022)   PRAPARE - Administrator, Civil Service (Medical): Yes    Lack of Transportation (Non-Medical): No  Physical Activity: Inactive (12/15/2019)   Exercise Vital Sign    Days  of Exercise per Week: 0 days    Minutes of Exercise per Session: 0 min  Stress: Stress Concern Present (09/26/2018)   [employer] of Occupational Health - Occupational Stress Questionnaire    Feeling of Stress : To some extent  Social Connections: Not on file  Intimate Partner Violence: Not At Risk (02/04/2022)   Humiliation, Afraid, Rape, and Kick questionnaire    Fear of Current or Ex-Partner: No    Emotionally Abused: No    Physically Abused: No    Sexually Abused: No    Physical Exam      Future Appointments  Date Time Provider Department Center  08/15/2022  1:30 PM [facility] None  08/25/2022  7:00 AM [facility]  11/24/2022  7:00 AM [facility]  11/24/2022  3:00 PM Jake Bathe, MD [facility]  02/23/2023  7:00 AM [facility]  05/25/2023  7:00 AM [facility]  08/24/2023  7:00 AM [facility]ICE [facility]ES [facility]

## 2022-08-15 ENCOUNTER — Ambulatory Visit: Payer: No Typology Code available for payment source | Attending: Cardiology | Admitting: *Deleted

## 2022-08-15 ENCOUNTER — Telehealth (HOSPITAL_COMMUNITY): Payer: Self-pay

## 2022-08-15 DIAGNOSIS — I2699 Other pulmonary embolism without acute cor pulmonale: Secondary | ICD-10-CM

## 2022-08-15 DIAGNOSIS — Z7901 Long term (current) use of anticoagulants: Secondary | ICD-10-CM

## 2022-08-15 DIAGNOSIS — I4891 Unspecified atrial fibrillation: Secondary | ICD-10-CM

## 2022-08-15 DIAGNOSIS — G4733 Obstructive sleep apnea (adult) (pediatric): Secondary | ICD-10-CM

## 2022-08-15 LAB — POCT INR: INR: 2.2 (ref 2.0–3.0)

## 2022-08-15 NOTE — Telephone Encounter (Signed)
Mr. Birdwell is reporting he has a CPAP machine at home but was told he did not have to use it by a Dr. Earl Gala several years ago but this was due to him sleeping in a recliner- however now he is sleeping in a bed. He reports he has a CPAP machine but it's over 79 years old and is asking if he should be using it nightly.   Suggesting he should have a recent sleep study done now that he is followed by [facility] and [facility], and having persistent exertional SHOB. Please advise if provider can order an in house sleep study due to lack of ability to use technology.   Maralyn Sago, EMT-Paramedic [phone] 08/15/2022

## 2022-08-15 NOTE — Patient Instructions (Signed)
Description   Continue taking warfarin 1 tablet daily except 1/2 tablet on Mondays and Fridays. Recheck INR in 6 weeks. [facility] [phone] or [phone]

## 2022-08-16 ENCOUNTER — Telehealth (HOSPITAL_COMMUNITY): Payer: Self-pay

## 2022-08-16 ENCOUNTER — Other Ambulatory Visit (HOSPITAL_COMMUNITY): Payer: Self-pay

## 2022-08-16 NOTE — Progress Notes (Signed)
Paramedicine Encounter    Patient ID: Ruben Murray, male    DOB: 09-08-1945, 77 y.o.   MRN: [id number]   Complaints- exertional shortness of breath, weight gain, new onset of urinary incontinence, increased constipation, toenails  Assessment- CAOX4, warm and dry, seated in hospital bed in living room, no shortness of breath at rest, no chest pain, no dizziness, no palpitations, no lower leg swelling, no JVD, lungs clear.   Compliance with meds- no missed doses of meds   Pill box filled- for one week   Refills needed- NONE   Meds changes since last visit- NONE     Social changes- NONE   Arrived for home visit for Mr. Danh who was laying in his bed alert and oriented reporting to be feeling okay but reported having some urinary incontinence, frequent constipation. He denied burning, or stinging when urinating. No foul odor or discoloration of urine. He reports using Miralax 3-4 days a week for bowels. He says he just noticed this increased urinary incontinence for one week. Vitals obtained as noted. He asked if we could assist in cutting his toenails- I advised him he should follow up at his podiatrist, he reports he has an outstanding balance with their office and has not been seen in a while. I will reach out to see the amount accrued and schedule if possible for same. Meds reviewed and confirmed- pill box filled for one week. Freddy (care giver/ friend) picked up missing meds and placed them in pill box with me on the phone confirming same. We reviewed upcoming appointments. [facility] also [facility] to get [facility] grant worked out for [employer] with [facility] was able to re-enroll him and send over info to make copay zero dollars. No other social concerns. Home visit complete. I will see Isidro in one week.    BP 112/72   Pulse 74   Resp 16   Wt 249 lb (112.9 kg)   SpO2 93%   BMI 37.86 kg/m  Weight yesterday-- 250lbs  Last visit weight-244lbs   Maralyn Sago,  EMT-Paramedic [phone]  ACTION: Home visit completed    Patient Care Team: Aliene Beams, MD as PCP - General (Family Medicine) Jake Bathe, MD as PCP - Cardiology (Cardiology) Regan Lemming, MD as PCP - Electrophysiology (Cardiology) Aliene Beams, MD (Family Medicine) [facility] as Case Manager (Hospice and Palliative Medicine)  Patient Active Problem List   Diagnosis Date Noted   CHF (congestive heart failure) (HCC) 02/03/2022   Atrial fibrillation (HCC) 11/02/2021   Long term (current) use of anticoagulants 11/02/2021   AKI (acute kidney injury) (HCC) 07/27/2021   Acute pulmonary embolism (HCC) 07/26/2021   Acute on chronic systolic CHF (congestive heart failure) (HCC) 05/17/2021   Hypercoagulable state (HCC) 05/10/2021   Left leg pain 04/24/2021   OSA on CPAP 04/24/2021   Mitral regurgitation 04/24/2021   Tricuspid regurgitation 04/24/2021   Dilated aortic root (HCC) 04/24/2021   Permanent atrial fibrillation (HCC) 04/19/2021   Chronic arthropathy 04/13/2020   Callus 04/13/2020   Onychomycosis 12/12/2019   Left leg cellulitis 12/12/2019   Chronic systolic heart failure (HCC) 12/09/2019   Dilated cardiomyopathy (HCC)    Atrial flutter (HCC) 11/03/2019   Atrial flutter with rapid ventricular response (HCC) 11/03/2019   Acquired thrombophilia (HCC) 01/15/2019   Atrial fibrillation with rapid ventricular response (HCC) 10/29/2017   Chronic combined systolic and diastolic heart failure (HCC) 10/29/2017   Prolonged QT interval 10/29/2017   Sensorineural  hearing loss (SNHL) of both ears 01/31/2017   Dysphonia 10/03/2016   Laryngopharyngeal reflux (LPR) 10/03/2016   Nasal polyps 10/03/2016   Rhinitis medicamentosa 10/03/2016   Chronic laryngitis 10/03/2016   Hoarseness 08/31/2016   Moderate persistent asthma 08/02/2016   COPD (chronic obstructive pulmonary disease) (HCC) 08/02/2016   Morbid (severe) obesity due to excess calories (HCC)  08/02/2016   Community acquired pneumonia 03/06/2016   Essential hypertension 03/06/2016   Mixed hyperlipidemia 03/06/2016   Right thyroid nodule 03/06/2016   Seizures (HCC) 03/06/2016   Fever 03/12/2015   Recurrent erosion of cornea 06/23/2011    Current Outpatient Medications:    acetaminophen (TYLENOL) 500 MG tablet, Take 1,000 mg by mouth at bedtime., Disp: , Rfl:    busPIRone (BUSPAR) 5 MG tablet, Take 5 mg by mouth 2 (two) times daily., Disp: , Rfl:    Carboxymethylcellulose Sod PF 0.5 % SOLN, Place 1 drop into both eyes daily as needed (dry eyes)., Disp: , Rfl:    Cholecalciferol (VITAMIN D3) 25 MCG (1000 UT) CAPS, Take 1,000 Units by mouth daily., Disp: , Rfl:    escitalopram (LEXAPRO) 20 MG tablet, Take 20 mg by mouth at bedtime., Disp: , Rfl:    finasteride (PROSCAR) 5 MG tablet, TAKE 1 TABLET (5 MG TOTAL) BY MOUTH DAILY AT 12 NOON., Disp: 90 tablet, Rfl: 1   fluticasone (FLONASE) 50 MCG/ACT nasal spray, Place 1 spray into both nostrils 2 (two) times daily., Disp: , Rfl:    JARDIANCE 10 MG TABS tablet, TAKE 1 TABLET BY MOUTH DAILY BEFORE BREAKFAST., Disp: 90 tablet, Rfl: 3   loratadine (CLARITIN) 10 MG tablet, Take 10 mg by mouth every morning., Disp: , Rfl:    losartan (COZAAR) 25 MG tablet, TAKE 1/2 TABLET BY MOUTH EVERY DAY, Disp: 45 tablet, Rfl: 3   metoprolol succinate (TOPROL-XL) 100 MG 24 hr tablet, Take 1 tablet (100 mg total) by mouth 2 (two) times daily. Take with or immediately following a meal., Disp: 180 tablet, Rfl: 3   Multiple Vitamin (MULTIVITAMIN WITH MINERALS) TABS tablet, Take 1 tablet by mouth every morning., Disp: , Rfl:    PHENobarbital (LUMINAL) 97.2 MG tablet, Take 97.2 mg by mouth every evening. Takes between 3:00-5:00pm., Disp: , Rfl:    potassium chloride SA (KLOR-CON M) 20 MEQ tablet, Take 1 tablet (20 mEq total) by mouth daily., Disp: 90 tablet, Rfl: 3   pravastatin (PRAVACHOL) 40 MG tablet, Take 40 mg by mouth at bedtime. , Disp: , Rfl:     spironolactone (ALDACTONE) 25 MG tablet, Take 0.5 tablets (12.5 mg total) by mouth daily., Disp: 45 tablet, Rfl: 2   torsemide (DEMADEX) 20 MG tablet, TAKE 3 TABLETS BY MOUTH EVERY DAY, Disp: 270 tablet, Rfl: 0   triamcinolone cream (KENALOG) 0.1 %, Apply 1 application  topically 2 (two) times daily as needed (rash)., Disp: , Rfl:    warfarin (COUMADIN) 5 MG tablet, Take 5 mg by mouth at bedtime., Disp: , Rfl:  Allergies  Allergen Reactions   Dilaudid [Hydromorphone Hcl] Other (See Comments)    Makes pt hyper    Hydromorphone Other (See Comments)    hyperactiviity Other reaction(s): made him wild Other reaction(s): Unknown   Morphine Sulfate     Other reaction(s): at high dose causes confusion Other reaction(s): at high dose causes confusion Other reaction(s): at high dose causes confusion, Other (See Comments) Other reaction(s): at high dose causes confusion Other reaction(s): at high dose causes confusion    Tegretol [Carbamazepine] Hives  Carbamazepine Hives, Rash and Other (See Comments)    Other reaction(s): hives Other reaction(s): Hives Other reaction(s): hives     Social History   Socioeconomic History   Marital status: Married    Spouse name: Not on file   Number of children: Not on file   Years of education: Not on file   Highest education level: Not on file  Occupational History   Not on file  Tobacco Use   Smoking status: Former    Packs/day: 3.00    Years: 48.00    Additional pack years: 0.00    Total pack years: 144.00    Types: Cigarettes    Quit date: 2000    Years since quitting: 24.4   Smokeless tobacco: Never  Vaping Use   Vaping Use: Never used  Substance and Sexual Activity   Alcohol use: Never   Drug use: Never   Sexual activity: Never  Other Topics Concern   Not on file  Social History Narrative   ** Merged History Encounter **       Social Determinants of Health   Financial Resource Strain: Medium Risk (10/26/2021)   Overall  Financial Resource Strain (CARDIA)    Difficulty of Paying Living Expenses: Somewhat hard  Food Insecurity: No Food Insecurity (02/04/2022)   Hunger Vital Sign    Worried About Running Out of Food in the Last Year: Never true    Ran Out of Food in the Last Year: Never true  Transportation Needs: Unmet Transportation Needs (02/04/2022)   PRAPARE - Administrator, Civil Service (Medical): Yes    Lack of Transportation (Non-Medical): No  Physical Activity: Inactive (12/15/2019)   Exercise Vital Sign    Days of Exercise per Week: 0 days    Minutes of Exercise per Session: 0 min  Stress: Stress Concern Present (09/26/2018)   [employer] of Occupational Health - Occupational Stress Questionnaire    Feeling of Stress : To some extent  Social Connections: Not on file  Intimate Partner Violence: Not At Risk (02/04/2022)   Humiliation, Afraid, Rape, and Kick questionnaire    Fear of Current or Ex-Partner: No    Emotionally Abused: No    Physically Abused: No    Sexually Abused: No    Physical Exam      Future Appointments  Date Time Provider Department Center  08/25/2022  7:00 AM [facility]  09/26/2022  1:30 PM [facility] None  11/24/2022  7:00 AM [facility]  11/24/2022  3:00 PM Jake Bathe, MD [facility]  02/23/2023  7:00 AM [facility]  05/25/2023  7:00 AM [facility]ICE [facility]OT[facility]  08/24/2023  7:00 AM [facility]

## 2022-08-16 NOTE — Telephone Encounter (Signed)
If he is no longer sleeping in a recliner he should restart CPAP  If updated equipment is needed and Dr Earl Gala is not able to rewrite will need referral sleep apnea/Dr Mayford Knife  -if a smart tablet/device is not in the home and does not have family to assist with technology-in lab study is needed

## 2022-08-16 NOTE — Telephone Encounter (Signed)
Advanced Heart Failure Patient Advocate Encounter  The patient was approved for a [employer] grant that will help cover the cost of Jardiance, Losartan, Metoprolol, Spironolactone.  Total amount awarded, $10,000.  Effective: 07/17/2022 - 07/16/2023.  BIN [id number] PCN [id number] [id number] ID [id number]  Updated processing information provided to [facility] directly. Informed patient by voicemail.  Burnell Blanks, CPhT Rx Patient Advocate Phone: [phone]

## 2022-08-16 NOTE — Telephone Encounter (Signed)
Spoke to [place] [facility] and they reports his [employer] [place] Bucker is expired for Jardiance. Please renew this for him as his copay is $147. Thanks!   Maralyn Sago, EMT-Paramedic [phone] 08/16/2022

## 2022-08-18 NOTE — Addendum Note (Signed)
Addended by: Theresia Bough on: 08/18/2022 04:36 PM   Modules accepted: Orders

## 2022-08-18 NOTE — Telephone Encounter (Signed)
Ok for referral to Dr Mayford Knife for OSA management/CPAP equipment update?

## 2022-08-18 NOTE — Telephone Encounter (Signed)
Orders placed.

## 2022-08-19 DIAGNOSIS — I5042 Chronic combined systolic (congestive) and diastolic (congestive) heart failure: Secondary | ICD-10-CM | POA: Diagnosis not present

## 2022-08-21 ENCOUNTER — Telehealth (HOSPITAL_COMMUNITY): Payer: Self-pay

## 2022-08-21 NOTE — Telephone Encounter (Signed)
I contacted [facility] for Ruben Murray and set up an appointment for him for Friday at 1:15. They advised he needs to bring his ID and [employer].  He is aware and agreeable. Call complete.   Maralyn Sago, EMT-Paramedic [phone] 08/21/2022

## 2022-08-22 ENCOUNTER — Emergency Department (HOSPITAL_COMMUNITY): Payer: No Typology Code available for payment source

## 2022-08-22 ENCOUNTER — Emergency Department (HOSPITAL_COMMUNITY)
Admission: EM | Admit: 2022-08-22 | Discharge: 2022-08-22 | Disposition: A | Discharge status: Home / Self Care | Payer: No Typology Code available for payment source | Attending: Emergency Medicine | Admitting: Emergency Medicine

## 2022-08-22 DIAGNOSIS — R42 Dizziness and giddiness: Secondary | ICD-10-CM | POA: Diagnosis not present

## 2022-08-22 DIAGNOSIS — I110 Hypertensive heart disease with heart failure: Secondary | ICD-10-CM | POA: Diagnosis not present

## 2022-08-22 DIAGNOSIS — Z7951 Long term (current) use of inhaled steroids: Secondary | ICD-10-CM | POA: Diagnosis not present

## 2022-08-22 DIAGNOSIS — J449 Chronic obstructive pulmonary disease, unspecified: Secondary | ICD-10-CM | POA: Diagnosis not present

## 2022-08-22 DIAGNOSIS — I4891 Unspecified atrial fibrillation: Secondary | ICD-10-CM | POA: Insufficient documentation

## 2022-08-22 DIAGNOSIS — R55 Syncope and collapse: Secondary | ICD-10-CM | POA: Diagnosis not present

## 2022-08-22 DIAGNOSIS — R0902 Hypoxemia: Secondary | ICD-10-CM | POA: Diagnosis not present

## 2022-08-22 DIAGNOSIS — I509 Heart failure, unspecified: Secondary | ICD-10-CM | POA: Diagnosis not present

## 2022-08-22 DIAGNOSIS — Z79899 Other long term (current) drug therapy: Secondary | ICD-10-CM | POA: Diagnosis not present

## 2022-08-22 DIAGNOSIS — J45909 Unspecified asthma, uncomplicated: Secondary | ICD-10-CM | POA: Insufficient documentation

## 2022-08-22 DIAGNOSIS — Z950 Presence of cardiac pacemaker: Secondary | ICD-10-CM | POA: Diagnosis not present

## 2022-08-22 DIAGNOSIS — R531 Weakness: Secondary | ICD-10-CM | POA: Diagnosis not present

## 2022-08-22 DIAGNOSIS — E860 Dehydration: Secondary | ICD-10-CM

## 2022-08-22 DIAGNOSIS — Z7901 Long term (current) use of anticoagulants: Secondary | ICD-10-CM | POA: Insufficient documentation

## 2022-08-22 DIAGNOSIS — I951 Orthostatic hypotension: Secondary | ICD-10-CM

## 2022-08-22 DIAGNOSIS — I10 Essential (primary) hypertension: Secondary | ICD-10-CM | POA: Diagnosis not present

## 2022-08-22 DIAGNOSIS — R457 State of emotional shock and stress, unspecified: Secondary | ICD-10-CM | POA: Diagnosis not present

## 2022-08-22 LAB — CBC WITH DIFFERENTIAL/PLATELET
Abs Immature Granulocytes: 0.02 10*3/uL (ref 0.00–0.07)
Basophils Absolute: 0.1 10*3/uL (ref 0.0–0.1)
Basophils Relative: 1 %
Eosinophils Absolute: 0.2 10*3/uL (ref 0.0–0.5)
Eosinophils Relative: 2 %
HCT: 49.7 % (ref 39.0–52.0)
Hemoglobin: 15.8 g/dL (ref 13.0–17.0)
Immature Granulocytes: 0 %
Lymphocytes Relative: 28 %
Lymphs Abs: 2.2 10*3/uL (ref 0.7–4.0)
MCH: 31.5 pg (ref 26.0–34.0)
MCHC: 31.8 g/dL (ref 30.0–36.0)
MCV: 99.2 fL (ref 80.0–100.0)
Monocytes Absolute: 0.5 10*3/uL (ref 0.1–1.0)
Monocytes Relative: 6 %
Neutro Abs: 5 10*3/uL (ref 1.7–7.7)
Neutrophils Relative %: 63 %
Platelets: 163 10*3/uL (ref 150–400)
RBC: 5.01 MIL/uL (ref 4.22–5.81)
RDW: 13.9 % (ref 11.5–15.5)
WBC: 7.9 10*3/uL (ref 4.0–10.5)
nRBC: 0 % (ref 0.0–0.2)

## 2022-08-22 LAB — URINALYSIS, ROUTINE W REFLEX MICROSCOPIC
Bilirubin Urine: NEGATIVE
Glucose, UA: >=500 mg/dL — AB
Hgb urine dipstick: NEGATIVE
Ketones, ur: NEGATIVE mg/dL
Leukocytes,Ua: NEGATIVE
Nitrite: NEGATIVE
Protein, ur: NEGATIVE mg/dL
Specific Gravity, Urine: 1.009 (ref 1.005–1.030)
pH: 6 (ref 5.0–8.0)

## 2022-08-22 LAB — COMPREHENSIVE METABOLIC PANEL
ALT: 30 U/L (ref 0–44)
AST: 32 U/L (ref 15–41)
Albumin: 3.9 g/dL (ref 3.5–5.0)
Alkaline Phosphatase: 108 U/L (ref 38–126)
Anion gap: 12 (ref 5–15)
BUN: 25 mg/dL — ABNORMAL HIGH (ref 8–23)
CO2: 30 mmol/L (ref 22–32)
Calcium: 8.6 mg/dL — ABNORMAL LOW (ref 8.9–10.3)
Chloride: 98 mmol/L (ref 98–111)
Creatinine, Ser: 1.25 mg/dL — ABNORMAL HIGH (ref 0.61–1.24)
GFR, Estimated: 59 mL/min — ABNORMAL LOW (ref >60)
Glucose, Bld: 99 mg/dL (ref 70–99)
Potassium: 4.4 mmol/L (ref 3.5–5.1)
Sodium: 140 mmol/L (ref 135–145)
Total Bilirubin: 0.4 mg/dL (ref 0.3–1.2)
Total Protein: 7.3 g/dL (ref 6.5–8.1)

## 2022-08-22 LAB — TROPONIN I (HIGH SENSITIVITY): Troponin I (High Sensitivity): 5 ng/L (ref <18)

## 2022-08-22 LAB — PROTIME-INR
INR: 1.9 — ABNORMAL HIGH (ref 0.8–1.2)
Prothrombin Time: 21.7 seconds — ABNORMAL HIGH (ref 11.4–15.2)

## 2022-08-22 LAB — CBG MONITORING, ED: Glucose-Capillary: 98 mg/dL (ref 70–99)

## 2022-08-22 LAB — MAGNESIUM: Magnesium: 2.5 mg/dL — ABNORMAL HIGH (ref 1.7–2.4)

## 2022-08-22 MED ORDER — LACTATED RINGERS IV BOLUS
500.0000 mL | Freq: Once | INTRAVENOUS | Status: AC
  Administered 2022-08-22: 500 mL via INTRAVENOUS

## 2022-08-22 NOTE — ED Triage Notes (Signed)
Pt bib [facility] c/o generalized weakness, feeling anxious. Pt had a pacemaker placed 5 weeks ago. Pt has an on demand pace maker. Compliant with medications, took prior to arrival. Has not ate today. Also reports increased urination.   EMS vitals 108/60 84 HR 96 O2 106 CBG

## 2022-08-22 NOTE — ED Provider Notes (Signed)
6:27 PM Patient signed out to me by previous ED physician. Patient is a 77 yo male presenting for near syncope with presyncopal symptoms.  Had orthostatic hypotension here in ED. On Torsemide 60 gm daily. Likely hypovolemic. Received IVF. Pending pacemaker interrogation.ECG and trops stable.    Physical Exam  BP 103/69 (BP Location: Left Arm)   Pulse 77   Temp 97.8 F (36.6 C) (Oral)   Resp 20   SpO2 100%   Physical Exam  Procedures  Procedures  ED Course / MDM    Medical Decision Making Amount and/or Complexity of Data Reviewed Labs: ordered. Radiology: ordered.   ECG and cardiac markers stable. Pacemaker interrogation stable. Patient received IV fluids.  Blood pressure 103/63.  Orthostatics still present however closer to patient's baseline.  Recommend discharge at this time with close follow-up with primary care physician this week for medical management of furosemide and considering decreasing dose versus discontinuing if orthostatic hypotension continues.  Patient in no distress and overall condition improved here in the ED. Detailed discussions were had with the patient regarding current findings, and need for close f/u with PCP or on call doctor. The patient has been instructed to return immediately if the symptoms worsen in any way for re-evaluation. Patient verbalized understanding and is in agreement with current care plan. All questions answered prior to discharge.       Franne Forts, DO 08/22/22 [id number]

## 2022-08-22 NOTE — Discharge Instructions (Addendum)
Please follow-up with your cardiologist and your PCP for review of your home medications.  You can try spreading out your blood pressure medications in the morning to avoid drops in blood pressure and symptoms of dizziness.  Return to the emergency department for any new or worsening symptoms of concern.

## 2022-08-22 NOTE — ED Provider Notes (Signed)
[place] EMERGENCY DEPARTMENT AT [facility] Provider Note   CSN: [id number] Arrival date & time: 08/22/22  1112     History  Chief Complaint  Patient presents with   Weakness    Ruben Murray is a 77 y.o. male.  HPI Patient presents for near syncope.  Medical history includes HTN, HLD, seizures, asthma, COPD, atrial fibrillation, CHF, OSA, PE.  He is on warfarin.  He had a pacemaker placed 5 weeks ago.  He lives alone.  He ambulates with a walker at baseline.  He states that he was in his normal state of health yesterday.  This morning, he experienced symptoms of dizziness.  He reports that he woke up and took his morning medications prior to this episode.  Episode lasted for approximately 1 hour.  EMS was called to his home.  He was feeling better at time of their arrival.  He currently states that all symptoms have resolved.     Home Medications Prior to Admission medications   Medication Sig Start Date End Date Taking? Authorizing Provider  acetaminophen (TYLENOL) 500 MG tablet Take 1,000 mg by mouth at bedtime.    [provider]  busPIRone (BUSPAR) 5 MG tablet Take 5 mg by mouth 2 (two) times daily. 10/26/21   [provider]  Carboxymethylcellulose Sod PF 0.5 % SOLN Place 1 drop into both eyes daily as needed (dry eyes).    [provider]  Cholecalciferol (VITAMIN D3) 25 MCG (1000 UT) CAPS Take 1,000 Units by mouth daily.    [provider]  escitalopram (LEXAPRO) 20 MG tablet Take 20 mg by mouth at bedtime.    [provider]  finasteride (PROSCAR) 5 MG tablet TAKE 1 TABLET (5 MG TOTAL) BY MOUTH DAILY AT 12 NOON. 05/09/22   Bensimhon, Bevelyn Buckles, MD  fluticasone (FLONASE) 50 MCG/ACT nasal spray Place 1 spray into both nostrils 2 (two) times daily. 10/24/17   [provider]  JARDIANCE 10 MG TABS tablet TAKE 1 TABLET BY MOUTH DAILY BEFORE BREAKFAST. 11/17/21   Bensimhon, Bevelyn Buckles, MD  loratadine (CLARITIN) 10 MG  tablet Take 10 mg by mouth every morning.    [provider]  losartan (COZAAR) 25 MG tablet TAKE 1/2 TABLET BY MOUTH EVERY DAY 11/03/21   Bensimhon, Bevelyn Buckles, MD  metoprolol succinate (TOPROL-XL) 100 MG 24 hr tablet Take 1 tablet (100 mg total) by mouth 2 (two) times daily. Take with or immediately following a meal. 01/31/22   [place]ford, Anderson Malta, FNP  Multiple Vitamin (MULTIVITAMIN WITH MINERALS) TABS tablet Take 1 tablet by mouth every morning.    [provider]  PHENobarbital (LUMINAL) 97.2 MG tablet Take 97.2 mg by mouth every evening. Takes between 3:00-5:00pm.    [provider]  potassium chloride SA (KLOR-CON M) 20 MEQ tablet Take 1 tablet (20 mEq total) by mouth daily. 12/21/21   Bensimhon, Bevelyn Buckles, MD  pravastatin (PRAVACHOL) 40 MG tablet Take 40 mg by mouth at bedtime.     [provider]  spironolactone (ALDACTONE) 25 MG tablet Take 0.5 tablets (12.5 mg total) by mouth daily. 03/15/22   Laurey Morale, MD  torsemide (DEMADEX) 20 MG tablet TAKE 3 TABLETS BY MOUTH EVERY DAY 07/07/22   Bensimhon, Bevelyn Buckles, MD  triamcinolone cream (KENALOG) 0.1 % Apply 1 application  topically 2 (two) times daily as needed (rash). 05/13/21   [provider]  warfarin (COUMADIN) 5 MG tablet Take 5 mg by mouth at bedtime.  10/12/21         Allergies    Dilaudid [hydromorphone hcl], Hydromorphone, Morphine sulfate, Tegretol [carbamazepine], and Carbamazepine    Review of Systems   Review of Systems  Neurological:  Positive for dizziness and light-headedness.  All other systems reviewed and are negative.   Physical Exam Updated Vital Signs BP 103/69 (BP Location: Left Arm)   Pulse 77   Temp 97.8 F (36.6 C) (Oral)   Resp 20   SpO2 100%  Physical Exam Vitals and nursing note reviewed.  Constitutional:      General: He is not in acute distress.    Appearance: Normal appearance. He is well-developed. He is not ill-appearing, toxic-appearing or diaphoretic.   HENT:     Head: Normocephalic and atraumatic.     Right Ear: External ear normal.     Left Ear: External ear normal.     Nose: Nose normal.     Mouth/Throat:     Mouth: Mucous membranes are moist.  Eyes:     Extraocular Movements: Extraocular movements intact.     Conjunctiva/sclera: Conjunctivae normal.  Cardiovascular:     Rate and Rhythm: Normal rate and regular rhythm.     Heart sounds: No murmur heard. Pulmonary:     Effort: Pulmonary effort is normal. No respiratory distress.     Breath sounds: Normal breath sounds. No wheezing or rales.  Abdominal:     General: There is no distension.     Palpations: Abdomen is soft.     Tenderness: There is no abdominal tenderness.  Musculoskeletal:        General: No swelling. Normal range of motion.     Cervical back: Normal range of motion and neck supple.  Skin:    General: Skin is warm and dry.     Coloration: Skin is not jaundiced or pale.  Neurological:     General: No focal deficit present.     Mental Status: He is alert and oriented to person, place, and time.     Cranial Nerves: No cranial nerve deficit.     Sensory: No sensory deficit.     Motor: No weakness.     Coordination: Coordination normal.  Psychiatric:        Mood and Affect: Mood normal.        Behavior: Behavior normal.        Thought Content: Thought content normal.        Judgment: Judgment normal.     ED Results / Procedures / Treatments   Labs (all labs ordered are listed, but only abnormal results are displayed) Labs Reviewed  COMPREHENSIVE METABOLIC PANEL - Abnormal; Notable for the following components:      Result Value   BUN 25 (*)    Creatinine, Ser 1.25 (*)    Calcium 8.6 (*)    GFR, Estimated 59 (*)    All other components within normal limits  MAGNESIUM - Abnormal; Notable for the following components:   Magnesium 2.5 (*)    All other components within normal limits  URINALYSIS, ROUTINE W REFLEX MICROSCOPIC - Abnormal; Notable for the  following components:   Glucose, UA >=500 (*)    Bacteria, UA RARE (*)    All other components within normal limits  PROTIME-INR - Abnormal; Notable for the following components:   Prothrombin Time 21.7 (*)    INR 1.9 (*)    All other components within normal limits  CBC WITH DIFFERENTIAL/PLATELET  CBG MONITORING, ED  EKG EKG Interpretation  Date/Time:  Tuesday August 22 2022 11:20:06 EDT Ventricular Rate:  96 PR Interval:    QRS Duration: 168 QT Interval:  397 QTC Calculation: 502 R Axis:   -37 Text Interpretation: Atrial fibrillation Right bundle branch block Confirmed by Gloris Manchester [phone]) on 08/22/2022 4:06:10 PM  Radiology CT HEAD WO CONTRAST  Result Date: 08/22/2022 CLINICAL DATA:  Syncope.  Cerebrovascular cause suspected. EXAM: CT HEAD WITHOUT CONTRAST TECHNIQUE: Contiguous axial images were obtained from the base of the skull through the vertex without intravenous contrast. RADIATION DOSE REDUCTION: This exam was performed according to the departmental dose-optimization program which includes automated exposure control, adjustment of the mA and/or kV according to patient size and/or use of iterative reconstruction technique. COMPARISON:  05/11/2022 FINDINGS: Brain: Cerebral atrophy is similar and likely within normal variation for age. Mild low density in the periventricular white matter likely related to small vessel disease. Cerebellar atrophy also identified. No mass lesion, hemorrhage, hydrocephalus, acute infarct, intra-axial, or extra-axial fluid collection. Vascular: No hyperdense vessel or unexpected calcification. Skull: Normal Sinuses/Orbits: Normal imaged portions of the orbits and globes. Relatively similar appearance of soft tissue density filling the right maxillary sinus and extending into the right nasal cavity including on 06/04. Right ethmoid air cell partial opacification as well. Clear mastoid air cells. Other: None. IMPRESSION: 1.  No acute intracranial  abnormality. 2.  Cerebral atrophy and small vessel ischemic change. 3. Relatively similar appearance of soft tissue density filling the right maxillary sinus and extending into the right nasal cavity. Please see 05/11/2022 dedicated report. Electronically Signed   By: Jeronimo Greaves M.D.   On: 08/22/2022 14:19   DG Chest Port 1 View  Result Date: 08/22/2022 CLINICAL DATA:  dizziness EXAM: PORTABLE CHEST - 1 VIEW COMPARISON:  02/04/2022 FINDINGS: Lungs are clear. Heart size and mediastinal contours are within normal limits. Stable left subclavian AICD. No effusion. Visualized bones unremarkable. IMPRESSION: No acute cardiopulmonary disease. Electronically Signed   By: Corlis Leak M.D.   On: 08/22/2022 13:52    Procedures Procedures    Medications Ordered in ED Medications  lactated ringers bolus 500 mL (500 mLs Intravenous New Bag/Given 08/22/22 1654)    ED Course/ Medical Decision Making/ A&P                             Medical Decision Making Amount and/or Complexity of Data Reviewed Labs: ordered. Radiology: ordered.   This patient presents to the ED for concern of near syncope, this involves an extensive number of treatment options, and is a complaint that carries with it a high risk of complications and morbidity.  The differential diagnosis includes polypharmacy, dehydration, arrhythmia, vasovagal episode, TIA   Co morbidities that complicate the patient evaluation  HTN, HLD, seizures, asthma, COPD, atrial fibrillation, CHF, OSA, PE   Additional history obtained:  Additional history obtained from N/A External records from outside source obtained and reviewed including EMR   Lab Tests:  I Ordered, and personally interpreted labs.  The pertinent results include: Baseline creatinine, hypomagnesemia with otherwise normal electrolytes, normal hemoglobin, no leukocytosis, INR slightly subtherapeutic   Imaging Studies ordered:  I ordered imaging studies including chest x-ray,  CT head I independently visualized and interpreted imaging which showed no acute findings I agree with the radiologist interpretation   Cardiac Monitoring: / EKG:  The patient was maintained on a cardiac monitor.  I personally viewed and interpreted the cardiac monitored  which showed an underlying rhythm of: Atrial fibrillation  Problem List / ED Course / Critical interventions / Medication management  Patient presents after some near syncopal symptoms that occurred this morning.  Symptoms were improved by the time of EMS arrival on scene and resolved by the time of patient arrival in the ED.  On initial assessment, patient is well-appearing.  Vital signs are notable for soft blood pressures which she states is normal for him.  He does take multiple blood pressure medications at home.  He is also on high-dose torsemide, although he states that he recently ran out.  He has no focal neurologic deficits on exam.  He is currently asymptomatic.  Patient was placed on bedside cardiac monitor.  Workup was initiated.  He remained asymptomatic while in the ED and initial workup results are unremarkable.  On reassessment, patient was assessed for orthostatic hypotension.  He did have a 25 point drop in SBP when going from recumbent position to sitting on edge of bed.  I suspect symptoms this morning were secondary to polypharmacy and overdiuresis.  Gentle IV fluids were given.  Patient also has a recently placed pacemaker.  Plan will be for reassessment of orthostatic vital signs and pacemaker interrogation.  Discharge is anticipated.  He would benefit from close follow-up with outpatient PCP and cardiologist for review of his current medications.  Care of patient was signed to oncoming ED provider. I ordered medication including IV fluids for hydration Reevaluation of the patient after these medicines showed that the patient improved I have reviewed the patients home medicines and have made adjustments as  needed   Social Determinants of Health:  Has access to outpatient care        Final Clinical Impression(s) / ED Diagnoses Final diagnoses:  Near syncope    Rx / DC Orders ED Discharge Orders          Ordered    Ambulatory referral to Cardiology       Comments: If you have not heard from the Cardiology office within the next 72 hours please call [phone].   08/22/22 1738              Gloris Manchester, MD 08/22/22 [phone]

## 2022-08-23 ENCOUNTER — Other Ambulatory Visit (HOSPITAL_COMMUNITY): Payer: Self-pay

## 2022-08-23 ENCOUNTER — Telehealth (HOSPITAL_COMMUNITY): Payer: Self-pay

## 2022-08-23 DIAGNOSIS — R55 Syncope and collapse: Secondary | ICD-10-CM | POA: Diagnosis not present

## 2022-08-23 NOTE — Telephone Encounter (Signed)
Pt aware via Heather with para medicine  

## 2022-08-23 NOTE — Progress Notes (Signed)
Paramedicine Encounter    Patient ID: Ruben Murray, male    DOB: 1945-11-01, 77 y.o.   MRN: [id number]   Complaints- tired   Assessment- CAOX4, warm and dry seated on his bed- he reports feeling tired after being in the ED yesterday and having weakness and dizziness yesterday. He has no swelling noted, lungs clear, vitals as noted, he denied chest pain, denied dizziness today.   Compliance with meds- no missed doses of meds in the last week   Pill box filled- for one week   Refills needed- phenobarbital called into [facility]   Meds changes since last visit- TODAY: torsemide reduced to 40mg  per Prince Rome, NP.     Social changes- none   Arrived for follow up for [name] in the home today. He was seen in the ER yesterday for dehydration and fatigue with dizziness. He denied any recent nausea, vomiting or diarrhea. He admits he is not drinking enough fluids throughout the day- I encouraged him to drink 2L daily and to wear compression stockings as well as taking medications as prescribed. After HF consult following ED visit yesterday he is now on Torsemide 40mg  daily. I advised him to be sure to weigh daily which he does very well. Today he is 246lbs I obtained vitals as noted. He denied any dizziness today, just general fatigue. We reviewed meds and I filled pill box for one week. We reviewed upcoming appointments and confirmed same. He knows to reach out if anything changes. Home visit complete.    BP (!) 102/58   Pulse 78   Resp 16   Wt 246 lb 9.6 oz (111.9 kg)   SpO2 97%   BMI 37.50 kg/m  Weight yesterday-- 249lbs  Last visit weight-- 249lbs    Maralyn Sago, EMT-Paramedic [phone]  ACTION: Home visit completed    Patient Care Team: Aliene Beams, MD as PCP - General (Family Medicine) Jake Bathe, MD as PCP - Cardiology (Cardiology) Regan Lemming, MD as PCP - Electrophysiology (Cardiology) Aliene Beams, MD (Family Medicine) [facility] as Case Manager (Hospice and Palliative Medicine)  Patient Active Problem List   Diagnosis Date Noted   CHF (congestive heart failure) (HCC) 02/03/2022   Atrial fibrillation (HCC) 11/02/2021   Long term (current) use of anticoagulants 11/02/2021   AKI (acute kidney injury) (HCC) 07/27/2021   Acute pulmonary embolism (HCC) 07/26/2021   Acute on chronic systolic CHF (congestive heart failure) (HCC) 05/17/2021   Hypercoagulable state (HCC) 05/10/2021   Left leg pain 04/24/2021   OSA on CPAP 04/24/2021   Mitral regurgitation 04/24/2021   Tricuspid regurgitation 04/24/2021   Dilated aortic root (HCC) 04/24/2021   Permanent atrial fibrillation (HCC) 04/19/2021   Chronic arthropathy 04/13/2020   Callus 04/13/2020   Onychomycosis 12/12/2019   Left leg cellulitis 12/12/2019   Chronic systolic heart failure (HCC) 12/09/2019   Dilated cardiomyopathy (HCC)    Atrial flutter (HCC) 11/03/2019   Atrial flutter with rapid ventricular response (HCC) 11/03/2019   Acquired thrombophilia (HCC) 01/15/2019   Atrial fibrillation with rapid ventricular response (HCC) 10/29/2017   Chronic combined systolic and diastolic heart failure (HCC) 10/29/2017   Prolonged QT interval 10/29/2017   Sensorineural hearing loss (SNHL) of both ears 01/31/2017   Dysphonia 10/03/2016   Laryngopharyngeal reflux (LPR) 10/03/2016   Nasal polyps 10/03/2016   Rhinitis medicamentosa 10/03/2016   Chronic laryngitis 10/03/2016   Hoarseness 08/31/2016   Moderate persistent asthma 08/02/2016   COPD (chronic obstructive pulmonary disease) (  HCC) 08/02/2016   Morbid (severe) obesity due to excess calories (HCC) 08/02/2016   Community acquired pneumonia 03/06/2016   Essential hypertension 03/06/2016   Mixed hyperlipidemia 03/06/2016   Right thyroid nodule 03/06/2016   Seizures (HCC) 03/06/2016   Fever 03/12/2015   Recurrent erosion of cornea 06/23/2011    Current Outpatient Medications:    acetaminophen (TYLENOL) 500 MG  tablet, Take 1,000 mg by mouth at bedtime., Disp: , Rfl:    busPIRone (BUSPAR) 5 MG tablet, Take 5 mg by mouth 2 (two) times daily., Disp: , Rfl:    Carboxymethylcellulose Sod PF 0.5 % SOLN, Place 1 drop into both eyes daily as needed (dry eyes)., Disp: , Rfl:    Cholecalciferol (VITAMIN D3) 25 MCG (1000 UT) CAPS, Take 1,000 Units by mouth daily., Disp: , Rfl:    escitalopram (LEXAPRO) 20 MG tablet, Take 20 mg by mouth at bedtime., Disp: , Rfl:    finasteride (PROSCAR) 5 MG tablet, TAKE 1 TABLET (5 MG TOTAL) BY MOUTH DAILY AT 12 NOON., Disp: 90 tablet, Rfl: 1   fluticasone (FLONASE) 50 MCG/ACT nasal spray, Place 1 spray into both nostrils 2 (two) times daily., Disp: , Rfl:    JARDIANCE 10 MG TABS tablet, TAKE 1 TABLET BY MOUTH DAILY BEFORE BREAKFAST., Disp: 90 tablet, Rfl: 3   loratadine (CLARITIN) 10 MG tablet, Take 10 mg by mouth every morning., Disp: , Rfl:    losartan (COZAAR) 25 MG tablet, TAKE 1/2 TABLET BY MOUTH EVERY DAY, Disp: 45 tablet, Rfl: 3   metoprolol succinate (TOPROL-XL) 100 MG 24 hr tablet, Take 1 tablet (100 mg total) by mouth 2 (two) times daily. Take with or immediately following a meal., Disp: 180 tablet, Rfl: 3   Multiple Vitamin (MULTIVITAMIN WITH MINERALS) TABS tablet, Take 1 tablet by mouth every morning., Disp: , Rfl:    PHENobarbital (LUMINAL) 97.2 MG tablet, Take 97.2 mg by mouth every evening. Takes between 3:00-5:00pm., Disp: , Rfl:    potassium chloride SA (KLOR-CON M) 20 MEQ tablet, Take 1 tablet (20 mEq total) by mouth daily., Disp: 90 tablet, Rfl: 3   pravastatin (PRAVACHOL) 40 MG tablet, Take 40 mg by mouth at bedtime. , Disp: , Rfl:    spironolactone (ALDACTONE) 25 MG tablet, Take 0.5 tablets (12.5 mg total) by mouth daily., Disp: 45 tablet, Rfl: 2   torsemide (DEMADEX) 20 MG tablet, TAKE 3 TABLETS BY MOUTH EVERY DAY, Disp: 270 tablet, Rfl: 0   triamcinolone cream (KENALOG) 0.1 %, Apply 1 application  topically 2 (two) times daily as needed (rash)., Disp: , Rfl:     warfarin (COUMADIN) 5 MG tablet, Take 5 mg by mouth at bedtime., Disp: , Rfl:  Allergies  Allergen Reactions   Dilaudid [Hydromorphone Hcl] Other (See Comments)    Makes pt hyper    Hydromorphone Other (See Comments)    hyperactiviity Other reaction(s): made him wild Other reaction(s): Unknown   Morphine Sulfate     Other reaction(s): at high dose causes confusion Other reaction(s): at high dose causes confusion Other reaction(s): at high dose causes confusion, Other (See Comments) Other reaction(s): at high dose causes confusion Other reaction(s): at high dose causes confusion    Tegretol [Carbamazepine] Hives   Carbamazepine Hives, Rash and Other (See Comments)    Other reaction(s): hives Other reaction(s): Hives Other reaction(s): hives     Social History   Socioeconomic History   Marital status: Married    Spouse name: Not on file   Number of children: Not  on file   Years of education: Not on file   Highest education level: Not on file  Occupational History   Not on file  Tobacco Use   Smoking status: Former    Packs/day: 3.00    Years: 48.00    Additional pack years: 0.00    Total pack years: 144.00    Types: Cigarettes    Quit date: 2000    Years since quitting: 24.4   Smokeless tobacco: Never  Vaping Use   Vaping Use: Never used  Substance and Sexual Activity   Alcohol use: Never   Drug use: Never   Sexual activity: Never  Other Topics Concern   Not on file  Social History Narrative   ** Merged History Encounter **       Social Determinants of Health   Financial Resource Strain: Medium Risk (10/26/2021)   Overall Financial Resource Strain (CARDIA)    Difficulty of Paying Living Expenses: Somewhat hard  Food Insecurity: No Food Insecurity (02/04/2022)   Hunger Vital Sign    Worried About Running Out of Food in the Last Year: Never true    Ran Out of Food in the Last Year: Never true  Transportation Needs: Unmet Transportation Needs (02/04/2022)    PRAPARE - Administrator, Civil Service (Medical): Yes    Lack of Transportation (Non-Medical): No  Physical Activity: Inactive (12/15/2019)   Exercise Vital Sign    Days of Exercise per Week: 0 days    Minutes of Exercise per Session: 0 min  Stress: Stress Concern Present (09/26/2018)   [employer] of Occupational Health - Occupational Stress Questionnaire    Feeling of Stress : To some extent  Social Connections: Not on file  Intimate Partner Violence: Not At Risk (02/04/2022)   Humiliation, Afraid, Rape, and Kick questionnaire    Fear of Current or Ex-Partner: No    Emotionally Abused: No    Physically Abused: No    Sexually Abused: No    Physical Exam      Future Appointments  Date Time Provider Department Center  08/25/2022  7:00 AM [facility] DEVICE REMOTES [facility]  08/25/2022  1:15 PM Lenn Sink, DPM [facility]  08/28/2022 10:00 AM Quintella Reichert, MD [facility]  09/04/2022  1:30 PM [facility] PA/NP [facility] None  09/05/2022  9:15 AM Swinyer, Zachary George, NP [facility]  09/26/2022  1:30 PM [facility] None  11/24/2022  7:00 AM [facility] DEVICE [facility]OTES [facility]  11/24/2022  3:00 PM Jake Bathe, MD [facility]  02/23/2023  7:00 AM [facility]  05/25/2023  7:00 AM [facility] DEVICE REMOTES [facility]  08/24/2023  7:00 AM [facility]

## 2022-08-23 NOTE — Telephone Encounter (Signed)
Called HF triage and left a VM regarding Mr.Mccroskey's visit to the ER yesterday. EDP is concerned with over diuresis due to his near syncope and hypovolemia yesterday. I am going to see him in the home today and wanted to follow up prior to setting up his weekly meds to see if any adjustments should be made with his meds. Forwarded to triage for follow up.   Maralyn Sago, EMT-Paramedic [phone] 08/23/2022

## 2022-08-25 ENCOUNTER — Ambulatory Visit (INDEPENDENT_AMBULATORY_CARE_PROVIDER_SITE_OTHER): Payer: No Typology Code available for payment source | Admitting: Podiatry

## 2022-08-25 ENCOUNTER — Ambulatory Visit (INDEPENDENT_AMBULATORY_CARE_PROVIDER_SITE_OTHER): Payer: No Typology Code available for payment source

## 2022-08-25 ENCOUNTER — Encounter: Payer: Self-pay | Admitting: Podiatry

## 2022-08-25 DIAGNOSIS — I420 Dilated cardiomyopathy: Secondary | ICD-10-CM

## 2022-08-25 DIAGNOSIS — I5022 Chronic systolic (congestive) heart failure: Secondary | ICD-10-CM

## 2022-08-25 DIAGNOSIS — M79675 Pain in left toe(s): Secondary | ICD-10-CM

## 2022-08-25 DIAGNOSIS — B351 Tinea unguium: Secondary | ICD-10-CM

## 2022-08-25 DIAGNOSIS — M79674 Pain in right toe(s): Secondary | ICD-10-CM

## 2022-08-25 LAB — CUP PACEART REMOTE DEVICE CHECK
Battery Remaining Longevity: 57 mo
Battery Remaining Percentage: 89 %
Battery Voltage: 2.98 V
Date Time Interrogation Session: 20240621020043
HighPow Impedance: 68 Ohm
Implantable Lead Connection Status: 753985
Implantable Lead Connection Status: 753985
Implantable Lead Connection Status: 753985
Implantable Lead Implant Date: 20231201
Implantable Lead Implant Date: 20231201
Implantable Lead Implant Date: 20231201
Implantable Lead Location: 753858
Implantable Lead Location: 753859
Implantable Lead Location: 753860
Implantable Pulse Generator Implant Date: 20231201
Lead Channel Impedance Value: 440 Ohm
Lead Channel Impedance Value: 450 Ohm
Lead Channel Impedance Value: 580 Ohm
Lead Channel Pacing Threshold Amplitude: 0.75 V
Lead Channel Pacing Threshold Amplitude: 2 V
Lead Channel Pacing Threshold Pulse Width: 0.5 ms
Lead Channel Pacing Threshold Pulse Width: 1 ms
Lead Channel Sensing Intrinsic Amplitude: 0.6 mV
Lead Channel Sensing Intrinsic Amplitude: 11.7 mV
Lead Channel Setting Pacing Amplitude: 3.5 V
Lead Channel Setting Pacing Amplitude: 4 V
Lead Channel Setting Pacing Pulse Width: 0.5 ms
Lead Channel Setting Pacing Pulse Width: 1 ms
Lead Channel Setting Sensing Sensitivity: 0.5 mV
Pulse Gen Serial Number: 211010361
Zone Setting Status: 755011

## 2022-08-28 ENCOUNTER — Telehealth: Payer: Self-pay | Admitting: *Deleted

## 2022-08-28 ENCOUNTER — Ambulatory Visit: Payer: No Typology Code available for payment source | Attending: Cardiology | Admitting: Cardiology

## 2022-08-28 ENCOUNTER — Encounter: Payer: Self-pay | Admitting: Cardiology

## 2022-08-28 VITALS — BP 100/60 | HR 51 | Ht 68.0 in | Wt 249.8 lb

## 2022-08-28 DIAGNOSIS — I10 Essential (primary) hypertension: Secondary | ICD-10-CM

## 2022-08-28 DIAGNOSIS — G4733 Obstructive sleep apnea (adult) (pediatric): Secondary | ICD-10-CM

## 2022-08-28 NOTE — Progress Notes (Signed)
Sleep Medicine CONSULT Note    Date:  08/28/2022   ID:  Ruben Murray, DOB [birth year], MRN [id number]  PCP:  Aliene Beams, MD  Cardiologist: Donato Schultz, MD   No chief complaint on file.   History of Present Illness:  Ruben Murray is a 77 y.o. male who is being seen today for the evaluation of obstructive sleep apnea at the request of Arvilla Meres, MD.  This is a 77 year old male with a history of aortic atherosclerosis, chronic systolic CHF, hypertension, hyperlipidemia, MR, morbid obesity, nonischemic cardiomyopathy, permanent atrial fibrillation as well as obstructive sleep apnea on CPAP.  He recently saw Dr. Gala Romney was not using his CPAP because he sleeps in a recliner and he said he was told that he does not need CPAP if he sleeps in a recliner.  He is now referred for sleep medicine evaluation.  He tells me that he was dx with OSA years ago and was on CPAP followed by Dr. Earl Gala.  He was sleeping in a reliner and he told him that he did not have to use CPAP if he continued to sleep in a recliner.  He now has transitioned to a hospital bed but increases the incline in the bed to sleep.  His wife told him that he was not snoring when he was sleeping in the recliner.  He feels rested in the am for the most part but does get sleepy during the day and takes naps during the day. He does not wake up gasping for breath or wake up snoring. Stop Bang score is 7.  Past Medical History:  Diagnosis Date   Acquired thrombophilia (HCC) 01/15/2019   Aortic atherosclerosis (HCC)    Aortic root dilatation (HCC)    CAP (community acquired pneumonia) 03/06/2016   Chronic laryngitis 10/03/2016   Chronic systolic CHF (congestive heart failure) (HCC)    COPD GOLD II  08/02/2016   Quit smoking 2000  PFT's  07/10/2016  FEV1 1.98 (70 % ) ratio 67  p 19 % improvement from saba p nothing  prior to study while of coreg so rec as of 08/02/2016 try off coreg and on bisoprolol      Essential  hypertension 03/06/2016   Changed from coreg to bisoprolol due to copd with reversible component  08/02/2016 >>>    Hoarseness 08/31/2016   Referred to ent 08/31/2016 >>> seen 10/03/16 Jenne Pane dx gerd and rhinitis medicamentosa   > improved on f/u 01/31/17 on gerd rx/ flonase and off afrin   Hyperlipidemia    Hypertension    Laryngopharyngeal reflux (LPR) 10/03/2016   Mitral regurgitation    Moderate persistent asthma 08/02/2016   Morbid (severe) obesity due to excess calories (HCC) 08/02/2016   Nasal polyps 10/03/2016   NICM (nonischemic cardiomyopathy) (HCC)    a. EF 40-45% in 04/2016, etiology not defined, managed medically.   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   Rhinitis medicamentosa 10/03/2016   Right thyroid nodule 03/06/2016   Seizures (HCC)    "take RX daily" (03/06/2016)   Sensorineural hearing loss (SNHL) of both ears 01/31/2017   Tricuspid regurgitation     Past Surgical History:  Procedure Laterality Date   BIV ICD INSERTION CRT-D N/A 02/03/2022   Procedure: BIV ICD INSERTION CRT-D;  Surgeon: Regan Lemming, MD;  Location: [facility] INVASIVE CV LAB;  Service:  Cardiovascular;  Laterality: N/A;   CARDIOVERSION N/A 12/13/2017   Procedure: CARDIOVERSION;  Surgeon: Chilton Si, MD;  Location: [facility] ENDOSCOPY;  Service: Cardiovascular;  Laterality: N/A;   CARDIOVERSION N/A 09/25/2018   Procedure: CARDIOVERSION;  Surgeon: Chrystie Nose, MD;  Location: [facility] ENDOSCOPY;  Service: Cardiovascular;  Laterality: N/A;   CARDIOVERSION N/A 11/04/2018   Procedure: CARDIOVERSION;  Surgeon: Vesta Mixer, MD;  Location: [facility] ENDOSCOPY;  Service: Cardiovascular;  Laterality: N/A;   CARDIOVERSION N/A 04/11/2019   Procedure: CARDIOVERSION;  Surgeon: Lewayne Bunting, MD;  Location: [facility] ENDOSCOPY;  Service: Cardiovascular;  Laterality: N/A;   CIRCUMCISION     JOINT REPLACEMENT     PERCUTANEOUS  PINNING TOE FRACTURE Left    "big toe   REPLACEMENT TOTAL KNEE Left    RIGHT/LEFT HEART CATH AND CORONARY ANGIOGRAPHY N/A 11/06/2019   Procedure: RIGHT/LEFT HEART CATH AND CORONARY ANGIOGRAPHY;  Surgeon: Lennette Bihari, MD;  Location: [facility] INVASIVE CV LAB;  Service: Cardiovascular;  Laterality: N/A;   TONSILLECTOMY AND ADENOIDECTOMY      Current Medications: Current Meds  Medication Sig   acetaminophen (TYLENOL) 500 MG tablet Take 1,000 mg by mouth at bedtime.   busPIRone (BUSPAR) 5 MG tablet Take 5 mg by mouth 2 (two) times daily.   Carboxymethylcellulose Sod PF 0.5 % SOLN Place 1 drop into both eyes daily as needed (dry eyes).   Cholecalciferol (VITAMIN D3) 25 MCG (1000 UT) CAPS Take 1,000 Units by mouth daily.   escitalopram (LEXAPRO) 20 MG tablet Take 20 mg by mouth at bedtime.   finasteride (PROSCAR) 5 MG tablet TAKE 1 TABLET (5 MG TOTAL) BY MOUTH DAILY AT 12 NOON.   fluticasone (FLONASE) 50 MCG/ACT nasal spray Place 1 spray into both nostrils 2 (two) times daily.   JARDIANCE 10 MG TABS tablet TAKE 1 TABLET BY MOUTH DAILY BEFORE BREAKFAST.   loratadine (CLARITIN) 10 MG tablet Take 10 mg by mouth every morning.   losartan (COZAAR) 25 MG tablet TAKE 1/2 TABLET BY MOUTH EVERY DAY   metoprolol succinate (TOPROL-XL) 100 MG 24 hr tablet Take 1 tablet (100 mg total) by mouth 2 (two) times daily. Take with or immediately following a meal.   Multiple Vitamin (MULTIVITAMIN WITH MINERALS) TABS tablet Take 1 tablet by mouth every morning.   PHENobarbital (LUMINAL) 97.2 MG tablet Take 97.2 mg by mouth every evening. Takes between 3:00-5:00pm.   potassium chloride SA (KLOR-CON M) 20 MEQ tablet Take 1 tablet (20 mEq total) by mouth daily.   pravastatin (PRAVACHOL) 40 MG tablet Take 40 mg by mouth at bedtime.    spironolactone (ALDACTONE) 25 MG tablet Take 0.5 tablets (12.5 mg total) by mouth daily.   torsemide (DEMADEX) 20 MG tablet TAKE 3 TABLETS BY MOUTH EVERY DAY   triamcinolone cream (KENALOG) 0.1  % Apply 1 application  topically 2 (two) times daily as needed (rash).   warfarin (COUMADIN) 5 MG tablet Take 5 mg by mouth at bedtime.    Allergies:   Dilaudid [hydromorphone hcl], Hydromorphone, Morphine sulfate, Tegretol [carbamazepine], and Carbamazepine   Social History   Socioeconomic History   Marital status: Married    Spouse name: Not on file   Number of children: Not on file   Years of education: Not on file   Highest education level: Not on file  Occupational History   Not on file  Tobacco Use   Smoking status: Former    Packs/day: 3.00    Years: 48.00    Additional pack years:  0.00    Total pack years: 144.00    Types: Cigarettes    Quit date: 2000    Years since quitting: 24.4   Smokeless tobacco: Never  Vaping Use   Vaping Use: Never used  Substance and Sexual Activity   Alcohol use: Never   Drug use: Never   Sexual activity: Never  Other Topics Concern   Not on file  Social History Narrative   ** Merged History Encounter **       Social Determinants of Health   Financial Resource Strain: Medium Risk (10/26/2021)   Overall Financial Resource Strain (CARDIA)    Difficulty of Paying Living Expenses: Somewhat hard  Food Insecurity: No Food Insecurity (02/04/2022)   Hunger Vital Sign    Worried About Running Out of Food in the Last Year: Never true    Ran Out of Food in the Last Year: Never true  Transportation Needs: Unmet Transportation Needs (02/04/2022)   PRAPARE - Administrator, Civil Service (Medical): Yes    Lack of Transportation (Non-Medical): No  Physical Activity: Inactive (12/15/2019)   Exercise Vital Sign    Days of Exercise per Week: 0 days    Minutes of Exercise per Session: 0 min  Stress: Stress Concern Present (09/26/2018)   [employer] of Occupational Health - Occupational Stress Questionnaire    Feeling of Stress : To some extent  Social Connections: Not on file     Family History:  The patient's family history  includes Hypertension in his mother; Other in his father.   ROS:   Please see the history of present illness.    ROS All other systems reviewed and are negative.     06/01/2016   11:34 AM  PAD Screen  Previous PAD dx? No  Previous surgical procedure? No  Pain with walking? Yes  Subsides with rest? Yes  Feet/toe relief with dangling? Yes  Painful, non-healing ulcers? Yes  Extremities discolored? No       PHYSICAL EXAM:   VS:  BP 100/60   Pulse (!) 51   Ht 5\' 8"  (1.727 m)   Wt 249 lb 12.8 oz (113.3 kg)   SpO2 98%   BMI 37.98 kg/m    GEN: Well nourished, well developed, in no acute distress  HEENT: normal  Neck: no JVD, carotid bruits, or masses Cardiac: RRR; no murmurs, rubs, or gallops,no edema.  Intact distal pulses bilaterally.  Respiratory:  clear to auscultation bilaterally, normal work of breathing GI: soft, nontender, nondistended, + BS MS: no deformity or atrophy  Skin: warm and dry, no rash Neuro:  Alert and Oriented x 3, Strength and sensation are intact Psych: euthymic mood, full affect  Wt Readings from Last 3 Encounters:  08/28/22 249 lb 12.8 oz (113.3 kg)  08/23/22 246 lb 9.6 oz (111.9 kg)  08/16/22 249 lb (112.9 kg)      Studies/Labs Reviewed:   none  Recent Labs: 06/21/2022: B Natriuretic Peptide 199.5 08/22/2022: ALT 30; BUN 25; Creatinine, Ser 1.25; Hemoglobin 15.8; Magnesium 2.5; Platelets 163; Potassium 4.4; Sodium 140   Additional studies/ records that were reviewed today include:  none    ASSESSMENT:    1. OSA (obstructive sleep apnea)   2. Benign essential HTN      PLAN:  In order of problems listed above:  OSA  -Patient has a history of obstructive sleep apnea but has not been using it as he says he was told if he sleeps in a recliner  he does not need it -He has multiple cardiac comorbidities and therefore if he does have significant obstructive sleep apnea that should be treated -His Stop Bang score is 7 -I will get a home  sleep study with his sleeping in his usual environment sleeping in his hospital bed with an incline  2.  Hypertension -BP controlled on exam today -Continue prescription drug management with losartan 12.5 mg daily and spironolactone 12.5 mg daily with as needed refills  Time Spent: 20 minutes total time of encounter, including 15 minutes spent in face-to-face patient care on the date of this encounter. This time includes coordination of care and counseling regarding above mentioned problem list. Remainder of non-face-to-face time involved reviewing chart documents/testing relevant to the patient encounter and documentation in the medical record. I have independently reviewed documentation from referring provider  Medication Adjustments/Labs and Tests Ordered: Current medicines are reviewed at length with the patient today.  Concerns regarding medicines are outlined above.  Medication changes, Labs and Tests ordered today are listed in the Patient Instructions below.  There are no Patient Instructions on file for this visit.   Signed, Armanda Magic, MD  08/28/2022 10:17 AM    [facility] [address] [place], [place], [place]  [address] Phone: [phone]; Fax: [phone]

## 2022-08-28 NOTE — Telephone Encounter (Signed)
Dr. Mayford Knife ordered an Itamar study. Pt agreeable to signed waiver and not open the box until he has been called with the Pin #.

## 2022-08-28 NOTE — Progress Notes (Signed)
Subjective:   Patient ID: Ruben Murray, male   DOB: 77 y.o.   MRN: [id number]   HPI Patient presents with elongated nailbeds 1-5 both feet that have been very thick and painful and he cannot take care of them and presents with caregiver today who also cannot do the procedure   ROS      Objective:  Physical Exam  Neuro ocular status intact thick yellow brittle nailbeds 1-5 both feet     Assessment:  Chronic mycotic nail infection 1-5 both feet that are painful     Plan:  Debrided nailbeds 1-5 both feet no angiogenic bleeding reappoint routine care

## 2022-08-28 NOTE — Addendum Note (Signed)
Addended by: Luellen Pucker on: 08/28/2022 10:22 AM   Modules accepted: Orders

## 2022-08-28 NOTE — Patient Instructions (Addendum)
Medication Instructions:  Your physician recommends that you continue on your current medications as directed. Please refer to the Current Medication list given to you today.  *If you need a refill on your cardiac medications before your next appointment, please call your pharmacy*   Lab Work: None.  If you have labs (blood work) drawn today and your tests are completely normal, you will receive your results only by: MyChart Message (if you have MyChart) OR A paper copy in the mail If you have any lab test that is abnormal or we need to change your treatment, we will call you to review the results.   Testing/Procedures: Your physician has recommended that you have a sleep study. This test records several body functions during sleep, including: brain activity, eye movement, oxygen and carbon dioxide blood levels, heart rate and rhythm, breathing rate and rhythm, the flow of air through your mouth and nose, snoring, body muscle movements, and chest and belly movement.    Follow-Up:  We recommend signing up for the patient portal called "MyChart".  Sign up information is provided on this After Visit Summary.  MyChart is used to connect with patients for Virtual Visits (Telemedicine).  Patients are able to view lab/test results, encounter notes, upcoming appointments, etc.  Non-urgent messages can be sent to your provider as well.   To learn more about what you can do with MyChart, go to [email or web address].    Your next appointment will be dependent on the results of your sleep study and it will be with:  Provider:   Dr. Armanda Magic, MD

## 2022-08-30 ENCOUNTER — Other Ambulatory Visit (HOSPITAL_COMMUNITY): Payer: Self-pay

## 2022-08-30 NOTE — Progress Notes (Unsigned)
Paramedicine Encounter    Patient ID: Ruben Murray, male    DOB: 06-15-1945, 77 y.o.   MRN: [id number]   Patient Care Team: Aliene Beams, MD as PCP - General (Family Medicine) Jake Bathe, MD as PCP - Cardiology (Cardiology) Regan Lemming, MD as PCP - Electrophysiology (Cardiology) Aliene Beams, MD (Family Medicine) [place] [place] [facility] as Case Manager (Hospice and Palliative Medicine)  Patient Active Problem List   Diagnosis Date Noted   CHF (congestive heart failure) (HCC) 02/03/2022   Atrial fibrillation (HCC) 11/02/2021   Long term (current) use of anticoagulants 11/02/2021   AKI (acute kidney injury) (HCC) 07/27/2021   Acute pulmonary embolism (HCC) 07/26/2021   Acute on chronic systolic CHF (congestive heart failure) (HCC) 05/17/2021   Hypercoagulable state (HCC) 05/10/2021   Left leg pain 04/24/2021   OSA on CPAP 04/24/2021   Mitral regurgitation 04/24/2021   Tricuspid regurgitation 04/24/2021   Dilated aortic root (HCC) 04/24/2021   Permanent atrial fibrillation (HCC) 04/19/2021   Chronic arthropathy 04/13/2020   Callus 04/13/2020   Onychomycosis 12/12/2019   Left leg cellulitis 12/12/2019   Chronic systolic heart failure (HCC) 12/09/2019   Dilated cardiomyopathy (HCC)    Atrial flutter (HCC) 11/03/2019   Atrial flutter with rapid ventricular response (HCC) 11/03/2019   Acquired thrombophilia (HCC) 01/15/2019   Atrial fibrillation with rapid ventricular response (HCC) 10/29/2017   Chronic combined systolic and diastolic heart failure (HCC) 10/29/2017   Prolonged QT interval 10/29/2017   Sensorineural hearing loss (SNHL) of both ears 01/31/2017   Dysphonia 10/03/2016   Laryngopharyngeal reflux (LPR) 10/03/2016   Nasal polyps 10/03/2016   Rhinitis medicamentosa 10/03/2016   Chronic laryngitis 10/03/2016   Hoarseness 08/31/2016   Moderate persistent asthma 08/02/2016   COPD (chronic obstructive pulmonary disease) (HCC) 08/02/2016   Morbid  (severe) obesity due to excess calories (HCC) 08/02/2016   Community acquired pneumonia 03/06/2016   Essential hypertension 03/06/2016   Mixed hyperlipidemia 03/06/2016   Right thyroid nodule 03/06/2016   Seizures (HCC) 03/06/2016   Fever 03/12/2015   Recurrent erosion of cornea 06/23/2011    Current Outpatient Medications:    acetaminophen (TYLENOL) 500 MG tablet, Take 1,000 mg by mouth at bedtime., Disp: , Rfl:    busPIRone (BUSPAR) 5 MG tablet, Take 5 mg by mouth 2 (two) times daily., Disp: , Rfl:    Carboxymethylcellulose Sod PF 0.5 % SOLN, Place 1 drop into both eyes daily as needed (dry eyes)., Disp: , Rfl:    Cholecalciferol (VITAMIN D3) 25 MCG (1000 UT) CAPS, Take 1,000 Units by mouth daily., Disp: , Rfl:    escitalopram (LEXAPRO) 20 MG tablet, Take 20 mg by mouth at bedtime., Disp: , Rfl:    finasteride (PROSCAR) 5 MG tablet, TAKE 1 TABLET (5 MG TOTAL) BY MOUTH DAILY AT 12 NOON., Disp: 90 tablet, Rfl: 1   fluticasone (FLONASE) 50 MCG/ACT nasal spray, Place 1 spray into both nostrils 2 (two) times daily., Disp: , Rfl:    JARDIANCE 10 MG TABS tablet, TAKE 1 TABLET BY MOUTH DAILY BEFORE BREAKFAST., Disp: 90 tablet, Rfl: 3   loratadine (CLARITIN) 10 MG tablet, Take 10 mg by mouth every morning., Disp: , Rfl:    losartan (COZAAR) 25 MG tablet, TAKE 1/2 TABLET BY MOUTH EVERY DAY, Disp: 45 tablet, Rfl: 3   metoprolol succinate (TOPROL-XL) 100 MG 24 hr tablet, Take 1 tablet (100 mg total) by mouth 2 (two) times daily. Take with or immediately following a meal., Disp: 180 tablet, Rfl:  3   Multiple Vitamin (MULTIVITAMIN WITH MINERALS) TABS tablet, Take 1 tablet by mouth every morning., Disp: , Rfl:    PHENobarbital (LUMINAL) 97.2 MG tablet, Take 97.2 mg by mouth every evening. Takes between 3:00-5:00pm., Disp: , Rfl:    potassium chloride SA (KLOR-CON M) 20 MEQ tablet, Take 1 tablet (20 mEq total) by mouth daily., Disp: 90 tablet, Rfl: 3   pravastatin (PRAVACHOL) 40 MG tablet, Take 40 mg by  mouth at bedtime. , Disp: , Rfl:    spironolactone (ALDACTONE) 25 MG tablet, Take 0.5 tablets (12.5 mg total) by mouth daily., Disp: 45 tablet, Rfl: 2   torsemide (DEMADEX) 20 MG tablet, TAKE 3 TABLETS BY MOUTH EVERY DAY, Disp: 270 tablet, Rfl: 0   triamcinolone cream (KENALOG) 0.1 %, Apply 1 application  topically 2 (two) times daily as needed (rash)., Disp: , Rfl:    warfarin (COUMADIN) 5 MG tablet, Take 5 mg by mouth at bedtime., Disp: , Rfl:  Allergies  Allergen Reactions   Dilaudid [Hydromorphone Hcl] Other (See Comments)    Makes pt hyper    Hydromorphone Other (See Comments)    hyperactiviity Other reaction(s): made him wild Other reaction(s): Unknown   Morphine Sulfate     Other reaction(s): at high dose causes confusion Other reaction(s): at high dose causes confusion Other reaction(s): at high dose causes confusion, Other (See Comments) Other reaction(s): at high dose causes confusion Other reaction(s): at high dose causes confusion    Tegretol [Carbamazepine] Hives   Carbamazepine Hives, Rash and Other (See Comments)    Other reaction(s): hives Other reaction(s): Hives Other reaction(s): hives     Social History   Socioeconomic History   Marital status: Married    Spouse name: Not on file   Number of children: Not on file   Years of education: Not on file   Highest education level: Not on file  Occupational History   Not on file  Tobacco Use   Smoking status: Former    Packs/day: 3.00    Years: 48.00    Additional pack years: 0.00    Total pack years: 144.00    Types: Cigarettes    Quit date: 2000    Years since quitting: 24.5   Smokeless tobacco: Never  Vaping Use   Vaping Use: Never used  Substance and Sexual Activity   Alcohol use: Never   Drug use: Never   Sexual activity: Never  Other Topics Concern   Not on file  Social History Narrative   ** Merged History Encounter **       Social Determinants of Health   Financial Resource Strain:  Medium Risk (10/26/2021)   Overall Financial Resource Strain (CARDIA)    Difficulty of Paying Living Expenses: Somewhat hard  Food Insecurity: No Food Insecurity (02/04/2022)   Hunger Vital Sign    Worried About Running Out of Food in the Last Year: Never true    Ran Out of Food in the Last Year: Never true  Transportation Needs: Unmet Transportation Needs (02/04/2022)   PRAPARE - Administrator, Civil Service (Medical): Yes    Lack of Transportation (Non-Medical): No  Physical Activity: Inactive (12/15/2019)   Exercise Vital Sign    Days of Exercise per Week: 0 days    Minutes of Exercise per Session: 0 min  Stress: Stress Concern Present (09/26/2018)   [employer] of Occupational Health - Occupational Stress Questionnaire    Feeling of Stress : To some extent  Social Connections:  Not on file  Intimate Partner Violence: Not At Risk (02/04/2022)   Humiliation, Afraid, Rape, and Kick questionnaire    Fear of Current or Ex-Partner: No    Emotionally Abused: No    Physically Abused: No    Sexually Abused: No    Physical Exam      Future Appointments  Date Time Provider Department Center  09/04/2022  1:30 PM [facility] PA/NP [facility] None  09/05/2022  9:15 AM Swinyer, Zachary George, NP [facility]  09/26/2022  1:30 PM [facility] None  11/24/2022  7:00 AM [facility] DEVICE REMOTES [facility]  11/24/2022  1:15 PM Lenn Sink, DPM [facility]  11/24/2022  3:00 PM Jake Bathe, MD [facility]  02/23/2023  7:00 AM [facility] DEVICE REMOTES [facility]  05/25/2023  7:00 AM [facility] DEVICE REMOTES [facility]  08/24/2023  7:00 AM [facility] DEVICE REMOTES [facility]     ACTION: {Paramed Action:[id number]}

## 2022-08-31 DIAGNOSIS — H903 Sensorineural hearing loss, bilateral: Secondary | ICD-10-CM | POA: Diagnosis not present

## 2022-09-01 NOTE — Progress Notes (Signed)
Advanced Heart Failure Clinic Note  Primary Care: Aliene Beams, MD HF Cardiologist: Dr. Gala Romney EP: Dr Elberta Fortis.   HPI: Mr Graser is a 77 y.o.with HFrEF d/t NICM, permanent A fib, pulmonary HTN,  OSA, seizure d/o, COPD and HTN   Echo 2018 EF 40-45%  Lost to cardiology f/u 2022.    Admitted  2/23 for ADHF in setting of rapid AF. Echo EF 20-25%, RV mildly reduced, RVSP 48 mmHg, mild MR/TR. He was diuresed w/ IV Lasix. Afib treated w/ rate control, w/ metoprolol and digoxin.    He was seen by Dr. Elberta Fortis . Plan is for CRT-D implant w/ plans to consider AV node ablation if further issues w/ rapid afib.    Had initial TOC visit 05/04/2021. HF meds adjusted, but he did not start them.    Admitted 3/23 with AF RVR and A/C HFrEF.  Prior to admit he never picked up digoxin, Toprol, or Jardiance. Diuresed with IV lasix.  Discharged on lopressor 100 bid, digoxin 0.25 , and torsemide 40 mg bid. Discharge weight 214 pounds.   Seen in TOC  again in 3/23, volume trending up. Torsemide increased and spiro started. Referred to paramedicine.  Readmitted 5/23 with AFib with RVR, HR 150's, INR 3.8. CTA chest with small PE w/ R heart strain, also with volume overload. Echo with EF 20%. No DOAC due to hx of seizures and phenobarbital use.  AFib controlled with metoprolol 100 bid and digoxin (dig previously stopped due to elevated level). PMT consulted for GOC, and he remained DNR. He was discharged home with HH, weight 228 lbs.  S/P Abbott BiV ICDm12/23 (AV node not ablated).   Today he returns for AHF follow up with paramedicine team. Overall feeling fair. Denies palpitations, CP, dizziness, edema, or PND/Orthopnea. Chronic SOB with ambulation. Appetite ok. No fever or chills. Weight at home 248 pounds. Sits around and watches TV in the house. Sleeps in hospital bed at an angle with 1 pillow, says its due to back problems not because he can't breath. Taking all medications. Denies ETOH, smoking.     Cardiac Testing  Echo (4/24) EF 20-25% RV moderately reduced - Echo (5/23): EF 20%, RV moderately down, moderate to severe TR, mild MR (he was in AF and volume overloaded). - Echo (2/23): EF 20-25%, RV mildly reduced, RVSP 48, mild MR, mild TR  - L/RHC (9/21): Normal coronaries, mild pulmonary hypertension; PA 34/22, mean PA pressure 28 mmHg  - Echo (8/21): EF 20-25%, RV moderately reduced, RVSP 38, Mild MR, mild TR - Echo (1/20): EF 40-45%, RV mildly dilated, systolic fx normal  - Echo (8/19): EF 25%, RV moderately reduced (in Afib) - Echo (2/18): EF 40-45%, RV normal, mod TR  Past Medical History:  Diagnosis Date   Acquired thrombophilia (HCC) 01/15/2019   Aortic atherosclerosis (HCC)    Aortic root dilatation (HCC)    CAP (community acquired pneumonia) 03/06/2016   Chronic laryngitis 10/03/2016   Chronic systolic CHF (congestive heart failure) (HCC)    COPD GOLD II  08/02/2016   Quit smoking 2000  PFT's  07/10/2016  FEV1 1.98 (70 % ) ratio 67  p 19 % improvement from saba p nothing  prior to study while of coreg so rec as of 08/02/2016 try off coreg and on bisoprolol      Essential hypertension 03/06/2016   Changed from coreg to bisoprolol due to copd with reversible component  08/02/2016 >>>    Hoarseness 08/31/2016   Referred to ent  08/31/2016 >>> seen 10/03/16 Jenne Pane dx gerd and rhinitis medicamentosa   > improved on f/u 01/31/17 on gerd rx/ flonase and off afrin   Hyperlipidemia    Hypertension    Laryngopharyngeal reflux (LPR) 10/03/2016   Mitral regurgitation    Moderate persistent asthma 08/02/2016   Morbid (severe) obesity due to excess calories (HCC) 08/02/2016   Nasal polyps 10/03/2016   NICM (nonischemic cardiomyopathy) (HCC)    a. EF 40-45% in 04/2016, etiology not defined, managed medically.   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   Rhinitis medicamentosa 10/03/2016    Right thyroid nodule 03/06/2016   Seizures (HCC)    "take RX daily" (03/06/2016)   Sensorineural hearing loss (SNHL) of both ears 01/31/2017   Tricuspid regurgitation    Current Outpatient Medications  Medication Sig Dispense Refill   acetaminophen (TYLENOL) 500 MG tablet Take 1,000 mg by mouth at bedtime.     busPIRone (BUSPAR) 5 MG tablet Take 5 mg by mouth 2 (two) times daily.     Cholecalciferol (VITAMIN D3) 25 MCG (1000 UT) CAPS Take 1,000 Units by mouth daily.     escitalopram (LEXAPRO) 20 MG tablet Take 20 mg by mouth at bedtime.     finasteride (PROSCAR) 5 MG tablet TAKE 1 TABLET (5 MG TOTAL) BY MOUTH DAILY AT 12 NOON. 90 tablet 1   fluticasone (FLONASE) 50 MCG/ACT nasal spray Place 1 spray into both nostrils 2 (two) times daily.     JARDIANCE 10 MG TABS tablet TAKE 1 TABLET BY MOUTH DAILY BEFORE BREAKFAST. 90 tablet 3   loratadine (CLARITIN) 10 MG tablet Take 10 mg by mouth every morning.     losartan (COZAAR) 25 MG tablet TAKE 1/2 TABLET BY MOUTH EVERY DAY 45 tablet 3   metoprolol succinate (TOPROL-XL) 100 MG 24 hr tablet Take 1 tablet (100 mg total) by mouth 2 (two) times daily. Take with or immediately following a meal. 180 tablet 3   Multiple Vitamin (MULTIVITAMIN WITH MINERALS) TABS tablet Take 1 tablet by mouth every morning.     PHENobarbital (LUMINAL) 97.2 MG tablet Take 97.2 mg by mouth every evening. Takes between 3:00-5:00pm.     potassium chloride SA (KLOR-CON M) 20 MEQ tablet Take 1 tablet (20 mEq total) by mouth daily. 90 tablet 3   pravastatin (PRAVACHOL) 40 MG tablet Take 40 mg by mouth at bedtime.      spironolactone (ALDACTONE) 25 MG tablet Take 0.5 tablets (12.5 mg total) by mouth daily. 45 tablet 2   torsemide (DEMADEX) 20 MG tablet TAKE 3 TABLETS BY MOUTH EVERY DAY 270 tablet 0   warfarin (COUMADIN) 5 MG tablet Take 5 mg by mouth at bedtime.     Carboxymethylcellulose Sod PF 0.5 % SOLN Place 1 drop into both eyes daily as needed (dry eyes).     triamcinolone  cream (KENALOG) 0.1 % Apply 1 application  topically 2 (two) times daily as needed (rash). (Patient not taking: Reported on 09/04/2022)     No current facility-administered medications for this encounter.   Allergies  Allergen Reactions   Dilaudid [Hydromorphone Hcl] Other (See Comments)    Makes pt hyper    Hydromorphone Other (See Comments)    hyperactiviity Other reaction(s): made him wild Other reaction(s): Unknown   Morphine Sulfate     Other reaction(s): at high dose causes confusion Other reaction(s): at high dose  causes confusion Other reaction(s): at high dose causes confusion, Other (See Comments) Other reaction(s): at high dose causes confusion Other reaction(s): at high dose causes confusion    Tegretol [Carbamazepine] Hives   Carbamazepine Hives, Rash and Other (See Comments)    Other reaction(s): hives Other reaction(s): Hives Other reaction(s): hives   Social History   Socioeconomic History   Marital status: Married    Spouse name: Not on file   Number of children: Not on file   Years of education: Not on file   Highest education level: Not on file  Occupational History   Not on file  Tobacco Use   Smoking status: Former    Packs/day: 3.00    Years: 48.00    Additional pack years: 0.00    Total pack years: 144.00    Types: Cigarettes    Quit date: 2000    Years since quitting: 24.5   Smokeless tobacco: Never  Vaping Use   Vaping Use: Never used  Substance and Sexual Activity   Alcohol use: Never   Drug use: Never   Sexual activity: Never  Other Topics Concern   Not on file  Social History Narrative   ** Merged History Encounter **       Social Determinants of Health   Financial Resource Strain: Medium Risk (10/26/2021)   Overall Financial Resource Strain (CARDIA)    Difficulty of Paying Living Expenses: Somewhat hard  Food Insecurity: No Food Insecurity (02/04/2022)   Hunger Vital Sign    Worried About Running Out of Food in the Last Year:  Never true    Ran Out of Food in the Last Year: Never true  Transportation Needs: Unmet Transportation Needs (02/04/2022)   PRAPARE - Administrator, Civil Service (Medical): Yes    Lack of Transportation (Non-Medical): No  Physical Activity: Inactive (12/15/2019)   Exercise Vital Sign    Days of Exercise per Week: 0 days    Minutes of Exercise per Session: 0 min  Stress: Stress Concern Present (09/26/2018)   [employer] of Occupational Health - Occupational Stress Questionnaire    Feeling of Stress : To some extent  Social Connections: Not on file  Intimate Partner Violence: Not At Risk (02/04/2022)   Humiliation, Afraid, Rape, and Kick questionnaire    Fear of Current or Ex-Partner: No    Emotionally Abused: No    Physically Abused: No    Sexually Abused: No   Family History  Problem Relation Age of Onset   Hypertension Mother    Other Father        sudden cardiac arrest   BP 100/70 (BP Location: Left Arm, Patient Position: Standing)   Pulse 86   Ht 5\' 8"  (1.727 m)   Wt 114.8 kg (253 lb)   SpO2 94%   BMI 38.47 kg/m   Wt Readings from Last 3 Encounters:  09/04/22 114.8 kg (253 lb)  08/30/22 111.6 kg (246 lb)  08/28/22 113.3 kg (249 lb 12.8 oz)   PHYSICAL EXAM: General:  elderly appearing.  No respiratory difficulty. Arrived in [place] HEENT: normal Neck: supple. JVD flat. Carotids 2+ bilat; no bruits. No lymphadenopathy or thyromegaly appreciated. Cor: PMI nondisplaced. Regular rate & irregular rhythm. No rubs, gallops or murmurs. Lungs: clear, diminished bases Abdomen: soft, nontender, nondistended. No hepatosplenomegaly. No bruits or masses. Good bowel sounds. Extremities: no cyanosis, clubbing, rash, edema  Neuro: alert & oriented x 3, cranial nerves grossly intact. moves all 4 extremities w/o difficulty. Affect  pleasant.   Device interrogation today: 100% AT/AF, volume slightly up on corevue. 41 brief episodes of NSVT, 37 second SVT 08/26/22  EKG a fib  90s (Personally reviewed)    ASSESSMENT & PLAN: 1. Chronic Systolic Heart Failure - NICM. Dates back to at least 2018, EFs have fluctuated from 20-40% - most recent echo 2/22 EF 20-25%, RV moderately reduced - LHC in 2021 showed normal coronaries - suspect tachy mediated CM from long history of chronic, poorly rate controlled Afib/flutter  - He does not tolerate rapid Afib well, in setting of severe LV dysfunction  - S/P Abbott CRT-D  12/23 - Echo 06/21/22 EF 20-25%, RV moderately reduced - NYHA III, volume stable on exam - Continue losartan 12.5 mg daily. Sherryll Burger cost-prohibitive) - Continue torsemide 40 mg daily + 20 KDUR daily - Has been off digoxin 2/2 elevated levels 12/23 - Continue Jardiance 10 mg daily. No GU symptoms - Continue Toprol XL 100 mg bid. - Continue spiro 12.5 mg daily.   - BMET, BNP today   2. Permanent Atrial Fibrillation  - failed multiple cardioversions and AAD therapy . - issues w/ Rapid Afib in the past, complicating HF - Rate control w/ metoprolol. Amiodarone stopped by Dr. Elberta Fortis 2/24 - Followed by Dr. Elberta Fortis pending CRT-D. Can consider AVN ablation if needed (CRT-D in place) - On chronic Coumadin, INRs managed by PCP.  - device interrogation today with NSVT/ SVT. Has f/u with Dr. Minerva Fester office tomorrow. Discussed this with provider seeing him tomorrow. Needs f/u with EP.  - BMET, Mg, CBC, INR today (will send to coumadin clinic at [facility])  3. PE, bilateral small sub-segmental - new on CTA 5/23 2/2 sub-therapeutic INR - No DOAC due to interaction with phenobarb. - On Coumadin.    4. Pulmonary Hypertension  - Suspect combination WHO Group 2 and 3 - PFTs in 2018 showed moderate OAD. Also w/ OSA    5. OSA  - No longer wearing CPAP, was told he didn't need it as he sleeps in recliner. - followed by Dr. Mayford Knife, planning home sleep study. Pending insurance approval.    6. HTN - stable today. BP went up when standing. 94/64>100/70  7. COPD -  Not on inhalers. - PFTs 2018 showed moderate obstructive disease. - repeat PFTs 12/23 normal  8. Medication non-compliance - Much improved - Continue paramedicine, appreciate their assistance.  Follow up in 3-4 months with Dr. Glenice Laine, NP  2:03 PM

## 2022-09-03 NOTE — Progress Notes (Unsigned)
Cardiology Office Note:  .   Date:  09/05/2022  ID:  Ruben Murray, DOB 03/28/1945, MRN [id number] PCP: Aliene Beams, MD  [facility] Providers Cardiologist:  Donato Schultz, MD Electrophysiologist:  Will Jorja Loa, MD    Patient Profile: .      PMH Permanent Atrial fibrillation Diagnosed 2018 shortly after having pneumonia, EF 45% at that time Anticoagulated with warfarin (cannot use Eliquis/Xarelto because of interaction with phenobarbital) Started amiodarone in 2020 Multiple cardioversions Chronic combined CHF/NICM Echo 2018 EF 40 to 45% Vision 04/2021 with AF RVR, Echo >< EF 20 to 25%, RV mildly reduced S/p Abbott BiV ICD (AV node not ablated) Chronic shortness of breath with minimal activity Right and left cardiac catheterization 11/2019>> normal coronaries, mild pulmonary hypertension, PA 34/22, mean PA pressure 28 mmHg Obesity Hyperlipidemia Aortic atherosclerosis Hypertension Dilated aortic root Former tobacco abuse Quit smoking 2000 COPD Gold II OSA Stop Bang score 7 Referred to Dr. Mayford Knife for OSA, awaiting sleep test results Pulmonary hypertension Felt combination WHO group 2 & 3 PFTS 2018 showed moderate OAD, also with OSA Stroke Tricuspid valve regurgitation Mitral valve regurgitation  PE, bilateral small sub-segmental New on CTA 07/2021 secondary to sub-therapeutic INR On Coumadin Medication non-compliance  Seen on 06/21/2022 by Tonye Becket, NP in AHF clinic. Echo 06/21/22 EF 20-25%, NYHA Class III. Sherryll Burger is cost-prohibitive.  GDMT includes losartan 12.5 mg daily, torsemide 60 mg daily, digoxin 0.125 mg daily, Jardiance 10 mg daily, Toprol-XL 100 mg daily, and spironolactone 12.5 mg daily.         History of Present Illness: .   Ruben Murray is a very pleasant, loquacious 77 y.o. male with above stated medical history.  Seen in Advanced HF clinic 09/04/22 by Brynda Peon, NP. She reached out to notify us that on device interrogation in the  office, patient having NSVT/SVT. BNP and additional lab work stable. Leigh, device clinic RN, came in to interrogate his device and advised BiV pacing is low at 43%. She reports episodes previously reported as NSVT or SVT appear to be atrial fib/flutter with RVR.   He is accompanied by his friend who helps care for him. Patient reports he is doing well.  He is concerned about worsening sleep apnea. He continues to have shortness of breath/dyspnea on exertion with mild exertion, just walking across his apartment. Now sleeps in a hospital bed rather than in a recliner. Feels that weight is stable. No orthopnea, PND or edema. Continues to limit fluid intake to under 2 L. He denies orthopnea, PND, edema. He denies chest pain or palpitations. Is not aware of a fib.   ROS: + chronic DOE       Studies Reviewed: .         Risk Assessment/Calculations:    CHA2DS2-VASc Score = 6   This indicates a 9.7% annual risk of stroke. The patient's score is based upon: CHF History: 1 HTN History: 1 Diabetes History: 0 Stroke History: 2 Vascular Disease History: 0 Age Score: 2 Gender Score: 0        STOP-Bang Score:  7      Physical Exam:   VS:  BP 114/66   Pulse 66   Ht 5\' 8"  (1.727 m)   Wt 253 lb 3.2 oz (114.9 kg)   SpO2 96%   BMI 38.50 kg/m    Wt Readings from Last 3 Encounters:  09/05/22 253 lb 3.2 oz (114.9 kg)  09/04/22 253 lb (114.8 kg)  08/30/22  246 lb (111.6 kg)    GEN: Well nourished, well developed in no acute distress NECK: No JVD; No carotid bruits CARDIAC: RRR, no murmurs, rubs, gallops RESPIRATORY:  Clear to auscultation without rales, wheezing or rhonchi  ABDOMEN: Soft, non-tender, protuberant EXTREMITIES:  No edema; No deformity     ASSESSMENT AND PLAN: .    Chronic combined CHF/NICM: NYHA III. Chronic DOE. Reduced EF dating back to at least 2018, felt to be tachy mediated with long history of a fib/flutter with poorly controlled rate. S/p CRT implant 02/2022. Echo  06/21/22 with EF 20-25%, RV function moderately reduced. Feels that DOE is stable. No weight gain, edema, orthopnea, or PND. Volume status is stable on exam. No medication changes at HF clinic visit on 09/04/22. Continue GDMT including losartan, torsemide, Jardiance, Toprol-XL, spironolactone. Renal function stable on labs completed 09/04/22.   Permanent atrial fibrillation on chronic anticoagulation: Per device interrogation, 100% AF burden. Having uncontrolled ventricular rates per device interrogation.  He is on metoprolol succinate 100 mg twice daily and amiodarone 200 mg twice daily. BP is somewhat soft. Will forward to Dr. Elberta Fortis for awareness and consideration of AVN ablation if needed as well as advice regarding rate control. Will Continue warfarin for stroke prevention for CHA2DS2-VASc score of 4, followed by our coumadin clinic. Had INR 3.3 on 09/04/22, advised of dose adjustments by RN. No bleeding concerns today. Addendum: was thought to be on digoxin at office visit, however upon further chart review it was stopped in December when amiodarone was started per Dr. Elberta Fortis. I have notified NP Trisha Mangle who saw him yesterday in AHF clinic.   OSA: Previous diagnosis and management by Dr. Earl Gala.  STOP-BANG score is 7.  Was told CPAP was no longer needed. Referred and seen by Dr. Mayford Knife on 08/28/2022 has Itamar home sleep device, awaiting approval from insurance. I have reached out to sleep medicine team for assistance.   History of PE: INR 3.3 on 7/1. Management per coumadin clinic.   Hypertension: BP is stable in clinic.  He reports occasional episodes of lightheadedness. Will forward to Dr. Elberta Fortis for advice on rate control strategy.        Dispo: Keep your September appointment with Dr. Anne Fu  Signed, Eligha Bridegroom, NP-C

## 2022-09-04 ENCOUNTER — Ambulatory Visit
Admission: RE | Admit: 2022-09-04 | Discharge: 2022-09-04 | Disposition: A | Discharge status: Home / Self Care | Payer: No Typology Code available for payment source | Source: Ambulatory Visit | Attending: Internal Medicine | Admitting: Internal Medicine

## 2022-09-04 ENCOUNTER — Other Ambulatory Visit (HOSPITAL_COMMUNITY): Payer: Self-pay

## 2022-09-04 ENCOUNTER — Encounter (HOSPITAL_COMMUNITY): Payer: Self-pay

## 2022-09-04 ENCOUNTER — Ambulatory Visit (INDEPENDENT_AMBULATORY_CARE_PROVIDER_SITE_OTHER): Payer: No Typology Code available for payment source

## 2022-09-04 VITALS — BP 100/70 | HR 86 | Ht 68.0 in | Wt 253.0 lb

## 2022-09-04 DIAGNOSIS — Z7984 Long term (current) use of oral hypoglycemic drugs: Secondary | ICD-10-CM | POA: Diagnosis not present

## 2022-09-04 DIAGNOSIS — G4733 Obstructive sleep apnea (adult) (pediatric): Secondary | ICD-10-CM | POA: Diagnosis not present

## 2022-09-04 DIAGNOSIS — I272 Pulmonary hypertension, unspecified: Secondary | ICD-10-CM | POA: Diagnosis not present

## 2022-09-04 DIAGNOSIS — G40909 Epilepsy, unspecified, not intractable, without status epilepticus: Secondary | ICD-10-CM | POA: Diagnosis not present

## 2022-09-04 DIAGNOSIS — Z7901 Long term (current) use of anticoagulants: Secondary | ICD-10-CM | POA: Insufficient documentation

## 2022-09-04 DIAGNOSIS — J449 Chronic obstructive pulmonary disease, unspecified: Secondary | ICD-10-CM

## 2022-09-04 DIAGNOSIS — I110 Hypertensive heart disease with heart failure: Secondary | ICD-10-CM | POA: Insufficient documentation

## 2022-09-04 DIAGNOSIS — I10 Essential (primary) hypertension: Secondary | ICD-10-CM

## 2022-09-04 DIAGNOSIS — Z87891 Personal history of nicotine dependence: Secondary | ICD-10-CM | POA: Diagnosis not present

## 2022-09-04 DIAGNOSIS — Z8249 Family history of ischemic heart disease and other diseases of the circulatory system: Secondary | ICD-10-CM | POA: Insufficient documentation

## 2022-09-04 DIAGNOSIS — I2699 Other pulmonary embolism without acute cor pulmonale: Secondary | ICD-10-CM | POA: Diagnosis not present

## 2022-09-04 DIAGNOSIS — Z91148 Patient's other noncompliance with medication regimen for other reason: Secondary | ICD-10-CM | POA: Insufficient documentation

## 2022-09-04 DIAGNOSIS — Z79899 Other long term (current) drug therapy: Secondary | ICD-10-CM

## 2022-09-04 DIAGNOSIS — I4821 Permanent atrial fibrillation: Secondary | ICD-10-CM | POA: Diagnosis not present

## 2022-09-04 DIAGNOSIS — Z5982 Transportation insecurity: Secondary | ICD-10-CM | POA: Diagnosis not present

## 2022-09-04 DIAGNOSIS — Z5986 Financial insecurity: Secondary | ICD-10-CM | POA: Diagnosis not present

## 2022-09-04 DIAGNOSIS — I5022 Chronic systolic (congestive) heart failure: Secondary | ICD-10-CM

## 2022-09-04 DIAGNOSIS — I428 Other cardiomyopathies: Secondary | ICD-10-CM | POA: Insufficient documentation

## 2022-09-04 DIAGNOSIS — I4891 Unspecified atrial fibrillation: Secondary | ICD-10-CM | POA: Diagnosis not present

## 2022-09-04 LAB — CBC
HCT: 47.9 % (ref 39.0–52.0)
Hemoglobin: 15.7 g/dL (ref 13.0–17.0)
MCH: 32.6 pg (ref 26.0–34.0)
MCHC: 32.8 g/dL (ref 30.0–36.0)
MCV: 99.4 fL (ref 80.0–100.0)
Platelets: 190 10*3/uL (ref 150–400)
RBC: 4.82 MIL/uL (ref 4.22–5.81)
RDW: 13.7 % (ref 11.5–15.5)
WBC: 6.9 10*3/uL (ref 4.0–10.5)
nRBC: 0 % (ref 0.0–0.2)

## 2022-09-04 LAB — BASIC METABOLIC PANEL
Anion gap: 8 (ref 5–15)
BUN: 24 mg/dL — ABNORMAL HIGH (ref 8–23)
CO2: 32 mmol/L (ref 22–32)
Calcium: 8.7 mg/dL — ABNORMAL LOW (ref 8.9–10.3)
Chloride: 98 mmol/L (ref 98–111)
Creatinine, Ser: 1.25 mg/dL — ABNORMAL HIGH (ref 0.61–1.24)
GFR, Estimated: 59 mL/min — ABNORMAL LOW (ref >60)
Glucose, Bld: 84 mg/dL (ref 70–99)
Potassium: 4.6 mmol/L (ref 3.5–5.1)
Sodium: 138 mmol/L (ref 135–145)

## 2022-09-04 LAB — BRAIN NATRIURETIC PEPTIDE: B Natriuretic Peptide: 250.9 pg/mL — ABNORMAL HIGH (ref 0.0–100.0)

## 2022-09-04 LAB — PROTIME-INR
INR: 3.3 — ABNORMAL HIGH (ref 0.8–1.2)
Prothrombin Time: 33.8 seconds — ABNORMAL HIGH (ref 11.4–15.2)

## 2022-09-04 LAB — DIGOXIN LEVEL: Digoxin Level: <0.2 ng/mL — ABNORMAL LOW (ref 0.8–2.0)

## 2022-09-04 MED ORDER — TORSEMIDE 20 MG PO TABS
40.0000 mg | ORAL_TABLET | Freq: Every day | ORAL | 3 refills | Status: AC
Start: 2022-09-04 — End: ?

## 2022-09-04 NOTE — Patient Instructions (Signed)
Decrease Torsemide to 40 mg daily. Labs drawn today - will call you if abnormal. Return to see Dr. Gala Romney in 3 - 4 months. Please call [phone] in September to schedule this appointment. Please call us at [phone] if any questions or concerns prior to your next visit.

## 2022-09-04 NOTE — Patient Instructions (Signed)
Description   Called and instructed pt to HOLD today's dose and then continue taking warfarin 1 tablet daily except 1/2 tablet on Mondays and Fridays.  Recheck INR in 3 weeks.  Coumadin Clinic [phone] or [phone]

## 2022-09-04 NOTE — Progress Notes (Signed)
Paramedicine Encounter  Patient ID: DAVEON JAFARI, male, DOB: Aug 30, 1945, 77 y.o.,  MRN: [id number]  Met patient in clinic today with Aniceto Boss, NP. Mr. Bland reported continued but not worsened exertional shortness of breath. Pt was found to have a soft blood pressure, but negative for orthostatics and positive compensation from HR. NP advised that Mr. Bonnette device showed many episodes of prolonged SVT and expressed importance of his visit tomorrow with A-fib Clinic to reevaluate his device settings/medications. NP checked on progress of sleep study and requested follow up with Dr. Gala Romney in 3-4 months. Pill box filled for 1x week. Upcoming appointments reviewed. Refills for Buspar and Claratin called into [facility].    Weight @ clinic-253lbs (Weight at home today: 248lbs B/P-94/64 P-88 Device Interrogated - Multiple runs of SVT reported, currently in A-Fib.   Med changes: None  Labs reviewed, no med changes for now.    Maralyn Sago, EMT-Paramedic [phone] 09/04/2022

## 2022-09-05 ENCOUNTER — Telehealth (HOSPITAL_BASED_OUTPATIENT_CLINIC_OR_DEPARTMENT_OTHER): Payer: Self-pay

## 2022-09-05 ENCOUNTER — Ambulatory Visit (INDEPENDENT_AMBULATORY_CARE_PROVIDER_SITE_OTHER): Payer: No Typology Code available for payment source | Admitting: Nurse Practitioner

## 2022-09-05 ENCOUNTER — Encounter: Payer: Self-pay | Admitting: Nurse Practitioner

## 2022-09-05 VITALS — BP 114/66 | HR 66 | Ht 68.0 in | Wt 253.2 lb

## 2022-09-05 DIAGNOSIS — I4891 Unspecified atrial fibrillation: Secondary | ICD-10-CM

## 2022-09-05 DIAGNOSIS — I428 Other cardiomyopathies: Secondary | ICD-10-CM

## 2022-09-05 DIAGNOSIS — I5042 Chronic combined systolic (congestive) and diastolic (congestive) heart failure: Secondary | ICD-10-CM | POA: Diagnosis not present

## 2022-09-05 DIAGNOSIS — I4821 Permanent atrial fibrillation: Secondary | ICD-10-CM | POA: Diagnosis not present

## 2022-09-05 DIAGNOSIS — I5020 Unspecified systolic (congestive) heart failure: Secondary | ICD-10-CM

## 2022-09-05 DIAGNOSIS — Z86711 Personal history of pulmonary embolism: Secondary | ICD-10-CM | POA: Diagnosis not present

## 2022-09-05 DIAGNOSIS — G4733 Obstructive sleep apnea (adult) (pediatric): Secondary | ICD-10-CM | POA: Diagnosis not present

## 2022-09-05 DIAGNOSIS — I10 Essential (primary) hypertension: Secondary | ICD-10-CM | POA: Diagnosis not present

## 2022-09-05 DIAGNOSIS — Z7901 Long term (current) use of anticoagulants: Secondary | ICD-10-CM | POA: Diagnosis not present

## 2022-09-05 NOTE — Patient Instructions (Signed)
WE WILL REACH OUT TO YOU ONCE WE SPEAK WITH DR CAMNITZ.

## 2022-09-05 NOTE — Telephone Encounter (Addendum)
Call placed to Itamar rep. to ask about patient's pacemaker with WatchPat One.  Pacemaker could skew accelerations in pulse if patient is pacemaker dependent.  Discussed with Dr. Mayford Knife and Marcelino Duster if patient can't lay on a bed for in lab study we will proceed with WatchPat One study.  Call to patient who states he is unable to lay flat due to his back problems.  Pt wants to proceed with WatchPat One.  Pt doesn't have a cell phone or internet in his home.  He states Mr Gordy Councilman (who drives for him) has a phone he can use.  It is unclear whether patient has an internet signal at his home.  Message left for Mr. Broadus John to discuss instructions and ability to use WatchPat one.  Awaiting call back.  Jim Like MHA RN CCM

## 2022-09-05 NOTE — Addendum Note (Signed)
Encounter addended by: Alen Bleacher, NP on: 09/05/2022 12:33 PM  Actions taken: Clinical Note Signed

## 2022-09-05 NOTE — Telephone Encounter (Signed)
Per [facility] Prior authorization is not required for CPT 95800. Call to patient who requested his friend [name] be called with instructions and PIN message left for [name] to return call.  Pt requested [name]'s husband get call with instructions both persons listed on DPR.  Patient's friend's husband requested call back.  Jim Like MHA RN CCM

## 2022-09-06 NOTE — Telephone Encounter (Signed)
Spoke with patient's "caregiver" Paulino Rily [phone] (DPR on file).  Ms Broadus John reports pt has no internet and a poor Cell signal in his home.  Discussed probable need for in lab study.  Per Marita Kansas with sleep lab they can provide a "wedge" to have patient in sitting position for his study. Dr. Mayford Knife aware will place order for in lab study. Jim Like MHA RN CCM

## 2022-09-06 NOTE — Addendum Note (Signed)
Addended by: Jim Like D on: 09/06/2022 10:21 AM   Modules accepted: Orders

## 2022-09-12 ENCOUNTER — Telehealth (HOSPITAL_COMMUNITY): Payer: Self-pay

## 2022-09-12 ENCOUNTER — Telehealth: Payer: Self-pay | Admitting: Cardiology

## 2022-09-12 NOTE — Telephone Encounter (Signed)
Pt c/o Shortness Of Breath: STAT if SOB developed within the last 24 hours or pt is noticeably SOB on the phone  1. Are you currently SOB (can you hear that pt is SOB on the phone)? Yes  2. How long have you been experiencing SOB? 2-3 wks  3. Are you SOB when sitting or when up moving around? Doesn't matter what pt is doing, sitting up or moving, he will get sob  4. Are you currently experiencing any other symptoms? Lightheadedness, and weakness.

## 2022-09-12 NOTE — Telephone Encounter (Signed)
Spoke to Ruben Murray about phone call note placed by heart care office that friend of Ruben Murray had called to report shortness of breath and dizziness- Ruben Murray denied this saying he is not currently short of breath other than his usual on exertion and is not light headed at present. He had an episode a few weeks ago of this but was treated for hypotension in the ER- we have since followed up in the home and plan to follow up for a home visit tomorrow morning at 0900. He agreed with plan and I will reach out to HF clinic if a need comes up. Will forward to Dr. Anne Fu nurse group Rocco Serene and [place]) to make them aware. Call complete.    Ruben Murray, EMT-Paramedic [facility]  [phone] 09/12/2022

## 2022-09-12 NOTE — Progress Notes (Signed)
Remote ICD transmission.   

## 2022-09-12 NOTE — Telephone Encounter (Signed)
Spoke with Elnita Maxwell - pt's caregiver.  She reports pt is very SOB and has been for a few weeks now.  He becomes very weak with walking and has had some lightheadedness (none in the past 2-3 days).  Weights are stable.  She does not no what his BP or HR are.  She does not know what medications he is taking.  She reports she will call back tomorrow with more information.

## 2022-09-13 ENCOUNTER — Telehealth: Payer: Self-pay | Admitting: *Deleted

## 2022-09-13 ENCOUNTER — Telehealth (HOSPITAL_COMMUNITY): Payer: Self-pay

## 2022-09-13 ENCOUNTER — Telehealth (HOSPITAL_BASED_OUTPATIENT_CLINIC_OR_DEPARTMENT_OTHER): Payer: Self-pay

## 2022-09-13 ENCOUNTER — Other Ambulatory Visit (HOSPITAL_COMMUNITY): Payer: Self-pay

## 2022-09-13 NOTE — Progress Notes (Signed)
Paramedicine Encounter    Patient ID: Ruben Murray, male    DOB: 23-Feb-1946, 77 y.o.   MRN: [id number]   Complaints - Reports continual arm jerking  Assessment - lung sounds clear, no pedal edema   Compliance with meds - some confusion with having 1.5 weeks worth pill box, will continue with 1x week refills.  Pill box filled for 1x week   Refills needed - Buspar, and loratadine   Meds changes since last visit - none    Social changes - none    BP 108/78   Pulse 88   Resp (!) 22   Wt 248 lb (112.5 kg)   SpO2 94%   BMI 37.71 kg/m  Weight yesterday-248  Last visit weight-253  Saw Ruben Murray in the home today. Ruben Murray denied any dizzy spells, increased shortness of breath, or chest pain. Pt was assessed and noted clear lung sounds and no pedal edema. Pt was assessed for orthostatic changes and found to be negative for same. Pt complained that he has had continual jerking in his arms, especially at night. Pt was advised to follow up with PCP for further guidance. Pt's pill box was filled for 1x week and educated on when and how to use it. We reviewed upcoming appointments and updated pt's calendars for easy reference. Pt had no other concerns. Home visit complete. Will follow up in 1x week.    Maralyn Sago, EMT-Paramedic [phone]  ACTION: Home visit completed    Patient Care Team: Aliene Beams, MD as PCP - General (Family Medicine) Jake Bathe, MD as PCP - Cardiology (Cardiology) Regan Lemming, MD as PCP - Electrophysiology (Cardiology) Aliene Beams, MD (Family Medicine) [facility] as Case Manager (Hospice and Palliative Medicine)  Patient Active Problem List   Diagnosis Date Noted   CHF (congestive heart failure) (HCC) 02/03/2022   Atrial fibrillation (HCC) 11/02/2021   Long term (current) use of anticoagulants 11/02/2021   AKI (acute kidney injury) (HCC) 07/27/2021   Acute pulmonary embolism (HCC) 07/26/2021   Acute on chronic  systolic CHF (congestive heart failure) (HCC) 05/17/2021   Hypercoagulable state (HCC) 05/10/2021   Left leg pain 04/24/2021   OSA on CPAP 04/24/2021   Mitral regurgitation 04/24/2021   Tricuspid regurgitation 04/24/2021   Dilated aortic root (HCC) 04/24/2021   Permanent atrial fibrillation (HCC) 04/19/2021   Chronic arthropathy 04/13/2020   Callus 04/13/2020   Onychomycosis 12/12/2019   Left leg cellulitis 12/12/2019   Chronic systolic heart failure (HCC) 12/09/2019   Dilated cardiomyopathy (HCC)    Atrial flutter (HCC) 11/03/2019   Atrial flutter with rapid ventricular response (HCC) 11/03/2019   Acquired thrombophilia (HCC) 01/15/2019   Atrial fibrillation with rapid ventricular response (HCC) 10/29/2017   Chronic combined systolic and diastolic heart failure (HCC) 10/29/2017   Prolonged QT interval 10/29/2017   Sensorineural hearing loss (SNHL) of both ears 01/31/2017   Dysphonia 10/03/2016   Laryngopharyngeal reflux (LPR) 10/03/2016   Nasal polyps 10/03/2016   Rhinitis medicamentosa 10/03/2016   Chronic laryngitis 10/03/2016   Hoarseness 08/31/2016   Moderate persistent asthma 08/02/2016   COPD (chronic obstructive pulmonary disease) (HCC) 08/02/2016   Morbid (severe) obesity due to excess calories (HCC) 08/02/2016   Community acquired pneumonia 03/06/2016   Essential hypertension 03/06/2016   Mixed hyperlipidemia 03/06/2016   Right thyroid nodule 03/06/2016   Seizures (HCC) 03/06/2016   Fever 03/12/2015   Recurrent erosion of cornea 06/23/2011    Current Outpatient Medications:    acetaminophen (  TYLENOL) 500 MG tablet, Take 1,000 mg by mouth at bedtime., Disp: , Rfl:    busPIRone (BUSPAR) 5 MG tablet, Take 5 mg by mouth 2 (two) times daily., Disp: , Rfl:    Carboxymethylcellulose Sod PF 0.5 % SOLN, Place 1 drop into both eyes daily as needed (dry eyes)., Disp: , Rfl:    Cholecalciferol (VITAMIN D3) 25 MCG (1000 UT) CAPS, Take 1,000 Units by mouth daily., Disp: , Rfl:     escitalopram (LEXAPRO) 20 MG tablet, Take 20 mg by mouth at bedtime., Disp: , Rfl:    finasteride (PROSCAR) 5 MG tablet, TAKE 1 TABLET (5 MG TOTAL) BY MOUTH DAILY AT 12 NOON., Disp: 90 tablet, Rfl: 1   fluticasone (FLONASE) 50 MCG/ACT nasal spray, Place 1 spray into both nostrils 2 (two) times daily., Disp: , Rfl:    JARDIANCE 10 MG TABS tablet, TAKE 1 TABLET BY MOUTH DAILY BEFORE BREAKFAST., Disp: 90 tablet, Rfl: 3   loratadine (CLARITIN) 10 MG tablet, Take 10 mg by mouth every morning., Disp: , Rfl:    losartan (COZAAR) 25 MG tablet, TAKE 1/2 TABLET BY MOUTH EVERY DAY, Disp: 45 tablet, Rfl: 3   metoprolol succinate (TOPROL-XL) 100 MG 24 hr tablet, Take 1 tablet (100 mg total) by mouth 2 (two) times daily. Take with or immediately following a meal., Disp: 180 tablet, Rfl: 3   Multiple Vitamin (MULTIVITAMIN WITH MINERALS) TABS tablet, Take 1 tablet by mouth every morning., Disp: , Rfl:    PHENobarbital (LUMINAL) 97.2 MG tablet, Take 97.2 mg by mouth every evening. Takes between 3:00-5:00pm., Disp: , Rfl:    potassium chloride SA (KLOR-CON M) 20 MEQ tablet, Take 1 tablet (20 mEq total) by mouth daily., Disp: 90 tablet, Rfl: 3   pravastatin (PRAVACHOL) 40 MG tablet, Take 40 mg by mouth at bedtime. , Disp: , Rfl:    spironolactone (ALDACTONE) 25 MG tablet, Take 0.5 tablets (12.5 mg total) by mouth daily., Disp: 45 tablet, Rfl: 2   torsemide (DEMADEX) 20 MG tablet, Take 2 tablets (40 mg total) by mouth daily., Disp: 180 tablet, Rfl: 3   triamcinolone cream (KENALOG) 0.1 %, Apply 1 application  topically 2 (two) times daily as needed (rash)., Disp: , Rfl:    warfarin (COUMADIN) 5 MG tablet, Take 5 mg by mouth at bedtime., Disp: , Rfl:    amiodarone (PACERONE) 200 MG tablet, Take 200 mg by mouth 2 (two) times daily. (Patient not taking: Reported on 09/13/2022), Disp: , Rfl:  Allergies  Allergen Reactions   Dilaudid [Hydromorphone Hcl] Other (See Comments)    Makes pt hyper    Hydromorphone Other  (See Comments)    hyperactiviity Other reaction(s): made him wild Other reaction(s): Unknown   Morphine Sulfate     Other reaction(s): at high dose causes confusion Other reaction(s): at high dose causes confusion Other reaction(s): at high dose causes confusion, Other (See Comments) Other reaction(s): at high dose causes confusion Other reaction(s): at high dose causes confusion    Tegretol [Carbamazepine] Hives   Carbamazepine Hives, Rash and Other (See Comments)    Other reaction(s): hives Other reaction(s): Hives Other reaction(s): hives     Social History   Socioeconomic History   Marital status: Married    Spouse name: Not on file   Number of children: Not on file   Years of education: Not on file   Highest education level: Not on file  Occupational History   Not on file  Tobacco Use   Smoking status:  Former    Packs/day: 3.00    Years: 48.00    Additional pack years: 0.00    Total pack years: 144.00    Types: Cigarettes    Quit date: 2000    Years since quitting: 24.5   Smokeless tobacco: Never  Vaping Use   Vaping Use: Never used  Substance and Sexual Activity   Alcohol use: Never   Drug use: Never   Sexual activity: Never  Other Topics Concern   Not on file  Social History Narrative   ** Merged History Encounter **       Social Determinants of Health   Financial Resource Strain: Medium Risk (10/26/2021)   Overall Financial Resource Strain (CARDIA)    Difficulty of Paying Living Expenses: Somewhat hard  Food Insecurity: No Food Insecurity (02/04/2022)   Hunger Vital Sign    Worried About Running Out of Food in the Last Year: Never true    Ran Out of Food in the Last Year: Never true  Transportation Needs: Unmet Transportation Needs (02/04/2022)   PRAPARE - Administrator, Civil Service (Medical): Yes    Lack of Transportation (Non-Medical): No  Physical Activity: Inactive (12/15/2019)   Exercise Vital Sign    Days of Exercise per Week:  0 days    Minutes of Exercise per Session: 0 min  Stress: Stress Concern Present (09/26/2018)   [employer] of Occupational Health - Occupational Stress Questionnaire    Feeling of Stress : To some extent  Social Connections: Not on file  Intimate Partner Violence: Not At Risk (02/04/2022)   Humiliation, Afraid, Rape, and Kick questionnaire    Fear of Current or Ex-Partner: No    Emotionally Abused: No    Physically Abused: No    Sexually Abused: No    Physical Exam      Future Appointments  Date Time Provider Department Center  09/26/2022  1:30 PM [facility] None  10/23/2022  8:40 AM Graciella Freer, PA-C [facility]  11/24/2022  7:00 AM [facility] DEVICE REMOTES [facility]  11/24/2022  1:15 PM Regal, Kirstie Peri, DPM [facility]  11/24/2022  3:00 PM Jake Bathe, MD [facility]  02/23/2023  7:00 AM [facility] DEVICE REMOTES [facility]  05/25/2023  7:00 AM [facility] DEVICE REMOTES [facility]  08/24/2023  7:00 AM [facility] DEVICE REMOTES [facility]

## 2022-09-13 NOTE — Telephone Encounter (Signed)
Prior Authorization for SPLIT-N sent to [employer] via web portal. Tracking Number . 7/10 READY- APPROVED-Request #[id number] 09/06/2022 Awaiting access for Availity. TDS

## 2022-09-13 NOTE — Telephone Encounter (Signed)
Spoke to [name] about phone call note placed by heart care office that friend of Ruben Murray had called to report shortness of breath and dizziness- Ruben Murray denied this saying he is not currently short of breath other than his usual on exertion and is not light headed at present. He had an episode a few weeks ago of this but was treated for hypotension in the ER- we have since followed up in the home and plan to follow up for a home visit tomorrow morning at 0900. He agreed with plan and I will reach out to HF clinic if a need comes up. Will forward to Dr. Skains nurse group (Muriah Harsha and Cassie Hall) to make them aware. Call complete.    Heather Spencer, EMT-Paramedic [facility]  [phone] 09/12/2022  

## 2022-09-13 NOTE — Telephone Encounter (Signed)
Amiodarone:  On Feb 12 of 2024 Camnitz stopped Amiodarone. Pt advised that he has not been taking since then and does not have the bottles or refills. Last week was seen by heart care and following that visit, amiodarone was added to med list, but not mentioned to be added in AVS.    Can we confirm he is to continue not taking Amiodarone?  Maralyn Sago, EMT-Paramedic [phone] 09/13/2022

## 2022-09-13 NOTE — Telephone Encounter (Signed)
Spoke with patients "caregiver" [name] to provide lab results and let her know INR was elevated and Coumadin clinic will be contacting her for any coumadin dose changes. [name] had questions about call to triage yesterday, questions asked and answered, [name] is going to follow up with paramedicine. Chia Rock MHA RN CCM

## 2022-09-13 NOTE — Telephone Encounter (Signed)
-----   Message from Alen Bleacher, NP sent at 09/04/2022  4:42 PM EDT ----- Labs stable.   INR high. Forwarding to [facility]in clinic.

## 2022-09-13 NOTE — Progress Notes (Signed)
Spoke with patients "caregiver" [name] to provide lab results and let her know INR was elevated and Coumadin clinic will be contacting her for any coumadin dose changes. [name] had questions about call to triage yesterday, questions asked and answered, [name] is going to follow up with paramedicine. Jim Like MHA RN CCM

## 2022-09-14 NOTE — Telephone Encounter (Signed)
Dr Elberta Fortis discontinued amiodarone in February 2024.   Thanks  Gaige Fussner NP-C  10:21 AM

## 2022-09-18 DIAGNOSIS — I5042 Chronic combined systolic (congestive) and diastolic (congestive) heart failure: Secondary | ICD-10-CM | POA: Diagnosis not present

## 2022-09-20 ENCOUNTER — Other Ambulatory Visit (HOSPITAL_COMMUNITY): Payer: Self-pay

## 2022-09-20 DIAGNOSIS — I5042 Chronic combined systolic (congestive) and diastolic (congestive) heart failure: Secondary | ICD-10-CM | POA: Diagnosis not present

## 2022-09-20 DIAGNOSIS — J449 Chronic obstructive pulmonary disease, unspecified: Secondary | ICD-10-CM | POA: Diagnosis not present

## 2022-09-20 DIAGNOSIS — I110 Hypertensive heart disease with heart failure: Secondary | ICD-10-CM | POA: Diagnosis not present

## 2022-09-20 DIAGNOSIS — I4891 Unspecified atrial fibrillation: Secondary | ICD-10-CM | POA: Diagnosis not present

## 2022-09-20 NOTE — Progress Notes (Signed)
Paramedicine Encounter    Patient ID: Ruben Murray, male    DOB: 12-Oct-1945, 77 y.o.   MRN: [id number]   Complaints- no complaints   Assessment- CAOX4, warm and dry, seated with no shortness of breath, dizziness, no swelling, lungs clear.   Compliance with meds- missed yesterdays doses of medications   Pill box filled- for one week   Refills needed- none   Meds changes since last visit- none     Social changes- none   Arrived for home visit for Ruben Murray who reports to be feeling good with no complaints other than his normal shortness of breath on exertion. Vitals and assessment obtained. Lungs clear. NO lower leg swelling. No reports of chest pain or dizziness. I reviewed meds and filled pill box for one week. He is unable to fill it himself due to confusion on what medications he takes and what they are for. We will continue to see him weekly. He has not been adhering to a heart healthy diet- eating a lot of prepackaged foods including vienna sausages, soda, chips- I explained the importance of eating healthier options and to try to eat low sodium. He agreed on same and says he will do better. We reviewed upcoming appointments. Home visit complete. I will see Gurveer in one week.     BP 130/76   Pulse 88   Resp 16   Wt 249 lb (112.9 kg)   SpO2 94%   BMI 37.86 kg/m  Weight yesterday-- 247lbs Last visit weight-- 248lbs    Maralyn Sago, EMT-Paramedic [phone]  ACTION: Home visit completed    Patient Care Team: Aliene Beams, MD as PCP - General (Family Medicine) Jake Bathe, MD as PCP - Cardiology (Cardiology) Regan Lemming, MD as PCP - Electrophysiology (Cardiology) Aliene Beams, MD (Family Medicine) [facility] as Case Manager (Hospice and Palliative Medicine)  Patient Active Problem List   Diagnosis Date Noted   CHF (congestive heart failure) (HCC) 02/03/2022   Atrial fibrillation (HCC) 11/02/2021   Long term (current) use of  anticoagulants 11/02/2021   AKI (acute kidney injury) (HCC) 07/27/2021   Acute pulmonary embolism (HCC) 07/26/2021   Acute on chronic systolic CHF (congestive heart failure) (HCC) 05/17/2021   Hypercoagulable state (HCC) 05/10/2021   Left leg pain 04/24/2021   OSA on CPAP 04/24/2021   Mitral regurgitation 04/24/2021   Tricuspid regurgitation 04/24/2021   Dilated aortic root (HCC) 04/24/2021   Permanent atrial fibrillation (HCC) 04/19/2021   Chronic arthropathy 04/13/2020   Callus 04/13/2020   Onychomycosis 12/12/2019   Left leg cellulitis 12/12/2019   Chronic systolic heart failure (HCC) 12/09/2019   Dilated cardiomyopathy (HCC)    Atrial flutter (HCC) 11/03/2019   Atrial flutter with rapid ventricular response (HCC) 11/03/2019   Acquired thrombophilia (HCC) 01/15/2019   Atrial fibrillation with rapid ventricular response (HCC) 10/29/2017   Chronic combined systolic and diastolic heart failure (HCC) 10/29/2017   Prolonged QT interval 10/29/2017   Sensorineural hearing loss (SNHL) of both ears 01/31/2017   Dysphonia 10/03/2016   Laryngopharyngeal reflux (LPR) 10/03/2016   Nasal polyps 10/03/2016   Rhinitis medicamentosa 10/03/2016   Chronic laryngitis 10/03/2016   Hoarseness 08/31/2016   Moderate persistent asthma 08/02/2016   COPD (chronic obstructive pulmonary disease) (HCC) 08/02/2016   Morbid (severe) obesity due to excess calories (HCC) 08/02/2016   Community acquired pneumonia 03/06/2016   Essential hypertension 03/06/2016   Mixed hyperlipidemia 03/06/2016   Right thyroid nodule 03/06/2016   Seizures (HCC) 03/06/2016  Fever 03/12/2015   Recurrent erosion of cornea 06/23/2011    Current Outpatient Medications:    acetaminophen (TYLENOL) 500 MG tablet, Take 1,000 mg by mouth at bedtime., Disp: , Rfl:    busPIRone (BUSPAR) 5 MG tablet, Take 5 mg by mouth 2 (two) times daily., Disp: , Rfl:    Carboxymethylcellulose Sod PF 0.5 % SOLN, Place 1 drop into both eyes daily as  needed (dry eyes)., Disp: , Rfl:    Cholecalciferol (VITAMIN D3) 25 MCG (1000 UT) CAPS, Take 1,000 Units by mouth daily., Disp: , Rfl:    escitalopram (LEXAPRO) 20 MG tablet, Take 20 mg by mouth at bedtime., Disp: , Rfl:    finasteride (PROSCAR) 5 MG tablet, TAKE 1 TABLET (5 MG TOTAL) BY MOUTH DAILY AT 12 NOON., Disp: 90 tablet, Rfl: 1   fluticasone (FLONASE) 50 MCG/ACT nasal spray, Place 1 spray into both nostrils 2 (two) times daily., Disp: , Rfl:    JARDIANCE 10 MG TABS tablet, TAKE 1 TABLET BY MOUTH DAILY BEFORE BREAKFAST., Disp: 90 tablet, Rfl: 3   loratadine (CLARITIN) 10 MG tablet, Take 10 mg by mouth every morning., Disp: , Rfl:    losartan (COZAAR) 25 MG tablet, TAKE 1/2 TABLET BY MOUTH EVERY DAY, Disp: 45 tablet, Rfl: 3   metoprolol succinate (TOPROL-XL) 100 MG 24 hr tablet, Take 1 tablet (100 mg total) by mouth 2 (two) times daily. Take with or immediately following a meal., Disp: 180 tablet, Rfl: 3   Multiple Vitamin (MULTIVITAMIN WITH MINERALS) TABS tablet, Take 1 tablet by mouth every morning., Disp: , Rfl:    PHENobarbital (LUMINAL) 97.2 MG tablet, Take 97.2 mg by mouth every evening. Takes between 3:00-5:00pm., Disp: , Rfl:    potassium chloride SA (KLOR-CON M) 20 MEQ tablet, Take 1 tablet (20 mEq total) by mouth daily., Disp: 90 tablet, Rfl: 3   pravastatin (PRAVACHOL) 40 MG tablet, Take 40 mg by mouth at bedtime. , Disp: , Rfl:    spironolactone (ALDACTONE) 25 MG tablet, Take 0.5 tablets (12.5 mg total) by mouth daily., Disp: 45 tablet, Rfl: 2   torsemide (DEMADEX) 20 MG tablet, Take 2 tablets (40 mg total) by mouth daily., Disp: 180 tablet, Rfl: 3   triamcinolone cream (KENALOG) 0.1 %, Apply 1 application  topically 2 (two) times daily as needed (rash)., Disp: , Rfl:    warfarin (COUMADIN) 5 MG tablet, Take 5 mg by mouth at bedtime., Disp: , Rfl:    amiodarone (PACERONE) 200 MG tablet, Take 200 mg by mouth 2 (two) times daily. (Patient not taking: Reported on 09/13/2022), Disp: ,  Rfl:  Allergies  Allergen Reactions   Dilaudid [Hydromorphone Hcl] Other (See Comments)    Makes pt hyper    Hydromorphone Other (See Comments)    hyperactiviity Other reaction(s): made him wild Other reaction(s): Unknown   Morphine Sulfate     Other reaction(s): at high dose causes confusion Other reaction(s): at high dose causes confusion Other reaction(s): at high dose causes confusion, Other (See Comments) Other reaction(s): at high dose causes confusion Other reaction(s): at high dose causes confusion    Tegretol [Carbamazepine] Hives   Carbamazepine Hives, Rash and Other (See Comments)    Other reaction(s): hives Other reaction(s): Hives Other reaction(s): hives     Social History   Socioeconomic History   Marital status: Married    Spouse name: Not on file   Number of children: Not on file   Years of education: Not on file   Highest education  level: Not on file  Occupational History   Not on file  Tobacco Use   Smoking status: Former    Current packs/day: 0.00    Average packs/day: 3.0 packs/day for 48.0 years (144.0 ttl pk-yrs)    Types: Cigarettes    Start date: 13    Quit date: 2000    Years since quitting: 24.5   Smokeless tobacco: Never  Vaping Use   Vaping status: Never Used  Substance and Sexual Activity   Alcohol use: Never   Drug use: Never   Sexual activity: Never  Other Topics Concern   Not on file  Social History Narrative   ** Merged History Encounter **       Social Determinants of Health   Financial Resource Strain: Medium Risk (10/26/2021)   Overall Financial Resource Strain (CARDIA)    Difficulty of Paying Living Expenses: Somewhat hard  Food Insecurity: Low Risk  (07/21/2022)   [facility]   Food vital sign    Within the past 12 months, you worried that your food would run out before you got money to buy more: Never true    Within the past 12 months, the food you bought just didn't last and you  didn't have money to get more. : Never true  Transportation Needs: No Transportation Needs (07/21/2022)   [facility]   Transportation    In the past 12 months, has lack of reliable transportation kept you from medical appointments, meetings, work or from getting things needed for daily living? : No  Physical Activity: Inactive (12/15/2019)   Exercise Vital Sign    Days of Exercise per Week: 0 days    Minutes of Exercise per Session: 0 min  Stress: Stress Concern Present (09/26/2018)   [employer] of Occupational Health - Occupational Stress Questionnaire    Feeling of Stress : To some extent  Social Connections: Not on file  Intimate Partner Violence: Not At Risk (02/04/2022)   Humiliation, Afraid, Rape, and Kick questionnaire    Fear of Current or Ex-Partner: No    Emotionally Abused: No    Physically Abused: No    Sexually Abused: No    Physical Exam      Future Appointments  Date Time Provider Department Center  09/26/2022  1:30 PM [facility] None  10/12/2022  8:00 PM Quintella Reichert, MD [facility]  10/23/2022  8:40 AM Graciella Freer, PA-C [facility]  11/24/2022  7:00 AM [facility] DEVICE REMOTES [facility]  11/24/2022  1:15 PM Regal, Kirstie Peri, DPM [facility]  11/24/2022  3:00 PM Jake Bathe, MD [facility]  02/23/2023  7:00 AM [facility] DEVICE REMOTES [facility]  05/25/2023  7:00 AM [facility] DEVICE REMOTES [facility]  08/24/2023  7:00 AM [facility] DEVICE REMOTES [facility]

## 2022-09-26 ENCOUNTER — Ambulatory Visit: Payer: No Typology Code available for payment source | Attending: Cardiovascular Disease | Admitting: *Deleted

## 2022-09-26 DIAGNOSIS — Z7901 Long term (current) use of anticoagulants: Secondary | ICD-10-CM

## 2022-09-26 DIAGNOSIS — I4891 Unspecified atrial fibrillation: Secondary | ICD-10-CM

## 2022-09-26 DIAGNOSIS — I2699 Other pulmonary embolism without acute cor pulmonale: Secondary | ICD-10-CM | POA: Diagnosis not present

## 2022-09-26 LAB — POCT INR: INR: 2.2 (ref 2.0–3.0)

## 2022-09-26 NOTE — Patient Instructions (Signed)
Description   Continue taking warfarin 1 tablet daily except 1/2 tablet on Mondays and Fridays. Recheck INR in 4 weeks. [facility] [phone] or [phone]

## 2022-09-27 ENCOUNTER — Other Ambulatory Visit (HOSPITAL_COMMUNITY): Payer: Self-pay

## 2022-09-27 NOTE — Progress Notes (Signed)
Paramedicine Encounter    Patient ID: Ruben Murray, male    DOB: [birth year], 77 y.o.   MRN: [id number]   Complaints- no complaints   Assessment- CAOx4, warm and dry, slight lower leg edema noted bilaterally, no increased shortness of breath, no chest pain, no dizziness. Lungs clear.   Compliance with meds- Missed sun and Monday spironolactone doses   Pill box filled- for one week   Refills needed- phenobarbital   Meds changes since last visit- none     Social changes- none    VISIT SUMMARY-  Met with Ruben Murray in the home today where he reports to be feeling good with no complaints. He states he has been having no more shortness of breath than normal, no chest pain, no dizziness. He did admit to eating some BBQ over the weekend and some extra salty snacks. We educated him on the need for him to maintain a low sodium diet and to be sure he drinks around 64oz of fluid daily continuing to weigh daily.  We discussed the importance of complying with his medication. He agreed with plan. Vitals and assessment obtained. We discussed upcoming appointments. Home visit complete. I will see Ruben Murray in one week.   BP 102/72   Pulse 82   Resp 16   Wt 247 lb (112 kg)   SpO2 95%   BMI 37.56 kg/m  Weight yesterday-- 249lbs  Last visit weight-- 249lbs      ACTION: Home visit completed     Patient Care Team: Aliene Beams, MD as PCP - General (Family Medicine) Jake Bathe, MD as PCP - Cardiology (Cardiology) Regan Lemming, MD as PCP - Electrophysiology (Cardiology) Aliene Beams, MD (Family Medicine) [facility] as Case Manager (Hospice and Palliative Medicine)  Patient Active Problem List   Diagnosis Date Noted   CHF (congestive heart failure) (HCC) 02/03/2022   Atrial fibrillation (HCC) 11/02/2021   Long term (current) use of anticoagulants 11/02/2021   AKI (acute kidney injury) (HCC) 07/27/2021   Acute pulmonary embolism (HCC) 07/26/2021   Acute  on chronic systolic CHF (congestive heart failure) (HCC) 05/17/2021   Hypercoagulable state (HCC) 05/10/2021   Left leg pain 04/24/2021   OSA on CPAP 04/24/2021   Mitral regurgitation 04/24/2021   Tricuspid regurgitation 04/24/2021   Dilated aortic root (HCC) 04/24/2021   Permanent atrial fibrillation (HCC) 04/19/2021   Chronic arthropathy 04/13/2020   Callus 04/13/2020   Onychomycosis 12/12/2019   Left leg cellulitis 12/12/2019   Chronic systolic heart failure (HCC) 12/09/2019   Dilated cardiomyopathy (HCC)    Atrial flutter (HCC) 11/03/2019   Atrial flutter with rapid ventricular response (HCC) 11/03/2019   Acquired thrombophilia (HCC) 01/15/2019   Atrial fibrillation with rapid ventricular response (HCC) 10/29/2017   Chronic combined systolic and diastolic heart failure (HCC) 10/29/2017   Prolonged QT interval 10/29/2017   Sensorineural hearing loss (SNHL) of both ears 01/31/2017   Dysphonia 10/03/2016   Laryngopharyngeal reflux (LPR) 10/03/2016   Nasal polyps 10/03/2016   Rhinitis medicamentosa 10/03/2016   Chronic laryngitis 10/03/2016   Hoarseness 08/31/2016   Moderate persistent asthma 08/02/2016   COPD (chronic obstructive pulmonary disease) (HCC) 08/02/2016   Morbid (severe) obesity due to excess calories (HCC) 08/02/2016   Community acquired pneumonia 03/06/2016   Essential hypertension 03/06/2016   Mixed hyperlipidemia 03/06/2016   Right thyroid nodule 03/06/2016   Seizures (HCC) 03/06/2016   Fever 03/12/2015   Recurrent erosion of cornea 06/23/2011    Current Outpatient Medications:  acetaminophen (TYLENOL) 500 MG tablet, Take 1,000 mg by mouth at bedtime., Disp: , Rfl:    busPIRone (BUSPAR) 5 MG tablet, Take 5 mg by mouth 2 (two) times daily., Disp: , Rfl:    Carboxymethylcellulose Sod PF 0.5 % SOLN, Place 1 drop into both eyes daily as needed (dry eyes)., Disp: , Rfl:    Cholecalciferol (VITAMIN D3) 25 MCG (1000 UT) CAPS, Take 1,000 Units by mouth daily.,  Disp: , Rfl:    escitalopram (LEXAPRO) 20 MG tablet, Take 20 mg by mouth at bedtime., Disp: , Rfl:    finasteride (PROSCAR) 5 MG tablet, TAKE 1 TABLET (5 MG TOTAL) BY MOUTH DAILY AT 12 NOON., Disp: 90 tablet, Rfl: 1   fluticasone (FLONASE) 50 MCG/ACT nasal spray, Place 1 spray into both nostrils 2 (two) times daily., Disp: , Rfl:    JARDIANCE 10 MG TABS tablet, TAKE 1 TABLET BY MOUTH DAILY BEFORE BREAKFAST., Disp: 90 tablet, Rfl: 3   loratadine (CLARITIN) 10 MG tablet, Take 10 mg by mouth every morning., Disp: , Rfl:    losartan (COZAAR) 25 MG tablet, TAKE 1/2 TABLET BY MOUTH EVERY DAY, Disp: 45 tablet, Rfl: 3   metoprolol succinate (TOPROL-XL) 100 MG 24 hr tablet, Take 1 tablet (100 mg total) by mouth 2 (two) times daily. Take with or immediately following a meal., Disp: 180 tablet, Rfl: 3   Multiple Vitamin (MULTIVITAMIN WITH MINERALS) TABS tablet, Take 1 tablet by mouth every morning., Disp: , Rfl:    PHENobarbital (LUMINAL) 97.2 MG tablet, Take 97.2 mg by mouth every evening. Takes between 3:00-5:00pm., Disp: , Rfl:    potassium chloride SA (KLOR-CON M) 20 MEQ tablet, Take 1 tablet (20 mEq total) by mouth daily., Disp: 90 tablet, Rfl: 3   pravastatin (PRAVACHOL) 40 MG tablet, Take 40 mg by mouth at bedtime. , Disp: , Rfl:    spironolactone (ALDACTONE) 25 MG tablet, Take 0.5 tablets (12.5 mg total) by mouth daily., Disp: 45 tablet, Rfl: 2   torsemide (DEMADEX) 20 MG tablet, Take 2 tablets (40 mg total) by mouth daily., Disp: 180 tablet, Rfl: 3   triamcinolone cream (KENALOG) 0.1 %, Apply 1 application  topically 2 (two) times daily as needed (rash)., Disp: , Rfl:    warfarin (COUMADIN) 5 MG tablet, Take 5 mg by mouth at bedtime., Disp: , Rfl:    amiodarone (PACERONE) 200 MG tablet, Take 200 mg by mouth 2 (two) times daily. (Patient not taking: Reported on 09/13/2022), Disp: , Rfl:  Allergies  Allergen Reactions   Dilaudid [Hydromorphone Hcl] Other (See Comments)    Makes pt hyper     Hydromorphone Other (See Comments)    hyperactiviity Other reaction(s): made him wild Other reaction(s): Unknown   Morphine Sulfate     Other reaction(s): at high dose causes confusion Other reaction(s): at high dose causes confusion Other reaction(s): at high dose causes confusion, Other (See Comments) Other reaction(s): at high dose causes confusion Other reaction(s): at high dose causes confusion    Tegretol [Carbamazepine] Hives   Carbamazepine Hives, Rash and Other (See Comments)    Other reaction(s): hives Other reaction(s): Hives Other reaction(s): hives     Social History   Socioeconomic History   Marital status: Married    Spouse name: Not on file   Number of children: Not on file   Years of education: Not on file   Highest education level: Not on file  Occupational History   Not on file  Tobacco Use   Smoking  status: Former    Current packs/day: 0.00    Average packs/day: 3.0 packs/day for 48.0 years (144.0 ttl pk-yrs)    Types: Cigarettes    Start date: 10    Quit date: 2000    Years since quitting: 24.5   Smokeless tobacco: Never  Vaping Use   Vaping status: Never Used  Substance and Sexual Activity   Alcohol use: Never   Drug use: Never   Sexual activity: Never  Other Topics Concern   Not on file  Social History Narrative   ** Merged History Encounter **       Social Determinants of Health   Financial Resource Strain: Medium Risk (10/26/2021)   Overall Financial Resource Strain (CARDIA)    Difficulty of Paying Living Expenses: Somewhat hard  Food Insecurity: Low Risk  (07/21/2022)   [facility]   Food vital sign    Within the past 12 months, you worried that your food would run out before you got money to buy more: Never true    Within the past 12 months, the food you bought just didn't last and you didn't have money to get more. : Never true  Transportation Needs: No Transportation Needs (07/21/2022)   [facility]   Transportation    In the past 12 months, has lack of reliable transportation kept you from medical appointments, meetings, work or from getting things needed for daily living? : No  Physical Activity: Inactive (12/15/2019)   Exercise Vital Sign    Days of Exercise per Week: 0 days    Minutes of Exercise per Session: 0 min  Stress: Stress Concern Present (09/26/2018)   [employer] of Occupational Health - Occupational Stress Questionnaire    Feeling of Stress : To some extent  Social Connections: Not on file  Intimate Partner Violence: Not At Risk (02/04/2022)   Humiliation, Afraid, Rape, and Kick questionnaire    Fear of Current or Ex-Partner: No    Emotionally Abused: No    Physically Abused: No    Sexually Abused: No    Physical Exam      Future Appointments  Date Time Provider Department Center  10/12/2022  8:00 PM Quintella Reichert, MD [facility]  10/23/2022  8:40 AM Graciella Freer, PA-C [facility]  10/23/2022  9:45 AM [facility] None  11/24/2022  7:00 AM [facility] DEVICE REMOTES [facility]  11/24/2022  1:15 PM Lenn Sink, DPM [facility]  11/24/2022  3:00 PM Jake Bathe, MD [facility]  02/23/2023  7:00 AM [facility] DEVICE REMOTES [facility]  05/25/2023  7:00 AM [facility] DEVICE REMOTES [facility]  08/24/2023  7:00 AM [facility] DEVICE REMOTES [facility]

## 2022-10-01 ENCOUNTER — Other Ambulatory Visit (HOSPITAL_COMMUNITY): Payer: Self-pay | Admitting: Internal Medicine

## 2022-10-01 DIAGNOSIS — I5022 Chronic systolic (congestive) heart failure: Secondary | ICD-10-CM

## 2022-10-02 DIAGNOSIS — F331 Major depressive disorder, recurrent, moderate: Secondary | ICD-10-CM | POA: Diagnosis not present

## 2022-10-02 DIAGNOSIS — I10 Essential (primary) hypertension: Secondary | ICD-10-CM | POA: Diagnosis not present

## 2022-10-02 DIAGNOSIS — D7589 Other specified diseases of blood and blood-forming organs: Secondary | ICD-10-CM | POA: Diagnosis not present

## 2022-10-02 DIAGNOSIS — I429 Cardiomyopathy, unspecified: Secondary | ICD-10-CM | POA: Diagnosis not present

## 2022-10-02 DIAGNOSIS — I480 Paroxysmal atrial fibrillation: Secondary | ICD-10-CM | POA: Diagnosis not present

## 2022-10-02 DIAGNOSIS — G40909 Epilepsy, unspecified, not intractable, without status epilepticus: Secondary | ICD-10-CM | POA: Diagnosis not present

## 2022-10-02 DIAGNOSIS — Z9989 Dependence on other enabling machines and devices: Secondary | ICD-10-CM | POA: Diagnosis not present

## 2022-10-02 DIAGNOSIS — F419 Anxiety disorder, unspecified: Secondary | ICD-10-CM | POA: Diagnosis not present

## 2022-10-02 DIAGNOSIS — E7800 Pure hypercholesterolemia, unspecified: Secondary | ICD-10-CM | POA: Diagnosis not present

## 2022-10-03 ENCOUNTER — Telehealth (HOSPITAL_COMMUNITY): Payer: Self-pay | Admitting: Emergency Medicine

## 2022-10-03 ENCOUNTER — Other Ambulatory Visit: Payer: Self-pay

## 2022-10-03 ENCOUNTER — Telehealth (HOSPITAL_COMMUNITY): Payer: Self-pay

## 2022-10-03 ENCOUNTER — Emergency Department (HOSPITAL_COMMUNITY): Payer: No Typology Code available for payment source

## 2022-10-03 ENCOUNTER — Emergency Department (HOSPITAL_COMMUNITY)
Admission: EM | Admit: 2022-10-03 | Discharge: 2022-10-03 | Disposition: A | Discharge status: Home / Self Care | Payer: No Typology Code available for payment source | Attending: Emergency Medicine | Admitting: Emergency Medicine

## 2022-10-03 ENCOUNTER — Encounter (HOSPITAL_COMMUNITY): Payer: Self-pay

## 2022-10-03 DIAGNOSIS — Z7901 Long term (current) use of anticoagulants: Secondary | ICD-10-CM | POA: Insufficient documentation

## 2022-10-03 DIAGNOSIS — I491 Atrial premature depolarization: Secondary | ICD-10-CM | POA: Diagnosis not present

## 2022-10-03 DIAGNOSIS — L299 Pruritus, unspecified: Secondary | ICD-10-CM | POA: Diagnosis not present

## 2022-10-03 DIAGNOSIS — J45909 Unspecified asthma, uncomplicated: Secondary | ICD-10-CM | POA: Insufficient documentation

## 2022-10-03 DIAGNOSIS — I4891 Unspecified atrial fibrillation: Secondary | ICD-10-CM | POA: Diagnosis not present

## 2022-10-03 DIAGNOSIS — J449 Chronic obstructive pulmonary disease, unspecified: Secondary | ICD-10-CM | POA: Diagnosis not present

## 2022-10-03 DIAGNOSIS — I5042 Chronic combined systolic (congestive) and diastolic (congestive) heart failure: Secondary | ICD-10-CM | POA: Diagnosis not present

## 2022-10-03 DIAGNOSIS — I4510 Unspecified right bundle-branch block: Secondary | ICD-10-CM | POA: Diagnosis not present

## 2022-10-03 DIAGNOSIS — I110 Hypertensive heart disease with heart failure: Secondary | ICD-10-CM | POA: Insufficient documentation

## 2022-10-03 DIAGNOSIS — R079 Chest pain, unspecified: Secondary | ICD-10-CM | POA: Insufficient documentation

## 2022-10-03 DIAGNOSIS — R0789 Other chest pain: Secondary | ICD-10-CM | POA: Diagnosis not present

## 2022-10-03 LAB — BASIC METABOLIC PANEL
Anion gap: 12 (ref 5–15)
BUN: 15 mg/dL (ref 8–23)
CO2: 27 mmol/L (ref 22–32)
Calcium: 8.9 mg/dL (ref 8.9–10.3)
Chloride: 102 mmol/L (ref 98–111)
Creatinine, Ser: 1.15 mg/dL (ref 0.61–1.24)
GFR, Estimated: >60 mL/min (ref >60)
Glucose, Bld: 105 mg/dL — ABNORMAL HIGH (ref 70–99)
Potassium: 3.8 mmol/L (ref 3.5–5.1)
Sodium: 141 mmol/L (ref 135–145)

## 2022-10-03 LAB — TROPONIN I (HIGH SENSITIVITY)
Troponin I (High Sensitivity): 5 ng/L (ref <18)
Troponin I (High Sensitivity): 4 ng/L (ref <18)

## 2022-10-03 LAB — CBC
HCT: 46.1 % (ref 39.0–52.0)
Hemoglobin: 14.7 g/dL (ref 13.0–17.0)
MCH: 31.8 pg (ref 26.0–34.0)
MCHC: 31.9 g/dL (ref 30.0–36.0)
MCV: 99.8 fL (ref 80.0–100.0)
Platelets: 149 10*3/uL — ABNORMAL LOW (ref 150–400)
RBC: 4.62 MIL/uL (ref 4.22–5.81)
RDW: 13.7 % (ref 11.5–15.5)
WBC: 6.5 10*3/uL (ref 4.0–10.5)
nRBC: 0 % (ref 0.0–0.2)

## 2022-10-03 LAB — PROTIME-INR
INR: 2.3 — ABNORMAL HIGH (ref 0.8–1.2)
Prothrombin Time: 25.5 seconds — ABNORMAL HIGH (ref 11.4–15.2)

## 2022-10-03 MED ORDER — HYDROXYZINE HCL 10 MG PO TABS
10.0000 mg | ORAL_TABLET | Freq: Once | ORAL | Status: AC
  Administered 2022-10-03: 10 mg via ORAL
  Filled 2022-10-03: qty 1

## 2022-10-03 NOTE — Telephone Encounter (Signed)
Spoke to Ruben Murray on the phone reporting that he is home from the ER after they discovered he is having some issues with his device. I will forward this to [facility] triage and see if he can be seen sooner than 10/23/22 with Otilio Saber. I will see him in the home in the morning around 0900. He agreed with plan.   Maralyn Sago, EMT-Paramedic [phone] 10/03/2022

## 2022-10-03 NOTE — ED Notes (Signed)
Fax from [employer] placed on Dr. Burr Medico desk.

## 2022-10-03 NOTE — Telephone Encounter (Signed)
Received a call from [facility] advising that Mr. Hallquist notified her that he thought he was having a heart attack. I informed [name] to call 911 and for [facility] to respond and to call back if they needed any further information. Will follow up later today with same.   Benson Setting EMT-P Community Paramedic  [phone]

## 2022-10-03 NOTE — ED Provider Notes (Signed)
[place] EMERGENCY DEPARTMENT AT [facility] Provider Note   CSN: [id number] Arrival date & time: 10/03/22  4782     History {Add pertinent medical, surgical, social history, OB history to HPI:1} Chief Complaint  Patient presents with   Chest Pain    Ruben Murray is a 77 y.o. male.  HPI       77 year old male with a history of chronic combined CHF/nonischemic cardiomyopathy, permanent atrial fibrillation on chronic anticoagulation/Coumadin, OSA, history of PE, hypertension, asthma, aortic root dilation, ICD who presents with chest pain  Sat up and had sharp and dull pain in chest, was severe, thought about heart attack. Middle of chest, nothing made it better or worse at the time, gave him aspirin in the ambulance and the was better, and continued to get better.  Was reaching back to scratch back at the time.  Had associated shortness of breath (has it all the time anyway with exertion but felt that way while laying in the bed) now is feeling better as well.  No nausea or vomiting.  No lightheadedness like going to pass out, just pain.   Lost wife in January.   Was just at PCPs office yesterday and coumadin clinic the day before.    Past Medical History:  Diagnosis Date   Acquired thrombophilia (HCC) 01/15/2019   Aortic atherosclerosis (HCC)    Aortic root dilatation (HCC)    CAP (community acquired pneumonia) 03/06/2016   Chronic laryngitis 10/03/2016   Chronic systolic CHF (congestive heart failure) (HCC)    COPD GOLD II  08/02/2016   Quit smoking 2000  PFT's  07/10/2016  FEV1 1.98 (70 % ) ratio 67  p 19 % improvement from saba p nothing  prior to study while of coreg so rec as of 08/02/2016 try off coreg and on bisoprolol      Essential hypertension 03/06/2016   Changed from coreg to bisoprolol due to copd with reversible component  08/02/2016 >>>    Hoarseness 08/31/2016   Referred to ent 08/31/2016 >>> seen 10/03/16 Jenne Pane dx gerd and rhinitis medicamentosa   >  improved on f/u 01/31/17 on gerd rx/ flonase and off afrin   Hyperlipidemia    Hypertension    Laryngopharyngeal reflux (LPR) 10/03/2016   Mitral regurgitation    Moderate persistent asthma 08/02/2016   Morbid (severe) obesity due to excess calories (HCC) 08/02/2016   Nasal polyps 10/03/2016   NICM (nonischemic cardiomyopathy) (HCC)    a. EF 40-45% in 04/2016, etiology not defined, managed medically.   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   Rhinitis medicamentosa 10/03/2016   Right thyroid nodule 03/06/2016   Seizures (HCC)    "take RX daily" (03/06/2016)   Sensorineural hearing loss (SNHL) of both ears 01/31/2017   Tricuspid regurgitation     Past Surgical History:  Procedure Laterality Date   BIV ICD INSERTION CRT-D N/A 02/03/2022   Procedure: BIV ICD INSERTION CRT-D;  Surgeon: Regan Lemming, MD;  Location: [facility] INVASIVE CV LAB;  Service: Cardiovascular;  Laterality: N/A;   CARDIOVERSION N/A 12/13/2017   Procedure: CARDIOVERSION;  Surgeon: Chilton Si, MD;  Location: [facility] ENDOSCOPY;  Service: Cardiovascular;  Laterality: N/A;   CARDIOVERSION N/A 09/25/2018   Procedure: CARDIOVERSION;  Surgeon: Chrystie Nose, MD;  Location: [facility] ENDOSCOPY;  Service: Cardiovascular;  Laterality: N/A;   CARDIOVERSION N/A 11/04/2018  Procedure: CARDIOVERSION;  Surgeon: Vesta Mixer, MD;  Location: [facility] ENDOSCOPY;  Service: Cardiovascular;  Laterality: N/A;   CARDIOVERSION N/A 04/11/2019   Procedure: CARDIOVERSION;  Surgeon: Lewayne Bunting, MD;  Location: [facility] ENDOSCOPY;  Service: Cardiovascular;  Laterality: N/A;   CIRCUMCISION     JOINT REPLACEMENT     PERCUTANEOUS PINNING TOE FRACTURE Left    "big toe   REPLACEMENT TOTAL KNEE Left    RIGHT/LEFT HEART CATH AND CORONARY ANGIOGRAPHY N/A 11/06/2019   Procedure: RIGHT/LEFT HEART CATH AND CORONARY ANGIOGRAPHY;  Surgeon: Lennette Bihari, MD;   Location: [facility] INVASIVE CV LAB;  Service: Cardiovascular;  Laterality: N/A;   TONSILLECTOMY AND ADENOIDECTOMY      Home Medications Prior to Admission medications   Medication Sig Start Date End Date Taking? Authorizing Provider  acetaminophen (TYLENOL) 500 MG tablet Take 1,000 mg by mouth at bedtime.    [provider]  amiodarone (PACERONE) 200 MG tablet Take 200 mg by mouth 2 (two) times daily. Patient not taking: Reported on 09/13/2022    [provider]  busPIRone (BUSPAR) 5 MG tablet Take 5 mg by mouth 2 (two) times daily. 10/26/21   [provider]  Carboxymethylcellulose Sod PF 0.5 % SOLN Place 1 drop into both eyes daily as needed (dry eyes).    [provider]  Cholecalciferol (VITAMIN D3) 25 MCG (1000 UT) CAPS Take 1,000 Units by mouth daily.    [provider]  escitalopram (LEXAPRO) 20 MG tablet Take 20 mg by mouth at bedtime.    [provider]  finasteride (PROSCAR) 5 MG tablet TAKE 1 TABLET (5 MG TOTAL) BY MOUTH DAILY AT 12 NOON. 05/09/22   Bensimhon, Bevelyn Buckles, MD  fluticasone (FLONASE) 50 MCG/ACT nasal spray Place 1 spray into both nostrils 2 (two) times daily. 10/24/17   [provider]  JARDIANCE 10 MG TABS tablet TAKE 1 TABLET BY MOUTH DAILY BEFORE BREAKFAST. 11/17/21   Bensimhon, Bevelyn Buckles, MD  loratadine (CLARITIN) 10 MG tablet Take 10 mg by mouth every morning.    [provider]  losartan (COZAAR) 25 MG tablet TAKE 1/2 TABLET BY MOUTH EVERY DAY 11/03/21   Bensimhon, Bevelyn Buckles, MD  metoprolol succinate (TOPROL-XL) 100 MG 24 hr tablet Take 1 tablet (100 mg total) by mouth 2 (two) times daily. Take with or immediately following a meal. 01/31/22   Milford, Anderson Malta, FNP  Multiple Vitamin (MULTIVITAMIN WITH MINERALS) TABS tablet Take 1 tablet by mouth every morning.    [provider]  PHENobarbital (LUMINAL) 97.2 MG tablet Take 97.2 mg by mouth every evening. Takes between 3:00-5:00pm.    [provider]  potassium chloride SA (KLOR-CON M) 20 MEQ tablet Take 1 tablet (20 mEq total) by mouth daily. 12/21/21   Bensimhon, Bevelyn Buckles, MD  pravastatin (PRAVACHOL) 40 MG tablet Take 40 mg by mouth at bedtime.     [provider]  spironolactone (ALDACTONE) 25 MG tablet Take 0.5 tablets (12.5 mg total) by mouth daily. 03/15/22   Laurey Morale, MD  torsemide (DEMADEX) 20 MG tablet Take 2 tablets (40 mg total) by mouth daily. 09/04/22   Alen Bleacher, NP  triamcinolone cream (KENALOG) 0.1 % Apply 1 application  topically 2 (two) times daily as needed (rash). 05/13/21   [provider]  warfarin (COUMADIN) 5 MG tablet Take 5 mg by mouth at bedtime. 10/12/21         Allergies    Dilaudid [hydromorphone hcl], Hydromorphone, Morphine  sulfate, Tegretol [carbamazepine], and Carbamazepine    Review of Systems   Review of Systems  Physical Exam Updated Vital Signs BP 114/80   Pulse 87   Temp 97.8 F (36.6 C) (Oral)   Resp 16   Ht 5\' 8"  (1.727 m)   Wt 108.9 kg   SpO2 94%   BMI 36.49 kg/m  Physical Exam  ED Results / Procedures / Treatments   Labs (all labs ordered are listed, but only abnormal results are displayed) Labs Reviewed  CBC - Abnormal; Notable for the following components:      Result Value   Platelets 149 (*)    All other components within normal limits  PROTIME-INR - Abnormal; Notable for the following components:   Prothrombin Time 25.5 (*)    INR 2.3 (*)    All other components within normal limits  BASIC METABOLIC PANEL  TROPONIN I (HIGH SENSITIVITY)    EKG EKG Interpretation Date/Time:  Tuesday October 03 2022 09:38:10 EDT Ventricular Rate:  99 PR Interval:    QRS Duration:  165 QT Interval:  402 QTC Calculation: 516 R Axis:   -35  Text Interpretation: Atrial fibrillation Ventricular premature complex Right bundle branch block No significant change since last tracing Confirmed by Alvira Monday [phone]) on 10/03/2022 10:41:41  AM  Radiology DG Chest Portable 1 View  Result Date: 10/03/2022 CLINICAL DATA:  Chest pain EXAM: PORTABLE CHEST 1 VIEW COMPARISON:  X-ray 08/22/2022 FINDINGS: Stable left upper chest defibrillator. No consolidation, pneumothorax or effusion. No edema. Normal cardiopericardial silhouette. Overlapping cardiac leads. IMPRESSION: Defibrillator.  No acute cardiopulmonary disease Electronically Signed   By: Karen Kays M.D.   On: 10/03/2022 10:33    Procedures Procedures  {Document cardiac monitor, telemetry assessment procedure when appropriate:1}  Medications Ordered in ED Medications - No data to display  ED Course/ Medical Decision Making/ A&P   {   Click here for ABCD2, HEART and other calculatorsREFRESH Note before signing :1}                              Medical Decision Making Amount and/or Complexity of Data Reviewed Labs: ordered. Radiology: ordered.   ***  {Document critical care time when appropriate:1} {Document review of labs and clinical decision tools ie heart score, Chads2Vasc2 etc:1}  {Document your independent review of radiology images, and any outside records:1} {Document your discussion with family members, caretakers, and with consultants:1} {Document social determinants of health affecting pt's care:1} {Document your decision making why or why not admission, treatments were needed:1} Final Clinical Impression(s) / ED Diagnoses Final diagnoses:  None    Rx / DC Orders ED Discharge Orders     None

## 2022-10-03 NOTE — ED Triage Notes (Addendum)
PT BIBEMS from home w/ c/o cp that began this 0800, for thirty minutes. Denies radiation. Pain is reproducible with palpation.   324 ASA  130/82 Afib with pacemaker - on demand - just placed 6 weeks ago  On warfarin

## 2022-10-03 NOTE — ED Notes (Signed)
Per [employer] pacemaker is not firing more than 60% of the time and he is not being rate control. MD notified

## 2022-10-03 NOTE — ED Notes (Signed)
Pt reports increased SOB with exertion.

## 2022-10-04 ENCOUNTER — Other Ambulatory Visit (HOSPITAL_COMMUNITY): Payer: Self-pay

## 2022-10-04 DIAGNOSIS — R079 Chest pain, unspecified: Secondary | ICD-10-CM | POA: Diagnosis not present

## 2022-10-04 NOTE — Progress Notes (Signed)
Paramedicine Encounter    Patient ID: Ruben Murray, male    DOB: 07/18/1945, 77 y.o.   MRN: [id number]   Complaints- left foot and ankle pain with some slight left ankle swelling- possibly gout related.  Assessment- CAOX4, warm and dry laying in bed reporting to be feeling okay other than having some left foot and ankle pain- he fears this is gout related. Lungs clear, no pitting edema, heart rate irregular but controlled, vitals obtained. Went to ER yesterday and reports feeling no chest pain or dizziness since.   Compliance with meds- missed all meds yesterday morning, noon, evening, night due to ER visit.   Pill box filled- for one week   Refills needed-  Buspar Phenobarbital   Meds changes since last visit- none     Social changes- none    VISIT SUMMARY- Arrived for home visit for Ruben Murray who reports to be feeling okay today despite some left foot and ankle pain which he fears gout might be flaring up- I advised him if this worsened to call his PCP office. He agreed with this plan. I obtained assessment and vitals as noted. I spoke to device clinic RN and we discussed appointment with AFIB clinic tomorrow since he can't get into EP office soon. He will be seen in AFIB clinic tomorrow at 1130. His friend Ruben Murray will be taking him. He reports having some itching and has been without his Loratadine for a few weeks- he has benadryl in the home and I recommended him use this as needed but report to PCP if this issue is ongoing. He agreed with plan. We reviewed meds and filled pill box for one week. I reviewed appointments with him and wrote down same. I plan to follow up in the home in one week. Visit complete.    BP 102/60   Pulse 74   Resp 16   Wt 247 lb (112 kg)   SpO2 97%   BMI 37.56 kg/m  Weight yesterday-- 247lbs Last visit weight-- 246lbs     ACTION: Home visit completed     Patient Care Team: Aliene Beams, MD as PCP - General (Family Medicine) Jake Bathe, MD as PCP - Cardiology (Cardiology) Regan Lemming, MD as PCP - Electrophysiology (Cardiology) Aliene Beams, MD (Family Medicine) [facility] as Case Manager (Hospice and Palliative Medicine)  Patient Active Problem List   Diagnosis Date Noted   CHF (congestive heart failure) (HCC) 02/03/2022   Atrial fibrillation (HCC) 11/02/2021   Long term (current) use of anticoagulants 11/02/2021   AKI (acute kidney injury) (HCC) 07/27/2021   Acute pulmonary embolism (HCC) 07/26/2021   Acute on chronic systolic CHF (congestive heart failure) (HCC) 05/17/2021   Hypercoagulable state (HCC) 05/10/2021   Left leg pain 04/24/2021   OSA on CPAP 04/24/2021   Mitral regurgitation 04/24/2021   Tricuspid regurgitation 04/24/2021   Dilated aortic root (HCC) 04/24/2021   Permanent atrial fibrillation (HCC) 04/19/2021   Chronic arthropathy 04/13/2020   Callus 04/13/2020   Onychomycosis 12/12/2019   Left leg cellulitis 12/12/2019   Chronic systolic heart failure (HCC) 12/09/2019   Dilated cardiomyopathy (HCC)    Atrial flutter (HCC) 11/03/2019   Atrial flutter with rapid ventricular response (HCC) 11/03/2019   Acquired thrombophilia (HCC) 01/15/2019   Atrial fibrillation with rapid ventricular response (HCC) 10/29/2017   Chronic combined systolic and diastolic heart failure (HCC) 10/29/2017   Prolonged QT interval 10/29/2017   Sensorineural hearing loss (SNHL) of both ears 01/31/2017  Dysphonia 10/03/2016   Laryngopharyngeal reflux (LPR) 10/03/2016   Nasal polyps 10/03/2016   Rhinitis medicamentosa 10/03/2016   Chronic laryngitis 10/03/2016   Hoarseness 08/31/2016   Moderate persistent asthma 08/02/2016   COPD (chronic obstructive pulmonary disease) (HCC) 08/02/2016   Morbid (severe) obesity due to excess calories (HCC) 08/02/2016   Community acquired pneumonia 03/06/2016   Essential hypertension 03/06/2016   Mixed hyperlipidemia 03/06/2016   Right thyroid nodule  03/06/2016   Seizures (HCC) 03/06/2016   Fever 03/12/2015   Recurrent erosion of cornea 06/23/2011    Current Outpatient Medications:    acetaminophen (TYLENOL) 500 MG tablet, Take 1,000 mg by mouth at bedtime., Disp: , Rfl:    busPIRone (BUSPAR) 5 MG tablet, Take 5 mg by mouth 2 (two) times daily., Disp: , Rfl:    Carboxymethylcellulose Sod PF 0.5 % SOLN, Place 1 drop into both eyes daily as needed (dry eyes)., Disp: , Rfl:    Cholecalciferol (VITAMIN D3) 25 MCG (1000 UT) CAPS, Take 1,000 Units by mouth daily., Disp: , Rfl:    escitalopram (LEXAPRO) 20 MG tablet, Take 20 mg by mouth at bedtime., Disp: , Rfl:    finasteride (PROSCAR) 5 MG tablet, TAKE 1 TABLET (5 MG TOTAL) BY MOUTH DAILY AT 12 NOON., Disp: 90 tablet, Rfl: 1   fluticasone (FLONASE) 50 MCG/ACT nasal spray, Place 1 spray into both nostrils 2 (two) times daily., Disp: , Rfl:    JARDIANCE 10 MG TABS tablet, TAKE 1 TABLET BY MOUTH DAILY BEFORE BREAKFAST., Disp: 90 tablet, Rfl: 3   loratadine (CLARITIN) 10 MG tablet, Take 10 mg by mouth every morning., Disp: , Rfl:    losartan (COZAAR) 25 MG tablet, TAKE 1/2 TABLET BY MOUTH EVERY DAY, Disp: 45 tablet, Rfl: 3   metoprolol succinate (TOPROL-XL) 100 MG 24 hr tablet, Take 1 tablet (100 mg total) by mouth 2 (two) times daily. Take with or immediately following a meal., Disp: 180 tablet, Rfl: 3   Multiple Vitamin (MULTIVITAMIN WITH MINERALS) TABS tablet, Take 1 tablet by mouth every morning., Disp: , Rfl:    PHENobarbital (LUMINAL) 97.2 MG tablet, Take 97.2 mg by mouth every evening. Takes between 3:00-5:00pm., Disp: , Rfl:    potassium chloride SA (KLOR-CON M) 20 MEQ tablet, Take 1 tablet (20 mEq total) by mouth daily., Disp: 90 tablet, Rfl: 3   pravastatin (PRAVACHOL) 40 MG tablet, Take 40 mg by mouth at bedtime. , Disp: , Rfl:    spironolactone (ALDACTONE) 25 MG tablet, Take 0.5 tablets (12.5 mg total) by mouth daily., Disp: 45 tablet, Rfl: 2   torsemide (DEMADEX) 20 MG tablet, Take 2  tablets (40 mg total) by mouth daily., Disp: 180 tablet, Rfl: 3   triamcinolone cream (KENALOG) 0.1 %, Apply 1 application  topically 2 (two) times daily as needed (rash)., Disp: , Rfl:    warfarin (COUMADIN) 5 MG tablet, Take 5 mg by mouth at bedtime., Disp: , Rfl:    amiodarone (PACERONE) 200 MG tablet, Take 200 mg by mouth 2 (two) times daily. (Patient not taking: Reported on 09/13/2022), Disp: , Rfl:  Allergies  Allergen Reactions   Dilaudid [Hydromorphone Hcl] Other (See Comments)    Makes pt hyper    Hydromorphone Other (See Comments)    hyperactiviity Other reaction(s): made him wild Other reaction(s): Unknown   Morphine Sulfate     Other reaction(s): at high dose causes confusion Other reaction(s): at high dose causes confusion Other reaction(s): at high dose causes confusion, Other (See Comments) Other reaction(s):  at high dose causes confusion Other reaction(s): at high dose causes confusion    Tegretol [Carbamazepine] Hives   Carbamazepine Hives, Rash and Other (See Comments)    Other reaction(s): hives Other reaction(s): Hives Other reaction(s): hives     Social History   Socioeconomic History   Marital status: Married    Spouse name: Not on file   Number of children: Not on file   Years of education: Not on file   Highest education level: Not on file  Occupational History   Not on file  Tobacco Use   Smoking status: Former    Current packs/day: 0.00    Average packs/day: 3.0 packs/day for 48.0 years (144.0 ttl pk-yrs)    Types: Cigarettes    Start date: 60    Quit date: 2000    Years since quitting: 24.5   Smokeless tobacco: Never  Vaping Use   Vaping status: Never Used  Substance and Sexual Activity   Alcohol use: Never   Drug use: Never   Sexual activity: Never  Other Topics Concern   Not on file  Social History Narrative   ** Merged History Encounter **       Social Determinants of Health   Financial Resource Strain: Medium Risk (10/26/2021)    Overall Financial Resource Strain (CARDIA)    Difficulty of Paying Living Expenses: Somewhat hard  Food Insecurity: Low Risk  (07/21/2022)   [facility]   Food vital sign    Within the past 12 months, you worried that your food would run out before you got money to buy more: Never true    Within the past 12 months, the food you bought just didn't last and you didn't have money to get more. : Never true  Transportation Needs: No Transportation Needs (07/21/2022)   [facility]   Transportation    In the past 12 months, has lack of reliable transportation kept you from medical appointments, meetings, work or from getting things needed for daily living? : No  Physical Activity: Inactive (12/15/2019)   Exercise Vital Sign    Days of Exercise per Week: 0 days    Minutes of Exercise per Session: 0 min  Stress: Stress Concern Present (09/26/2018)   [employer] of Occupational Health - Occupational Stress Questionnaire    Feeling of Stress : To some extent  Social Connections: Not on file  Intimate Partner Violence: Not At Risk (02/04/2022)   Humiliation, Afraid, Rape, and Kick questionnaire    Fear of Current or Ex-Partner: No    Emotionally Abused: No    Physically Abused: No    Sexually Abused: No    Physical Exam      Future Appointments  Date Time Provider Department Center  10/05/2022 11:30 AM Eustace Pen, PA-C [facility] None  10/12/2022  8:00 PM Quintella Reichert, MD [facility]  10/23/2022  8:40 AM Graciella Freer, PA-C [facility]  10/23/2022  9:45 AM [facility] None  11/24/2022  7:00 AM [facility] DEVICE REMOTES [facility]  11/24/2022  1:15 PM Lenn Sink, DPM [facility]  11/24/2022  3:00 PM Jake Bathe, MD [facility]  02/23/2023  7:00 AM [facility] DEVICE REMOTES [facility]  05/25/2023  7:00 AM  [facility] DEVICE REMOTES [facility]  08/24/2023  7:00 AM [facility] DEVICE REMOTES [facility]

## 2022-10-04 NOTE — Telephone Encounter (Signed)
Spoke with Maralyn Sago EMT who is with patient now, patient agreeable to apt with AF clinic tomorrow at 11:30 to discuss AF with RVR/ ED visit.    Device alert for VT in the monitor zone 8 VT, 13 SVT, EGM's appear AF with RVR, Warfarin per EPIC, pt was seen in the ED 7/30 for CP Request for pt. to be seen earlier than 8/16 per EPIC

## 2022-10-05 ENCOUNTER — Ambulatory Visit (HOSPITAL_COMMUNITY)
Admission: RE | Admit: 2022-10-05 | Discharge: 2022-10-05 | Disposition: A | Discharge status: Home / Self Care | Payer: No Typology Code available for payment source | Source: Ambulatory Visit | Attending: Internal Medicine | Admitting: Internal Medicine

## 2022-10-05 ENCOUNTER — Encounter (HOSPITAL_COMMUNITY): Payer: Self-pay | Admitting: Internal Medicine

## 2022-10-05 VITALS — BP 124/70 | HR 90 | Ht 68.0 in | Wt 246.6 lb

## 2022-10-05 DIAGNOSIS — I110 Hypertensive heart disease with heart failure: Secondary | ICD-10-CM | POA: Diagnosis not present

## 2022-10-05 DIAGNOSIS — I5020 Unspecified systolic (congestive) heart failure: Secondary | ICD-10-CM | POA: Diagnosis not present

## 2022-10-05 DIAGNOSIS — I4821 Permanent atrial fibrillation: Secondary | ICD-10-CM | POA: Insufficient documentation

## 2022-10-05 DIAGNOSIS — D6869 Other thrombophilia: Secondary | ICD-10-CM | POA: Insufficient documentation

## 2022-10-05 DIAGNOSIS — Z79899 Other long term (current) drug therapy: Secondary | ICD-10-CM | POA: Diagnosis not present

## 2022-10-05 DIAGNOSIS — Z7901 Long term (current) use of anticoagulants: Secondary | ICD-10-CM | POA: Diagnosis not present

## 2022-10-05 NOTE — Addendum Note (Signed)
Encounter addended by: Eustace Pen, PA-C on: 10/05/2022 2:23 PM  Actions taken: Clinical Note Signed, In Basket message sent

## 2022-10-05 NOTE — Progress Notes (Addendum)
Primary Care Physician: Aliene Beams, MD Primary Cardiologist: Donato Schultz, MD Electrophysiologist: Will Jorja Loa, MD  Heart failure cardiologist: Arvilla Meres, MD    Referring Physician: Device clinic     Ruben Murray is a 77 y.o. male with a history of systolic HF with EF 20%, permanent atrial fibrillation, OSA, HTN, seizures, and s/p Abbott CRT-D implanted 02/03/22 for systolic heart failure who presents for consultation in the [facility]. He was seen in ED on 7/30 for chest pain and device interrogation 7/30 showed 8 VT, 13 SVT, and EGM's appear Afib with RVR. Patient is on warfarin for a CHADS2VASC score of 4.  On evaluation today, he feels okay overall. He is in permanent atrial fibrillation. He is taking metoprolol 100 mg BID. Review of records show in February 2024 Dr. Elberta Fortis discontinued amiodarone due to patient being in permanent atrial fibrillation. He is anticoagulated on warfarin.   Today, he denies symptoms of palpitations, chest pain, shortness of breath, orthopnea, PND, lower extremity edema, dizziness, presyncope, syncope, snoring, daytime somnolence, bleeding, or neurologic sequela. The patient is tolerating medications without difficulties and is otherwise without complaint today.    Atrial Fibrillation Risk Factors:  he does have symptoms or diagnosis of sleep apnea. he is compliant with CPAP therapy.  he has a BMI of Body mass index is 37.5 kg/m.Marland Kitchen Filed Weights   10/05/22 1050  Weight: 111.9 kg    Current Outpatient Medications  Medication Sig Dispense Refill   acetaminophen (TYLENOL) 500 MG tablet Take 1,000 mg by mouth at bedtime.     busPIRone (BUSPAR) 5 MG tablet Take 5 mg by mouth 2 (two) times daily.     Carboxymethylcellulose Sod PF 0.5 % SOLN Place 1 drop into both eyes daily as needed (dry eyes).     Cholecalciferol (VITAMIN D3) 25 MCG (1000 UT) CAPS Take 1,000 Units by mouth daily.     escitalopram  (LEXAPRO) 20 MG tablet Take 20 mg by mouth at bedtime.     finasteride (PROSCAR) 5 MG tablet TAKE 1 TABLET (5 MG TOTAL) BY MOUTH DAILY AT 12 NOON. 90 tablet 1   fluticasone (FLONASE) 50 MCG/ACT nasal spray Place 1 spray into both nostrils 2 (two) times daily.     JARDIANCE 10 MG TABS tablet TAKE 1 TABLET BY MOUTH DAILY BEFORE BREAKFAST. 90 tablet 3   loratadine (CLARITIN) 10 MG tablet Take 10 mg by mouth every morning.     losartan (COZAAR) 25 MG tablet TAKE 1/2 TABLET BY MOUTH EVERY DAY 45 tablet 3   metoprolol succinate (TOPROL-XL) 100 MG 24 hr tablet Take 1 tablet (100 mg total) by mouth 2 (two) times daily. Take with or immediately following a meal. 180 tablet 3   Multiple Vitamin (MULTIVITAMIN WITH MINERALS) TABS tablet Take 1 tablet by mouth every morning.     PHENobarbital (LUMINAL) 97.2 MG tablet Take 97.2 mg by mouth every evening. Takes between 3:00-5:00pm.     potassium chloride SA (KLOR-CON M) 20 MEQ tablet Take 1 tablet (20 mEq total) by mouth daily. 90 tablet 3   pravastatin (PRAVACHOL) 40 MG tablet Take 40 mg by mouth at bedtime.      spironolactone (ALDACTONE) 25 MG tablet Take 0.5 tablets (12.5 mg total) by mouth daily. 45 tablet 2   torsemide (DEMADEX) 20 MG tablet Take 2 tablets (40 mg total) by mouth daily. 180 tablet 3   triamcinolone cream (KENALOG) 0.1 % Apply 1 application  topically 2 (two)  times daily as needed (rash).     warfarin (COUMADIN) 5 MG tablet Take 5 mg by mouth at bedtime.     amiodarone (PACERONE) 200 MG tablet Take 200 mg by mouth 2 (two) times daily. (Patient not taking: Reported on 10/05/2022)     No current facility-administered medications for this encounter.    Atrial Fibrillation Management history:  Previous antiarrhythmic drugs: amiodarone Previous cardioversions: multiple Previous ablations: None Anticoagulation history: warfarin   ROS- All systems are reviewed and negative except as per the HPI above.  Physical Exam: BP 124/70   Pulse 90    Ht 5\' 8"  (1.727 m)   Wt 111.9 kg   BMI 37.50 kg/m   GEN: Well nourished, well developed in no acute distress NECK: No JVD; No carotid bruits CARDIAC: Irregularly irregular rate and rhythm, no murmurs, rubs, gallops RESPIRATORY:  Clear to auscultation without rales, wheezing or rhonchi  ABDOMEN: Soft, non-tender, non-distended EXTREMITIES:  No edema; No deformity   EKG today demonstrates  Vent. rate 90 BPM PR interval * ms QRS duration 158 ms QT/QTcB 414/506 ms P-R-T axes * -71 -29 Atrial fibrillation Left axis deviation Right bundle branch block Abnormal ECG When compared with ECG of 03-Oct-2022 09:38, PREVIOUS ECG IS PRESENT  Echo 06/21/22 demonstrated: 1. Left ventricular ejection fraction, by estimation, is 20 to 25%. The  left ventricle has severely decreased function. The left ventricle has no  regional wall motion abnormalities. The left ventricular internal cavity  size was moderately dilated. Left  ventricular diastolic parameters are indeterminate.   2. Right ventricular systolic function is normal. The right ventricular  size is moderately enlarged. There is normal pulmonary artery systolic  pressure.   3. Right atrial size was severely dilated.   4. The mitral valve is normal in structure. Mild mitral valve  regurgitation. No evidence of mitral stenosis.   5. Tricuspid valve regurgitation is moderate.   6. The aortic valve is tricuspid. Aortic valve regurgitation is mild to  moderate. No aortic stenosis is present.   7. The inferior vena cava is normal in size with greater than 50%  respiratory variability, suggesting right atrial pressure of 3 mmHg.   ASSESSMENT & PLAN CHA2DS2-VASc Score = 6  The patient's score is based upon: CHF History: 1 HTN History: 1 Diabetes History: 0 Stroke History: 2 Vascular Disease History: 0 Age Score: 2 Gender Score: 0      ASSESSMENT AND PLAN: Permanent Atrial Fibrillation (ICD10:  I48.11) The patient's  CHA2DS2-VASc score is 6, indicating a 9.7% annual risk of stroke.    I will discuss with Dr. Elberta Fortis whether patient should be restarted on amiodarone for rate control and also given episodes of VT. If he agrees, will contact patient to notify and send new prescription for amiodarone 200 mg daily.  Continue Toprol 100 mg BID.  Cannot give diltiazem due to HF.   Addendum: After discussing case with Dr. Elberta Fortis, prefer AV nodal ablation over restarting amiodarone at this point. This will be discussed at upcoming appointment.   Secondary Hypercoagulable State (ICD10:  D68.69) The patient is at significant risk for stroke/thromboembolism based upon his CHA2DS2-VASc Score of 6.  Continue Warfarin (Coumadin).     Follow up as scheduled with Otilio Saber, PA-C.    [place] Bells, PA-C  [facility] [place] [address] [place], [place] [address] [phone]

## 2022-10-11 ENCOUNTER — Other Ambulatory Visit (HOSPITAL_COMMUNITY): Payer: Self-pay

## 2022-10-11 NOTE — Progress Notes (Signed)
Paramedicine Encounter    Patient ID: [name], male    DOB: [birth year], 77 y.o.   MRN: [id number]   Complaints- Shortness of breath on exertion- baseline for patient   Assessment- CAOX4, warm and dry seated in his bed reporting to be feeling okay today with no complaints aside from normal shortness of breath on exertion. No lower leg swelling, no weight gain in the last week, lungs clear, vitals obtained as noted.   Compliance with meds- no missed doses over the last week   Pill box filled- for one week   Refills needed- Buspar   Meds changes since last visit- none     Social changes- lack of transportation for tomorrows sleep study due to friend who normally takes him being in the hospital- I called and rescheduled sleep study for him for 11/15/22.    VISIT SUMMARY-  Arrived for home visit for Ruben Murray Murray who reports to be feeling well today with no new complaints aside from his chronic shortness of breath with exertion. He denied any dizziness, chest pain or swelling. No weight gain noted. No swelling noted. Lungs clear. Vitals stable. I reviewed meds and filled pill box for one week. He explained Ruben Murray Murray's wife Ruben Murray Murray is in the hospital so Ruben Murray Murray is unable to take Ruben Murray Murray to his sleep study tomorrow. I called and rescheduled the study for Sept. 11 at 8:00pm. Ruben Murray Murray grateful for same. We reviewed all upcoming appointments and confirmed same. Home visit complete. I will see Ruben Murray Murray in one week. He agreed with plan.   There were no vitals taken for this visit. Weight yesterday-- 249lbs Last visit weight-- 247lbs     ACTION: Home visit completed     Patient Care Team: Aliene Beams, MD as PCP - General (Family Medicine) Jake Bathe, MD as PCP - Cardiology (Cardiology) Regan Lemming, MD as PCP - Electrophysiology (Cardiology) Aliene Beams, MD (Family Medicine) [place] as Case Manager (Hospice and Palliative Medicine)  Patient Active  Problem List   Diagnosis Date Noted   CHF (congestive heart failure) (HCC) 02/03/2022   Atrial fibrillation (HCC) 11/02/2021   Long term (current) use of anticoagulants 11/02/2021   AKI (acute kidney injury) (HCC) 07/27/2021   Acute pulmonary embolism (HCC) 07/26/2021   Acute on chronic systolic CHF (congestive heart failure) (HCC) 05/17/2021   Hypercoagulable state due to permanent atrial fibrillation (HCC) 05/10/2021   Left leg pain 04/24/2021   OSA on CPAP 04/24/2021   Mitral regurgitation 04/24/2021   Tricuspid regurgitation 04/24/2021   Dilated aortic root (HCC) 04/24/2021   Permanent atrial fibrillation (HCC) 04/19/2021   Chronic arthropathy 04/13/2020   Callus 04/13/2020   Onychomycosis 12/12/2019   Left leg cellulitis 12/12/2019   Chronic systolic heart failure (HCC) 12/09/2019   Dilated cardiomyopathy (HCC)    Atrial flutter (HCC) 11/03/2019   Atrial flutter with rapid ventricular response (HCC) 11/03/2019   Acquired thrombophilia (HCC) 01/15/2019   Atrial fibrillation with rapid ventricular response (HCC) 10/29/2017   Chronic combined systolic and diastolic heart failure (HCC) 10/29/2017   Prolonged QT interval 10/29/2017   Sensorineural hearing loss (SNHL) of both ears 01/31/2017   Dysphonia 10/03/2016   Laryngopharyngeal reflux (LPR) 10/03/2016   Nasal polyps 10/03/2016   Rhinitis medicamentosa 10/03/2016   Chronic laryngitis 10/03/2016   Hoarseness 08/31/2016   Moderate persistent asthma 08/02/2016   COPD (chronic obstructive pulmonary disease) (HCC) 08/02/2016   Morbid (severe) obesity due to excess calories (HCC) 08/02/2016   Community  acquired pneumonia 03/06/2016   Essential hypertension 03/06/2016   Mixed hyperlipidemia 03/06/2016   Right thyroid nodule 03/06/2016   Seizures (HCC) 03/06/2016   Fever 03/12/2015   Recurrent erosion of cornea 06/23/2011    Current Outpatient Medications:    acetaminophen (TYLENOL) 500 MG tablet, Take 1,000 mg by mouth at  bedtime., Disp: , Rfl:    amiodarone (PACERONE) 200 MG tablet, Take 200 mg by mouth 2 (two) times daily. (Patient not taking: Reported on 10/05/2022), Disp: , Rfl:    busPIRone (BUSPAR) 5 MG tablet, Take 5 mg by mouth 2 (two) times daily., Disp: , Rfl:    Carboxymethylcellulose Sod PF 0.5 % SOLN, Place 1 drop into both eyes daily as needed (dry eyes)., Disp: , Rfl:    Cholecalciferol (VITAMIN D3) 25 MCG (1000 UT) CAPS, Take 1,000 Units by mouth daily., Disp: , Rfl:    escitalopram (LEXAPRO) 20 MG tablet, Take 20 mg by mouth at bedtime., Disp: , Rfl:    finasteride (PROSCAR) 5 MG tablet, TAKE 1 TABLET (5 MG TOTAL) BY MOUTH DAILY AT 12 NOON., Disp: 90 tablet, Rfl: 1   fluticasone (FLONASE) 50 MCG/ACT nasal spray, Place 1 spray into both nostrils 2 (two) times daily., Disp: , Rfl:    JARDIANCE 10 MG TABS tablet, TAKE 1 TABLET BY MOUTH DAILY BEFORE BREAKFAST., Disp: 90 tablet, Rfl: 3   loratadine (CLARITIN) 10 MG tablet, Take 10 mg by mouth every morning., Disp: , Rfl:    losartan (COZAAR) 25 MG tablet, TAKE 1/2 TABLET BY MOUTH EVERY DAY, Disp: 45 tablet, Rfl: 3   metoprolol succinate (TOPROL-XL) 100 MG 24 hr tablet, Take 1 tablet (100 mg total) by mouth 2 (two) times daily. Take with or immediately following a meal., Disp: 180 tablet, Rfl: 3   Multiple Vitamin (MULTIVITAMIN WITH MINERALS) TABS tablet, Take 1 tablet by mouth every morning., Disp: , Rfl:    PHENobarbital (LUMINAL) 97.2 MG tablet, Take 97.2 mg by mouth every evening. Takes between 3:00-5:00pm., Disp: , Rfl:    potassium chloride SA (KLOR-CON M) 20 MEQ tablet, Take 1 tablet (20 mEq total) by mouth daily., Disp: 90 tablet, Rfl: 3   pravastatin (PRAVACHOL) 40 MG tablet, Take 40 mg by mouth at bedtime. , Disp: , Rfl:    spironolactone (ALDACTONE) 25 MG tablet, Take 0.5 tablets (12.5 mg total) by mouth daily., Disp: 45 tablet, Rfl: 2   torsemide (DEMADEX) 20 MG tablet, Take 2 tablets (40 mg total) by mouth daily., Disp: 180 tablet, Rfl: 3    triamcinolone cream (KENALOG) 0.1 %, Apply 1 application  topically 2 (two) times daily as needed (rash)., Disp: , Rfl:    warfarin (COUMADIN) 5 MG tablet, Take 5 mg by mouth at bedtime., Disp: , Rfl:  Allergies  Allergen Reactions   Dilaudid [Hydromorphone Hcl] Other (See Comments)    Makes pt hyper    Hydromorphone Other (See Comments)    hyperactiviity Other reaction(s): made him wild Other reaction(s): Unknown   Morphine Sulfate     Other reaction(s): at high dose causes confusion Other reaction(s): at high dose causes confusion Other reaction(s): at high dose causes confusion, Other (See Comments) Other reaction(s): at high dose causes confusion Other reaction(s): at high dose causes confusion    Tegretol [Carbamazepine] Hives   Carbamazepine Hives, Rash and Other (See Comments)    Other reaction(s): hives Other reaction(s): Hives Other reaction(s): hives     Social History   Socioeconomic History   Marital status: Married  Spouse name: Not on file   Number of children: Not on file   Years of education: Not on file   Highest education level: Not on file  Occupational History   Not on file  Tobacco Use   Smoking status: Former    Current packs/day: 0.00    Average packs/day: 3.0 packs/day for 48.0 years (144.0 ttl pk-yrs)    Types: Cigarettes    Start date: 88    Quit date: 2000    Years since quitting: 24.6   Smokeless tobacco: Never  Vaping Use   Vaping status: Never Used  Substance and Sexual Activity   Alcohol use: Never   Drug use: Never   Sexual activity: Never  Other Topics Concern   Not on file  Social History Narrative   ** Merged History Encounter **       Social Determinants of Health   Financial Resource Strain: Medium Risk (10/26/2021)   Overall Financial Resource Strain (CARDIA)    Difficulty of Paying Living Expenses: Somewhat hard  Food Insecurity: Low Risk  (07/21/2022)   [facility]   Food vital sign     Within the past 12 months, you worried that your food would run out before you got money to buy more: Never true    Within the past 12 months, the food you bought just didn't last and you didn't have money to get more. : Never true  Transportation Needs: No Transportation Needs (07/21/2022)   [facility]   Transportation    In the past 12 months, has lack of reliable transportation kept you from medical appointments, meetings, work or from getting things needed for daily living? : No  Physical Activity: Inactive (12/15/2019)   Exercise Vital Sign    Days of Exercise per Week: 0 days    Minutes of Exercise per Session: 0 min  Stress: Stress Concern Present (09/26/2018)   [employer] of Occupational Health - Occupational Stress Questionnaire    Feeling of Stress : To some extent  Social Connections: Not on file  Intimate Partner Violence: Not At Risk (02/04/2022)   Humiliation, Afraid, Rape, and Kick questionnaire    Fear of Current or Ex-Partner: No    Emotionally Abused: No    Physically Abused: No    Sexually Abused: No    Physical Exam      Future Appointments  Date Time Provider Department Center  10/12/2022  8:00 PM Quintella Reichert, MD [facility]  10/23/2022  8:40 AM Graciella Freer, PA-C [facility]  10/23/2022  9:45 AM [facility] None  11/24/2022  7:00 AM [facility]  11/24/2022  1:15 PM Lenn Sink, DPM [facility]  11/24/2022  3:00 PM Jake Bathe, MD [facility]  02/23/2023  7:00 AM [facility]  05/25/2023  7:00 AM [facility]  08/24/2023  7:00 AM [facility] DEVICE REMOTES [facility]

## 2022-10-12 ENCOUNTER — Encounter (HOSPITAL_BASED_OUTPATIENT_CLINIC_OR_DEPARTMENT_OTHER): Payer: No Typology Code available for payment source | Admitting: Cardiology

## 2022-10-16 ENCOUNTER — Telehealth (HOSPITAL_COMMUNITY): Payer: Self-pay | Admitting: Emergency Medicine

## 2022-10-16 NOTE — Telephone Encounter (Signed)
Pt has office visit scheduled with Otilio Saber, PA on 10/23/22 at 8:40 and [facility] appt on 10/23/22 at 9:45. Pt states he is aware of both appts and he he will have time to come to both appts.

## 2022-10-16 NOTE — Telephone Encounter (Signed)
Spoke with Ruben Murray and he was concerned with a potential conflict with 2x same day appointments. Pt was wondering if he could just have his coumadin levels drawn at his [facility] appointment to decrease sticks and stress of back to back appointments.   Benson Setting EMT-P Community Paramedic  [phone]

## 2022-10-18 ENCOUNTER — Other Ambulatory Visit (HOSPITAL_COMMUNITY): Payer: Self-pay | Admitting: Emergency Medicine

## 2022-10-18 DIAGNOSIS — I5042 Chronic combined systolic (congestive) and diastolic (congestive) heart failure: Secondary | ICD-10-CM | POA: Diagnosis not present

## 2022-10-18 NOTE — Progress Notes (Signed)
Paramedicine Encounter    Patient ID: Ruben Murray, male    DOB: 15-Apr-1945, 77 y.o.   MRN: [id number]   Complaints - increased weakness while at rest today. Reports baseline shortness of breath, denied chest pain. Pt reports wobbliness, concerned with unsteadiness due to increased arthritic pain.   Assessment - Lung sounds clear, Alert and oriented. +1pedal edema, good skin color- pink warm and dry  Compliance with meds - missed sat Bed time dose   Pill box filled - fpr 2x weeks   Refills needed - multivitamin, phenobarbital  Meds changes since last visit - none- will follow up after INR check on 8/19    Social changes - none    VISIT SUMMARY**  Met with Ruben Murray in the home. Pt reported that he felt more wobbily than his normal, attributing to increased arthritic pain in legs. Pt reports over fatigue from maneuvering with same. Pt reports continued shortness of breath upon exertion as baseline for pt. Lung sounds noted to be clear. Pt able to speak in full and complete sentences without increased dyspnea. Pt was advised to use his walker if it assists his safety in the home. Pt understood and agreed. Pt was evaluated as noted to find no increased edema. Ruben Murray was advised that I will be out of the office next week and that another community paramedic will follow up with him after his coumadin appointment to adjust his coumadin dose as necessary. Pt was informed of resources if he needed to follow up further. Pill box was filled for 2x weeks. Upcoming appointments reviewed. I will follow up in 2x weeks.     BP 110/88   Pulse 86   Resp 16   Wt 247 lb (112 kg)   SpO2 96%   BMI 37.56 kg/m  Weight yesterday-250lb Last visit weight- 246lb     ACTION: Home visit completed   Benson Setting EMT-P Community Paramedic  [phone]     Patient Care Team: Aliene Beams, MD as PCP - General (Family Medicine) Jake Bathe, MD as PCP - Cardiology (Cardiology) Regan Lemming, MD as PCP - Electrophysiology (Cardiology) Aliene Beams, MD (Family Medicine) [place] as Case Manager (Hospice and Palliative Medicine)  Patient Active Problem List   Diagnosis Date Noted   CHF (congestive heart failure) (HCC) 02/03/2022   Atrial fibrillation (HCC) 11/02/2021   Long term (current) use of anticoagulants 11/02/2021   AKI (acute kidney injury) (HCC) 07/27/2021   Acute pulmonary embolism (HCC) 07/26/2021   Acute on chronic systolic CHF (congestive heart failure) (HCC) 05/17/2021   Hypercoagulable state due to permanent atrial fibrillation (HCC) 05/10/2021   Left leg pain 04/24/2021   OSA on CPAP 04/24/2021   Mitral regurgitation 04/24/2021   Tricuspid regurgitation 04/24/2021   Dilated aortic root (HCC) 04/24/2021   Permanent atrial fibrillation (HCC) 04/19/2021   Chronic arthropathy 04/13/2020   Callus 04/13/2020   Onychomycosis 12/12/2019   Left leg cellulitis 12/12/2019   Chronic systolic heart failure (HCC) 12/09/2019   Dilated cardiomyopathy (HCC)    Atrial flutter (HCC) 11/03/2019   Atrial flutter with rapid ventricular response (HCC) 11/03/2019   Acquired thrombophilia (HCC) 01/15/2019   Atrial fibrillation with rapid ventricular response (HCC) 10/29/2017   Chronic combined systolic and diastolic heart failure (HCC) 10/29/2017   Prolonged QT interval 10/29/2017   Sensorineural hearing loss (SNHL) of both ears 01/31/2017   Dysphonia 10/03/2016   Laryngopharyngeal reflux (LPR) 10/03/2016   Nasal polyps 10/03/2016  Rhinitis medicamentosa 10/03/2016   Chronic laryngitis 10/03/2016   Hoarseness 08/31/2016   Moderate persistent asthma 08/02/2016   COPD (chronic obstructive pulmonary disease) (HCC) 08/02/2016   Morbid (severe) obesity due to excess calories (HCC) 08/02/2016   Community acquired pneumonia 03/06/2016   Essential hypertension 03/06/2016   Mixed hyperlipidemia 03/06/2016   Right thyroid nodule 03/06/2016    Seizures (HCC) 03/06/2016   Fever 03/12/2015   Recurrent erosion of cornea 06/23/2011    Current Outpatient Medications:    acetaminophen (TYLENOL) 500 MG tablet, Take 1,000 mg by mouth at bedtime., Disp: , Rfl:    busPIRone (BUSPAR) 5 MG tablet, Take 5 mg by mouth 2 (two) times daily., Disp: , Rfl:    Carboxymethylcellulose Sod PF 0.5 % SOLN, Place 1 drop into both eyes daily as needed (dry eyes)., Disp: , Rfl:    Cholecalciferol (VITAMIN D3) 25 MCG (1000 UT) CAPS, Take 1,000 Units by mouth daily., Disp: , Rfl:    escitalopram (LEXAPRO) 20 MG tablet, Take 20 mg by mouth at bedtime., Disp: , Rfl:    finasteride (PROSCAR) 5 MG tablet, TAKE 1 TABLET (5 MG TOTAL) BY MOUTH DAILY AT 12 NOON., Disp: 90 tablet, Rfl: 1   fluticasone (FLONASE) 50 MCG/ACT nasal spray, Place 1 spray into both nostrils 2 (two) times daily., Disp: , Rfl:    JARDIANCE 10 MG TABS tablet, TAKE 1 TABLET BY MOUTH DAILY BEFORE BREAKFAST., Disp: 90 tablet, Rfl: 3   losartan (COZAAR) 25 MG tablet, TAKE 1/2 TABLET BY MOUTH EVERY DAY, Disp: 45 tablet, Rfl: 3   metoprolol succinate (TOPROL-XL) 100 MG 24 hr tablet, Take 1 tablet (100 mg total) by mouth 2 (two) times daily. Take with or immediately following a meal., Disp: 180 tablet, Rfl: 3   Multiple Vitamin (MULTIVITAMIN WITH MINERALS) TABS tablet, Take 1 tablet by mouth every morning., Disp: , Rfl:    PHENobarbital (LUMINAL) 97.2 MG tablet, Take 97.2 mg by mouth every evening. Takes between 3:00-5:00pm., Disp: , Rfl:    potassium chloride SA (KLOR-CON M) 20 MEQ tablet, Take 1 tablet (20 mEq total) by mouth daily., Disp: 90 tablet, Rfl: 3   pravastatin (PRAVACHOL) 40 MG tablet, Take 40 mg by mouth at bedtime. , Disp: , Rfl:    spironolactone (ALDACTONE) 25 MG tablet, Take 0.5 tablets (12.5 mg total) by mouth daily., Disp: 45 tablet, Rfl: 2   torsemide (DEMADEX) 20 MG tablet, Take 2 tablets (40 mg total) by mouth daily., Disp: 180 tablet, Rfl: 3   triamcinolone cream (KENALOG) 0.1 %,  Apply 1 application  topically 2 (two) times daily as needed (rash)., Disp: , Rfl:    warfarin (COUMADIN) 5 MG tablet, Take 5 mg by mouth at bedtime., Disp: , Rfl:    amiodarone (PACERONE) 200 MG tablet, Take 200 mg by mouth 2 (two) times daily., Disp: , Rfl:    loratadine (CLARITIN) 10 MG tablet, Take 10 mg by mouth every morning. (Patient not taking: Reported on 10/11/2022), Disp: , Rfl:  Allergies  Allergen Reactions   Dilaudid [Hydromorphone Hcl] Other (See Comments)    Makes pt hyper    Hydromorphone Other (See Comments)    hyperactiviity Other reaction(s): made him wild Other reaction(s): Unknown   Morphine Sulfate     Other reaction(s): at high dose causes confusion Other reaction(s): at high dose causes confusion Other reaction(s): at high dose causes confusion, Other (See Comments) Other reaction(s): at high dose causes confusion Other reaction(s): at high dose causes confusion  Tegretol [Carbamazepine] Hives   Carbamazepine Hives, Rash and Other (See Comments)    Other reaction(s): hives Other reaction(s): Hives Other reaction(s): hives     Social History   Socioeconomic History   Marital status: Married    Spouse name: Not on file   Number of children: Not on file   Years of education: Not on file   Highest education level: Not on file  Occupational History   Not on file  Tobacco Use   Smoking status: Former    Current packs/day: 0.00    Average packs/day: 3.0 packs/day for 48.0 years (144.0 ttl pk-yrs)    Types: Cigarettes    Start date: 37    Quit date: 2000    Years since quitting: 24.6   Smokeless tobacco: Never  Vaping Use   Vaping status: Never Used  Substance and Sexual Activity   Alcohol use: Never   Drug use: Never   Sexual activity: Never  Other Topics Concern   Not on file  Social History Narrative   ** Merged History Encounter **       Social Determinants of Health   Financial Resource Strain: Medium Risk (10/26/2021)   Overall  Financial Resource Strain (CARDIA)    Difficulty of Paying Living Expenses: Somewhat hard  Food Insecurity: Low Risk  (07/21/2022)   [facility]   Food vital sign    Within the past 12 months, you worried that your food would run out before you got money to buy more: Never true    Within the past 12 months, the food you bought just didn't last and you didn't have money to get more. : Never true  Transportation Needs: No Transportation Needs (07/21/2022)   [facility]   Transportation    In the past 12 months, has lack of reliable transportation kept you from medical appointments, meetings, work or from getting things needed for daily living? : No  Physical Activity: Inactive (12/15/2019)   Exercise Vital Sign    Days of Exercise per Week: 0 days    Minutes of Exercise per Session: 0 min  Stress: Stress Concern Present (09/26/2018)   [employer] of Occupational Health - Occupational Stress Questionnaire    Feeling of Stress : To some extent  Social Connections: Not on file  Intimate Partner Violence: Not At Risk (02/04/2022)   Humiliation, Afraid, Rape, and Kick questionnaire    Fear of Current or Ex-Partner: No    Emotionally Abused: No    Physically Abused: No    Sexually Abused: No    Physical Exam      Future Appointments  Date Time Provider Department Center  10/23/2022  8:40 AM Graciella Freer, PA-C [facility]  10/23/2022  9:45 AM [facility] None  11/15/2022  8:00 PM Quintella Reichert, MD [facility]  11/24/2022  7:00 AM [facility]  11/24/2022  1:15 PM Lenn Sink, DPM [facility]  11/24/2022  3:00 PM Jake Bathe, MD [facility]  02/23/2023  7:00 AM [facility]  05/25/2023  7:00 AM [facility]  08/24/2023  7:00 AM  [facility]

## 2022-10-23 ENCOUNTER — Ambulatory Visit (HOSPITAL_COMMUNITY): Payer: No Typology Code available for payment source | Admitting: Anesthesiology

## 2022-10-23 ENCOUNTER — Ambulatory Visit (INDEPENDENT_AMBULATORY_CARE_PROVIDER_SITE_OTHER): Payer: No Typology Code available for payment source

## 2022-10-23 ENCOUNTER — Telehealth (HOSPITAL_COMMUNITY): Payer: Self-pay

## 2022-10-23 ENCOUNTER — Ambulatory Visit: Payer: No Typology Code available for payment source | Attending: Student | Admitting: Student

## 2022-10-23 ENCOUNTER — Other Ambulatory Visit: Payer: Self-pay

## 2022-10-23 ENCOUNTER — Encounter: Payer: Self-pay | Admitting: Student

## 2022-10-23 VITALS — BP 100/78 | HR 80 | Ht 68.0 in | Wt 254.4 lb

## 2022-10-23 DIAGNOSIS — Z7901 Long term (current) use of anticoagulants: Secondary | ICD-10-CM

## 2022-10-23 DIAGNOSIS — I4821 Permanent atrial fibrillation: Secondary | ICD-10-CM

## 2022-10-23 DIAGNOSIS — I5020 Unspecified systolic (congestive) heart failure: Secondary | ICD-10-CM

## 2022-10-23 DIAGNOSIS — I4891 Unspecified atrial fibrillation: Secondary | ICD-10-CM | POA: Diagnosis not present

## 2022-10-23 LAB — CUP PACEART INCLINIC DEVICE CHECK
Battery Remaining Longevity: 69 mo
Brady Statistic RA Percent Paced: 0 %
Brady Statistic RV Percent Paced: 41 %
Date Time Interrogation Session: 20240819100405
HighPow Impedance: 70.875
Implantable Lead Connection Status: 753985
Implantable Lead Connection Status: 753985
Implantable Lead Connection Status: 753985
Implantable Lead Implant Date: 20231201
Implantable Lead Implant Date: 20231201
Implantable Lead Implant Date: 20231201
Implantable Lead Location: 753858
Implantable Lead Location: 753859
Implantable Lead Location: 753860
Implantable Pulse Generator Implant Date: 20231201
Lead Channel Impedance Value: 450 Ohm
Lead Channel Impedance Value: 450 Ohm
Lead Channel Impedance Value: 587.5 Ohm
Lead Channel Pacing Threshold Amplitude: 0.75 V
Lead Channel Pacing Threshold Amplitude: 0.75 V
Lead Channel Pacing Threshold Amplitude: 2.5 V
Lead Channel Pacing Threshold Amplitude: 2.5 V
Lead Channel Pacing Threshold Pulse Width: 0.5 ms
Lead Channel Pacing Threshold Pulse Width: 0.5 ms
Lead Channel Pacing Threshold Pulse Width: 1 ms
Lead Channel Pacing Threshold Pulse Width: 1 ms
Lead Channel Sensing Intrinsic Amplitude: 11.1 mV
Lead Channel Setting Pacing Amplitude: 2.5 V
Lead Channel Setting Pacing Amplitude: 4 V
Lead Channel Setting Pacing Pulse Width: 0.5 ms
Lead Channel Setting Pacing Pulse Width: 1 ms
Lead Channel Setting Sensing Sensitivity: 0.5 mV
Pulse Gen Serial Number: 211010361
Zone Setting Status: 755011

## 2022-10-23 LAB — POCT INR: INR: 2.7 (ref 2.0–3.0)

## 2022-10-23 NOTE — Patient Instructions (Signed)
Medication Instructions:  Your physician recommends that you continue on your current medications as directed. Please refer to the Current Medication list given to you today.  *If you need a refill on your cardiac medications before your next appointment, please call your pharmacy*   Lab Work: TODAY at [facility] If you have labs (blood work) drawn today and your tests are completely normal, you will receive your results only by: MyChart Message (if you have MyChart) OR A paper copy in the mail If you have any lab test that is abnormal or we need to change your treatment, we will call you to review the results.   Testing/Procedures: None   Follow-Up: At [facility], you and your health needs are our priority.  As part of our continuing mission to provide you with exceptional heart care, we have created designated Provider Care Teams.  These Care Teams include your primary Cardiologist (physician) and Advanced Practice Providers (APPs -  Physician Assistants and Nurse Practitioners) who all work together to provide you with the care you need, when you need it.  We recommend signing up for the patient portal called "MyChart".  Sign up information is provided on this After Visit Summary.  MyChart is used to connect with patients for Virtual Visits (Telemedicine).  Patients are able to view lab/test results, encounter notes, upcoming appointments, etc.  Non-urgent messages can be sent to your provider as well.   To learn more about what you can do with MyChart, go to [email or web address].    Your next appointment:   After your procedure  Other Instructions See Instruction letter

## 2022-10-23 NOTE — Telephone Encounter (Signed)
Spoke to Mr. Trewin verifying his upcoming procedure instructions. We talked about medications to be stopped- Jardiance. We walked through taking out Jardiance of his pill box and confirmed same by verifying the pill bottle and identifying the pill and then removing that pill from Tuesday and Wednesdays spots in his pill box and returning those to the bottle.. We discussed other pre procedure instructions and verified this. He agreed with all.   Call complete.   Maralyn Sago, EMT-Paramedic [phone] 10/23/2022

## 2022-10-23 NOTE — Progress Notes (Signed)
  Electrophysiology Office Note:   ID:  Ruben Murray, Ruben Murray March 03, 1946, MRN [id number]  Primary Cardiologist: Donato Schultz, MD Electrophysiologist: Will Jorja Loa, MD      History of Present Illness:   Ruben Murray is a 77 y.o. male with h/o Obesity, HTN, OSA, h/o seizures, and permanent AF seen today for routine electrophysiology followup.   Seen in ED 7/30 for chest discomfort and symptomatic AF.    Seen in AF clinic 10/05/2022 and discussion to plan AV nodal ablation given pt already has CRT-D in place.   Since last being seen in our clinic the patient reports doing about the same. He has some days of fatigue and occasional tachy palpitations despite Toprol 100 mg BID. Has discussed resuming amiodarone for rate control, but as he also has sub-optimal BiV pacing, felt better to discuss and plan AV nodal ablation.   Review of systems complete and found to be negative unless listed in HPI.   EP Information / Studies Reviewed:    EKG is not ordered today. EKG from 10/05/2022 reviewed which showed AF with V pacing at 90 bpm . rS in V1 with Rs in lead 1.   ICD Interrogation-  reviewed in detail today,  See PACEART report.  Device History: Abbott BiV ICD implanted 02/2022 for NICM, LBBB  Physical Exam:   VS:  BP 100/78   Pulse 80   Ht 5\' 8"  (1.727 m)   Wt 254 lb 6.4 oz (115.4 kg)   SpO2 98%   BMI 38.68 kg/m    Wt Readings from Last 3 Encounters:  10/23/22 254 lb 6.4 oz (115.4 kg)  10/18/22 247 lb (112 kg)  10/11/22 246 lb 12.8 oz (111.9 kg)     GEN: Well nourished, well developed in no acute distress NECK: No JVD; No carotid bruits CARDIAC: Irregularly irregular rate and rhythm, no murmurs, rubs, gallops RESPIRATORY:  Clear to auscultation without rales, wheezing or rhonchi  ABDOMEN: Soft, non-tender, non-distended EXTREMITIES:  No edema; No deformity   ASSESSMENT AND PLAN:    Chronic systolic CHF s/p Abbott CRT-D Normal PPM function See Pace Art report No changes  today Echo 06/2022 LVEF 20-25% LV threshold is 2.5V @ 1.0 ms P4 to RV coil. Offered Optimization but pt is nearly late for another appointment. Will make industry and Dr. Elberta Fortis aware prior to procedure.   Permanent AF More issues with rates as of late, and symptoms. Continue coumadin for CHA2DS2/VASc of at least 7. Rates overall relatively controlled but with some R shift and very symptomatic at times.  Continue Toprol 100 mg BID    Disposition:   Follow up with Dr. Elberta Fortis as usual post procedure  Signed, Graciella Freer, PA-C

## 2022-10-23 NOTE — H&P (View-Only) (Signed)
  Electrophysiology Office Note:   ID:  Ruben Murray, Ruben Murray 30-Aug-1945, MRN [id number]  Primary Cardiologist: Donato Schultz, MD Electrophysiologist: Will Jorja Loa, MD      History of Present Illness:   Ruben Murray is a 77 y.o. male with h/o Obesity, HTN, OSA, h/o seizures, and permanent AF seen today for routine electrophysiology followup.   Seen in ED 7/30 for chest discomfort and symptomatic AF.    Seen in AF clinic 10/05/2022 and discussion to plan AV nodal ablation given pt already has CRT-D in place.   Since last being seen in our clinic the patient reports doing about the same. He has some days of fatigue and occasional tachy palpitations despite Toprol 100 mg BID. Has discussed resuming amiodarone for rate control, but as he also has sub-optimal BiV pacing, felt better to discuss and plan AV nodal ablation.   Review of systems complete and found to be negative unless listed in HPI.   EP Information / Studies Reviewed:    EKG is not ordered today. EKG from 10/05/2022 reviewed which showed AF with V pacing at 90 bpm . rS in V1 with Rs in lead 1.   ICD Interrogation-  reviewed in detail today,  See PACEART report.  Device History: Abbott BiV ICD implanted 02/2022 for NICM, LBBB  Physical Exam:   VS:  BP 100/78   Pulse 80   Ht 5\' 8"  (1.727 m)   Wt 254 lb 6.4 oz (115.4 kg)   SpO2 98%   BMI 38.68 kg/m    Wt Readings from Last 3 Encounters:  10/23/22 254 lb 6.4 oz (115.4 kg)  10/18/22 247 lb (112 kg)  10/11/22 246 lb 12.8 oz (111.9 kg)     GEN: Well nourished, well developed in no acute distress NECK: No JVD; No carotid bruits CARDIAC: Irregularly irregular rate and rhythm, no murmurs, rubs, gallops RESPIRATORY:  Clear to auscultation without rales, wheezing or rhonchi  ABDOMEN: Soft, non-tender, non-distended EXTREMITIES:  No edema; No deformity   ASSESSMENT AND PLAN:    Chronic systolic CHF s/p Abbott CRT-D Normal PPM function See Pace Art report No changes  today Echo 06/2022 LVEF 20-25% LV threshold is 2.5V @ 1.0 ms P4 to RV coil. Offered Optimization but pt is nearly late for another appointment. Will make industry and Dr. Elberta Fortis aware prior to procedure.   Permanent AF More issues with rates as of late, and symptoms. Continue coumadin for CHA2DS2/VASc of at least 7. Rates overall relatively controlled but with some R shift and very symptomatic at times.  Continue Toprol 100 mg BID    Disposition:   Follow up with Dr. Elberta Fortis as usual post procedure  Signed, Graciella Freer, PA-C

## 2022-10-23 NOTE — Telephone Encounter (Signed)
Medication [facility] [place]:  -Phenobarbital  (Needs refills- [facility])   Maralyn Sago, EMT-Paramedic [phone] 10/23/2022

## 2022-10-23 NOTE — Patient Instructions (Signed)
Continue taking warfarin 1 tablet daily except 1/2 tablet on Mondays and Fridays. Ablation 8/21 Recheck INR in 4 weeks.  Coumadin Clinic [phone] or [phone]

## 2022-10-24 ENCOUNTER — Telehealth (HOSPITAL_COMMUNITY): Payer: Self-pay

## 2022-10-24 ENCOUNTER — Telehealth: Payer: Self-pay

## 2022-10-24 ENCOUNTER — Other Ambulatory Visit (HOSPITAL_COMMUNITY): Payer: Self-pay | Admitting: Cardiology

## 2022-10-24 DIAGNOSIS — I5020 Unspecified systolic (congestive) heart failure: Secondary | ICD-10-CM

## 2022-10-24 LAB — BASIC METABOLIC PANEL
BUN/Creatinine Ratio: 13 (ref 10–24)
BUN: 18 mg/dL (ref 8–27)
CO2: 27 mmol/L (ref 20–29)
Calcium: 9.2 mg/dL (ref 8.6–10.2)
Chloride: 97 mmol/L (ref 96–106)
Creatinine, Ser: 1.42 mg/dL — ABNORMAL HIGH (ref 0.76–1.27)
Glucose: 113 mg/dL — ABNORMAL HIGH (ref 70–99)
Potassium: 4.9 mmol/L (ref 3.5–5.2)
Sodium: 139 mmol/L (ref 134–144)
eGFR: 50 mL/min/{1.73_m2} — ABNORMAL LOW (ref >59)

## 2022-10-24 LAB — CBC
Hematocrit: 45.3 % (ref 37.5–51.0)
Hemoglobin: 15.2 g/dL (ref 13.0–17.7)
MCH: 32 pg (ref 26.6–33.0)
MCHC: 33.6 g/dL (ref 31.5–35.7)
MCV: 95 fL (ref 79–97)
Platelets: 178 10*3/uL (ref 150–450)
RBC: 4.75 x10E6/uL (ref 4.14–5.80)
RDW: 13 % (ref 11.6–15.4)
WBC: 8.1 10*3/uL (ref 3.4–10.8)

## 2022-10-24 MED ORDER — POTASSIUM CHLORIDE CRYS ER 20 MEQ PO TBCR: 20.0000 meq | EXTENDED_RELEASE_TABLET | Freq: Every day | ORAL | 3 refills | Status: AC

## 2022-10-24 MED ORDER — EMPAGLIFLOZIN 10 MG PO TABS: 10.0000 mg | ORAL_TABLET | Freq: Every day | ORAL | 3 refills | Status: AC

## 2022-10-24 MED ORDER — FINASTERIDE 5 MG PO TABS: 5.0000 mg | ORAL_TABLET | Freq: Every day | ORAL | 1 refills | Status: AC

## 2022-10-24 MED ORDER — LOSARTAN POTASSIUM 25 MG PO TABS: 12.5000 mg | ORAL_TABLET | Freq: Every day | ORAL | 3 refills | Status: AC

## 2022-10-24 MED ORDER — METOPROLOL SUCCINATE ER 100 MG PO TB24: 100.0000 mg | ORAL_TABLET | Freq: Two times a day (BID) | ORAL | 3 refills | Status: DC

## 2022-10-24 MED ORDER — SPIRONOLACTONE 25 MG PO TABS: 12.5000 mg | ORAL_TABLET | Freq: Every day | ORAL | 2 refills | Status: AC

## 2022-10-24 NOTE — Telephone Encounter (Signed)
[name] reports [facility] reached out to her to confirm medications for Mr. Friedli upcoming procedure. I contacted [facility] and confirmed same for Mr. Warf. Med list updated.   Call complete.   Maralyn Sago, EMT-Paramedic [phone] 10/24/2022

## 2022-10-24 NOTE — Telephone Encounter (Signed)
Pt aware of time change - he is to arrive at [facility] at 3:30 PM.

## 2022-10-25 ENCOUNTER — Ambulatory Visit (HOSPITAL_COMMUNITY)
Admission: RE | Admit: 2022-10-25 | Discharge: 2022-10-26 | Disposition: A | Discharge status: Home / Self Care | Payer: No Typology Code available for payment source | Attending: Cardiology | Admitting: Cardiology

## 2022-10-25 ENCOUNTER — Other Ambulatory Visit: Payer: Self-pay

## 2022-10-25 ENCOUNTER — Encounter (HOSPITAL_COMMUNITY)
Admission: RE | Disposition: A | Discharge status: Home / Self Care | Payer: Self-pay | Source: Home / Self Care | Attending: Cardiology

## 2022-10-25 ENCOUNTER — Ambulatory Visit (HOSPITAL_COMMUNITY): Payer: Self-pay | Admitting: Anesthesiology

## 2022-10-25 DIAGNOSIS — Z79899 Other long term (current) drug therapy: Secondary | ICD-10-CM | POA: Diagnosis not present

## 2022-10-25 DIAGNOSIS — I447 Left bundle-branch block, unspecified: Secondary | ICD-10-CM | POA: Diagnosis not present

## 2022-10-25 DIAGNOSIS — G4733 Obstructive sleep apnea (adult) (pediatric): Secondary | ICD-10-CM | POA: Insufficient documentation

## 2022-10-25 DIAGNOSIS — I5022 Chronic systolic (congestive) heart failure: Secondary | ICD-10-CM | POA: Diagnosis not present

## 2022-10-25 DIAGNOSIS — G40909 Epilepsy, unspecified, not intractable, without status epilepticus: Secondary | ICD-10-CM | POA: Insufficient documentation

## 2022-10-25 DIAGNOSIS — I272 Pulmonary hypertension, unspecified: Secondary | ICD-10-CM | POA: Diagnosis not present

## 2022-10-25 DIAGNOSIS — I4891 Unspecified atrial fibrillation: Secondary | ICD-10-CM | POA: Diagnosis not present

## 2022-10-25 DIAGNOSIS — I4821 Permanent atrial fibrillation: Secondary | ICD-10-CM | POA: Diagnosis not present

## 2022-10-25 DIAGNOSIS — I110 Hypertensive heart disease with heart failure: Secondary | ICD-10-CM | POA: Diagnosis not present

## 2022-10-25 DIAGNOSIS — Z7901 Long term (current) use of anticoagulants: Secondary | ICD-10-CM | POA: Diagnosis not present

## 2022-10-25 DIAGNOSIS — Z95810 Presence of automatic (implantable) cardiac defibrillator: Secondary | ICD-10-CM | POA: Diagnosis not present

## 2022-10-25 DIAGNOSIS — I428 Other cardiomyopathies: Secondary | ICD-10-CM | POA: Diagnosis not present

## 2022-10-25 DIAGNOSIS — J449 Chronic obstructive pulmonary disease, unspecified: Secondary | ICD-10-CM | POA: Insufficient documentation

## 2022-10-25 HISTORY — PX: AV NODE ABLATION: EP1193

## 2022-10-25 LAB — GLUCOSE, CAPILLARY: Glucose-Capillary: 92 mg/dL (ref 70–99)

## 2022-10-25 LAB — PROTIME-INR
INR: 3.1 — ABNORMAL HIGH (ref 0.8–1.2)
Prothrombin Time: 32.1 seconds — ABNORMAL HIGH (ref 11.4–15.2)

## 2022-10-25 SURGERY — AV NODE ABLATION
Anesthesia: General

## 2022-10-25 MED ORDER — LIDOCAINE HCL 1 % IJ SOLN
INTRAMUSCULAR | Status: AC
  Filled 2022-10-25: qty 60

## 2022-10-25 MED ORDER — CEFAZOLIN SODIUM-DEXTROSE 2-3 GM-%(50ML) IV SOLR
INTRAVENOUS | Status: AC | PRN
  Administered 2022-10-25: 2 g via INTRAVENOUS

## 2022-10-25 MED ORDER — SPIRONOLACTONE 12.5 MG HALF TABLET
12.5000 mg | ORAL_TABLET | Freq: Every day | ORAL | Status: DC
  Administered 2022-10-26: 12.5 mg via ORAL
  Filled 2022-10-25: qty 1

## 2022-10-25 MED ORDER — ACETAMINOPHEN 500 MG PO TABS
1000.0000 mg | ORAL_TABLET | Freq: Every day | ORAL | Status: DC
  Administered 2022-10-25: 1000 mg via ORAL
  Filled 2022-10-25: qty 2

## 2022-10-25 MED ORDER — HEPARIN (PORCINE) IN NACL 1000-0.9 UT/500ML-% IV SOLN
INTRAVENOUS | Status: DC | PRN
  Administered 2022-10-25: 500 mL

## 2022-10-25 MED ORDER — BUPIVACAINE HCL (PF) 0.25 % IJ SOLN
INTRAMUSCULAR | Status: DC | PRN
  Administered 2022-10-25: 30 mL

## 2022-10-25 MED ORDER — LORATADINE 10 MG PO TABS
10.0000 mg | ORAL_TABLET | Freq: Every morning | ORAL | Status: DC
  Administered 2022-10-26: 10 mg via ORAL
  Filled 2022-10-25: qty 1

## 2022-10-25 MED ORDER — SODIUM CHLORIDE 0.9% FLUSH
3.0000 mL | Freq: Two times a day (BID) | INTRAVENOUS | Status: DC
  Administered 2022-10-25: 3 mL via INTRAVENOUS

## 2022-10-25 MED ORDER — SODIUM CHLORIDE 0.9 % IV SOLN: 250.0000 mL | INTRAVENOUS | Status: DC | PRN

## 2022-10-25 MED ORDER — PHENOBARBITAL 32.4 MG PO TABS
97.2000 mg | ORAL_TABLET | Freq: Every evening | ORAL | Status: DC
  Administered 2022-10-25: 97.2 mg via ORAL
  Filled 2022-10-25: qty 3

## 2022-10-25 MED ORDER — PRAVASTATIN SODIUM 40 MG PO TABS
40.0000 mg | ORAL_TABLET | Freq: Every day | ORAL | Status: DC
  Administered 2022-10-25: 40 mg via ORAL
  Filled 2022-10-25: qty 1

## 2022-10-25 MED ORDER — POLYVINYL ALCOHOL 1.4 % OP SOLN: 1.0000 [drp] | OPHTHALMIC | Status: DC | PRN

## 2022-10-25 MED ORDER — EMPAGLIFLOZIN 10 MG PO TABS
10.0000 mg | ORAL_TABLET | Freq: Every day | ORAL | Status: DC
  Administered 2022-10-26: 10 mg via ORAL
  Filled 2022-10-25: qty 1

## 2022-10-25 MED ORDER — FENTANYL CITRATE (PF) 100 MCG/2ML IJ SOLN
INTRAMUSCULAR | Status: DC | PRN
  Administered 2022-10-25 (×2): 25 ug via INTRAVENOUS

## 2022-10-25 MED ORDER — TORSEMIDE 20 MG PO TABS
40.0000 mg | ORAL_TABLET | Freq: Every day | ORAL | Status: DC
  Administered 2022-10-26: 40 mg via ORAL
  Filled 2022-10-25: qty 2

## 2022-10-25 MED ORDER — ACETAMINOPHEN 325 MG PO TABS: 650.0000 mg | ORAL_TABLET | ORAL | Status: DC | PRN

## 2022-10-25 MED ORDER — LIDOCAINE-EPINEPHRINE 1 %-1:100000 IJ SOLN
INTRAMUSCULAR | Status: AC
  Filled 2022-10-25: qty 3

## 2022-10-25 MED ORDER — FLUTICASONE PROPIONATE 50 MCG/ACT NA SUSP: 1.0000 | Freq: Every day | NASAL | Status: DC | PRN

## 2022-10-25 MED ORDER — SODIUM CHLORIDE 0.9% FLUSH: 3.0000 mL | INTRAVENOUS | Status: DC | PRN

## 2022-10-25 MED ORDER — VITAMIN D 25 MCG (1000 UNIT) PO TABS
1000.0000 [IU] | ORAL_TABLET | Freq: Every day | ORAL | Status: DC
  Administered 2022-10-26: 1000 [IU] via ORAL
  Filled 2022-10-25 (×2): qty 1

## 2022-10-25 MED ORDER — MIDAZOLAM HCL 5 MG/5ML IJ SOLN
INTRAMUSCULAR | Status: DC | PRN
  Administered 2022-10-25: 1 mg via INTRAVENOUS

## 2022-10-25 MED ORDER — ONDANSETRON HCL 4 MG/2ML IJ SOLN: 4.0000 mg | Freq: Four times a day (QID) | INTRAMUSCULAR | Status: DC | PRN

## 2022-10-25 MED ORDER — CEFAZOLIN SODIUM-DEXTROSE 2-4 GM/100ML-% IV SOLN
INTRAVENOUS | Status: AC
  Filled 2022-10-25: qty 100

## 2022-10-25 MED ORDER — POTASSIUM CHLORIDE CRYS ER 20 MEQ PO TBCR
20.0000 meq | EXTENDED_RELEASE_TABLET | Freq: Every day | ORAL | Status: DC
  Administered 2022-10-26: 20 meq via ORAL
  Filled 2022-10-25: qty 2

## 2022-10-25 MED ORDER — LIDOCAINE HCL (PF) 1 % IJ SOLN
INTRAMUSCULAR | Status: AC
  Filled 2022-10-25: qty 30

## 2022-10-25 MED ORDER — MIDAZOLAM HCL 5 MG/5ML IJ SOLN
INTRAMUSCULAR | Status: AC
  Filled 2022-10-25: qty 5

## 2022-10-25 MED ORDER — ADULT MULTIVITAMIN W/MINERALS CH
1.0000 | ORAL_TABLET | Freq: Every morning | ORAL | Status: DC
  Administered 2022-10-26: 1 via ORAL
  Filled 2022-10-25: qty 1

## 2022-10-25 MED ORDER — FINASTERIDE 5 MG PO TABS: 5.0000 mg | ORAL_TABLET | Freq: Every day | ORAL | Status: DC

## 2022-10-25 MED ORDER — FENTANYL CITRATE (PF) 100 MCG/2ML IJ SOLN
INTRAMUSCULAR | Status: AC
  Filled 2022-10-25: qty 2

## 2022-10-25 MED ORDER — BUPIVACAINE HCL (PF) 0.25 % IJ SOLN
INTRAMUSCULAR | Status: AC
  Filled 2022-10-25: qty 30

## 2022-10-25 MED ORDER — CARBOXYMETHYLCELLULOSE SOD PF 0.5 % OP SOLN: 1.0000 [drp] | Freq: Every day | OPHTHALMIC | Status: DC | PRN

## 2022-10-25 MED ORDER — BUSPIRONE HCL 5 MG PO TABS
5.0000 mg | ORAL_TABLET | Freq: Two times a day (BID) | ORAL | Status: DC
  Administered 2022-10-25 – 2022-10-26 (×2): 5 mg via ORAL
  Filled 2022-10-25 (×2): qty 1

## 2022-10-25 MED ORDER — ESCITALOPRAM OXALATE 10 MG PO TABS
20.0000 mg | ORAL_TABLET | Freq: Every day | ORAL | Status: DC
  Administered 2022-10-25: 20 mg via ORAL
  Filled 2022-10-25: qty 2

## 2022-10-25 SURGICAL SUPPLY — 9 items
BAG SNAP BAND KOVER 36X36 (MISCELLANEOUS) IMPLANT
CATH EZ STEER NAV 8MM F-J CUR (ABLATOR) IMPLANT
CLOSURE MYNX CONTROL 6F/7F (Vascular Products) IMPLANT
PACK EP LATEX FREE (CUSTOM PROCEDURE TRAY) ×1
PACK EP LF (CUSTOM PROCEDURE TRAY) ×1 IMPLANT
PAD DEFIB RADIO PHYSIO CONN (PAD) ×1 IMPLANT
PATCH CARTO3 (PAD) IMPLANT
SHEATH PINNACLE 8F 10CM (SHEATH) IMPLANT
SHEATH PROBE COVER 6X72 (BAG) IMPLANT

## 2022-10-25 NOTE — Discharge Instructions (Signed)

## 2022-10-25 NOTE — Progress Notes (Signed)
New Admission Note:   Arrival Method: Stretcher  Mental Orientation: Alert and Oriented  Telemetry: wall  Assessment: Completed Skin: intact  IV: Left Ac  Pain: 0/10  Tubes:none  Safety Measures: Safety Fall Prevention Plan has been given, discussed and signed Admission: Completed Orientation: Patient has been orientated to the room, unit and staff.  Family: none   Orders have been reviewed and implemented. Will continue to monitor the patient. Call light has been placed within reach and bed alarm has been activated.   Norman Clay RN [facility]  Phone: [phone]

## 2022-10-25 NOTE — Interval H&P Note (Signed)
History and Physical Interval Note:  10/25/2022 3:17 PM  Ruben Murray  has presented today for surgery, with the diagnosis of av node.  The various methods of treatment have been discussed with the patient and family. After consideration of risks, benefits and other options for treatment, the patient has consented to  Procedure(s): AV NODE ABLATION (N/A) as a surgical intervention.  The patient's history has been reviewed, patient examined, no change in status, stable for surgery.  I have reviewed the patient's chart and labs.  Questions were answered to the patient's satisfaction.     Sylvanna Burggraf [employer]

## 2022-10-26 ENCOUNTER — Encounter (HOSPITAL_COMMUNITY): Payer: Self-pay | Admitting: Cardiology

## 2022-10-26 DIAGNOSIS — Z7901 Long term (current) use of anticoagulants: Secondary | ICD-10-CM | POA: Diagnosis not present

## 2022-10-26 DIAGNOSIS — G4733 Obstructive sleep apnea (adult) (pediatric): Secondary | ICD-10-CM | POA: Diagnosis not present

## 2022-10-26 DIAGNOSIS — I5022 Chronic systolic (congestive) heart failure: Secondary | ICD-10-CM | POA: Diagnosis not present

## 2022-10-26 DIAGNOSIS — G40909 Epilepsy, unspecified, not intractable, without status epilepticus: Secondary | ICD-10-CM | POA: Diagnosis not present

## 2022-10-26 DIAGNOSIS — Z79899 Other long term (current) drug therapy: Secondary | ICD-10-CM | POA: Diagnosis not present

## 2022-10-26 DIAGNOSIS — J449 Chronic obstructive pulmonary disease, unspecified: Secondary | ICD-10-CM | POA: Diagnosis not present

## 2022-10-26 DIAGNOSIS — Z95810 Presence of automatic (implantable) cardiac defibrillator: Secondary | ICD-10-CM | POA: Diagnosis not present

## 2022-10-26 DIAGNOSIS — I272 Pulmonary hypertension, unspecified: Secondary | ICD-10-CM | POA: Diagnosis not present

## 2022-10-26 DIAGNOSIS — I110 Hypertensive heart disease with heart failure: Secondary | ICD-10-CM | POA: Diagnosis not present

## 2022-10-26 DIAGNOSIS — I428 Other cardiomyopathies: Secondary | ICD-10-CM | POA: Diagnosis not present

## 2022-10-26 DIAGNOSIS — I4891 Unspecified atrial fibrillation: Secondary | ICD-10-CM | POA: Diagnosis not present

## 2022-10-26 DIAGNOSIS — I447 Left bundle-branch block, unspecified: Secondary | ICD-10-CM | POA: Diagnosis not present

## 2022-10-26 DIAGNOSIS — I4821 Permanent atrial fibrillation: Secondary | ICD-10-CM | POA: Diagnosis not present

## 2022-10-26 NOTE — Telephone Encounter (Signed)
Per [facility] Prior authorization is not required for CPT 95800. Call to patient who requested his friend [name] be called with instructions and PIN message left for [name] to return call. Pt requested [name]'s husband get call with instructions both persons listed on DPR. Patient's husband requested call back. Jim Like MHA RN CCM

## 2022-10-26 NOTE — TOC Transition Note (Signed)
Transition of Care [facility]) - CM/SW Discharge Note   Patient Details  Name: Ruben Murray MRN: [id number] Date of Birth: 14-Oct-1945  [facility]) CM/SW Contact:  Leone Haven, RN Phone Number: 10/26/2022, 10:21 AM   Clinical Narrative:    For dc today, he has no needs. He has transportation.   Final next level of care: Home/Self Care Barriers to Discharge: No Barriers Identified   Patient Goals and CMS Choice   Choice offered to / list presented to : NA  Discharge Placement                         Discharge Plan and Services Additional resources added to the After Visit Summary for   In-house Referral: NA Discharge Planning Services: CM Consult Post Acute Care Choice: NA          DME Arranged: N/A DME Agency: NA       HH Arranged: NA          Social Determinants of Health (SDOH) Interventions SDOH Screenings   Food Insecurity: Low Risk  (07/21/2022)   [facility]  Housing: Medium Risk (02/04/2022)  Transportation Needs: No Transportation Needs (07/21/2022)   [facility]  Utilities: Low Risk  (07/21/2022)   [facility]  Depression (PHQ2-9): Low Risk  (04/26/2021)  Financial Resource Strain: Medium Risk (10/26/2021)  Physical Activity: Inactive (12/15/2019)  Stress: Stress Concern Present (09/26/2018)  Tobacco Use: Medium Risk (10/23/2022)     Readmission Risk Interventions    08/01/2021   11:26 AM  Readmission Risk Prevention Plan  Transportation Screening Complete  PCP or Specialist Appt within 3-5 Days Complete  HRI or Home Care Consult Complete  Social Work Consult for Recovery Care Planning/Counseling Complete  Palliative Care Screening Complete  Medication Review Oceanographer) Complete

## 2022-10-26 NOTE — TOC Initial Note (Signed)
Transition of Care [facility]) - Initial/Assessment Note    Patient Details  Name: Ruben Murray MRN: [id number] Date of Birth: [birth year]  Transition [facility] Care [facility]) CM/SW Contact:    Leone Haven, RN Phone Number: 10/26/2022, 10:14 AM  Clinical Narrative:                 From home alone,  has PCP and insurance on file, states has a Nurse that comes every Wed, he also has two rollators.  States friend will transport him  home at [employer] and friends  is support system, [facility] [place] [facility] [address]   Pta self ambulatory  with walker.   Expected Discharge Plan: Home/Self Care Barriers to Discharge: No Barriers Identified   Patient Goals and CMS Choice Patient states their goals for this hospitalization and ongoing recovery are:: return home   Choice offered to / list presented to : NA      Expected Discharge Plan and Services In-house Referral: NA Discharge Planning Services: CM Consult Post Acute Care Choice: NA Living arrangements for the past 2 months: Single Family Home Expected Discharge Date: 10/26/22               DME Arranged: N/A DME Agency: NA       HH Arranged: NA          Prior Living Arrangements/Services Living arrangements for the past 2 months: Single Family Home Lives with:: Self Patient language and need for interpreter reviewed:: Yes Do you feel safe going back to the place where you live?: Yes        Care giver support system in place?: No (comment)   Criminal Activity/Legal Involvement Pertinent to Current Situation/Hospitalization: No - Comment as needed  Activities of Daily Living      Permission Sought/Granted Permission sought to share information with : Case Manager Permission granted to share information with : Yes, Verbal Permission Granted              Emotional Assessment Appearance:: Appears stated age Attitude/Demeanor/Rapport: Engaged Affect (typically observed): Appropriate Orientation: :  Oriented to Self, Oriented to Place, Oriented to  Time, Oriented to Situation Alcohol / Substance Use: Not Applicable Psych Involvement: No (comment)  Admission diagnosis:  Permanent atrial fibrillation (HCC) [I48.21] Patient Active Problem List   Diagnosis Date Noted   CHF (congestive heart failure) (HCC) 02/03/2022   Atrial fibrillation (HCC) 11/02/2021   Long term (current) use of anticoagulants 11/02/2021   AKI (acute kidney injury) (HCC) 07/27/2021   Acute pulmonary embolism (HCC) 07/26/2021   Acute on chronic systolic CHF (congestive heart failure) (HCC) 05/17/2021   Hypercoagulable state due to permanent atrial fibrillation (HCC) 05/10/2021   Left leg pain 04/24/2021   OSA on CPAP 04/24/2021   Mitral regurgitation 04/24/2021   Tricuspid regurgitation 04/24/2021   Dilated aortic root (HCC) 04/24/2021   Permanent atrial fibrillation (HCC) 04/19/2021   Chronic arthropathy 04/13/2020   Callus 04/13/2020   Onychomycosis 12/12/2019   Left leg cellulitis 12/12/2019   Chronic systolic heart failure (HCC) 12/09/2019   Dilated cardiomyopathy (HCC)    Atrial flutter (HCC) 11/03/2019   Atrial flutter with rapid ventricular response (HCC) 11/03/2019   Acquired thrombophilia (HCC) 01/15/2019   Atrial fibrillation with rapid ventricular response (HCC) 10/29/2017   Chronic combined systolic and diastolic heart failure (HCC) 10/29/2017   Prolonged QT interval 10/29/2017   Sensorineural hearing loss (SNHL) of both ears 01/31/2017   Dysphonia 10/03/2016   Laryngopharyngeal  reflux (LPR) 10/03/2016   Nasal polyps 10/03/2016   Rhinitis medicamentosa 10/03/2016   Chronic laryngitis 10/03/2016   Hoarseness 08/31/2016   Moderate persistent asthma 08/02/2016   COPD (chronic obstructive pulmonary disease) (HCC) 08/02/2016   Morbid (severe) obesity due to excess calories (HCC) 08/02/2016   Community acquired pneumonia 03/06/2016   Essential hypertension 03/06/2016   Mixed hyperlipidemia  03/06/2016   Right thyroid nodule 03/06/2016   Seizures (HCC) 03/06/2016   Fever 03/12/2015   Recurrent erosion of cornea 06/23/2011   PCP:  Aliene Beams, MD [facility]   [phone] [place] [place] - [address] AT [facility] [address] [place] [address] Phone: [phone] Fax: [phone]     Social Determinants of Health (SDOH) Social History: SDOH Screenings   Food Insecurity: Low Risk  (07/21/2022)   [facility]  Housing: Medium Risk (02/04/2022)  Transportation Needs: No Transportation Needs (07/21/2022)   [facility]  Utilities: Low Risk  (07/21/2022)   [facility]  Depression (PHQ2-9): Low Risk  (04/26/2021)  Financial Resource Strain: Medium Risk (10/26/2021)  Physical Activity: Inactive (12/15/2019)  Stress: Stress Concern Present (09/26/2018)  Tobacco Use: Medium Risk (10/23/2022)   SDOH Interventions:     Readmission Risk Interventions    08/01/2021   11:26 AM  Readmission Risk Prevention Plan  Transportation Screening Complete  PCP or Specialist Appt within 3-5 Days Complete  HRI or Home Care Consult Complete  Social Work Consult for Recovery Care Planning/Counseling Complete  Palliative Care Screening Complete  Medication Review Oceanographer) Complete

## 2022-10-26 NOTE — Discharge Summary (Addendum)
ELECTROPHYSIOLOGY PROCEDURE DISCHARGE SUMMARY    Patient ID: Ruben Murray,  MRN: [id number], DOB/AGE: [birth year] 77 y.o.  Admit date: 10/25/2022 Discharge date: 10/26/2022  Primary Care Physician: Aliene Beams, MD  Primary Cardiologist: Dr. Anne Fu AHF: Dr. Gala Romney Electrophysiologist: Dr. Elberta Fortis  Primary Discharge Diagnosis:  Permanent  Atrial fibrillation with difficult to control       CHA2DS2Vasc is 4, on warfarin  Secondary Discharge Diagnosis:  NICM w/CRT-D P.HTN OSA Seizure d/o HTN COPD  Procedures This Admission:  1.  Electrophysiology study and AV node ablation on by Dr Elberta Fortis, 8//21/24   This study demonstrated  1. Atrial fibrillation/flutter  2. Successful radiofrequency ablation of AV node 3. No early apparent complications.   Brief HPI: Ruben Murray is a 77 y.o. male with a history of permanent atrial fibrillation.  Rates have been difficult to control and recommended AV node ablation, pt was agreeable  Hospital Course:  The patient was admitted and underwent EPS/RFCA of his AV node with details as outlined above.  They were monitored on telemetry overnight which demonstrated V pacing 90.  Groin was without complication on the day of discharge.  The patient feels well today, denies CP, SOB, site discomfort.  He was examined by Dr. Elberta Fortis and considered to be stable for discharge.  Wound care and restrictions were reviewed with the patient.  The patient Ahnika Hannibal be seen back in the office for HR down titrations as usual, follow up is in place   INR yesterday was 3.1 No warfarin yesterday Resume usual dosing schedule tomorrow Has coumadin clinic appt in place for 9//16/24    Physical Exam: Vitals:   10/25/22 2045 10/25/22 2050 10/26/22 0134 10/26/22 0535  BP: 105/72  91/68 109/76  Pulse: 91  90 90  Resp: 16  20 16   Temp: (!) 96.7 F (35.9 C) 97.6 F (36.4 C) (!) 97.5 F (36.4 C) (!) 97.5 F (36.4 C)  TempSrc: Axillary Axillary Oral Oral   SpO2: 96%  92% 99%  Weight:    113.2 kg  Height:        GEN- The patient is well appearing, alert and oriented x 3 today.   HEENT: normocephalic, atraumatic; sclera clear, conjunctiva pink; hearing intact; oropharynx clear; neck supple  Lungs-  CTA b/l, normal work of breathing.  No wheezes, rales, rhonchi Heart- RRR (paced), no murmurs, rubs or gallops  GI- soft, non-tender, non-distended, bowel sounds present  Extremities- no clubbing, cyanosis, or edema; DP/PT/radial pulses 2+ bilaterally, R groin without hematoma/bruit MS- no significant deformity or atrophy Skin- warm and dry, no rash or lesion Psych- euthymic mood, full affect Neuro- strength and sensation are intact   Labs:   Lab Results  Component Value Date   WBC 8.1 10/23/2022   HGB 15.2 10/23/2022   HCT 45.3 10/23/2022   MCV 95 10/23/2022   PLT 178 10/23/2022    Recent Labs  Lab 10/23/22 1022  NA 139  K 4.9  CL 97  CO2 27  BUN 18  CREATININE 1.42*  CALCIUM 9.2  GLUCOSE 113*     Discharge Medications:  Allergies as of 10/26/2022       Reactions   Dilaudid [hydromorphone Hcl] Other (See Comments)   Makes pt hyper    Hydromorphone Other (See Comments)   hyperactiviity Other reaction(s): made him wild Other reaction(s): Unknown   Morphine Sulfate Other (See Comments)   Other reaction(s): at high dose causes confusion   Tegretol [carbamazepine] Hives  Carbamazepine Hives, Rash, Other (See Comments)   Other reaction(s): hives Other reaction(s): Hives Other reaction(s): hives        Medication List     TAKE these medications    acetaminophen 500 MG tablet Commonly known as: TYLENOL Take 1,000 mg by mouth at bedtime.   busPIRone 5 MG tablet Commonly known as: BUSPAR Take 5 mg by mouth 2 (two) times daily.   Carboxymethylcellulose Sod PF 0.5 % Soln Place 1 drop into both eyes daily as needed (dry eyes).   empagliflozin 10 MG Tabs tablet Commonly known as: Jardiance Take 1 tablet (10  mg total) by mouth daily before breakfast.   escitalopram 20 MG tablet Commonly known as: LEXAPRO Take 20 mg by mouth at bedtime.   finasteride 5 MG tablet Commonly known as: PROSCAR Take 1 tablet (5 mg total) by mouth daily at 12 noon.   fluticasone 50 MCG/ACT nasal spray Commonly known as: FLONASE Place 1 spray into both nostrils daily as needed for allergies or rhinitis.   loratadine 10 MG tablet Commonly known as: CLARITIN Take 10 mg by mouth every morning.   losartan 25 MG tablet Commonly known as: COZAAR Take 0.5 tablets (12.5 mg total) by mouth daily.   metoprolol succinate 100 MG 24 hr tablet Commonly known as: TOPROL-XL Take 1 tablet (100 mg total) by mouth 2 (two) times daily. Take with or immediately following a meal.   multivitamin with minerals Tabs tablet Take 1 tablet by mouth every morning.   PHENobarbital 97.2 MG tablet Commonly known as: LUMINAL Take 97.2 mg by mouth every evening. Takes between 3:00-5:00pm.   potassium chloride SA 20 MEQ tablet Commonly known as: KLOR-CON M Take 1 tablet (20 mEq total) by mouth daily.   pravastatin 40 MG tablet Commonly known as: PRAVACHOL Take 40 mg by mouth at bedtime.   spironolactone 25 MG tablet Commonly known as: ALDACTONE Take 0.5 tablets (12.5 mg total) by mouth daily.   torsemide 20 MG tablet Commonly known as: DEMADEX Take 2 tablets (40 mg total) by mouth daily.   triamcinolone cream 0.1 % Commonly known as: KENALOG Apply 1 application  topically 2 (two) times daily as needed (rash).   Vitamin D3 25 MCG (1000 UT) Caps Take 1,000 Units by mouth daily.   warfarin 5 MG tablet Commonly known as: COUMADIN Take as directed. If you are unsure how to take this medication, talk to your nurse or doctor. Original instructions: Take 2.5-5 mg by mouth See admin instructions. Take 2.5 mg on Monday and Friday, all the other days take 5 mg at bedtime Notes to patient: Resume your usual dose/schedule on  10/27/22        Disposition: Home Discharge Instructions     Diet - low sodium heart healthy   Complete by: As directed    Increase activity slowly   Complete by: As directed         Duration of Discharge Encounter: Greater than 30 minutes including physician time.  Norma Fredrickson, PA-C 10/26/2022 9:37 AM   I have seen and examined this patient with Francis Dowse.  Agree with above, note added to reflect my findings.  Patient is post AV node ablation yesterday for permanent atrial fibrillation.  Tolerated the procedure well.  Not having any issues today.  Marjorie Deprey plan for discharge today with follow-up in clinic for device adjustment.  GEN: Well nourished, well developed, in no acute distress  HEENT: normal  Neck: no JVD, carotid bruits, or masses  Cardiac: RRR; no murmurs, rubs, or gallops,no edema  Respiratory:  clear to auscultation bilaterally, normal work of breathing GI: soft, nontender, nondistended, + BS MS: no deformity or atrophy  Skin: warm and dry, device site well healed Neuro:  Strength and sensation are intact Psych: euthymic mood, full affect     Kamora Vossler M. Janis Cuffe MD 10/26/2022 4:56 PM

## 2022-10-26 NOTE — Plan of Care (Signed)
  Problem: Education: Goal: Knowledge of General Education information will improve Description: Including pain rating scale, medication(s)/side effects and non-pharmacologic comfort measures Outcome: Progressing   Problem: Clinical Measurements: Goal: Ability to maintain clinical measurements within normal limits will improve Outcome: Progressing   

## 2022-10-27 ENCOUNTER — Telehealth (HOSPITAL_COMMUNITY): Payer: Self-pay | Admitting: Cardiology

## 2022-10-27 NOTE — Telephone Encounter (Signed)
[facility] Called to request refills-new scripts for amio and warfarin    Amio was discontinued 2/24 and warfarin is managed by pcp  Above given to [facility]_Joann

## 2022-10-30 ENCOUNTER — Telehealth: Payer: Self-pay | Admitting: *Deleted

## 2022-10-30 NOTE — Telephone Encounter (Signed)
Per notes to reach out to friends of the pt"s [place] and Gordy Councilman in regard to the sleep study. I left a message to call back and ask to s/w Okey Regal with Home sleep Studies.

## 2022-10-30 NOTE — Telephone Encounter (Signed)
RETURN CALL: Reached out to patient and confirmed with him that his appointment is 11/15/22 at 8pm at the sleep lab. The sleep lab will call to confirm with the patients before their appointment and they send out the information for the patients with all the information they need including phone numbers and the address. Patient was grateful for the call and thanked me.

## 2022-10-30 NOTE — Telephone Encounter (Signed)
-----   Message from [facility] Apolinar Junes J sent at 10/26/2022 12:13 PM EDT ----- Regarding: RE: Ruben Murray SET UP 08/28/22; AWAITING AUTH Per [facility] Prior authorization is not required for CPT 95800. Call to patient who requested his friend [name] be called with instructions and PIN message left for [name] to return call. Pt requested [name]'s husband get call with instructions both persons listed on DPR. Patient's husband requested call back. Jim Like MHA RN CCM ----- Message ----- From: Tarri Fuller, CMA Sent: 10/24/2022   5:37 PM EDT To: Reesa Chew, CMA; Tarri Fuller, CMA Subject: RE: Ruben Murray SET UP 08/28/22; AWAITING AUTH       Bernestine Amass,   I have not heard anything about if this pt has been approved. This was ordered back 08/28/22. Can you check on this pt please .   Thank you  Okey Regal ----- Message ----- From: Tarri Fuller, CMA Sent: 08/28/2022  10:56 AM EDT To: Tarri Fuller, CMA Subject: Ruben Murray SET UP 08/28/22; AWAITING AUTH           Dr. Mayford Knife ordered an Itamar study. Pt agreeable to signed waiver and not open the box until he has been called with the Pin #.

## 2022-10-30 NOTE — Telephone Encounter (Signed)
I s/w the pt's friend Ruben Murray. Ruben Murray, tells me that the pt was not able to do the Itamar sleep study and that he has been scheduled for a split night sleep study. I apologized as a message was sent to me about the Itamar sleep study.   Ruben Murray does want Coralee North to call her and confirm the split night study as she thought it was this Thursday. I stated that I saw a date of 11/15/22. I assured her that I will have Coralee North call her back an confirm ALL info for the split night study. Ruben Murray stated they did not even know where to go for the study.

## 2022-10-30 NOTE — Telephone Encounter (Signed)
   Ruben Murray returning call, she said, she can answer the phone before 2 pm today she have another doctor's appt

## 2022-11-01 ENCOUNTER — Other Ambulatory Visit (HOSPITAL_COMMUNITY): Payer: Self-pay | Admitting: Family Medicine

## 2022-11-01 ENCOUNTER — Telehealth (HOSPITAL_COMMUNITY): Payer: Self-pay | Admitting: Emergency Medicine

## 2022-11-01 ENCOUNTER — Other Ambulatory Visit (HOSPITAL_COMMUNITY): Payer: Self-pay | Admitting: Emergency Medicine

## 2022-11-01 NOTE — Progress Notes (Signed)
Paramedicine Encounter    Patient ID: Ruben Murray, male    DOB: [birth year], 77 y.o.   MRN: [id number]   Complaints -  none   Assessment - lung sounds clear, no increase in edema. Surgical sites healing well.   Compliance with meds - missed one day due to hospitalization for ablasion procedure.   Pill box filled - for 1x week   Refills needed - phenobarbital, loratadine, multivitamin    Meds changes since last visit - none     Social changes - friedns assisting him are unwell   VISIT SUMMARY** Met with Ruben Murray in the home today. Pt had no complaints. Pt reported that he has had no improvements to his breathing since his abrasion procedure, but was informed in his discharge paperwork that it may take up  to 3x months to note improvements. Pt reported no worsening of his baseline exertional shortness of breath, no chest pain, and no dizziness. Pt did report receiving a call from a pharmacy about his medications and situation handled during visit as noted in telephone note. Pt had recent surgical procedure and advised that he removed all bandages despite discharge instructions. Pt requested evaluation of same and noted no redness, swelling, or discharge. No foul odor noted. Wounds appeared to be healing well. Pt had no further complaints at this time. Pt's pill box was filled for 1x week. Pt was informed upcoming appointments. Will follow up in the home in 1x week.   BP 108/68   Pulse 92   Wt 243 lb 12.8 oz (110.6 kg)   SpO2 94%   BMI 37.07 kg/m  Weight yesterday-245.8 lbs Last visit weight-247 lbs   Benson Setting EMT-P Community Paramedic  [phone]     ACTION: Home visit completed     Patient Care Team: Aliene Beams, MD as PCP - General (Family Medicine) Jake Bathe, MD as PCP - Cardiology  (Cardiology) Regan Lemming, MD as PCP - Electrophysiology (Cardiology) Aliene Beams, MD (Family Medicine) [place] as Case Manager (Hospice and Palliative Medicine)  Patient Active Problem List   Diagnosis Date Noted   CHF (congestive heart failure) (HCC) 02/03/2022   Atrial fibrillation (HCC) 11/02/2021   Long term (current) use of anticoagulants 11/02/2021   AKI (acute kidney injury) (HCC) 07/27/2021   Acute pulmonary embolism (HCC) 07/26/2021   Acute on chronic systolic CHF (congestive heart failure) (HCC) 05/17/2021   Hypercoagulable state due to permanent atrial fibrillation (HCC) 05/10/2021   Left leg pain 04/24/2021   OSA on CPAP 04/24/2021   Mitral regurgitation 04/24/2021   Tricuspid regurgitation 04/24/2021   Dilated aortic root (HCC) 04/24/2021   Permanent atrial fibrillation (HCC) 04/19/2021   Chronic arthropathy 04/13/2020   Callus 04/13/2020   Onychomycosis 12/12/2019   Left leg cellulitis 12/12/2019   Chronic systolic heart failure (HCC) 12/09/2019   Dilated cardiomyopathy (HCC)    Atrial flutter (HCC) 11/03/2019   Atrial flutter with rapid ventricular response (HCC) 11/03/2019   Acquired thrombophilia (HCC) 01/15/2019   Atrial fibrillation with rapid ventricular response (HCC) 10/29/2017   Chronic combined systolic and diastolic heart failure (HCC) 10/29/2017   Prolonged QT interval 10/29/2017   Sensorineural hearing loss (SNHL) of both ears 01/31/2017   Dysphonia 10/03/2016   Laryngopharyngeal reflux (LPR) 10/03/2016   Nasal polyps 10/03/2016   Rhinitis medicamentosa 10/03/2016   Chronic laryngitis 10/03/2016   Hoarseness 08/31/2016   Moderate persistent asthma 08/02/2016   COPD (chronic obstructive pulmonary disease) (HCC) 08/02/2016  Morbid (severe) obesity due to excess calories (HCC) 08/02/2016   Community acquired pneumonia 03/06/2016   Essential hypertension 03/06/2016   Mixed hyperlipidemia 03/06/2016   Right thyroid  nodule 03/06/2016   Seizures (HCC) 03/06/2016   Fever 03/12/2015   Recurrent erosion of cornea 06/23/2011    Current Outpatient Medications:    acetaminophen (TYLENOL) 500 MG tablet, Take 1,000 mg by mouth at bedtime., Disp: , Rfl:    busPIRone (BUSPAR) 5 MG tablet, Take 5 mg by mouth 2 (two) times daily., Disp: , Rfl:    Carboxymethylcellulose Sod PF 0.5 % SOLN, Place 1 drop into both eyes daily as needed (dry eyes)., Disp: , Rfl:    Cholecalciferol (VITAMIN D3) 25 MCG (1000 UT) CAPS, Take 1,000 Units by mouth daily., Disp: , Rfl:    empagliflozin (JARDIANCE) 10 MG TABS tablet, Take 1 tablet (10 mg total) by mouth daily before breakfast., Disp: 90 tablet, Rfl: 3   escitalopram (LEXAPRO) 20 MG tablet, Take 20 mg by mouth at bedtime., Disp: , Rfl:    finasteride (PROSCAR) 5 MG tablet, Take 1 tablet (5 mg total) by mouth daily at 12 noon., Disp: 30 tablet, Rfl: 1   fluticasone (FLONASE) 50 MCG/ACT nasal spray, Place 1 spray into both nostrils daily as needed for allergies or rhinitis., Disp: , Rfl:    losartan (COZAAR) 25 MG tablet, Take 0.5 tablets (12.5 mg total) by mouth daily., Disp: 45 tablet, Rfl: 3   metoprolol succinate (TOPROL-XL) 100 MG 24 hr tablet, Take 1 tablet (100 mg total) by mouth 2 (two) times daily. Take with or immediately following a meal., Disp: 180 tablet, Rfl: 3   Multiple Vitamin (MULTIVITAMIN WITH MINERALS) TABS tablet, Take 1 tablet by mouth every morning., Disp: , Rfl:    PHENobarbital (LUMINAL) 97.2 MG tablet, Take 97.2 mg by mouth every evening. Takes between 3:00-5:00pm., Disp: , Rfl:    potassium chloride SA (KLOR-CON M) 20 MEQ tablet, Take 1 tablet (20 mEq total) by mouth daily., Disp: 90 tablet, Rfl: 3   pravastatin (PRAVACHOL) 40 MG tablet, Take 40 mg by mouth at bedtime. , Disp: , Rfl:    spironolactone (ALDACTONE) 25 MG tablet, Take 0.5 tablets (12.5 mg total) by mouth daily., Disp: 45 tablet, Rfl: 2   torsemide (DEMADEX) 20 MG tablet, Take 2 tablets (40 mg  total) by mouth daily., Disp: 180 tablet, Rfl: 3   triamcinolone cream (KENALOG) 0.1 %, Apply 1 application  topically 2 (two) times daily as needed (rash)., Disp: , Rfl:    warfarin (COUMADIN) 5 MG tablet, Take 2.5-5 mg by mouth See admin instructions. Take 2.5 mg on Monday and Friday, all the other days take 5 mg at bedtime, Disp: , Rfl:    loratadine (CLARITIN) 10 MG tablet, Take 10 mg by mouth every morning. (Patient not taking: Reported on 11/01/2022), Disp: , Rfl:  Allergies  Allergen Reactions   Dilaudid [Hydromorphone Hcl] Other (See Comments)    Makes pt hyper    Hydromorphone Other (See Comments)    hyperactiviity Other reaction(s): made him wild Other reaction(s): Unknown   Morphine Sulfate Other (See Comments)    Other reaction(s): at high dose causes confusion   Tegretol [Carbamazepine] Hives   Carbamazepine Hives, Rash and Other (See Comments)    Other reaction(s): hives Other reaction(s): Hives Other reaction(s): hives     Social History   Socioeconomic History   Marital status: Married    Spouse name: Not on file   Number of children: Not  on file   Years of education: Not on file   Highest education level: Not on file  Occupational History   Not on file  Tobacco Use   Smoking status: Former    Current packs/day: 0.00    Average packs/day: 3.0 packs/day for 48.0 years (144.0 ttl pk-yrs)    Types: Cigarettes    Start date: 67    Quit date: 2000    Years since quitting: 24.6   Smokeless tobacco: Never  Vaping Use   Vaping status: Never Used  Substance and Sexual Activity   Alcohol use: Never   Drug use: Never   Sexual activity: Never  Other Topics Concern   Not on file  Social History Narrative   ** Merged History Encounter **       Social Determinants of Health   Financial Resource Strain: Medium Risk (10/26/2021)   Overall Financial Resource Strain (CARDIA)    Difficulty of Paying Living Expenses: Somewhat hard  Food Insecurity: Low Risk   (07/21/2022)   [facility]   Food vital sign    Within the past 12 months, you worried that your food would run out before you got money to buy more: Never true    Within the past 12 months, the food you bought just didn't last and you didn't have money to get more. : Never true  Transportation Needs: No Transportation Needs (07/21/2022)   [facility]   Transportation    In the past 12 months, has lack of reliable transportation kept you from medical appointments, meetings, work or from getting things needed for daily living? : No  Physical Activity: Inactive (12/15/2019)   Exercise Vital Sign    Days of Exercise per Week: 0 days    Minutes of Exercise per Session: 0 min  Stress: Stress Concern Present (09/26/2018)   [employer] of Occupational Health - Occupational Stress Questionnaire    Feeling of Stress : To some extent  Social Connections: Not on file  Intimate Partner Violence: Not At Risk (02/04/2022)   Humiliation, Afraid, Rape, and Kick questionnaire    Fear of Current or Ex-Partner: No    Emotionally Abused: No    Physically Abused: No    Sexually Abused: No    Physical Exam      Future Appointments  Date Time Provider Department Center  11/15/2022  8:00 PM Quintella Reichert, MD [facility]  11/17/2022  1:55 PM Sheilah Pigeon, PA-C [facility]  11/20/2022 10:00 AM [facility] None  11/22/2022 12:00 PM Graciella Freer, PA-C [facility]  11/24/2022  7:00 AM [facility] DEVICE REMOTES [facility]  11/24/2022  1:15 PM Regal, Kirstie Peri, DPM [facility]  11/24/2022  3:00 PM Jake Bathe, MD [facility]  02/23/2023  7:00 AM [facility] DEVICE REMOTES [facility]  05/25/2023  7:00 AM [facility] DEVICE REMOTES [facility]  08/24/2023  7:00 AM [facility] DEVICE REMOTES [facility]

## 2022-11-01 NOTE — Telephone Encounter (Signed)
Assisted Ruben Murray in contacting [facility] in reference to a recent refill request noted in his chart. Pt was unaware of how he was signed up. I called to find out more information and was informed that insurance signed him up in an effort to get him to deliverable pill packs. Due to his disease processes and constant medication changes, the patient and I determined that this was not the correct route to take at this time.   Account was cancelled.   I will follow up with [facility] to ensure he still has all of his prescriptions active there.   Ruben Murray EMT-P Community Paramedic  [phone]

## 2022-11-03 DIAGNOSIS — I5042 Chronic combined systolic (congestive) and diastolic (congestive) heart failure: Secondary | ICD-10-CM | POA: Diagnosis not present

## 2022-11-03 DIAGNOSIS — M21962 Unspecified acquired deformity of left lower leg: Secondary | ICD-10-CM | POA: Diagnosis not present

## 2022-11-03 DIAGNOSIS — E7800 Pure hypercholesterolemia, unspecified: Secondary | ICD-10-CM | POA: Diagnosis not present

## 2022-11-03 DIAGNOSIS — H6123 Impacted cerumen, bilateral: Secondary | ICD-10-CM | POA: Diagnosis not present

## 2022-11-03 DIAGNOSIS — I480 Paroxysmal atrial fibrillation: Secondary | ICD-10-CM | POA: Diagnosis not present

## 2022-11-03 DIAGNOSIS — F419 Anxiety disorder, unspecified: Secondary | ICD-10-CM | POA: Diagnosis not present

## 2022-11-03 DIAGNOSIS — G40909 Epilepsy, unspecified, not intractable, without status epilepticus: Secondary | ICD-10-CM | POA: Diagnosis not present

## 2022-11-03 DIAGNOSIS — Z0000 Encounter for general adult medical examination without abnormal findings: Secondary | ICD-10-CM | POA: Diagnosis not present

## 2022-11-03 DIAGNOSIS — F331 Major depressive disorder, recurrent, moderate: Secondary | ICD-10-CM | POA: Diagnosis not present

## 2022-11-03 DIAGNOSIS — E1122 Type 2 diabetes mellitus with diabetic chronic kidney disease: Secondary | ICD-10-CM | POA: Diagnosis not present

## 2022-11-03 DIAGNOSIS — I110 Hypertensive heart disease with heart failure: Secondary | ICD-10-CM | POA: Diagnosis not present

## 2022-11-03 DIAGNOSIS — N400 Enlarged prostate without lower urinary tract symptoms: Secondary | ICD-10-CM | POA: Diagnosis not present

## 2022-11-07 ENCOUNTER — Telehealth (HOSPITAL_COMMUNITY): Payer: Self-pay

## 2022-11-07 NOTE — Telephone Encounter (Signed)
Ruben Murray requesting a dermatology referral from Dr. Adela Glimpse- I called her nurse [name] and left a message for her to do so and to let us know the outcome.   Maralyn Sago, EMT-Paramedic [phone] 11/07/2022

## 2022-11-08 ENCOUNTER — Telehealth (HOSPITAL_COMMUNITY): Payer: Self-pay

## 2022-11-08 ENCOUNTER — Other Ambulatory Visit (HOSPITAL_COMMUNITY): Payer: Self-pay | Admitting: Emergency Medicine

## 2022-11-08 NOTE — Telephone Encounter (Signed)
Ruben Murray expressed concerns with transportation this morning to Maralyn Sago Medical laboratory scientific officer) during their home visit- he reports that his friends [name] and Rudell Cobb are having trouble getting him to some of his appointments coming up and he has no other way to get to his appointments at present. I will forward concern to [facility] SW Rosetta Posner for follow up.   Maralyn Sago, EMT-Paramedic [phone] 11/08/2022

## 2022-11-08 NOTE — Progress Notes (Signed)
Paramedicine Encounter    Patient ID: Ruben Murray, male    DOB: [birth year], 77 y.o.   MRN: [id number]   Complaints - none.   Assessment - Lung sounds clear. No edema noted.  Compliance with meds - no missed doses noted.   Pill box filled - for 1x week   Refills needed - busperone, metoprolol, multivitamin, loradatine, and phenobarbital.   Meds changes since last visit - none    Social changes -    VISIT SUMMARY**  Met with Ruben Murray in the home. Ruben Murray advised that he was doing well and feeling well and was without any heart related complaints today. Pt denied chest pain, increased shortness of breath, or dizziness. Pt stated that he was having difficulty seeing and said he needed to see an eye doctor. Pt was referred to his primary care for same. Pt also advised that he had not received his phenobarbitol. Pt advised when he went to pick it up, it was out of stock. I contacted the pharmacy and confirmed that it would be ready for pick up today and told pt same. Pt was aware of how to add it to his pill box. Pt was assessed as noted and no abnormalities found. Pt's pill box was filled for 1x week. Pt was reminded of all upcoming appointments and both calendars updated. Will follow up in the home in 1x week.   BP (!) 132/98   Pulse 90   Resp 16   Wt 246 lb 9.6 oz (111.9 kg)   SpO2 97%   BMI 37.50 kg/m  Weight yesterday-246.8 lbs Last visit weight-243 lbs   Benson Setting EMT-P Community Paramedic  [phone]    ACTION: Home visit completed     Patient Care Team: Aliene Beams, MD as PCP - General (Family Medicine) Jake Bathe, MD as PCP - Cardiology (Cardiology) Regan Lemming, MD as PCP - Electrophysiology (Cardiology) Aliene Beams, MD (Family Medicine) [place] as Case Manager (Hospice and Palliative Medicine)  Patient Active Problem List   Diagnosis Date Noted   CHF (congestive heart failure) (HCC) 02/03/2022   Atrial  fibrillation (HCC) 11/02/2021   Long term (current) use of anticoagulants 11/02/2021   AKI (acute kidney injury) (HCC) 07/27/2021   Acute pulmonary embolism (HCC) 07/26/2021   Acute on chronic systolic CHF (congestive heart failure) (HCC) 05/17/2021   Hypercoagulable state due to permanent atrial fibrillation (HCC) 05/10/2021   Left leg pain 04/24/2021   OSA on CPAP 04/24/2021   Mitral regurgitation 04/24/2021   Tricuspid regurgitation 04/24/2021   Dilated aortic root (HCC) 04/24/2021   Permanent atrial fibrillation (HCC) 04/19/2021   Chronic arthropathy 04/13/2020   Callus 04/13/2020   Onychomycosis 12/12/2019   Left leg cellulitis 12/12/2019   Chronic systolic heart failure (HCC) 12/09/2019   Dilated cardiomyopathy (HCC)    Atrial flutter (HCC) 11/03/2019   Atrial flutter with rapid ventricular response (HCC) 11/03/2019   Acquired thrombophilia (HCC) 01/15/2019   Atrial fibrillation with rapid ventricular response (HCC) 10/29/2017   Chronic combined systolic and diastolic heart failure (HCC) 10/29/2017   Prolonged QT interval 10/29/2017   Sensorineural hearing loss (SNHL) of both ears 01/31/2017   Dysphonia 10/03/2016   Laryngopharyngeal reflux (LPR) 10/03/2016   Nasal polyps 10/03/2016   Rhinitis medicamentosa 10/03/2016   Chronic laryngitis 10/03/2016   Hoarseness 08/31/2016   Moderate persistent asthma 08/02/2016   COPD (chronic obstructive pulmonary disease) (HCC) 08/02/2016   Morbid (severe) obesity due to excess calories (HCC)  08/02/2016   Community acquired pneumonia 03/06/2016   Essential hypertension 03/06/2016   Mixed hyperlipidemia 03/06/2016   Right thyroid nodule 03/06/2016   Seizures (HCC) 03/06/2016   Fever 03/12/2015   Recurrent erosion of cornea 06/23/2011    Current Outpatient Medications:    acetaminophen (TYLENOL) 500 MG tablet, Take 1,000 mg by mouth at bedtime., Disp: , Rfl:    busPIRone (BUSPAR) 5 MG tablet, Take 5 mg by mouth 2 (two) times  daily., Disp: , Rfl:    Carboxymethylcellulose Sod PF 0.5 % SOLN, Place 1 drop into both eyes daily as needed (dry eyes)., Disp: , Rfl:    Cholecalciferol (VITAMIN D3) 25 MCG (1000 UT) CAPS, Take 1,000 Units by mouth daily., Disp: , Rfl:    empagliflozin (JARDIANCE) 10 MG TABS tablet, Take 1 tablet (10 mg total) by mouth daily before breakfast., Disp: 90 tablet, Rfl: 3   escitalopram (LEXAPRO) 20 MG tablet, Take 20 mg by mouth at bedtime., Disp: , Rfl:    finasteride (PROSCAR) 5 MG tablet, Take 1 tablet (5 mg total) by mouth daily at 12 noon., Disp: 30 tablet, Rfl: 1   fluticasone (FLONASE) 50 MCG/ACT nasal spray, Place 1 spray into both nostrils daily as needed for allergies or rhinitis., Disp: , Rfl:    losartan (COZAAR) 25 MG tablet, Take 0.5 tablets (12.5 mg total) by mouth daily., Disp: 45 tablet, Rfl: 3   metoprolol succinate (TOPROL-XL) 100 MG 24 hr tablet, TAKE 1 TABLET BY MOUTH 2 TIMES DAILY. TAKE WITH OR IMMEDIATELY FOLLOWING A MEAL., Disp: 180 tablet, Rfl: 3   potassium chloride SA (KLOR-CON M) 20 MEQ tablet, Take 1 tablet (20 mEq total) by mouth daily., Disp: 90 tablet, Rfl: 3   pravastatin (PRAVACHOL) 40 MG tablet, Take 40 mg by mouth at bedtime. , Disp: , Rfl:    spironolactone (ALDACTONE) 25 MG tablet, Take 0.5 tablets (12.5 mg total) by mouth daily., Disp: 45 tablet, Rfl: 2   torsemide (DEMADEX) 20 MG tablet, Take 2 tablets (40 mg total) by mouth daily., Disp: 180 tablet, Rfl: 3   triamcinolone cream (KENALOG) 0.1 %, Apply 1 application  topically 2 (two) times daily as needed (rash)., Disp: , Rfl:    loratadine (CLARITIN) 10 MG tablet, Take 10 mg by mouth every morning. (Patient not taking: Reported on 11/01/2022), Disp: , Rfl:    Multiple Vitamin (MULTIVITAMIN WITH MINERALS) TABS tablet, Take 1 tablet by mouth every morning., Disp: , Rfl:    PHENobarbital (LUMINAL) 97.2 MG tablet, Take 97.2 mg by mouth every evening. Takes between 3:00-5:00pm., Disp: , Rfl:    warfarin (COUMADIN) 5  MG tablet, Take 2.5-5 mg by mouth See admin instructions. Take 2.5 mg on Monday and Friday, all the other days take 5 mg at bedtime, Disp: , Rfl:  Allergies  Allergen Reactions   Dilaudid [Hydromorphone Hcl] Other (See Comments)    Makes pt hyper    Hydromorphone Other (See Comments)    hyperactiviity Other reaction(s): made him wild Other reaction(s): Unknown   Morphine Sulfate Other (See Comments)    Other reaction(s): at high dose causes confusion   Tegretol [Carbamazepine] Hives   Carbamazepine Hives, Rash and Other (See Comments)    Other reaction(s): hives Other reaction(s): Hives Other reaction(s): hives     Social History   Socioeconomic History   Marital status: Married    Spouse name: Not on file   Number of children: Not on file   Years of education: Not on file  Highest education level: Not on file  Occupational History   Not on file  Tobacco Use   Smoking status: Former    Current packs/day: 0.00    Average packs/day: 3.0 packs/day for 48.0 years (144.0 ttl pk-yrs)    Types: Cigarettes    Start date: 64    Quit date: 2000    Years since quitting: 24.6   Smokeless tobacco: Never  Vaping Use   Vaping status: Never Used  Substance and Sexual Activity   Alcohol use: Never   Drug use: Never   Sexual activity: Never  Other Topics Concern   Not on file  Social History Narrative   ** Merged History Encounter **       Social Determinants of Health   Financial Resource Strain: Medium Risk (10/26/2021)   Overall Financial Resource Strain (CARDIA)    Difficulty of Paying Living Expenses: Somewhat hard  Food Insecurity: Low Risk  (07/21/2022)   [facility]   Food vital sign    Within the past 12 months, you worried that your food would run out before you got money to buy more: Never true    Within the past 12 months, the food you bought just didn't last and you didn't have money to get more. : Never true  Transportation  Needs: No Transportation Needs (07/21/2022)   [facility]   Transportation    In the past 12 months, has lack of reliable transportation kept you from medical appointments, meetings, work or from getting things needed for daily living? : No  Physical Activity: Inactive (12/15/2019)   Exercise Vital Sign    Days of Exercise per Week: 0 days    Minutes of Exercise per Session: 0 min  Stress: Stress Concern Present (09/26/2018)   [employer] of Occupational Health - Occupational Stress Questionnaire    Feeling of Stress : To some extent  Social Connections: Not on file  Intimate Partner Violence: Not At Risk (02/04/2022)   Humiliation, Afraid, Rape, and Kick questionnaire    Fear of Current or Ex-Partner: No    Emotionally Abused: No    Physically Abused: No    Sexually Abused: No    Physical Exam      Future Appointments  Date Time Provider Department Center  11/15/2022  8:00 PM Quintella Reichert, MD [facility]  11/17/2022  1:55 PM Sheilah Pigeon, PA-C [facility]  11/20/2022 10:00 AM [facility] None  11/22/2022 12:00 PM Graciella Freer, PA-C [facility]  11/24/2022  7:00 AM [facility] DEVICE REMOTES [facility]  11/24/2022  1:15 PM Regal, Kirstie Peri, DPM [facility]  11/24/2022  3:00 PM Jake Bathe, MD [facility]  02/23/2023  7:00 AM [facility] DEVICE REMOTES [facility]  05/25/2023  7:00 AM [facility] DEVICE REMOTES [facility]  08/24/2023  7:00 AM [facility] DEVICE REMOTES [facility]

## 2022-11-09 ENCOUNTER — Telehealth (HOSPITAL_COMMUNITY): Payer: Self-pay | Admitting: Emergency Medicine

## 2022-11-09 NOTE — Telephone Encounter (Signed)
Spoke with Mr. Ruben Murray about his cancelled appointment with Dr. Lanna Poche Pt is aware.   Benson Setting EMT-P Community Paramedic  [phone]

## 2022-11-15 ENCOUNTER — Ambulatory Visit (HOSPITAL_BASED_OUTPATIENT_CLINIC_OR_DEPARTMENT_OTHER): Payer: No Typology Code available for payment source | Attending: Cardiology | Admitting: Cardiology

## 2022-11-15 ENCOUNTER — Other Ambulatory Visit (HOSPITAL_COMMUNITY): Payer: Self-pay | Admitting: Emergency Medicine

## 2022-11-15 VITALS — Ht 68.0 in | Wt 247.6 lb

## 2022-11-15 DIAGNOSIS — Z6838 Body mass index (BMI) 38.0-38.9, adult: Secondary | ICD-10-CM | POA: Insufficient documentation

## 2022-11-15 DIAGNOSIS — R5383 Other fatigue: Secondary | ICD-10-CM | POA: Insufficient documentation

## 2022-11-15 DIAGNOSIS — I428 Other cardiomyopathies: Secondary | ICD-10-CM | POA: Diagnosis not present

## 2022-11-15 DIAGNOSIS — R400 Somnolence: Secondary | ICD-10-CM | POA: Insufficient documentation

## 2022-11-15 DIAGNOSIS — G4733 Obstructive sleep apnea (adult) (pediatric): Secondary | ICD-10-CM | POA: Diagnosis not present

## 2022-11-15 DIAGNOSIS — I5020 Unspecified systolic (congestive) heart failure: Secondary | ICD-10-CM | POA: Insufficient documentation

## 2022-11-15 DIAGNOSIS — E669 Obesity, unspecified: Secondary | ICD-10-CM | POA: Diagnosis not present

## 2022-11-15 DIAGNOSIS — G4736 Sleep related hypoventilation in conditions classified elsewhere: Secondary | ICD-10-CM | POA: Insufficient documentation

## 2022-11-15 NOTE — Progress Notes (Signed)
Paramedicine Encounter    Patient ID: Ruben Murray, male    DOB: 10/24/1945, 77 y.o.   MRN: [id number]   Complaints -  NO ACUTE COMPLAINTS, DENIED DIZZINESS OR CHEST PAIN. ADVISED NO CHANGE TO BASELINE SHORTNESS OF BREATH.  Assessment - LUNG SOUNDS CLEAR, NO PEDAL EDEMA   Compliance with meds - NO MISSED DOSES NOTED   Pill box filled FOR 1X WEEK   Refills needed - BUSPIRONE, LORADATINE, MULTIVITAMIN   Meds changes since last visit - NONE    Social changes - NONE   VISIT SUMMARY**  Met in the home with Ruben Murray. Pt denied any acute complaints at this time. Pt advised that he has increase his exercise by walking with his rollator outside. Pt denied any increased shortness of breath, but still reports baseline exertional shortness of breath. Pt's pill box was filled for 1x week, warfarin following current dose, but will follow up next week after anticoag appointment for any dose changes. Pt was informed of upcoming appointment and calendars updated to reflect same. Will follow up with pt in home in 1x week unless any medication changes occur.   BP 110/78   Pulse 90   Resp 16   Wt 247 lb 9.6 oz (112.3 kg)   SpO2 95%   BMI 37.65 kg/m  Weight yesterday-247.6 lbs  Last visit weight-246 lbs   [place] EMT-P Community Paramedic  [phone]    ACTION: Home visit completed     Patient Care Team: Aliene Beams, MD as PCP - General (Family Medicine) Jake Bathe, MD as PCP - Cardiology (Cardiology) Regan Lemming, MD as PCP - Electrophysiology (Cardiology) Aliene Beams, MD (Family Medicine) [place] as Case Manager (Hospice and Palliative Medicine)  Patient Active Problem List   Diagnosis Date Noted   CHF (congestive heart failure) (HCC) 02/03/2022   Atrial fibrillation (HCC) 11/02/2021   Long term (current) use of anticoagulants 11/02/2021   AKI (acute kidney injury) (HCC) 07/27/2021   Acute pulmonary embolism (HCC) 07/26/2021    Acute on chronic systolic CHF (congestive heart failure) (HCC) 05/17/2021   Hypercoagulable state due to permanent atrial fibrillation (HCC) 05/10/2021   Left leg pain 04/24/2021   OSA on CPAP 04/24/2021   Mitral regurgitation 04/24/2021   Tricuspid regurgitation 04/24/2021   Dilated aortic root (HCC) 04/24/2021   Permanent atrial fibrillation (HCC) 04/19/2021   Chronic arthropathy 04/13/2020   Callus 04/13/2020   Onychomycosis 12/12/2019   Left leg cellulitis 12/12/2019   Chronic systolic heart failure (HCC) 12/09/2019   Dilated cardiomyopathy (HCC)    Atrial flutter (HCC) 11/03/2019   Atrial flutter with rapid ventricular response (HCC) 11/03/2019   Acquired thrombophilia (HCC) 01/15/2019   Atrial fibrillation with rapid ventricular response (HCC) 10/29/2017   Chronic combined systolic and diastolic heart failure (HCC) 10/29/2017   Prolonged QT interval 10/29/2017   Sensorineural hearing loss (SNHL) of both ears 01/31/2017   Dysphonia 10/03/2016   Laryngopharyngeal reflux (LPR) 10/03/2016   Nasal polyps 10/03/2016   Rhinitis medicamentosa 10/03/2016   Chronic laryngitis 10/03/2016   Hoarseness 08/31/2016   Moderate persistent asthma 08/02/2016   COPD (chronic obstructive pulmonary disease) (HCC) 08/02/2016   Morbid (severe) obesity due to excess calories (HCC) 08/02/2016   Community acquired pneumonia 03/06/2016   Essential hypertension 03/06/2016   Mixed hyperlipidemia 03/06/2016   Right thyroid nodule 03/06/2016   Seizures (HCC) 03/06/2016   Fever 03/12/2015   Recurrent erosion of cornea 06/23/2011    Current Outpatient Medications:  acetaminophen (TYLENOL) 500 MG tablet, Take 1,000 mg by mouth at bedtime., Disp: , Rfl:    busPIRone (BUSPAR) 5 MG tablet, Take 5 mg by mouth 2 (two) times daily., Disp: , Rfl:    Carboxymethylcellulose Sod PF 0.5 % SOLN, Place 1 drop into both eyes daily as needed (dry eyes)., Disp: , Rfl:    Cholecalciferol (VITAMIN D3) 25 MCG (1000 UT)  CAPS, Take 1,000 Units by mouth daily., Disp: , Rfl:    empagliflozin (JARDIANCE) 10 MG TABS tablet, Take 1 tablet (10 mg total) by mouth daily before breakfast., Disp: 90 tablet, Rfl: 3   escitalopram (LEXAPRO) 20 MG tablet, Take 20 mg by mouth at bedtime., Disp: , Rfl:    finasteride (PROSCAR) 5 MG tablet, Take 1 tablet (5 mg total) by mouth daily at 12 noon., Disp: 30 tablet, Rfl: 1   fluticasone (FLONASE) 50 MCG/ACT nasal spray, Place 1 spray into both nostrils daily as needed for allergies or rhinitis., Disp: , Rfl:    losartan (COZAAR) 25 MG tablet, Take 0.5 tablets (12.5 mg total) by mouth daily., Disp: 45 tablet, Rfl: 3   metoprolol succinate (TOPROL-XL) 100 MG 24 hr tablet, TAKE 1 TABLET BY MOUTH 2 TIMES DAILY. TAKE WITH OR IMMEDIATELY FOLLOWING A MEAL., Disp: 180 tablet, Rfl: 3   PHENobarbital (LUMINAL) 97.2 MG tablet, Take 97.2 mg by mouth every evening. Takes between 3:00-5:00pm., Disp: , Rfl:    potassium chloride SA (KLOR-CON M) 20 MEQ tablet, Take 1 tablet (20 mEq total) by mouth daily., Disp: 90 tablet, Rfl: 3   pravastatin (PRAVACHOL) 40 MG tablet, Take 40 mg by mouth at bedtime. , Disp: , Rfl:    spironolactone (ALDACTONE) 25 MG tablet, Take 0.5 tablets (12.5 mg total) by mouth daily., Disp: 45 tablet, Rfl: 2   torsemide (DEMADEX) 20 MG tablet, Take 2 tablets (40 mg total) by mouth daily., Disp: 180 tablet, Rfl: 3   triamcinolone cream (KENALOG) 0.1 %, Apply 1 application  topically 2 (two) times daily as needed (rash)., Disp: , Rfl:    warfarin (COUMADIN) 5 MG tablet, Take 2.5-5 mg by mouth See admin instructions. Take 2.5 mg on Monday and Friday, all the other days take 5 mg at bedtime, Disp: , Rfl:    loratadine (CLARITIN) 10 MG tablet, Take 10 mg by mouth every morning. (Patient not taking: Reported on 11/01/2022), Disp: , Rfl:    Multiple Vitamin (MULTIVITAMIN WITH MINERALS) TABS tablet, Take 1 tablet by mouth every morning. (Patient not taking: Reported on 11/08/2022), Disp: ,  Rfl:  Allergies  Allergen Reactions   Dilaudid [Hydromorphone Hcl] Other (See Comments)    Makes pt hyper    Hydromorphone Other (See Comments)    hyperactiviity Other reaction(s): made him wild Other reaction(s): Unknown   Morphine Sulfate Other (See Comments)    Other reaction(s): at high dose causes confusion   Tegretol [Carbamazepine] Hives   Carbamazepine Hives, Rash and Other (See Comments)    Other reaction(s): hives Other reaction(s): Hives Other reaction(s): hives     Social History   Socioeconomic History   Marital status: Married    Spouse name: Not on file   Number of children: Not on file   Years of education: Not on file   Highest education level: Not on file  Occupational History   Not on file  Tobacco Use   Smoking status: Former    Current packs/day: 0.00    Average packs/day: 3.0 packs/day for 48.0 years (144.0 ttl pk-yrs)  Types: Cigarettes    Start date: 46    Quit date: 2000    Years since quitting: 24.7   Smokeless tobacco: Never  Vaping Use   Vaping status: Never Used  Substance and Sexual Activity   Alcohol use: Never   Drug use: Never   Sexual activity: Never  Other Topics Concern   Not on file  Social History Narrative   ** Merged History Encounter **       Social Determinants of Health   Financial Resource Strain: Medium Risk (10/26/2021)   Overall Financial Resource Strain (CARDIA)    Difficulty of Paying Living Expenses: Somewhat hard  Food Insecurity: Low Risk  (07/21/2022)   [facility]   Hunger Vital Sign    Worried About Running Out of Food in the Last Year: Never true    Ran Out of Food in the Last Year: Never true  Transportation Needs: No Transportation Needs (07/21/2022)   [facility]   Transportation    In the past 12 months, has lack of reliable transportation kept you from medical appointments, meetings, work or from getting things needed for daily  living? : No  Physical Activity: Inactive (12/15/2019)   Exercise Vital Sign    Days of Exercise per Week: 0 days    Minutes of Exercise per Session: 0 min  Stress: Stress Concern Present (09/26/2018)   [employer] of Occupational Health - Occupational Stress Questionnaire    Feeling of Stress : To some extent  Social Connections: Not on file  Intimate Partner Violence: Not At Risk (02/04/2022)   Humiliation, Afraid, Rape, and Kick questionnaire    Fear of Current or Ex-Partner: No    Emotionally Abused: No    Physically Abused: No    Sexually Abused: No    Physical Exam      Future Appointments  Date Time Provider Department Center  11/15/2022  8:00 PM Quintella Reichert, MD [facility]  11/17/2022  1:55 PM Sheilah Pigeon, PA-C [facility]  11/20/2022 10:00 AM [facility] None  11/24/2022  7:00 AM [facility] DEVICE REMOTES [facility]  11/24/2022  1:15 PM Lenn Sink, DPM [facility]  11/24/2022  3:00 PM Jake Bathe, MD [facility]  02/23/2023  7:00 AM [facility] DEVICE REMOTES [facility]  03/02/2023 12:15 PM Regan Lemming, MD [facility]  05/25/2023  7:00 AM [facility] DEVICE REMOTES [facility]  08/24/2023  7:00 AM [facility] DEVICE REMOTES [facility]

## 2022-11-16 ENCOUNTER — Telehealth (HOSPITAL_COMMUNITY): Payer: Self-pay | Admitting: Licensed Clinical Social Worker

## 2022-11-16 NOTE — Telephone Encounter (Signed)
H&V Care Navigation CSW Progress Note  Clinical Social Worker attempted to call pt to discuss transportation options to get to upcoming appts.  Unable to reach or leave VM.  Asked [facility] in [place] to mail an application to his home.  Paramedic updated  SDOH Screenings   Food Insecurity: Low Risk  (07/21/2022)   [facility]  Housing: Medium Risk (02/04/2022)  Transportation Needs: No Transportation Needs (07/21/2022)   [facility]  Utilities: Low Risk  (07/21/2022)   [facility]  Depression (PHQ2-9): Low Risk  (04/26/2021)  Financial Resource Strain: Medium Risk (10/26/2021)  Physical Activity: Inactive (12/15/2019)  Stress: Stress Concern Present (09/26/2018)  Tobacco Use: Medium Risk (10/23/2022)    Burna Sis, LCSW Clinical Social Worker [facility] Desk#: [phone] Cell#: [phone]

## 2022-11-17 ENCOUNTER — Ambulatory Visit: Payer: No Typology Code available for payment source | Attending: Cardiology | Admitting: Physician Assistant

## 2022-11-17 ENCOUNTER — Encounter: Payer: Self-pay | Admitting: Physician Assistant

## 2022-11-17 VITALS — BP 90/72 | HR 91 | Ht 68.0 in | Wt 249.8 lb

## 2022-11-17 DIAGNOSIS — D6869 Other thrombophilia: Secondary | ICD-10-CM | POA: Diagnosis not present

## 2022-11-17 DIAGNOSIS — Z95810 Presence of automatic (implantable) cardiac defibrillator: Secondary | ICD-10-CM | POA: Diagnosis not present

## 2022-11-17 DIAGNOSIS — I4821 Permanent atrial fibrillation: Secondary | ICD-10-CM

## 2022-11-17 DIAGNOSIS — I5022 Chronic systolic (congestive) heart failure: Secondary | ICD-10-CM | POA: Diagnosis not present

## 2022-11-17 DIAGNOSIS — I420 Dilated cardiomyopathy: Secondary | ICD-10-CM | POA: Diagnosis not present

## 2022-11-17 DIAGNOSIS — I5042 Chronic combined systolic (congestive) and diastolic (congestive) heart failure: Secondary | ICD-10-CM | POA: Diagnosis not present

## 2022-11-17 LAB — CUP PACEART INCLINIC DEVICE CHECK
Battery Remaining Longevity: 70 mo
Brady Statistic RA Percent Paced: 0 %
Brady Statistic RV Percent Paced: 99.55 %
Date Time Interrogation Session: 20240913174957
HighPow Impedance: 73.125
Implantable Lead Connection Status: 753985
Implantable Lead Connection Status: 753985
Implantable Lead Connection Status: 753985
Implantable Lead Implant Date: 20231201
Implantable Lead Implant Date: 20231201
Implantable Lead Implant Date: 20231201
Implantable Lead Location: 753858
Implantable Lead Location: 753859
Implantable Lead Location: 753860
Implantable Pulse Generator Implant Date: 20231201
Lead Channel Impedance Value: 437.5 Ohm
Lead Channel Impedance Value: 450 Ohm
Lead Channel Impedance Value: 862.5 Ohm
Lead Channel Pacing Threshold Amplitude: 0.75 V
Lead Channel Pacing Threshold Amplitude: 1.75 V
Lead Channel Pacing Threshold Pulse Width: 0.4 ms
Lead Channel Pacing Threshold Pulse Width: 1 ms
Lead Channel Sensing Intrinsic Amplitude: 11.7 mV
Lead Channel Setting Pacing Amplitude: 1.75 V
Lead Channel Setting Pacing Amplitude: 2.25 V
Lead Channel Setting Pacing Pulse Width: 0.4 ms
Lead Channel Setting Pacing Pulse Width: 1 ms
Lead Channel Setting Sensing Sensitivity: 0.5 mV
Pulse Gen Serial Number: 211010361
Zone Setting Status: 755011

## 2022-11-17 NOTE — Progress Notes (Signed)
Cardiology Office Note Date:  11/17/2022  Patient ID:  Ruben Murray, Ruben Murray 1946-02-14, MRN [id number] PCP:  Aliene Beams, MD  Cardiologist:  Dr. Anne Fu Electrophysiologist: Dr. Elberta Fortis    Chief Complaint: Post AV node ablation  History of Present Illness: Ruben Murray is a 77 y.o. male with history of obesity, HTN, OSA, seizure d/o, permanent AFib, DCM (?tachy mediated), chronic CHF (systolic), PE,   Para medicine and neighbors helps him, care, procedures difficult with dementia, bed bugs , difficult transportation  Social services and para med helping him, transportation a barrier for him  Last saw HF team 06/21/22, volume stable   TODAY He is doing well No rest SOB, sleeps in a hospital bed or recliner for years  DOE is unchanged for years, can get tot he bathroom and back, will need to rest No CP, palpitations No near syncope or syncope Denies any orthostatic symptoms No bleeding or signs of bleeding Reports medication compliance with para medicine help    Device information Abbott CRT-D implanted 02/03/22 AV node ablation 10/25/22  Arrhythmia/AAD hx Diagnosed jan 2020 in the environment of PNA Initial DCCV failed and started on amiodarone 2020 Off amiodarone > permament AF AV node ablation 10/25/22  Past Medical History:  Diagnosis Date   Acquired thrombophilia (HCC) 01/15/2019   Aortic atherosclerosis (HCC)    Aortic root dilatation (HCC)    CAP (community acquired pneumonia) 03/06/2016   Chronic laryngitis 10/03/2016   Chronic systolic CHF (congestive heart failure) (HCC)    COPD GOLD II  08/02/2016   Quit smoking 2000  PFT's  07/10/2016  FEV1 1.98 (70 % ) ratio 67  p 19 % improvement from saba p nothing  prior to study while of coreg so rec as of 08/02/2016 try off coreg and on bisoprolol      Essential hypertension 03/06/2016   Changed from coreg to bisoprolol due to copd with reversible component  08/02/2016 >>>    Hoarseness 08/31/2016   Referred to  ent 08/31/2016 >>> seen 10/03/16 Jenne Pane dx gerd and rhinitis medicamentosa   > improved on f/u 01/31/17 on gerd rx/ flonase and off afrin   Hyperlipidemia    Hypertension    Laryngopharyngeal reflux (LPR) 10/03/2016   Mitral regurgitation    Moderate persistent asthma 08/02/2016   Morbid (severe) obesity due to excess calories (HCC) 08/02/2016   Nasal polyps 10/03/2016   NICM (nonischemic cardiomyopathy) (HCC)    a. EF 40-45% in 04/2016, etiology not defined, managed medically.   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   Rhinitis medicamentosa 10/03/2016   Right thyroid nodule 03/06/2016   Seizures (HCC)    "take RX daily" (03/06/2016)   Sensorineural hearing loss (SNHL) of both ears 01/31/2017   Tricuspid regurgitation     Past Surgical History:  Procedure Laterality Date   AV NODE ABLATION N/A 10/25/2022   Procedure: AV NODE ABLATION;  Surgeon: Regan Lemming, MD;  Location: [facility] INVASIVE CV LAB;  Service: Cardiovascular;  Laterality: N/A;   BIV ICD INSERTION CRT-D N/A 02/03/2022   Procedure: BIV ICD INSERTION CRT-D;  Surgeon: Regan Lemming, MD;  Location: [facility] INVASIVE CV LAB;  Service: Cardiovascular;  Laterality: N/A;   CARDIOVERSION N/A 12/13/2017   Procedure: CARDIOVERSION;  Surgeon: Chilton Si, MD;  Location: [facility] ENDOSCOPY;  Service: Cardiovascular;  Laterality: N/A;   CARDIOVERSION N/A 09/25/2018  Procedure: CARDIOVERSION;  Surgeon: Chrystie Nose, MD;  Location: [facility] ENDOSCOPY;  Service: Cardiovascular;  Laterality: N/A;   CARDIOVERSION N/A 11/04/2018   Procedure: CARDIOVERSION;  Surgeon: Vesta Mixer, MD;  Location[facility] ENDOSCOPY;  Service: Cardiovascular;  Laterality: N/A;   CARDIOVERSION N/A 04/11/2019   Procedure: CARDIOVERSION;  Surgeon: Lewayne Bunting, MD;  Location: [facility] ENDOSCOPY;  Service: Cardiovascular;  Laterality: N/A;   CIRCUMCISION     JOINT  REPLACEMENT     PERCUTANEOUS PINNING TOE FRACTURE Left    "big toe   REPLACEMENT TOTAL KNEE Left    RIGHT/LEFT HEART CATH AND CORONARY ANGIOGRAPHY N/A 11/06/2019   Procedure: RIGHT/LEFT HEART CATH AND CORONARY ANGIOGRAPHY;  Surgeon: Lennette Bihari, MD;  Location: [facility] INVASIVE CV LAB;  Service: Cardiovascular;  Laterality: N/A;   TONSILLECTOMY AND ADENOIDECTOMY      Current Outpatient Medications  Medication Sig Dispense Refill   acetaminophen (TYLENOL) 500 MG tablet Take 1,000 mg by mouth at bedtime.     busPIRone (BUSPAR) 5 MG tablet Take 5 mg by mouth 2 (two) times daily.     Carboxymethylcellulose Sod PF 0.5 % SOLN Place 1 drop into both eyes daily as needed (dry eyes).     Cholecalciferol (VITAMIN D3) 25 MCG (1000 UT) CAPS Take 1,000 Units by mouth daily.     empagliflozin (JARDIANCE) 10 MG TABS tablet Take 1 tablet (10 mg total) by mouth daily before breakfast. 90 tablet 3   escitalopram (LEXAPRO) 20 MG tablet Take 20 mg by mouth at bedtime.     finasteride (PROSCAR) 5 MG tablet Take 1 tablet (5 mg total) by mouth daily at 12 noon. 30 tablet 1   fluticasone (FLONASE) 50 MCG/ACT nasal spray Place 1 spray into both nostrils daily as needed for allergies or rhinitis.     loratadine (CLARITIN) 10 MG tablet Take 10 mg by mouth every morning. (Patient not taking: Reported on 11/01/2022)     losartan (COZAAR) 25 MG tablet Take 0.5 tablets (12.5 mg total) by mouth daily. 45 tablet 3   metoprolol succinate (TOPROL-XL) 100 MG 24 hr tablet TAKE 1 TABLET BY MOUTH 2 TIMES DAILY. TAKE WITH OR IMMEDIATELY FOLLOWING A MEAL. 180 tablet 3   Multiple Vitamin (MULTIVITAMIN WITH MINERALS) TABS tablet Take 1 tablet by mouth every morning. (Patient not taking: Reported on 11/08/2022)     PHENobarbital (LUMINAL) 97.2 MG tablet Take 97.2 mg by mouth every evening. Takes between 3:00-5:00pm.     potassium chloride SA (KLOR-CON M) 20 MEQ tablet Take 1 tablet (20 mEq total) by mouth daily. 90 tablet 3   pravastatin  (PRAVACHOL) 40 MG tablet Take 40 mg by mouth at bedtime.      spironolactone (ALDACTONE) 25 MG tablet Take 0.5 tablets (12.5 mg total) by mouth daily. 45 tablet 2   torsemide (DEMADEX) 20 MG tablet Take 2 tablets (40 mg total) by mouth daily. 180 tablet 3   triamcinolone cream (KENALOG) 0.1 % Apply 1 application  topically 2 (two) times daily as needed (rash).     warfarin (COUMADIN) 5 MG tablet Take 2.5-5 mg by mouth See admin instructions. Take 2.5 mg on Monday and Friday, all the other days take 5 mg at bedtime     No current facility-administered medications for this visit.    Allergies:   Dilaudid [hydromorphone hcl], Hydromorphone, Morphine sulfate, Tegretol [carbamazepine], and Carbamazepine   Social History:  The patient  reports that he quit smoking about 24 years ago. His smoking use included  cigarettes. He started smoking about 72 years ago. He has a 144 pack-year smoking history. He has never used smokeless tobacco. He reports that he does not drink alcohol and does not use drugs.   Family History:  The patient's family history includes Hypertension in his mother; Other in his father.  ROS:  Please see the history of present illness.    All other systems are reviewed and otherwise negative.   PHYSICAL EXAM:  VS:  There were no vitals taken for this visit. BMI: There is no height or weight on file to calculate BMI. Well nourished, well developed, in no acute distress HEENT: normocephalic, atraumatic Neck: no JVD, carotid bruits or masses Cardiac: RRR (paced); no significant murmurs, no rubs, or gallops Lungs:  CTA b/l, no wheezing, rhonchi or rales Abd: soft, nontender MS: no deformity or atrophy Ext:  no edema Skin: warm and dry, no rash Neuro:  No gross deficits appreciated Psych: euthymic mood, full affect   EKG:  not done today  Device interrogation done today and reviewed by myself Battery and lead measurements are good VVI programming Rate reduced to 80 No R  waves at 50 No HVR/VT episodes   07/27/21: TTE  1. Left ventricular ejection fraction, by estimation, is 20%. The left  ventricle has severely decreased function. The left ventricle demonstrates  global hypokinesis. The left ventricular internal cavity size was mildly  dilated. Left ventricular  diastolic parameters are indeterminate.   2. Right ventricular systolic function is moderately reduced. The right  ventricular size is severely enlarged. There is normal pulmonary artery  systolic pressure. The estimated right ventricular systolic pressure is  34.9 mmHg.   3. Left atrial size was mildly dilated.   4. Right atrial size was moderately dilated.   5. The mitral valve is normal in structure. Mild mitral valve  regurgitation. No evidence of mitral stenosis.   6. Tricuspid valve regurgitation is moderate to severe.   7. The aortic valve is tricuspid. There is mild calcification of the  aortic valve. Aortic valve regurgitation is mild. Aortic valve sclerosis  is present, with no evidence of aortic valve stenosis.   8. Aortic dilatation noted. There is mild dilatation of the aortic root,  measuring 37 mm.   9. The inferior vena cava is dilated in size with <50% respiratory  variability, suggesting right atrial pressure of 15 mmHg.  10. The patient was in atrial fibrillation.   11/06/19: LHC Findings are consistent with a dilated nonischemic cardiomyopathy. Normal coronary arteries with a codominant system. Mild right heart pressure elevation with mild pulmonary hypertension; PA 34/22, mean PA pressure 28 mm. Atrial flutter with variable block and rapid ventricular response.   RECOMMENDATION:  Guideline directed medical therapy for nonischemic cardiomyopathy.  In this patient with recent Covid 19 positivity consider post Covid myocarditis.   Recent Labs: 08/22/2022: ALT 30; Magnesium 2.5 09/04/2022: B Natriuretic Peptide 250.9 10/23/2022: BUN 18; Creatinine, Ser 1.42; Hemoglobin 15.2;  Platelets 178; Potassium 4.9; Sodium 139  No results found for requested labs within last 365 days.   CrCl cannot be calculated (Patient's most recent lab result is older than the maximum 21 days allowed.).   Wt Readings from Last 3 Encounters:  11/15/22 247 lb 9.6 oz (112.3 kg)  11/15/22 247 lb 9.6 oz (112.3 kg)  11/08/22 246 lb 9.6 oz (111.9 kg)     Other studies reviewed: Additional studies/records reviewed today include: summarized above  ASSESSMENT AND PLAN:  CRT-D intact function rate reduced  to 80 today  Permanent AFib On warfarin S/p AVNode ablation    3.   DCM 4.   Chronic CHF No symptoms of exam findings of volume OL today CorVue wobbles about threshold a little under today >99 BP% Weight is down 5lbs from last in out system On: empaglifozin, losartan, metoprolol, spiro, torsemide, K+ c/w Dr. Lynne Logan Due to see them in Oct    5.   Secondary hypercoagulable state AFib    Disposition: back in about 3 weeks (given his transportation difficulties will try to avoid a big burden on visits) for further reduction in HR with the device clinic, and Dr. Elberta Fortis as scheduled in Dec, sooner if needed.  Current medicines are reviewed at length with the patient today.  The patient did not have any concerns regarding medicines.  Norma Fredrickson, PA-C 11/17/2022 5:38 AM     [facility] [address] [place] [address] [phone] (office)  [phone] (fax)

## 2022-11-17 NOTE — Patient Instructions (Signed)
Medication Instructions:   Your physician recommends that you continue on your current medications as directed. Please refer to the Current Medication list given to you today.  *If you need a refill on your cardiac medications before your next appointment, please call your pharmacy*   Lab Work: NONE ORDERED  TODAY    If you have labs (blood work) drawn today and your tests are completely normal, you will receive your results only by: MyChart Message (if you have MyChart) OR A paper copy in the mail If you have any lab test that is abnormal or we need to change your treatment, we will call you to review the results.   Testing/Procedures: NONE ORDERED  TODAY    Follow-Up: At [facility], you and your health needs are our priority.  As part of our continuing mission to provide you with exceptional heart care, we have created designated Provider Care Teams.  These Care Teams include your primary Cardiologist (physician) and Advanced Practice Providers (APPs -  Physician Assistants and Nurse Practitioners) who all work together to provide you with the care you need, when you need it.  We recommend signing up for the patient portal called "MyChart".  Sign up information is provided on this After Visit Summary.  MyChart is used to connect with patients for Virtual Visits (Telemedicine).  Patients are able to view lab/test results, encounter notes, upcoming appointments, etc.  Non-urgent messages can be sent to your provider as well.   To learn more about what you can do with MyChart, go to [email or web address].    Your next appointment:  DEVICE CLINIC 3 week(s)  ( CONTACT CASSIE HALL  FOR EP SCHEDULING ISSUES )   Provider:  DR Kittie Plater IN OCTOBER    {Other Instructions

## 2022-11-17 NOTE — Telephone Encounter (Signed)
Pt was seen in the office today by Francis Dowse, PAC. Her covering CMA Edson Snowball brought the sleep device that was ordered, though pt was unable to do the home sleep study. Pt returned the device today.   I will un register the device and place back into stock.

## 2022-11-20 ENCOUNTER — Other Ambulatory Visit (HOSPITAL_COMMUNITY): Payer: Self-pay | Admitting: Emergency Medicine

## 2022-11-20 ENCOUNTER — Other Ambulatory Visit: Payer: Self-pay | Admitting: Cardiology

## 2022-11-20 ENCOUNTER — Ambulatory Visit: Payer: No Typology Code available for payment source | Attending: Cardiology | Admitting: *Deleted

## 2022-11-20 ENCOUNTER — Telehealth (HOSPITAL_COMMUNITY): Payer: Self-pay | Admitting: Emergency Medicine

## 2022-11-20 DIAGNOSIS — I2699 Other pulmonary embolism without acute cor pulmonale: Secondary | ICD-10-CM | POA: Diagnosis not present

## 2022-11-20 DIAGNOSIS — I4891 Unspecified atrial fibrillation: Secondary | ICD-10-CM | POA: Diagnosis not present

## 2022-11-20 DIAGNOSIS — Z7901 Long term (current) use of anticoagulants: Secondary | ICD-10-CM | POA: Diagnosis not present

## 2022-11-20 LAB — PROTIME-INR
INR: 6.9 (ref 0.9–1.2)
Prothrombin Time: 67.3 s — ABNORMAL HIGH (ref 9.1–12.0)

## 2022-11-20 LAB — POCT INR: INR: 7.2 — AB (ref 2.0–3.0)

## 2022-11-20 NOTE — Patient Instructions (Addendum)
Description   @1232  STAT LAB reported as 6.9; Spoke with Ruben Murray (on DPR) and advised not to take any warfarin today, no warfarin tomorrow, no warfarin Wednesday then resume warfarin 1 tablet daily except 1/2 tablet on Mondays and Fridays. Report to ER with any bleeding, falls, accidents, incidents. Recheck INR in 1 week. Coumadin Clinic [phone] or [phone]

## 2022-11-20 NOTE — Telephone Encounter (Signed)
Informed Mr. Ruben Murray of his INR results and advised I would be down for a med rec that afternoon and to not taken his evening meds until I arrived. Pt was agreeable.   Will follow up in the home for a med rec this afternoon.   Benson Setting EMT-P Community Paramedic  [phone]

## 2022-11-20 NOTE — Progress Notes (Signed)
Came to reconcile Ruben Murray medication. Pt had an anti-coag and was advised to not take warfarin today, tomorrow or Wednesday. Pill box updated to reflect orders. Will follow up on Wednesday for normal home visit.   Benson Setting EMT-P Community Paramedic  [phone]

## 2022-11-21 NOTE — Procedures (Signed)
Patient Name: Ruben Murray, Carelock Date: 11/15/2022 Gender: Male D.O.B: 12-10-1945 Age (years): 64 Referring Provider: Armanda Magic MD, ABSM Height (inches): 68 Interpreting Physician: Armanda Magic MD, ABSM Weight (lbs): 247 RPSGT: Shelah Lewandowsky BMI: 38 MRN: [id number] Neck Size: 16.75  CLINICAL INFORMATION Sleep Study Type: NPSG  Indication for sleep study: Congestive Heart Failure, Excessive Daytime Sleepiness, Fatigue, Obesity, OSA, Re-Evaluation  Epworth Sleepiness Score: 17  SLEEP STUDY TECHNIQUE As per the AASM Manual for the Scoring of Sleep and Associated Events v2.3 (April 2016) with a hypopnea requiring 4% desaturations.  The channels recorded and monitored were frontal, central and occipital EEG, electrooculogram (EOG), submentalis EMG (chin), nasal and oral airflow, thoracic and abdominal wall motion, anterior tibialis EMG, snore microphone, electrocardiogram, and pulse oximetry.  MEDICATIONS Medications self-administered by patient taken the night of the study : N/A  SLEEP ARCHITECTURE The study was initiated at 10:00:14 PM and ended at 5:20:25 AM.  Sleep onset time was 0.1 minutes and the sleep efficiency was 62.7%. The total sleep time was 276 minutes.  Stage REM latency was 160.5 minutes.  The patient spent 28.8% of the night in stage N1 sleep, 69.2% in stage N2 sleep, 0.0% in stage N3 and 2% in REM.  Alpha intrusion was absent.  Supine sleep was 74.09%.  RESPIRATORY PARAMETERS The overall apnea/hypopnea index (AHI) was 8.9 per hour. There were 21 total apneas, including 5 obstructive, 16 central and 0 mixed apneas. There were 20 hypopneas and 17 RERAs.  The AHI during Stage REM sleep was 10.9 per hour.  AHI while supine was 7.3 per hour.  The mean oxygen saturation was 90.8%. The minimum SpO2 during sleep was 83.0%.  snoring was noted during this study.  CARDIAC DATA The 2 lead EKG demonstrated pacemaker generated. The mean heart rate was  89.4 beats per minute. Other EKG findings include: None.  LEG MOVEMENT DATA The total PLMS were 0 with a resulting PLMS index of 0.0. Associated arousal with leg movement index was 0.0 .  IMPRESSIONS - Mild obstructive sleep apnea occurred during this study (AHI = 8.9/h). - No significant central sleep apnea occurred during this study (CAI = 3.5/h). - Mild oxygen desaturation was noted during this study (Min O2 = 83.0%). - No snoring was audible during this study. - No cardiac abnormalities were noted during this study. - Clinically significant periodic limb movements did not occur during sleep. No significant associated arousals.  DIAGNOSIS - Obstructive Sleep Apnea (G47.33) - Nocturnal Hypoxemia (G47.36)  RECOMMENDATIONS - Therapeutic CPAP titration to determine optimal pressure required to alleviate sleep disordered breathing. - Avoid alcohol, sedatives and other CNS depressants that may worsen sleep apnea and disrupt normal sleep architecture. - Sleep hygiene should be reviewed to assess factors that may improve sleep quality. - Weight management and regular exercise should be initiated or continued if appropriate.  [Electronically signed] 11/21/2022 05:37 PM  Armanda Magic MD, ABSM Diplomate, American Board of Sleep Medicine

## 2022-11-22 ENCOUNTER — Encounter: Payer: No Typology Code available for payment source | Admitting: Student

## 2022-11-22 ENCOUNTER — Other Ambulatory Visit (HOSPITAL_COMMUNITY): Payer: Self-pay | Admitting: Emergency Medicine

## 2022-11-22 NOTE — Progress Notes (Signed)
Paramedicine Encounter    Patient ID: Ruben Murray, male    DOB: October 30, 1945, 77 y.o.   MRN: [id number]   Complaints - no complaints  Assessment - ambulating around without difficulty, no edema, clear lung sounds  Compliance with meds - no missed doses    Pill box filled - for 1x week  Refills needed - none  Meds changes since last visit - warfarin dose changed, chart followed to reflect the changes.    Social changes - none   VISIT SUMMARY**  Met with Ruben Murray in his home today. Pt had no complaints today, advised that he had been getting along well. Pt denied increased shortness of breath, dizziness, or chest pain. Pt's pill box was filled for 1x week. Upcoming appointments were reviewed and calendars updated. Will follow up in 1x week.   BP 108/72   Pulse 80   Resp 16   Wt 245 lb 12.8 oz (111.5 kg)   SpO2 94%   BMI 37.37 kg/m  Weight yesterday-244.8 lbs  Last visit weight-247 lbs  Benson Setting EMT-P Community Paramedic  [phone]     ACTION: Home visit completed     Patient Care Team: Aliene Beams, MD as PCP - General (Family Medicine) Jake Bathe, MD as PCP - Cardiology (Cardiology) Regan Lemming, MD as PCP - Electrophysiology (Cardiology) Aliene Beams, MD (Family Medicine) [facility] as Case Manager (Hospice and Palliative Medicine)  Patient Active Problem List   Diagnosis Date Noted   Heart failure with reduced ejection fraction (HCC) 11/15/2022   CHF (congestive heart failure) (HCC) 02/03/2022   Atrial fibrillation (HCC) 11/02/2021   Long term (current) use of anticoagulants 11/02/2021   AKI (acute kidney injury) (HCC) 07/27/2021   Acute pulmonary embolism (HCC) 07/26/2021   Acute on chronic systolic CHF (congestive heart failure) (HCC) 05/17/2021   Hypercoagulable state due to permanent atrial fibrillation (HCC) 05/10/2021   Left leg pain 04/24/2021   OSA on CPAP 04/24/2021   Mitral regurgitation 04/24/2021    Tricuspid regurgitation 04/24/2021   Dilated aortic root (HCC) 04/24/2021   Permanent atrial fibrillation (HCC) 04/19/2021   Chronic arthropathy 04/13/2020   Callus 04/13/2020   Onychomycosis 12/12/2019   Left leg cellulitis 12/12/2019   Chronic systolic heart failure (HCC) 12/09/2019   Dilated cardiomyopathy (HCC)    Atrial flutter (HCC) 11/03/2019   Atrial flutter with rapid ventricular response (HCC) 11/03/2019   Acquired thrombophilia (HCC) 01/15/2019   Atrial fibrillation with rapid ventricular response (HCC) 10/29/2017   Chronic combined systolic and diastolic heart failure (HCC) 10/29/2017   Prolonged QT interval 10/29/2017   Sensorineural hearing loss (SNHL) of both ears 01/31/2017   Dysphonia 10/03/2016   Laryngopharyngeal reflux (LPR) 10/03/2016   Nasal polyps 10/03/2016   Rhinitis medicamentosa 10/03/2016   Chronic laryngitis 10/03/2016   Hoarseness 08/31/2016   Moderate persistent asthma 08/02/2016   COPD (chronic obstructive pulmonary disease) (HCC) 08/02/2016   Morbid (severe) obesity due to excess calories (HCC) 08/02/2016   Community acquired pneumonia 03/06/2016   Essential hypertension 03/06/2016   Mixed hyperlipidemia 03/06/2016   Right thyroid nodule 03/06/2016   Seizures (HCC) 03/06/2016   Fever 03/12/2015   Recurrent erosion of cornea 06/23/2011    Current Outpatient Medications:    acetaminophen (TYLENOL) 500 MG tablet, Take 1,000 mg by mouth at bedtime., Disp: , Rfl:    busPIRone (BUSPAR) 5 MG tablet, Take 5 mg by mouth 2 (two) times daily., Disp: , Rfl:    Carboxymethylcellulose Sod  PF 0.5 % SOLN, Place 1 drop into both eyes daily as needed (dry eyes)., Disp: , Rfl:    Cholecalciferol (VITAMIN D3) 25 MCG (1000 UT) CAPS, Take 1,000 Units by mouth daily., Disp: , Rfl:    empagliflozin (JARDIANCE) 10 MG TABS tablet, Take 1 tablet (10 mg total) by mouth daily before breakfast., Disp: 90 tablet, Rfl: 3   escitalopram (LEXAPRO) 20 MG tablet, Take 20 mg by  mouth at bedtime., Disp: , Rfl:    finasteride (PROSCAR) 5 MG tablet, Take 1 tablet (5 mg total) by mouth daily at 12 noon., Disp: 30 tablet, Rfl: 1   fluticasone (FLONASE) 50 MCG/ACT nasal spray, Place 1 spray into both nostrils daily as needed for allergies or rhinitis., Disp: , Rfl:    loratadine (CLARITIN) 10 MG tablet, Take 10 mg by mouth every morning., Disp: , Rfl:    losartan (COZAAR) 25 MG tablet, Take 0.5 tablets (12.5 mg total) by mouth daily., Disp: 45 tablet, Rfl: 3   metoprolol succinate (TOPROL-XL) 100 MG 24 hr tablet, TAKE 1 TABLET BY MOUTH 2 TIMES DAILY. TAKE WITH OR IMMEDIATELY FOLLOWING A MEAL., Disp: 180 tablet, Rfl: 3   Multiple Vitamin (MULTIVITAMIN WITH MINERALS) TABS tablet, Take 1 tablet by mouth every morning., Disp: , Rfl:    PHENobarbital (LUMINAL) 97.2 MG tablet, Take 97.2 mg by mouth every evening. Takes between 3:00-5:00pm., Disp: , Rfl:    potassium chloride SA (KLOR-CON M) 20 MEQ tablet, Take 1 tablet (20 mEq total) by mouth daily., Disp: 90 tablet, Rfl: 3   pravastatin (PRAVACHOL) 40 MG tablet, Take 40 mg by mouth at bedtime. , Disp: , Rfl:    spironolactone (ALDACTONE) 25 MG tablet, Take 0.5 tablets (12.5 mg total) by mouth daily., Disp: 45 tablet, Rfl: 2   torsemide (DEMADEX) 20 MG tablet, Take 2 tablets (40 mg total) by mouth daily., Disp: 180 tablet, Rfl: 3   triamcinolone cream (KENALOG) 0.1 %, Apply 1 application  topically 2 (two) times daily as needed (rash)., Disp: , Rfl:    warfarin (COUMADIN) 5 MG tablet, Take 2.5-5 mg by mouth See admin instructions. Take 2.5 mg on Monday and Friday, all the other days take 5 mg at bedtime, Disp: , Rfl:  Allergies  Allergen Reactions   Dilaudid [Hydromorphone Hcl] Other (See Comments)    Makes pt hyper    Hydromorphone Other (See Comments)    hyperactiviity Other reaction(s): made him wild Other reaction(s): Unknown   Morphine Sulfate Other (See Comments)    Other reaction(s): at high dose causes confusion    Tegretol [Carbamazepine] Hives   Carbamazepine Hives, Rash and Other (See Comments)    Other reaction(s): hives Other reaction(s): Hives Other reaction(s): hives     Social History   Socioeconomic History   Marital status: Married    Spouse name: Not on file   Number of children: Not on file   Years of education: Not on file   Highest education level: Not on file  Occupational History   Not on file  Tobacco Use   Smoking status: Former    Current packs/day: 0.00    Average packs/day: 3.0 packs/day for 48.0 years (144.0 ttl pk-yrs)    Types: Cigarettes    Start date: 73    Quit date: 2000    Years since quitting: 24.7   Smokeless tobacco: Never  Vaping Use   Vaping status: Never Used  Substance and Sexual Activity   Alcohol use: Never   Drug use: Never  Sexual activity: Never  Other Topics Concern   Not on file  Social History Narrative   ** Merged History Encounter **       Social Determinants of Health   Financial Resource Strain: Medium Risk (10/26/2021)   Overall Financial Resource Strain (CARDIA)    Difficulty of Paying Living Expenses: Somewhat hard  Food Insecurity: Low Risk  (07/21/2022)   [facility]   Hunger Vital Sign    Worried About Running Out of Food in the Last Year: Never true    Ran Out of Food in the Last Year: Never true  Transportation Needs: Unmet Transportation Needs (11/16/2022)   PRAPARE - Administrator, Civil Service (Medical): Yes    Lack of Transportation (Non-Medical): No  Physical Activity: Inactive (12/15/2019)   Exercise Vital Sign    Days of Exercise per Week: 0 days    Minutes of Exercise per Session: 0 min  Stress: Stress Concern Present (09/26/2018)   [employer] of Occupational Health - Occupational Stress Questionnaire    Feeling of Stress : To some extent  Social Connections: Not on file  Intimate Partner Violence: Not At Risk (02/04/2022)   Humiliation, Afraid, Rape, and  Kick questionnaire    Fear of Current or Ex-Partner: No    Emotionally Abused: No    Physically Abused: No    Sexually Abused: No    Physical Exam      Future Appointments  Date Time Provider Department Center  11/24/2022  7:00 AM [facility] DEVICE [facility]OTES [facility]  11/24/2022  1:15 PM Lenn Sink, DPM [facility]  11/24/2022  3:00 PM Jake Bathe, MD [facility]  11/28/2022  9:30 AM [facility] None  12/06/2022  2:00 PM [facility] DEVICE 1 [facility]  02/23/2023  7:00 AM [facility] DEVICE REMOTES [facility]  03/02/2023 12:15 PM Regan Lemming, MD [facility]  05/25/2023  7:00 AM [facility] DEVICE REMOTES [facility]  08/24/2023  7:00 AM [facility]ICE [facility]

## 2022-11-24 ENCOUNTER — Ambulatory Visit: Payer: No Typology Code available for payment source | Attending: Cardiology | Admitting: Cardiology

## 2022-11-24 ENCOUNTER — Ambulatory Visit (INDEPENDENT_AMBULATORY_CARE_PROVIDER_SITE_OTHER): Payer: No Typology Code available for payment source

## 2022-11-24 ENCOUNTER — Encounter: Payer: Self-pay | Admitting: Cardiology

## 2022-11-24 ENCOUNTER — Ambulatory Visit (INDEPENDENT_AMBULATORY_CARE_PROVIDER_SITE_OTHER): Payer: No Typology Code available for payment source | Admitting: Podiatry

## 2022-11-24 ENCOUNTER — Encounter: Payer: Self-pay | Admitting: Podiatry

## 2022-11-24 VITALS — BP 118/78 | HR 80 | Ht 68.0 in | Wt 247.0 lb

## 2022-11-24 DIAGNOSIS — M79674 Pain in right toe(s): Secondary | ICD-10-CM | POA: Diagnosis not present

## 2022-11-24 DIAGNOSIS — I420 Dilated cardiomyopathy: Secondary | ICD-10-CM | POA: Diagnosis not present

## 2022-11-24 DIAGNOSIS — M79675 Pain in left toe(s): Secondary | ICD-10-CM | POA: Diagnosis not present

## 2022-11-24 DIAGNOSIS — B351 Tinea unguium: Secondary | ICD-10-CM | POA: Diagnosis not present

## 2022-11-24 DIAGNOSIS — I4821 Permanent atrial fibrillation: Secondary | ICD-10-CM

## 2022-11-24 DIAGNOSIS — D6869 Other thrombophilia: Secondary | ICD-10-CM | POA: Diagnosis not present

## 2022-11-24 LAB — CUP PACEART REMOTE DEVICE CHECK
Battery Remaining Longevity: 76 mo
Battery Remaining Percentage: 84 %
Battery Voltage: 2.98 V
Date Time Interrogation Session: 20240920020008
HighPow Impedance: 77 Ohm
Implantable Lead Connection Status: 753985
Implantable Lead Connection Status: 753985
Implantable Lead Connection Status: 753985
Implantable Lead Implant Date: 20231201
Implantable Lead Implant Date: 20231201
Implantable Lead Implant Date: 20231201
Implantable Lead Location: 753858
Implantable Lead Location: 753859
Implantable Lead Location: 753860
Implantable Pulse Generator Implant Date: 20231201
Lead Channel Impedance Value: 440 Ohm
Lead Channel Impedance Value: 450 Ohm
Lead Channel Impedance Value: 840 Ohm
Lead Channel Pacing Threshold Amplitude: 0.875 V
Lead Channel Pacing Threshold Amplitude: 2.375 V
Lead Channel Pacing Threshold Pulse Width: 0.4 ms
Lead Channel Pacing Threshold Pulse Width: 1 ms
Lead Channel Sensing Intrinsic Amplitude: 0.8 mV
Lead Channel Sensing Intrinsic Amplitude: 11.6 mV
Lead Channel Setting Pacing Amplitude: 1.875
Lead Channel Setting Pacing Amplitude: 2.875
Lead Channel Setting Pacing Pulse Width: 0.4 ms
Lead Channel Setting Pacing Pulse Width: 1 ms
Lead Channel Setting Sensing Sensitivity: 0.5 mV
Pulse Gen Serial Number: 211010361
Zone Setting Status: 755011

## 2022-11-24 NOTE — Patient Instructions (Signed)
Medication Instructions:  The current medical regimen is effective;  continue present plan and medications.  *If you need a refill on your cardiac medications before your next appointment, please call your pharmacy*  Follow-Up: At [facility], you and your health needs are our priority.  As part of our continuing mission to provide you with exceptional heart care, we have created designated Provider Care Teams.  These Care Teams include your primary Cardiologist (physician) and Advanced Practice Providers (APPs -  Physician Assistants and Nurse Practitioners) who all work together to provide you with the care you need, when you need it.  We recommend signing up for the patient portal called "MyChart".  Sign up information is provided on this After Visit Summary.  MyChart is used to connect with patients for Virtual Visits (Telemedicine).  Patients are able to view lab/test results, encounter notes, upcoming appointments, etc.  Non-urgent messages can be sent to your provider as well.   To learn more about what you can do with MyChart, go to [email or web address].    Your next appointment:   1 year(s)  Provider:   Mark Skains, MD      

## 2022-11-24 NOTE — Progress Notes (Signed)
Cardiology Office Note:  .   Date:  11/24/2022  ID:  Ruben Murray, DOB [birth year], MRN [id number] PCP: Aliene Beams, MD  [facility] Providers Cardiologist:  Donato Schultz, MD Electrophysiologist:  Will Jorja Loa, MD    History of Present Illness: .   Ruben Murray is a 77 y.o. male  Discussed with the use of AI scribe software  History of Present Illness   Ruben Murray, a patient with a history of atrial fibrillation and heart failure, presents with shortness of breath, which he attributes to physical exertion. He reports that his symptoms were more pronounced this morning than usual.  He thought this may have been because he was rushing to get ready.  He is currently on multiple medications, including Coumadin, Jardiance, metoprolol, losartan, spironolactone, torsemide, and pravastatin. His Coumadin levels were recently found to be high, leading to an adjustment in his medication regimen. He also reports frequent urination, which he attributes to his torsemide medication. He is being monitored by a team of healthcare professionals, including paramedics who visit his home, heart failure team, EP team.       ROS: No syncope. Uses a walker. Here with [name].   Studies Reviewed: .        11/06/19: LHC Findings are consistent with a dilated nonischemic cardiomyopathy. 07/27/21: TTE  1. Left ventricular ejection fraction, by estimation, is 20%  LABS INR: 7.0 (11/17/2022)  DIAGNOSTIC AV node ablation: Pacemaker dependent  Risk Assessment/Calculations:        STOP-Bang Score:  7      Physical Exam:   VS:  BP 118/78   Pulse 80   Ht 5\' 8"  (1.727 m)   Wt 247 lb (112 kg)   SpO2 95%   BMI 37.56 kg/m    Wt Readings from Last 3 Encounters:  11/24/22 247 lb (112 kg)  11/22/22 245 lb 12.8 oz (111.5 kg)  11/17/22 249 lb 12.8 oz (113.3 kg)    GEN: Well nourished, well developed in no acute distress NECK: No JVD; No carotid bruits CARDIAC: RRR, no murmurs, rubs,  gallops RESPIRATORY:  Clear to auscultation without rales, wheezing or rhonchi  ABDOMEN: Soft, non-tender, non-distended EXTREMITIES:  No edema; No deformity   ASSESSMENT AND PLAN: .    Assessment and Plan    Chronic Heart Failure Shortness of breath likely due to exertion. Stable on current regimen including Metoprolol, Losartan, Spironolactone, Torsemide, Pravastatin, and Jardiance. -Continue current medications.  Atrial Fibrillation Permanent atrial fibrillation managed with Coumadin. Recent INR was high (over 7), leading to temporary cessation and dose adjustment. -Continue adjusted Coumadin regimen.  Pacemaker Dependency Status post AV node ablation, patient is pacemaker dependent. Recent defibrillator check was satisfactory. -No changes to current management.  Frequent Urination Frequent urination due to Torsemide. -Continue Torsemide as the benefits of fluid management outweigh the inconvenience of frequent urination.  Follow-up Given the patient's complex cardiac history and multiple care providers, no changes to the current plan. -Annual follow-up or as needed.            Signed, Donato Schultz, MD

## 2022-11-27 ENCOUNTER — Telehealth (HOSPITAL_COMMUNITY): Payer: Self-pay | Admitting: Emergency Medicine

## 2022-11-27 ENCOUNTER — Encounter: Payer: Self-pay | Admitting: Podiatry

## 2022-11-27 NOTE — Telephone Encounter (Signed)
Ruben Murray called inquiring where his appointment was for tomorrow.   Pt was reminded that he had an [facility] at 9:30 on 11/28/22.   Pt understood and advised that he would be there.  Will follow up in the home on Wednesday.   Benson Setting EMT-P Community Paramedic  [phone]

## 2022-11-27 NOTE — Progress Notes (Signed)
Subjective:   Patient ID: Ruben Murray, male   DOB: 77 y.o.   MRN: [id number]   HPI Patient presents elongated nailbeds 1-5 both feet thick painful that he cannot take care of   ROS      Objective:  Physical Exam  Neurovascular status intact thick yellow brittle nailbeds 1-5 both feet painful     Assessment:  Chronic mycotic nail infection with pain 1-5 both feet     Plan:  Debridement of nailbeds 1-5 both feet no iatrogenic bleeding reappoint routine care

## 2022-11-28 ENCOUNTER — Telehealth (HOSPITAL_COMMUNITY): Payer: Self-pay | Admitting: Emergency Medicine

## 2022-11-28 ENCOUNTER — Ambulatory Visit: Payer: No Typology Code available for payment source | Attending: Cardiovascular Disease | Admitting: *Deleted

## 2022-11-28 DIAGNOSIS — I4891 Unspecified atrial fibrillation: Secondary | ICD-10-CM

## 2022-11-28 DIAGNOSIS — Z7901 Long term (current) use of anticoagulants: Secondary | ICD-10-CM

## 2022-11-28 DIAGNOSIS — I2699 Other pulmonary embolism without acute cor pulmonale: Secondary | ICD-10-CM | POA: Diagnosis not present

## 2022-11-28 LAB — POCT INR: INR: 3.1 — AB (ref 2.0–3.0)

## 2022-11-28 NOTE — Patient Instructions (Signed)
Description   Today take 1/2 tablet of warfarin then START taking warfarin 1 tablet daily except 1/2 tablet on Mondays, Wednesdays, and Fridays. Report to ER with any bleeding, falls, accidents, incidents. Recheck INR in 1 week. [facility] [phone] or [phone]

## 2022-11-28 NOTE — Telephone Encounter (Signed)
Spoke with Mr. Ruben Murray to inform him of his INR results. Pt was talked through half-ing his coumadin pill and placing it with his evening medications. This was done by the direction of the INR calendar chart based off of labs.   Will follow up in the home tomorrow.   Benson Setting EMT-P Community Paramedic  [phone]

## 2022-11-29 ENCOUNTER — Emergency Department (HOSPITAL_COMMUNITY)
Admission: EM | Admit: 2022-11-29 | Discharge: 2022-11-29 | Disposition: A | Discharge status: Home / Self Care | Payer: No Typology Code available for payment source | Attending: Emergency Medicine | Admitting: Emergency Medicine

## 2022-11-29 ENCOUNTER — Telehealth (HOSPITAL_COMMUNITY): Payer: Self-pay | Admitting: Emergency Medicine

## 2022-11-29 ENCOUNTER — Telehealth: Payer: Self-pay | Admitting: Cardiology

## 2022-11-29 ENCOUNTER — Other Ambulatory Visit (HOSPITAL_COMMUNITY): Payer: Self-pay | Admitting: Emergency Medicine

## 2022-11-29 ENCOUNTER — Encounter (HOSPITAL_COMMUNITY): Payer: Self-pay

## 2022-11-29 ENCOUNTER — Emergency Department (HOSPITAL_COMMUNITY): Payer: No Typology Code available for payment source

## 2022-11-29 ENCOUNTER — Other Ambulatory Visit: Payer: Self-pay

## 2022-11-29 DIAGNOSIS — R197 Diarrhea, unspecified: Secondary | ICD-10-CM | POA: Diagnosis not present

## 2022-11-29 DIAGNOSIS — Z7984 Long term (current) use of oral hypoglycemic drugs: Secondary | ICD-10-CM | POA: Diagnosis not present

## 2022-11-29 DIAGNOSIS — Z7901 Long term (current) use of anticoagulants: Secondary | ICD-10-CM | POA: Insufficient documentation

## 2022-11-29 DIAGNOSIS — I509 Heart failure, unspecified: Secondary | ICD-10-CM | POA: Diagnosis not present

## 2022-11-29 DIAGNOSIS — R5383 Other fatigue: Secondary | ICD-10-CM | POA: Insufficient documentation

## 2022-11-29 DIAGNOSIS — R109 Unspecified abdominal pain: Secondary | ICD-10-CM | POA: Diagnosis not present

## 2022-11-29 DIAGNOSIS — I110 Hypertensive heart disease with heart failure: Secondary | ICD-10-CM | POA: Insufficient documentation

## 2022-11-29 DIAGNOSIS — I10 Essential (primary) hypertension: Secondary | ICD-10-CM | POA: Diagnosis not present

## 2022-11-29 DIAGNOSIS — Z950 Presence of cardiac pacemaker: Secondary | ICD-10-CM | POA: Diagnosis not present

## 2022-11-29 DIAGNOSIS — J449 Chronic obstructive pulmonary disease, unspecified: Secondary | ICD-10-CM | POA: Diagnosis not present

## 2022-11-29 DIAGNOSIS — R531 Weakness: Secondary | ICD-10-CM | POA: Diagnosis not present

## 2022-11-29 DIAGNOSIS — Z79899 Other long term (current) drug therapy: Secondary | ICD-10-CM | POA: Diagnosis not present

## 2022-11-29 DIAGNOSIS — I517 Cardiomegaly: Secondary | ICD-10-CM | POA: Diagnosis not present

## 2022-11-29 LAB — CBC WITH DIFFERENTIAL/PLATELET
Abs Immature Granulocytes: 0.01 10*3/uL (ref 0.00–0.07)
Basophils Absolute: 0 10*3/uL (ref 0.0–0.1)
Basophils Relative: 1 %
Eosinophils Absolute: 0.2 10*3/uL (ref 0.0–0.5)
Eosinophils Relative: 3 %
HCT: 46.9 % (ref 39.0–52.0)
Hemoglobin: 15.1 g/dL (ref 13.0–17.0)
Immature Granulocytes: 0 %
Lymphocytes Relative: 32 %
Lymphs Abs: 2 10*3/uL (ref 0.7–4.0)
MCH: 31.9 pg (ref 26.0–34.0)
MCHC: 32.2 g/dL (ref 30.0–36.0)
MCV: 98.9 fL (ref 80.0–100.0)
Monocytes Absolute: 0.6 10*3/uL (ref 0.1–1.0)
Monocytes Relative: 9 %
Neutro Abs: 3.6 10*3/uL (ref 1.7–7.7)
Neutrophils Relative %: 55 %
Platelets: 156 10*3/uL (ref 150–400)
RBC: 4.74 MIL/uL (ref 4.22–5.81)
RDW: 13.5 % (ref 11.5–15.5)
WBC: 6.4 10*3/uL (ref 4.0–10.5)
nRBC: 0 % (ref 0.0–0.2)

## 2022-11-29 LAB — PROTIME-INR
INR: 2.2 — ABNORMAL HIGH (ref 0.8–1.2)
Prothrombin Time: 25 seconds — ABNORMAL HIGH (ref 11.4–15.2)

## 2022-11-29 LAB — COMPREHENSIVE METABOLIC PANEL
ALT: 18 U/L (ref 0–44)
AST: 25 U/L (ref 15–41)
Albumin: 3.8 g/dL (ref 3.5–5.0)
Alkaline Phosphatase: 94 U/L (ref 38–126)
Anion gap: 8 (ref 5–15)
BUN: 20 mg/dL (ref 8–23)
CO2: 34 mmol/L — ABNORMAL HIGH (ref 22–32)
Calcium: 8.9 mg/dL (ref 8.9–10.3)
Chloride: 99 mmol/L (ref 98–111)
Creatinine, Ser: 1.16 mg/dL (ref 0.61–1.24)
GFR, Estimated: >60 mL/min (ref >60)
Glucose, Bld: 95 mg/dL (ref 70–99)
Potassium: 4.4 mmol/L (ref 3.5–5.1)
Sodium: 141 mmol/L (ref 135–145)
Total Bilirubin: 0.6 mg/dL (ref 0.3–1.2)
Total Protein: 7.1 g/dL (ref 6.5–8.1)

## 2022-11-29 LAB — TROPONIN I (HIGH SENSITIVITY)
Troponin I (High Sensitivity): 4 ng/L (ref <18)
Troponin I (High Sensitivity): 5 ng/L (ref <18)

## 2022-11-29 LAB — POC OCCULT BLOOD, ED: Fecal Occult Bld: NEGATIVE

## 2022-11-29 LAB — BRAIN NATRIURETIC PEPTIDE: B Natriuretic Peptide: 291.2 pg/mL — ABNORMAL HIGH (ref 0.0–100.0)

## 2022-11-29 MED ORDER — IOHEXOL 350 MG/ML SOLN
75.0000 mL | Freq: Once | INTRAVENOUS | Status: AC | PRN
  Administered 2022-11-29: 75 mL via INTRAVENOUS

## 2022-11-29 NOTE — Telephone Encounter (Signed)
I spoke with the Paramedic with the outreach program and she says the pt has been shaky, confused, and poor balance since Sunday. She advised them to got to the ED but they were only contemplating it... I tried to call the pt at home but the phone rang several times and no answer.   Will keep in triage and try again later today.

## 2022-11-29 NOTE — Discharge Instructions (Addendum)
Please read and follow all provided instructions.  Your diagnoses today include:  1. Diarrhea, unspecified type     Tests performed today include: Blood cell counts and platelets: Hemoglobin was normal, no evidence of significant blood loss Kidney and liver function tests Pancreas function test (called lipase) Urine test to look for infection Stool test for blood was negative Warfarin level was in the appropriate range at 2.2 CT scan of the abdomen pelvis did not show any concerning findings Vital signs. See below for your results today.   Medications prescribed:  None ordered Take any prescribed medications only as directed.  Home care instructions:  Follow any educational materials contained in this packet.  Follow-up instructions: Please follow-up with your primary care provider in the next 3 days for further evaluation of your symptoms.    Return instructions:  SEEK IMMEDIATE MEDICAL ATTENTION IF: The pain does not go away or becomes severe  A temperature above 101F develops  Repeated vomiting occurs (multiple episodes)  The pain becomes localized to portions of the abdomen. The right side could possibly be appendicitis. In an adult, the left lower portion of the abdomen could be colitis or diverticulitis.  Blood is being passed in stools or vomit (bright red or black tarry stools)  You develop chest pain, difficulty breathing, dizziness or fainting, or become confused, poorly responsive, or inconsolable (young children) If you have any other emergent concerns regarding your health  Additional Information: Abdominal (belly) pain can be caused by many things. Your caregiver performed an examination and possibly ordered blood/urine tests and imaging (CT scan, x-rays, ultrasound). Many cases can be observed and treated at home after initial evaluation in the emergency department. Even though you are being discharged home, abdominal pain can be unpredictable. Therefore, you need  a repeated exam if your pain does not resolve, returns, or worsens. Most patients with abdominal pain don't have to be admitted to the hospital or have surgery, but serious problems like appendicitis and gallbladder attacks can start out as nonspecific pain. Many abdominal conditions cannot be diagnosed in one visit, so follow-up evaluations are very important.  Your vital signs today were: BP 111/75   Pulse 80   Temp 98.2 F (36.8 C) (Oral)   Resp 18   Ht 5\' 8"  (1.727 m)   Wt 108.9 kg   SpO2 100%   BMI 36.49 kg/m  If your blood pressure (bp) was elevated above 135/85 this visit, please have this repeated by your doctor within one month. --------------

## 2022-11-29 NOTE — Progress Notes (Unsigned)
Paramedicine Encounter    Patient ID: Ruben Murray, male    DOB: 20-Jul-1945, 77 y.o.   MRN: [id number]   Complaints - loose bowels (2-3 days), dark (almost black) stools, increased exertional shortness of breath.   Assessment - lung sounds clear, no edema noted    Compliance with meds - missed 2  Pill box filled - for 1x week.   Refills needed none   Meds changes since last visit -  Warfarin dose reflects new chart based off of recent INR findings   Social changes - none   VISIT SUMMARY**  Met with Ruben Murray in the home today. Ruben Murray had no immediate complaints today. Ruben Murray's pill box was filled for 1x week, no refills necessary. While filling pill box, Ruben Murray advised that he has been having a 3 day hx of dark, loose stools reported to be almost black. When inquiring further, Ruben Murray reported an increased exertional shortness of breath. I contacted [facility] and PCP regarding same, but was unable to get a phone call back while in the home. Ruben Murray continued on with his complaints advising that he has been wobbly and woozie recently. I advised Ruben Murray to be transported for further evaluation and Ruben Murray agreed. [facility] was contacted for a non-emergency transfer to [facility]. After transferring care, Ruben Murray wanted me to update his caregiver. When I did, I was informed that yesterday when walking out to the car for his INR appointment, Ruben Murray became extremely short of breath and "White as a sheet". Ruben Murray also reported that Ruben Murray had told him that Ruben Murray was confused on Sunday, did not know where he was, had some dizziness, and increased shortness of breath, non of which was reported before now.  Will follow up with Ruben Murray upon discharge.   BP 116/74   Pulse 80   Resp 16   Wt 243 lb (110.2 kg)   SpO2 94%   BMI 36.95 kg/m  Weight yesterday-247.6 lbs  Last visit weight-245 lbs   Benson Setting EMT-P Community Paramedic  [phone]     ACTION: Home visit completed     Patient Care  Team: Aliene Beams, MD as PCP - General (Family Medicine) Jake Bathe, MD as PCP - Cardiology (Cardiology) Regan Lemming, MD as PCP - Electrophysiology (Cardiology) Aliene Beams, MD (Family Medicine) [facility] as Case Manager (Hospice and Palliative Medicine)  Patient Active Problem List   Diagnosis Date Noted   Heart failure with reduced ejection fraction (HCC) 11/15/2022   CHF (congestive heart failure) (HCC) 02/03/2022   Atrial fibrillation (HCC) 11/02/2021   Long term (current) use of anticoagulants 11/02/2021   AKI (acute kidney injury) (HCC) 07/27/2021   Acute pulmonary embolism (HCC) 07/26/2021   Acute on chronic systolic CHF (congestive heart failure) (HCC) 05/17/2021   Hypercoagulable state due to permanent atrial fibrillation (HCC) 05/10/2021   Left leg pain 04/24/2021   OSA on CPAP 04/24/2021   Mitral regurgitation 04/24/2021   Tricuspid regurgitation 04/24/2021   Dilated aortic root (HCC) 04/24/2021   Permanent atrial fibrillation (HCC) 04/19/2021   Chronic arthropathy 04/13/2020   Callus 04/13/2020   Onychomycosis 12/12/2019   Left leg cellulitis 12/12/2019   Chronic systolic heart failure (HCC) 12/09/2019   Dilated cardiomyopathy (HCC)    Atrial flutter (HCC) 11/03/2019   Atrial flutter with rapid ventricular response (HCC) 11/03/2019   Acquired thrombophilia (HCC) 01/15/2019   Atrial fibrillation with rapid ventricular response (HCC) 10/29/2017   Chronic combined systolic and diastolic  heart failure (HCC) 10/29/2017   Prolonged QT interval 10/29/2017   Sensorineural hearing loss (SNHL) of both ears 01/31/2017   Dysphonia 10/03/2016   Laryngopharyngeal reflux (LPR) 10/03/2016   Nasal polyps 10/03/2016   Rhinitis medicamentosa 10/03/2016   Chronic laryngitis 10/03/2016   Hoarseness 08/31/2016   Moderate persistent asthma 08/02/2016   COPD (chronic obstructive pulmonary disease) (HCC) 08/02/2016   Morbid (severe) obesity due to  excess calories (HCC) 08/02/2016   Community acquired pneumonia 03/06/2016   Essential hypertension 03/06/2016   Mixed hyperlipidemia 03/06/2016   Right thyroid nodule 03/06/2016   Seizures (HCC) 03/06/2016   Fever 03/12/2015   Recurrent erosion of cornea 06/23/2011    Current Outpatient Medications:    acetaminophen (TYLENOL) 500 MG tablet, Take 1,000 mg by mouth at bedtime., Disp: , Rfl:    busPIRone (BUSPAR) 5 MG tablet, Take 5 mg by mouth 2 (two) times daily., Disp: , Rfl:    Carboxymethylcellulose Sod PF 0.5 % SOLN, Place 1 drop into both eyes daily as needed (dry eyes)., Disp: , Rfl:    Cholecalciferol (VITAMIN D3) 25 MCG (1000 UT) CAPS, Take 1,000 Units by mouth daily., Disp: , Rfl:    empagliflozin (JARDIANCE) 10 MG TABS tablet, Take 1 tablet (10 mg total) by mouth daily before breakfast., Disp: 90 tablet, Rfl: 3   escitalopram (LEXAPRO) 20 MG tablet, Take 20 mg by mouth at bedtime., Disp: , Rfl:    finasteride (PROSCAR) 5 MG tablet, Take 1 tablet (5 mg total) by mouth daily at 12 noon., Disp: 30 tablet, Rfl: 1   fluticasone (FLONASE) 50 MCG/ACT nasal spray, Place 1 spray into both nostrils daily as needed for allergies or rhinitis., Disp: , Rfl:    loratadine (CLARITIN) 10 MG tablet, Take 10 mg by mouth every morning., Disp: , Rfl:    losartan (COZAAR) 25 MG tablet, Take 0.5 tablets (12.5 mg total) by mouth daily., Disp: 45 tablet, Rfl: 3   metoprolol succinate (TOPROL-XL) 100 MG 24 hr tablet, TAKE 1 TABLET BY MOUTH 2 TIMES DAILY. TAKE WITH OR IMMEDIATELY FOLLOWING A MEAL., Disp: 180 tablet, Rfl: 3   Multiple Vitamin (MULTIVITAMIN WITH MINERALS) TABS tablet, Take 1 tablet by mouth every morning., Disp: , Rfl:    PHENobarbital (LUMINAL) 97.2 MG tablet, Take 97.2 mg by mouth every evening. Takes between 3:00-5:00pm., Disp: , Rfl:    potassium chloride SA (KLOR-CON M) 20 MEQ tablet, Take 1 tablet (20 mEq total) by mouth daily., Disp: 90 tablet, Rfl: 3   pravastatin (PRAVACHOL) 40 MG  tablet, Take 40 mg by mouth at bedtime. , Disp: , Rfl:    spironolactone (ALDACTONE) 25 MG tablet, Take 0.5 tablets (12.5 mg total) by mouth daily., Disp: 45 tablet, Rfl: 2   torsemide (DEMADEX) 20 MG tablet, Take 2 tablets (40 mg total) by mouth daily., Disp: 180 tablet, Rfl: 3   triamcinolone cream (KENALOG) 0.1 %, Apply 1 application  topically 2 (two) times daily as needed (rash)., Disp: , Rfl:    warfarin (COUMADIN) 5 MG tablet, Take 2.5-5 mg by mouth See admin instructions. Take 2.5 mg on Monday and Friday, all the other days take 5 mg at bedtime, Disp: , Rfl:  Allergies  Allergen Reactions   Dilaudid [Hydromorphone Hcl] Other (See Comments)    Makes Ruben Murray hyper    Hydromorphone Other (See Comments)    hyperactiviity Other reaction(s): made him wild Other reaction(s): Unknown   Morphine Sulfate Other (See Comments)    Other reaction(s): at high dose causes  confusion   Tegretol [Carbamazepine] Hives   Carbamazepine Hives, Rash and Other (See Comments)    Other reaction(s): hives Other reaction(s): Hives Other reaction(s): hives     Social History   Socioeconomic History   Marital status: Married    Spouse name: Not on file   Number of children: Not on file   Years of education: Not on file   Highest education level: Not on file  Occupational History   Not on file  Tobacco Use   Smoking status: Former    Current packs/day: 0.00    Average packs/day: 3.0 packs/day for 48.0 years (144.0 ttl pk-yrs)    Types: Cigarettes    Start date: 101    Quit date: 2000    Years since quitting: 24.7   Smokeless tobacco: Never  Vaping Use   Vaping status: Never Used  Substance and Sexual Activity   Alcohol use: Never   Drug use: Never   Sexual activity: Never  Other Topics Concern   Not on file  Social History Narrative   ** Merged History Encounter **       Social Determinants of Health   Financial Resource Strain: Medium Risk (10/26/2021)   Overall Financial Resource Strain  (CARDIA)    Difficulty of Paying Living Expenses: Somewhat hard  Food Insecurity: Low Risk  (07/21/2022)   [facility]   Hunger Vital Sign    Worried About Running Out of Food in the Last Year: Never true    Ran Out of Food in the Last Year: Never true  Transportation Needs: Unmet Transportation Needs (11/16/2022)   PRAPARE - Administrator, Civil Service (Medical): Yes    Lack of Transportation (Non-Medical): No  Physical Activity: Inactive (12/15/2019)   Exercise Vital Sign    Days of Exercise per Week: 0 days    Minutes of Exercise per Session: 0 min  Stress: Stress Concern Present (09/26/2018)   [employer] of Occupational Health - Occupational Stress Questionnaire    Feeling of Stress : To some extent  Social Connections: Not on file  Intimate Partner Violence: Not At Risk (02/04/2022)   Humiliation, Afraid, Rape, and Kick questionnaire    Fear of Current or Ex-Partner: No    Emotionally Abused: No    Physically Abused: No    Sexually Abused: No    Physical Exam      Future Appointments  Date Time Provider Department Center  12/06/2022  1:30 PM [facility] None  12/06/2022  2:00 PM [facility] 1 [facility]  02/23/2023  7:00 AM [facility] DEVICE REMOTES [facility]  02/23/2023 11:45 AM Lenn Sink, DPM [facility]  03/02/2023 12:15 PM Elberta Fortis, Andree Coss, MD [facility]  05/25/2023  7:00 AM [facility] DEVICE REMOTES [facility]  08/24/2023  7:00 AM [facility] DEVICE REMOTES [facility]

## 2022-11-29 NOTE — Telephone Encounter (Signed)
See previous encounter.   Ruben Murray, Paramedic, following up--In addition to mentioned elevated INR, dark/loose stool, Paramedic mentions increased exertional SOB + clear lungs, vitals are stable. Patient reports feeling weak. Patient does not want to be sent to the ED at this time. Please contact patient with further advisement.  If questions, a call may be returned to Maralyn Sago at [phone].

## 2022-11-29 NOTE — Telephone Encounter (Signed)
Pt currently in ER at this time.

## 2022-11-29 NOTE — Telephone Encounter (Signed)
Spoke with Mr. Ruben Murray at his home visit today and he reported dark (almost black), loose stools x3 days. I noted pt's INR has been high and was concerned when hearing this. Pt has been compliant with Warfarin changes.  I also contacted [name], RN [phone]) with Dr. Malena Peer office regarding same as well.   How do you advise to proceed with his complaint?  Benson Setting EMT-P Community Paramedic  [phone]

## 2022-11-29 NOTE — ED Provider Notes (Cosign Needed Addendum)
[place] EMERGENCY DEPARTMENT AT [facility] Provider Note   CSN: [id number] Arrival date & time: 11/29/22  1310     History  Chief Complaint  Patient presents with   Diarrhea    Ruben Murray is a 77 y.o. male with PMHx COPD, HTN, CHF, HLD, HTN, mitral regurgitation, afib, seizures, tricuspid regurgitation who presents to ED concerned for black watery stools x3 weeks and generalized fatigue. Patient stating that he is on warfarin but had to decrease his dose today because his "blood was too thin". Denies new medications. Denies recent travel or drinking from unsafe water sources.   Denies fever, chest pain, dyspnea, cough, nausea, vomiting, dysuria, hematuria, abdominal pain.    Diarrhea      Home Medications Prior to Admission medications   Medication Sig Start Date End Date Taking? Authorizing Provider  acetaminophen (TYLENOL) 500 MG tablet Take 1,000 mg by mouth at bedtime.    [provider]  busPIRone (BUSPAR) 5 MG tablet Take 5 mg by mouth 2 (two) times daily. 10/26/21   [provider]  Carboxymethylcellulose Sod PF 0.5 % SOLN Place 1 drop into both eyes daily as needed (dry eyes).    [provider]  Cholecalciferol (VITAMIN D3) 25 MCG (1000 UT) CAPS Take 1,000 Units by mouth daily.    [provider]  empagliflozin (JARDIANCE) 10 MG TABS tablet Take 1 tablet (10 mg total) by mouth daily before breakfast. 10/24/22   Bensimhon, Bevelyn Buckles, MD  escitalopram (LEXAPRO) 20 MG tablet Take 20 mg by mouth at bedtime.    [provider]  finasteride (PROSCAR) 5 MG tablet Take 1 tablet (5 mg total) by mouth daily at 12 noon. 10/24/22   Bensimhon, Bevelyn Buckles, MD  fluticasone (FLONASE) 50 MCG/ACT nasal spray Place 1 spray into both nostrils daily as needed for allergies or rhinitis. 10/24/17   [provider]  loratadine (CLARITIN) 10 MG tablet Take 10 mg by mouth every morning.    [provider]  losartan  (COZAAR) 25 MG tablet Take 0.5 tablets (12.5 mg total) by mouth daily. 10/24/22   Bensimhon, Bevelyn Buckles, MD  metoprolol succinate (TOPROL-XL) 100 MG 24 hr tablet TAKE 1 TABLET BY MOUTH 2 TIMES DAILY. TAKE WITH OR IMMEDIATELY FOLLOWING A MEAL. 11/02/22   Bensimhon, Bevelyn Buckles, MD  Multiple Vitamin (MULTIVITAMIN WITH MINERALS) TABS tablet Take 1 tablet by mouth every morning.    [provider]  PHENobarbital (LUMINAL) 97.2 MG tablet Take 97.2 mg by mouth every evening. Takes between 3:00-5:00pm.    [provider]  potassium chloride SA (KLOR-CON M) 20 MEQ tablet Take 1 tablet (20 mEq total) by mouth daily. 10/24/22   Bensimhon, Bevelyn Buckles, MD  pravastatin (PRAVACHOL) 40 MG tablet Take 40 mg by mouth at bedtime.     [provider]  spironolactone (ALDACTONE) 25 MG tablet Take 0.5 tablets (12.5 mg total) by mouth daily. 10/24/22   Bensimhon, Bevelyn Buckles, MD  torsemide (DEMADEX) 20 MG tablet Take 2 tablets (40 mg total) by mouth daily. 09/04/22   Alen Bleacher, NP  triamcinolone cream (KENALOG) 0.1 % Apply 1 application  topically 2 (two) times daily as needed (rash). 05/13/21   [provider]  warfarin (COUMADIN) 5 MG tablet Take 2.5-5 mg by mouth See admin instructions. Take 2.5 mg on Monday and Friday, all the other days take 5 mg at bedtime 10/12/21         Allergies    Dilaudid [hydromorphone  hcl], Hydromorphone, Morphine sulfate, Tegretol [carbamazepine], and Carbamazepine    Review of Systems   Review of Systems  Gastrointestinal:  Positive for diarrhea.    Physical Exam Updated Vital Signs Ht 5\' 8"  (1.727 m)   Wt 108.9 kg   BMI 36.49 kg/m  Physical Exam Vitals and nursing note reviewed.  Constitutional:      General: He is not in acute distress.    Appearance: He is not ill-appearing or toxic-appearing.  HENT:     Head: Normocephalic and atraumatic.     Mouth/Throat:     Mouth: Mucous membranes are moist.  Eyes:     General: No scleral icterus.       Right  eye: No discharge.        Left eye: No discharge.     Conjunctiva/sclera: Conjunctivae normal.  Cardiovascular:     Rate and Rhythm: Normal rate and regular rhythm.     Pulses: Normal pulses.     Heart sounds: Normal heart sounds. No murmur heard. Pulmonary:     Effort: Pulmonary effort is normal. No respiratory distress.     Breath sounds: Normal breath sounds. No wheezing, rhonchi or rales.  Abdominal:     General: Abdomen is flat. Bowel sounds are normal. There is no distension.     Palpations: Abdomen is soft. There is no mass.     Tenderness: There is no abdominal tenderness.  Musculoskeletal:     Right lower leg: No edema.     Left lower leg: No edema.  Skin:    General: Skin is warm and dry.     Findings: No rash.  Neurological:     General: No focal deficit present.     Mental Status: He is alert. Mental status is at baseline.  Psychiatric:        Mood and Affect: Mood normal.        Behavior: Behavior normal.     ED Results / Procedures / Treatments   Labs (all labs ordered are listed, but only abnormal results are displayed) Labs Reviewed - No data to display  EKG None  Radiology No results found.  Procedures Procedures    Medications Ordered in ED Medications - No data to display  ED Course/ Medical Decision Making/ A&P                                 Medical Decision Making Amount and/or Complexity of Data Reviewed Labs: ordered. Radiology: ordered.   This patient presents to the ED for concern of black watery stool, this involves an extensive number of treatment options, and is a complaint that carries with it a high risk of complications and morbidity.  The differential diagnosis includes melena, GI bleed/hemorrhage, medication ADR, dehydration   Co morbidities that complicate the patient evaluation   COPD, HTN, CHF, HLD, HTN, mitral regurgitation, afib, seizures, tricuspid regurgitation   Additional history obtained:  Dr. Anne Fu  Cardiologist   Lab Tests:  I Ordered, and personally interpreted labs.  The pertinent results include:   -BNP: pending -Tropinin: pending -CMP: pending -CBC: No concern for anemia or leukocytosis -PT-INR: elevated   Imaging Studies ordered:  I ordered imaging studies including  -chest xray:to assess for process contributing to patient's symptoms. - CT abdomen/pelvis: to assess for process contributing to patient's symptoms. I independently visualized and interpreted imaging  I agree with the radiologist interpretation    Problem List /  ED Course / Critical interventions / Medication management  Patient presents to ED concerned for black watery stools x 3 weeks.  Patient stating that he takes warfarin but recently had to reduce his dose because his "blood was too thin".  Stating that he has been having generalized weakness/SOB since the diarrhea began.  Denying abdominal pain.  Denying nausea/vomiting. DRE without obvious blood on stool sample. CBC without leukocytosis or anemia.  PT/INR is elevated. Rest of labs pending. Staffed patient with Dr. Suezanne Jacquet who was able to see patient and concerned for his generalized weakness and associated SOB - added on further cardiac workup to assess for process.  I have reviewed the patients home medicines and have made adjustments as needed   Social Determinants of Health:  none  3:50 PM Care of Marcelline Mates transferred to [employer] at the end of my shift as the patient will require reassessment once labs/imaging have resulted. Patient presentation, ED course, and plan of care discussed with review of all pertinent labs and imaging. Please see his/her note for further details regarding further ED course and disposition. Plan at time of handoff is reassess patient after imaging and blood workup. If patient is without concern for acute GI bleed, he should be appropriate for outpatient follow up. This may be altered or completely changed  at the discretion of the oncoming team pending results of further workup.          Final Clinical Impression(s) / ED Diagnoses Final diagnoses:  None    Rx / DC Orders ED Discharge Orders     None            Margarita Rana 11/29/22 1617    Lonell Grandchild, MD 11/30/22 1555

## 2022-11-29 NOTE — ED Provider Notes (Signed)
Signout from [employer] at shift change. Briefly, patient presents for diarrhea for 3 weeks, generalized fatigue, possible melena.  He is on warfarin and reports INR was recently elevated and dosage was cut back.  Patient does have significant cardiac history.   Plan: Awaiting completion of workup and imaging   4:11 PM  Labs and imaging personally reviewed and interpreted including: CBC unremarkable with normal hemoglobin and white blood cell count; INR was therapeutic at 2.2.  CMP, BNP, troponin, chest x-ray pending.  CT abdomen pelvis pending.  Most current vital signs reviewed and are as follows: BP 110/73   Pulse 80   Temp 97.9 F (36.6 C) (Oral)   Resp 17   Ht 5\' 8"  (1.727 m)   Wt 108.9 kg   SpO2 100%   BMI 36.49 kg/m   7:26 PM Reassessment performed. Patient appears well.  No abdominal pain on exam.  Labs personally reviewed and interpreted including: Fecal occult stool testing was negative; CMP with elevated bicarb at 34 otherwise unremarkable; BNP was slightly elevated at 291 however this is close to the patient's baseline, troponins were normal.  Imaging personally visualized and interpreted including: CT of the abdomen pelvis, agree negative.  Reviewed pertinent lab work and imaging with patient at bedside. Questions answered.   Most current vital signs reviewed and are as follows: BP 111/75   Pulse 80   Temp 98.2 F (36.8 C) (Oral)   Resp 18   Ht 5\' 8"  (1.727 m)   Wt 108.9 kg   SpO2 100%   BMI 36.49 kg/m   Plan: Discharge to home.   Prescriptions written for: None  Other home care instructions discussed: Maintain good hydration, OTC meds for diarrhea if desired  ED return instructions discussed: The patient was urged to return to the Emergency Department immediately with worsening of current symptoms, worsening abdominal pain, persistent vomiting, blood noted in stools, fever, or any other concerns. The patient verbalized understanding.   Follow-up  instructions discussed: Patient encouraged to follow-up with their PCP in 2 days.      Renne Crigler, PA-C 11/29/22 Lawanna Kobus, MD 11/30/22 [phone]

## 2022-11-29 NOTE — Telephone Encounter (Signed)
If pt is having any type of change in stools (pt did not report at 11/28/22 visit) that are concerning he needs to seek treatment with a Provider. I am glad to know that you have place a call to PCP as they can provide medical guidance and testing regarding this.

## 2022-11-29 NOTE — ED Triage Notes (Signed)
"  He is part of the heart failure clinic/program with [facility]. Over the last 3 weeks his shortness of breath on exertion has gotten worse. Today the paramedic called and said his INR is 3.1 and the last 3 days he has had black watery stools" per EMS Patient denies any complaints or symptoms at this time.

## 2022-11-30 ENCOUNTER — Telehealth (HOSPITAL_COMMUNITY): Payer: Self-pay | Admitting: Emergency Medicine

## 2022-11-30 DIAGNOSIS — R197 Diarrhea, unspecified: Secondary | ICD-10-CM | POA: Diagnosis not present

## 2022-11-30 NOTE — Telephone Encounter (Signed)
Following up with Mr.  Ruben Murray after discharge from the ED. Pt reports he is feeling well, but tired. No med changes were noted in discharge notes. Pt was advised to reach out if he experienced any similar symotoms.   Will follow up in the home next week.   Benson Setting EMT-P Community Paramedic  [phone]

## 2022-12-01 ENCOUNTER — Telehealth (HOSPITAL_COMMUNITY): Payer: Self-pay | Admitting: Licensed Clinical Social Worker

## 2022-12-01 NOTE — Telephone Encounter (Signed)
H&V Care Navigation CSW Progress Note  Clinical Social Worker spoke with pt who confirms he received the [employer] application and has his friendy [place] assisting in filling out.  Reports he has no immedicate needs with transportation as his friends are driving him to his upcoming appointments.  Understands necessity of completing [facility] application so he can have help with other appts if his friends are unable to bring him.   SDOH Screenings   Food Insecurity: Low Risk  (07/21/2022)   [facility]  Housing: Medium Risk (02/04/2022)  Transportation Needs: Unmet Transportation Needs (11/16/2022)  Utilities: Low Risk  (07/21/2022)   [facility]  Depression (PHQ2-9): Low Risk  (04/26/2021)  Financial Resource Strain: Medium Risk (10/26/2021)  Physical Activity: Inactive (12/15/2019)  Stress: Stress Concern Present (09/26/2018)  Tobacco Use: Medium Risk (11/29/2022)    Burna Sis, LCSW Clinical Social Worker [facility] Desk#: [phone] Cell#: [phone]

## 2022-12-04 NOTE — Progress Notes (Signed)
Remote ICD transmission.   

## 2022-12-06 ENCOUNTER — Other Ambulatory Visit (HOSPITAL_COMMUNITY): Payer: Self-pay | Admitting: Emergency Medicine

## 2022-12-06 ENCOUNTER — Ambulatory Visit (INDEPENDENT_AMBULATORY_CARE_PROVIDER_SITE_OTHER): Payer: No Typology Code available for payment source

## 2022-12-06 ENCOUNTER — Telehealth (HOSPITAL_COMMUNITY): Payer: Self-pay | Admitting: Emergency Medicine

## 2022-12-06 ENCOUNTER — Ambulatory Visit: Payer: No Typology Code available for payment source | Attending: Cardiology

## 2022-12-06 DIAGNOSIS — I428 Other cardiomyopathies: Secondary | ICD-10-CM

## 2022-12-06 DIAGNOSIS — I4891 Unspecified atrial fibrillation: Secondary | ICD-10-CM

## 2022-12-06 DIAGNOSIS — I4821 Permanent atrial fibrillation: Secondary | ICD-10-CM

## 2022-12-06 DIAGNOSIS — Z7901 Long term (current) use of anticoagulants: Secondary | ICD-10-CM | POA: Diagnosis not present

## 2022-12-06 LAB — CUP PACEART INCLINIC DEVICE CHECK
Date Time Interrogation Session: 20241002152623
Implantable Lead Connection Status: 753985
Implantable Lead Connection Status: 753985
Implantable Lead Connection Status: 753985
Implantable Lead Implant Date: 20231201
Implantable Lead Implant Date: 20231201
Implantable Lead Implant Date: 20231201
Implantable Lead Location: 753858
Implantable Lead Location: 753859
Implantable Lead Location: 753860
Implantable Pulse Generator Implant Date: 20231201
Pulse Gen Serial Number: 211010361

## 2022-12-06 LAB — POCT INR: INR: 2.9 (ref 2.0–3.0)

## 2022-12-06 NOTE — Telephone Encounter (Signed)
Met with Mr. Byers at [facility] for his device check. Pt reported to their staff that his shortness of breath had not improved from his hospitalization. Pt advised its only upon ambulation, no SHOB at rest. Staff indicated that post ablation, his body cannot compensate for exertion. Staff added "Rate Response" to his device to increase to 100 upon ambulation. Pt was advised to report back on how he is feeling.  Just FYI on same, let me know if you require any changes.   No edema noted in abdomen, legs. No difficulty breathing at rest.   245.4 lbs today, last visit was 243 lbs  Benson Setting [facility] Paramedic  [phone]

## 2022-12-06 NOTE — Progress Notes (Signed)
Met with Mr[facility]en [facility] at [facility] for his device check. Pt reported to their staff that his shortness of breath had not improved from his hospitalization. Pt advised its only upon ambulation, no SHOB at rest. Staff indicated that post ablation, his body cannot compensate for exertion. Staff added "Rate Response" to his device to increase to 100 upon ambulation. Pt was advised to report back on how he is feeling. Pt's pill box was filled for 1x week, upcoming appointments reviewed. Will follow up in 1x week in the home.   Benson Setting EMT-P Community Paramedic  [phone]

## 2022-12-06 NOTE — Telephone Encounter (Signed)
Called Ruben Murray to remind him of both of [facility].   I also texted his friends that take him to his appointments so they had the information as well.   Will follow up after his appointments to complete his pill box.   Benson Setting EMT-P Community Paramedic  [phone]

## 2022-12-06 NOTE — Patient Instructions (Signed)
Description   Continue taking warfarin 1 tablet daily except 1/2 tablet on Mondays, Wednesdays, and Fridays. Report to ER with any bleeding, falls, accidents, incidents. Recheck INR in 3 weeks. [facility] [phone] or [phone]

## 2022-12-06 NOTE — Patient Instructions (Signed)
Follow up as scheduled.  

## 2022-12-06 NOTE — Progress Notes (Signed)
Patient seen in device clinic lower rate changed to 70. Reprogrammed to VVIR due to flat histograms. Dr. Elberta Fortis made aware.

## 2022-12-08 NOTE — Telephone Encounter (Signed)
Please call and advise to reach out to EP clinic.   Elizabethann Lackey NP-C  3:53 PM

## 2022-12-13 ENCOUNTER — Other Ambulatory Visit (HOSPITAL_COMMUNITY): Payer: Self-pay | Admitting: Emergency Medicine

## 2022-12-13 DIAGNOSIS — J449 Chronic obstructive pulmonary disease, unspecified: Secondary | ICD-10-CM | POA: Diagnosis not present

## 2022-12-13 DIAGNOSIS — Z515 Encounter for palliative care: Secondary | ICD-10-CM | POA: Diagnosis not present

## 2022-12-13 DIAGNOSIS — I5022 Chronic systolic (congestive) heart failure: Secondary | ICD-10-CM | POA: Diagnosis not present

## 2022-12-13 DIAGNOSIS — R0609 Other forms of dyspnea: Secondary | ICD-10-CM | POA: Diagnosis not present

## 2022-12-13 DIAGNOSIS — I480 Paroxysmal atrial fibrillation: Secondary | ICD-10-CM | POA: Diagnosis not present

## 2022-12-13 NOTE — Progress Notes (Signed)
Paramedicine Encounter    Patient ID: Ruben Murray, male    DOB: 1945/04/05, 77 y.o.   MRN: [id number]   Complaints - continued exertional shortness of breath.   Assessment - lung sounds clear, no edema or ascities noted  Compliance with meds - missed 1 full day worth of medication, advised that he was sleeping all day yesterday.   Pill box filled - for 1x week   Refills needed - none  Meds changes since last visit - none    Social changes - increased depression, PCP brought hospice in, will follow up with same for possible counseling.    VISIT SUMMARY**  Met with Mr. Ruben Murray in his home. Pt reported that he had just returned from his PCP appointment. Pt reported that he had some honest conversations with his PCP regarding decreasing health and progressing depression. Pt reported that they were planning to bring hospice in regarding same. I contacted Ruben Murray, Dr. Malena Murray nurse, to ask further on the findings leading to this referral. Ruben Murray read off of the PCP's notes "CHF, COPD, and declining condition". No noted medication changes, only added Breztri inhaler, will add next week when pt begins it. Pt reports that he has nothing and no one to live for anymore and with his worsening condition, is not enjoying his life. Pt did not mention that he wanted to stop his treatments at this time and reported that he would continue to take his medication as prescribed. Pt reported dyspnea, but upon ambulation, pt appeared to have an improved presentation than last week and the week before. Will continue to follow up on mental and physical status of pt. Pt's pill box was filled for 1x week. Upcoming appointments were reviewed and calendar updated. Will follow up in 1x week.   BP 122/72   Pulse 78   Wt 246 lb 12.8 oz (111.9 kg)   SpO2 97%   BMI 37.53 kg/m  Weight yesterday-247.2lbs  Last visit weight-243lbs   Benson Setting EMT-P Community Paramedic  [phone]     ACTION: Home visit  completed     Patient Care Team: Aliene Beams, MD as PCP - General (Family Medicine) Jake Bathe, MD as PCP - Cardiology (Cardiology) Regan Lemming, MD as PCP - Electrophysiology (Cardiology) Aliene Beams, MD (Family Medicine) [facility] as Case Manager (Hospice and Palliative Medicine)  Patient Active Problem List   Diagnosis Date Noted   Heart failure with reduced ejection fraction (HCC) 11/15/2022   CHF (congestive heart failure) (HCC) 02/03/2022   Atrial fibrillation (HCC) 11/02/2021   Long term (current) use of anticoagulants 11/02/2021   AKI (acute kidney injury) (HCC) 07/27/2021   Acute pulmonary embolism (HCC) 07/26/2021   Acute on chronic systolic CHF (congestive heart failure) (HCC) 05/17/2021   Hypercoagulable state due to permanent atrial fibrillation (HCC) 05/10/2021   Left leg pain 04/24/2021   OSA on CPAP 04/24/2021   Mitral regurgitation 04/24/2021   Tricuspid regurgitation 04/24/2021   Dilated aortic root (HCC) 04/24/2021   Permanent atrial fibrillation (HCC) 04/19/2021   Chronic arthropathy 04/13/2020   Callus 04/13/2020   Onychomycosis 12/12/2019   Left leg cellulitis 12/12/2019   Chronic systolic heart failure (HCC) 12/09/2019   Dilated cardiomyopathy (HCC)    Atrial flutter (HCC) 11/03/2019   Atrial flutter with rapid ventricular response (HCC) 11/03/2019   Acquired thrombophilia (HCC) 01/15/2019   Atrial fibrillation with rapid ventricular response (HCC) 10/29/2017   Chronic combined systolic and diastolic heart failure (HCC) 10/29/2017  Prolonged QT interval 10/29/2017   Sensorineural hearing loss (SNHL) of both ears 01/31/2017   Dysphonia 10/03/2016   Laryngopharyngeal reflux (LPR) 10/03/2016   Nasal polyps 10/03/2016   Rhinitis medicamentosa 10/03/2016   Chronic laryngitis 10/03/2016   Hoarseness 08/31/2016   Moderate persistent asthma 08/02/2016   COPD (chronic obstructive pulmonary disease) (HCC) 08/02/2016    Morbid (severe) obesity due to excess calories (HCC) 08/02/2016   Community acquired pneumonia 03/06/2016   Essential hypertension 03/06/2016   Mixed hyperlipidemia 03/06/2016   Right thyroid nodule 03/06/2016   Seizures (HCC) 03/06/2016   Fever 03/12/2015   Recurrent erosion of cornea 06/23/2011    Current Outpatient Medications:    acetaminophen (TYLENOL) 500 MG tablet, Take 1,000 mg by mouth at bedtime., Disp: , Rfl:    busPIRone (BUSPAR) 5 MG tablet, Take 5 mg by mouth 2 (two) times daily., Disp: , Rfl:    Carboxymethylcellulose Sod PF 0.5 % SOLN, Place 1 drop into both eyes daily as needed (dry eyes)., Disp: , Rfl:    Cholecalciferol (VITAMIN D3) 25 MCG (1000 UT) CAPS, Take 1,000 Units by mouth daily., Disp: , Rfl:    empagliflozin (JARDIANCE) 10 MG TABS tablet, Take 1 tablet (10 mg total) by mouth daily before breakfast., Disp: 90 tablet, Rfl: 3   escitalopram (LEXAPRO) 20 MG tablet, Take 20 mg by mouth at bedtime., Disp: , Rfl:    finasteride (PROSCAR) 5 MG tablet, Take 1 tablet (5 mg total) by mouth daily at 12 noon., Disp: 30 tablet, Rfl: 1   fluticasone (FLONASE) 50 MCG/ACT nasal spray, Place 1 spray into both nostrils daily as needed for allergies or rhinitis., Disp: , Rfl:    loratadine (CLARITIN) 10 MG tablet, Take 10 mg by mouth every morning., Disp: , Rfl:    losartan (COZAAR) 25 MG tablet, Take 0.5 tablets (12.5 mg total) by mouth daily., Disp: 45 tablet, Rfl: 3   metoprolol succinate (TOPROL-XL) 100 MG 24 hr tablet, TAKE 1 TABLET BY MOUTH 2 TIMES DAILY. TAKE WITH OR IMMEDIATELY FOLLOWING A MEAL., Disp: 180 tablet, Rfl: 3   Multiple Vitamin (MULTIVITAMIN WITH MINERALS) TABS tablet, Take 1 tablet by mouth every morning., Disp: , Rfl:    PHENobarbital (LUMINAL) 97.2 MG tablet, Take 97.2 mg by mouth every evening. Takes between 3:00-5:00pm., Disp: , Rfl:    potassium chloride SA (KLOR-CON M) 20 MEQ tablet, Take 1 tablet (20 mEq total) by mouth daily., Disp: 90 tablet, Rfl: 3    pravastatin (PRAVACHOL) 40 MG tablet, Take 40 mg by mouth at bedtime. , Disp: , Rfl:    spironolactone (ALDACTONE) 25 MG tablet, Take 0.5 tablets (12.5 mg total) by mouth daily., Disp: 45 tablet, Rfl: 2   torsemide (DEMADEX) 20 MG tablet, Take 2 tablets (40 mg total) by mouth daily., Disp: 180 tablet, Rfl: 3   triamcinolone cream (KENALOG) 0.1 %, Apply 1 application  topically 2 (two) times daily as needed (rash)., Disp: , Rfl:    warfarin (COUMADIN) 5 MG tablet, Take 2.5-5 mg by mouth See admin instructions. Take 2.5 mg on Monday and Friday, all the other days take 5 mg at bedtime, Disp: , Rfl:  Allergies  Allergen Reactions   Dilaudid [Hydromorphone Hcl] Other (See Comments)    Makes pt hyper    Hydromorphone Other (See Comments)    hyperactiviity Other reaction(s): made him wild Other reaction(s): Unknown   Morphine Sulfate Other (See Comments)    Other reaction(s): at high dose causes confusion   Tegretol [Carbamazepine] Hives  Carbamazepine Hives, Rash and Other (See Comments)    Other reaction(s): hives Other reaction(s): Hives Other reaction(s): hives     Social History   Socioeconomic History   Marital status: Married    Spouse name: Not on file   Number of children: Not on file   Years of education: Not on file   Highest education level: Not on file  Occupational History   Not on file  Tobacco Use   Smoking status: Former    Current packs/day: 0.00    Average packs/day: 3.0 packs/day for 48.0 years (144.0 ttl pk-yrs)    Types: Cigarettes    Start date: 60    Quit date: 2000    Years since quitting: 24.7   Smokeless tobacco: Never  Vaping Use   Vaping status: Never Used  Substance and Sexual Activity   Alcohol use: Never   Drug use: Never   Sexual activity: Never  Other Topics Concern   Not on file  Social History Narrative   ** Merged History Encounter **       Social Determinants of Health   Financial Resource Strain: Medium Risk (10/26/2021)    Overall Financial Resource Strain (CARDIA)    Difficulty of Paying Living Expenses: Somewhat hard  Food Insecurity: Low Risk  (07/21/2022)   [facility]   Hunger Vital Sign    Worried About Running Out of Food in the Last Year: Never true    Ran Out of Food in the Last Year: Never true  Transportation Needs: Unmet Transportation Needs (11/16/2022)   PRAPARE - Administrator, Civil Service (Medical): Yes    Lack of Transportation (Non-Medical): No  Physical Activity: Inactive (12/15/2019)   Exercise Vital Sign    Days of Exercise per Week: 0 days    Minutes of Exercise per Session: 0 min  Stress: Stress Concern Present (09/26/2018)   [employer] of Occupational Health - Occupational Stress Questionnaire    Feeling of Stress : To some extent  Social Connections: Not on file  Intimate Partner Violence: Not At Risk (02/04/2022)   Humiliation, Afraid, Rape, and Kick questionnaire    Fear of Current or Ex-Partner: No    Emotionally Abused: No    Physically Abused: No    Sexually Abused: No    Physical Exam      Future Appointments  Date Time Provider Department Center  12/27/2022  2:00 PM [facility] None  02/23/2023  7:00 AM [facility]  02/23/2023 11:45 AM Lenn Sink, DPM [facility]  03/02/2023 12:15 PM Elberta Fortis, Andree Coss, MD [facility]  05/25/2023  7:00 AM [facility]  08/24/2023  7:00 AM [facility]

## 2022-12-14 ENCOUNTER — Telehealth (HOSPITAL_COMMUNITY): Payer: Self-pay | Admitting: Emergency Medicine

## 2022-12-14 NOTE — Telephone Encounter (Signed)
Called Ruben Murray to inform him of his newly scheduled appointment with Dr. Gala Romney on 12/25/2022 at 11:20. Pt reported that he wrote it on his calendar and a reminder text was sent to [name]. Will remind again at our home visit next week.   Benson Setting EMT-P Community Paramedic  [phone]

## 2022-12-17 DIAGNOSIS — I5042 Chronic combined systolic (congestive) and diastolic (congestive) heart failure: Secondary | ICD-10-CM | POA: Diagnosis not present

## 2022-12-19 ENCOUNTER — Telehealth (HOSPITAL_COMMUNITY): Payer: Self-pay | Admitting: Vascular Surgery

## 2022-12-19 NOTE — Telephone Encounter (Signed)
Called pt to move 10/21 appt to 10/23 @ 1120 w/ db, no answer. No vm set up

## 2022-12-20 ENCOUNTER — Other Ambulatory Visit (HOSPITAL_COMMUNITY): Payer: Self-pay | Admitting: Emergency Medicine

## 2022-12-20 NOTE — Telephone Encounter (Signed)
Spoke with Mr. Sexson at his home visit and person in charge of his transportation and advised them of the change. I also updated his calendar to reflect change for easy reference.   They are aware and are planning to be present.   Benson Setting EMT-P Community Paramedic  [phone]

## 2022-12-20 NOTE — Progress Notes (Signed)
Paramedicine Encounter    Patient ID: Ruben Murray, male    DOB: 1945-11-26, 77 y.o.   MRN: [id number]   Complaints - new onset of rash to bilateral shoulders/upper arms worsening x3 weeks.   Assessment - lung sounds clear, no noted pedal edema  Compliance with meds - no missed doses noted.   Pill box filled - for 1x week   Refills needed - none  Meds changes since last visit - none    Social changes - Hospice to assess on Friday.        - Having concerns with rent, has a meeting on Tuesday for same. Cannot find pension information.    VISIT SUMMARY**  Met with Ruben Murray in the home today. Ruben Murray seemed to be getting around well without assistive devices or having to stop to breathe. Pt advised that he was feeling well and seemed to be in good spirits. Pt [facility] and had a home visit scheduled for Friday. Will follow up on same after visit. Pt reported that he had been using his wife's readers and needed to get some for his eyes. Pt requested assistance setting up an eye exam at [facility] and I completed same. Pt also reported concerns for his rent. I helped connect him to the correct resources to find his pension information necessary for the meeting. Pt's pill box was filled for 1x week. Upcoming appointments were reviewed and calendar updated with same. I will follow up with pt in the clinic in 1x week.   BP 122/74   Pulse 72   Resp 16   Wt 244 lb 6.4 oz (110.9 kg)   SpO2 95%   BMI 37.16 kg/m  Weight yesterday-241.8 lbs Last visit [id number]  Benson Setting EMT-P Community Paramedic  [phone]     ACTION: Home visit completed     Patient Care Team: Aliene Beams, MD as PCP - General (Family Medicine) Jake Bathe, MD as PCP - Cardiology (Cardiology) Regan Lemming, MD as PCP - Electrophysiology (Cardiology) Aliene Beams, MD (Family Medicine) [facility] as Case Manager  (Hospice and Palliative Medicine)  Patient Active Problem List   Diagnosis Date Noted   Heart failure with reduced ejection fraction (HCC) 11/15/2022   CHF (congestive heart failure) (HCC) 02/03/2022   Atrial fibrillation (HCC) 11/02/2021   Long term (current) use of anticoagulants 11/02/2021   AKI (acute kidney injury) (HCC) 07/27/2021   Acute pulmonary embolism (HCC) 07/26/2021   Acute on chronic systolic CHF (congestive heart failure) (HCC) 05/17/2021   Hypercoagulable state due to permanent atrial fibrillation (HCC) 05/10/2021   Left leg pain 04/24/2021   OSA on CPAP 04/24/2021   Mitral regurgitation 04/24/2021   Tricuspid regurgitation 04/24/2021   Dilated aortic root (HCC) 04/24/2021   Permanent atrial fibrillation (HCC) 04/19/2021   Chronic arthropathy 04/13/2020   Callus 04/13/2020   Onychomycosis 12/12/2019   Left leg cellulitis 12/12/2019   Chronic systolic heart failure (HCC) 12/09/2019   Dilated cardiomyopathy (HCC)    Atrial flutter (HCC) 11/03/2019   Atrial flutter with rapid ventricular response (HCC) 11/03/2019   Acquired thrombophilia (HCC) 01/15/2019   Atrial fibrillation with rapid ventricular response (HCC) 10/29/2017   Chronic combined systolic and diastolic heart failure (HCC) 10/29/2017   Prolonged QT interval 10/29/2017   Sensorineural hearing loss (SNHL) of both ears 01/31/2017   Dysphonia 10/03/2016   Laryngopharyngeal reflux (LPR) 10/03/2016   Nasal polyps 10/03/2016  Rhinitis medicamentosa 10/03/2016   Chronic laryngitis 10/03/2016   Hoarseness 08/31/2016   Moderate persistent asthma 08/02/2016   COPD (chronic obstructive pulmonary disease) (HCC) 08/02/2016   Morbid (severe) obesity due to excess calories (HCC) 08/02/2016   Community acquired pneumonia 03/06/2016   Essential hypertension 03/06/2016   Mixed hyperlipidemia 03/06/2016   Right thyroid nodule 03/06/2016   Seizures (HCC) 03/06/2016   Fever 03/12/2015   Recurrent erosion of cornea  06/23/2011    Current Outpatient Medications:    acetaminophen (TYLENOL) 500 MG tablet, Take 1,000 mg by mouth at bedtime., Disp: , Rfl:    busPIRone (BUSPAR) 5 MG tablet, Take 5 mg by mouth 2 (two) times daily., Disp: , Rfl:    Carboxymethylcellulose Sod PF 0.5 % SOLN, Place 1 drop into both eyes daily as needed (dry eyes)., Disp: , Rfl:    Cholecalciferol (VITAMIN D3) 25 MCG (1000 UT) CAPS, Take 1,000 Units by mouth daily., Disp: , Rfl:    empagliflozin (JARDIANCE) 10 MG TABS tablet, Take 1 tablet (10 mg total) by mouth daily before breakfast., Disp: 90 tablet, Rfl: 3   escitalopram (LEXAPRO) 20 MG tablet, Take 20 mg by mouth at bedtime., Disp: , Rfl:    finasteride (PROSCAR) 5 MG tablet, Take 1 tablet (5 mg total) by mouth daily at 12 noon., Disp: 30 tablet, Rfl: 1   fluticasone (FLONASE) 50 MCG/ACT nasal spray, Place 1 spray into both nostrils daily as needed for allergies or rhinitis., Disp: , Rfl:    loratadine (CLARITIN) 10 MG tablet, Take 10 mg by mouth every morning., Disp: , Rfl:    losartan (COZAAR) 25 MG tablet, Take 0.5 tablets (12.5 mg total) by mouth daily., Disp: 45 tablet, Rfl: 3   metoprolol succinate (TOPROL-XL) 100 MG 24 hr tablet, TAKE 1 TABLET BY MOUTH 2 TIMES DAILY. TAKE WITH OR IMMEDIATELY FOLLOWING A MEAL., Disp: 180 tablet, Rfl: 3   Multiple Vitamin (MULTIVITAMIN WITH MINERALS) TABS tablet, Take 1 tablet by mouth every morning., Disp: , Rfl:    PHENobarbital (LUMINAL) 97.2 MG tablet, Take 97.2 mg by mouth every evening. Takes between 3:00-5:00pm., Disp: , Rfl:    potassium chloride SA (KLOR-CON M) 20 MEQ tablet, Take 1 tablet (20 mEq total) by mouth daily., Disp: 90 tablet, Rfl: 3   pravastatin (PRAVACHOL) 40 MG tablet, Take 40 mg by mouth at bedtime. , Disp: , Rfl:    spironolactone (ALDACTONE) 25 MG tablet, Take 0.5 tablets (12.5 mg total) by mouth daily., Disp: 45 tablet, Rfl: 2   torsemide (DEMADEX) 20 MG tablet, Take 2 tablets (40 mg total) by mouth daily., Disp:  180 tablet, Rfl: 3   triamcinolone cream (KENALOG) 0.1 %, Apply 1 application  topically 2 (two) times daily as needed (rash)., Disp: , Rfl:    warfarin (COUMADIN) 5 MG tablet, Take 2.5-5 mg by mouth See admin instructions. Take 2.5 mg on Monday and Friday, all the other days take 5 mg at bedtime, Disp: , Rfl:  Allergies  Allergen Reactions   Dilaudid [Hydromorphone Hcl] Other (See Comments)    Makes pt hyper    Hydromorphone Other (See Comments)    hyperactiviity Other reaction(s): made him wild Other reaction(s): Unknown   Morphine Sulfate Other (See Comments)    Other reaction(s): at high dose causes confusion   Tegretol [Carbamazepine] Hives   Carbamazepine Hives, Rash and Other (See Comments)    Other reaction(s): hives Other reaction(s): Hives Other reaction(s): hives     Social History   Socioeconomic History  Marital status: Married    Spouse name: Not on file   Number of children: Not on file   Years of education: Not on file   Highest education level: Not on file  Occupational History   Not on file  Tobacco Use   Smoking status: Former    Current packs/day: 0.00    Average packs/day: 3.0 packs/day for 48.0 years (144.0 ttl pk-yrs)    Types: Cigarettes    Start date: 89    Quit date: 2000    Years since quitting: 24.8   Smokeless tobacco: Never  Vaping Use   Vaping status: Never Used  Substance and Sexual Activity   Alcohol use: Never   Drug use: Never   Sexual activity: Never  Other Topics Concern   Not on file  Social History Narrative   ** Merged History Encounter **       Social Determinants of Health   Financial Resource Strain: Medium Risk (10/26/2021)   Overall Financial Resource Strain (CARDIA)    Difficulty of Paying Living Expenses: Somewhat hard  Food Insecurity: Low Risk  (07/21/2022)   [facility]   Hunger Vital Sign    Worried About Running Out of Food in the Last Year: Never true    Ran Out of Food in  the Last Year: Never true  Transportation Needs: Unmet Transportation Needs (11/16/2022)   PRAPARE - Administrator, Civil Service (Medical): Yes    Lack of Transportation (Non-Medical): No  Physical Activity: Inactive (12/15/2019)   Exercise Vital Sign    Days of Exercise per Week: 0 days    Minutes of Exercise per Session: 0 min  Stress: Stress Concern Present (09/26/2018)   [employer] of Occupational Health - Occupational Stress Questionnaire    Feeling of Stress : To some extent  Social Connections: Not on file  Intimate Partner Violence: Not At Risk (02/04/2022)   Humiliation, Afraid, Rape, and Kick questionnaire    Fear of Current or Ex-Partner: No    Emotionally Abused: No    Physically Abused: No    Sexually Abused: No    Physical Exam      Future Appointments  Date Time Provider Department Center  12/27/2022 11:20 AM Bensimhon, Bevelyn Buckles, MD [facility] None  12/27/2022  2:00 PM [facility] None  02/23/2023  7:00 AM [facility]  02/23/2023 11:45 AM Lenn Sink, DPM [facility]  03/02/2023 12:15 PM Regan Lemming, MD [facility]  05/25/2023  7:00 AM [facility]  08/24/2023  7:00 AM [facility]

## 2022-12-22 ENCOUNTER — Telehealth: Payer: Self-pay

## 2022-12-22 DIAGNOSIS — G4733 Obstructive sleep apnea (adult) (pediatric): Secondary | ICD-10-CM

## 2022-12-22 NOTE — Telephone Encounter (Signed)
Notified patient's caregiver, per DPR, Paulino Rily of sleep study results and recommendations. CPAP order sent to [facility] today 12/22/22.

## 2022-12-22 NOTE — Telephone Encounter (Signed)
-----   Message from Armanda Magic sent at 11/21/2022  5:39 PM EDT ----- Please let patient know that they have sleep apnea.  Recommend therapeutic CPAP titration for treatment of patient's sleep disordered breathing.  If unable to perform an in lab titration then initiate ResMed auto CPAP from 4 to 15cm H2O with heated humidity and mask of choice and overnight pulse ox on CPAP.

## 2022-12-25 ENCOUNTER — Encounter (HOSPITAL_COMMUNITY): Payer: No Typology Code available for payment source | Admitting: Internal Medicine

## 2022-12-27 ENCOUNTER — Ambulatory Visit: Payer: No Typology Code available for payment source

## 2022-12-27 ENCOUNTER — Ambulatory Visit
Admission: RE | Admit: 2022-12-27 | Discharge: 2022-12-27 | Disposition: A | Discharge status: Home / Self Care | Payer: No Typology Code available for payment source | Source: Ambulatory Visit | Attending: Internal Medicine | Admitting: Internal Medicine

## 2022-12-27 ENCOUNTER — Other Ambulatory Visit (HOSPITAL_COMMUNITY): Payer: Self-pay | Admitting: Emergency Medicine

## 2022-12-27 ENCOUNTER — Encounter (HOSPITAL_COMMUNITY): Payer: Self-pay | Admitting: Internal Medicine

## 2022-12-27 VITALS — BP 102/74 | HR 75 | Wt 248.6 lb

## 2022-12-27 DIAGNOSIS — Z91148 Patient's other noncompliance with medication regimen for other reason: Secondary | ICD-10-CM | POA: Diagnosis not present

## 2022-12-27 DIAGNOSIS — I272 Pulmonary hypertension, unspecified: Secondary | ICD-10-CM | POA: Insufficient documentation

## 2022-12-27 DIAGNOSIS — I4821 Permanent atrial fibrillation: Secondary | ICD-10-CM | POA: Diagnosis not present

## 2022-12-27 DIAGNOSIS — Z79899 Other long term (current) drug therapy: Secondary | ICD-10-CM | POA: Insufficient documentation

## 2022-12-27 DIAGNOSIS — G40909 Epilepsy, unspecified, not intractable, without status epilepticus: Secondary | ICD-10-CM | POA: Insufficient documentation

## 2022-12-27 DIAGNOSIS — G4733 Obstructive sleep apnea (adult) (pediatric): Secondary | ICD-10-CM | POA: Insufficient documentation

## 2022-12-27 DIAGNOSIS — Z95810 Presence of automatic (implantable) cardiac defibrillator: Secondary | ICD-10-CM | POA: Diagnosis not present

## 2022-12-27 DIAGNOSIS — Z7901 Long term (current) use of anticoagulants: Secondary | ICD-10-CM

## 2022-12-27 DIAGNOSIS — I5022 Chronic systolic (congestive) heart failure: Secondary | ICD-10-CM | POA: Diagnosis not present

## 2022-12-27 DIAGNOSIS — Z66 Do not resuscitate: Secondary | ICD-10-CM | POA: Insufficient documentation

## 2022-12-27 DIAGNOSIS — I110 Hypertensive heart disease with heart failure: Secondary | ICD-10-CM | POA: Insufficient documentation

## 2022-12-27 DIAGNOSIS — R0602 Shortness of breath: Secondary | ICD-10-CM | POA: Diagnosis not present

## 2022-12-27 DIAGNOSIS — I428 Other cardiomyopathies: Secondary | ICD-10-CM | POA: Insufficient documentation

## 2022-12-27 DIAGNOSIS — I4891 Unspecified atrial fibrillation: Secondary | ICD-10-CM | POA: Diagnosis not present

## 2022-12-27 DIAGNOSIS — Z7984 Long term (current) use of oral hypoglycemic drugs: Secondary | ICD-10-CM | POA: Insufficient documentation

## 2022-12-27 DIAGNOSIS — J4489 Other specified chronic obstructive pulmonary disease: Secondary | ICD-10-CM | POA: Insufficient documentation

## 2022-12-27 DIAGNOSIS — I5020 Unspecified systolic (congestive) heart failure: Secondary | ICD-10-CM | POA: Diagnosis not present

## 2022-12-27 LAB — COMPREHENSIVE METABOLIC PANEL
ALT: 26 U/L (ref 0–44)
AST: 32 U/L (ref 15–41)
Albumin: 3.9 g/dL (ref 3.5–5.0)
Alkaline Phosphatase: 100 U/L (ref 38–126)
Anion gap: 10 (ref 5–15)
BUN: 22 mg/dL (ref 8–23)
CO2: 28 mmol/L (ref 22–32)
Calcium: 8.8 mg/dL — ABNORMAL LOW (ref 8.9–10.3)
Chloride: 99 mmol/L (ref 98–111)
Creatinine, Ser: 1.22 mg/dL (ref 0.61–1.24)
GFR, Estimated: >60 mL/min (ref >60)
Glucose, Bld: 88 mg/dL (ref 70–99)
Potassium: 4.1 mmol/L (ref 3.5–5.1)
Sodium: 137 mmol/L (ref 135–145)
Total Bilirubin: 0.8 mg/dL (ref 0.3–1.2)
Total Protein: 7.2 g/dL (ref 6.5–8.1)

## 2022-12-27 LAB — POCT INR: INR: 2.4 (ref 2.0–3.0)

## 2022-12-27 LAB — BRAIN NATRIURETIC PEPTIDE: B Natriuretic Peptide: 189.6 pg/mL — ABNORMAL HIGH (ref 0.0–100.0)

## 2022-12-27 NOTE — Patient Instructions (Signed)
Description   Continue taking warfarin 1 tablet daily except 1/2 tablet on Mondays, Wednesdays, and Fridays.  Report to ER with any bleeding, falls, accidents, incidents.  Recheck INR in 4 weeks. [facility] [phone] or [phone]

## 2022-12-27 NOTE — Patient Instructions (Signed)
Medication Changes:  Take and extra 40 mg of Torsemide along with extra 20 meq of Potassium in the afternoon for today and tomorrow  Lab Work:  Labs done today, your results will be available in MyChart, we will contact you for abnormal readings.  Your physician recommends that you return for lab work in: 1-2 weeks, we have provided you with a prescription to have this done locally  Special Instructions // Education:  Do the following things EVERYDAY: Weigh yourself in the morning before breakfast. Write it down and keep it in a log. Take your medicines as prescribed Eat low salt foods--Limit salt (sodium) to 2000 mg per day.  Stay as active as you can everyday Limit all fluids for the day to less than 2 liters   Follow-Up in: 2 months   At the [facility], you and your health needs are our priority. We have a designated team specialized in the treatment of Heart Failure. This Care Team includes your primary Heart Failure Specialized Cardiologist (physician), Advanced Practice Providers (APPs- Physician Assistants and Nurse Practitioners), and Pharmacist who all work together to provide you with the care you need, when you need it.   You may see any of the following providers on your designated Care Team at your next follow up:  Dr. Arvilla Meres Dr. Marca Ancona Dr. Dorthula Nettles Dr. Theresia Bough Tonye Becket, NP Robbie Lis, [place] [facility] [place], [place] Brynda Peon, NP [place] Lee, NP Karle Plumber, PharmD   Please be sure to bring in all your medications bottles to every appointment.   Need to Contact us:  If you have any questions or concerns before your next appointment please send [place] a message through [place] or call our office at [phone].    TO LEAVE A MESSAGE FOR THE NURSE SELECT OPTION 2, PLEASE LEAVE A MESSAGE INCLUDING: YOUR NAME DATE OF BIRTH CALL BACK NUMBER REASON FOR CALL**this is important as we prioritize the call  backs  YOU WILL RECEIVE A CALL BACK THE SAME DAY AS LONG AS YOU CALL BEFORE 4:00 PM

## 2022-12-27 NOTE — Progress Notes (Addendum)
Advanced Heart Failure Clinic Note   Primary Care: Aliene Beams, MD HF Cardiologist: Dr. Gala Romney EP: Dr Elberta Fortis.   HPI: Ruben Murray is a 77 y.o.with HFrEF d/t NICM, permanent A fib, pulmonary HTN,  OSA, seizure d/o, COPD and HTN   Echo 2018 EF 40-45%  Lost to cardiology f/u 2022.    Admitted  2/23 for ADHF in setting of rapid AF. Echo EF 20-25%, RV mildly reduced, RVSP 48 mmHg, mild Ruben/TR. He was diuresed w/ IV Lasix. Afib treated w/ rate control, w/ metoprolol and digoxin.    He was seen by Dr. Elberta Fortis . Plan is for CRT-D implant w/ plans to consider AV node ablation if further issues w/ rapid afib.    Had initial TOC visit 05/04/2021. HF meds adjusted, but he did not start them.    Admitted 3/23 with AF RVR and A/C HFrEF.    Readmitted 5/23 with AFib with RVR, HR 150's, INR 3.8. CTA chest with small PE w/ R heart strain, also with volume overload. Echo with EF 20%. No DOAC due to hx of seizures and phenobarbital use.  AFib controlled with metoprolol 100 bid and digoxin (dig previously stopped due to elevated level). PMT consulted for GOC, and he remained DNR. He was discharged home with HH, weight 228 lbs.  S/P Abbott BiV ICDm12/23  Echo 4/24 EF 20-25%. RV ok.   Underwent AVN ablation with Dr. Elberta Fortis in 8/24  Recently diagnosed with OSA. Started CPAP.   [facility]  Today he returns for HF follow up with Paramedicine. Was seen in ER 11/29/22 for dark stools. Hgb 15.1 SOB with any activity. Gets around with Rollator and hangs onto th the wall. No falls in 6 months.  Sleeps in hospital bed at 45 degree angle. Appetite ok. No fever or chills. Weight at home 235-240  pounds. Taking all medications. He has neighbors to help him and take him to appointments.   Cardiac Testing  - Echo (5/23): EF 20%, RV moderately down, moderate to severe TR, mild Ruben (he was in AF and volume overloaded).  - Echo (2/23): EF 20-25%, RV mildly reduced, RVSP  48, mild Ruben, mild TR   - L/RHC (9/21): Normal coronaries, mild pulmonary hypertension; PA 34/22, mean PA pressure 28 mmHg   - Echo (8/21): EF 20-25%, RV moderately reduced, RVSP 38, Mild Ruben, mild TR  - Echo (1/20): EF 40-45%, RV mildly dilated, systolic fx normal   - Echo (8/19): EF 25%, RV moderately reduced (in Afib)  - Echo (2/18): EF 40-45%, RV normal, mod TR  Past Medical History:  Diagnosis Date   Acquired thrombophilia (HCC) 01/15/2019   Aortic atherosclerosis (HCC)    Aortic root dilatation (HCC)    CAP (community acquired pneumonia) 03/06/2016   Chronic laryngitis 10/03/2016   Chronic systolic CHF (congestive heart failure) (HCC)    COPD GOLD II  08/02/2016   Quit smoking 2000  PFT's  07/10/2016  FEV1 1.98 (70 % ) ratio 67  p 19 % improvement from saba p nothing  prior to study while of coreg so rec as of 08/02/2016 try off coreg and on bisoprolol      Essential hypertension 03/06/2016   Changed from coreg to bisoprolol due to copd with reversible component  08/02/2016 >>>    Hoarseness 08/31/2016   Referred to ent 08/31/2016 >>> seen 10/03/16 Jenne Pane dx gerd and rhinitis medicamentosa   > improved on f/u 01/31/17 on gerd rx/ flonase  and off afrin   Hyperlipidemia    Hypertension    Laryngopharyngeal reflux (LPR) 10/03/2016   Mitral regurgitation    Moderate persistent asthma 08/02/2016   Morbid (severe) obesity due to excess calories (HCC) 08/02/2016   Nasal polyps 10/03/2016   NICM (nonischemic cardiomyopathy) (HCC)    a. EF 40-45% in 04/2016, etiology not defined, managed medically.   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     busPIRone (BUSPAR) 5 MG tablet Take 5 mg by mouth 2 (two) times daily.     Carboxymethylcellulose Sod PF 0.5 % SOLN Place 1 drop into both eyes daily as needed (dry eyes).     Cholecalciferol (VITAMIN D3) 25 MCG (1000 UT) CAPS Take 1,000 Units by mouth daily.     empagliflozin (JARDIANCE) 10 MG TABS tablet Take 1 tablet (10 mg total) by mouth daily before breakfast. 90 tablet 3   escitalopram (LEXAPRO) 20 MG tablet Take 20 mg by mouth at bedtime.     finasteride (PROSCAR) 5 MG tablet Take 1 tablet (5 mg total) by mouth daily at 12 noon. 30 tablet 1   fluticasone (FLONASE) 50 MCG/ACT nasal spray Place 1 spray into both nostrils daily as needed for allergies or rhinitis.     loratadine (CLARITIN) 10 MG tablet Take 10 mg by mouth every morning.     losartan (COZAAR) 25 MG tablet Take 0.5 tablets (12.5 mg total) by mouth daily. 45 tablet 3   metoprolol succinate (TOPROL-XL) 100 MG 24 hr tablet TAKE 1 TABLET BY MOUTH 2 TIMES DAILY. TAKE WITH OR IMMEDIATELY FOLLOWING A MEAL. 180 tablet 3   Multiple Vitamin (MULTIVITAMIN WITH MINERALS) TABS tablet Take 1 tablet by mouth every morning.     PHENobarbital (LUMINAL) 97.2 MG tablet Take 97.2 mg by mouth every evening. Takes between 3:00-5:00pm.     potassium chloride SA (KLOR-CON M) 20 MEQ tablet Take 1 tablet (20 mEq total) by mouth daily. 90 tablet 3   pravastatin (PRAVACHOL) 40 MG tablet Take 40 mg by mouth at bedtime.      spironolactone (ALDACTONE) 25 MG tablet Take 0.5 tablets (12.5 mg total) by mouth daily. 45 tablet 2   torsemide (DEMADEX) 20 MG tablet Take 2 tablets (40 mg total) by mouth daily. 180 tablet 3   triamcinolone cream (KENALOG) 0.1 % Apply 1 application  topically 2 (two) times daily as needed (rash).     warfarin (COUMADIN) 5 MG tablet Take 2.5-5 mg by mouth See admin instructions. Take 2.5 mg on Monday and Friday, all the other days take 5 mg  at bedtime     No current facility-administered medications for this encounter.   Allergies  Allergen Reactions   Dilaudid [Hydromorphone Hcl] Other (See Comments)    Makes pt hyper    Hydromorphone Other (See Comments)    hyperactiviity Other reaction(s): made him wild Other reaction(s): Unknown   Morphine Sulfate Other (See Comments)    Other reaction(s): at high dose causes confusion   Tegretol [Carbamazepine] Hives  Carbamazepine Hives, Rash and Other (See Comments)    Other reaction(s): hives Other reaction(s): Hives Other reaction(s): hives   Social History   Socioeconomic History   Marital status: Married    Spouse name: Not on file   Number of children: Not on file   Years of education: Not on file   Highest education level: Not on file  Occupational History   Not on file  Tobacco Use   Smoking status: Former    Current packs/day: 0.00    Average packs/day: 3.0 packs/day for 48.0 years (144.0 ttl pk-yrs)    Types: Cigarettes    Start date: 23    Quit date: 2000    Years since quitting: 24.8   Smokeless tobacco: Never  Vaping Use   Vaping status: Never Used  Substance and Sexual Activity   Alcohol use: Never   Drug use: Never   Sexual activity: Never  Other Topics Concern   Not on file  Social History Narrative   ** Merged History Encounter **       Social Determinants of Health   Financial Resource Strain: Medium Risk (10/26/2021)   Overall Financial Resource Strain (CARDIA)    Difficulty of Paying Living Expenses: Somewhat hard  Food Insecurity: Low Risk  (07/21/2022)   [facility]   Hunger Vital Sign    Worried About Running Out of Food in the Last Year: Never true    Ran Out of Food in the Last Year: Never true  Transportation Needs: Unmet Transportation Needs (11/16/2022)   PRAPARE - Administrator, Civil Service (Medical): Yes    Lack of Transportation (Non-Medical): No  Physical Activity: Inactive  (12/15/2019)   Exercise Vital Sign    Days of Exercise per Week: 0 days    Minutes of Exercise per Session: 0 min  Stress: Stress Concern Present (09/26/2018)   [employer] of Occupational Health - Occupational Stress Questionnaire    Feeling of Stress : To some extent  Social Connections: Not on file  Intimate Partner Violence: Not At Risk (02/04/2022)   Humiliation, Afraid, Rape, and Kick questionnaire    Fear of Current or Ex-Partner: No    Emotionally Abused: No    Physically Abused: No    Sexually Abused: No   Family History  Problem Relation Age of Onset   Hypertension Mother    Other Father        sudden cardiac arrest   There were no vitals taken for this visit.  Wt Readings from Last 3 Encounters:  12/20/22 110.9 kg (244 lb 6.4 oz)  12/13/22 111.9 kg (246 lb 12.8 oz)  11/29/22 108.9 kg (240 lb)   PHYSICAL EXAM: General:  Elderly No resp difficulty HEENT: normal Neck: supple. JVP 5-6. Carotids 2+ bilat; no bruits. No lymphadenopathy or thryomegaly appreciated. Cor: PMI nondisplaced. Regular rate & rhythm. No rubs, gallops or murmurs. Lungs: clear Abdomen: obese soft, nontender, nondistended. No hepatosplenomegaly. No bruits or masses. Good bowel sounds. Extremities: no cyanosis, clubbing, rash, edema Neuro: alert & orientedx3, cranial nerves grossly intact. moves all 4 extremities w/o difficulty. Affect pleasant   ASSESSMENT & PLAN: 1. Chronic Systolic Heart Failure - NICM. Dates back to at least 2018, EFs have fluctuated from 20-40% - most recent echo 2/22 EF 20-25%, RV moderately reduced - LHC in 2021 showed normal coronaries - suspect tachy mediated CM from long history of chronic, poorly rate controlled Afib/flutter  - He does not tolerate rapid  Afib well, in setting of severe LV dysfunction  - S/P Abbott CRT-D  12/23 - Echo 06/21/22 EF 20-25%. RV ok  - NYHA IIIB-IV. Volume status looks ok. Given worsening SOB will try to increase torsemide for 2 days to  see if it helps. - Continue losartan 12.5 mg daily. Sherryll Burger cost-prohibitive) - Continue torsemide 40 mg daily. As above increase for 2 days - Continue digoxin 0.125 mg daily. - Continue Jardiance 10 mg daily. - Continue Toprol XL 100 mg bid. - Continue spiro 12.5 mg daily.  - He has end-stage CM. Hospice following, He is DNR/DNI. Will deactivate ICD today  - ICD interrogated today. Chronic AF. No VT. Volume ok to slightly elevated.    2. Permanent Atrial Fibrillation  - failed multiple cardioversions and AAD therapy . - issues w/ Rapid Afib in the past, complicating HF - Rate control w/ metoprolol and digoxin.  - Underwent AVN ablation with Dr. Elberta Fortis in 8/24. CRT-D in place - On chronic Coumadin, INRs managed by PCP.   3. PE, bilateral small sub-segmental - new on CTA 5/23 2/2 sub-therapeutic INR - No DOAC due to interaction with phenobarb. - On Coumadin.   4. Pulmonary Hypertension  - Suspect combination WHO Group 2 and 3 - PFTs in 2018 showed moderate OAD. Also w/ OSA    5. OSA  - No longer wearing CPAP, was told he didn't need it as he sleeps in recliner.   6. HTN - marginal but stable  7. COPD - Not on inhalers. - PFTs 2018 showed moderate obstructive disease.  8. Medication non-compliance - Much improved - Continue paramedicine, appreciate their assistance. Paramedicine accompanied patient today.  9. DNR/DNI - he is end of life. Will deactivate ICD.     Arvilla Meres, MD  8:25 AM

## 2022-12-27 NOTE — Progress Notes (Signed)
Paramedicine Encounter   Patient ID: Ruben Murray , male,   DOB: 1945/05/14,77 y.o.,  MRN: [id number]  Met with [name] in the clinic during his appointment with Lillia Abed and Bensimhon. Pt continually reiterated his frustrations with the status of his health and the quality of his life. Pt reported continued shortness of breath, but was informed that it was due to the strength of his heart. Pt understood and Bensimhon recommended deactivating his ICD. Pt was agreeable and procedure completed in office. Pt did not bring his medication to the office today so Had to follow up with him in his home. Pt's pill box was filled for 1x week. Will follow up next week.  Weight @ clinic - 248 lbs Weight @ home - 247.6 lbs B/P-102/74 P-75 SP02-92%  Last visit weight: 244 lbs   Med changes- extra 40 torsemide + 20 mEq x2 days - 10 days repeat lab   Recheck an Echo in 2x months + App Follow up.   Deactivate ICD   Continue following with palliative care   Benson Setting EMT-P Community Paramedic  [phone]

## 2022-12-28 ENCOUNTER — Telehealth (HOSPITAL_COMMUNITY): Payer: Self-pay | Admitting: Emergency Medicine

## 2022-12-28 NOTE — Telephone Encounter (Signed)
[name] contacted me and advised that she wanted hospice to reevaluate Ruben Murray. She reported that with the summary of Dr. Prescott Gum visit and Dr. Nolon Stalls, she felt like he qualified, though he had recently been advised that he did not qualify.   Pt contacted [facility] to reevaluate on same.   Will follow up next week.   Benson Setting EMT-P Community Paramedic  [phone]

## 2023-01-03 ENCOUNTER — Other Ambulatory Visit (HOSPITAL_COMMUNITY): Payer: Self-pay | Admitting: Emergency Medicine

## 2023-01-03 ENCOUNTER — Telehealth (HOSPITAL_COMMUNITY): Payer: Self-pay | Admitting: Emergency Medicine

## 2023-01-03 NOTE — Telephone Encounter (Signed)
[name] contacted me advising that Mr. Gucciardo has contracted Bed Bugs. She advised that they are actively trying to get rid of them. Will continue to follow up.  Benson Setting EMT-P Community Paramedic  [phone]

## 2023-01-03 NOTE — Progress Notes (Signed)
Paramedicine Encounter    Patient ID: Ruben Murray, male    DOB: 15-Mar-1945, 77 y.o.   MRN: [id number]   Complaints - dark, loose stools   Assessment - no pedal edema, lung sounds clear.   Compliance with meds - no missed doses   Pill box filled - for 1x week  Refills needed - spironolactone, tylenol, multivitamin   Meds changes since last visit - none    Social changes - none    VISIT SUMMARY**  Met with Ruben Murray in the home today. Pt remained in the bed for the duration of the visit. Pt reported that he was too tired to clean his apartment. Pt was assessed to find no acute abnormalities. I revisited the conversation about Hospice. Pt seemed unsure of his goals. Pt mentioned the option to go to "a home". Pt was advised that I would attend his hospice consult on Monday and then reevaluate that avenue if he decides to go in that direction. Pt was agreeable to same and grateful for my participation. Pt's pill box was filled for 1x week. Upcoming appointments were reviewed and calendar updated. Will follow up in 1x week.  BP 102/68   Pulse 72   Resp 16   Wt 245 lb (111.1 kg)   SpO2 96%   BMI 37.25 kg/m  Weight yesterday-246.4 lbs Last visit weight-247.6 lbs  Benson Setting EMT-P Community Paramedic  [phone]     ACTION: Home visit completed     Patient Care Team: Aliene Beams, MD as PCP - General (Family Medicine) Jake Bathe, MD as PCP - Cardiology (Cardiology) Regan Lemming, MD as PCP - Electrophysiology (Cardiology) Aliene Beams, MD (Family Medicine) [place] as Case Manager (Hospice and Palliative Medicine)  Patient Active Problem List   Diagnosis Date Noted   Heart failure with reduced ejection fraction (HCC) 11/15/2022   CHF (congestive heart failure) (HCC) 02/03/2022   Atrial fibrillation (HCC) 11/02/2021   Long term (current) use of anticoagulants 11/02/2021   AKI (acute kidney injury) (HCC) 07/27/2021   Acute  pulmonary embolism (HCC) 07/26/2021   Acute on chronic systolic CHF (congestive heart failure) (HCC) 05/17/2021   Hypercoagulable state due to permanent atrial fibrillation (HCC) 05/10/2021   Left leg pain 04/24/2021   OSA on CPAP 04/24/2021   Mitral regurgitation 04/24/2021   Tricuspid regurgitation 04/24/2021   Dilated aortic root (HCC) 04/24/2021   Permanent atrial fibrillation (HCC) 04/19/2021   Chronic arthropathy 04/13/2020   Callus 04/13/2020   Onychomycosis 12/12/2019   Left leg cellulitis 12/12/2019   Chronic systolic heart failure (HCC) 12/09/2019   Dilated cardiomyopathy (HCC)    Atrial flutter (HCC) 11/03/2019   Atrial flutter with rapid ventricular response (HCC) 11/03/2019   Acquired thrombophilia (HCC) 01/15/2019   Atrial fibrillation with rapid ventricular response (HCC) 10/29/2017   Chronic combined systolic and diastolic heart failure (HCC) 10/29/2017   Prolonged QT interval 10/29/2017   Sensorineural hearing loss (SNHL) of both ears 01/31/2017   Dysphonia 10/03/2016   Laryngopharyngeal reflux (LPR) 10/03/2016   Nasal polyps 10/03/2016   Rhinitis medicamentosa 10/03/2016   Chronic laryngitis 10/03/2016   Hoarseness 08/31/2016   Moderate persistent asthma 08/02/2016   COPD (chronic obstructive pulmonary disease) (HCC) 08/02/2016   Morbid (severe) obesity due to excess calories (HCC) 08/02/2016   Community acquired pneumonia 03/06/2016   Essential hypertension 03/06/2016   Mixed hyperlipidemia 03/06/2016   Right thyroid nodule 03/06/2016   Seizures (HCC) 03/06/2016   Fever 03/12/2015  Recurrent erosion of cornea 06/23/2011    Current Outpatient Medications:    acetaminophen (TYLENOL) 500 MG tablet, Take 1,000 mg by mouth 2 (two) times daily at 8 am and 10 pm., Disp: , Rfl:    busPIRone (BUSPAR) 5 MG tablet, Take 5 mg by mouth 2 (two) times daily., Disp: , Rfl:    Carboxymethylcellulose Sod PF 0.5 % SOLN, Place 1 drop into both eyes daily as needed (dry  eyes)., Disp: , Rfl:    Cholecalciferol (VITAMIN D3) 25 MCG (1000 UT) CAPS, Take 1,000 Units by mouth daily., Disp: , Rfl:    empagliflozin (JARDIANCE) 10 MG TABS tablet, Take 1 tablet (10 mg total) by mouth daily before breakfast., Disp: 90 tablet, Rfl: 3   escitalopram (LEXAPRO) 20 MG tablet, Take 20 mg by mouth at bedtime., Disp: , Rfl:    finasteride (PROSCAR) 5 MG tablet, Take 1 tablet (5 mg total) by mouth daily at 12 noon., Disp: 30 tablet, Rfl: 1   fluticasone (FLONASE) 50 MCG/ACT nasal spray, Place 1 spray into both nostrils daily as needed for allergies or rhinitis., Disp: , Rfl:    loratadine (CLARITIN) 10 MG tablet, Take 10 mg by mouth every morning., Disp: , Rfl:    losartan (COZAAR) 25 MG tablet, Take 0.5 tablets (12.5 mg total) by mouth daily., Disp: 45 tablet, Rfl: 3   metoprolol succinate (TOPROL-XL) 100 MG 24 hr tablet, TAKE 1 TABLET BY MOUTH 2 TIMES DAILY. TAKE WITH OR IMMEDIATELY FOLLOWING A MEAL., Disp: 180 tablet, Rfl: 3   Multiple Vitamin (MULTIVITAMIN WITH MINERALS) TABS tablet, Take 1 tablet by mouth every morning., Disp: , Rfl:    PHENobarbital (LUMINAL) 97.2 MG tablet, Take 97.2 mg by mouth every evening. Takes between 3:00-5:00pm., Disp: , Rfl:    potassium chloride SA (KLOR-CON M) 20 MEQ tablet, Take 1 tablet (20 mEq total) by mouth daily., Disp: 90 tablet, Rfl: 3   pravastatin (PRAVACHOL) 40 MG tablet, Take 40 mg by mouth at bedtime. , Disp: , Rfl:    spironolactone (ALDACTONE) 25 MG tablet, Take 0.5 tablets (12.5 mg total) by mouth daily., Disp: 45 tablet, Rfl: 2   torsemide (DEMADEX) 20 MG tablet, Take 2 tablets (40 mg total) by mouth daily., Disp: 180 tablet, Rfl: 3   triamcinolone cream (KENALOG) 0.1 %, Apply 1 application  topically 2 (two) times daily as needed (rash)., Disp: , Rfl:    warfarin (COUMADIN) 5 MG tablet, Take 2.5-5 mg by mouth See admin instructions. Take 2.5 mg on Monday and Friday, all the other days take 5 mg at bedtime, Disp: , Rfl:  Allergies   Allergen Reactions   Dilaudid [Hydromorphone Hcl] Other (See Comments)    Makes pt hyper    Hydromorphone Other (See Comments)    hyperactiviity Other reaction(s): made him wild Other reaction(s): Unknown   Morphine Sulfate Other (See Comments)    Other reaction(s): at high dose causes confusion   Tegretol [Carbamazepine] Hives   Carbamazepine Hives, Rash and Other (See Comments)    Other reaction(s): hives Other reaction(s): Hives Other reaction(s): hives     Social History   Socioeconomic History   Marital status: Married    Spouse name: Not on file   Number of children: Not on file   Years of education: Not on file   Highest education level: Not on file  Occupational History   Not on file  Tobacco Use   Smoking status: Former    Current packs/day: 0.00    Average  packs/day: 3.0 packs/day for 48.0 years (144.0 ttl pk-yrs)    Types: Cigarettes    Start date: 46    Quit date: 2000    Years since quitting: 24.8   Smokeless tobacco: Never  Vaping Use   Vaping status: Never Used  Substance and Sexual Activity   Alcohol use: Never   Drug use: Never   Sexual activity: Never  Other Topics Concern   Not on file  Social History Narrative   ** Merged History Encounter **       Social Determinants of Health   Financial Resource Strain: Medium Risk (10/26/2021)   Overall Financial Resource Strain (CARDIA)    Difficulty of Paying Living Expenses: Somewhat hard  Food Insecurity: Low Risk  (07/21/2022)   [facility]   Hunger Vital Sign    Worried About Running Out of Food in the Last Year: Never true    Ran Out of Food in the Last Year: Never true  Transportation Needs: Unmet Transportation Needs (11/16/2022)   PRAPARE - Administrator, Civil Service (Medical): Yes    Lack of Transportation (Non-Medical): No  Physical Activity: Inactive (12/15/2019)   Exercise Vital Sign    Days of Exercise per Week: 0 days    Minutes of  Exercise per Session: 0 min  Stress: Stress Concern Present (09/26/2018)   [employer] of Occupational Health - Occupational Stress Questionnaire    Feeling of Stress : To some extent  Social Connections: Not on file  Intimate Partner Violence: Not At Risk (02/04/2022)   Humiliation, Afraid, Rape, and Kick questionnaire    Fear of Current or Ex-Partner: No    Emotionally Abused: No    Physically Abused: No    Sexually Abused: No    Physical Exam      Future Appointments  Date Time Provider Department Center  01/24/2023  2:00 PM [facility] None  02/21/2023  1:30 PM [facility] PA/NP [facility] None  02/23/2023  7:00 AM [facility] DEVICE REMOTES [facility]  02/23/2023 11:45 AM Lenn Sink, DPM [facility]  03/02/2023 12:15 PM Elberta Fortis, Andree Coss, MD [facility]  05/25/2023  7:00 AM [facility] DEVICE REMOTES [facility]  08/24/2023  7:00 AM [facility] DEVICE REMOTES [facility]

## 2023-01-08 ENCOUNTER — Other Ambulatory Visit (HOSPITAL_COMMUNITY): Payer: Self-pay | Admitting: Emergency Medicine

## 2023-01-08 NOTE — Progress Notes (Signed)
Present at a meeting with [facility]. With me are Marylene Land, RN, Nurse liaison and Toney Reil, LPN to assist with on-boarding the patient. I called [name] and put her on speaker so she could remotely attend the meeting. I assisted with going through pt's current medication list and resources he already had in place. We went through specialities he was involved in and his wishes. Marylene Land advise no further visits necessary through the coumadin clinic or the [facility], however he could continue appointments to the device clinic as he felt appropriate.   Mr. Space has been admitted into hospice care through [facility] [place].   Marylene Land contacted the MD at the facility. Dr. Ollen Gross to review hospice covered and continued medications.   Spironolactone - covered Phenobarbitol - continue, not covered Multivitamin - unrelated.  Claritin - unrelated Lexapro - covered  Buspar - covered Torsemide - covered Potassium - covered Losartan - covered Warfarin - covered  Metoprolol - covered.  Pravastatin - finish and stop  Finasteride - finish and stop  Jardiance - finish and stop Vitamin D - stop  Tylenol - covered - 1 - 500mg  Q4hr PRN   Add  Oxycodone 5 mg Q4 hours PRN - for pain greater than tylenol.  Ativan 0.5 mg Q8 hours for anxiety and breathlessness  Zofran 4 mg Q4 hours PRN  Trazadone 25mg  Q6 hours for agitation.   Temp: 97.2 P - 74  R - 20  O2 - 98%  As soon as Mr. Demuro gets his hospice nurse assigned, I will contact her by phone to follow up to coordinate transfer of care. Will reach out to HF staff of this transfer once confirmed with hospice nurse.   Benson Setting EMT-P Community Paramedic  [phone]

## 2023-01-10 ENCOUNTER — Other Ambulatory Visit (HOSPITAL_COMMUNITY): Payer: Self-pay | Admitting: Emergency Medicine

## 2023-01-10 NOTE — Progress Notes (Signed)
Patient is now discharged from [facility].  Patient has/has not met the following goals:  Yes :Patient expresses basic understanding of medications and what they are for Yes :Patient able to verbalize heart failure specific dietary/fluid restrictions Yes :Patient is aware of who to call if they have medical concerns or if they need to schedule or change appts Yes :Patient has a scale for daily weights and weighs regularly Yes :Patient able to verbalize concerning symptoms when they should call the HF clinic (weight gain ranges, etc) Yes :Patient has a PCP and has seen within the past year or has upcoming appt Yes :Patient has reliable access to getting their medications Yes :Patient has shown they are able to reorder medications reliably No :Patient has had admission in past 30 days- if yes how many? No :Patient has had admission in past 90 days- if yes how many?  Discharge Comments: Ruben Murray was admitted into [facility]. Met with Pt's Hospice Nurse Ruben Laundry, RN. Medication reviewed and care transferred   [place]enson Setting EMT-P Community Paramedic  [phone]

## 2023-01-23 ENCOUNTER — Encounter: Payer: Self-pay | Admitting: Internal Medicine

## 2023-01-29 ENCOUNTER — Encounter: Payer: No Typology Code available for payment source | Admitting: Cardiology

## 2023-02-21 ENCOUNTER — Encounter (HOSPITAL_COMMUNITY): Payer: No Typology Code available for payment source

## 2023-02-23 ENCOUNTER — Ambulatory Visit: Payer: No Typology Code available for payment source | Admitting: Podiatry

## 2023-02-23 ENCOUNTER — Ambulatory Visit (INDEPENDENT_AMBULATORY_CARE_PROVIDER_SITE_OTHER)

## 2023-02-23 DIAGNOSIS — I428 Other cardiomyopathies: Secondary | ICD-10-CM

## 2023-02-23 LAB — CUP PACEART REMOTE DEVICE CHECK
Battery Remaining Longevity: 64 mo
Battery Remaining Percentage: 81 %
Battery Voltage: 2.98 V
Date Time Interrogation Session: 20241220010017
HighPow Impedance: 69 Ohm
Implantable Lead Connection Status: 753985
Implantable Lead Connection Status: 753985
Implantable Lead Connection Status: 753985
Implantable Lead Implant Date: 20231201
Implantable Lead Implant Date: 20231201
Implantable Lead Implant Date: 20231201
Implantable Lead Location: 753858
Implantable Lead Location: 753859
Implantable Lead Location: 753860
Implantable Pulse Generator Implant Date: 20231201
Lead Channel Impedance Value: 440 Ohm
Lead Channel Impedance Value: 440 Ohm
Lead Channel Impedance Value: 880 Ohm
Lead Channel Pacing Threshold Amplitude: 0.75 V
Lead Channel Pacing Threshold Amplitude: 2 V
Lead Channel Pacing Threshold Pulse Width: 0.4 ms
Lead Channel Pacing Threshold Pulse Width: 1 ms
Lead Channel Sensing Intrinsic Amplitude: 0.8 mV
Lead Channel Sensing Intrinsic Amplitude: 11.7 mV
Lead Channel Setting Pacing Amplitude: 1.75 V
Lead Channel Setting Pacing Amplitude: 2.5 V
Lead Channel Setting Pacing Pulse Width: 0.4 ms
Lead Channel Setting Pacing Pulse Width: 1 ms
Lead Channel Setting Sensing Sensitivity: 0.5 mV
Pulse Gen Serial Number: 211010361
Zone Setting Status: 755011

## 2023-02-26 ENCOUNTER — Telehealth: Payer: Self-pay | Admitting: Cardiology

## 2023-02-26 NOTE — Telephone Encounter (Signed)
Patient is calling stating he is now under hospice care at his home and all of his appointments have been done through them currently. Due to this he is wanting to know if he still needs to attend his appointment 12/27. He states he has transportation that can bring him if need be.   Please advise.

## 2023-03-02 ENCOUNTER — Ambulatory Visit: Payer: No Typology Code available for payment source | Admitting: Cardiology

## 2023-03-02 NOTE — Telephone Encounter (Signed)
Late entry: Spoke to pt first thing this morning around 8:15 am. Aware w/ hospice care he did not need to come in for appt, per Dr. Elberta Fortis. Pt agreeable to letting us know if things change. ICD therapies have already been turned off. Pt agreeable to plan.

## 2023-03-27 NOTE — Progress Notes (Signed)
Remote ICD transmission.   

## 2023-03-28 ENCOUNTER — Telehealth: Payer: Self-pay | Admitting: Cardiology

## 2023-03-28 NOTE — Telephone Encounter (Signed)
Spoke with [employer] (on DPR) and she states the pt has gotten a little worse, has another case of bed bugs, and that Hospice has been taken care of him and they are reporting INR to pt's Hospice Doctor. She states he will continue with the Hospice care doctor and treatment they are providing in the home. She was appreciative of the call.

## 2023-03-28 NOTE — Telephone Encounter (Signed)
Spoke with [employer] who reports she had received a message from [facility]. [name] advised pt's INR was 8 yesterday but being taken care of.  Also reports pt is "doing a little worse" now. [name] aware I will forward this information to [place] for her knowledge.

## 2023-03-28 NOTE — Telephone Encounter (Signed)
Caller [facility]) stated she is returning RN's call.

## 2023-05-25 ENCOUNTER — Ambulatory Visit (INDEPENDENT_AMBULATORY_CARE_PROVIDER_SITE_OTHER)

## 2023-05-25 DIAGNOSIS — I428 Other cardiomyopathies: Secondary | ICD-10-CM | POA: Diagnosis not present

## 2023-05-25 LAB — CUP PACEART REMOTE DEVICE CHECK
Battery Remaining Longevity: 62 mo
Battery Remaining Percentage: 77 %
Battery Voltage: 2.98 V
Date Time Interrogation Session: 20250321030032
HighPow Impedance: 74 Ohm
Implantable Lead Connection Status: 753985
Implantable Lead Connection Status: 753985
Implantable Lead Connection Status: 753985
Implantable Lead Implant Date: 20231201
Implantable Lead Implant Date: 20231201
Implantable Lead Implant Date: 20231201
Implantable Lead Location: 753858
Implantable Lead Location: 753859
Implantable Lead Location: 753860
Implantable Pulse Generator Implant Date: 20231201
Lead Channel Impedance Value: 430 Ohm
Lead Channel Impedance Value: 440 Ohm
Lead Channel Impedance Value: 850 Ohm
Lead Channel Pacing Threshold Amplitude: 0.75 V
Lead Channel Pacing Threshold Amplitude: 2.875 V
Lead Channel Pacing Threshold Pulse Width: 0.4 ms
Lead Channel Pacing Threshold Pulse Width: 1 ms
Lead Channel Sensing Intrinsic Amplitude: 0.8 mV
Lead Channel Sensing Intrinsic Amplitude: 11.7 mV
Lead Channel Setting Pacing Amplitude: 1.75 V
Lead Channel Setting Pacing Amplitude: 3.375
Lead Channel Setting Pacing Pulse Width: 0.4 ms
Lead Channel Setting Pacing Pulse Width: 1 ms
Lead Channel Setting Sensing Sensitivity: 0.5 mV
Pulse Gen Serial Number: 211010361
Zone Setting Status: 755011

## 2023-07-03 NOTE — Progress Notes (Signed)
 Remote ICD transmission.

## 2023-07-04 ENCOUNTER — Other Ambulatory Visit: Payer: Self-pay | Admitting: Cardiology

## 2023-07-17 DIAGNOSIS — H524 Presbyopia: Secondary | ICD-10-CM | POA: Diagnosis not present

## 2023-08-22 ENCOUNTER — Other Ambulatory Visit (HOSPITAL_COMMUNITY): Payer: Self-pay | Admitting: Internal Medicine

## 2023-08-24 ENCOUNTER — Ambulatory Visit: Payer: Self-pay | Admitting: Cardiology

## 2023-08-24 ENCOUNTER — Ambulatory Visit (INDEPENDENT_AMBULATORY_CARE_PROVIDER_SITE_OTHER)

## 2023-08-24 DIAGNOSIS — I428 Other cardiomyopathies: Secondary | ICD-10-CM

## 2023-08-24 LAB — CUP PACEART REMOTE DEVICE CHECK
Battery Remaining Longevity: 58 mo
Battery Remaining Percentage: 73 %
Battery Voltage: 2.98 V
Date Time Interrogation Session: 20250620020043
HighPow Impedance: 70 Ohm
Implantable Lead Connection Status: 753985
Implantable Lead Connection Status: 753985
Implantable Lead Connection Status: 753985
Implantable Lead Implant Date: 20231201
Implantable Lead Implant Date: 20231201
Implantable Lead Implant Date: 20231201
Implantable Lead Location: 753858
Implantable Lead Location: 753859
Implantable Lead Location: 753860
Implantable Pulse Generator Implant Date: 20231201
Lead Channel Impedance Value: 440 Ohm
Lead Channel Impedance Value: 440 Ohm
Lead Channel Impedance Value: 840 Ohm
Lead Channel Pacing Threshold Amplitude: 0.625 V
Lead Channel Pacing Threshold Amplitude: 2.25 V
Lead Channel Pacing Threshold Pulse Width: 0.4 ms
Lead Channel Pacing Threshold Pulse Width: 1 ms
Lead Channel Sensing Intrinsic Amplitude: 0.8 mV
Lead Channel Sensing Intrinsic Amplitude: 11.7 mV
Lead Channel Setting Pacing Amplitude: 1.625
Lead Channel Setting Pacing Amplitude: 2.75 V
Lead Channel Setting Pacing Pulse Width: 0.4 ms
Lead Channel Setting Pacing Pulse Width: 1 ms
Lead Channel Setting Sensing Sensitivity: 0.5 mV
Pulse Gen Serial Number: 211010361
Zone Setting Status: 755011

## 2023-09-11 MED ORDER — TORSEMIDE 20 MG PO TABS: ORAL_TABLET | ORAL | 0 refills | Status: AC

## 2023-10-22 NOTE — Progress Notes (Signed)
 Remote ICD transmission.

## 2023-11-23 ENCOUNTER — Ambulatory Visit (INDEPENDENT_AMBULATORY_CARE_PROVIDER_SITE_OTHER)

## 2023-11-23 DIAGNOSIS — I5020 Unspecified systolic (congestive) heart failure: Secondary | ICD-10-CM | POA: Diagnosis not present

## 2023-11-26 ENCOUNTER — Ambulatory Visit: Payer: Self-pay | Admitting: Cardiology

## 2023-11-26 LAB — CUP PACEART REMOTE DEVICE CHECK
Battery Remaining Longevity: 66 mo
Battery Remaining Percentage: 70 %
Battery Voltage: 2.98 V
Date Time Interrogation Session: 20250919145657
HighPow Impedance: 74 Ohm
Implantable Lead Connection Status: 753985
Implantable Lead Connection Status: 753985
Implantable Lead Connection Status: 753985
Implantable Lead Implant Date: 20231201
Implantable Lead Implant Date: 20231201
Implantable Lead Implant Date: 20231201
Implantable Lead Location: 753858
Implantable Lead Location: 753859
Implantable Lead Location: 753860
Implantable Pulse Generator Implant Date: 20231201
Lead Channel Impedance Value: 430 Ohm
Lead Channel Impedance Value: 440 Ohm
Lead Channel Impedance Value: 840 Ohm
Lead Channel Pacing Threshold Amplitude: 0.625 V
Lead Channel Pacing Threshold Amplitude: 2.25 V
Lead Channel Pacing Threshold Pulse Width: 0.4 ms
Lead Channel Pacing Threshold Pulse Width: 1 ms
Lead Channel Sensing Intrinsic Amplitude: 0.8 mV
Lead Channel Sensing Intrinsic Amplitude: 11 mV
Lead Channel Setting Pacing Amplitude: 1.625
Lead Channel Setting Pacing Amplitude: 2.75 V
Lead Channel Setting Pacing Pulse Width: 0.4 ms
Lead Channel Setting Pacing Pulse Width: 1 ms
Lead Channel Setting Sensing Sensitivity: 0.5 mV
Pulse Gen Serial Number: 211010361
Zone Setting Status: 755011

## 2023-11-27 NOTE — Progress Notes (Signed)
Remote ICD Transmission.

## 2024-01-14 ENCOUNTER — Other Ambulatory Visit (HOSPITAL_COMMUNITY): Payer: Self-pay

## 2024-02-22 ENCOUNTER — Encounter

## 2024-05-23 ENCOUNTER — Encounter

## 2024-08-22 ENCOUNTER — Encounter

## 2024-11-21 ENCOUNTER — Encounter
